# Patient Record
Sex: Female | Born: 1980 | Race: White | Hispanic: No | Marital: Married | Smoking: Never smoker
Health system: Southern US, Community
[De-identification: ages and names within clinical notes are randomized; demographics above are authoritative.]

## PROBLEM LIST (undated history)

## (undated) DIAGNOSIS — R339 Retention of urine, unspecified: Secondary | ICD-10-CM

## (undated) DIAGNOSIS — F32A Depression, unspecified: Secondary | ICD-10-CM

## (undated) DIAGNOSIS — T8859XA Other complications of anesthesia, initial encounter: Secondary | ICD-10-CM

## (undated) DIAGNOSIS — N319 Neuromuscular dysfunction of bladder, unspecified: Secondary | ICD-10-CM

## (undated) DIAGNOSIS — R519 Headache, unspecified: Secondary | ICD-10-CM

## (undated) DIAGNOSIS — H539 Unspecified visual disturbance: Secondary | ICD-10-CM

## (undated) DIAGNOSIS — R42 Dizziness and giddiness: Secondary | ICD-10-CM

## (undated) DIAGNOSIS — R634 Abnormal weight loss: Secondary | ICD-10-CM

## (undated) DIAGNOSIS — N12 Tubulo-interstitial nephritis, not specified as acute or chronic: Secondary | ICD-10-CM

## (undated) DIAGNOSIS — T4145XA Adverse effect of unspecified anesthetic, initial encounter: Secondary | ICD-10-CM

## (undated) DIAGNOSIS — N289 Disorder of kidney and ureter, unspecified: Secondary | ICD-10-CM

## (undated) DIAGNOSIS — G43909 Migraine, unspecified, not intractable, without status migrainosus: Secondary | ICD-10-CM

## (undated) DIAGNOSIS — K909 Intestinal malabsorption, unspecified: Secondary | ICD-10-CM

## (undated) DIAGNOSIS — R51 Headache: Secondary | ICD-10-CM

## (undated) HISTORY — PX: BLADDER REMOVAL: SHX567

## (undated) HISTORY — DX: Headache, unspecified: R51.9

## (undated) HISTORY — DX: Unspecified visual disturbance: H53.9

## (undated) HISTORY — DX: Headache: R51

## (undated) HISTORY — PX: TONSILLECTOMY: SUR1361

## (undated) HISTORY — PX: APPENDECTOMY: SHX54

## (undated) HISTORY — PX: REVISION UROSTOMY CUTANEOUS: SUR1282

## (undated) HISTORY — PX: HIP SURGERY: SHX245

---
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
## 2006-10-25 ENCOUNTER — Inpatient Hospital Stay: Payer: Self-pay

## 2007-03-25 ENCOUNTER — Ambulatory Visit: Payer: Self-pay | Admitting: Unknown Physician Specialty

## 2007-03-26 ENCOUNTER — Ambulatory Visit: Payer: Self-pay | Admitting: Urology

## 2007-08-19 ENCOUNTER — Ambulatory Visit: Payer: Self-pay | Admitting: Urology

## 2008-08-04 ENCOUNTER — Ambulatory Visit: Payer: Self-pay

## 2008-08-31 ENCOUNTER — Observation Stay: Payer: Self-pay

## 2008-09-11 ENCOUNTER — Inpatient Hospital Stay: Payer: Self-pay

## 2009-02-15 ENCOUNTER — Ambulatory Visit: Payer: Self-pay | Admitting: Urology

## 2009-06-21 ENCOUNTER — Ambulatory Visit: Payer: Self-pay | Admitting: Urology

## 2010-06-20 ENCOUNTER — Ambulatory Visit: Payer: Self-pay | Admitting: Urology

## 2012-04-25 ENCOUNTER — Observation Stay: Payer: Self-pay

## 2012-04-27 ENCOUNTER — Inpatient Hospital Stay: Payer: Self-pay

## 2012-04-28 LAB — HEMATOCRIT: HCT: 31.7 % — ABNORMAL LOW (ref 35.0–47.0)

## 2014-08-25 NOTE — H&P (Signed)
L&D Evaluation:  History Expanded:   HPI 34 yo G3P2002, presents at 5843w1d with c/o contractions, EDD of 04/26/12 per 1st trimester US. [facility] notable for early entry to care, [facility] resolved at 26 weeks, elevated 1 hr with normal 3 hr. H/o SVD x 2 at term (10/2006: 7#13 oz, 08/2008: 8 #)    Blood Type (Maternal) A positive    Group B Strep Results Maternal (Result >5wks must be treated as unknown) negative    Maternal HIV Negative    Maternal Syphilis Ab Nonreactive    Maternal Varicella Immune    Rubella Results (Maternal) immune    Maternal T-Dap Immune    Patient's Medical History calculus of kidney, renal colic, UTI    Patient's Surgical History tonsillectomy, knee surgery, laparoscopy    Medications Pre Natal Vitamins  Zantac    Allergies ASA, Advil    Social History none    Family History Non-Contributory   ROS:   ROS see HPI   Exam:   Vital Signs stable    General no apparent distress    Mental Status clear    Chest clear    Heart no murmur/gallop/rubs    Abdomen gravid, tender with contractions    Estimated Fetal Weight Average for gestational age, EFW 7 1/2-8lbs    Edema no edema    Pelvic no external lesions, 3/100/-1, bulging membranes    Mebranes Intact    FHT normal rate with no decels    Ucx regular, q 3-5 minutes    Skin dry   Impression:   Impression early labor   Plan:   Comments Admission for labor Pt desires to go without epidural as prior epidurals did not work well.  Encourage ambulation in hallways, will continue to monitor.   Electronic Signatures: Vella KohlerBrothers, Carlie Corpus K (CNM)  (Signed 11-Jan-14 16:25)  Authored: L&D Evaluation   Last Updated: 11-Jan-14 16:25 by Vella KohlerBrothers, Clydene Burack K (CNM)

## 2015-03-10 DIAGNOSIS — G43909 Migraine, unspecified, not intractable, without status migrainosus: Secondary | ICD-10-CM | POA: Insufficient documentation

## 2015-03-14 DIAGNOSIS — M6281 Muscle weakness (generalized): Secondary | ICD-10-CM | POA: Insufficient documentation

## 2015-03-29 DIAGNOSIS — R197 Diarrhea, unspecified: Secondary | ICD-10-CM | POA: Insufficient documentation

## 2015-03-29 DIAGNOSIS — Z87898 Personal history of other specified conditions: Secondary | ICD-10-CM | POA: Insufficient documentation

## 2015-03-29 DIAGNOSIS — R634 Abnormal weight loss: Secondary | ICD-10-CM | POA: Insufficient documentation

## 2015-04-28 ENCOUNTER — Encounter: Payer: Self-pay | Admitting: Neurology

## 2015-04-28 ENCOUNTER — Ambulatory Visit (INDEPENDENT_AMBULATORY_CARE_PROVIDER_SITE_OTHER): Payer: 59 | Admitting: Neurology

## 2015-04-28 VITALS — BP 116/80 | HR 70 | Resp 12 | Ht 67.0 in | Wt 138.0 lb

## 2015-04-28 DIAGNOSIS — G43909 Migraine, unspecified, not intractable, without status migrainosus: Secondary | ICD-10-CM | POA: Diagnosis not present

## 2015-04-28 DIAGNOSIS — R5383 Other fatigue: Secondary | ICD-10-CM | POA: Diagnosis not present

## 2015-04-28 DIAGNOSIS — G43109 Migraine with aura, not intractable, without status migrainosus: Secondary | ICD-10-CM | POA: Insufficient documentation

## 2015-04-28 DIAGNOSIS — R9089 Other abnormal findings on diagnostic imaging of central nervous system: Secondary | ICD-10-CM | POA: Insufficient documentation

## 2015-04-28 DIAGNOSIS — R930 Abnormal findings on diagnostic imaging of skull and head, not elsewhere classified: Secondary | ICD-10-CM

## 2015-04-28 MED ORDER — VERAPAMIL HCL ER 180 MG PO CP24: 180.0000 mg | ORAL_CAPSULE | Freq: Every day | ORAL | Status: DC

## 2015-04-28 MED ORDER — RIZATRIPTAN BENZOATE 5 MG PO TBDP: 5.0000 mg | ORAL_TABLET | ORAL | Status: DC | PRN

## 2015-04-28 MED ORDER — ONDANSETRON 4 MG PO TBDP: ORAL_TABLET | ORAL | Status: DC

## 2015-04-28 NOTE — Patient Instructions (Signed)
You will be called to schedule the echocardiogram with bubble contrast  We will call you in a few days with the results of the blood work.

## 2015-04-28 NOTE — Progress Notes (Signed)
[facility]  PATIENT: Christine Chavez DOB: 1980-05-30  REFERRING DOCTOR OR PCP:     Dr. Liliane Channel ([place] Fax [phone]) SOURCE:   patient, MRI images on CD and records from [facility].  _________________________________   HISTORICAL  CHIEF COMPLAINT:  Chief Complaint  Patient presents with  . Numbness    Christine Chavez is here with her husband Eddie Dibbles to discuss mri result.  Sts. on 03-05-15 she had sudden onset of heaviness in bilat arms/legs.  Sts. episode lasted for about 45 min.  She then developed h/a, nausea--sought tx. at [facility] in [place].  Sts. she has had migraines for about 8 yrs.  Had never been on a daily med and had only taking otc meds for relief.  Since this episode, she has been started on Imitrex and Fioricet for h/a's.  She had a CT head in the ED to r/o CVA, and sts. that was ok.  She   . Headache    has since had an MRI brain and sts. was told it showed nonspecific changes, and was referred to Neuro to discuss further.  Sts. currently feels fatigue, intermittent sharp stabbing pain bilat upper arms, intermittent dizziness, poor appetitie (has lost 15 lbs).  Has had one episode of numbness from the waist down that last just a few minutes./fim    HISTORY OF PRESENT ILLNESS:  I had the pleasure seeing you patient, Christine Chavez, at [facility] for a neurologic consultation regarding her recent neurologic symptoms and her abnormal MRI. As you know, she is a 35 year old woman in general good health with a long history of occasional migraine headaches.    While at a homeschool lunch, 03/05/15, she had the onset of lightheadedness accompanied by bilateral left > right arm and leg weakness.    Over the next 45 minutes symptoms improved and she was able to walk.    The right side resolved completely and the left was better.  As strength improved, she began to experience nausea and throbbing right sided headache, though the  pain was less intense than typical.    She went to the ED at [facility] and had a CT, EKG and labwork and everything was normal.   Pure set was prescribed to take as needed for migraine.   She followed up with doctors Vaught at [facility] a couple days later and Dr. Marianna Payment a couple weeks later.    She was referred to see a neurologist at [facility] but the wait was long and this referral was made.Marland Kitchen   An MRI was performed and showed many nonspecific foci.   She has a long history of migraine headaches starting 8-9 years ago and occurring 1 - 3 times monthly, usually not associated with her cycle.   The headaches are classic and begin with an aura, usually with altered vision and scintillations in the right visual field. The aura last about 10 minutes and is then followed by a throbbing headache on the right side. If left alone the headache will usually last for 4 or more hours. It is associated with nausea and sometimes vomiting. Moving will make the headache worse. There is some photophobia and mild or phonophobia. Typically when she gets a headache in the past she would take Tylenol but did not find much of a benefit. She has had allergies to ibuprofen and aspirin and does not take NSAIDs.  Duricef did not really help much.  More recently, she was prescribed Imitrex 50  mg when she took that she felt the headache did improve more rapidly. However, she felt a chest pounding shortly after taking the medicine that was very uncomfortable.   Zofran has helped the nausea at times.     Since the episode in November, she has also had GI upset with diarrhea and abdominal cramps. She was prescribed hyoscyamine and that has helped a little bit.   She has lost close to 15 pounds, most of it the week or 2 after the episode.     Additionally, since the episode in November, she has felt very fatigued. The right arm strength improved in under an hour from the onset of her symptoms and the left side slowly improved over  the next day or  2.  However, she continues to feel generally weak and is fatiguing rapidly.    I personally reviewed the MRI images. They show multiple subcortical foci. Most of the foci are very small and round none of them appear to be acute.    REVIEW OF SYSTEMS: Constitutional: No fevers, chills, sweats, or change in appetite.  She reports fatigue and mild weight loss. Eyes: No visual changes, double vision, eye pain Ear, nose and throat: No hearing loss, ear pain, nasal congestion, sore throat Cardiovascular: No chest pain, palpitations Respiratory: No shortness of breath at rest or with exertion.   No wheezes GastrointestinaI: No nausea, vomiting (except with migraine).   She notes diarrhea, abdominal pain.   No  fecal incontinence Genitourinary: No dysuria, urinary retention or frequency.  No nocturia. Musculoskeletal: No neck pain, back pain Integumentary: No rash, pruritus, skin lesions Neurological: as above Psychiatric: No depression at this time.  No anxiety Endocrine: No palpitations, diaphoresis, change in appetite, change in weigh or increased thirst Hematologic/Lymphatic: No anemia, purpura, petechiae. Allergic/Immunologic: No itchy/runny eyes, nasal congestion, recent allergic reactions, rashes  ALLERGIES: Allergies  Allergen Reactions  . Aspirin Shortness Of Breath and Swelling  . Ibuprofen Shortness Of Breath and Swelling    HOME MEDICATIONS:  Current outpatient prescriptions:  .  butalbital-acetaminophen-caffeine (FIORICET, ESGIC) 50-325-40 MG tablet, TAKE 2 TABLETS BY MOUTH EVERY 4 HOURS AS NEEDED, Disp: , Rfl:  .  hyoscyamine (LEVSIN SL) 0.125 MG SL tablet, Place 0.125 mg under the tongue., Disp: , Rfl:  .  magnesium oxide (MAG-OX) 400 MG tablet, Take 400 mg by mouth., Disp: , Rfl:  .  ondansetron (ZOFRAN-ODT) 4 MG disintegrating tablet, , Disp: , Rfl:  .  SUMAtriptan (IMITREX) 50 MG tablet, Take 50 mg by mouth., Disp: , Rfl:   PAST MEDICAL HISTORY: Past Medical History   Diagnosis Date  . Headache   . Vision abnormalities     PAST SURGICAL HISTORY: Past Surgical History  Procedure Laterality Date  . Tonsillectomy      FAMILY HISTORY: Family History  Problem Relation Age of Onset  . Hypertension Mother   . Atrial fibrillation Father   . Healthy Brother   . Arthritis/Rheumatoid Paternal Grandmother   . Healthy Brother     SOCIAL HISTORY:  Social History   Social History  . Marital Status: Married    Spouse Name: N/A  . Number of Children: N/A  . Years of Education: N/A   Occupational History  . Not on file.   Social History Main Topics  . Smoking status: Never Smoker   . Smokeless tobacco: Not on file  . Alcohol Use: No  . Drug Use: No  . Sexual Activity: Not on file   Other Topics  Concern  . Not on file   Social History Narrative  . No narrative on file     PHYSICAL EXAM  Filed Vitals:   04/28/15 0958  BP: 116/80  Pulse: 70  Resp: 12  Height: '5\' 7"'$  (1.702 m)  Weight: 138 lb (62.596 kg)    Body mass index is 21.61 kg/(m^2).   General: The patient is well-developed and well-nourished and in no acute distress  Eyes:  Funduscopic exam shows normal optic discs and retinal vessels.  Neck: The neck is supple, no carotid bruits are noted.  The neck is nontender.  Cardiovascular: The heart has a regular rate and rhythm with a normal S1 and S2. There were no murmurs, gallops or rubs.   Skin: Extremities are without significant edema.  Musculoskeletal:  Back is nontender  Neurologic Exam  Mental status: The patient is alert and oriented x 3 at the time of the examination. The patient has apparent normal recent and remote memory, with an apparently normal attention span and concentration ability.   Speech is normal.  Cranial nerves: Extraocular movements are full. Pupils are equal, round, and reactive to light and accomodation.  Visual fields are full.  Facial symmetry is present. There is good facial sensation to  soft touch bilaterally.Facial strength is normal.  Trapezius and sternocleidomastoid strength is normal. No dysarthria is noted.  The tongue is midline, and the patient has symmetric elevation of the soft palate. No obvious hearing deficits are noted.  Motor:  Muscle bulk is normal.   Tone is normal. Strength is  5 / 5 in all 4 extremities.   Sensory: Sensory testing is intact to pinprick, soft touch and vibration sensation in all 4 extremities.  Coordination: Cerebellar testing reveals good finger-nose-finger and heel-to-shin bilaterally.  Gait and station: Station is normal.   Gait is normal. Tandem gait is normal. Romberg is negative.   Reflexes: Deep tendon reflexes are symmetric and normal bilaterally.   Plantar responses are flexor.    DIAGNOSTIC DATA (LABS, IMAGING, TESTING) - I reviewed patient records, labs, notes, testing and imaging myself where available.      ASSESSMENT AND PLAN  Complicated migraine - Plan: Sedimentation rate, Homocysteine, ECHOCARDIOGRAM COMPLETE  Migraine with aura and without status migrainosus, not intractable  Other fatigue  Abnormal finding on MRI of brain - Plan: Sedimentation rate, Homocysteine, ECHOCARDIOGRAM COMPLETE   In summary, Loreda Tabone is a 35 year old woman who had the onset of left greater than right weakness followed by throbbing head pain that occurred as the weakness improved. Description is fairly typical for a complicated migraine, though usually only one side is involved. The time course with rapid improvement 45 minutes later would be less consistent with multiple sclerosis. Additionally, the MRI of the brain shows nonspecific foci that do not look like MS demyelinating plaques. Because most of the white matter foci are subcortical, they are most likely caused by migraine and could also be caused by cardioemboli.     We will check an echocardiogram with bubble contrast to rule out an atrial septal defect or patent foramen  ovale. Additionally, I'll check blood for ESR and homocysteine to rule out other causes of white matter changes on MRI. I renewed her Zofran as it helps her nausea. Additionally I will have her try Maxalt as some people tolerate it better than Imitrex. She understands that if she has a complicated migraine again that she should not take the Maxalt but that she can take it for her  classic migraine. Additionally, for migraine prophylaxis and competent migraine prophylaxis I will have her start verapamil CR 180 mg daily.  She will return to see me in 3 months or sooner if there are new or worsening neurologic symptoms. She is to call sooner if she has another competent migraine or if she is unable to tolerate the verapamil.  ECHO bubble  Toribio Seiber A. Felecia Shelling, MD, PhD 04/30/6429, 42:76 AM Certified in Neurology, Clinical Neurophysiology, Sleep Medicine, Pain Medicine and Neuroimaging  [facility] [address] [place], [place] [address] [phone]

## 2015-04-29 LAB — HOMOCYSTEINE: Homocysteine: 16.4 umol/L — ABNORMAL HIGH (ref 0.0–15.0)

## 2015-04-29 LAB — SEDIMENTATION RATE: Sed Rate: 2 mm/hr (ref 0–32)

## 2015-04-30 ENCOUNTER — Telehealth: Payer: Self-pay | Admitting: *Deleted

## 2015-04-30 MED ORDER — FA-PYRIDOXINE-CYANOCOBALAMIN 2.5-25-2 MG PO TABS: 1.0000 | ORAL_TABLET | Freq: Every day | ORAL | Status: DC

## 2015-04-30 NOTE — Telephone Encounter (Signed)
I have spoken with Christine Chavez this morning, and per RAS, advised that homocysteine level is a little high--he would like her to start Foltx, one tab daily.  She verbalized understanding of same.  Rx. escribed to [facility] in [place] per her request.  Once echo is done, we will call with that report/fim

## 2015-04-30 NOTE — Telephone Encounter (Signed)
-----   Message from Asa Lenteichard A Sater, MD sent at 04/29/2015  5:43 PM EST ----- Her homocysteine is a little elevated and I would like her to take Foltx 1 by mouth daily. I still want her to get the echocardiogram and we will call with those results after they are forwarded to us.

## 2015-05-04 ENCOUNTER — Telehealth: Payer: Self-pay | Admitting: Neurology

## 2015-05-04 NOTE — Telephone Encounter (Signed)
Patient is calling and would like to know if an eco-cardiogram has been approved for her and would like to schedule. Please call.

## 2015-05-05 NOTE — Telephone Encounter (Signed)
Called and spoke to patient  Her echo is approved for 45 ca lander days patient is aware of day and time approved -06-19-15 auth # [id number].

## 2015-05-05 NOTE — Telephone Encounter (Signed)
Christine Chavez is working on this today.  I have spoken with Dawn--she is aware/fim

## 2015-05-05 NOTE — Telephone Encounter (Signed)
Christine Chavez with [facility] is returning your call.

## 2015-05-05 NOTE — Telephone Encounter (Signed)
noted/fim 

## 2015-05-13 ENCOUNTER — Other Ambulatory Visit: Payer: Self-pay

## 2015-05-13 ENCOUNTER — Ambulatory Visit (HOSPITAL_COMMUNITY): Payer: 59 | Attending: Cardiovascular Disease

## 2015-05-13 DIAGNOSIS — G43909 Migraine, unspecified, not intractable, without status migrainosus: Secondary | ICD-10-CM | POA: Diagnosis not present

## 2015-05-13 DIAGNOSIS — Z8249 Family history of ischemic heart disease and other diseases of the circulatory system: Secondary | ICD-10-CM | POA: Insufficient documentation

## 2015-05-13 DIAGNOSIS — R930 Abnormal findings on diagnostic imaging of skull and head, not elsewhere classified: Secondary | ICD-10-CM

## 2015-05-13 DIAGNOSIS — R9089 Other abnormal findings on diagnostic imaging of central nervous system: Secondary | ICD-10-CM

## 2015-05-13 DIAGNOSIS — G43109 Migraine with aura, not intractable, without status migrainosus: Secondary | ICD-10-CM

## 2015-05-17 ENCOUNTER — Telehealth: Payer: Self-pay | Admitting: *Deleted

## 2015-05-17 NOTE — Telephone Encounter (Signed)
-----   Message from Asa Lente, MD sent at 05/14/2015  4:51 PM EST ----- Please let her know that the echocardiogram was normal.

## 2015-05-17 NOTE — Telephone Encounter (Signed)
I have spoken with Dawn this morning and per RAS, advised that echo was normal.  She verbalized understanding of same, sts. last week was a bad week for h/a's; also she has had more severe episodes of dizziness, shaking, nausea.  Appt. offered this afternoon but she is unable to come in today--appt. given tomorrow at 3pm/fim

## 2015-05-18 ENCOUNTER — Telehealth: Payer: Self-pay | Admitting: *Deleted

## 2015-05-18 ENCOUNTER — Encounter: Payer: Self-pay | Admitting: Neurology

## 2015-05-18 ENCOUNTER — Ambulatory Visit (INDEPENDENT_AMBULATORY_CARE_PROVIDER_SITE_OTHER): Payer: 59 | Admitting: Neurology

## 2015-05-18 ENCOUNTER — Telehealth: Payer: Self-pay | Admitting: Neurology

## 2015-05-18 VITALS — BP 126/86 | HR 68 | Resp 14 | Ht 67.0 in | Wt 138.8 lb

## 2015-05-18 DIAGNOSIS — G43909 Migraine, unspecified, not intractable, without status migrainosus: Secondary | ICD-10-CM | POA: Diagnosis not present

## 2015-05-18 DIAGNOSIS — M542 Cervicalgia: Secondary | ICD-10-CM | POA: Diagnosis not present

## 2015-05-18 DIAGNOSIS — G43109 Migraine with aura, not intractable, without status migrainosus: Secondary | ICD-10-CM

## 2015-05-18 DIAGNOSIS — R42 Dizziness and giddiness: Secondary | ICD-10-CM | POA: Diagnosis not present

## 2015-05-18 DIAGNOSIS — R5383 Other fatigue: Secondary | ICD-10-CM | POA: Diagnosis not present

## 2015-05-18 MED ORDER — NARATRIPTAN HCL 2.5 MG PO TABS: ORAL_TABLET | ORAL | Status: DC

## 2015-05-18 NOTE — Progress Notes (Signed)
[facility]  PATIENT: Christine Chavez DOB: 1980/09/27  REFERRING DOCTOR OR PCP:     Dr. Wanda [facility] Fax [phone]) SOURCE:   patient, MRI images on CD and records from [facility].  _________________________________   HISTORICAL  CHIEF COMPLAINT:  Chief Complaint  Patient presents with  . Headaches    Echo was normal.  Pt. sts. over the last week, h/a's have been more frequent, more severe, not like her typical migraines.  Sts. h/a's are waking her up at night as well as occurring during the day.  Minimal relief with Fioricet.  She has tried Maxalt once and thinks it helped.  She also c/o worsening inermittent dizziness and wonders if Verapamil is causing this/fim  . Dizziness    HISTORY OF PRESENT ILLNESS:  Christine Chavez is a 35 year old woman with a history of classic migraine who had a complicated migraine 02/2015 and continues to have more headaches and more recent dizziness.   health with a long history of occasional migraine headaches.      Currently, she is having more frequent headaches.   The recent headaches are at the vertex and the occiput.   They feel different than her more typical classic migrine headaches.   She does note nausea as the pain intensifies.  Pain and nausea are not always helped by Fioricet (which help typical migraine).   Maxalt helped some.    HA is now daily.     She also is getting episodes of lightheadedness and spinning and feels vision focusing is more difficulty when present.   She does not feel that eyes are jerking.   She is noting more heart palpitations, sometimes with activity and sometimes at random.        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    Over the next 45 minutes symptoms improved and she was able to walk.    The right side resolved completely and the left was  better.  As strength improved, she began to experience nausea and throbbing right sided headache, though the pain was less intense than typical.    She went to the ED at [facility] and had a CT, EKG and labwork and everything was normal.   Pure set was prescribed to take as needed for migraine.   She followed up with doctors Vaught at [facility] a couple days later and Dr. Marchia Bond a couple weeks later.    She was referred to see a neurologist at [facility] but the wait was long and this referral was made.Marland Kitchen   An MRI was performed and showed many nonspecific foci.   Since the episode in November, she has also had GI upset with diarrhea and abdominal cramps. Hyoscyamine has helped a little bit.   She has lost close to 15 pounds,.  Additionally, since the episode in November, she has felt very fatigued.   Classic Migraine:   She has a long history of migraine headaches starting 8-9 years ago and occurring 1 - 3 times monthly, usually not associated with her cycle.   The headaches are classic and begin with an aura of altered vision and right scintillations . Typically, the aura last about 10 minutes and is then followed by a throbbing headache on the right side. If left alone the headache will usually last for 4 or more hours up to 2  days. It is associated with nausea and sometimes vomiting. Moving will make the headache worse. There is some photophobia and mild or phonophobia. Tylenol has not always helped. She has had allergies to ibuprofen and aspirin and does not take NSAIDs.    Imitrex 50 mg helped but she felt a chest pounding shortly after taking the medicine that was very uncomfortable.  She has tried Maxalt once and that was better tolerated. Zofran has helped the nausea at times.     I have personally reviewed the MRI images. They show multiple subcortical foci. Most of the foci are very small and round none of them appear to be acute.    REVIEW OF SYSTEMS: Constitutional: No fevers, chills, sweats, or change in  appetite.  She reports fatigue and mild weight loss. Eyes: No visual changes, double vision, eye pain Ear, nose and throat: No hearing loss, ear pain, nasal congestion, sore throat Cardiovascular: No chest pain, palpitations Respiratory: No shortness of breath at rest or with exertion.   No wheezes GastrointestinaI: No nausea, vomiting (except with migraine).   She notes diarrhea, abdominal pain.   No  fecal incontinence Genitourinary: No dysuria, urinary retention or frequency.  No nocturia. Musculoskeletal: No neck pain, back pain Integumentary: No rash, pruritus, skin lesions Neurological: as above Psychiatric: No depression at this time.  No anxiety Endocrine: No palpitations, diaphoresis, change in appetite, change in weigh or increased thirst Hematologic/Lymphatic: No anemia, purpura, petechiae. Allergic/Immunologic: No itchy/runny eyes, nasal congestion, recent allergic reactions, rashes  ALLERGIES: Allergies  Allergen Reactions  . Aspirin Shortness Of Breath and Swelling  . Ibuprofen Shortness Of Breath and Swelling    HOME MEDICATIONS:  Current outpatient prescriptions:  .  butalbital-acetaminophen-caffeine (FIORICET, ESGIC) 50-325-40 MG tablet, TAKE 2 TABLETS BY MOUTH EVERY 4 HOURS AS NEEDED, Disp: , Rfl:  .  folic acid-pyridoxine-cyancobalamin (FOLTX) 2.5-25-2 MG TABS tablet, Take 1 tablet by mouth daily., Disp: 30 each, Rfl: 11 .  hyoscyamine (LEVSIN SL) 0.125 MG SL tablet, Place 0.125 mg under the tongue., Disp: , Rfl:  .  ondansetron (ZOFRAN-ODT) 4 MG disintegrating tablet, One tablet qd prn nausea, Disp: 20 tablet, Rfl: 3 .  rizatriptan (MAXALT-MLT) 5 MG disintegrating tablet, Take 1 tablet (5 mg total) by mouth as needed for migraine. May repeat in 2 hours if needed, Disp: 10 tablet, Rfl: 5 .  verapamil (VERELAN PM) 180 MG 24 hr capsule, Take 1 capsule (180 mg total) by mouth at bedtime., Disp: 30 capsule, Rfl: 11  PAST MEDICAL HISTORY: Past Medical History    Diagnosis Date  . Headache   . Vision abnormalities     PAST SURGICAL HISTORY: Past Surgical History  Procedure Laterality Date  . Tonsillectomy      FAMILY HISTORY: Family History  Problem Relation Age of Onset  . Hypertension Mother   . Atrial fibrillation Father   . Healthy Brother   . Arthritis/Rheumatoid Paternal Grandmother   . Healthy Brother     SOCIAL HISTORY:  Social History   Social History  . Marital Status: Married    Spouse Name: N/A  . Number of Children: N/A  . Years of Education: N/A   Occupational History  . Not on file.   Social History Main Topics  . Smoking status: Never Smoker   . Smokeless tobacco: Not on file  . Alcohol Use: No  . Drug Use: No  . Sexual Activity: Not on file   Other Topics Concern  . Not on file   Social  History Narrative     PHYSICAL EXAM  Filed Vitals:   05/18/15 1501  BP: 126/86  Pulse: 68  Resp: 14  Height:  (1.702 m)  Weight: 138 lb 12.8 oz (62.959 kg)    Body mass index is 21.73 kg/(m^2).   General: The patient is well-developed and well-nourished and in no acute distress  Eyes:  Funduscopic exam shows normal optic discs and retinal vessels.  Neck:   The neck is moderately tender at the occiput bilaterally and only minimally tender in mid/lower cervical paraspinal muscles.     Neurologic Exam  Mental status: The patient is alert and oriented x 3 at the time of the examination. The patient has apparent normal recent and remote memory, with an apparently normal attention span and concentration ability.   Speech is normal.  Cranial nerves: Extraocular movements are full. Pupils are equal, round, and reactive to light and accomodation.  Visual fields are full.  Facial symmetry is present. There is good facial sensation to soft touch bilaterally.Facial strength is normal.  Trapezius and sternocleidomastoid strength is normal. No dysarthria is noted. No obvious hearing deficits are noted.  Motor:   Muscle bulk is normal.   Tone is normal. Strength is  5 / 5 in all 4 extremities.   Sensory: Sensory testing is intact to pinprick, soft touch and vibration sensation in all 4 extremities.  Coordination: Cerebellar testing reveals good finger-nose-finger and heel-to-shin bilaterally.  Gait and station: Station is normal.   Gait is normal. Tandem gait is normal. Romberg is negative.   Reflexes: Deep tendon reflexes are symmetric and normal bilaterally.   Plantar responses are flexor.    DIAGNOSTIC DATA (LABS, IMAGING, TESTING) - I reviewed patient records, labs, notes, testing and imaging myself where available.      ASSESSMENT AND PLAN  Complicated migraine  Migraine with aura and without status migrainosus, not intractable  Other fatigue  Dizziness  Neck pain    1.   Her current headache is more typical for occipital neuralgia / transformed migraine and she has upper neck/occiput tenderness.   This may be perpetuating her headache.   Bilateral splenius capitis muscle trigger point injections with 80 mg Depo-Medrol in Marcaine were performed using sterile technique.   She did not note a benefit from the injection and felt some tenderness after the shot.    After discharge, her friend came to the front desk stating that she was feeling worse.    However, she wanted to go home and did not stay to be re-evaluated.  2.   Naratriptan bid x 4 days. 3.   If not better by Monday, she will call back and I will order a Holter monitor to further evaluate the dizziness. 4.   She will return to see me in 3 months or sooner if there are new or worsening neurologic symptoms.   Richard A. Epimenio Foot, MD, PhD 05/18/2015, 3:23 PM Certified in Neurology, Clinical Neurophysiology, Sleep Medicine, Pain Medicine and Neuroimaging  [facility] [address], [address] [place], [place] [address] [phone]

## 2015-05-18 NOTE — Telephone Encounter (Signed)
I have spoken with Christine Chavez this afternoon--she reports when leaving our office this afternoon she began to feel more dizzy, nauseous.  Sts. nausea has not subsided yet.  She is at the pharmacy having rx's filled--sts. will go home and take Zofran, see if this helps nausea./fim

## 2015-05-18 NOTE — Patient Instructions (Signed)
Take naratriptan twice a day until done (4 days) If you're not better by Monday, call us and we will check a Holter monitor which is an extended EKG

## 2015-05-19 ENCOUNTER — Encounter: Payer: Self-pay | Admitting: *Deleted

## 2015-05-20 ENCOUNTER — Telehealth: Payer: Self-pay | Admitting: Neurology

## 2015-05-20 NOTE — Telephone Encounter (Signed)
Pt called said she went to pharmacy today to pick up naratriptan (AMERGE) 2.5 MG tablet and she was told they "have nothing ready for her". She told pharmacy RN relayed that everything has been taken care of and it was ready to be picked up pharmacy and insurance would pay for it. Please call

## 2015-05-20 NOTE — Telephone Encounter (Signed)
Pt called back said pharmacy has contacted her and med is there. No need to call her

## 2015-05-20 NOTE — Telephone Encounter (Signed)
Note created in error.

## 2015-05-24 ENCOUNTER — Telehealth: Payer: Self-pay | Admitting: Neurology

## 2015-05-24 MED ORDER — MECLIZINE HCL 12.5 MG PO TABS: 12.5000 mg | ORAL_TABLET | Freq: Three times a day (TID) | ORAL | Status: DC | PRN

## 2015-05-24 NOTE — Telephone Encounter (Signed)
Patient is calling and states that after the nerve block last week she is still having dizziness and nausea and headaches but not as severe.  Please call.

## 2015-05-24 NOTE — Telephone Encounter (Signed)
I have spoken with Christine Chavez this afternoon.  She reports dizziness is no better. Per RAS, I have offered Meclizine 12.5mg  po tid, and if no better in one week, will refer to ENT.  She is agreeable with this plan.  Rx. escribed to [facility] in [place]/fim

## 2015-06-04 DIAGNOSIS — R109 Unspecified abdominal pain: Secondary | ICD-10-CM | POA: Insufficient documentation

## 2015-06-13 ENCOUNTER — Encounter (HOSPITAL_COMMUNITY): Payer: Self-pay | Admitting: Emergency Medicine

## 2015-06-13 ENCOUNTER — Emergency Department (HOSPITAL_COMMUNITY)
Admission: EM | Admit: 2015-06-13 | Discharge: 2015-06-14 | Disposition: A | Discharge status: Home / Self Care | Payer: 59 | Attending: Emergency Medicine | Admitting: Emergency Medicine

## 2015-06-13 DIAGNOSIS — R51 Headache: Secondary | ICD-10-CM | POA: Insufficient documentation

## 2015-06-13 DIAGNOSIS — R531 Weakness: Secondary | ICD-10-CM | POA: Insufficient documentation

## 2015-06-13 DIAGNOSIS — Z79899 Other long term (current) drug therapy: Secondary | ICD-10-CM | POA: Diagnosis not present

## 2015-06-13 DIAGNOSIS — Z8669 Personal history of other diseases of the nervous system and sense organs: Secondary | ICD-10-CM | POA: Insufficient documentation

## 2015-06-13 DIAGNOSIS — R339 Retention of urine, unspecified: Secondary | ICD-10-CM

## 2015-06-13 DIAGNOSIS — R29898 Other symptoms and signs involving the musculoskeletal system: Secondary | ICD-10-CM | POA: Diagnosis not present

## 2015-06-13 DIAGNOSIS — Z3202 Encounter for pregnancy test, result negative: Secondary | ICD-10-CM | POA: Diagnosis not present

## 2015-06-13 LAB — CBC WITH DIFFERENTIAL/PLATELET
Basophils Absolute: 0 10*3/uL (ref 0.0–0.1)
Basophils Relative: 1 %
Eosinophils Absolute: 0.2 10*3/uL (ref 0.0–0.7)
Eosinophils Relative: 2 %
HCT: 41.8 % (ref 36.0–46.0)
Hemoglobin: 14.6 g/dL (ref 12.0–15.0)
Lymphocytes Relative: 33 %
Lymphs Abs: 2.4 10*3/uL (ref 0.7–4.0)
MCH: 31.8 pg (ref 26.0–34.0)
MCHC: 34.9 g/dL (ref 30.0–36.0)
MCV: 91.1 fL (ref 78.0–100.0)
Monocytes Absolute: 0.6 10*3/uL (ref 0.1–1.0)
Monocytes Relative: 8 %
Neutro Abs: 4.1 10*3/uL (ref 1.7–7.7)
Neutrophils Relative %: 56 %
Platelets: 286 10*3/uL (ref 150–400)
RBC: 4.59 MIL/uL (ref 3.87–5.11)
RDW: 13.1 % (ref 11.5–15.5)
WBC: 7.2 10*3/uL (ref 4.0–10.5)

## 2015-06-13 MED ORDER — SODIUM CHLORIDE 0.9 % IV BOLUS (SEPSIS)
1000.0000 mL | Freq: Once | INTRAVENOUS | Status: AC
Start: 2015-06-13 — End: 2015-06-14
  Administered 2015-06-13: 1000 mL via INTRAVENOUS

## 2015-06-13 NOTE — ED Notes (Signed)
Pt from home for eval of worsening generalized weakness and headache since being released from [facility] on Friday. Pt also unable to void, has had to be cathed 3 times within 48 hours by family friend that is a Engineer, civil (consulting). Family reports increased lethargy as well. Pt lethargic in triage but oriented x4. Denies any fevers.

## 2015-06-13 NOTE — ED Provider Notes (Signed)
CSN: [id number]     Arrival date & time 06/13/15  2204 History  By signing my name below, I, Budd Palmer, attest that this documentation has been prepared under the direction and in the presence of Gilda Crease, MD. Electronically Signed: Budd Palmer, ED Scribe. 06/13/2015. 11:41 PM.    Chief Complaint  Patient presents with  . Weakness  . Urinary Retention   The history is provided by the patient, a parent and a friend. No language interpreter was used.   HPI Comments: Christine Chavez is a 35 y.o. female with a PMHx of headache who presents to the Emergency Department complaining of chronic, worsening, generalized weakness onset 4 months ago, and urinary retention onset 3 days ago. She notes this began after being discharged from an 11 day stay at [facility]. She reports associated headache. She has not urinated regularly, once 3 days ago, then again yesterday with the help of a catheter. She states she had another catheter done at 2 PM today, but was still not able to urinate. She notes she has had a brain MRI and a head CT done with unidentified masses in her brain. She notes her periods have been dark, nearly black blood. She states she has never been regular in her timing. She notes the GYN told her this may be from the trauma her body has gone through. Per mom, pt began feeling ill in November 2016. She states one day after having a headache, pt's lower body just felt numb. She notes pt has been growing progressively weaker ever since. Per friend, pt lost all sensation in her legs and had tingling in her hands. He states that pt was seen at [facility] for this where pt had a spinal MRI done. He notes pt was admitted and had a spinal tap done to test for meningitis. Pt was then discharged home. He states she then had a severe headache and was admitted to the hospital again, this time for 11 days, being discharged once more 3 days ago. He states that nobody was able to diagnose  her illness and that they were told "medicine had failed her." He notes pt has not been able to eat much and has been drinking very little. He notes pt has lost a large amount of weight (15-20 lbs) and uses a walker at home.   Past Medical History  Diagnosis Date  . Headache   . Vision abnormalities    Past Surgical History  Procedure Laterality Date  . Tonsillectomy     Family History  Problem Relation Age of Onset  . Hypertension Mother   . Atrial fibrillation Father   . Healthy Brother   . Arthritis/Rheumatoid Paternal Grandmother   . Healthy Brother    Social History  Substance Use Topics  . Smoking status: Never Smoker   . Smokeless tobacco: None  . Alcohol Use: No   OB History    No data available     Review of Systems  Genitourinary: Positive for decreased urine volume and difficulty urinating.  Neurological: Positive for weakness and headaches.  All other systems reviewed and are negative.   Allergies  Aspirin and Ibuprofen  Home Medications   Prior to Admission medications   Medication Sig Start Date End Date Taking? Authorizing Provider  butalbital-acetaminophen-caffeine (FIORICET, ESGIC) 50-325-40 MG tablet TAKE 2 TABLETS BY MOUTH EVERY 4 HOURS AS NEEDED 04/22/15  Yes Historical Provider, MD  famotidine (PEPCID) 20 MG tablet Take 20 mg by mouth  2 (two) times daily. 06/11/15 07/11/15 Yes Historical Provider, MD  folic acid-pyridoxine-cyancobalamin (FOLTX) 2.5-25-2 MG TABS tablet Take 1 tablet by mouth daily. 04/30/15  Yes Asa Lente, MD  gabapentin (NEURONTIN) 300 MG capsule Take 900 mg by mouth at bedtime. 06/11/15 07/11/15 Yes Historical Provider, MD  mirtazapine (REMERON) 15 MG tablet Take 15 mg by mouth every evening. 06/11/15 07/11/15 Yes Historical Provider, MD  ondansetron (ZOFRAN) 4 MG tablet Take 4 mg by mouth every 8 (eight) hours as needed for nausea or vomiting.    Yes Historical Provider, MD  promethazine (PHENERGAN) 12.5 MG tablet Take 12.5 mg by  mouth every 12 (twelve) hours as needed for nausea or vomiting.  06/11/15 06/16/15 Yes Historical Provider, MD  rizatriptan (MAXALT-MLT) 5 MG disintegrating tablet Take 1 tablet (5 mg total) by mouth as needed for migraine. May repeat in 2 hours if needed 04/28/15  Yes Asa Lente, MD  sertraline (ZOLOFT) 25 MG tablet Take 25 mg by mouth daily. 06/01/15 05/31/16 Yes Historical Provider, MD  SUMAtriptan (IMITREX) 50 MG tablet Take 50 mg by mouth every 2 (two) hours as needed. 03/08/15  Yes Historical Provider, MD  tamsulosin (FLOMAX) 0.4 MG CAPS capsule Take 0.4 mg by mouth daily. 06/13/15 06/18/15 Yes Historical Provider, MD  verapamil (VERELAN PM) 180 MG 24 hr capsule Take 1 capsule (180 mg total) by mouth at bedtime. 04/28/15  Yes Asa Lente, MD  meclizine (ANTIVERT) 12.5 MG tablet Take 1 tablet (12.5 mg total) by mouth 3 (three) times daily as needed for dizziness. Patient not taking: Reported on 06/13/2015 05/24/15   Asa Lente, MD  naratriptan (AMERGE) 2.5 MG tablet Take one (1) tablet bid x 4 days. Patient not taking: Reported on 06/13/2015 05/18/15   Asa Lente, MD  ondansetron (ZOFRAN-ODT) 4 MG disintegrating tablet One tablet qd prn nausea Patient not taking: Reported on 06/13/2015 04/28/15   Asa Lente, MD   BP 105/75 mmHg  Pulse 73  Temp(Src) 98.2 F (36.8 C) (Oral)  Resp 14  SpO2 98%  LMP 05/11/2015 Physical Exam  Constitutional: She is oriented to person, place, and time. She appears well-developed and well-nourished. No distress.  HENT:  Head: Normocephalic and atraumatic.  Right Ear: Hearing normal.  Left Ear: Hearing normal.  Nose: Nose normal.  Mouth/Throat: Oropharynx is clear and moist and mucous membranes are normal.  Eyes: Conjunctivae and EOM are normal. Pupils are equal, round, and reactive to light.  Neck: Normal range of motion. Neck supple.  Cardiovascular: Regular rhythm, S1 normal and S2 normal.  Exam reveals no gallop and no friction rub.   No murmur  heard. Pulmonary/Chest: Effort normal and breath sounds normal. No respiratory distress. She exhibits no tenderness.  Abdominal: Soft. Normal appearance and bowel sounds are normal. There is no hepatosplenomegaly. There is no tenderness. There is no rebound, no guarding, no tenderness at McBurney's point and negative Murphy's sign. No hernia.  Musculoskeletal: Normal range of motion.  Neurological: She is alert and oriented to person, place, and time. She has normal strength. No cranial nerve deficit or sensory deficit. Coordination normal. GCS eye subscore is 4. GCS verbal subscore is 5. GCS motor subscore is 6.  Generalized weakness and poor effort lifting legs against gravity, weakness is symmetical  Skin: Skin is warm, dry and intact. No rash noted. No cyanosis.  Psychiatric: She has a normal mood and affect. Her speech is normal and behavior is normal. Thought content normal.  Nursing note and  vitals reviewed.   ED Course  Procedures  DIAGNOSTIC STUDIES: Oxygen Saturation is 99% on RA, normal by my interpretation.    COORDINATION OF CARE: 11:17 PM - Discussed plans to review medical records, as well as to order diagnostic studies and IV fluids. Will consult with a neurologist. Pt advised of plan for treatment and pt agrees.  Labs Review Labs Reviewed  URINALYSIS, ROUTINE W REFLEX MICROSCOPIC (NOT AT [facility]) - Abnormal; Notable for the following:    Color, Urine ORANGE (*)    APPearance CLOUDY (*)    Hgb urine dipstick SMALL (*)    Ketones, ur 15 (*)    Nitrite POSITIVE (*)    Leukocytes, UA SMALL (*)    All other components within normal limits  BASIC METABOLIC PANEL - Abnormal; Notable for the following:    CO2 18 (*)    All other components within normal limits  URINE MICROSCOPIC-ADD ON - Abnormal; Notable for the following:    Squamous Epithelial / LPF 0-5 (*)    Bacteria, UA RARE (*)    All other components within normal limits  CBC WITH DIFFERENTIAL/PLATELET  POC URINE  PREG, ED    Imaging Review No results found. I have personally reviewed and evaluated these images and lab results as part of my medical decision-making.   EKG Interpretation None      MDM   Final diagnoses:  Urinary retention  Urinary retention    Presents to the emergency department for evaluation of acute urinary retention. Patient reports that she has not been able to urinate over the last 24-36 hours. A family friend who is a nurse straight cathed her yesterday and had nearly a liter of urine in her bladder. She has been unable to urinate again today, presents for further evaluation.  Patient has a complicated recent history. She suffered a migraine that was felt to be a complex migraine in November. At that time she was having multiple neurologic features. Since that time she has not fully recovered. She has had weakness of her legs, numbness and tingling in her extremities. She has had frequent nausea and inability to eat and drink. She has suffered generalized weakness. She was admitted to [facility] for an extended period of time and had significant workup. Of note, records from [facility] hospitalization do report complaints of difficulty with urination during her stay. This does not appear to be completely acute.  Examination reveals symmetric decreased strength in extremities, legs greater than arms. She has normal coordination, however. There is some evidence of poor effort with exam. Workup from [facility] has been reviewed. She had a head MRI performed in November when she had her original headache that showed nonspecific lesions. There was concern for possible MS. She underwent lumbar puncture this past week, however, and there was no evidence of abnormality that would be consistent with MS.  Case discussed with Dr. Roseanne Reno, on call for neurology. He recommended MRI. MRI of thoracic spine and lumbar spine were performed. No lesions are noted. At this time etiology of her symptoms is  unclear. She will be discharged with a leg bag, follow-up with urology within 1 week for removal. She was placed empirically on Cipro. She has follow-up with primary care today and also follow-up with neurology already scheduled.  I personally performed the services described in this documentation, which was scribed in my presence. The recorded information has been reviewed and is accurate.   Gilda Crease, MD 06/14/15 [phone]

## 2015-06-13 NOTE — ED Notes (Signed)
MD at bedside. 

## 2015-06-14 ENCOUNTER — Emergency Department (HOSPITAL_COMMUNITY): Payer: 59

## 2015-06-14 DIAGNOSIS — R29898 Other symptoms and signs involving the musculoskeletal system: Secondary | ICD-10-CM | POA: Diagnosis not present

## 2015-06-14 DIAGNOSIS — R339 Retention of urine, unspecified: Secondary | ICD-10-CM

## 2015-06-14 LAB — URINALYSIS, ROUTINE W REFLEX MICROSCOPIC
Bilirubin Urine: NEGATIVE
Glucose, UA: NEGATIVE mg/dL
Ketones, ur: 15 mg/dL — AB
Nitrite: POSITIVE — AB
Protein, ur: NEGATIVE mg/dL
Specific Gravity, Urine: 1.013 (ref 1.005–1.030)
pH: 6 (ref 5.0–8.0)

## 2015-06-14 LAB — BASIC METABOLIC PANEL
Anion gap: 14 (ref 5–15)
BUN: 8 mg/dL (ref 6–20)
CO2: 18 mmol/L — ABNORMAL LOW (ref 22–32)
Calcium: 9.5 mg/dL (ref 8.9–10.3)
Chloride: 106 mmol/L (ref 101–111)
Creatinine, Ser: 0.82 mg/dL (ref 0.44–1.00)
GFR calc Af Amer: >60 mL/min (ref >60)
GFR calc non Af Amer: >60 mL/min (ref >60)
Glucose, Bld: 92 mg/dL (ref 65–99)
Potassium: 4.1 mmol/L (ref 3.5–5.1)
Sodium: 138 mmol/L (ref 135–145)

## 2015-06-14 LAB — URINE MICROSCOPIC-ADD ON

## 2015-06-14 LAB — POC URINE PREG, ED: Preg Test, Ur: NEGATIVE

## 2015-06-14 MED ORDER — GADOBENATE DIMEGLUMINE 529 MG/ML IV SOLN
10.0000 mL | Freq: Once | INTRAVENOUS | Status: AC | PRN
  Administered 2015-06-14: 10 mL via INTRAVENOUS

## 2015-06-14 MED ORDER — LORAZEPAM 2 MG/ML IJ SOLN
INTRAMUSCULAR | Status: AC
  Filled 2015-06-14: qty 1

## 2015-06-14 MED ORDER — CIPROFLOXACIN HCL 500 MG PO TABS: 500.0000 mg | ORAL_TABLET | Freq: Two times a day (BID) | ORAL | Status: DC

## 2015-06-14 MED ORDER — CIPROFLOXACIN HCL 500 MG PO TABS
500.0000 mg | ORAL_TABLET | Freq: Once | ORAL | Status: AC
  Administered 2015-06-14: 500 mg via ORAL
  Filled 2015-06-14: qty 1

## 2015-06-14 MED ORDER — LORAZEPAM 2 MG/ML IJ SOLN
1.0000 mg | Freq: Once | INTRAMUSCULAR | Status: AC
  Administered 2015-06-14: 1 mg via INTRAVENOUS

## 2015-06-14 NOTE — Consult Note (Signed)
Admission H&P    Chief Complaint: weakness and urinary retention.  HPI: Christine Chavez is an 35 y.o. female with a history of headaches, presenting with complaint of weakness which has been present been fluctuated in severity over the past 4 months, as well as urinary retention. She was discharged from [facility] on 06/11/2015 following an 11 day stayfor headache as well as weakness particularly of the lower extremities area just had a spinal tap and subsequently developed post-LP headache for which she was given a blood patch. She also developed problems with urinary retention during her hospital stay, which were thought to be related to medication. She had been given Levsin as well as Phenergan for GI symptoms.she's had an MRI of her brain which showed nonspecific T2-weighted abnormalities, thought to be related to a history of migraine headaches. She is also had an MRI of her neck which was unremarkable. This was done when she presented at [facility] with worsening of leg weakness as well as spread of weakness to upper extremities. She has had no clear objective clinical deficit nor change on imaging study demonstrated so far.urinalysis here showed cloudy urine that was positive for nitrites and ketones. Microscopic exam showed rare bacteria..Electrolytes were unremarkable.  Past Medical History  Diagnosis Date  . Headache   . Vision abnormalities     Past Surgical History  Procedure Laterality Date  . Tonsillectomy      Family History  Problem Relation Age of Onset  . Hypertension Mother   . Atrial fibrillation Father   . Healthy Brother   . Arthritis/Rheumatoid Paternal Grandmother   . Healthy Brother    Social History:  reports that she has never smoked. She does not have any smokeless tobacco history on file. She reports that she does not drink alcohol or use illicit drugs.  Allergies:  Allergies  Allergen Reactions  . Aspirin Shortness Of Breath and Swelling  . Ibuprofen Shortness Of  Breath and Swelling    Medications: Outpatient medications were reviewed by me.  ROS: History obtained from spouse and the patient  General ROS: 15-20 pound weight loss over the last several months Psychological ROS: negative for - behavioral disorder, hallucinations, memory difficulties, mood swings or suicidal ideation Ophthalmic ROS: negative for - blurry vision, double vision, eye pain or loss of vision ENT ROS: negative for - epistaxis, nasal discharge, oral lesions, sore throat, tinnitus or vertigo Allergy and Immunology ROS: negative for - hives or itchy/watery eyes Hematological and Lymphatic ROS: negative for - bleeding problems, bruising or swollen lymph nodes Endocrine ROS: negative for - galactorrhea, hair pattern changes, polydipsia/polyuria or temperature intolerance Respiratory ROS: negative for - cough, hemoptysis, shortness of breath or wheezing Cardiovascular ROS: negative for - chest pain, dyspnea on exertion, edema or irregular heartbeat Gastrointestinal ROS: as noted in history of present illness Genito-Urinary ROS: negative for - dysuria, hematuria, incontinence or urinary frequency/urgency Musculoskeletal ROS: negative for - joint swelling or muscular weakness Neurological ROS: as noted in HPI Dermatological ROS: negative for rash and skin lesion changes  Physical Examination: Blood pressure 113/78, pulse 72, temperature 98.2 F (36.8 C), temperature source Oral, resp. rate 14, last menstrual period 05/11/2015, SpO2 99 %.  HEENT-  Normocephalic, no lesions, without obvious abnormality.  Normal external eye and conjunctiva.  Normal TM's bilaterally.  Normal auditory canals and external ears. Normal external nose, mucus membranes and septum.  Normal pharynx. Neck supple with no masses, nodes, nodules or enlargement. Cardiovascular - regular rate and rhythm, S1, S2  normal, no murmur, click, rub or gallop Lungs - chest clear, no wheezing, rales, normal symmetric air  entry, Heart exam - S1, S2 normal, no murmur, no gallop, rate regular Extremities - no joint deformities, effusion, or inflammation and no edema  Neurologic Examination: Mental Status: Alert, oriented, no acute distress, flat affect.  Speech fluent without evidence of aphasia. Able to follow commands without difficulty. Cranial Nerves: II-Visual fields were normal. III/IV/VI-Pupils were equal and reacted normally to light. Extraocular movements were full and conjugate.    V/VII-no facial numbness and no facial weakness. VIII-normal. X-normal speech and symmetrical palatal movement. XI: trapezius strength/neck flexion strength normal bilaterally XII-midline tongue extension with normal strength. Motor: normal strength throughout. Effort for the most part was fairly poor, however. Sensory: Normal throughout. Deep Tendon Reflexes: 2+ and symmetric. Plantars: Flexor bilaterally Cerebellar: Normal finger-to-nose testing. Carotid auscultation: Normal  Results for orders placed or performed during the hospital encounter of 06/13/15 (from the past 48 hour(s))  CBC with Differential     Status: None   Collection Time: 06/13/15 10:50 PM  Result Value Ref Range   WBC 7.2 4.0 - 10.5 K/uL   RBC 4.59 3.87 - 5.11 MIL/uL   Hemoglobin 14.6 12.0 - 15.0 g/dL   HCT 41.8 36.0 - 46.0 %   MCV 91.1 78.0 - 100.0 fL   MCH 31.8 26.0 - 34.0 pg   MCHC 34.9 30.0 - 36.0 g/dL   RDW 13.1 11.5 - 15.5 %   Platelets 286 150 - 400 K/uL   Neutrophils Relative % 56 %   Neutro Abs 4.1 1.7 - 7.7 K/uL   Lymphocytes Relative 33 %   Lymphs Abs 2.4 0.7 - 4.0 K/uL   Monocytes Relative 8 %   Monocytes Absolute 0.6 0.1 - 1.0 K/uL   Eosinophils Relative 2 %   Eosinophils Absolute 0.2 0.0 - 0.7 K/uL   Basophils Relative 1 %   Basophils Absolute 0.0 0.0 - 0.1 K/uL  Basic metabolic panel     Status: Abnormal   Collection Time: 06/13/15 10:50 PM  Result Value Ref Range   Sodium 138 135 - 145 mmol/L   Potassium 4.1 3.5 -  5.1 mmol/L   Chloride 106 101 - 111 mmol/L   CO2 18 (L) 22 - 32 mmol/L   Glucose, Bld 92 65 - 99 mg/dL   BUN 8 6 - 20 mg/dL   Creatinine, Ser 0.82 0.44 - 1.00 mg/dL   Calcium 9.5 8.9 - 10.3 mg/dL   GFR calc non Af Amer >60 >60 mL/min   GFR calc Af Amer >60 >60 mL/min    Comment: (NOTE) The eGFR has been calculated using the CKD EPI equation. This calculation has not been validated in all clinical situations. eGFR's persistently <60 mL/min signify possible Chronic Kidney Disease.    Anion gap 14 5 - 15  Urinalysis, Routine w reflex microscopic (not at [facility])     Status: Abnormal   Collection Time: 06/13/15 11:41 PM  Result Value Ref Range   Color, Urine ORANGE (A) YELLOW    Comment: BIOCHEMICALS MAY BE AFFECTED BY COLOR   APPearance CLOUDY (A) CLEAR   Specific Gravity, Urine 1.013 1.005 - 1.030   pH 6.0 5.0 - 8.0   Glucose, UA NEGATIVE NEGATIVE mg/dL   Hgb urine dipstick SMALL (A) NEGATIVE   Bilirubin Urine NEGATIVE NEGATIVE   Ketones, ur 15 (A) NEGATIVE mg/dL   Protein, ur NEGATIVE NEGATIVE mg/dL   Nitrite POSITIVE (A) NEGATIVE   Leukocytes,  UA SMALL (A) NEGATIVE  Urine microscopic-add on     Status: Abnormal   Collection Time: 06/13/15 11:41 PM  Result Value Ref Range   Squamous Epithelial / LPF 0-5 (A) NONE SEEN   WBC, UA 0-5 0 - 5 WBC/hpf   RBC / HPF 0-5 0 - 5 RBC/hpf   Bacteria, UA RARE (A) NONE SEEN   Urine-Other MUCOUS PRESENT     Comment: LESS THAN 10 mL OF URINE SUBMITTED MICROSCOPIC EXAM PERFORMED ON UNCONCENTRATED URINE    No results found.  Assessment/Plan 35 year old lady with multiple complaints, including chronic headaches as well as complaints of weakness involving extremities, primarily lower extremities, and urinary retention. Etiology is unclear. Exam shows no focal deficits clinically. She's had MRI of her brain showed no indications of multiple sclerosis. Lumbar puncture results are pending. Significance of finding on urinalysis is  unclear.  Recommendations: 1. MRI of thoracic and lumbar spine without contrast 2. Further intervention if MRI studies show evidence of thoracic cord or cauda equina abnormality 3. Otherwise no further neurological intervention is indicated acutely 4. Follow-up with primary physician at [facility]. Patient has an appointment for later today 06/14/2015. 5. Follow-up appointment with allergist at [facility] on 07/21/2015 6. Recommend she be referred for outpatient physical therapy by her primary physician   C.R. Nicole Kindred, MD [facility] [phone]  06/14/2015, 1:57 AM

## 2015-06-14 NOTE — ED Notes (Signed)
MD at bedside. 

## 2015-06-14 NOTE — ED Notes (Signed)
Stewart MD at bedside. 

## 2015-06-14 NOTE — ED Notes (Signed)
Patient left at this time with all belongings. 

## 2015-06-14 NOTE — ED Notes (Signed)
  ativan wasted in sink with Scientific laboratory technician and Consulting civil engineer

## 2015-06-14 NOTE — ED Notes (Signed)
UPREG NEGATIVE - hasn't crossed over into epic yet

## 2015-06-14 NOTE — Discharge Instructions (Signed)
Acute Urinary Retention, Female Acute urinary retention is the temporary inability to urinate. This is an uncommon problem in women. It can be caused by:  Infection.  A side effect of a medicine.  A problem in a nearby organ that presses or squeezes on the bladder or the urethra (the tube that drains the bladder).  Psychological problems.   Surgery on your bladder, urethra, or pelvic organs that causes obstruction to the outflow of urine from your bladder. HOME CARE INSTRUCTIONS  If you are sent home with a Foley catheter and a drainage system, you will need to discuss the best course of action with your health care provider. While the catheter is in, maintain a good intake of fluids. Keep the drainage bag emptied and lower than your catheter. This is so that contaminated urine will not flow back into your bladder, which could lead to a urinary tract infection. There are two main types of drainage bags. One is a large bag that usually is used at night. It has a good capacity that will allow you to sleep through the night without having to empty it. The second type is called a leg bag. It has a smaller capacity so it needs to be emptied more frequently. However, the main advantage is that it can be attached by a leg strap and goes underneath your clothing, allowing you the freedom to move about or leave your home. Only take over-the-counter or prescription medicines for pain, discomfort, or fever as directed by your health care provider.  SEEK MEDICAL CARE IF:  You develop a low-grade fever.  You experience spasms or leakage of urine with the spasms. SEEK IMMEDIATE MEDICAL CARE IF:   You develop chills or fever.  Your catheter stops draining urine.  Your catheter falls out.  You start to develop increased bleeding that does not respond to rest and increased fluid intake. MAKE SURE YOU:  Understand these instructions.  Will watch your condition.  Will get help right away if you are  not doing well or get worse.   This information is not intended to replace advice given to you by your health care provider. Make sure you discuss any questions you have with your health care provider.   Document Released: 04/02/2006 Document Revised: 08/18/2014 Document Reviewed: 09/12/2012 Elsevier Interactive Patient Education 2016 [employer]. Foley

## 2015-06-17 ENCOUNTER — Encounter: Payer: Self-pay | Admitting: *Deleted

## 2015-06-18 ENCOUNTER — Ambulatory Visit (INDEPENDENT_AMBULATORY_CARE_PROVIDER_SITE_OTHER): Payer: 59 | Admitting: Obstetrics and Gynecology

## 2015-06-18 ENCOUNTER — Encounter: Payer: Self-pay | Admitting: Obstetrics and Gynecology

## 2015-06-18 VITALS — BP 113/81 | HR 78 | Resp 16 | Ht 67.0 in | Wt 129.6 lb

## 2015-06-18 DIAGNOSIS — N200 Calculus of kidney: Secondary | ICD-10-CM

## 2015-06-18 DIAGNOSIS — R339 Retention of urine, unspecified: Secondary | ICD-10-CM | POA: Diagnosis not present

## 2015-06-18 NOTE — Progress Notes (Signed)
06/18/2015 1:29 PM   Christine Chavez 1980/05/05 [id number]  Referring provider: Maisie Fus, MD [address] [place], [place] [address]  Chief Complaint  Patient presents with  . Urinary Retention    voiding trial    HPI: Patient is a 35 year old female with a recent history of new onset neurologic symptoms including severe headaches, upper and lower extremity weakness and most recently the urinary retention.  She reports that she was hospitalized at [facility] for upon approximately 11 days. Her neurologic workup has been negative thus far. Testing for multiple sclerosis has been negative. She was experiencing difficulty voiding prior to discharge which was attributed to her change in multiple medications. She continued to experience difficulty urinating and had a friend who is a nurse come over and perform a urinary catheterization which resulted in approximately a liter urine output. She then was seen in the emergency department and a Foley catheter was placed approximately 1 week ago. She reports that she has not taken any medications over the last week. She presents today for voiding trial. She continues to experience weakness and severe headaches.  Last UA 06/09/15 +nitrites rare bacteria otherwise unremarkable UC >100,000 Coag negative staph species.  Treated with Cipro by ED physician.  CT A/P with contrast 06/02/15 ordered by patient's PCP demonstrated No acute findings in the abdomen or pelvis. Nonobstructing 3 mm bilateral renal calculi.  Images not available for review.  H/o of kidney stones and occasional stress incontinence since giving birth to her children. Otherwise no significant baseline urinary symptoms.   PMH: Past Medical History  Diagnosis Date  . Headache   . Vision abnormalities     Surgical History: Past Surgical History  Procedure Laterality Date  . Tonsillectomy    . Anterior cruciate ligament repair  1997  . Exploratory laparotomy  1999     Home Medications:    Medication List       This list is accurate as of: 06/18/15  1:29 PM.  Always use your most recent med list.               butalbital-acetaminophen-caffeine 50-325-40 MG tablet  Commonly known as:  FIORICET, ESGIC  TAKE 2 TABLETS BY MOUTH EVERY 4 HOURS AS NEEDED     ciprofloxacin 500 MG tablet  Commonly known as:  CIPRO  Take 1 tablet (500 mg total) by mouth 2 (two) times daily.     famotidine 20 MG tablet  Commonly known as:  PEPCID  Take 20 mg by mouth 2 (two) times daily.     gabapentin 300 MG capsule  Commonly known as:  NEURONTIN  Take 900 mg by mouth at bedtime.     meclizine 12.5 MG tablet  Commonly known as:  ANTIVERT  Take 1 tablet (12.5 mg total) by mouth 3 (three) times daily as needed for dizziness.     mirtazapine 15 MG tablet  Commonly known as:  REMERON  Take 15 mg by mouth every evening.     ondansetron 4 MG disintegrating tablet  Commonly known as:  ZOFRAN-ODT  One tablet qd prn nausea     ondansetron 4 MG tablet  Commonly known as:  ZOFRAN  Take 4 mg by mouth every 8 (eight) hours as needed for nausea or vomiting.     promethazine 12.5 MG tablet  Commonly known as:  PHENERGAN  Take 12.5 mg by mouth every 12 (twelve) hours as needed for nausea or vomiting.     rizatriptan 5 MG  disintegrating tablet  Commonly known as:  MAXALT-MLT  Take 1 tablet (5 mg total) by mouth as needed for migraine. May repeat in 2 hours if needed     sertraline 25 MG tablet  Commonly known as:  ZOLOFT  Take 25 mg by mouth daily.     SUMAtriptan 50 MG tablet  Commonly known as:  IMITREX  Take 50 mg by mouth every 2 (two) hours as needed.     verapamil 180 MG 24 hr capsule  Commonly known as:  VERELAN PM  Take 1 capsule (180 mg total) by mouth at bedtime.        Allergies:  Allergies  Allergen Reactions  . Aspirin Shortness Of Breath and Swelling  . Ibuprofen Shortness Of Breath and Swelling    Family History: Family History   Problem Relation Age of Onset  . Hypertension Mother   . Atrial fibrillation Father   . Healthy Brother   . Arthritis/Rheumatoid Paternal Grandmother   . Healthy Brother     Social History:  reports that she has never smoked. She does not have any smokeless tobacco history on file. She reports that she does not drink alcohol or use illicit drugs.  ROS: UROLOGY Frequent Urination?: No Hard to postpone urination?: No Burning/pain with urination?: Yes Get up at night to urinate?: No Leakage of urine?: No Urine stream starts and stops?: Yes Trouble starting stream?: Yes Do you have to strain to urinate?: No Blood in urine?: Yes Urinary tract infection?: Yes Sexually transmitted disease?: No Injury to kidneys or bladder?: No Painful intercourse?: No Weak stream?: Yes Currently pregnant?: Yes Vaginal bleeding?: No Last menstrual period?: 06/10/2015  Gastrointestinal Nausea?: Yes Vomiting?: No Indigestion/heartburn?: No Diarrhea?: Yes Constipation?: Yes  Constitutional Fever: No Night sweats?: No Weight loss?: Yes Fatigue?: Yes  Skin Skin rash/lesions?: No Itching?: No  Eyes Blurred vision?: Yes Double vision?: No  Ears/Nose/Throat Sore throat?: No Sinus problems?: No  Hematologic/Lymphatic Swollen glands?: No Easy bruising?: No  Cardiovascular Leg swelling?: No Chest pain?: No  Respiratory Cough?: No Shortness of breath?: No  Endocrine Excessive thirst?: No  Musculoskeletal Back pain?: Yes Joint pain?: No  Neurological Headaches?: Yes Dizziness?: Yes  Psychologic Depression?: No Anxiety?: No  Physical Exam: BP 113/81 mmHg  Pulse 78  Resp 16  Ht  (1.702 m)  Wt 129 lb 9.6 oz (58.786 kg)  BMI 20.29 kg/m2  Constitutional:  Alert and oriented, No acute distress. HEENT: [place] AT, moist mucus membranes.  Trachea midline, no masses. Cardiovascular: No clubbing, cyanosis, or edema. Respiratory: Normal respiratory effort, no increased  work of breathing. GI: Abdomen is soft, nontender, nondistended, no abdominal masses GU: No CVA tenderness.  Pelvic Exam: Normal external vaginal exam, urethra normal, no obvious cystocele Skin: No rashes, bruises or suspicious lesions. Lymph: No cervical or inguinal adenopathy. Neurologic: Grossly intact, no focal deficits, moving all 4 extremities. Psychiatric: Normal mood and affect.  Laboratory Data:  Lab Results  Component Value Date   WBC 7.2 06/13/2015   HGB 14.6 06/13/2015   HCT 41.8 06/13/2015   MCV 91.1 06/13/2015   PLT 286 06/13/2015    Lab Results  Component Value Date   CREATININE 0.82 06/13/2015    No results found for: PSA  No results found for: TESTOSTERONE  No results found for: HGBA1C  Urinalysis    Component Value Date/Time   COLORURINE ORANGE* 06/13/2015 2341   APPEARANCEUR CLOUDY* 06/13/2015 2341   LABSPEC 1.013 06/13/2015 2341   PHURINE 6.0 06/13/2015  2341   GLUCOSEU NEGATIVE 06/13/2015 2341   HGBUR SMALL* 06/13/2015 2341   BILIRUBINUR NEGATIVE 06/13/2015 2341   KETONESUR 15* 06/13/2015 2341   PROTEINUR NEGATIVE 06/13/2015 2341   NITRITE POSITIVE* 06/13/2015 2341   LEUKOCYTESUR SMALL* 06/13/2015 2341    Pertinent Imaging: EXAM: CT abdomen and pelvis with contrast DATE: 06/02/15 23:21:07 ACCESSION: [id number] UN DICTATED: 06/03/15 00:33:00 INTERPRETATION LOCATION: [facility]  CLINICAL INDICATION: 35 Year Old (F):  - . r/o adrenal abnormalities, neuroendocrine tumors, weakness    COMPARISON: Abdominal ultrasound 04/29/15  TECHNIQUE: A spiral CT scan was obtained with IV contrast from the lung bases to the pubic symphysis.  Images were reconstructed in the axial plane. Coronal and sagittal reformatted images were also provided for further evaluation.  FINDINGS:  Lung bases are unremarkable.  Liver, gallbladder, spleen, pancreas, and adrenal glands are unremarkable.  Bilateral 3 mm nonobstructing renal calculi. Subcentimeter left  renal hypodensity, too small to characterize. No hydronephrosis.  Bladder is mildly distended. Uterus and bilateral ovaries are unremarkable.  Bowel is unremarkable. Normal appendix.  Unremarkable vasculature.  No lymphadenopathy.  No acute osseous abnormalities.  IMPRESSION: -- No acute findings in the abdomen or pelvis. -- Nonobstructing 3 mm bilateral renal calculi.   Assessment & Plan:    1. Urinary retention-  Foley catheter was removed today without difficulty. Patient returned for PVR after lunch and she had 250 mL's in her bladder. She reports that she was able to void small amounts on her own. She was instructed on CIC and demonstrated self catheterization.  She was advised to perform self-catheterization 2-3 times per day and titrate according to PVR volumes. She was provided a bladder diary to record catheterized output. She will be referred for UDS and follow up with physician to review results.  I suspect her urinary symptoms are related to an underlying neurologic condition. Patient has a follow-up with neurology this afternoon.  2. Nephrolithiasis-  -- Nonobstructing 3 mm bilateral renal calculi visualized on CT 06/02/15  There are no diagnoses linked to this encounter.  Return in about 2 weeks (around 07/02/2015) for urinary retention/PVR/review UDS results.  These notes generated with voice recognition software. I apologize for typographical errors.  Earlie LouLindsay Chanette Demo, FNP  [facility] [address], [address] [place], [place] [address] [phone]

## 2015-06-22 ENCOUNTER — Telehealth: Payer: Self-pay

## 2015-06-22 DIAGNOSIS — R109 Unspecified abdominal pain: Secondary | ICD-10-CM

## 2015-06-22 NOTE — Telephone Encounter (Signed)
Pt called stating she is continuing to have pain around her left kidney. Did not see a ucx. Please advise.

## 2015-06-23 DIAGNOSIS — R339 Retention of urine, unspecified: Secondary | ICD-10-CM | POA: Insufficient documentation

## 2015-06-23 NOTE — Telephone Encounter (Signed)
Spoke with pt in reference to ucx and RUS. Pt stted she will stop by today or tomorrow for a ucx. Made aware scheduling will be contacting her to make RUS appt. Pt voiced understanding.

## 2015-06-23 NOTE — Telephone Encounter (Signed)
There was not a culture sent because she was here for a catheter removal.  She can come in and provide a urine specimen to be sent if she feels like she has an infection. I do not recall her c/o significant kidney pain at her visit but I  would like to her to have RUS done before her f/u appt with an MD to review her UDS results. I placed the order for the RUS.

## 2015-06-24 ENCOUNTER — Ambulatory Visit (INDEPENDENT_AMBULATORY_CARE_PROVIDER_SITE_OTHER): Payer: 59

## 2015-06-24 DIAGNOSIS — N390 Urinary tract infection, site not specified: Secondary | ICD-10-CM

## 2015-06-24 LAB — URINALYSIS, COMPLETE
Bilirubin, UA: NEGATIVE
Glucose, UA: NEGATIVE
Ketones, UA: NEGATIVE
Leukocytes, UA: NEGATIVE
Nitrite, UA: NEGATIVE
Protein, UA: NEGATIVE
Specific Gravity, UA: 1.025 (ref 1.005–1.030)
Urobilinogen, Ur: 0.2 mg/dL (ref 0.2–1.0)
pH, UA: 6.5 (ref 5.0–7.5)

## 2015-06-24 LAB — MICROSCOPIC EXAMINATION: Bacteria, UA: NONE SEEN

## 2015-06-24 NOTE — Progress Notes (Signed)
Continuous Intermittent Catheterization  Due to urinary retention patient is present today for a teaching of self I & O Catheterization. Patient was given detailed verbal and printed instructions of self catheterization. Patient was cleaned and prepped in a sterile fashion.  With instruction and assistance patient inserted a 14FR and urine return was noted 450 ml, urine was amber in color. Patient tolerated well, no complications were noted   Preformed by: Rupert Stackshelsea Landon Bassford, LPN   Additional Notes: u/a and ucx were performed due to possible infection. Pt was previously taught how to CIC, but when pt came into the office she stated she is not able to urinate and requested to be cath'd. Once in exam room pt voiced concerns of having to self cath and stated shes not sure she was performing the task correctly. Therefore nurse reinforced with pt how to self cath. Pt voiced understanding at the end of the procedure.

## 2015-06-26 LAB — CULTURE, URINE COMPREHENSIVE

## 2015-06-28 ENCOUNTER — Telehealth: Payer: Self-pay

## 2015-06-28 NOTE — Telephone Encounter (Signed)
-----   Message from Fernanda DrumLindsay C Overton, FNP sent at 06/28/2015  8:23 AM EDT ----- Please notify patient that her urine culture was negative for infection. Her UA did show some red blood cells. I believe that this is most likely from her performing self catheterizations. We will recheck it at her next visit. Thanks

## 2015-06-28 NOTE — Telephone Encounter (Signed)
Spoke with pt in reference to -ucx. Pt voiced understanding.  

## 2015-06-29 ENCOUNTER — Ambulatory Visit
Admission: RE | Admit: 2015-06-29 | Discharge: 2015-06-29 | Disposition: A | Discharge status: Home / Self Care | Payer: 59 | Source: Ambulatory Visit | Attending: Obstetrics and Gynecology | Admitting: Obstetrics and Gynecology

## 2015-06-29 DIAGNOSIS — R10A0 Flank pain, unspecified side: Secondary | ICD-10-CM

## 2015-06-29 DIAGNOSIS — R109 Unspecified abdominal pain: Secondary | ICD-10-CM

## 2015-06-29 DIAGNOSIS — N200 Calculus of kidney: Secondary | ICD-10-CM | POA: Insufficient documentation

## 2015-07-02 ENCOUNTER — Ambulatory Visit: Payer: 59

## 2015-07-14 ENCOUNTER — Other Ambulatory Visit: Payer: Self-pay | Admitting: Urology

## 2015-07-15 ENCOUNTER — Ambulatory Visit: Payer: 59 | Admitting: Neurology

## 2015-07-21 ENCOUNTER — Ambulatory Visit (INDEPENDENT_AMBULATORY_CARE_PROVIDER_SITE_OTHER): Payer: 59 | Admitting: Urology

## 2015-07-21 ENCOUNTER — Encounter: Payer: Self-pay | Admitting: Urology

## 2015-07-21 VITALS — BP 120/85 | HR 78 | Ht 67.0 in | Wt 133.1 lb

## 2015-07-21 DIAGNOSIS — R339 Retention of urine, unspecified: Secondary | ICD-10-CM | POA: Diagnosis not present

## 2015-07-21 LAB — URINALYSIS, COMPLETE
Bilirubin, UA: NEGATIVE
Glucose, UA: NEGATIVE
Leukocytes, UA: NEGATIVE
Nitrite, UA: NEGATIVE
Protein, UA: NEGATIVE
Specific Gravity, UA: >1.03 — ABNORMAL HIGH (ref 1.005–1.030)
Urobilinogen, Ur: 0.2 mg/dL (ref 0.2–1.0)
pH, UA: 5.5 (ref 5.0–7.5)

## 2015-07-21 LAB — MICROSCOPIC EXAMINATION
Bacteria, UA: NONE SEEN
Epithelial Cells (non renal): >10 /hpf — AB (ref 0–10)

## 2015-07-21 LAB — BLADDER SCAN AMB NON-IMAGING: Scan Result: 14

## 2015-07-21 MED ORDER — TAMSULOSIN HCL 0.4 MG PO CAPS: 0.4000 mg | ORAL_CAPSULE | Freq: Every day | ORAL | Status: DC

## 2015-07-21 NOTE — Progress Notes (Signed)
Bladder Scan Patient void: 14 ml Performed By: Theotis BurrowK.russell,CMA

## 2015-07-21 NOTE — Progress Notes (Signed)
07/21/2015 10:14 AM   Christine Chavez Jun 02, 1980 [id number]  Referring provider: Maisie Fus, MD [address] [place], [place] [address]  Chief Complaint  Patient presents with  . Urinary Retention    UDS results    HPI: Christine Chavez Patient is a 35 year old female with a recent history of new onset neurologic symptoms including severe headaches, upper and lower extremity weakness and most recently the urinary retention. She reports that she was hospitalized at [facility] for upon approximately 11 days. Her neurologic workup has been negative thus far. Testing for multiple sclerosis has been negative. She was experiencing difficulty voiding prior to discharge which was attributed to her change in multiple medications. She continued to experience difficulty urinating and had a friend who is a nurse come over and perform a urinary catheterization which resulted in approximately a liter urine output. She then was seen in the emergency department and a Foley catheter was placed approximately 1 week ago. She reports that she has not taken any medications over the last week. She presents today for voiding trial. She continues to experience weakness and severe headaches.    During the patient's last visit it had been noted she had had a positive culture and a small nonobstructing stones bilaterally with no hydronephrosis. She had a trial of voiding with a postvoid residual of 250 mL. A self-catheterization protocol was set up.  The patient is here to discuss urodynamics. She was catheterized for 50 mL. Her maximum bladder capacity was 900 mL. Her bladder was hypo-sensitive. Her bladder was stable. She did not leak with Valsalva pressure 58 cm's water. She had a lot of difficulty voiding in the urodynamics lab. She could only generate a voluntary contraction a 6 cm of water that was not well sustained. Bladder neck descended 1 cm. Her first sensation of bladder fullness was 543 mL. The details of  the urodynamics are sided dictated on the urodynamics sheet.  The patient had leg symptoms with heaviness last November but her acute event was in mid February and she was admitted due to complications from a lumbar puncture. Her somatic symptoms are improving but she still having headaches. On a good day she said her flow is reasonable with minimal hesitancy though this is not quite back to normal. Other day she'll void once and then have trouble to urinate and will catheterize her 500 mL. She thinks otherwise her residuals are 100 mL or less and has not catheterized for a week  Her postvoid residual today was 14 mL. She has no loss of vaginal or perirectal sensation.  Apparently she had some nonspecific changes on the MRI of her brain  Modifying factors: There are no other modifying factors  Associated signs and symptoms: There are no other associated signs and symptoms Aggravating and relieving factors: There are no other aggravating or relieving factors Severity: Moderate Duration: Persistent   PMH: Past Medical History  Diagnosis Date  . Headache   . Vision abnormalities     Surgical History: Past Surgical History  Procedure Laterality Date  . Tonsillectomy    . Anterior cruciate ligament repair  1997  . Exploratory laparotomy  1999    Home Medications:    Medication List       This list is accurate as of: 07/21/15 10:14 AM.  Always use your most recent med list.               butalbital-acetaminophen-caffeine 50-325-40 MG tablet  Commonly known as:  FIORICET, ESGIC  TAKE 2 TABLETS BY MOUTH EVERY 4 HOURS AS NEEDED     ciprofloxacin 500 MG tablet  Commonly known as:  CIPRO  Take 1 tablet (500 mg total) by mouth 2 (two) times daily.     famotidine 20 MG tablet  Commonly known as:  PEPCID  Take 20 mg by mouth 2 (two) times daily.     gabapentin 300 MG capsule  Commonly known as:  NEURONTIN  Take 900 mg by mouth at bedtime.     meclizine 12.5 MG tablet    Commonly known as:  ANTIVERT  Take 1 tablet (12.5 mg total) by mouth 3 (three) times daily as needed for dizziness.     mirtazapine 15 MG tablet  Commonly known as:  REMERON  Take 15 mg by mouth every evening.     ondansetron 4 MG disintegrating tablet  Commonly known as:  ZOFRAN-ODT  One tablet qd prn nausea     ondansetron 4 MG tablet  Commonly known as:  ZOFRAN  Take 4 mg by mouth every 8 (eight) hours as needed for nausea or vomiting. Reported on 07/21/2015     promethazine 12.5 MG tablet  Commonly known as:  PHENERGAN  Take 12.5 mg by mouth every 12 (twelve) hours as needed for nausea or vomiting.     rizatriptan 5 MG disintegrating tablet  Commonly known as:  MAXALT-MLT  Take 1 tablet (5 mg total) by mouth as needed for migraine. May repeat in 2 hours if needed     sertraline 25 MG tablet  Commonly known as:  ZOLOFT  Take 25 mg by mouth daily.     SUMAtriptan 50 MG tablet  Commonly known as:  IMITREX  Take 50 mg by mouth every 2 (two) hours as needed. Reported on 07/21/2015     verapamil 180 MG 24 hr capsule  Commonly known as:  VERELAN PM  Take 1 capsule (180 mg total) by mouth at bedtime.        Allergies:  Allergies  Allergen Reactions  . Aspirin Shortness Of Breath and Swelling  . Ibuprofen Shortness Of Breath and Swelling    Family History: Family History  Problem Relation Age of Onset  . Hypertension Mother   . Atrial fibrillation Father   . Healthy Brother   . Arthritis/Rheumatoid Paternal Grandmother   . Healthy Brother     Social History:  reports that she has never smoked. She does not have any smokeless tobacco history on file. She reports that she does not drink alcohol or use illicit drugs.  ROS: UROLOGY Frequent Urination?: No Hard to postpone urination?: No Burning/pain with urination?: No Get up at night to urinate?: No Leakage of urine?: No Urine stream starts and stops?: Yes Trouble starting stream?: Yes Do you have to strain to  urinate?: No Blood in urine?: No Urinary tract infection?: No Sexually transmitted disease?: No Injury to kidneys or bladder?: No Painful intercourse?: No Weak stream?: No Currently pregnant?: No Vaginal bleeding?: No Last menstrual period?: 06/10/15  Gastrointestinal Nausea?: Yes Vomiting?: No Indigestion/heartburn?: No Diarrhea?: No Constipation?: No  Constitutional Fever: No Night sweats?: No Weight loss?: Yes Fatigue?: Yes  Skin Skin rash/lesions?: No Itching?: No  Eyes Blurred vision?: No Double vision?: No  Ears/Nose/Throat Sore throat?: No Sinus problems?: No  Hematologic/Lymphatic Swollen glands?: No Easy bruising?: No  Cardiovascular Leg swelling?: No Chest pain?: No  Respiratory Cough?: No Shortness of breath?: No  Endocrine Excessive thirst?: No  Musculoskeletal Back pain?:  No Joint pain?: No  Neurological Headaches?: Yes Dizziness?: No  Psychologic Depression?: No Anxiety?: No  Physical Exam: BP 120/85 mmHg  Pulse 78  Ht 5\' 7"  (1.702 m)  Wt 60.374 kg (133 lb 1.6 oz)  BMI 20.84 kg/m2  Constitutional:  Alert and oriented, No acute distress. HEENT: [place] AT, moist mucus membranes.  Trachea midline, no masses. Cardiovascular: No clubbing, cyanosis, or edema. Respiratory: Normal respiratory effort, no increased work of breathing. GI: Abdomen is soft, nontender, nondistended, no abdominal masses GU: No CVA tenderness. No prolapse and normal sensation Skin: No rashes, bruises or suspicious lesions. Lymph: No cervical or inguinal adenopathy. Neurologic: Grossly intact, no focal deficits, moving all 4 extremities. Psychiatric: Normal mood and affect.  Laboratory Data: Lab Results  Component Value Date   WBC 7.2 06/13/2015   HGB 14.6 06/13/2015   HCT 41.8 06/13/2015   MCV 91.1 06/13/2015   PLT 286 06/13/2015    Lab Results  Component Value Date   CREATININE 0.82 06/13/2015    Urinalysis    Component Value Date/Time    COLORURINE ORANGE* 06/13/2015 2341   APPEARANCEUR Clear 06/24/2015 1619   APPEARANCEUR CLOUDY* 06/13/2015 2341   LABSPEC 1.013 06/13/2015 2341   PHURINE 6.0 06/13/2015 2341   GLUCOSEU Negative 06/24/2015 1619   HGBUR SMALL* 06/13/2015 2341   BILIRUBINUR Negative 06/24/2015 1619   BILIRUBINUR NEGATIVE 06/13/2015 2341   KETONESUR 15* 06/13/2015 2341   PROTEINUR Negative 06/24/2015 1619   PROTEINUR NEGATIVE 06/13/2015 2341   NITRITE Negative 06/24/2015 1619   NITRITE POSITIVE* 06/13/2015 2341   LEUKOCYTESUR Negative 06/24/2015 1619   LEUKOCYTESUR SMALL* 06/13/2015 2341    Pertinent Imaging: No new  Assessment & Plan:  The patient's flow symptoms and retention are due to a large capacity hyposensitive bladder. This may or may not be an acute on chronic problem versus an acute 1. Neurology is following her expectantly. She will continue to self catheterize and check residuals when needed. She'll continue to manage her bladder as she is now. The findings on urodynamics and her clinical presentation is out of the ordinary and likely she has a neurogenic bladder. The role of an alpha-blocker discussed.  3 weeks Rapaflo samples given. Flomax prescription given if Rapaflo helps. Watchful waiting regarding kidney stones. Reassess in 2 months  1. Urinary retention 2. Neurogenic bladder - BLADDER SCAN AMB NON-IMAGING - Urinalysis, Complete   No Follow-up on file.  Martina SinnerMACDIARMID,Dalaina Tates A, MD  [facility] [address], [address] [place], [place] [address] [phone]

## 2015-07-27 ENCOUNTER — Ambulatory Visit: Payer: 59 | Admitting: Neurology

## 2015-07-28 ENCOUNTER — Encounter: Payer: Self-pay | Admitting: Neurology

## 2015-07-28 ENCOUNTER — Ambulatory Visit (INDEPENDENT_AMBULATORY_CARE_PROVIDER_SITE_OTHER): Payer: 59 | Admitting: Neurology

## 2015-07-28 VITALS — BP 130/74 | HR 68 | Resp 14 | Ht 67.0 in | Wt 134.0 lb

## 2015-07-28 DIAGNOSIS — G43909 Migraine, unspecified, not intractable, without status migrainosus: Secondary | ICD-10-CM

## 2015-07-28 DIAGNOSIS — R9089 Other abnormal findings on diagnostic imaging of central nervous system: Secondary | ICD-10-CM

## 2015-07-28 DIAGNOSIS — R930 Abnormal findings on diagnostic imaging of skull and head, not elsewhere classified: Secondary | ICD-10-CM | POA: Diagnosis not present

## 2015-07-28 DIAGNOSIS — R200 Anesthesia of skin: Secondary | ICD-10-CM

## 2015-07-28 DIAGNOSIS — R42 Dizziness and giddiness: Secondary | ICD-10-CM | POA: Diagnosis not present

## 2015-07-28 DIAGNOSIS — G43109 Migraine with aura, not intractable, without status migrainosus: Secondary | ICD-10-CM

## 2015-07-28 MED ORDER — LEVETIRACETAM 750 MG PO TABS: 750.0000 mg | ORAL_TABLET | Freq: Two times a day (BID) | ORAL | Status: DC

## 2015-07-28 NOTE — Progress Notes (Signed)
[facility]  PATIENT: Christine Chavez DOB: 28-Jul-1980  REFERRING DOCTOR OR PCP:     Dr. Wanda Plump [facility] Fax [phone]) SOURCE:   patient, MRI images on CD and records from [facility]  _________________________________   HISTORICAL  CHIEF COMPLAINT:  Chief Complaint  Patient presents with  . Headaches    Sts. she saw a neurology PA, Ms. Elvera Bicker, in [place], and was told there was one suspicious spot on MRI brain.  LP was done in the ER after she presented with extremity numbness.  She was later admitted for h/a following LP.  Sts. was hospitalized for 11 days.  She was eval by Urology for difficulty voiding.  Sts. is now having to self-cath  intermittently.  She is now taking Remeron 7.5 mg for sleep and this helps.  Sts. she was told to stop Foltx b/c vit. B12 level was elevated.  Sts. she was   . Dizziness    taken off of Verapamil while in the hospital and has felt less "foggy" since stopping it.  Sts. Maxalt helps when she has a h/a/fim    HISTORY OF PRESENT ILLNESS:  Christine Chavez is a 35 year old woman with a history of classic migraine who had a complicated migraine 02/2015.     She did not tolerate Verapamil and it was stopped.       On May 30, 2015, she went to the [facility] ER with leg weakness, arm numbness and slurred speech.    In the ER, she had a lumbar puncture.   She had a post-LP headache and after she got no benefit with bedrest, a blood patch was performed.   During that hospital stay she had urinary hesitancy.   She was catherized.    She has seen Dr. Sherron Monday who did urodynamic studies.    She had a PVR only 14 mL when checked last week.    Flomax has been prescribed but she has not yet taken.      She has had a 25 pound weight loss over the last 6 months. While at [facility] Remeron was started to see if it would stimulate her appetite. She believes she has gained about 4 or 5 pounds.  The past month,  she is having  more frequent headaches, a couple a week.     The recent headaches are at the vertex, worse on the right.     They feel different than her more typical classic migrine headaches.   She does note nausea as the pain intensifies.  Pain and nausea are not always helped by Fioricet (which help typical migraine).   Maxalt helps if she takes right away but not if she waits.   HA is now daily.    She has small kidney stones so we cannot use topiramate or zonisamide as a preventative.    Due to urinary retention, I'll avoid any tricyclics or muscle relaxants  She also is getting episodes of lightheadedness and spinning and feels vision focusing is more difficulty when present.   She does not feel that eyes are jerking.   She is noting more heart palpitations, sometimes with activity and sometimes at random.        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    Over the next 45 minutes symptoms improved and she was able to walk.  The right side resolved completely and the left was better.  As strength improved, she began to experience nausea and throbbing right sided headache, though the pain was less intense than typical.    She went to the ED at [facility] and had a CT, EKG and labwork and everything was normal.   Percocet set was prescribed to take as needed for migraine.   She followed up with Dr. Andee Poles at [facility] a couple days later and Dr. Marchia Bond a couple weeks later.      An MRI was performed and showed many nonspecific foci.   Since the episode in November, she has also had GI upset with diarrhea and abdominal cramps. Hyoscyamine has helped a little bit.   ,.  Additionally, since the episode in November, she has felt very fatigued.   Classic Migraine:   She has a long history of migraine headaches starting 8-9 years ago and occurring 1 - 3 times monthly, usually not associated with her  cycle.   The headaches are classic and begin with an aura of altered vision and right scintillations . Typically, the aura last about 10 minutes and is then followed by a throbbing headache on the right side. If left alone the headache will usually last for 4 or more hours up to 2 days. It is associated with nausea and sometimes vomiting. Moving will make the headache worse. There is some photophobia and mild or phonophobia. Tylenol has not always helped. She has had allergies to ibuprofen and aspirin and does not take NSAIDs.    Imitrex 50 mg helped but she felt a chest pounding shortly after taking the medicine that was very uncomfortable.  She has tried Maxalt once and that was better tolerated. Zofran has helped the nausea at times.     I have personally reviewed the MRI images. They show multiple subcortical foci. Most of the foci are very small and round none of them appear to be acute.    REVIEW OF SYSTEMS: Constitutional: No fevers, chills, sweats, or change in appetite.  She reports fatigue and mild weight loss. Eyes: No visual changes, double vision, eye pain Ear, nose and throat: No hearing loss, ear pain, nasal congestion, sore throat Cardiovascular: No chest pain, palpitations Respiratory: No shortness of breath at rest or with exertion.   No wheezes GastrointestinaI: No nausea, vomiting (except with migraine).   She notes diarrhea, abdominal pain.   No  fecal incontinence Genitourinary: No dysuria, urinary retention or frequency.  No nocturia. Musculoskeletal: No neck pain, back pain Integumentary: No rash, pruritus, skin lesions Neurological: as above Psychiatric: No depression at this time.  No anxiety Endocrine: No palpitations, diaphoresis, change in appetite, change in weigh or increased thirst Hematologic/Lymphatic: No anemia, purpura, petechiae. Allergic/Immunologic: No itchy/runny eyes, nasal congestion, recent allergic reactions, rashes  ALLERGIES: Allergies  Allergen  Reactions  . Aspirin Shortness Of Breath and Swelling  . Ibuprofen Shortness Of Breath and Swelling    HOME MEDICATIONS:  Current outpatient prescriptions:  .  butalbital-acetaminophen-caffeine (FIORICET, ESGIC) 50-325-40 MG tablet, TAKE 2 TABLETS BY MOUTH EVERY 4 HOURS AS NEEDED, Disp: , Rfl:  .  famotidine (PEPCID) 20 MG tablet, TAKE 1 TABLET (20 MG TOTAL) BY MOUTH TWO (2) TIMES A DAY., Disp: , Rfl: 0 .  mirtazapine (REMERON) 15 MG tablet, TAKE 1 TABLET (15 MG TOTAL) BY MOUTH NIGHTLY., Disp: , Rfl: 3 .  ondansetron (ZOFRAN) 4 MG tablet, Take 4 mg by mouth every 8 (eight) hours as needed  for nausea or vomiting. Reported on 07/21/2015, Disp: , Rfl:  .  ondansetron (ZOFRAN-ODT) 4 MG disintegrating tablet, One tablet qd prn nausea, Disp: 20 tablet, Rfl: 3 .  rizatriptan (MAXALT-MLT) 5 MG disintegrating tablet, Take 1 tablet (5 mg total) by mouth as needed for migraine. May repeat in 2 hours if needed, Disp: 10 tablet, Rfl: 5 .  sertraline (ZOLOFT) 25 MG tablet, Take 25 mg by mouth daily., Disp: , Rfl:  .  SUMAtriptan (IMITREX) 50 MG tablet, Take 50 mg by mouth every 2 (two) hours as needed. Reported on 07/28/2015, Disp: , Rfl: 0 .  promethazine (PHENERGAN) 12.5 MG tablet, Take 12.5 mg by mouth every 12 (twelve) hours as needed for nausea or vomiting. , Disp: , Rfl:  .  tamsulosin (FLOMAX) 0.4 MG CAPS capsule, Take 1 capsule (0.4 mg total) by mouth daily. (Patient not taking: Reported on 07/28/2015), Disp: 30 capsule, Rfl: 11 .  verapamil (VERELAN PM) 180 MG 24 hr capsule, Take 1 capsule (180 mg total) by mouth at bedtime. (Patient not taking: Reported on 06/18/2015), Disp: 30 capsule, Rfl: 11  PAST MEDICAL HISTORY: Past Medical History  Diagnosis Date  . Headache   . Vision abnormalities     PAST SURGICAL HISTORY: Past Surgical History  Procedure Laterality Date  . Tonsillectomy    . Anterior cruciate ligament repair  1997  . Exploratory laparotomy  1999    FAMILY HISTORY: Family History    Problem Relation Age of Onset  . Hypertension Mother   . Atrial fibrillation Father   . Healthy Brother   . Arthritis/Rheumatoid Paternal Grandmother   . Healthy Brother     SOCIAL HISTORY:  Social History   Social History  . Marital Status: Married    Spouse Name: N/A  . Number of Children: N/A  . Years of Education: N/A   Occupational History  . Not on file.   Social History Main Topics  . Smoking status: Never Smoker   . Smokeless tobacco: Not on file  . Alcohol Use: No  . Drug Use: No  . Sexual Activity: Not on file   Other Topics Concern  . Not on file   Social History Narrative     PHYSICAL EXAM  Filed Vitals:   07/28/15 1633  BP: 130/74  Pulse: 68  Resp: 14  Height: 5\' 7"  (1.702 m)  Weight: 134 lb (60.782 kg)    Body mass index is 20.98 kg/(m^2).   General: The patient is well-developed and well-nourished and in no acute distress  Eyes:  Funduscopic exam shows normal optic discs and retinal vessels.  Neck:   The neck is minimally tender at the occiput bilaterally  Neurologic Exam  Mental status: The patient is alert and oriented x 3 at the time of the examination. The patient has apparent normal recent and remote memory, with an apparently normal attention span and concentration ability.   Speech is normal.  Cranial nerves: Extraocular movements are full. Pupils are equal, round, and reactive to light and accomodation.  Visual fields are full.  Facial symmetry is present. There is good facial sensation to soft touch bilaterally.Facial strength is normal.  Trapezius and sternocleidomastoid strength is normal. No dysarthria is noted. No obvious hearing deficits are noted.  Motor:  Muscle bulk is normal.   Tone is normal. Strength is  5 / 5 in all 4 extremities.   Sensory: Sensory testing is intact to pinprick, soft touch and vibration sensation in all 4 extremities.  Coordination: Cerebellar testing reveals good finger-nose-finger and  heel-to-shin bilaterally.  Gait and station: Station is normal.   Gait is normal. Tandem gait is normal. Romberg is negative.   Reflexes: Deep tendon reflexes are symmetric and normal bilaterally.        DIAGNOSTIC DATA (LABS, IMAGING, TESTING) - I reviewed patient records, labs, notes, testing and imaging myself where available.      ASSESSMENT AND PLAN  Complicated migraine  Abnormal finding on MRI of brain  Dizziness  Numbness     1.   I believe the MRI changes are most likely due to complicated migraine. Pattern is not too consistent with MS. Additionally, lumbar puncture was reportedly normal.  2.   Continue Maxalt when necessary.  3.   Keppra 750 mg twice a day for migraine prophylaxis. Due to her urinary retention, she is not a candidate for a tricyclic agent. Due to her kidney stones, she should not take topiramate or zonmisamide 4.   She will return to see me in 4 months or sooner if there are new or worsening neurologic symptoms.   Richard A. Epimenio FootSater, MD, PhD 07/28/2015, 4:39 PM Certified in Neurology, Clinical Neurophysiology, Sleep Medicine, Pain Medicine and Neuroimaging  [facility] [address], [address] [place], [place] [address] [phone]

## 2015-09-01 ENCOUNTER — Encounter: Payer: Self-pay | Admitting: *Deleted

## 2015-09-01 ENCOUNTER — Ambulatory Visit
Admit: 2015-09-01 | Discharge: 2015-09-01 | Disposition: A | Discharge status: Home / Self Care | Payer: 59 | Attending: Family Medicine | Admitting: Family Medicine

## 2015-09-01 ENCOUNTER — Ambulatory Visit
Admission: EM | Admit: 2015-09-01 | Discharge: 2015-09-01 | Payer: 59 | Attending: Family Medicine | Admitting: Family Medicine

## 2015-09-01 DIAGNOSIS — N83202 Unspecified ovarian cyst, left side: Secondary | ICD-10-CM | POA: Diagnosis not present

## 2015-09-01 DIAGNOSIS — R109 Unspecified abdominal pain: Secondary | ICD-10-CM

## 2015-09-01 DIAGNOSIS — N200 Calculus of kidney: Secondary | ICD-10-CM | POA: Insufficient documentation

## 2015-09-01 DIAGNOSIS — R1031 Right lower quadrant pain: Secondary | ICD-10-CM | POA: Insufficient documentation

## 2015-09-01 DIAGNOSIS — N3289 Other specified disorders of bladder: Secondary | ICD-10-CM | POA: Diagnosis not present

## 2015-09-01 HISTORY — DX: Disorder of kidney and ureter, unspecified: N28.9

## 2015-09-01 LAB — CBC WITH DIFFERENTIAL/PLATELET
Basophils Absolute: 0.1 10*3/uL (ref 0–0.1)
Basophils Relative: 1 %
Eosinophils Absolute: 0.1 10*3/uL (ref 0–0.7)
Eosinophils Relative: 1 %
HCT: 40.3 % (ref 35.0–47.0)
Hemoglobin: 13.6 g/dL (ref 12.0–16.0)
Lymphocytes Relative: 31 %
Lymphs Abs: 2.6 10*3/uL (ref 1.0–3.6)
MCH: 31.5 pg (ref 26.0–34.0)
MCHC: 33.8 g/dL (ref 32.0–36.0)
MCV: 93.3 fL (ref 80.0–100.0)
Monocytes Absolute: 0.5 10*3/uL (ref 0.2–0.9)
Monocytes Relative: 6 %
Neutro Abs: 5.2 10*3/uL (ref 1.4–6.5)
Neutrophils Relative %: 61 %
Platelets: 240 10*3/uL (ref 150–440)
RBC: 4.32 MIL/uL (ref 3.80–5.20)
RDW: 13 % (ref 11.5–14.5)
WBC: 8.4 10*3/uL (ref 3.6–11.0)

## 2015-09-01 LAB — URINALYSIS COMPLETE WITH MICROSCOPIC (ARMC ONLY)
Bilirubin Urine: NEGATIVE
Glucose, UA: NEGATIVE mg/dL
Ketones, ur: NEGATIVE mg/dL
Leukocytes, UA: NEGATIVE
Nitrite: NEGATIVE
Protein, ur: NEGATIVE mg/dL
Specific Gravity, Urine: 1.02 (ref 1.005–1.030)
pH: 5.5 (ref 5.0–8.0)

## 2015-09-01 LAB — COMPREHENSIVE METABOLIC PANEL
ALT: 15 U/L (ref 14–54)
AST: 16 U/L (ref 15–41)
Albumin: 4.3 g/dL (ref 3.5–5.0)
Alkaline Phosphatase: 59 U/L (ref 38–126)
Anion gap: 6 (ref 5–15)
BUN: 9 mg/dL (ref 6–20)
CO2: 26 mmol/L (ref 22–32)
Calcium: 9.3 mg/dL (ref 8.9–10.3)
Chloride: 104 mmol/L (ref 101–111)
Creatinine, Ser: 0.64 mg/dL (ref 0.44–1.00)
GFR calc Af Amer: >60 mL/min (ref >60)
GFR calc non Af Amer: >60 mL/min (ref >60)
Glucose, Bld: 92 mg/dL (ref 65–99)
Potassium: 3.9 mmol/L (ref 3.5–5.1)
Sodium: 136 mmol/L (ref 135–145)
Total Bilirubin: 1.4 mg/dL — ABNORMAL HIGH (ref 0.3–1.2)
Total Protein: 7.6 g/dL (ref 6.5–8.1)

## 2015-09-01 MED ORDER — ONDANSETRON 8 MG PO TBDP
8.0000 mg | ORAL_TABLET | Freq: Once | ORAL | Status: AC
  Administered 2015-09-01: 8 mg via ORAL

## 2015-09-01 MED ORDER — IOPAMIDOL (ISOVUE-300) INJECTION 61%
100.0000 mL | Freq: Once | INTRAVENOUS | Status: AC | PRN
  Administered 2015-09-01: 100 mL via INTRAVENOUS

## 2015-09-01 NOTE — ED Notes (Signed)
Patient started having issues with urinary retention two days ago with pain and nausea. Patient has an extensive history of kidney stones and urinary retention.

## 2015-09-02 LAB — URINE CULTURE: Culture: 4000 — AB

## 2015-09-22 ENCOUNTER — Ambulatory Visit (INDEPENDENT_AMBULATORY_CARE_PROVIDER_SITE_OTHER): Payer: 59 | Admitting: Urology

## 2015-09-22 VITALS — BP 119/80 | HR 73 | Ht 67.0 in | Wt 136.0 lb

## 2015-09-22 DIAGNOSIS — N319 Neuromuscular dysfunction of bladder, unspecified: Secondary | ICD-10-CM | POA: Diagnosis not present

## 2015-09-22 DIAGNOSIS — R339 Retention of urine, unspecified: Secondary | ICD-10-CM | POA: Diagnosis not present

## 2015-09-22 LAB — URINALYSIS, COMPLETE
Bilirubin, UA: NEGATIVE
Glucose, UA: NEGATIVE
Ketones, UA: NEGATIVE
Leukocytes, UA: NEGATIVE
Nitrite, UA: NEGATIVE
Protein, UA: NEGATIVE
Specific Gravity, UA: 1.025 (ref 1.005–1.030)
Urobilinogen, Ur: 1 mg/dL (ref 0.2–1.0)
pH, UA: 6 (ref 5.0–7.5)

## 2015-09-22 LAB — MICROSCOPIC EXAMINATION: Epithelial Cells (non renal): >10 /hpf — AB (ref 0–10)

## 2015-09-22 NOTE — Progress Notes (Signed)
09/22/2015 11:20 AM   Christine Chavez March 30, 1981 [id number]  Referring provider: Maisie Fus, MD [address] [place], [place] [address]  Chief Complaint  Patient presents with  . Neurogenic Bladder    2months    HPI: Christine Chavez Patient is a 35 year old female with a recent history of new onset neurologic symptoms including severe headaches, upper and lower extremity weakness and most recently the urinary retention. She reports that she was hospitalized at [facility] for upon approximately 11 days. Her neurologic workup has been negative thus far. Testing for multiple sclerosis has been negative. She was experiencing difficulty voiding prior to discharge which was attributed to her change in multiple medications. She continued to experience difficulty urinating and had a friend who is a nurse come over and perform a urinary catheterization which resulted in approximately a liter urine output. She then was seen in the emergency department and a Foley catheter was placed approximately 1 week ago. She reports that she has not taken any medications over the last week. She presents today for voiding trial. She continues to experience weakness and severe headaches.   During the patient's last visit it had been noted she had had a positive culture and a small nonobstructing stones bilaterally with no hydronephrosis. She had a trial of voiding with a postvoid residual of 250 mL. A self-catheterization protocol was set up.  The patient is here to discuss urodynamics. She was catheterized for 50 mL. Her maximum bladder capacity was 900 mL. Her bladder was hypo-sensitive. Her bladder was stable. She did not leak with Valsalva pressure 58 cm's water. She had a lot of difficulty voiding in the urodynamics lab. She could only generate a voluntary contraction a 6 cm of water that was not well sustained. Bladder neck descended 1 cm. Her first sensation of bladder fullness was 543 mL. The details of  the urodynamics are sided dictated on the urodynamics sheet.  The patient's flow symptoms and retention are due to a large capacity hyposensitive bladder. This may or may not be an acute on chronic problem versus an acute 1. Neurology is following her expectantly. She will continue to self catheterize and check residuals when needed. She'll continue to manage her bladder as she is now. The findings on urodynamics and her clinical presentation is out of the no change in bladder function. ordinary and likely she has a neurogenic bladder. The role of an alpha-blocker discussed.  Today no change in bladder function. Catheterizes 3-4 times a week but I believe some of the volumes if they are post void residuals are probed 4 ounces. She will see a new neurologist about this in August and she had left arm and face symptoms recently.  Friday she had right flank pain and some blood in the urine. The symptoms have settled down  Recently she had a CT scan that showed small bilateral nonobstructing stones.  Modifying factors: There are no other modifying factors  Associated signs and symptoms: There are no other associated signs and symptoms Aggravating and relieving factors: There are no other aggravating or relieving factors Severity: Moderate Duration: Improved     PMH: Past Medical History  Diagnosis Date  . Headache   . Vision abnormalities   . Renal disorder     Surgical History: Past Surgical History  Procedure Laterality Date  . Tonsillectomy    . Anterior cruciate ligament repair  1997  . Exploratory laparotomy  1999    Home Medications:    Medication  List       This list is accurate as of: 09/22/15 11:20 AM.  Always use your most recent med list.               butalbital-acetaminophen-caffeine 50-325-40 MG tablet  Commonly known as:  FIORICET, ESGIC  TAKE 2 TABLETS BY MOUTH EVERY 4 HOURS AS NEEDED     famotidine 20 MG tablet  Commonly known as:  PEPCID  TAKE 1 TABLET  (20 MG TOTAL) BY MOUTH TWO (2) TIMES A DAY.     levETIRAcetam 750 MG tablet  Commonly known as:  KEPPRA  Take 1 tablet (750 mg total) by mouth 2 (two) times daily.     mirtazapine 15 MG tablet  Commonly known as:  REMERON  TAKE 1 TABLET (15 MG TOTAL) BY MOUTH NIGHTLY.     ondansetron 4 MG tablet  Commonly known as:  ZOFRAN  Take 4 mg by mouth every 8 (eight) hours as needed for nausea or vomiting. Reported on 07/21/2015     promethazine 12.5 MG tablet  Commonly known as:  PHENERGAN  Take 12.5 mg by mouth every 12 (twelve) hours as needed for nausea or vomiting.     rizatriptan 5 MG disintegrating tablet  Commonly known as:  MAXALT-MLT  Take 1 tablet (5 mg total) by mouth as needed for migraine. May repeat in 2 hours if needed     sertraline 25 MG tablet  Commonly known as:  ZOLOFT  Take 25 mg by mouth daily.     SUMAtriptan 50 MG tablet  Commonly known as:  IMITREX  Take 50 mg by mouth every 2 (two) hours as needed. Reported on 07/28/2015     tamsulosin 0.4 MG Caps capsule  Commonly known as:  FLOMAX  Take 1 capsule (0.4 mg total) by mouth daily.        Allergies:  Allergies  Allergen Reactions  . Aspirin Shortness Of Breath and Swelling  . Ibuprofen Shortness Of Breath and Swelling    Family History: Family History  Problem Relation Age of Onset  . Hypertension Mother   . Atrial fibrillation Father   . Healthy Brother   . Arthritis/Rheumatoid Paternal Grandmother   . Healthy Brother     Social History:  reports that she has never smoked. She does not have any smokeless tobacco history on file. She reports that she does not drink alcohol or use illicit drugs.  ROS: UROLOGY Frequent Urination?: No Hard to postpone urination?: No Burning/pain with urination?: No Get up at night to urinate?: No Leakage of urine?: No Urine stream starts and stops?: Yes Trouble starting stream?: Yes Do you have to strain to urinate?: No Blood in urine?: Yes Urinary tract  infection?: No Sexually transmitted disease?: No Injury to kidneys or bladder?: No Painful intercourse?: No Weak stream?: No Currently pregnant?: No Vaginal bleeding?: No Last menstrual period?: 09/18/15  Gastrointestinal Nausea?: No Vomiting?: No Indigestion/heartburn?: No Diarrhea?: No Constipation?: No  Constitutional Fever: No Night sweats?: No Weight loss?: No Fatigue?: Yes  Skin Skin rash/lesions?: No Itching?: No  Eyes Blurred vision?: Yes Double vision?: No  Ears/Nose/Throat Sore throat?: No Sinus problems?: No  Hematologic/Lymphatic Swollen glands?: No Easy bruising?: No  Cardiovascular Leg swelling?: No Chest pain?: No  Respiratory Cough?: No Shortness of breath?: No  Endocrine Excessive thirst?: No  Musculoskeletal Back pain?: Yes Joint pain?: No  Neurological Headaches?: Yes Dizziness?: No  Psychologic Depression?: No Anxiety?: No  Physical Exam: BP 119/80 mmHg  Pulse 73  Ht 5\' 7"  (1.702 m)  Wt 136 lb (61.689 kg)  BMI 21.30 kg/m2  LMP 09/18/2015  .  Laboratory Data: Lab Results  Component Value Date   WBC 8.4 09/01/2015   HGB 13.6 09/01/2015   HCT 40.3 09/01/2015   MCV 93.3 09/01/2015   PLT 240 09/01/2015    Lab Results  Component Value Date   CREATININE 0.64 09/01/2015    Urinalysis    Component Value Date/Time   COLORURINE STRAW* 09/01/2015 1344   APPEARANCEUR HAZY* 09/01/2015 1344   APPEARANCEUR Hazy* 07/21/2015 0945   LABSPEC 1.020 09/01/2015 1344   PHURINE 5.5 09/01/2015 1344   GLUCOSEU NEGATIVE 09/01/2015 1344   HGBUR 1+* 09/01/2015 1344   BILIRUBINUR NEGATIVE 09/01/2015 1344   BILIRUBINUR Negative 07/21/2015 0945   KETONESUR NEGATIVE 09/01/2015 1344   PROTEINUR NEGATIVE 09/01/2015 1344   PROTEINUR Negative 07/21/2015 0945   NITRITE NEGATIVE 09/01/2015 1344   NITRITE Negative 07/21/2015 0945   LEUKOCYTESUR NEGATIVE 09/01/2015 1344   LEUKOCYTESUR Negative 07/21/2015 0945    Pertinent  Imaging: none  Assessment & Plan:  The patient has a neurogenic bladder. It dates back to November 2016. I certainly would not recommend sacral nerve stimulation at this stage. I will reassess her in 6 months. I'll call the urine cultures positive  She may have passed a kidney stone  1. Neurogenic bladder 2. Blood in urine   No Follow-up on file.  Martina Sinner, MD  [facility] [address], [address] [place], [place] [address] [phone]

## 2015-09-24 LAB — CULTURE, URINE COMPREHENSIVE

## 2015-10-04 NOTE — ED Provider Notes (Signed)
CSN: [id number]     Arrival date & time 09/01/15  1614 History   First MD Initiated Contact with Patient 09/01/15 1338     Chief Complaint  Patient presents with  . Urinary Retention  . Back Pain   (Consider location/radiation/quality/duration/timing/severity/associated sxs/prior Treatment) HPI Comments: 35 yo female with a h/o kidney stones and urinary retention presents wtiha c/o 2 days of discomfort with urination, right sided pain and nausea. Denies any vomiting, fevers, chills, hematuria.   The history is provided by the patient.    Past Medical History  Diagnosis Date  . Headache   . Vision abnormalities   . Renal disorder    Past Surgical History  Procedure Laterality Date  . Tonsillectomy    . Anterior cruciate ligament repair  1997  . Exploratory laparotomy  1999   Family History  Problem Relation Age of Onset  . Hypertension Mother   . Atrial fibrillation Father   . Healthy Brother   . Arthritis/Rheumatoid Paternal Grandmother   . Healthy Brother    Social History  Substance Use Topics  . Smoking status: Never Smoker   . Smokeless tobacco: None  . Alcohol Use: No   OB History    No data available     Review of Systems  Allergies  Aspirin and Ibuprofen  Home Medications   Prior to Admission medications   Medication Sig Start Date End Date Taking? Authorizing Provider  butalbital-acetaminophen-caffeine (FIORICET, ESGIC) 50-325-40 MG tablet TAKE 2 TABLETS BY MOUTH EVERY 4 HOURS AS NEEDED 04/22/15  Yes Historical Provider, MD  famotidine (PEPCID) 20 MG tablet TAKE 1 TABLET (20 MG TOTAL) BY MOUTH TWO (2) TIMES A DAY. 06/11/15  Yes Historical Provider, MD  levETIRAcetam (KEPPRA) 750 MG tablet Take 1 tablet (750 mg total) by mouth 2 (two) times daily. 07/28/15  Yes Asa Lente, MD  mirtazapine (REMERON) 15 MG tablet TAKE 1 TABLET (15 MG TOTAL) BY MOUTH NIGHTLY. 06/11/15  Yes Historical Provider, MD  ondansetron (ZOFRAN) 4 MG tablet Take 4 mg by mouth every 8  (eight) hours as needed for nausea or vomiting. Reported on 07/21/2015   Yes Historical Provider, MD  rizatriptan (MAXALT-MLT) 5 MG disintegrating tablet Take 1 tablet (5 mg total) by mouth as needed for migraine. May repeat in 2 hours if needed 04/28/15  Yes Asa Lente, MD  sertraline (ZOLOFT) 25 MG tablet Take 25 mg by mouth daily. 06/01/15 05/31/16 Yes Historical Provider, MD  SUMAtriptan (IMITREX) 50 MG tablet Take 50 mg by mouth every 2 (two) hours as needed. Reported on 07/28/2015 03/08/15  Yes Historical Provider, MD  promethazine (PHENERGAN) 12.5 MG tablet Take 12.5 mg by mouth every 12 (twelve) hours as needed for nausea or vomiting.  06/11/15 06/16/15  Historical Provider, MD  tamsulosin (FLOMAX) 0.4 MG CAPS capsule Take 1 capsule (0.4 mg total) by mouth daily. Patient not taking: Reported on 09/22/2015 07/21/15   Alfredo Martinez, MD   Meds Ordered and Administered this Visit   Medications  ondansetron (ZOFRAN-ODT) disintegrating tablet 8 mg (8 mg Oral Given 09/01/15 1417)    BP 118/79 mmHg  Pulse 76  Temp(Src) 97.7 F (36.5 C) (Oral)  Resp 18  Ht  (1.702 m)  Wt 135 lb (61.236 kg)  BMI 21.14 kg/m2  SpO2 100%  LMP 08/07/2015 No data found.   Physical Exam  Constitutional: She appears well-developed.  Cardiovascular: Normal rate, regular rhythm, normal heart sounds and intact distal pulses.   No murmur heard. Pulmonary/Chest:  Effort normal and breath sounds normal. No respiratory distress. She has no wheezes. She has no rales.  Abdominal: Soft. Bowel sounds are normal. She exhibits no distension and no mass. There is tenderness (to right upper quadrant and right flank/CVA). There is no rebound and no guarding.  Neurological: She is alert.  Skin: Skin is warm and dry. No rash noted. She is not diaphoretic. No erythema.  Nursing note and vitals reviewed.   ED Course  Procedures (including critical care time)  Labs Review Labs Reviewed  URINE CULTURE - Abnormal; Notable  for the following:    Culture   (*)    Value: 4,000 COLONIES/mL INSIGNIFICANT GROWTH [facility]    All other components within normal limits  URINALYSIS COMPLETEWITH MICROSCOPIC ([facility] ONLY) - Abnormal; Notable for the following:    Color, Urine STRAW (*)    APPearance HAZY (*)    Hgb urine dipstick 1+ (*)    Bacteria, UA FEW (*)    Squamous Epithelial / LPF 6-30 (*)    All other components within normal limits  COMPREHENSIVE METABOLIC PANEL - Abnormal; Notable for the following:    Total Bilirubin 1.4 (*)    All other components within normal limits  CBC WITH DIFFERENTIAL/PLATELET    Imaging Review No results found.   Visual Acuity Review  Right Eye Distance:   Left Eye Distance:   Bilateral Distance:    Right Eye Near:   Left Eye Near:    Bilateral Near:         MDM   1. Right lower quadrant abdominal pain   2. Right flank pain    Discharge Medication List as of 09/01/2015  3:49 PM     1. Lab results and possible etiologies/diagnosis reviewed with patient 2. rx as per orders above; reviewed possible side effects, interactions, risks and benefits  3. Recommend supportive treatment with increase fluids 4. Recommend CT scan abdomen/pelvis (to be done today); patient will be notified of test results and any further management 4. Follow-up prn if symptoms worsen or don't improve    Payton Mccallumrlando Annet Manukyan, MD 10/04/15 1549

## 2015-10-29 ENCOUNTER — Telehealth: Payer: Self-pay | Admitting: *Deleted

## 2015-10-29 MED ORDER — LEVETIRACETAM 750 MG PO TABS: 750.0000 mg | ORAL_TABLET | Freq: Two times a day (BID) | ORAL | Status: DC

## 2015-10-29 NOTE — Telephone Encounter (Signed)
90 day Keppra rx. escribed to [facility] per faxed request/fim

## 2015-11-04 ENCOUNTER — Telehealth: Payer: Self-pay

## 2015-11-04 NOTE — Telephone Encounter (Signed)
Pt called stating she feels like she is having bladder spasms after she cath's herself. Pt also stated that she is having to cath herself more frequently. Pt stated that she is having some nausea and lower abd pain. Pt denied vomiting, fever, chills. Pt will come into clinic tomorrow for a u/a and cx.

## 2015-12-03 ENCOUNTER — Ambulatory Visit (INDEPENDENT_AMBULATORY_CARE_PROVIDER_SITE_OTHER): Payer: 59

## 2015-12-03 VITALS — BP 115/78 | HR 80 | Temp 98.3°F | Wt 135.1 lb

## 2015-12-03 DIAGNOSIS — N390 Urinary tract infection, site not specified: Secondary | ICD-10-CM

## 2015-12-03 LAB — MICROSCOPIC EXAMINATION

## 2015-12-03 LAB — URINALYSIS, COMPLETE
Bilirubin, UA: NEGATIVE
Glucose, UA: NEGATIVE
Leukocytes, UA: NEGATIVE
Nitrite, UA: NEGATIVE
Specific Gravity, UA: >1.03 — ABNORMAL HIGH (ref 1.005–1.030)
Urobilinogen, Ur: 1 mg/dL (ref 0.2–1.0)
pH, UA: 5.5 (ref 5.0–7.5)

## 2015-12-03 NOTE — Progress Notes (Signed)
In and Out Catheterization  Patient is present today for a I & O catheterization due to possible UTI. Patient was cleaned and prepped in a sterile fashion with betadine and Lidocaine 2% jelly was instilled into the urethra.  A 14FR cath was inserted no complications were noted , 100ml of urine return was noted, urine was amber in color. A clean urine sample was collected for u/a and cx. Bladder was drained  And catheter was removed with out difficulty.    Preformed by: Christine Stackshelsea Watkins, LPN   Follow up/ Additional notes: Pt came in today with c/o "feeling horrible". Pt described UTI like s/s to be back/flank pain, lower abd pain, nausea, chills. Pt does perform CIC daily.  Blood pressure 115/78, pulse 80, temperature 98.3 F (36.8 C), weight 135 lb 1.6 oz (61.3 kg).

## 2015-12-07 LAB — CULTURE, URINE COMPREHENSIVE

## 2015-12-08 ENCOUNTER — Telehealth: Payer: Self-pay

## 2015-12-08 NOTE — Telephone Encounter (Signed)
Spoke with pt in reference to f/u with GYN. Pt voiced understanding.

## 2015-12-08 NOTE — Telephone Encounter (Signed)
Spoke with pt in reference to u/a and cx. Pt stated that she still feels really bad. Pt also stated that GYN started her on progesterone and was not sure if that could be contributing to her s/s.

## 2015-12-08 NOTE — Telephone Encounter (Signed)
-----   Message from Vanna ScotlandAshley Brandon, MD sent at 12/07/2015  3:40 PM EDT ----- Based on her UA and UCx, it does not look like an infection.  The low colony count of staph epi is likely contaminant.  Is she still symptomatic?  Vanna ScotlandAshley Brandon, MD

## 2015-12-08 NOTE — Telephone Encounter (Signed)
Perhaps.  Please have her contact her GYN.  Vanna ScotlandAshley Hildegarde Dunaway, MD

## 2015-12-08 NOTE — Telephone Encounter (Signed)
LMOM

## 2015-12-28 ENCOUNTER — Ambulatory Visit: Payer: 59 | Admitting: Neurology

## 2016-01-03 ENCOUNTER — Ambulatory Visit: Payer: 59 | Attending: Psychiatry | Admitting: Physical Therapy

## 2016-01-03 DIAGNOSIS — M6281 Muscle weakness (generalized): Secondary | ICD-10-CM | POA: Insufficient documentation

## 2016-01-03 DIAGNOSIS — M545 Low back pain: Secondary | ICD-10-CM

## 2016-01-03 DIAGNOSIS — R262 Difficulty in walking, not elsewhere classified: Secondary | ICD-10-CM | POA: Diagnosis present

## 2016-01-04 NOTE — Therapy (Addendum)
[facility] [address] [place], [place], [address] Phone: [phone]   Fax:  [phone]  Physical Therapy Evaluation  Patient Details  Name: Christine Chavez MRN: [id number] Date of Birth: 04-14-1981 Referring Provider: Leta Speller, MD  Encounter Date: 01/03/2016    Past Medical History:  Diagnosis Date  . Headache   . Renal disorder   . Vision abnormalities     Past Surgical History:  Procedure Laterality Date  . ANTERIOR CRUCIATE LIGAMENT REPAIR  1997  . EXPLORATORY LAPAROTOMY  1999  . TONSILLECTOMY      There were no vitals filed for this visit.          Pt. reports she has been having increase LE muscle weakness/ fatigue with gait issues since 02/2015.  Pt. reports having to cath herself since hospitalization 05/2015.  Pt. c/o 5/10 low back pain currently at rest.  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     Therex: See HEP (handouts provided)- rest breaks required.     Pt. is a pleasant 35 y/o female with progressive UE/LE muscle weakness (L side weaker than R) over past 10 months.  Pt. 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  Pt. has required self-catherization since hosptial visit in 05/2015 due to difficulty emptying bladder.  BP 117/80, HR 71 bpm.  Pt. reports feeling quad muscle fatigue but "stiffness" in lower leg with standing/walking tasks.  No swelling noted in B lower legs with generalized muscle fasciculations.  B UE/LE AROM WFL and R UE muscle strength grossly 5/5 MMT except sh. flexion 4+/5 MMT, L UE strength grossly 4/5 MMT.  B LE muscle strength grossly 4/5 MMT except knee flexion 4+/5.  Pt. requires extra time/ multiple attempts to stand from chair with definite need of UE assist.  Berg balance test: 38/56 (marked LE muscle fatigue).  Pt. ambulates with limited L/R hip flexion and step pattern.  Lack of DF/ heel  strike during swingthrough phase of gait pattern.  Pt. requires use of w/c for community mobility and will benefit from use of rollator for short distance ambulaton.  LEFS: 33 out of 80.  Pt. will benefit from skilled PT services to increase B LE muscle control/ strength to improve functional mobility/ safety with walking and daily household tasks.            PT Long Term Goals - 01/10/16 1315      PT LONG TERM GOAL #1   Title Pt. I with HEP to increase B LE muscle strength 1/2 muscle grade to improve overall muscle endurance/ mobility.     Baseline B UE/LE AROM WFL and R UE muscle strength grossly 5/5 MMT except sh. flexion 4+/5 MMT, L UE strength grossly 4/5 MMT.  B LE muscle strength grossly 4/5 MMT except knee flexion 4+/5.    Time 4   Period Weeks   Status New     PT LONG TERM GOAL #2   Title Pt. will increase Berg balance test to >45/56 to improve gait with least restrictive device/ promote decrease fall risk.     Baseline Berg 38/56 on 01/03/16   Time 4   Period Weeks   Status New     PT LONG TERM GOAL #3   Title Pt. will increase LEFS score to >45 out of 80 to improve functional mobility.  Baseline LEFS: 33 out of 80 on 9/18   Time 4   Period Weeks   Status New     PT LONG TERM GOAL #4   Title Pt. able to ambulate 10 minutes with appropriate assistive device and more normalized gait pattern safely.     Baseline LImited standing/ walking endurance.  Benefits from rollator with gait around house and w/c in community.     Time 4   Period Weeks   Status New         Patient will benefit from skilled therapeutic intervention in order to improve the following deficits and impairments:  Abnormal gait, Improper body mechanics, Pain, Postural dysfunction, Decreased mobility, Decreased coordination, Decreased endurance, Decreased activity tolerance, Decreased range of motion, Decreased strength, Decreased safety awareness, Decreased balance, Difficulty walking  Visit  Diagnosis: Muscle weakness (generalized)  Difficulty in walking, not elsewhere classified  Midline low back pain, with sciatica presence unspecified     Problem List Patient Active Problem List   Diagnosis Date Noted  . Numbness 07/28/2015  . Bladder retention 06/23/2015  . Abdominal pain 06/04/2015  . Dizziness 05/18/2015  . Neck pain 05/18/2015  . Complicated migraine 04/28/2015  . Other fatigue 04/28/2015  . Abnormal finding on MRI of brain 04/28/2015  . D (diarrhea) 03/29/2015  . H/O disease 03/29/2015  . Abnormal weight loss 03/29/2015  . Muscle weakness (generalized) 03/14/2015  . Headache, migraine 03/10/2015   Cammie McgeeMichael C Avalynne Diver, PT, DPT # [id number] 01/10/2016, 1:21 PM  [facility] [address] [place], [place], [id number] Phone: [phone]   Fax:  [phone]  Name: Porfirio MylarMelissa Dawn Molesky MRN: [id number] Date of Birth: [birth year]

## 2016-01-05 ENCOUNTER — Ambulatory Visit: Payer: 59 | Admitting: Physical Therapy

## 2016-01-05 DIAGNOSIS — M545 Low back pain: Secondary | ICD-10-CM

## 2016-01-05 DIAGNOSIS — M6281 Muscle weakness (generalized): Secondary | ICD-10-CM | POA: Diagnosis not present

## 2016-01-05 DIAGNOSIS — R262 Difficulty in walking, not elsewhere classified: Secondary | ICD-10-CM

## 2016-01-06 NOTE — Therapy (Addendum)
[facility] [address] [place], [place], [address] Phone: [phone]   Fax:  [phone]  Physical Therapy Treatment  Patient Details  Name: Christine MylarMelissa Dawn Chavez MRN: [id number] Date of Birth: 1980/11/12 Referring Provider: Leta SpellerNuhad Elias Abou Zeid, MD  Encounter Date: 01/05/2016    Past Medical History:  Diagnosis Date  . Headache   . Renal disorder   . Vision abnormalities     Past Surgical History:  Procedure Laterality Date  . ANTERIOR CRUCIATE LIGAMENT REPAIR  1997  . EXPLORATORY LAPAROTOMY  1999  . TONSILLECTOMY      There were no vitals filed for this visit.    Pt. reports 4-5/10 low back discomfort.  Pt. reports B LE muscle fatigue after initial evaluation but ambulates into PT with slow, antalgic gait pattern.      OBJECTIVE: There.ex.: Scifit L1-2 10 min. B UE/LE (slow but consistent cadence)- no rest breaks (no charge/ warm-up).  Issued page #1-3 of core stability ex. Program.  Supine LE muscle stretches 5 min.  Neuro. Mm.: reviewed HEP (wt. Shifting), cone touches in //-bars with and without UE assist.  Toe walking forward in //-bars/ lateral walking 10 feet x 4.  Walking in clinic with use of rollator and cuing for increase BOS/ step pattern/ heel strike.        Pt response for medical necessity:  Pt. Benefits from LE ex. Program to promote increase strength/ safety with daily household chores/ walking.  Pt. Requires rollator for safe ambulation at this time.     Pt. ambulates with use of rollator in PT clinic with heavy use of B UE/ forward leaning posture.  Good technique with TrA muscle contraction with addition of core progression ex. program.  Less overall LE muscle fasciculations as compared to initial evaluation but muscle fatigue present.  Extra time to safely turn/ transfer into passenger seat of car.          PT Long Term Goals - 01/10/16 1315      PT LONG TERM GOAL #1   Title Pt. I with HEP to  increase B LE muscle strength 1/2 muscle grade to improve overall muscle endurance/ mobility.     Baseline B UE/LE AROM WFL and R UE muscle strength grossly 5/5 MMT except sh. flexion 4+/5 MMT, L UE strength grossly 4/5 MMT.  B LE muscle strength grossly 4/5 MMT except knee flexion 4+/5.    Time 4   Period Weeks   Status New     PT LONG TERM GOAL #2   Title Pt. will increase Berg balance test to >45/56 to improve gait with least restrictive device/ promote decrease fall risk.     Baseline Berg 38/56 on 01/03/16   Time 4   Period Weeks   Status New     PT LONG TERM GOAL #3   Title Pt. will increase LEFS score to >45 out of 80 to improve functional mobility.     Baseline LEFS: 33 out of 80 on 9/18   Time 4   Period Weeks   Status New     PT LONG TERM GOAL #4   Title Pt. able to ambulate 10 minutes with appropriate assistive device and more normalized gait pattern safely.     Baseline LImited standing/ walking endurance.  Benefits from rollator with gait around house and w/c in community.     Time 4   Period Weeks   Status New  Patient will benefit from skilled therapeutic intervention in order to improve the following deficits and impairments:  Abnormal gait, Improper body mechanics, Pain, Postural dysfunction, Decreased mobility, Decreased coordination, Decreased endurance, Decreased activity tolerance, Decreased range of motion, Decreased strength, Decreased safety awareness, Decreased balance, Difficulty walking  Visit Diagnosis: Muscle weakness (generalized)  Difficulty in walking, not elsewhere classified  Midline low back pain, with sciatica presence unspecified     Problem List Patient Active Problem List   Diagnosis Date Noted  . Numbness 07/28/2015  . Bladder retention 06/23/2015  . Abdominal pain 06/04/2015  . Dizziness 05/18/2015  . Neck pain 05/18/2015  . Complicated migraine 04/28/2015  . Other fatigue 04/28/2015  . Abnormal finding on MRI of brain  04/28/2015  . D (diarrhea) 03/29/2015  . H/O disease 03/29/2015  . Abnormal weight loss 03/29/2015  . Muscle weakness (generalized) 03/14/2015  . Headache, migraine 03/10/2015   Cammie Mcgee, PT, DPT # [id number] 01/10/2016, 1:33 PM  [place] [address] [place], [place], [address] Phone: [phone]   Fax:  [phone]  Name: Christine Chavez MRN: [id number] Date of Birth: [birth year]

## 2016-01-10 ENCOUNTER — Ambulatory Visit: Payer: 59 | Admitting: Physical Therapy

## 2016-01-10 ENCOUNTER — Encounter: Payer: Self-pay | Admitting: Physical Therapy

## 2016-01-10 DIAGNOSIS — M545 Low back pain: Secondary | ICD-10-CM

## 2016-01-10 DIAGNOSIS — M6281 Muscle weakness (generalized): Secondary | ICD-10-CM | POA: Diagnosis not present

## 2016-01-10 DIAGNOSIS — R262 Difficulty in walking, not elsewhere classified: Secondary | ICD-10-CM

## 2016-01-10 NOTE — Addendum Note (Signed)
Addended by: Cammie McgeeSHERK, Kiely Cousar C on: 01/10/2016 01:25 PM   Modules accepted: Orders

## 2016-01-11 NOTE — Therapy (Signed)
[facility] [address] [place], [place], [address] Phone: [phone]   Fax:  [phone]  Physical Therapy Treatment  Patient Details  Name: Christine MylarMelissa Dawn Chavez MRN: [id number] Date of Birth: 11/15/1980 Referring Provider: Leta SpellerNuhad Elias Abou Zeid, MD  Encounter Date: 01/10/2016      PT End of Session - 01/11/16 1519    Visit Number 3   Number of Visits 8   Date for PT Re-Evaluation 01/31/16   PT Start Time 1305   PT Stop Time 1402   PT Time Calculation (min) 57 min   Equipment Utilized During Treatment Gait belt   Activity Tolerance Patient limited by fatigue;Patient limited by pain   Behavior During Therapy [facility] for tasks assessed/performed      Past Medical History:  Diagnosis Date  . Headache   . Renal disorder   . Vision abnormalities     Past Surgical History:  Procedure Laterality Date  . ANTERIOR CRUCIATE LIGAMENT REPAIR  1997  . EXPLORATORY LAPAROTOMY  1999  . TONSILLECTOMY      There were no vitals filed for this visit.      Subjective Assessment - 01/10/16 1341    Subjective Pt. states she has been having a rough 24 hours and [facility] and discharged home early this morning.     Pertinent History refer to [facility]   Limitations Lifting;Standing;Walking;House hold activities   Patient Stated Goals Increase LE muscle strength/ improve balance and gait.     Currently in Pain? Yes   Pain Score 4    Pain Location Back   Pain Orientation Lower;Mid     Vitals: BP: 126/76.  HR: 97 bpm.  O2 sat: 99%   OBJECTIVE: There.ex.: Scifit L1 10 min. B UE/LE (slow but consistent cadence)- no rest breaks (no charge/ warm-up). Discussed core stability ex. Program.  Seated LAQ/ hip flexion/ heel raises/ gastroc stretches/ reassessment of DF AROM.  Seated hip adduction with ball and abd. With YTB 20x each.  Sit to stand from green chair with mod. A  for verbal/tactile cuing.  Gait training:  Pts. rollator sized for proper height to ensure safety.  Walking in clinic with use of rollator and cuing for increase BOS/ step pattern/ heel strike.  Pt. Has difficulty initiated L hip flexion/ step pattern during R LE stance.                     Pt response for medical necessity:  Pt. Benefits from LE ex. Program to promote increase strength/ safety with daily household chores/ walking.  Pt. Requires rollator for safe ambulation at this time.        PT Long Term Goals - 01/10/16 1315      PT LONG TERM GOAL #1   Title Pt. I with HEP to increase B LE muscle strength 1/2 muscle grade to improve overall muscle endurance/ mobility.     Baseline B UE/LE AROM WFL and R UE muscle strength grossly 5/5 MMT except sh. flexion 4+/5 MMT, L UE strength grossly 4/5 MMT.  B LE muscle strength grossly 4/5 MMT except knee flexion 4+/5.    Time 4   Period Weeks   Status New     PT LONG TERM GOAL #2   Title Pt. will increase Berg balance test to >45/56 to improve gait with least restrictive device/ promote decrease fall risk.  Baseline Berg 38/56 on 01/03/16   Time 4   Period Weeks   Status New     PT LONG TERM GOAL #3   Title Pt. will increase LEFS score to >45 out of 80 to improve functional mobility.     Baseline LEFS: 33 out of 80 on 9/18   Time 4   Period Weeks   Status New     PT LONG TERM GOAL #4   Title Pt. able to ambulate 10 minutes with appropriate assistive device and more normalized gait pattern safely.     Baseline LImited standing/ walking endurance.  Benefits from rollator with gait around house and w/c in community.     Time 4   Period Weeks   Status New               Plan - 01/11/16 1520    Clinical Impression Statement Pt. significantly limited by B LE muscle weakness/ fatigue.  Muscle fasciculations noted during all aspects of sit to stands/ standing and walking tasks in PT clinic.  Pt. has difficulty with initiating  swing through phase of gait with limited ankle DF.  Pt. able to demosntrate ankle ROM to neutral positioning and passively into min. DF.  Pt. requires Mod A to ambulate short distances and sat on rollator seat at end of tx. to assist to car.  No change in pts. HEP and PT focused on pt. education/ seated ex./ safety with walking.  Rollator properly sized for pt. to ensure safety with household ambulation.     Rehab Potential Fair   PT Frequency 2x / week   PT Duration 4 weeks   PT Treatment/Interventions ADLs/Self Care Home Management;Gait training;Stair training;Functional mobility training;Therapeutic activities;Therapeutic exercise;Balance training;Patient/family education;Neuromuscular re-education;Manual techniques;Wheelchair mobility training;Passive range of motion;Energy conservation   PT Next Visit Plan Progress HEP/ proprioceptive and balance ex.        Patient will benefit from skilled therapeutic intervention in order to improve the following deficits and impairments:  Abnormal gait, Improper body mechanics, Pain, Postural dysfunction, Decreased mobility, Decreased coordination, Decreased endurance, Decreased activity tolerance, Decreased range of motion, Decreased strength, Decreased safety awareness, Decreased balance, Difficulty walking  Visit Diagnosis: Muscle weakness (generalized)  Difficulty in walking, not elsewhere classified  Midline low back pain, with sciatica presence unspecified     Problem List Patient Active Problem List   Diagnosis Date Noted  . Numbness 07/28/2015  . Bladder retention 06/23/2015  . Abdominal pain 06/04/2015  . Dizziness 05/18/2015  . Neck pain 05/18/2015  . Complicated migraine 04/28/2015  . Other fatigue 04/28/2015  . Abnormal finding on MRI of brain 04/28/2015  . D (diarrhea) 03/29/2015  . H/O disease 03/29/2015  . Abnormal weight loss 03/29/2015  . Muscle weakness (generalized) 03/14/2015  . Headache, migraine 03/10/2015   Cammie Mcgee, PT, DPT # [id number] 01/11/2016, 3:25 PM  [place] [facility] [address] [place], [place], [address] Phone: [phone]   Fax:  [phone]  Name: Christine Chavez MRN: [id number] Date of Birth: Dec 05, 1980

## 2016-01-12 ENCOUNTER — Ambulatory Visit: Payer: 59 | Admitting: Physical Therapy

## 2016-01-17 ENCOUNTER — Ambulatory Visit: Payer: 59 | Attending: Psychiatry | Admitting: Physical Therapy

## 2016-01-17 ENCOUNTER — Encounter: Payer: Self-pay | Admitting: Physical Therapy

## 2016-01-17 DIAGNOSIS — M6281 Muscle weakness (generalized): Secondary | ICD-10-CM | POA: Diagnosis present

## 2016-01-17 DIAGNOSIS — R262 Difficulty in walking, not elsewhere classified: Secondary | ICD-10-CM | POA: Diagnosis present

## 2016-01-17 NOTE — Therapy (Signed)
[facility] [address] [place], [place], [address] Phone: [phone]   Fax:  [phone]  Physical Therapy Treatment  Patient Details  Name: Christine Chavez MRN: [id number] Date of Birth: May 14, 1980 Referring Provider: Leta SpellerNuhad Elias Abou Zeid, MD  Encounter Date: 01/17/2016      PT End of Session - 01/17/16 1714    Visit Number 4   Number of Visits 8   Date for PT Re-Evaluation 01/31/16   PT Start Time 1300   PT Stop Time 1354   PT Time Calculation (min) 54 min   Equipment Utilized During Treatment Gait belt   Activity Tolerance Patient tolerated treatment well;Patient limited by fatigue   Behavior During Therapy [facility] for tasks assessed/performed      Past Medical History:  Diagnosis Date  . Headache   . Renal disorder   . Vision abnormalities     Past Surgical History:  Procedure Laterality Date  . ANTERIOR CRUCIATE LIGAMENT REPAIR  1997  . EXPLORATORY LAPAROTOMY  1999  . TONSILLECTOMY      There were no vitals filed for this visit.      Subjective Assessment - 01/17/16 1309    Subjective Pt states that she has been recovering from her procedure last week, with mild abdominal/pelvic pain noted. States that her strength/sensation symptoms have not changed much since she started coming to therapy. She has multiple appointments scheduled this week for diagnositcs.   Pertinent [facility]   Limitations Lifting;Standing;Walking;House hold activities   Patient Stated Goals Increase LE muscle strength/ improve balance and gait.     Currently in Pain? Yes   Pain Score 3    Pain Location Other (Comment)  pelvic/deep abdominal     Objective:  Therapeutic Exercise:  Neuromuscular Re-ed: In // bars with SPT CGA/SBA: ambulation without AD, no UE use 8212ft x4. Amb with increased step length 5812ft x4. Amb with emphasis on toe off/heel strike x4. Amb with exaggerated hip  flexion to approx. 90 deg 6312ft x8. Lateral steps 3812ft x4 with emphasis on neutral foot. Toe taps on 6'' step no UE support (7 minutes before requiring seated rest break). Step ups/down on unstable surface/blue air ex (5 minutes). Heel raises bilaterally x10.   Therapeutic Exercise: Seated knee ext #4 x10 R/L. Seated hip flexion #4 x20 R/L. Nu Step 10 minutes Level 3 (no charge/cool down).  Pt response for medical necessity: Pt is not limited by pain this session but requires several seated rest breaks secondary to significant muscle fasciculations/fatigue on bilateral LE following standing/balance exercise. Overall she demonstrates improved exercise tolerance this session as compared to previous session. By the end of the session, pt demonstrates limited hip flexion endurance/strength esp. noticeable during gait cycle where she relies on knee/ankle for foot clearance.      PT Long Term Goals - 01/10/16 1315      PT LONG TERM GOAL #1   Title Pt. I with HEP to increase B LE muscle strength 1/2 muscle grade to improve overall muscle endurance/ mobility.     Baseline B UE/LE AROM WFL and R UE muscle strength grossly 5/5 MMT except sh. flexion 4+/5 MMT, L UE strength grossly 4/5 MMT.  B LE muscle strength grossly 4/5 MMT except knee flexion 4+/5.    Time 4   Period Weeks   Status New     PT LONG TERM GOAL #2   Title Pt. will increase  Berg balance test to >45/56 to improve gait with least restrictive device/ promote decrease fall risk.     Baseline Berg 38/56 on 01/03/16   Time 4   Period Weeks   Status New     PT LONG TERM GOAL #3   Title Pt. will increase LEFS score to >45 out of 80 to improve functional mobility.     Baseline LEFS: 33 out of 80 on 9/18   Time 4   Period Weeks   Status New     PT LONG TERM GOAL #4   Title Pt. able to ambulate 10 minutes with appropriate assistive device and more normalized gait pattern safely.     Baseline LImited standing/ walking endurance.  Benefits  from rollator with gait around house and w/c in community.     Time 4   Period Weeks   Status New            Plan - 01/17/16 1715    Clinical Impression Statement Pt with improved tolerance to standing exercises this session; able to complete multiple trials of standing/balance tasks with approx 5-8 minute exercise tolerance before onset of significant LE muscle fasciulations in bilateral LE. Pt responsive to cueings for normalized gait pattern but limited by quick onset of fatigue; tendency for limited hip flexion with all tasks including ambulation and reliance on circumduction for repeated tasks requiring significnat foot clearance.    Rehab Potential Fair   PT Frequency 2x / week   PT Duration 4 weeks   PT Treatment/Interventions ADLs/Self Care Home Management;Gait training;Stair training;Functional mobility training;Therapeutic activities;Therapeutic exercise;Balance training;Patient/family education;Neuromuscular re-education;Manual techniques;Wheelchair mobility training;Passive range of motion;Energy conservation   PT Next Visit Plan Progress HEP/ proprioceptive and balance ex.     Consulted and Agree with Plan of Care Patient      Patient will benefit from skilled therapeutic intervention in order to improve the following deficits and impairments:  Abnormal gait, Improper body mechanics, Pain, Postural dysfunction, Decreased mobility, Decreased coordination, Decreased endurance, Decreased activity tolerance, Decreased range of motion, Decreased strength, Decreased safety awareness, Decreased balance, Difficulty walking  Visit Diagnosis: Muscle weakness (generalized)  Difficulty in walking, not elsewhere classified     Problem List Patient Active Problem List   Diagnosis Date Noted  . Numbness 07/28/2015  . Bladder retention 06/23/2015  . Abdominal pain 06/04/2015  . Dizziness 05/18/2015  . Neck pain 05/18/2015  . Complicated migraine 04/28/2015  . Other fatigue  04/28/2015  . Abnormal finding on MRI of brain 04/28/2015  . D (diarrhea) 03/29/2015  . H/O disease 03/29/2015  . Abnormal weight loss 03/29/2015  . Muscle weakness (generalized) 03/14/2015  . Headache, migraine 03/10/2015    Cammie Mcgee, PT, DPT # [id number] Vernona Rieger Carlyle Mcelrath SPT 01/17/2016, 5:23 PM  [facility] [address] [place], [place], [address] Phone: [phone]   Fax:  [phone]  Name: Christine Chavez MRN: [id number] Date of Birth: 1981-02-14

## 2016-01-19 ENCOUNTER — Ambulatory Visit: Payer: 59 | Admitting: Physical Therapy

## 2016-01-24 ENCOUNTER — Ambulatory Visit: Payer: 59 | Admitting: Physical Therapy

## 2016-01-24 DIAGNOSIS — M6281 Muscle weakness (generalized): Secondary | ICD-10-CM

## 2016-01-24 DIAGNOSIS — R262 Difficulty in walking, not elsewhere classified: Secondary | ICD-10-CM

## 2016-01-24 NOTE — Therapy (Addendum)
[facility] [address] [place], [place], [address] Phone: [phone]   Fax:  [phone]  Physical Therapy Treatment  Patient Details  Name: Christine Chavez MRN: [id number] Date of Birth: January 10, 1981 Referring Provider: Adele Schilder, MD  Encounter Date: 01/24/2016    Past Medical History:  Diagnosis Date  . Headache   . Renal disorder   . Vision abnormalities     Past Surgical History:  Procedure Laterality Date  . ANTERIOR CRUCIATE LIGAMENT REPAIR  1997  . EXPLORATORY LAPAROTOMY  1999  . TONSILLECTOMY      There were no vitals filed for this visit.    Pt. reports she is not feeling her legs when she stands up and walks with rollator.  Pt. feels she is floating above her legs.  Pt. c/o persistent back pain 4/10 at rest.      Therapeutic Exercise:  Neuromuscular Re-ed: In // bars with SPT CGA/SBA: ambulation without AD, no UE use 35f x4. Amb with increased step length 179fx4. Amb with emphasis on toe off/heel strike x4. Amb with exaggerated hip flexion to approx. 90 deg 1264f8. Lateral steps 38f45f with emphasis on neutral foot.Sit to stands from green chair 10x.  Berg balance reassessment 43/56.  Airex step ups (forward/lateral).    Therapeutic Exercise: RTB monster walks 10 feet x 4 in //-bars.  Standing hip flexion 20x (maintaining  LE midline position).  Seated knee ext #4 x10 R/L. Seated hip abd. With RTB 20x. Nu Step 10 minutes Level 3 (no charge/cool down).  Discussed supine ex. Program.  Prone mobs. To mid-thoracic/lumbar spine grade II-III generalized with STM over lumbar paraspinals.    Gait training: amb. In clinic with use of rollator and instruct in upright posture/ cadence/ step pattern/ heel strike.    Pt response for medical necessity:  Limited overall muscle endurance today with persistent c/o LBP.  Decrease step pattern/ heel strike with increase distance walked and heavy use of B UE on  rollator for safety.     Pt. has shown improvement with Berg balance assessment (43/56) as compared to initial evaluation.  Significant LE muscle fatigue with standing ther.ex./ balance testing requiring several short seated rest breaks.  Pt. requires use of rollator with all aspects of walking for safety.  Limited L/R hip flexion with step through phase of gait.  Persistent c/o LBP during tx. session with slight improvement reported during prone grade II-III mobs. and STM.           PT Long Term Goals - 01/25/16 1047      PT LONG TERM GOAL #1   Title Pt. I with HEP to increase B LE muscle strength 1/2 muscle grade to improve overall muscle endurance/ mobility.     Baseline B UE/LE AROM WFL and R UE muscle strength grossly 5/5 MMT except sh. flexion 4+/5 MMT, L UE strength grossly 4/5 MMT.  B LE muscle strength grossly 4/5 MMT except knee flexion 4+/5.    Time 4   Period Weeks   Status Not Met     PT LONG TERM GOAL #2   Title Pt. will increase Berg balance test to >45/56 to improve gait with least restrictive device/ promote decrease fall risk.     Baseline Berg 43/56 on 01/24/16   Time 4   Period Weeks   Status Not Met     PT LONG TERM GOAL #3   Title Pt. will increase LEFS  score to >45 out of 80 to improve functional mobility.     Baseline LEFS: 33 out of 80 on 9/18   Time 4   Period Weeks   Status On-going     PT LONG TERM GOAL #4   Title Pt. able to ambulate 10 minutes with appropriate assistive device and more normalized gait pattern safely.     Baseline LImited standing/ walking endurance.  Benefits from rollator with gait around house and w/c in community.     Time 4   Period Weeks   Status Not Met             Patient will benefit from skilled therapeutic intervention in order to improve the following deficits and impairments:  Abnormal gait, Improper body mechanics, Pain, Postural dysfunction, Decreased mobility, Decreased coordination, Decreased endurance,  Decreased activity tolerance, Decreased range of motion, Decreased strength, Decreased safety awareness, Decreased balance, Difficulty walking  Visit Diagnosis: Muscle weakness (generalized)  Difficulty in walking, not elsewhere classified     Problem List Patient Active Problem List   Diagnosis Date Noted  . Numbness 07/28/2015  . Bladder retention 06/23/2015  . Abdominal pain 06/04/2015  . Dizziness 05/18/2015  . Neck pain 05/18/2015  . Complicated migraine 60/47/9987  . Other fatigue 04/28/2015  . Abnormal finding on MRI of brain 04/28/2015  . D (diarrhea) 03/29/2015  . H/O disease 03/29/2015  . Abnormal weight loss 03/29/2015  . Muscle weakness (generalized) 03/14/2015  . Headache, migraine 03/10/2015   [name], PT, DPT # [id number] 02/08/2016, 10:48 AM  [facility] [address] [place], [place], [address] Phone: [phone]   Fax:  [phone]  Name: Christine Chavez MRN: [id number] Date of Birth: 11-01-1980

## 2016-01-31 ENCOUNTER — Ambulatory Visit: Payer: 59 | Admitting: Physical Therapy

## 2016-02-02 ENCOUNTER — Ambulatory Visit: Payer: 59 | Admitting: Physical Therapy

## 2016-02-07 ENCOUNTER — Ambulatory Visit: Payer: 59 | Admitting: Physical Therapy

## 2016-02-07 DIAGNOSIS — R262 Difficulty in walking, not elsewhere classified: Secondary | ICD-10-CM

## 2016-02-07 DIAGNOSIS — M6281 Muscle weakness (generalized): Secondary | ICD-10-CM

## 2016-02-08 NOTE — Therapy (Addendum)
[facility] [address] [place], [place], [address] Phone: [phone]   Fax:  [phone]  Physical Therapy Treatment  Patient Details  Name: Christine Chavez MRN: [id number] Date of Birth: [birth year] Referring Provider: Leta Speller, MD  Encounter Date: 02/07/2016      PT End of Session - 02/08/16 1634    Visit Number 6   Number of Visits 13   Date for PT Re-Evaluation 03/06/16   PT Start Time 1258   PT Stop Time 1351   PT Time Calculation (min) 53 min   Equipment Utilized During Treatment Gait belt   Activity Tolerance Patient tolerated treatment well;Patient limited by fatigue   Behavior During Therapy [facility] for tasks assessed/performed      Past Medical History:  Diagnosis Date  . Headache   . Renal disorder   . Vision abnormalities     Past Surgical History:  Procedure Laterality Date  . ANTERIOR CRUCIATE LIGAMENT REPAIR  1997  . EXPLORATORY LAPAROTOMY  1999  . TONSILLECTOMY      There were no vitals filed for this visit.      Subjective Assessment - 02/08/16 1634    Pertinent History refer to MD notes from [facility]   Limitations Lifting;Standing;Walking;House hold activities   Patient Stated Goals Increase LE muscle strength/ improve balance and gait.     Currently in Pain? Yes      Pt. states she is scheduled to f/u with MD tomorrow to discuss status.  Pt. states she had an adverse reaction to steroids, resulting in pain/ decrease mobility.  Pt. is currently off the IV steroid but continues to remain limited with LE muscle weakness/ fatigue.      Therapeutic Exercise:  Neuromuscular Re-ed: In // bars with CGA/SBA: ambulation without AD, no UE use 30ft x4. Amb with increased step length 15ft x4. Amb with emphasis on toe off/heel strike x4. Amb with exaggerated hip flexion to approx. 90 deg 44ft x8. Lateral steps 77ft x4 with emphasis on neutral foot.Sit to stands from green  chair 10x.  Functional reaching/ wt. Shifting/ turning tasks with min. To no UE assist (CGA for verbal cuing/ safety).    Therapeutic Exercise: Standing hip flexion 20x (maintaining  LE midline position).  Seated knee ext #4 x10 R/L. Seated hip abd. With RTB 20x. Nu Step 10 minutes Level 5 B UE/LE (no charge/cool down). Supine SLR/ bridging/ bicycles.  Supine LE generalized stretches (5 min.).  Amb. In clinic with use of rollator and instruct in upright posture/ cadence/ step pattern/ heel strike.    Pt response for medical necessity:  Limited overall muscle endurance today with persistent c/o LBP.  Decrease step pattern/ heel strike with increase distance walked and heavy use of B UE on rollator for safety.     Pt. continues to require several seated rest breaks t/o tx. session and CGA for safety with standing/ balance tasks without use of UE for support.  Persistent c/o low back pain t/o tx. but no worsening pain symptms reported.  PT did not progress ther.ex. program at this time pending MD f/u report prior to next tx. session.  Pt. remains frustrated with physical status and concerned that no MD has provided her with a diagnosis or treatment.          PT Long Term Goals - 01/25/16 1047      PT LONG TERM GOAL #1   Title Pt. I with HEP  to increase B LE muscle strength 1/2 muscle grade to improve overall muscle endurance/ mobility.     Baseline B UE/LE AROM WFL and R UE muscle strength grossly 5/5 MMT except sh. flexion 4+/5 MMT, L UE strength grossly 4/5 MMT.  B LE muscle strength grossly 4/5 MMT except knee flexion 4+/5.    Time 4   Period Weeks   Status Not Met     PT LONG TERM GOAL #2   Title Pt. will increase Berg balance test to >45/56 to improve gait with least restrictive device/ promote decrease fall risk.     Baseline Berg 43/56 on 01/24/16   Time 4   Period Weeks   Status Not Met     PT LONG TERM GOAL #3   Title Pt. will increase LEFS score to >45 out of 80 to improve  functional mobility.     Baseline LEFS: 33 out of 80 on 9/18   Time 4   Period Weeks   Status On-going     PT LONG TERM GOAL #4   Title Pt. able to ambulate 10 minutes with appropriate assistive device and more normalized gait pattern safely.     Baseline LImited standing/ walking endurance.  Benefits from rollator with gait around house and w/c in community.     Time 4   Period Weeks   Status Not Met            Plan - 02/08/16 1635    Rehab Potential Fair   PT Frequency 2x / week   PT Duration 4 weeks   PT Treatment/Interventions ADLs/Self Care Home Management;Gait training;Stair training;Functional mobility training;Therapeutic activities;Therapeutic exercise;Balance training;Patient/family education;Neuromuscular re-education;Manual techniques;Wheelchair mobility training;Passive range of motion;Energy conservation   Consulted and Agree with Plan of Care Patient      Patient will benefit from skilled therapeutic intervention in order to improve the following deficits and impairments:  Abnormal gait, Improper body mechanics, Pain, Postural dysfunction, Decreased mobility, Decreased coordination, Decreased endurance, Decreased activity tolerance, Decreased range of motion, Decreased strength, Decreased safety awareness, Decreased balance, Difficulty walking  Visit Diagnosis: Muscle weakness (generalized)  Difficulty in walking, not elsewhere classified     Problem List Patient Active Problem List   Diagnosis Date Noted  . Numbness 07/28/2015  . Bladder retention 06/23/2015  . Abdominal pain 06/04/2015  . Dizziness 05/18/2015  . Neck pain 05/18/2015  . Complicated migraine 19/24/3836  . Other fatigue 04/28/2015  . Abnormal finding on MRI of brain 04/28/2015  . D (diarrhea) 03/29/2015  . H/O disease 03/29/2015  . Abnormal weight loss 03/29/2015  . Muscle weakness (generalized) 03/14/2015  . Headache, migraine 03/10/2015   [name], PT, DPT #  [phone]  02/08/2016, 4:37 PM  [facility] [facility] [address] [place], [place], [address] Phone: [phone]   Fax:  [phone]  Name: Christine Chavez MRN: [id number] Date of Birth: 1980-07-29

## 2016-02-10 ENCOUNTER — Ambulatory Visit: Payer: 59 | Admitting: Physical Therapy

## 2016-02-10 ENCOUNTER — Encounter: Payer: Self-pay | Admitting: Physical Therapy

## 2016-02-10 DIAGNOSIS — M6281 Muscle weakness (generalized): Secondary | ICD-10-CM | POA: Diagnosis not present

## 2016-02-10 DIAGNOSIS — R262 Difficulty in walking, not elsewhere classified: Secondary | ICD-10-CM

## 2016-02-10 NOTE — Therapy (Signed)
[facility] [address] [place], [place], [address] Phone: [phone]   Fax:  [phone]  Physical Therapy Treatment  Patient Details  Name: Christine Chavez MRN: [id number] Date of Birth: 05/12/1980 Referring Provider: Adele Schilder, MD  Encounter Date: 02/10/2016      PT End of Session - 02/10/16 1527    Visit Number 7   Number of Visits 13   Date for PT Re-Evaluation 03/06/16   PT Start Time 1300   PT Stop Time 1351   PT Time Calculation (min) 51 min   Equipment Utilized During Treatment Gait belt   Activity Tolerance Patient tolerated treatment well;Patient limited by fatigue   Behavior During Therapy [facility] for tasks assessed/performed      Past Medical History:  Diagnosis Date  . Headache   . Renal disorder   . Vision abnormalities     Past Surgical History:  Procedure Laterality Date  . ANTERIOR CRUCIATE LIGAMENT REPAIR  1997  . EXPLORATORY LAPAROTOMY  1999  . TONSILLECTOMY      There were no vitals filed for this visit.      Subjective Assessment - 02/10/16 1525    Subjective Pt states that she was frustrated by lack of diagnosis at her MD appt on Tuesday. Reports that her third cousin has huntington's disease and she is wondering if she has similar pathology. Pt states she is being referred to a movement specialist for further work up. Pt states she is feeling better following the steroid dosing but its hard to tell if her mobility/weakness has changed since onset of illness due to multiple hospitalizations and medications.   Pertinent History refer to MD notes from [facility]   Limitations Lifting;Standing;Walking;House hold activities   Patient Stated Goals Increase LE muscle strength/ improve balance and gait.     Currently in Pain? No/denies     Objective:  Gait training: In // bars with emphasis on consistent heel strike and increased cadence. Pt reports feeling more  unsteady with increased speed and shows preference to increased step length to cover ground quickly. Pt with no LOB during ambulation practice; able to maintain balance independently.  Therapeutic Exercise: SciFit 10 minutes Level 6 (warm up/no charge). Standing hip flexion R/L 2x30 R/L. Tandem walk with no UE assist 34f x6. 3'' step tap alternating LE x60 forwards/backwards x60 lateral R/L with no UE assist.   Pt response for medical necessity: Pt is limited by fatigue this session, requiring multiple seated rest breaks. She has no c/o of pain during treatment session and decreased muscle fasciculation with standing tasks as compared to previous session. Demonstrates one episode of LLE trembling when arising from sit<>stand prior to exercise.        PT Long Term Goals - 02/08/16 1246      PT LONG TERM GOAL #1   Title Pt. I with HEP to increase B LE muscle strength 1/2 muscle grade to improve overall muscle endurance/ mobility.     Time 4   Period Weeks   Status On-going     PT LONG TERM GOAL #2   Title Pt. will increase Berg balance test to >45/56 to improve gait with least restrictive device/ promote decrease fall risk.     Baseline Berg 43/56 on 01/24/16   Time 4   Period Weeks   Status Not Met     PT LONG TERM GOAL #3   Title Pt. will increase LEFS score  to >45 out of 80 to improve functional mobility.     Baseline LEFS: 33 out of 80 on 9/18   Time 4   Period Weeks   Status On-going     PT LONG TERM GOAL #4   Title Pt. able to ambulate 10 minutes with appropriate assistive device and more normalized gait pattern safely.     Baseline LImited standing/ walking endurance.  Benefits from rollator with gait around house and w/c in community.     Time 4   Period Weeks               Plan - 02/10/16 1528    Clinical Impression Statement Pt with good tolerance of standing tasks this session; she experiences fatigue in her legs with ther ex but decreased muscle fasiculations  noted as compared to previous session. Pt able to maintain single leg stance with balance tasks with good control but demonstrates fair-poor balance when attempting static stance on unstable surface.   Rehab Potential Fair   PT Frequency 2x / week   PT Duration 4 weeks   PT Treatment/Interventions ADLs/Self Care Home Management;Gait training;Stair training;Functional mobility training;Therapeutic activities;Therapeutic exercise;Balance training;Patient/family education;Neuromuscular re-education;Manual techniques;Wheelchair mobility training;Passive range of motion;Energy conservation   Consulted and Agree with Plan of Care Patient      Patient will benefit from skilled therapeutic intervention in order to improve the following deficits and impairments:  Abnormal gait, Improper body mechanics, Pain, Postural dysfunction, Decreased mobility, Decreased coordination, Decreased endurance, Decreased activity tolerance, Decreased range of motion, Decreased strength, Decreased safety awareness, Decreased balance, Difficulty walking  Visit Diagnosis: Muscle weakness (generalized)  Difficulty in walking, not elsewhere classified     Problem List Patient Active Problem List   Diagnosis Date Noted  . Numbness 07/28/2015  . Bladder retention 06/23/2015  . Abdominal pain 06/04/2015  . Dizziness 05/18/2015  . Neck pain 05/18/2015  . Complicated migraine 23/30/0762  . Other fatigue 04/28/2015  . Abnormal finding on MRI of brain 04/28/2015  . D (diarrhea) 03/29/2015  . H/O disease 03/29/2015  . Abnormal weight loss 03/29/2015  . Muscle weakness (generalized) 03/14/2015  . Headache, migraine 03/10/2015   [name], PT, DPT # [id number] [name] Shirley Decamp SPT 02/10/2016, 3:51 PM  [facility] [address] [place], [place], [address] Phone: [phone]   Fax:  [phone]  Name: Zailah Zagami MRN: [id number] Date of Birth: 06-Oct-1980

## 2016-02-11 NOTE — Addendum Note (Signed)
Addended by: Dorene GrebeSHERK, Shahab Polhamus C on: 02/11/2016 12:06 PM   Modules accepted: Orders

## 2016-02-14 ENCOUNTER — Ambulatory Visit: Payer: 59 | Admitting: Physical Therapy

## 2016-02-16 ENCOUNTER — Ambulatory Visit: Payer: 59 | Attending: Psychiatry | Admitting: Physical Therapy

## 2016-02-16 ENCOUNTER — Encounter: Payer: Self-pay | Admitting: Physical Therapy

## 2016-02-16 DIAGNOSIS — M6281 Muscle weakness (generalized): Secondary | ICD-10-CM | POA: Insufficient documentation

## 2016-02-16 DIAGNOSIS — R262 Difficulty in walking, not elsewhere classified: Secondary | ICD-10-CM | POA: Diagnosis present

## 2016-02-16 NOTE — Therapy (Signed)
[place] [facility] [address] [place], [place], [address] Phone: [phone]   Fax:  [phone]  Physical Therapy Treatment  Patient Details  Name: Christine Chavez MRN: [id number] Date of Birth: 23-May-1980 Referring Provider: Adele Schilder, MD  Encounter Date: 02/16/2016      PT End of Session - 02/16/16 1628    Visit Number 8   Number of Visits 13   Date for PT Re-Evaluation 03/06/16   PT Start Time 1300   PT Stop Time 1349   PT Time Calculation (min) 49 min   Equipment Utilized During Treatment Gait belt   Activity Tolerance Patient tolerated treatment well   Behavior During Therapy [place] for tasks assessed/performed      Past Medical History:  Diagnosis Date  . Headache   . Renal disorder   . Vision abnormalities     Past Surgical History:  Procedure Laterality Date  . ANTERIOR CRUCIATE LIGAMENT REPAIR  1997  . EXPLORATORY LAPAROTOMY  1999  . TONSILLECTOMY      There were no vitals filed for this visit.      Subjective Assessment - 02/16/16 1623    Subjective Pt states that she is still waiting for referral to movement specialist; still seeking diagnosis. She reports that its hard to tell if her sx have changed much.    Pertinent History refer to MD notes from [facility]   Limitations Lifting;Standing;Walking;House hold activities   Patient Stated Goals Increase LE muscle strength/ improve balance and gait.     Currently in Pain? Yes   Pain Score 4    Pain Location Back   Pain Orientation Lower   Pain Descriptors / Indicators Constant   Pain Onset In the past 7 days  intermittently since steroid tx   Pain Frequency Intermittent     Objective:  Neuromuscular Re-ed: In // bars with SPT CGA/SBA: reciprocal marching with max hip flexion to emphasize single leg stance; pt with tendency for bilateral supination (max ankle strategies to maintain balance). Tandem stance 53f x10. Side  stepping 159fx10. Backwards stepping 1269f10. Static balance on air ex pad 1 minute trial x5. Static balance on air ex pad with lumbar rotation holding 2# weighted medicine ball 1 minute x2.   Pt response for medical necessity: Pt arrives to PT session with 4/10 low back pain that has been intermittent since steroid treatment. Standing activity/tx session does not change sx. She experiences increased muscle fasciculations as she fatigues in RLE first and then ultimately LLE. Sx resolve after seated rest break >2 minutes. Pt with 1 LOB with backwards stepping requiring SPT min A for balance correction.       PT Long Term Goals - 02/08/16 1246      PT LONG TERM GOAL #1   Title Pt. I with HEP to increase B LE muscle strength 1/2 muscle grade to improve overall muscle endurance/ mobility.     Time 4   Period Weeks   Status On-going     PT LONG TERM GOAL #2   Title Pt. will increase Berg balance test to >45/56 to improve gait with least restrictive device/ promote decrease fall risk.     Baseline Berg 43/56 on 01/24/16   Time 4   Period Weeks   Status Not Met     PT LONG TERM GOAL #3   Title Pt. will increase LEFS score to >45 out of 80 to improve functional mobility.  Baseline LEFS: 33 out of 80 on 9/18   Time 4   Period Weeks   Status On-going     PT LONG TERM GOAL #4   Title Pt. able to ambulate 10 minutes with appropriate assistive device and more normalized gait pattern safely.     Baseline LImited standing/ walking endurance.  Benefits from rollator with gait around house and w/c in community.     Time 4   Period Weeks               Plan - 02/16/16 1629    Clinical Impression Statement Pt demonstrates L/R trunk lean during reciprocal single leg stance tasks; initially with increased L lean and then with bilateral trunk lean. Pt performs approx 20 minutes of cont. standing exercise without rest break transitioning to static balance activity where she experiences  non-stop muscle fasiculations in RLE. Pt able to perform balance activity but with RLE and ultimately LLE fasiculations; resolved by seated rest.    Rehab Potential Fair   PT Frequency 2x / week   PT Duration 4 weeks   PT Treatment/Interventions ADLs/Self Care Home Management;Gait training;Stair training;Functional mobility training;Therapeutic activities;Therapeutic exercise;Balance training;Patient/family education;Neuromuscular re-education;Manual techniques;Wheelchair mobility training;Passive range of motion;Energy conservation   PT Next Visit Plan Progress HEP/ proprioceptive and balance ex.     Consulted and Agree with Plan of Care Patient      Patient will benefit from skilled therapeutic intervention in order to improve the following deficits and impairments:  Abnormal gait, Improper body mechanics, Pain, Postural dysfunction, Decreased mobility, Decreased coordination, Decreased endurance, Decreased activity tolerance, Decreased range of motion, Decreased strength, Decreased safety awareness, Decreased balance, Difficulty walking  Visit Diagnosis: Muscle weakness (generalized)  Difficulty in walking, not elsewhere classified     Problem List Patient Active Problem List   Diagnosis Date Noted  . Numbness 07/28/2015  . Bladder retention 06/23/2015  . Abdominal pain 06/04/2015  . Dizziness 05/18/2015  . Neck pain 05/18/2015  . Complicated migraine 24/12/7351  . Other fatigue 04/28/2015  . Abnormal finding on MRI of brain 04/28/2015  . D (diarrhea) 03/29/2015  . H/O disease 03/29/2015  . Abnormal weight loss 03/29/2015  . Muscle weakness (generalized) 03/14/2015  . Headache, migraine 03/10/2015   [name], PT, DPT # [id number] [name]  SPT 02/16/2016, 4:38 PM  [name] [address] [place], [place], [address] Phone: [phone]   Fax:  [phone]  Name: Christine Chavez MRN: [id number] Date of Birth:  01/01/1981

## 2016-02-21 ENCOUNTER — Ambulatory Visit: Payer: 59 | Admitting: Physical Therapy

## 2016-02-21 DIAGNOSIS — R262 Difficulty in walking, not elsewhere classified: Secondary | ICD-10-CM

## 2016-02-21 DIAGNOSIS — M6281 Muscle weakness (generalized): Secondary | ICD-10-CM | POA: Diagnosis not present

## 2016-02-22 ENCOUNTER — Encounter: Payer: Self-pay | Admitting: Physical Therapy

## 2016-02-22 NOTE — Therapy (Signed)
[place] [facility] [address] [place], [place], [address] Phone: [phone]   Fax:  [phone]  Physical Therapy Treatment  Patient Details  Name: Mazie Fencl MRN: [id number] Date of Birth: 1980/12/19 Referring Provider: Adele Schilder, MD  Encounter Date: 02/21/2016      PT End of Session - 02/22/16 1550    Visit Number 9   Number of Visits 13   Date for PT Re-Evaluation 03/06/16   PT Start Time 1311   PT Stop Time 1406   PT Time Calculation (min) 55 min   Equipment Utilized During Treatment Gait belt   Activity Tolerance Patient tolerated treatment well;No increased pain   Behavior During Therapy [facility] for tasks assessed/performed      Past Medical History:  Diagnosis Date  . Headache   . Renal disorder   . Vision abnormalities     Past Surgical History:  Procedure Laterality Date  . ANTERIOR CRUCIATE LIGAMENT REPAIR  1997  . EXPLORATORY LAPAROTOMY  1999  . TONSILLECTOMY      There were no vitals filed for this visit.      Subjective Assessment - 02/22/16 1548    Subjective Pt. states she is scheduled to see movement specialist in 5 months.  Pt. hoping to get in sooner and get a better understanding of issues/treatment.  Pt. planning on contacting MD this afternoon to discuss options.  Pt. reports increase muscle shaking yesterday at church requiring max. assist from husband/ friends to get up and down steps for choir.     Pertinent History refer to MD notes from [facility]   Limitations Lifting;Standing;Walking;House hold activities   Patient Stated Goals Increase LE muscle strength/ improve balance and gait.     Currently in Pain? No/denies      Objective:  Scifit L5 10 min. B UE/LE (consistent cadence/ no charge).   Neuromuscular Re-ed: In // bars with SBA/CGA: reciprocal marching with max hip flexion to emphasize single leg stance; Recip. Step touches with heel/toe tap focus  and min. UE assist.  Tandem stance 35f x10. Side stepping 133fx10. Backwards stepping 1249f10.  Resisted gait 1BTB 5x all 4-planes with light UE assist as tolerated (cuing and SBA for safety).  There.ex.: supine SLR/ ball knee to chest/ ball bridging/ bicycle kicks 10x each.  Gait:  Ascend/descend 4 steps with step to pattern progressing to recip. Pattern with L handrail and SBA/CGA for safety.  Limited muscle fasciculations in lower leg today.     Pt response for medical necessity:  Pt. Completed entire tx. With minimal rest breaks.  Lower leg muscle fasciculations noted but not extensive with standing ex./ step ups as compared to previous tx.  Pt. Benefits from skilled PT to focus on safety/ progressive there.ex. To improve independence.        PT Long Term Goals - 02/08/16 1246      PT LONG TERM GOAL #1   Title Pt. I with HEP to increase B LE muscle strength 1/2 muscle grade to improve overall muscle endurance/ mobility.     Time 4   Period Weeks   Status On-going     PT LONG TERM GOAL #2   Title Pt. will increase Berg balance test to >45/56 to improve gait with least restrictive device/ promote decrease fall risk.     Baseline Berg 43/56 on 01/24/16   Time 4   Period Weeks   Status Not Met  PT LONG TERM GOAL #3   Title Pt. will increase LEFS score to >45 out of 80 to improve functional mobility.     Baseline LEFS: 33 out of 80 on 9/18   Time 4   Period Weeks   Status On-going     PT LONG TERM GOAL #4   Title Pt. able to ambulate 10 minutes with appropriate assistive device and more normalized gait pattern safely.     Baseline LImited standing/ walking endurance.  Benefits from rollator with gait around house and w/c in community.     Time 4   Period Weeks             Plan - 02/22/16 1551    Clinical Impression Statement Overall LE muscle fatigue noted as tx. progressed but less muscle fasciculations noted in legs with standing ther.ex./ step ups.  Pt. able to  ascend/descend 4 steps with step to pattern and B UE assist with SBA and proper technique safely.  Pt. uncertain with recip. step pattern at this time but good quad muscle control.  Pt. ambulates with continued difficulty with initiating step pattern/ limited hip flexion and step through gait with use of rollator for safety.  No LOB during tx. session.  Pt. discussed the possibility of Myastenia Gravis diagnosis with PT.  PT did not notice any ptosis of eyelids at time of tx.     Rehab Potential Fair   PT Frequency 2x / week   PT Duration 4 weeks   PT Treatment/Interventions ADLs/Self Care Home Management;Gait training;Stair training;Functional mobility training;Therapeutic activities;Therapeutic exercise;Balance training;Patient/family education;Neuromuscular re-education;Manual techniques;Wheelchair mobility training;Passive range of motion;Energy conservation   PT Next Visit Plan Progress HEP/ proprioceptive and balance ex.     PT Home Exercise Plan See handouts   Consulted and Agree with Plan of Care Patient      Patient will benefit from skilled therapeutic intervention in order to improve the following deficits and impairments:  Abnormal gait, Improper body mechanics, Pain, Postural dysfunction, Decreased mobility, Decreased coordination, Decreased endurance, Decreased activity tolerance, Decreased range of motion, Decreased strength, Decreased safety awareness, Decreased balance, Difficulty walking  Visit Diagnosis: Muscle weakness (generalized)  Difficulty in walking, not elsewhere classified     Problem List Patient Active Problem List   Diagnosis Date Noted  . Numbness 07/28/2015  . Bladder retention 06/23/2015  . Abdominal pain 06/04/2015  . Dizziness 05/18/2015  . Neck pain 05/18/2015  . Complicated migraine 56/31/4970  . Other fatigue 04/28/2015  . Abnormal finding on MRI of brain 04/28/2015  . D (diarrhea) 03/29/2015  . H/O disease 03/29/2015  . Abnormal weight loss  03/29/2015  . Muscle weakness (generalized) 03/14/2015  . Headache, migraine 03/10/2015   [name], PT, DPT # [id number] 02/22/2016, 5:24 PM  [place] [facility] [address] [place], [place], [address] Phone: [phone]   Fax:  [phone]  Name: Drea Jurewicz MRN: [id number] Date of Birth: [birth year]

## 2016-02-28 ENCOUNTER — Ambulatory Visit: Payer: 59 | Admitting: Physical Therapy

## 2016-03-02 ENCOUNTER — Ambulatory Visit: Payer: 59 | Admitting: Physical Therapy

## 2016-03-02 ENCOUNTER — Encounter: Payer: Self-pay | Admitting: Physical Therapy

## 2016-03-02 DIAGNOSIS — M6281 Muscle weakness (generalized): Secondary | ICD-10-CM

## 2016-03-02 DIAGNOSIS — R262 Difficulty in walking, not elsewhere classified: Secondary | ICD-10-CM

## 2016-03-03 NOTE — Therapy (Addendum)
[facility] [address] [place], [place], [address] Phone: [phone]   Fax:  [phone]  Physical Therapy Treatment  Patient Details  Name: Christine Chavez MRN: [id number] Date of Birth: 1980-09-15 Referring Provider: Adele Schilder, MD  Encounter Date: 03/02/2016        PT End of Session - 03/03/16 1529    Visit Number 10   Number of Visits 13   Date for PT Re-Evaluation 03/06/16   PT Start Time 1107   PT Stop Time 1203   PT Time Calculation (min) 56 min   Equipment Utilized During Treatment Gait belt   Activity Tolerance Patient tolerated treatment well;No increased pain   Behavior During Therapy WFL for tasks assessed/performed      Past Medical History:  Diagnosis Date  . Headache   . Renal disorder   . Vision abnormalities     Past Surgical History:  Procedure Laterality Date  . ANTERIOR CRUCIATE LIGAMENT REPAIR  1997  . EXPLORATORY LAPAROTOMY  1999  . TONSILLECTOMY      There were no vitals filed for this visit.    Pt. reports no new complaints.  Pt. remains frustrated with lack of answers from MDs.  Pt. states she hasn't had an increase in muscle shaking since last tx. session.     Objective:  Scifit L5 10 min. B UE/LE (consistent cadence/ no charge).   Neuromuscular Re-ed: In // bars with SBA/CGA: reciprocal marching with max hip flexion to emphasize single leg stance; Recip. Step touches with heel/toe tap focus and min. UE assist.  Tandem stance 67f x10. Side stepping [id number]. Backwards stepping 1261f10.  Resisted gait 1BTB 5x all 4-planes with light UE assist as tolerated (cuing and SBA for safety).  There.ex.: supine SLR/ ball knee to chest/ ball bridging/ bicycle kicks 10x each. Reviewed HEP in depth.  Gait:  Ascend/descend 4 steps with step to pattern progressing to recip. Pattern with L handrail and SBA/CGA for safety.  Limited muscle fasciculations in lower leg today.     Pt  response for medical necessity:  Pt. Completed entire tx. With minimal rest breaks.  Lower leg muscle fasciculations noted but not extensive with standing ex./ step ups as compared to previous tx.  Pt. Benefits from skilled PT to focus on safety/ progressive there.ex. To improve independence.      PT will call pt. after Thanksgiving vacation to check status.  Pt. will continue with HEP/ strengthening ex. program/ walking with use of rollator for safety.  Several episodes of LE muscle fasciculations with increase distance walked.  Pt. benefits from verbal cuing to increase hip/knee flexion with step pattern and maintain upright posture with use of rollator.  No increase c/o pain reported during tx. session.          PT Long Term Goals - 03/03/16 1300      PT LONG TERM GOAL #1   Title Pt. I with HEP to increase B LE muscle strength 1/2 muscle grade to improve overall muscle endurance/ mobility.     Baseline B UE/LE AROM WFL and R UE muscle strength grossly 5/5 MMT except sh. flexion 4+/5 MMT, L UE strength grossly 4/5 MMT.  B LE muscle strength grossly 4/5 MMT except knee flexion 4+/5.    Time 4   Period Weeks   Status Not Met     PT LONG TERM GOAL #2   Title Pt. will increase Berg balance test to >  45/56 to improve gait with least restrictive device/ promote decrease fall risk.     Baseline Berg 43/56 on 01/24/16   Time 4   Period Weeks   Status Not Met     PT LONG TERM GOAL #3   Title Pt. will increase LEFS score to >45 out of 80 to improve functional mobility.     Baseline LEFS: 33 out of 80 on 9/18   Time 4   Period Weeks   Status On-going     PT LONG TERM GOAL #4   Title Pt. able to ambulate 10 minutes with appropriate assistive device and more normalized gait pattern safely.     Baseline LImited standing/ walking endurance.  Benefits from rollator with gait around house and w/c in community.     Time 4   Period Weeks   Status Not Met       Patient will benefit from  skilled therapeutic intervention in order to improve the following deficits and impairments:  Abnormal gait, Improper body mechanics, Pain, Postural dysfunction, Decreased mobility, Decreased coordination, Decreased endurance, Decreased activity tolerance, Decreased range of motion, Decreased strength, Decreased safety awareness, Decreased balance, Difficulty walking  Visit Diagnosis: Muscle weakness (generalized)  Difficulty in walking, not elsewhere classified     Problem List Patient Active Problem List   Diagnosis Date Noted  . Numbness 07/28/2015  . Bladder retention 06/23/2015  . Abdominal pain 06/04/2015  . Dizziness 05/18/2015  . Neck pain 05/18/2015  . Complicated migraine 03/02/5207  . Other fatigue 04/28/2015  . Abnormal finding on MRI of brain 04/28/2015  . D (diarrhea) 03/29/2015  . H/O disease 03/29/2015  . Abnormal weight loss 03/29/2015  . Muscle weakness (generalized) 03/14/2015  . Headache, migraine 03/10/2015   [name], PT, DPT # [phone] 03/12/2016, 1:02 PM  [facility] [address] [place], [place], [address] Phone: [phone]   Fax:  [phone]  Name: Shaquisha Wynn MRN: [id number] Date of Birth: [birth year]

## 2016-03-16 ENCOUNTER — Ambulatory Visit: Payer: 59 | Admitting: Physical Therapy

## 2016-03-24 ENCOUNTER — Encounter: Payer: Self-pay | Admitting: Urology

## 2016-03-24 ENCOUNTER — Ambulatory Visit (INDEPENDENT_AMBULATORY_CARE_PROVIDER_SITE_OTHER): Payer: 59 | Admitting: Urology

## 2016-03-24 VITALS — BP 113/80 | HR 72 | Ht 65.0 in | Wt 151.0 lb

## 2016-03-24 DIAGNOSIS — N319 Neuromuscular dysfunction of bladder, unspecified: Secondary | ICD-10-CM | POA: Diagnosis not present

## 2016-03-24 DIAGNOSIS — R300 Dysuria: Secondary | ICD-10-CM | POA: Diagnosis not present

## 2016-03-24 LAB — URINALYSIS, COMPLETE
Bilirubin, UA: NEGATIVE
Glucose, UA: NEGATIVE
Ketones, UA: NEGATIVE
Leukocytes, UA: NEGATIVE
Nitrite, UA: NEGATIVE
Protein, UA: NEGATIVE
Specific Gravity, UA: 1.025 (ref 1.005–1.030)
Urobilinogen, Ur: 0.2 mg/dL (ref 0.2–1.0)
pH, UA: 5.5 (ref 5.0–7.5)

## 2016-03-24 LAB — MICROSCOPIC EXAMINATION: Epithelial Cells (non renal): >10 /hpf — AB (ref 0–10)

## 2016-03-24 MED ORDER — CIPROFLOXACIN HCL 500 MG PO TABS
500.0000 mg | ORAL_TABLET | Freq: Two times a day (BID) | ORAL | 0 refills | Status: DC
Start: 2016-03-24 — End: 2016-05-17

## 2016-03-24 NOTE — Progress Notes (Signed)
03/24/2016 3:13 PM   Christine Chavez [birth year] [id number]  Referring provider: Maisie Fusatherine Louw Coe, MD No address on file  Chief Complaint  Patient presents with  . Follow-up    neurogenic bladder     HPI: Christine Chavez Patient is a 35 year old female with a recent history of new onset neurologic symptoms including severe headaches, upper and lower extremity weakness and most recently the urinary retention. She reports that [facility]. Her neurologic workup has been negative thus far. Testing for multiple sclerosis has been negative. She was experiencing difficulty voiding prior to discharge which was attributed to her change in multiple medications. She continued to experience difficulty urinating and had a friend who is a nurse come over and perform a urinary catheterization which resulted in approximately a liter urine output. She then was seen in the emergency department and a Foley catheter was placed approximately 1 week ago. She reports that she has not taken any medications over the last week. She presents today for voiding trial. She continues to experience weakness and severe headaches.   During the patient's last visit it had been noted she had had a positive culture and a small nonobstructing stones bilaterally with no hydronephrosis. She had a trial of voiding with a postvoid residual of 250 mL. A self-catheterization protocol was set up.  The patient is here to discuss urodynamics. She was catheterized for 50 mL. Her maximum bladder capacity was 900 mL. Her bladder was hypo-sensitive. Her bladder was stable. She did not leak with Valsalva pressure 58 cm's water. She had a lot of difficulty voiding in the urodynamics lab. She could only generate a voluntary contraction a 6 cm of water that was not well sustained. Bladder neck descended 1 cm. Her first sensation of bladder fullness was 543 mL. The details of the urodynamics are  sided dictated on the urodynamics sheet.  The patient's flow symptoms and retention are due to a large capacity hyposensitive bladder. This may or may not be an acute on chronic problem versus an acute 1. Neurology is following her expectantly. She will continue to self catheterize and check residuals when needed. She'll continue to manage her bladder as she is now. The findings on urodynamics and her clinical presentation is out of the no change in bladder function. ordinary and likely she has a neurogenic bladder. The role of an alpha-blocker discussed.  Today no change in bladder function. Catheterizes 3-4 times a week but I believe some of the volumes if they are post void residuals are probed 4 ounces. She will see a new neurologist about this in August and she had left arm and face symptoms recently.  Today The patient had a steroid treatment several months ago and now only catheterizes once a day. This is an improvement. If she does check her residual she still thinks it's about 4 ounces. In the last week she's had some right lower abdominal discomfort and right-sided pain. She is not febrile.  She may have had a urinary tract infection 6 months ago.  Her neurologist thinks she may have multiple sclerosis but is not certain.  Modifying factors: There are no other modifying factors  Associated signs and symptoms: There are no other associated signs and symptoms Aggravating and relieving factors: There are no other aggravating or relieving factors Severity: Moderate Duration: Persistent   PMH: Past Medical History:  Diagnosis Date  . Headache   . Renal disorder   .  Vision abnormalities     Surgical History: Past Surgical History:  Procedure Laterality Date  . ANTERIOR CRUCIATE LIGAMENT REPAIR  1997  . EXPLORATORY LAPAROTOMY  1999  . TONSILLECTOMY      Home Medications:    Medication List       Accurate as of 03/24/16  3:13 PM. Always use your most recent med list.            levETIRAcetam 750 MG tablet Commonly known as:  KEPPRA Take 1 tablet (750 mg total) by mouth 2 (two) times daily.   medroxyPROGESTERone 10 MG tablet Commonly known as:  PROVERA Take 10 mg by mouth.   mirtazapine 15 MG tablet Commonly known as:  REMERON TAKE 1 TABLET (15 MG TOTAL) BY MOUTH NIGHTLY.   ondansetron 4 MG disintegrating tablet Commonly known as:  ZOFRAN-ODT Take 4 mg by mouth.   ondansetron 4 MG tablet Commonly known as:  ZOFRAN Take 4 mg by mouth every 8 (eight) hours as needed for nausea or vomiting. Reported on 07/21/2015   promethazine 12.5 MG tablet Commonly known as:  PHENERGAN Take 12.5 mg by mouth every 12 (twelve) hours as needed for nausea or vomiting.   rizatriptan 5 MG tablet Commonly known as:  MAXALT Take 5 mg by mouth.   rizatriptan 5 MG disintegrating tablet Commonly known as:  MAXALT-MLT Take 1 tablet (5 mg total) by mouth as needed for migraine. May repeat in 2 hours if needed   sertraline 25 MG tablet Commonly known as:  ZOLOFT Take 25 mg by mouth daily.   sertraline 100 MG tablet Commonly known as:  ZOLOFT Take 150 mg by mouth.   tamsulosin 0.4 MG Caps capsule Commonly known as:  FLOMAX Take 1 capsule (0.4 mg total) by mouth daily.   traMADol 50 MG tablet Commonly known as:  ULTRAM Take 50 mg by mouth.       Allergies:  Allergies  Allergen Reactions  . Aspirin Shortness Of Breath, Swelling and Anaphylaxis  . Ibuprofen Shortness Of Breath, Swelling and Anaphylaxis    Family History: Family History  Problem Relation Age of Onset  . Hypertension Mother   . Atrial fibrillation Father   . Healthy Brother   . Arthritis/Rheumatoid Paternal Grandmother   . Healthy Brother     Social History:  reports that she has never smoked. She does not have any smokeless tobacco history on file. She reports that she does not drink alcohol or use drugs.  ROS:                                         Physical Exam:   Laboratory Data: Lab Results  Component Value Date   WBC 8.4 09/01/2015   HGB 13.6 09/01/2015   HCT 40.3 09/01/2015   MCV 93.3 09/01/2015   PLT 240 09/01/2015    Lab Results  Component Value Date   CREATININE 0.64 09/01/2015    No results found for: PSA  No results found for: TESTOSTERONE  No results found for: HGBA1C  Urinalysis    Component Value Date/Time   COLORURINE STRAW (A) 09/01/2015 1344   APPEARANCEUR Clear 12/03/2015 1529   LABSPEC 1.020 09/01/2015 1344   PHURINE 5.5 09/01/2015 1344   GLUCOSEU Negative 12/03/2015 1529   HGBUR 1+ (A) 09/01/2015 1344   BILIRUBINUR Negative 12/03/2015 1529   KETONESUR NEGATIVE 09/01/2015 1344   PROTEINUR 1+ (A)  12/03/2015 1529   PROTEINUR NEGATIVE 09/01/2015 1344   NITRITE Negative 12/03/2015 1529   NITRITE NEGATIVE 09/01/2015 1344   LEUKOCYTESUR Negative 12/03/2015 1529    Pertinent Imaging: none  Assessment & Plan:  The patient may have a urinary tract infection. Her last 2 urine cultures here were negative. It is been approximately one year since the neurologic issue began. She has been catheterizing since February of this year. Her CT scan in May 2017 demonstrated nonobstructing small stones bilaterally  The patient was given a choice and I gave her ciprofloxacin 250 mg twice a day for 1 week. I will call the report differs. I will see in 6 months with a baseline ultrasound. She catheterize her self today for us.    There are no diagnoses linked to this encounter.  No Follow-up on file.  Martina SinnerMACDIARMID,Cody Oliger A, MD  [facility] [address], [address] [place], [place] [address] [phone]

## 2016-03-28 ENCOUNTER — Telehealth: Payer: Self-pay

## 2016-03-28 NOTE — Telephone Encounter (Signed)
Tedd SiasKelita spoke with pt and made aware ucx was negative.

## 2016-03-31 LAB — CULTURE, URINE COMPREHENSIVE

## 2016-04-12 ENCOUNTER — Encounter: Payer: Self-pay | Admitting: Emergency Medicine

## 2016-04-12 ENCOUNTER — Emergency Department
Admission: EM | Admit: 2016-04-12 | Discharge: 2016-04-12 | Disposition: A | Discharge status: Home / Self Care | Payer: 59 | Attending: Emergency Medicine | Admitting: Emergency Medicine

## 2016-04-12 ENCOUNTER — Telehealth: Payer: Self-pay

## 2016-04-12 ENCOUNTER — Emergency Department: Payer: 59

## 2016-04-12 DIAGNOSIS — Z79899 Other long term (current) drug therapy: Secondary | ICD-10-CM | POA: Diagnosis not present

## 2016-04-12 DIAGNOSIS — N201 Calculus of ureter: Secondary | ICD-10-CM | POA: Diagnosis not present

## 2016-04-12 DIAGNOSIS — N200 Calculus of kidney: Secondary | ICD-10-CM | POA: Insufficient documentation

## 2016-04-12 DIAGNOSIS — R109 Unspecified abdominal pain: Secondary | ICD-10-CM | POA: Diagnosis present

## 2016-04-12 LAB — CBC
HCT: 39.1 % (ref 35.0–47.0)
Hemoglobin: 13.6 g/dL (ref 12.0–16.0)
MCH: 32 pg (ref 26.0–34.0)
MCHC: 34.7 g/dL (ref 32.0–36.0)
MCV: 92.1 fL (ref 80.0–100.0)
Platelets: 318 10*3/uL (ref 150–440)
RBC: 4.25 MIL/uL (ref 3.80–5.20)
RDW: 13.6 % (ref 11.5–14.5)
WBC: 11.4 10*3/uL — ABNORMAL HIGH (ref 3.6–11.0)

## 2016-04-12 LAB — URINALYSIS, COMPLETE (UACMP) WITH MICROSCOPIC
Bacteria, UA: NONE SEEN
Bilirubin Urine: NEGATIVE
Glucose, UA: NEGATIVE mg/dL
Ketones, ur: 5 mg/dL — AB
Leukocytes, UA: NEGATIVE
Nitrite: NEGATIVE
Protein, ur: 30 mg/dL — AB
Specific Gravity, Urine: 1.017 (ref 1.005–1.030)
pH: 8 (ref 5.0–8.0)

## 2016-04-12 LAB — BASIC METABOLIC PANEL
Anion gap: 9 (ref 5–15)
BUN: 9 mg/dL (ref 6–20)
CO2: 24 mmol/L (ref 22–32)
Calcium: 9.1 mg/dL (ref 8.9–10.3)
Chloride: 106 mmol/L (ref 101–111)
Creatinine, Ser: 0.79 mg/dL (ref 0.44–1.00)
GFR calc Af Amer: >60 mL/min (ref >60)
GFR calc non Af Amer: >60 mL/min (ref >60)
Glucose, Bld: 120 mg/dL — ABNORMAL HIGH (ref 65–99)
Potassium: 3.1 mmol/L — ABNORMAL LOW (ref 3.5–5.1)
Sodium: 139 mmol/L (ref 135–145)

## 2016-04-12 LAB — PREGNANCY, URINE: Preg Test, Ur: NEGATIVE

## 2016-04-12 MED ORDER — ONDANSETRON HCL 4 MG/2ML IJ SOLN
4.0000 mg | Freq: Once | INTRAMUSCULAR | Status: AC
  Administered 2016-04-12: 4 mg via INTRAVENOUS

## 2016-04-12 MED ORDER — ONDANSETRON 4 MG PO TBDP: 4.0000 mg | ORAL_TABLET | Freq: Three times a day (TID) | ORAL | 0 refills | Status: DC | PRN

## 2016-04-12 MED ORDER — FENTANYL CITRATE (PF) 100 MCG/2ML IJ SOLN
50.0000 ug | INTRAMUSCULAR | Status: AC | PRN
  Administered 2016-04-12 (×2): 50 ug via INTRAVENOUS
  Filled 2016-04-12: qty 2

## 2016-04-12 MED ORDER — HYDROMORPHONE HCL 1 MG/ML IJ SOLN
INTRAMUSCULAR | Status: AC
  Administered 2016-04-12: 1 mg via INTRAVENOUS
  Filled 2016-04-12: qty 1

## 2016-04-12 MED ORDER — ONDANSETRON HCL 4 MG/2ML IJ SOLN
INTRAMUSCULAR | Status: AC
  Filled 2016-04-12: qty 2

## 2016-04-12 MED ORDER — TAMSULOSIN HCL 0.4 MG PO CAPS: 0.4000 mg | ORAL_CAPSULE | Freq: Every day | ORAL | 0 refills | Status: DC

## 2016-04-12 MED ORDER — FENTANYL CITRATE (PF) 100 MCG/2ML IJ SOLN
INTRAMUSCULAR | Status: AC
  Administered 2016-04-12: 50 ug via INTRAVENOUS
  Filled 2016-04-12: qty 2

## 2016-04-12 MED ORDER — OXYCODONE-ACETAMINOPHEN 5-325 MG PO TABS: 1.0000 | ORAL_TABLET | Freq: Four times a day (QID) | ORAL | 0 refills | Status: DC | PRN

## 2016-04-12 MED ORDER — HYDROMORPHONE HCL 1 MG/ML IJ SOLN
1.0000 mg | Freq: Once | INTRAMUSCULAR | Status: AC
  Administered 2016-04-12: 1 mg via INTRAVENOUS

## 2016-04-12 NOTE — Telephone Encounter (Signed)
Pt called stating that she is having severe bladder spasms with CIC. Pt was wondering if she could get any medication to help with them. Please advise.

## 2016-04-12 NOTE — ED Provider Notes (Signed)
[facility] Emergency Department Provider Note  Time seen: 4:30 PM  I have reviewed the triage vital signs and the nursing notes.   HISTORY  Chief Complaint Flank Pain    HPI Christine Chavez is a 35 y.o. female with a past medical history of a neurologic disorder thought to be related to multiple sclerosis although no definitive diagnosis has been established at this time, patient is currently pending [facility] neurology evaluation. Patient states she does use an in and out catheter once daily to help urinate. Patient states this morning she developed severe sharp right flank pain. She has also noticed blood in her urine today. Of note she states yesterday she had a low-grade fever of 100, denies any dysuria. States a mild cough. Describes her right flank pain as sharp and severe.  Past Medical History:  Diagnosis Date  . Headache   . Renal disorder   . Vision abnormalities     Patient Active Problem List   Diagnosis Date Noted  . Numbness 07/28/2015  . Bladder retention 06/23/2015  . Abdominal pain 06/04/2015  . Dizziness 05/18/2015  . Neck pain 05/18/2015  . Complicated migraine 04/28/2015  . Other fatigue 04/28/2015  . Abnormal finding on MRI of brain 04/28/2015  . D (diarrhea) 03/29/2015  . H/O disease 03/29/2015  . Abnormal weight loss 03/29/2015  . Muscle weakness (generalized) 03/14/2015  . Headache, migraine 03/10/2015    Past Surgical History:  Procedure Laterality Date  . ANTERIOR CRUCIATE LIGAMENT REPAIR  1997  . EXPLORATORY LAPAROTOMY  1999  . TONSILLECTOMY      Prior to Admission medications   Medication Sig Start Date End Date Taking? Authorizing Provider  ciprofloxacin (CIPRO) 500 MG tablet Take 1 tablet (500 mg total) by mouth every 12 (twelve) hours. 03/24/16   Alfredo MartinezScott MacDiarmid, MD  levETIRAcetam (KEPPRA) 750 MG tablet Take 1 tablet (750 mg total) by mouth 2 (two) times daily. Patient not taking: Reported on 03/24/2016 10/29/15    Asa Lenteichard A Sater, MD  medroxyPROGESTERone (PROVERA) 10 MG tablet Take 10 mg by mouth.    Historical Provider, MD  mirtazapine (REMERON) 15 MG tablet TAKE 1 TABLET (15 MG TOTAL) BY MOUTH NIGHTLY. 06/11/15   Historical Provider, MD  ondansetron (ZOFRAN) 4 MG tablet Take 4 mg by mouth every 8 (eight) hours as needed for nausea or vomiting. Reported on 07/21/2015    Historical Provider, MD  ondansetron (ZOFRAN-ODT) 4 MG disintegrating tablet Take 4 mg by mouth.    Historical Provider, MD  promethazine (PHENERGAN) 12.5 MG tablet Take 12.5 mg by mouth every 12 (twelve) hours as needed for nausea or vomiting.  06/11/15 06/16/15  Historical Provider, MD  rizatriptan (MAXALT) 5 MG tablet Take 5 mg by mouth.    Historical Provider, MD  rizatriptan (MAXALT-MLT) 5 MG disintegrating tablet Take 1 tablet (5 mg total) by mouth as needed for migraine. May repeat in 2 hours if needed Patient not taking: Reported on 03/24/2016 04/28/15   Asa Lenteichard A Sater, MD  sertraline (ZOLOFT) 100 MG tablet Take 150 mg by mouth. 11/02/15   Historical Provider, MD  sertraline (ZOLOFT) 25 MG tablet Take 25 mg by mouth daily. 06/01/15 05/31/16  Historical Provider, MD  tamsulosin (FLOMAX) 0.4 MG CAPS capsule Take 1 capsule (0.4 mg total) by mouth daily. Patient not taking: Reported on 03/24/2016 07/21/15   Alfredo MartinezScott MacDiarmid, MD  traMADol (ULTRAM) 50 MG tablet Take 50 mg by mouth. 02/04/16   Historical Provider, MD    Allergies  Allergen Reactions  . Aspirin Shortness Of Breath, Swelling and Anaphylaxis  . Ibuprofen Shortness Of Breath, Swelling and Anaphylaxis    Family History  Problem Relation Age of Onset  . Hypertension Mother   . Atrial fibrillation Father   . Healthy Brother   . Arthritis/Rheumatoid Paternal Grandmother   . Healthy Brother     Social History Social History  Substance Use Topics  . Smoking status: Never Smoker  . Smokeless tobacco: Not on file  . Alcohol use No    Review of Systems Constitutional: Fever to  100.0 Yesterday Cardiovascular: Negative for chest pain. Respiratory: Negative for shortness of breath. Gastrointestinal: Positive for right flank pain. Positive for nausea or vomiting. Negative for diarrhea. Genitourinary: Negative for dysuria. Possible hematuria. Last period was approximately one week ago. Neurological: Negative for headache 10-point ROS otherwise negative.  ____________________________________________   PHYSICAL EXAM:  VITAL SIGNS: ED Triage Vitals [04/12/16 1433]  Enc Vitals Group     BP (!) 139/97     Pulse Rate 86     Resp 18     Temp 98.3 F (36.8 C)     Temp Source Oral     SpO2 100 %     Weight 140 lb (63.5 kg)     Height 5\' 5"  (1.651 m)     Head Circumference      Peak Flow      Pain Score 10     Pain Loc      Pain Edu?      Excl. in GC?     Constitutional: Alert and oriented. Well appearing and in no distress. Eyes: Normal exam ENT   Head: Normocephalic and atraumatic.   Mouth/Throat: Mucous membranes are moist. Cardiovascular: Normal rate, regular rhythm. No murmur Respiratory: Normal respiratory effort without tachypnea nor retractions. Breath sounds are clear  Gastrointestinal: Soft and nontender. No distention.  Musculoskeletal: Nontender with normal range of motion in all extremities.  Neurologic:  Normal speech and language. No gross focal neurologic deficits  Skin:  Skin is warm, dry and intact.  Psychiatric: Mood and affect are normal.  ____________________________________________    INITIAL IMPRESSION / ASSESSMENT AND PLAN / ED COURSE  Pertinent labs & imaging results that were available during my care of the patient were reviewed by me and considered in my medical decision making (see chart for details).  The patient presents the emergency department with right flank pain beginning this morning, slight fever yesterday per patient. Labs show hematuria, with a mild leukocytosis otherwise within normal limits. Patient  states a history of a kidney stone 9 years ago. We will obtain a CT renal scan to further evaluate.   CT consistent with 6 mm right ureteral stone, otherwise within normal limits. Patient will be discharged home with Flomax and Percocet. Patient states her pain is considerably better. ____________________________________________   FINAL CLINICAL IMPRESSION(S) / ED DIAGNOSES  Right flank pain Ureterolithiasis   Minna AntisKevin Mattheus Rauls, MD 04/12/16 1900

## 2016-04-12 NOTE — ED Notes (Signed)
Spoke with lab about testing the urine preg with urine they already had. Lab stated they will be able to.

## 2016-04-12 NOTE — ED Triage Notes (Addendum)
C/o right flank pain that wraps around to RLQ. Pain started today. Feels similar to when had kidney stone last. Also c/o bladder spasms this AM but urologist was not in office.  Pt has to intermittent cath but unsure why per pt; unsure if MS. Reports temp 100.3. Pale in triage. Has been vomiting.

## 2016-04-12 NOTE — ED Notes (Signed)
Patient transported to CT 

## 2016-04-14 ENCOUNTER — Ambulatory Visit (INDEPENDENT_AMBULATORY_CARE_PROVIDER_SITE_OTHER): Payer: 59 | Admitting: Urology

## 2016-04-14 ENCOUNTER — Encounter: Payer: Self-pay | Admitting: Urology

## 2016-04-14 VITALS — BP 118/80 | HR 91 | Ht 65.0 in | Wt 146.8 lb

## 2016-04-14 DIAGNOSIS — N200 Calculus of kidney: Secondary | ICD-10-CM | POA: Diagnosis not present

## 2016-04-14 DIAGNOSIS — N319 Neuromuscular dysfunction of bladder, unspecified: Secondary | ICD-10-CM

## 2016-04-14 LAB — MICROSCOPIC EXAMINATION
Epithelial Cells (non renal): >10 /hpf — AB (ref 0–10)
RBC, UA: >30 /hpf — AB (ref 0–?)
WBC, UA: NONE SEEN /hpf (ref 0–?)

## 2016-04-14 LAB — URINALYSIS, COMPLETE
Bilirubin, UA: NEGATIVE
Glucose, UA: NEGATIVE
Leukocytes, UA: NEGATIVE
Nitrite, UA: NEGATIVE
Specific Gravity, UA: >1.03 — ABNORMAL HIGH (ref 1.005–1.030)
Urobilinogen, Ur: 1 mg/dL (ref 0.2–1.0)
pH, UA: 6 (ref 5.0–7.5)

## 2016-04-14 NOTE — Progress Notes (Signed)
04/14/2016 2:40 PM   Christine Chavez [birth year] [id number]  Referring provider: Maisie Fus, MD No address on file  Chief Complaint  Patient presents with  . 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 It is 6 mm at the right UVJ. There is mild right hydroureter nephrosis. She has a nonobstructing 3 mm stone in the right and a nonobstructing 5 mm stone on the left. She has had intermittent flank pain on the right side. This is mostly controlled. He is complaining this time is new onset bladder spasms. She also feels like she needs to go to the bathroom when she goes to catheterize herself not much is able to come out. She finds is very bothersome. She is concerned that there is some problem with her ability catheterize himself.   PMH: Past Medical History:  Diagnosis Date  . Headache   . Renal disorder   . Vision abnormalities     Surgical History: Past Surgical History:  Procedure Laterality Date  . ANTERIOR CRUCIATE LIGAMENT REPAIR  1997  . EXPLORATORY LAPAROTOMY  1999  . TONSILLECTOMY      Home Medications:  Allergies as of 04/14/2016      Reactions   Aspirin Shortness Of Breath, Swelling, Anaphylaxis   Ibuprofen Shortness Of Breath, Swelling, Anaphylaxis      Medication List       Accurate as of 04/14/16  2:40 PM. Always use your most recent med list.          butalbital-acetaminophen-caffeine 50-325-40 MG tablet Commonly known as:  FIORICET, ESGIC Take 1 tablet by mouth every 6 (six) hours as needed for headache.   ciprofloxacin 500 MG tablet Commonly known as:  CIPRO Take 1 tablet (500 mg total) by mouth every 12 (twelve) hours.   levETIRAcetam 750 MG tablet Commonly known as:  KEPPRA Take 1 tablet (750 mg total) by mouth 2 (two) times  daily.   mirtazapine 15 MG tablet Commonly known as:  REMERON TAKE 1 TABLET (15 MG TOTAL) BY MOUTH NIGHTLY.   ondansetron 4 MG disintegrating tablet Commonly known as:  ZOFRAN ODT Take 1 tablet (4 mg total) by mouth every 8 (eight) hours as needed for nausea or vomiting.   ondansetron 4 MG tablet Commonly known as:  ZOFRAN Take 4 mg by mouth every 8 (eight) hours as needed for nausea or vomiting. Reported on 07/21/2015   oxyCODONE-acetaminophen 5-325 MG tablet Commonly known as:  ROXICET Take 1 tablet by mouth every 6 (six) hours as needed.   promethazine 12.5 MG tablet Commonly known as:  PHENERGAN Take 12.5 mg by mouth every 12 (twelve) hours as needed for nausea or vomiting.   rizatriptan 5 MG tablet Commonly known as:  MAXALT Take 5 mg by mouth.   sertraline 100 MG tablet Commonly known as:  ZOLOFT Take 150 mg by mouth.   tamsulosin 0.4 MG Caps capsule Commonly known as:  FLOMAX Take 1 capsule (0.4 mg total) by mouth daily.   traMADol 50 MG tablet Commonly known as:  ULTRAM Take 50 mg by mouth.       Allergies:  Allergies  Allergen Reactions  . Aspirin Shortness Of Breath, Swelling and Anaphylaxis  . Ibuprofen Shortness Of Breath, Swelling and Anaphylaxis    Family History: Family History  Problem Relation Age of Onset  . Hypertension Mother   . Atrial fibrillation Father   . Healthy Brother   . Arthritis/Rheumatoid Paternal Grandmother   . Healthy Brother     Social History:  reports that she has never smoked. She has never used smokeless tobacco. She reports that she does not drink alcohol or use drugs.  ROS: UROLOGY Frequent Urination?: No Hard to postpone urination?: No Burning/pain with urination?: No Get up at night to urinate?: No Leakage of urine?: No Urine stream starts and stops?: Yes Trouble starting stream?: Yes Do you have to strain to urinate?: No Blood in urine?: Yes Urinary tract infection?: No Sexually transmitted disease?:  No Injury to kidneys or bladder?: No Painful intercourse?: No Weak stream?: No Currently pregnant?: No Vaginal bleeding?: No Last menstrual period?: N  Gastrointestinal Nausea?: Yes Vomiting?: Yes Indigestion/heartburn?: No Diarrhea?: No Constipation?: No  Constitutional Fever: No Night sweats?: No Weight loss?: No Fatigue?: No  Skin Skin rash/lesions?: No Itching?: No  Eyes Blurred vision?: No Double vision?: No  Ears/Nose/Throat Sore throat?: Yes Sinus problems?: No  Hematologic/Lymphatic Swollen glands?: No Easy bruising?: No  Cardiovascular Leg swelling?: No Chest pain?: No  Respiratory Cough?: No Shortness of breath?: No  Endocrine Excessive thirst?: No  Musculoskeletal Back pain?: Yes Joint pain?: No  Neurological Headaches?: No Dizziness?: No  Psychologic Depression?: No Anxiety?: No  Physical Exam: BP 118/80 (BP Location: Left Arm, Patient Position: Sitting, Cuff Size: Normal)   Pulse 91   Ht 5\' 5"  (1.651 m)   Wt 146 lb 12.8 oz (66.6 kg)   LMP 04/05/2016   BMI 24.43 kg/m   Constitutional:  Alert and oriented, No acute distress. HEENT: [place] AT, moist mucus membranes.  Trachea midline, no masses. Cardiovascular: No clubbing, cyanosis, or edema. Respiratory: Normal respiratory effort, no increased work of breathing. GI: Abdomen is soft, nontender, nondistended, no abdominal masses GU: No CVA tenderness.  Skin: No rashes, bruises or suspicious lesions. Lymph: No cervical or inguinal adenopathy. Neurologic: Grossly intact, no focal deficits, moving all 4 extremities. Psychiatric: Normal mood and affect.  Laboratory Data: Lab Results  Component Value Date   WBC 11.4 (H) 04/12/2016   HGB 13.6 04/12/2016   HCT 39.1 04/12/2016   MCV 92.1 04/12/2016   PLT 318 04/12/2016    Lab Results  Component Value Date   CREATININE 0.79 04/12/2016    No results found for: PSA  No results found for: TESTOSTERONE  No results found for:  HGBA1C  Urinalysis    Component Value Date/Time   COLORURINE YELLOW (A) 04/12/2016 1438   APPEARANCEUR CLOUDY (A) 04/12/2016 1438   APPEARANCEUR Cloudy (A) 03/24/2016 1527   LABSPEC 1.017 04/12/2016 1438   PHURINE 8.0 04/12/2016 1438   GLUCOSEU NEGATIVE 04/12/2016 1438   HGBUR LARGE (A) 04/12/2016 1438   BILIRUBINUR NEGATIVE 04/12/2016 1438   BILIRUBINUR Negative 03/24/2016 1527   KETONESUR 5 (A) 04/12/2016 1438   PROTEINUR 30 (A) 04/12/2016 1438   NITRITE NEGATIVE 04/12/2016 1438   LEUKOCYTESUR NEGATIVE 04/12/2016 1438   LEUKOCYTESUR Negative 03/24/2016 1527    Assessment & Plan:    1. Nephrolithiasis -The patient's new onset of bladder spasms related to her stone being within the bladder wall. I think she would do both medical expulsive therapy. We'll have her continue her Flomax and pain medication as needed. I given her samples Myrbetriq 50 mg to try to help with her bladder spasms and new onset urinary frequency that is likely related to the stone  via an intermural portion of her bladder wall. We will have her follow-up in 2 weeks with KUB prior.   2. Neurogenic bladder Follow up as scheduled with Dr. Sherron MondayMacDiarmid in June 2018. Continue CIC  Return in about 2 weeks (around 04/28/2016) for KUB prior.  Hildred LaserBrian James Marilou Barnfield, MD  [facility] [address], [address] [place], [place] [id number] [phone]

## 2016-04-16 ENCOUNTER — Encounter: Payer: Self-pay | Admitting: Emergency Medicine

## 2016-04-16 ENCOUNTER — Observation Stay
Admission: EM | Admit: 2016-04-16 | Discharge: 2016-04-19 | Disposition: A | Discharge status: Home / Self Care | Payer: 59 | Attending: Internal Medicine | Admitting: Internal Medicine

## 2016-04-16 DIAGNOSIS — R739 Hyperglycemia, unspecified: Secondary | ICD-10-CM | POA: Diagnosis not present

## 2016-04-16 DIAGNOSIS — R531 Weakness: Secondary | ICD-10-CM | POA: Diagnosis not present

## 2016-04-16 DIAGNOSIS — E876 Hypokalemia: Secondary | ICD-10-CM | POA: Diagnosis not present

## 2016-04-16 DIAGNOSIS — J45909 Unspecified asthma, uncomplicated: Secondary | ICD-10-CM | POA: Diagnosis not present

## 2016-04-16 DIAGNOSIS — N200 Calculus of kidney: Secondary | ICD-10-CM

## 2016-04-16 DIAGNOSIS — Z886 Allergy status to analgesic agent status: Secondary | ICD-10-CM | POA: Insufficient documentation

## 2016-04-16 DIAGNOSIS — N132 Hydronephrosis with renal and ureteral calculous obstruction: Principal | ICD-10-CM | POA: Insufficient documentation

## 2016-04-16 HISTORY — DX: Migraine, unspecified, not intractable, without status migrainosus: G43.909

## 2016-04-16 LAB — URINALYSIS, COMPLETE (UACMP) WITH MICROSCOPIC
Bacteria, UA: NONE SEEN
Bilirubin Urine: NEGATIVE
Glucose, UA: NEGATIVE mg/dL
Ketones, ur: NEGATIVE mg/dL
Leukocytes, UA: NEGATIVE
Nitrite: NEGATIVE
Protein, ur: 30 mg/dL — AB
Specific Gravity, Urine: 1.026 (ref 1.005–1.030)
pH: 6 (ref 5.0–8.0)

## 2016-04-16 LAB — COMPREHENSIVE METABOLIC PANEL
ALT: 29 U/L (ref 14–54)
AST: 18 U/L (ref 15–41)
Albumin: 4.2 g/dL (ref 3.5–5.0)
Alkaline Phosphatase: 61 U/L (ref 38–126)
Anion gap: 6 (ref 5–15)
BUN: 10 mg/dL (ref 6–20)
CO2: 27 mmol/L (ref 22–32)
Calcium: 9 mg/dL (ref 8.9–10.3)
Chloride: 106 mmol/L (ref 101–111)
Creatinine, Ser: 0.69 mg/dL (ref 0.44–1.00)
GFR calc Af Amer: >60 mL/min (ref >60)
GFR calc non Af Amer: >60 mL/min (ref >60)
Glucose, Bld: 106 mg/dL — ABNORMAL HIGH (ref 65–99)
Potassium: 3.3 mmol/L — ABNORMAL LOW (ref 3.5–5.1)
Sodium: 139 mmol/L (ref 135–145)
Total Bilirubin: 1.3 mg/dL — ABNORMAL HIGH (ref 0.3–1.2)
Total Protein: 8 g/dL (ref 6.5–8.1)

## 2016-04-16 LAB — CBC
HCT: 38.7 % (ref 35.0–47.0)
Hemoglobin: 13.3 g/dL (ref 12.0–16.0)
MCH: 32 pg (ref 26.0–34.0)
MCHC: 34.5 g/dL (ref 32.0–36.0)
MCV: 92.7 fL (ref 80.0–100.0)
Platelets: 374 10*3/uL (ref 150–440)
RBC: 4.17 MIL/uL (ref 3.80–5.20)
RDW: 13.3 % (ref 11.5–14.5)
WBC: 8.9 10*3/uL (ref 3.6–11.0)

## 2016-04-16 MED ORDER — ONDANSETRON HCL 4 MG/2ML IJ SOLN
4.0000 mg | INTRAMUSCULAR | Status: DC | PRN
  Administered 2016-04-16 – 2016-04-19 (×9): 4 mg via INTRAVENOUS
  Filled 2016-04-16 (×9): qty 2

## 2016-04-16 MED ORDER — ONDANSETRON HCL 4 MG/2ML IJ SOLN
4.0000 mg | Freq: Once | INTRAMUSCULAR | Status: AC | PRN
  Administered 2016-04-16: 4 mg via INTRAVENOUS
  Filled 2016-04-16: qty 2

## 2016-04-16 MED ORDER — FENTANYL CITRATE (PF) 100 MCG/2ML IJ SOLN
INTRAMUSCULAR | Status: AC
  Administered 2016-04-16: 50 ug via INTRAVENOUS
  Filled 2016-04-16: qty 2

## 2016-04-16 MED ORDER — DIPHENHYDRAMINE HCL 12.5 MG/5ML PO ELIX
12.5000 mg | ORAL_SOLUTION | Freq: Four times a day (QID) | ORAL | Status: DC | PRN
  Administered 2016-04-16 – 2016-04-18 (×2): 12.5 mg via ORAL
  Filled 2016-04-16: qty 10
  Filled 2016-04-16: qty 5

## 2016-04-16 MED ORDER — FENTANYL CITRATE (PF) 100 MCG/2ML IJ SOLN
50.0000 ug | INTRAMUSCULAR | Status: DC | PRN
  Administered 2016-04-16: 50 ug via INTRAVENOUS

## 2016-04-16 MED ORDER — LEVETIRACETAM 750 MG PO TABS
750.0000 mg | ORAL_TABLET | Freq: Two times a day (BID) | ORAL | Status: DC
  Filled 2016-04-16 (×4): qty 1

## 2016-04-16 MED ORDER — TAMSULOSIN HCL 0.4 MG PO CAPS
0.4000 mg | ORAL_CAPSULE | Freq: Every day | ORAL | Status: DC
  Administered 2016-04-16 – 2016-04-19 (×3): 0.4 mg via ORAL
  Filled 2016-04-16 (×3): qty 1

## 2016-04-16 MED ORDER — SERTRALINE HCL 100 MG PO TABS
150.0000 mg | ORAL_TABLET | Freq: Every day | ORAL | Status: DC
  Administered 2016-04-18 – 2016-04-19 (×2): 150 mg via ORAL
  Filled 2016-04-16 (×2): qty 1

## 2016-04-16 MED ORDER — ZOLPIDEM TARTRATE 5 MG PO TABS: 5.0000 mg | ORAL_TABLET | Freq: Every evening | ORAL | Status: DC | PRN

## 2016-04-16 MED ORDER — OXYCODONE HCL 5 MG PO TABS
15.0000 mg | ORAL_TABLET | ORAL | Status: DC | PRN
  Administered 2016-04-16 – 2016-04-19 (×6): 15 mg via ORAL
  Filled 2016-04-16 (×8): qty 3

## 2016-04-16 MED ORDER — DIPHENHYDRAMINE HCL 50 MG/ML IJ SOLN
12.5000 mg | Freq: Four times a day (QID) | INTRAMUSCULAR | Status: DC | PRN
  Administered 2016-04-17: 12.5 mg via INTRAVENOUS
  Filled 2016-04-16: qty 1

## 2016-04-16 MED ORDER — HYDROMORPHONE HCL 1 MG/ML IJ SOLN
0.5000 mg | INTRAMUSCULAR | Status: DC | PRN
  Administered 2016-04-17 – 2016-04-18 (×6): 1 mg via INTRAVENOUS
  Filled 2016-04-16 (×6): qty 1

## 2016-04-16 MED ORDER — ACETAMINOPHEN 325 MG PO TABS
650.0000 mg | ORAL_TABLET | ORAL | Status: DC | PRN
  Administered 2016-04-18: 650 mg via ORAL
  Filled 2016-04-16: qty 2

## 2016-04-16 MED ORDER — SODIUM CHLORIDE 0.9 % IV SOLN
INTRAVENOUS | Status: DC
  Administered 2016-04-16 – 2016-04-17 (×2): via INTRAVENOUS

## 2016-04-16 MED ORDER — MIRTAZAPINE 15 MG PO TABS
15.0000 mg | ORAL_TABLET | Freq: Every day | ORAL | Status: DC
  Administered 2016-04-16 – 2016-04-18 (×3): 15 mg via ORAL
  Filled 2016-04-16 (×3): qty 1

## 2016-04-16 MED ORDER — MORPHINE SULFATE (PF) 4 MG/ML IV SOLN
4.0000 mg | Freq: Once | INTRAVENOUS | Status: AC
  Administered 2016-04-16: 4 mg via INTRAVENOUS
  Filled 2016-04-16: qty 1

## 2016-04-16 NOTE — ED Provider Notes (Signed)
[facility] Emergency Department Provider Note   ____________________________________________    I have reviewed the triage vital signs and the nursing notes.   HISTORY  Chief Complaint Flank Pain and Emesis     HPI Christine Chavez is a 35 y.o. female who presents with right flank pain which is severe. Patient was diagnosed with a 6 mm right UVJ stone on the 27th. She saw her urologist on the 29th who told her she may require a stent. She reports the pain is a severe that she came to the emergency department today forpain relief. Patient does self catheter because of neurogenic bladder   Past Medical History:  Diagnosis Date  . Headache   . Renal disorder   . Vision abnormalities     Patient Active Problem List   Diagnosis Date Noted  . Numbness 07/28/2015  . Bladder retention 06/23/2015  . Abdominal pain 06/04/2015  . Dizziness 05/18/2015  . Neck pain 05/18/2015  . Complicated migraine 04/28/2015  . Other fatigue 04/28/2015  . Abnormal finding on MRI of brain 04/28/2015  . D (diarrhea) 03/29/2015  . H/O disease 03/29/2015  . Abnormal weight loss 03/29/2015  . Muscle weakness (generalized) 03/14/2015  . Headache, migraine 03/10/2015    Past Surgical History:  Procedure Laterality Date  . ANTERIOR CRUCIATE LIGAMENT REPAIR  1997  . EXPLORATORY LAPAROTOMY  1999  . TONSILLECTOMY      Prior to Admission medications   Medication Sig Start Date End Date Taking? Authorizing Provider  butalbital-acetaminophen-caffeine (FIORICET, ESGIC) 50-325-40 MG tablet Take 1 tablet by mouth every 6 (six) hours as needed for headache.    Historical Provider, MD  ciprofloxacin (CIPRO) 500 MG tablet Take 1 tablet (500 mg total) by mouth every 12 (twelve) hours. Patient not taking: Reported on 04/14/2016 03/24/16   Alfredo MartinezScott MacDiarmid, MD  levETIRAcetam (KEPPRA) 750 MG tablet Take 1 tablet (750 mg total) by mouth 2 (two) times daily. Patient not taking:  Reported on 04/14/2016 10/29/15   Asa Lenteichard A Sater, MD  mirtazapine (REMERON) 15 MG tablet TAKE 1 TABLET (15 MG TOTAL) BY MOUTH NIGHTLY. 06/11/15   Historical Provider, MD  ondansetron (ZOFRAN ODT) 4 MG disintegrating tablet Take 1 tablet (4 mg total) by mouth every 8 (eight) hours as needed for nausea or vomiting. 04/12/16   Minna AntisKevin Paduchowski, MD  ondansetron (ZOFRAN) 4 MG tablet Take 4 mg by mouth every 8 (eight) hours as needed for nausea or vomiting. Reported on 07/21/2015    Historical Provider, MD  oxyCODONE-acetaminophen (ROXICET) 5-325 MG tablet Take 1 tablet by mouth every 6 (six) hours as needed. 04/12/16   Minna AntisKevin Paduchowski, MD  promethazine (PHENERGAN) 12.5 MG tablet Take 12.5 mg by mouth every 12 (twelve) hours as needed for nausea or vomiting.  06/11/15 04/12/16  Historical Provider, MD  rizatriptan (MAXALT) 5 MG tablet Take 5 mg by mouth.    Historical Provider, MD  sertraline (ZOLOFT) 100 MG tablet Take 150 mg by mouth. 11/02/15   Historical Provider, MD  tamsulosin (FLOMAX) 0.4 MG CAPS capsule Take 1 capsule (0.4 mg total) by mouth daily. 04/12/16   Minna AntisKevin Paduchowski, MD  traMADol (ULTRAM) 50 MG tablet Take 50 mg by mouth. 02/04/16   Historical Provider, MD     Allergies Aspirin and Ibuprofen  Family History  Problem Relation Age of Onset  . Hypertension Mother   . Atrial fibrillation Father   . Healthy Brother   . Arthritis/Rheumatoid Paternal Grandmother   . Healthy Brother  Social History Social History  Substance Use Topics  . Smoking status: Never Smoker  . Smokeless tobacco: Never Used  . Alcohol use No    Review of Systems  Constitutional: No fever/chills Eyes: No visual changes.   Cardiovascular: Denies chest pain. Respiratory: Denies shortness of breath. Gastrointestinal: As above  Genitourinary: Bladder spasms Musculoskeletal: Negative for back pain. Skin: Negative for rash.   10-point ROS otherwise  negative.  ____________________________________________   PHYSICAL EXAM:  VITAL SIGNS: ED Triage Vitals  Enc Vitals Group     BP 04/16/16 1111 (!) 141/94     Pulse Rate 04/16/16 1111 85     Resp 04/16/16 1111 16     Temp 04/16/16 1111 98 F (36.7 C)     Temp Source 04/16/16 1111 Oral     SpO2 04/16/16 1111 99 %     Weight --      Height --      Head Circumference --      Peak Flow --      Pain Score 04/16/16 1109 10     Pain Loc --      Pain Edu? --      Excl. in GC? --     Constitutional: Alert and oriented. No acute distress. Pleasant and interactive Eyes: Conjunctivae are normal.   Nose: No congestion/rhinnorhea. Mouth/Throat: Mucous membranes are moist.    Cardiovascular: Normal rate, regular rhythm.   Good peripheral circulation. Respiratory: Normal respiratory effort.  No retractions. Gastrointestinal: Soft and nontender. No distention.  No CVA tenderness. Genitourinary: deferred Musculoskeletal: No lower extremity tenderness nor edema.  Warm and well perfused Neurologic:  Normal speech and language. No gross focal neurologic deficits are appreciated.  Skin:  Skin is warm, dry and intact. No rash noted. Psychiatric: Mood and affect are normal. Speech and behavior are normal.  ____________________________________________   LABS (all labs ordered are listed, but only abnormal results are displayed)  Labs Reviewed  URINALYSIS, COMPLETE (UACMP) WITH MICROSCOPIC - Abnormal; Notable for the following:       Result Value   Color, Urine YELLOW (*)    APPearance HAZY (*)    Hgb urine dipstick LARGE (*)    Protein, ur 30 (*)    Squamous Epithelial / LPF 0-5 (*)    All other components within normal limits  COMPREHENSIVE METABOLIC PANEL - Abnormal; Notable for the following:    Potassium 3.3 (*)    Glucose, Bld 106 (*)    Total Bilirubin 1.3 (*)    All other components within normal limits  CBC    ____________________________________________  EKG  None ____________________________________________  RADIOLOGY  Reviewed CT scan from 12/27 ____________________________________________   PROCEDURES  Procedure(s) performed: No    Critical Care performed: No ____________________________________________   INITIAL IMPRESSION / ASSESSMENT AND PLAN / ED COURSE  Pertinent labs & imaging results that were available during my care of the patient were reviewed by me and considered in my medical decision making (see chart for details).  Patient presents with severe right flank pain likely related to 6 mm UVJ stone. We'll contact urology and work on establishing pain relief.  Clinical Course   Patient continues to have pain after fentanyl and morphine. Discussed with Dr. Thea Silversmith, he will admit the patient ____________________________________________   FINAL CLINICAL IMPRESSION(S) / ED DIAGNOSES  Final diagnoses:  Kidney stone      NEW MEDICATIONS STARTED DURING THIS VISIT:  New Prescriptions   No medications on file  Note:  This document was prepared using Dragon voice recognition software and may include unintentional dictation errors.    Jene Everyobert Cordell Coke, MD 04/16/16 [phone]

## 2016-04-16 NOTE — ED Triage Notes (Signed)
Patient to ED via POV, Patient states that she was diagnosed with a 6mm kidney stone on the right side on Thursday. Patient was seen by urology on Friday and told that she may need surgery to help her pass the stone. Patient states that she has been unable to control her pain. Patient had oxycodone at home that she took for the pain but it is not helping. Patient is also having N/V.

## 2016-04-16 NOTE — ED Notes (Signed)
1 attempt made to call report, nurse unavailable.

## 2016-04-17 ENCOUNTER — Encounter
Admission: EM | Disposition: A | Discharge status: Home / Self Care | Payer: Self-pay | Source: Home / Self Care | Attending: Emergency Medicine

## 2016-04-17 ENCOUNTER — Observation Stay: Payer: 59 | Admitting: Registered Nurse

## 2016-04-17 ENCOUNTER — Encounter: Payer: Self-pay | Admitting: Anesthesiology

## 2016-04-17 DIAGNOSIS — N201 Calculus of ureter: Secondary | ICD-10-CM | POA: Diagnosis not present

## 2016-04-17 DIAGNOSIS — N132 Hydronephrosis with renal and ureteral calculous obstruction: Secondary | ICD-10-CM | POA: Diagnosis not present

## 2016-04-17 HISTORY — PX: KIDNEY STONE SURGERY: SHX686

## 2016-04-17 HISTORY — PX: CYSTOSCOPY WITH STENT PLACEMENT: SHX5790

## 2016-04-17 LAB — POCT I-STAT 4, (NA,K, GLUC, HGB,HCT)
Glucose, Bld: 104 mg/dL — ABNORMAL HIGH (ref 65–99)
HCT: 34 % — ABNORMAL LOW (ref 36.0–46.0)
Hemoglobin: 11.6 g/dL — ABNORMAL LOW (ref 12.0–15.0)
Potassium: 3.5 mmol/L (ref 3.5–5.1)
Sodium: 143 mmol/L (ref 135–145)

## 2016-04-17 LAB — PREGNANCY, URINE: Preg Test, Ur: NEGATIVE

## 2016-04-17 SURGERY — CYSTOSCOPY, WITH STENT INSERTION
Anesthesia: General | Site: Urethra | Laterality: Right | Wound class: Clean

## 2016-04-17 MED ORDER — LIDOCAINE HCL (CARDIAC) 20 MG/ML IV SOLN
INTRAVENOUS | Status: DC | PRN
  Administered 2016-04-17: 60 mg via INTRAVENOUS

## 2016-04-17 MED ORDER — CEFAZOLIN SODIUM 1 G IJ SOLR
INTRAMUSCULAR | Status: AC
  Filled 2016-04-17: qty 10

## 2016-04-17 MED ORDER — HYDROMORPHONE HCL 1 MG/ML IJ SOLN
INTRAMUSCULAR | Status: AC
  Administered 2016-04-17: 0.25 mg via INTRAVENOUS
  Filled 2016-04-17: qty 1

## 2016-04-17 MED ORDER — PROPOFOL 10 MG/ML IV BOLUS
INTRAVENOUS | Status: DC | PRN
Start: 2016-04-17 — End: 2016-04-17
  Administered 2016-04-17: 150 mg via INTRAVENOUS

## 2016-04-17 MED ORDER — METHYLPREDNISOLONE SODIUM SUCC 125 MG IJ SOLR
INTRAMUSCULAR | Status: AC
  Filled 2016-04-17: qty 2

## 2016-04-17 MED ORDER — SULFAMETHOXAZOLE-TRIMETHOPRIM 800-160 MG PO TABS: 1.0000 | ORAL_TABLET | Freq: Two times a day (BID) | ORAL | 0 refills | Status: DC

## 2016-04-17 MED ORDER — DEXAMETHASONE SODIUM PHOSPHATE 10 MG/ML IJ SOLN
INTRAMUSCULAR | Status: DC | PRN
  Administered 2016-04-17: 10 mg via INTRAVENOUS

## 2016-04-17 MED ORDER — HYDROMORPHONE HCL 1 MG/ML IJ SOLN
0.2500 mg | INTRAMUSCULAR | Status: DC | PRN
Start: 2016-04-17 — End: 2016-04-17
  Administered 2016-04-17: 0.25 mg via INTRAVENOUS

## 2016-04-17 MED ORDER — FENTANYL CITRATE (PF) 100 MCG/2ML IJ SOLN
INTRAMUSCULAR | Status: AC
  Filled 2016-04-17: qty 2

## 2016-04-17 MED ORDER — METHYLPREDNISOLONE SODIUM SUCC 125 MG IJ SOLR
125.0000 mg | Freq: Once | INTRAMUSCULAR | Status: AC
  Administered 2016-04-17: 125 mg via INTRAVENOUS

## 2016-04-17 MED ORDER — PROPOFOL 500 MG/50ML IV EMUL
INTRAVENOUS | Status: AC
  Filled 2016-04-17: qty 100

## 2016-04-17 MED ORDER — METHYLPREDNISOLONE SODIUM SUCC 125 MG IJ SOLR
INTRAMUSCULAR | Status: AC
  Administered 2016-04-17: 125 mg via INTRAVENOUS
  Filled 2016-04-17: qty 2

## 2016-04-17 MED ORDER — MEPERIDINE HCL 25 MG/ML IJ SOLN
12.5000 mg | Freq: Once | INTRAMUSCULAR | Status: AC
  Administered 2016-04-17: 12.5 mg via INTRAVENOUS

## 2016-04-17 MED ORDER — MIDAZOLAM HCL 2 MG/2ML IJ SOLN
INTRAMUSCULAR | Status: AC
  Filled 2016-04-17: qty 2

## 2016-04-17 MED ORDER — ONDANSETRON HCL 4 MG/2ML IJ SOLN
INTRAMUSCULAR | Status: AC
  Filled 2016-04-17: qty 2

## 2016-04-17 MED ORDER — PROPOFOL 10 MG/ML IV BOLUS
INTRAVENOUS | Status: AC
  Filled 2016-04-17: qty 20

## 2016-04-17 MED ORDER — SUCCINYLCHOLINE CHLORIDE 20 MG/ML IJ SOLN
INTRAMUSCULAR | Status: DC | PRN
  Administered 2016-04-17: 80 mg via INTRAVENOUS

## 2016-04-17 MED ORDER — FENTANYL CITRATE (PF) 100 MCG/2ML IJ SOLN
25.0000 ug | INTRAMUSCULAR | Status: DC | PRN
  Administered 2016-04-17 (×3): 25 ug via INTRAVENOUS

## 2016-04-17 MED ORDER — PHENYLEPHRINE HCL 10 MG/ML IJ SOLN
INTRAMUSCULAR | Status: AC
  Filled 2016-04-17: qty 1

## 2016-04-17 MED ORDER — LIDOCAINE 2% (20 MG/ML) 5 ML SYRINGE
INTRAMUSCULAR | Status: AC
  Filled 2016-04-17: qty 5

## 2016-04-17 MED ORDER — FENTANYL CITRATE (PF) 100 MCG/2ML IJ SOLN
INTRAMUSCULAR | Status: AC
  Administered 2016-04-17: 25 ug via INTRAVENOUS
  Filled 2016-04-17: qty 2

## 2016-04-17 MED ORDER — SODIUM CHLORIDE 0.9 % IJ SOLN
INTRAMUSCULAR | Status: AC
  Filled 2016-04-17: qty 10

## 2016-04-17 MED ORDER — LACTATED RINGERS IV SOLN
INTRAVENOUS | Status: DC | PRN
  Administered 2016-04-17: 15:00:00 via INTRAVENOUS

## 2016-04-17 MED ORDER — MEPERIDINE HCL 25 MG/ML IJ SOLN
INTRAMUSCULAR | Status: AC
  Administered 2016-04-17: 12.5 mg via INTRAVENOUS
  Filled 2016-04-17: qty 1

## 2016-04-17 MED ORDER — ONDANSETRON HCL 4 MG/2ML IJ SOLN
4.0000 mg | Freq: Once | INTRAMUSCULAR | Status: AC | PRN
  Administered 2016-04-17: 4 mg via INTRAVENOUS

## 2016-04-17 MED ORDER — OXYCODONE-ACETAMINOPHEN 5-325 MG PO TABS: 1.0000 | ORAL_TABLET | Freq: Four times a day (QID) | ORAL | 0 refills | Status: DC | PRN

## 2016-04-17 MED ORDER — IOTHALAMATE MEGLUMINE 43 % IV SOLN
INTRAVENOUS | Status: DC | PRN
  Administered 2016-04-17: 5 mL

## 2016-04-17 MED ORDER — FLUMAZENIL 0.5 MG/5ML IV SOLN
0.2000 mg | Freq: Once | INTRAVENOUS | Status: AC
  Administered 2016-04-17: 0.2 mg via INTRAVENOUS

## 2016-04-17 MED ORDER — FENTANYL CITRATE (PF) 100 MCG/2ML IJ SOLN
INTRAMUSCULAR | Status: DC | PRN
  Administered 2016-04-17: 50 ug via INTRAVENOUS

## 2016-04-17 MED ORDER — ROCURONIUM BROMIDE 50 MG/5ML IV SOSY
PREFILLED_SYRINGE | INTRAVENOUS | Status: AC
  Filled 2016-04-17: qty 5

## 2016-04-17 MED ORDER — SUCCINYLCHOLINE CHLORIDE 200 MG/10ML IV SOSY
PREFILLED_SYRINGE | INTRAVENOUS | Status: AC
  Filled 2016-04-17: qty 10

## 2016-04-17 MED ORDER — CEFAZOLIN SODIUM 1 G IJ SOLR
INTRAMUSCULAR | Status: DC | PRN
  Administered 2016-04-17: 1 g via INTRAMUSCULAR

## 2016-04-17 MED ORDER — MIDAZOLAM HCL 2 MG/2ML IJ SOLN
INTRAMUSCULAR | Status: DC | PRN
  Administered 2016-04-17: 2 mg via INTRAVENOUS

## 2016-04-17 MED ORDER — DEXAMETHASONE SODIUM PHOSPHATE 10 MG/ML IJ SOLN
INTRAMUSCULAR | Status: AC
  Filled 2016-04-17: qty 1

## 2016-04-17 MED ORDER — FLUMAZENIL 0.5 MG/5ML IV SOLN
INTRAVENOUS | Status: AC
  Administered 2016-04-17: 0.2 mg via INTRAVENOUS
  Filled 2016-04-17: qty 5

## 2016-04-17 SURGICAL SUPPLY — 20 items
BAG DRAIN CYSTO-URO LG1000N (MISCELLANEOUS) ×2 IMPLANT
BASKET ZERO TIP 1.9FR (BASKET) ×2 IMPLANT
CATH URETL 5X70 OPEN END (CATHETERS) ×2 IMPLANT
CONRAY 43 FOR UROLOGY 50M (MISCELLANEOUS) ×2 IMPLANT
GLOVE BIO SURGEON STRL SZ7 (GLOVE) ×2 IMPLANT
GLOVE BIO SURGEON STRL SZ8 (GLOVE) ×2 IMPLANT
GOWN STRL REUS W/ TWL LRG LVL3 (GOWN DISPOSABLE) ×1 IMPLANT
GOWN STRL REUS W/TWL LRG LVL3 (GOWN DISPOSABLE) ×1
GUIDEWIRE STR ZIPWIRE 035X150 (MISCELLANEOUS) ×2 IMPLANT
KIT RM TURNOVER CYSTO AR (KITS) ×2 IMPLANT
PACK CYSTO AR (MISCELLANEOUS) ×2 IMPLANT
PREP PVP WINGED SPONGE (MISCELLANEOUS) ×2 IMPLANT
SET CYSTO W/LG BORE CLAMP LF (SET/KITS/TRAYS/PACK) ×2 IMPLANT
SOL .9 NS 3000ML IRR  AL (IV SOLUTION) ×1
SOL .9 NS 3000ML IRR UROMATIC (IV SOLUTION) ×1 IMPLANT
STENT URET 6FRX24 CONTOUR (STENTS) IMPLANT
STENT URET 6FRX26 CONTOUR (STENTS) ×2 IMPLANT
SURGILUBE 2OZ TUBE FLIPTOP (MISCELLANEOUS) ×2 IMPLANT
SYRINGE IRR TOOMEY STRL 70CC (SYRINGE) ×2 IMPLANT
WATER STERILE IRR 1000ML POUR (IV SOLUTION) ×2 IMPLANT

## 2016-04-17 NOTE — Discharge Instructions (Signed)
Ureteral Stent Implantation, Care After °Introduction °Refer to this sheet in the next few weeks. These instructions provide you with information about caring for yourself after your procedure. Your health care provider may also give you more specific instructions. Your treatment has been planned according to current medical practices, but problems sometimes occur. Call your health care provider if you have any problems or questions after your procedure. °What can I expect after the procedure? °After the procedure, it is common to have: °· Nausea. °· Mild pain when you urinate. You may feel this pain in your lower back or lower abdomen. Pain should stop within a few minutes after you urinate. This may last for up to 1 week. °· A small amount of blood in your urine for several days. °Follow these instructions at home: °  °Medicines °· Take over-the-counter and prescription medicines only as told by your health care provider. °· If you were prescribed an antibiotic medicine, take it as told by your health care provider. Do not stop taking the antibiotic even if you start to feel better. °· Do not drive for 24 hours if you received a sedative. °· Do not drive or operate heavy machinery while taking prescription pain medicines. °Activity °· Return to your normal activities as told by your health care provider. Ask your health care provider what activities are safe for you. °· Do not lift anything that is heavier than 10 lb (4.5 kg). Follow this limit for 1 week after your procedure, or for as long as told by your health care provider. °General instructions °· Watch for any blood in your urine. Call your health care provider if the amount of blood in your urine increases. °· If you have a catheter: °¨ Follow instructions from your health care provider about taking care of your catheter and collection bag. °¨ Do not take baths, swim, or use a hot tub until your health care provider approves. °· Drink enough fluid to keep  your urine clear or pale yellow. °· Keep all follow-up visits as told by your health care provider. This is important. °Contact a health care provider if: °· You have pain that gets worse or does not get better with medicine, especially pain when you urinate. °· You have difficulty urinating. °· You feel nauseous or you vomit repeatedly during a period of more than 2 days after the procedure. °Get help right away if: °· Your urine is dark red or has blood clots in it. °· You are leaking urine (have incontinence). °· The end of the stent comes out of your urethra. °· You cannot urinate. °· You have sudden, sharp, or severe pain in your abdomen or lower back. °· You have a fever. °This information is not intended to replace advice given to you by your health care provider. Make sure you discuss any questions you have with your health care provider. °Document Released: 12/04/2012 Document Revised: 09/09/2015 Document Reviewed: 10/16/2014 °© 2017 [employer] ° °

## 2016-04-17 NOTE — H&P (Signed)
Urology Admission H&P  Chief Complaint: right flank pain  History of Present Illness: Ms Christine Chavez is a 35yo with neurogenic bladder who presented to the ER 6 days ago with severe right flank pain. The pain was sharp, intermittient, severe and nonradiating. She had associated nausea and vomiting. She was diagnosed with a 6mm R UVJ calculus. She has since failed medical therapy and presented last night to the ER with intractable pain. She has severe urgency, frequency and a feeling of incomplete emptying. No other associated symtpoms. No exacerbating/alleviating events  Past Medical History:  Diagnosis Date  . Headache   . Renal disorder   . Vision abnormalities    Past Surgical History:  Procedure Laterality Date  . ANTERIOR CRUCIATE LIGAMENT REPAIR  1997  . EXPLORATORY LAPAROTOMY  1999  . TONSILLECTOMY      Home Medications:  Prescriptions Prior to Admission  Medication Sig Dispense Refill Last Dose  . mirtazapine (REMERON) 15 MG tablet TAKE 1 TABLET (15 MG TOTAL) BY MOUTH NIGHTLY.  3 04/15/2016 at 2200  . ondansetron (ZOFRAN ODT) 4 MG disintegrating tablet Take 1 tablet (4 mg total) by mouth every 8 (eight) hours as needed for nausea or vomiting. 20 tablet 0 prn at prn  . oxyCODONE-acetaminophen (ROXICET) 5-325 MG tablet Take 1 tablet by mouth every 6 (six) hours as needed. 20 tablet 0 04/16/2016 at 0800  . promethazine (PHENERGAN) 12.5 MG tablet Take 12.5 mg by mouth every 12 (twelve) hours as needed for nausea or vomiting.    04/16/2016 at 0800  . sertraline (ZOLOFT) 100 MG tablet Take 150 mg by mouth.   04/15/2016 at 0800  . tamsulosin (FLOMAX) 0.4 MG CAPS capsule Take 1 capsule (0.4 mg total) by mouth daily. 30 capsule 0 04/15/2016 at 0800  . butalbital-acetaminophen-caffeine (FIORICET, ESGIC) 50-325-40 MG tablet Take 1 tablet by mouth every 6 (six) hours as needed for headache.   prn at prn  . ciprofloxacin (CIPRO) 500 MG tablet Take 1 tablet (500 mg total) by mouth every 12 (twelve)  hours. (Patient not taking: Reported on 04/14/2016) 14 tablet 0 Not Taking  . levETIRAcetam (KEPPRA) 750 MG tablet Take 1 tablet (750 mg total) by mouth 2 (two) times daily. (Patient not taking: Reported on 04/14/2016) 180 tablet 3 Not Taking  . rizatriptan (MAXALT) 5 MG tablet Take 5 mg by mouth.   prn at prn   Allergies:  Allergies  Allergen Reactions  . Aspirin Shortness Of Breath, Swelling and Anaphylaxis  . Ibuprofen Shortness Of Breath, Swelling and Anaphylaxis    Family History  Problem Relation Age of Onset  . Hypertension Mother   . Atrial fibrillation Father   . Healthy Brother   . Arthritis/Rheumatoid Paternal Grandmother   . Healthy Brother    Social History:  reports that she has never smoked. She has never used smokeless tobacco. She reports that she does not drink alcohol or use drugs.  Review of Systems  Gastrointestinal: Positive for nausea and vomiting.  Genitourinary: Positive for flank pain, frequency and urgency.  Neurological: Positive for weakness.  All other systems reviewed and are negative.   Physical Exam:  Vital signs in last 24 hours: Temp:  [97.8 F (36.6 C)-98.2 F (36.8 C)] 97.8 F (36.6 C) (01/01 0912) Pulse Rate:  [69-86] 72 (01/01 0912) Resp:  [16-20] 18 (01/01 0505) BP: (89-141)/(56-94) 120/82 (01/01 0923) SpO2:  [97 %-100 %] 100 % (01/01 0912) Weight:  [67 kg (147 lb 11.2 oz)] 67 kg (147 lb 11.2 oz) (  12/31 2100) Physical Exam  Constitutional: She is oriented to person, place, and time. She appears well-developed and well-nourished.  HENT:  Head: Normocephalic and atraumatic.  Eyes: EOM are normal. Pupils are equal, round, and reactive to light.  Neck: Normal range of motion. No thyromegaly present.  Cardiovascular: Normal rate and regular rhythm.   Respiratory: No respiratory distress.  GI: Soft. She exhibits no distension.  Musculoskeletal: Normal range of motion. She exhibits no edema.  Neurological: She is alert and oriented to  person, place, and time.  Skin: Skin is warm and dry.  Psychiatric: She has a normal mood and affect. Her behavior is normal. Judgment and thought content normal.    Laboratory Data:  Results for orders placed or performed during the hospital encounter of 04/16/16 (from the past 24 hour(s))  Urinalysis, Complete w Microscopic     Status: Abnormal   Collection Time: 04/16/16 11:24 AM  Result Value Ref Range   Color, Urine YELLOW (A) YELLOW   APPearance HAZY (A) CLEAR   Specific Gravity, Urine 1.026 1.005 - 1.030   pH 6.0 5.0 - 8.0   Glucose, UA NEGATIVE NEGATIVE mg/dL   Hgb urine dipstick LARGE (A) NEGATIVE   Bilirubin Urine NEGATIVE NEGATIVE   Ketones, ur NEGATIVE NEGATIVE mg/dL   Protein, ur 30 (A) NEGATIVE mg/dL   Nitrite NEGATIVE NEGATIVE   Leukocytes, UA NEGATIVE NEGATIVE   RBC / HPF TOO NUMEROUS TO COUNT 0 - 5 RBC/hpf   WBC, UA 0-5 0 - 5 WBC/hpf   Bacteria, UA NONE SEEN NONE SEEN   Squamous Epithelial / LPF 0-5 (A) NONE SEEN   Mucous PRESENT    Ca Oxalate Crys, UA PRESENT   Comprehensive metabolic panel     Status: Abnormal   Collection Time: 04/16/16 11:24 AM  Result Value Ref Range   Sodium 139 135 - 145 mmol/L   Potassium 3.3 (L) 3.5 - 5.1 mmol/L   Chloride 106 101 - 111 mmol/L   CO2 27 22 - 32 mmol/L   Glucose, Bld 106 (H) 65 - 99 mg/dL   BUN 10 6 - 20 mg/dL   Creatinine, Ser 1.61 0.44 - 1.00 mg/dL   Calcium 9.0 8.9 - 09.6 mg/dL   Total Protein 8.0 6.5 - 8.1 g/dL   Albumin 4.2 3.5 - 5.0 g/dL   AST 18 15 - 41 U/L   ALT 29 14 - 54 U/L   Alkaline Phosphatase 61 38 - 126 U/L   Total Bilirubin 1.3 (H) 0.3 - 1.2 mg/dL   GFR calc non Af Amer >60 >60 mL/min   GFR calc Af Amer >60 >60 mL/min   Anion gap 6 5 - 15  CBC     Status: None   Collection Time: 04/16/16 11:24 AM  Result Value Ref Range   WBC 8.9 3.6 - 11.0 K/uL   RBC 4.17 3.80 - 5.20 MIL/uL   Hemoglobin 13.3 12.0 - 16.0 g/dL   HCT 04.5 40.9 - 81.1 %   MCV 92.7 80.0 - 100.0 fL   MCH 32.0 26.0 - 34.0 pg    MCHC 34.5 32.0 - 36.0 g/dL   RDW 91.4 78.2 - 95.6 %   Platelets 374 150 - 440 K/uL   Recent Results (from the past 240 hour(s))  CULTURE, URINE COMPREHENSIVE     Status: None (Preliminary result)   Collection Time: 04/14/16  2:21 PM  Result Value Ref Range Status   Urine Culture, Comprehensive Preliminary report  Preliminary   Result  1 Comment  Preliminary    Comment: Growth insufficient.  Culture reincubated.  Microscopic Examination     Status: Abnormal   Collection Time: 04/14/16  2:21 PM  Result Value Ref Range Status   WBC, UA None seen 0 - 5 /hpf Final   RBC, UA >30 (A) 0 - 2 /hpf Final   Epithelial Cells (non renal) >10 (A) 0 - 10 /hpf Final   Bacteria, UA Few None seen/Few Final   Creatinine:  Recent Labs  04/12/16 1436 04/16/16 1124  CREATININE 0.79 0.69   Baseline Creatinine: 0.7  Impression/Assessment:  35yo with right ureteral calculus and intractable pain  Plan:  The patient is admitted for pain control and is scheduled for stone extraction. The risks/benefits/alternatives to right ureteroscopic stone extraction was explained to the patient and she understands and wishes to proceed with surgery.  Wilkie Aye 04/17/2016, 10:30 AM

## 2016-04-17 NOTE — Transfer of Care (Signed)
Immediate Anesthesia Transfer of Care Note  Patient: Christine Chavez  Procedure(s) Performed: Procedure(s): CYSTOSCOPY WITH STENT PLACEMENT (Right)  Patient Location: PACU  Anesthesia Type:General  Level of Consciousness: sedated  Airway & Oxygen Therapy: Patient Spontanous Breathing and Patient connected to nasal cannula oxygen  Post-op Assessment: Report given to RN and Post -op Vital signs reviewed and stable  Post vital signs: Reviewed and stable  Last Vitals:  Vitals:   04/17/16 0923 04/17/16 1547  BP: 120/82 119/84  Pulse:  100  Resp:  (!) 8  Temp:  36.9 C    Last Pain:  Vitals:   04/17/16 1200  TempSrc:   PainSc: 2          Complications: No apparent anesthesia complications

## 2016-04-17 NOTE — Consult Note (Signed)
[employer] - [place] [facility]   PATIENT NAME: Christine Chavez    MR#:  [id number]  DATE OF BIRTH:  [birth year]  DATE OF ADMISSION:  04/16/2016  PRIMARY CARE PHYSICIAN: COE, Roberto Scales, MD   CONSULT REQUESTING/REFERRING PHYSICIAN: Dr. Okey Regal  REASON FOR CONSULT: Acute weakness  CHIEF COMPLAINT:   Chief Complaint  Patient presents with  . Flank Pain  . Emesis    HISTORY OF PRESENT ILLNESS:  Christine Chavez  is a 36 y.o. female with a known history of Migraines, upper and lower extremity weakness being worked up for possible multiple sclerosis at [facility] and was admitted to the hospital by urology for a right ureteral calculus. She just had cystoscopy with stone extraction and stent placement. Patient had general anesthesia. After her procedure patient has been drowsy and unable to move her arms and legs and hospitalist team was urgently consulted. On arriving patient seems drowsy but able to answer questions. She is moving her arms and legs more at this time. She has had some weakness in her upper and lower extremities and amylase with a walker. Had multiple MRIs including spinal tap and is being worked up by neurology at [facility]. Afebrile. No recent change in medications.  Husband mentions that she has had prolonged effects from medications in the past.  She received Versed, succinylcholine and general anesthesia.  PAST MEDICAL HISTORY:   Past Medical History:  Diagnosis Date  . Headache   . Migraines   . Renal disorder   . Vision abnormalities     PAST SURGICAL HISTOIRY:   Past Surgical History:  Procedure Laterality Date  . ANTERIOR CRUCIATE LIGAMENT REPAIR  1997  . EXPLORATORY LAPAROTOMY  1999  . TONSILLECTOMY      SOCIAL HISTORY:   Social History  Substance Use Topics  . Smoking status: Never Smoker  . Smokeless tobacco: Never Used  . Alcohol use No    FAMILY HISTORY:   Family History  Problem  Relation Age of Onset  . Hypertension Mother   . Atrial fibrillation Father   . Healthy Brother   . Arthritis/Rheumatoid Paternal Grandmother   . Healthy Brother     DRUG ALLERGIES:   Allergies  Allergen Reactions  . Aspirin Shortness Of Breath, Swelling and Anaphylaxis  . Ibuprofen Shortness Of Breath, Swelling and Anaphylaxis   REVIEW OF SYSTEMS:   ROS  CONSTITUTIONAL: No fever, fatigue or weakness.  EYES: No blurred or double vision.  EARS, NOSE, AND THROAT: No tinnitus or ear pain.  RESPIRATORY: No cough, shortness of breath, wheezing or hemoptysis.  CARDIOVASCULAR: No chest pain, orthopnea, edema.  GASTROINTESTINAL: No nausea, vomiting, diarrhea or abdominal pain.  GENITOURINARY: No dysuria, hematuria.  ENDOCRINE: No polyuria, nocturia,  HEMATOLOGY: No anemia, easy bruising or bleeding SKIN: No rash or lesion. MUSCULOSKELETAL: No joint pain or arthritis.   NEUROLOGIC: No tingling, numbness, weakness.  PSYCHIATRY: No anxiety or depression.   MEDICATIONS AT HOME:   Prior to Admission medications   Medication Sig Start Date End Date Taking? Authorizing Provider  mirtazapine (REMERON) 15 MG tablet TAKE 1 TABLET (15 MG TOTAL) BY MOUTH NIGHTLY. 06/11/15  Yes Historical Provider, MD  ondansetron (ZOFRAN ODT) 4 MG disintegrating tablet Take 1 tablet (4 mg total) by mouth every 8 (eight) hours as needed for nausea or vomiting. 04/12/16  Yes Minna Antis, MD  promethazine (PHENERGAN) 12.5 MG tablet Take 12.5 mg by mouth every 12 (twelve) hours as needed for  nausea or vomiting.  06/11/15 04/16/17 Yes Historical Provider, MD  sertraline (ZOLOFT) 100 MG tablet Take 150 mg by mouth. 11/02/15  Yes Historical Provider, MD  tamsulosin (FLOMAX) 0.4 MG CAPS capsule Take 1 capsule (0.4 mg total) by mouth daily. 04/12/16  Yes Minna AntisKevin Paduchowski, MD  butalbital-acetaminophen-caffeine (FIORICET, ESGIC) 217115855750-325-40 MG tablet Take 1 tablet by mouth every 6 (six) hours as needed for headache.     Historical Provider, MD  ciprofloxacin (CIPRO) 500 MG tablet Take 1 tablet (500 mg total) by mouth every 12 (twelve) hours. Patient not taking: Reported on 04/14/2016 03/24/16   Alfredo MartinezScott MacDiarmid, MD  levETIRAcetam (KEPPRA) 750 MG tablet Take 1 tablet (750 mg total) by mouth 2 (two) times daily. Patient not taking: Reported on 04/14/2016 10/29/15   Asa Lenteichard A Sater, MD  oxyCODONE-acetaminophen (ROXICET) 5-325 MG tablet Take 1 tablet by mouth every 6 (six) hours as needed. 04/17/16   Malen GauzePatrick L McKenzie, MD  rizatriptan (MAXALT) 5 MG tablet Take 5 mg by mouth.    Historical Provider, MD  sulfamethoxazole-trimethoprim (BACTRIM DS,SEPTRA DS) 800-160 MG tablet Take 1 tablet by mouth 2 (two) times daily. 04/17/16   Malen GauzePatrick L McKenzie, MD      VITAL SIGNS:  Blood pressure 122/87, pulse 76, temperature 98.5 F (36.9 C), resp. rate 12, height 5\' 5"  (1.651 m), weight 67 kg (147 lb 11.2 oz), last menstrual period 04/05/2016, SpO2 97 %.  PHYSICAL EXAMINATION:  GENERAL:  36 y.o.-year-old patient lying in the bed with no acute distress.  EYES: Pupils equal, round, reactive to light and accommodation. No scleral icterus. Extraocular muscles intact.  HEENT: Head atraumatic, normocephalic. Oropharynx and nasopharynx clear.  NECK:  Supple, no jugular venous distention. No thyroid enlargement, no tenderness.  LUNGS: Normal breath sounds bilaterally, no wheezing, rales,rhonchi or crepitation. No use of accessory muscles of respiration.  CARDIOVASCULAR: S1, S2 normal. No murmurs, rubs, or gallops.  ABDOMEN: Soft, nontender, nondistended. Bowel sounds present. No organomegaly or mass.  EXTREMITIES: No pedal edema, cyanosis, or clubbing.  NEUROLOGIC: Cranial nerves II through XII are intact. Muscle strength 4-/5 in all extremities. Sensation intact. Gait not checked.  PSYCHIATRIC: The patient is alert and oriented x 3.  SKIN: No obvious rash, lesion, or ulcer.   LABORATORY PANEL:   CBC  Recent Labs Lab  04/16/16 1124 04/17/16 1730  WBC 8.9  --   HGB 13.3 11.6*  HCT 38.7 34.0*  PLT 374  --    ------------------------------------------------------------------------------------------------------------------  Chemistries   Recent Labs Lab 04/16/16 1124 04/17/16 1730  NA 139 143  K 3.3* 3.5  CL 106  --   CO2 27  --   GLUCOSE 106* 104*  BUN 10  --   CREATININE 0.69  --   CALCIUM 9.0  --   AST 18  --   ALT 29  --   ALKPHOS 61  --   BILITOT 1.3*  --    ------------------------------------------------------------------------------------------------------------------  Cardiac Enzymes No results for input(s): TROPONINI in the last 168 hours. ------------------------------------------------------------------------------------------------------------------  RADIOLOGY:  No results found.  EKG:  No orders found for this or any previous visit.  IMPRESSION AND PLAN:   * Acute weakness Since generalized including her upper and lower extremity's. Patient seems drowsy. Likely post anesthesia drowsiness. No focal neurological deficits. Discussed Dr. Okey Regalarol of anesthesia. Can monitor for some more time. If there is no improvement will get a CT scan of the head without contrast.  She has no diagnosis for her chronic upper and lower extremity  weakness. She follows at [facility] neurology.  * Right ureteral calculus Status post cystoscopy, right retrograde pyelography, Right ureteroscopic stone manipulation with basket extraction, Right 6 x 24 JJ stent placement. Management as per urology  All the records are reviewed and case discussed with Consulting provider. Management plans discussed with the patient, family and they are in agreement.  TOTAL TIME TAKING CARE OF THIS PATIENT: 35 minutes.   Milagros Loll R M.D on 04/17/2016 at 5:55 PM  Between 7am to 6pm - Pager - [phone]  After 6pm go to [email or web address] - password [email or web address] [facility]  [facility] [place] [facility]   Office  [phone]  CC: Primary care Physician: COE, Roberto Scales, MD  Note: This dictation was prepared with Dragon dictation along with smaller phrase technology. Any transcriptional errors that result from this process are unintentional.

## 2016-04-17 NOTE — Op Note (Signed)
Preoperative diagnosis: Right ureteral stone  Postoperative diagnosis: Same  Procedure: 1 cystoscopy 2 right retrograde pyelography 3.  Intraoperative fluoroscopy, under one hour, with interpretation 4.  Right ureteroscopic stone manipulation with basket extraction 5.  Right 6 x 24 JJ stent placement  Attending: Cleda MccreedyPatrick Mackenzie  Anesthesia: General  Estimated blood loss: None  Drains: Right 6 x 24 JJ ureteral stent with tether  Specimens: right ureteral calculus  Antibiotics: ancef  Findings: moderate right hydroureteronephrosis. Distal ureteral calculus removed with basket.  Indications: Patient is a 36 year old female with a history of ureteral stone and who has failed medical expulsive therapy.  After discussing treatment options, she decided proceed with right ureteroscopic stone manipulation.  Procedure her in detail: The patient was brought to the operating room and a brief timeout was done to ensure correct patient, correct procedure, correct site.  General anesthesia was administered patient was placed in dorsal lithotomy position.  Her genitalia was then prepped and draped in usual sterile fashion.  A rigid 22 French cystoscope was passed in the urethra and the bladder.  Bladder was inspected free masses or lesions.  the right ureteral orifices were in the normal orthotopic locations.  a 6 french ureteral catheter was then instilled into the right ureter orifice.  a gentle retrograde was obtained and findings noted above.  we then placed a zip wire through the ureteral catheter and advanced up to the renal pelvis.  we then removed the cystoscope and cannulated the right ureteral orifice with a semirigid ureteroscope.  we then encountered the stone in the distal ureter.  Using an escape basket the stone was removed. We then placed a 6 x 24 double-j ureteral stent over the original zip wire. We removed the wire and good coil was noted in the the renal pelvis under fluoroscopy and  the bladder under direct vision.   the bladder was then drained and this concluded the procedure which was well tolerated by patient.  Complications: None  Condition: Stable, extubated, transferred to PACU  Plan: Patient is to be discharged home as to follow-up in 2 weeks. She is to remove her stent by pulling the tether in 3 days

## 2016-04-17 NOTE — Anesthesia Preprocedure Evaluation (Addendum)
Anesthesia Evaluation  Patient identified by MRN, date of birth, ID band Patient awake    Reviewed: Allergy & Precautions, NPO status , Patient's Chart, lab work & pertinent test results  Airway Mallampati: II  TM Distance: >3 FB     Dental no notable dental hx.    Pulmonary neg pulmonary ROS, asthma ,    Pulmonary exam normal        Cardiovascular negative cardio ROS Normal cardiovascular exam     Neuro/Psych  Headaches, Being investigated for possible MS...lower extremity weakness  Neuromuscular disease negative psych ROS   GI/Hepatic negative GI ROS, Neg liver ROS,   Endo/Other  negative endocrine ROS  Renal/GU stones Bladder dysfunction      Musculoskeletal negative musculoskeletal ROS (+)   Abdominal Normal abdominal exam  (+)   Peds negative pediatric ROS (+)  Hematology negative hematology ROS (+)   Anesthesia Other Findings Past Medical History: No date: Headache No date: Renal disorder No date: Vision abnormalities Uses walker at home periodically. Being evaluated by neurology for possible MS  Reproductive/Obstetrics                         Anesthesia Physical Anesthesia Plan  ASA: II and emergent  Anesthesia Plan: General   Post-op Pain Management:    Induction: Intravenous  Airway Management Planned: Oral ETT  Additional Equipment:   Intra-op Plan:   Post-operative Plan: Extubation in OR  Informed Consent: I have reviewed the patients History and Physical, chart, labs and discussed the procedure including the risks, benefits and alternatives for the proposed anesthesia with the patient or authorized representative who has indicated his/her understanding and acceptance.   Dental advisory given  Plan Discussed with: CRNA and Surgeon  Anesthesia Plan Comments:         Anesthesia Quick Evaluation

## 2016-04-17 NOTE — Anesthesia Procedure Notes (Addendum)
Procedure Name: Intubation Date/Time: 04/17/2016 3:04 PM Performed by: Hedda Slade Pre-anesthesia Checklist: Patient identified, Emergency Drugs available, Suction available and Patient being monitored Patient Re-evaluated:Patient Re-evaluated prior to inductionOxygen Delivery Method: Circle system utilized Preoxygenation: Pre-oxygenation with 100% oxygen Intubation Type: IV induction Ventilation: Mask ventilation without difficulty Laryngoscope Size: Mac and 3 Grade View: Grade I Tube type: Oral Tube size: 7.0 mm Number of attempts: 1 Airway Equipment and Method: Stylet Placement Confirmation: ETT inserted through vocal cords under direct vision,  positive ETCO2 and breath sounds checked- equal and bilateral Secured at: 21 cm Tube secured with: Tape Dental Injury: Teeth and Oropharynx as per pre-operative assessment

## 2016-04-18 ENCOUNTER — Other Ambulatory Visit: Payer: Self-pay | Admitting: Urology

## 2016-04-18 ENCOUNTER — Ambulatory Visit: Payer: 59 | Admitting: Psychiatry

## 2016-04-18 ENCOUNTER — Encounter: Payer: Self-pay | Admitting: Urology

## 2016-04-18 DIAGNOSIS — R109 Unspecified abdominal pain: Secondary | ICD-10-CM

## 2016-04-18 DIAGNOSIS — N132 Hydronephrosis with renal and ureteral calculous obstruction: Secondary | ICD-10-CM | POA: Diagnosis not present

## 2016-04-18 MED ORDER — TRAMADOL HCL 50 MG PO TABS
50.0000 mg | ORAL_TABLET | Freq: Once | ORAL | Status: AC
  Administered 2016-04-18: 50 mg via ORAL
  Filled 2016-04-18: qty 1

## 2016-04-18 MED ORDER — BISACODYL 5 MG PO TBEC
5.0000 mg | DELAYED_RELEASE_TABLET | Freq: Every day | ORAL | Status: DC | PRN
  Administered 2016-04-18 – 2016-04-19 (×2): 5 mg via ORAL
  Filled 2016-04-18 (×3): qty 1

## 2016-04-18 MED ORDER — SULFAMETHOXAZOLE-TRIMETHOPRIM 800-160 MG PO TABS
1.0000 | ORAL_TABLET | Freq: Two times a day (BID) | ORAL | Status: DC
  Administered 2016-04-18 – 2016-04-19 (×3): 1 via ORAL
  Filled 2016-04-18 (×3): qty 1

## 2016-04-18 MED ORDER — OXYCODONE HCL 5 MG PO TABS: 5.0000 mg | ORAL_TABLET | ORAL | Status: DC | PRN

## 2016-04-18 MED ORDER — OXYBUTYNIN CHLORIDE 5 MG PO TABS
5.0000 mg | ORAL_TABLET | Freq: Three times a day (TID) | ORAL | Status: DC | PRN
  Administered 2016-04-18 – 2016-04-19 (×4): 5 mg via ORAL
  Filled 2016-04-18 (×4): qty 1

## 2016-04-18 MED ORDER — HYDROMORPHONE HCL 1 MG/ML IJ SOLN: 0.5000 mg | INTRAMUSCULAR | Status: DC | PRN

## 2016-04-18 NOTE — Evaluation (Signed)
Physical Therapy Evaluation Patient Details Name: Christine Chavez MRN: [id number] DOB: 1981-01-07 Today's Date: 04/18/2016   History of Present Illness  36 yo female recently diagnosed with MS and expecting follow up appointments with specialists was admitted for cystoscopy and removal of stone, now with stent. Has pain and chronic mm changes in legs that are more dramatic for weakness and spasm since her surgery.  PMHx:  neurogenic bladder, HA, vision changes, ACL repair, and home with 4 children  Clinical Impression  Pt is home with husband and 4 children, but has some family assistance with her mother if needed.  Pt is not going to be able to walk alone with her current level of debility and will need 24/7 assistance or will be a candidate for inpt rehab.  Will follow acutely as needed for strengthening and gait for return to home safely.    Follow Up Recommendations Home health PT;Supervision for mobility/OOB    Equipment Recommendations  Rolling walker with 5" wheels    Recommendations for Other Services       Precautions / Restrictions Precautions Precautions: Fall (telemetry) Restrictions Weight Bearing Restrictions: No      Mobility  Bed Mobility Overal bed mobility: Needs Assistance Bed Mobility: Supine to Sit;Sit to Supine     Supine to sit: Min assist Sit to supine: Min assist   General bed mobility comments: using bed rail and careful body mechanics to transition to side of bed to avoid stressing surgery area.  Transfers Overall transfer level: Needs assistance Equipment used: Rolling walker (2 wheeled);1 person hand held assist Transfers: Sit to/from [employer]Stand;Stand Pivot Transfers Sit to Stand: Min assist;Mod assist;From elevated surface Stand pivot transfers: Min assist;Mod assist       General transfer comment: pt is in pain and is major limitation and after walk was weak from mm fasciculations  Ambulation/Gait Ambulation/Gait assistance: Min assist;+2  physical assistance;+2 safety/equipment Ambulation Distance (Feet): 12 Feet Assistive device: Rolling walker (2 wheeled);1 person hand held assist Gait Pattern/deviations: Step-through pattern;Trunk flexed;Wide base of support;Shuffle;Decreased stride length Gait velocity: reduced Gait velocity interpretation: Below normal speed for age/gender General Gait Details: weak and has tremors in both legs, more severe than usual per pt  Stairs            Wheelchair Mobility    Modified Rankin (Stroke Patients Only)       Balance                                             Pertinent Vitals/Pain Pain Assessment: 0-10 Pain Score: 8  Pain Location: abd pain Pain Descriptors / Indicators: Aching;Operative site guarding Pain Intervention(s): Limited activity within patient's tolerance;Monitored during session;Repositioned;Premedicated before session    Home Living Family/patient expects to be discharged to:: Private residence Living Arrangements: Spouse/significant other Available Help at Discharge: Family;Available 24 hours/day Type of Home: House Home Access: Stairs to enter Entrance Stairs-Rails: Right;Left;Can reach both Entrance Stairs-Number of Steps: 4 Home Layout: One level Home Equipment: Walker - 4 wheels;Wheelchair - manual Additional Comments: usually furniture walks at home, as pt is comfortable with her environment    Prior Function Level of Independence: Independent with assistive device(s)         Comments: has children who are all ages to be up and walking and can do much of their own care     Hand Dominance  Extremity/Trunk Assessment   Upper Extremity Assessment Upper Extremity Assessment: Overall WFL for tasks assessed    Lower Extremity Assessment Lower Extremity Assessment: Generalized weakness    Cervical / Trunk Assessment Cervical / Trunk Assessment: Normal  Communication   Communication: No difficulties   Cognition Arousal/Alertness: Awake/alert Behavior During Therapy: WFL for tasks assessed/performed Overall Cognitive Status: Within Functional Limits for tasks assessed                      General Comments      Exercises     Assessment/Plan    PT Assessment Patient needs continued PT services  PT Problem List Decreased strength;Decreased range of motion;Decreased activity tolerance;Decreased balance;Decreased mobility;Decreased coordination;Decreased knowledge of use of DME;Decreased safety awareness;Cardiopulmonary status limiting activity;Pain          PT Treatment Interventions DME instruction;Gait training;Stair training;Functional mobility training;Therapeutic activities;Therapeutic exercise;Balance training;Neuromuscular re-education;Patient/family education    PT Goals (Current goals can be found in the Care Plan section)  Acute Rehab PT Goals Patient Stated Goal: to get home to kids PT Goal Formulation: With patient/family Time For Goal Achievement: 05/02/16 Potential to Achieve Goals: Good    Frequency Min 3X/week   Barriers to discharge Inaccessible home environment steps to enter house    Co-evaluation               End of Session Equipment Utilized During Treatment: Gait belt;Other (comment) (wheelchair) Activity Tolerance: Patient limited by fatigue;Patient limited by lethargy;Other (comment) (muscle fasciculations ) Patient left: in bed;with call bell/phone within reach;with bed alarm set;with family/visitor present Nurse Communication: Mobility status    Functional Assessment Tool Used: clinical judgment Functional Limitation: Mobility: Walking and moving around Mobility: Walking and Moving Around Current Status (Z6109): At least 40 percent but less than 60 percent impaired, limited or restricted Mobility: Walking and Moving Around Goal Status [phone]): At least 1 percent but less than 20 percent impaired, limited or restricted    Time:  1305-1336 PT Time Calculation (min) (ACUTE ONLY): 31 min   Charges:   PT Evaluation $PT Eval Moderate Complexity: 1 Procedure PT Treatments $Gait Training: 8-22 mins   PT G Codes:   PT G-Codes **NOT FOR INPATIENT CLASS** Functional Assessment Tool Used: clinical judgment Functional Limitation: Mobility: Walking and moving around Mobility: Walking and Moving Around Current Status (U9811): At least 40 percent but less than 60 percent impaired, limited or restricted Mobility: Walking and Moving Around Goal Status [phone]): At least 1 percent but less than 20 percent impaired, limited or restricted    Ivar Drape 04/18/2016, 3:40 PM   Christine Chavez, PT MS Acute Rehab Dept. Number: [facility] [id number] and [facility] [phone]

## 2016-04-18 NOTE — Progress Notes (Addendum)
Pt medicated for mild bladder spasms. Pt also unable to void since 1 pm today, reports urinary retention with need to I/O cath at home d/t possible MS symptoms. Dr. Anne HahnWillis made aware of above details, new orders received. Will monitor.  Addendum: 300 cc bloody urine obtained during I/O cath. Pt tolerated procedure well, advised to increase fluid intake.

## 2016-04-18 NOTE — Progress Notes (Signed)
Urology Consult Follow Up  Subjective: Patient's weakness improved during the night.  She is still slightly unsteady on her legs.  She does complain of right-sided pain and is concerned about pain management upon discharge.  She has not seen a reduction in her right sided pain since this am.  She is also experiencing nausea and is not tolerating solids.  She feels constipated.    Anti-infectives: Anti-infectives    Start     Dose/Rate Route Frequency Ordered Stop   04/17/16 0000  sulfamethoxazole-trimethoprim (BACTRIM DS,SEPTRA DS) 800-160 MG tablet     1 tablet Oral 2 times daily 04/17/16 1538        Current Facility-Administered Medications  Medication Dose Route Frequency Provider Last Rate Last Dose  . acetaminophen (TYLENOL) tablet 650 mg  650 mg Oral Q4H PRN Malen GauzePatrick L McKenzie, MD      . diphenhydrAMINE (BENADRYL) injection 12.5-25 mg  12.5-25 mg Intravenous Q6H PRN Malen GauzePatrick L McKenzie, MD   12.5 mg at 04/17/16 0513   Or  . diphenhydrAMINE (BENADRYL) 12.5 MG/5ML elixir 12.5-25 mg  12.5-25 mg Oral Q6H PRN Malen GauzePatrick L McKenzie, MD   12.5 mg at 04/16/16 2113  . fentaNYL (SUBLIMAZE) injection 50 mcg  50 mcg Intravenous Q30 min PRN Jene Everyobert Kinner, MD   50 mcg at 04/16/16 1127  . HYDROmorphone (DILAUDID) injection 0.5-1 mg  0.5-1 mg Intravenous Q2H PRN Malen GauzePatrick L McKenzie, MD   1 mg at 04/18/16 0406  . levETIRAcetam (KEPPRA) tablet 750 mg  750 mg Oral BID Malen GauzePatrick L McKenzie, MD      . mirtazapine (REMERON) tablet 15 mg  15 mg Oral QHS Malen GauzePatrick L McKenzie, MD   15 mg at 04/17/16 2222  . ondansetron (ZOFRAN) injection 4 mg  4 mg Intravenous Q4H PRN Malen GauzePatrick L McKenzie, MD   4 mg at 04/17/16 1858  . oxybutynin (DITROPAN) tablet 5 mg  5 mg Oral TID PRN Malen GauzePatrick L McKenzie, MD   5 mg at 04/18/16 0406  . oxyCODONE (Oxy IR/ROXICODONE) immediate release tablet 15 mg  15 mg Oral Q4H PRN Malen GauzePatrick L McKenzie, MD   15 mg at 04/18/16 0046  . sertraline (ZOLOFT) tablet 150 mg  150 mg Oral Daily Malen GauzePatrick L  McKenzie, MD      . tamsulosin [facility](FLOMAX) capsule 0.4 mg  0.4 mg Oral Daily Malen GauzePatrick L McKenzie, MD   0.4 mg at 04/16/16 2109  . traMADol (ULTRAM) tablet 50 mg  50 mg Oral Once Malen GauzePatrick L McKenzie, MD      . zolpidem [facility](AMBIEN) tablet 5 mg  5 mg Oral QHS PRN Malen GauzePatrick L McKenzie, MD         Objective: Vital signs in last 24 hours: Temp:  [97.5 F (36.4 C)-99 F (37.2 C)] 97.5 F (36.4 C) (01/02 0358) Pulse Rate:  [72-108] 92 (01/02 0358) Resp:  [8-27] 18 (01/02 0358) BP: (89-143)/(56-95) 131/86 (01/02 0358) SpO2:  [93 %-100 %] 98 % (01/02 0358)  Intake/Output from previous day: 01/01 0701 - 01/02 0700 In: 660 [P.O.:60; I.V.:600] Out: 1500 [Urine:1500] Intake/Output this shift: No intake/output data recorded.   Physical Exam Constitutional: Well nourished. Alert and oriented, No acute distress. HEENT: [place] AT, moist mucus membranes. Trachea midline, no masses. Cardiovascular: No clubbing, cyanosis, or edema. Respiratory: Normal respiratory effort, no increased work of breathing. GI: Abdomen is soft, non tender, non distended, no abdominal masses. Liver and spleen not palpable.  No hernias appreciated.  Stool sample for occult testing is not indicated.   GU:  Right CVA tenderness.  No bladder fullness or masses.   Skin: No rashes, bruises or suspicious lesions. Lymph: No cervical or inguinal adenopathy. Neurologic: Grossly intact, no focal deficits, moving all 4 extremities. Psychiatric: Normal mood and affect.  Lab Results:   Recent Labs  04/16/16 1124 04/17/16 1730  WBC 8.9  --   HGB 13.3 11.6*  HCT 38.7 34.0*  PLT 374  --    BMET  Recent Labs  04/16/16 1124 04/17/16 1730  NA 139 143  K 3.3* 3.5  CL 106  --   CO2 27  --   GLUCOSE 106* 104*  BUN 10  --   CREATININE 0.69  --   CALCIUM 9.0  --    PT/INR No results for input(s): LABPROT, INR in the last 72 hours. ABG No results for input(s): PHART, HCO3 in the last 72 hours.  Invalid input(s): PCO2,  PO2  Studies/Results: No results found.   Assessment and Plan Patient is s/p right ureteroscopy with stone extraction and placement of ureteral stent on 04/17/2016.    She continues to have intractable pain and nausea.  Patient was initiated on ditropan for spasms yesterday evening.  Will give laxative to see if having a stool will reduce the pain.  Will start Septra DS as this will be her antibiotic at discharge.  Will keep patient overnight and reassess in the am.      LOS: 0 days    [facility] 04/18/2016

## 2016-04-18 NOTE — Progress Notes (Signed)
[facility] [place] [facility]   PATIENT NAME: Christine FeeMelissa Chavez    MR#:  [id number]  DATE OF BIRTH:  June 03, 1980  SUBJECTIVE:  CHIEF COMPLAINT:   Chief Complaint  Patient presents with  . Flank Pain  . Emesis   The patient is a 36 year old Caucasian female with past medical history significant for history of migraines, low extremity weakness, concerning for multiple sclerosis, [facility], who presented to the hospital for right ureteral calculus, she underwent cystoscopy and stone extraction as well as stent placement and was noted to have significant weakness in upper and lower extremities. Hospitalist services were contacted for consultation. It was felt that patient had prolonged effect of medications. She is able to move her upper extremities well today, however, admits of having some lower extremity weakness. Her pain is also very poorly controlled and she is very uncomfortable. Review of Systems  Constitutional: Negative for chills, fever and weight loss.  HENT: Negative for congestion.   Eyes: Negative for blurred vision and double vision.  Respiratory: Negative for cough, sputum production, shortness of breath and wheezing.   Cardiovascular: Negative for chest pain, palpitations, orthopnea, leg swelling and PND.  Gastrointestinal: Negative for abdominal pain, blood in stool, constipation, diarrhea, nausea and vomiting.  Genitourinary: Positive for dysuria and flank pain. Negative for frequency, hematuria and urgency.  Musculoskeletal: Negative for falls.  Neurological: Positive for focal weakness. Negative for dizziness, tremors and headaches.  Endo/Heme/Allergies: Does not bruise/bleed easily.  Psychiatric/Behavioral: Negative for depression. The patient does not have insomnia.     VITAL SIGNS: Blood pressure 110/73, pulse 82, temperature 97.4 F (36.3 C), temperature source Oral, resp. rate 16, height 5\' 5"  (1.651 m),  weight 67 kg (147 lb 11.2 oz), last menstrual period 04/05/2016, SpO2 97 %.  PHYSICAL EXAMINATION:   GENERAL:  36 y.o.-year-old patient lying in the bed with no acute distress, sitting in the wheelchair.  EYES: Pupils equal, round, reactive to light and accommodation. No scleral icterus. Extraocular muscles intact.  HEENT: Head atraumatic, normocephalic. Oropharynx and nasopharynx clear.  NECK:  Supple, no jugular venous distention. No thyroid enlargement, no tenderness.  LUNGS: Normal breath sounds bilaterally, no wheezing, rales,rhonchi or crepitation. No use of accessory muscles of respiration.  CARDIOVASCULAR: S1, S2 normal. No murmurs, rubs, or gallops.  ABDOMEN: Soft, nontender, nondistended. Bowel sounds present. No organomegaly or mass.  EXTREMITIES: No pedal edema, cyanosis, or clubbing.  NEUROLOGIC: Cranial nerves II through XII are intact. Muscle strength 5/5 in upper extremities, 4 out of 5 in lower extremities bilaterally. Sensation was grossly intact. Gait not checked.  PSYCHIATRIC: The patient is alert and oriented x 3.  SKIN: No obvious rash, lesion, or ulcer.   ORDERS/RESULTS REVIEWED:   CBC  Recent Labs Lab 04/12/16 1436 04/16/16 1124 04/17/16 1730  WBC 11.4* 8.9  --   HGB 13.6 13.3 11.6*  HCT 39.1 38.7 34.0*  PLT 318 374  --   MCV 92.1 92.7  --   MCH 32.0 32.0  --   MCHC 34.7 34.5  --   RDW 13.6 13.3  --    ------------------------------------------------------------------------------------------------------------------  Chemistries   Recent Labs Lab 04/12/16 1436 04/16/16 1124 04/17/16 1730  NA 139 139 143  K 3.1* 3.3* 3.5  CL 106 106  --   CO2 24 27  --   GLUCOSE 120* 106* 104*  BUN 9 10  --   CREATININE 0.79 0.69  --   CALCIUM 9.1  9.0  --   AST  --  18  --   ALT  --  29  --   ALKPHOS  --  61  --   BILITOT  --  1.3*  --     ------------------------------------------------------------------------------------------------------------------ estimated creatinine clearance is 87.5 mL/min (by C-G formula based on SCr of 0.69 mg/dL). ------------------------------------------------------------------------------------------------------------------ No results for input(s): TSH, T4TOTAL, T3FREE, THYROIDAB in the last 72 hours.  Invalid input(s): FREET3  Cardiac Enzymes No results for input(s): CKMB, TROPONINI, MYOGLOBIN in the last 168 hours.  Invalid input(s): CK ------------------------------------------------------------------------------------------------------------------ Invalid input(s): POCBNP ---------------------------------------------------------------------------------------------------------------  RADIOLOGY: No results found.  EKG: No orders found for this or any previous visit.  ASSESSMENT AND PLAN:  Active Problems:   Nephrolithiasis  #1. Generalized weakness, get physical therapist involved for recommendations, possible home health services upon discharge, improving after procedure #2. Nephrolithiasis, status post right ureteral stent placement, continue pain management, antispasmodic Ditropan #3. Hypokalemia, resolved #4. Hyperglycemia, get hemoglobin A1c to rule out diabetes  Management plans discussed with the patient, family and they are in agreement.   DRUG ALLERGIES:  Allergies  Allergen Reactions  . Aspirin Shortness Of Breath, Swelling and Anaphylaxis  . Ibuprofen Shortness Of Breath, Swelling and Anaphylaxis    CODE STATUS:     Code Status Orders        Start     Ordered   04/16/16 1541  Full code  Continuous     04/16/16 1542    Code Status History    Date Active Date Inactive Code Status Order ID Comments User Context   This patient has a current code status but no historical code status.      TOTAL TIME TAKING CARE OF THIS PATIENT: 30 minutes.  Discussed  this patient's mother, all questions were answered, they voiced understanding  Katrisha Segall M.D on 04/18/2016 at 5:14 PM  Between 7am to 6pm - Pager - [phone]  After 6pm go to [email or web address] - password [email or web address] [facility]  [facility]  Office  [phone]  CC: Primary care physician; COE, Roberto Scales, MD

## 2016-04-19 DIAGNOSIS — N200 Calculus of kidney: Secondary | ICD-10-CM | POA: Diagnosis not present

## 2016-04-19 DIAGNOSIS — N132 Hydronephrosis with renal and ureteral calculous obstruction: Secondary | ICD-10-CM | POA: Diagnosis not present

## 2016-04-19 LAB — HEMOGLOBIN A1C
Hgb A1c MFr Bld: 5 % (ref 4.8–5.6)
Mean Plasma Glucose: 97 mg/dL

## 2016-04-19 LAB — CULTURE, URINE COMPREHENSIVE

## 2016-04-19 MED ORDER — POLYETHYLENE GLYCOL 3350 17 G PO PACK
17.0000 g | PACK | Freq: Every day | ORAL | Status: DC | PRN
  Administered 2016-04-19: 17 g via ORAL
  Filled 2016-04-19: qty 1

## 2016-04-19 NOTE — Progress Notes (Signed)
Pt unable to void this AM, minimal fluids consumed overnight. Will notify Dr. Winona LegatoVaickute, encouraged patient to increase fluid intake. Pt denies distress.

## 2016-04-19 NOTE — Discharge Summary (Signed)
Date of admission: 04/16/2016  Date of discharge: 04/19/2016  Admission diagnosis: Right ureteral stone  Discharge diagnosis: right ureteral stone, generalized weakness, hypokalemia, hyperglycemia  Secondary diagnoses:  Patient Active Problem List   Diagnosis Date Noted  . Nephrolithiasis 04/16/2016  . Numbness 07/28/2015  . Bladder retention 06/23/2015  . Abdominal pain 06/04/2015  . Dizziness 05/18/2015  . Neck pain 05/18/2015  . Complicated migraine 94/70/9628  . Other fatigue 04/28/2015  . Abnormal finding on MRI of brain 04/28/2015  . D (diarrhea) 03/29/2015  . H/O disease 03/29/2015  . Abnormal weight loss 03/29/2015  . Muscle weakness (generalized) 03/14/2015  . Headache, migraine 03/10/2015    History and Physical: For full details, please see admission history and physical. Briefly, Christine Chavez is a 36 y.o. year old patient with ureteral stone s/p ureteroscopy admitted post op for weakness.   Hospital Course: Patient tolerated the procedure well.  She was then transferred to the floor after an uneventful PACU stay.  Her hospital course was uncomplicated.  On POD#2 she had met discharge criteria: was eating a regular diet, was up and ambulating independently,  pain was well controlled, was voiding without a catheter, and was ready to for discharge.  Medicine service was consulted for generalized weakness and other medical issues which resolved prior to discharge.   Laboratory values:   Recent Labs  04/17/16 1730  HGB 11.6*  HCT 34.0*    Recent Labs  04/17/16 1730  NA 143  K 3.5  GLUCOSE 104*   No results for input(s): LABPT, INR in the last 72 hours. No results for input(s): LABURIN in the last 72 hours. Results for orders placed or performed in visit on 04/14/16  CULTURE, URINE COMPREHENSIVE     Status: Abnormal   Collection Time: 04/14/16  2:21 PM  Result Value Ref Range Status   Urine Culture, Comprehensive Final report (A)  Final   Result 1  Comment (A)  Final    Comment: Staphylococcus epidermidis 5,000  Colonies/mL Based on resistance to penicillin and susceptibility to oxacillin this isolate would be susceptible to: * Penicillinase-stable penicillins; such as:     Cloxacillin     Dicloxacillin     Nafcillin * Beta-lactam/beta-lactamase inhibitor combinations; such as:     Amoxicillin-clavulanic acid     Ampicillin-sulbactam * Antistaphylococcal cephems; such as:     Cefaclor     Cefuroxime * Antistaphylococcal carbapenems; such as:     Imipenem     Meropenem    Result 2 Comment  Final    Comment: Viridans streptococcus group Greater than 100,000 colony forming units per mL Susceptibility not normally performed on this organism.    ANTIMICROBIAL SUSCEPTIBILITY Comment  Final    Comment:       ** S = Susceptible; I = Intermediate; R = Resistant **                    P = Positive; N = Negative             MICS are expressed in micrograms per mL    Antibiotic                 RSLT#1    RSLT#2    RSLT#3    RSLT#4 Ciprofloxacin                  S Gentamicin  S Levofloxacin                   S Linezolid                      S Nitrofurantoin                 S Oxacillin                      S Penicillin                     R Quinupristin/Dalfopristin      S Rifampin                       S Tetracycline                   S Trimethoprim/Sulfa             S Vancomycin                     S   Microscopic Examination     Status: Abnormal   Collection Time: 04/14/16  2:21 PM  Result Value Ref Range Status   WBC, UA None seen 0 - 5 /hpf Final   RBC, UA >30 (A) 0 - 2 /hpf Final   Epithelial Cells (non renal) >10 (A) 0 - 10 /hpf Final   Bacteria, UA Few None seen/Few Final    Disposition: Home  Discharge instruction: The patient was instructed to be ambulatory but told to refrain from heavy lifting, strenuous activity, or driving while taking narcotics.   Discharge medications: Allergies as of  04/19/2016      Reactions   Aspirin Shortness Of Breath, Swelling, Anaphylaxis   Ibuprofen Shortness Of Breath, Swelling, Anaphylaxis      Medication List    TAKE these medications   butalbital-acetaminophen-caffeine 50-325-40 MG tablet Commonly known as:  FIORICET, ESGIC Take 1 tablet by mouth every 6 (six) hours as needed for headache.   ciprofloxacin 500 MG tablet Commonly known as:  CIPRO Take 1 tablet (500 mg total) by mouth every 12 (twelve) hours.   levETIRAcetam 750 MG tablet Commonly known as:  KEPPRA Take 1 tablet (750 mg total) by mouth 2 (two) times daily.   mirtazapine 15 MG tablet Commonly known as:  REMERON TAKE 1 TABLET (15 MG TOTAL) BY MOUTH NIGHTLY.   ondansetron 4 MG disintegrating tablet Commonly known as:  ZOFRAN ODT Take 1 tablet (4 mg total) by mouth every 8 (eight) hours as needed for nausea or vomiting.   oxyCODONE-acetaminophen 5-325 MG tablet Commonly known as:  ROXICET Take 1 tablet by mouth every 6 (six) hours as needed.   promethazine 12.5 MG tablet Commonly known as:  PHENERGAN Take 12.5 mg by mouth every 12 (twelve) hours as needed for nausea or vomiting.   rizatriptan 5 MG tablet Commonly known as:  MAXALT Take 5 mg by mouth.   sertraline 100 MG tablet Commonly known as:  ZOLOFT Take 150 mg by mouth.   sulfamethoxazole-trimethoprim 800-160 MG tablet Commonly known as:  BACTRIM DS,SEPTRA DS Take 1 tablet by mouth 2 (two) times daily.   tamsulosin 0.4 MG Caps capsule Commonly known as:  FLOMAX Take 1 capsule (0.4 mg total) by mouth daily.       Followup:  Follow-up [facility]. Call in 2 weeks.  Specialty:  Urology Contact information: [address] [place] [phone]

## 2016-04-19 NOTE — Care Management (Signed)
Patient to discharge home today with home health PT services.  Patient lives at home with husband and 3 children.  Husband, family, and friends provide transportation.  PCP [facility].  Pharmacy [facility] [place].  Home health agency preference provide.  Patient states that she does not have preference,  [facility] Patient has Rollator, cane, and WC in the home. RNCM signing off

## 2016-04-19 NOTE — Progress Notes (Signed)
Patient said that her neurologist discontinued the keppra.  Dr Winona LegatoVaickute notified and gave order to discontinue keppra

## 2016-04-19 NOTE — Progress Notes (Signed)
No BM since 12/30.  Dr Winona Legatovaickute gave order for miralax

## 2016-04-19 NOTE — Progress Notes (Signed)
[facility] [place] [facility]   [facility]: [facility]    MR#:  [id number]  DATE OF BIRTH:  July 14, 1980  SUBJECTIVE:  CHIEF COMPLAINT:   Chief Complaint  Patient presents with  . Flank Pain  . Emesis   The patient is a 36 year old Caucasian female with past medical history significant for history of migraines, low extremity weakness, concerning for multiple sclerosis, [facility], who presented to the hospital for right ureteral calculus, she underwent cystoscopy and stone extraction as well as stent placement and was noted to have significant weakness in upper and lower extremities. Hospitalist services were contacted for consultation. It was felt that patient had prolonged effect of medications. She is able to move her upper And lower extremities better today, however, was recommended to have home health services by physical therapy. She is going to be discharged today, she is comfortable to returning back home with home health services, does not want to go to rehabilitation facility   Review of Systems  Constitutional: Negative for chills, fever and weight loss.  HENT: Negative for congestion.   Eyes: Negative for blurred vision and double vision.  Respiratory: Negative for cough, sputum production, shortness of breath and wheezing.   Cardiovascular: Negative for chest pain, palpitations, orthopnea, leg swelling and PND.  Gastrointestinal: Negative for abdominal pain, blood in stool, constipation, diarrhea, nausea and vomiting.  Genitourinary: Positive for dysuria and flank pain. Negative for frequency, hematuria and urgency.  Musculoskeletal: Negative for falls.  Neurological: Positive for focal weakness. Negative for dizziness, tremors and headaches.  Endo/Heme/Allergies: Does not bruise/bleed easily.  Psychiatric/Behavioral: Negative for depression. The patient does not have insomnia.     VITAL SIGNS: Blood pressure  100/64, pulse 67, temperature 97.8 F (36.6 C), temperature source Oral, resp. rate 16, height 5\' 5"  (1.651 m), weight 67 kg (147 lb 11.2 oz), last menstrual period 04/05/2016, SpO2 98 %.  PHYSICAL EXAMINATION:   GENERAL:  36 y.o.-year-old patient lying in the bed with no acute distress, laying in bed, comfortable EYES: Pupils equal, round, reactive to light and accommodation. No scleral icterus. Extraocular muscles intact.  HEENT: Head atraumatic, normocephalic. Oropharynx and nasopharynx clear.  NECK:  Supple, no jugular venous distention. No thyroid enlargement, no tenderness.  LUNGS: Normal breath sounds bilaterally, no wheezing, rales,rhonchi or crepitation. No use of accessory muscles of respiration.  CARDIOVASCULAR: S1, S2 normal. No murmurs, rubs, or gallops.  ABDOMEN: Soft, nontender, nondistended. Bowel sounds present. No organomegaly or mass.  EXTREMITIES: No pedal edema, cyanosis, or clubbing.  NEUROLOGIC: Cranial nerves II through XII are intact. Muscle strength 4/5 in upper and lower extremities Sensation was grossly intact. Gait not checked. Deep tendon reflexes are brisk PSYCHIATRIC: The patient is alert and oriented x 3.  SKIN: No obvious rash, lesion, or ulcer.   ORDERS/RESULTS REVIEWED:   CBC  Recent Labs Lab 04/16/16 1124 04/17/16 1730  WBC 8.9  --   HGB 13.3 11.6*  HCT 38.7 34.0*  PLT 374  --   MCV 92.7  --   MCH 32.0  --   MCHC 34.5  --   RDW 13.3  --    ------------------------------------------------------------------------------------------------------------------  Chemistries   Recent Labs Lab 04/16/16 1124 04/17/16 1730  NA 139 143  K 3.3* 3.5  CL 106  --   CO2 27  --   GLUCOSE 106* 104*  BUN 10  --   CREATININE 0.69  --   CALCIUM 9.0  --  AST 18  --   ALT 29  --   ALKPHOS 61  --   BILITOT 1.3*  --    ------------------------------------------------------------------------------------------------------------------ estimated  creatinine clearance is 87.5 mL/min (by C-G formula based on SCr of 0.69 mg/dL). ------------------------------------------------------------------------------------------------------------------ No results for input(s): TSH, T4TOTAL, T3FREE, THYROIDAB in the last 72 hours.  Invalid input(s): FREET3  Cardiac Enzymes No results for input(s): CKMB, TROPONINI, MYOGLOBIN in the last 168 hours.  Invalid input(s): CK ------------------------------------------------------------------------------------------------------------------ Invalid input(s): POCBNP ---------------------------------------------------------------------------------------------------------------  RADIOLOGY: No results found.  EKG: No orders found for this or any previous visit.  ASSESSMENT AND PLAN:  Active Problems:   Nephrolithiasis  #1. Generalized weakness, improved overall, appreciate physical therapist involved recommendations, discharge with home health services  #2. Nephrolithiasis, status post right ureteral stent placement, continue pain management, antispasmodic Ditropan as per urology #3. Hypokalemia, resolved #4. Hyperglycemia, hemoglobin A1c was 5.0, no diabetes  Management plans discussed with the patient, family and they are in agreement.   DRUG ALLERGIES:  Allergies  Allergen Reactions  . Aspirin Shortness Of Breath, Swelling and Anaphylaxis  . Ibuprofen Shortness Of Breath, Swelling and Anaphylaxis    CODE STATUS:     Code Status Orders        Start     Ordered   04/16/16 1541  Full code  Continuous     04/16/16 1542    Code Status History    Date Active Date Inactive Code Status Order ID Comments User Context   This patient has a current code status but no historical code status.      TOTAL TIME TAKING CARE OF THIS PATIENT: 30 minutes.  Discussed this patient's mother, all questions were answered, they voiced understanding  Nicky Milhouse M.D on 04/19/2016 at 6:31 PM  Between  7am to 6pm - Pager - [phone]  After 6pm go to [email or web address] - password [email or web address] [facility]  [facility]  Office  [phone]  CC: Primary care physician; COE, Roberto Scales, MD

## 2016-04-19 NOTE — Progress Notes (Signed)
Discharge instructions reviewed with the patient and her mom.  Pt sent out via her own wheelchair to her moms waiting car

## 2016-04-26 NOTE — Anesthesia Postprocedure Evaluation (Signed)
Anesthesia Post Note  Patient: Christine Chavez  Procedure(s) Performed: Procedure(s) (LRB): CYSTOSCOPY WITH STENT PLACEMENT (Right)  Patient location during evaluation: PACU Anesthesia Type: General Level of consciousness: awake and alert and oriented Pain management: pain level controlled Vital Signs Assessment: post-procedure vital signs reviewed and stable Respiratory status: spontaneous breathing Cardiovascular status: blood pressure returned to baseline Anesthetic complications: no Comments: Some weakness noted in both upper and lower extremities upon arrival to the PACU improved throughout the PACU stay.  Due to the patient's history of  periodic to progressive upper and lower body weakness ( more lower body weakness ), a hospitalist was consulted to evaluate the patient.  Throughout the hospital stay , the patient regained strength.     Last Vitals:  Vitals:   04/19/16 0542 04/19/16 0839  BP: 106/69 100/64  Pulse: 65 67  Resp: 16 16  Temp: 36.8 C 36.6 C    Last Pain:  Vitals:   04/19/16 1728  TempSrc:   PainSc: 3                  Hildreth Robart

## 2016-04-28 ENCOUNTER — Telehealth: Payer: Self-pay | Admitting: Urology

## 2016-04-28 NOTE — Telephone Encounter (Signed)
Left message for patient to return my call.  michelle

## 2016-04-28 NOTE — Telephone Encounter (Signed)
I received a call from Courtney in scheduling about thLavaletteis RUS order that you put in for this patient. Before we schedule her I need something in writing saying that she needs this after her surgery. There is no documentation in her chart any where that this is what you want her to have done prior to her follow up appt on the 24th. I am just covering the basics before we do this. Also dr. Sherryl Bartersbudzyn had ordered a KUB prior to her follow up but this was before she has surgery, does she still need this? Please advise, your consult not from 04-18-16 did not say so I just want to be clear before she has this done.  Thanks,  Marcelino DusterMichelle

## 2016-04-28 NOTE — Telephone Encounter (Signed)
I believe patient still has her urinary stent in place.  The RUS needs to be postponed until the stent has been removed.   The stent was on a tether, so the patient may have removed after discharge.  We need to confirm this with the patient.   If she removed the stent, then we need the RUS to rule out silent hydronephrosis.  If the stent is in place, we need a KUB to evaluate for any stone fragments.

## 2016-05-02 NOTE — Telephone Encounter (Signed)
L/M FOR PT. TO C/B.

## 2016-05-02 NOTE — Telephone Encounter (Signed)
Transferred patient over to scheduling and had them set up her RUS   Christine Chavez

## 2016-05-10 ENCOUNTER — Ambulatory Visit: Payer: 59

## 2016-05-15 ENCOUNTER — Ambulatory Visit
Admission: RE | Admit: 2016-05-15 | Discharge: 2016-05-15 | Disposition: A | Discharge status: Home / Self Care | Payer: 59 | Source: Ambulatory Visit | Attending: Urology | Admitting: Urology

## 2016-05-15 DIAGNOSIS — R109 Unspecified abdominal pain: Secondary | ICD-10-CM | POA: Insufficient documentation

## 2016-05-15 DIAGNOSIS — N200 Calculus of kidney: Secondary | ICD-10-CM | POA: Insufficient documentation

## 2016-05-16 ENCOUNTER — Ambulatory Visit: Payer: 59 | Admitting: Psychiatry

## 2016-05-17 ENCOUNTER — Encounter: Payer: Self-pay | Admitting: Urology

## 2016-05-17 ENCOUNTER — Ambulatory Visit (INDEPENDENT_AMBULATORY_CARE_PROVIDER_SITE_OTHER): Payer: 59 | Admitting: Urology

## 2016-05-17 VITALS — BP 112/79 | HR 80 | Ht 67.0 in | Wt 145.0 lb

## 2016-05-17 DIAGNOSIS — N200 Calculus of kidney: Secondary | ICD-10-CM

## 2016-05-17 DIAGNOSIS — R339 Retention of urine, unspecified: Secondary | ICD-10-CM | POA: Diagnosis not present

## 2016-05-17 NOTE — Progress Notes (Signed)
05/17/2016 10:11 AM   Christine Chavez 1980-10-10 [id number]  Referring provider: Maisie Fusatherine Louw Coe, MD No address on file  Chief Complaint  Patient presents with  . Follow-up    neurogenic bladder    HPI: See last note: The patient's flow symptoms and retention are due to a large capacity hyposensitive bladder. This may or may not be an acute on chronic problem versus an acute Neurology is following her expectantly. She will continue to self catheterize and check residuals when needed. She'll continue to manage her bladder as she is now. The findings on urodynamics and her clinical presentation is out of the  ordinary and likely she has a neurogenic bladder. The role of an alpha-blocker discussed.    Last visit: The patient had a steroid treatment several months ago and now only catheterizes once a day. This is an improvement. If she does check her residual she still thinks it's about 4 ounces. In the last week she's had some right lower abdominal discomfort and right-sided pain. She is not febrile.  Her neurologist thinks she may have multiple sclerosis but is not certain.She is known to have renal stones; she was treated for a UTI in December by myself  Jan 2018: removal of right renal stone and stent (basket)  U/sound and CT scan demonstrate bilateral non-obst stones   Today   PMH: Past Medical History:  Diagnosis Date  . Headache   . Migraines   . Renal disorder   . Vision abnormalities     Surgical History: Past Surgical History:  Procedure Laterality Date  . ANTERIOR CRUCIATE LIGAMENT REPAIR  1997  . CYSTOSCOPY WITH STENT PLACEMENT Right 04/17/2016   Procedure: CYSTOSCOPY WITH STENT PLACEMENT;  Surgeon: Malen GauzePatrick L McKenzie, MD;  Location: [facility] ORS;  Service: Urology;  Laterality: Right;  . EXPLORATORY LAPAROTOMY  1999  . TONSILLECTOMY      Home Medications:  Allergies as of 05/17/2016      Reactions   Aspirin Shortness Of Breath, Swelling, Anaphylaxis   Ibuprofen Shortness Of Breath, Swelling, Anaphylaxis      Medication List       Accurate as of 05/17/16 10:11 AM. Always use your most recent med list.          butalbital-acetaminophen-caffeine 50-325-40 MG tablet Commonly known as:  FIORICET, ESGIC Take 1 tablet by mouth every 6 (six) hours as needed for headache.   mirtazapine 15 MG tablet Commonly known as:  REMERON TAKE 1 TABLET (15 MG TOTAL) BY MOUTH NIGHTLY.   ondansetron 4 MG disintegrating tablet Commonly known as:  ZOFRAN ODT Take 1 tablet (4 mg total) by mouth every 8 (eight) hours as needed for nausea or vomiting.   promethazine 12.5 MG tablet Commonly known as:  PHENERGAN Take 12.5 mg by mouth every 12 (twelve) hours as needed for nausea or vomiting.   rizatriptan 5 MG tablet Commonly known as:  MAXALT Take 5 mg by mouth.       Allergies:  Allergies  Allergen Reactions  . Aspirin Shortness Of Breath, Swelling and Anaphylaxis  . Ibuprofen Shortness Of Breath, Swelling and Anaphylaxis    Family History: Family History  Problem Relation Age of Onset  . Hypertension Mother   . Atrial fibrillation Father   . Healthy Brother   . Arthritis/Rheumatoid Paternal Grandmother   . Healthy Brother     Social History:  reports that she has never smoked. She has never used smokeless tobacco. She reports that she does not drink  alcohol or use drugs.  ROS: UROLOGY Frequent Urination?: No Hard to postpone urination?: No Burning/pain with urination?: No Get up at night to urinate?: No Leakage of urine?: No Urine stream starts and stops?: No Trouble starting stream?: Yes Do you have to strain to urinate?: No Blood in urine?: No Urinary tract infection?: No Sexually transmitted disease?: No Injury to kidneys or bladder?: No Painful intercourse?: No Weak stream?: No Currently pregnant?: No Vaginal bleeding?: No Last menstrual period?: n  Gastrointestinal Nausea?: No Vomiting?:  No Indigestion/heartburn?: No Diarrhea?: No Constipation?: No  Constitutional Fever: No Night sweats?: No Weight loss?: No Fatigue?: No  Skin Skin rash/lesions?: No Itching?: No  Eyes Blurred vision?: No Double vision?: No  Ears/Nose/Throat Sore throat?: No Sinus problems?: No  Hematologic/Lymphatic Swollen glands?: No Easy bruising?: No  Cardiovascular Leg swelling?: No Chest pain?: No  Respiratory Cough?: No Shortness of breath?: No  Endocrine Excessive thirst?: No  Musculoskeletal Back pain?: Yes Joint pain?: No  Neurological Headaches?: No Dizziness?: No  Psychologic Depression?: No Anxiety?: No  Physical Exam: BP 112/79   Pulse 80   Ht 5\' 7"  (1.702 m)   Wt 145 lb (65.8 kg)   LMP 03/26/2016 (Approximate)   BMI 22.71 kg/m     Laboratory Data: Lab Results  Component Value Date   WBC 8.9 04/16/2016   HGB 11.6 (L) 04/17/2016   HCT 34.0 (L) 04/17/2016   MCV 92.7 04/16/2016   PLT 374 04/16/2016    Lab Results  Component Value Date   CREATININE 0.69 04/16/2016    No results found for: PSA  No results found for: TESTOSTERONE  Lab Results  Component Value Date   HGBA1C 5.0 04/18/2016    Urinalysis    Component Value Date/Time   COLORURINE YELLOW (A) 04/16/2016 1124   APPEARANCEUR HAZY (A) 04/16/2016 1124   APPEARANCEUR Cloudy (A) 04/14/2016 1421   LABSPEC 1.026 04/16/2016 1124   PHURINE 6.0 04/16/2016 1124   GLUCOSEU NEGATIVE 04/16/2016 1124   HGBUR LARGE (A) 04/16/2016 1124   BILIRUBINUR NEGATIVE 04/16/2016 1124   BILIRUBINUR Negative 04/14/2016 1421   KETONESUR NEGATIVE 04/16/2016 1124   PROTEINUR 30 (A) 04/16/2016 1124   NITRITE NEGATIVE 04/16/2016 1124   LEUKOCYTESUR NEGATIVE 04/16/2016 1124   LEUKOCYTESUR Negative 04/14/2016 1421    Pertinent Imaging: See Above  Assessment & Plan:  Medically the patient has a stable neurogenic bladder and catheterizes once a day and other 2-3 times a day. She has infrequent  bladder infections and we will treat them when necessary. I mentioned the role of prophylaxis if she starts having frequent urinary tract infections. I will cancel her appointment in June and get another baseline renal ultrasound in 1 year and see her afterwards. Fluid modifications for stone disease was discussed that otherwise I will see her when necessary  There are no diagnoses linked to this encounter.  No Follow-up on file.  Martina Sinner, MD  [facility] [address] [place], [place] [address] [phone]

## 2016-05-23 LAB — STONE ANALYSIS
Ca Oxalate,Dihydrate: 25 %
Ca Oxalate,Monohydr.: 55 %
Ca phos cry stone ql IR: 20 %
Stone Weight KSTONE: 24.9 mg

## 2016-06-15 ENCOUNTER — Encounter: Payer: Self-pay | Admitting: Obstetrics and Gynecology

## 2016-06-15 ENCOUNTER — Ambulatory Visit (INDEPENDENT_AMBULATORY_CARE_PROVIDER_SITE_OTHER): Payer: 59 | Admitting: Obstetrics and Gynecology

## 2016-06-15 VITALS — BP 118/76 | Ht 67.0 in | Wt 140.0 lb

## 2016-06-15 DIAGNOSIS — R1031 Right lower quadrant pain: Secondary | ICD-10-CM

## 2016-06-15 DIAGNOSIS — N921 Excessive and frequent menstruation with irregular cycle: Secondary | ICD-10-CM | POA: Diagnosis not present

## 2016-06-15 NOTE — Progress Notes (Signed)
Obstetrics & Gynecology Office Visit   Chief Complaint:  Chief Complaint  Patient presents with  . Follow-up    pt has r/s'ed appt several times, wanted to follow up on some pain   . Pelvic Pain    History of Present Illness: The patient is a 36 year old female who presents in follow-up from September for right lower quadrant abdominal pain and irregular uterine bleeding. Briefly, she has a history significant for acute onset weakness and lack of coordination. This occurred in November 2016. Around the same time she began having irregular vaginal bleeding that was dark in color and persistent. She also has a history of right lower quadrant pain and dyspareunia. She has been tried on several treatments for her irregular vaginal bleeding without success. She has had a normal pelvic ultrasound, normal Pap smear, and normal endometrial biopsy. She presents today with improvement in her vaginal bleeding with a near normal menses one month ago. Her pain is somewhat improved, as well.  She states that her workup continues from a neurologic standpoint. She has had no routine screening in several years, according to her.   Review of Systems: Review of Systems  Constitutional: Positive for malaise/fatigue.  HENT: Negative.   Eyes: Positive for double vision. Negative for blurred vision.  Respiratory: Negative for cough and wheezing.   Cardiovascular: Negative for chest pain, palpitations and leg swelling.  Gastrointestinal: Positive for abdominal pain. Negative for heartburn, nausea and vomiting.  Genitourinary: Positive for flank pain.  Musculoskeletal:       Generalized weakness and lack of motor coordination (as per her prior visits)  Skin: Negative for rash.  Neurological: Positive for dizziness, focal weakness and weakness.  Endo/Heme/Allergies: Does not bruise/bleed easily.  Psychiatric/Behavioral: Negative for depression and memory loss.   Past Medical History:  Diagnosis Date  .  Headache   . Migraines   . Renal disorder   . Vision abnormalities    Past Surgical History:  Procedure Laterality Date  . ANTERIOR CRUCIATE LIGAMENT REPAIR  1997  . CYSTOSCOPY WITH STENT PLACEMENT Right 04/17/2016   Procedure: CYSTOSCOPY WITH STENT PLACEMENT;  Surgeon: Malen Gauze, MD;  Location: [facility] ORS;  Service: Urology;  Laterality: Right;  . EXPLORATORY LAPAROTOMY  1999  . KIDNEY STONE SURGERY  04/2016  . TONSILLECTOMY      Gynecologic History: Patient's last menstrual period was 05/19/2016.  Obstetric History: [id number]  Family History  Problem Relation Age of Onset  . Hypertension Mother   . Atrial fibrillation Father   . Healthy Brother   . Arthritis/Rheumatoid Paternal Grandmother   . Healthy Brother    Social History   Social History  . Marital status: Married    Spouse name: N/A  . Number of children: N/A  . Years of education: N/A   Occupational History  . Not on file.   Social History Main Topics  . Smoking status: Never Smoker  . Smokeless tobacco: Never Used  . Alcohol use No  . Drug use: No  . Sexual activity: Yes     Comment: vasectomy   Other Topics Concern  . Not on file   Social History Narrative  . No narrative on file   Allergies  Allergen Reactions  . Aspirin Shortness Of Breath, Swelling and Anaphylaxis  . Ibuprofen Shortness Of Breath, Swelling and Anaphylaxis    Medications: Prior to Admission medications   Medication Sig Start Date End Date Taking? Authorizing Provider  ondansetron (ZOFRAN ODT) 4 MG disintegrating tablet Take  1 tablet (4 mg total) by mouth every 8 (eight) hours as needed for nausea or vomiting. 04/12/16   Minna AntisKevin Paduchowski, MD  promethazine (PHENERGAN) 12.5 MG tablet Take 12.5 mg by mouth every 12 (twelve) hours as needed for nausea or vomiting.  06/11/15 04/16/17  Historical Provider, MD    Physical Exam BP 118/76   Ht 5\' 7"  (1.702 m)   Wt 140 lb (63.5 kg)   LMP 05/19/2016   BMI 21.93 kg/m     Patient's last menstrual period was 05/19/2016.  General: NAD HEENT: normocephalic, anicteric Pulmonary: No increased work of breathing  Assessment: 36 y.o. [id number] with right lower quadrant abdominal pain and abnormal uterine bleeding (menorrhagia with irregular menses)  Plan:     Visit Diagnoses    Right lower quadrant abdominal pain    -  Primary * continue to monitor. * recent diagnosis of right nephrureterolithiasis   Menorrhagia with irregular cycle     * Somewhat improved. * will await neurologic workup before pursuing further treatment.     Recommend routine annual care when due.  Discussed having her follow up in 6 months for breast/pelvic exam.  15 minutes spent in face to face discussion with > 50% spent in counseling and management of her right lower quadrant abdominal pain and menorrhagia with irregular cycle.  Thomasene MohairStephen Lakeishia Truluck, MD 06/15/2016 5:18 PM

## 2016-06-21 ENCOUNTER — Ambulatory Visit (INDEPENDENT_AMBULATORY_CARE_PROVIDER_SITE_OTHER): Payer: 59 | Admitting: Psychiatry

## 2016-06-21 DIAGNOSIS — F0631 Mood disorder due to known physiological condition with depressive features: Secondary | ICD-10-CM | POA: Diagnosis not present

## 2016-09-18 ENCOUNTER — Ambulatory Visit: Payer: 59

## 2016-11-20 ENCOUNTER — Ambulatory Visit (INDEPENDENT_AMBULATORY_CARE_PROVIDER_SITE_OTHER): Payer: 59 | Admitting: Obstetrics and Gynecology

## 2016-11-20 ENCOUNTER — Encounter: Payer: Self-pay | Admitting: Obstetrics and Gynecology

## 2016-11-20 VITALS — BP 114/74 | Ht 67.0 in | Wt 145.0 lb

## 2016-11-20 DIAGNOSIS — N649 Disorder of breast, unspecified: Secondary | ICD-10-CM

## 2016-11-20 NOTE — Progress Notes (Signed)
Obstetrics & Gynecology Office Visit   Chief Complaint  Patient presents with  . Breast Pain   History of Present Illness: Breast Mass: Patient presents for evaluation of a breast mass. Change was noted 1 week ago. Patient does routinely do self breast exams.  Patient has noted a change on breast exam. Breast cancer risk factors include none.   Last menstrual period was 10/12/2016. Patient denies hormonal therapy. Patient is G3P3. Patient denies nipple discharge. Patient denies to previous breast biopsy. Patient denies a personal history of breast cancer. She noted a lump under her breast a week ago that was painful.  Several days later she noted a small bump on her upper inner left breast that was raised and red and somewhat painful to touch. Later the one bump turned into three smaller bumps that have gotten smaller.  The redness has also subsided somewhat, as well.   Past Medical History:  Diagnosis Date  . Headache   . Migraines   . Renal disorder   . Vision abnormalities     Past Surgical History:  Procedure Laterality Date  . ANTERIOR CRUCIATE LIGAMENT REPAIR  1997  . CYSTOSCOPY WITH STENT PLACEMENT Right 04/17/2016   Procedure: CYSTOSCOPY WITH STENT PLACEMENT;  Surgeon: Malen Gauze, MD;  Location: [facility] ORS;  Service: Urology;  Laterality: Right;  . EXPLORATORY LAPAROTOMY  1999  . KIDNEY STONE SURGERY  04/2016  . TONSILLECTOMY     Gynecologic History: Patient's last menstrual period was 10/12/2016.  Obstetric History: [id number]  Family History  Problem Relation Age of Onset  . Hypertension Mother   . Atrial fibrillation Father   . Healthy Brother   . Arthritis/Rheumatoid Paternal Grandmother   . Healthy Brother    Social History   Social History  . Marital status: Married    Spouse name: N/A  . Number of children: N/A  . Years of education: N/A   Occupational History  . Not on file.   Social History Main Topics  . Smoking status: Never Smoker  . Smokeless  tobacco: Never Used  . Alcohol use No  . Drug use: No  . Sexual activity: Yes     Comment: vasectomy   Other Topics Concern  . Not on file   Social History Narrative  . No narrative on file   Allergies  Allergen Reactions  . Aspirin Shortness Of Breath, Swelling and Anaphylaxis  . Ibuprofen Shortness Of Breath, Swelling and Anaphylaxis   Prior to Admission medications   Medication Sig Start Date End Date Taking? Authorizing Provider  venlafaxine XR (EFFEXOR-XR) 37.5 MG 24 hr capsule Take 37.5 mg by mouth. 09/21/16 09/21/17 Yes [provider]  ondansetron (ZOFRAN ODT) 4 MG disintegrating tablet Take 1 tablet (4 mg total) by mouth every 8 (eight) hours as needed for nausea or vomiting. Patient not taking: Reported on 11/20/2016 04/12/16   Minna Antis, MD  promethazine (PHENERGAN) 12.5 MG tablet Take 12.5 mg by mouth every 12 (twelve) hours as needed for nausea or vomiting.  06/11/15 04/16/17  [provider]   ROS - see HPI for focused symptoms. No other systemic issues, apart from long-standing neurologic issues.  Physical Exam BP 114/74   Ht 5\' 7"  (1.702 m)   Wt 145 lb (65.8 kg)   LMP 10/12/2016   BMI 22.71 kg/m  Patient's last menstrual period was 10/12/2016. Physical Exam  Constitutional: She is oriented to person, place, and time. She appears well-developed and well-nourished. No distress.  HENT:  Head:  Normocephalic and atraumatic.  Eyes: Conjunctivae are normal. No scleral icterus.  Cardiovascular: Normal rate, regular rhythm and normal heart sounds.   Pulmonary/Chest: Effort normal and breath sounds normal. No respiratory distress. She has no wheezes. She has no rales. Right breast exhibits no inverted nipple, no mass, no nipple discharge, no skin change and no tenderness. Left breast exhibits skin change. Left breast exhibits no inverted nipple, no mass, no nipple discharge and no tenderness.    Abdominal: Soft. She exhibits no distension and no  mass. There is no tenderness. There is no guarding.  Musculoskeletal: Normal range of motion. She exhibits no edema.  Neurological: She is alert and oriented to person, place, and time.  Psychiatric: She has a normal mood and affect. Her behavior is normal. Judgment normal.   Female chaperone present for pelvic and breast  portions of the physical exam  Assessment: 36 y.o. [id number] female with left breast lesions.  Plan: Problem List Items Addressed This Visit    None    Visit Diagnoses    Breast lesion    -  Primary    Continue to monitor as her issue seems to be resolving.  Appears to be either a resolving folliculitis or cellulitis.  She did take amoxicillin for her issue, initially, which was prescribed by another provider (she just finished the course yesterday).  If symptoms worsen, may consider a different antibiotic.  If more concerning features develop, will biopsy skin as this appears superficial.   15 minutes spent in face to face discussion with > 50% spent in counseling and management of her breast lesion.   Thomasene MohairStephen Kier Smead, MD 11/22/2016 11:42 AM

## 2016-12-04 ENCOUNTER — Ambulatory Visit: Payer: 59 | Attending: Neurosurgery | Admitting: Physical Therapy

## 2016-12-04 ENCOUNTER — Encounter: Payer: Self-pay | Admitting: Physical Therapy

## 2016-12-04 DIAGNOSIS — R262 Difficulty in walking, not elsewhere classified: Secondary | ICD-10-CM | POA: Diagnosis present

## 2016-12-04 DIAGNOSIS — M6281 Muscle weakness (generalized): Secondary | ICD-10-CM | POA: Diagnosis present

## 2016-12-04 NOTE — Therapy (Deleted)
[facility] [address] [place], [place], [address] Phone: [phone]   Fax:  [phone]  Physical Therapy Evaluation  Patient Details  Name: Christine Chavez MRN: [id number] Date of Birth: 01-Apr-1981 Referring Provider: Adele Schilder, MD  Encounter Date: 12/04/2016    Past Medical History:  Diagnosis Date  . Headache   . Migraines   . Renal disorder   . Vision abnormalities     Past Surgical History:  Procedure Laterality Date  . ANTERIOR CRUCIATE LIGAMENT REPAIR  1997  . CYSTOSCOPY WITH STENT PLACEMENT Right 04/17/2016   Procedure: CYSTOSCOPY WITH STENT PLACEMENT;  Surgeon: [name], MD;  Location: [facility] ORS;  Service: Urology;  Laterality: Right;  . EXPLORATORY LAPAROTOMY  1999  . KIDNEY STONE SURGERY  04/2016  . TONSILLECTOMY      There were no vitals filed for this visit.       Subjective Assessment - 12/04/16 1039    Subjective Pt. has diagnosis of Asthenia from a new MD.  Pt. known to PT clinic with similar symptoms from last year.  Pt. reports 1/10 R low back pain (?kidney involvement/ stones).        B LE muscle strength grossly 4/5 MMT (generalized weakness noted). Good lumbar/ LE AROM (all planes of movement). 10x SLR on R and L with moderate muscle fasciculations on R as compared to L.  Bridging 10x (min. Fatigue noted).  Ambulates with moderate L antalgic gait pattern with limited step pattern/ no arm swing/ unstable ankle pattern with CGA for safety in gym.  Limited L knee flexion and step pattern.  Pt. Reports no confidence while standing/walking on B feet.       Berg balance test:   LEFS:          Objective measurements completed on examination: See above findings.                       PT Long Term Goals - 03/03/16 1300      PT LONG TERM GOAL #1   Title Pt. I with HEP to increase B LE muscle strength 1/2 muscle grade to improve overall muscle  endurance/ mobility.     Baseline B UE/LE AROM WFL and R UE muscle strength grossly 5/5 MMT except sh. flexion 4+/5 MMT, L UE strength grossly 4/5 MMT.  B LE muscle strength grossly 4/5 MMT except knee flexion 4+/5.    Time 4   Period Weeks   Status Not Met     PT LONG TERM GOAL #2   Title Pt. will increase Berg balance test to >45/56 to improve gait with least restrictive device/ promote decrease fall risk.     Baseline Berg 43/56 on 01/24/16   Time 4   Period Weeks   Status Not Met     PT LONG TERM GOAL #3   Title Pt. will increase LEFS score to >45 out of 80 to improve functional mobility.     Baseline LEFS: 33 out of 80 on 9/18   Time 4   Period Weeks   Status On-going     PT LONG TERM GOAL #4   Title Pt. able to ambulate 10 minutes with appropriate assistive device and more normalized gait pattern safely.     Baseline LImited standing/ walking endurance.  Benefits from rollator with gait around house and w/c in community.     Time 4   Period Weeks  Status Not Met              Patient will benefit from skilled therapeutic intervention in order to improve the following deficits and impairments:     Visit Diagnosis: No diagnosis found.     Problem List Patient Active Problem List   Diagnosis Date Noted  . Nephrolithiasis 04/16/2016  . Numbness 07/28/2015  . Bladder retention 06/23/2015  . Abdominal pain 06/04/2015  . Dizziness 05/18/2015  . Neck pain 05/18/2015  . Complicated migraine 76/15/1834  . Other fatigue 04/28/2015  . Abnormal finding on MRI of brain 04/28/2015  . D (diarrhea) 03/29/2015  . H/O disease 03/29/2015  . Abnormal weight loss 03/29/2015  . Muscle weakness (generalized) 03/14/2015  . Headache, migraine 03/10/2015    [name] 12/04/2016, 10:41 AM   [facility] [address] [place], [place], [address] Phone: [phone]   Fax:  [phone]  Name: Christine Chavez MRN:  [id number] Date of Birth: [birth year]

## 2016-12-04 NOTE — Therapy (Signed)
[facility] [address] [place], [place], [address] Phone: [phone]   Fax:  [phone]  Physical Therapy Evaluation  Patient Details  Name: Christine Chavez MRN: [id number] Date of Birth: [birth year] Referring Provider: Gildardo Cranker  Encounter Date: 12/04/2016      PT End of Session - 12/04/16 1156    Visit Number 1   Number of Visits 8   Date for PT Re-Evaluation 01/01/17   PT Start Time 1027   PT Stop Time 1146   PT Time Calculation (min) 79 min   Equipment Utilized During Treatment Gait belt   Activity Tolerance Patient tolerated treatment well;No increased pain   Behavior During Therapy WFL for tasks assessed/performed      Past Medical History:  Diagnosis Date  . Headache   . Migraines   . Renal disorder   . Vision abnormalities     Past Surgical History:  Procedure Laterality Date  . ANTERIOR CRUCIATE LIGAMENT REPAIR  1997  . CYSTOSCOPY WITH STENT PLACEMENT Right 04/17/2016   Procedure: CYSTOSCOPY WITH STENT PLACEMENT;  Surgeon: Malen Gauze, MD;  Location: [facility] ORS;  Service: Urology;  Laterality: Right;  . EXPLORATORY LAPAROTOMY  1999  . KIDNEY STONE SURGERY  04/2016  . TONSILLECTOMY      There were no vitals filed for this visit.       Subjective Assessment - 12/04/16 1039    Subjective Pt. has diagnosis of Asthenia from a new MD.  Pt. known to PT clinic with similar symptoms from last year.  Pt. reports 1/10 R low back pain (?kidney involvement/ stones). Pt reports no falls but does report some "almost" falls that required sitting back onto couch.     Limitations Lifting;Standing;Walking;House hold activities   Patient Stated Goals ambulate with rollator walker   Currently in Pain? Yes   Pain Score 1    Pain Location Back   Pain Orientation Lower   Pain Descriptors / Indicators Aching   Pain Type Chronic pain   Pain Onset More than a month ago   Pain Frequency Intermittent   Multiple  Pain Sites No         MMT: 4/5 gross BLE  Gait: ataxic gait with significant decrease in stance time on LLE compared to RLE; required use of rollator walker. Pt reports required use of wheelchair for community activities  SLR: Able to perform x10 BLE without LBP; increased fasciculations toward end of exercise with RLE>LLE  BLE AROM: WFL  Berg: 52/56  LEFS: 36/80   Objective measurements completed on examination: See above findings.   TherEx: Supine bridging x10 Sit to stands without UE support x5 3 way hip in // bars x10 BLE. Pt required verbal cueing to maintain correct position Mini squats in // bars x10 Heel raises in // bars x15 Toe raises in // bars x15       PT Education - 12/04/16 1155    Education provided Yes   Education Details mini squats, heel raises, sit to stands, 3 way hip without resistance, supine bridging; safety with HEP (chair/counter)   Person(s) Educated Patient   Methods Explanation;Handout;Demonstration   Comprehension Verbalized understanding;Returned demonstration             PT Long Term Goals - 12/04/16 1159      PT LONG TERM GOAL #1   Title Pt will report ability to ambulate in the community without the use of a wheelchair to allow patient to participate  in desired activities   Baseline requires wheelchair   Time 4   Period Weeks   Status New     PT LONG TERM GOAL #2   Title Pt. will increase Berg balance test to >55/56 to improve gait with least restrictive device/ promote decrease fall risk.     Baseline Berg 52/56 on 12/04/16   Time 4   Period Weeks   Status New     PT LONG TERM GOAL #3   Title Pt. will increase LEFS score to >45 out of 80 to improve functional mobility.     Baseline LEFS: 36 out of 80 on 12/04/16   Time 4   Period Weeks   Status New     PT LONG TERM GOAL #4   Title Pt will report ability to work in her yard for greater than 30 minutes without extreme fatigue   Baseline limited to 5-10 minutes  before pt requires ending activity   Time 4   Period Weeks   Status New            Plan - 12/04/16 1206    Clinical Impression Statement Pt age 36 reports to PT with B LE weakness and requires use of rollator walker for ambulation. Pt demonstrates 4/5 gross BLE MMT with increased muscle fasciculations with strenuous activity with RLE>LLE. Pt demonstrates ataxic gait with significantly decreased stance time on LLE compared to RLE. Pt demonstrated increased fasciculations t/o tx session and demonstrated a continued decrease in gait speed as the tx session continued. Pt will benefit from skilled PT in order to increase BLE MMT and muscle endurance in order to allow pt to participate in desired activities.   Clinical Presentation Stable   Clinical Decision Making Moderate   Rehab Potential Fair   PT Frequency 2x / week   PT Duration 4 weeks   PT Treatment/Interventions ADLs/Self Care Home Management;Gait training;Stair training;Functional mobility training;Therapeutic activities;Therapeutic exercise;Balance training;Patient/family education;Neuromuscular re-education;Manual techniques;Wheelchair mobility training;Passive range of motion;Energy conservation   PT Next Visit Plan re-assess HEP   PT Home Exercise Plan see pt education section   Consulted and Agree with Plan of Care Patient      Patient will benefit from skilled therapeutic intervention in order to improve the following deficits and impairments:  Abnormal gait, Improper body mechanics, Pain, Postural dysfunction, Decreased mobility, Decreased coordination, Decreased endurance, Decreased activity tolerance, Decreased range of motion, Decreased strength, Decreased safety awareness, Decreased balance, Difficulty walking  Visit Diagnosis: Muscle weakness (generalized)  Difficulty in walking, not elsewhere classified     Problem List Patient Active Problem List   Diagnosis Date Noted  . Nephrolithiasis 04/16/2016  . Numbness  07/28/2015  . Bladder retention 06/23/2015  . Abdominal pain 06/04/2015  . Dizziness 05/18/2015  . Neck pain 05/18/2015  . Complicated migraine 04/28/2015  . Other fatigue 04/28/2015  . Abnormal finding on MRI of brain 04/28/2015  . D (diarrhea) 03/29/2015  . H/O disease 03/29/2015  . Abnormal weight loss 03/29/2015  . Muscle weakness (generalized) 03/14/2015  . Headache, migraine 03/10/2015   Cammie Mcgee, PT, DPT # [id number] Nickola Major, SPT 12/05/2016, 1:50 PM  [place] [address] [place], [place], [address] Phone: [phone]   Fax:  [phone]  Name: Christine Chavez MRN: [id number] Date of Birth: 1981-03-13

## 2016-12-06 ENCOUNTER — Encounter: Payer: Self-pay | Admitting: Physical Therapy

## 2016-12-06 ENCOUNTER — Ambulatory Visit: Payer: 59 | Admitting: Physical Therapy

## 2016-12-06 DIAGNOSIS — R262 Difficulty in walking, not elsewhere classified: Secondary | ICD-10-CM

## 2016-12-06 DIAGNOSIS — M6281 Muscle weakness (generalized): Secondary | ICD-10-CM | POA: Diagnosis not present

## 2016-12-07 NOTE — Therapy (Signed)
[facility] [address] [place], [place], [address] Phone: [phone]   Fax:  [phone]  Physical Therapy Treatment  Patient Details  Name: Christine Chavez MRN: [id number] Date of Birth: February 15, 1981 Referring Provider: Gildardo Cranker  Encounter Date: 12/06/2016      PT End of Session - 12/07/16 2032    Visit Number 2   Number of Visits 8   Date for PT Re-Evaluation 01/01/17   PT Start Time 0945   PT Stop Time 1041   PT Time Calculation (min) 56 min   Equipment Utilized During Treatment Gait belt   Activity Tolerance Patient tolerated treatment well;No increased pain   Behavior During Therapy WFL for tasks assessed/performed      Past Medical History:  Diagnosis Date  . Headache   . Migraines   . Renal disorder   . Vision abnormalities     Past Surgical History:  Procedure Laterality Date  . ANTERIOR CRUCIATE LIGAMENT REPAIR  1997  . CYSTOSCOPY WITH STENT PLACEMENT Right 04/17/2016   Procedure: CYSTOSCOPY WITH STENT PLACEMENT;  Surgeon: Malen Gauze, MD;  Location: [facility] ORS;  Service: Urology;  Laterality: Right;  . EXPLORATORY LAPAROTOMY  1999  . KIDNEY STONE SURGERY  04/2016  . TONSILLECTOMY      There were no vitals filed for this visit.      Subjective Assessment - 12/07/16 2031    Subjective Pt. states she is doing okay.  Pt. was really tired in B LE after last tx.  Pt. entered PT with use of rollator and moderate antalgic gait pattern.    Limitations Lifting;Standing;Walking;House hold activities   Patient Stated Goals ambulate with rollator walker   Currently in Pain? No/denies      There.ex.:   Seated/ standing hip flexion/ abd./ knee flexion/ heel raises 10x2 in //-bars. Step ups/ downs in //-bars. Scifit L6 10 min. B UE/LE.  Quadruped/ tall kneeling tasks on blue mat table (good technique with R UE/L LE).   Reviewed HEP  Neuro:  Walking in gym without use of rollator in //-bars/  clinic working on consistent step pattern/ proper BOS/ heel strike [employer] Step ups/ downs [facility] touches L/R ([employer] use).   Sit to stands 10x (varying heights).        PT Long Term Goals - 12/04/16 1159      PT LONG TERM GOAL #1   Title Pt will report ability to ambulate in the community without the use of a wheelchair to allow patient to participate in desired activities   Baseline requires wheelchair   Time 4   Period Weeks   Status New     PT LONG TERM GOAL #2   Title Pt. will increase Berg balance test to >55/56 to improve gait with least restrictive device/ promote decrease fall risk.     Baseline Berg 52/56 on 12/04/16   Time 4   Period Weeks   Status New     PT LONG TERM GOAL #3   Title Pt. will increase LEFS score to >45 out of 80 to improve functional mobility.     Baseline LEFS: 36 out of 80 on 12/04/16   Time 4   Period Weeks   Status New     PT LONG TERM GOAL #4   Title Pt will report ability to work in her yard for greater than 30 minutes without extreme fatigue   Baseline limited to 5-10 minutes before pt requires ending activity  Time 4   Period Weeks   Status New               Plan - 12/07/16 2033    Clinical Impression Statement Pt. able to obtain quadruped position on blue mat table and raise R UE/L LE with minimal difficulty but unable to alt. L UE/R LE without LOB.  Limited R hip/knee flexion during gait pattern and has heavy use of B UE with rollator.  Moderate B LE muscle fatigue and increase muscle fasciculations as tx. progresses.  Good technique with LE proprioceptive cone taps in //-bars.     Clinical Presentation Stable   Clinical Decision Making Moderate   Rehab Potential Fair   PT Frequency 2x / week   PT Duration 4 weeks   PT Treatment/Interventions ADLs/Self Care Home Management;Gait training;Stair training;Functional mobility training;Therapeutic activities;Therapeutic exercise;Balance training;Patient/family education;Neuromuscular  re-education;Manual techniques;Wheelchair mobility training;Passive range of motion;Energy conservation   PT Next Visit Plan Progress HEP/ issue proprioceptive ex.    PT Home Exercise Plan see pt education section   Consulted and Agree with Plan of Care Patient      Patient will benefit from skilled therapeutic intervention in order to improve the following deficits and impairments:  Abnormal gait, Improper body mechanics, Pain, Postural dysfunction, Decreased mobility, Decreased coordination, Decreased endurance, Decreased activity tolerance, Decreased range of motion, Decreased strength, Decreased safety awareness, Decreased balance, Difficulty walking  Visit Diagnosis: Muscle weakness (generalized)  Difficulty in walking, not elsewhere classified     Problem List Patient Active Problem List   Diagnosis Date Noted  . Nephrolithiasis 04/16/2016  . Numbness 07/28/2015  . Bladder retention 06/23/2015  . Abdominal pain 06/04/2015  . Dizziness 05/18/2015  . Neck pain 05/18/2015  . Complicated migraine 04/28/2015  . Other fatigue 04/28/2015  . Abnormal finding on MRI of brain 04/28/2015  . D (diarrhea) 03/29/2015  . H/O disease 03/29/2015  . Abnormal weight loss 03/29/2015  . Muscle weakness (generalized) 03/14/2015  . Headache, migraine 03/10/2015   Cammie Mcgee, PT, DPT # [id number] 12/07/2016, 8:37 PM  [place] [facility] [address] [place], [place], [address] Phone: [phone]   Fax:  [phone]  Name: Malene Blaydes MRN: [id number] Date of Birth: 26-Nov-1980

## 2016-12-11 ENCOUNTER — Encounter: Payer: Self-pay | Admitting: Physical Therapy

## 2016-12-12 ENCOUNTER — Encounter: Payer: Self-pay | Admitting: Physical Therapy

## 2016-12-13 ENCOUNTER — Encounter: Payer: Self-pay | Admitting: Physical Therapy

## 2016-12-13 ENCOUNTER — Ambulatory Visit: Payer: 59 | Admitting: Physical Therapy

## 2016-12-13 DIAGNOSIS — M6281 Muscle weakness (generalized): Secondary | ICD-10-CM

## 2016-12-13 DIAGNOSIS — R262 Difficulty in walking, not elsewhere classified: Secondary | ICD-10-CM

## 2016-12-13 NOTE — Therapy (Signed)
[place] [facility] [address] [place], [place], [address] Phone: [phone]   Fax:  [phone]  Physical Therapy Treatment  Patient Details  Name: Christine Chavez MRN: [id number] Date of Birth: 24-Mar-1981 Referring Provider: Gildardo CrankerBabu Subramaniam  Encounter Date: 12/13/2016      PT End of Session - 12/13/16 0858    Visit Number 3   Number of Visits 8   Date for PT Re-Evaluation 01/01/17   PT Start Time 0859   PT Stop Time 0955   PT Time Calculation (min) 56 min   Equipment Utilized During Treatment Gait belt   Activity Tolerance Patient tolerated treatment well;No increased pain   Behavior During Therapy WFL for tasks assessed/performed      Past Medical History:  Diagnosis Date  . Headache   . Migraines   . Renal disorder   . Vision abnormalities     Past Surgical History:  Procedure Laterality Date  . ANTERIOR CRUCIATE LIGAMENT REPAIR  1997  . CYSTOSCOPY WITH STENT PLACEMENT Right 04/17/2016   Procedure: CYSTOSCOPY WITH STENT PLACEMENT;  Surgeon: Malen GauzePatrick L McKenzie, MD;  Location: [facility]S;  Service: Urology;  Laterality: Right;  . EXPLORATORY LAPAROTOMY  1999  . KIDNEY STONE SURGERY  04/2016  . TONSILLECTOMY      There were no vitals filed for this visit.      Subjective Assessment - 12/13/16 0858    Subjective Pt. entered PT with use of rollator.  No new issues.  Pt. states she remains limited with B LE muscle endurance/ limited by muscle fasciculations.  Pt. will use w/c at church due to distance between youth group and church service.     Limitations Lifting;Standing;Walking;House hold activities   Patient Stated Goals ambulate with rollator walker   Currently in Pain? No/denies        There.ex.:   Seated/ standing hip flexion/ abd./ knee flexion/ heel and toe raises 20x in //-bars.  BankerMirror feedback. Walking forward/lateral/backwards in //-bars with no UE assist. Standing at shelving unit: fine motor with  nuts and bolts. Scifit L6 10 min. B UE/LE.  No charge at end of tx. Session.    Neuro:  Walking in gym without use of rollator in //-bars/ clinic working on consistent step pattern/ proper BOS/ heel strike.  Cuing to increase cadence.  R ankle instability.   Recip. Step ups/downs with CGA and verbal cuing.  Sit to stands 10x (varying heights).  Gait training:  Pt. Properly sized for use of SPC and instructed in 2-point gait pattern with recip. Pattern. Pt. Ambulates with good technique but slow, antalgic gait pattern with moderate B ankle instability/ muscle fasciculations noted.   Pt. Unable to increase cadence with use of SPC and PT instructed pt. To work [facility] but continue to use rollator with more community based [employer]ambulation/ church.           PT Long Term Goals - 12/04/16 1159      PT LONG TERM GOAL #1   Title Pt will report ability to ambulate in the community without the use of a wheelchair to allow patient to participate in desired activities   Baseline requires wheelchair   Time 4   Period Weeks   Status New     PT LONG TERM GOAL #2   Title Pt. will increase Berg balance test to >55/56 to improve gait with least restrictive device/ promote decrease fall risk.     Baseline Christine MottsBerg  52/56 on 12/04/16   Time 4   Period Weeks   Status New     PT LONG TERM GOAL #3   Title Pt. will increase LEFS score to >45 out of 80 to improve functional mobility.     Baseline LEFS: 36 out of 80 on 12/04/16   Time 4   Period Weeks   Status New     PT LONG TERM GOAL #4   Title Pt will report ability to work in her yard for greater than 30 minutes without extreme fatigue   Baseline limited to 5-10 minutes before pt requires ending activity   Time 4   Period Weeks   Status New            Plan - 12/13/16 1135    Clinical Impression Statement Pt. had 2 episodes of braiding/ LOB while walking without use of assistive device and able to self-correct.  Moderate B ankle  (R>L) instability during wt. bearing/ walking/ balance tasks.  Pt. unable to increase gait cadence and tends to over focus on each step taken.  Moderate B LE/ generalized muscle fatigue as tx. progresses.  Pt. works hard during tx. session and demonstrate 2-point gait pattern with use of SPC in clinic.  No change to HEP at this time.     Clinical Presentation Stable   Clinical Decision Making Moderate   Rehab Potential Fair   PT Frequency 2x / week   PT Duration 4 weeks   PT Treatment/Interventions ADLs/Self Care Home Management;Gait training;Stair training;Functional mobility training;Therapeutic activities;Therapeutic exercise;Balance training;Patient/family education;Neuromuscular re-education;Manual techniques;Wheelchair mobility training;Passive range of motion;Energy conservation   PT Next Visit Plan Progress HEP/ issue proprioceptive ex.  Discuss use of SPC   PT Home Exercise Plan see pt education section   Consulted and Agree with Plan of Care Patient      Patient will benefit from skilled therapeutic intervention in order to improve the following deficits and impairments:  Abnormal gait, Improper body mechanics, Pain, Postural dysfunction, Decreased mobility, Decreased coordination, Decreased endurance, Decreased activity tolerance, Decreased range of motion, Decreased strength, Decreased safety awareness, Decreased balance, Difficulty walking  Visit Diagnosis: Muscle weakness (generalized)  Difficulty in walking, not elsewhere classified     Problem List Patient Active Problem List   Diagnosis Date Noted  . Nephrolithiasis 04/16/2016  . Numbness 07/28/2015  . Bladder retention 06/23/2015  . Abdominal pain 06/04/2015  . Dizziness 05/18/2015  . Neck pain 05/18/2015  . Complicated migraine 04/28/2015  . Other fatigue 04/28/2015  . Abnormal finding on MRI of brain 04/28/2015  . D (diarrhea) 03/29/2015  . H/O disease 03/29/2015  . Abnormal weight loss 03/29/2015  . Muscle  weakness (generalized) 03/14/2015  . Headache, migraine 03/10/2015   Cammie Mcgee, PT, DPT # [id number] 12/13/2016, 11:45 AM  [facility] [address] [place], [place], [address] Phone: [phone]   Fax:  [phone]  Name: Deandra Goering MRN: [id number] Date of Birth: 27-Dec-1980

## 2016-12-20 ENCOUNTER — Ambulatory Visit: Payer: 59 | Attending: Neurosurgery | Admitting: Physical Therapy

## 2016-12-20 DIAGNOSIS — M6281 Muscle weakness (generalized): Secondary | ICD-10-CM | POA: Insufficient documentation

## 2016-12-20 DIAGNOSIS — R262 Difficulty in walking, not elsewhere classified: Secondary | ICD-10-CM | POA: Diagnosis present

## 2016-12-21 ENCOUNTER — Encounter: Payer: Self-pay | Admitting: Physical Therapy

## 2016-12-21 NOTE — Therapy (Signed)
[place] [address] [place], [place], [address] Phone: [phone]   Fax:  [phone]  Physical Therapy Treatment  Patient Details  Name: Christine Chavez MRN: [id number] Date of Birth: Feb 02, 1981 Referring Provider: Gildardo Cranker  Encounter Date: 12/20/2016      PT End of Session - 12/21/16 1717    Visit Number 4   Number of Visits 8   Date for PT Re-Evaluation 01/01/17   PT Start Time 0852   PT Stop Time 0949   PT Time Calculation (min) 57 min   Equipment Utilized During Treatment Gait belt   Activity Tolerance Patient tolerated treatment well;No increased pain   Behavior During Therapy WFL for tasks assessed/performed      Past Medical History:  Diagnosis Date  . Headache   . Migraines   . Renal disorder   . Vision abnormalities     Past Surgical History:  Procedure Laterality Date  . ANTERIOR CRUCIATE LIGAMENT REPAIR  1997  . CYSTOSCOPY WITH STENT PLACEMENT Right 04/17/2016   Procedure: CYSTOSCOPY WITH STENT PLACEMENT;  Surgeon: Malen Gauze, MD;  Location: [facility];  Service: Urology;  Laterality: Right;  . EXPLORATORY LAPAROTOMY  1999  . KIDNEY STONE SURGERY  04/2016  . TONSILLECTOMY      There were no vitals filed for this visit.      Subjective Assessment - 12/21/16 1709    Subjective Pt. states she is tired today.  Pt. didn't go to church this weekend secondary to R low back pain (?kidney issues).     Limitations Lifting;Standing;Walking;House hold activities   Patient Stated Goals ambulate with rollator walker   Currently in Pain? Yes   Pain Score 2    Pain Location Back   Pain Orientation Right;Lower   Pain Descriptors / Indicators Aching       Prone STM to lumbar paraspinals to assess back pain (no tenderness).  Grade III PA mobs. To low thoracic/lumbar paraspinals (good mobility)- unable to reproduce pain symptoms.    There.ex.:   Seated/ standing hip flexion/ abd./ knee  flexion/ heel and toe raises 20x in //-bars.  Banker. Walking forward/lateral/backwards in //-bars with no UE assist. Walking lunges in //-bars with short static holds and UE assist for safety.   Scifit L6 10 min. B UE/LE.  No charge at end of tx. Session.    Neuro:  Walking in gym without use of rollator in //-bars/ clinic working on consistent step pattern/ proper BOS/ heel strike.  Cuing to increase cadence.  R ankle instability.   Recip. Step ups/downs with CGA and verbal cuing.  Sit to stands 10x (varying heights). BOSU partial lunges/ step ups on L/R with B UE assist in //-bars.         PT Long Term Goals - 12/04/16 1159      PT LONG TERM GOAL #1   Title Pt will report ability to ambulate in the community without the use of a wheelchair to allow patient to participate in desired activities   Baseline requires wheelchair   Time 4   Period Weeks   Status New     PT LONG TERM GOAL #2   Title Pt. will increase Berg balance test to >55/56 to improve gait with least restrictive device/ promote decrease fall risk.     Baseline Berg 52/56 on 12/04/16   Time 4   Period Weeks   Status New     PT LONG TERM GOAL #  3   Title Pt. will increase LEFS score to >45 out of 80 to improve functional mobility.     Baseline LEFS: 36 out of 80 on 12/04/16   Time 4   Period Weeks   Status New     PT LONG TERM GOAL #4   Title Pt will report ability to work in her yard for greater than 30 minutes without extreme fatigue   Baseline limited to 5-10 minutes before pt requires ending activity   Time 4   Period Weeks   Status New            Plan - 12/21/16 1720    Clinical Impression Statement Tx. progression limited by R low back/ kidney issues.  Pt. planning on calling Urologist to discuss recent exacerbation of low back pain.  No back tenderness.  Good lumbar ROM and mobility with PA grade III mobs. in prone position.  Pt. ambulates with continued slow antalgic gait pattern  with inconsistent step pattern/ LE muscle control.  Moderate LE muscle fatigue and several seated rest breaks required.     Clinical Presentation Stable   Clinical Decision Making Moderate   Rehab Potential Fair   PT Frequency 2x / week   PT Duration 4 weeks   PT Treatment/Interventions ADLs/Self Care Home Management;Gait training;Stair training;Functional mobility training;Therapeutic activities;Therapeutic exercise;Balance training;Patient/family education;Neuromuscular re-education;Manual techniques;Wheelchair mobility training;Passive range of motion;Energy conservation   PT Next Visit Plan Progress HEP/ issue proprioceptive ex.  Discuss use of SPC   PT Home Exercise Plan see pt education section   Consulted and Agree with Plan of Care Patient      Patient will benefit from skilled therapeutic intervention in order to improve the following deficits and impairments:  Abnormal gait, Improper body mechanics, Pain, Postural dysfunction, Decreased mobility, Decreased coordination, Decreased endurance, Decreased activity tolerance, Decreased range of motion, Decreased strength, Decreased safety awareness, Decreased balance, Difficulty walking  Visit Diagnosis: Muscle weakness (generalized)  Difficulty in walking, not elsewhere classified     Problem List Patient Active Problem List   Diagnosis Date Noted  . Nephrolithiasis 04/16/2016  . Numbness 07/28/2015  . Bladder retention 06/23/2015  . Abdominal pain 06/04/2015  . Dizziness 05/18/2015  . Neck pain 05/18/2015  . Complicated migraine 04/28/2015  . Other fatigue 04/28/2015  . Abnormal finding on MRI of brain 04/28/2015  . D (diarrhea) 03/29/2015  . H/O disease 03/29/2015  . Abnormal weight loss 03/29/2015  . Muscle weakness (generalized) 03/14/2015  . Headache, migraine 03/10/2015   Cammie McgeeMichael C Vandora Jaskulski, PT, DPT # [id number] 12/21/2016, 5:27 PM  [facility] [address] [place],  [place], [address] Phone: [phone]   Fax:  [phone]  Name: Porfirio MylarMelissa Dawn Dunnigan MRN: [id number] Date of Birth: Sep 05, 1980

## 2016-12-22 ENCOUNTER — Ambulatory Visit: Payer: 59 | Admitting: Physical Therapy

## 2016-12-22 ENCOUNTER — Encounter: Payer: Self-pay | Admitting: Physical Therapy

## 2016-12-22 DIAGNOSIS — M6281 Muscle weakness (generalized): Secondary | ICD-10-CM

## 2016-12-22 DIAGNOSIS — R262 Difficulty in walking, not elsewhere classified: Secondary | ICD-10-CM

## 2016-12-22 NOTE — Therapy (Signed)
[facility] [address] [place], [place], [address] Phone: [phone]   Fax:  [phone]  Physical Therapy Treatment  Patient Details  Name: Christine Chavez MRN: [id number] Date of Birth: Sep 23, 1980 Referring Provider: Gildardo CrankerBabu Subramaniam  Encounter Date: 12/22/2016   Treatment of 5 of 8.   End of cert 6/96/299/17/18     Past Medical History:  Diagnosis Date  . Headache   . Migraines   . Renal disorder   . Vision abnormalities     Past Surgical History:  Procedure Laterality Date  . ANTERIOR CRUCIATE LIGAMENT REPAIR  1997  . CYSTOSCOPY WITH STENT PLACEMENT Right 04/17/2016   Procedure: CYSTOSCOPY WITH STENT PLACEMENT;  Surgeon: Malen GauzePatrick L McKenzie, MD;  Location: [facility]S;  Service: Urology;  Laterality: Right;  . EXPLORATORY LAPAROTOMY  1999  . KIDNEY STONE SURGERY  04/2016  . TONSILLECTOMY      There were no vitals filed for this visit.    Pt. continues to have difficult time with R low back/ ?kidney.  Pt. states her legs were really tired after last PT appt. and fatigued today.        There.ex.:   Seated/ standing hip flexion/ abd./ knee flexion/ heel and toeraises 20xin //-bars. BankerMirror feedback. Walking forward/lateral/backwards in //-bars with no UE assist. Scifit L6 10 min. B UE/LE. No charge at end of tx. Session.   Neuro:  Walking in gym without use of rollator in //-bars/ clinic working on consistent step pattern/ proper BOS/ heel strike. Cuing to increase cadence. R ankle instability. Left rollator in car today (CGA from PT). Recip. Step ups/downs with CGA and verbal cuing.  Sit to stands 10x (varying heights). Cone taps (varying heights on 6" step with color coding. Airex/ plinth step ups/ overs with no UE assist.          PT focus on static/dynamic balance tasks challenging LE stability and proprioception.  Pt. requires max focus and extra time to complete cone tap ex. in //-bars without  UE assist.  Significant ankle/quad muscle fasciculations during standing ther.ex./ balance and gait training.  No increase c/o back pain during tx. and pt. ambulates into PT clinic from car with CGA and no assistive device.          PT Long Term Goals - 12/04/16 1159      PT LONG TERM GOAL #1   Title Pt will report ability to ambulate in the community without the use of a wheelchair to allow patient to participate in desired activities   Baseline requires wheelchair   Time 4   Period Weeks   Status New     PT LONG TERM GOAL #2   Title Pt. will increase Berg balance test to >55/56 to improve gait with least restrictive device/ promote decrease fall risk.     Baseline Berg 52/56 on 12/04/16   Time 4   Period Weeks   Status New     PT LONG TERM GOAL #3   Title Pt. will increase LEFS score to >45 out of 80 to improve functional mobility.     Baseline LEFS: 36 out of 80 on 12/04/16   Time 4   Period Weeks   Status New     PT LONG TERM GOAL #4   Title Pt will report ability to work in her yard for greater than 30 minutes without extreme fatigue   Baseline limited to 5-10 minutes before pt requires ending activity  Time 4   Period Weeks   Status New             Patient will benefit from skilled therapeutic intervention in order to improve the following deficits and impairments:  Abnormal gait, Improper body mechanics, Pain, Postural dysfunction, Decreased mobility, Decreased coordination, Decreased endurance, Decreased activity tolerance, Decreased range of motion, Decreased strength, Decreased safety awareness, Decreased balance, Difficulty walking  Visit Diagnosis: Muscle weakness (generalized)  Difficulty in walking, not elsewhere classified     Problem List Patient Active Problem List   Diagnosis Date Noted  . Nephrolithiasis 04/16/2016  . Numbness 07/28/2015  . Bladder retention 06/23/2015  . Abdominal pain 06/04/2015  . Dizziness 05/18/2015  . Neck pain  05/18/2015  . Complicated migraine 04/28/2015  . Other fatigue 04/28/2015  . Abnormal finding on MRI of brain 04/28/2015  . D (diarrhea) 03/29/2015  . H/O disease 03/29/2015  . Abnormal weight loss 03/29/2015  . Muscle weakness (generalized) 03/14/2015  . Headache, migraine 03/10/2015   Cammie Mcgee, PT, DPT # [id number] 12/23/2016, 6:41 PM  [place] [facility] [address] [place], [place], [address] Phone: [phone]   Fax:  [phone]  Name: Christine Chavez MRN: [id number] Date of Birth: [birth year]

## 2016-12-25 ENCOUNTER — Ambulatory Visit: Payer: 59 | Admitting: Physical Therapy

## 2016-12-25 DIAGNOSIS — M6281 Muscle weakness (generalized): Secondary | ICD-10-CM

## 2016-12-25 DIAGNOSIS — R262 Difficulty in walking, not elsewhere classified: Secondary | ICD-10-CM

## 2016-12-25 NOTE — Therapy (Signed)
[place] [facility] [address] [place], [place], [address] Phone: [phone]   Fax:  [phone]  Physical Therapy Treatment  Patient Details  Name: Christine Chavez MRN: [id number] Date of Birth: 1981-03-06 Referring Provider: Gildardo CrankerBabu Subramaniam  Encounter Date: 12/25/2016      Christine Chavez End of Session - 12/25/16 1250    Visit Number 6   Number of Visits 8   Date for Christine Chavez Re-Evaluation 01/01/17   Christine Chavez Start Time 0901   Christine Chavez Stop Time 0959   Christine Chavez Time Calculation (min) 58 min   Equipment Utilized During Treatment Gait belt   Activity Tolerance Patient tolerated treatment well;No increased pain   Behavior During Therapy WFL for tasks assessed/performed      Past Medical History:  Diagnosis Date  . Headache   . Migraines   . Renal disorder   . Vision abnormalities     Past Surgical History:  Procedure Laterality Date  . ANTERIOR CRUCIATE LIGAMENT REPAIR  1997  . CYSTOSCOPY WITH STENT PLACEMENT Right 04/17/2016   Procedure: CYSTOSCOPY WITH STENT PLACEMENT;  Surgeon: Malen GauzePatrick L McKenzie, MD;  Location: [facility]S;  Service: Urology;  Laterality: Right;  . EXPLORATORY LAPAROTOMY  1999  . KIDNEY STONE SURGERY  04/2016  . TONSILLECTOMY      There were no vitals filed for this visit.      Subjective Assessment - 12/25/16 0904    Subjective Christine Chavez. states she is still dealing with R kidney issue.  No new complaints/ no falls.  Christine Chavez. was able to go to church yesterday but felt nauseous.    Limitations Lifting;Standing;Walking;House hold activities   Patient Stated Goals ambulate with rollator walker   Currently in Pain? Yes   Pain Score 3    Pain Location Back   Pain Orientation Right;Lower   Pain Descriptors / Indicators Aching      There.ex.:   Seated/ standing hip flexion/ abd./ knee flexion/ heel and toeraises 20xin //-bars. BankerMirror feedback. Walking forward/lateral/backwards in //-bars with no UE assist. Supine knee to chest with white  ball/ bridging with white ball 10x2 each.  Attempted knee to chest during bridge (difficulty).   Scifit L6 10 min. B UE/LE. No charge at end of tx. Session.    Neuro:  Walking in gym without use of rollator in //-bars/ clinic working on consistent step pattern/ proper BOS/ heel strike. Cuing to increase cadence. R ankle instability. Left rollator in car today (CGA from Christine Chavez). Recip. Step ups/downs with CGA and verbal cuing.   Reverse BOSU:  Static/dynamic standing tasks and wt. Shifting L/R (CGA for safety).   Obstacle course in hallway: cone touches/ maneuvering cones/ figure 8.  (moderate LE fatigue).    MH to low back in seated posture after tx. While waiting for ride.        Christine Chavez Long Term Goals - 12/04/16 1159      Christine Chavez LONG TERM GOAL #1   Title Christine Chavez will report ability to ambulate in the community without the use of a wheelchair to allow patient to participate in desired activities   Baseline requires wheelchair   Time 4   Period Weeks   Status New     Christine Chavez LONG TERM GOAL #2   Title Christine Chavez. will increase Berg balance test to >55/56 to improve gait with least restrictive device/ promote decrease fall risk.     Baseline Berg 52/56 on 12/04/16   Time 4   Period Weeks   Status New  Christine Chavez LONG TERM GOAL #3   Title Christine Chavez. will increase LEFS score to >45 out of 80 to improve functional mobility.     Baseline LEFS: 36 out of 80 on 12/04/16   Time 4   Period Weeks   Status New     Christine Chavez LONG TERM GOAL #4   Title Christine Chavez will report ability to work in her yard for greater than 30 minutes without extreme fatigue   Baseline limited to 5-10 minutes before Christine Chavez requires ending activity   Time 4   Period Weeks   Status New             Plan - 12/25/16 1251    Clinical Impression Statement Moderate R LE muscle fasciculations with repeated step ups/ BOSU wt. shifting.  CGA with all dynamic standing/ balance tasks.  Christine Chavez. requires extra time/ focus with gait pattern in Christine Chavez clinic without use of  rollator.  Moderate cuing to increase cadence/ step pattern/ heel strike and avoid lateral sway of trunk.       Clinical Presentation Stable   Clinical Decision Making Moderate   Rehab Potential Fair   Christine Chavez Frequency 2x / week   Christine Chavez Duration 4 weeks   Christine Chavez Treatment/Interventions ADLs/Self Care Home Management;Gait training;Stair training;Functional mobility training;Therapeutic activities;Therapeutic exercise;Balance training;Patient/family education;Neuromuscular re-education;Manual techniques;Wheelchair mobility training;Passive range of motion;Energy conservation   Christine Chavez Next Visit Plan Progress HEP/ issue proprioceptive ex.    Christine Chavez Home Exercise Plan see Christine Chavez education section   Consulted and Agree with Plan of Care Patient      Patient will benefit from skilled therapeutic intervention in order to improve the following deficits and impairments:  Abnormal gait, Improper body mechanics, Pain, Postural dysfunction, Decreased mobility, Decreased coordination, Decreased endurance, Decreased activity tolerance, Decreased range of motion, Decreased strength, Decreased safety awareness, Decreased balance, Difficulty walking  Visit Diagnosis: Muscle weakness (generalized)  Difficulty in walking, not elsewhere classified     Problem List Patient Active Problem List   Diagnosis Date Noted  . Nephrolithiasis 04/16/2016  . Numbness 07/28/2015  . Bladder retention 06/23/2015  . Abdominal pain 06/04/2015  . Dizziness 05/18/2015  . Neck pain 05/18/2015  . Complicated migraine 04/28/2015  . Other fatigue 04/28/2015  . Abnormal finding on MRI of brain 04/28/2015  . D (diarrhea) 03/29/2015  . H/O disease 03/29/2015  . Abnormal weight loss 03/29/2015  . Muscle weakness (generalized) 03/14/2015  . Headache, migraine 03/10/2015   Christine Chavez, Christine Chavez, Christine Chavez 12/25/2016, 12:55 PM  [place] [address] [place], [place], [address] Phone:  [phone]   Fax:  [phone]  Name: Christine Chavez MRN: [id number] Date of Birth: May 28, 1980

## 2016-12-27 ENCOUNTER — Ambulatory Visit: Payer: 59 | Admitting: Physical Therapy

## 2016-12-27 DIAGNOSIS — R262 Difficulty in walking, not elsewhere classified: Secondary | ICD-10-CM

## 2016-12-27 DIAGNOSIS — M6281 Muscle weakness (generalized): Secondary | ICD-10-CM | POA: Diagnosis not present

## 2016-12-29 NOTE — Therapy (Signed)
[facility] [address] [place], [place], [address] Phone: [phone]   Fax:  [phone]  Physical Therapy Treatment  Patient Details  Name: Christine Chavez MRN: [id number] Date of Birth: 11/07/1980 Referring Provider: Gildardo Cranker  Encounter Date: 12/27/2016      PT End of Session - 12/28/16 2359    Visit Number 7   Number of Visits 8   Date for PT Re-Evaluation 01/01/17   PT Start Time 0909   PT Stop Time 1001   PT Time Calculation (min) 52 min   Equipment Utilized During Treatment Gait belt   Activity Tolerance Patient tolerated treatment well;No increased pain   Behavior During Therapy WFL for tasks assessed/performed      Past Medical History:  Diagnosis Date  . Headache   . Migraines   . Renal disorder   . Vision abnormalities     Past Surgical History:  Procedure Laterality Date  . ANTERIOR CRUCIATE LIGAMENT REPAIR  1997  . CYSTOSCOPY WITH STENT PLACEMENT Right 04/17/2016   Procedure: CYSTOSCOPY WITH STENT PLACEMENT;  Surgeon: Malen Gauze, MD;  Location: [facility]S;  Service: Urology;  Laterality: Right;  . EXPLORATORY LAPAROTOMY  1999  . KIDNEY STONE SURGERY  04/2016  . TONSILLECTOMY      There were no vitals filed for this visit.      Subjective Assessment - 12/28/16 2358    Limitations Lifting;Standing;Walking;House hold activities   Patient Stated Goals ambulate with rollator walker   Currently in Pain? Yes   Pain Score 2    Pain Location Back   Pain Orientation Right;Lower   Pain Descriptors / Indicators Aching   Pain Type Chronic pain                                      PT Long Term Goals - 12/04/16 1159      PT LONG TERM GOAL #1   Title Pt will report ability to ambulate in the community without the use of a wheelchair to allow patient to participate in desired activities   Baseline requires wheelchair   Time 4   Period Weeks   Status New     PT LONG TERM GOAL #2   Title Pt. will increase Berg balance test to >55/56 to improve gait with least restrictive device/ promote decrease fall risk.     Baseline Berg 52/56 on 12/04/16   Time 4   Period Weeks   Status New     PT LONG TERM GOAL #3   Title Pt. will increase LEFS score to >45 out of 80 to improve functional mobility.     Baseline LEFS: 36 out of 80 on 12/04/16   Time 4   Period Weeks   Status New     PT LONG TERM GOAL #4   Title Pt will report ability to work in her yard for greater than 30 minutes without extreme fatigue   Baseline limited to 5-10 minutes before pt requires ending activity   Time 4   Period Weeks   Status New               Plan - 12/29/16 0000    Clinical Presentation Stable   Clinical Decision Making Moderate   Rehab Potential Fair   PT Frequency 2x / week   PT Duration 4 weeks   PT Treatment/Interventions ADLs/Self Care  Home Management;Gait training;Stair training;Functional mobility training;Therapeutic activities;Therapeutic exercise;Balance training;Patient/family education;Neuromuscular re-education;Manual techniques;Wheelchair mobility training;Passive range of motion;Energy conservation   PT Next Visit Plan Progress HEP/ issue proprioceptive ex.    PT Home Exercise Plan see pt education section   Consulted and Agree with Plan of Care Patient      Patient will benefit from skilled therapeutic intervention in order to improve the following deficits and impairments:  Abnormal gait, Improper body mechanics, Pain, Postural dysfunction, Decreased mobility, Decreased coordination, Decreased endurance, Decreased activity tolerance, Decreased range of motion, Decreased strength, Decreased safety awareness, Decreased balance, Difficulty walking  Visit Diagnosis: Muscle weakness (generalized)  Difficulty in walking, not elsewhere classified     Problem List Patient Active Problem List   Diagnosis Date Noted  . Nephrolithiasis  04/16/2016  . Numbness 07/28/2015  . Bladder retention 06/23/2015  . Abdominal pain 06/04/2015  . Dizziness 05/18/2015  . Neck pain 05/18/2015  . Complicated migraine 04/28/2015  . Other fatigue 04/28/2015  . Abnormal finding on MRI of brain 04/28/2015  . D (diarrhea) 03/29/2015  . H/O disease 03/29/2015  . Abnormal weight loss 03/29/2015  . Muscle weakness (generalized) 03/14/2015  . Headache, migraine 03/10/2015    Cammie Mcgee 12/29/2016, 12:01 AM  [facility] [address] [place], [place], [address] Phone: [phone]   Fax:  [phone]  Name: Mamta Rimmer MRN: [id number] Date of Birth: 12/30/1980

## 2017-01-01 ENCOUNTER — Encounter: Payer: 59 | Admitting: Physical Therapy

## 2017-01-02 ENCOUNTER — Encounter: Payer: Self-pay | Admitting: Urology

## 2017-01-02 ENCOUNTER — Ambulatory Visit (INDEPENDENT_AMBULATORY_CARE_PROVIDER_SITE_OTHER): Payer: 59 | Admitting: Urology

## 2017-01-02 VITALS — BP 113/80 | HR 102 | Ht 67.0 in | Wt 143.2 lb

## 2017-01-02 DIAGNOSIS — R109 Unspecified abdominal pain: Secondary | ICD-10-CM | POA: Diagnosis not present

## 2017-01-02 MED ORDER — PROMETHAZINE HCL 25 MG RE SUPP: 25.0000 mg | Freq: Four times a day (QID) | RECTAL | 0 refills | Status: DC | PRN

## 2017-01-02 MED ORDER — HYDROMORPHONE HCL 2 MG PO TABS: 2.0000 mg | ORAL_TABLET | Freq: Four times a day (QID) | ORAL | 0 refills | Status: DC | PRN

## 2017-01-02 NOTE — Progress Notes (Signed)
01/02/2017 3:30 PM   Christine Chavez [birth year] [id number]  Referring provider: Maisie Fus, MD [address] [place], [place] [address]  Chief Complaint  Patient presents with  . Flank Pain    HPI: (see notes prior) The patient's flow symptoms and retention are due to a large capacity hyposensitive bladder. This may or may not be an acute on chronic problem versus an acute. Neurology is following her expectantly. She will continue to self catheterize and check residuals when needed. She'll continue to manage her bladder as she is now. The findings on urodynamics and her clinical presentation is out of the  ordinary and likely she has a neurogenic bladder.     The patient had a steroid treatment several months ago and now only catheterizes once a day. This is an improvement. If she does check her residual she still thinks it's about 4 ounces. In the last week she's had some right lower abdominal discomfort and right-sided pain. She is not febrile.  Her neurologist thinks she may have multiple sclerosis but is not certain.She is known to have renal stones; she was treated for a UTI in December by myself  Jan 2018: removal of right renal stone and stent (basket)  U/sound and CT scan demonstrate bilateral non-obst stones    Last visit she was catheterizing once sometimes 2 or 3 times a day. We did not start prophylaxis for urinary tract infections yet. Surveillance renal ultrasound once per year was discussed. Last ultrasound was January 2018  TODAY The patient catheterizes 1-3 times per week. Her flow is reasonable. Sometimes she has trouble to start her stream and can stop and start. She has not had a bladder infection recently  Her primary complaint is right flank discomfort off and on for a few weeks. She has felt nauseated in the last few days. She's had no fever. Urine has been a little bit dark.  Her neurologist does not think she is multiple  sclerosis and may have had a viral immune response affecting the nervous system. She can walk some but has some durability issues  Modifying factors: There are no other modifying factors  Associated signs and symptoms: There are no other associated signs and symptoms Aggravating and relieving factors: There are no other aggravating or relieving factors Severity: Moderate Duration: Persistent    PMH: Past Medical History:  Diagnosis Date  . Headache   . Migraines   . Renal disorder   . Vision abnormalities     Surgical History: Past Surgical History:  Procedure Laterality Date  . ANTERIOR CRUCIATE LIGAMENT REPAIR  1997  . CYSTOSCOPY WITH STENT PLACEMENT Right 04/17/2016   Procedure: CYSTOSCOPY WITH STENT PLACEMENT;  Surgeon: Malen Gauze, MD;  Location: [facility] ORS;  Service: Urology;  Laterality: Right;  . EXPLORATORY LAPAROTOMY  1999  . KIDNEY STONE SURGERY  04/2016  . TONSILLECTOMY      Home Medications:  Allergies as of 01/02/2017      Reactions   Aspirin Shortness Of Breath, Swelling, Anaphylaxis   Ibuprofen Shortness Of Breath, Swelling, Anaphylaxis      Medication List       Accurate as of 01/02/17  3:30 PM. Always use your most recent med list.          butalbital-acetaminophen-caffeine 50-325-40 MG tablet Commonly known as:  FIORICET, ESGIC Take by mouth.   ondansetron 4 MG disintegrating tablet Commonly known as:  ZOFRAN ODT Take 1 tablet (4 mg total)  by mouth every 8 (eight) hours as needed for nausea or vomiting.   promethazine 12.5 MG tablet Commonly known as:  PHENERGAN Take 12.5 mg by mouth every 12 (twelve) hours as needed for nausea or vomiting.   rizatriptan 5 MG tablet Commonly known as:  MAXALT Take 5 mg by mouth.   venlafaxine XR 37.5 MG 24 hr capsule Commonly known as:  EFFEXOR-XR Take 37.5 mg by mouth.            Discharge Care Instructions        Start     Ordered   01/02/17 0000  Urinalysis, Complete     01/02/17 1520        Allergies:  Allergies  Allergen Reactions  . Aspirin Shortness Of Breath, Swelling and Anaphylaxis  . Ibuprofen Shortness Of Breath, Swelling and Anaphylaxis    Family History: Family History  Problem Relation Age of Onset  . Hypertension Mother   . Atrial fibrillation Father   . Healthy Brother   . Arthritis/Rheumatoid Paternal Grandmother   . Healthy Brother     Social History:  reports that she has never smoked. She has never used smokeless tobacco. She reports that she does not drink alcohol or use drugs.  ROS:                                        Physical Exam: BP 113/80 (BP Location: Left Arm, Patient Position: Sitting, Cuff Size: Normal)   Pulse (!) 102   Ht  (1.702 m)   Wt 143 lb 3.2 oz (65 kg)   BMI 22.43 kg/m   Constitutional:  Alert and oriented, No acute distress.  Laboratory Data: Lab Results  Component Value Date   WBC 8.9 04/16/2016   HGB 11.6 (L) 04/17/2016   HCT 34.0 (L) 04/17/2016   MCV 92.7 04/16/2016   PLT 374 04/16/2016    Lab Results  Component Value Date   CREATININE 0.69 04/16/2016    No results found for: PSA  No results found for: TESTOSTERONE  Lab Results  Component Value Date   HGBA1C 5.0 04/18/2016    Urinalysis    Component Value Date/Time   COLORURINE YELLOW (A) 04/16/2016 1124   APPEARANCEUR HAZY (A) 04/16/2016 1124   APPEARANCEUR Cloudy (A) 04/14/2016 1421   LABSPEC 1.026 04/16/2016 1124   PHURINE 6.0 04/16/2016 1124   GLUCOSEU NEGATIVE 04/16/2016 1124   HGBUR LARGE (A) 04/16/2016 1124   BILIRUBINUR NEGATIVE 04/16/2016 1124   BILIRUBINUR Negative 04/14/2016 1421   KETONESUR NEGATIVE 04/16/2016 1124   PROTEINUR 30 (A) 04/16/2016 1124   NITRITE NEGATIVE 04/16/2016 1124   LEUKOCYTESUR NEGATIVE 04/16/2016 1124   LEUKOCYTESUR Negative 04/14/2016 1421    Pertinent Imaging: none  Assessment & Plan:  The patient in my opinion should have a CT scan soon and urine sent for  culture. It would be nice to do this tomorrow morning and to call her with the triaged results. Carollee Herter will call the patient tomorrow. I gave her 30 dilaudid and 20 Phenergan. She had Flomax at home.  1. Flank pain  - Urinalysis, Complete   No Follow-up on file.  Martina Sinner, MD  [facility] [address], [address] [place], [place] [address] [phone]

## 2017-01-03 ENCOUNTER — Ambulatory Visit: Payer: 59 | Admitting: Physical Therapy

## 2017-01-03 DIAGNOSIS — M6281 Muscle weakness (generalized): Secondary | ICD-10-CM

## 2017-01-03 DIAGNOSIS — R262 Difficulty in walking, not elsewhere classified: Secondary | ICD-10-CM

## 2017-01-03 LAB — URINALYSIS, COMPLETE
Bilirubin, UA: NEGATIVE
Glucose, UA: NEGATIVE
Leukocytes, UA: NEGATIVE
Nitrite, UA: NEGATIVE
Specific Gravity, UA: >1.03 — ABNORMAL HIGH (ref 1.005–1.030)
Urobilinogen, Ur: 1 mg/dL (ref 0.2–1.0)
pH, UA: 6 (ref 5.0–7.5)

## 2017-01-03 LAB — MICROSCOPIC EXAMINATION
Bacteria, UA: NONE SEEN
RBC, UA: >30 /hpf — ABNORMAL HIGH (ref 0–?)
WBC, UA: NONE SEEN /hpf (ref 0–?)

## 2017-01-03 NOTE — Therapy (Signed)
[facility] [address] [place], [place], [address] Phone: [phone]   Fax:  [phone]  Physical Therapy Treatment  Patient Details  Name: Christine Chavez MRN: [id number] Date of Birth: 1980-06-06 Referring Provider: Thomasene Ripple  Encounter Date: 01/03/2017      PT End of Session - 01/07/17 1828    Visit Number 8   Number of Visits 15   Date for PT Re-Evaluation 01/31/17   PT Start Time 0902   PT Stop Time 0951   PT Time Calculation (min) 49 min   Equipment Utilized During Treatment Gait belt   Activity Tolerance Patient tolerated treatment well;No increased pain;Patient limited by fatigue   Behavior During Therapy [facility] for tasks assessed/performed      Past Medical History:  Diagnosis Date  . Headache   . Migraines   . Renal disorder   . Vision abnormalities     Past Surgical History:  Procedure Laterality Date  . ANTERIOR CRUCIATE LIGAMENT REPAIR  1997  . CYSTOSCOPY WITH STENT PLACEMENT Right 04/17/2016   Procedure: CYSTOSCOPY WITH STENT PLACEMENT;  Surgeon: [name], MD;  Location: [facility];  Service: Urology;  Laterality: Right;  . EXPLORATORY LAPAROTOMY  1999  . KIDNEY STONE SURGERY  04/2016  . TONSILLECTOMY      There were no vitals filed for this visit.      Subjective Assessment - 01/07/17 1826    Subjective Pt. reports 6/10 R low back/kidney pain.  Pt. states she was in bed most of day yesterday.     Limitations Lifting;Standing;Walking;House hold activities   Patient Stated Goals ambulate with rollator walker   Currently in Pain? Yes   Pain Score 6    Pain Location Back   Pain Orientation Right;Lower     There.ex.:  Scifit L5 10 min.   Standing hip ex./ sit to stands with no UE assist (extra time/ attempts required)- low back pain persistent  Seated ball ex.: wt. Shifting/ marching/ LAQ/ sit to stands with min. UE assist from //-bars.   Neuro:  Walking in clinic with no  assistive device working on maintaining consistent BOS/ upright posture/ step pattern/ heel strike. Standing wt. Shifting/ Airex pad activity Step ups/ over 3" and 6" plinths.  No Berg reassessment today due to high levels of low back pain.        Tx limited by significant back/ kidney pain.  Pt. Is waiting for renal stone CT to be scheduled.          PT Long Term Goals - 01/07/17 1844      PT LONG TERM GOAL #1   Title Pt will report ability to ambulate in the community without the use of a wheelchair to allow patient to participate in desired activities   Baseline requires wheelchair   Time 4   Period Weeks   Status Not Met     PT LONG TERM GOAL #2   Title Pt. will increase Berg balance test to >55/56 to improve gait with least restrictive device/ promote decrease fall risk.     Baseline Berg 52/56 on 12/04/16   Time 4   Period Weeks   Status On-going     PT LONG TERM GOAL #3   Title Pt. will increase LEFS score to >45 out of 80 to improve functional mobility.     Baseline LEFS: 36 out of 80 on 12/04/16.  LEFS: 36 out of 80.  No change.   Time  4   Period Weeks   Status Not Met     PT LONG TERM GOAL #4   Title Pt will report ability to work in her yard for greater than 30 minutes without extreme fatigue   Baseline limited to 5-10 minutes before pt requires ending activity   Time 4   Period Weeks   Status Not Met            Plan - 01/07/17 1830    Clinical Impression Statement Pt. has shown limited progress over past 2 weeks secondary to low back pain/ kidney symptoms.  Pt. has f/u with Urologist scheduled.  Pt. has significant B LE muscle fatigue/ muscle fasciculations during ther.ex./ standing ex.  Pt. benefits from use of rollator with all standing/walking.  No falls reported over past month.  Grip strength: L 46#/ R 40#.  Limited progress with PT goals at this time.  Pt. will benefit from continued skilled PT services to increase B LE muscle strength to improve  mobility/ safety.     Clinical Presentation Stable   Clinical Decision Making Moderate   Rehab Potential Fair   PT Frequency 2x / week   PT Duration 4 weeks   PT Treatment/Interventions ADLs/Self Care Home Management;Gait training;Stair training;Functional mobility training;Therapeutic activities;Therapeutic exercise;Balance training;Patient/family education;Neuromuscular re-education;Manual techniques;Wheelchair mobility training;Passive range of motion;Energy conservation   PT Next Visit Plan Progress HEP/ issue proprioceptive ex.    PT Home Exercise Plan see pt education section      Patient will benefit from skilled therapeutic intervention in order to improve the following deficits and impairments:  Abnormal gait, Improper body mechanics, Pain, Postural dysfunction, Decreased mobility, Decreased coordination, Decreased endurance, Decreased activity tolerance, Decreased range of motion, Decreased strength, Decreased safety awareness, Decreased balance, Difficulty walking  Visit Diagnosis: Muscle weakness (generalized)  Difficulty in walking, not elsewhere classified     Problem List Patient Active Problem List   Diagnosis Date Noted  . Nephrolithiasis 04/16/2016  . Numbness 07/28/2015  . Bladder retention 06/23/2015  . Abdominal pain 06/04/2015  . Dizziness 05/18/2015  . Neck pain 05/18/2015  . Complicated migraine 82/95/6213  . Other fatigue 04/28/2015  . Abnormal finding on MRI of brain 04/28/2015  . D (diarrhea) 03/29/2015  . H/O disease 03/29/2015  . Abnormal weight loss 03/29/2015  . Muscle weakness (generalized) 03/14/2015  . Headache, migraine 03/10/2015   [name], PT, DPT # [id number] 01/07/2017, 6:47 PM  [facility] [name] [place], [place], [address] Phone: [phone]   Fax:  [phone]  Name: Christine Chavez MRN: [id number] Date of Birth: 12-15-1980

## 2017-01-04 ENCOUNTER — Telehealth: Payer: Self-pay | Admitting: Urology

## 2017-01-04 ENCOUNTER — Ambulatory Visit
Admission: RE | Admit: 2017-01-04 | Discharge: 2017-01-04 | Disposition: A | Discharge status: Home / Self Care | Payer: 59 | Source: Ambulatory Visit | Attending: Urology | Admitting: Urology

## 2017-01-04 DIAGNOSIS — R109 Unspecified abdominal pain: Secondary | ICD-10-CM | POA: Diagnosis present

## 2017-01-04 DIAGNOSIS — N200 Calculus of kidney: Secondary | ICD-10-CM | POA: Diagnosis not present

## 2017-01-04 NOTE — Telephone Encounter (Signed)
Would you let Christine Chavez know that her CT scan did not demonstrate any stones blocking her kidneys.  She does have stones in her right kidney, but they typically do not cause pain if they are in the kidney and not blocking the kidney.    Her urine culture is still pending at this time.

## 2017-01-04 NOTE — Telephone Encounter (Signed)
Spoke with patient and gave Results. Patient wants to know if she should go GYN route or what is her next step for her pain. Patient states she is a pretty tough girl and this is getting her. I let her know that when her culture results come back that Carollee Herter would have read this message and give her some steps as to what is next when she gives you the culture results. Patient states ok with plan.

## 2017-01-05 LAB — CULTURE, URINE COMPREHENSIVE

## 2017-01-05 NOTE — Telephone Encounter (Signed)
Patient called the office at 4:40pm today requesting urine culture results. I advised her that I would send Michiel Cowboy a message to see if she would review the urine culture and advise.    If a prescription is needed, she would like the [facility] in [place], [place]. She can be reached at [phone].

## 2017-01-08 NOTE — Telephone Encounter (Signed)
Please let Christine Chavez know that her urine culture is positive.  We need to start Augmentin 875/125, one tablet twice daily for seven days.

## 2017-01-09 ENCOUNTER — Telehealth: Payer: Self-pay

## 2017-01-09 ENCOUNTER — Ambulatory Visit: Payer: 59 | Admitting: Physical Therapy

## 2017-01-09 DIAGNOSIS — N390 Urinary tract infection, site not specified: Secondary | ICD-10-CM

## 2017-01-09 MED ORDER — AMOXICILLIN-POT CLAVULANATE 875-125 MG PO TABS: 1.0000 | ORAL_TABLET | Freq: Two times a day (BID) | ORAL | 0 refills | Status: AC

## 2017-01-09 NOTE — Telephone Encounter (Signed)
Pt called and left a message on triage nurse line requesting ucx results. Spoke with pt and made aware augmentin was sent to pharmacy. Pt voiced understanding.

## 2017-01-11 ENCOUNTER — Ambulatory Visit: Payer: 59 | Admitting: Physical Therapy

## 2017-01-15 ENCOUNTER — Telehealth: Payer: Self-pay

## 2017-01-15 ENCOUNTER — Ambulatory Visit (INDEPENDENT_AMBULATORY_CARE_PROVIDER_SITE_OTHER): Payer: Managed Care, Other (non HMO) | Admitting: Obstetrics and Gynecology

## 2017-01-15 ENCOUNTER — Encounter: Payer: Self-pay | Admitting: Obstetrics and Gynecology

## 2017-01-15 VITALS — BP 114/74 | Ht 67.0 in | Wt 142.0 lb

## 2017-01-15 DIAGNOSIS — Z01419 Encounter for gynecological examination (general) (routine) without abnormal findings: Secondary | ICD-10-CM

## 2017-01-15 DIAGNOSIS — Z1331 Encounter for screening for depression: Secondary | ICD-10-CM

## 2017-01-15 DIAGNOSIS — Z1339 Encounter for screening examination for other mental health and behavioral disorders: Secondary | ICD-10-CM

## 2017-01-15 MED ORDER — NITROFURANTOIN MONOHYD MACRO 100 MG PO CAPS: 100.0000 mg | ORAL_CAPSULE | Freq: Two times a day (BID) | ORAL | 0 refills | Status: DC

## 2017-01-15 NOTE — Telephone Encounter (Signed)
Spoke to patient. Gave results. Verified pharmacy on file.

## 2017-01-15 NOTE — Progress Notes (Signed)
Gynecology Annual Exam  PCP: Maisie Fus, MD  Chief Complaint  Patient presents with  . Annual Exam   History of Present Illness:  [name] is a 36 y.o. [id number] who LMP was Patient's last menstrual period was 12/23/2016., presents today for her annual examination.  Her menses are mostly normal occurring most months, lasting for a short duration (a couple of days with red blood in the first day or so, then darker spotting for a few days).  Last Pap: 1 year ago  Results were: no abnormalities /neg HPV DNA negative Sexually active; contraception  = vasectomy Hx of STDs: none  Last mammogram: none There is no FH of breast cancer. There is no FH of ovarian cancer. The patient does do self-breast exams. Still tender in left breast, but spot from two months ago has resolved.  Tobacco use: The patient denies current or previous tobacco use. Alcohol use: none Exercise: in Physical therapy  The patient wears seatbelts: yes.      Past Medical History:  Diagnosis Date  . Headache   . Migraines   . Renal disorder   . Vision abnormalities     Past Surgical History:  Procedure Laterality Date  . ANTERIOR CRUCIATE LIGAMENT REPAIR  1997  . CYSTOSCOPY WITH STENT PLACEMENT Right 04/17/2016   Procedure: CYSTOSCOPY WITH STENT PLACEMENT;  Surgeon: Malen Gauze, MD;  Location: [facility] ORS;  Service: Urology;  Laterality: Right;  . EXPLORATORY LAPAROTOMY  1999  . KIDNEY STONE SURGERY  04/2016  . TONSILLECTOMY      Prior to Admission medications   Medication Sig Start Date End Date Taking? Authorizing Provider  amoxicillin-clavulanate (AUGMENTIN) 875-125 MG tablet Take 1 tablet by mouth 2 (two) times daily. 01/09/17 01/16/17  Michiel Cowboy A, PA-C  butalbital-acetaminophen-caffeine (FIORICET, ESGIC) 50-325-40 MG tablet Take by mouth. 09/21/16   [provider]  HYDROmorphone (DILAUDID) 2 MG tablet Take 1 tablet (2 mg total) by mouth every 6 (six) hours as needed  for severe pain. 01/02/17   MacDiarmid, Lorin Picket, MD  ondansetron (ZOFRAN ODT) 4 MG disintegrating tablet Take 1 tablet (4 mg total) by mouth every 8 (eight) hours as needed for nausea or vomiting. Patient not taking: Reported on 11/20/2016 04/12/16   Minna Antis, MD  promethazine (PHENERGAN) 25 MG suppository Place 1 suppository (25 mg total) rectally every 6 (six) hours as needed for nausea or vomiting. 01/02/17   Alfredo Martinez, MD  rizatriptan (MAXALT) 5 MG tablet Take 5 mg by mouth. 09/21/16   [provider]  venlafaxine XR (EFFEXOR-XR) 37.5 MG 24 hr capsule Take 37.5 mg by mouth. 09/21/16 09/21/17  [provider]    Allergies  Allergen Reactions  . Aspirin Shortness Of Breath, Swelling and Anaphylaxis  . Ibuprofen Shortness Of Breath, Swelling and Anaphylaxis    Gynecologic History: Patient's last menstrual period was 12/23/2016.  Obstetric History: [id number]  Social History   Social History  . Marital status: Married    Spouse name: N/A  . Number of children: N/A  . Years of education: N/A   Occupational History  . Not on file.   Social History Main Topics  . Smoking status: Never Smoker  . Smokeless tobacco: Never Used  . Alcohol use No  . Drug use: No  . Sexual activity: Yes     Comment: vasectomy   Other Topics Concern  . Not on file   Social History Narrative  . No narrative on file    Family  History  Problem Relation Age of Onset  . Hypertension Mother   . Atrial fibrillation Father   . Healthy Brother   . Arthritis/Rheumatoid Paternal Grandmother   . Healthy Brother     Review of Systems  Constitutional: Negative for chills, diaphoresis, fever, malaise/fatigue and weight loss.  HENT: Negative.   Eyes: Negative.   Respiratory: Negative.   Cardiovascular: Negative.   Gastrointestinal: Positive for abdominal pain (back pain from recent kidney infection). Negative for blood in stool, constipation, diarrhea, heartburn, melena, nausea  and vomiting.  Genitourinary: Negative.   Musculoskeletal: Negative.   Skin: Negative.   Neurological: Positive for weakness (unchanged from prior).  Psychiatric/Behavioral: Negative.      Physical Exam BP 114/74   Ht  (1.702 m)   Wt 142 lb (64.4 kg)   LMP 12/23/2016 Comment: pt signed preg form  BMI 22.24 kg/m    Physical Exam  Constitutional: She is oriented to person, place, and time. She appears well-developed and well-nourished. No distress.  Eyes: EOM are normal. No scleral icterus.  Neck: Normal range of motion. Neck supple.  Cardiovascular: Normal rate and regular rhythm.   Pulmonary/Chest: Effort normal and breath sounds normal. No respiratory distress. She has no wheezes. She has no rales.  Abdominal: Soft. Bowel sounds are normal. She exhibits no distension and no mass. There is no tenderness. There is no rebound and no guarding.  Musculoskeletal: Normal range of motion. She exhibits no edema.  Neurological: She is alert and oriented to person, place, and time. No cranial nerve deficit.  In wheelchair, as before  Skin: Skin is warm and dry. No erythema.  Psychiatric: She has a normal mood and affect. Her behavior is normal. Judgment normal.    Female chaperone present for pelvic and breast  portions of the physical exam  Results: AUDIT Questionnaire (screen for alcoholism): 0 PHQ-9: 9   Assessment: 36 y.o. [id number] female here for routine annual gynecologic examination.  Plan: Problem List Items Addressed This Visit    None    Visit Diagnoses    Women's annual routine gynecological examination    -  Primary   Screening for depression       Screening for alcohol problem          Screening: -- Blood pressure screen normal -- Colonoscopy - not due -- Mammogram - not due -- Weight screening: normal -- Depression screening negative (PHQ-9) -- Nutrition: normal -- cholesterol screening: not due for screening -- osteoporosis screening: not due --  tobacco screening: not using -- alcohol screening: AUDIT questionnaire indicates low-risk usage. -- family history of breast cancer screening: done. not at high risk. -- no evidence of domestic violence or intimate partner violence. -- STD screening: gonorrhea/chlamydia NAAT not collected per patient request. -- pap smear not collected per ASCCP guidelines -- HPV vaccination series: not eligilbe  Thomasene Mohair, MD 01/15/2017 9:49 AM

## 2017-01-15 NOTE — Telephone Encounter (Signed)
-----   Message from Alfredo Martinez, MD sent at 01/10/2017  2:29 PM EDT ----- Macrodantin 100 mg bid for 7 days    ----- Message ----- From: Ellin Goodie, CMA Sent: 01/05/2017   3:02 PM To: Alfredo Martinez, MD    ----- Message ----- From: Interface, [facility] Lab Results In Sent: 01/03/2017   9:40 AM To: Jennette Kettle Clinical

## 2017-01-17 ENCOUNTER — Ambulatory Visit: Payer: 59 | Attending: Neurosurgery | Admitting: Physical Therapy

## 2017-01-17 DIAGNOSIS — M6281 Muscle weakness (generalized): Secondary | ICD-10-CM | POA: Diagnosis not present

## 2017-01-17 DIAGNOSIS — R262 Difficulty in walking, not elsewhere classified: Secondary | ICD-10-CM | POA: Diagnosis present

## 2017-01-19 ENCOUNTER — Telehealth: Payer: Self-pay

## 2017-01-19 DIAGNOSIS — N390 Urinary tract infection, site not specified: Secondary | ICD-10-CM

## 2017-01-19 MED ORDER — PROMETHAZINE HCL 12.5 MG PO TABS: 12.5000 mg | ORAL_TABLET | ORAL | 0 refills | Status: DC | PRN

## 2017-01-19 NOTE — Telephone Encounter (Signed)
Pt called stating Dr. Sherron Monday had given her phenergan suppositories and she is not able to insert them due to a MS flare. Pt requested tablets. Per Dr. Sherryl Barters phenergan tablets were sent to pt pharmacy. Pt voiced understanding.

## 2017-01-21 NOTE — Therapy (Addendum)
[facility] [address] [place], [place], [address] Phone: [phone]   Fax:  [phone]  Physical Therapy Treatment  Patient Details  Name: Christine Chavez MRN: [id number] Date of Birth: February 18, 1981 Referring Provider: Dr. Luevenia Maxin  Encounter Date: 01/17/2017      PT End of Session - 01/21/17 1633    Visit Number 9   Number of Visits 15   Date for PT Re-Evaluation 01/31/17   PT Start Time 4010   PT Stop Time 1525   PT Time Calculation (min) 56 min   Equipment Utilized During Treatment Gait belt   Activity Tolerance Patient tolerated treatment well;No increased pain;Patient limited by fatigue   Behavior During Therapy [facility] for tasks assessed/performed      Past Medical History:  Diagnosis Date  . Headache   . Migraines   . Renal disorder   . Vision abnormalities     Past Surgical History:  Procedure Laterality Date  . ANTERIOR CRUCIATE LIGAMENT REPAIR  1997  . CYSTOSCOPY WITH STENT PLACEMENT Right 04/17/2016   Procedure: CYSTOSCOPY WITH STENT PLACEMENT;  Surgeon: [name], MD;  Location: [facility] ORS;  Service: Urology;  Laterality: Right;  . EXPLORATORY LAPAROTOMY  1999  . KIDNEY STONE SURGERY  04/2016  . TONSILLECTOMY      There were no vitals filed for this visit.    Pt. states she is still feeling bad.  Pt. on a second antibiotic for UTI and has been really sedentary over past week.          There.ex.:  Scifit L5 10 min. B UE/LE (no rest breaks but fatigue noted).  No increase c/o pain.   Standing hip ex./ sit to stands with no UE assist (extra time/ attempts required). Supine marching/ hip abd./ SAQ/ SLR/ bridging/ trunk rotn. 20x each.  Supine LE stretches (generalized hip/hamstring stretches).      Neuro:  Walking in clinic with use of rollator and no assistive device (CGA for safety) working on maintaining consistent BOS/ upright posture/ step pattern/ heel strike. Standing wt.  Shifting/ Airex pad activity Step ups/ over 3" and 6" plinths.      Tx progress limited by generalized muscle weakness/ fatigue today.         Pt. presents to PT with increase fatigue/ more difficulty with gait with use of rollator.  Pt. requires several more seated rest breaks during tx. session.  CGA t/o tx. session with standing hip/LE ex. and balance tasks due to increase levels of muscle fasciculations as compared to last several tx. session.  No increase c/o back pain but overall generalized weakness and fatigue limit ability to progress tx. plan.        PT Long Term Goals - 01/07/17 1844      PT LONG TERM GOAL #1   Title Pt will report ability to ambulate in the community without the use of a wheelchair to allow patient to participate in desired activities   Baseline requires wheelchair   Time 4   Period Weeks   Status Not Met     PT LONG TERM GOAL #2   Title Pt. will increase Berg balance test to >55/56 to improve gait with least restrictive device/ promote decrease fall risk.     Baseline Berg 52/56 on 12/04/16   Time 4   Period Weeks   Status On-going     PT LONG TERM GOAL #3   Title Pt. will increase LEFS score to >  45 out of 80 to improve functional mobility.     Baseline LEFS: 36 out of 80 on 12/04/16.  LEFS: 36 out of 80.  No change.   Time 4   Period Weeks   Status Not Met     PT LONG TERM GOAL #4   Title Pt will report ability to work in her yard for greater than 30 minutes without extreme fatigue   Baseline limited to 5-10 minutes before pt requires ending activity   Time 4   Period Weeks   Status Not Met       Patient will benefit from skilled therapeutic intervention in order to improve the following deficits and impairments:  Abnormal gait, Improper body mechanics, Pain, Postural dysfunction, Decreased mobility, Decreased coordination, Decreased endurance, Decreased activity tolerance, Decreased range of motion, Decreased strength, Decreased safety  awareness, Decreased balance, Difficulty walking  Visit Diagnosis: Muscle weakness (generalized)  Difficulty in walking, not elsewhere classified     Problem List Patient Active Problem List   Diagnosis Date Noted  . Nephrolithiasis 04/16/2016  . Numbness 07/28/2015  . Bladder retention 06/23/2015  . Abdominal pain 06/04/2015  . Dizziness 05/18/2015  . Neck pain 05/18/2015  . Complicated migraine 66/19/6940  . Other fatigue 04/28/2015  . Abnormal finding on MRI of brain 04/28/2015  . D (diarrhea) 03/29/2015  . H/O disease 03/29/2015  . Abnormal weight loss 03/29/2015  . Muscle weakness (generalized) 03/14/2015  . Headache, migraine 03/10/2015   [name], PT, DPT # [phone] 01/21/2017, 4:35 PM  [place] [facility] [address] [place], [place], [address] Phone: [phone]   Fax:  [phone]  Name: Christine Chavez MRN: [id number] Date of Birth: October 28, 1980

## 2017-01-23 ENCOUNTER — Ambulatory Visit: Payer: 59 | Admitting: Physical Therapy

## 2017-01-25 ENCOUNTER — Ambulatory Visit: Payer: 59 | Admitting: Physical Therapy

## 2017-01-25 ENCOUNTER — Encounter: Payer: Self-pay | Admitting: Physical Therapy

## 2017-01-25 DIAGNOSIS — M6281 Muscle weakness (generalized): Secondary | ICD-10-CM

## 2017-01-25 DIAGNOSIS — R262 Difficulty in walking, not elsewhere classified: Secondary | ICD-10-CM

## 2017-01-25 NOTE — Therapy (Addendum)
[place] [facility] [address] [place], [place], [address] Phone: [phone]   Fax:  [phone]  Physical Therapy Treatment  Patient Details  Name: Christine Chavez MRN: [id number] Date of Birth: 02/20/1981 Referring Provider: Dr. Luevenia Maxin  Encounter Date: 01/25/2017      PT End of Session - 01/26/17 1542    Visit Number 10   Number of Visits 15   Date for PT Re-Evaluation 01/31/17   PT Start Time 9702   PT Stop Time 6378   PT Time Calculation (min) 55 min   Activity Tolerance Patient tolerated treatment well;No increased pain;Patient limited by fatigue   Behavior During Therapy [facility] for tasks assessed/performed      Past Medical History:  Diagnosis Date  . Headache   . Migraines   . Renal disorder   . Vision abnormalities     Past Surgical History:  Procedure Laterality Date  . ANTERIOR CRUCIATE LIGAMENT REPAIR  1997  . CYSTOSCOPY WITH STENT PLACEMENT Right 04/17/2016   Procedure: CYSTOSCOPY WITH STENT PLACEMENT;  Surgeon: [name], MD;  Location: [facility] ORS;  Service: Urology;  Laterality: Right;  . EXPLORATORY LAPAROTOMY  1999  . KIDNEY STONE SURGERY  04/2016  . TONSILLECTOMY      There were no vitals filed for this visit.      Subjective Assessment - 01/25/17 1339    Limitations Lifting;Standing;Walking;House hold activities   Patient Stated Goals ambulate with rollator walker      Pt. states she is still having a difficult time with low back/kidney pain.  Pt. went to [facility] on Sunday but had to leave early due to not feeling well.        There.ex.:  Seated/standing hip ex. (2.5#): marching/ abd./ heel raises/ LAQ/ walking in //-bars with CGA for safety.  Sit to stands with no UE assist 12x. Forward/ backward/ sidestepping in //-bars with min. To no UE assist.   Reviewed HEP.  Scifit L5 10 min. B UE/LE (no rest breaks but fatigue noted).  No increase c/o pain.  (end of tx. Session).       Neuro:  Walking in clinic with no assistive device (CGA for safety) working on maintaining consistent BOS/ upright posture/ step pattern/ heel strike. Standing wt. Shifting/ Airex pad activity Step ups/ over 3" and 6" plinths.  Ambulate out of car with use of rollator (moderate difficulty initiating swing through phase of gait).     Tx progress limited by generalized muscle weakness/ fatigue today.         Pt. worked hard with PT with few rest breaks but remains limited with moderaet B LE muscle fatigue.  Pt. has difficulty initiating swing through on L and R LE while walking out ot car.  Significant B LE muscle fasciculations noted with all seated/standing ther.ex.  Pt. encouraged to continue with HEP/ standing ther.ex. and walk with use of rollator/ family assist.          PT Long Term Goals - 01/07/17 1844      PT LONG TERM GOAL #1   Title Pt will report ability to ambulate in the community without the use of a wheelchair to allow patient to participate in desired activities   Baseline requires wheelchair   Time 4   Period Weeks   Status Not Met     PT LONG TERM GOAL #2   Title Pt. will increase Berg balance test to >55/56 to improve gait with least restrictive  device/ promote decrease fall risk.     Baseline Berg 52/56 on 12/04/16   Time 4   Period Weeks   Status On-going     PT LONG TERM GOAL #3   Title Pt. will increase LEFS score to >45 out of 80 to improve functional mobility.     Baseline LEFS: 36 out of 80 on 12/04/16.  LEFS: 36 out of 80.  No change.   Time 4   Period Weeks   Status Not Met     PT LONG TERM GOAL #4   Title Pt will report ability to work in her yard for greater than 30 minutes without extreme fatigue   Baseline limited to 5-10 minutes before pt requires ending activity   Time 4   Period Weeks   Status Not Met               Plan - 01/26/17 1542    Clinical Presentation Stable   Clinical Decision Making Moderate   Rehab  Potential Fair   PT Frequency 2x / week   PT Duration 4 weeks   PT Treatment/Interventions ADLs/Self Care Home Management;Gait training;Stair training;Functional mobility training;Therapeutic activities;Therapeutic exercise;Balance training;Patient/family education;Neuromuscular re-education;Manual techniques;Wheelchair mobility training;Passive range of motion;Energy conservation   PT Next Visit Plan Progress HEP/ issue proprioceptive ex.    PT Home Exercise Plan see pt education section   Consulted and Agree with Plan of Care Patient      Patient will benefit from skilled therapeutic intervention in order to improve the following deficits and impairments:  Abnormal gait, Improper body mechanics, Pain, Postural dysfunction, Decreased mobility, Decreased coordination, Decreased endurance, Decreased activity tolerance, Decreased range of motion, Decreased strength, Decreased safety awareness, Decreased balance, Difficulty walking  Visit Diagnosis: Muscle weakness (generalized)  Difficulty in walking, not elsewhere classified     Problem List Patient Active Problem List   Diagnosis Date Noted  . Nephrolithiasis 04/16/2016  . Numbness 07/28/2015  . Bladder retention 06/23/2015  . Abdominal pain 06/04/2015  . Dizziness 05/18/2015  . Neck pain 05/18/2015  . Complicated migraine 17/35/6701  . Other fatigue 04/28/2015  . Abnormal finding on MRI of brain 04/28/2015  . D (diarrhea) 03/29/2015  . H/O disease 03/29/2015  . Abnormal weight loss 03/29/2015  . Muscle weakness (generalized) 03/14/2015  . Headache, migraine 03/10/2015   [name], PT, DPT # [phone] 01/26/2017, 3:44 PM  [facility] [address] [place], [place], [address] Phone: [phone]   Fax:  [phone]  Name: Christine Chavez MRN: [id number] Date of Birth: 02-13-1981

## 2017-01-29 ENCOUNTER — Ambulatory Visit: Payer: 59 | Admitting: Physical Therapy

## 2017-01-29 ENCOUNTER — Encounter: Payer: Self-pay | Admitting: Physical Therapy

## 2017-01-29 DIAGNOSIS — M6281 Muscle weakness (generalized): Secondary | ICD-10-CM | POA: Diagnosis not present

## 2017-01-29 DIAGNOSIS — R262 Difficulty in walking, not elsewhere classified: Secondary | ICD-10-CM

## 2017-01-29 NOTE — Therapy (Signed)
[facility] [address] [place], [place], [address] Phone: [phone]   Fax:  [phone]  Physical Therapy Treatment  Patient Details  Name: Christine Chavez MRN: [id number] Date of Birth: Jul 07, 1980 Referring Provider: Dr. Vance Gather  Encounter Date: 01/29/2017      PT End of Session - 01/29/17 0918    Visit Number 11   Number of Visits 15   Date for PT Re-Evaluation 01/31/17   PT Start Time 0801   PT Stop Time 0850   PT Time Calculation (min) 49 min   Activity Tolerance Patient tolerated treatment well;No increased pain;Patient limited by fatigue   Behavior During Therapy [facility] for tasks assessed/performed      Past Medical History:  Diagnosis Date  . Headache   . Migraines   . Renal disorder   . Vision abnormalities     Past Surgical History:  Procedure Laterality Date  . ANTERIOR CRUCIATE LIGAMENT REPAIR  1997  . CYSTOSCOPY WITH STENT PLACEMENT Right 04/17/2016   Procedure: CYSTOSCOPY WITH STENT PLACEMENT;  Surgeon: Malen Gauze, MD;  Location: [facility] ORS;  Service: Urology;  Laterality: Right;  . EXPLORATORY LAPAROTOMY  1999  . KIDNEY STONE SURGERY  04/2016  . TONSILLECTOMY      There were no vitals filed for this visit.      Subjective Assessment - 01/29/17 0758    Subjective Pt reports hse is doing ok. Her kidney pain was pretty bad Saturday, but is not painful at this time. Pt went to [employer] on Sunday but stayed seated throughout the service.    Limitations Lifting;Standing;Walking;House hold activities   Patient Stated Goals ambulate with rollator walker   Currently in Pain? No/denies   Pain Onset More than a month ago        Treatment:  Warm-up: Scifit L5 9 min. B UE/LE Standing toe taps, weight shift, dynamic balance, marching,   Ther-ex:  Mini-squat to chair with 4" Airex foam for raised surface x 15, no UE assist with intermittent minA for minor posterior LOB   Standing:    Heel  raises x 20 with BUE assist 2-3 sec hold   TKE with green band x 10 each LE with 3-5 sec hold Supine:  Bridge/ bridge with march x 5 alternating steps x 10 reps Sitting:  Adductor ball squeeze  HEP given for TKE, and standing 4-way hip AROM with BUE assist, and mini-squat to bed/chair.  Neuro Re-educaiton: Standing in // bars:  Weight shifting on 2" Airex with stance progression/weighted ball pass, wide stance lateral weight shift, wide staggered stance AP  weight shifting with each LE forward 2 x 10 each stance (80 reps total), static stance with eyes closed 2 x 30 sec   BOSU mediolateral rocking, PF/DF rocking with 3 sec hold at end range 2 x 10 each direction   Gait   Instructed in gait around gym with/without Rollator x 170' approx. Pt with slight crouch gait with ataxic/abberant movement in knees/ankles, antalgic pattern with decreased stance time on RLE. CGA for safety and cues to extend L knee on heel strike and increase stance time on RLE. Pt able to improve gait slightly with cueing.            PT Education - 01/29/17 0916    Education provided Yes   Education Details Ther-ex, balance/weight shifting, HEP advanced   Person(s) Educated Patient   Methods Explanation;Tactile cues;Demonstration;Verbal cues;Handout   Comprehension Verbal cues required;Tactile cues required;Returned  demonstration;Verbalized understanding             PT Long Term Goals - 01/07/17 1844      PT LONG TERM GOAL #1   Title Pt will report ability to ambulate in the community without the use of a wheelchair to allow patient to participate in desired activities   Baseline requires wheelchair   Time 4   Period Weeks   Status Not Met     PT LONG TERM GOAL #2   Title Pt. will increase Berg balance test to >55/56 to improve gait with least restrictive device/ promote decrease fall risk.     Baseline Berg 52/56 on 12/04/16   Time 4   Period Weeks   Status On-going     PT LONG TERM GOAL #3    Title Pt. will increase LEFS score to >45 out of 80 to improve functional mobility.     Baseline LEFS: 36 out of 80 on 12/04/16.  LEFS: 36 out of 80.  No change.   Time 4   Period Weeks   Status Not Met     PT LONG TERM GOAL #4   Title Pt will report ability to work in her yard for greater than 30 minutes without extreme fatigue   Baseline limited to 5-10 minutes before pt requires ending activity   Time 4   Period Weeks   Status Not Met               Plan - 01/29/17 0920    Clinical Impression Statement Pt was able to do standing balance exercise without requiring a rest break until halfway through session. She continues to have mildly ataxic gait with aberrant mediolateral ankle movement and mild crouch gait. Pt is able to improve her gait slightly with cueing, however gait is worse following session when pt is fatigued. Pt is following up with Neurologist this week   Clinical Presentation Stable   Clinical Decision Making Moderate   Rehab Potential Fair   PT Frequency 2x / week   PT Duration 4 weeks   PT Treatment/Interventions ADLs/Self Care Home Management;Gait training;Stair training;Functional mobility training;Therapeutic activities;Therapeutic exercise;Balance training;Patient/family education;Neuromuscular re-education;Manual techniques;Wheelchair mobility training;Passive range of motion;Energy conservation   PT Next Visit Plan Follow up on HEP, consider adding t-band resistance to standing hip AROM, perform standing weight shift and min-squats with 2-4" foam   PT Home Exercise Plan Advanced with TKE, and standing 4-way hip AROM with BUE assist, and mini-squat to bed/chair.   Consulted and Agree with Plan of Care Patient      Patient will benefit from skilled therapeutic intervention in order to improve the following deficits and impairments:  Abnormal gait, Improper body mechanics, Pain, Postural dysfunction, Decreased mobility, Decreased coordination, Decreased  endurance, Decreased activity tolerance, Decreased range of motion, Decreased strength, Decreased safety awareness, Decreased balance, Difficulty walking  Visit Diagnosis: Muscle weakness (generalized)  Difficulty in walking, not elsewhere classified     Problem List Patient Active Problem List   Diagnosis Date Noted  . Nephrolithiasis 04/16/2016  . Numbness 07/28/2015  . Bladder retention 06/23/2015  . Abdominal pain 06/04/2015  . Dizziness 05/18/2015  . Neck pain 05/18/2015  . Complicated migraine 97/41/6384  . Other fatigue 04/28/2015  . Abnormal finding on MRI of brain 04/28/2015  . D (diarrhea) 03/29/2015  . H/O disease 03/29/2015  . Abnormal weight loss 03/29/2015  . Muscle weakness (generalized) 03/14/2015  . Headache, migraine 03/10/2015     Sofiah Lyne M Marnesha Gagen, SPT  [name], PT, DPT # [id number] 01/29/2017, 5:30 PM  [facility] [address] [place], [place], [address] Phone: [phone]   Fax:  [phone]  Name: Crestina Strike MRN: [id number] Date of Birth: 03-22-1981

## 2017-02-01 ENCOUNTER — Encounter: Payer: Self-pay | Admitting: Physical Therapy

## 2017-02-01 ENCOUNTER — Ambulatory Visit: Payer: 59 | Admitting: Physical Therapy

## 2017-02-01 DIAGNOSIS — M6281 Muscle weakness (generalized): Secondary | ICD-10-CM

## 2017-02-01 DIAGNOSIS — R262 Difficulty in walking, not elsewhere classified: Secondary | ICD-10-CM

## 2017-02-01 NOTE — Therapy (Signed)
[facility] [address] [place], [place], [address] Phone: [phone]   Fax:  [phone]  Physical Therapy Treatment/ Progress Note  Patient Details  Name: Christine Chavez MRN: [id number] Date of Birth: Dec 13, 1980 Referring Provider: Dr. Vance Gather  Encounter Date: 02/01/2017      PT End of Session - 02/01/17 1125    Visit Number 12   Number of Visits 19   Date for PT Re-Evaluation 03/01/17   PT Start Time 0801   PT Stop Time 0856   PT Time Calculation (min) 55 min   Equipment Utilized During Treatment Gait belt   Activity Tolerance Patient tolerated treatment well;No increased pain;Patient limited by fatigue   Behavior During Therapy [facility] for tasks assessed/performed      Past Medical History:  Diagnosis Date  . Headache   . Migraines   . Renal disorder   . Vision abnormalities     Past Surgical History:  Procedure Laterality Date  . ANTERIOR CRUCIATE LIGAMENT REPAIR  1997  . CYSTOSCOPY WITH STENT PLACEMENT Right 04/17/2016   Procedure: CYSTOSCOPY WITH STENT PLACEMENT;  Surgeon: Malen Gauze, MD;  Location: [facility] ORS;  Service: Urology;  Laterality: Right;  . EXPLORATORY LAPAROTOMY  1999  . KIDNEY STONE SURGERY  04/2016  . TONSILLECTOMY      There were no vitals filed for this visit.      Subjective Assessment - 02/01/17 0804    Subjective Pt reports she is doing well; she says her back pain is not as bad, maybe 2/10. Pt saw her MD last week and may be interested in going to the [facility] clinic for further evaluation.    Limitations Lifting;Standing;Walking;House hold activities   Patient Stated Goals ambulate with rollator walker   Currently in Pain? Yes   Pain Score 2    Pain Location Back   Pain Orientation Lower   Pain Descriptors / Indicators Aching   Pain Type Chronic pain   Pain Onset More than a month ago   Pain Frequency Intermittent   Aggravating Factors  Nothing identified   Pain Relieving  Factors Heating pad   Effect of Pain on Daily Activities Decreased quality of life         Follow-up on 4-way hip and mini-squat HEP   Treatment:   Warm-up: Scifit L5 x 7.5 min BUE/BLE (un-billed)   Neuro Re-educaiton: Standing in // bars:             BOSU ankle rocking rocking, PF/DF and lateral rocking with  2-3 sec hold (pt with multiple posterior LOB requiring  minA/BUE assist to regain balance with performing toe raise) x  20 each direction  Toe taps to 6 x 6" cones with UE assist decreased to no UE  assist after first set on each LE. 10 x 6 cones each LE (pt with  increased lateral leaning towards stance LE to maintain SLS)  High march with 2 sec holds with min UE assist and 2.5# ankle  weight x 20 steps each LE alternating (pt requiring occasional  UE assist to regain balance)    Step response/strategy training with quick release forward, backwards, and laterally beginning with countdown to relaease and progression to random release. Pt was able to regain balance with a single step in 2/3 attempts with intermittent minA to prevent fall. Pt with abberant trunk and UE movement with step with lateral trunk flexion towards stepping LE  Kneeling on mat table  Half-kneeling with  forward and lateral reaching/ reaching with  small weighted ball with cues for increased hip strategy. Pt  showed improvement in-session                    PT Education - 02/01/17 1124    Education provided Yes   Education Details Ther-ex, balance strategies: hip, step, HEP updated   Person(s) Educated Patient   Methods Explanation;Demonstration;Tactile cues;Verbal cues;Handout   Comprehension Tactile cues required;Verbal cues required;Returned demonstration;Verbalized understanding            PT Long Term Goals - 02/01/17 1122      PT LONG TERM GOAL #1   Title Pt will report ability to ambulate in the community without the use of a wheelchair to allow patient to participate in desired activities    Baseline Pt able to walk limited community distances with Rollator and intermittent SBA depending on fatigue level   Time 4   Period Weeks   Status Partially Met   Target Date 03/01/17     PT LONG TERM GOAL #2   Title Pt. will increase Berg balance test to >55/56 to improve gait with least restrictive device/ promote decrease fall risk.     Baseline Berg 52/56 on 12/04/16   Time 4   Period Weeks   Status On-going   Target Date 03/01/17     PT LONG TERM GOAL #3   Title Pt. will increase LEFS score to >45 out of 80 to improve functional mobility.     Baseline LEFS: 36 out of 80 on 12/04/16.  LEFS: 36 out of 80.  No change.   Time 4   Period Weeks   Status On-going   Target Date 03/01/17     PT LONG TERM GOAL #4   Title Pt will report ability to work in her yard for greater than 30 minutes without extreme fatigue   Baseline limited to 10-15 minutes before pt requires ending activity   Time 4   Period Weeks   Status Partially Met   Target Date 03/01/17             Plan - 02/01/17 1132    Clinical Impression Statement Pt has not been doing her HEP recently due to being busy with appointments and emotional/psychological stress from uncertainty of diagnosis/prognosis. Pt continues to have numbness/tingling in UEs/LEs, R side worse than L. Session was started with SciFit bike today rather than doing bike for cool-down at end of session. Session was ended with hip strategy training in half kneeling. Pt felt some dizziness and nausea at end of session requiring sitting rest break before walking to car. Pt's gait mechanics and stability declined steeply at the end of session with pt requiring CGA-minA and increased time to ambulate to her car. When fatigued pt's has decreased ankle control with reduced clearance in swing, excessive lateral trunk lean towards stance leg, and heavy reliance on UEs through Rollator.   Clinical Presentation Stable   Clinical Decision Making Moderate    Rehab Potential Fair   PT Frequency 2x / week   PT Duration 4 weeks   PT Treatment/Interventions ADLs/Self Care Home Management;Gait training;Stair training;Functional mobility training;Therapeutic activities;Therapeutic exercise;Balance training;Patient/family education;Neuromuscular re-education;Manual techniques;Wheelchair mobility training;Passive range of motion;Energy conservation   PT Next Visit Plan Continued: consider adding t-band resistance to standing hip AROM, perform standing weight shift and min-squats with 2-4" foam   PT Home Exercise Plan Continued with TKE, and standing 4-way hip AROM with BUE assist,  and mini-squat to bed/chair.   Consulted and Agree with Plan of Care Patient      Patient will benefit from skilled therapeutic intervention in order to improve the following deficits and impairments:  Abnormal gait, Improper body mechanics, Pain, Postural dysfunction, Decreased mobility, Decreased coordination, Decreased endurance, Decreased activity tolerance, Decreased range of motion, Decreased strength, Decreased safety awareness, Decreased balance, Difficulty walking  Visit Diagnosis: Muscle weakness (generalized)  Difficulty in walking, not elsewhere classified     Problem List Patient Active Problem List   Diagnosis Date Noted  . Nephrolithiasis 04/16/2016  . Numbness 07/28/2015  . Bladder retention 06/23/2015  . Abdominal pain 06/04/2015  . Dizziness 05/18/2015  . Neck pain 05/18/2015  . Complicated migraine 13/14/3888  . Other fatigue 04/28/2015  . Abnormal finding on MRI of brain 04/28/2015  . D (diarrhea) 03/29/2015  . H/O disease 03/29/2015  . Abnormal weight loss 03/29/2015  . Muscle weakness (generalized) 03/14/2015  . Headache, migraine 03/10/2015     Treshawn Allen Lenis Dickinson, SPT [name], PT, DPT # [id number] 02/01/2017, 11:46 AM  [facility] [address] [place], [place], [address] Phone:  [phone]   Fax:  [phone]  Name: Haiden Clucas MRN: [id number] Date of Birth: [birth year]

## 2017-02-05 ENCOUNTER — Telehealth: Payer: Self-pay

## 2017-02-05 NOTE — Telephone Encounter (Signed)
Pt called stating she is still having back pain like she does when she has a UTI. Pt is concerned that infection is not completely gone. Pt was added to nurse schedule for tomorrow to give another urine specimen.

## 2017-02-06 ENCOUNTER — Ambulatory Visit: Payer: 59 | Admitting: Physical Therapy

## 2017-02-06 ENCOUNTER — Ambulatory Visit (INDEPENDENT_AMBULATORY_CARE_PROVIDER_SITE_OTHER): Payer: 59

## 2017-02-06 ENCOUNTER — Encounter: Payer: Self-pay | Admitting: Physical Therapy

## 2017-02-06 VITALS — BP 117/81 | HR 93 | Ht 67.0 in | Wt 139.0 lb

## 2017-02-06 DIAGNOSIS — N390 Urinary tract infection, site not specified: Secondary | ICD-10-CM

## 2017-02-06 DIAGNOSIS — M6281 Muscle weakness (generalized): Secondary | ICD-10-CM | POA: Diagnosis not present

## 2017-02-06 DIAGNOSIS — R262 Difficulty in walking, not elsewhere classified: Secondary | ICD-10-CM

## 2017-02-06 LAB — MICROSCOPIC EXAMINATION
Epithelial Cells (non renal): NONE SEEN /hpf (ref 0–10)
WBC, UA: NONE SEEN /hpf (ref 0–?)

## 2017-02-06 LAB — URINALYSIS, COMPLETE
Bilirubin, UA: NEGATIVE
Glucose, UA: NEGATIVE
Leukocytes, UA: NEGATIVE
Nitrite, UA: POSITIVE — AB
Specific Gravity, UA: >1.03 — ABNORMAL HIGH (ref 1.005–1.030)
Urobilinogen, Ur: 1 mg/dL (ref 0.2–1.0)
pH, UA: 5.5 (ref 5.0–7.5)

## 2017-02-06 MED ORDER — AMOXICILLIN-POT CLAVULANATE 875-125 MG PO TABS: 1.0000 | ORAL_TABLET | Freq: Two times a day (BID) | ORAL | 0 refills | Status: AC

## 2017-02-06 NOTE — Progress Notes (Signed)
Pt presents today with c/o nausea, back pain, and lower abd pain. Pt has been on macrobid in the past month for a UTI. Pt provided a cath specimen for u/a and cx.   Vitals:   02/06/17 1122  BP: 117/81  Pulse: 93  Weight: 139 lb (63 kg)  Height: 5\' 7"  (1.702 m)    Per Carollee HerterShannon pt should start augmentin bid x7 days until we get ucx results.

## 2017-02-06 NOTE — Progress Notes (Signed)
Please have the patient start Augmentin 875/125, one tablet bid x 7.  We will adjust once culture results are available. Please ask the patient if she suctioned active and if these infections have a correlation with sexual activity.

## 2017-02-06 NOTE — Therapy (Signed)
[facility] [address]. [place], [place], [address] Phone: [phone]   Fax:  [phone]  Physical Therapy Treatment  Patient Details  Name: Christine Chavez MRN: [id number] Date of Birth: 06-16-1980 Referring Provider: Dr. Luevenia Maxin  Encounter Date: 02/06/2017      PT End of Session - 02/06/17 1217    Visit Number 13   Number of Visits 19   Date for PT Re-Evaluation 03/01/17   PT Start Time 1101   PT Stop Time 1157   PT Time Calculation (min) 56 min   Equipment Utilized During Treatment Gait belt   Activity Tolerance Patient tolerated treatment well;No increased pain;Patient limited by fatigue   Behavior During Therapy [facility] for tasks assessed/performed      Past Medical History:  Diagnosis Date  . Headache   . Migraines   . Renal disorder   . Vision abnormalities     Past Surgical History:  Procedure Laterality Date  . ANTERIOR CRUCIATE LIGAMENT REPAIR  1997  . CYSTOSCOPY WITH STENT PLACEMENT Right 04/17/2016   Procedure: CYSTOSCOPY WITH STENT PLACEMENT;  Surgeon: [name], MD;  Location: [facility] ORS;  Service: Urology;  Laterality: Right;  . EXPLORATORY LAPAROTOMY  1999  . KIDNEY STONE SURGERY  04/2016  . TONSILLECTOMY      There were no vitals filed for this visit.      Subjective Assessment - 02/06/17 1109    Subjective Pt reports that she had a fall in a bathroom stall on Saturday without injury; she says that her legs had gotten cold and stiff and she felt she could not mover her leg as she fell to the R. Pt has also been having increased back pain and gave a sample to the Urologist today.   Limitations Lifting;Standing;Walking;House hold activities   Patient Stated Goals ambulate with rollator walker   Currently in Pain? Yes   Pain Score 2    Pain Location Back   Pain Orientation Lower   Pain Descriptors / Indicators Aching   Pain Onset More than a month ago   Pain Frequency Intermittent    Aggravating Factors  Nothing identified   Pain Relieving Factors Heating pad   Effect of Pain on Daily Activities Decreased activity level       Treatment:   Warm-up: Scifit L5 x 10 min BUE/BLE (un-billed)   Follow-up on 4-way hip and mini-squat HEP: pt is not performing at home currently.   Following warm-up:  There-activity:  Fall recovery training on blue floor mat from tall kneeling to stand with CGA-minA x 4 and from side-sitting to stand x 3 with no UE assist and x 2 with UE assist. Pt requires UE assist for safe transfer from floor to stand. Discussed using furniture to stand if pt falls at home.    Following ther-activity:  Neuro Re-education: Standing in // bars:             BOSU ankle rocking, PF/DF and lateral rocking  3-way hip resisted with yellow t-band x 15 reps each direction with each LE with min UE assist and mirror biofeedback and cue for upright posture.               Pt required sitting rest break x 2 due to LE fatigue causing increased LE fasciculations/ knees buckling         PT Education - 02/06/17 1217    Education provided Yes   Education Details Fall recovery training  Methods Explanation;Demonstration;Verbal cues;Tactile cues   Comprehension Verbal cues required;Returned demonstration;Verbalized understanding;Tactile cues required             PT Long Term Goals - 02/01/17 1122      PT LONG TERM GOAL #1   Title Pt will report ability to ambulate in the community without the use of a wheelchair to allow patient to participate in desired activities   Baseline Pt able to walk limited community distances with Rollator and intermittent SBA depending on fatigue level   Time 4   Period Weeks   Status Partially Met   Target Date 03/01/17     PT LONG TERM GOAL #2   Title Pt. will increase Berg balance test to >55/56 to improve gait with least restrictive device/ promote decrease fall risk.     Baseline Berg 52/56 on 12/04/16   Time 4   Period  Weeks   Status On-going   Target Date 03/01/17     PT LONG TERM GOAL #3   Title Pt. will increase LEFS score to >45 out of 80 to improve functional mobility.     Baseline LEFS: 36 out of 80 on 12/04/16.  LEFS: 36 out of 80.  No change.   Time 4   Period Weeks   Status On-going   Target Date 03/01/17     PT LONG TERM GOAL #4   Title Pt will report ability to work in her yard for greater than 30 minutes without extreme fatigue   Baseline limited to 10-15 minutes before pt requires ending activity   Time 4   Period Weeks   Status Partially Met   Target Date 03/01/17               Plan - 02/06/17 1219    Clinical Impression Statement Pt reports increased back pain over the past week with possible UTI; urine sample was submitted for testing today. Pt had a fall without injury last Saturday in a public bathroom stall. Today pt was able to transfer floor to standing with CGA and only min UE support through raised mat table. Pt's primary limitation is balance/coordination. Pt is interested in seeking treatment at [facility] clinic or [facility] per neurologist's recommendation.    Clinical Presentation Stable   Clinical Decision Making Moderate   Rehab Potential Fair   PT Frequency 2x / week   PT Duration 4 weeks   PT Treatment/Interventions ADLs/Self Care Home Management;Gait training;Stair training;Functional mobility training;Therapeutic activities;Therapeutic exercise;Balance training;Patient/family education;Neuromuscular re-education;Manual techniques;Wheelchair mobility training;Passive range of motion;Energy conservation   PT Next Visit Plan Follow-up on resisted 4-way hip, tall kneeing/half kneeing weight shift for hip strategy, mini-squat with 2-4" foam, march with 2.5# ankle weights   PT Home Exercise Plan Continued resisted 4-way hip; Continued with TKE, and standing 4-way hip AROM with BUE assist, and mini-squat to bed/chair.   Consulted and Agree with Plan of Care Patient       Patient will benefit from skilled therapeutic intervention in order to improve the following deficits and impairments:  Abnormal gait, Improper body mechanics, Pain, Postural dysfunction, Decreased mobility, Decreased coordination, Decreased endurance, Decreased activity tolerance, Decreased range of motion, Decreased strength, Decreased safety awareness, Decreased balance, Difficulty walking  Visit Diagnosis: Muscle weakness (generalized)  Difficulty in walking, not elsewhere classified     Problem List Patient Active Problem List   Diagnosis Date Noted  . Nephrolithiasis 04/16/2016  . Numbness 07/28/2015  . Bladder retention 06/23/2015  . Abdominal pain 06/04/2015  .  Dizziness 05/18/2015  . Neck pain 05/18/2015  . Complicated migraine 58/34/6219  . Other fatigue 04/28/2015  . Abnormal finding on MRI of brain 04/28/2015  . D (diarrhea) 03/29/2015  . H/O disease 03/29/2015  . Abnormal weight loss 03/29/2015  . Muscle weakness (generalized) 03/14/2015  . Headache, migraine 03/10/2015   Darien Mignogna Lenis Dickinson, SPT [name], PT, DPT # [id number] 02/06/2017, 1:22 PM  [facility] [address] [place], [place], [address] Phone: [phone]   Fax:  [phone]  Name: Christine Chavez MRN: [id number] Date of Birth: 1981/02/19

## 2017-02-08 ENCOUNTER — Encounter: Payer: Self-pay | Admitting: Physical Therapy

## 2017-02-08 ENCOUNTER — Ambulatory Visit: Payer: 59 | Admitting: Physical Therapy

## 2017-02-08 VITALS — BP 119/89 | HR 96

## 2017-02-08 DIAGNOSIS — M6281 Muscle weakness (generalized): Secondary | ICD-10-CM | POA: Diagnosis not present

## 2017-02-08 DIAGNOSIS — R262 Difficulty in walking, not elsewhere classified: Secondary | ICD-10-CM

## 2017-02-08 NOTE — Therapy (Signed)
[facility] [address] [place], [place], [address] Phone: [phone]   Fax:  [phone]  Physical Therapy Treatment  Patient Details  Name: Christine Chavez MRN: [id number] Date of Birth: [birth year] Referring Provider: Dr. Vance Gather  Encounter Date: 02/08/2017      PT End of Session - 02/08/17 1235    Visit Number 14   Number of Visits 19   Date for PT Re-Evaluation 03/01/17   PT Start Time 1031   PT Stop Time 1112   PT Time Calculation (min) 41 min   Equipment Utilized During Treatment Gait belt   Activity Tolerance Patient tolerated treatment well;Patient limited by fatigue;Treatment limited secondary to medical complications (Comment);Patient limited by lethargy   Behavior During Therapy [facility] for tasks assessed/performed;Flat affect      Past Medical History:  Diagnosis Date  . Headache   . Migraines   . Renal disorder   . Vision abnormalities     Past Surgical History:  Procedure Laterality Date  . ANTERIOR CRUCIATE LIGAMENT REPAIR  1997  . CYSTOSCOPY WITH STENT PLACEMENT Right 04/17/2016   Procedure: CYSTOSCOPY WITH STENT PLACEMENT;  Surgeon: Malen Gauze, MD;  Location: [facility] ORS;  Service: Urology;  Laterality: Right;  . EXPLORATORY LAPAROTOMY  1999  . KIDNEY STONE SURGERY  04/2016  . TONSILLECTOMY      Vitals:   02/08/17 1103  BP: 119/89  Pulse: 96  SpO2: 98%        Subjective Assessment - 02/08/17 1103    Subjective Pt feeling sick today; she reports she has no eaten since last night. Back pain today 4/10   Pain Location Back   Pain Orientation Lower   Pain Descriptors / Indicators Aching   Pain Type Chronic pain   Pain Onset More than a month ago   Pain Frequency Intermittent   Aggravating Factors  Nothing identified    Pain Relieving Factors Heating pad   Effect of Pain on Daily Activities Decreased activity     Treatment:  Manual:  Stretches in supine for hip and LE including  piriformis, glut med, hamstring, hip flexors, quads; using muscle energy technique for contract-relax stretch, 5 sec contract and 20 sec relax x 3 iterations each side; lumbar rotation stretch   (session discontinued due to pt feeling sick)        PT Education - 02/08/17 1234    Education provided Yes   Education Details Vitals; importance of eating/drinking water to avoid dehydration   Person(s) Educated Patient   Methods Explanation   Comprehension Verbalized understanding             PT Long Term Goals - 02/01/17 1122      PT LONG TERM GOAL #1   Title Pt will report ability to ambulate in the community without the use of a wheelchair to allow patient to participate in desired activities   Baseline Pt able to walk limited community distances with Rollator and intermittent SBA depending on fatigue level   Time 4   Period Weeks   Status Partially Met   Target Date 03/01/17     PT LONG TERM GOAL #2   Title Pt. will increase Berg balance test to >55/56 to improve gait with least restrictive device/ promote decrease fall risk.     Baseline Berg 52/56 on 12/04/16   Time 4   Period Weeks   Status On-going   Target Date 03/01/17     PT LONG TERM  GOAL #3   Title Pt. will increase LEFS score to >45 out of 80 to improve functional mobility.     Baseline LEFS: 36 out of 80 on 12/04/16.  LEFS: 36 out of 80.  No change.   Time 4   Period Weeks   Status On-going   Target Date 03/01/17     PT LONG TERM GOAL #4   Title Pt will report ability to work in her yard for greater than 30 minutes without extreme fatigue   Baseline limited to 10-15 minutes before pt requires ending activity   Time 4   Period Weeks   Status Partially Met   Target Date 03/01/17               Plan - 02/08/17 1236    Clinical Impression Statement Pt unable to participate in balance training or ther-ex today due to feeling sick with nausea and fatigue. Pt was started on antibiotics for UTI, though  cultures are not back yet. Pt has not eaten since last night due to low appetite and fear of vomiting. Pt educated on importance of hydrating and eating. Vitals were elevated slightly, taken in sitting (see vitals)   Clinical Presentation Stable   Clinical Decision Making Moderate   Rehab Potential Fair   PT Frequency 2x / week   PT Duration 4 weeks   PT Treatment/Interventions ADLs/Self Care Home Management;Gait training;Stair training;Functional mobility training;Therapeutic activities;Therapeutic exercise;Balance training;Patient/family education;Neuromuscular re-education;Manual techniques;Wheelchair mobility training;Passive range of motion;Energy conservation   PT Next Visit Plan Follow-up on resisted 4-way hip, tall kneeing/half kneeing weight shift for hip strategy, mini-squat with 2-4" foam, march with 2.5# ankle weights   PT Home Exercise Plan Continued resisted 4-way hip; Continued with TKE, and standing 4-way hip AROM with BUE assist, and mini-squat to bed/chair.   Consulted and Agree with Plan of Care Patient      Patient will benefit from skilled therapeutic intervention in order to improve the following deficits and impairments:  Abnormal gait, Improper body mechanics, Pain, Postural dysfunction, Decreased mobility, Decreased coordination, Decreased endurance, Decreased activity tolerance, Decreased range of motion, Decreased strength, Decreased safety awareness, Decreased balance, Difficulty walking  Visit Diagnosis: Muscle weakness (generalized)  Difficulty in walking, not elsewhere classified     Problem List Patient Active Problem List   Diagnosis Date Noted  . Nephrolithiasis 04/16/2016  . Numbness 07/28/2015  . Bladder retention 06/23/2015  . Abdominal pain 06/04/2015  . Dizziness 05/18/2015  . Neck pain 05/18/2015  . Complicated migraine 34/19/6222  . Other fatigue 04/28/2015  . Abnormal finding on MRI of brain 04/28/2015  . D (diarrhea) 03/29/2015  . H/O  disease 03/29/2015  . Abnormal weight loss 03/29/2015  . Muscle weakness (generalized) 03/14/2015  . Headache, migraine 03/10/2015   Burnett Corrente, SPT  02/08/2017, 12:41 PM   This entire session was performed under supervision and direction of a licensed therapist/therapist assistant . I have personally read, edited and approve of the note as written. Collie Siad PT, DPT    [facility] [address] [place], [place], [address] Phone: [phone]   Fax:  [phone]  Name: Christine Chavez MRN: [id number] Date of Birth: [birth year]

## 2017-02-09 ENCOUNTER — Telehealth: Payer: Self-pay | Admitting: *Deleted

## 2017-02-09 LAB — CULTURE, URINE COMPREHENSIVE

## 2017-02-09 NOTE — Telephone Encounter (Signed)
Spoke with patient and gave results. Patient told to finish her current antibiotic. Patient states that Leeroy BockChelsea told her she would probably need a recheck after her antibiotic and patient was told to call back two days after finishing the antibiotic if she is not feeling better and report her symptoms or not to be added to the nurse schedule for recheck after antibiotic use. Patient ok with the plan.

## 2017-02-09 NOTE — Telephone Encounter (Signed)
-----   Message from Harle BattiestShannon A McGowan, PA-C sent at 02/09/2017 12:21 PM EDT ----- Please let Christine Chavez know that her urine culture is back. She does have a urinary tract infection and the Augmentin that she was prescribed is the appropriate antibiotic.

## 2017-02-09 NOTE — Telephone Encounter (Signed)
Patient called to ask about culture results I let her know that the final result is not back as yet and as soon as it is the Doctor will instruct as with what to do next. Patient ok with plan.

## 2017-02-13 ENCOUNTER — Encounter: Payer: Self-pay | Admitting: Physical Therapy

## 2017-02-13 ENCOUNTER — Ambulatory Visit: Payer: 59 | Admitting: Physical Therapy

## 2017-02-13 DIAGNOSIS — M6281 Muscle weakness (generalized): Secondary | ICD-10-CM

## 2017-02-13 DIAGNOSIS — R262 Difficulty in walking, not elsewhere classified: Secondary | ICD-10-CM

## 2017-02-13 NOTE — Therapy (Signed)
[place] [facility] [address] [place], [place], [address] Phone: [phone]   Fax:  [phone]  Physical Therapy Treatment  Patient Details  Name: Christine Chavez MRN: [id number] Date of Birth: 1980-08-22 Referring Provider: Dr. Luevenia Maxin  Encounter Date: 02/13/2017      PT End of Session - 02/13/17 1214    Visit Number 15   Number of Visits 19   Date for PT Re-Evaluation 03/01/17   PT Start Time 0816   PT Stop Time 0917   PT Time Calculation (min) 61 min   Equipment Utilized During Treatment Gait belt   Activity Tolerance Patient tolerated treatment well;Patient limited by fatigue   Behavior During Therapy [facility] for tasks assessed/performed      Past Medical History:  Diagnosis Date  . Headache   . Migraines   . Renal disorder   . Vision abnormalities     Past Surgical History:  Procedure Laterality Date  . ANTERIOR CRUCIATE LIGAMENT REPAIR  1997  . CYSTOSCOPY WITH STENT PLACEMENT Right 04/17/2016   Procedure: CYSTOSCOPY WITH STENT PLACEMENT;  Surgeon: [name], MD;  Location: [facility] ORS;  Service: Urology;  Laterality: Right;  . EXPLORATORY LAPAROTOMY  1999  . KIDNEY STONE SURGERY  04/2016  . TONSILLECTOMY      There were no vitals filed for this visit.      Subjective Assessment - 02/13/17 0819    Subjective Pt reports that she was instructed to continue current course of antibiotics after getting culture results from urology. She reports that last night she was having increased back pain again and continues to have increased fatigue. Pt was unable to stand from commode last night due to weakness.    Limitations Lifting;Standing;Walking;House hold activities   Patient Stated Goals ambulate with rollator walker   Pain Score 3    Pain Location Back   Pain Orientation Lower   Pain Descriptors / Indicators Aching   Pain Type Chronic pain   Pain Onset More than a month ago   Pain Frequency Intermittent   Aggravating Factors  Nothing identified   Pain Relieving Factors Heating pad   Effect of Pain on Daily Activities Decreased activity       Treatment:     Follow-up on 4-way hip and mini-squat HEP: pt is not performing at home currently.    Following warm-up:    Neuro Re-education: Standing in // bars:             BOSU ankle rocking, PF/DF and lateral rocking x 10 each direction              3-way hip resisted with yellow t-band x 10 each LE of hip flexion, abd, ext   Toe tap on step at 5" and 10" alternating with single UE assist x 10 each LE, and x 10 with no UE assit x 10 each LE with CGA and frequent minA  High march with 2.5# ankle weight   Backwards/forwards walking 2 x 3 laps with cues for larger step and weight shift onto stance leg   Ther-ex: Sitting:  LAQ with 2.5# ankle weight x 20 each LE  March with 2.5# ankle weights x 20 each LE Supine: Quad/hip flexor stretch using muscle energy technique for contract-relax stretch, 5 sec contract and 20 sec relax x 3 iterations each side  Long axis distraction of R hip x 2-3 min  Cool-down: Scifit L6 x 5 min BUE/BLE (un-billed) (pt had stop due  to R hip pain/muscle spasm)       PT Education - 02/13/17 1213    Education provided Yes   Education Details balance training, ther-ex   [employer]) Educated Patient   Methods Explanation;Demonstration;Tactile cues;Verbal cues   Comprehension Verbal cues required;Tactile cues required;Returned demonstration;Verbalized understanding             PT Long Term Goals - 02/01/17 1122      PT LONG TERM GOAL #1   Title Pt will report ability to ambulate in the community without the use of a wheelchair to allow patient to participate in desired activities   Baseline Pt able to walk limited community distances with Rollator and intermittent SBA depending on fatigue level   Time 4   Period Weeks   Status Partially Met   Target Date 03/01/17     PT LONG TERM GOAL #2   Title Pt. will  increase Berg balance test to >55/56 to improve gait with least restrictive device/ promote decrease fall risk.     Baseline Berg 52/56 on 12/04/16   Time 4   Period Weeks   Status On-going   Target Date 03/01/17     PT LONG TERM GOAL #3   Title Pt. will increase LEFS score to >45 out of 80 to improve functional mobility.     Baseline LEFS: 36 out of 80 on 12/04/16.  LEFS: 36 out of 80.  No change.   Time 4   Period Weeks   Status On-going   Target Date 03/01/17     PT LONG TERM GOAL #4   Title Pt will report ability to work in her yard for greater than 30 minutes without extreme fatigue   Baseline limited to 10-15 minutes before pt requires ending activity   Time 4   Period Weeks   Status Partially Met   Target Date 03/01/17               Plan - 02/13/17 1218    Clinical Impression Statement Pt is doing a little better today. She was able to participate in balance and strength training with 2-3 sitting rest breaks. When performing single leg stance activities pt has increased difficulty with weight shift to LLE when in stance on L. Pt with limited ankle control with weight shifting on BOSU. MMT of ankles: DF, inversion, eversion all 4-/5 bilaterally. Pt has slow activation of muscle with manual resistance; fibrillation noted with all resisted motions. Pt had R hip pain on SciFit today that was partially alleviated with hip flexor stretch and long axis distraction or R hip.    Clinical Presentation Stable   Clinical Decision Making Moderate   Rehab Potential Fair   PT Frequency 2x / week   PT Duration 4 weeks   PT Treatment/Interventions ADLs/Self Care Home Management;Gait training;Stair training;Functional mobility training;Therapeutic activities;Therapeutic exercise;Balance training;Patient/family education;Neuromuscular re-education;Manual techniques;Wheelchair mobility training;Passive range of motion;Energy conservation   PT Next Visit Plan Continue resisted 4-way hip, tall  kneeing/half kneeing weight shift for hip strategy, mini-squat with 2-4" foam, march with 2.5# ankle weights   PT Home Exercise Plan Continued resisted 4-way hip; Continued with TKE, and standing 4-way hip AROM with BUE assist, and mini-squat to bed/chair.   Consulted and Agree with Plan of Care Patient      Patient will benefit from skilled therapeutic intervention in order to improve the following deficits and impairments:  Abnormal gait, Improper body mechanics, Pain, Postural dysfunction, Decreased mobility, Decreased coordination, Decreased endurance, Decreased  activity tolerance, Decreased range of motion, Decreased strength, Decreased safety awareness, Decreased balance, Difficulty walking  Visit Diagnosis: Muscle weakness (generalized)  Difficulty in walking, not elsewhere classified     Problem List Patient Active Problem List   Diagnosis Date Noted  . Nephrolithiasis 04/16/2016  . Numbness 07/28/2015  . Bladder retention 06/23/2015  . Abdominal pain 06/04/2015  . Dizziness 05/18/2015  . Neck pain 05/18/2015  . Complicated migraine 23/41/4436  . Other fatigue 04/28/2015  . Abnormal finding on MRI of brain 04/28/2015  . D (diarrhea) 03/29/2015  . H/O disease 03/29/2015  . Abnormal weight loss 03/29/2015  . Muscle weakness (generalized) 03/14/2015  . Headache, migraine 03/10/2015   Chinwe Lope Lenis Dickinson, SPT [name], PT, DPT # [phone] 02/14/2017, 9:25 AM  [facility] [address] [place], [place], [address] Phone: [phone]   Fax:  [phone]  Name: Christine Chavez MRN: [id number] Date of Birth: 08/23/1980

## 2017-02-14 ENCOUNTER — Telehealth: Payer: Self-pay

## 2017-02-14 DIAGNOSIS — N200 Calculus of kidney: Secondary | ICD-10-CM

## 2017-02-14 NOTE — Telephone Encounter (Signed)
Spoke with pt in reference to needing a KUB. Made aware orders were placed and can go on a walk in basis to have xray. Pt voiced understanding.

## 2017-02-14 NOTE — Telephone Encounter (Signed)
If she feels that it may be a stone, I suggest she have a KUB to see if one of her renal stones had migrated down into her ureters.

## 2017-02-14 NOTE — Telephone Encounter (Signed)
Pt called stating this morning she took her last abx. Pt states that her pain and urinary discomfort is not any better. Pt does have a hx of kidney stones but states her pain and discomfort is not the same as when she had a stone. Pt stated that she is not able to connect the UTIs with intercourse. Should pt come to see a provider or just do another nurse visit and cath specimen?

## 2017-02-15 ENCOUNTER — Encounter: Payer: Self-pay | Admitting: Physical Therapy

## 2017-02-15 ENCOUNTER — Ambulatory Visit
Admission: RE | Admit: 2017-02-15 | Discharge: 2017-02-15 | Disposition: A | Discharge status: Home / Self Care | Payer: 59 | Source: Ambulatory Visit | Attending: Urology | Admitting: Urology

## 2017-02-15 ENCOUNTER — Ambulatory Visit: Payer: 59 | Attending: Neurosurgery | Admitting: Physical Therapy

## 2017-02-15 DIAGNOSIS — M6281 Muscle weakness (generalized): Secondary | ICD-10-CM | POA: Insufficient documentation

## 2017-02-15 DIAGNOSIS — N200 Calculus of kidney: Secondary | ICD-10-CM | POA: Insufficient documentation

## 2017-02-15 DIAGNOSIS — R262 Difficulty in walking, not elsewhere classified: Secondary | ICD-10-CM | POA: Insufficient documentation

## 2017-02-15 NOTE — Therapy (Addendum)
[place] [facility] [address] [place], [place], [address] Phone: [phone]   Fax:  [phone]  Physical Therapy Treatment  Patient Details  Name: Christine Chavez MRN: [id number] Date of Birth: 03-Mar-1981 Referring Provider: Dr. Luevenia Maxin  Encounter Date: 02/15/2017      PT End of Session - 02/15/17 1145    Visit Number 16   Number of Visits 19   Date for PT Re-Evaluation 03/01/17   PT Start Time 1011   PT Stop Time 1109   PT Time Calculation (min) 58 min   Equipment Utilized During Treatment Gait belt   Activity Tolerance Patient tolerated treatment well;Patient limited by fatigue   Behavior During Therapy [facility] for tasks assessed/performed      Past Medical History:  Diagnosis Date  . Headache   . Migraines   . Renal disorder   . Vision abnormalities     Past Surgical History:  Procedure Laterality Date  . ANTERIOR CRUCIATE LIGAMENT REPAIR  1997  . CYSTOSCOPY WITH STENT PLACEMENT Right 04/17/2016   Procedure: CYSTOSCOPY WITH STENT PLACEMENT;  Surgeon: [name], MD;  Location: [facility] ORS;  Service: Urology;  Laterality: Right;  . EXPLORATORY LAPAROTOMY  1999  . KIDNEY STONE SURGERY  04/2016  . TONSILLECTOMY      There were no vitals filed for this visit.      Subjective Assessment - 02/15/17 1133    Subjective Pt. entered PT with continued c/o low back discomfort.  Pt. scheduled to have low back x-rays to assess for kidney stones after PT tx. session today.     Limitations Lifting;Standing;Walking;House hold activities   Patient Stated Goals ambulate with rollator walker   Pain Score 3    Pain Location Back   Pain Orientation Lower   Pain Descriptors / Indicators Aching   Pain Type Chronic pain   Aggravating Factors  Kidney symptoms.          Treatment:  Neuro Re-education: Standing in // bars: BOSU ankle rocking, PF/DF and lateral rocking x 10 each direction  3-way  hip resisted with yellow t-band x 10 each LE of hip flexion, abd, ext            Toe tap on step at 5" and 10" alternating with single UE assist x 10 each LE, and x 10 with no UE assit x 10 each LE with CGA and frequent minA             High march with 2.5# ankle weight             Backwards/forwards walking 2 x 3 laps with cues for larger step and weight shift onto stance leg               Gait in PT clinic with no assistive device.  PT tactile assist with L/R hip rotn. During step pattern.  Ther-ex: Sitting:             LAQ with 2.5# ankle weight x 20 each LE             March with 2.5# ankle weights x 20 each LE Supine: Quad/hip flexor stretch using muscle energy technique for contract-relax stretch, 5 sec contract and 20 sec relax x 3 iterations each side             Sit to standing instruct.    Cool-down: Scifit L6x 5 min BUE/BLE (un-billed) (pt had stop due to R hip pain/muscle spasm)  Pt. fatigued t/o tx. session with persistent low back discomfort, no worsening of symptoms.  Significant muscle fasciculations in B LE with prolonged standing ther.ex./ balance tasks.  No change to HEP at this time and pt. instructed to contact PT if any change to POC after having low back X-ray.        PT Long Term Goals - 02/01/17 1122      PT LONG TERM GOAL #1   Title Pt will report ability to ambulate in the community without the use of a wheelchair to allow patient to participate in desired activities   Baseline Pt able to walk limited community distances with Rollator and intermittent SBA depending on fatigue level   Time 4   Period Weeks   Status Partially Met   Target Date 03/01/17     PT LONG TERM GOAL #2   Title Pt. will increase Berg balance test to >55/56 to improve gait with least restrictive device/ promote decrease fall risk.     Baseline Berg 52/56 on 12/04/16   Time 4   Period Weeks   Status On-going   Target Date 03/01/17     PT LONG TERM GOAL #3   Title Pt.  will increase LEFS score to >45 out of 80 to improve functional mobility.     Baseline LEFS: 36 out of 80 on 12/04/16.  LEFS: 36 out of 80.  No change.   Time 4   Period Weeks   Status On-going   Target Date 03/01/17     PT LONG TERM GOAL #4   Title Pt will report ability to work in her yard for greater than 30 minutes without extreme fatigue   Baseline limited to 10-15 minutes before pt requires ending activity   Time 4   Period Weeks   Status Partially Met   Target Date 03/01/17               Plan - 02/15/17 1146    Clinical Presentation Stable   Clinical Decision Making Moderate   Rehab Potential Fair   PT Frequency 2x / week   PT Duration 4 weeks   PT Treatment/Interventions ADLs/Self Care Home Management;Gait training;Stair training;Functional mobility training;Therapeutic activities;Therapeutic exercise;Balance training;Patient/family education;Neuromuscular re-education;Manual techniques;Wheelchair mobility training;Passive range of motion;Energy conservation   PT Next Visit Plan Progress tall kneeling/ balance tasks.  Discuss X-ray results.     PT Home Exercise Plan Continued resisted 4-way hip; Continued with TKE, and standing 4-way hip AROM with BUE assist, and mini-squat to bed/chair.   Consulted and Agree with Plan of Care Patient      Patient will benefit from skilled therapeutic intervention in order to improve the following deficits and impairments:  Abnormal gait, Improper body mechanics, Pain, Postural dysfunction, Decreased mobility, Decreased coordination, Decreased endurance, Decreased activity tolerance, Decreased range of motion, Decreased strength, Decreased safety awareness, Decreased balance, Difficulty walking  Visit Diagnosis: Muscle weakness (generalized)  Difficulty in walking, not elsewhere classified     Problem List Patient Active Problem List   Diagnosis Date Noted  . Nephrolithiasis 04/16/2016  . Numbness 07/28/2015  . Bladder  retention 06/23/2015  . Abdominal pain 06/04/2015  . Dizziness 05/18/2015  . Neck pain 05/18/2015  . Complicated migraine 04/28/2015  . Other fatigue 04/28/2015  . Abnormal finding on MRI of brain 04/28/2015  . D (diarrhea) 03/29/2015  . H/O disease 03/29/2015  . Abnormal weight loss 03/29/2015  . Muscle weakness (generalized) 03/14/2015  . Headache, migraine 03/10/2015   Casimiro Needle  Vernona Rieger, PT, DPT # [phone] 02/15/2017, 11:54 AM  [place] [facility] [address] [place], [place], [address] Phone: [phone]   Fax:  [phone]  Name: Jadie Allington MRN: [id number] Date of Birth: [birth year]

## 2017-02-16 ENCOUNTER — Telehealth: Payer: Self-pay

## 2017-02-16 NOTE — Telephone Encounter (Signed)
Spoke to pt gave results. Pt says she is still have sx(s). Scheduled appt.

## 2017-02-16 NOTE — Telephone Encounter (Signed)
-----   Message from Harle BattiestShannon A McGowan, PA-C sent at 02/15/2017  1:55 PM EDT ----- Please let Hillery know that her x-ray shows that her stones are still in her kidneys.  If she is still having symptoms, she should be seen to have more evaluation.

## 2017-02-19 NOTE — Progress Notes (Signed)
02/20/2017 8:41 AM   Christine Chavez 28-Aug-1980 [id number]  Referring provider: Dorita Fray, [place] [address], [place] [address]  No chief complaint on file.   HPI: Patient is a 36 year old Caucasian female who possibly has multiple sclerosis who presents today for symptoms of possible renal colic with her husband, Eddie Dibbles.    Her husband states that he is very frustrated with [place] as we are always putting her on the wrong antibiotic.  When asked what he means by this, he states we prescribe her one antibiotic and then call back with a different antibiotic.  I then explained to the patient the we are prescribing an antibiotic empirically and then adjusting the antibiotic due to the sensitivity results.  He still seemed frustrated.    She has a neurogenic bladder and is currently managing that with CIC.  She has had 2 positive urine culture within the last 2 months.  CT Renal stone study performed on 01/04/2017 noted bilateral nonobstructing nephrolithiasis. No evidence for ureteral or bladder stones. The right UVJ stone seen on the prior study has passed in the interval. KUB taken on 02/15/2017.   did not demonstrate any migration of her renal stones into the ureters.    Today, she is experiencing right flank pain.  She states it is pretty intense and feels like previous stones.  She is having associated intermittency, hesitancy and nausea.  She is not having dysuria, suprapubic pain or gross hematuria.  She is not having fevers, chills or vomiting.    She has also developed vaginal itching and discharge.  She and her husband state they have not been sexually active for the last month.    Her UA today is positive for 11-30 RBC's, moderate bacteria and calcium oxalate crystals.    PMH: Past Medical History:  Diagnosis Date  . Headache   . Migraines   . Renal disorder   . Vision abnormalities     Surgical History: Past Surgical History:  Procedure  Laterality Date  . ANTERIOR CRUCIATE LIGAMENT REPAIR  1997  . CYSTOSCOPY WITH STENT PLACEMENT Right 04/17/2016   Procedure: CYSTOSCOPY WITH STENT PLACEMENT;  Surgeon: [name], MD;  Location: [facility] ORS;  Service: Urology;  Laterality: Right;  . EXPLORATORY LAPAROTOMY  1999  . KIDNEY STONE SURGERY  04/2016  . TONSILLECTOMY      Home Medications:  Allergies as of 02/20/2017      Reactions   Aspirin Shortness Of Breath, Swelling, Anaphylaxis   Ibuprofen Shortness Of Breath, Swelling, Anaphylaxis      Medication List        Accurate as of 02/20/17 11:59 PM. Always use your most recent med list.          butalbital-acetaminophen-caffeine 50-325-40 MG tablet Commonly known as:  FIORICET, ESGIC Take by mouth.   cephALEXin 500 MG capsule Commonly known as:  KEFLEX Take 1 capsule (500 mg total) 2 (two) times daily by mouth.   HYDROmorphone 2 MG tablet Commonly known as:  DILAUDID Take 1 tablet (2 mg total) by mouth every 6 (six) hours as needed for severe pain.   promethazine 12.5 MG tablet Commonly known as:  PHENERGAN Take 1 tablet (12.5 mg total) by mouth every 4 (four) hours as needed for nausea or vomiting.   rizatriptan 5 MG tablet Commonly known as:  MAXALT Take 5 mg by mouth.   terconazole 0.4 % vaginal cream Commonly known as:  [place]  1 applicator at bedtime vaginally.   venlafaxine XR 37.5 MG 24 hr capsule Commonly known as:  EFFEXOR-XR Take 37.5 mg by mouth.       Allergies:  Allergies  Allergen Reactions  . Aspirin Shortness Of Breath, Swelling and Anaphylaxis  . Ibuprofen Shortness Of Breath, Swelling and Anaphylaxis    Family History: Family History  Problem Relation Age of Onset  . Hypertension Mother   . Atrial fibrillation Father   . Healthy Brother   . Arthritis/Rheumatoid Paternal Grandmother   . Healthy Brother     Social History:  reports that  has never smoked. she has never used smokeless tobacco. She reports that she  does not drink alcohol or use drugs.  ROS: UROLOGY Frequent Urination?: No Hard to postpone urination?: No Burning/pain with urination?: No Get up at night to urinate?: No Leakage of urine?: No Urine stream starts and stops?: Yes Trouble starting stream?: Yes Do you have to strain to urinate?: No Blood in urine?: No Urinary tract infection?: Yes Sexually transmitted disease?: No Injury to kidneys or bladder?: No Painful intercourse?: No Weak stream?: No Currently pregnant?: No Vaginal bleeding?: No Last menstrual period?: n  Gastrointestinal Nausea?: Yes Vomiting?: No Indigestion/heartburn?: No Diarrhea?: No Constipation?: No  Constitutional Fever: No Night sweats?: No Weight loss?: No Fatigue?: Yes  Skin Skin rash/lesions?: No Itching?: No  Eyes Blurred vision?: No Double vision?: No  Ears/Nose/Throat Sore throat?: No Sinus problems?: No  Hematologic/Lymphatic Swollen glands?: No Easy bruising?: No  Cardiovascular Leg swelling?: No Chest pain?: No  Respiratory Cough?: No Shortness of breath?: No  Endocrine Excessive thirst?: No  Musculoskeletal Back pain?: No Joint pain?: No  Neurological Headaches?: No Dizziness?: No  Psychologic Depression?: No Anxiety?: No  Physical Exam: BP 129/81 (BP Location: Left Arm, Patient Position: Sitting, Cuff Size: Normal)   Pulse 76   Ht _0  (1.702 m)   Wt 139 lb (63 kg)   BMI 21.77 kg/m   Constitutional: Well nourished. Alert and oriented, No acute distress. HEENT:  AT, moist mucus membranes. Trachea midline, no masses. Cardiovascular: No clubbing, cyanosis, or edema. Respiratory: Normal respiratory effort, no increased work of breathing. GI: Abdomen is soft, non tender, non distended, no abdominal masses. Liver and spleen not palpable.  No hernias appreciated.  Stool sample for occult testing is not indicated.   GU: Right CVA tenderness.  No bladder fullness or masses.  Normal external  genitalia, normal pubic hair distribution, no lesions. Adherent cottage cheese like discharge seen in introitus.  Normal urethral meatus, no lesions, no prolapse, no discharge.   No urethral masses, tenderness and/or tenderness. No bladder fullness, tenderness or masses. Normal vagina mucosa, good estrogen effect, no discharge, no lesions, good pelvic support, no cystocele or rectocele noted.  No cervical motion tenderness.  Uterus is freely mobile and non-fixed.  No adnexal/parametria masses or tenderness noted.  Anus and perineum are without rashes or lesions.    Skin: No rashes, bruises or suspicious lesions. Lymph: No cervical or inguinal adenopathy. Neurologic: Grossly intact, no focal deficits, moving all 4 extremities. Psychiatric: Normal mood and affect.  Laboratory Data: Lab Results  Component Value Date   WBC 8.9 04/16/2016   HGB 11.6 (L) 04/17/2016   HCT 34.0 (L) 04/17/2016   MCV 92.7 04/16/2016   PLT 374 04/16/2016    Lab Results  Component Value Date   CREATININE 0.69 04/16/2016    Lab Results  Component Value Date   HGBA1C 5.0 04/18/2016  Lab Results  Component Value Date   AST 18 04/16/2016   Lab Results  Component Value Date   ALT 29 04/16/2016    Urinalysis 11-30 RBC's.  Calcium oxalate.  Moderate bacteria.  See Epic.  I have reviewed the labs.   Pertinent Imaging: CLINICAL DATA:  Bilateral low abdominal pain especially on the right including right flank pain. History of kidney stones and stone removal.  EXAM: ABDOMEN - 1 VIEW  COMPARISON:  Noncontrast abdominal and pelvic CT scan of January 04, 2017  FINDINGS: The bowel gas pattern is normal. There is an approximately 2 by 5 mm calcification projecting over the mid upper pole of the right kidney. There are calcifications projecting over the mid upper pole of the left kidney with 1 measuring 1 x 5 mm. No ureteral or bladder stones are observed. There are phleboliths within the pelvis.  The bony structures exhibit no acute abnormality.  IMPRESSION: Bilateral kidney stones. No other abdominal abnormality is observed.   Electronically Signed   By: David  Martinique M.D.   On: 02/15/2017 13:24  I have independently reviewed the films.    Assessment & Plan:    1. Recurrent UTI's  - criteria for recurrent UTI has been met with 2 or more infections in 6 months or 3 or greater infections in one year   - Patient is instructed to increase their water intake until the urine is pale yellow or clear (10 to 12 cups daily)   - probiotics (yogurt, oral pills or vaginal suppositories), take cranberry pills or drink the juice and Vitamin C 1,000 mg daily to acidify the urine should be added to their daily regimen  - if using tampons, she should remove them prior to urinating and change them often   - avoid soaking in tubs and wipe front to back after urinating   - UA suspicious for infection - sending it for culture - started empirically on Keflex - will adjust if necessary once cultures are available   2.  Bilateral nephrolithiasis  - seen on CT and KUB  - will obtain a RUS to see if one of the stones has migrated into the right ureter  - Advised to contact our office or seek treatment in the ED if becomes febrile or pain/ vomiting are difficult control in order to arrange for emergent/urgent intervention  3. Yeast vaginitis  - prescribed Terazol cream  4. Right flank pain  - RUS pending                                         Return for pending RUS and culture results.  These notes generated with voice recognition software. I apologize for typographical errors.  Zara Council, [facility] [address], [place],  [address] [phone]

## 2017-02-20 ENCOUNTER — Ambulatory Visit: Payer: 59 | Admitting: Physical Therapy

## 2017-02-20 ENCOUNTER — Encounter: Payer: Self-pay | Admitting: Urology

## 2017-02-20 ENCOUNTER — Ambulatory Visit
Admission: RE | Admit: 2017-02-20 | Discharge: 2017-02-20 | Disposition: A | Discharge status: Home / Self Care | Payer: 59 | Source: Ambulatory Visit | Attending: Urology | Admitting: Urology

## 2017-02-20 ENCOUNTER — Ambulatory Visit (INDEPENDENT_AMBULATORY_CARE_PROVIDER_SITE_OTHER): Payer: 59 | Admitting: Urology

## 2017-02-20 VITALS — BP 129/81 | HR 76 | Ht 67.0 in | Wt 139.0 lb

## 2017-02-20 DIAGNOSIS — N390 Urinary tract infection, site not specified: Secondary | ICD-10-CM | POA: Diagnosis not present

## 2017-02-20 DIAGNOSIS — B373 Candidiasis of vulva and vagina: Secondary | ICD-10-CM

## 2017-02-20 DIAGNOSIS — B3731 Acute candidiasis of vulva and vagina: Secondary | ICD-10-CM

## 2017-02-20 DIAGNOSIS — N200 Calculus of kidney: Secondary | ICD-10-CM

## 2017-02-20 DIAGNOSIS — R109 Unspecified abdominal pain: Secondary | ICD-10-CM | POA: Diagnosis not present

## 2017-02-20 LAB — MICROSCOPIC EXAMINATION

## 2017-02-20 LAB — URINALYSIS, COMPLETE
Bilirubin, UA: NEGATIVE
Glucose, UA: NEGATIVE
Leukocytes, UA: NEGATIVE
Nitrite, UA: NEGATIVE
Specific Gravity, UA: >1.03 — ABNORMAL HIGH (ref 1.005–1.030)
Urobilinogen, Ur: 0.2 mg/dL (ref 0.2–1.0)
pH, UA: 5.5 (ref 5.0–7.5)

## 2017-02-20 MED ORDER — CEPHALEXIN 500 MG PO CAPS: 500.0000 mg | ORAL_CAPSULE | Freq: Two times a day (BID) | ORAL | 0 refills | Status: DC

## 2017-02-20 MED ORDER — TERCONAZOLE 0.4 % VA CREA: 1.0000 | TOPICAL_CREAM | Freq: Every day | VAGINAL | 0 refills | Status: DC

## 2017-02-22 ENCOUNTER — Ambulatory Visit: Payer: 59 | Admitting: Physical Therapy

## 2017-02-22 DIAGNOSIS — M6281 Muscle weakness (generalized): Secondary | ICD-10-CM | POA: Diagnosis not present

## 2017-02-22 DIAGNOSIS — R262 Difficulty in walking, not elsewhere classified: Secondary | ICD-10-CM

## 2017-02-23 ENCOUNTER — Telehealth: Payer: Self-pay

## 2017-02-23 LAB — CULTURE, URINE COMPREHENSIVE

## 2017-02-23 NOTE — Telephone Encounter (Signed)
Spoke with pt in reference to +ucx and abx. Pt voiced understanding.  

## 2017-02-23 NOTE — Therapy (Addendum)
[place] [facility] [address] [place], [place], [address] Phone: [phone]   Fax:  [phone]  Physical Therapy Treatment  Patient Details  Name: Christine Chavez MRN: [id number] Date of Birth: 09-Apr-1981 Referring Provider: Dr. Luevenia Maxin   Encounter Date: 02/22/2017  PT End of Session - 02/23/17 1732    Visit Number  17    Number of Visits  19    Date for PT Re-Evaluation  03/01/17    PT Start Time  2409    PT Stop Time  1110    PT Time Calculation (min)  55 min    Activity Tolerance  Patient tolerated treatment well;Patient limited by fatigue    Behavior During Therapy  [facility] for tasks assessed/performed       Past Medical History:  Diagnosis Date  . Headache   . Migraines   . Renal disorder   . Vision abnormalities     Past Surgical History:  Procedure Laterality Date  . ANTERIOR CRUCIATE LIGAMENT REPAIR  1997  . EXPLORATORY LAPAROTOMY  1999  . KIDNEY STONE SURGERY  04/2016  . TONSILLECTOMY      There were no vitals filed for this visit.  Subjective Assessment - 02/23/17 1731    Limitations  Lifting;Standing;Walking;House hold activities    Patient Stated Goals  ambulate with rollator walker    Currently in Pain?  Yes    Pain Score  3     Pain Location  Back    Pain Orientation  Lower       Pt. states X-ray revealed kidney stones are still in kidneys (not ureters) and have not moved.  Pt. is on 4th round of antibiotics due to bladder/kidney infection.  Pt. reports >3/10 low back pain currently at rest.       Treatment:  Neuro Re-education: Standing in // bars: 3-way hip resisted with yellow t-bandx 10 each LE of hip flexion, abd, ext Toe tap on step at 5" and 10" alternating (cones) with single UE assist x 10 each LE, and x 10 with no UE assit x 10 each LE with CGA and frequent minA High marching with 2.5# ankle weight Forward/backwardwalking4  lapswith cues for larger step and weight shift onto stance leg              Gait in PT clinic with no assistive device.  PT tactile assist with L/R hip rotn. During step pattern.  Ther-ex: Sitting: LAQ with 2.5# ankle weight x 20 each LE March with 2.5# ankle weights x 20 each LE Supine: Quad/hip flexor stretchusing muscle energy technique for contract-relax stretch, 5 sec contract and 20 sec relax x 3 iterations each side Sit to standing instruct.    Cool-down: Scifit [id number]/BLE (un-billed)(pt had stop due to R hip pain/muscle spasm)     Pt. very fatigued with standing/ walking tasks during PT tx. session.  Persistent low back pain during tx. but never worsening or limiting during tx. session.  PT focus on dynamic standing/ balance tasks.  Pt. able to ambulate in PT clinic without rollator and PT assist for control L/R hip rotn. with step pattern/ wt. shifting.       PT Long Term Goals - 02/01/17 1122      PT LONG TERM GOAL #1   Title  Pt will report ability to ambulate in the community without the use of a wheelchair to allow patient to participate in desired activities    Baseline  Pt able  to walk limited community distances with Rollator and intermittent SBA depending on fatigue level    Time  4    Period  Weeks    Status  Partially Met    Target Date  03/01/17      PT LONG TERM GOAL #2   Title  Pt. will increase Berg balance test to >55/56 to improve gait with least restrictive device/ promote decrease fall risk.      Baseline  Berg 52/56 on 12/04/16    Time  4    Period  Weeks    Status  On-going    Target Date  03/01/17      PT LONG TERM GOAL #3   Title  Pt. will increase LEFS score to >45 out of 80 to improve functional mobility.      Baseline  LEFS: 36 out of 80 on 12/04/16.  LEFS: 36 out of 80.  No change.    Time  4    Period  Weeks    Status  On-going    Target Date  03/01/17      PT LONG TERM GOAL #4   Title   Pt will report ability to work in her yard for greater than 30 minutes without extreme fatigue    Baseline  limited to 10-15 minutes before pt requires ending activity    Time  4    Period  Weeks    Status  Partially Met    Target Date  03/01/17            Plan - 02/23/17 1734    Clinical Decision Making  Moderate    Rehab Potential  Fair    PT Frequency  2x / week    PT Duration  4 weeks    PT Treatment/Interventions  ADLs/Self Care Home Management;Gait training;Stair training;Functional mobility training;Therapeutic activities;Therapeutic exercise;Balance training;Patient/family education;Neuromuscular re-education;Manual techniques;Wheelchair mobility training;Passive range of motion;Energy conservation    PT Next Visit Plan  Progress tall kneeling/ balance tasks.      PT Home Exercise Plan  Continued resisted 4-way hip; Continued with TKE, and standing 4-way hip AROM with BUE assist, and mini-squat to bed/chair.       Patient will benefit from skilled therapeutic intervention in order to improve the following deficits and impairments:  Abnormal gait, Improper body mechanics, Pain, Postural dysfunction, Decreased mobility, Decreased coordination, Decreased endurance, Decreased activity tolerance, Decreased range of motion, Decreased strength, Decreased safety awareness, Decreased balance, Difficulty walking  Visit Diagnosis: Muscle weakness (generalized)  Difficulty in walking, not elsewhere classified     Problem List Patient Active Problem List   Diagnosis Date Noted  . Nephrolithiasis 04/16/2016  . Numbness 07/28/2015  . Bladder retention 06/23/2015  . Abdominal pain 06/04/2015  . Dizziness 05/18/2015  . Neck pain 05/18/2015  . Complicated migraine 91/63/8466  . Other fatigue 04/28/2015  . Abnormal finding on MRI of brain 04/28/2015  . D (diarrhea) 03/29/2015  . H/O disease 03/29/2015  . Abnormal weight loss 03/29/2015  . Muscle weakness (generalized)  03/14/2015  . Headache, migraine 03/10/2015   [name], PT, DPT # [phone] 02/23/2017, 5:35 PM  [facility] [address] [place], [place], [address] Phone: [phone]   Fax:  [phone]  Name: Christine Chavez MRN: [id number] Date of Birth: 01/13/1981

## 2017-02-23 NOTE — Telephone Encounter (Signed)
-----   Message from Harle BattiestShannon A McGowan, PA-C sent at 02/23/2017 11:57 AM EST ----- Please let [place] know that her urine culture is positive, but the Keflex is the appropriate antibiotic.

## 2017-02-27 ENCOUNTER — Telehealth: Payer: Self-pay

## 2017-02-27 ENCOUNTER — Ambulatory Visit: Payer: 59 | Admitting: Physical Therapy

## 2017-02-27 DIAGNOSIS — N390 Urinary tract infection, site not specified: Secondary | ICD-10-CM

## 2017-02-27 NOTE — Telephone Encounter (Signed)
Has she seen infectious disease?  If not, we need to refer her to an ID specialist.

## 2017-02-27 NOTE — Telephone Encounter (Signed)
Pt called stating that she has been in the bed all day yesterday and today with the same pain that she has been having. Pt stated that she finished her keflex today. Pt voiced concern about abx not working. Pt wanted to try a stronger abx. Please advise.

## 2017-02-28 NOTE — Telephone Encounter (Signed)
Spoke with pt in reference to ID referral. Pt voiced understanding. Orders placed.

## 2017-03-01 ENCOUNTER — Encounter: Payer: Self-pay | Admitting: Physical Therapy

## 2017-03-01 ENCOUNTER — Ambulatory Visit: Payer: 59 | Admitting: Physical Therapy

## 2017-03-01 ENCOUNTER — Telehealth: Payer: Self-pay | Admitting: Urology

## 2017-03-01 DIAGNOSIS — M6281 Muscle weakness (generalized): Secondary | ICD-10-CM

## 2017-03-01 DIAGNOSIS — R262 Difficulty in walking, not elsewhere classified: Secondary | ICD-10-CM

## 2017-03-01 NOTE — Telephone Encounter (Signed)
Pt called office stating that you instructed her to call you back if infectious disease could not see her soon.  Her appt with Infectious Disease is on Dec 7th,  Pt is calling you back per your instruction asking what she should do in the mean time. Please advise. Thanks

## 2017-03-01 NOTE — Therapy (Addendum)
[facility] [address] [place], [place], [address] Phone: [phone]   Fax:  [phone]  Physical Therapy Treatment  Patient Details  Name: Christine Chavez MRN: [id number] Date of Birth: February 16, 1981 Referring Provider: Dr. Luevenia Maxin   Encounter Date: 03/01/2017  PT End of Session - 03/01/17 1232    Visit Number  18    Number of Visits  27    Date for PT Re-Evaluation  03/29/17    PT Start Time  1016    PT Stop Time  1123    PT Time Calculation (min)  67 min    Activity Tolerance  Patient tolerated treatment well;Patient limited by fatigue;Patient limited by pain    Behavior During Therapy  [facility] for tasks assessed/performed       Past Medical History:  Diagnosis Date  . Headache   . Migraines   . Renal disorder   . Vision abnormalities     Past Surgical History:  Procedure Laterality Date  . ANTERIOR CRUCIATE LIGAMENT REPAIR  1997  . CYSTOSCOPY WITH STENT PLACEMENT Right 04/17/2016   Performed by [name], MD at [facility] ORS  . EXPLORATORY LAPAROTOMY  1999  . KIDNEY STONE SURGERY  04/2016  . TONSILLECTOMY      There were no vitals filed for this visit.  Subjective Assessment - 03/01/17 1025    Subjective  Pt presents to therapy feeling "pretty crummy". Pt is waiting for consult with infectious disease specialist to treat ongoing kidney infection.    Limitations  Lifting;Standing;Walking;House hold activities    Patient Stated Goals  ambulate with rollator walker    Currently in Pain?  Yes    Pain Score  5     Pain Location  Back    Pain Orientation  Right;Lower    Pain Descriptors / Indicators  Aching    Pain Type  Chronic pain    Pain Onset  More than a month ago    Pain Frequency  Intermittent    Aggravating Factors   Kidney pain     Pain Relieving Factors  Heating pad    Effect of Pain on Daily Activities  decreased activity    Multiple Pain Sites  No        Treatment:  Gait:  Gait in  // bars x 4 laps with no AD, CGA for safety; cues for increased knee ext in stance, heel strike, and increased stride length. Pt with progressively flexed posture with increased fatigue.   No AD X 80' with manual cues for increased pelvic rotation; pt's stride length improved with manual cues; pt still with flexed knees/hips in stance.  Neuro Re-education:   March with no UE assist 2 x 20 steps with 2.5# ankle weights; pt with multiple lateral and posterior LOB requiring minA.   Sidestep with 2.5# ankle weights x 4 laps, no UE assist, LOB x 1 Kneeling:  Tall kneeling/ half kneeling each side with weighted ball pass x 10-15 min; cues to increase hip strategy  Standing:  Weighted ball pass x 10 each side (limited hip strategy)  Ther-ex:  Total gym B knee flexion/ heel raises (difficulty)- moderate B LE muscle fasciculations.    Reviewed HEP in depth.        Pt. has had a difficult time with low back pain/ kidney infection over past several weeks.  Pts. overall progress has remained limited due to recent set backs and difficulty completing walking/ HEP.  Pt. has  missed [place] numerous times due to kidney related pain/ nausea.  Pt. remains on her 4th anitbiotic to combat the infection.  Pt's primary limitation is B LE muscle strength/ fatigue/ balance/ coordination.  Pt is continuing to seek treatment at [facility] clinic or [facility] per Neurologist's recommendation.  Pt. will benefit from continued PT to benefit B LE/ generalized strengthening to improve walking/ balance with least assistive device.       PT Education - 03/01/17 1232    Education provided  Yes    Education Details  Plan of care, alance training    Person(s) Educated  Patient    Methods  Explanation;Demonstration;Tactile cues;Verbal cues    Comprehension  Verbalized understanding;Returned demonstration;Verbal cues required;Tactile cues required          PT Long Term Goals - 03/01/17 1237      PT LONG TERM GOAL #1   Title  Pt  will report ability to ambulate in the community without the use of a wheelchair to allow patient to participate in desired activities    Baseline  Pt able to walk household distances with Rollator with supervision; when fatigued pt requires SBA with short distances ~50'    Time  4    Period  Weeks    Status  Partially Met    Target Date  03/29/17      PT LONG TERM GOAL #2   Title  Pt. will increase Berg balance test to >55/56 to improve gait with least restrictive device/ promote decrease fall risk.      Baseline  Berg 52/56 on 12/04/16    Time  4    Period  Weeks    Status  On-going    Target Date  03/29/17      PT LONG TERM GOAL #3   Title  Pt. will increase LEFS score to >45 out of 80 to improve functional mobility.      Baseline  LEFS: 36 out of 80 on 12/04/16.  LEFS: 36 out of 80.  No change.    Time  4    Period  Weeks    Status  On-going    Target Date  03/29/17      PT LONG TERM GOAL #4   Title  Pt will report ability to work in her yard for greater than 30 minutes without extreme fatigue    Baseline  limited to 10-15 minutes before pt requires ending activity    Time  4    Period  Weeks    Status  On-going    Target Date  03/29/17         Plan - 03/01/17 1257    Clinical Impression          Clinical Presentation  Pt's goals re-assessed today with limited progress. Pt currently has kidney infection with LBP and fatigue. She presents with decreased activity tolerance. Pt continues to ambulate with Rollator with supervision for short distances. When fatigued she requires SBA to ambulate short distances. Recommend continued therapy with focus on dynamic balance and strengthening to prevent further functional decline.   Stable   Clinical Decision Making  Moderate    Rehab Potential  Fair    PT Frequency  2x / week    PT Duration  4 weeks    PT Treatment/Interventions  ADLs/Self Care Home Management;Gait training;Stair training;Functional mobility  training;Therapeutic activities;Therapeutic exercise;Balance training;Patient/family education;Neuromuscular re-education;Manual techniques;Wheelchair mobility training;Passive range of motion;Energy conservation    PT Next Visit Plan  Continue  tall kneeling/ balance tasks, genral strengthening     PT Home Exercise Plan  Continued resisted 4-way hip; Continued with TKE, and standing 4-way hip AROM with BUE assist, and mini-squat to bed/chair.    Consulted and Agree with Plan of Care  Patient       Patient will benefit from skilled therapeutic intervention in order to improve the following deficits and impairments:  Abnormal gait, Improper body mechanics, Pain, Postural dysfunction, Decreased mobility, Decreased coordination, Decreased endurance, Decreased activity tolerance, Decreased range of motion, Decreased strength, Decreased safety awareness, Decreased balance, Difficulty walking  Visit Diagnosis: Muscle weakness (generalized)  Difficulty in walking, not elsewhere classified     Problem List Patient Active Problem List   Diagnosis Date Noted  . Nephrolithiasis 04/16/2016  . Numbness 07/28/2015  . Bladder retention 06/23/2015  . Abdominal pain 06/04/2015  . Dizziness 05/18/2015  . Neck pain 05/18/2015  . Complicated migraine 88/33/7445  . Other fatigue 04/28/2015  . Abnormal finding on MRI of brain 04/28/2015  . D (diarrhea) 03/29/2015  . H/O disease 03/29/2015  . Abnormal weight loss 03/29/2015  . Muscle weakness (generalized) 03/14/2015  . Headache, migraine 03/10/2015   [name], PT, DPT # [phone] 03/02/2017, 2:09 PM  [facility] [address] [place], [place], [address] Phone: [phone]   Fax:  [phone]  Name: Christine Chavez MRN: [id number] Date of Birth: 1981/01/31

## 2017-03-02 NOTE — Telephone Encounter (Signed)
Spoke with pt in reference to contacting PCP or neurology. Pt stated she has not reached out but will. Made pt aware can bring in a cath specimen. A nurse visit was made for Monday. Pt voiced understanding.

## 2017-03-02 NOTE — Telephone Encounter (Signed)
Where is her pain at this time?  Does she have a fever?  Is she having vomiting?  Does she have any urinary symptoms (dysuria, suprapubic pain, etc.)?

## 2017-03-02 NOTE — Telephone Encounter (Signed)
Spoke with pt in reference to concerns about waiting until Dec 7 to see ID. Pt states that is she still rather miserable and in a lot of pain. Pt is wanting to know what to do now. Please advise.

## 2017-03-02 NOTE — Telephone Encounter (Signed)
We have performed three imaging studies and they all confirm that the stones have not migrated.  Has she reached out to her PCP or neurologist to see if they have any input?  She may also need to bring us a cath ua for culture to ensure her UTI has resolved.

## 2017-03-02 NOTE — Telephone Encounter (Signed)
Pain is in her back/flank area mostly. Pt states that once in a while she will have burning with performing CIC. No other UTI or urinary s/s.

## 2017-03-03 ENCOUNTER — Encounter: Payer: Self-pay | Admitting: Emergency Medicine

## 2017-03-03 ENCOUNTER — Emergency Department
Admission: EM | Admit: 2017-03-03 | Discharge: 2017-03-04 | Disposition: A | Discharge status: Home / Self Care | Payer: 59 | Attending: Emergency Medicine | Admitting: Emergency Medicine

## 2017-03-03 DIAGNOSIS — R1011 Right upper quadrant pain: Secondary | ICD-10-CM | POA: Diagnosis not present

## 2017-03-03 DIAGNOSIS — R102 Pelvic and perineal pain: Secondary | ICD-10-CM | POA: Insufficient documentation

## 2017-03-03 DIAGNOSIS — Z79899 Other long term (current) drug therapy: Secondary | ICD-10-CM | POA: Diagnosis not present

## 2017-03-03 DIAGNOSIS — R109 Unspecified abdominal pain: Secondary | ICD-10-CM

## 2017-03-03 DIAGNOSIS — R319 Hematuria, unspecified: Secondary | ICD-10-CM

## 2017-03-03 LAB — COMPREHENSIVE METABOLIC PANEL
ALT: 15 U/L (ref 14–54)
AST: 17 U/L (ref 15–41)
Albumin: 4.3 g/dL (ref 3.5–5.0)
Alkaline Phosphatase: 51 U/L (ref 38–126)
Anion gap: 8 (ref 5–15)
BUN: 11 mg/dL (ref 6–20)
CO2: 24 mmol/L (ref 22–32)
Calcium: 9 mg/dL (ref 8.9–10.3)
Chloride: 104 mmol/L (ref 101–111)
Creatinine, Ser: 0.73 mg/dL (ref 0.44–1.00)
GFR calc Af Amer: >60 mL/min (ref >60)
GFR calc non Af Amer: >60 mL/min (ref >60)
Glucose, Bld: 88 mg/dL (ref 65–99)
Potassium: 3.3 mmol/L — ABNORMAL LOW (ref 3.5–5.1)
Sodium: 136 mmol/L (ref 135–145)
Total Bilirubin: 1.7 mg/dL — ABNORMAL HIGH (ref 0.3–1.2)
Total Protein: 7.2 g/dL (ref 6.5–8.1)

## 2017-03-03 LAB — URINALYSIS, COMPLETE (UACMP) WITH MICROSCOPIC
Bacteria, UA: NONE SEEN
Bilirubin Urine: NEGATIVE
Glucose, UA: NEGATIVE mg/dL
Ketones, ur: 20 mg/dL — AB
Leukocytes, UA: NEGATIVE
Nitrite: NEGATIVE
Protein, ur: 30 mg/dL — AB
Specific Gravity, Urine: 1.029 (ref 1.005–1.030)
pH: 5 (ref 5.0–8.0)

## 2017-03-03 LAB — CBC
HCT: 38.7 % (ref 35.0–47.0)
Hemoglobin: 13.5 g/dL (ref 12.0–16.0)
MCH: 32.3 pg (ref 26.0–34.0)
MCHC: 34.8 g/dL (ref 32.0–36.0)
MCV: 92.8 fL (ref 80.0–100.0)
Platelets: 304 10*3/uL (ref 150–440)
RBC: 4.17 MIL/uL (ref 3.80–5.20)
RDW: 12.8 % (ref 11.5–14.5)
WBC: 8.1 10*3/uL (ref 3.6–11.0)

## 2017-03-03 NOTE — ED Notes (Signed)
Pt self catheterizes for urine.

## 2017-03-03 NOTE — ED Notes (Signed)
Report from alicia, rn.  

## 2017-03-03 NOTE — ED Notes (Signed)
Pt updated on delay and repositioned in bed for comfort.

## 2017-03-03 NOTE — ED Triage Notes (Signed)
Pt states "I have a kidney infection". Pt complains of back pain, pelvic pain and hematuria. Pt states she is seeing a urologist for same, has had 4 rounds of antibiotics and is now to see infectious disease for possible antibiotic resistance. Pt appears in no acute distress.

## 2017-03-04 ENCOUNTER — Emergency Department: Payer: 59

## 2017-03-04 LAB — DIFFERENTIAL
Basophils Absolute: 0.1 10*3/uL (ref 0–0.1)
Basophils Relative: 1 %
Eosinophils Absolute: 0.1 10*3/uL (ref 0–0.7)
Eosinophils Relative: 1 %
Lymphocytes Relative: 39 %
Lymphs Abs: 3.3 10*3/uL (ref 1.0–3.6)
Monocytes Absolute: 0.6 10*3/uL (ref 0.2–0.9)
Monocytes Relative: 7 %
Neutro Abs: 4.3 10*3/uL (ref 1.4–6.5)
Neutrophils Relative %: 52 %

## 2017-03-04 LAB — PREGNANCY, URINE: Preg Test, Ur: NEGATIVE

## 2017-03-04 MED ORDER — ONDANSETRON 4 MG PO TBDP
ORAL_TABLET | ORAL | Status: AC
  Filled 2017-03-04: qty 1

## 2017-03-04 MED ORDER — OXYCODONE-ACETAMINOPHEN 5-325 MG PO TABS
1.0000 | ORAL_TABLET | Freq: Once | ORAL | Status: AC
  Administered 2017-03-04: 1 via ORAL
  Filled 2017-03-04: qty 1

## 2017-03-04 MED ORDER — ONDANSETRON 4 MG PO TBDP
4.0000 mg | ORAL_TABLET | Freq: Once | ORAL | Status: AC
  Administered 2017-03-04: 4 mg via ORAL
  Filled 2017-03-04: qty 1

## 2017-03-04 MED ORDER — ONDANSETRON 4 MG PO TBDP
4.0000 mg | ORAL_TABLET | Freq: Once | ORAL | Status: AC
  Administered 2017-03-04: 4 mg via ORAL

## 2017-03-04 MED ORDER — ONDANSETRON 4 MG PO TBDP: 4.0000 mg | ORAL_TABLET | Freq: Once | ORAL | Status: DC

## 2017-03-04 MED ORDER — OXYCODONE-ACETAMINOPHEN 5-325 MG PO TABS
1.0000 | ORAL_TABLET | Freq: Once | ORAL | Status: AC
  Administered 2017-03-04: 1 via ORAL

## 2017-03-04 MED ORDER — DOXYCYCLINE HYCLATE 100 MG PO TABS
100.0000 mg | ORAL_TABLET | Freq: Once | ORAL | Status: AC
  Administered 2017-03-04: 100 mg via ORAL
  Filled 2017-03-04: qty 1

## 2017-03-04 MED ORDER — OXYCODONE-ACETAMINOPHEN 5-325 MG PO TABS
ORAL_TABLET | ORAL | Status: AC
  Filled 2017-03-04: qty 1

## 2017-03-04 MED ORDER — ONDANSETRON HCL 4 MG/2ML IJ SOLN
4.0000 mg | Freq: Once | INTRAMUSCULAR | Status: DC
  Filled 2017-03-04: qty 2

## 2017-03-04 NOTE — ED Provider Notes (Signed)
[facility] Emergency Department Provider Note   ____________________________________________   First MD Initiated Contact with Patient 03/03/17 2319     (approximate)  I have reviewed the triage vital signs and the nursing notes.   HISTORY  Chief Complaint Back Pain and Pelvic Pain    HPI Christine Chavez is a 36 y.o. female Patient with repeated episodes of hematuria and symptoms like UTI she has pain in her kidneys it sometimes radiates down into the abdomen. She is having another episode like that today. She is having some hematuria.and right sided flank pain rating down to the groin it is worse than her usual pain with infection. Pain is been going on for over a moderately severe achy percussion of theCVA area makes it worse. patient is waiting for a an appointment with the [facility] to evaluate her MRI that showed white matter changes in the brain and weakness in the muscles or numbness in the muscles especially the legs.   Past Medical History:  Diagnosis Date  . Headache   . Migraines   . Renal disorder   . Vision abnormalities     Patient Active Problem List   Diagnosis Date Noted  . Nephrolithiasis 04/16/2016  . Numbness 07/28/2015  . Bladder retention 06/23/2015  . Abdominal pain 06/04/2015  . Dizziness 05/18/2015  . Neck pain 05/18/2015  . Complicated migraine 04/28/2015  . Other fatigue 04/28/2015  . Abnormal finding on MRI of brain 04/28/2015  . D (diarrhea) 03/29/2015  . H/O disease 03/29/2015  . Abnormal weight loss 03/29/2015  . Muscle weakness (generalized) 03/14/2015  . Headache, migraine 03/10/2015    Past Surgical History:  Procedure Laterality Date  . ANTERIOR CRUCIATE LIGAMENT REPAIR  1997  . CYSTOSCOPY WITH STENT PLACEMENT Right 04/17/2016   Performed by Malen Gauze, MD at [facility] ORS  . EXPLORATORY LAPAROTOMY  1999  . KIDNEY STONE SURGERY  04/2016  . TONSILLECTOMY      Prior to Admission medications     Medication Sig Start Date End Date Taking? Authorizing Provider  butalbital-acetaminophen-caffeine (FIORICET, ESGIC) 50-325-40 MG tablet Take by mouth. 09/21/16   [provider]  cephALEXin (KEFLEX) 500 MG capsule Take 1 capsule (500 mg total) 2 (two) times daily by mouth. 02/20/17   Michiel Cowboy A, PA-C  HYDROmorphone (DILAUDID) 2 MG tablet Take 1 tablet (2 mg total) by mouth every 6 (six) hours as needed for severe pain. 01/02/17   Alfredo Martinez, MD  promethazine (PHENERGAN) 12.5 MG tablet Take 1 tablet (12.5 mg total) by mouth every 4 (four) hours as needed for nausea or vomiting. 01/19/17   Hildred Laser, MD  rizatriptan (MAXALT) 5 MG tablet Take 5 mg by mouth. 09/21/16   [provider]  terconazole (TERAZOL 7) 0.4 % vaginal cream Place 1 applicator at bedtime vaginally. 02/20/17   Michiel Cowboy A, PA-C  venlafaxine XR (EFFEXOR-XR) 37.5 MG 24 hr capsule Take 37.5 mg by mouth. 09/21/16 09/21/17  [provider]    Allergies Aspirin and Ibuprofen  Family History  Problem Relation Age of Onset  . Hypertension Mother   . Atrial fibrillation Father   . Healthy Brother   . Arthritis/Rheumatoid Paternal Grandmother   . Healthy Brother     Social History Social History   Tobacco Use  . Smoking status: Never Smoker  . Smokeless tobacco: Never Used  Substance Use Topics  . Alcohol use: No    Alcohol/week: 0.0 oz  . Drug use:  No    Review of Systems  Constitutional: No fever/chills Eyes: No visual changes. ENT: No sore throat. Cardiovascular: Denies chest pain. Respiratory: Denies shortness of breath. Gastrointestinal:abdominal pain.  No nausea, no vomiting.  No diarrhea.  No constipation. Genitourinary: Negative for dysuria. Musculoskeletal: Negative for back pain.but she does have pain in the right CVA areaSkin: Negative for rash.   ____________________________________________   PHYSICAL EXAM:  VITAL SIGNS: ED Triage Vitals [03/03/17  2128]  Enc Vitals Group     BP (!) 130/95     Pulse Rate 96     Resp 16     Temp 98.4 F (36.9 C)     Temp Source Oral     SpO2 98 %     Weight 137 lb (62.1 kg)     Height 5\' 7"  (1.702 m)     Head Circumference      Peak Flow      Pain Score 7     Pain Loc      Pain Edu?      Excl. in GC?     Constitutional: Alert and oriented. Well appearing . Eyes: Conjunctivae are normal.  Head: Atraumatic. Nose: No congestion/rhinnorhea. Mouth/Throat: Mucous membranes are moist.  Oropharynx non-erythematous. Neck: No stridor. Cardiovascular: Normal rate, regular rhythm. Grossly normal heart sounds.  Good peripheral circulation. Respiratory: Normal respiratory effort.  No retractions. Lungs CTAB. Gastrointestinal: Soft tender to right CVA area andradiates to right lower quadrant. No distention. No abdominal bruits. No CVA tenderness. Musculoskeletal: No lower extremity tenderness nor edema.  No joint effusions. Neurologic:  Normal speech and language. No gross focal neurologic deficits are appreciated. No gait instability. Skin:  Skin is warm, dry and intact. No rash noted. Psychiatric: Mood and affect are normal. Speech and behavior are normal.  ____________________________________________   LABS (all labs ordered are listed, but only abnormal results are displayed)  Labs Reviewed  COMPREHENSIVE METABOLIC PANEL - Abnormal; Notable for the following components:      Result Value   Potassium 3.3 (*)    Total Bilirubin 1.7 (*)    All other components within normal limits  URINALYSIS, COMPLETE (UACMP) WITH MICROSCOPIC - Abnormal; Notable for the following components:   Color, Urine AMBER (*)    APPearance CLEAR (*)    Hgb urine dipstick MODERATE (*)    Ketones, ur 20 (*)    Protein, ur 30 (*)    Squamous Epithelial / LPF 0-5 (*)    All other components within normal limits  URINE CULTURE  CBC  PREGNANCY, URINE  DIFFERENTIAL    ____________________________________________  EKG   ____________________________________________  RADIOLOGY  ultrasound shows only the stone in the kidney. As no obstruction _______________________  pelvic ultrasound is also read as normal by radiology_____________________   PROCEDURES  Procedure(s) performed:   Procedures  Critical Care performed:   ____________________________________________   INITIAL IMPRESSION / ASSESSMENT AND PLAN / ED COURSE  As part of my medical decision making, I reviewed the following data within the electronic MEDICAL RECORD NUMBERrecords reviewed labs reviewed patient's white count is at the range it usually is 8. The pain radiates to the right lower quadrant especially the suprapubic area from the right CVA area does not sound like there is any appendicitis going on. patient has already had 3 CT scans of her abdomen and pelvis and multiple x-rays. in view of the lab work and history I do not think she is having appendicitis at this time. I will treat her  with antibiotics will return if she is worse especially if she runs a fever has any vomiting or pain localizes to the right lower quadrant.   patient reports she saw her OB/GYN doctor recently but since then has had a menstrual period with black Play-Doh-like material coming out. She is having some pelvic pain as well as the right flank pain. He will go ahead and an order pelvic ultrasound to just to make sure there is nothing unusual going on there on reexam her abdomen is now more diffusely tender sort of a pressure-like pain she says.  __pelvic ultrasound is normal I will discharge the patient__________________________________________   FINAL CLINICAL IMPRESSION(S) / ED DIAGNOSES  Final diagnoses:  Hematuria, unspecified type  Right flank pain     ED Discharge Orders    None       Note:  This document was prepared using Dragon voice recognition software and may include unintentional  dictation errors.    Arnaldo NatalMalinda, Haddy Mullinax F, MD 03/04/17 [id number]

## 2017-03-04 NOTE — Discharge Instructions (Signed)
try the doxycycline one pill twice a day. Follow up with your urologist. As the urologist about something to help prevent more stones from forming and see if they are considering getting rid of the stone that you have. It may represent a source for continued infection. Please be sure to return for fever vomiting or if the pain localizes to the right lower abdomen.it doesn't look like you have appendicitis at the present time but that could sometimes develop.

## 2017-03-04 NOTE — ED Notes (Signed)
Pt returned from ultrasound

## 2017-03-04 NOTE — ED Notes (Signed)
Pt updated on ultrasound process. Pt verbalizes understanding.  

## 2017-03-05 ENCOUNTER — Telehealth: Payer: Self-pay

## 2017-03-05 ENCOUNTER — Ambulatory Visit (INDEPENDENT_AMBULATORY_CARE_PROVIDER_SITE_OTHER): Payer: 59

## 2017-03-05 ENCOUNTER — Encounter: Payer: Self-pay | Admitting: Physical Therapy

## 2017-03-05 VITALS — BP 121/88 | HR 106 | Ht 67.0 in | Wt 135.0 lb

## 2017-03-05 DIAGNOSIS — N390 Urinary tract infection, site not specified: Secondary | ICD-10-CM

## 2017-03-05 LAB — URINALYSIS, COMPLETE
Bilirubin, UA: NEGATIVE
Glucose, UA: NEGATIVE
Leukocytes, UA: NEGATIVE
Nitrite, UA: NEGATIVE
Specific Gravity, UA: >1.03 — ABNORMAL HIGH (ref 1.005–1.030)
Urobilinogen, Ur: 1 mg/dL (ref 0.2–1.0)
pH, UA: 5.5 (ref 5.0–7.5)

## 2017-03-05 LAB — MICROSCOPIC EXAMINATION: WBC, UA: NONE SEEN /hpf (ref 0–?)

## 2017-03-05 NOTE — Telephone Encounter (Signed)
When pt was in office today providing a cath specimen for recheck of urine she stated that she had to go to the ER over the weekend. Pt stated that Saturday morning she started having gross hematuria and continues to have it. Pt stated that the ER told her, her kidney stones were infected and gave her doxycycline. Pt stated that she has not started abx yet as she wanted to check with BUA first. Pt inquired about getting in to see ID sooner than 12/7. I have left a message for Brandi, Dr. Myrtis HoppingFitzgeralds nurse. Please advise.

## 2017-03-05 NOTE — Progress Notes (Signed)
Pt presents today with c/o blood in urine, flank pain, nausea, and chills. Pt provided a cath specimen for u/a and cx.   Blood pressure 121/88, pulse (!) 106, height 5\' 7"  (1.702 m), weight 135 lb (61.2 kg), last menstrual period 02/23/2017.

## 2017-03-05 NOTE — Addendum Note (Signed)
Addended by: Cammie McgeeSHERK, Tennie Grussing C on: 03/05/2017 09:23 AM   Modules accepted: Orders

## 2017-03-05 NOTE — Telephone Encounter (Signed)
If her kidney stones were infected, we typically see high fevers.  Has she been having high fevers?  I'm wondering if the CIC is contributing to her infections.  We cannot operate on her stones until her infections are cleared.  Please find out what type of self cath she is using and the technique.

## 2017-03-06 ENCOUNTER — Other Ambulatory Visit: Payer: Self-pay | Admitting: Urology

## 2017-03-06 DIAGNOSIS — N390 Urinary tract infection, site not specified: Secondary | ICD-10-CM

## 2017-03-06 LAB — URINE CULTURE: Culture: 40000 — AB

## 2017-03-06 NOTE — Telephone Encounter (Signed)
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 Pt voiced understanding.  Pt was very upset and [facility].

## 2017-03-06 NOTE — Progress Notes (Signed)
I have placed orders for a referral to [facility] Achilles [facility]unkope [facility] [place].

## 2017-03-07 ENCOUNTER — Telehealth: Payer: Self-pay

## 2017-03-07 NOTE — Telephone Encounter (Signed)
Spoke with pt in reference to ucx results of ER and BUA. Pt voiced understanding.

## 2017-03-07 NOTE — Telephone Encounter (Signed)
-----   Message from Harle BattiestShannon A McGowan, PA-C sent at 03/07/2017  1:25 PM EST ----- Please let Borghild know that the culture from the ED was positive.  Our culture is still pending, but the preliminary report is negative.

## 2017-03-07 NOTE — Progress Notes (Signed)
ED CULTURE REPORT  Results for orders placed or performed during the hospital encounter of 03/03/17  Urine culture     Status: Abnormal   Collection Time: 03/03/17 10:16 PM  Result Value Ref Range Status   Specimen Description URINE, CLEAN CATCH  Final   Special Requests NONE  Final   Culture 40,000 COLONIES/mL KLEBSIELLA PNEUMONIAE (A)  Final   Report Status 03/06/2017 FINAL  Final   Organism ID, Bacteria KLEBSIELLA PNEUMONIAE (A)  Final      Susceptibility   Klebsiella pneumoniae - MIC*    AMPICILLIN RESISTANT Resistant     CEFAZOLIN <=4 SENSITIVE Sensitive     CEFTRIAXONE <=1 SENSITIVE Sensitive     CIPROFLOXACIN <=0.25 SENSITIVE Sensitive     GENTAMICIN <=1 SENSITIVE Sensitive     IMIPENEM <=0.25 SENSITIVE Sensitive     NITROFURANTOIN 32 SENSITIVE Sensitive     TRIMETH/SULFA <=20 SENSITIVE Sensitive     AMPICILLIN/SULBACTAM 4 SENSITIVE Sensitive     PIP/TAZO <=4 SENSITIVE Sensitive     Extended ESBL NEGATIVE Sensitive     * 40,000 COLONIES/mL KLEBSIELLA PNEUMONIAE   Per patient notes 11/21, Rupert Stackshelsea Watkins LPN [facility] is aware of and has informed patient of urine culture results. Pharmacy will leave decision to treat to [facility] as they are aware of cultures.   Yolanda BonineHannah Dierdra Salameh, PharmD Pharmacy Resident

## 2017-03-08 LAB — CULTURE, URINE COMPREHENSIVE

## 2017-03-12 ENCOUNTER — Telehealth: Payer: Self-pay

## 2017-03-12 NOTE — Telephone Encounter (Signed)
-----   Message from Harle BattiestShannon A McGowan, PA-C sent at 03/09/2017  4:24 PM EST ----- Please let Icela know that her urine culture is negative.

## 2017-03-12 NOTE — Telephone Encounter (Signed)
Will send a letter

## 2017-03-15 ENCOUNTER — Ambulatory Visit: Payer: 59 | Admitting: Physical Therapy

## 2017-03-15 DIAGNOSIS — M6281 Muscle weakness (generalized): Secondary | ICD-10-CM | POA: Diagnosis not present

## 2017-03-15 DIAGNOSIS — R262 Difficulty in walking, not elsewhere classified: Secondary | ICD-10-CM

## 2017-03-16 ENCOUNTER — Encounter: Payer: Self-pay | Admitting: Physical Therapy

## 2017-03-16 NOTE — Therapy (Signed)
[facility] [address] [place], [place], [address] Phone: [phone]   Fax:  [phone]  Physical Therapy Treatment  Patient Details  Name: Christine Chavez MRN: [id number] Date of Birth: Jul 26, 1980 Referring Provider: Dr. Luevenia Maxin   Encounter Date: 03/15/2017  PT End of Session - 03/16/17 1412    Visit Number  19    Number of Visits  27    Date for PT Re-Evaluation  03/29/17    PT Start Time  1028    PT Stop Time  1127    PT Time Calculation (min)  59 min    Equipment Utilized During Treatment  Gait belt    Activity Tolerance  Patient tolerated treatment well;Patient limited by fatigue;Patient limited by pain    Behavior During Therapy  [facility] for tasks assessed/performed       Past Medical History:  Diagnosis Date  . Headache   . Migraines   . Renal disorder   . Vision abnormalities     Past Surgical History:  Procedure Laterality Date  . ANTERIOR CRUCIATE LIGAMENT REPAIR  1997  . CYSTOSCOPY WITH STENT PLACEMENT Right 04/17/2016   Procedure: CYSTOSCOPY WITH STENT PLACEMENT;  Surgeon: [name], MD;  Location: [facility] ORS;  Service: Urology;  Laterality: Right;  . EXPLORATORY LAPAROTOMY  1999  . KIDNEY STONE SURGERY  04/2016  . TONSILLECTOMY      There were no vitals filed for this visit.  Subjective Assessment - 03/16/17 1411    Limitations  Lifting;Standing;Walking;House hold activities    Patient Stated Goals  ambulate with rollator walker    Currently in Pain?  Yes    Pain Score  3     Pain Location  Back    Pain Orientation  Lower;Right                                   PT Long Term Goals - 03/01/17 1237      PT LONG TERM GOAL #1   Title  Pt will report ability to ambulate in the community without the use of a wheelchair to allow patient to participate in desired activities    Baseline  Pt able to walk household distances with Rollator with supervision; when  fatigued pt requires SBA with short distances ~50'    Time  4    Period  Weeks    Status  Partially Met    Target Date  03/29/17      PT LONG TERM GOAL #2   Title  Pt. will increase Berg balance test to >55/56 to improve gait with least restrictive device/ promote decrease fall risk.      Baseline  Berg 52/56 on 12/04/16    Time  4    Period  Weeks    Status  On-going    Target Date  03/29/17      PT LONG TERM GOAL #3   Title  Pt. will increase LEFS score to >45 out of 80 to improve functional mobility.      Baseline  LEFS: 36 out of 80 on 12/04/16.  LEFS: 36 out of 80.  No change.    Time  4    Period  Weeks    Status  On-going    Target Date  03/29/17      PT LONG TERM GOAL #4   Title  Pt will report ability to  work in her yard for greater than 30 minutes without extreme fatigue    Baseline  limited to 10-15 minutes before pt requires ending activity    Time  4    Period  Weeks    Status  On-going    Target Date  03/29/17            Plan - 03/16/17 1412    Clinical Presentation  Stable    Clinical Decision Making  Moderate    Rehab Potential  Fair    PT Frequency  2x / week    PT Duration  4 weeks    PT Next Visit Plan  Continue tall kneeling/ balance tasks, general strengthening     PT Home Exercise Plan  Continued resisted 4-way hip; Continued with TKE, and standing 4-way hip AROM with BUE assist, and mini-squat to bed/chair.    Consulted and Agree with Plan of Care  Patient       Patient will benefit from skilled therapeutic intervention in order to improve the following deficits and impairments:  Abnormal gait, Improper body mechanics, Pain, Postural dysfunction, Decreased mobility, Decreased coordination, Decreased endurance, Decreased activity tolerance, Decreased range of motion, Decreased strength, Decreased safety awareness, Decreased balance, Difficulty walking  Visit Diagnosis: Muscle weakness (generalized)  Difficulty in walking, not elsewhere  classified     Problem List Patient Active Problem List   Diagnosis Date Noted  . Nephrolithiasis 04/16/2016  . Numbness 07/28/2015  . Bladder retention 06/23/2015  . Abdominal pain 06/04/2015  . Dizziness 05/18/2015  . Neck pain 05/18/2015  . Complicated migraine 19/50/9326  . Other fatigue 04/28/2015  . Abnormal finding on MRI of brain 04/28/2015  . D (diarrhea) 03/29/2015  . H/O disease 03/29/2015  . Abnormal weight loss 03/29/2015  . Muscle weakness (generalized) 03/14/2015  . Headache, migraine 03/10/2015    [name] 03/16/2017, 2:14 PM  [facility] [address] [place], [place], [address] Phone: [phone]   Fax:  [phone]  Name: Christine Chavez MRN: [id number] Date of Birth: 09-16-1980

## 2017-03-22 ENCOUNTER — Ambulatory Visit: Payer: 59 | Admitting: Physical Therapy

## 2017-03-26 MED ORDER — MIRTAZAPINE 15 MG PO TABS
7.50 | ORAL_TABLET | ORAL | Status: DC
Start: ? — End: 2017-03-26

## 2017-03-26 MED ORDER — CHOLECALCIFEROL 25 MCG (1000 UT) PO TABS
1000.00 | ORAL_TABLET | ORAL | Status: DC
Start: 2017-03-27 — End: 2017-03-26

## 2017-03-26 MED ORDER — ACETAMINOPHEN 325 MG PO TABS
650.00 | ORAL_TABLET | ORAL | Status: DC
Start: ? — End: 2017-03-26

## 2017-03-26 MED ORDER — GENERIC EXTERNAL MEDICATION
1000.00 | Status: DC
Start: 2017-03-27 — End: 2017-03-26

## 2017-03-26 MED ORDER — LIDOCAINE HCL (PF) 1 % IJ SOLN
.50 | INTRAMUSCULAR | Status: DC
Start: ? — End: 2017-03-26

## 2017-03-26 MED ORDER — GABAPENTIN 300 MG PO CAPS
300.00 | ORAL_CAPSULE | ORAL | Status: DC
Start: 2017-03-26 — End: 2017-03-26

## 2017-03-26 MED ORDER — FOLIC ACID 1 MG PO TABS
1.00 | ORAL_TABLET | ORAL | Status: DC
Start: 2017-03-27 — End: 2017-03-26

## 2017-03-26 MED ORDER — ONDANSETRON HCL 4 MG/2ML IJ SOLN
4.00 | INTRAMUSCULAR | Status: DC
Start: ? — End: 2017-03-26

## 2017-03-26 MED ORDER — VENLAFAXINE HCL ER 37.5 MG PO CP24
37.50 | ORAL_CAPSULE | ORAL | Status: DC
Start: 2017-03-27 — End: 2017-03-26

## 2017-03-26 MED ORDER — NITROFURANTOIN MONOHYD MACRO 100 MG PO CAPS
100.00 | ORAL_CAPSULE | ORAL | Status: DC
Start: 2017-03-26 — End: 2017-03-26

## 2017-03-26 MED ORDER — ENOXAPARIN SODIUM 40 MG/0.4ML ~~LOC~~ SOLN
40.00 | SUBCUTANEOUS | Status: DC
Start: 2017-03-27 — End: 2017-03-26

## 2017-03-26 MED ORDER — PROCHLORPERAZINE EDISYLATE 5 MG/ML IJ SOLN
5.00 | INTRAMUSCULAR | Status: DC
Start: ? — End: 2017-03-26

## 2017-03-26 MED ORDER — HYDROXYZINE PAMOATE 25 MG PO CAPS
25.00 | ORAL_CAPSULE | ORAL | Status: DC
Start: ? — End: 2017-03-26

## 2017-05-17 ENCOUNTER — Ambulatory Visit: Payer: 59

## 2017-07-30 DIAGNOSIS — N390 Urinary tract infection, site not specified: Secondary | ICD-10-CM

## 2017-08-15 HISTORY — PX: SUPRAPUBIC CATHETER PLACEMENT: SHX2473

## 2017-10-12 ENCOUNTER — Emergency Department
Admission: EM | Admit: 2017-10-12 | Discharge: 2017-10-12 | Disposition: A | Discharge status: Home / Self Care | Payer: 59 | Attending: Emergency Medicine | Admitting: Emergency Medicine

## 2017-10-12 ENCOUNTER — Other Ambulatory Visit: Payer: Self-pay

## 2017-10-12 ENCOUNTER — Encounter: Payer: Self-pay | Admitting: Emergency Medicine

## 2017-10-12 DIAGNOSIS — Y999 Unspecified external cause status: Secondary | ICD-10-CM | POA: Insufficient documentation

## 2017-10-12 DIAGNOSIS — S161XXA Strain of muscle, fascia and tendon at neck level, initial encounter: Secondary | ICD-10-CM

## 2017-10-12 DIAGNOSIS — Y92410 Unspecified street and highway as the place of occurrence of the external cause: Secondary | ICD-10-CM | POA: Diagnosis not present

## 2017-10-12 DIAGNOSIS — S199XXA Unspecified injury of neck, initial encounter: Secondary | ICD-10-CM | POA: Diagnosis present

## 2017-10-12 DIAGNOSIS — Y9389 Activity, other specified: Secondary | ICD-10-CM | POA: Diagnosis not present

## 2017-10-12 DIAGNOSIS — Z79899 Other long term (current) drug therapy: Secondary | ICD-10-CM | POA: Diagnosis not present

## 2017-10-12 HISTORY — DX: Neuromuscular dysfunction of bladder, unspecified: N31.9

## 2017-10-12 MED ORDER — ONDANSETRON HCL 4 MG/2ML IJ SOLN: 4.0000 mg | Freq: Once | INTRAMUSCULAR | Status: DC

## 2017-10-12 MED ORDER — ONDANSETRON 4 MG PO TBDP
4.0000 mg | ORAL_TABLET | Freq: Once | ORAL | Status: AC
  Administered 2017-10-12: 4 mg via ORAL

## 2017-10-12 MED ORDER — ACETAMINOPHEN 325 MG PO TABS
650.0000 mg | ORAL_TABLET | Freq: Once | ORAL | Status: AC
  Administered 2017-10-12: 650 mg via ORAL
  Filled 2017-10-12: qty 2

## 2017-10-12 MED ORDER — ONDANSETRON 4 MG PO TBDP
ORAL_TABLET | ORAL | Status: AC
  Filled 2017-10-12: qty 1
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
## 2017-10-12 NOTE — ED Notes (Signed)
ED Provider at bedside. 
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
## 2017-10-12 NOTE — Discharge Instructions (Addendum)
Return to the emergency room for any new or worsening symptoms including abdominal pain, headache, change in your chronic numbness or weakness, or any other concerns.

## 2017-12-09 IMAGING — CT CT ABD-PELV W/ CM
2 of 4 series · 16 of 46 positions shown, 18 images · IV contrast (iopamidol)
Comparison: 03/26/2007

CLINICAL DATA: RIGHT lower quadrant abdominal pain for 3 days with
nausea and chills, acute worsening RIGHT flank and lower quadrant
pain with tenderness at McBurney's point, initial encounter

EXAM:
CT ABDOMEN AND PELVIS WITH CONTRAST
TECHNIQUE: Multidetector CT imaging of the abdomen and pelvis was performed
using the standard protocol following bolus administration of
intravenous contrast. Sagittal and coronal MPR images reconstructed
from axial data set.
CONTRAST:  100mL [id number] IOPAMIDOL ([id number]) INJECTION 61% IV.
Dilute oral contrast.

[Series 2: routine abd pel with · axial · 0.68mm/px · z∈[-1076,-662]mm · 13 of 91 slices shown, 15 images]
[im 4/91  soft-tissue]
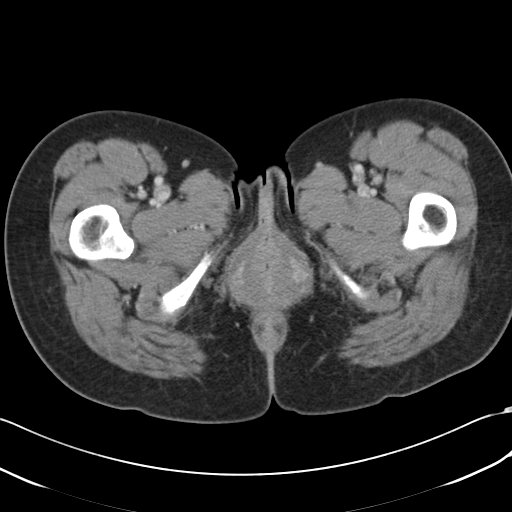
[im 4/91  bone]
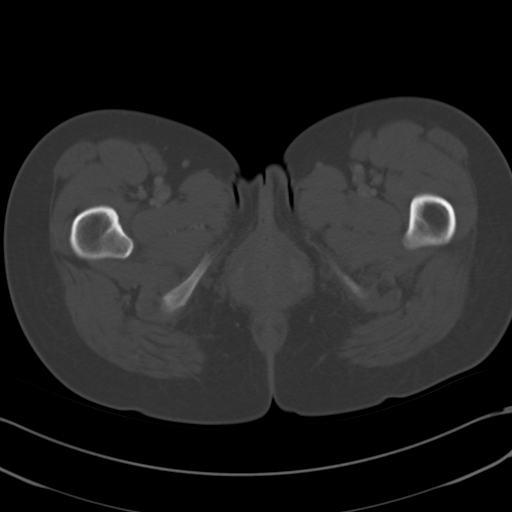
[im 12/91  soft-tissue]
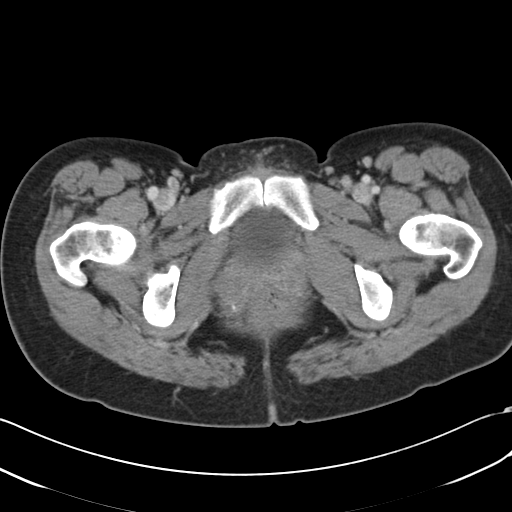
[im 19/91  soft-tissue]
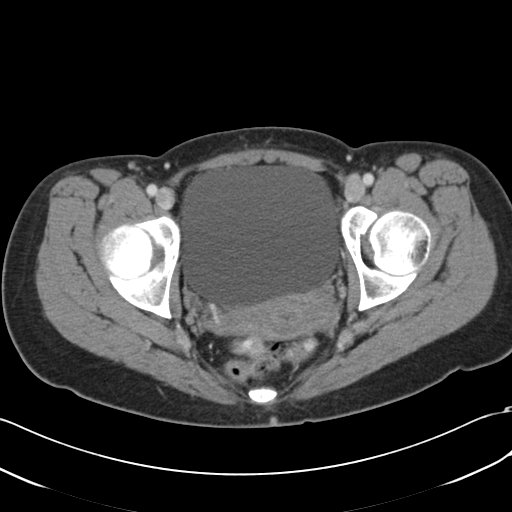
[im 27/91  soft-tissue]
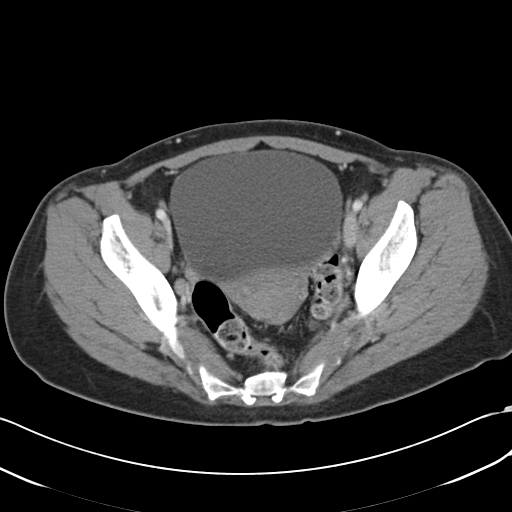
[im 31/91  soft-tissue]
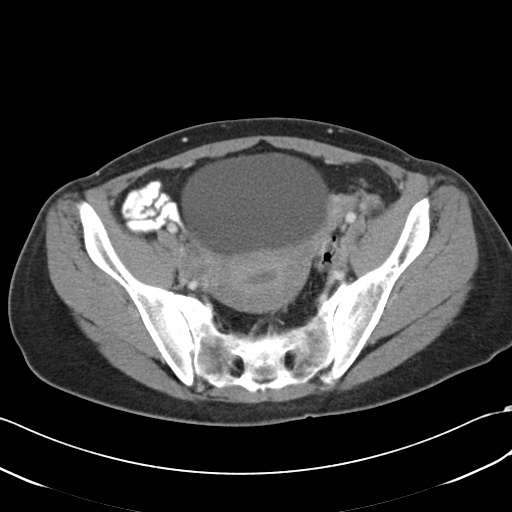
[im 38/91  soft-tissue]
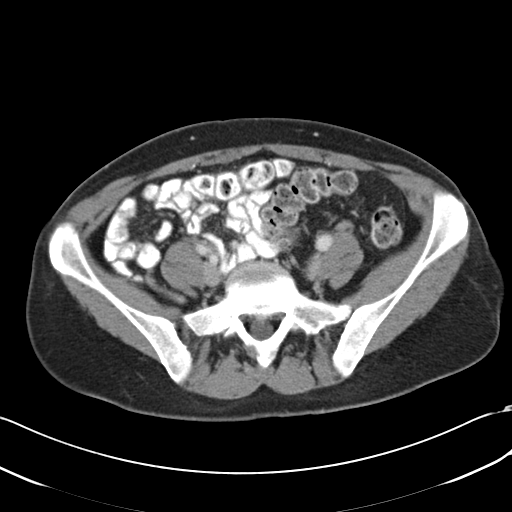
[im 46/91  soft-tissue]
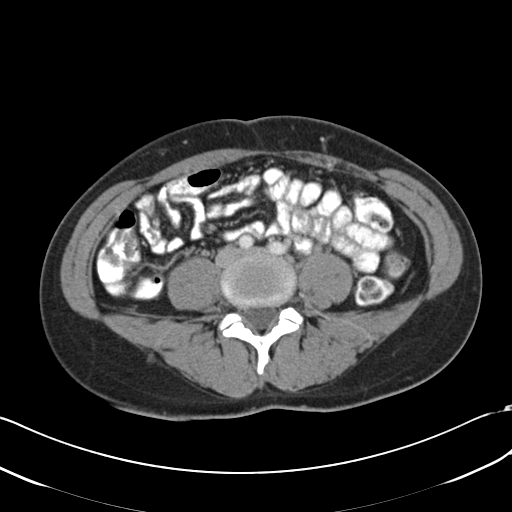
[im 53/91  soft-tissue]
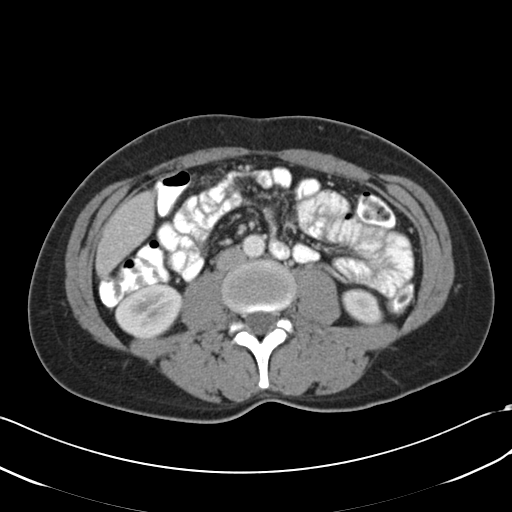
[im 61/91  soft-tissue]
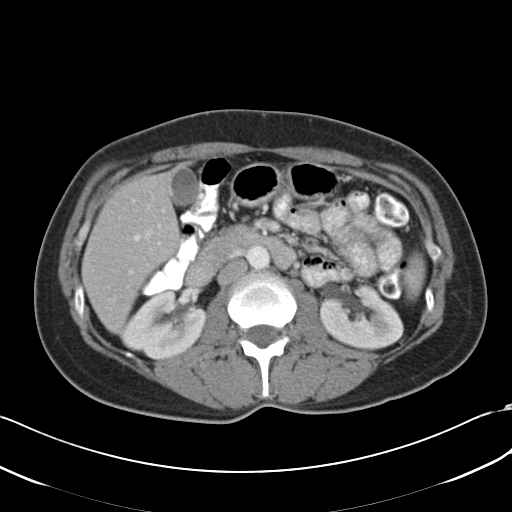
[im 61/91  bone]
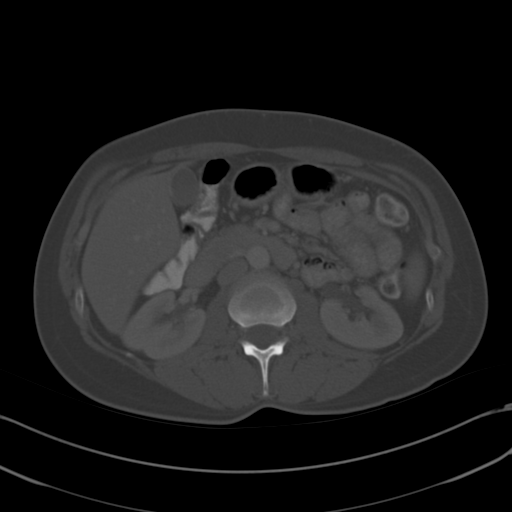
[im 64/91  soft-tissue]
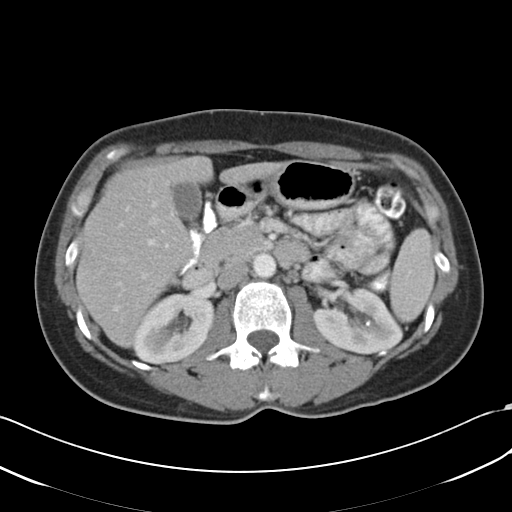
[im 72/91  soft-tissue]
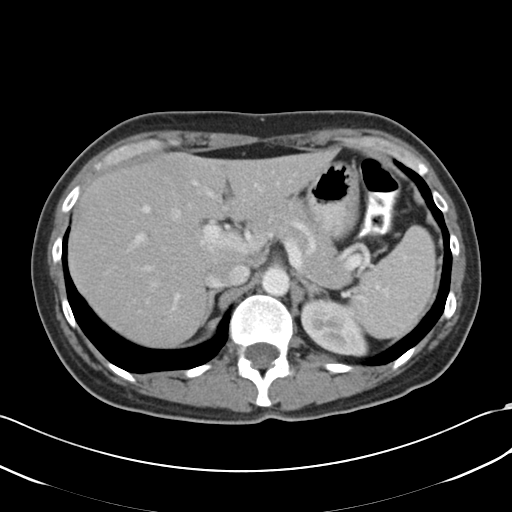
[im 79/91  soft-tissue]
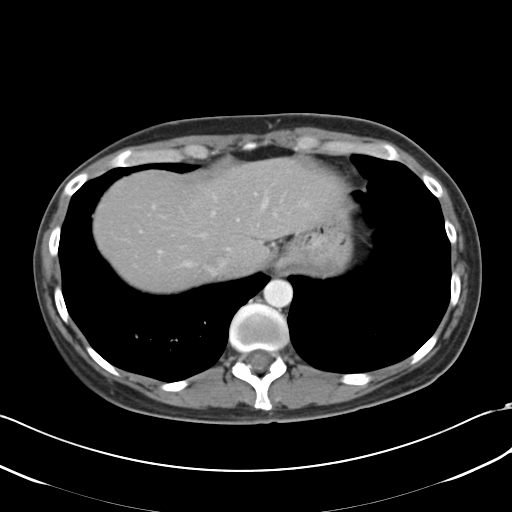
[im 87/91  soft-tissue]
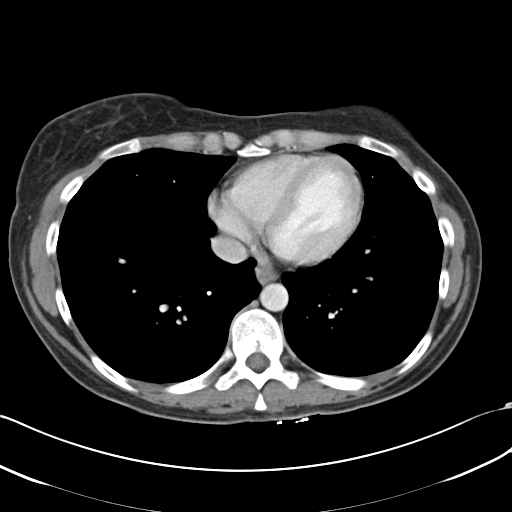

[Series 5: cor routine abd pel with · coronal · 0.60mm/px · 3 of 102 slices shown]
[im 34/102  soft-tissue]
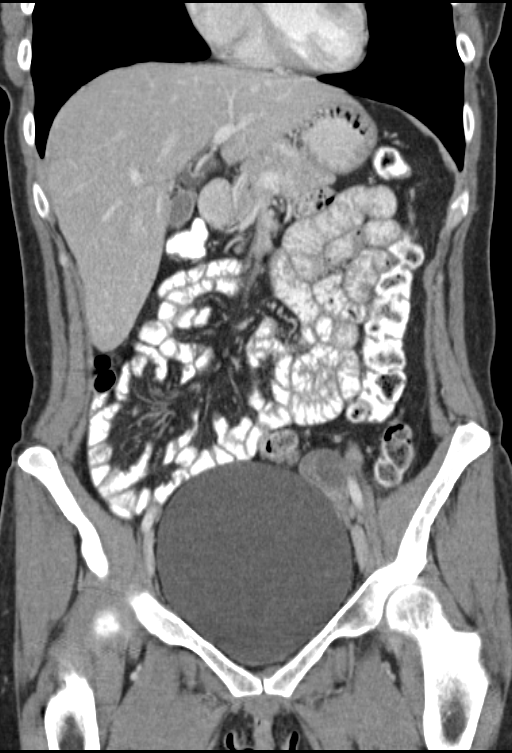
[im 45/102  soft-tissue]
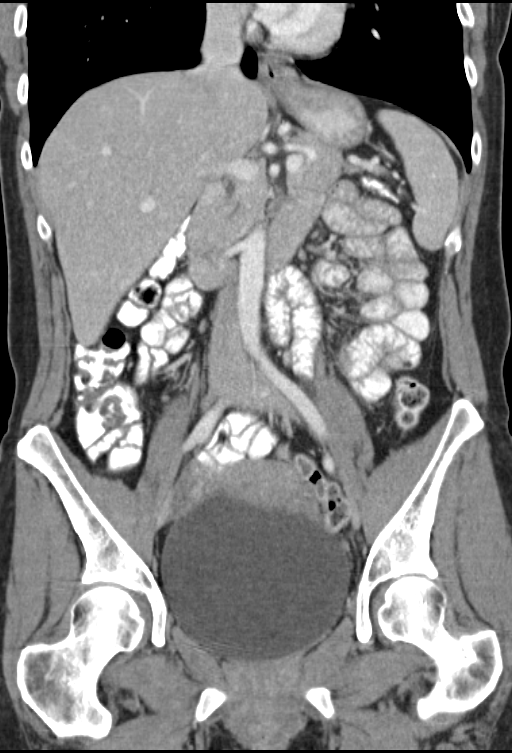
[im 57/102  soft-tissue]
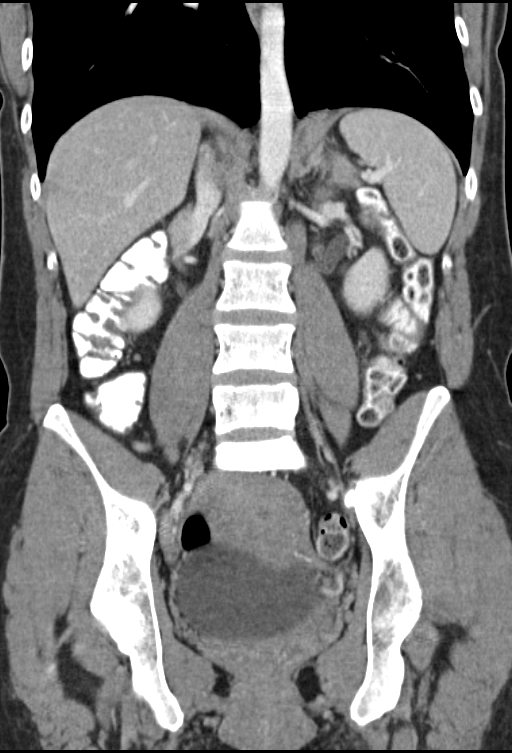

[16 of 46 positions shown; findings below may reference images not displayed]

FINDINGS: Lung bases clear.

Liver, gallbladder, spleen, pancreas, and adrenal glands normal
appearance.

Symmetric nephrograms with small BILATERAL nonobstructing renal
calculi.

No ureteral calcification or dilatation.

Distended urinary bladder without focal abnormality.

Normal appearing uterus and RIGHT adnexa.

LEFT ovarian cyst 2.0 x 1.7 cm image 57.

Normal appendix.

Stomach and bowel loops normal appearance.

No mass, adenopathy, free air, free fluid or inflammatory process.

Osseous structures unremarkable.
IMPRESSION: BILATERAL nonobstructing renal calculi.

Normal appendix.

Distended urinary bladder which is otherwise normal in appearance.

Small LEFT ovarian cyst.

No acute abnormalities.

## 2018-01-10 HISTORY — PX: REVISION UROSTOMY CUTANEOUS: SUR1282

## 2018-01-12 IMAGING — US US RENAL
1 series · 14 of 25 positions shown · non-contrast
Comparison: No prior ultrasound.

CLINICAL DATA: 34-year-old with current history of urinary tract
calculi, presenting with an approximate 3 week history of right
flank pain.

EXAM:
RENAL / URINARY TRACT ULTRASOUND COMPLETE

[Series 1: us renal · 0.23mm/px · 14 of 46 slices shown]
[im 1/46]
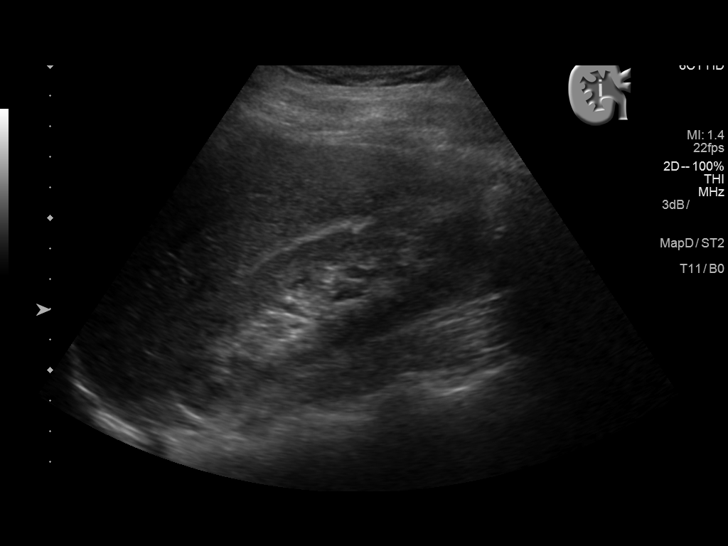
[im 4/46]
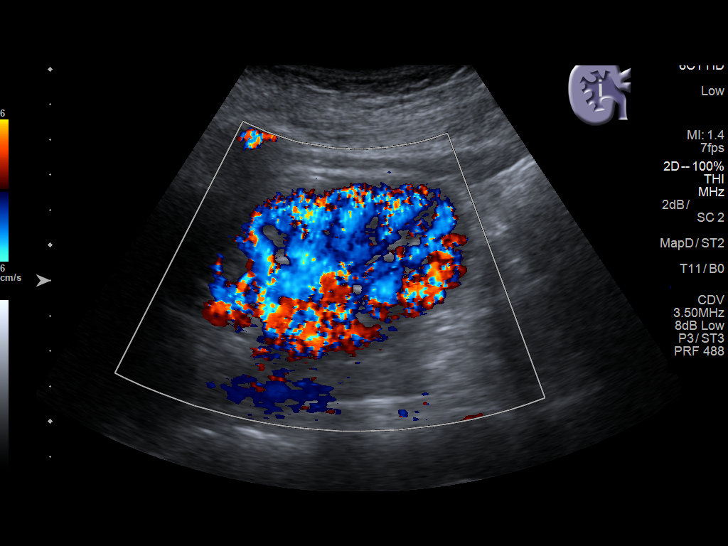
[im 8/46]
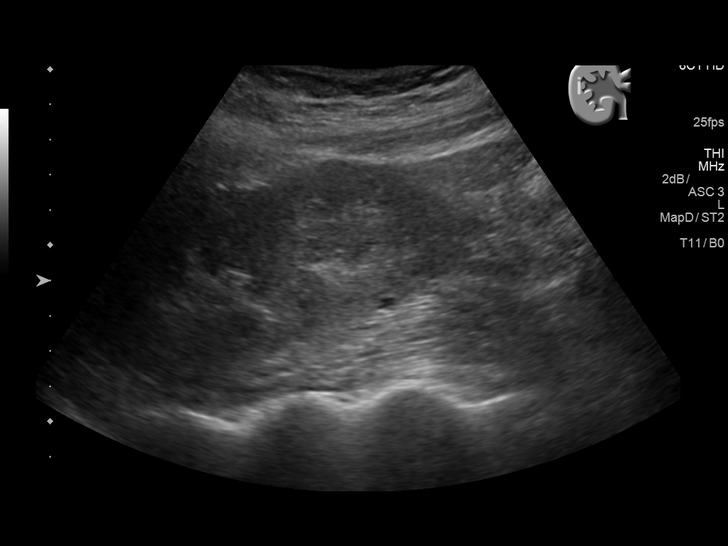
[im 12/46]
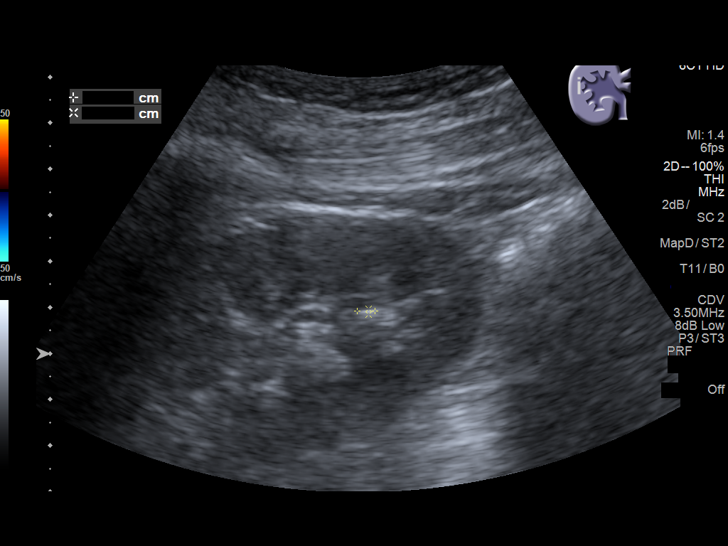
[im 16/46]
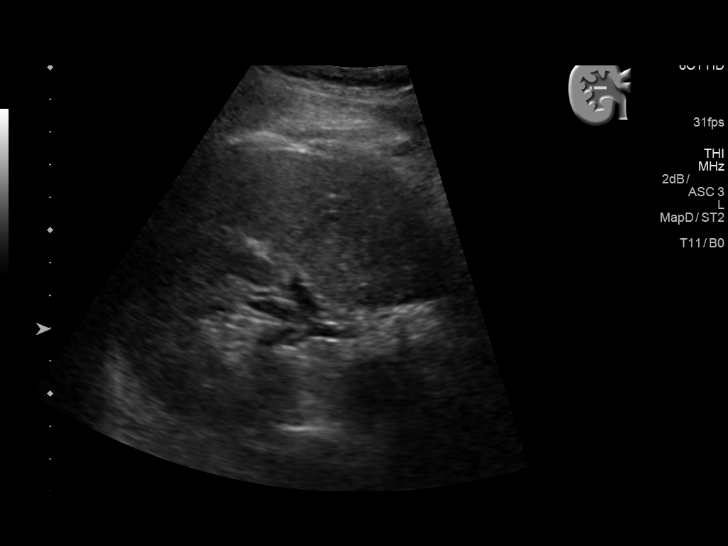
[im 17/46]
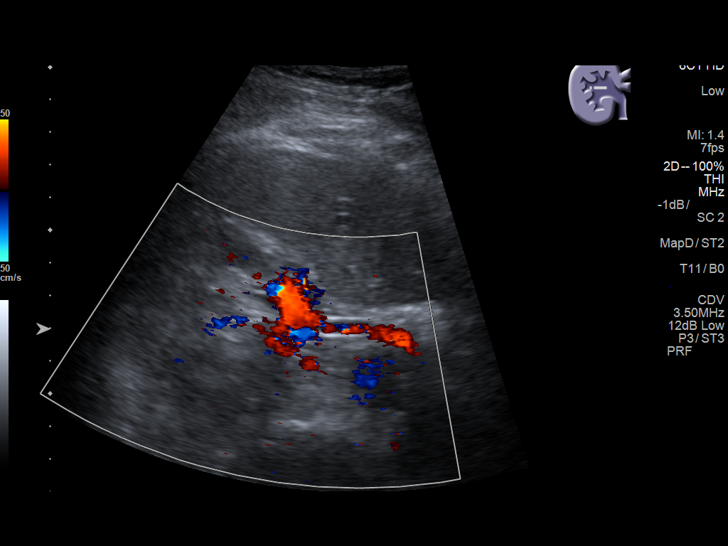
[im 21/46]
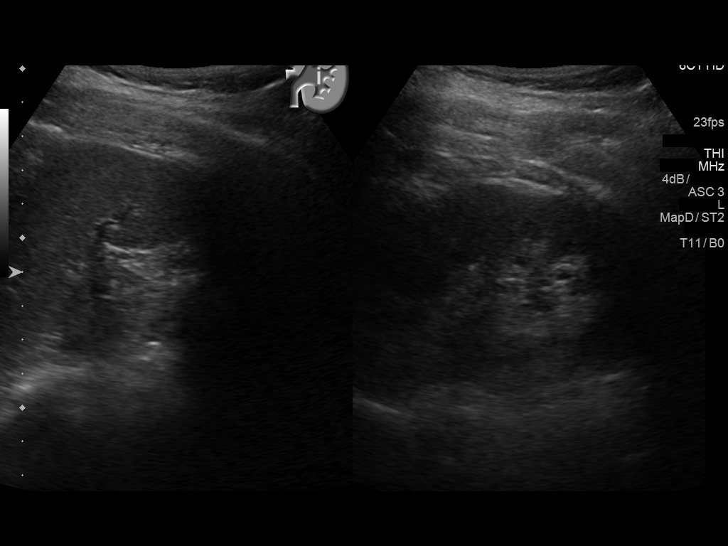
[im 25/46]
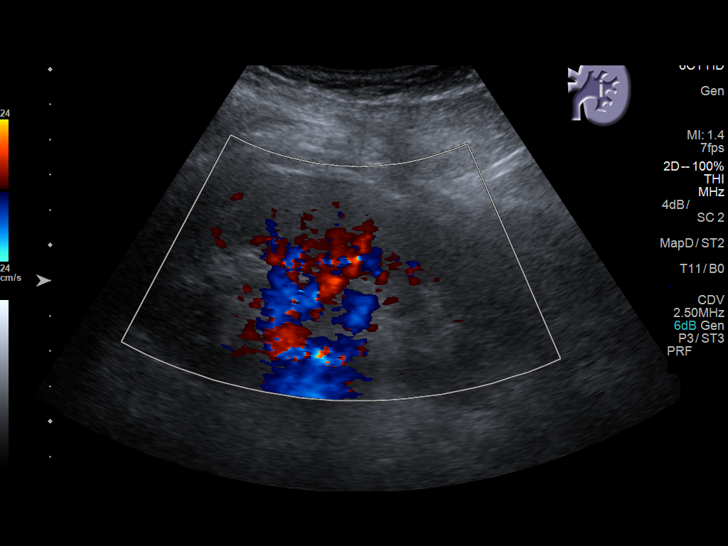
[im 29/46]
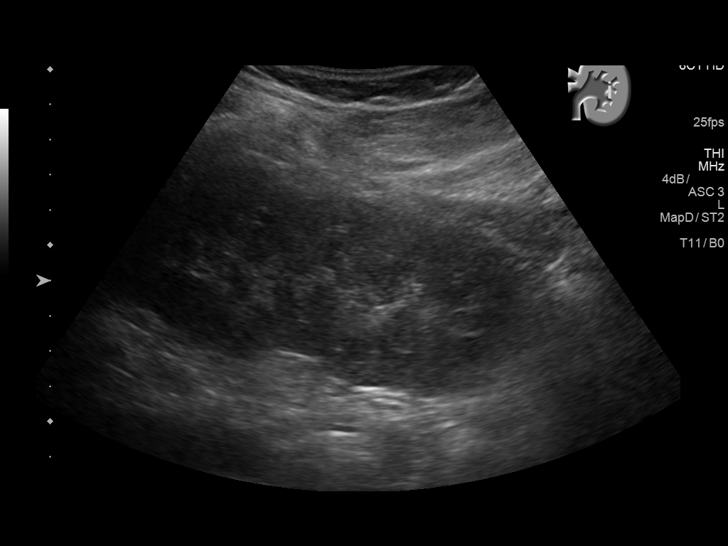
[im 31/46]
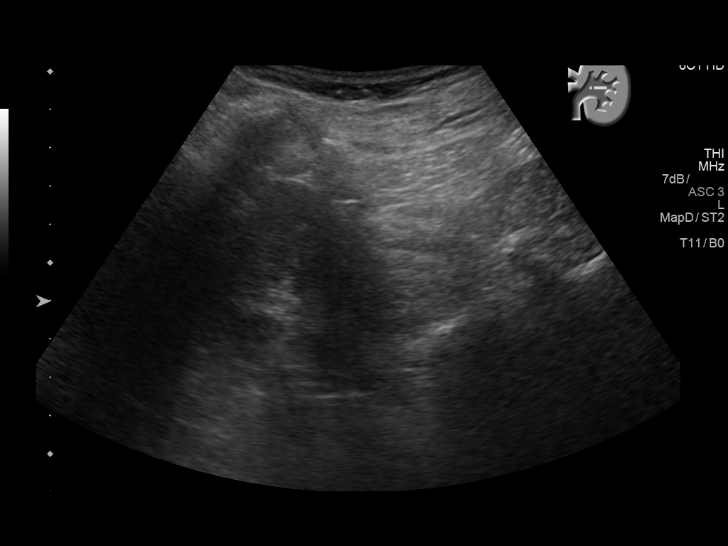
[im 34/46]
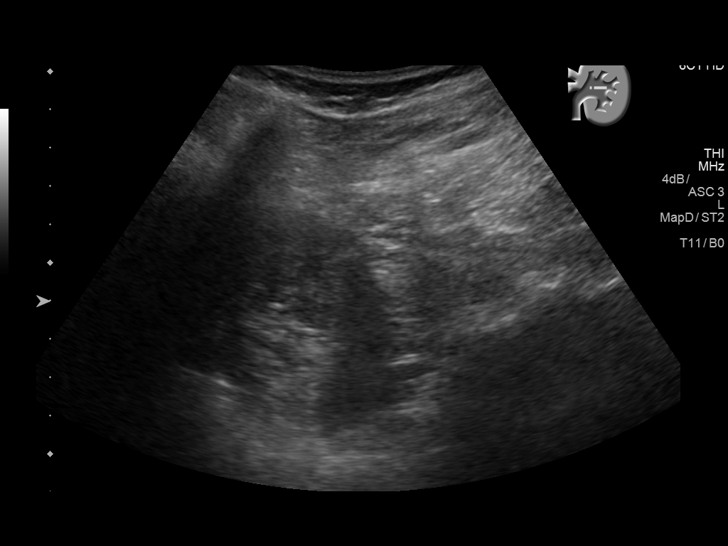
[im 38/46]
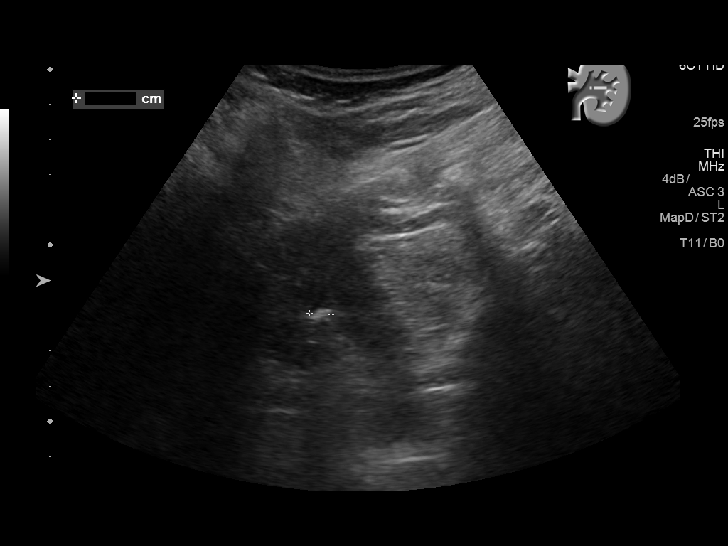
[im 42/46]
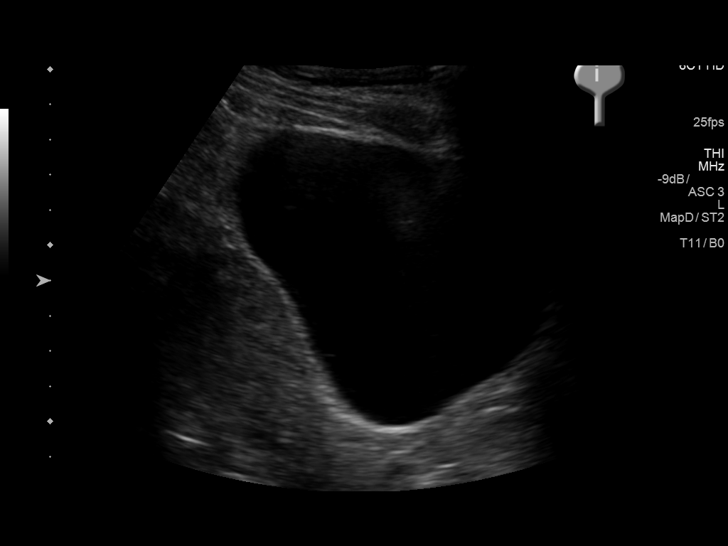
[im 46/46]
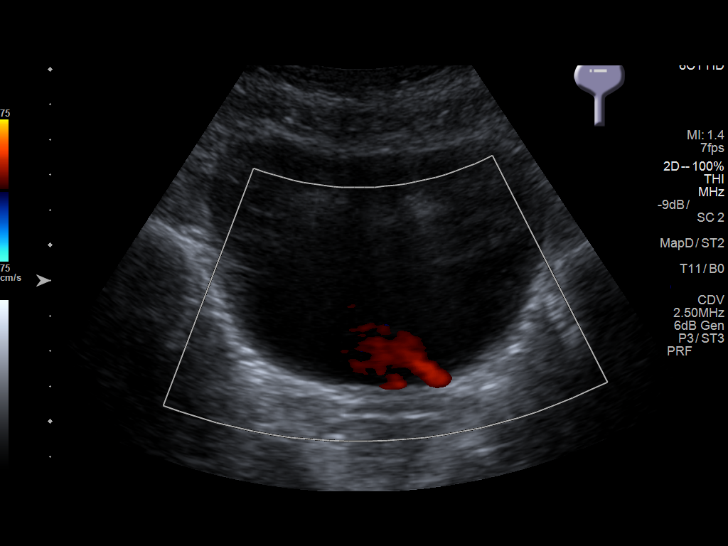

[14 of 25 positions shown; findings below may reference images not displayed]

Visualized kidneys on lumbar spine
MRI 06/14/2015. CT abdomen and pelvis 03/26/2007.
FINDINGS: Right Kidney:

Length: Approximately 12.1 cm.. No hydronephrosis. Well-preserved
cortex. Normal parenchymal echotexture. No focal parenchymal
abnormality. Multiple echogenic calculi involving the lower pole
calices, the largest approximating 4 mm.

Left Kidney:

Length: Approximately 12.4 cm. No hydronephrosis. Well-preserved
cortex. Normal parenchymal echotexture. No focal parenchymal
abnormality. Solitary echogenic calculus approximating 5 mm arising
from the midpole.

Bladder:

Normal in appearance. Bilateral ureteral jets identified a color
Doppler evaluation.
IMPRESSION: 1. Nonobstructing bilateral renal calculi.
2. No evidence of urinary tract obstruction on either side.

## 2018-01-18 ENCOUNTER — Telehealth: Payer: Self-pay | Admitting: Psychiatry

## 2018-01-18 NOTE — Telephone Encounter (Signed)
Will try to locate chart.    Thanks

## 2018-01-18 NOTE — Telephone Encounter (Signed)
Pt's husband called saying that his wife has been taking sertraline 150 mg. She is more depressed. She had major surgery on Thursday. She will be going to a rehab center so please call in either a different medication or an increase to [facility] in [place].

## 2018-01-22 NOTE — Telephone Encounter (Signed)
DR. Jennelle Human cannot refill medications or change dose until patient is seen.  He suggested that she contact her rebab dr for prescription refills.  Left detailed message explaining this on 01/22/18.

## 2018-02-07 DIAGNOSIS — F331 Major depressive disorder, recurrent, moderate: Secondary | ICD-10-CM | POA: Insufficient documentation

## 2018-02-20 ENCOUNTER — Encounter: Payer: Self-pay | Admitting: Psychiatry

## 2018-02-20 ENCOUNTER — Ambulatory Visit (INDEPENDENT_AMBULATORY_CARE_PROVIDER_SITE_OTHER): Payer: 59 | Admitting: Psychiatry

## 2018-02-20 VITALS — BP 131/86 | HR 104

## 2018-02-20 DIAGNOSIS — F419 Anxiety disorder, unspecified: Secondary | ICD-10-CM

## 2018-02-20 DIAGNOSIS — F332 Major depressive disorder, recurrent severe without psychotic features: Secondary | ICD-10-CM | POA: Diagnosis not present

## 2018-02-20 MED ORDER — DULOXETINE HCL 30 MG PO CPEP: ORAL_CAPSULE | ORAL | 0 refills | Status: DC

## 2018-02-20 MED ORDER — DULOXETINE HCL 60 MG PO CPEP: 60.0000 mg | ORAL_CAPSULE | Freq: Every day | ORAL | 1 refills | Status: DC

## 2018-02-20 NOTE — Progress Notes (Signed)
Christine Chavez [id number] [birth year] 37 y.o.  Subjective:   Patient ID:  Christine Chavez is a 37 y.o. (DOB 1980/10/27) female.  Chief Complaint:  Chief Complaint  Patient presents with  . Anxiety  . Depression    HPI Christine Chavez presents to the office today for follow-up. She is accompanied by friend.  Pt was seen once previously by Dr. Jennelle Human in May 2017. She reports that she had a urostomy and appendectomy and was hospitalized and then went to SNF and Sertraline was increased during that time.   She reports that "I am very depressed, but a lot of it is due to circumstances and what is happening to my body." Reports that mood has been persistently. She reports "I do know that I have never been this depressed before." She reports anxiety with hospitalizations and medical issues. She reports feeling "really nervous" at times. Friend reports that pt reports a fear of being alone. Reports experiencing physical s/s of anxiety in the ER when they told her she needed surgery and learning about what is wrong. She reports frequent worry and anxious thoughts.   She reports that she feels tired frequently and reports that medications contribute to this. She reports that her motivation is ok but unable to do more due to physical limitations. She reports that she is unable to sleep without medication and at times has difficulty sleeping with medication. She estimates sleeping 5-6 hours interrupted on average. She reports that her appetite has been decreased with some recent weight loss. Reports decreased interest. Reports frequently feeling guilty. Reports that she will start to do something and will get easily distracted. Reports concentration is not as good as usual. Reports that she had difficulty focusing enough to read recently. She reports passive death wishes and thinking that it would be easier on everyone if she were not here. Denies SI.   Has 3 children.   Past medication  Trials: Remeron- Reports sleeping better at 15 mg but feels more lethargic upon awakening compared to 7.5 mg.  Sertraline- Has been on 200 mg for the last month and has not noticed a significant improvement.   Review of Systems:  Review of Systems  Constitutional: Positive for appetite change and fatigue.  Gastrointestinal: Positive for constipation and nausea.  Genitourinary: Positive for menstrual problem.       Pain associated with urostomy  Musculoskeletal: Negative for gait problem.  Neurological: Positive for tremors, weakness, numbness and headaches. Negative for dizziness.       Reports occasional leg tremors  Psychiatric/Behavioral:       Please refer to HPI    Medications: I have reviewed the patient's current medications.  Current Outpatient Medications  Medication Sig Dispense Refill  . belladonna-opium (B&O SUPPRETTES) 16.2-30 MG suppository Place 30 mg rectally every 8 (eight) hours as needed for pain.    . butalbital-acetaminophen-caffeine (FIORICET, ESGIC) 50-325-40 MG tablet Take by mouth.    . Cyanocobalamin 1000 MCG CAPS Take by mouth.    . folic acid (FOLVITE) 1 MG tablet Take 1 mg by mouth daily.    Marland Kitchen gabapentin (NEURONTIN) 300 MG capsule Take 300 mg by mouth 3 (three) times daily.    . mirabegron ER (MYRBETRIQ) 25 MG TB24 tablet Take 25 mg by mouth daily.    . mirtazapine (REMERON) 7.5 MG tablet Take 7.5 mg by mouth at bedtime. Take 1/2 tab    . nitrofurantoin (MACRODANTIN) 100 MG capsule Take 100 mg by mouth 4 (four) times  daily.    . ondansetron (ZOFRAN) 4 MG tablet Take 4 mg by mouth every 8 (eight) hours as needed for nausea or vomiting.    . OXYCODONE HCL PO Take 5 mg by mouth every 6 (six) hours as needed.    . promethazine (PHENERGAN) 12.5 MG tablet Take 1 tablet (12.5 mg total) by mouth every 4 (four) hours as needed for nausea or vomiting. 20 tablet 0  . rizatriptan (MAXALT) 5 MG tablet Take 5 mg by mouth as needed.     Christine Chavez Sodium  8.6-50 MG CAPS Take by mouth.    . sertraline (ZOLOFT) 100 MG tablet Take 200 mg by mouth daily. 1.5 tabs (150 mg)     . terconazole (TERAZOL 7) 0.4 % vaginal cream Place 1 applicator at bedtime vaginally. 45 g 0  . Trospium Chloride 60 MG CP24 Take by mouth.    . DULoxetine (CYMBALTA) 30 MG capsule Take 1 capsule in the morning for one week, then increase to 2 capsules in the morning. 30 capsule 0  . DULoxetine (CYMBALTA) 60 MG capsule Take 1 capsule (60 mg total) by mouth daily. 30 capsule 1   No current facility-administered medications for this visit.     Medication Side Effects: Other: Feels she may be more sluggish on Sertraline 200 mg qd. Affective dulling.   Allergies:  Allergies  Allergen Reactions  . Aspirin Shortness Of Breath, Swelling and Anaphylaxis  . Ibuprofen Shortness Of Breath, Swelling and Anaphylaxis  . Morphine And Related Hives    Past Medical History:  Diagnosis Date  . Headache   . Migraines   . Neurogenic bladder   . Renal disorder   . Vision abnormalities     Family History  Problem Relation Age of Onset  . Hypertension Mother   . Atrial fibrillation Father   . Healthy Brother   . Depression Brother   . Arthritis/Rheumatoid Paternal Grandmother   . Healthy Brother     Social History   Socioeconomic History  . Marital status: Married    Spouse name: Not on file  . Number of children: Not on file  . Years of education: Not on file  . Highest education level: Not on file  Occupational History  . Not on file  Social Needs  . Financial resource strain: Not on file  . Food insecurity:    Worry: Not on file    Inability: Not on file  . Transportation needs:    Medical: Not on file    Non-medical: Not on file  Tobacco Use  . Smoking status: Never Smoker  . Smokeless tobacco: Never Used  Substance and Sexual Activity  . Alcohol use: No    Alcohol/week: 0.0 standard drinks  . Drug use: No  . Sexual activity: Yes    Comment: vasectomy   Lifestyle  . Physical activity:    Days per week: Not on file    Minutes per session: Not on file  . Stress: Not on file  Relationships  . Social connections:    Talks on phone: Not on file    Gets together: Not on file    Attends religious service: Not on file    Active member of club or organization: Not on file    Attends meetings of clubs or organizations: Not on file    Relationship status: Not on file  . Intimate partner violence:    Fear of current or ex partner: Not on file    Emotionally  abused: Not on file    Physically abused: Not on file    Forced sexual activity: Not on file  Other Topics Concern  . Not on file  Social History Narrative  . Not on file    Past Medical History, Surgical history, Social history, and Family history were reviewed and updated as appropriate.   Please see review of systems for further details on the patient's review from today.   Objective:   Physical Exam:  BP 131/86   Pulse (!) 104   Physical Exam  Constitutional: She is oriented to person, place, and time. She appears well-developed. No distress.  Musculoskeletal:  Patient in wheelchair.  Neurological: She is alert and oriented to person, place, and time.  No tremors noted  Psychiatric: Her speech is normal. Judgment and thought content normal. Her mood appears anxious. Her affect is not angry, not blunt, not labile and not inappropriate. Cognition and memory are normal. She exhibits a depressed mood. She expresses no homicidal and no suicidal ideation. She expresses no suicidal plans and no homicidal plans.  Insight intact. No auditory or visual hallucinations. No delusions.  Affect is constricted. Patient initially was somewhat guarded and withdrawn and became increasingly engaged as exam progressed.    Lab Review:     Component Value Date/Time   NA 136 03/03/2017 2132   K 3.3 (L) 03/03/2017 2132   CL 104 03/03/2017 2132   CO2 24 03/03/2017 2132   GLUCOSE 88  03/03/2017 2132   BUN 11 03/03/2017 2132   CREATININE 0.73 03/03/2017 2132   CALCIUM 9.0 03/03/2017 2132   PROT 7.2 03/03/2017 2132   ALBUMIN 4.3 03/03/2017 2132   AST 17 03/03/2017 2132   ALT 15 03/03/2017 2132   ALKPHOS 51 03/03/2017 2132   BILITOT 1.7 (H) 03/03/2017 2132   GFRNONAA >60 03/03/2017 2132   GFRAA >60 03/03/2017 2132       Component Value Date/Time   WBC 8.1 03/03/2017 2132   RBC 4.17 03/03/2017 2132   HGB 13.5 03/03/2017 2132   HCT 38.7 03/03/2017 2132   HCT 31.7 (L) 04/28/2012 0618   PLT 304 03/03/2017 2132   MCV 92.8 03/03/2017 2132   MCH 32.3 03/03/2017 2132   MCHC 34.8 03/03/2017 2132   RDW 12.8 03/03/2017 2132   LYMPHSABS 3.3 03/03/2017 2132   MONOABS 0.6 03/03/2017 2132   EOSABS 0.1 03/03/2017 2132   BASOSABS 0.1 03/03/2017 2132    No results found for: POCLITH, LITHIUM   No results found for: PHENYTOIN, PHENOBARB, VALPROATE, CBMZ   .res Assessment: Plan:   Patient seen for 50 minutes and greater than 50% of visit spent counseling patient and her friend regarding depressive signs and symptoms and anxiety signs and symptoms and differentiating clinical signs and symptoms of depression from sadness in response to situational stressors and losses.  Discussed that patient is currently experiencing some severe depressive signs and symptoms that are interfering with overall function, and therefore recommend change in treatment since she is currently at max dose of sertraline and there has not been any significant improvement in mood or anxiety with sertraline even prior to most recent stressors.  Reviewed different types of antidepressants and their proposed mechanism of action, to include how an SSRI differs from an SNRI.  Recommended treatment with an SNRI since she has not responded to sertraline and an SNRI may potentially be helpful for pain as well.  Also discussed that SNRIs typically tend target some of the depressive signs and  symptoms that are currently  most bothersome to her, to include low energy and motivation.  Discussed potential benefits, risks, and side effects of Cymbalta.  Discussed cross titrating medications to avoid discontinuation signs and symptoms and patient provided with verbal and written instructions regarding cross titration.  Patient advised to decrease sertraline to 100 mg daily for 1 week and then discontinue.  Will start Cymbalta 30 mg in the morning for 1 week, then increase to 60 mg in the morning for anxiety and depression.  Patient to follow-up in 6 weeks or sooner if clinically indicated.  Patient advised to contact office if she experiences any severe tolerability issues.  Discussed that it would likely be beneficial to resume therapy. Anxiety disorder, unspecified type - Plan: DULoxetine (CYMBALTA) 30 MG capsule, DULoxetine (CYMBALTA) 60 MG capsule  Severe episode of recurrent major depressive disorder, without psychotic features (HCC) - Plan: DULoxetine (CYMBALTA) 30 MG capsule, DULoxetine (CYMBALTA) 60 MG capsule  Please see After Visit Summary for patient specific instructions.  Future Appointments  Date Time Provider Department Center  04/03/2018 10:00 AM Corie Chiquito, PMHNP [facility] None    No orders of the defined types were placed in this encounter.     -------------------------------

## 2018-02-20 NOTE — Patient Instructions (Addendum)
Decrease Sertraline to 100 mg daily for one week, then discontinue.  Start Cymbalta (Duloxetine) 30 mg in the morning for one week (while also taking Sertraline 100 mg), then increase to two 30 mg capsules daily.  I have also sent a script for Cymbalta 60 mg daily for you to refill when you are running out of 30 mg capsules.

## 2018-04-03 ENCOUNTER — Ambulatory Visit (INDEPENDENT_AMBULATORY_CARE_PROVIDER_SITE_OTHER): Payer: 59 | Admitting: Psychiatry

## 2018-04-03 ENCOUNTER — Encounter: Payer: Self-pay | Admitting: Psychiatry

## 2018-04-03 VITALS — BP 129/87 | HR 104

## 2018-04-03 DIAGNOSIS — F419 Anxiety disorder, unspecified: Secondary | ICD-10-CM

## 2018-04-03 DIAGNOSIS — F5101 Primary insomnia: Secondary | ICD-10-CM | POA: Diagnosis not present

## 2018-04-03 DIAGNOSIS — F332 Major depressive disorder, recurrent severe without psychotic features: Secondary | ICD-10-CM | POA: Diagnosis not present

## 2018-04-03 MED ORDER — DULOXETINE HCL 60 MG PO CPEP: 60.0000 mg | ORAL_CAPSULE | Freq: Every day | ORAL | 0 refills | Status: DC

## 2018-04-03 MED ORDER — BREXPIPRAZOLE 1 MG PO TABS: 1.0000 mg | ORAL_TABLET | Freq: Every day | ORAL | 0 refills | Status: AC

## 2018-04-03 MED ORDER — BREXPIPRAZOLE 0.5 MG PO TABS: 0.5000 mg | ORAL_TABLET | Freq: Every morning | ORAL | 0 refills | Status: AC

## 2018-04-03 NOTE — Progress Notes (Signed)
Danija Gosa [id number] 05-17-1980 37 y.o.  Subjective:   Patient ID:  Christine Chavez is a 37 y.o. (DOB [birth year]) female.  Chief Complaint:  Chief Complaint  Patient presents with  . Depression  . Anxiety  . Sleeping Problem    HPI [facility] [place] presents to the office today for follow-up of depression and anxiety. She reports that she has had a difficult week due to health issues and current UTI. Reports that she is "overwhelmed with everything." Reports feeling as if she is a bother to others and is uncomfortable asking for help. Reports that her mood has been lower the last few weeks and has been "more emotional." She reports that she saw some improvement in depression initially and then seems to be more depressed in recent weeks. Reports some frustration with being dependent on others. Denies severe irritability. Reports some situational  Anxiety, such as having to use the bathroom in public and having restrictions. She reports some anxiety about holidays approaching and not being able to do things she would like to do. She reports that she has been crying frequently. "I feel like I am a lot of trouble for everyone." She reports that her sleep has been disturbed since the weekend. Reports that she will take 1/2 tab Remeron when husband is away and will take 1 tab po QHS when husband is home. Appetite fluctuates and has had to make herself. She reports that energy has been low with current infection. Denies any improvement in energy with Cymbalta. Reports that her motivation is good but is physically unable to do as much as she would like. She reports that her concentration is poor at times. Reports excessive feelings of guilt.   Denies SI.   Has talked with minister/counselor.   Reports that she had some nausea and did not "feel right" around the time of transitioning from Sertraline to Cymbalta and also had infection at that time.   Past medication Trials: Remeron-  Reports sleeping better at 15 mg but feels more lethargic upon awakening compared to 7.5 mg.  Sertraline- Has been on 200 mg for the last month and has not noticed a significant improvement.  Cymbalta   Review of Systems:  Review of Systems  Constitutional: Positive for chills.  Genitourinary: Positive for flank pain.       Reports current UTI.   Musculoskeletal: Positive for gait problem.    Medications: I have reviewed the patient's current medications.  Current Outpatient Medications  Medication Sig Dispense Refill  . amoxicillin-clavulanate (AUGMENTIN) 875-125 MG tablet Take by mouth.    . butalbital-acetaminophen-caffeine (FIORICET, ESGIC) 50-325-40 MG tablet Take by mouth.    . Cyanocobalamin 1000 MCG CAPS Take by mouth.    . folic acid (FOLVITE) 1 MG tablet Take 1 mg by mouth daily.    Marland Kitchen gabapentin (NEURONTIN) 300 MG capsule Take 300 mg by mouth 3 (three) times daily.    . mirabegron ER (MYRBETRIQ) 50 MG TB24 tablet Take by mouth daily.     . mirtazapine (REMERON) 15 MG tablet Take 7.5 mg by mouth at bedtime. Take 1/2-1 tab po QHS    . ondansetron (ZOFRAN) 4 MG tablet Take 4 mg by mouth every 8 (eight) hours as needed for nausea or vomiting.    . OXYCODONE HCL PO Take 5 mg by mouth every 6 (six) hours as needed.    . promethazine (PHENERGAN) 12.5 MG tablet Take 1 tablet (12.5 mg total) by mouth every 4 (four) hours  as needed for nausea or vomiting. 20 tablet 0  . rizatriptan (MAXALT) 5 MG tablet Take 5 mg by mouth as needed.     . Trospium Chloride 60 MG CP24 Take by mouth.    . belladonna-opium (B&O SUPPRETTES) 16.2-30 MG suppository Place 30 mg rectally every 8 (eight) hours as needed for pain.    . Brexpiprazole (REXULTI) 0.5 MG TABS Take 1 tablet (0.5 mg total) by mouth every morning for 7 days. 7 tablet 0  . Brexpiprazole (REXULTI) 1 MG TABS Take 1 tablet (1 mg total) by mouth daily for 14 days. Samples provided 14 tablet 0  . Brexpiprazole (REXULTI) 1 MG TABS Take 1  tablet (1 mg total) by mouth daily for 15 days. 15 tablet 0  . DULoxetine (CYMBALTA) 60 MG capsule Take 1 capsule (60 mg total) by mouth daily. 90 capsule 0  . nitrofurantoin (MACRODANTIN) 100 MG capsule Take 100 mg by mouth 4 (four) times daily.    Bernadette Hoit Sodium 8.6-50 MG CAPS Take by mouth.    . terconazole (TERAZOL 7) 0.4 % vaginal cream Place 1 applicator at bedtime vaginally. (Patient not taking: Reported on 04/03/2018) 45 g 0   No current facility-administered medications for this visit.     Medication Side Effects: None  Allergies:  Allergies  Allergen Reactions  . Aspirin Shortness Of Breath, Swelling and Anaphylaxis  . Ibuprofen Shortness Of Breath, Swelling and Anaphylaxis  . Morphine And Related Hives    Past Medical History:  Diagnosis Date  . Headache   . Migraines   . Neurogenic bladder   . Renal disorder   . Vision abnormalities     Family History  Problem Relation Age of Onset  . Hypertension Mother   . Atrial fibrillation Father   . Healthy Brother   . Depression Brother   . Arthritis/Rheumatoid Paternal Grandmother   . Healthy Brother     Social History   Socioeconomic History  . Marital status: Married    Spouse name: Not on file  . Number of children: Not on file  . Years of education: Not on file  . Highest education level: Not on file  Occupational History  . Not on file  Social Needs  . Financial resource strain: Not on file  . Food insecurity:    Worry: Not on file    Inability: Not on file  . Transportation needs:    Medical: Not on file    Non-medical: Not on file  Tobacco Use  . Smoking status: Never Smoker  . Smokeless tobacco: Never Used  Substance and Sexual Activity  . Alcohol use: No    Alcohol/week: 0.0 standard drinks  . Drug use: No  . Sexual activity: Yes    Comment: vasectomy  Lifestyle  . Physical activity:    Days per week: Not on file    Minutes per session: Not on file  . Stress: Not on file   Relationships  . Social connections:    Talks on phone: Not on file    Gets together: Not on file    Attends religious service: Not on file    Active member of club or organization: Not on file    Attends meetings of clubs or organizations: Not on file    Relationship status: Not on file  . Intimate partner violence:    Fear of current or ex partner: Not on file    Emotionally abused: Not on file    Physically abused: Not  on file    Forced sexual activity: Not on file  Other Topics Concern  . Not on file  Social History Narrative  . Not on file    Past Medical History, Surgical history, Social history, and Family history were reviewed and updated as appropriate.   Please see review of systems for further details on the patient's review from today.   Objective:   Physical Exam:  BP 129/87   Pulse (!) 104   Physical Exam Constitutional:      General: She is not in acute distress.    Appearance: She is well-developed.  Musculoskeletal:        General: No deformity.  Neurological:     Mental Status: She is alert and oriented to person, place, and time.     Coordination: Coordination normal.  Psychiatric:        Mood and Affect: Mood is anxious and depressed. Affect is blunt and tearful. Affect is not labile, angry or inappropriate.        Speech: Speech normal.        Behavior: Behavior normal.        Thought Content: Thought content normal. Thought content does not include homicidal or suicidal ideation. Thought content does not include homicidal or suicidal plan.        Judgment: Judgment normal.     Comments: Insight intact. No auditory or visual hallucinations. No delusions.      Lab Review:     Component Value Date/Time   NA 136 03/03/2017 2132   K 3.3 (L) 03/03/2017 2132   CL 104 03/03/2017 2132   CO2 24 03/03/2017 2132   GLUCOSE 88 03/03/2017 2132   BUN 11 03/03/2017 2132   CREATININE 0.73 03/03/2017 2132   CALCIUM 9.0 03/03/2017 2132   PROT 7.2  03/03/2017 2132   ALBUMIN 4.3 03/03/2017 2132   AST 17 03/03/2017 2132   ALT 15 03/03/2017 2132   ALKPHOS 51 03/03/2017 2132   BILITOT 1.7 (H) 03/03/2017 2132   GFRNONAA >60 03/03/2017 2132   GFRAA >60 03/03/2017 2132       Component Value Date/Time   WBC 8.1 03/03/2017 2132   RBC 4.17 03/03/2017 2132   HGB 13.5 03/03/2017 2132   HCT 38.7 03/03/2017 2132   HCT 31.7 (L) 04/28/2012 0618   PLT 304 03/03/2017 2132   MCV 92.8 03/03/2017 2132   MCH 32.3 03/03/2017 2132   MCHC 34.8 03/03/2017 2132   RDW 12.8 03/03/2017 2132   LYMPHSABS 3.3 03/03/2017 2132   MONOABS 0.6 03/03/2017 2132   EOSABS 0.1 03/03/2017 2132   BASOSABS 0.1 03/03/2017 2132    No results found for: POCLITH, LITHIUM   No results found for: PHENYTOIN, PHENOBARB, VALPROATE, CBMZ   .res Assessment: Plan:   Discussed possible treatment options to include increasing Cymbalta to 90 mg po qd or starting Rexulti for augmentation of depression. Discussed potential benefits, risks, and side effects of Rexulti. Discussed potential metabolic side effects associated with atypical antipsychotics, as well as potential risk for movement side effects. Advised pt to contact office if movement side effects occur. Will start Rexulti 0.5 mg po qd x 1 week, then increase to 1 mg po qd for depression. Will continue Cymbalta 60 mg po qd for depression and anxiety.   Severe episode of recurrent major depressive disorder, without psychotic features (HCC) - unstable - Plan: Brexpiprazole (REXULTI) 0.5 MG TABS, Brexpiprazole (REXULTI) 1 MG TABS, Brexpiprazole (REXULTI) 1 MG TABS, DULoxetine (CYMBALTA) 60 MG capsule  Anxiety disorder, unspecified type - unstable - Plan: DULoxetine (CYMBALTA) 60 MG capsule  Primary insomnia - Not controlled  Please see After Visit Summary for patient specific instructions.  Future Appointments  Date Time Provider Department Center  05/08/2018 10:30 AM Corie Chiquito, PMHNP [facility] None    No orders of  the defined types were placed in this encounter.     -------------------------------

## 2018-04-19 ENCOUNTER — Telehealth: Payer: Self-pay | Admitting: Psychiatry

## 2018-04-19 NOTE — Telephone Encounter (Signed)
Pt. Called and said that the medicine change you made with her is not working or a good fit for her. Please call her back [phone]

## 2018-04-22 NOTE — Telephone Encounter (Signed)
She's notified and verbalized understanding

## 2018-04-22 NOTE — Telephone Encounter (Signed)
C/o of a restlessness, tightness in chest since starting the rexulti. She started with 0.5 mg and is now taking 1 mg daily. Didn't know if the combination of her other meds was making her feel this way, but she's never experienced this and didn't know if she should stop? Said it's hard to explain how she's feeling on it.

## 2018-05-02 ENCOUNTER — Telehealth: Payer: Self-pay | Admitting: Psychiatry

## 2018-05-02 NOTE — Telephone Encounter (Signed)
Patient call

## 2018-05-02 NOTE — Telephone Encounter (Signed)
See 1/06 telephone msg Please advise

## 2018-05-02 NOTE — Telephone Encounter (Signed)
Patient called stated the meds are still not working after the adjustment . Please call.

## 2018-05-03 NOTE — Telephone Encounter (Signed)
Pt verbalized understanding and will discuss next week at [facility]

## 2018-05-08 ENCOUNTER — Encounter: Payer: Self-pay | Admitting: Psychiatry

## 2018-05-08 ENCOUNTER — Ambulatory Visit (INDEPENDENT_AMBULATORY_CARE_PROVIDER_SITE_OTHER): Payer: 59 | Admitting: Psychiatry

## 2018-05-08 VITALS — BP 152/104 | HR 79

## 2018-05-08 DIAGNOSIS — F419 Anxiety disorder, unspecified: Secondary | ICD-10-CM

## 2018-05-08 DIAGNOSIS — F5101 Primary insomnia: Secondary | ICD-10-CM | POA: Diagnosis not present

## 2018-05-08 DIAGNOSIS — F332 Major depressive disorder, recurrent severe without psychotic features: Secondary | ICD-10-CM

## 2018-05-08 MED ORDER — DULOXETINE HCL 60 MG PO CPEP: 60.0000 mg | ORAL_CAPSULE | Freq: Every day | ORAL | 0 refills | Status: DC

## 2018-05-08 MED ORDER — MIRTAZAPINE 30 MG PO TABS: 30.0000 mg | ORAL_TABLET | Freq: Every day | ORAL | 1 refills | Status: DC

## 2018-05-08 NOTE — Progress Notes (Signed)
Christine Chavez [id number] 1980/12/25 38 y.o.  Subjective:   Patient ID:  Christine Chavez is a 38 y.o. (DOB 02-Oct-1980) female.  Chief Complaint:  Chief Complaint  Patient presents with  . Depression  . Anxiety  . Insomnia    HPI [address] [place] presents to the office today for follow-up of depression and anxiety. She is accompanied by her friend and husband. Pt reports frequent crying and feeling more depressed. She experienced chest pain and SOB around the time she started Rexulti, she was advised by this provider to stop Rexulti, and reports that these s/s resolved when she stopped Rexulti. Reports that she has excessive feelings of guilt about crying and needing assistance from othes. Reports that recent medical issues have contributed to mood s/s. She reports low energy. She and supports report motivation has been lower. Friend reports that pt has desire to do some things but has difficulty initiating tasks. She reports that her sleep has been poor unless she takes Remeron. Difficulty falling and staying asleep. Reports Remeron will help with sleep initiation and then has intermittent awakenings. Appetite has been decreased. Concentration has been decreased. Reports that she is trying to distract herself from negative thoughts. Has made comments that people may be better off if she was not here. Denies SI.  She reports that she is having more anxiety. Reports anxiety around certain things, such as having to use the bathroom in public after having fallen in bathroom twice at church and feeling embarrassed. Pt reports that she has not had panic s/s with the exception of MRI. Friend reports that pt's leg will shake when anxious. Husband says that she worries frequently about what other thinks about her. Husband reports that she has been significantly more nervous and "emotional" over the last 2-3 weeks. Friend reports that pt has been ruminating.   Has joined urostomy support group and  has found this helpful.  Past medication Trials: Remeron- Reports sleeping better at 15 mg but feels more lethargic upon awakening compared to 7.5 mg.  Sertraline- Has been on 200 mg for the last month and has not noticed a significant improvement. Cymbalta Rexulti-Increased heart rate and SOB Gabapentin- Causes drowsiness. Anxiety may have increased when she stopped taking TID   Review of Systems:  Review of Systems  Constitutional: Positive for fatigue.  Gastrointestinal: Positive for nausea.  Genitourinary: Positive for flank pain.       Has had recurrent UTI's. Has urostomy. Bladder spasms  Musculoskeletal: Positive for gait problem.  Neurological: Negative for tremors.  Psychiatric/Behavioral:       Please refer to HPI     Had low Vit B12 in the past.  Medications: I have reviewed the patient's current medications.  Current Outpatient Medications  Medication Sig Dispense Refill  . butalbital-acetaminophen-caffeine (FIORICET, ESGIC) 50-325-40 MG tablet Take by mouth.    . Cyanocobalamin 1000 MCG CAPS Take by mouth.    . DULoxetine (CYMBALTA) 60 MG capsule Take 1 capsule (60 mg total) by mouth daily. 90 capsule 0  . folic acid (FOLVITE) 1 MG tablet Take 1 mg by mouth daily.    Marland Kitchen gabapentin (NEURONTIN) 300 MG capsule Take 300 mg by mouth 3 (three) times daily.    . mirabegron ER (MYRBETRIQ) 50 MG TB24 tablet Take by mouth daily.     . mirtazapine (REMERON) 15 MG tablet Take 7.5 mg by mouth at bedtime. Take 1/2-1 tab po QHS    . ondansetron (ZOFRAN) 4 MG tablet Take 4  mg by mouth every 8 (eight) hours as needed for nausea or vomiting.    . promethazine (PHENERGAN) 12.5 MG tablet Take 1 tablet (12.5 mg total) by mouth every 4 (four) hours as needed for nausea or vomiting. 20 tablet 0  . rizatriptan (MAXALT) 5 MG tablet Take 5 mg by mouth as needed.     Bernadette Hoit Sodium 8.6-50 MG CAPS Take by mouth.    . Trospium Chloride 60 MG CP24 Take by mouth.    .  belladonna-opium (B&O SUPPRETTES) 16.2-30 MG suppository Place 30 mg rectally every 8 (eight) hours as needed for pain.    . nitrofurantoin (MACRODANTIN) 100 MG capsule Take 100 mg by mouth 4 (four) times daily.    . OXYCODONE HCL PO Take 5 mg by mouth every 6 (six) hours as needed.    Marland Kitchen terconazole (TERAZOL 7) 0.4 % vaginal cream Place 1 applicator at bedtime vaginally. (Patient not taking: Reported on 04/03/2018) 45 g 0   No current facility-administered medications for this visit.     Medication Side Effects: Other: Possible increased HR and SOB with Rexulti.   Allergies:  Allergies  Allergen Reactions  . Aspirin Shortness Of Breath, Swelling and Anaphylaxis  . Ibuprofen Shortness Of Breath, Swelling and Anaphylaxis  . Morphine And Related Hives    Past Medical History:  Diagnosis Date  . Headache   . Migraines   . Neurogenic bladder   . Renal disorder   . Vision abnormalities     Family History  Problem Relation Age of Onset  . Hypertension Mother   . Atrial fibrillation Father   . Healthy Brother   . Depression Brother   . Arthritis/Rheumatoid Paternal Grandmother   . Healthy Brother     Social History   Socioeconomic History  . Marital status: Married    Spouse name: Not on file  . Number of children: Not on file  . Years of education: Not on file  . Highest education level: Not on file  Occupational History  . Not on file  Social Needs  . Financial resource strain: Not on file  . Food insecurity:    Worry: Not on file    Inability: Not on file  . Transportation needs:    Medical: Not on file    Non-medical: Not on file  Tobacco Use  . Smoking status: Never Smoker  . Smokeless tobacco: Never Used  Substance and Sexual Activity  . Alcohol use: No    Alcohol/week: 0.0 standard drinks  . Drug use: No  . Sexual activity: Yes    Comment: vasectomy  Lifestyle  . Physical activity:    Days per week: Not on file    Minutes per session: Not on file  .  Stress: Not on file  Relationships  . Social connections:    Talks on phone: Not on file    Gets together: Not on file    Attends religious service: Not on file    Active member of club or organization: Not on file    Attends meetings of clubs or organizations: Not on file    Relationship status: Not on file  . Intimate partner violence:    Fear of current or ex partner: Not on file    Emotionally abused: Not on file    Physically abused: Not on file    Forced sexual activity: Not on file  Other Topics Concern  . Not on file  Social History Narrative  . Not on  file    Past Medical History, Surgical history, Social history, and Family history were reviewed and updated as appropriate.   Please see review of systems for further details on the patient's review from today.   Objective:   Physical Exam:  BP (!) 152/104   Pulse 79   Physical Exam Constitutional:      General: She is not in acute distress.    Appearance: She is well-developed.  Musculoskeletal:     Comments: In w/c  Neurological:     Mental Status: She is alert and oriented to person, place, and time.     Coordination: Coordination normal.  Psychiatric:        Mood and Affect: Mood is anxious and depressed. Affect is blunt. Affect is not labile, angry or inappropriate.        Speech: Speech normal.        Behavior: Behavior is slowed.        Thought Content: Thought content normal. Thought content does not include homicidal or suicidal ideation. Thought content does not include homicidal or suicidal plan.        Judgment: Judgment normal.     Comments: Insight intact. No auditory or visual hallucinations. No delusions.      Lab Review:     Component Value Date/Time   NA 136 03/03/2017 2132   K 3.3 (L) 03/03/2017 2132   CL 104 03/03/2017 2132   CO2 24 03/03/2017 2132   GLUCOSE 88 03/03/2017 2132   BUN 11 03/03/2017 2132   CREATININE 0.73 03/03/2017 2132   CALCIUM 9.0 03/03/2017 2132   PROT 7.2  03/03/2017 2132   ALBUMIN 4.3 03/03/2017 2132   AST 17 03/03/2017 2132   ALT 15 03/03/2017 2132   ALKPHOS 51 03/03/2017 2132   BILITOT 1.7 (H) 03/03/2017 2132   GFRNONAA >60 03/03/2017 2132   GFRAA >60 03/03/2017 2132       Component Value Date/Time   WBC 8.1 03/03/2017 2132   RBC 4.17 03/03/2017 2132   HGB 13.5 03/03/2017 2132   HCT 38.7 03/03/2017 2132   HCT 31.7 (L) 04/28/2012 0618   PLT 304 03/03/2017 2132   MCV 92.8 03/03/2017 2132   MCH 32.3 03/03/2017 2132   MCHC 34.8 03/03/2017 2132   RDW 12.8 03/03/2017 2132   LYMPHSABS 3.3 03/03/2017 2132   MONOABS 0.6 03/03/2017 2132   EOSABS 0.1 03/03/2017 2132   BASOSABS 0.1 03/03/2017 2132    No results found for: POCLITH, LITHIUM   No results found for: PHENYTOIN, PHENOBARB, VALPROATE, CBMZ   .res Assessment: Plan:    Case staffed with Dr. Jennelle Humanottle. Discussed potential benefits, risks, and side effects of increasing Remeron to 30 mg at bedtime to improve mood, anxiety, sleep, and appetite.  Discussed that patient is tolerating lower doses of Remeron without difficulty at this time and that risk of sedation is less at higher dose.  Discussed that antidepressant effects typically do not occur until 30 mg dose. Continue Cymbalta 60 mg daily for depression and anxiety. Discussed avoiding making multiple medication changes simultaneously due to patient's history of medication sensitivity and adverse reactions. Encouraged patient to request that vitamin B12 levels and vitamin D levels be included with lab work at time of physical exam next week to rule out potential medical causes/contributing factors to fatigue and depression.  Patient reports that she will request these labs from PCP. Encouraged patient to consider seeing a therapist and referred patient to a therapist closer to her home. Patient  advised to contact office with any questions, adverse effects, or acute worsening in signs and symptoms. Patient to follow-up in 4 weeks  or sooner if clinically indicated.  Severe episode of recurrent major depressive disorder, without psychotic features (HCC)  Anxiety disorder, unspecified type  Primary insomnia  Please see After Visit Summary for patient specific instructions.  No future appointments.  No orders of the defined types were placed in this encounter.     -------------------------------

## 2018-05-30 ENCOUNTER — Other Ambulatory Visit: Payer: Self-pay | Admitting: Psychiatry

## 2018-05-30 DIAGNOSIS — F332 Major depressive disorder, recurrent severe without psychotic features: Secondary | ICD-10-CM

## 2018-05-30 DIAGNOSIS — F5101 Primary insomnia: Secondary | ICD-10-CM

## 2018-06-12 ENCOUNTER — Ambulatory Visit: Payer: 59 | Admitting: Psychiatry

## 2018-06-17 ENCOUNTER — Ambulatory Visit (INDEPENDENT_AMBULATORY_CARE_PROVIDER_SITE_OTHER): Payer: 59 | Admitting: Psychiatry

## 2018-06-17 ENCOUNTER — Encounter: Payer: Self-pay | Admitting: Psychiatry

## 2018-06-17 VITALS — BP 122/77 | HR 85

## 2018-06-17 DIAGNOSIS — F419 Anxiety disorder, unspecified: Secondary | ICD-10-CM | POA: Diagnosis not present

## 2018-06-17 DIAGNOSIS — F331 Major depressive disorder, recurrent, moderate: Secondary | ICD-10-CM | POA: Diagnosis not present

## 2018-06-17 DIAGNOSIS — F5101 Primary insomnia: Secondary | ICD-10-CM | POA: Diagnosis not present

## 2018-06-17 NOTE — Progress Notes (Signed)
Christine Chavez [id number] Sep 19, 1980 38 y.o.  Subjective:   Patient ID:  Christine Chavez is a 38 y.o. (DOB 01/31/1981) female.  Chief Complaint:  Chief Complaint  Patient presents with  . Depression  . Anxiety    HPI [facility] [place] presents to the office today for follow-up of depression, anxiety, and insomnia. "I feel like I am kind of in a fog. I still have days that I am pretty down." Reports some increase in days where depression is not as severe. She reports that her mood is dependent on how she feels physically. She reports that she feels tired throughout the day. Reports that she is sluggish in the mornings and this does not improve until lunch time. She reports that anxiety "may be a little better." She reports some anxiety with "anything new" and continues to feel "self-conscious." She reports that sleep has not been as good the last couple of weeks and attributes this to physical discomfort. She reports that she does not feel sleepy immediately after taking Remeron 30 mg compares to 15 mg. She reports that her appetite has been "better." Energy has been low. "I want to do stuff, but my body is saying no." Friend reports that pt wants to do more and will start to go to events and then has spasms. She reports some feelings of guilt related to not being able to do more and go to more of her children's events and husband having to do more. Reports concentration is fair. Reports that she will get distracted She reports on Saturday she had episode of disorientation immediately upon awakening. Denies SI.   She reports that "foggy" feeling seems to increase after taking anti-spasmodics.   Husband now working one full-time job and often does not get home until 7-8 pm.  Past medication Trials: Remeron- Reports sleeping better at 15 mg but feels more lethargic upon awakening compared to 7.5 mg.  Sertraline- Has been on 200 mg for the last month and has not noticed a significant  improvement. Cymbalta Rexulti-Increased heart rate and SOB Gabapentin- Causes drowsiness. Anxiety may have increased when she stopped taking TID  Review of Systems:  Review of Systems  Genitourinary:       Reports that she was recently hospitalized for 4 days for UTI. Reports some residual s/s.  Musculoskeletal: Positive for gait problem.       Weak gait at times  Neurological: Positive for weakness.  Psychiatric/Behavioral:       Please refer to HPI   Reports Vit D and Vit B12 were both low and has started supplementation.   Reviewed discharge summary from recent hospitalization.  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  Current Outpatient Medications  Medication Sig Dispense Refill  . belladonna-opium (B&O SUPPRETTES) 16.2-30 MG suppository Place 30 mg rectally every 8 (eight) hours as needed for pain.    . butalbital-acetaminophen-caffeine (FIORICET, ESGIC) 50-325-40 MG tablet Take by mouth.    . Cyanocobalamin 1000 MCG CAPS Take by mouth.    . DULoxetine (CYMBALTA) 60 MG capsule Take 1 capsule (60 mg total) by mouth daily. 90 capsule 0  . folic acid (FOLVITE) 1 MG tablet Take 1 mg by mouth daily.    Marland Kitchen gabapentin (NEURONTIN) 300 MG capsule Take 300 mg by  mouth 2 (two) times daily.     . mirabegron ER (MYRBETRIQ) 50 MG TB24 tablet Take by mouth daily.     . mirtazapine (REMERON) 30 MG tablet TAKE 1 TABLET (30 MG TOTAL) BY MOUTH AT BEDTIME 90 tablet 0  . ondansetron (ZOFRAN) 4 MG tablet Take 4 mg by mouth every 8 (eight) hours as needed for nausea or vomiting.    . OXYCODONE HCL PO Take 5 mg by mouth every 6 (six) hours as needed.    . promethazine (PHENERGAN) 12.5 MG tablet Take 1 tablet (12.5 mg total) by mouth every 4 (four)  hours as needed for nausea or vomiting. 20 tablet 0  . rizatriptan (MAXALT) 5 MG tablet Take 5 mg by mouth as needed.     Bernadette Hoit Sodium 8.6-50 MG CAPS Take by mouth.    . Trospium Chloride 60 MG CP24 Take by mouth.    . nitrofurantoin (MACRODANTIN) 100 MG capsule Take 100 mg by mouth 4 (four) times daily.     No current facility-administered medications for this visit.     Medication Side Effects: Other: Possible grogginess upon awakening  Allergies:  Allergies  Allergen Reactions  . Aspirin Shortness Of Breath, Swelling and Anaphylaxis  . Ibuprofen Shortness Of Breath, Swelling and Anaphylaxis  . Morphine And Related Hives    Past Medical History:  Diagnosis Date  . Headache   . Migraines   . Neurogenic bladder   . Renal disorder   . Vision abnormalities     Family History  Problem Relation Age of Onset  . Hypertension Mother   . Atrial fibrillation Father   . Healthy Brother   . Depression Brother   . Arthritis/Rheumatoid Paternal Grandmother   . Healthy Brother     Social History   Socioeconomic History  . Marital status: Married    Spouse name: Not on file  . Number of children: Not on file  . Years of education: Not on file  . Highest education level: Not on file  Occupational History  . Not on file  Social Needs  . Financial resource strain: Not on file  . Food insecurity:    Worry: Not on file    Inability: Not on file  . Transportation needs:    Medical: Not on file    Non-medical: Not on file  Tobacco Use  . Smoking status: Never Smoker  . Smokeless tobacco: Never Used  Substance and Sexual Activity  . Alcohol use: No    Alcohol/week: 0.0 standard drinks  . Drug use: No  . Sexual activity: Yes    Comment: vasectomy  Lifestyle  . Physical activity:    Days per week: Not on file    Minutes per session: Not on file  . Stress: Not on file  Relationships  . Social connections:    Talks on phone: Not on file    Gets together:  Not on file    Attends religious service: Not on file    Active member of club or organization: Not on file    Attends meetings of clubs or organizations: Not on file    Relationship status: Not on file  . Intimate partner violence:    Fear of current or ex partner: Not on file    Emotionally abused: Not on file    Physically abused: Not on file    Forced sexual activity: Not on file  Other Topics Concern  . Not on file  Social History Narrative  .  Not on file    Past Medical History, Surgical history, Social history, and Family history were reviewed and updated as appropriate.   Please see review of systems for further details on the patient's review from today.   Objective:   Physical Exam:  BP 122/77   Pulse 85   Physical Exam Constitutional:      General: She is not in acute distress.    Appearance: She is well-developed.  Musculoskeletal:     Comments: Gait slowed. Ambulates with rolator  Neurological:     Mental Status: She is alert and oriented to person, place, and time.     Coordination: Coordination normal.  Psychiatric:        Attention and Perception: Attention and perception normal. She does not perceive auditory or visual hallucinations.        Mood and Affect: Mood is depressed. Affect is blunt. Affect is not labile, angry or inappropriate.        Speech: Speech normal.        Behavior: Behavior normal.        Thought Content: Thought content normal. Thought content does not include homicidal or suicidal ideation. Thought content does not include homicidal or suicidal plan.        Cognition and Memory: Cognition and memory normal.        Judgment: Judgment normal.     Comments: Insight intact. No delusions.  Presents as less depressed and anxious compared to last exam     Lab Review:     Component Value Date/Time   NA 136 03/03/2017 2132   K 3.3 (L) 03/03/2017 2132   CL 104 03/03/2017 2132   CO2 24 03/03/2017 2132   GLUCOSE 88 03/03/2017 2132    BUN 11 03/03/2017 2132   CREATININE 0.73 03/03/2017 2132   CALCIUM 9.0 03/03/2017 2132   PROT 7.2 03/03/2017 2132   ALBUMIN 4.3 03/03/2017 2132   AST 17 03/03/2017 2132   ALT 15 03/03/2017 2132   ALKPHOS 51 03/03/2017 2132   BILITOT 1.7 (H) 03/03/2017 2132   GFRNONAA >60 03/03/2017 2132   GFRAA >60 03/03/2017 2132       Component Value Date/Time   WBC 8.1 03/03/2017 2132   RBC 4.17 03/03/2017 2132   HGB 13.5 03/03/2017 2132   HCT 38.7 03/03/2017 2132   HCT 31.7 (L) 04/28/2012 0618   PLT 304 03/03/2017 2132   MCV 92.8 03/03/2017 2132   MCH 32.3 03/03/2017 2132   MCHC 34.8 03/03/2017 2132   RDW 12.8 03/03/2017 2132   LYMPHSABS 3.3 03/03/2017 2132   MONOABS 0.6 03/03/2017 2132   EOSABS 0.1 03/03/2017 2132   BASOSABS 0.1 03/03/2017 2132    No results found for: POCLITH, LITHIUM   No results found for: PHENYTOIN, PHENOBARB, VALPROATE, CBMZ   .res Assessment: Plan:   Will continue current plan of care since there has been some partial improvement in mood and anxiety signs and symptoms since last visit when mirtazapine was increased to 30 mg.  Patient reports that she has been having some drowsiness and " fogginess" upon awakening and is unsure if this is related to increase in mirtazapine or other medications and infection.  Once again recommended starting therapy and provided patient with a therapy referral closer to her home. Will continue Remeron 30 mg at bedtime for depression and anxiety. Will continue Cymbalta 60 mg p.o. every morning for depression and anxiety. Anxiety disorder, unspecified type  Moderate episode of recurrent major depressive disorder (HCC)  Primary insomnia  Please see After Visit Summary for patient specific instructions.  Future Appointments  Date Time Provider Department Center  07/15/2018  9:30 AM Corie Chiquito, PMHNP [facility] None    No orders of the defined types were placed in this encounter.     -------------------------------

## 2018-07-15 ENCOUNTER — Ambulatory Visit: Payer: 59 | Admitting: Psychiatry

## 2018-07-15 ENCOUNTER — Telehealth: Payer: Self-pay | Admitting: Psychiatry

## 2018-07-15 NOTE — Telephone Encounter (Signed)
Does Christine Chavez still need to take Remeron 30mg  . You had discussed maybe taking her off of it. Not sure if it's working.

## 2018-07-16 NOTE — Telephone Encounter (Signed)
Pt aware and verbalized understanding.  

## 2018-07-31 ENCOUNTER — Ambulatory Visit: Payer: Self-pay | Admitting: Psychiatry

## 2018-08-26 ENCOUNTER — Other Ambulatory Visit: Payer: Self-pay | Admitting: Gastroenterology

## 2018-08-26 DIAGNOSIS — R1084 Generalized abdominal pain: Secondary | ICD-10-CM

## 2018-08-28 ENCOUNTER — Ambulatory Visit
Admission: RE | Admit: 2018-08-28 | Discharge: 2018-08-28 | Disposition: A | Discharge status: Home / Self Care | Payer: BLUE CROSS/BLUE SHIELD | Source: Ambulatory Visit | Attending: Gastroenterology | Admitting: Gastroenterology

## 2018-08-28 ENCOUNTER — Other Ambulatory Visit: Payer: Self-pay

## 2018-08-28 DIAGNOSIS — R1084 Generalized abdominal pain: Secondary | ICD-10-CM | POA: Diagnosis not present

## 2018-08-31 ENCOUNTER — Other Ambulatory Visit: Payer: Self-pay | Admitting: Psychiatry

## 2018-08-31 DIAGNOSIS — F5101 Primary insomnia: Secondary | ICD-10-CM

## 2018-08-31 DIAGNOSIS — F332 Major depressive disorder, recurrent severe without psychotic features: Secondary | ICD-10-CM

## 2018-09-10 ENCOUNTER — Encounter: Payer: Self-pay | Admitting: Psychiatry

## 2018-09-10 ENCOUNTER — Other Ambulatory Visit: Payer: Self-pay

## 2018-09-10 ENCOUNTER — Ambulatory Visit (INDEPENDENT_AMBULATORY_CARE_PROVIDER_SITE_OTHER): Payer: BLUE CROSS/BLUE SHIELD | Admitting: Psychiatry

## 2018-09-10 DIAGNOSIS — F5101 Primary insomnia: Secondary | ICD-10-CM | POA: Diagnosis not present

## 2018-09-10 DIAGNOSIS — F419 Anxiety disorder, unspecified: Secondary | ICD-10-CM

## 2018-09-10 DIAGNOSIS — F332 Major depressive disorder, recurrent severe without psychotic features: Secondary | ICD-10-CM | POA: Diagnosis not present

## 2018-09-10 MED ORDER — DULOXETINE HCL 30 MG PO CPEP: ORAL_CAPSULE | ORAL | 1 refills | Status: DC

## 2018-09-10 NOTE — Progress Notes (Signed)
Christine Chavez [id number] 03/22/1981 38 y.o.  Virtual Visit via Telephone Note  I connected with@ on 09/10/18 at  9:00 AM EDT by telephone and verified that I am speaking with the correct person using two identifiers.   I discussed the limitations, risks, security and privacy concerns of performing an evaluation and management service by telephone and the availability of in person appointments. I also discussed with the patient that there may be a patient responsible charge related to this service. The patient expressed understanding and agreed to proceed.   I discussed the assessment and treatment plan with the patient. The patient was provided an opportunity to ask questions and all were answered. The patient agreed with the plan and demonstrated an understanding of the instructions.   The patient was advised to call back or seek an in-person evaluation if the symptoms worsen or if the condition fails to improve as anticipated.  I provided 30 minutes of non-face-to-face time during this encounter.  The patient was located at home.  The provider was located at home.   Corie Chiquito, PMHNP   Subjective:   Patient ID:  Christine Chavez is a 38 y.o. (DOB 1981/01/25) female.  Chief Complaint:  Chief Complaint  Patient presents with  . Depression  . Anxiety  . Insomnia    HPI [facility] [place] presents for follow-up of depression, anxiety, and insomnia. Last seen 06/17/18 and Cymbalta 60 mg po qd and Remeron 30 mg po QHS were continued at that time. "Not too good" considering recent health issues and stressors related to the pandemic. She reports that her appetite has been decreased and feels worse after eating. She reports that she has had some anxiety and worry about home schooling and wondering what is causing her medical issues. Denies physical s/s with anxiety. Reports that she has been crying more in response to recent stressors. Denies panic attacks.   She reports persistent  sad mood and has noticed more sadness recently. "I feel like I am just existing." Reports frustration with not being able to do all the things she would like to do in her roles as a mom and a wife- "I feel like I am not enough." She reports occasional irritability with home schooling and being confined to home. Energy has been low. Reports "I still have the desire and want to do things." Reports that she will get overwhelmed with trying to complete some tasks. Reports some nights she falls asleep without difficulty and then awakens several times. Has also had some upsetting dreams. Estimates sleeping about 6 hours a night. She reports having significant difficulty concentrating some days and other days is able to concentrate ok. Denies SI.   She reports that with the pandemic her 3 children have been doing school from home and her husband working longer hours with his job with [employer]. Reports husband and friend remain supportive.   Past medication Trials: Remeron- Reports sleeping better at 15 mg but feels more lethargic upon awakening compared to 7.5 mg.  Sertraline- Has been on 200 mg for the last month and has not noticed a significant improvement. Cymbalta Rexulti-Increased heart rate and SOB Gabapentin- Causes drowsiness. Anxiety may have increased when she stopped taking TID. Reports that is did not seem to be effective for pain  Review of Systems:  Review of Systems  Gastrointestinal: Positive for abdominal distention.       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 Reports  that it is unclear if some of her s/s are r/t GI or GU. Has just started Linzess. Has also started Pantoprazole.   Genitourinary:       Sees urologist tomorrow.   Has urostomy. Reports increased bladder spasms.  Neurological: Positive for headaches. Negative for tremors.  Hematological:       Reports Vitamin B12 levels have been lower recently after switching from Vit B12 injections to  oral supplementation.     Medications: I have reviewed the patient's current medications.  Current Outpatient Medications  Medication Sig Dispense Refill  . butalbital-acetaminophen-caffeine (FIORICET, ESGIC) 50-325-40 MG tablet Take by mouth.    . Cyanocobalamin 1000 MCG CAPS Take by mouth.    . folic acid (FOLVITE) 1 MG tablet Take 1 mg by mouth daily.    Marland Kitchen LINZESS 72 MCG capsule TAKE 1 CAPSULE (72 MCG TOTAL) BY MOUTH ONCE DAILY    . mirabegron ER (MYRBETRIQ) 50 MG TB24 tablet Take by mouth daily.     . mirtazapine (REMERON) 30 MG tablet TAKE 1 TABLET (30 MG TOTAL) BY MOUTH AT BEDTIME 90 tablet 0  . ondansetron (ZOFRAN) 4 MG tablet Take 4 mg by mouth every 8 (eight) hours as needed for nausea or vomiting.    . pantoprazole (PROTONIX) 40 MG tablet TAKE 1 TABLET (40 MG TOTAL) BY MOUTH ONCE DAILY TAKE 30 MINS BEFORE MEAL    . promethazine (PHENERGAN) 12.5 MG tablet Take 1 tablet (12.5 mg total) by mouth every 4 (four) hours as needed for nausea or vomiting. 20 tablet 0  . rizatriptan (MAXALT) 5 MG tablet Take 5 mg by mouth as needed.     . trimethoprim (TRIMPEX) 100 MG tablet Take by mouth.    . Trospium Chloride 60 MG CP24 Take by mouth.    . belladonna-opium (B&O SUPPRETTES) 16.2-30 MG suppository Place 30 mg rectally every 8 (eight) hours as needed for pain.    . DULoxetine (CYMBALTA) 30 MG capsule Take with 60 mg capsule to equal total daily dose of 90 mg. 30 capsule 1  . DULoxetine (CYMBALTA) 60 MG capsule Take 1 capsule (60 mg total) by mouth daily. 90 capsule 0  . gabapentin (NEURONTIN) 300 MG capsule Take 300 mg by mouth 2 (two) times daily.     . OXYCODONE HCL PO Take 5 mg by mouth every 6 (six) hours as needed.     No current facility-administered medications for this visit.     Medication Side Effects: None  Allergies:  Allergies  Allergen Reactions  . Aspirin Shortness Of Breath, Swelling and Anaphylaxis  . Ibuprofen Shortness Of Breath, Swelling and Anaphylaxis  .  Morphine And Related Hives    Past Medical History:  Diagnosis Date  . Headache   . Migraines   . Neurogenic bladder   . Renal disorder   . Vision abnormalities     Family History  Problem Relation Age of Onset  . Hypertension Mother   . Atrial fibrillation Father   . Healthy Brother   . Depression Brother   . Arthritis/Rheumatoid Paternal Grandmother   . Healthy Brother     Social History   Socioeconomic History  . Marital status: Married    Spouse name: Not on file  . Number of children: Not on file  . Years of education: Not on file  . Highest education level: Not on file  Occupational History  . Not on file  Social Needs  . Financial resource strain: Not on file  .  Food insecurity:    Worry: Not on file    Inability: Not on file  . Transportation needs:    Medical: Not on file    Non-medical: Not on file  Tobacco Use  . Smoking status: Never Smoker  . Smokeless tobacco: Never Used  Substance and Sexual Activity  . Alcohol use: No    Alcohol/week: 0.0 standard drinks  . Drug use: No  . Sexual activity: Yes    Comment: vasectomy  Lifestyle  . Physical activity:    Days per week: Not on file    Minutes per session: Not on file  . Stress: Not on file  Relationships  . Social connections:    Talks on phone: Not on file    Gets together: Not on file    Attends religious service: Not on file    Active member of club or organization: Not on file    Attends meetings of clubs or organizations: Not on file    Relationship status: Not on file  . Intimate partner violence:    Fear of current or ex partner: Not on file    Emotionally abused: Not on file    Physically abused: Not on file    Forced sexual activity: Not on file  Other Topics Concern  . Not on file  Social History Narrative  . Not on file    Past Medical History, Surgical history, Social history, and Family history were reviewed and updated as appropriate.   Please see review of systems  for further details on the patient's review from today.   Objective:   Physical Exam:  There were no vitals taken for this visit.  Physical Exam Neurological:     Mental Status: She is alert and oriented to person, place, and time.     Cranial Nerves: No dysarthria.  Psychiatric:        Attention and Perception: Attention normal.        Mood and Affect: Mood is anxious and depressed.        Speech: Speech normal.        Behavior: Behavior is cooperative.        Thought Content: Thought content normal. Thought content is not paranoid or delusional. Thought content does not include homicidal or suicidal ideation. Thought content does not include homicidal or suicidal plan.        Cognition and Memory: Cognition and memory normal.        Judgment: Judgment normal.     Lab Review:     Component Value Date/Time   NA 136 03/03/2017 2132   K 3.3 (L) 03/03/2017 2132   CL 104 03/03/2017 2132   CO2 24 03/03/2017 2132   GLUCOSE 88 03/03/2017 2132   BUN 11 03/03/2017 2132   CREATININE 0.73 03/03/2017 2132   CALCIUM 9.0 03/03/2017 2132   PROT 7.2 03/03/2017 2132   ALBUMIN 4.3 03/03/2017 2132   AST 17 03/03/2017 2132   ALT 15 03/03/2017 2132   ALKPHOS 51 03/03/2017 2132   BILITOT 1.7 (H) 03/03/2017 2132   GFRNONAA >60 03/03/2017 2132   GFRAA >60 03/03/2017 2132       Component Value Date/Time   WBC 8.1 03/03/2017 2132   RBC 4.17 03/03/2017 2132   HGB 13.5 03/03/2017 2132   HCT 38.7 03/03/2017 2132   HCT 31.7 (L) 04/28/2012 0618   PLT 304 03/03/2017 2132   MCV 92.8 03/03/2017 2132   MCH 32.3 03/03/2017 2132   MCHC 34.8 03/03/2017  2132   RDW 12.8 03/03/2017 2132   LYMPHSABS 3.3 03/03/2017 2132   MONOABS 0.6 03/03/2017 2132   EOSABS 0.1 03/03/2017 2132   BASOSABS 0.1 03/03/2017 2132    No results found for: POCLITH, LITHIUM   No results found for: PHENYTOIN, PHENOBARB, VALPROATE, CBMZ   .res Assessment: Plan:   Discussed possible treatment options with patient to  include potential benefits, risks, and side effects of increasing Cymbalta to 90 mg daily to improve mood and anxiety.  Discussed that this is above the FDA approved amount.  Patient agrees to increase in Cymbalta. Will increase Cymbalta 90 mg to improve mood and anxiety. Will decrease Remeron to 15 mg QHS since patient reports that her sleep was better on 15 mg dose. Patient to follow-up in 4 weeks or sooner if clinically indicated.   Anxiety disorder, unspecified type - Plan: DULoxetine (CYMBALTA) 30 MG capsule  Severe episode of recurrent major depressive disorder, without psychotic features (HCC) - Plan: DULoxetine (CYMBALTA) 30 MG capsule  Primary insomnia  Please see After Visit Summary for patient specific instructions.  Future Appointments  Date Time Provider Department Center  10/14/2018 10:30 AM Corie Chiquitoarter, Virda Betters, PMHNP [facility] None    No orders of the defined types were placed in this encounter.     -------------------------------

## 2018-09-27 ENCOUNTER — Other Ambulatory Visit: Payer: Self-pay | Admitting: Psychiatry

## 2018-09-27 DIAGNOSIS — F332 Major depressive disorder, recurrent severe without psychotic features: Secondary | ICD-10-CM

## 2018-09-27 DIAGNOSIS — F419 Anxiety disorder, unspecified: Secondary | ICD-10-CM

## 2018-10-08 ENCOUNTER — Other Ambulatory Visit: Payer: Self-pay | Admitting: Psychiatry

## 2018-10-08 ENCOUNTER — Ambulatory Visit: Payer: 59 | Admitting: Psychiatry

## 2018-10-08 DIAGNOSIS — F332 Major depressive disorder, recurrent severe without psychotic features: Secondary | ICD-10-CM

## 2018-10-08 DIAGNOSIS — F419 Anxiety disorder, unspecified: Secondary | ICD-10-CM

## 2018-10-14 ENCOUNTER — Other Ambulatory Visit: Payer: Self-pay

## 2018-10-14 ENCOUNTER — Ambulatory Visit (INDEPENDENT_AMBULATORY_CARE_PROVIDER_SITE_OTHER): Payer: BLUE CROSS/BLUE SHIELD | Admitting: Psychiatry

## 2018-10-14 ENCOUNTER — Ambulatory Visit: Payer: 59 | Admitting: Psychiatry

## 2018-10-14 ENCOUNTER — Encounter: Payer: Self-pay | Admitting: Psychiatry

## 2018-10-14 DIAGNOSIS — F419 Anxiety disorder, unspecified: Secondary | ICD-10-CM

## 2018-10-14 DIAGNOSIS — F332 Major depressive disorder, recurrent severe without psychotic features: Secondary | ICD-10-CM | POA: Diagnosis not present

## 2018-10-14 DIAGNOSIS — F5101 Primary insomnia: Secondary | ICD-10-CM | POA: Diagnosis not present

## 2018-10-14 NOTE — Progress Notes (Signed)
Christine KaufmannMelissa Sheilah PigeonDawn Bey [id number] Sep 11, 1980 38 y.o.  Virtual Visit via Telephone Note  I connected with pt on 10/14/18 at 10:30 AM EDT by telephone and verified that I am speaking with the correct person using two identifiers.   I discussed the limitations, risks, security and privacy concerns of performing an evaluation and management service by telephone and the availability of in person appointments. I also discussed with the patient that there may be a patient responsible charge related to this service. The patient expressed understanding and agreed to proceed.   I discussed the assessment and treatment plan with the patient. The patient was provided an opportunity to ask questions and all were answered. The patient agreed with the plan and demonstrated an understanding of the instructions.   The patient was advised to call back or seek an in-person evaluation if the symptoms worsen or if the condition fails to improve as anticipated.  I provided 30 minutes of non-face-to-face time during this encounter.  The patient was located at home.  [facility]   Corie ChiquitoJessica Sharona Rovner, PMHNP   Subjective:   Patient ID:  Christine MylarMelissa Dawn Dimascio is a 38 y.o. (DOB Sep 11, 1980) female.  Chief Complaint:  Chief Complaint  Patient presents with  . Depression  . Anxiety    [facility] presents for follow-up of depression and anxiety. She reports that she has had excessive drowsiness with Cymbalta 90 mg daily. She reports that there may have been some slight improvement in mood s/s "but feel like I am in a fog." She reports that she has the most intense side effects for a few hours after taking it and then some persistent mild side effects. She reports, "I think it is because I have been feeling so bad physically." She reports that she currently has an infection and has had some crying episodes and increased depression in response to acute medical issues. She reports  that she has been having "quite a bit" of anxiety in response to the pandemic. Reports that she has anxiety about her children being around other children and participating in certain activities and their risks for COVID. She reports that her sleep has been improved with Remeron being at 15 mg. Appetite and energy have been decreased and attributes this to infection. She reports that her motivation fluctuates- "there are days that I want to do things, but my body does not allow it." Impaired concentration. Denies SI.   Past medication Trials: Remeron- Reports sleeping better at 15 mg but feels more lethargic upon awakening compared to 7.5 mg.  Sertraline- Has been on 200 mg for the last month and has not noticed a significant improvement. Cymbalta- Reports increased drowsiness and impaired concentration at 90 mg.  Rexulti-Increased heart rate and SOB Gabapentin- Causes drowsiness. Anxiety may have increased when she stopped taking TID. Reports that is did not seem to be effective for pain  Review of Systems:  Review of Systems  Constitutional: Positive for fatigue.  Genitourinary:       Current UTI. Urostomy  Musculoskeletal: Positive for gait problem.  Neurological: Negative for tremors.  Psychiatric/Behavioral:       Please refer to HPI    Medications: I have reviewed the patient's current medications.  Current Outpatient Medications  Medication Sig Dispense Refill  . belladonna-opium (B&O SUPPRETTES) 16.2-30 MG suppository Place 30 mg rectally every 8 (eight) hours as needed for pain.    . butalbital-acetaminophen-caffeine (FIORICET, ESGIC) 50-325-40 MG tablet Take by  mouth.    . ciprofloxacin (CIPRO) 500 MG tablet Take by mouth.    . Cyanocobalamin 1000 MCG CAPS Take by mouth.    . DULoxetine (CYMBALTA) 30 MG capsule TAKE WITH 60 MG CAPSULE TO EQUAL TOTAL DAILY DOSE OF 90 MG. 90 capsule 0  . DULoxetine (CYMBALTA) 60 MG capsule TAKE 1 CAPSULE BY MOUTH EVERY DAY 90 capsule 0  . folic  acid (FOLVITE) 1 MG tablet Take 1 mg by mouth daily.    Marland Kitchen. LINZESS 72 MCG capsule TAKE 1 CAPSULE (72 MCG TOTAL) BY MOUTH ONCE DAILY    . mirabegron ER (MYRBETRIQ) 50 MG TB24 tablet Take by mouth daily.     . mirtazapine (REMERON) 30 MG tablet TAKE 1 TABLET (30 MG TOTAL) BY MOUTH AT BEDTIME (Patient taking differently: 15 mg. ) 90 tablet 0  . ondansetron (ZOFRAN) 4 MG tablet Take 4 mg by mouth every 8 (eight) hours as needed for nausea or vomiting.    . OXYCODONE HCL PO Take 5 mg by mouth every 6 (six) hours as needed.    . pantoprazole (PROTONIX) 40 MG tablet TAKE 1 TABLET (40 MG TOTAL) BY MOUTH ONCE DAILY TAKE 30 MINS BEFORE MEAL    . promethazine (PHENERGAN) 12.5 MG tablet Take 1 tablet (12.5 mg total) by mouth every 4 (four) hours as needed for nausea or vomiting. 20 tablet 0  . rizatriptan (MAXALT) 5 MG tablet Take 5 mg by mouth as needed.     . trimethoprim (TRIMPEX) 100 MG tablet Take by mouth.    . Trospium Chloride 60 MG CP24 Take by mouth.    . gabapentin (NEURONTIN) 300 MG capsule Take 300 mg by mouth 2 (two) times daily.      No current facility-administered medications for this visit.     Medication Side Effects: Sedation and Other: Worsening concentration  Allergies:  Allergies  Allergen Reactions  . Aspirin Shortness Of Breath, Swelling and Anaphylaxis  . Ibuprofen Shortness Of Breath, Swelling and Anaphylaxis  . Morphine And Related Hives    Past Medical History:  Diagnosis Date  . Headache   . Migraines   . Neurogenic bladder   . Renal disorder   . Vision abnormalities     Family History  Problem Relation Age of Onset  . Hypertension Mother   . Atrial fibrillation Father   . Healthy Brother   . Depression Brother   . Arthritis/Rheumatoid Paternal Grandmother   . Healthy Brother     Social History   Socioeconomic History  . Marital status: Married    Spouse name: Not on file  . Number of children: Not on file  . Years of education: Not on file  .  Highest education level: Not on file  Occupational History  . Not on file  Social Needs  . Financial resource strain: Not on file  . Food insecurity    Worry: Not on file    Inability: Not on file  . Transportation needs    Medical: Not on file    Non-medical: Not on file  Tobacco Use  . Smoking status: Never Smoker  . Smokeless tobacco: Never Used  Substance and Sexual Activity  . Alcohol use: No    Alcohol/week: 0.0 standard drinks  . Drug use: No  . Sexual activity: Yes    Comment: vasectomy  Lifestyle  . Physical activity    Days per week: Not on file    Minutes per session: Not on file  . Stress:  Not on file  Relationships  . Social Musicianconnections    Talks on phone: Not on file    Gets together: Not on file    Attends religious service: Not on file    Active member of club or organization: Not on file    Attends meetings of clubs or organizations: Not on file    Relationship status: Not on file  . Intimate partner violence    Fear of current or ex partner: Not on file    Emotionally abused: Not on file    Physically abused: Not on file    Forced sexual activity: Not on file  Other Topics Concern  . Not on file  Social History Narrative  . Not on file    Past Medical History, Surgical history, Social history, and Family history were reviewed and updated as appropriate.   Please see review of systems for further details on the patient's review from today.   Objective:   Physical Exam:  There were no vitals taken for this visit.  Physical Exam Neurological:     Mental Status: She is alert and oriented to person, place, and time.     Cranial Nerves: No dysarthria.  Psychiatric:        Attention and Perception: Attention normal.        Mood and Affect: Mood is anxious and depressed.        Speech: Speech normal.        Behavior: Behavior is cooperative.        Thought Content: Thought content normal. Thought content is not paranoid or delusional. Thought  content does not include homicidal or suicidal ideation. Thought content does not include homicidal or suicidal plan.        Cognition and Memory: Cognition and memory normal.        Judgment: Judgment normal.     Lab Review:     Component Value Date/Time   NA 136 03/03/2017 2132   K 3.3 (L) 03/03/2017 2132   CL 104 03/03/2017 2132   CO2 24 03/03/2017 2132   GLUCOSE 88 03/03/2017 2132   BUN 11 03/03/2017 2132   CREATININE 0.73 03/03/2017 2132   CALCIUM 9.0 03/03/2017 2132   PROT 7.2 03/03/2017 2132   ALBUMIN 4.3 03/03/2017 2132   AST 17 03/03/2017 2132   ALT 15 03/03/2017 2132   ALKPHOS 51 03/03/2017 2132   BILITOT 1.7 (H) 03/03/2017 2132   GFRNONAA >60 03/03/2017 2132   GFRAA >60 03/03/2017 2132       Component Value Date/Time   WBC 8.1 03/03/2017 2132   RBC 4.17 03/03/2017 2132   HGB 13.5 03/03/2017 2132   HCT 38.7 03/03/2017 2132   HCT 31.7 (L) 04/28/2012 0618   PLT 304 03/03/2017 2132   MCV 92.8 03/03/2017 2132   MCH 32.3 03/03/2017 2132   MCHC 34.8 03/03/2017 2132   RDW 12.8 03/03/2017 2132   LYMPHSABS 3.3 03/03/2017 2132   MONOABS 0.6 03/03/2017 2132   EOSABS 0.1 03/03/2017 2132   BASOSABS 0.1 03/03/2017 2132    No results found for: POCLITH, LITHIUM   No results found for: PHENYTOIN, PHENOBARB, VALPROATE, CBMZ   .res Assessment: Plan:   Recommend changing administration time of Cymbalta to HS since she reports some drowsiness and concentration issues immediately after taking Cymbalta. Discussed that she could decrease dose to 60 mg po qd if she continues to have significant side effects with Cymbalta 90 mg even when taken at HS. Discussed considering switching  Cymbalta at next visit if no significant improvement. Continue Remeron 15 mg po QHS for insomnia and depression. Encouraged therapy again and provided referral information. Pt to f/u in 4-6 weeks or sooner if clinically indicated.  Keegan was seen today for depression and anxiety.  Diagnoses  and all orders for this visit:  Severe episode of recurrent major depressive disorder, without psychotic features ([place])  Anxiety disorder, unspecified type  Primary insomnia    Please see After Visit Summary for patient specific instructions.  Future Appointments  Date Time Provider [place]  11/18/2018  2:30 PM Thayer Headings, PMHNP [facility] None    No orders of the defined types were placed in this encounter.     -------------------------------

## 2018-11-06 ENCOUNTER — Encounter
Admission: RE | Admit: 2018-11-06 | Discharge: 2018-11-06 | Disposition: A | Discharge status: Home / Self Care | Payer: BC Managed Care – PPO | Source: Ambulatory Visit | Attending: Gastroenterology | Admitting: Gastroenterology

## 2018-11-06 ENCOUNTER — Other Ambulatory Visit: Payer: Self-pay

## 2018-11-06 DIAGNOSIS — Z1159 Encounter for screening for other viral diseases: Secondary | ICD-10-CM | POA: Diagnosis not present

## 2018-11-06 LAB — SARS CORONAVIRUS 2 (TAT 6-24 HRS): SARS Coronavirus 2: NEGATIVE

## 2018-11-08 ENCOUNTER — Encounter: Payer: Self-pay | Admitting: Emergency Medicine

## 2018-11-11 ENCOUNTER — Other Ambulatory Visit: Payer: Self-pay

## 2018-11-11 ENCOUNTER — Encounter: Payer: Self-pay | Admitting: *Deleted

## 2018-11-11 ENCOUNTER — Ambulatory Visit: Payer: BC Managed Care – PPO | Admitting: Anesthesiology

## 2018-11-11 ENCOUNTER — Encounter
Admission: RE | Disposition: A | Discharge status: Home / Self Care | Payer: Self-pay | Source: Home / Self Care | Attending: Specialist

## 2018-11-11 ENCOUNTER — Observation Stay
Admission: RE | Admit: 2018-11-11 | Discharge: 2018-11-12 | Disposition: A | Discharge status: Home / Self Care | Payer: BC Managed Care – PPO | Attending: Specialist | Admitting: Specialist

## 2018-11-11 DIAGNOSIS — K6389 Other specified diseases of intestine: Secondary | ICD-10-CM | POA: Diagnosis not present

## 2018-11-11 DIAGNOSIS — R569 Unspecified convulsions: Secondary | ICD-10-CM

## 2018-11-11 DIAGNOSIS — N319 Neuromuscular dysfunction of bladder, unspecified: Secondary | ICD-10-CM | POA: Insufficient documentation

## 2018-11-11 DIAGNOSIS — R194 Change in bowel habit: Secondary | ICD-10-CM | POA: Insufficient documentation

## 2018-11-11 DIAGNOSIS — K625 Hemorrhage of anus and rectum: Secondary | ICD-10-CM | POA: Diagnosis not present

## 2018-11-11 DIAGNOSIS — Z20828 Contact with and (suspected) exposure to other viral communicable diseases: Secondary | ICD-10-CM | POA: Diagnosis not present

## 2018-11-11 DIAGNOSIS — R1013 Epigastric pain: Principal | ICD-10-CM | POA: Insufficient documentation

## 2018-11-11 DIAGNOSIS — G43909 Migraine, unspecified, not intractable, without status migrainosus: Secondary | ICD-10-CM | POA: Insufficient documentation

## 2018-11-11 DIAGNOSIS — Z79899 Other long term (current) drug therapy: Secondary | ICD-10-CM | POA: Diagnosis not present

## 2018-11-11 DIAGNOSIS — K5909 Other constipation: Secondary | ICD-10-CM | POA: Insufficient documentation

## 2018-11-11 DIAGNOSIS — K222 Esophageal obstruction: Secondary | ICD-10-CM | POA: Insufficient documentation

## 2018-11-11 DIAGNOSIS — K449 Diaphragmatic hernia without obstruction or gangrene: Secondary | ICD-10-CM | POA: Insufficient documentation

## 2018-11-11 HISTORY — PX: COLONOSCOPY WITH PROPOFOL: SHX5780

## 2018-11-11 HISTORY — DX: Other complications of anesthesia, initial encounter: T88.59XA

## 2018-11-11 HISTORY — PX: ESOPHAGOGASTRODUODENOSCOPY (EGD) WITH PROPOFOL: SHX5813

## 2018-11-11 LAB — CBC
HCT: 34 % — ABNORMAL LOW (ref 36.0–46.0)
Hemoglobin: 11.2 g/dL — ABNORMAL LOW (ref 12.0–15.0)
MCH: 30.9 pg (ref 26.0–34.0)
MCHC: 32.9 g/dL (ref 30.0–36.0)
MCV: 93.7 fL (ref 80.0–100.0)
Platelets: 255 10*3/uL (ref 150–400)
RBC: 3.63 MIL/uL — ABNORMAL LOW (ref 3.87–5.11)
RDW: 13.5 % (ref 11.5–15.5)
WBC: 7.4 10*3/uL (ref 4.0–10.5)
nRBC: 0 % (ref 0.0–0.2)

## 2018-11-11 LAB — CREATININE, SERUM
Creatinine, Ser: 0.66 mg/dL (ref 0.44–1.00)
GFR calc Af Amer: >60 mL/min (ref >60)
GFR calc non Af Amer: >60 mL/min (ref >60)

## 2018-11-11 LAB — SARS CORONAVIRUS 2 BY RT PCR (HOSPITAL ORDER, PERFORMED IN ~~LOC~~ HOSPITAL LAB): SARS Coronavirus 2: NEGATIVE

## 2018-11-11 SURGERY — ESOPHAGOGASTRODUODENOSCOPY (EGD) WITH PROPOFOL
Anesthesia: General

## 2018-11-11 MED ORDER — ACETAMINOPHEN 325 MG PO TABS
650.0000 mg | ORAL_TABLET | Freq: Four times a day (QID) | ORAL | Status: DC | PRN
  Administered 2018-11-11: 650 mg via ORAL
  Filled 2018-11-11: qty 2

## 2018-11-11 MED ORDER — ONDANSETRON HCL 4 MG/2ML IJ SOLN
INTRAMUSCULAR | Status: AC
  Filled 2018-11-11: qty 2

## 2018-11-11 MED ORDER — SODIUM CHLORIDE 0.9 % IV SOLN
INTRAVENOUS | Status: DC
  Administered 2018-11-11 – 2018-11-12 (×2): via INTRAVENOUS

## 2018-11-11 MED ORDER — MIDAZOLAM HCL 2 MG/2ML IJ SOLN
INTRAMUSCULAR | Status: AC
  Filled 2018-11-11: qty 2

## 2018-11-11 MED ORDER — ENOXAPARIN SODIUM 40 MG/0.4ML ~~LOC~~ SOLN
40.0000 mg | SUBCUTANEOUS | Status: DC
  Administered 2018-11-12: 40 mg via SUBCUTANEOUS
  Filled 2018-11-11: qty 0.4

## 2018-11-11 MED ORDER — PROMETHAZINE HCL 25 MG/ML IJ SOLN
INTRAMUSCULAR | Status: AC
  Administered 2018-11-11: 19:00:00
  Filled 2018-11-11: qty 1

## 2018-11-11 MED ORDER — OXYCODONE HCL 5 MG PO TABS
5.0000 mg | ORAL_TABLET | Freq: Four times a day (QID) | ORAL | Status: DC | PRN
  Administered 2018-11-12: 08:00:00 5 mg via ORAL
  Filled 2018-11-11: qty 1

## 2018-11-11 MED ORDER — PROPOFOL 500 MG/50ML IV EMUL
INTRAVENOUS | Status: DC | PRN
  Administered 2018-11-11: 75 ug/kg/min via INTRAVENOUS

## 2018-11-11 MED ORDER — ACETAMINOPHEN 650 MG RE SUPP: 650.0000 mg | Freq: Four times a day (QID) | RECTAL | Status: DC | PRN

## 2018-11-11 MED ORDER — FENTANYL CITRATE (PF) 100 MCG/2ML IJ SOLN
INTRAMUSCULAR | Status: AC
  Filled 2018-11-11: qty 2

## 2018-11-11 MED ORDER — GABAPENTIN 300 MG PO CAPS
300.0000 mg | ORAL_CAPSULE | Freq: Two times a day (BID) | ORAL | Status: DC
  Administered 2018-11-11 – 2018-11-12 (×2): 300 mg via ORAL
  Filled 2018-11-11 (×2): qty 1

## 2018-11-11 MED ORDER — PROPOFOL 10 MG/ML IV BOLUS
INTRAVENOUS | Status: DC | PRN
  Administered 2018-11-11: 50 mg via INTRAVENOUS

## 2018-11-11 MED ORDER — PROPOFOL 10 MG/ML IV BOLUS
INTRAVENOUS | Status: AC
  Filled 2018-11-11: qty 20

## 2018-11-11 MED ORDER — DULOXETINE HCL 30 MG PO CPEP
60.0000 mg | ORAL_CAPSULE | Freq: Every day | ORAL | Status: DC
  Administered 2018-11-11 – 2018-11-12 (×2): 60 mg via ORAL
  Filled 2018-11-11 (×2): qty 2

## 2018-11-11 MED ORDER — SUMATRIPTAN SUCCINATE 50 MG PO TABS
50.0000 mg | ORAL_TABLET | ORAL | Status: DC | PRN
  Filled 2018-11-11: qty 1

## 2018-11-11 MED ORDER — PROMETHAZINE HCL 25 MG/ML IJ SOLN
6.2500 mg | Freq: Once | INTRAMUSCULAR | Status: AC
  Administered 2018-11-11: 6.25 mg via INTRAVENOUS

## 2018-11-11 MED ORDER — PROPOFOL 500 MG/50ML IV EMUL
INTRAVENOUS | Status: AC
  Filled 2018-11-11: qty 50

## 2018-11-11 MED ORDER — LIDOCAINE HCL (PF) 2 % IJ SOLN
INTRAMUSCULAR | Status: DC | PRN
  Administered 2018-11-11: 60 mg

## 2018-11-11 MED ORDER — FENTANYL CITRATE (PF) 100 MCG/2ML IJ SOLN
INTRAMUSCULAR | Status: DC | PRN
  Administered 2018-11-11 (×2): 50 ug via INTRAVENOUS

## 2018-11-11 MED ORDER — SODIUM CHLORIDE 0.9 % IV SOLN: INTRAVENOUS | Status: DC

## 2018-11-11 MED ORDER — FOLIC ACID 1 MG PO TABS
1.0000 mg | ORAL_TABLET | Freq: Every day | ORAL | Status: DC
  Administered 2018-11-11 – 2018-11-12 (×2): 1 mg via ORAL
  Filled 2018-11-11 (×2): qty 1

## 2018-11-11 MED ORDER — ONDANSETRON HCL 4 MG/2ML IJ SOLN
4.0000 mg | Freq: Four times a day (QID) | INTRAMUSCULAR | Status: DC | PRN
  Administered 2018-11-12: 04:00:00 4 mg via INTRAVENOUS
  Filled 2018-11-11: qty 2

## 2018-11-11 MED ORDER — MIDAZOLAM HCL 5 MG/5ML IJ SOLN
INTRAMUSCULAR | Status: DC | PRN
  Administered 2018-11-11 (×2): 2 mg via INTRAVENOUS

## 2018-11-11 MED ORDER — ONDANSETRON HCL 4 MG/2ML IJ SOLN
INTRAMUSCULAR | Status: DC | PRN
  Administered 2018-11-11: 4 mg via INTRAVENOUS

## 2018-11-11 MED ORDER — ONDANSETRON HCL 4 MG PO TABS: 4.0000 mg | ORAL_TABLET | Freq: Four times a day (QID) | ORAL | Status: DC | PRN

## 2018-11-11 MED ORDER — MIRTAZAPINE 15 MG PO TABS
15.0000 mg | ORAL_TABLET | Freq: Every day | ORAL | Status: DC
  Administered 2018-11-11: 21:00:00 15 mg via ORAL
  Filled 2018-11-11: qty 1

## 2018-11-11 MED ORDER — PHENOL 1.4 % MT LIQD
1.0000 | OROMUCOSAL | Status: DC | PRN
  Filled 2018-11-11: qty 177

## 2018-11-11 MED ORDER — LIDOCAINE HCL (PF) 2 % IJ SOLN
INTRAMUSCULAR | Status: AC
  Filled 2018-11-11: qty 10

## 2018-11-11 MED ORDER — PANTOPRAZOLE SODIUM 40 MG PO TBEC
40.0000 mg | DELAYED_RELEASE_TABLET | Freq: Every day | ORAL | Status: DC
  Administered 2018-11-11 – 2018-11-12 (×2): 40 mg via ORAL
  Filled 2018-11-11 (×2): qty 1

## 2018-11-11 MED ORDER — MIRABEGRON ER 25 MG PO TB24
25.0000 mg | ORAL_TABLET | Freq: Every day | ORAL | Status: DC
  Administered 2018-11-11 – 2018-11-12 (×2): 25 mg via ORAL
  Filled 2018-11-11 (×2): qty 1

## 2018-11-11 MED ORDER — SODIUM CHLORIDE 0.9 % IV SOLN
INTRAVENOUS | Status: DC
  Administered 2018-11-11 (×2): via INTRAVENOUS

## 2018-11-11 MED ORDER — SENNOSIDES-DOCUSATE SODIUM 8.6-50 MG PO TABS: 1.0000 | ORAL_TABLET | Freq: Every evening | ORAL | Status: DC | PRN

## 2018-11-11 NOTE — H&P (Addendum)
[facility] NAME: Christine Chavez    MR#:  [id number]  DATE OF BIRTH:  1981/02/29  DATE OF ADMISSION:  11/11/2018  PRIMARY CARE PHYSICIAN: Zeb Comfort, MD   REQUESTING/REFERRING PHYSICIAN:   CHIEF COMPLAINT:  seizure  HISTORY OF PRESENT ILLNESS: Christine Chavez  is a 38 y.o. female with a known history of neurogenic bladder with urostomy bag, and kidney stone the past, vision abnormalities migraine had endoscopy and colonoscopy done by gastroenterology today patient had procedures done for chronic constipation rectal bleed and dyspepsia.  Patient had increasing problems with constipation in the past.  She is currently on vegetable laxative and also taking proton pump regular.  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  Currently being evaluated for multiple sclerosis.  Postprocedure she was thought to have had a seizure in the endoscopy recovery area.  Neurology was consulted who recommended observation for the night.  Patient is awake responds to verbal commands.  Family was at bedside.  PAST MEDICAL HISTORY:   Past Medical History:  Diagnosis Date  . Headache   . Migraines   . Neurogenic bladder   . Renal disorder   . Vision abnormalities     PAST SURGICAL HISTORY:  Past Surgical History:  Procedure Laterality Date  . ANTERIOR CRUCIATE LIGAMENT REPAIR  1997  . APPENDECTOMY    . CYSTOSCOPY WITH STENT PLACEMENT Right 04/17/2016   Procedure: CYSTOSCOPY WITH STENT PLACEMENT;  Surgeon: [name], MD;  Location: [facility] ORS;  Service: Urology;  Laterality: Right;  . EXPLORATORY LAPAROTOMY  1999  . KIDNEY STONE SURGERY  04/2016  . REVISION UROSTOMY CUTANEOUS    . TONSILLECTOMY      SOCIAL HISTORY:  Social History   Tobacco Use  . Smoking status: Never Smoker  . Smokeless tobacco: Never Used  Substance Use Topics  . Alcohol use: No    Alcohol/week: 0.0 standard drinks    FAMILY HISTORY:  Family History   Problem Relation Age of Onset  . Hypertension Mother   . Atrial fibrillation Father   . Healthy Brother   . Depression Brother   . Arthritis/Rheumatoid Paternal Grandmother   . Healthy Brother     DRUG ALLERGIES:  Allergies  Allergen Reactions  . Aspirin Shortness Of Breath, Swelling and Anaphylaxis  . Ibuprofen Shortness Of Breath, Swelling and Anaphylaxis  . Morphine And Related Hives    REVIEW OF SYSTEMS:   CONSTITUTIONAL: No fever, has fatigue and weakness.  EYES: No blurred or double vision.  EARS, NOSE, AND THROAT: No tinnitus or ear pain.  RESPIRATORY: No cough, shortness of breath, wheezing or hemoptysis.  CARDIOVASCULAR: No chest pain, orthopnea, edema.  GASTROINTESTINAL: No nausea, vomiting, diarrhea or abdominal pain.  GENITOURINARY: No dysuria, hematuria.  ENDOCRINE: No polyuria, nocturia,  HEMATOLOGY: No anemia, easy bruising or bleeding SKIN: No rash or lesion. MUSCULOSKELETAL: No joint pain or arthritis.   NEUROLOGIC: No tingling, numbness, weakness.  Had seizure. PSYCHIATRY: No anxiety or depression.   MEDICATIONS AT HOME:  Prior to Admission medications   Medication Sig Start Date End Date Taking? Authorizing Provider  belladonna-opium (B&O SUPPRETTES) 16.2-30 MG suppository Place 30 mg rectally every 8 (eight) hours as needed for pain.   Yes [provider]  butalbital-acetaminophen-caffeine (FIORICET, ESGIC) 50-325-40 MG tablet Take by mouth. 09/21/16  Yes [provider]  Cyanocobalamin 1000 MCG CAPS Take by mouth.   Yes [provider]  DULoxetine (CYMBALTA) 30 MG capsule TAKE  WITH 60 MG CAPSULE TO EQUAL TOTAL DAILY DOSE OF 90 MG. 10/09/18  Yes Corie Chiquitoarter, Jessica, PMHNP  DULoxetine (CYMBALTA) 60 MG capsule TAKE 1 CAPSULE BY MOUTH EVERY DAY 09/27/18  Yes Corie Chiquitoarter, Jessica, PMHNP  folic acid (FOLVITE) 1 MG tablet Take 1 mg by mouth daily.   Yes [provider]  gabapentin (NEURONTIN) 300 MG capsule Take 300 mg by mouth 2 (two)  times daily.    Yes [provider]  LINZESS 72 MCG capsule TAKE 1 CAPSULE (72 MCG TOTAL) BY MOUTH ONCE DAILY 09/04/18  Yes [provider]  mirabegron ER (MYRBETRIQ) 50 MG TB24 tablet Take by mouth daily.    Yes [provider]  ondansetron (ZOFRAN) 4 MG tablet Take 4 mg by mouth every 8 (eight) hours as needed for nausea or vomiting.   Yes [provider]  OXYCODONE HCL PO Take 5 mg by mouth every 6 (six) hours as needed.   Yes [provider]  pantoprazole (PROTONIX) 40 MG tablet TAKE 1 TABLET (40 MG TOTAL) BY MOUTH ONCE DAILY TAKE 30 MINS BEFORE MEAL 08/26/18  Yes [provider]  promethazine (PHENERGAN) 12.5 MG tablet Take 1 tablet (12.5 mg total) by mouth every 4 (four) hours as needed for nausea or vomiting. 01/19/17  Yes Hildred LaserBudzyn, Brian James, MD  rizatriptan (MAXALT) 5 MG tablet Take 5 mg by mouth as needed.  09/21/16  Yes [provider]  Trospium Chloride 60 MG CP24 Take by mouth.   Yes [provider]  mirtazapine (REMERON) 30 MG tablet TAKE 1 TABLET (30 MG TOTAL) BY MOUTH AT BEDTIME Patient taking differently: 15 mg.  09/01/18   Corie Chiquitoarter, Jessica, PMHNP  trimethoprim (TRIMPEX) 100 MG tablet Take by mouth. 06/19/18 12/16/18  [provider]      PHYSICAL EXAMINATION:   VITAL SIGNS: Blood pressure 108/82, pulse 80, temperature (!) 97.5 F (36.4 C), temperature source Tympanic, resp. rate 11, height 5\' 7"  (1.702 m), weight 65.8 kg, last menstrual period 10/26/2018, SpO2 99 %.  GENERAL:  38 y.o.-year-old patient lying in the bed with no acute distress.  EYES: Pupils equal, round, reactive to light and accommodation. No scleral icterus. Extraocular muscles intact.  HEENT: Head atraumatic, normocephalic. Oropharynx and nasopharynx clear.  NECK:  Supple, no jugular venous distention. No thyroid enlargement, no tenderness.  LUNGS: Normal breath sounds bilaterally, no wheezing, rales,rhonchi or crepitation. No use of  accessory muscles of respiration.  CARDIOVASCULAR: S1, S2 normal. No murmurs, rubs, or gallops.  ABDOMEN: Soft, nontender, nondistended. Bowel sounds present. No organomegaly or mass.  Has urostomy abg EXTREMITIES: No pedal edema, cyanosis, or clubbing.  NEUROLOGIC: Cranial nerves II through XII are intact. Muscle strength 5/5 in all extremities. Sensation intact. Gait not checked.  PSYCHIATRIC: The patient is alert and oriented x 2.  SKIN: No obvious rash, lesion, or ulcer.   LABORATORY PANEL:   CBC No results for input(s): WBC, HGB, HCT, PLT, MCV, MCH, MCHC, RDW, LYMPHSABS, MONOABS, EOSABS, BASOSABS, BANDABS in the last 168 hours.  Invalid input(s): NEUTRABS, BANDSABD ------------------------------------------------------------------------------------------------------------------  Chemistries  No results for input(s): NA, K, CL, CO2, GLUCOSE, BUN, CREATININE, CALCIUM, MG, AST, ALT, ALKPHOS, BILITOT in the last 168 hours.  Invalid input(s): GFRCGP ------------------------------------------------------------------------------------------------------------------ CrCl cannot be calculated (Patient's most recent lab result is older than the maximum 21 days allowed.). ------------------------------------------------------------------------------------------------------------------ No results for input(s): TSH, T4TOTAL, T3FREE, THYROIDAB in the last 72 hours.  Invalid input(s): FREET3   Coagulation profile No results for input(s): INR, PROTIME in the  last 168 hours. ------------------------------------------------------------------------------------------------------------------- No results for input(s): DDIMER in the last 72 hours. -------------------------------------------------------------------------------------------------------------------  Cardiac Enzymes No results for input(s): CKMB, TROPONINI, MYOGLOBIN in the last 168 hours.  Invalid input(s):  CK ------------------------------------------------------------------------------------------------------------------ Invalid input(s): POCBNP  ---------------------------------------------------------------------------------------------------------------  Urinalysis    Component Value Date/Time   COLORURINE AMBER (A) 03/03/2017 2216   APPEARANCEUR Clear 03/05/2017 1031   LABSPEC 1.029 03/03/2017 2216   PHURINE 5.0 03/03/2017 2216   GLUCOSEU Negative 03/05/2017 1031   HGBUR MODERATE (A) 03/03/2017 2216   BILIRUBINUR Negative 03/05/2017 1031   KETONESUR 20 (A) 03/03/2017 2216   PROTEINUR 2+ (A) 03/05/2017 1031   PROTEINUR 30 (A) 03/03/2017 2216   NITRITE Negative 03/05/2017 1031   NITRITE NEGATIVE 03/03/2017 2216   LEUKOCYTESUR Negative 03/05/2017 1031     RADIOLOGY: No results found.  EKG: No orders found for this or any previous visit.  IMPRESSION AND PLAN: 38 year old female with a known history of neurogenic bladder with urostomy bag, and kidney stone the past, vision abnormalities migraine had endoscopy and colonoscopy done by gastroenterology today patient had procedures done for chronic constipation rectal bleed and dyspepsia.  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   All the records are reviewed and case discussed with ED provider. Management plans discussed with the patient, family and they are in agreement.  CODE STATUS: Full code Code Status History    Date Active Date Inactive Code Status Order ID Comments User Context   04/16/2016 1542 04/19/2016 2130 Full Code [id number]  Malen GauzeMcKenzie, Patrick L, MD ED   Advance Care Planning  Activity     TOTAL TIME TAKING CARE OF THIS PATIENT: 53 minutes.    Ihor AustinPavan Pyreddy M.D on 11/11/2018 at 4:51 PM  Between 7am to 6pm - Pager - [phone]  After 6pm go to [email or web address] - password [email or web address] [facility]  [facility]  Office  [phone]  CC: Primary care physician; Lazarus GowdaGoeres, Lindsey W, MD

## 2018-11-11 NOTE — Op Note (Signed)
[facility] Gastroenterology Patient Name: Kyriaki Moder Procedure Date: 11/11/2018 11:22 AM MRN: [id number] Account #: [id number] Date of Birth: [birth year] Admit Type: Outpatient Age: 38 Room: [facility] ENDO ROOM 1 Gender: Female Note Status: Finalized Procedure:            Upper GI endoscopy Indications:          Dyspepsia Providers:            [name], MD Medicines:            Monitored Anesthesia Care Complications:        No immediate complications. Procedure:            Pre-Anesthesia Assessment:                       - ASA Grade Assessment: III - A patient with severe                        systemic disease.                       After obtaining informed consent, the endoscope was                        passed under direct vision. Throughout the procedure,                        the patient's blood pressure, pulse, and oxygen                        saturations were monitored continuously. The Endoscope                        was introduced through the mouth, and advanced to the                        third part of duodenum. The patient tolerated the                        procedure well. Findings:      A widely patent and non-obstructing Schatzki ring was found at the       gastroesophageal junction.      The Z-line was variable. Biopsies were taken with a cold forceps for       histology.      A small hiatal hernia was found. The Z-line was a variable distance from       incisors; the hiatal hernia was sliding.      Patchy mildly erythematous mucosa without bleeding was found in the       gastric antrum. The gastric body was normal otherwise. Biopsies were       taken with a cold forceps for histology of the antrum and body. Biopsies       were taken with a cold forceps for Helicobacter pylori testing of the       antrum and body.      The cardia and gastric fundus were normal on retroflexion 0therwise.      Patchy mild mucosal variance  characterized by altered texture was found       in the entire duodenum. Biopsies were taken with a cold forceps for       histology. Impression:           -  Widely patent and non-obstructing Schatzki ring.                       - Z-line variable. Biopsied.                       - Small hiatal hernia.                       - Erythematous mucosa in the antrum. Biopsied.                       - Mucosal variant in the duodenum. Biopsied. Recommendation:       - Continue present medications.                       - Await pathology results. Procedure Code(s):    --- Professional ---                       [phone], Esophagogastroduodenoscopy, flexible, transoral;                        with biopsy, single or multiple Diagnosis Code(s):    --- Professional ---                       K22.2, Esophageal obstruction                       K22.8, Other specified diseases of esophagus                       K44.9, Diaphragmatic hernia without obstruction or                        gangrene                       K31.89, Other diseases of stomach and duodenum                       R10.13, Epigastric pain CPT copyright 2019 American Medical Association. All rights reserved. The codes documented in this report are preliminary and upon coder review may  be revised to meet current compliance requirements. Christena DeemMartin U Vianey Caniglia, MD 11/11/2018 1:25:24 PM This report has been signed electronically. Number of Addenda: 0 Note Initiated On: 11/11/2018 11:22 AM      [facility]

## 2018-11-11 NOTE — Transfer of Care (Signed)
Immediate Anesthesia Transfer of Care Note  Patient: Christine Chavez  Procedure(s) Performed: ESOPHAGOGASTRODUODENOSCOPY (EGD) WITH PROPOFOL (N/A ) COLONOSCOPY WITH PROPOFOL (N/A )  Patient Location: PACU  Anesthesia Type:General  Level of Consciousness: sedated  Airway & Oxygen Therapy: Patient Spontanous Breathing and Patient connected to nasal cannula oxygen  Post-op Assessment: Report given to RN and Post -op Vital signs reviewed and stable  Post vital signs: Reviewed and stable  Last Vitals:  Vitals Value Taken Time  BP 111/76 11/11/18 1358  Temp 36.4 C 11/11/18 1358  Pulse 98 11/11/18 1400  Resp 20 11/11/18 1400  SpO2 100 % 11/11/18 1400  Vitals shown include unvalidated device data.  Last Pain:  Vitals:   11/11/18 1358  TempSrc: Tympanic  PainSc: Asleep         Complications: No apparent anesthesia complications

## 2018-11-11 NOTE — OR Nursing (Signed)
Pt seen by Dr Chalmers Cater to admit for observation.  Waiting on bed placement

## 2018-11-11 NOTE — Op Note (Signed)
[facility] Gastroenterology Patient Name: Shakena Callari Procedure Date: 11/11/2018 11:21 AM MRN: [id number] Account #: [id number] Date of Birth: 1981-03-19 Admit Type: Outpatient Age: 38 Room: [facility] ENDO ROOM 1 Gender: Female Note Status: Finalized Procedure:            Colonoscopy Indications:          Rectal bleeding, Change in bowel habits, Constipation Providers:            [name], MD Medicines:            Monitored Anesthesia Care Complications:        No immediate complications. Procedure:            Pre-Anesthesia Assessment:                       - ASA Grade Assessment: III - A patient with severe                        systemic disease.                       After obtaining informed consent, the colonoscope was                        passed under direct vision. Throughout the procedure,                        the patient's blood pressure, pulse, and oxygen                        saturations were monitored continuously. The                        Colonoscope was introduced through the anus and                        advanced to the the cecum, identified by appendiceal                        orifice and ileocecal valve. The colonoscopy was                        performed without difficulty. The patient tolerated the                        procedure well. The quality of the bowel preparation                        was good. Findings:      A diffuse area of mild melanosis was found in the proximal transverse       colon, in the mid transverse colon, in the ascending colon and in the       cecum.      Biopsies for histology were taken with a cold forceps from the right       colon and left colon for evaluation of microscopic colitis.      The sigmoid colon, descending colon and transverse colon were moderately       tortuous.      The digital rectal exam findings include Poor rectal tone with mild       gaping of the anal orifice.  The  retroflexed view of the distal rectum and anal verge was normal and       showed no anal or rectal abnormalities. Impression:           - Melanosis in the colon.                       - Tortuous colon.                       - Poor rectal tone with mild gaping of the anal                        orifice. found on digital rectal exam.                       - The distal rectum and anal verge are normal on                        retroflexion view.                       - Biopsies were taken with a cold forceps from the                        right colon and left colon for evaluation of                        microscopic colitis. Recommendation:       - Await pathology results.                       - Continue present medications.                       - consider adding one dose of miralax daily.                       - Perform colonic transit study and anorectal manometry.                       - Return to GI clinic in 2 weeks. Procedure Code(s):    --- Professional ---                       [id number], Colonoscopy, flexible; with biopsy, single or                        multiple Diagnosis Code(s):    --- Professional ---                       K63.89, Other specified diseases of intestine                       K62.5, Hemorrhage of anus and rectum                       R19.4, Change in bowel habit                       K59.00, Constipation, unspecified                       Q43.8,  Other specified congenital malformations of                        intestine CPT copyright 2019 American Medical Association. All rights reserved. The codes documented in this report are preliminary and upon coder review may  be revised to meet current compliance requirements. Christena DeemMartin U , MD 11/11/2018 2:13:25 PM This report has been signed electronically. Number of Addenda: 0 Note Initiated On: 11/11/2018 11:21 AM Scope Withdrawal Time: 0 hours 9 minutes 35 seconds  Total Procedure Duration: 0 hours 25 minutes  43 seconds       [facility]

## 2018-11-11 NOTE — H&P (Signed)
Outpatient short stay form Pre-procedure 11/11/2018 12:36 PM Christine DeemMartin U Kharma Sampsel MD  Primary Physician: Truitt MerleLindsay Goeres MD  Reason for visit: EGD and colonoscopy  History of present illness: Patient is a 38 year old female presenting today for an EGD and colonoscopy in regards to increasing issues with chronic constipation, rectal bleeding and dyspepsia.  She has the past year had increasing problems of constipation followed by some rectal bleeding after the bowel movement.  She has currently taking Linzess as well as a "vegetable laxative".  He is also taking Protonix once a day.  She has a history of a neurogenic bladder and a ileal conduit ureterostomy.  He is currently being evaluated for possible MS.  Other symptoms include abdominal bloating.  Her ureterostomy was done in September 2019.    Current Facility-Administered Medications:  .  0.9 %  sodium chloride infusion, , Intravenous, Continuous, Christine DeemSkulskie, Dhilan Brauer U, MD, Last Rate: 20 mL/hr at 11/11/18 1212 .  0.9 %  sodium chloride infusion, , Intravenous, Continuous, Christine DeemSkulskie, Lavon Horn U, MD  Medications Prior to Admission  Medication Sig Dispense Refill Last Dose  . belladonna-opium (B&O SUPPRETTES) 16.2-30 MG suppository Place 30 mg rectally every 8 (eight) hours as needed for pain.   Past Month at Unknown time  . butalbital-acetaminophen-caffeine (FIORICET, ESGIC) 50-325-40 MG tablet Take by mouth.   Past Month at Unknown time  . Cyanocobalamin 1000 MCG CAPS Take by mouth.   Past Week at Unknown time  . DULoxetine (CYMBALTA) 30 MG capsule TAKE WITH 60 MG CAPSULE TO EQUAL TOTAL DAILY DOSE OF 90 MG. 90 capsule 0 11/10/2018 at 2000  . DULoxetine (CYMBALTA) 60 MG capsule TAKE 1 CAPSULE BY MOUTH EVERY DAY 90 capsule 0 11/10/2018 at 2000  . folic acid (FOLVITE) 1 MG tablet Take 1 mg by mouth daily.   Past Week at Unknown time  . gabapentin (NEURONTIN) 300 MG capsule Take 300 mg by mouth 2 (two) times daily.    Past Month at Unknown time  .  LINZESS 72 MCG capsule TAKE 1 CAPSULE (72 MCG TOTAL) BY MOUTH ONCE DAILY   Past Week at Unknown time  . mirabegron ER (MYRBETRIQ) 50 MG TB24 tablet Take by mouth daily.    11/10/2018 at 2000  . ondansetron (ZOFRAN) 4 MG tablet Take 4 mg by mouth every 8 (eight) hours as needed for nausea or vomiting.   Past Week at Unknown time  . OXYCODONE HCL PO Take 5 mg by mouth every 6 (six) hours as needed.   Past Week at Unknown time  . pantoprazole (PROTONIX) 40 MG tablet TAKE 1 TABLET (40 MG TOTAL) BY MOUTH ONCE DAILY TAKE 30 MINS BEFORE MEAL   Past Week at Unknown time  . promethazine (PHENERGAN) 12.5 MG tablet Take 1 tablet (12.5 mg total) by mouth every 4 (four) hours as needed for nausea or vomiting. 20 tablet 0 Past Week at Unknown time  . rizatriptan (MAXALT) 5 MG tablet Take 5 mg by mouth as needed.    Past Week at Unknown time  . Trospium Chloride 60 MG CP24 Take by mouth.   11/10/2018 at 0800  . mirtazapine (REMERON) 30 MG tablet TAKE 1 TABLET (30 MG TOTAL) BY MOUTH AT BEDTIME (Patient taking differently: 15 mg. ) 90 tablet 0   . trimethoprim (TRIMPEX) 100 MG tablet Take by mouth.   Not Taking at Unknown time     Allergies  Allergen Reactions  . Aspirin Shortness Of Breath, Swelling and Anaphylaxis  . Ibuprofen Shortness Of  Breath, Swelling and Anaphylaxis  . Morphine And Related Hives     Past Medical History:  Diagnosis Date  . Headache   . Migraines   . Neurogenic bladder   . Renal disorder   . Vision abnormalities     Review of systems:      Physical Exam    Heart and lungs: The rate and rhythm without rub or gallop lungs are bilaterally clear    HEENT: Normocephalic atraumatic eyes are anicteric    Other:    Pertinant exam for procedure: Soft nontender nondistended bowel sounds positive normoactive there is a ureterostomy in the right lower quadrant.  Small amount of clear urine in the bag.    Planned proceedures: EGD, colonoscopy and indicated procedures. I have  discussed the risks benefits and complications of procedures to include not limited to bleeding, infection, perforation and the risk of sedation and the patient wishes to proceed.    [name], MD Gastroenterology 11/11/2018  12:36 PM

## 2018-11-11 NOTE — Anesthesia Post-op Follow-up Note (Signed)
Anesthesia QCDR form completed.        

## 2018-11-11 NOTE — OR Nursing (Signed)
Suspected emergence delirium post-op.  VS remain stable but pt had frequent bouts of nausea.  C Struick CRNA administered phenergan iv.  Discussed situation with husband   Stated this was normal for her postop referring to past anesthesia

## 2018-11-11 NOTE — Progress Notes (Signed)
Advanced care plan. Purpose of the Encounter: CODE STATUS Parties in Attendance:Patient & family Patient's Decision Capacity:Ok Subjective/Patient's story: Christine Chavez  is a 38 y.o. female with a known history of neurogenic bladder with urostomy bag, and kidney stone the past, vision abnormalities migraine had endoscopy and colonoscopy done by gastroenterology today patient had procedures done for chronic constipation rectal bleed and dyspepsia.  Patient had increasing problems with constipation in the past.  She is currently on vegetable laxative and also taking proton pump regular.  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  Currently being evaluated for multiple sclerosis.  Objective/Medical story Patient seen in endoscopy unit.  Needs further evaluation for seizure neurology consult.  Needs IV fluids for dehydration. Goals of care determination:  Advance care directives goals of care treatment plan discussed Patient and family wants everything done which includes CPR, intubation ventilator of the need arises CODE STATUS: Full code Time spent discussing advanced care planning: 16 minutes

## 2018-11-11 NOTE — Anesthesia Preprocedure Evaluation (Signed)
Anesthesia Evaluation  Patient identified by MRN, date of birth, ID band Patient awake    Reviewed: Allergy & Precautions, H&P , NPO status , Patient's Chart, lab work & pertinent test results  History of Anesthesia Complications (+) PONV, PROLONGED EMERGENCE and history of anesthetic complications  Airway Mallampati: II  TM Distance: >3 FB Neck ROM: full    Dental  (+) Chipped   Pulmonary neg pulmonary ROS, neg shortness of breath,           Cardiovascular Exercise Tolerance: Good (-) angina(-) Past MI and (-) DOE negative cardio ROS       Neuro/Psych  Headaches, PSYCHIATRIC DISORDERS    GI/Hepatic negative GI ROS, Neg liver ROS, neg GERD  ,  Endo/Other  negative endocrine ROS  Renal/GU Renal disease  negative genitourinary   Musculoskeletal   Abdominal   Peds  Hematology negative hematology ROS (+)   Anesthesia Other Findings Past Medical History: No date: Headache No date: Migraines No date: Neurogenic bladder No date: Renal disorder No date: Vision abnormalities  Past Surgical History: 1997: ANTERIOR CRUCIATE LIGAMENT REPAIR No date: APPENDECTOMY 04/17/2016: CYSTOSCOPY WITH STENT PLACEMENT; Right     Comment:  Procedure: CYSTOSCOPY WITH STENT PLACEMENT;  Surgeon:               [name], MD;  Location: [facility] ORS;  Service:               Urology;  Laterality: Right; 1999: EXPLORATORY LAPAROTOMY 04/2016: KIDNEY STONE SURGERY No date: REVISION UROSTOMY CUTANEOUS No date: TONSILLECTOMY  BMI    Body Mass Index: 22.71 kg/m      Reproductive/Obstetrics negative OB ROS                             Anesthesia Physical Anesthesia Plan  ASA: II  Anesthesia Plan: General   Post-op Pain Management:    Induction: Intravenous  PONV Risk Score and Plan: Propofol infusion, TIVA and Ondansetron  Airway Management Planned: Natural Airway and Nasal Cannula  Additional  Equipment:   Intra-op Plan:   Post-operative Plan:   Informed Consent: I have reviewed the patients History and Physical, chart, labs and discussed the procedure including the risks, benefits and alternatives for the proposed anesthesia with the patient or authorized representative who has indicated his/her understanding and acceptance.     Dental Advisory Given  Plan Discussed with: Anesthesiologist, CRNA and Surgeon  Anesthesia Plan Comments: (Patient consented for risks of anesthesia including but not limited to:  - adverse reactions to medications - risk of intubation if required - damage to teeth, lips or other oral mucosa - sore throat or hoarseness - Damage to heart, brain, lungs or loss of life  Patient voiced understanding.)        Anesthesia Quick Evaluation

## 2018-11-12 ENCOUNTER — Encounter: Payer: Self-pay | Admitting: Anesthesiology

## 2018-11-12 DIAGNOSIS — R569 Unspecified convulsions: Secondary | ICD-10-CM

## 2018-11-12 DIAGNOSIS — R1013 Epigastric pain: Secondary | ICD-10-CM | POA: Diagnosis not present

## 2018-11-12 LAB — CBC
HCT: 33.8 % — ABNORMAL LOW (ref 36.0–46.0)
Hemoglobin: 11.2 g/dL — ABNORMAL LOW (ref 12.0–15.0)
MCH: 31.2 pg (ref 26.0–34.0)
MCHC: 33.1 g/dL (ref 30.0–36.0)
MCV: 94.2 fL (ref 80.0–100.0)
Platelets: 252 10*3/uL (ref 150–400)
RBC: 3.59 MIL/uL — ABNORMAL LOW (ref 3.87–5.11)
RDW: 13.4 % (ref 11.5–15.5)
WBC: 7.2 10*3/uL (ref 4.0–10.5)
nRBC: 0 % (ref 0.0–0.2)

## 2018-11-12 LAB — BASIC METABOLIC PANEL
Anion gap: 5 (ref 5–15)
BUN: <5 mg/dL — ABNORMAL LOW (ref 6–20)
CO2: 24 mmol/L (ref 22–32)
Calcium: 8 mg/dL — ABNORMAL LOW (ref 8.9–10.3)
Chloride: 110 mmol/L (ref 98–111)
Creatinine, Ser: 0.65 mg/dL (ref 0.44–1.00)
GFR calc Af Amer: >60 mL/min (ref >60)
GFR calc non Af Amer: >60 mL/min (ref >60)
Glucose, Bld: 91 mg/dL (ref 70–99)
Potassium: 3.4 mmol/L — ABNORMAL LOW (ref 3.5–5.1)
Sodium: 139 mmol/L (ref 135–145)

## 2018-11-12 MED ORDER — CYANOCOBALAMIN 1000 MCG/ML IJ SOLN
1000.0000 ug | Freq: Once | INTRAMUSCULAR | Status: AC
  Administered 2018-11-12: 14:00:00 1000 ug via INTRAMUSCULAR
  Filled 2018-11-12: qty 1

## 2018-11-12 MED ORDER — GABAPENTIN 300 MG PO CAPS: 300.0000 mg | ORAL_CAPSULE | Freq: Two times a day (BID) | ORAL | Status: DC

## 2018-11-12 MED ORDER — OXYBUTYNIN CHLORIDE 5 MG PO TABS
5.0000 mg | ORAL_TABLET | Freq: Three times a day (TID) | ORAL | Status: DC | PRN
  Administered 2018-11-12: 06:00:00 5 mg via ORAL
  Filled 2018-11-12 (×2): qty 1

## 2018-11-12 NOTE — Plan of Care (Addendum)
Pt d/ced home. She was admitted for "seizure like activity" post EGD and colonoscopy yesterday, that wasn't a seizure.  No activity since admission.  She is having bladder spasms and she said that's indicative of infection.  She will be following up with her PCP tomorrow and I gave her a specimen cup to get a sample when she does an in/out cath. Neurology came to see her and pointed out that she has ongoing difficult response to anesthesia and maybe that needs to be taken into consideration next time.  Dr. Earnest Conroy said he will review her chart.  Removed IV.  No new meds prescribed.  Did give her B12 injection.  Her mother is transporting her home.

## 2018-11-12 NOTE — Anesthesia Postprocedure Evaluation (Signed)
Anesthesia Post Note  Patient: Christine Chavez  Procedure(s) Performed: ESOPHAGOGASTRODUODENOSCOPY (EGD) WITH PROPOFOL (N/A ) COLONOSCOPY WITH PROPOFOL (N/A )  Patient location during evaluation: Nursing Unit Anesthesia Type: General Level of consciousness: awake and alert Pain management: pain level controlled Vital Signs Assessment: post-procedure vital signs reviewed and stable Respiratory status: spontaneous breathing, nonlabored ventilation and respiratory function stable Cardiovascular status: blood pressure returned to baseline and stable Postop Assessment: no apparent nausea or vomiting Comments: Patient admitted for overnight observation after having shaking movements in the Endo recovery unit.  Medicine and neurology both consulted for treatment and observation of the patient. I spoke with the patient this morning and she is much improved from yesterday. Differential still includes post op delirium, atypical emergence, prolonged emergence, pseudo seizures and seizures.      Last Vitals:  Vitals:   11/11/18 1958 11/12/18 0510  BP: 107/77 108/78  Pulse: 80 72  Resp: 16 16  Temp: 36.6 C 36.8 C  SpO2: 100% 97%    Last Pain:  Vitals:   11/12/18 0754  TempSrc:   PainSc: 6                  Precious Haws Piscitello

## 2018-11-12 NOTE — Discharge Summary (Signed)
[facility] - [place] at [facility]   PATIENT NAME: Christine Chavez    MR#:  [id number]  DATE OF BIRTH:  1980-12-27  DATE OF ADMISSION:  11/11/2018 ADMITTING PHYSICIAN: Christena DeemMartin U Skulskie, MD  DATE OF DISCHARGE: 11/12/2018  PRIMARY CARE PHYSICIAN: Lazarus GowdaGoeres, Lindsey W, MD    ADMISSION DIAGNOSIS:  DYSPEPSIA RECTAL BLEED  DISCHARGE DIAGNOSIS:  Active Problems:   Seizure (HCC)   SECONDARY DIAGNOSIS:   Past Medical History:  Diagnosis Date  . Complication of anesthesia    ? seizures after anesthesia   . Headache   . Migraines   . Neurogenic bladder   . Renal disorder   . Vision abnormalities     HOSPITAL COURSE:   38 year old female with past medical history of neurogenic bladder, migraines, suspected MS, history of urostomy and nephrolithiasis who was admitted to the hospital due to suspected seizure type activity after her endoscopy/colonoscopy.  1.  Suspected seizures-patient was admitted to the hospital under observation as post endoscopy colonoscopy she was thought to have a possible seizure. - Patient was observed overnight and has had no further seizure activity.  Neurology consult was obtained and they think this is likely a result of patient receiving anesthesia prior to her procedure as she has had reaction to that before.  No acute seizures are noted no need for EEG and patient does not need to be antiepileptics now.  Patient is stable and therefore is going to be discharged home.  2.  History of neurogenic bladder-patient is status post urostomy. - Continue Myrbetriq, trimethoprim. - Await urinalysis and if positive will discharge patient on oral antibiotics.  3.  Chronic pain-continue oxycodone.  4.  History of depression-continue Cymbalta, Remeron.  5.  History of migraines-continue Maxalt.  6.  Neuropathy-continue gabapentin.  DISCHARGE CONDITIONS:   Stable  CONSULTS OBTAINED:  Treatment Team:  Pauletta BrownsZeylikman, Yuriy, MD  DRUG ALLERGIES:    Allergies  Allergen Reactions  . Aspirin Shortness Of Breath, Swelling and Anaphylaxis  . Ibuprofen Shortness Of Breath, Swelling and Anaphylaxis  . Morphine And Related Hives    DISCHARGE MEDICATIONS:   Allergies as of 11/12/2018      Reactions   Aspirin Shortness Of Breath, Swelling, Anaphylaxis   Ibuprofen Shortness Of Breath, Swelling, Anaphylaxis   Morphine And Related Hives      Medication List    TAKE these medications   belladonna-opium 16.2-30 MG suppository Commonly known as: B&O SUPPRETTES Place 30 mg rectally every 8 (eight) hours as needed for pain.   butalbital-acetaminophen-caffeine 50-325-40 MG tablet Commonly known as: FIORICET Take by mouth.   Cyanocobalamin 1000 MCG Caps Take by mouth.   DULoxetine 60 MG capsule Commonly known as: CYMBALTA TAKE 1 CAPSULE BY MOUTH EVERY DAY   DULoxetine 30 MG capsule Commonly known as: CYMBALTA TAKE WITH 60 MG CAPSULE TO EQUAL TOTAL DAILY DOSE OF 90 MG.   folic acid 1 MG tablet Commonly known as: FOLVITE Take 1 mg by mouth daily.   gabapentin 300 MG capsule Commonly known as: NEURONTIN Take 1 capsule (300 mg total) by mouth 2 (two) times daily.   Linzess 72 MCG capsule Generic drug: linaclotide TAKE 1 CAPSULE (72 MCG TOTAL) BY MOUTH ONCE DAILY   mirabegron ER 50 MG Tb24 tablet Commonly known as: MYRBETRIQ Take 50 mg by mouth daily.   mirtazapine 30 MG tablet Commonly known as: REMERON TAKE 1 TABLET (30 MG TOTAL) BY MOUTH AT BEDTIME What changed: See the new instructions.   ondansetron 4 MG  tablet Commonly known as: ZOFRAN Take 4 mg by mouth every 8 (eight) hours as needed for nausea or vomiting.   OXYCODONE HCL PO Take 5 mg by mouth every 6 (six) hours as needed.   pantoprazole 40 MG tablet Commonly known as: PROTONIX Take 40 mg by mouth 2 (two) times daily.   promethazine 12.5 MG tablet Commonly known as: PHENERGAN Take 1 tablet (12.5 mg total) by mouth every 4 (four) hours as needed for  nausea or vomiting.   rizatriptan 5 MG tablet Commonly known as: MAXALT Take 5 mg by mouth as needed.   trimethoprim 100 MG tablet Commonly known as: TRIMPEX Take by mouth.   Trospium Chloride 60 MG Cp24 Take by mouth.         DISCHARGE INSTRUCTIONS:   DIET:  Regular diet  DISCHARGE CONDITION:  Stable  ACTIVITY:  Activity as tolerated  OXYGEN:  Home Oxygen: No.   Oxygen Delivery: room air  DISCHARGE LOCATION:  home   If you experience worsening of your admission symptoms, develop shortness of breath, life threatening emergency, suicidal or homicidal thoughts you must seek medical attention immediately by calling 911 or calling your MD immediately  if symptoms less severe.  You Must read complete instructions/literature along with all the possible adverse reactions/side effects for all the Medicines you take and that have been prescribed to you. Take any new Medicines after you have completely understood and accpet all the possible adverse reactions/side effects.   Please note  You were cared for by a hospitalist during your hospital stay. If you have any questions about your discharge medications or the care you received while you were in the hospital after you are discharged, you can call the unit and asked to speak with the hospitalist on call if the hospitalist that took care of you is not available. Once you are discharged, your primary care physician will handle any further medical issues. Please note that NO REFILLS for any discharge medications will be authorized once you are discharged, as it is imperative that you return to your primary care physician (or establish a relationship with a primary care physician if you do not have one) for your aftercare needs so that they can reassess your need for medications and monitor your lab values.     Today   No acute events overnight. No seizures.  Will d/c home today and pt. In agreement.   VITAL SIGNS:  Blood  pressure 108/78, pulse 72, temperature 98.2 F (36.8 C), temperature source Oral, resp. rate 16, height 5\' 7"  (1.702 m), weight 65.8 kg, last menstrual period 10/26/2018, SpO2 97 %.  I/O:    Intake/Output Summary (Last 24 hours) at 11/12/2018 1411 Last data filed at 11/12/2018 0900 Gross per 24 hour  Intake 877.13 ml  Output 1250 ml  Net -372.87 ml    PHYSICAL EXAMINATION:  GENERAL:  38 y.o.-year-old patient lying in the bed with no acute distress.  EYES: Pupils equal, round, reactive to light and accommodation. No scleral icterus. Extraocular muscles intact.  HEENT: Head atraumatic, normocephalic. Oropharynx and nasopharynx clear.  NECK:  Supple, no jugular venous distention. No thyroid enlargement, no tenderness.  LUNGS: Normal breath sounds bilaterally, no wheezing, rales,rhonchi. No use of accessory muscles of respiration.  CARDIOVASCULAR: S1, S2 normal. No murmurs, rubs, or gallops.  ABDOMEN: Soft, non-tender, non-distended. Bowel sounds present. No organomegaly or mass. + Urostomy in place.   EXTREMITIES: No pedal edema, cyanosis, or clubbing.  NEUROLOGIC: Cranial nerves II through  XII are intact. No focal motor or sensory defecits b/l.   PSYCHIATRIC: The patient is alert and oriented x 3.  SKIN: No obvious rash, lesion, or ulcer.   DATA REVIEW:   CBC Recent Labs  Lab 11/12/18 0520  WBC 7.2  HGB 11.2*  HCT 33.8*  PLT 252    Chemistries  Recent Labs  Lab 11/12/18 0520  NA 139  K 3.4*  CL 110  CO2 24  GLUCOSE 91  BUN <5*  CREATININE 0.65  CALCIUM 8.0*    Cardiac Enzymes No results for input(s): TROPONINI in the last 168 hours.  Microbiology Results  Results for orders placed or performed during the hospital encounter of 11/11/18  SARS Coronavirus 2 (CEPHEID - [facility]formed [facility]), Hosp Order     Status: None   Collection Time: 11/11/18  5:50 PM   Specimen: Nasopharyngeal Swab  Result Value Ref Range Status   SARS Coronavirus 2 NEGATIVE  NEGATIVE Final    Comment: (NOTE) If result is NEGATIVE SARS-CoV-2 target nucleic acids are NOT DETECTED. The SARS-CoV-2 RNA is generally detectable in upper and lower  respiratory specimens during the acute phase of infection. The lowest  concentration of SARS-CoV-2 viral copies this assay can detect is 250  copies / mL. A negative result does not preclude SARS-CoV-2 infection  and should not be used as the sole basis for treatment or other  patient management decisions.  A negative result may occur with  improper specimen collection / handling, submission of specimen other  than nasopharyngeal swab, presence of viral mutation(s) within the  areas targeted by this assay, and inadequate number of viral copies  (<250 copies / mL). A negative result must be combined with clinical  observations, patient history, and epidemiological information. If result is POSITIVE SARS-CoV-2 target nucleic acids are DETECTED. The SARS-CoV-2 RNA is generally detectable in upper and lower  respiratory specimens dur ing the acute phase of infection.  Positive  results are indicative of active infection with SARS-CoV-2.  Clinical  correlation with patient history and other diagnostic information is  necessary to determine patient infection status.  Positive results do  not rule out bacterial infection or co-infection with other viruses. If result is PRESUMPTIVE POSTIVE SARS-CoV-2 nucleic acids MAY BE PRESENT.   A presumptive positive result was obtained on the submitted specimen  and confirmed on repeat testing.  While 2019 novel coronavirus  (SARS-CoV-2) nucleic acids may be present in the submitted sample  additional confirmatory testing may be necessary for epidemiological  and / or clinical management purposes  to differentiate between  SARS-CoV-2 and other Sarbecovirus currently known to infect humans.  If clinically indicated additional testing with an alternate test  methodology [id number]) is  advised. The SARS-CoV-2 RNA is generally  detectable in upper and lower respiratory sp ecimens during the acute  phase of infection. The expected result is Negative. Fact Sheet for Patients:  BoilerBrush.com.cyhttps://www.fda.gov/media/136312/download Fact Sheet for Healthcare Providers: https://pope.com/https://www.fda.gov/media/136313/download This test is not yet approved or cleared by the [place] FDA and has been authorized for detection and/or diagnosis of SARS-CoV-2 by FDA under an Emergency Use Authorization (EUA).  This EUA will remain in effect (meaning this test can be used) for the duration of the COVID-19 declaration under Section 564(b)(1) of the Act, 21 U.S.C. section 360bbb-3(b)(1), unless the authorization is terminated or revoked sooner. Performed at [facility], [address] [place], [place] [address]     RADIOLOGY:  No results found.  Management plans discussed with the patient, family and they are in agreement.  CODE STATUS:     Code Status Orders  (From admission, onward)         Start     Ordered   11/11/18 1800  Full code  Continuous     11/11/18 1759        TOTAL TIME TAKING CARE OF THIS PATIENT: 40 minutes.    Houston SirenVivek J Weber Monnier M.D on 11/12/2018 at 2:11 PM  Between 7am to 6pm - Pager - [phone]  After 6pm go to [email or web address] - Social research officer, governmentpassword [email or web address] [email or web address]  [employer]  [facility]ists  Office  [phone]  CC: Primary care physician; Lazarus GowdaGoeres, Lindsey W, MD

## 2018-11-12 NOTE — Consult Note (Signed)
Reason for Consult:confusion/agitation  Referring Physician: Dr. Verdell Carmine   CC: confusion/agitation/tremor.   HPI: Christine Chavez is an 38 y.o. female with a known history of neurogenic bladder with urostomy bag, and kidney stone the past, vision abnormalities migraine had endoscopy and colonoscopy done by gastroenterology yesterday afternoon due to chronic constipation rectal bleed and dyspepsia.   Postprocedure she was thought to have had a seizure vs confusion. Pt has periods of tremor but could talk during those episodes.  Now much improved and close to baseline   Past Medical History:  Diagnosis Date  . Headache   . Migraines   . Neurogenic bladder   . Renal disorder   . Vision abnormalities     Past Surgical History:  Procedure Laterality Date  . ANTERIOR CRUCIATE LIGAMENT REPAIR  1997  . APPENDECTOMY    . COLONOSCOPY WITH PROPOFOL N/A 11/11/2018   Procedure: COLONOSCOPY WITH PROPOFOL;  Surgeon: [name], MD;  Location: [facility] ENDOSCOPY;  Service: Endoscopy;  Laterality: N/A;  . CYSTOSCOPY WITH STENT PLACEMENT Right 04/17/2016   Procedure: CYSTOSCOPY WITH STENT PLACEMENT;  Surgeon: [name], MD;  Location: [facility] ORS;  Service: Urology;  Laterality: Right;  . ESOPHAGOGASTRODUODENOSCOPY (EGD) WITH PROPOFOL N/A 11/11/2018   Procedure: ESOPHAGOGASTRODUODENOSCOPY (EGD) WITH PROPOFOL;  Surgeon: [name], MD;  Location: [facility] ENDOSCOPY;  Service: Endoscopy;  Laterality: N/A;  . EXPLORATORY LAPAROTOMY  1999  . KIDNEY STONE SURGERY  04/2016  . REVISION UROSTOMY CUTANEOUS    . TONSILLECTOMY      Family History  Problem Relation Age of Onset  . Hypertension Mother   . Atrial fibrillation Father   . Healthy Brother   . Depression Brother   . Arthritis/Rheumatoid Paternal Grandmother   . Healthy Brother     Social History:  reports that she has never smoked. She has never used smokeless tobacco. She reports that she does not drink alcohol or use  drugs.  Allergies  Allergen Reactions  . Aspirin Shortness Of Breath, Swelling and Anaphylaxis  . Ibuprofen Shortness Of Breath, Swelling and Anaphylaxis  . Morphine And Related Hives    Medications: I have reviewed the patient's current medications.  ROS: History obtained from the patient  General ROS: negative for - chills, fatigue, fever, night sweats, weight gain or weight loss Psychological ROS: no difficulties, mood swings or suicidal ideation Ophthalmic ROS: negative for - blurry vision, double vision, eye pain or loss of vision ENT ROS: negative for - epistaxis, nasal discharge, oral lesions, sore throat, tinnitus or vertigo Allergy and Immunology ROS: negative for - hives or itchy/watery eyes Hematological and Lymphatic ROS: negative for - bleeding problems, bruising or swollen lymph nodes Endocrine ROS: negative for - galactorrhea, hair pattern changes, polydipsia/polyuria or temperature intolerance Respiratory ROS: negative for - cough, hemoptysis, shortness of breath or wheezing Cardiovascular ROS: negative for - chest pain, dyspnea on exertion, edema or irregular heartbeat Gastrointestinal constipation   Genito-Urinary ROS: positive for  hematuria, incontinence or urinary frequency/urgency Musculoskeletal ROS: negative for - joint swelling or muscular weakness Neurological ROS: as noted in HPI Dermatological ROS: negative for rash and skin lesion changes  Physical Examination: Blood pressure 108/78, pulse 72, temperature 98.2 F (36.8 C), temperature source Oral, resp. rate 16, height 5\' 7"  (1.702 m), weight 65.8 kg, last menstrual period 10/26/2018, SpO2 97 %.   Neurological Examination   Mental Status: Alert, oriented, thought content appropriate.  Speech fluent without evidence of aphasia.  Able to follow 3 step commands without  difficulty. Cranial Nerves: II: Discs flat bilaterally; Visual fields grossly normal, pupils equal, round, reactive to light and  accommodation III,IV, VI: ptosis not present, extra-ocular motions intact bilaterally V,VII: smile symmetric, facial light touch sensation normal bilaterally VIII: hearing normal bilaterally IX,X: gag reflex present XI: bilateral shoulder shrug XII: midline tongue extension Motor: Right : Upper extremity   5/5    Left:     Upper extremity   5/5  Lower extremity   5/5     Lower extremity   5/5 Tone and bulk:normal tone throughout; no atrophy noted Sensory: Pinprick and light touch intact throughout, bilaterally Deep Tendon Reflexes: 1+ and symmetric throughout Plantars: Right: downgoing   Left: downgoing Cerebellar: Not tested       Laboratory Studies:   Basic Metabolic Panel: Recent Labs  Lab 11/11/18 1921 11/12/18 0520  NA  --  139  K  --  3.4*  CL  --  110  CO2  --  24  GLUCOSE  --  91  BUN  --  <5*  CREATININE 0.66 0.65  CALCIUM  --  8.0*    Liver Function Tests: No results for input(s): AST, ALT, ALKPHOS, BILITOT, PROT, ALBUMIN in the last 168 hours. No results for input(s): LIPASE, AMYLASE in the last 168 hours. No results for input(s): AMMONIA in the last 168 hours.  CBC: Recent Labs  Lab 11/11/18 1921 11/12/18 0520  WBC 7.4 7.2  HGB 11.2* 11.2*  HCT 34.0* 33.8*  MCV 93.7 94.2  PLT 255 252    Cardiac Enzymes: No results for input(s): CKTOTAL, CKMB, CKMBINDEX, TROPONINI in the last 168 hours.  BNP: Invalid input(s): POCBNP  CBG: No results for input(s): GLUCAP in the last 168 hours.  Microbiology: Results for orders placed or performed during the hospital encounter of 11/11/18  SARS Coronavirus 2 (CEPHEID - Performed in [facility] hospital lab), Hosp Order     Status: None   Collection Time: 11/11/18  5:50 PM   Specimen: Nasopharyngeal Swab  Result Value Ref Range Status   SARS Coronavirus 2 NEGATIVE NEGATIVE Final    Comment: (NOTE) If result is NEGATIVE SARS-CoV-2 target nucleic acids are NOT DETECTED. The SARS-CoV-2 RNA is generally  detectable in upper and lower  respiratory specimens during the acute phase of infection. The lowest  concentration of SARS-CoV-2 viral copies this assay can detect is 250  copies / mL. A negative result does not preclude SARS-CoV-2 infection  and should not be used as the sole basis for treatment or other  patient management decisions.  A negative result may occur with  improper specimen collection / handling, submission of specimen other  than nasopharyngeal swab, presence of viral mutation(s) within the  areas targeted by this assay, and inadequate number of viral copies  (<250 copies / mL). A negative result must be combined with clinical  observations, patient history, and epidemiological information. If result is POSITIVE SARS-CoV-2 target nucleic acids are DETECTED. The SARS-CoV-2 RNA is generally detectable in upper and lower  respiratory specimens dur ing the acute phase of infection.  Positive  results are indicative of active infection with SARS-CoV-2.  Clinical  correlation with patient history and other diagnostic information is  necessary to determine patient infection status.  Positive results do  not rule out bacterial infection or co-infection with other viruses. If result is PRESUMPTIVE POSTIVE SARS-CoV-2 nucleic acids MAY BE PRESENT.   A presumptive positive result was obtained on the submitted specimen  and confirmed on repeat  testing.  While 2019 novel coronavirus  (SARS-CoV-2) nucleic acids may be present in the submitted sample  additional confirmatory testing may be necessary for epidemiological  and / or clinical management purposes  to differentiate between  SARS-CoV-2 and other Sarbecovirus currently known to infect humans.  If clinically indicated additional testing with an alternate test  methodology [phone] is advised. The SARS-CoV-2 RNA is generally  detectable in upper and lower respiratory sp ecimens during the acute  phase of infection. The  expected result is Negative. Fact Sheet for Patients:  BoilerBrush.com.cyhttps://www.fda.gov/media/136312/download Fact Sheet for Healthcare Providers: https://pope.com/https://www.fda.gov/media/136313/download This test is not yet approved or cleared by the [place] FDA and has been authorized for detection and/or diagnosis of SARS-CoV-2 by FDA under an Emergency Use Authorization (EUA).  This EUA will remain in effect (meaning this test can be used) for the duration of the COVID-19 declaration under Section 564(b)(1) of the Act, 21 U.S.C. section 360bbb-3(b)(1), unless the authorization is terminated or revoked sooner. Performed at [facility], [address] [place], [place] [address]     Coagulation Studies: No results for input(s): LABPROT, INR in the last 72 hours.  Urinalysis: No results for input(s): COLORURINE, LABSPEC, PHURINE, GLUCOSEU, HGBUR, BILIRUBINUR, KETONESUR, PROTEINUR, UROBILINOGEN, NITRITE, LEUKOCYTESUR in the last 168 hours.  Invalid input(s): APPERANCEUR  Lipid Panel:  No results found for: CHOL, TRIG, HDL, CHOLHDL, VLDL, LDLCALC  HgbA1C:  Lab Results  Component Value Date   HGBA1C 5.0 04/18/2016    Urine Drug Screen:  No results found for: LABOPIA, COCAINSCRNUR, LABBENZ, AMPHETMU, THCU, LABBARB  Alcohol Level: No results for input(s): ETH in the last 168 hours.  Other results: EKG: normal EKG, normal sinus rhythm, unchanged from previous tracings.  Imaging: No results found.   Assessment/Plan:   38 y.o. female with a known history of neurogenic bladder with urostomy bag, and kidney stone the past, vision abnormalities migraine had endoscopy and colonoscopy done by gastroenterology yesterday afternoon due to chronic constipation rectal bleed and dyspepsia.   Postprocedure she was thought to have had a seizure vs confusion. Pt has periods of tremor but could talk during those episodes.  Now much improved and close to baseline  - Pt states she had similar episode in  the past post anesthesia so possibly very sensitive to sedation  - not convinced this is true post ictal or seizure event from discussion with anesthesiology team yesterday afternoon and reading the notes.  - From neurological perspective I don't think she needs any further imaging or anti epileptic medications - No IVF now, and I think she can be d/c today.   11/12/2018, 10:11 AM

## 2018-11-13 ENCOUNTER — Telehealth: Payer: Self-pay | Admitting: Psychiatry

## 2018-11-13 DIAGNOSIS — F332 Major depressive disorder, recurrent severe without psychotic features: Secondary | ICD-10-CM

## 2018-11-13 LAB — SURGICAL PATHOLOGY

## 2018-11-13 LAB — HIV ANTIBODY (ROUTINE TESTING W REFLEX): HIV Screen 4th Generation wRfx: NONREACTIVE

## 2018-11-13 MED ORDER — ARIPIPRAZOLE 2 MG PO TABS: 2.0000 mg | ORAL_TABLET | Freq: Every day | ORAL | 0 refills | Status: DC

## 2018-11-13 NOTE — Telephone Encounter (Addendum)
Pt left v-mail stating she's in hospital now. Needs med change. This was left yesterday afternoon. Received this AM. Call back ASAP. [phone] Has appt 8/3

## 2018-11-13 NOTE — Telephone Encounter (Signed)
Pt verbalized understanding and to call back with any questions or concerns.   Pt asked if Telehealth was still an option for her Appt on Monday, 11/18/2018 since pt in wheelchair. Recommend pt call front office staff tomorrow to discuss her options. She agreed

## 2018-11-13 NOTE — Telephone Encounter (Signed)
Left voicemail to call back with information, apologized in just receiving her voicemail this morning. Instructed to call back after 1 pm

## 2018-11-18 ENCOUNTER — Other Ambulatory Visit: Payer: Self-pay

## 2018-11-18 ENCOUNTER — Ambulatory Visit (INDEPENDENT_AMBULATORY_CARE_PROVIDER_SITE_OTHER): Payer: BLUE CROSS/BLUE SHIELD | Admitting: Psychiatry

## 2018-11-18 ENCOUNTER — Encounter: Payer: Self-pay | Admitting: Psychiatry

## 2018-11-18 DIAGNOSIS — F419 Anxiety disorder, unspecified: Secondary | ICD-10-CM | POA: Diagnosis not present

## 2018-11-18 MED ORDER — LORAZEPAM 0.5 MG PO TABS: ORAL_TABLET | ORAL | 1 refills | Status: DC

## 2018-11-18 NOTE — Progress Notes (Signed)
Christine Chavez 05-Jan-1981 38 y.o.  Virtual Visit via Telephone Note  I connected with pt on 11/18/18 at  2:30 PM EDT by telephone and verified that I am speaking with the correct person using two identifiers.   I discussed the limitations, risks, security and privacy concerns of performing an evaluation and management service by telephone and the availability of in person appointments. I also discussed with the patient that there may be a patient responsible charge related to this service. The patient expressed understanding and agreed to proceed.   I discussed the assessment and treatment plan with the patient. The patient was provided an opportunity to ask questions and all were answered. The patient agreed with the plan and demonstrated an understanding of the instructions.   The patient was advised to call back or seek an in-person evaluation if the symptoms worsen or if the condition fails to improve as anticipated.  I provided 30 minutes of non-face-to-face time during this encounter.  The patient was located at home.  The provider [facility].   Christine Chavez, PMHNP   Subjective:   Patient ID:  Christine Chavez is a 38 y.o. (DOB 05-Jan-1981) female.  Chief Complaint:  Chief Complaint  Patient presents with  . Depression  . Anxiety    HPI Christine Chavez presents for follow-up of depression and anxiety. She reports that she has had a colonoscopy and endoscopy for worsening GI s/s. Reports that she was also hospitalized at [facility] for UTI that was not improving with po antibiotics. Reports that physical s/s have had a negative impact on her mood and have caused her increased stress. Reports that she has had uncontrolled crying. She reports that she had a recent experience with possible panic s/s where she was waiting for hours in a crowded waiting room with a face mask. Reports that she also had some acute anxiety in anticipation of  colonoscopy and endoscopy. Reports that she has also had acute anxiety with an MRI. Reports that she is having some worry and occasional catastrophic thinking. Reports that she has had some physical s/s with worry. Reports that depression has been worse. She reports that she has had some irritability that manifests more as frustration with herself and not being able to do certain things. Reports that she has been feeling isolated due to pandemic and also feeling as if friends have withdrawn due to not knowing what to say or how to encourage her. Reports some feelings of hopelessness. Reports that she feels that she is a burden to family. Denies SI.   Pt called office on 11/13/18 to report worsening mood and asking if she could change medication prior to apt. Pt decreased dose of Cymbalta to 60 mg po qd as directed after calling office on 11/13/18. She also started Abilify at that time. She reports that she has had some nausea the last few days and is not sure if it is a side effect or due to other GI issues. She reports that her energy and motivation have been low and is not sure if this is due to health issues or taking Abilify and Cymbalta in the morning. She reports that she has had insomnia on a few occasions due to anxious thoughts. Reports that she usually falls asleep without difficulty and that she notices difficulty when she wakes up and cannot return to sleep. Appetite has been decreased and this may be due to GI issues. She reports that her concentration  and focus has not been as good over the last few weeks.   Reports that she does not take Oxycodone prn frequently.  Past medication Trials: Remeron- Reports sleeping better at 15 mg but feels more lethargic upon awakening compared to 7.5 mg.  Sertraline- Has been on 200 mg for the last month and has not noticed a significant improvement. Cymbalta- Reports increased drowsiness and impaired concentration at 90 mg.  Rexulti-Increased heart rate and  SOB Abilify Gabapentin- Causes drowsiness. Anxiety may have increased when she stopped taking TID. Reports that is did not seem to be effective for pain  Review of Systems:  Review of Systems  Gastrointestinal: Positive for abdominal distention and constipation.  Genitourinary:       Has urostomy. Recent UTI  Musculoskeletal: Positive for gait problem.  Neurological: Negative for tremors.  Psychiatric/Behavioral:       Please refer to HPI    Medications: I have reviewed the patient's current medications.  Current Outpatient Medications  Medication Sig Dispense Refill  . ARIPiprazole (ABILIFY) 2 MG tablet Take 1 tablet (2 mg total) by mouth daily. 30 tablet 0  . butalbital-acetaminophen-caffeine (FIORICET, ESGIC) 50-325-40 MG tablet Take by mouth.    . cyanocobalamin (,VITAMIN B-12,) 1000 MCG/ML injection Inject 1,000 mcg into the muscle once.    . Cyanocobalamin 1000 MCG CAPS Take by mouth.    . DULoxetine (CYMBALTA) 60 MG capsule TAKE 1 CAPSULE BY MOUTH EVERY DAY 90 capsule 0  . folic acid (FOLVITE) 1 MG tablet Take 1 mg by mouth daily.    Marland Kitchen LINZESS 72 MCG capsule TAKE 1 CAPSULE (72 MCG TOTAL) BY MOUTH ONCE DAILY    . mirabegron ER (MYRBETRIQ) 50 MG TB24 tablet Take 50 mg by mouth daily.     . mirtazapine (REMERON) 30 MG tablet TAKE 1 TABLET (30 MG TOTAL) BY MOUTH AT BEDTIME (Patient taking differently: 15 mg. ) 90 tablet 0  . ondansetron (ZOFRAN) 4 MG tablet Take 4 mg by mouth every 8 (eight) hours as needed for nausea or vomiting.    . OXYCODONE HCL PO Take 5 mg by mouth every 6 (six) hours as needed.    . pantoprazole (PROTONIX) 40 MG tablet Take 40 mg by mouth 2 (two) times daily.     . promethazine (PHENERGAN) 12.5 MG tablet Take 1 tablet (12.5 mg total) by mouth every 4 (four) hours as needed for nausea or vomiting. 20 tablet 0  . rizatriptan (MAXALT) 5 MG tablet Take 5 mg by mouth as needed.     . trimethoprim (TRIMPEX) 100 MG tablet Take by mouth.    . Trospium Chloride 60  MG CP24 Take by mouth.    . belladonna-opium (B&O SUPPRETTES) 16.2-30 MG suppository Place 30 mg rectally every 8 (eight) hours as needed for pain.    Marland Kitchen gabapentin (NEURONTIN) 300 MG capsule Take 1 capsule (300 mg total) by mouth 2 (two) times daily. (Patient not taking: Reported on 11/18/2018)    . LORazepam (ATIVAN) 0.5 MG tablet Take 1/2-1 tab po qd prn severe anxiety 15 tablet 1   No current facility-administered medications for this visit.     Medication Side Effects: Other: Possible nausea  Allergies:  Allergies  Allergen Reactions  . Aspirin Shortness Of Breath, Swelling and Anaphylaxis  . Ibuprofen Shortness Of Breath, Swelling and Anaphylaxis  . Morphine And Related Hives    Past Medical History:  Diagnosis Date  . Complication of anesthesia    ? seizures after anesthesia   .  Headache   . Migraines   . Neurogenic bladder   . Renal disorder   . Vision abnormalities     Family History  Problem Relation Age of Onset  . Hypertension Mother   . Atrial fibrillation Father   . Healthy Brother   . Depression Brother   . Arthritis/Rheumatoid Paternal Grandmother   . Healthy Brother     Social History   Socioeconomic History  . Marital status: Married    Spouse name: Not on file  . Number of children: Not on file  . Years of education: Not on file  . Highest education level: Not on file  Occupational History  . Not on file  Social Needs  . Financial resource strain: Not on file  . Food insecurity    Worry: Not on file    Inability: Not on file  . Transportation needs    Medical: Not on file    Non-medical: Not on file  Tobacco Use  . Smoking status: Never Smoker  . Smokeless tobacco: Never Used  Substance and Sexual Activity  . Alcohol use: No    Alcohol/week: 0.0 standard drinks  . Drug use: No  . Sexual activity: Yes    Comment: vasectomy  Lifestyle  . Physical activity    Days per week: Not on file    Minutes per session: Not on file  . Stress:  Not on file  Relationships  . Social Musicianconnections    Talks on phone: Not on file    Gets together: Not on file    Attends religious service: Not on file    Active member of club or organization: Not on file    Attends meetings of clubs or organizations: Not on file    Relationship status: Not on file  . Intimate partner violence    Fear of current or ex partner: Not on file    Emotionally abused: Not on file    Physically abused: Not on file    Forced sexual activity: Not on file  Other Topics Concern  . Not on file  Social History Narrative  . Not on file    Past Medical History, Surgical history, Social history, and Family history were reviewed and updated as appropriate.   Please see review of systems for further details on the patient's review from today.   Objective:   Physical Exam:  LMP 10/26/2018 (Exact Date) Comment: 11/11/18 neg preg test   Physical Exam Neurological:     Mental Status: She is alert and oriented to person, place, and time.     Cranial Nerves: No dysarthria.  Psychiatric:        Attention and Perception: Attention normal.        Mood and Affect: Mood is anxious and depressed.        Speech: Speech normal.        Behavior: Behavior is cooperative.        Thought Content: Thought content normal. Thought content is not paranoid or delusional. Thought content does not include homicidal or suicidal ideation. Thought content does not include homicidal or suicidal plan.        Cognition and Memory: Cognition and memory normal.        Judgment: Judgment normal.     Lab Review:     Component Value Date/Time   NA 139 11/12/2018 0520   K 3.4 (L) 11/12/2018 0520   CL 110 11/12/2018 0520   CO2 24 11/12/2018 0520   GLUCOSE  91 11/12/2018 0520   BUN <5 (L) 11/12/2018 0520   CREATININE 0.65 11/12/2018 0520   CALCIUM 8.0 (L) 11/12/2018 0520   PROT 7.2 03/03/2017 2132   ALBUMIN 4.3 03/03/2017 2132   AST 17 03/03/2017 2132   ALT 15 03/03/2017 2132    ALKPHOS 51 03/03/2017 2132   BILITOT 1.7 (H) 03/03/2017 2132   GFRNONAA >60 11/12/2018 0520   GFRAA >60 11/12/2018 0520       Component Value Date/Time   WBC 7.2 11/12/2018 0520   RBC 3.59 (L) 11/12/2018 0520   HGB 11.2 (L) 11/12/2018 0520   HCT 33.8 (L) 11/12/2018 0520   HCT 31.7 (L) 04/28/2012 0618   PLT 252 11/12/2018 0520   MCV 94.2 11/12/2018 0520   MCH 31.2 11/12/2018 0520   MCHC 33.1 11/12/2018 0520   RDW 13.4 11/12/2018 0520   LYMPHSABS 3.3 03/03/2017 2132   MONOABS 0.6 03/03/2017 2132   EOSABS 0.1 03/03/2017 2132   BASOSABS 0.1 03/03/2017 2132    No results found for: POCLITH, LITHIUM   No results found for: PHENYTOIN, PHENOBARB, VALPROATE, CBMZ   .res Assessment: Plan:   Discussed more time is needed to determine response to Abilify.  Recommend continuing Abilify 2 mg daily since patient appears to be tolerating Abilify without difficulty.  Will also continue Cymbalta 60 mg daily at this time until response to Abilify is determined.  Recommended changing administration time of Abilify and Cymbalta to bedtime since patient reports that these medications seem to be causing her some possible fatigue and somnolence.  Discussed potential benefits, risks, and side effects of Lorazepam. Discussed potential benefits, risk, and side effects of benzodiazepines to include potential risk of tolerance and dependence, as well as possible drowsiness.  Advised patient not to drive if experiencing drowsiness and to take lowest possible effective dose to minimize risk of dependence and tolerance.  Patient advised not to take lorazepam as needed within several hours of pain medication due to risk of CNS depression.  Patient verbalizes understanding.  Will start Ativan 0.5 mg 1/2-1 tab p.o. daily as needed anxiety.  Patient to follow-up in 2 to 3 weeks or sooner if clinically indicated.  Patient advised to contact office with any questions, adverse effects, or acute worsening in signs and  symptoms.   Shanley was seen today for depression and anxiety.  Diagnoses and all orders for this visit:  Anxiety disorder, unspecified type -     LORazepam (ATIVAN) 0.5 MG tablet; Take 1/2-1 tab po qd prn severe anxiety    Please see After Visit Summary for patient specific instructions.  No future appointments.  No orders of the defined types were placed in this encounter.     -------------------------------

## 2018-11-25 ENCOUNTER — Other Ambulatory Visit: Payer: Self-pay | Admitting: Psychiatry

## 2018-11-25 DIAGNOSIS — F5101 Primary insomnia: Secondary | ICD-10-CM

## 2018-11-25 DIAGNOSIS — F332 Major depressive disorder, recurrent severe without psychotic features: Secondary | ICD-10-CM

## 2018-12-05 ENCOUNTER — Other Ambulatory Visit: Payer: Self-pay | Admitting: Psychiatry

## 2018-12-05 DIAGNOSIS — F332 Major depressive disorder, recurrent severe without psychotic features: Secondary | ICD-10-CM

## 2018-12-05 NOTE — Telephone Encounter (Signed)
Has appt tomorrow 08/21

## 2018-12-06 ENCOUNTER — Other Ambulatory Visit: Payer: Self-pay

## 2018-12-06 ENCOUNTER — Ambulatory Visit (INDEPENDENT_AMBULATORY_CARE_PROVIDER_SITE_OTHER): Payer: BC Managed Care – PPO | Admitting: Psychiatry

## 2018-12-06 ENCOUNTER — Encounter: Payer: Self-pay | Admitting: Psychiatry

## 2018-12-06 DIAGNOSIS — F419 Anxiety disorder, unspecified: Secondary | ICD-10-CM | POA: Diagnosis not present

## 2018-12-06 DIAGNOSIS — F5101 Primary insomnia: Secondary | ICD-10-CM | POA: Diagnosis not present

## 2018-12-06 DIAGNOSIS — F332 Major depressive disorder, recurrent severe without psychotic features: Secondary | ICD-10-CM | POA: Diagnosis not present

## 2018-12-06 MED ORDER — ARIPIPRAZOLE 5 MG PO TABS: 5.0000 mg | ORAL_TABLET | Freq: Every day | ORAL | 0 refills | Status: DC

## 2018-12-06 MED ORDER — VORTIOXETINE HBR 10 MG PO TABS: ORAL_TABLET | ORAL | 1 refills | Status: DC

## 2018-12-06 NOTE — Progress Notes (Signed)
Christine Chavez [id number] 10-26-1980 38 y.o.  Virtual Visit via Telephone Note  I connected with pt on 12/06/18 at 10:30 AM EDT by telephone and verified that I am speaking with the correct person using two identifiers.   I discussed the limitations, risks, security and privacy concerns of performing an evaluation and management service by telephone and the availability of in person appointments. I also discussed with the patient that there may be a patient responsible charge related to this service. The patient expressed understanding and agreed to proceed.   I discussed the assessment and treatment plan with the patient. The patient was provided an opportunity to ask questions and all were answered. The patient agreed with the plan and demonstrated an understanding of the instructions.   The patient was advised to call back or seek an in-person evaluation if the symptoms worsen or if the condition fails to improve as anticipated.  Husband now has full-time job.  I provided 30 minutes of non-face-to-face time during this encounter.  The patient was located at home.  The provider was located at [place].   Thayer Headings, PMHNP   Subjective:   Patient ID:  Christine Chavez is a 38 y.o. (DOB September 20, 1980) female.  Chief Complaint:  Chief Complaint  Patient presents with  . Depression  . Anxiety    HPI [place] presents for follow-up of anxiety and depression. Her friend Sharyn Lull also participates in call. Friend reports that pt's goal seems to be trying to get off medications. Friend reports that pt has never commented that any medication made s/s better or worse.   Pt reports that she went on vacation last week and "had some rough times." She reports that she has noticed more depression than anxiety. Reports periods of tearfulness. Reports mood is sad most of the time. She reports some irritability- "more with myself, than anybody else." Denies any recent  panic attacks and has not taken Ativan prn. Notices more worry, particularly about children returning to school. She reports that physical s/s continue to negatively impact mood and energy. She reports that she has noticed less drowsiness with taking medications at bedtime. She reports that she has the desire and motivation to do things but is physically limited. She reports difficulty "accepting the changes that are happening to my body." Denies anhedonia and is able to enjoy time with family and friends. She reports improved sleep with some increased awakenings when having more physical s/s. She reports that her appetite has been "so, so" in relation to GI s/s. She has noticed concentration difficulties and has difficulty with making out lists. She reports frequent guilt. Friend reports that pt has feeling that she may not be alive for much longer. Denies SI.   She reports that children are returning to school this week.    Past medication Trials: Remeron- Reports sleeping better at 15 mg but feels more lethargic upon awakening compared to 7.5 mg.  Sertraline- Has been on 200 mg for the last month and has not noticed a significant improvement. Cymbalta- Reports increased drowsiness and impaired concentration at 90 mg. Rexulti-Increased heart rate and SOB Abilify Gabapentin- Causes drowsiness. Anxiety may have increased when she stopped taking TID. Reports that is did not seem to be effective for pain  Review of Systems:  Review of Systems  Gastrointestinal: Positive for abdominal distention, abdominal pain and nausea.  Genitourinary:       Suspects that she has a current UTI.   Musculoskeletal: Positive  for gait problem.  Psychiatric/Behavioral:       Please refer to HPI    Medications: I have reviewed the patient's current medications.  Current Outpatient Medications  Medication Sig Dispense Refill  . belladonna-opium (B&O SUPPRETTES) 16.2-30 MG suppository Place 30 mg rectally every  8 (eight) hours as needed for pain.    . butalbital-acetaminophen-caffeine (FIORICET, ESGIC) 50-325-40 MG tablet Take by mouth.    . cyanocobalamin (,VITAMIN B-12,) 1000 MCG/ML injection Inject 1,000 mcg into the muscle once.    . Cyanocobalamin 1000 MCG CAPS Take by mouth.    . DULoxetine (CYMBALTA) 60 MG capsule TAKE 1 CAPSULE BY MOUTH EVERY DAY 90 capsule 0  . folic acid (FOLVITE) 1 MG tablet Take 1 mg by mouth daily.    Marland Kitchen. LINZESS 72 MCG capsule TAKE 1 CAPSULE (72 MCG TOTAL) BY MOUTH ONCE DAILY    . mirabegron ER (MYRBETRIQ) 50 MG TB24 tablet Take 50 mg by mouth daily.     . ondansetron (ZOFRAN) 4 MG tablet Take 4 mg by mouth every 8 (eight) hours as needed for nausea or vomiting.    . OXYCODONE HCL PO Take 5 mg by mouth every 6 (six) hours as needed.    . pantoprazole (PROTONIX) 40 MG tablet Take 40 mg by mouth 2 (two) times daily.     . promethazine (PHENERGAN) 12.5 MG tablet Take 1 tablet (12.5 mg total) by mouth every 4 (four) hours as needed for nausea or vomiting. 20 tablet 0  . rizatriptan (MAXALT) 5 MG tablet Take 5 mg by mouth as needed.     . trimethoprim (TRIMPEX) 100 MG tablet Take by mouth.    . Trospium Chloride 60 MG CP24 Take by mouth.    . ARIPiprazole (ABILIFY) 5 MG tablet Take 1 tablet (5 mg total) by mouth daily. 30 tablet 0  . gabapentin (NEURONTIN) 300 MG capsule Take 1 capsule (300 mg total) by mouth 2 (two) times daily. (Patient not taking: Reported on 11/18/2018)    . LORazepam (ATIVAN) 0.5 MG tablet Take 1/2-1 tab po qd prn severe anxiety (Patient not taking: Reported on 12/06/2018) 15 tablet 1  . mirtazapine (REMERON) 30 MG tablet TAKE 1 TABLET (30 MG TOTAL) BY MOUTH AT BEDTIME (Patient taking differently: Take 15 mg by mouth at bedtime. ) 90 tablet 0  . vortioxetine HBr (TRINTELLIX) 10 MG TABS tablet Take 1/2 tablet po qd with a meal for one week, then increase to 1 tablet daily with a meal 30 tablet 1   No current facility-administered medications for this visit.      Medication Side Effects: Other: Reports less drowsiness with taking medications at night.   Allergies:  Allergies  Allergen Reactions  . Aspirin Shortness Of Breath, Swelling and Anaphylaxis  . Ibuprofen Shortness Of Breath, Swelling and Anaphylaxis  . Morphine And Related Hives    Past Medical History:  Diagnosis Date  . Complication of anesthesia    ? seizures after anesthesia   . Headache   . Migraines   . Neurogenic bladder   . Renal disorder   . Vision abnormalities     Family History  Problem Relation Age of Onset  . Hypertension Mother   . Atrial fibrillation Father   . Healthy Brother   . Depression Brother   . Arthritis/Rheumatoid Paternal Grandmother   . Healthy Brother     Social History   Socioeconomic History  . Marital status: Married    Spouse name: Not on file  .  Number of children: Not on file  . Years of education: Not on file  . Highest education level: Not on file  Occupational History  . Not on file  Social Needs  . Financial resource strain: Not on file  . Food insecurity    Worry: Not on file    Inability: Not on file  . Transportation needs    Medical: Not on file    Non-medical: Not on file  Tobacco Use  . Smoking status: Never Smoker  . Smokeless tobacco: Never Used  Substance and Sexual Activity  . Alcohol use: No    Alcohol/week: 0.0 standard drinks  . Drug use: No  . Sexual activity: Yes    Comment: vasectomy  Lifestyle  . Physical activity    Days per week: Not on file    Minutes per session: Not on file  . Stress: Not on file  Relationships  . Social Musicianconnections    Talks on phone: Not on file    Gets together: Not on file    Attends religious service: Not on file    Active member of club or organization: Not on file    Attends meetings of clubs or organizations: Not on file    Relationship status: Not on file  . Intimate partner violence    Fear of current or ex partner: Not on file    Emotionally abused:  Not on file    Physically abused: Not on file    Forced sexual activity: Not on file  Other Topics Concern  . Not on file  Social History Narrative  . Not on file    Past Medical History, Surgical history, Social history, and Family history were reviewed and updated as appropriate.   Please see review of systems for further details on the patient's review from today.   Objective:   Physical Exam:  There were no vitals taken for this visit.  Physical Exam Neurological:     Mental Status: She is alert and oriented to person, place, and time.     Cranial Nerves: No dysarthria.  Psychiatric:        Attention and Perception: Attention normal.        Mood and Affect: Mood is anxious and depressed.        Speech: Speech normal.        Behavior: Behavior is cooperative.        Thought Content: Thought content normal. Thought content is not paranoid or delusional. Thought content does not include homicidal or suicidal ideation. Thought content does not include homicidal or suicidal plan.        Cognition and Memory: Cognition and memory normal.        Judgment: Judgment normal.     Comments: Insight intact     Lab Review:     Component Value Date/Time   NA 139 11/12/2018 0520   K 3.4 (L) 11/12/2018 0520   CL 110 11/12/2018 0520   CO2 24 11/12/2018 0520   GLUCOSE 91 11/12/2018 0520   BUN <5 (L) 11/12/2018 0520   CREATININE 0.65 11/12/2018 0520   CALCIUM 8.0 (L) 11/12/2018 0520   PROT 7.2 03/03/2017 2132   ALBUMIN 4.3 03/03/2017 2132   AST 17 03/03/2017 2132   ALT 15 03/03/2017 2132   ALKPHOS 51 03/03/2017 2132   BILITOT 1.7 (H) 03/03/2017 2132   GFRNONAA >60 11/12/2018 0520   GFRAA >60 11/12/2018 0520       Component Value Date/Time  WBC 7.2 11/12/2018 0520   RBC 3.59 (L) 11/12/2018 0520   HGB 11.2 (L) 11/12/2018 0520   HCT 33.8 (L) 11/12/2018 0520   HCT 31.7 (L) 04/28/2012 0618   PLT 252 11/12/2018 0520   MCV 94.2 11/12/2018 0520   MCH 31.2 11/12/2018 0520    MCHC 33.1 11/12/2018 0520   RDW 13.4 11/12/2018 0520   LYMPHSABS 3.3 03/03/2017 2132   MONOABS 0.6 03/03/2017 2132   EOSABS 0.1 03/03/2017 2132   BASOSABS 0.1 03/03/2017 2132    No results found for: POCLITH, LITHIUM   No results found for: PHENYTOIN, PHENOBARB, VALPROATE, CBMZ   .res Assessment: Plan:   Pt seen for 30 minutes and greater than 50% of session spent counseling pt and her friend re: questions about tx and understanding disease state. Agreed that current medications have not adequately treated mood and anxiety s/s and also reiterated that therapy would also be an important component of her tx and that many therapists are offering virtual visits since transportation and getting assistance to appointments has been a barrier to tx in the past.  Case staffed with Dr. Jennelle Humanottle.  Will increase Abilify to 5 mg po qd. Will considering starting Lithium if there has not been any significant improvement in mood after 2 weeks. Discussed potential benefits, risks, and side effects of Lithium. Discussed potential benefits, risks, and side effects of Trintellix and pt agrees to trial of Trintellix.  Will start Trintellix 5 mg po qd x 1 week, then increase to 10 mg po qd with a meal. Discussed that samples can be picked up at the office if someone is able to pick up samples for her. Pt reports that she will contact office if someone is able to pick up samples. Will send a script to pharmacy. Will decrease Cymbalta 30 mg po x 1 week, then stop. Discussed continuing Cymbalta 60 mg qd until Trintellix is available. May consider Lithium, Viibryd, or TCA if depressive s/s do not improve in the future.  Pt to f/u in 2-3 weeks or sooner if clinically indicated.  Patient advised to contact office with any questions, adverse effects, or acute worsening in signs and symptoms.   Alazae was seen today for depression and anxiety.  Diagnoses and all orders for this visit:  Severe episode of recurrent  major depressive disorder, without psychotic features (HCC) -     vortioxetine HBr (TRINTELLIX) 10 MG TABS tablet; Take 1/2 tablet po qd with a meal for one week, then increase to 1 tablet daily with a meal -     ARIPiprazole (ABILIFY) 5 MG tablet; Take 1 tablet (5 mg total) by mouth daily.  Anxiety disorder, unspecified type -     vortioxetine HBr (TRINTELLIX) 10 MG TABS tablet; Take 1/2 tablet po qd with a meal for one week, then increase to 1 tablet daily with a meal  Primary insomnia    Please see After Visit Summary for patient specific instructions.  No future appointments.  No orders of the defined types were placed in this encounter.     -------------------------------

## 2018-12-25 ENCOUNTER — Other Ambulatory Visit: Payer: Self-pay

## 2018-12-25 ENCOUNTER — Ambulatory Visit (INDEPENDENT_AMBULATORY_CARE_PROVIDER_SITE_OTHER): Payer: BC Managed Care – PPO | Admitting: Obstetrics and Gynecology

## 2018-12-25 ENCOUNTER — Other Ambulatory Visit (HOSPITAL_COMMUNITY)
Admission: RE | Admit: 2018-12-25 | Discharge: 2018-12-25 | Disposition: A | Discharge status: Home / Self Care | Payer: BC Managed Care – PPO | Source: Ambulatory Visit | Attending: Obstetrics and Gynecology | Admitting: Obstetrics and Gynecology

## 2018-12-25 ENCOUNTER — Encounter: Payer: Self-pay | Admitting: Obstetrics and Gynecology

## 2018-12-25 VITALS — BP 116/83 | HR 88 | Wt 144.0 lb

## 2018-12-25 DIAGNOSIS — Z1339 Encounter for screening examination for other mental health and behavioral disorders: Secondary | ICD-10-CM

## 2018-12-25 DIAGNOSIS — Z01419 Encounter for gynecological examination (general) (routine) without abnormal findings: Secondary | ICD-10-CM

## 2018-12-25 DIAGNOSIS — Z124 Encounter for screening for malignant neoplasm of cervix: Secondary | ICD-10-CM

## 2018-12-25 DIAGNOSIS — Z1331 Encounter for screening for depression: Secondary | ICD-10-CM

## 2018-12-25 NOTE — Progress Notes (Signed)
Gynecology Annual Exam  PCP: Lazarus Gowda, MD  Chief Complaint  Patient presents with  . Gynecologic Exam    vaginal spasms   History of Present Illness:  [name] is a 38 y.o. [phone] who LMP was Patient's last menstrual period was 11/18/2018 (exact date)., presents today for her annual examination.  Her menses are irregular, she still has the dark color to her menses.  She has had some clotting the first couple of days.  She occasionally skips a month.    She is sexually active.  She is having pain with intercourse.  She has had some pain on and off over the years. It has gotten worse over the past few months.  She has a kidney infection.  She had a urostomy.  She notes pain at all levels of penetration, worse at deeper levels.  She has been able to make things more tolerable with position changes.  She states that she has a history of bladder spasms and this has been ongoing.   Last Pap: 3 years ago.  Results were: no abnormalities /neg HPV DNA negative Hx of STDs: none  Last mammogram:  none There is no FH of breast cancer. There is no FH of ovarian cancer. The patient does do self-breast exams.  Tobacco use: The patient denies current or previous tobacco use. Alcohol use: none Exercise: not active  The patient wears seatbelts: yes.      Past Medical History:  Diagnosis Date  . Complication of anesthesia    ? seizures after anesthesia   . Headache   . Migraines   . Neurogenic bladder   . Renal disorder   . Vision abnormalities     Past Surgical History:  Procedure Laterality Date  . ANTERIOR CRUCIATE LIGAMENT REPAIR  1997  . APPENDECTOMY    . COLONOSCOPY WITH PROPOFOL N/A 11/11/2018   Procedure: COLONOSCOPY WITH PROPOFOL;  Surgeon: Christena Deem, MD;  Location: [facility] ENDOSCOPY;  Service: Endoscopy;  Laterality: N/A;  . CYSTOSCOPY WITH STENT PLACEMENT Right 04/17/2016   Procedure: CYSTOSCOPY WITH STENT PLACEMENT;  Surgeon: Malen Gauze, MD;   Location: [facility] ORS;  Service: Urology;  Laterality: Right;  . ESOPHAGOGASTRODUODENOSCOPY (EGD) WITH PROPOFOL N/A 11/11/2018   Procedure: ESOPHAGOGASTRODUODENOSCOPY (EGD) WITH PROPOFOL;  Surgeon: Christena Deem, MD;  Location: [facility] ENDOSCOPY;  Service: Endoscopy;  Laterality: N/A;  . EXPLORATORY LAPAROTOMY  1999  . KIDNEY STONE SURGERY  04/2016  . REVISION UROSTOMY CUTANEOUS    . REVISION UROSTOMY CUTANEOUS  01/10/2018  . SUPRAPUBIC CATHETER PLACEMENT  08/2017  . TONSILLECTOMY      Prior to Admission medications   Medication Sig Start Date End Date Taking? Authorizing Provider  ARIPiprazole (ABILIFY) 5 MG tablet Take 1 tablet (5 mg total) by mouth daily. 12/06/18 01/05/19 Yes Corie Chiquito, PMHNP  belladonna-opium (B&O SUPPRETTES) 16.2-30 MG suppository Place 30 mg rectally every 8 (eight) hours as needed for pain.   Yes [provider]  butalbital-acetaminophen-caffeine (FIORICET, ESGIC) 50-325-40 MG tablet Take by mouth. 09/21/16  Yes [provider]  cyanocobalamin (,VITAMIN B-12,) 1000 MCG/ML injection Inject 1,000 mcg into the muscle once.   Yes [provider]  folic acid (FOLVITE) 1 MG tablet Take 1 mg by mouth daily.   Yes [provider]  LINZESS 72 MCG capsule TAKE 1 CAPSULE (72 MCG TOTAL) BY MOUTH ONCE DAILY 09/04/18  Yes [provider]  LORazepam (ATIVAN) 0.5 MG tablet Take 1/2-1 tab po qd prn severe anxiety  11/18/18  Yes Thayer Headings, PMHNP  mirabegron ER (MYRBETRIQ) 50 MG TB24 tablet Take 50 mg by mouth daily.    Yes [provider]  mirtazapine (REMERON) 30 MG tablet TAKE 1 TABLET (30 MG TOTAL) BY MOUTH AT BEDTIME Patient taking differently: Take 15 mg by mouth at bedtime.  11/25/18  Yes Thayer Headings, PMHNP  ondansetron (ZOFRAN) 4 MG tablet Take 4 mg by mouth every 8 (eight) hours as needed for nausea or vomiting.   Yes [provider]  OXYCODONE HCL PO Take 5 mg by mouth every 6 (six) hours as needed.   Yes  [provider]  pantoprazole (PROTONIX) 40 MG tablet Take 40 mg by mouth 2 (two) times daily.  08/26/18  Yes [provider]  promethazine (PHENERGAN) 12.5 MG tablet Take 1 tablet (12.5 mg total) by mouth every 4 (four) hours as needed for nausea or vomiting. 01/19/17  Yes Nickie Retort, MD  rizatriptan (MAXALT) 5 MG tablet Take 5 mg by mouth as needed.  09/21/16  Yes [provider]  Trospium Chloride 60 MG CP24 Take by mouth.   Yes [provider]  vortioxetine HBr (TRINTELLIX) 10 MG TABS tablet Take 1/2 tablet po qd with a meal for one week, then increase to 1 tablet daily with a meal 12/06/18  Yes Thayer Headings, PMHNP  DULoxetine (CYMBALTA) 60 MG capsule TAKE 1 CAPSULE BY MOUTH EVERY DAY Patient not taking: Reported on 12/25/2018 09/27/18   Thayer Headings, PMHNP  gabapentin (NEURONTIN) 300 MG capsule Take 1 capsule (300 mg total) by mouth 2 (two) times daily. Patient not taking: Reported on 11/18/2018 11/12/18   Henreitta Leber, MD    Allergies  Allergen Reactions  . Aspirin Shortness Of Breath, Swelling and Anaphylaxis  . Ibuprofen Shortness Of Breath, Swelling and Anaphylaxis  . Morphine And Related Hives    Gynecologic History: Patient's last menstrual period was 11/18/2018 (exact date).  Obstetric History: [id number]  Social History   Socioeconomic History  . Marital status: Married    Spouse name: Not on file  . Number of children: Not on file  . Years of education: Not on file  . Highest education level: Not on file  Occupational History  . Not on file  Social Needs  . Financial resource strain: Not on file  . Food insecurity    Worry: Not on file    Inability: Not on file  . Transportation needs    Medical: Not on file    Non-medical: Not on file  Tobacco Use  . Smoking status: Never Smoker  . Smokeless tobacco: Never Used  Substance and Sexual Activity  . Alcohol use: No    Alcohol/week: 0.0 standard drinks  . Drug use: No   . Sexual activity: Yes    Birth control/protection: None    Comment: vasectomy  Lifestyle  . Physical activity    Days per week: Not on file    Minutes per session: Not on file  . Stress: Not on file  Relationships  . Social Herbalist on phone: Not on file    Gets together: Not on file    Attends religious service: Not on file    Active member of club or organization: Not on file    Attends meetings of clubs or organizations: Not on file    Relationship status: Not on file  . Intimate partner violence    Fear of current or ex partner: Not on file  Emotionally abused: Not on file    Physically abused: Not on file    Forced sexual activity: Not on file  Other Topics Concern  . Not on file  Social History Narrative  . Not on file    Family History  Problem Relation Age of Onset  . Hypertension Mother   . Atrial fibrillation Father   . Healthy Brother   . Depression Brother   . Arthritis/Rheumatoid Paternal Grandmother   . Healthy Brother     Review of Systems  Constitutional: Positive for malaise/fatigue. Negative for chills, diaphoresis, fever and weight loss.  HENT: Negative.   Eyes: Negative.   Respiratory: Negative.   Cardiovascular: Negative.   Gastrointestinal: Positive for constipation, nausea and vomiting. Negative for abdominal pain, blood in stool, diarrhea, heartburn and melena.  Genitourinary: Negative.        Dyspareunia  Musculoskeletal: Negative.   Skin: Negative.   Neurological: Positive for tingling and headaches. Negative for dizziness, tremors, sensory change, speech change, focal weakness, seizures, loss of consciousness and weakness.  Psychiatric/Behavioral: Positive for depression. Negative for hallucinations, memory loss, substance abuse and suicidal ideas. The patient is not nervous/anxious and does not have insomnia.      Physical Exam BP 116/83   Pulse 88   Wt 144 lb (65.3 kg)   LMP 11/18/2018 (Exact Date)   BMI 22.55  kg/m    Physical Exam Constitutional:      General: She is not in acute distress.    Appearance: Normal appearance. She is well-developed.  Genitourinary:     Pelvic exam was performed with patient in the lithotomy position.     Vulva, urethra, bladder and uterus normal.     No inguinal adenopathy present in the right or left side.    No signs of injury in the vagina.     No vaginal discharge, erythema, tenderness, bleeding or prolapse.     No cervical motion tenderness, discharge, lesion or polyp.     Uterus is mobile.     Uterus is not enlarged or tender.     No uterine mass detected.    Uterus is anteverted.     No right or left adnexal mass present.     Right adnexa not tender or full.     Left adnexa not tender or full.  HENT:     Head: Normocephalic and atraumatic.  Eyes:     General: No scleral icterus.    Conjunctiva/sclera: Conjunctivae normal.  Neck:     Musculoskeletal: Normal range of motion and neck supple.     Thyroid: No thyromegaly.  Cardiovascular:     Rate and Rhythm: Normal rate and regular rhythm.     Heart sounds: No murmur. No friction rub. No gallop.   Pulmonary:     Effort: Pulmonary effort is normal. No respiratory distress.     Breath sounds: Normal breath sounds. No wheezing or rales.  Chest:     Breasts:        Right: No inverted nipple, mass, nipple discharge, skin change or tenderness.        Left: No inverted nipple, mass, nipple discharge, skin change or tenderness.  Abdominal:     General: Bowel sounds are normal. There is no distension.     Palpations: Abdomen is soft. There is no mass.     Tenderness: There is no abdominal tenderness. There is no guarding or rebound.     Comments: Urostomy bag noted on right lower quadrant. Tissue  appears pink and healthy. I minimal amount of clear yellow urine in urostomy bag.  Musculoskeletal: Normal range of motion.        General: No swelling or tenderness.  Lymphadenopathy:     Cervical: No  cervical adenopathy.     Lower Body: No right inguinal adenopathy. No left inguinal adenopathy.  Neurological:     General: No focal deficit present.     Mental Status: She is alert and oriented to person, place, and time.     Cranial Nerves: No cranial nerve deficit.  Skin:    General: Skin is warm and dry.     Findings: No erythema or rash.  Psychiatric:        Mood and Affect: Mood normal.        Behavior: Behavior normal.        Judgment: Judgment normal.    No e/o prolapse Female chaperone present for pelvic and breast  portions of the physical exam  Results: AUDIT Questionnaire (screen for alcoholism): 0 PHQ-9: 8   Assessment: 38 y.o. [id number] female here for routine annual gynecologic examination.  Plan: Problem List Items Addressed This Visit    None    Visit Diagnoses    Women's annual routine gynecological examination    -  Primary   Relevant Orders   Cytology - PAP   Screening for depression       Screening for alcoholism       Pap smear for cervical cancer screening       Relevant Orders   Cytology - PAP      Screening: -- Blood pressure screen normal -- Colonoscopy - not due -- Mammogram - not due -- Weight screening: normal -- Depression screening negative (PHQ-9) - treated elsewhere -- Nutrition: normal -- cholesterol screening: not due for screening -- osteoporosis screening: not due -- tobacco screening: not using -- alcohol screening: AUDIT questionnaire indicates low-risk usage. -- family history of breast cancer screening: done. not at high risk. -- no evidence of domestic violence or intimate partner violence. -- STD screening: gonorrhea/chlamydia NAAT not collected per patient request. -- pap smear collected per ASCCP guidelines  Thomasene MohairStephen Gianina Olinde, MD 12/25/2018 2:05 PM

## 2018-12-26 ENCOUNTER — Encounter: Payer: Self-pay | Admitting: Psychiatry

## 2018-12-26 ENCOUNTER — Other Ambulatory Visit: Payer: Self-pay

## 2018-12-26 ENCOUNTER — Ambulatory Visit (INDEPENDENT_AMBULATORY_CARE_PROVIDER_SITE_OTHER): Payer: BC Managed Care – PPO | Admitting: Psychiatry

## 2018-12-26 DIAGNOSIS — F332 Major depressive disorder, recurrent severe without psychotic features: Secondary | ICD-10-CM | POA: Diagnosis not present

## 2018-12-26 DIAGNOSIS — F5101 Primary insomnia: Secondary | ICD-10-CM

## 2018-12-26 DIAGNOSIS — F419 Anxiety disorder, unspecified: Secondary | ICD-10-CM

## 2018-12-26 MED ORDER — LITHIUM CARBONATE 150 MG PO CAPS: 150.0000 mg | ORAL_CAPSULE | Freq: Every day | ORAL | 1 refills | Status: DC

## 2018-12-26 NOTE — Progress Notes (Signed)
Christine Chavez [id number] 1980/12/02 38 y.o.  Virtual Visit via Telephone Note  I connected with pt on 12/27/18 at  1:45 PM EDT by telephone and verified that I am speaking with the correct person using two identifiers.   I discussed the limitations, risks, security and privacy concerns of performing an evaluation and management service by telephone and the availability of in person appointments. I also discussed with the patient that there may be a patient responsible charge related to this service. The patient expressed understanding and agreed to proceed.   I discussed the assessment and treatment plan with the patient. The patient was provided an opportunity to ask questions and all were answered. The patient agreed with the plan and demonstrated an understanding of the instructions.   The patient was advised to call back or seek an in-person evaluation if the symptoms worsen or if the condition fails to improve as anticipated.  I provided 30 minutes of non-face-to-face time during this encounter.  The patient was located at home.  The provider was located at [place].   Thayer Headings, PMHNP   Subjective:   Patient ID:  Christine Chavez is a 38 y.o. (DOB 10-06-1980) female.  Chief Complaint:  Chief Complaint  Patient presents with  . Follow-up    Medication Management  . Depression  . Anxiety    HPI [place] presents for follow-up of depression and anxiety. Her friend Sharyn Lull also participates in call at request of pt. She reports that she has been more tired at night and the next morning. She reports that she has had some nausea for the last few weeks. She reports that she also has another infection and is on an antibiotic at this time. She is unsure if n/v is r/t antibiotic or medication.   Pt reports, "I've struggled a lot." Reports that she has been "emotional" the last few weeks. Reports that she started increase in Abilify to 5 mg around the  time of her infection about 1.5 weeks. Little to no improvement in depression. She reports "some days it is hard to get motivated." She reports that she gets discouraged at times since tasks are physically more challenging. She reports tearfulness "on and off." Occ irritability that she describes as frustration. She reports occ anxiety, such as yesterday with going to physical exam. Some anxiety related to making sure arrangements for children go smoothly. She reports that she worries frequently and is often wondering if she has offended someone. Reports that she worries more now about how people perceive her. Sleep has been ok. Able to fall asleep without difficulty with Remeron. Some middle of the night awakening due to bladder spasms. Appetite has been decreased due to nausea. Concentration has been impaired and has difficulty focusing on things, such as watching a movie. Denies SI.   She reports that she experiences frequent guilt. Reports that she is frequently isolated.   Past medication Trials: Remeron- Reports sleeping better at 15 mg but feels more lethargic upon awakening compared to 7.5 mg.  Sertraline- Has been on 200 mg for the last month and has not noticed a significant improvement. Cymbalta- Reports increased drowsiness and impaired concentration at 90 mg. Trintellix Rexulti-Increased heart rate and SOB Abilify Gabapentin- Causes drowsiness. Anxiety may have increased when she stopped taking TID. Reports that is did not seem to be effective for pain   Review of Systems:  Review of Systems  Gastrointestinal: Positive for nausea.  Genitourinary:  Bladder spasms. Current UTI  Musculoskeletal: Positive for gait problem.  Neurological: Negative for tremors.  Psychiatric/Behavioral:       Please refer to HPI    Medications: I have reviewed the patient's current medications.  Current Outpatient Medications  Medication Sig Dispense Refill  . belladonna-opium (B&O  SUPPRETTES) 16.2-30 MG suppository Place 30 mg rectally every 8 (eight) hours as needed for pain.    . butalbital-acetaminophen-caffeine (FIORICET, ESGIC) 50-325-40 MG tablet Take by mouth.    . Cyanocobalamin (VITAMIN B-12) 2500 MCG SUBL Take 2,500 mcg by mouth daily.    . folic acid (FOLVITE) 1 MG tablet Take 1 mg by mouth daily.    Marland Kitchen. LINZESS 72 MCG capsule TAKE 1 CAPSULE (72 MCG TOTAL) BY MOUTH ONCE DAILY    . LORazepam (ATIVAN) 0.5 MG tablet Take 1/2-1 tab po qd prn severe anxiety 15 tablet 1  . mirabegron ER (MYRBETRIQ) 50 MG TB24 tablet Take 50 mg by mouth daily.     . mirtazapine (REMERON) 30 MG tablet TAKE 1 TABLET (30 MG TOTAL) BY MOUTH AT BEDTIME (Patient taking differently: Take 15 mg by mouth at bedtime. ) 90 tablet 0  . ondansetron (ZOFRAN) 4 MG tablet Take 4 mg by mouth every 8 (eight) hours as needed for nausea or vomiting.    . OXYCODONE HCL PO Take 5 mg by mouth every 6 (six) hours as needed.    . pantoprazole (PROTONIX) 40 MG tablet Take 40 mg by mouth 2 (two) times daily.     . promethazine (PHENERGAN) 12.5 MG tablet Take 1 tablet (12.5 mg total) by mouth every 4 (four) hours as needed for nausea or vomiting. 20 tablet 0  . rizatriptan (MAXALT) 5 MG tablet Take 5 mg by mouth as needed.     . Trospium Chloride 60 MG CP24 Take by mouth.    . vortioxetine HBr (TRINTELLIX) 10 MG TABS tablet Take 1/2 tablet po qd with a meal for one week, then increase to 1 tablet daily with a meal 30 tablet 1  . gabapentin (NEURONTIN) 300 MG capsule Take 1 capsule (300 mg total) by mouth 2 (two) times daily. (Patient not taking: Reported on 11/18/2018)    . lithium carbonate 150 MG capsule Take 1 capsule (150 mg total) by mouth at bedtime. 30 capsule 1   No current facility-administered medications for this visit.     Medication Side Effects: Other: nausea, some fatigue  Allergies:  Allergies  Allergen Reactions  . Aspirin Shortness Of Breath, Swelling and Anaphylaxis  . Ibuprofen Shortness Of  Breath, Swelling and Anaphylaxis  . Morphine And Related Hives    Past Medical History:  Diagnosis Date  . Complication of anesthesia    ? seizures after anesthesia   . Headache   . Migraines   . Neurogenic bladder   . Renal disorder   . Vision abnormalities     Family History  Problem Relation Age of Onset  . Hypertension Mother   . Atrial fibrillation Father   . Healthy Brother   . Depression Brother   . Arthritis/Rheumatoid Paternal Grandmother   . Healthy Brother     Social History   Socioeconomic History  . Marital status: Married    Spouse name: Not on file  . Number of children: Not on file  . Years of education: Not on file  . Highest education level: Not on file  Occupational History  . Not on file  Social Needs  . Financial resource strain: Not  on file  . Food insecurity    Worry: Not on file    Inability: Not on file  . Transportation needs    Medical: Not on file    Non-medical: Not on file  Tobacco Use  . Smoking status: Never Smoker  . Smokeless tobacco: Never Used  Substance and Sexual Activity  . Alcohol use: No    Alcohol/week: 0.0 standard drinks  . Drug use: No  . Sexual activity: Yes    Birth control/protection: None    Comment: vasectomy  Lifestyle  . Physical activity    Days per week: Not on file    Minutes per session: Not on file  . Stress: Not on file  Relationships  . Social Musicianconnections    Talks on phone: Not on file    Gets together: Not on file    Attends religious service: Not on file    Active member of club or organization: Not on file    Attends meetings of clubs or organizations: Not on file    Relationship status: Not on file  . Intimate partner violence    Fear of current or ex partner: Not on file    Emotionally abused: Not on file    Physically abused: Not on file    Forced sexual activity: Not on file  Other Topics Concern  . Not on file  Social History Narrative  . Not on file    Past Medical History,  Surgical history, Social history, and Family history were reviewed and updated as appropriate.   Please see review of systems for further details on the patient's review from today.   Objective:   Physical Exam:  There were no vitals taken for this visit.  Physical Exam Neurological:     Mental Status: She is alert and oriented to person, place, and time.     Cranial Nerves: No dysarthria.  Psychiatric:        Attention and Perception: Attention normal.        Mood and Affect: Mood is anxious and depressed.        Speech: Speech normal.        Behavior: Behavior is cooperative.        Thought Content: Thought content normal. Thought content is not paranoid or delusional. Thought content does not include homicidal or suicidal ideation. Thought content does not include homicidal or suicidal plan.        Cognition and Memory: Cognition and memory normal.        Judgment: Judgment normal.     Lab Review:     Component Value Date/Time   NA 139 11/12/2018 0520   K 3.4 (L) 11/12/2018 0520   CL 110 11/12/2018 0520   CO2 24 11/12/2018 0520   GLUCOSE 91 11/12/2018 0520   BUN <5 (L) 11/12/2018 0520   CREATININE 0.65 11/12/2018 0520   CALCIUM 8.0 (L) 11/12/2018 0520   PROT 7.2 03/03/2017 2132   ALBUMIN 4.3 03/03/2017 2132   AST 17 03/03/2017 2132   ALT 15 03/03/2017 2132   ALKPHOS 51 03/03/2017 2132   BILITOT 1.7 (H) 03/03/2017 2132   GFRNONAA >60 11/12/2018 0520   GFRAA >60 11/12/2018 0520       Component Value Date/Time   WBC 7.2 11/12/2018 0520   RBC 3.59 (L) 11/12/2018 0520   HGB 11.2 (L) 11/12/2018 0520   HCT 33.8 (L) 11/12/2018 0520   HCT 31.7 (L) 04/28/2012 0618   PLT 252 11/12/2018 0520  MCV 94.2 11/12/2018 0520   MCH 31.2 11/12/2018 0520   MCHC 33.1 11/12/2018 0520   RDW 13.4 11/12/2018 0520   LYMPHSABS 3.3 03/03/2017 2132   MONOABS 0.6 03/03/2017 2132   EOSABS 0.1 03/03/2017 2132   BASOSABS 0.1 03/03/2017 2132    No results found for: POCLITH, LITHIUM    No results found for: PHENYTOIN, PHENOBARB, VALPROATE, CBMZ   .res Assessment: Plan:   Patient seen for 30 minutes and greater than 50% of visit spent counseling patient and friend regarding medication options and that about 70% of patients with depression have to try more than 1 medication in order to achieve response. Discussed stopping Abilify due to no response.  Advised patient to move trintellix to HS since she questions if it may be causing her some fatigue and recommended continuing to take Trintellix with food to minimize nausea. Discussed potential benefits, risks, and side effects of adding low-dose lithium for augmentation of depression.  Agree with plan to avoid making multiple changes in medications at once due to patient's history of adverse effects and also since she currently has a UTI and her mood and energy are typically lower when she has a current UTI.  Will therefore start Lithium 150 mg po QHS in one week for depression. Patient to follow-up in 3 to 4 weeks or sooner if clinically indicated. Patient advised to contact office with any questions, adverse effects, or acute worsening in signs and symptoms.  Christine Chavez was seen today for follow-up, depression and anxiety.  Diagnoses and all orders for this visit:  Severe episode of recurrent major depressive disorder, without psychotic features (HCC) -     lithium carbonate 150 MG capsule; Take 1 capsule (150 mg total) by mouth at bedtime.  Anxiety disorder, unspecified type  Primary insomnia    Please see After Visit Summary for patient specific instructions.  No future appointments.  No orders of the defined types were placed in this encounter.     -------------------------------

## 2018-12-27 LAB — CYTOLOGY - PAP
Diagnosis: UNDETERMINED — AB
HPV: NOT DETECTED

## 2019-01-23 ENCOUNTER — Other Ambulatory Visit: Payer: Self-pay | Admitting: Psychiatry

## 2019-01-23 DIAGNOSIS — F332 Major depressive disorder, recurrent severe without psychotic features: Secondary | ICD-10-CM

## 2019-01-30 ENCOUNTER — Other Ambulatory Visit: Payer: Self-pay | Admitting: Psychiatry

## 2019-01-30 DIAGNOSIS — F419 Anxiety disorder, unspecified: Secondary | ICD-10-CM

## 2019-01-30 DIAGNOSIS — F332 Major depressive disorder, recurrent severe without psychotic features: Secondary | ICD-10-CM

## 2019-02-06 ENCOUNTER — Telehealth: Payer: Self-pay | Admitting: Psychiatry

## 2019-02-06 NOTE — Telephone Encounter (Signed)
She's notified and verbalized understanding. Will contact us when she needs more Trintellix

## 2019-02-06 NOTE — Telephone Encounter (Signed)
Patient would like an increase on the on the Trintellix, her pcp doesn't think it's a good idea for her to take Lithium depression has gotten worse, patient has appt., on 11/10 @10 :30

## 2019-02-25 ENCOUNTER — Encounter: Payer: Self-pay | Admitting: Psychiatry

## 2019-02-25 ENCOUNTER — Ambulatory Visit (INDEPENDENT_AMBULATORY_CARE_PROVIDER_SITE_OTHER): Payer: BC Managed Care – PPO | Admitting: Psychiatry

## 2019-02-25 ENCOUNTER — Other Ambulatory Visit: Payer: Self-pay | Admitting: Psychiatry

## 2019-02-25 DIAGNOSIS — F419 Anxiety disorder, unspecified: Secondary | ICD-10-CM | POA: Diagnosis not present

## 2019-02-25 DIAGNOSIS — F332 Major depressive disorder, recurrent severe without psychotic features: Secondary | ICD-10-CM

## 2019-02-25 DIAGNOSIS — F5101 Primary insomnia: Secondary | ICD-10-CM

## 2019-02-25 MED ORDER — VORTIOXETINE HBR 20 MG PO TABS: 20.0000 mg | ORAL_TABLET | Freq: Every day | ORAL | 0 refills | Status: DC

## 2019-02-25 MED ORDER — MIRTAZAPINE 15 MG PO TABS: 15.0000 mg | ORAL_TABLET | Freq: Every day | ORAL | 1 refills | Status: DC

## 2019-02-25 NOTE — Progress Notes (Signed)
Christine Chavez [id number] Oct 29, 1980 38 y.o.  Virtual Visit via Telephone Note  I connected with pt on 02/26/19 at 10:30 AM EST by telephone and verified that I am speaking with the correct person using two identifiers.   I discussed the limitations, risks, security and privacy concerns of performing an evaluation and management service by telephone and the availability of in person appointments. I also discussed with the patient that there may be a patient responsible charge related to this service. The patient expressed understanding and agreed to proceed.   I discussed the assessment and treatment plan with the patient. The patient was provided an opportunity to ask questions and all were answered. The patient agreed with the plan and demonstrated an understanding of the instructions.   The patient was advised to call back or seek an in-person evaluation if the symptoms worsen or if the condition fails to improve as anticipated.  I provided 30 minutes of non-face-to-face time during this encounter.  The patient was located at home.  The provider was located at [facility].   Corie Chiquito, PMHNP   Subjective:   Patient ID:  Christine Chavez is a 38 y.o. (DOB 1980/11/24) female.  Chief Complaint:  Chief Complaint  Patient presents with  . Depression  . Anxiety    HPI [facility] [place] presents for follow-up of depression, anxiety, and sleep disturbance. Reports that she has been having multiple urinary s/s and repeated UTI's and has scope scheduled this afternoon. She has been referred to pain management due to allergies and adverse reactions to pain medications. Pain management has referred her to psychologist in their clinic that specializes in chronic pain.  She reports that her mood has been depressed and attributes this to her health issues. Reports that crying has decreased some since she increased Trintellix on 02/06/19. Mood remains persistently depressed.  She reports that she continues to have anxiety, particularly in certain situations. Reports that she had anxiety recently when she had to go to ER and husband was initially not allowed to go back with her. Denies full blown panic attacks and has experienced some panic s/s at times. Has frequent worry. Reports that she worries about her family and how her health issues affect them. She reports sleep has improved since starting Lyrica and Remeron. Appetite has been decreased with infections. She reports having significant fatigue. Reports that she feels sluggish upon awakening. Motivation has been low. Concentration has been difficult at times. Reports excessive feelings of guilt r/t her health issues and feels that she is a burden to others at times. Denies SI.   Past medication Trials: Remeron- Reports sleeping better at 15 mg but feels more lethargic upon awakening compared to 7.5 mg.  Sertraline- Has been on 200 mg for the last month and has not noticed a significant improvement. Cymbalta- Reports increased drowsiness and impaired concentration at 90 mg. Trintellix Rexulti-Increased heart rate and SOB Abilify Gabapentin- Causes drowsiness. Anxiety may have increased when she stopped taking TID. Reports that is did not seem to be effective for pain Lyrica Did not start Lithium since PCP advised against it  Review of Systems:  Review of Systems  Constitutional: Positive for fatigue.  Gastrointestinal: Positive for nausea.  Genitourinary:       Reports frequent urinary s/s and recurrent UTI's  Musculoskeletal: Positive for gait problem.  Psychiatric/Behavioral:       Please refer to HPI    Medications: I have reviewed the patient's current medications.  Current  Outpatient Medications  Medication Sig Dispense Refill  . B Complex Vitamins (VITAMIN B COMPLEX 100) INJ Inject as directed.    . Cyanocobalamin (VITAMIN B-12) 2500 MCG SUBL Take 2,500 mcg by mouth daily.    . folic acid  (FOLVITE) 1 MG tablet Take 1 mg by mouth daily.    Marland Kitchen LINZESS 72 MCG capsule TAKE 1 CAPSULE (72 MCG TOTAL) BY MOUTH ONCE DAILY    . mirabegron ER (MYRBETRIQ) 50 MG TB24 tablet Take 50 mg by mouth daily.     . mirtazapine (REMERON) 15 MG tablet Take 1 tablet (15 mg total) by mouth at bedtime. 90 tablet 1  . ondansetron (ZOFRAN) 4 MG tablet Take 4 mg by mouth every 8 (eight) hours as needed for nausea or vomiting.    . OXYCODONE HCL PO Take 5 mg by mouth every 6 (six) hours as needed.    . pantoprazole (PROTONIX) 40 MG tablet Take 40 mg by mouth 2 (two) times daily.     . pregabalin (LYRICA) 50 MG capsule TAKE ONE CAP IN AM, TWO CAPS AT BEDTIME    . rizatriptan (MAXALT) 5 MG tablet Take 5 mg by mouth as needed.     . Trospium Chloride 60 MG CP24 Take by mouth.    . vortioxetine HBr (TRINTELLIX) 20 MG TABS tablet Take 1 tablet (20 mg total) by mouth daily. 90 tablet 0  . belladonna-opium (B&O SUPPRETTES) 16.2-30 MG suppository Place 30 mg rectally every 8 (eight) hours as needed for pain.    . butalbital-acetaminophen-caffeine (FIORICET, ESGIC) 50-325-40 MG tablet Take by mouth.    . gabapentin (NEURONTIN) 300 MG capsule Take 1 capsule (300 mg total) by mouth 2 (two) times daily. (Patient not taking: Reported on 11/18/2018)    . LORazepam (ATIVAN) 0.5 MG tablet Take 1/2-1 tab po qd prn severe anxiety (Patient not taking: Reported on 02/25/2019) 15 tablet 1  . promethazine (PHENERGAN) 12.5 MG tablet Take 1 tablet (12.5 mg total) by mouth every 4 (four) hours as needed for nausea or vomiting. 20 tablet 0   No current facility-administered medications for this visit.     Medication Side Effects: Fatigue  Occ nausea that seems to be unrelated to Trintellix  Allergies:  Allergies  Allergen Reactions  . Aspirin Shortness Of Breath, Swelling and Anaphylaxis  . Ibuprofen Shortness Of Breath, Swelling and Anaphylaxis  . Morphine And Related Hives    Past Medical History:  Diagnosis Date  .  Complication of anesthesia    ? seizures after anesthesia   . Headache   . Migraines   . Neurogenic bladder   . Renal disorder   . Vision abnormalities     Family History  Problem Relation Age of Onset  . Hypertension Mother   . Atrial fibrillation Father   . Healthy Brother   . Depression Brother   . Arthritis/Rheumatoid Paternal Grandmother   . Healthy Brother     Social History   Socioeconomic History  . Marital status: Married    Spouse name: Not on file  . Number of children: Not on file  . Years of education: Not on file  . Highest education level: Not on file  Occupational History  . Not on file  Social Needs  . Financial resource strain: Not on file  . Food insecurity    Worry: Not on file    Inability: Not on file  . Transportation needs    Medical: Not on file    Non-medical: Not  on file  Tobacco Use  . Smoking status: Never Smoker  . Smokeless tobacco: Never Used  Substance and Sexual Activity  . Alcohol use: No    Alcohol/week: 0.0 standard drinks  . Drug use: No  . Sexual activity: Yes    Birth control/protection: None    Comment: vasectomy  Lifestyle  . Physical activity    Days per week: Not on file    Minutes per session: Not on file  . Stress: Not on file  Relationships  . Social Musicianconnections    Talks on phone: Not on file    Gets together: Not on file    Attends religious service: Not on file    Active member of club or organization: Not on file    Attends meetings of clubs or organizations: Not on file    Relationship status: Not on file  . Intimate partner violence    Fear of current or ex partner: Not on file    Emotionally abused: Not on file    Physically abused: Not on file    Forced sexual activity: Not on file  Other Topics Concern  . Not on file  Social History Narrative  . Not on file    Past Medical History, Surgical history, Social history, and Family history were reviewed and updated as appropriate.   Please see  review of systems for further details on the patient's review from today.   Objective:   Physical Exam:  There were no vitals taken for this visit.  Physical Exam Neurological:     Mental Status: She is alert and oriented to person, place, and time.     Cranial Nerves: No dysarthria.  Psychiatric:        Attention and Perception: Attention normal.        Mood and Affect: Mood is anxious and depressed.        Speech: Speech normal.        Behavior: Behavior is cooperative.        Thought Content: Thought content normal. Thought content is not paranoid or delusional. Thought content does not include homicidal or suicidal ideation. Thought content does not include homicidal or suicidal plan.        Cognition and Memory: Cognition and memory normal.        Judgment: Judgment normal.     Comments: Insight intact     Lab Review:     Component Value Date/Time   NA 139 11/12/2018 0520   K 3.4 (L) 11/12/2018 0520   CL 110 11/12/2018 0520   CO2 24 11/12/2018 0520   GLUCOSE 91 11/12/2018 0520   BUN <5 (L) 11/12/2018 0520   CREATININE 0.65 11/12/2018 0520   CALCIUM 8.0 (L) 11/12/2018 0520   PROT 7.2 03/03/2017 2132   ALBUMIN 4.3 03/03/2017 2132   AST 17 03/03/2017 2132   ALT 15 03/03/2017 2132   ALKPHOS 51 03/03/2017 2132   BILITOT 1.7 (H) 03/03/2017 2132   GFRNONAA >60 11/12/2018 0520   GFRAA >60 11/12/2018 0520       Component Value Date/Time   WBC 7.2 11/12/2018 0520   RBC 3.59 (L) 11/12/2018 0520   HGB 11.2 (L) 11/12/2018 0520   HCT 33.8 (L) 11/12/2018 0520   HCT 31.7 (L) 04/28/2012 0618   PLT 252 11/12/2018 0520   MCV 94.2 11/12/2018 0520   MCH 31.2 11/12/2018 0520   MCHC 33.1 11/12/2018 0520   RDW 13.4 11/12/2018 0520   LYMPHSABS 3.3 03/03/2017 2132  MONOABS 0.6 03/03/2017 2132   EOSABS 0.1 03/03/2017 2132   BASOSABS 0.1 03/03/2017 2132    No results found for: POCLITH, LITHIUM   No results found for: PHENYTOIN, PHENOBARB, VALPROATE, CBMZ    .res Assessment: Plan:   Patient seen for 30 minutes and greater than 50% of visit spent counseling patient and coordination of care.  Discussed that more time is needed to determine response to recent increase in Trintellix to 20 mg daily.  Also discussed that Lyrica can be used to treat both mood and anxiety signs and symptoms and therefore it may also be best to await response to recently starting Lyrica and plan to increase titration of Lyrica before introducing a new medication.  Patient is in agreement with this plan and will follow up sooner in approximately 3 to 4 weeks. Agree with plan for patient to see psychologist with pain management since this would likely be helpful for both pain, mood, and anxiety. Patient advised to contact office with any questions, adverse effects, or acute worsening in signs and symptoms.   [place] was seen today for depression and anxiety.  Diagnoses and all orders for this visit:  Severe episode of recurrent major depressive disorder, without psychotic features (HCC) -     vortioxetine HBr (TRINTELLIX) 20 MG TABS tablet; Take 1 tablet (20 mg total) by mouth daily. -     mirtazapine (REMERON) 15 MG tablet; Take 1 tablet (15 mg total) by mouth at bedtime.  Anxiety disorder, unspecified type -     vortioxetine HBr (TRINTELLIX) 20 MG TABS tablet; Take 1 tablet (20 mg total) by mouth daily.  Primary insomnia -     mirtazapine (REMERON) 15 MG tablet; Take 1 tablet (15 mg total) by mouth at bedtime.    Please see After Visit Summary for patient specific instructions.  No future appointments.  No orders of the defined types were placed in this encounter.     -------------------------------

## 2019-02-25 NOTE — Telephone Encounter (Signed)
Last note mentions 15 mg, okay to send for 30 mg again?

## 2019-03-11 ENCOUNTER — Other Ambulatory Visit: Payer: Self-pay

## 2019-03-11 ENCOUNTER — Ambulatory Visit: Payer: BC Managed Care – PPO | Attending: Gastroenterology

## 2019-03-11 DIAGNOSIS — R262 Difficulty in walking, not elsewhere classified: Secondary | ICD-10-CM | POA: Diagnosis present

## 2019-03-11 DIAGNOSIS — M62838 Other muscle spasm: Secondary | ICD-10-CM | POA: Diagnosis not present

## 2019-03-11 DIAGNOSIS — M6281 Muscle weakness (generalized): Secondary | ICD-10-CM | POA: Diagnosis present

## 2019-03-11 NOTE — Therapy (Addendum)
[place] MAIN [facility] SERVICES [address] [place], [place], [address] Phone: [phone]   Fax:  [phone]  Physical Therapy Evaluation  The patient has been informed of current processes in place at Outpatient Rehab to protect patients from Covid-19 exposure including social distancing, schedule modifications, and new cleaning procedures. After discussing their particular risk with a therapist based on the patient's personal risk factors, the patient has decided to proceed with in-person therapy.   Patient Details  Name: Christine Chavez MRN: [id number] Date of Birth: 14-Jul-1980 Referring Provider (PT): [name]   Encounter Date: 03/11/2019  PT End of Session - 03/16/19 1420    Visit Number  1    Number of Visits  20    Date for PT Re-Evaluation  05/21/19    Authorization Type  [employer]    Authorization Time Period  from 03/12/2019 through 05/21/2019    Authorization - Visit Number  1    Authorization - Number of Visits  20    PT Start Time  1003    PT Stop Time  1107    PT Time Calculation (min)  64 min    Activity Tolerance  No increased pain;Patient tolerated treatment well    Behavior During Therapy  [facility] for tasks assessed/performed       Past Medical History:  Diagnosis Date  . Complication of anesthesia    ? seizures after anesthesia   . Headache   . Migraines   . Neurogenic bladder   . Renal disorder   . Vision abnormalities     Past Surgical History:  Procedure Laterality Date  . ANTERIOR CRUCIATE LIGAMENT REPAIR  1997  . APPENDECTOMY    . COLONOSCOPY WITH PROPOFOL N/A 11/11/2018   Procedure: COLONOSCOPY WITH PROPOFOL;  Surgeon: [name], MD;  Location: [facility] ENDOSCOPY;  Service: Endoscopy;  Laterality: N/A;  . CYSTOSCOPY WITH STENT PLACEMENT Right 04/17/2016   Procedure: CYSTOSCOPY WITH STENT PLACEMENT;  Surgeon: [name], MD;  Location: [facility] ORS;  Service: Urology;  Laterality:  Right;  . ESOPHAGOGASTRODUODENOSCOPY (EGD) WITH PROPOFOL N/A 11/11/2018   Procedure: ESOPHAGOGASTRODUODENOSCOPY (EGD) WITH PROPOFOL;  Surgeon: [name], MD;  Location: [facility] ENDOSCOPY;  Service: Endoscopy;  Laterality: N/A;  . EXPLORATORY LAPAROTOMY  1999  . KIDNEY STONE SURGERY  04/2016  . REVISION UROSTOMY CUTANEOUS    . REVISION UROSTOMY CUTANEOUS  01/10/2018  . SUPRAPUBIC CATHETER PLACEMENT  08/2017  . TONSILLECTOMY      There were no vitals filed for this visit.     Pelvic Floor Physical Therapy Evaluation and Assessment  SCREENING  Falls in last 6 mo: none  Red Flags:  Have you had any night sweats? no Unexplained weight loss? no Saddle anesthesia? no Unexplained changes in bowel or bladder habits? no  SUBJECTIVE  Patient reports: The first time she had problems with her bladder was following a lumbar puncture and she had to be intermittently catheterized. She started seeing neurology and Urology and was told that they think it has to do with what is going on with her bladder. Had a kidney stone in Jan 2019 that was removed and stints were placed and she was sent home with a foley for a week. When they removed the catheter (after intense spasms) she was self-catheterizing so a supra-pubic catherter was placed which was "a bloody uncomfortable mess" which her body rejected and she had to end up having a urostomy. She is still having to self-catheterizing.  Still has the urge to urinate, the urostomy is higher than the bladder so she is still having to cath throughout the day with not much urine getting into the bag, more is there at night.  Had "a break" in the pain as she was finishing her Augmentin which she was on for a bladder infection but it has gotten worse again but there is not any active growth, urology told her to take keflex but infectious disease wants her not to because they are afraid she will build an intolerance.    Using a Rolator walker at home. Feels  more fatigued and bladder symptoms get worse throughout the day.  Precautions:  Neurological diagnosis pending, has "non-specific" brain lesions   Social/Family/Vocational History:   Married, 3 children, homemaking, children in school (in-person at [employer])  Recent Procedures/Tests/Findings:  2 brain scans, describing some nondescript lesions but no diagnosis yet. Bladder scope showed that there was "evidence" of recurrent infections "scarring or something".  Obstetrical History: 3 vaginal deliveries, tearing with all 3 but not severe.  Gynecological History: Has had "crazy dark" pasty blood with her periods since the onset of her symptoms  Urinary History: Urostomy, in the beginning she could pee first thing in the morning but had to cath throughout the day. Continues to get strong sensation to urinate that hurts.  Drinking 80-100 oz per day of "something" drinks ~ 40-60 of water and 1 cup of coffee in the morining, sometimes one in the afternoon. Drinks ~ (one or the other) Dr. Reino Kent (~ 20 oz.) and sweet tea (8-10 oz) in a day.  Gastrointestinal History: Having constipation, concerns that she may be having a neurogeninc bowel as well. Was not able to expel balloon,   Sexual activity/pain: Had h/o pain with intercourse before but Pain with intercourse has been worse since the urostomy  Location of pain: LBP, bladder/urethra/vaginal, and vaginal/rectal with intercourse, migraines Current pain:  4/10  Max pain:  10/10 Least pain:  2/10 Nature of pain: non-stop urge to urinate and pain at end of emptying "stab"  Patient Goals: Get rid of the discomfort with catheterizing and ache/bladder pain. Be able to take fewer medications and be able to enjoy spending time with her family again, doing "all the mom things"    OBJECTIVE  Posture/Observations: Arrives in push w/c Sitting: forward slumped, knees fall medially Standing:   Palpation/Segmental Motion/Joint Play:  Deferred to follow-up  Special tests:   Deferred to follow-up   Range of Motion/Flexibilty: Deferred to follow-up Spine: Hips:   Strength/MMT: Deferred to follow-up (prior PT assessed as grossly 4/5 for BLE)  LE MMT  LE MMT Left Right  Hip flex:  (L2) /5 /5  Hip ext: /5 /5  Hip abd: /5 /5  Hip add: /5 /5  Hip IR /5 /5  Hip ER /5 /5     Abdominal: Deferred to follow up Palpation: Diastasis:  Pelvic Floor External Exam: Deferred to follow-up Introitus Appears:  Skin integrity:  Palpation: Cough: Prolapse visible?: Scar mobility:  Internal Vaginal Exam: Strength (PERF):  Symmetry: Palpation: Prolapse:   Internal Rectal Exam: Strength (PERF): Symmetry: Palpation: Prolapse:   Gait Analysis:   Pelvic Floor Outcome Measures: PFDI: 110/300, Female NIH-CPSI: 29/43 (67%)  INTERVENTIONS THIS SESSION: Self-care: Educated on the structure and function of the pelvic floor in relation to their symptoms as well as the POC, and initial HEP in order to set patient expectations and understanding from which we will build on in the future  sessions. Educated on practicing diaphragmatic breathing to prepare for upcoming treatment and decrease pain.   Total time: 64 min.                Objective measurements completed on examination: See above findings.                PT Short Term Goals - 03/16/19 1421      PT SHORT TERM GOAL #1   Title  Patient will demonstrate coordinated diaphragmatic breathing with pelvic tilts to demonstrate improved control of diaphragm and TA, to allow for further strengthening of core musculature and decreased pelvic floor spasm.    Baseline  Pt. demonstrates breathing dysfunction and poor PFM coordination evidenced by anal manometry    Time  5    Period  Weeks    Status  New    Target Date  04/20/19      PT SHORT TERM GOAL #2   Title  Patient will report a reduction in pain to no greater than 6/10 over the prior  week to demonstrate symptom improvement.    Baseline  Pain is 10/10 at worst, 2/10 at best    Time  5    Period  Weeks    Status  New    Target Date  04/20/19      PT SHORT TERM GOAL #3   Title  Patient will demonstrate HEP x1 in the clinic to demonstrate understanding and proper form to allow for further improvement.    Baseline  Pt. lacks knowledge of therepeutic exercises that can decrease her pain/Sx.    Time  5    Period  Weeks    Status  New    Target Date  04/20/19      PT SHORT TERM GOAL #4   Title  Patient will report consistent use of foot-stool (squatty-potty) for positioning with BM to decrease pain with BM and intra-abdominal pressure.    Baseline  Pt. having constipation due to PFM dysfunction    Time  5    Period  Weeks    Status  New    Target Date  04/20/19      PT SHORT TERM GOAL #5   Baseline  --        PT Long Term Goals - 03/16/19 1429      PT LONG TERM GOAL #1   Title  Pt. will be able to participate in regular ADL's with least restrictive device without pain increasing greater than 2/10    Baseline  Pt. limited in her ability to perform household duties by increased pain and fatigue.    Time  10    Period  Weeks    Status  New    Target Date  05/25/19      PT LONG TERM GOAL #2   Title  Patient will score at or below 65/300  on the PFDI and 35% on the Female NIH-CPSI to demonstrate a clinically meaningful decrease in disability and distress due to pelvic floor dysfunction.    Baseline  PFDI: 110/300, Female NIH-CPSI: 29/43 (67%)    Time  10    Period  Weeks    Status  New    Target Date  05/25/19      PT LONG TERM GOAL #3   Title  Patient will report no pain with intercourse to demonstrate improved functional ability.    Baseline  Pt. Having significant pain with intercourse    Time  10  Period  Weeks    Status  New    Target Date  05/25/19      PT LONG TERM GOAL #4   Title  Pt will report ability to work in her yard for greater than 30  minutes without extreme fatigue    Baseline  limited to 10-15 minutes before pt requires ending activity    Time  10    Period  Weeks    Status  New    Target Date  05/25/19      PT LONG TERM GOAL #5   Title  Pt. will be able to go 2-3 hours between emptying her bladder without increased pain and empty her bladder fully whether by urostomy, cath, or voluntary release.    Baseline  Pt. has a urostomy but continues to have to self-catheterize. Has pain with bladder filling and when using catheter.    Time  10    Period  Weeks    Status  New    Target Date  05/25/19      Additional Long Term Goals   Additional Long Term Goals  Yes      PT LONG TERM GOAL #6   Title  Patient will report having BM's at least every-other day with consistency between [facility] stool scale 3-5 over the prior week to demonstrate decreased constipation.    Baseline  Pt. unable to have regula BM's without medication, manometry shows PFM dysfunction    Time  10    Period  Weeks    Status  New    Target Date  05/25/19             Plan - 03/16/19 1420    Clinical Impression Statement  Pt. is a 38 y/o female who presents today with cheif c/o pain with bladder filling, pain with use of self catheter, constipation, pelvic pain, and dyspareunia. Her PMH is significant for a neurologic insult of unknown diagnosis, performance of spinal tap and resulting neurogenic bladder (per pt. report), multiple UTI's, BLE weakness, urostomy placement, and depression among others. Due to involved history, limited objective data collected at this visit but Pt. history and presentation heavily suggest presence of spasms in the back and PFM. Pt. posture in seated demonstrates very poor core strength/endurance, postural awareness and likely reflect her depression as well. Pt. saw outpatient PT in 2018 and BLE MMT was recorded at ~ 4/5 grossly with similar functional status noted at the end of her therapy that is described by Pt. today,  being able to use rollator within the home but requiring w/c for community activity. Anorectal manometry performed on 01/08/19 describes poor PFM coordination which has been shown to benefit from pelvic health PT including potential use of biofeedback. She will benefit from skilled pelvic health PT to further assess PFM and to address noted defecits to decrease pain, improve symptoms, increase QOL, and improve ability to participate in meaningful activities to improve mental and physical health.    Personal Factors and Comorbidities  Comorbidity 3+    Comorbidities  neurologic insult with pending diagnosis, multiple recurrent UTI's, BLE weakness, depression    Examination-Activity Limitations  Caring for Others;Carry;Continence;Squat;Stairs;Toileting;Locomotion Level;Lift;Stand    Examination-Participation Restrictions  Church;Cleaning;Community Activity;Driving;Interpersonal Relationship;Laundry;Yard Work;Meal Prep    Stability/Clinical Decision Making  Unstable/Unpredictable    Clinical Decision Making  High    Rehab Potential  Fair    PT Frequency  2x / week    PT Duration  Other (comment)    PT  Treatment/Interventions  ADLs/Self Care Home Management;Aquatic Therapy;Biofeedback;Moist Heat;Electrical Stimulation;Cryotherapy;Traction;Ultrasound;Therapeutic activities;Functional mobility training;Stair training;Gait training;Therapeutic exercise;Balance training;Neuromuscular re-education;Patient/family education;Manual techniques;Dry needling;Scar mobilization;Energy conservation;Taping;Joint Manipulations;Spinal Manipulations;Visual/perceptual remediation/compensation    PT Next Visit Plan  Objective assessment of strength/ROM and PFM if appropriate    PT Home Exercise Plan  diaphragmatic breathing    Consulted and Agree with Plan of Care  Patient       Patient will benefit from skilled therapeutic intervention in order to improve the following deficits and impairments:  Abnormal gait, Decreased  balance, Decreased endurance, Decreased mobility, Difficulty walking, Increased muscle spasms, Impaired sensation, Improper body mechanics, Impaired tone, Decreased activity tolerance, Decreased coordination, Decreased strength, Postural dysfunction, Pain  Visit Diagnosis: Other muscle spasm  Muscle weakness (generalized)  Difficulty in walking, not elsewhere classified     Problem List Patient Active Problem List   Diagnosis Date Noted  . Seizure (HCC) 11/11/2018  . Major depressive disorder, recurrent episode, moderate (HCC) 02/07/2018  . Nephrolithiasis 04/16/2016  . Numbness 07/28/2015  . Bladder retention 06/23/2015  . Abdominal pain 06/04/2015  . Dizziness 05/18/2015  . Neck pain 05/18/2015  . Complicated migraine 04/28/2015  . Other fatigue 04/28/2015  . Abnormal finding on MRI of brain 04/28/2015  . D (diarrhea) 03/29/2015  . H/O disease 03/29/2015  . Abnormal weight loss 03/29/2015  . Muscle weakness (generalized) 03/14/2015  . Headache, migraine 03/10/2015   Cleophus MoltKeeli T.  DPT, ATC Cleophus MoltKeeli T  03/16/2019, 2:42 PM  [place] [facility] [address] [place], [place], [address] Phone: [phone]   Fax:  [phone]  Name: Christine MylarMelissa Dawn Chavez MRN: [id number] Date of Birth: April 07, 1981

## 2019-03-12 NOTE — Patient Instructions (Signed)
Instructed to look up diaphragmatic breathing video on youtube and practice before next visit.

## 2019-03-16 NOTE — Addendum Note (Signed)
Addended by: Letitia Libra T on: 03/16/2019 02:47 PM   Modules accepted: Orders

## 2019-03-18 ENCOUNTER — Ambulatory Visit: Payer: BC Managed Care – PPO

## 2019-03-19 NOTE — Addendum Note (Signed)
Addended by: Letitia Libra T on: 03/19/2019 12:17 PM   Modules accepted: Orders

## 2019-03-21 ENCOUNTER — Ambulatory Visit (INDEPENDENT_AMBULATORY_CARE_PROVIDER_SITE_OTHER): Payer: BC Managed Care – PPO | Admitting: Psychiatry

## 2019-03-21 ENCOUNTER — Other Ambulatory Visit: Payer: Self-pay

## 2019-03-21 ENCOUNTER — Encounter: Payer: Self-pay | Admitting: Psychiatry

## 2019-03-21 DIAGNOSIS — F5101 Primary insomnia: Secondary | ICD-10-CM

## 2019-03-21 DIAGNOSIS — F419 Anxiety disorder, unspecified: Secondary | ICD-10-CM | POA: Diagnosis not present

## 2019-03-21 DIAGNOSIS — F331 Major depressive disorder, recurrent, moderate: Secondary | ICD-10-CM

## 2019-03-21 MED ORDER — VORTIOXETINE HBR 20 MG PO TABS: 20.0000 mg | ORAL_TABLET | Freq: Every day | ORAL | 0 refills | Status: DC

## 2019-03-21 NOTE — Progress Notes (Signed)
Christine Chavez [id number] July 15, 1980 38 y.o.  Virtual Visit via Telephone Note  I connected with pt on 03/21/19 at  9:00 AM EST by telephone and verified that I am speaking with the correct person using two identifiers.   I discussed the limitations, risks, security and privacy concerns of performing an evaluation and management service by telephone and the availability of in person appointments. I also discussed with the patient that there may be a patient responsible charge related to this service. The patient expressed understanding and agreed to proceed.   I discussed the assessment and treatment plan with the patient. The patient was provided an opportunity to ask questions and all were answered. The patient agreed with the plan and demonstrated an understanding of the instructions.   The patient was advised to call back or seek an in-person evaluation if the symptoms worsen or if the condition fails to improve as anticipated.  I provided 30 minutes of non-face-to-face time during this encounter.  The patient was located at home.  The provider was located at [facility].   Corie Chiquito, PMHNP   Subjective:   Patient ID:  Christine Chavez is a 38 y.o. (DOB 06-16-1980) female.  Chief Complaint:  Chief Complaint  Patient presents with  . Follow-up    Depression, anxiety, sleep disturbance    HPI [address] [place] presents for follow-up of depression, anxiety, and insomnia. She reports "maybe some improvement" in mood and anxiety.  She reports that she initially feels like she is "in a fog" right after taking Trintellix and Lyrica in the morning. She reports foggy feeling can last throughout the day and can last until at least noon. Rates depression a 4-5 out of 10 with 10= most depressed mood imaginable and reports that mood was previously a 10 out of 10. She reports that she has had some improvements with pain and recognizes a correlation between pain and mood.    She reports that she continues to experience some anxiety, particularly with the holidays and her husband working long hours. "I think I have kind of maintained." She reports that overall she thinks she is handling stressors better overall. She reports occ crying episodes, but now crying occurs only in response with identifiable trigger. No longer crying daily. She reports that she is sleeping well overall with current medications. Appetite has improved some and this may also be related to improved physical conditions. Reports feeling fatigued most of the time and is unsure if this is related to meds, medical conditions, or other factors. Describes difficulty getting going in the morning. She reports that motivation is there but is physically limited. Concentration is difficult. Denies SI.   Past medication Trials: Remeron- Reports sleeping better at 15 mg but feels more lethargic upon awakening compared to 7.5 mg.  Sertraline- Has been on 200 mg for the last month and has not noticed a significant improvement. Cymbalta- Reports increased drowsiness and impaired concentration at 90 mg. Trintellix Rexulti-Increased heart rate and SOB Abilify Gabapentin- Causes drowsiness. Anxiety may have increased when she stopped taking TID. Reports that is did not seem to be effective for pain Lyrica Did not start Lithium since PCP advised against it  Review of Systems:  Review of Systems  Gastrointestinal:       Decreased nausea  Genitourinary:       Repeated UTI's  Musculoskeletal: Positive for gait problem.  Neurological:       Occ loses sensation in lower extremities and feels pins  and needles.  Psychiatric/Behavioral:       Please refer to HPI    Sees pain specialist on Monday and pain psychologist later next week. Sees urology specialist later today.   Medications: I have reviewed the patient's current medications.  Current Outpatient Medications  Medication Sig Dispense Refill  . B  Complex Vitamins (VITAMIN B COMPLEX 100) INJ Inject as directed.    . belladonna-opium (B&O SUPPRETTES) 16.2-30 MG suppository Place 30 mg rectally every 8 (eight) hours as needed for pain.    . butalbital-acetaminophen-caffeine (FIORICET, ESGIC) 50-325-40 MG tablet Take by mouth.    . Cyanocobalamin (VITAMIN B-12) 2500 MCG SUBL Take 2,500 mcg by mouth daily.    . folic acid (FOLVITE) 1 MG tablet Take 1 mg by mouth daily.    Marland Kitchen LINZESS 72 MCG capsule TAKE 1 CAPSULE (72 MCG TOTAL) BY MOUTH ONCE DAILY    . mirabegron ER (MYRBETRIQ) 50 MG TB24 tablet Take 50 mg by mouth daily.     . mirtazapine (REMERON) 15 MG tablet Take 1 tablet (15 mg total) by mouth at bedtime. 90 tablet 1  . ondansetron (ZOFRAN) 4 MG tablet Take 4 mg by mouth every 8 (eight) hours as needed for nausea or vomiting.    Marland Kitchen oxybutynin (DITROPAN) 5 MG tablet Take 5 mg by mouth 3 (three) times daily.    . OXYCODONE HCL PO Take 5 mg by mouth every 6 (six) hours as needed.    . pantoprazole (PROTONIX) 40 MG tablet Take 40 mg by mouth 2 (two) times daily.     . pregabalin (LYRICA) 50 MG capsule TAKE ONE CAP IN AM, TWO CAPS AT BEDTIME    . promethazine (PHENERGAN) 12.5 MG tablet Take 1 tablet (12.5 mg total) by mouth every 4 (four) hours as needed for nausea or vomiting. 20 tablet 0  . rizatriptan (MAXALT) 5 MG tablet Take 5 mg by mouth as needed.     . Trospium Chloride 60 MG CP24 Take by mouth.    . vortioxetine HBr (TRINTELLIX) 20 MG TABS tablet Take 1 tablet (20 mg total) by mouth daily. 90 tablet 0  . gabapentin (NEURONTIN) 300 MG capsule Take 1 capsule (300 mg total) by mouth 2 (two) times daily. (Patient not taking: Reported on 03/21/2019)    . LORazepam (ATIVAN) 0.5 MG tablet Take 1/2-1 tab po qd prn severe anxiety (Patient not taking: Reported on 03/21/2019) 15 tablet 1   No current facility-administered medications for this visit.     Medication Side Effects: Other: Improved nausea  Allergies:  Allergies  Allergen Reactions   . Aspirin Shortness Of Breath, Swelling and Anaphylaxis  . Ibuprofen Shortness Of Breath, Swelling and Anaphylaxis  . Morphine And Related Hives    Past Medical History:  Diagnosis Date  . Complication of anesthesia    ? seizures after anesthesia   . Headache   . Migraines   . Neurogenic bladder   . Renal disorder   . Vision abnormalities     Family History  Problem Relation Age of Onset  . Hypertension Mother   . Atrial fibrillation Father   . Healthy Brother   . Depression Brother   . Arthritis/Rheumatoid Paternal Grandmother   . Healthy Brother     Social History   Socioeconomic History  . Marital status: Married    Spouse name: Not on file  . Number of children: Not on file  . Years of education: Not on file  . Highest education level: Not  on file  Occupational History  . Not on file  Social Needs  . Financial resource strain: Not on file  . Food insecurity    Worry: Not on file    Inability: Not on file  . Transportation needs    Medical: Not on file    Non-medical: Not on file  Tobacco Use  . Smoking status: Never Smoker  . Smokeless tobacco: Never Used  Substance and Sexual Activity  . Alcohol use: No    Alcohol/week: 0.0 standard drinks  . Drug use: No  . Sexual activity: Yes    Birth control/protection: None    Comment: vasectomy  Lifestyle  . Physical activity    Days per week: Not on file    Minutes per session: Not on file  . Stress: Not on file  Relationships  . Social Herbalist on phone: Not on file    Gets together: Not on file    Attends religious service: Not on file    Active member of club or organization: Not on file    Attends meetings of clubs or organizations: Not on file    Relationship status: Not on file  . Intimate partner violence    Fear of current or ex partner: Not on file    Emotionally abused: Not on file    Physically abused: Not on file    Forced sexual activity: Not on file  Other Topics Concern   . Not on file  Social History Narrative  . Not on file    Past Medical History, Surgical history, Social history, and Family history were reviewed and updated as appropriate.   Please see review of systems for further details on the patient's review from today.   Objective:   Physical Exam:  There were no vitals taken for this visit.  Physical Exam Neurological:     Mental Status: She is alert and oriented to person, place, and time.     Cranial Nerves: No dysarthria.  Psychiatric:        Attention and Perception: Attention normal.        Speech: Speech normal.        Behavior: Behavior is cooperative.        Thought Content: Thought content normal. Thought content is not paranoid or delusional. Thought content does not include homicidal or suicidal ideation. Thought content does not include homicidal or suicidal plan.        Cognition and Memory: Cognition and memory normal.        Judgment: Judgment normal.     Comments: Mood presents as less anxious and less depressed compared to previous exams. Insight intact.     Lab Review:     Component Value Date/Time   NA 139 11/12/2018 0520   K 3.4 (L) 11/12/2018 0520   CL 110 11/12/2018 0520   CO2 24 11/12/2018 0520   GLUCOSE 91 11/12/2018 0520   BUN <5 (L) 11/12/2018 0520   CREATININE 0.65 11/12/2018 0520   CALCIUM 8.0 (L) 11/12/2018 0520   PROT 7.2 03/03/2017 2132   ALBUMIN 4.3 03/03/2017 2132   AST 17 03/03/2017 2132   ALT 15 03/03/2017 2132   ALKPHOS 51 03/03/2017 2132   BILITOT 1.7 (H) 03/03/2017 2132   GFRNONAA >60 11/12/2018 0520   GFRAA >60 11/12/2018 0520       Component Value Date/Time   WBC 7.2 11/12/2018 0520   RBC 3.59 (L) 11/12/2018 0520   HGB 11.2 (L) 11/12/2018  0520   HCT 33.8 (L) 11/12/2018 0520   HCT 31.7 (L) 04/28/2012 0618   PLT 252 11/12/2018 0520   MCV 94.2 11/12/2018 0520   MCH 31.2 11/12/2018 0520   MCHC 33.1 11/12/2018 0520   RDW 13.4 11/12/2018 0520   LYMPHSABS 3.3 03/03/2017 2132    MONOABS 0.6 03/03/2017 2132   EOSABS 0.1 03/03/2017 2132   BASOSABS 0.1 03/03/2017 2132    No results found for: POCLITH, LITHIUM   No results found for: PHENYTOIN, PHENOBARB, VALPROATE, CBMZ   .res Assessment: Plan:   Patient seen for 30 minutes and greater than 50% of visit spent counseling patient and coordination of care to include discussing recent changes and response to Lyrica since this may also be affecting patient's mood, anxiety, and sleep.  Discussed continuing Trintellix 20 mg daily since this has been partially effective for her mood and anxiety.  Discussed changing administration time of Trintellix to taking with evening meal which may further improve GI side effects and also may improve fatigue and fogginess that patient reports may be caused by taking Trintellix during the daytime. Will continue Remeron 15 mg p.o. nightly for insomnia and depression. Agree with plan to start therapy with psychologist that specializes in pain management. Patient to follow-up in 4 to 6 weeks or sooner if clinically indicated. Patient advised to contact office with any questions, adverse effects, or acute worsening in signs and symptoms.  Feather was seen today for follow-up.  Diagnoses and all orders for this visit:  Moderate episode of recurrent major depressive disorder (HCC) -     vortioxetine HBr (TRINTELLIX) 20 MG TABS tablet; Take 1 tablet (20 mg total) by mouth daily.  Anxiety disorder, unspecified type -     vortioxetine HBr (TRINTELLIX) 20 MG TABS tablet; Take 1 tablet (20 mg total) by mouth daily.  Primary insomnia    Please see After Visit Summary for patient specific instructions.  Future Appointments  Date Time Provider Department Center  03/25/2019  8:30 AM Flora LippsGailes, Keeli T, PT [facility] None  04/01/2019 10:00 AM Flora LippsGailes, Keeli T, PT [facility] None  04/08/2019 10:00 AM Flora LippsGailes, Keeli T, PT [facility] None  04/15/2019 10:00 AM Flora LippsGailes, Keeli T, PT [facility] None   04/22/2019  9:30 AM Flora LippsGailes, Keeli T, PT [facility] None  04/22/2019 11:00 AM Corie Chiquitoarter, Candas Deemer, PMHNP [facility] None  04/29/2019  9:30 AM Flora LippsGailes, Keeli T, PT [facility] None  05/06/2019  9:30 AM Flora LippsGailes, Keeli T, PT [facility] None    No orders of the defined types were placed in this encounter.     -------------------------------

## 2019-03-25 ENCOUNTER — Ambulatory Visit: Payer: BC Managed Care – PPO

## 2019-03-27 ENCOUNTER — Ambulatory Visit: Payer: BC Managed Care – PPO | Attending: Gastroenterology

## 2019-03-27 ENCOUNTER — Other Ambulatory Visit: Payer: Self-pay

## 2019-03-27 DIAGNOSIS — R262 Difficulty in walking, not elsewhere classified: Secondary | ICD-10-CM | POA: Diagnosis present

## 2019-03-27 DIAGNOSIS — M6281 Muscle weakness (generalized): Secondary | ICD-10-CM | POA: Diagnosis present

## 2019-03-27 DIAGNOSIS — M62838 Other muscle spasm: Secondary | ICD-10-CM | POA: Diagnosis not present

## 2019-03-27 NOTE — Patient Instructions (Signed)
  When seated, you want to maintain pelvic neutral with the shoulders gently down and back and ears in line with your shoulders. A lumbar roll such as the one below or a home-made towel-roll can be used for this purpose. Even Olympic athletes can only maintain proper seated posture for about 10 minutes without support!    Pictured: The Original [employer] Lumbar Roll    Tuck your hips under, then keep the tuck as you lean forward so you feel a stretch through your low back. Hold for 5 deep breaths, repeat 2-3 times, 1-2 times per day.

## 2019-03-27 NOTE — Therapy (Signed)
[place] MAIN [facility] SERVICES [address] [place], [place], [address] Phone: [phone]   Fax:  [phone]  Physical Therapy Treatment  The patient has been informed of current processes in place at Outpatient Rehab to protect patients from Covid-19 exposure including social distancing, schedule modifications, and new cleaning procedures. After discussing their particular risk with a therapist based on the patient's personal risk factors, the patient has decided to proceed with in-person therapy.   Patient Details  Name: Christine Chavez MRN: [id number] Date of Birth: 25-Mar-1981 Referring Provider (PT): [name]   Encounter Date: 03/27/2019  PT End of Session - 03/27/19 1722    Visit Number  2    Number of Visits  20    Date for PT Re-Evaluation  05/21/19    Authorization Type  BCBS    Authorization Time Period  from 03/12/2019 through 05/21/2019    Authorization - Visit Number  2    Authorization - Number of Visits  20    PT Start Time  3557    PT Stop Time  1230    PT Time Calculation (min)  53 min    Activity Tolerance  No increased pain;Patient tolerated treatment well    Behavior During Therapy  [facility] for tasks assessed/performed       Past Medical History:  Diagnosis Date  . Complication of anesthesia    ? seizures after anesthesia   . Headache   . Migraines   . Neurogenic bladder   . Renal disorder   . Vision abnormalities     Past Surgical History:  Procedure Laterality Date  . ANTERIOR CRUCIATE LIGAMENT REPAIR  1997  . APPENDECTOMY    . COLONOSCOPY WITH PROPOFOL N/A 11/11/2018   Procedure: COLONOSCOPY WITH PROPOFOL;  Surgeon: [name], MD;  Location: [facility] ENDOSCOPY;  Service: Endoscopy;  Laterality: N/A;  . CYSTOSCOPY WITH STENT PLACEMENT Right 04/17/2016   Procedure: CYSTOSCOPY WITH STENT PLACEMENT;  Surgeon: [name], MD;  Location: [facility] ORS;  Service: Urology;  Laterality: Right;   . ESOPHAGOGASTRODUODENOSCOPY (EGD) WITH PROPOFOL N/A 11/11/2018   Procedure: ESOPHAGOGASTRODUODENOSCOPY (EGD) WITH PROPOFOL;  Surgeon: [name], MD;  Location: [facility] ENDOSCOPY;  Service: Endoscopy;  Laterality: N/A;  . EXPLORATORY LAPAROTOMY  1999  . KIDNEY STONE SURGERY  04/2016  . REVISION UROSTOMY CUTANEOUS    . REVISION UROSTOMY CUTANEOUS  01/10/2018  . SUPRAPUBIC CATHETER PLACEMENT  08/2017  . TONSILLECTOMY      There were no vitals filed for this visit.    Pelvic Floor Physical Therapy Treatment Note  SCREENING  Changes in medications, allergies, or medical history?: none    SUBJECTIVE  Patient reports: Lower pelvic pain radiating into R flank and urethral pain increased over last week, thinks she may have another infection.  Precautions:  Neurological diagnosis pending, has "non-specific" brain lesions   Pain update:  Location of pain: Upper pubic area and R flank Current pain:  5/10  Max pain:  9/10 Least pain:  2/10 Nature of pain: sharp to dull ache  Patient Goals: Get rid of the discomfort with catheterizing and ache/bladder pain. Be able to take fewer medications and be able to enjoy spending time with her family again, doing "all the mom things"    OBJECTIVE  Changes in: Range of Motion/Flexibilty:  Spine: ~ 2-3 fingers from knee with SB B. ~ 50% reduced R rotation with stretch in L, ~ 75% reduced L rotation with increased tightness.  Forward bend: 19 in.from floor. Hips: Unable to achieve neutral pelvis in long-sitting due to hamstring tightness.   Strength/MMT:  LE MMT  LE MMT Left Right  Hip flex:  (L2) /5 /5  Hip ext: 3+/5 3+/5  Hip abd: 4/5 4/5  Hip add: 4/5 3+/5  Hip IR 4/5 3+/5  Hip ER 4/5 4/5     Abdominal:  Palpation: TTP to R>L Iliacus, Psoas, and Pectineus Diastasis: not assessed   Posture/Observations:  L ASIS low, L knee bent, Posterior pelvic tilt, L PSIS high, R low Supine: RLE long in supine, does not revert upon  sitting upright (due to decreased hip and LB mobility not allowing Pt. To achieve neutral pelvic tilt in seated) R pubic ramus low. Prone: L paraspinals more prominent than R in prone, may be slight L c-curve scoliosis  Pelvic floor:  Abdominal:   Palpation: TTP to R>L lumbar erector spinae and multifidus, OI R Glute min, Piriformis  Gait Analysis:  INTERVENTIONS THIS SESSION: Manual: continued assessment of strength and ROM, Palpation then performed TP release to R pectineus and psoas then corrected pelvic alignment with MET correction and MWM for R anterior rotation to decrease spasm and pain and allow for improved pelvic alignment and balance of musculature for improved function and decreased symptoms. .  Theract: Educated on seated posture with use of a lumbar roll and not letting the knees fall to the center to prevent imbalance of musculature and low back stretch To maintain and improve muscle length and allow for improved balance of musculature for long-term symptom relief.   Total time: 42                             PT Short Term Goals - 03/16/19 1421      PT SHORT TERM GOAL #1   Title  Patient will demonstrate coordinated diaphragmatic breathing with pelvic tilts to demonstrate improved control of diaphragm and TA, to allow for further strengthening of core musculature and decreased pelvic floor spasm.    Baseline  Pt. demonstrates breathing dysfunction and poor PFM coordination evidenced by anal manometry    Time  5    Period  Weeks    Status  New    Target Date  04/20/19      PT SHORT TERM GOAL #2   Title  Patient will report a reduction in pain to no greater than 6/10 over the prior week to demonstrate symptom improvement.    Baseline  Pain is 10/10 at worst, 2/10 at best    Time  5    Period  Weeks    Status  New    Target Date  04/20/19      PT SHORT TERM GOAL #3   Title  Patient will demonstrate HEP x1 in the clinic to demonstrate  understanding and proper form to allow for further improvement.    Baseline  Pt. lacks knowledge of therepeutic exercises that can decrease her pain/Sx.    Time  5    Period  Weeks    Status  New    Target Date  04/20/19      PT SHORT TERM GOAL #4   Title  Patient will report consistent use of foot-stool (squatty-potty) for positioning with BM to decrease pain with BM and intra-abdominal pressure.    Baseline  Pt. having constipation due to PFM dysfunction    Time  5    Period  Weeks    Status  New    Target Date  04/20/19      PT SHORT TERM GOAL #5   Baseline  --        PT Long Term Goals - 03/16/19 1429      PT LONG TERM GOAL #1   Title  Pt. will be able to participate in regular ADL's with least restrictive device without pain increasing greater than 2/10    Baseline  Pt. limited in her ability to perform household duties by increased pain and fatigue.    Time  10    Period  Weeks    Status  New    Target Date  05/25/19      PT LONG TERM GOAL #2   Title  Patient will score at or below 65/300  on the PFDI and 35% on the Female NIH-CPSI to demonstrate a clinically meaningful decrease in disability and distress due to pelvic floor dysfunction.    Baseline  PFDI: 110/300, Female NIH-CPSI: 29/43 (67%)    Time  10    Period  Weeks    Status  New    Target Date  05/25/19      PT LONG TERM GOAL #3   Title  Patient will report no pain with intercourse to demonstrate improved functional ability.    Baseline  Pt. Having significant pain with intercourse    Time  10    Period  Weeks    Status  New    Target Date  05/25/19      PT LONG TERM GOAL #4   Title  Pt will report ability to work in her yard for greater than 30 minutes without extreme fatigue    Baseline  limited to 10-15 minutes before pt requires ending activity    Time  10    Period  Weeks    Status  New    Target Date  05/25/19      PT LONG TERM GOAL #5   Title  Pt. will be able to go 2-3 hours between  emptying her bladder without increased pain and empty her bladder fully whether by urostomy, cath, or voluntary release.    Baseline  Pt. has a urostomy but continues to have to self-catheterize. Has pain with bladder filling and when using catheter.    Time  10    Period  Weeks    Status  New    Target Date  05/25/19      Additional Long Term Goals   Additional Long Term Goals  Yes      PT LONG TERM GOAL #6   Title  Patient will report having BM's at least every-other day with consistency between [facility] stool scale 3-5 over the prior week to demonstrate decreased constipation.    Baseline  Pt. unable to have regula BM's without medication, manometry shows PFM dysfunction    Time  10    Period  Weeks    Status  New    Target Date  05/25/19            Plan - 03/27/19 1722    Clinical Impression Statement  Pt. Responded well to all interventions today, demonstrating improved pelvic alignment and decreased spasms as well as understanding and correct performance of education and exercises provided today. She will continue to benefit from skilled pelvic health therapy to work toward her goals.    Personal Factors and Comorbidities  Comorbidity 3+    Comorbidities  neurologic insult with pending diagnosis, multiple recurrent UTI's, BLE weakness, depression    Examination-Activity Limitations  Caring for Others;Carry;Continence;Squat;Stairs;Toileting;Locomotion Level;Lift;Stand    Examination-Participation Restrictions  Church;Cleaning;Community Activity;Driving;Interpersonal Relationship;Laundry;Yard Work;Meal Prep    Stability/Clinical Decision Making  Unstable/Unpredictable    Rehab Potential  Fair    PT Frequency  2x / week    PT Duration  Other (comment)    PT Treatment/Interventions  ADLs/Self Care Home Management;Aquatic Therapy;Biofeedback;Moist Heat;Electrical Stimulation;Cryotherapy;Traction;Ultrasound;Therapeutic activities;Functional mobility training;Stair training;Gait  training;Therapeutic exercise;Balance training;Neuromuscular re-education;Patient/family education;Manual techniques;Dry needling;Scar mobilization;Energy conservation;Taping;Joint Manipulations;Spinal Manipulations;Visual/perceptual remediation/compensation    PT Next Visit Plan  re-assess alignment in standing.DN to R low back and hip-flexors/adductors, give TENS for improving relaxation of the PFM and emptying? Assess internally and treat.    PT Home Exercise Plan  diaphragmatic breathing, seated posture (lumbar support and avoiding adduction, low back stretch    Consulted and Agree with Plan of Care  Patient       Patient will benefit from skilled therapeutic intervention in order to improve the following deficits and impairments:  Abnormal gait, Decreased balance, Decreased endurance, Decreased mobility, Difficulty walking, Increased muscle spasms, Impaired sensation, Improper body mechanics, Impaired tone, Decreased activity tolerance, Decreased coordination, Decreased strength, Postural dysfunction, Pain  Visit Diagnosis: Other muscle spasm  Muscle weakness (generalized)  Difficulty in walking, not elsewhere classified     Problem List Patient Active Problem List   Diagnosis Date Noted  . Seizure ([place]) 11/11/2018  . Major depressive disorder, recurrent episode, moderate ([place]) 02/07/2018  . Nephrolithiasis 04/16/2016  . Numbness 07/28/2015  . Bladder retention 06/23/2015  . Abdominal pain 06/04/2015  . Dizziness 05/18/2015  . Neck pain 05/18/2015  . Complicated migraine 73/95/8441  . Other fatigue 04/28/2015  . Abnormal finding on MRI of brain 04/28/2015  . D (diarrhea) 03/29/2015  . H/O disease 03/29/2015  . Abnormal weight loss 03/29/2015  . Muscle weakness (generalized) 03/14/2015  . Headache, migraine 03/10/2015   Willa Rough DPT, ATC Willa Rough 03/27/2019, 6:47 PM  [place] MAIN [facility] SERVICES [address]  [place], [place], [address] Phone: [phone]   Fax:  [phone]  Name: Linder Prajapati MRN: [id number] Date of Birth: [birth year]

## 2019-04-01 ENCOUNTER — Ambulatory Visit: Payer: BC Managed Care – PPO

## 2019-04-01 ENCOUNTER — Other Ambulatory Visit: Payer: Self-pay

## 2019-04-01 DIAGNOSIS — M62838 Other muscle spasm: Secondary | ICD-10-CM

## 2019-04-01 DIAGNOSIS — M6281 Muscle weakness (generalized): Secondary | ICD-10-CM

## 2019-04-01 DIAGNOSIS — R262 Difficulty in walking, not elsewhere classified: Secondary | ICD-10-CM

## 2019-04-01 NOTE — Therapy (Signed)
[place] MAIN [facility] SERVICES [address] [place], [place], [address] Phone: [phone]   Fax:  [phone]  Physical Therapy Treatment  The patient has been informed of current processes in place at Outpatient Rehab to protect patients from Covid-19 exposure including social distancing, schedule modifications, and new cleaning procedures. After discussing their particular risk with a therapist based on the patient's personal risk factors, the patient has decided to proceed with in-person therapy.   Patient Details  Name: Christine Chavez MRN: [id number] Date of Birth: [birth year] Referring Provider (PT): [name]   Encounter Date: 04/01/2019  PT End of Session - 04/01/19 1139    Visit Number  3    Number of Visits  20    Date for PT Re-Evaluation  05/21/19    Authorization Type  BCBS    Authorization Time Period  from 03/12/2019 through 05/21/2019    Authorization - Visit Number  3    Authorization - Number of Visits  20    PT Start Time  1000    PT Stop Time  1100    PT Time Calculation (min)  60 min    Activity Tolerance  No increased pain;Patient tolerated treatment well    Behavior During Therapy  [facility] for tasks assessed/performed       Past Medical History:  Diagnosis Date  . Complication of anesthesia    ? seizures after anesthesia   . Headache   . Migraines   . Neurogenic bladder   . Renal disorder   . Vision abnormalities     Past Surgical History:  Procedure Laterality Date  . ANTERIOR CRUCIATE LIGAMENT REPAIR  1997  . APPENDECTOMY    . COLONOSCOPY WITH PROPOFOL N/A 11/11/2018   Procedure: COLONOSCOPY WITH PROPOFOL;  Surgeon: [name], MD;  Location: [facility] ENDOSCOPY;  Service: Endoscopy;  Laterality: N/A;  . CYSTOSCOPY WITH STENT PLACEMENT Right 04/17/2016   Procedure: CYSTOSCOPY WITH STENT PLACEMENT;  Surgeon: [name], MD;  Location: [facility] ORS;  Service: Urology;  Laterality: Right;   . ESOPHAGOGASTRODUODENOSCOPY (EGD) WITH PROPOFOL N/A 11/11/2018   Procedure: ESOPHAGOGASTRODUODENOSCOPY (EGD) WITH PROPOFOL;  Surgeon: [name], MD;  Location: [facility] ENDOSCOPY;  Service: Endoscopy;  Laterality: N/A;  . EXPLORATORY LAPAROTOMY  1999  . KIDNEY STONE SURGERY  04/2016  . REVISION UROSTOMY CUTANEOUS    . REVISION UROSTOMY CUTANEOUS  01/10/2018  . SUPRAPUBIC CATHETER PLACEMENT  08/2017  . TONSILLECTOMY      There were no vitals filed for this visit.    Pelvic Floor Physical Therapy Treatment Note  SCREENING  Changes in medications, allergies, or medical history?: Has a bladder infection.   SUBJECTIVE  Patient reports: Not much change in her R flank pain. Had more spasms over the weekend.   Precautions:  Neurological diagnosis pending, has "non-specific" brain lesions   Pain update:  Location of pain: Upper pubic area and R flank Current pain:  4/10  Max pain:  9/10 Least pain:  2/10 Nature of pain: sharp to dull ache  Patient Goals: Get rid of the discomfort with catheterizing and ache/bladder pain. Be able to take fewer medications and be able to enjoy spending time with her family again, doing "all the mom things"    OBJECTIVE  Changes in: Range of Motion/Flexibilty:  (from 12/10) Spine: ~ 2-3 fingers from knee with SB B. ~ 50% reduced R rotation with stretch in L, ~ 75% reduced L rotation with increased tightness. Forward  bend: 19 in.from floor. Hips: Unable to achieve neutral pelvis in long-sitting due to hamstring tightness.   Strength/MMT:  LE MMT (from 12-10) LE MMT Left Right  Hip flex:  (L2) /5 /5  Hip ext: 3+/5 3+/5  Hip abd: 4/5 4/5  Hip add: 4/5 3+/5  Hip IR 4/5 3+/5  Hip ER 4/5 4/5   Posture/Observations:  (from 12-10) L ASIS low, L knee bent, Posterior pelvic tilt, L PSIS high, R low Supine: RLE long in supine, does not revert upon sitting upright (due to decreased hip and LB mobility not allowing Pt. To achieve neutral  pelvic tilt in seated) R pubic ramus low. Prone: L paraspinals more prominent than R in prone, may be slight L c-curve scoliosis  Pelvic floor:  Palpation: TTP to R>L lumbar erector spinae and multifidus, OI R Glute min, Piriformis, adductors and hip-flexors (from 12-10)  Today Pt. TTP at L QL, R Iliacus (less to R pectineus)  Gait Analysis:  INTERVENTIONS THIS SESSION: Manual: Performed TP release to R Iliacus and L QL then performed MET correction for R anterior rotation to decrease spasm and pain and allow for improved pelvic alignment and balance of musculature for improved function and decreased symptoms. .  Therex: Educated on and practiced side stretch and seated HS stretch B To maintain and improve muscle length and allow for improved balance of musculature for long-term symptom relief.   Total time: 60                                PT Short Term Goals - 03/16/19 1421      PT SHORT TERM GOAL #1   Title  Patient will demonstrate coordinated diaphragmatic breathing with pelvic tilts to demonstrate improved control of diaphragm and TA, to allow for further strengthening of core musculature and decreased pelvic floor spasm.    Baseline  Pt. demonstrates breathing dysfunction and poor PFM coordination evidenced by anal manometry    Time  5    Period  Weeks    Status  New    Target Date  04/20/19      PT SHORT TERM GOAL #2   Title  Patient will report a reduction in pain to no greater than 6/10 over the prior week to demonstrate symptom improvement.    Baseline  Pain is 10/10 at worst, 2/10 at best    Time  5    Period  Weeks    Status  New    Target Date  04/20/19      PT SHORT TERM GOAL #3   Title  Patient will demonstrate HEP x1 in the clinic to demonstrate understanding and proper form to allow for further improvement.    Baseline  Pt. lacks knowledge of therepeutic exercises that can decrease her pain/Sx.    Time  5    Period  Weeks     Status  New    Target Date  04/20/19      PT SHORT TERM GOAL #4   Title  Patient will report consistent use of foot-stool (squatty-potty) for positioning with BM to decrease pain with BM and intra-abdominal pressure.    Baseline  Pt. having constipation due to PFM dysfunction    Time  5    Period  Weeks    Status  New    Target Date  04/20/19      PT SHORT TERM GOAL #5  Baseline  --        PT Long Term Goals - 03/16/19 1429      PT LONG TERM GOAL #1   Title  Pt. will be able to participate in regular ADL's with least restrictive device without pain increasing greater than 2/10    Baseline  Pt. limited in her ability to perform household duties by increased pain and fatigue.    Time  10    Period  Weeks    Status  New    Target Date  05/25/19      PT LONG TERM GOAL #2   Title  Patient will score at or below 65/300  on the PFDI and 35% on the Female NIH-CPSI to demonstrate a clinically meaningful decrease in disability and distress due to pelvic floor dysfunction.    Baseline  PFDI: 110/300, Female NIH-CPSI: 29/43 (67%)    Time  10    Period  Weeks    Status  New    Target Date  05/25/19      PT LONG TERM GOAL #3   Title  Patient will report no pain with intercourse to demonstrate improved functional ability.    Baseline  Pt. Having significant pain with intercourse    Time  10    Period  Weeks    Status  New    Target Date  05/25/19      PT LONG TERM GOAL #4   Title  Pt will report ability to work in her yard for greater than 30 minutes without extreme fatigue    Baseline  limited to 10-15 minutes before pt requires ending activity    Time  10    Period  Weeks    Status  New    Target Date  05/25/19      PT LONG TERM GOAL #5   Title  Pt. will be able to go 2-3 hours between emptying her bladder without increased pain and empty her bladder fully whether by urostomy, cath, or voluntary release.    Baseline  Pt. has a urostomy but continues to have to  self-catheterize. Has pain with bladder filling and when using catheter.    Time  10    Period  Weeks    Status  New    Target Date  05/25/19      Additional Long Term Goals   Additional Long Term Goals  Yes      PT LONG TERM GOAL #6   Title  Patient will report having BM's at least every-other day with consistency between [facility] stool scale 3-5 over the prior week to demonstrate decreased constipation.    Baseline  Pt. unable to have regula BM's without medication, manometry shows PFM dysfunction    Time  10    Period  Weeks    Status  New    Target Date  05/25/19            Plan - 04/01/19 1140    Clinical Impression Statement  Pt. Responded well to all interventions today, demonstrating improved pelvic alignment and decreased spasm as well as understanding and correct performance of all education and exercises provided today. They will continue to benefit from skilled physical therapy to work toward remaining goals and maximize function as well as decrease likelihood of symptom increase or recurrence.     PT Next Visit Plan  re-assess alignment in standing.DN to R low back and hip-flexors/adductors, give TENS for improving relaxation of the PFM  and emptying? Assess internally and treat.    PT Home Exercise Plan  diaphragmatic breathing, seated posture (lumbar support and avoiding adduction, low back stretch, side-stretch, hamstring stretch    Consulted and Agree with Plan of Care  Patient       Patient will benefit from skilled therapeutic intervention in order to improve the following deficits and impairments:     Visit Diagnosis: Other muscle spasm  Muscle weakness (generalized)  Difficulty in walking, not elsewhere classified     Problem List Patient Active Problem List   Diagnosis Date Noted  . Seizure ([place]) 11/11/2018  . Major depressive disorder, recurrent episode, moderate ([place]) 02/07/2018  . Nephrolithiasis 04/16/2016  . Numbness 07/28/2015  . Bladder  retention 06/23/2015  . Abdominal pain 06/04/2015  . Dizziness 05/18/2015  . Neck pain 05/18/2015  . Complicated migraine 75/73/2256  . Other fatigue 04/28/2015  . Abnormal finding on MRI of brain 04/28/2015  . D (diarrhea) 03/29/2015  . H/O disease 03/29/2015  . Abnormal weight loss 03/29/2015  . Muscle weakness (generalized) 03/14/2015  . Headache, migraine 03/10/2015   Willa Rough DPT, ATC Willa Rough 04/01/2019, 11:48 AM  [place] MAIN [facility] SERVICES [address] [place], [place], [address] Phone: [phone]   Fax:  [phone]  Name: Christine Chavez MRN: [id number] Date of Birth: 04-03-1981

## 2019-04-01 NOTE — Patient Instructions (Signed)
   Hold for 30 seconds (5 deep breaths) and repeat 2-3 times on each side once a day  Seated -in chair hamstring stretch    Perform 3 sets of 5 belly breaths to both sides. Repeat 1-3/day   Action: Stick on leg out, with toes pointing up towards your nose and knee straight. With abdominals engaged, sitting up right, gently lean forward till you feel a gentle stretch at the back of your leg.

## 2019-04-03 ENCOUNTER — Other Ambulatory Visit: Payer: Self-pay

## 2019-04-03 ENCOUNTER — Ambulatory Visit: Payer: BC Managed Care – PPO

## 2019-04-03 DIAGNOSIS — R262 Difficulty in walking, not elsewhere classified: Secondary | ICD-10-CM

## 2019-04-03 DIAGNOSIS — M6281 Muscle weakness (generalized): Secondary | ICD-10-CM

## 2019-04-03 DIAGNOSIS — M62838 Other muscle spasm: Secondary | ICD-10-CM | POA: Diagnosis not present

## 2019-04-03 NOTE — Patient Instructions (Signed)
   Bring feet together and let the knees fall out to the sides. Hold for 5 deep breaths, rest then repeat 2 more times.     Let the top arm rest on your side, and slide along the torso as you rotate. Breathe in as you come forward, out as you open up. Do 2x15 on each side.

## 2019-04-03 NOTE — Therapy (Addendum)
[place] MAIN [facility] SERVICES [address] [place], [place], [address] Phone: [phone]   Fax:  [phone]  Physical Therapy Treatment  The patient has been informed of current processes in place at Outpatient Rehab to protect patients from Covid-19 exposure including social distancing, schedule modifications, and new cleaning procedures. After discussing their particular risk with a therapist based on the patient's personal risk factors, the patient has decided to proceed with in-person therapy.   Patient Details  Name: Christine Chavez MRN: [id number] Date of Birth: January 19, 1981 Referring Provider (PT): [name]   Encounter Date: 04/03/2019  PT End of Session - 04/03/19 1441    Visit Number  4    Number of Visits  20    Date for PT Re-Evaluation  05/21/19    Authorization Type  [employer]    Authorization Time Period  from 03/12/2019 through 05/21/2019    Authorization - Visit Number  4    Authorization - Number of Visits  20    PT Start Time  1300    PT Stop Time  1400    PT Time Calculation (min)  60 min    Activity Tolerance  No increased pain;Patient tolerated treatment well    Behavior During Therapy  [facility] for tasks assessed/performed       Past Medical History:  Diagnosis Date  . Complication of anesthesia    ? seizures after anesthesia   . Headache   . Migraines   . Neurogenic bladder   . Renal disorder   . Vision abnormalities     Past Surgical History:  Procedure Laterality Date  . ANTERIOR CRUCIATE LIGAMENT REPAIR  1997  . APPENDECTOMY    . COLONOSCOPY WITH PROPOFOL N/A 11/11/2018   Procedure: COLONOSCOPY WITH PROPOFOL;  Surgeon: [name], MD;  Location: [facility] ENDOSCOPY;  Service: Endoscopy;  Laterality: N/A;  . CYSTOSCOPY WITH STENT PLACEMENT Right 04/17/2016   Procedure: CYSTOSCOPY WITH STENT PLACEMENT;  Surgeon: [name], MD;  Location: [facility] ORS;  Service: Urology;  Laterality: Right;   . ESOPHAGOGASTRODUODENOSCOPY (EGD) WITH PROPOFOL N/A 11/11/2018   Procedure: ESOPHAGOGASTRODUODENOSCOPY (EGD) WITH PROPOFOL;  Surgeon: [name], MD;  Location: [facility] ENDOSCOPY;  Service: Endoscopy;  Laterality: N/A;  . EXPLORATORY LAPAROTOMY  1999  . KIDNEY STONE SURGERY  04/2016  . REVISION UROSTOMY CUTANEOUS    . REVISION UROSTOMY CUTANEOUS  01/10/2018  . SUPRAPUBIC CATHETER PLACEMENT  08/2017  . TONSILLECTOMY      There were no vitals filed for this visit.     Pelvic Floor Physical Therapy Treatment Note  SCREENING  Changes in medications, allergies, or medical history?: Waiting to see if she needs to keep taking an antibiotic because she is still feeling bad.   SUBJECTIVE  Patient reports: Most of her flare ups were in the bladder/pelvic area not as much in the hip since the last visit.  Precautions:  Neurological diagnosis pending, has "non-specific" brain lesions   Pain update:  Location of pain: Upper pubic area and R flank Current pain:  4/10  Max pain:  5/10 Least pain:  2/10 Nature of pain: sharp to dull ache  Patient Goals: Get rid of the discomfort with catheterizing and ache/bladder pain. Be able to take fewer medications and be able to enjoy spending time with her family again, doing "all the mom things"    OBJECTIVE  Changes in: Range of Motion/Flexibilty:  (from 12/10) Spine: ~ 2-3 fingers from knee with SB  B. ~ 50% reduced R rotation with stretch in L, ~ 75% reduced L rotation with increased tightness. Forward bend: 19 in.from floor. Hips: Unable to achieve neutral pelvis in long-sitting due to hamstring tightness.   Strength/MMT:  LE MMT (from 12-10) LE MMT Left Right  Hip flex:  (L2) /5 /5  Hip ext: 3+/5 3+/5  Hip abd: 4/5 4/5  Hip add: 4/5 3+/5  Hip IR 4/5 3+/5  Hip ER 4/5 4/5   Posture/Observations:  In standing, R PSIS just (very) slightly high pre-treatment.  Pelvic floor:  Palpation: TTP to R>L lumbar erector spinae  and multifidus, OI R Glute min, Piriformis, adductors and hip-flexors (from 12-10)  Today Pt. TTP at R QL, L Iliacus, B pectineus, R lumbar multifidus and Erectors  Gait Analysis:  INTERVENTIONS THIS SESSION: Manual: Performed TP release to R QL and Erector spinae and performed grade 3-4 PA mobs through T12-L3 to improve spinal mobility, decrease spasm and pain, and allow for improved pelvic alignment and balance of musculature for improved function and decreased symptoms.  Dry-needle: Performed TPDN with a .75mm needle and standard approach as described below to decrease spasm and pain and allow for improved balance of musculature for improved function and decreased symptoms.  Therex: reviewed and practiced side-stretch and educated on and practiced supine butterfly stretch To maintain and improve muscle length and allow for improved balance of musculature for long-term symptom relief. Educated on and practiced bow-and-arrow rotations to improve mobility of joint and surrounding connective tissue and decrease pressure on nerve roots for improved conductivity and function of down-stream tissues.     Total time: 60                     Trigger Point Dry Needling - 04/03/19 0001    Consent Given?  Yes    Education Handout Provided  No    Muscles Treated Back/Hip  Erector spinae;Quadratus lumborum    Erector spinae Response  Twitch response elicited;Palpable increased muscle length    Quadratus Lumborum Response  Twitch response elicited;Palpable increased muscle length             PT Short Term Goals - 03/16/19 1421      PT SHORT TERM GOAL #1   Title  Patient will demonstrate coordinated diaphragmatic breathing with pelvic tilts to demonstrate improved control of diaphragm and TA, to allow for further strengthening of core musculature and decreased pelvic floor spasm.    Baseline  Pt. demonstrates breathing dysfunction and poor PFM coordination evidenced by anal  manometry    Time  5    Period  Weeks    Status  New    Target Date  04/20/19      PT SHORT TERM GOAL #2   Title  Patient will report a reduction in pain to no greater than 6/10 over the prior week to demonstrate symptom improvement.    Baseline  Pain is 10/10 at worst, 2/10 at best    Time  5    Period  Weeks    Status  New    Target Date  04/20/19      PT SHORT TERM GOAL #3   Title  Patient will demonstrate HEP x1 in the clinic to demonstrate understanding and proper form to allow for further improvement.    Baseline  Pt. lacks knowledge of therepeutic exercises that can decrease her pain/Sx.    Time  5    Period  Weeks  Status  New    Target Date  04/20/19      PT SHORT TERM GOAL #4   Title  Patient will report consistent use of foot-stool (squatty-potty) for positioning with BM to decrease pain with BM and intra-abdominal pressure.    Baseline  Pt. having constipation due to PFM dysfunction    Time  5    Period  Weeks    Status  New    Target Date  04/20/19      PT SHORT TERM GOAL #5   Baseline  --        PT Long Term Goals - 03/16/19 1429      PT LONG TERM GOAL #1   Title  Pt. will be able to participate in regular ADL's with least restrictive device without pain increasing greater than 2/10    Baseline  Pt. limited in her ability to perform household duties by increased pain and fatigue.    Time  10    Period  Weeks    Status  New    Target Date  05/25/19      PT LONG TERM GOAL #2   Title  Patient will score at or below 65/300  on the PFDI and 35% on the Female NIH-CPSI to demonstrate a clinically meaningful decrease in disability and distress due to pelvic floor dysfunction.    Baseline  PFDI: 110/300, Female NIH-CPSI: 29/43 (67%)    Time  10    Period  Weeks    Status  New    Target Date  05/25/19      PT LONG TERM GOAL #3   Title  Patient will report no pain with intercourse to demonstrate improved functional ability.    Baseline  Pt. Having  significant pain with intercourse    Time  10    Period  Weeks    Status  New    Target Date  05/25/19      PT LONG TERM GOAL #4   Title  Pt will report ability to work in her yard for greater than 30 minutes without extreme fatigue    Baseline  limited to 10-15 minutes before pt requires ending activity    Time  10    Period  Weeks    Status  New    Target Date  05/25/19      PT LONG TERM GOAL #5   Title  Pt. will be able to go 2-3 hours between emptying her bladder without increased pain and empty her bladder fully whether by urostomy, cath, or voluntary release.    Baseline  Pt. has a urostomy but continues to have to self-catheterize. Has pain with bladder filling and when using catheter.    Time  10    Period  Weeks    Status  New    Target Date  05/25/19      Additional Long Term Goals   Additional Long Term Goals  Yes      PT LONG TERM GOAL #6   Title  Patient will report having BM's at least every-other day with consistency between [facility] stool scale 3-5 over the prior week to demonstrate decreased constipation.    Baseline  Pt. unable to have regula BM's without medication, manometry shows PFM dysfunction    Time  10    Period  Weeks    Status  New    Target Date  05/25/19  Plan - 04/03/19 1441    Clinical Impression Statement  Pt. Responded well to all interventions today, demonstrating decreased spasms, improved L side-bending and R rotation ROM as well as understanding and correct performance of all education and exercises provided today. They will continue to benefit from skilled physical therapy to work toward remaining goals and maximize function as well as decrease likelihood of symptom increase or recurrence.     PT Next Visit Plan  re-assess alignment in standing.DN to R multifidus, L hip-flexors, B adductors, give TENS for improving relaxation of the PFM and emptying? Assess internally and treat.    PT Home Exercise Plan  diaphragmatic  breathing, seated posture (lumbar support and avoiding adduction, low back stretch, side-stretch, hamstring stretch    Consulted and Agree with Plan of Care  Patient       Patient will benefit from skilled therapeutic intervention in order to improve the following deficits and impairments:     Visit Diagnosis: Other muscle spasm  Muscle weakness (generalized)  Difficulty in walking, not elsewhere classified     Problem List Patient Active Problem List   Diagnosis Date Noted  . Seizure (HCC) 11/11/2018  . Major depressive disorder, recurrent episode, moderate (HCC) 02/07/2018  . Nephrolithiasis 04/16/2016  . Numbness 07/28/2015  . Bladder retention 06/23/2015  . Abdominal pain 06/04/2015  . Dizziness 05/18/2015  . Neck pain 05/18/2015  . Complicated migraine 04/28/2015  . Other fatigue 04/28/2015  . Abnormal finding on MRI of brain 04/28/2015  . D (diarrhea) 03/29/2015  . H/O disease 03/29/2015  . Abnormal weight loss 03/29/2015  . Muscle weakness (generalized) 03/14/2015  . Headache, migraine 03/10/2015   Cleophus Molt DPT, ATC Cleophus Molt 04/03/2019, 2:57 PM   [facility] MAIN [facility] SERVICES [address] [place], [place], [address] Phone: [phone]   Fax:  [phone]  Name: Christine Chavez MRN: [id number] Date of Birth: 12-08-1980

## 2019-04-08 ENCOUNTER — Ambulatory Visit: Payer: BC Managed Care – PPO

## 2019-04-15 ENCOUNTER — Ambulatory Visit: Payer: BC Managed Care – PPO

## 2019-04-15 ENCOUNTER — Other Ambulatory Visit: Payer: Self-pay

## 2019-04-15 DIAGNOSIS — M62838 Other muscle spasm: Secondary | ICD-10-CM

## 2019-04-15 DIAGNOSIS — R262 Difficulty in walking, not elsewhere classified: Secondary | ICD-10-CM

## 2019-04-15 DIAGNOSIS — M6281 Muscle weakness (generalized): Secondary | ICD-10-CM

## 2019-04-15 NOTE — Therapy (Signed)
[place] [facility] MAIN [facility] [address] [place], [place], [address] Phone: [phone]   Fax:  [phone]  Physical Therapy Treatment  The patient has been informed of current processes in place at Outpatient Rehab to protect patients from Covid-19 exposure including social distancing, schedule modifications, and new cleaning procedures. After discussing their particular risk with a therapist based on the patient's personal risk factors, the patient has decided to proceed with in-person therapy.   Patient Details  Name: Christine Chavez MRN: [id number] Date of Birth: [birth year] Referring Provider (PT): Lexine BatonLondon, Hartman [name]   Encounter Date: 04/15/2019    Past Medical History:  Diagnosis Date  . Complication of anesthesia    ? seizures after anesthesia   . Headache   . Migraines   . Neurogenic bladder   . Renal disorder   . Vision abnormalities     Past Surgical History:  Procedure Laterality Date  . ANTERIOR CRUCIATE LIGAMENT REPAIR  1997  . APPENDECTOMY    . COLONOSCOPY WITH PROPOFOL N/A 11/11/2018   Procedure: COLONOSCOPY WITH PROPOFOL;  Surgeon: Christena DeemSkulskie, Martin U, MD;  [facility];  Service: Endoscopy;  Laterality: N/A;  . CYSTOSCOPY WITH STENT PLACEMENT Right 04/17/2016   Procedure: CYSTOSCOPY WITH STENT PLACEMENT;  Surgeon: Malen GauzePatrick L McKenzie, MD;  Location: [facility] ORS;  Service: Urology;  Laterality: Right;  . ESOPHAGOGASTRODUODENOSCOPY (EGD) WITH PROPOFOL N/A 11/11/2018   Procedure: ESOPHAGOGASTRODUODENOSCOPY (EGD) WITH PROPOFOL;  Surgeon: Christena DeemSkulskie, Martin U, MD;  [facility];  Service: Endoscopy;  Laterality: N/A;  . EXPLORATORY LAPAROTOMY  1999  . KIDNEY STONE SURGERY  04/2016  . REVISION UROSTOMY CUTANEOUS    . REVISION UROSTOMY CUTANEOUS  01/10/2018  . SUPRAPUBIC CATHETER PLACEMENT  08/2017  . TONSILLECTOMY      There were no vitals filed for this visit.     Pelvic Floor Physical  Therapy Treatment Note  SCREENING  Changes in medications, allergies, or medical history?: Waiting to see if she needs to keep taking an antibiotic because she is still feeling bad.   SUBJECTIVE  Patient reports: Thinks she still may have an infection, the last two days were a little better but today she is having some spasms and her R side is bothering her again. Was nauseated and had high pain for a few days last week, that is why she cancelled. Was having less tension after initial soreness from Dry needling but then was not able to be very active and now has return of tightness.  Precautions:  Neurological diagnosis pending, has "non-specific" brain lesions   Pain update:  Location of pain: R QL/ kidney area and near urethra. Current pain:  3/10  Max pain:  9/10 (during flare from infection) Least pain:  2/10 Nature of pain: sharp to dull ache  **  Patient Goals: Get rid of the discomfort with catheterizing and ache/bladder pain. Be able to take fewer medications and be able to enjoy spending time with her family again, doing "all the mom things"    OBJECTIVE  Changes in: Range of Motion/Flexibilty:  (from 12/10) Spine: ~ 2-3 fingers from knee with SB B. ~ 50% reduced R rotation with stretch in L, ~ 75% reduced L rotation with increased tightness. Forward bend: 19 in.from floor. Hips: Unable to achieve neutral pelvis in long-sitting due to hamstring tightness.   Strength/MMT:  LE MMT (from 12-10) LE MMT Left Right  Hip flex:  (L2) /5 /5  Hip ext: 3+/5 3+/5  Hip abd:  4/5 4/5  Hip add: 4/5 3+/5  Hip IR 4/5 3+/5  Hip ER 4/5 4/5   Posture/Observations:    Pelvic floor:  Palpation: Today: TTP to R QL, lumbar erector spinae at ~T12 and multifidus through T10-L1  TTP to OI R Glute min, Piriformis, adductors and hip-flexors (from 12-10)   Gait Analysis:  INTERVENTIONS THIS SESSION: Manual: Performed TP release toR QL, lumbar erector spinae at ~T12 and  multifidus through T10-L1 to decrease spasm and pain, and allow for improved pelvic alignment and balance of musculature for improved function and decreased symptoms.  Dry-needle: Performed TPDN with a .30x18mm needle and standard approach as described below to decrease spasm and pain and allow for improved balance of musculature for improved function and decreased symptoms.    Total time: 60                      Trigger Point Dry Needling - 04/15/19 0001    Consent Given?  Yes    Education Handout Provided  No    Muscles Treated Back/Hip  Erector spinae;Lumbar multifidi;Quadratus lumborum    Other Dry Needling  Right    Erector spinae Response  Twitch response elicited;Palpable increased muscle length    Lumbar multifidi Response  Twitch response elicited;Palpable increased muscle length    Quadratus Lumborum Response  Twitch response elicited;Palpable increased muscle length             PT Short Term Goals - 03/16/19 1421      PT SHORT TERM GOAL #1   Title  Patient will demonstrate coordinated diaphragmatic breathing with pelvic tilts to demonstrate improved control of diaphragm and TA, to allow for further strengthening of core musculature and decreased pelvic floor spasm.    Baseline  Pt. demonstrates breathing dysfunction and poor PFM coordination evidenced by anal manometry    Time  5    Period  Weeks    Status  New    Target Date  04/20/19      PT SHORT TERM GOAL #2   Title  Patient will report a reduction in pain to no greater than 6/10 over the prior week to demonstrate symptom improvement.    Baseline  Pain is 10/10 at worst, 2/10 at best    Time  5    Period  Weeks    Status  New    Target Date  04/20/19      PT SHORT TERM GOAL #3   Title  Patient will demonstrate HEP x1 in the clinic to demonstrate understanding and proper form to allow for further improvement.    Baseline  Pt. lacks knowledge of therepeutic exercises that can decrease her  pain/Sx.    Time  5    Period  Weeks    Status  New    Target Date  04/20/19      PT SHORT TERM GOAL #4   Title  Patient will report consistent use of foot-stool (squatty-potty) for positioning with BM to decrease pain with BM and intra-abdominal pressure.    Baseline  Pt. having constipation due to PFM dysfunction    Time  5    Period  Weeks    Status  New    Target Date  04/20/19      PT SHORT TERM GOAL #5   Baseline  --        PT Long Term Goals - 03/16/19 1429      PT LONG TERM GOAL #1  Title  Pt. will be able to participate in regular ADL's with least restrictive device without pain increasing greater than 2/10    Baseline  Pt. limited in her ability to perform household duties by increased pain and fatigue.    Time  10    Period  Weeks    Status  New    Target Date  05/25/19      PT LONG TERM GOAL #2   Title  Patient will score at or below 65/300  on the PFDI and 35% on the Female NIH-CPSI to demonstrate a clinically meaningful decrease in disability and distress due to pelvic floor dysfunction.    Baseline  PFDI: 110/300, Female NIH-CPSI: 29/43 (67%)    Time  10    Period  Weeks    Status  New    Target Date  05/25/19      PT LONG TERM GOAL #3   Title  Patient will report no pain with intercourse to demonstrate improved functional ability.    Baseline  Pt. Having significant pain with intercourse    Time  10    Period  Weeks    Status  New    Target Date  05/25/19      PT LONG TERM GOAL #4   Title  Pt will report ability to work in her yard for greater than 30 minutes without extreme fatigue    Baseline  limited to 10-15 minutes before pt requires ending activity    Time  10    Period  Weeks    Status  New    Target Date  05/25/19      PT LONG TERM GOAL #5   Title  Pt. will be able to go 2-3 hours between emptying her bladder without increased pain and empty her bladder fully whether by urostomy, cath, or voluntary release.    Baseline  Pt. has a  urostomy but continues to have to self-catheterize. Has pain with bladder filling and when using catheter.    Time  10    Period  Weeks    Status  New    Target Date  05/25/19      Additional Long Term Goals   Additional Long Term Goals  Yes      PT LONG TERM GOAL #6   Title  Patient will report having BM's at least every-other day with consistency between [facility] stool scale 3-5 over the prior week to demonstrate decreased constipation.    Baseline  Pt. unable to have regula BM's without medication, manometry shows PFM dysfunction    Time  10    Period  Weeks    Status  New    Target Date  05/25/19            Plan - 04/15/19 1054    Clinical Impression Statement  Pt. Responded well to all interventions today, demonstrating improved spasms and TTP through R mid to low back as well as understanding and correct performance of all education and exercises provided today. They will continue to benefit from skilled physical therapy to work toward remaining goals and maximize function as well as decrease likelihood of symptom increase or recurrence.     PT Next Visit Plan  re-assess alignment in standing.DN to L hip-flexors, B adductors, give TENS for improving relaxation of the PFM and emptying? Assess internally and treat.    PT Home Exercise Plan  diaphragmatic breathing, seated posture (lumbar support and avoiding adduction, low back stretch,  side-stretch, hamstring stretch    Consulted and Agree with Plan of Care  Patient       Patient will benefit from skilled therapeutic intervention in order to improve the following deficits and impairments:     Visit Diagnosis: Other muscle spasm  Muscle weakness (generalized)  Difficulty in walking, not elsewhere classified     Problem List Patient Active Problem List   Diagnosis Date Noted  . Seizure (HCC) 11/11/2018  . Major depressive disorder, recurrent episode, moderate (HCC) 02/07/2018  . Nephrolithiasis 04/16/2016  .  Numbness 07/28/2015  . Bladder retention 06/23/2015  . Abdominal pain 06/04/2015  . Dizziness 05/18/2015  . Neck pain 05/18/2015  . Complicated migraine 04/28/2015  . Other fatigue 04/28/2015  . Abnormal finding on MRI of brain 04/28/2015  . D (diarrhea) 03/29/2015  . H/O disease 03/29/2015  . Abnormal weight loss 03/29/2015  . Muscle weakness (generalized) 03/14/2015  . Headache, migraine 03/10/2015   Cleophus Molt DPT, ATC Cleophus Molt 04/15/2019, 10:55 AM  [place] [facility] MAIN [facility] SERVICES [address] [place], [place], [address] Phone: [phone]   Fax:  [phone]  Name: Christine Chavez MRN: [id number] Date of Birth: 07/18/1980

## 2019-04-17 ENCOUNTER — Other Ambulatory Visit: Payer: Self-pay

## 2019-04-17 ENCOUNTER — Ambulatory Visit: Payer: BC Managed Care – PPO

## 2019-04-17 DIAGNOSIS — M6281 Muscle weakness (generalized): Secondary | ICD-10-CM

## 2019-04-17 DIAGNOSIS — M62838 Other muscle spasm: Secondary | ICD-10-CM

## 2019-04-17 DIAGNOSIS — R262 Difficulty in walking, not elsewhere classified: Secondary | ICD-10-CM

## 2019-04-17 NOTE — Therapy (Signed)
[facility] MAIN [facility] SERVICES [address] [place], [place], [address] Phone: [phone]   Fax:  [phone]  Physical Therapy Treatment  The patient has been informed of current processes in place at Outpatient Rehab to protect patients from Covid-19 exposure including social distancing, schedule modifications, and new cleaning procedures. After discussing their particular risk with a therapist based on the patient's personal risk factors, the patient has decided to proceed with in-person therapy.   Patient Details  Name: Christine Chavez MRN: [id number] Date of Birth: 09-Feb-1981 Referring Provider (PT): [name]   Encounter Date: 04/17/2019  PT End of Session - 04/17/19 1504    Visit Number  6    Number of Visits  20    Date for PT Re-Evaluation  05/21/19    Authorization Type  BCBS    Authorization Time Period  from 03/12/2019 through 05/21/2019    Authorization - Visit Number  6    Authorization - Number of Visits  20    PT Start Time  1300    PT Stop Time  1415    PT Time Calculation (min)  75 min    Activity Tolerance  No increased pain;Patient tolerated treatment well    Behavior During Therapy  [facility] for tasks assessed/performed       Past Medical History:  Diagnosis Date  . Complication of anesthesia    ? seizures after anesthesia   . Headache   . Migraines   . Neurogenic bladder   . Renal disorder   . Vision abnormalities     Past Surgical History:  Procedure Laterality Date  . ANTERIOR CRUCIATE LIGAMENT REPAIR  1997  . APPENDECTOMY    . COLONOSCOPY WITH PROPOFOL N/A 11/11/2018   Procedure: COLONOSCOPY WITH PROPOFOL;  Surgeon: Christena Deem, MD;  Location: [facility] ENDOSCOPY;  Service: Endoscopy;  Laterality: N/A;  . CYSTOSCOPY WITH STENT PLACEMENT Right 04/17/2016   Procedure: CYSTOSCOPY WITH STENT PLACEMENT;  Surgeon: Malen Gauze, MD;  Location: [facility] ORS;  Service: Urology;  Laterality: Right;   . ESOPHAGOGASTRODUODENOSCOPY (EGD) WITH PROPOFOL N/A 11/11/2018   Procedure: ESOPHAGOGASTRODUODENOSCOPY (EGD) WITH PROPOFOL;  Surgeon: Christena Deem, MD;  Location: [facility] ENDOSCOPY;  Service: Endoscopy;  Laterality: N/A;  . EXPLORATORY LAPAROTOMY  1999  . KIDNEY STONE SURGERY  04/2016  . REVISION UROSTOMY CUTANEOUS    . REVISION UROSTOMY CUTANEOUS  01/10/2018  . SUPRAPUBIC CATHETER PLACEMENT  08/2017  . TONSILLECTOMY      There were no vitals filed for this visit.    Pelvic Floor Physical Therapy Treatment Note  SCREENING  Changes in medications, allergies, or medical history?: Waiting to see if she needs to keep taking an antibiotic because she is still feeling bad.   SUBJECTIVE  Patient reports: Is still having pain in the R QL area when she "moves a certain way" is having quite a bit of bladder spasms. Can even see her stoma pulsing.  Precautions:  Neurological diagnosis pending, has "non-specific" brain lesions   Pain update:  Location of pain: R QL/ kidney area and near urethra. Current pain:  3/10  Max pain:  7/10 (during flare from infection) Least pain:  1/10 Nature of pain: sharp to dull ache   Patient Goals: Get rid of the discomfort with catheterizing and ache/bladder pain. Be able to take fewer medications and be able to enjoy spending time with her family again, doing "all the mom things"    OBJECTIVE  Changes in:  Range of Motion/Flexibilty:  (from 12/10) Spine: ~ 2-3 fingers from knee with SB B. ~ 50% reduced R rotation with stretch in L, ~ 75% reduced L rotation with increased tightness. Forward bend: 19 in.from floor. Hips: Unable to achieve neutral pelvis in long-sitting due to hamstring tightness.  Today: re-creation of "pain with moving a certain direction" when rotation to the R for bow-and-arrow.   Strength/MMT:  LE MMT (from 12-10) LE MMT Left Right  Hip flex:  (L2) /5 /5  Hip ext: 3+/5 3+/5  Hip abd: 4/5 4/5  Hip add: 4/5 3+/5   Hip IR 4/5 3+/5  Hip ER 4/5 4/5   Posture/Observations:  Pt. Sitting with posterior pelvic tilt and not placing lumbar support appropriately.  -Improved with education.  Pelvic floor:  Palpation: Today: TTP to B adductor brevis and longus  TTP to OI R Glute min, Piriformis, adductors and hip-flexors (from 12-10)   Gait Analysis:  INTERVENTIONS THIS SESSION: Manual: Performed TP release to B adductor brevis and longus to decrease spasm and pain, and allow for decreased PFM and bladder spasms and improved pelvic alignment and balance of musculature for improved function and decreased symptoms.  Dry-needle: Performed TPDN with a .30x172mm needle and standard approach as described below to decrease spasm and pain and allow for improved balance of musculature for improved function and decreased symptoms.  Therex: Educated on and practiced bow-and-arrow rotations with prolonged stretch after rotations to improve spinal mobility and decrease pressure on thoracolumbar nerve roots.   Self-care: reviewed proper placement of lumbar support with Pt's lumbar roll in her travel wheelchair to improve posture and off-load the lumbar spine.  Total time: 75                     Trigger Point Dry Needling - 04/17/19 0001    Consent Given?  Yes    Education Handout Provided  No    Muscles Treated Lower Quadrant  Adductor longus/brevis/magnus    Adductor Response  Twitch response elicited;Palpable increased muscle length             PT Short Term Goals - 03/16/19 1421      PT SHORT TERM GOAL #1   Title  Patient will demonstrate coordinated diaphragmatic breathing with pelvic tilts to demonstrate improved control of diaphragm and TA, to allow for further strengthening of core musculature and decreased pelvic floor spasm.    Baseline  Pt. demonstrates breathing dysfunction and poor PFM coordination evidenced by anal manometry    Time  5    Period  Weeks    Status  New     Target Date  04/20/19      PT SHORT TERM GOAL #2   Title  Patient will report a reduction in pain to no greater than 6/10 over the prior week to demonstrate symptom improvement.    Baseline  Pain is 10/10 at worst, 2/10 at best    Time  5    Period  Weeks    Status  New    Target Date  04/20/19      PT SHORT TERM GOAL #3   Title  Patient will demonstrate HEP x1 in the clinic to demonstrate understanding and proper form to allow for further improvement.    Baseline  Pt. lacks knowledge of therepeutic exercises that can decrease her pain/Sx.    Time  5    Period  Weeks    Status  New    Target  Date  04/20/19      PT SHORT TERM GOAL #4   Title  Patient will report consistent use of foot-stool (squatty-potty) for positioning with BM to decrease pain with BM and intra-abdominal pressure.    Baseline  Pt. having constipation due to PFM dysfunction    Time  5    Period  Weeks    Status  New    Target Date  04/20/19      PT SHORT TERM GOAL #5   Baseline  --        PT Long Term Goals - 03/16/19 1429      PT LONG TERM GOAL #1   Title  Pt. will be able to participate in regular ADL's with least restrictive device without pain increasing greater than 2/10    Baseline  Pt. limited in her ability to perform household duties by increased pain and fatigue.    Time  10    Period  Weeks    Status  New    Target Date  05/25/19      PT LONG TERM GOAL #2   Title  Patient will score at or below 65/300  on the PFDI and 35% on the Female NIH-CPSI to demonstrate a clinically meaningful decrease in disability and distress due to pelvic floor dysfunction.    Baseline  PFDI: 110/300, Female NIH-CPSI: 29/43 (67%)    Time  10    Period  Weeks    Status  New    Target Date  05/25/19      PT LONG TERM GOAL #3   Title  Patient will report no pain with intercourse to demonstrate improved functional ability.    Baseline  Pt. Having significant pain with intercourse    Time  10    Period  Weeks     Status  New    Target Date  05/25/19      PT LONG TERM GOAL #4   Title  Pt will report ability to work in her yard for greater than 30 minutes without extreme fatigue    Baseline  limited to 10-15 minutes before pt requires ending activity    Time  10    Period  Weeks    Status  New    Target Date  05/25/19      PT LONG TERM GOAL #5   Title  Pt. will be able to go 2-3 hours between emptying her bladder without increased pain and empty her bladder fully whether by urostomy, cath, or voluntary release.    Baseline  Pt. has a urostomy but continues to have to self-catheterize. Has pain with bladder filling and when using catheter.    Time  10    Period  Weeks    Status  New    Target Date  05/25/19      Additional Long Term Goals   Additional Long Term Goals  Yes      PT LONG TERM GOAL #6   Title  Patient will report having BM's at least every-other day with consistency between [facility] scale 3-5 over the prior week to demonstrate decreased constipation.    Baseline  Pt. unable to have regula BM's without medication, manometry shows PFM dysfunction    Time  10    Period  Weeks    Status  New    Target Date  05/25/19            Plan - 04/17/19 1309  Clinical Impression Statement  Pt. Responded well to all interventions today, demonstrating decreased adductor spasms and TTP, improved thoracolumbar rotation ROM, as well as understanding and correct performance of all education and exercises provided today. They will continue to benefit from skilled physical therapy to work toward remaining goals and maximize function as well as decrease likelihood of symptom increase or recurrence.     PT Next Visit Plan  re-assess alignment in standing.DN to L hip-flexors, give TENS for improving relaxation of the PFM and emptying? Assess internally and treat.    PT Home Exercise Plan  diaphragmatic breathing, seated posture (lumbar support and avoiding adduction, low back stretch,  side-stretch, hamstring stretch, butterfly stretch, bow-and-arrow.    Consulted and Agree with Plan of Care  Patient       Patient will benefit from skilled therapeutic intervention in order to improve the following deficits and impairments:     Visit Diagnosis: Other muscle spasm  Muscle weakness (generalized)  Difficulty in walking, not elsewhere classified     Problem List Patient Active Problem List   Diagnosis Date Noted  . Seizure (HCC) 11/11/2018  . Major depressive disorder, recurrent episode, moderate (HCC) 02/07/2018  . Nephrolithiasis 04/16/2016  . Numbness 07/28/2015  . Bladder retention 06/23/2015  . Abdominal pain 06/04/2015  . Dizziness 05/18/2015  . Neck pain 05/18/2015  . Complicated migraine 04/28/2015  . Other fatigue 04/28/2015  . Abnormal finding on MRI of brain 04/28/2015  . D (diarrhea) 03/29/2015  . H/O disease 03/29/2015  . Abnormal weight loss 03/29/2015  . Muscle weakness (generalized) 03/14/2015  . Headache, migraine 03/10/2015   Cleophus Molt DPT, ATC Cleophus Molt 04/17/2019, 3:16 PM  [place] [facility] MAIN [facility] SERVICES [address] [place], [place], [address] Phone: [phone]   Fax:  [phone]  Name: Christine Chavez MRN: [id number] Date of Birth: 25-Apr-1980

## 2019-04-17 NOTE — Patient Instructions (Signed)
   Let the top arm rest on your side, and slide along the torso as you rotate. Breathe in as you come forward, out as you open up. Do 2x10 on each side.

## 2019-04-22 ENCOUNTER — Ambulatory Visit: Payer: BC Managed Care – PPO | Admitting: Psychiatry

## 2019-04-22 ENCOUNTER — Ambulatory Visit: Payer: BC Managed Care – PPO | Attending: Gastroenterology

## 2019-04-22 ENCOUNTER — Other Ambulatory Visit: Payer: Self-pay

## 2019-04-22 DIAGNOSIS — M62838 Other muscle spasm: Secondary | ICD-10-CM | POA: Insufficient documentation

## 2019-04-22 DIAGNOSIS — M6281 Muscle weakness (generalized): Secondary | ICD-10-CM | POA: Diagnosis present

## 2019-04-22 DIAGNOSIS — R262 Difficulty in walking, not elsewhere classified: Secondary | ICD-10-CM | POA: Diagnosis present

## 2019-04-22 NOTE — Therapy (Signed)
[facility] MAIN [facility] SERVICES [address] [place], [place], [address] Phone: [phone]   Fax:  [phone]  Physical Therapy Treatment  The patient has been informed of current processes in place at Outpatient Rehab to protect patients from Covid-19 exposure including social distancing, schedule modifications, and new cleaning procedures. After discussing their particular risk with a therapist based on the patient's personal risk factors, the patient has decided to proceed with in-person therapy.   Patient Details  Name: Christine Chavez MRN: [id number] Date of Birth: July 16, 1980 Referring Provider (PT): [name]   Encounter Date: 04/22/2019  PT End of Session - 04/22/19 1208    Visit Number  7    Number of Visits  20    Date for PT Re-Evaluation  05/21/19    Authorization Type  BCBS    Authorization Time Period  from 03/12/2019 through 05/21/2019    Authorization - Visit Number  7    Authorization - Number of Visits  20    PT Start Time  0939    PT Stop Time  1039    PT Time Calculation (min)  60 min    Activity Tolerance  No increased pain;Patient tolerated treatment well    Behavior During Therapy  [facility] for tasks assessed/performed       Past Medical History:  Diagnosis Date  . Complication of anesthesia    ? seizures after anesthesia   . Headache   . Migraines   . Neurogenic bladder   . Renal disorder   . Vision abnormalities     Past Surgical History:  Procedure Laterality Date  . ANTERIOR CRUCIATE LIGAMENT REPAIR  1997  . APPENDECTOMY    . COLONOSCOPY WITH PROPOFOL N/A 11/11/2018   Procedure: COLONOSCOPY WITH PROPOFOL;  Surgeon: Christena Deem, MD;  Location: [facility] ENDOSCOPY;  Service: Endoscopy;  Laterality: N/A;  . CYSTOSCOPY WITH STENT PLACEMENT Right 04/17/2016   Procedure: CYSTOSCOPY WITH STENT PLACEMENT;  Surgeon: Malen Gauze, MD;  Location: [facility] ORS;  Service: Urology;  Laterality: Right;   . ESOPHAGOGASTRODUODENOSCOPY (EGD) WITH PROPOFOL N/A 11/11/2018   Procedure: ESOPHAGOGASTRODUODENOSCOPY (EGD) WITH PROPOFOL;  Surgeon: Christena Deem, MD;  Location: [facility] ENDOSCOPY;  Service: Endoscopy;  Laterality: N/A;  . EXPLORATORY LAPAROTOMY  1999  . KIDNEY STONE SURGERY  04/2016  . REVISION UROSTOMY CUTANEOUS    . REVISION UROSTOMY CUTANEOUS  01/10/2018  . SUPRAPUBIC CATHETER PLACEMENT  08/2017  . TONSILLECTOMY      There were no vitals filed for this visit.   Pelvic Floor Physical Therapy Treatment Note  SCREENING  Changes in medications, allergies, or medical history?: Waiting to see if she needs to keep taking an antibiotic because she is still feeling bad.   SUBJECTIVE  Patient reports: Has only used her lumbar roll a little, has noticed a little less tightness when doing a butterfly stretch. Having a lot of spasms at night. Still having some during the day too. Going to have a nerve biopsy tomorrow.  Precautions:  Neurological diagnosis pending, has "non-specific" brain lesions   Pain update:  Location of pain: R QL/ kidney area and near urethra. Current pain:  3/10  Max pain:  7/10 (during flare from infection) Least pain:  1/10 Nature of pain: sharp to dull ache   Patient Goals: Get rid of the discomfort with catheterizing and ache/bladder pain. Be able to take fewer medications and be able to enjoy spending time with her family again, doing "all  the mom things"    OBJECTIVE  Changes in: Range of Motion/Flexibilty:  (from 12/10) Spine: ~ 2-3 fingers from knee with SB B. ~ 50% reduced R rotation with stretch in L, ~ 75% reduced L rotation with increased tightness. Forward bend: 19 in.from floor. Hips: Unable to achieve neutral pelvis in long-sitting due to hamstring tightness.  Today: re-creation of "pain with moving a certain direction" when rotation to the R for bow-and-arrow.   Strength/MMT:  LE MMT (from 12-10) LE MMT Left Right  Hip flex:   (L2) /5 /5  Hip ext: 3+/5 3+/5  Hip abd: 4/5 4/5  Hip add: 4/5 3+/5  Hip IR 4/5 3+/5  Hip ER 4/5 4/5   Posture/Observations:  Pt. Sitting with posterior pelvic tilt and not placing lumbar support appropriately.  -Improved with education.  Pelvic floor:  Palpation: Today: TTP to B Iliacus  TTP to OI R Glute min, Piriformis, adductors and hip-flexors (from 12-10)   Gait Analysis:  INTERVENTIONS THIS SESSION: Manual: Performed TP release to B Iliacus to decrease spasm and pain, and allow for decreased PFM and bladder spasms and improved pelvic alignment and balance of musculature for improved function and decreased symptoms.  Dry-needle: Performed TPDN with a .30x144mm needle and standard approach as described below to decrease spasm and pain and allow for improved balance of musculature for improved function and decreased symptoms.  Theract: educated on and placed TENS unit medial to ASIS and at PSIS with each channel set up unilaterally and intensity at a strong tingle to decrease excess neural activity and decrease pain and spasm to improve quality of life.    Total time: 60                      Trigger Point Dry Needling - 04/22/19 0001    Consent Given?  Yes    Education Handout Provided  No    Muscles Treated Back/Hip  Iliacus    Dry Needling Comments  L>R    Iliacus Response  Twitch response elicited             PT Short Term Goals - 03/16/19 1421      PT SHORT TERM GOAL #1   Title  Patient will demonstrate coordinated diaphragmatic breathing with pelvic tilts to demonstrate improved control of diaphragm and TA, to allow for further strengthening of core musculature and decreased pelvic floor spasm.    Baseline  Pt. demonstrates breathing dysfunction and poor PFM coordination evidenced by anal manometry    Time  5    Period  Weeks    Status  New    Target Date  04/20/19      PT SHORT TERM GOAL #2   Title  Patient will report a reduction  in pain to no greater than 6/10 over the prior week to demonstrate symptom improvement.    Baseline  Pain is 10/10 at worst, 2/10 at best    Time  5    Period  Weeks    Status  New    Target Date  04/20/19      PT SHORT TERM GOAL #3   Title  Patient will demonstrate HEP x1 in the clinic to demonstrate understanding and proper form to allow for further improvement.    Baseline  Pt. lacks knowledge of therepeutic exercises that can decrease her pain/Sx.    Time  5    Period  Weeks    Status  New  Target Date  04/20/19      PT SHORT TERM GOAL #4   Title  Patient will report consistent use of foot-stool (squatty-potty) for positioning with BM to decrease pain with BM and intra-abdominal pressure.    Baseline  Pt. having constipation due to PFM dysfunction    Time  5    Period  Weeks    Status  New    Target Date  04/20/19      PT SHORT TERM GOAL #5   Baseline  --        PT Long Term Goals - 03/16/19 1429      PT LONG TERM GOAL #1   Title  Pt. will be able to participate in regular ADL's with least restrictive device without pain increasing greater than 2/10    Baseline  Pt. limited in her ability to perform household duties by increased pain and fatigue.    Time  10    Period  Weeks    Status  New    Target Date  05/25/19      PT LONG TERM GOAL #2   Title  Patient will score at or below 65/300  on the PFDI and 35% on the Female NIH-CPSI to demonstrate a clinically meaningful decrease in disability and distress due to pelvic floor dysfunction.    Baseline  PFDI: 110/300, Female NIH-CPSI: 29/43 (67%)    Time  10    Period  Weeks    Status  New    Target Date  05/25/19      PT LONG TERM GOAL #3   Title  Patient will report no pain with intercourse to demonstrate improved functional ability.    Baseline  Pt. Having significant pain with intercourse    Time  10    Period  Weeks    Status  New    Target Date  05/25/19      PT LONG TERM GOAL #4   Title  Pt will report  ability to work in her yard for greater than 30 minutes without extreme fatigue    Baseline  limited to 10-15 minutes before pt requires ending activity    Time  10    Period  Weeks    Status  New    Target Date  05/25/19      PT LONG TERM GOAL #5   Title  Pt. will be able to go 2-3 hours between emptying her bladder without increased pain and empty her bladder fully whether by urostomy, cath, or voluntary release.    Baseline  Pt. has a urostomy but continues to have to self-catheterize. Has pain with bladder filling and when using catheter.    Time  10    Period  Weeks    Status  New    Target Date  05/25/19      Additional Long Term Goals   Additional Long Term Goals  Yes      PT LONG TERM GOAL #6   Title  Patient will report having BM's at least every-other day with consistency between [facility] stool scale 3-5 over the prior week to demonstrate decreased constipation.    Baseline  Pt. unable to have regula BM's without medication, manometry shows PFM dysfunction    Time  10    Period  Weeks    Status  New    Target Date  05/25/19            Plan - 04/22/19 1209  Clinical Impression Statement  Pt. Responded well to all interventions today, demonstrating decreased hip-flexor spasms as well as understanding of all education on use of at-home TENS use provided today. They will continue to benefit from skilled physical therapy to work toward remaining goals and maximize function as well as decrease likelihood of symptom increase or recurrence.     PT Next Visit Plan  re-assess alignment in standing.review how TENS worked, further TPDN to R hip-flexors/adductors TPDN to B hamstrings? begin strengthening for reciprocal inhibition. Internal pelvic exam,    PT Home Exercise Plan  diaphragmatic breathing, seated posture (lumbar support and avoiding adduction, low back stretch, side-stretch, hamstring stretch, butterfly stretch, bow-and-arrow, TENS trial.    Consulted and Agree with  Plan of Care  Patient       Patient will benefit from skilled therapeutic intervention in order to improve the following deficits and impairments:     Visit Diagnosis: Other muscle spasm  Muscle weakness (generalized)  Difficulty in walking, not elsewhere classified     Problem List Patient Active Problem List   Diagnosis Date Noted  . Seizure (HCC) 11/11/2018  . Major depressive disorder, recurrent episode, moderate (HCC) 02/07/2018  . Nephrolithiasis 04/16/2016  . Numbness 07/28/2015  . Bladder retention 06/23/2015  . Abdominal pain 06/04/2015  . Dizziness 05/18/2015  . Neck pain 05/18/2015  . Complicated migraine 04/28/2015  . Other fatigue 04/28/2015  . Abnormal finding on MRI of brain 04/28/2015  . D (diarrhea) 03/29/2015  . H/O disease 03/29/2015  . Abnormal weight loss 03/29/2015  . Muscle weakness (generalized) 03/14/2015  . Headache, migraine 03/10/2015   Cleophus Molt DPT, ATC Cleophus Molt 04/22/2019, 12:22 PM  [facility] MAIN [facility] SERVICES [address] [place], [place], [address] Phone: [phone]   Fax:  [phone]  Name: Karryn Kosinski MRN: [id number] Date of Birth: 01-01-1981

## 2019-04-24 ENCOUNTER — Other Ambulatory Visit: Payer: Self-pay

## 2019-04-24 ENCOUNTER — Ambulatory Visit: Payer: BC Managed Care – PPO

## 2019-04-24 DIAGNOSIS — M62838 Other muscle spasm: Secondary | ICD-10-CM

## 2019-04-24 DIAGNOSIS — M6281 Muscle weakness (generalized): Secondary | ICD-10-CM

## 2019-04-24 DIAGNOSIS — R262 Difficulty in walking, not elsewhere classified: Secondary | ICD-10-CM

## 2019-04-24 NOTE — Therapy (Signed)
[place] [facility] MAIN [facility] SERVICES [address] [place], [place], [address] Phone: [phone]   Fax:  [phone]  Physical Therapy Treatment  The patient has been informed of current processes in place at Outpatient Rehab to protect patients from Covid-19 exposure including social distancing, schedule modifications, and new cleaning procedures. After discussing their particular risk with a therapist based on the patient's personal risk factors, the patient has decided to proceed with in-person therapy.   Patient Details  Name: Christine Chavez MRN: [id number] Date of Birth: 1981-01-22 Referring Provider (PT): [name]   Encounter Date: 04/24/2019  PT End of Session - 04/24/19 1403    Visit Number  8    Number of Visits  20    Date for PT Re-Evaluation  05/21/19    Authorization Type  BCBS    Authorization Time Period  from 03/12/2019 through 05/21/2019    Authorization - Visit Number  8    Authorization - Number of Visits  20    PT Start Time  1105    PT Stop Time  1205    PT Time Calculation (min)  60 min    Activity Tolerance  No increased pain;Patient tolerated treatment well    Behavior During Therapy  [facility] for tasks assessed/performed       Past Medical History:  Diagnosis Date  . Complication of anesthesia    ? seizures after anesthesia   . Headache   . Migraines   . Neurogenic bladder   . Renal disorder   . Vision abnormalities     Past Surgical History:  Procedure Laterality Date  . ANTERIOR CRUCIATE LIGAMENT REPAIR  1997  . APPENDECTOMY    . COLONOSCOPY WITH PROPOFOL N/A 11/11/2018   Procedure: COLONOSCOPY WITH PROPOFOL;  Surgeon: Christena Deem, MD;  Location: [facility] ENDOSCOPY;  Service: Endoscopy;  Laterality: N/A;  . CYSTOSCOPY WITH STENT PLACEMENT Right 04/17/2016   Procedure: CYSTOSCOPY WITH STENT PLACEMENT;  Surgeon: Malen Gauze, MD;  Location: [facility] ORS;  Service: Urology;  Laterality: Right;   . ESOPHAGOGASTRODUODENOSCOPY (EGD) WITH PROPOFOL N/A 11/11/2018   Procedure: ESOPHAGOGASTRODUODENOSCOPY (EGD) WITH PROPOFOL;  Surgeon: Christena Deem, MD;  Location: [facility] ENDOSCOPY;  Service: Endoscopy;  Laterality: N/A;  . EXPLORATORY LAPAROTOMY  1999  . KIDNEY STONE SURGERY  04/2016  . REVISION UROSTOMY CUTANEOUS    . REVISION UROSTOMY CUTANEOUS  01/10/2018  . SUPRAPUBIC CATHETER PLACEMENT  08/2017  . TONSILLECTOMY      There were no vitals filed for this visit.    Pelvic Floor Physical Therapy Treatment Note  SCREENING  Changes in medications, allergies, or medical history?: Waiting to see if she needs to keep taking an antibiotic because she is still feeling bad.   SUBJECTIVE  Patient reports: Had the nerve biopsy yesterday so it was a long day. Used the TENS 3 times 30 min. And feels like the bladder spasms may have been a little better but hard to know. Woke up with spasms today.  Precautions:  Neurological diagnosis pending, has "non-specific" brain lesions   Pain update:  Location of pain: R QL/ kidney area and near urethra. Current pain:  4/10  Max pain:  7/10 (when it spasms) Least pain:  1/10 Nature of pain: sharp to dull ache   Patient Goals: Get rid of the discomfort with catheterizing and ache/bladder pain. Be able to take fewer medications and be able to enjoy spending time with her family again, doing "all the  mom things"    OBJECTIVE  Changes in: Range of Motion/Flexibilty:  (from 12/10) Spine: ~ 2-3 fingers from knee with SB B. ~ 50% reduced R rotation with stretch in L, ~ 75% reduced L rotation with increased tightness. Forward bend: 19 in.from floor. Hips: Unable to achieve neutral pelvis in long-sitting due to hamstring tightness.  Today: L rotation improved to ~ 50% restricted with much less pain following treatment.   Strength/MMT:  LE MMT (from 12-10) LE MMT Left Right  Hip flex:  (L2) /5 /5  Hip ext: 3+/5 3+/5  Hip abd: 4/5 4/5   Hip add: 4/5 3+/5  Hip IR 4/5 3+/5  Hip ER 4/5 4/5   Posture/Observations:  Pt. Attempting to use Improved sitting posture but has forgotten her lumbar support again.  Pelvic floor:  Palpation: Today: TTP to Iliocostalis and multifidus at L thoracolumbar junction. Decreased fascial mobility through L lateral abdomen that re-creates pain with pull in posterior/inferior direction from lateral ribcage.  TTP to OI R Glute min, Piriformis, adductors and hip-flexors (from 12-10)   Gait Analysis:  INTERVENTIONS THIS SESSION: Manual: Performed TP release to B Iliacus to decrease spasm and pain, and allow for decreased PFM and bladder spasms and improved pelvic alignment and balance of musculature for improved function and decreased symptoms.  Dry-needle: Performed TPDN with a .30x13mm needle and standard approach as described below to decrease spasm and pain and allow for improved balance of musculature for improved function and decreased symptoms.  Theract: educated on and placed TENS unit medial to ASIS and at PSIS with each channel set up unilaterally and intensity at a strong tingle to decrease excess neural activity and decrease pain and spasm to improve quality of life.    Total time: 60                           PT Short Term Goals - 03/16/19 1421      PT SHORT TERM GOAL #1   Title  Patient will demonstrate coordinated diaphragmatic breathing with pelvic tilts to demonstrate improved control of diaphragm and TA, to allow for further strengthening of core musculature and decreased pelvic floor spasm.    Baseline  Pt. demonstrates breathing dysfunction and poor PFM coordination evidenced by anal manometry    Time  5    Period  Weeks    Status  New    Target Date  04/20/19      PT SHORT TERM GOAL #2   Title  Patient will report a reduction in pain to no greater than 6/10 over the prior week to demonstrate symptom improvement.    Baseline  Pain is 10/10 at  worst, 2/10 at best    Time  5    Period  Weeks    Status  New    Target Date  04/20/19      PT SHORT TERM GOAL #3   Title  Patient will demonstrate HEP x1 in the clinic to demonstrate understanding and proper form to allow for further improvement.    Baseline  Pt. lacks knowledge of therepeutic exercises that can decrease her pain/Sx.    Time  5    Period  Weeks    Status  New    Target Date  04/20/19      PT SHORT TERM GOAL #4   Title  Patient will report consistent use of foot-stool (squatty-potty) for positioning with BM to decrease pain with BM  and intra-abdominal pressure.    Baseline  Pt. having constipation due to PFM dysfunction    Time  5    Period  Weeks    Status  New    Target Date  04/20/19      PT SHORT TERM GOAL #5   Baseline  --        PT Long Term Goals - 03/16/19 1429      PT LONG TERM GOAL #1   Title  Pt. will be able to participate in regular ADL's with least restrictive device without pain increasing greater than 2/10    Baseline  Pt. limited in her ability to perform household duties by increased pain and fatigue.    Time  10    Period  Weeks    Status  New    Target Date  05/25/19      PT LONG TERM GOAL #2   Title  Patient will score at or below 65/300  on the PFDI and 35% on the Female NIH-CPSI to demonstrate a clinically meaningful decrease in disability and distress due to pelvic floor dysfunction.    Baseline  PFDI: 110/300, Female NIH-CPSI: 29/43 (67%)    Time  10    Period  Weeks    Status  New    Target Date  05/25/19      PT LONG TERM GOAL #3   Title  Patient will report no pain with intercourse to demonstrate improved functional ability.    Baseline  Pt. Having significant pain with intercourse    Time  10    Period  Weeks    Status  New    Target Date  05/25/19      PT LONG TERM GOAL #4   Title  Pt will report ability to work in her yard for greater than 30 minutes without extreme fatigue    Baseline  limited to 10-15 minutes  before pt requires ending activity    Time  10    Period  Weeks    Status  New    Target Date  05/25/19      PT LONG TERM GOAL #5   Title  Pt. will be able to go 2-3 hours between emptying her bladder without increased pain and empty her bladder fully whether by urostomy, cath, or voluntary release.    Baseline  Pt. has a urostomy but continues to have to self-catheterize. Has pain with bladder filling and when using catheter.    Time  10    Period  Weeks    Status  New    Target Date  05/25/19      Additional Long Term Goals   Additional Long Term Goals  Yes      PT LONG TERM GOAL #6   Title  Patient will report having BM's at least every-other day with consistency between [facility] stool scale 3-5 over the prior week to demonstrate decreased constipation.    Baseline  Pt. unable to have regula BM's without medication, manometry shows PFM dysfunction    Time  10    Period  Weeks    Status  New    Target Date  05/25/19            Plan - 04/24/19 1409    Clinical Impression Statement  Pt. Responded well to all interventions today, demonstrating improved L lateral abdomen fascial mobility, L thoracic rotation ROM and decreased spasms/pain with motion as well as understanding and correct performance  of all education and exercises provided today. They will continue to benefit from skilled physical therapy to work toward remaining goals and maximize function as well as decrease likelihood of symptom increase or recurrence.     PT Next Visit Plan  re-assess pelvic alignment and thoracic rotation ROM B, review how TENS worked, further TPDN to R hip-flexors/adductors TPDN to B hamstrings? begin strengthening for reciprocal inhibition. Internal pelvic exam,    PT Home Exercise Plan  diaphragmatic breathing, seated posture (lumbar support and avoiding adduction, low back stretch, side-stretch, hamstring stretch, butterfly stretch, bow-and-arrow, TENS trial.    Consulted and Agree with Plan  of Care  Patient       Patient will benefit from skilled therapeutic intervention in order to improve the following deficits and impairments:     Visit Diagnosis: Other muscle spasm  Muscle weakness (generalized)  Difficulty in walking, not elsewhere classified     Problem List Patient Active Problem List   Diagnosis Date Noted  . Seizure (HCC) 11/11/2018  . Major depressive disorder, recurrent episode, moderate (HCC) 02/07/2018  . Nephrolithiasis 04/16/2016  . Numbness 07/28/2015  . Bladder retention 06/23/2015  . Abdominal pain 06/04/2015  . Dizziness 05/18/2015  . Neck pain 05/18/2015  . Complicated migraine 04/28/2015  . Other fatigue 04/28/2015  . Abnormal finding on MRI of brain 04/28/2015  . D (diarrhea) 03/29/2015  . H/O disease 03/29/2015  . Abnormal weight loss 03/29/2015  . Muscle weakness (generalized) 03/14/2015  . Headache, migraine 03/10/2015   Cleophus Molt DPT, ATC Cleophus Molt 04/24/2019, 2:09 PM   [place] MAIN [facility] SERVICES [address] [place], [place], [address] Phone: [phone]   Fax:  [phone]  Name: Banita Lehn MRN: [id number] Date of Birth: 13-Jan-1981

## 2019-04-28 ENCOUNTER — Other Ambulatory Visit: Payer: Self-pay | Admitting: Psychiatry

## 2019-04-28 DIAGNOSIS — F332 Major depressive disorder, recurrent severe without psychotic features: Secondary | ICD-10-CM

## 2019-04-28 DIAGNOSIS — F419 Anxiety disorder, unspecified: Secondary | ICD-10-CM

## 2019-04-29 ENCOUNTER — Ambulatory Visit: Payer: BC Managed Care – PPO

## 2019-04-29 ENCOUNTER — Other Ambulatory Visit: Payer: Self-pay

## 2019-04-29 DIAGNOSIS — R262 Difficulty in walking, not elsewhere classified: Secondary | ICD-10-CM

## 2019-04-29 DIAGNOSIS — M62838 Other muscle spasm: Secondary | ICD-10-CM

## 2019-04-29 DIAGNOSIS — M6281 Muscle weakness (generalized): Secondary | ICD-10-CM

## 2019-04-29 NOTE — Therapy (Signed)
[place] [facility] MAIN [facility] SERVICES [address] [place], [place], [address] Phone: [phone]   Fax:  [phone]  Physical Therapy Treatment  The patient has been informed of current processes in place at Outpatient Rehab to protect patients from Covid-19 exposure including social distancing, schedule modifications, and new cleaning procedures. After discussing their particular risk with a therapist based on the patient's personal risk factors, the patient has decided to proceed with in-person therapy.   Patient Details  Name: Christine Chavez MRN: [id number] Date of Birth: [birth year] Referring Provider (PT): [name]   Encounter Date: 04/29/2019  PT End of Session - 04/29/19 1230    Visit Number  9    Number of Visits  20    Date for PT Re-Evaluation  05/21/19    Authorization Type  BCBS    Authorization Time Period  from 03/12/2019 through 05/21/2019    Authorization - Visit Number  9    Authorization - Number of Visits  20    PT Start Time  0930    PT Stop Time  1035    PT Time Calculation (min)  65 min    Activity Tolerance  No increased pain;Patient tolerated treatment well    Behavior During Therapy  [facility] for tasks assessed/performed       Past Medical History:  Diagnosis Date  . Complication of anesthesia    ? seizures after anesthesia   . Headache   . Migraines   . Neurogenic bladder   . Renal disorder   . Vision abnormalities     Past Surgical History:  Procedure Laterality Date  . ANTERIOR CRUCIATE LIGAMENT REPAIR  1997  . APPENDECTOMY    . COLONOSCOPY WITH PROPOFOL N/A 11/11/2018   Procedure: COLONOSCOPY WITH PROPOFOL;  Surgeon: Christena Deem, MD;  Location: [facility] ENDOSCOPY;  Service: Endoscopy;  Laterality: N/A;  . CYSTOSCOPY WITH STENT PLACEMENT Right 04/17/2016   Procedure: CYSTOSCOPY WITH STENT PLACEMENT;  Surgeon: Malen Gauze, MD;  Location: [facility] ORS;  Service: Urology;  Laterality: Right;   . ESOPHAGOGASTRODUODENOSCOPY (EGD) WITH PROPOFOL N/A 11/11/2018   Procedure: ESOPHAGOGASTRODUODENOSCOPY (EGD) WITH PROPOFOL;  Surgeon: Christena Deem, MD;  Location: [facility] ENDOSCOPY;  Service: Endoscopy;  Laterality: N/A;  . EXPLORATORY LAPAROTOMY  1999  . KIDNEY STONE SURGERY  04/2016  . REVISION UROSTOMY CUTANEOUS    . REVISION UROSTOMY CUTANEOUS  01/10/2018  . SUPRAPUBIC CATHETER PLACEMENT  08/2017  . TONSILLECTOMY      There were no vitals filed for this visit.   Pelvic Floor Physical Therapy Treatment Note  SCREENING  Changes in medications, allergies, or medical history?: Waiting to see if she needs to keep taking an antibiotic because she is still feeling bad.   SUBJECTIVE  Patient reports: Feels like she can turn left a lot easier still, still has a stopping point when turning right. Had a bad day on Sunday with spasms but feels like she is having fewer spasms through the day and more at the end of the day. Feels like she still fatigues really easily. Has had a lot of bloating, had to take Linzess on Saturday, has sometimes even had to take a suppository or enema to get it (BM) started.   Precautions:  Neurological diagnosis pending, has "non-specific" brain lesions   Pain update:  Location of pain: R QL/ kidney area and near urethra. Current pain:  3/10  Max pain:  8/10 (when it spasms) Least pain:  1/10 [name]  of pain: sharp to dull ache   Patient Goals: Get rid of the discomfort with catheterizing and ache/bladder pain. Be able to take fewer medications and be able to enjoy spending time with her family again, doing "all the mom things"    OBJECTIVE  Changes in: Range of Motion/Flexibilty:  (from 12/10) Spine: ~ 2-3 fingers from knee with SB B. ~ 50% reduced R rotation with stretch in L, ~ 75% reduced L rotation with increased tightness. Forward bend: 19 in.from floor. Hips: Unable to achieve neutral pelvis in long-sitting due to hamstring  tightness.  Today: R rotation improved to ~ 30% restricted motion with more of a "pulling/stretch" rather than pain. Decreased thoracic extension ROM.   Strength/MMT:  LE MMT (from 12-10) LE MMT Left Right  Hip flex:  (L2) /5 /5  Hip ext: 3+/5 3+/5  Hip abd: 4/5 4/5  Hip add: 4/5 3+/5  Hip IR 4/5 3+/5  Hip ER 4/5 4/5   Posture/Observations:  Pt. Using her lumbar roll appropriately today and demonstrating improved posture because of it.   Pelvic floor:  Palpation: Today: TTP to erector spinae at R thoracolumbar junction. Mild decreased fascial mobility through R lateral abdomen.  TTP to OI R Glute min, Piriformis, adductors and hip-flexors (from 12-10)   Gait Analysis:  INTERVENTIONS THIS SESSION: Manual: Performed STM and TP release to R erector spinae near T-L junction followed by MFR along R lateral abdomen and MWM into R rotation to decrease spasm and pain, and allow for decreased PFM and bladder spasms and improved pelvic alignment and balance of musculature for improved function and decreased symptoms.  Dry-needle: Performed TPDN with a .30x165mm needle and standard approach as described below to decrease spasm and pain and allow for improved balance of musculature for improved function and decreased symptoms.  Theract: educated on use of squatty potty, colonic massage, bowel retraining, and toileting techniques to decrease difficulty with starting BM's and improve motility and regularity with less reliance on medications.   Total time: 65                    Trigger Point Dry Needling - 04/29/19 0001    Consent Given?  Yes    Education Handout Provided  No    Muscles Treated Back/Hip  Erector spinae    Erector spinae Response  Twitch response elicited;Palpable increased muscle length             PT Short Term Goals - 03/16/19 1421      PT SHORT TERM GOAL #1   Title  Patient will demonstrate coordinated diaphragmatic breathing with pelvic  tilts to demonstrate improved control of diaphragm and TA, to allow for further strengthening of core musculature and decreased pelvic floor spasm.    Baseline  Pt. demonstrates breathing dysfunction and poor PFM coordination evidenced by anal manometry    Time  5    Period  Weeks    Status  New    Target Date  04/20/19      PT SHORT TERM GOAL #2   Title  Patient will report a reduction in pain to no greater than 6/10 over the prior week to demonstrate symptom improvement.    Baseline  Pain is 10/10 at worst, 2/10 at best    Time  5    Period  Weeks    Status  New    Target Date  04/20/19      PT SHORT TERM GOAL #3  Title  Patient will demonstrate HEP x1 in the clinic to demonstrate understanding and proper form to allow for further improvement.    Baseline  Pt. lacks knowledge of therepeutic exercises that can decrease her pain/Sx.    Time  5    Period  Weeks    Status  New    Target Date  04/20/19      PT SHORT TERM GOAL #4   Title  Patient will report consistent use of foot-stool (squatty-potty) for positioning with BM to decrease pain with BM and intra-abdominal pressure.    Baseline  Pt. having constipation due to PFM dysfunction    Time  5    Period  Weeks    Status  New    Target Date  04/20/19      PT SHORT TERM GOAL #5   Baseline  --        PT Long Term Goals - 03/16/19 1429      PT LONG TERM GOAL #1   Title  Pt. will be able to participate in regular ADL's with least restrictive device without pain increasing greater than 2/10    Baseline  Pt. limited in her ability to perform household duties by increased pain and fatigue.    Time  10    Period  Weeks    Status  New    Target Date  05/25/19      PT LONG TERM GOAL #2   Title  Patient will score at or below 65/300  on the PFDI and 35% on the Female NIH-CPSI to demonstrate a clinically meaningful decrease in disability and distress due to pelvic floor dysfunction.    Baseline  PFDI: 110/300, Female NIH-CPSI:  29/43 (67%)    Time  10    Period  Weeks    Status  New    Target Date  05/25/19      PT LONG TERM GOAL #3   Title  Patient will report no pain with intercourse to demonstrate improved functional ability.    Baseline  Pt. Having significant pain with intercourse    Time  10    Period  Weeks    Status  New    Target Date  05/25/19      PT LONG TERM GOAL #4   Title  Pt will report ability to work in her yard for greater than 30 minutes without extreme fatigue    Baseline  limited to 10-15 minutes before pt requires ending activity    Time  10    Period  Weeks    Status  New    Target Date  05/25/19      PT LONG TERM GOAL #5   Title  Pt. will be able to go 2-3 hours between emptying her bladder without increased pain and empty her bladder fully whether by urostomy, cath, or voluntary release.    Baseline  Pt. has a urostomy but continues to have to self-catheterize. Has pain with bladder filling and when using catheter.    Time  10    Period  Weeks    Status  New    Target Date  05/25/19      Additional Long Term Goals   Additional Long Term Goals  Yes      PT LONG TERM GOAL #6   Title  Patient will report having BM's at least every-other day with consistency between [facility] stool scale 3-5 over the prior week to demonstrate decreased constipation.  Baseline  Pt. unable to have regula BM's without medication, manometry shows PFM dysfunction    Time  10    Period  Weeks    Status  New    Target Date  05/25/19            Plan - 04/29/19 1231    Clinical Impression Statement  Pt. Responded well to all interventions today, demonstrating improved rotational ROM, decreased pain and spasm, as well as understanding and correct performance of all education and exercises provided today. They will continue to benefit from skilled physical therapy to work toward remaining goals and maximize function as well as decrease likelihood of symptom increase or recurrence.     PT Next  Visit Plan  re-assess pelvic alignment and thoracic rotation ROM B, review how TENS worked, further TPDN to R hip-flexors/adductors TPDN to B hamstrings? begin strengthening for reciprocal inhibition. Internal pelvic exam,    PT Home Exercise Plan  diaphragmatic breathing, seated posture (lumbar support and avoiding adduction, low back stretch, side-stretch, hamstring stretch, butterfly stretch, bow-and-arrow, TENS trial, squatty potty, bowel retraining, toileting tips, colonic massage.    Consulted and Agree with Plan of Care  Patient       Patient will benefit from skilled therapeutic intervention in order to improve the following deficits and impairments:     Visit Diagnosis: Other muscle spasm  Muscle weakness (generalized)  Difficulty in walking, not elsewhere classified     Problem List Patient Active Problem List   Diagnosis Date Noted  . Seizure ([place]) 11/11/2018  . Major depressive disorder, recurrent episode, moderate ([place]) 02/07/2018  . Nephrolithiasis 04/16/2016  . Numbness 07/28/2015  . Bladder retention 06/23/2015  . Abdominal pain 06/04/2015  . Dizziness 05/18/2015  . Neck pain 05/18/2015  . Complicated migraine 35/45/6256  . Other fatigue 04/28/2015  . Abnormal finding on MRI of brain 04/28/2015  . D (diarrhea) 03/29/2015  . H/O disease 03/29/2015  . Abnormal weight loss 03/29/2015  . Muscle weakness (generalized) 03/14/2015  . Headache, migraine 03/10/2015   Willa Rough DPT, ATC Willa Rough 04/29/2019, 12:37 PM  [place] MAIN [facility] SERVICES [address] [place], [place], [address] Phone: [phone]   Fax:  [phone]  Name: Christine Chavez MRN: [id number] Date of Birth: February 03, 1981

## 2019-04-29 NOTE — Patient Instructions (Signed)
The "I Love You" massage for your colon  Start by resting or lying quietly. 1. Using your fingertips, you apply light pressure in a stroking motion. 2. Start with your hands on the left hand side of your abdomen, below the rib cage, and stroke or make small circles down towards your left hip. This is the "I" of the "I Love You" massage. 3. Next, you are going to make the strokes in an upside down "L" shape. Run your fingertips from the right side of your upper abdomen, across under your ribs, and down the left side. 4. Now you are going to run through the whole path. This is the "U". Start on the bottom right of your abdomen. Stroke up the right side, across under the rib cage, and down the left side.   5. Finally, Using your fingertips, you apply light pressure in small circles  through the whole "U" path to "wake up" the smooth muscles of the intestines and get things moving.    Up the right, across under the rib cage, down the left and inwards, moving in a clockwise motion (if you are looking down upon your own abdomen)  Essentially you are massaging along the path of your large intestine. Our colon starts roughly in the bottom right of our abdomen, travels up the right hand side, turns and runs across below our rib cage, and then down the left side and in towards the pubic bone. When I teach this massage for people to do at home I have them start with 10 minutes. However, anecdotally, many people tell me 15-20 minutes really gets things going! After about 5 minutes of this massage my insides start gurgling and making noises. 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 A big problem is constipation resulting from either medication side effects, post surgical changes, or the fact that in general people in the hospital don't move as much (and exercise such as walking also helps regulate our digestive system). One of the first "exercises" I would teach them is how to do  the "I Love You" abdominal massage. Time and time again I have people come back to me and say that massaging their abdominal tissue helped their digestive issues. Give it a try today!  *Adapted from article written by Gwenlyn Perking, PT, DPT   Bowel retraining program: 1) Start by drinking a hot (optionally caffeinated) beverage 2) Do your "I love you" colonic massage  3) Go for a short walk 4) Go to the toilet and sit with feet up on squatty potty and relaxing forward on your knees with tall spine. Take deep, lengthening, breaths and allow up to 10 minutes to have a BM without "straining" before you move on with the day.      Toileting Techniques for Bowel Movements (Defecation) Using your belly (abdomen) and pelvic floor muscles to have a bowel movement is usually instinctive.  Sometimes people can have problems with these muscles and have to relearn proper defecation (emptying) techniques.  If you have weakness in your muscles, organs that are falling out, decreased sensation in your pelvis, or ignore your urge to go, you may find yourself straining to have a bowel movement.  You are straining if you are: . holding your breath or taking in a huge gulp of air and holding it  . keeping your lips and jaw tensed and closed tightly . turning red in the face because of excessive pushing or  forcing . developing or worsening your  hemorrhoids . getting faint while pushing . not emptying completely and have to defecate many times a day  If you are straining, you are actually making it harder for yourself to have a bowel movement.  Many people find they are pulling up with the pelvic floor muscles and closing off instead of opening the anus. Due to lack pelvic floor relaxation and coordination the abdominal muscles, one has to work harder to push the feces out.  Many people have never been taught how to defecate efficiently and effectively.  Notice what happens to your body when you are having a  bowel movement.  While you are sitting on the toilet pay attention to the following areas: . Jaw and mouth position . Angle of your hips   . Whether your feet touch the ground or not . Arm placement  . Spine position . Waist . Belly tension . Anus (opening of the anal canal)  An Evacuation/Defecation Plan   Here are the 4 basic points:  1. Lean forward enough for your elbows to rest on your knees 2. Support your feet on the floor or use a low stool if your feet don't touch the floor  3. Push out your belly as if you have swallowed a beach ball--you should feel a widening of your waist 4. Open and relax your pelvic floor muscles, rather than tightening around the anus       The following conditions my require modifications to your toileting posture:  . If you have had surgery in the past that limits your back, hip, pelvic, knee or ankle flexibility . Constipation   Your healthcare practitioner may make the following additional suggestions and adjustments:  1) Sit on the toilet  a) Make sure your feet are supported. b) Notice your hip angle and spine position--most people find it effective to lean forward or raise their knees, which can help the muscles around the anus to relax  c) When you lean forward, place your forearms on your thighs for support  2) Relax suggestions a) Breath deeply in through your nose and out slowly through your mouth as if you are smelling the flowers and blowing out the candles. b) To become aware of how to relax your muscles, contracting and releasing muscles can be helpful.  Pull your pelvic floor muscles in tightly by using the image of holding back gas, or closing around the anus (visualize making a circle smaller) and lifting the anus up and in.  Then release the muscles and your anus should drop down and feel open. Repeat 5 times ending with the feeling of relaxation. c) Keep your pelvic floor muscles relaxed; let your belly bulge out. d) The  digestive tract starts at the mouth and ends at the anal opening, so be sure to relax both ends of the tube.  Place your tongue on the roof of your mouth with your teeth separated.  This helps relax your mouth and will help to relax the anus at the same time.  3) Empty (defecation) a) Keep your pelvic floor and sphincter relaxed, then bulge your anal muscles.  Make the anal opening wide.  b) Stick your belly out as if you have swallowed a beach ball. c) Make your belly wall hard using your belly muscles while continuing to breathe. Doing this makes it easier to open your anus. d) Breath out and give a grunt (or try using other sounds such as ahhhh, shhhhh,  ohhhh or grrrrrrr).  4) Finish a) As you finish your bowel movement, pull the pelvic floor muscles up and in.  This will leave your anus in the proper place rather than remaining pushed out and down. If you leave your anus pushed out and down, it will start to feel as though that is normal and give you incorrect signals about needing to have a bowel movement.

## 2019-05-01 ENCOUNTER — Ambulatory Visit: Payer: BC Managed Care – PPO

## 2019-05-01 ENCOUNTER — Other Ambulatory Visit: Payer: Self-pay

## 2019-05-01 DIAGNOSIS — M62838 Other muscle spasm: Secondary | ICD-10-CM

## 2019-05-01 DIAGNOSIS — R262 Difficulty in walking, not elsewhere classified: Secondary | ICD-10-CM

## 2019-05-01 DIAGNOSIS — M6281 Muscle weakness (generalized): Secondary | ICD-10-CM

## 2019-05-01 NOTE — Therapy (Signed)
[facility] [facility] MAIN [facility] SERVICES [address] [place], [place], [address] Phone: [phone]   Fax:  [phone]  Physical Therapy Treatment  The patient has been informed of current processes in place at Outpatient Rehab to protect patients from Covid-19 exposure including social distancing, schedule modifications, and new cleaning procedures. After discussing their particular risk with a therapist based on the patient's personal risk factors, the patient has decided to proceed with in-person therapy.   Patient Details  Name: Christine Chavez MRN: [id number] Date of Birth: [birth year] Referring Provider (PT): [name]   Encounter Date: 05/01/2019  PT End of Session - 05/02/19 1203    Visit Number  10    Number of Visits  20    Date for PT Re-Evaluation  05/21/19    Authorization Type  [employer]    Authorization Time Period  from 03/12/2019 through 05/21/2019    Authorization - Visit Number  10    Authorization - Number of Visits  20    PT Start Time  1100    PT Stop Time  1200    PT Time Calculation (min)  60 min    Activity Tolerance  No increased pain;Patient tolerated treatment well    Behavior During Therapy  [facility] for tasks assessed/performed       Past Medical History:  Diagnosis Date  . Complication of anesthesia    ? seizures after anesthesia   . Headache   . Migraines   . Neurogenic bladder   . Renal disorder   . Vision abnormalities     Past Surgical History:  Procedure Laterality Date  . ANTERIOR CRUCIATE LIGAMENT REPAIR  1997  . APPENDECTOMY    . COLONOSCOPY WITH PROPOFOL N/A 11/11/2018   Procedure: COLONOSCOPY WITH PROPOFOL;  Surgeon: Christena Deem, MD;  Location: [facility] ENDOSCOPY;  Service: Endoscopy;  Laterality: N/A;  . CYSTOSCOPY WITH STENT PLACEMENT Right 04/17/2016   Procedure: CYSTOSCOPY WITH STENT PLACEMENT;  Surgeon: Malen Gauze, MD;  Location: [facility] ORS;  Service: Urology;  Laterality:  Right;  . ESOPHAGOGASTRODUODENOSCOPY (EGD) WITH PROPOFOL N/A 11/11/2018   Procedure: ESOPHAGOGASTRODUODENOSCOPY (EGD) WITH PROPOFOL;  Surgeon: Christena Deem, MD;  Location[facility] ENDOSCOPY;  Service: Endoscopy;  Laterality: N/A;  . EXPLORATORY LAPAROTOMY  1999  . KIDNEY STONE SURGERY  04/2016  . REVISION UROSTOMY CUTANEOUS    . REVISION UROSTOMY CUTANEOUS  01/10/2018  . SUPRAPUBIC CATHETER PLACEMENT  08/2017  . TONSILLECTOMY      There were no vitals filed for this visit.      Pelvic Floor Physical Therapy Treatment Note  SCREENING  Changes in medications, allergies, or medical history?: none  SUBJECTIVE  Patient reports: She is very tired today. Has an appointment at the pain clinic for [facility]. Is having quite a bit of pain in her back even though she still feels more limber. When she rotates Right she has pain on the Left. She almost fell in the kitchen yesterday and feels like she pulled something in her R inner thigh. Has had a feeling of constant. Did her ILY massage yesterday, was too hectic today to try. Is going to order the squatty potty.  Precautions:  Neurological diagnosis pending, has "non-specific" brain lesions   Pain update:  Location of pain: R QL/ kidney area and near urethra. Current pain:  4/10  Max pain:  6-7/10 (when it spasms) Least pain:  1/10 Nature of pain: sharp to dull ache   Patient Goals: Get  rid of the discomfort with catheterizing and ache/bladder pain. Be able to take fewer medications and be able to enjoy spending time with her family again, doing "all the mom things"    OBJECTIVE  Changes in: Range of Motion/Flexibilty:  (from 12/10) Spine: ~ 2-3 fingers from knee with SB B. ~ 50% reduced R rotation with stretch in L, ~ 75% reduced L rotation with increased tightness. Forward bend: 19 in.from floor. Hips: Unable to achieve neutral pelvis in long-sitting due to hamstring tightness.  Today: R rotation improved to ~ 30% restricted  motion with more of a "pulling/stretch" rather than pain. Decreased thoracic extension ROM.   Strength/MMT:  LE MMT (from 12-10) LE MMT Left Right  Hip flex:  (L2) /5 /5  Hip ext: 3+/5 3+/5  Hip abd: 4/5 4/5  Hip add: 4/5 3+/5  Hip IR 4/5 3+/5  Hip ER 4/5 4/5   Posture/Observations:  In standing > supine, L ASIS high, R PSIS high, R out-flare vs. L in-flare. Ankles near level in supine.  Pelvic floor:  Palpation: Today: TTP to erector spinae at R thoracolumbar junction, multifidus at T10-L5   TTP to OI R Glute min, Piriformis, adductors and hip-flexors (from 12-10)   Gait Analysis:  INTERVENTIONS THIS SESSION: Manual: Performed STM and TP release to to erector spinae at R thoracolumbar junction, multifidus at T10-L5  followed by  MWM into R rotation to decrease spasm and pain, and allow for decreased PFM and bladder spasms and improved pelvic alignment and balance of musculature for improved function and decreased symptoms.  Dry-needle: Performed TPDN with a .30x92mm needle and standard approach as described below to decrease spasm and pain and allow for improved balance of musculature for improved function and decreased symptoms.   Total time: 60                          PT Short Term Goals - 03/16/19 1421      PT SHORT TERM GOAL #1   Title  Patient will demonstrate coordinated diaphragmatic breathing with pelvic tilts to demonstrate improved control of diaphragm and TA, to allow for further strengthening of core musculature and decreased pelvic floor spasm.    Baseline  Pt. demonstrates breathing dysfunction and poor PFM coordination evidenced by anal manometry    Time  5    Period  Weeks    Status  New    Target Date  04/20/19      PT SHORT TERM GOAL #2   Title  Patient will report a reduction in pain to no greater than 6/10 over the prior week to demonstrate symptom improvement.    Baseline  Pain is 10/10 at worst, 2/10 at best    Time  5     Period  Weeks    Status  New    Target Date  04/20/19      PT SHORT TERM GOAL #3   Title  Patient will demonstrate HEP x1 in the clinic to demonstrate understanding and proper form to allow for further improvement.    Baseline  Pt. lacks knowledge of therepeutic exercises that can decrease her pain/Sx.    Time  5    Period  Weeks    Status  New    Target Date  04/20/19      PT SHORT TERM GOAL #4   Title  Patient will report consistent use of foot-stool (squatty-potty) for positioning with BM to decrease pain  with BM and intra-abdominal pressure.    Baseline  Pt. having constipation due to PFM dysfunction    Time  5    Period  Weeks    Status  New    Target Date  04/20/19      PT SHORT TERM GOAL #5   Baseline  --        PT Long Term Goals - 03/16/19 1429      PT LONG TERM GOAL #1   Title  Pt. will be able to participate in regular ADL's with least restrictive device without pain increasing greater than 2/10    Baseline  Pt. limited in her ability to perform household duties by increased pain and fatigue.    Time  10    Period  Weeks    Status  New    Target Date  05/25/19      PT LONG TERM GOAL #2   Title  Patient will score at or below 65/300  on the PFDI and 35% on the Female NIH-CPSI to demonstrate a clinically meaningful decrease in disability and distress due to pelvic floor dysfunction.    Baseline  PFDI: 110/300, Female NIH-CPSI: 29/43 (67%)    Time  10    Period  Weeks    Status  New    Target Date  05/25/19      PT LONG TERM GOAL #3   Title  Patient will report no pain with intercourse to demonstrate improved functional ability.    Baseline  Pt. Having significant pain with intercourse    Time  10    Period  Weeks    Status  New    Target Date  05/25/19      PT LONG TERM GOAL #4   Title  Pt will report ability to work in her yard for greater than 30 minutes without extreme fatigue    Baseline  limited to 10-15 minutes before pt requires ending activity     Time  10    Period  Weeks    Status  New    Target Date  05/25/19      PT LONG TERM GOAL #5   Title  Pt. will be able to go 2-3 hours between emptying her bladder without increased pain and empty her bladder fully whether by urostomy, cath, or voluntary release.    Baseline  Pt. has a urostomy but continues to have to self-catheterize. Has pain with bladder filling and when using catheter.    Time  10    Period  Weeks    Status  New    Target Date  05/25/19      Additional Long Term Goals   Additional Long Term Goals  Yes      PT LONG TERM GOAL #6   Title  Patient will report having BM's at least every-other day with consistency between [facility] stool scale 3-5 over the prior week to demonstrate decreased constipation.    Baseline  Pt. unable to have regula BM's without medication, manometry shows PFM dysfunction    Time  10    Period  Weeks    Status  New    Target Date  05/25/19            Plan - 05/02/19 1204    Clinical Impression Statement  Pt. Responded well to all interventions today, demonstrating decreased spasm and improved rotational ROM as well as understanding and correct performance of all education and exercises provided today.  They will continue to benefit from skilled physical therapy to work toward remaining goals and maximize function as well as decrease likelihood of symptom increase or recurrence.     PT Next Visit Plan  re-assess pelvic alignment and thoracic rotation ROM B, review how TENS worked, further TPDN to R hip-flexors/adductors TPDN to B hamstrings? begin strengthening for reciprocal inhibition. Internal pelvic exam,    PT Home Exercise Plan  diaphragmatic breathing, seated posture (lumbar support and avoiding adduction, low back stretch, side-stretch, hamstring stretch, butterfly stretch, bow-and-arrow, TENS trial, squatty potty, bowel retraining, toileting tips, colonic massage.    Consulted and Agree with Plan of Care  Patient        Patient will benefit from skilled therapeutic intervention in order to improve the following deficits and impairments:     Visit Diagnosis: No diagnosis found.     Problem List Patient Active Problem List   Diagnosis Date Noted  . Seizure (HCC) 11/11/2018  . Major depressive disorder, recurrent episode, moderate (HCC) 02/07/2018  . Nephrolithiasis 04/16/2016  . Numbness 07/28/2015  . Bladder retention 06/23/2015  . Abdominal pain 06/04/2015  . Dizziness 05/18/2015  . Neck pain 05/18/2015  . Complicated migraine 04/28/2015  . Other fatigue 04/28/2015  . Abnormal finding on MRI of brain 04/28/2015  . D (diarrhea) 03/29/2015  . H/O disease 03/29/2015  . Abnormal weight loss 03/29/2015  . Muscle weakness (generalized) 03/14/2015  . Headache, migraine 03/10/2015   Cleophus Molt DPT, ATC Cleophus Molt 05/02/2019, 12:08 PM  [place] [facility] MAIN [facility] SERVICES [address] [place], [place], [address] Phone: [phone]   Fax:  [phone]  Name: Christine Chavez MRN: [id number] Date of Birth: March 09, 1981

## 2019-05-06 ENCOUNTER — Other Ambulatory Visit: Payer: Self-pay

## 2019-05-06 ENCOUNTER — Ambulatory Visit: Payer: BC Managed Care – PPO

## 2019-05-06 DIAGNOSIS — M62838 Other muscle spasm: Secondary | ICD-10-CM

## 2019-05-06 DIAGNOSIS — M6281 Muscle weakness (generalized): Secondary | ICD-10-CM

## 2019-05-06 DIAGNOSIS — R262 Difficulty in walking, not elsewhere classified: Secondary | ICD-10-CM

## 2019-05-06 NOTE — Therapy (Signed)
[name] MAIN [facility] SERVICES [address] [place], [place], [address] Phone: [phone]   Fax:  [phone]  Physical Therapy Treatment  The patient has been informed of current processes in place at Outpatient Rehab to protect patients from Covid-19 exposure including social distancing, schedule modifications, and new cleaning procedures. After discussing their particular risk with a therapist based on the patient's personal risk factors, the patient has decided to proceed with in-person therapy.   Patient Details  Name: Christine Chavez MRN: [id number] Date of Birth: 1980/06/01 Referring Provider (PT): Christine Chavez   Encounter Date: 05/06/2019    Past Medical History:  Diagnosis Date  . Complication of anesthesia    ? seizures after anesthesia   . Headache   . Migraines   . Neurogenic bladder   . Renal disorder   . Vision abnormalities     Past Surgical History:  Procedure Laterality Date  . ANTERIOR CRUCIATE LIGAMENT REPAIR  1997  . APPENDECTOMY    . COLONOSCOPY WITH PROPOFOL N/A 11/11/2018   Procedure: COLONOSCOPY WITH PROPOFOL;  Surgeon: Christine Deem, MD;  Location: [facility] ENDOSCOPY;  Service: Endoscopy;  Laterality: N/A;  . CYSTOSCOPY WITH STENT PLACEMENT Right 04/17/2016   Procedure: CYSTOSCOPY WITH STENT PLACEMENT;  Surgeon: Christine Gauze, MD;  Location: [facility] ORS;  Service: Urology;  Laterality: Right;  . ESOPHAGOGASTRODUODENOSCOPY (EGD) WITH PROPOFOL N/A 11/11/2018   Procedure: ESOPHAGOGASTRODUODENOSCOPY (EGD) WITH PROPOFOL;  Surgeon: Christine Deem, MD;  Location: [facility] ENDOSCOPY;  Service: Endoscopy;  Laterality: N/A;  . EXPLORATORY LAPAROTOMY  1999  . KIDNEY STONE SURGERY  04/2016  . REVISION UROSTOMY CUTANEOUS    . REVISION UROSTOMY CUTANEOUS  01/10/2018  . SUPRAPUBIC CATHETER PLACEMENT  08/2017  . TONSILLECTOMY      There were no vitals filed for this visit.    Pelvic Floor Physical  Therapy Treatment Note  SCREENING  Changes in medications, allergies, or medical history?: none  SUBJECTIVE  Patient reports: Is tired today, still recovering from her daughter's birthday party. She has had "low back pain" and still is having pain when rotating right that can take her breath away. Has not had as bad of bladder spasms over the past few days. Some discomfort still. Her MD said she will increase her Lyrica again when we start internal release to allow for maximal effect. Keeps expecting that the spasms are going to start buck up, is nervous but it is good. Soreness from needling lasted a little longer this last time but went away. She way doing more on Saturday too with baking, also started her period. Still using TENS but did not use it much over the weekend because of the party. Will try to implement bowel retraining more this week.   Precautions:  Neurological diagnosis pending, has "non-specific" brain lesions   Pain update:  Location of pain: R QL/ kidney area and near urethra. Current pain:  4/10  Max pain:  6-7/10 (when it spasms) Least pain:  1/10 Nature of pain: sharp to dull ache   Patient Goals: Get rid of the discomfort with catheterizing and ache/bladder pain. Be able to take fewer medications and be able to enjoy spending time with her family again, doing "all the mom things"    OBJECTIVE  Changes in: Range of Motion/Flexibilty:  (from 12/10) Spine: ~ 2-3 fingers from knee with SB B. ~ 50% reduced R rotation with stretch in L, ~ 75% reduced L rotation with increased tightness. Forward bend:  19 in.from floor. Hips: Unable to achieve neutral pelvis in long-sitting due to hamstring tightness.  Today: R rotation improved to ~ 20% restricted motion with sharp pain present but not until later and it is higher up towards the shoulder. Able to long-sit without UE support but remains ~ 5-10 degrees from upright (90 deg.hip flexion) due to HS tension following  treatment. Pt. Reports feeling stretch but not sharp sensation with HS stretch that was present pre-treatment.    Strength/MMT:  LE MMT (from 12-10) LE MMT Left Right  Hip flex:  (L2) /5 /5  Hip ext: 3+/5 3+/5  Hip abd: 4/5 4/5  Hip add: 4/5 3+/5  Hip IR 4/5 3+/5  Hip ER 4/5 4/5   Posture/Observations:  ASIS and PSIS level in standing, Pt. Appears more confident in standing and acknowledges that she does feel less unsteady though she still has very low endurance.  Pelvic floor:  Palpation: Today: TTP to erector spinae at L ~ T8-10 and to B medial hamstrings.   TTP to OI R Glute min, Piriformis, adductors and hip-flexors (from 12-10)   Gait Analysis:  INTERVENTIONS THIS SESSION: Manual: Performed STM and TP release to toerector spinae at L ~ T8-10 and to B medial hamstrings to decrease spasm and pain, and allow for decreased PFM and bladder spasms and improved pelvic alignment and balance of musculature for improved function and decreased symptoms.  Dry-needle: Performed TPDN with a .30x126mm needle and standard approach as described below to decrease spasm and pain and allow for improved balance of musculature for improved function and decreased symptoms.   Total time: 60                    Trigger Point Dry Needling - 05/06/19 0001    Consent Given?  Yes    Education Handout Provided  No    Muscles Treated Lower Quadrant  Hamstring    Hamstring Response  Twitch response elicited;Palpable increased muscle length             PT Short Term Goals - 03/16/19 1421      PT SHORT TERM GOAL #1   Title  Patient will demonstrate coordinated diaphragmatic breathing with pelvic tilts to demonstrate improved control of diaphragm and TA, to allow for further strengthening of core musculature and decreased pelvic floor spasm.    Baseline  Pt. demonstrates breathing dysfunction and poor PFM coordination evidenced by anal manometry    Time  5    Period  Weeks     Status  New    Target Date  04/20/19      PT SHORT TERM GOAL #2   Title  Patient will report a reduction in pain to no greater than 6/10 over the prior week to demonstrate symptom improvement.    Baseline  Pain is 10/10 at worst, 2/10 at best    Time  5    Period  Weeks    Status  New    Target Date  04/20/19      PT SHORT TERM GOAL #3   Title  Patient will demonstrate HEP x1 in the clinic to demonstrate understanding and proper form to allow for further improvement.    Baseline  Pt. lacks knowledge of therepeutic exercises that can decrease her pain/Sx.    Time  5    Period  Weeks    Status  New    Target Date  04/20/19      PT SHORT TERM  GOAL #4   Title  Patient will report consistent use of foot-stool (squatty-potty) for positioning with BM to decrease pain with BM and intra-abdominal pressure.    Baseline  Pt. having constipation due to PFM dysfunction    Time  5    Period  Weeks    Status  New    Target Date  04/20/19      PT SHORT TERM GOAL #5   Baseline  --        PT Long Term Goals - 03/16/19 1429      PT LONG TERM GOAL #1   Title  Pt. will be able to participate in regular ADL's with least restrictive device without pain increasing greater than 2/10    Baseline  Pt. limited in her ability to perform household duties by increased pain and fatigue.    Time  10    Period  Weeks    Status  New    Target Date  05/25/19      PT LONG TERM GOAL #2   Title  Patient will score at or below 65/300  on the PFDI and 35% on the Female NIH-CPSI to demonstrate a clinically meaningful decrease in disability and distress due to pelvic floor dysfunction.    Baseline  PFDI: 110/300, Female NIH-CPSI: 29/43 (67%)    Time  10    Period  Weeks    Status  New    Target Date  05/25/19      PT LONG TERM GOAL #3   Title  Patient will report no pain with intercourse to demonstrate improved functional ability.    Baseline  Pt. Having significant pain with intercourse    Time  10     Period  Weeks    Status  New    Target Date  05/25/19      PT LONG TERM GOAL #4   Title  Pt will report ability to work in her yard for greater than 30 minutes without extreme fatigue    Baseline  limited to 10-15 minutes before pt requires ending activity    Time  10    Period  Weeks    Status  New    Target Date  05/25/19      PT LONG TERM GOAL #5   Title  Pt. will be able to go 2-3 hours between emptying her bladder without increased pain and empty her bladder fully whether by urostomy, cath, or voluntary release.    Baseline  Pt. has a urostomy but continues to have to self-catheterize. Has pain with bladder filling and when using catheter.    Time  10    Period  Weeks    Status  New    Target Date  05/25/19      Additional Long Term Goals   Additional Long Term Goals  Yes      PT LONG TERM GOAL #6   Title  Patient will report having BM's at least every-other day with consistency between [facility] stool scale 3-5 over the prior week to demonstrate decreased constipation.    Baseline  Pt. unable to have regula BM's without medication, manometry shows PFM dysfunction    Time  10    Period  Weeks    Status  New    Target Date  05/25/19            Plan - 05/06/19 0946    Clinical Impression Statement  Pt. Responded well to all interventions today,  demonstrating improved pelvic alignment, increased hip flexion ROM, and decreased spasm as well as understanding and correct performance of all education and exercises provided today. They will continue to benefit from skilled physical therapy to work toward remaining goals and maximize function as well as decrease likelihood of symptom increase or recurrence.     PT Next Visit Plan  continue to work on thoracolumbar rotation ROM and decreasing spasms, add SLS balance training, begin strengthening for reciprocal inhibition. Internal pelvic exam,    PT Home Exercise Plan  diaphragmatic breathing, seated posture (lumbar support and  avoiding adduction, low back stretch, side-stretch, hamstring stretch, butterfly stretch, bow-and-arrow, TENS trial, squatty potty, bowel retraining, toileting tips, colonic massage.    Consulted and Agree with Plan of Care  Patient       Patient will benefit from skilled therapeutic intervention in order to improve the following deficits and impairments:     Visit Diagnosis: Other muscle spasm  Muscle weakness (generalized)  Difficulty in walking, not elsewhere classified     Problem List Patient Active Problem List   Diagnosis Date Noted  . Seizure ([place]) 11/11/2018  . Major depressive disorder, recurrent episode, moderate ([place]) 02/07/2018  . Nephrolithiasis 04/16/2016  . Numbness 07/28/2015  . Bladder retention 06/23/2015  . Abdominal pain 06/04/2015  . Dizziness 05/18/2015  . Neck pain 05/18/2015  . Complicated migraine 37/01/6268  . Other fatigue 04/28/2015  . Abnormal finding on MRI of brain 04/28/2015  . D (diarrhea) 03/29/2015  . H/O disease 03/29/2015  . Abnormal weight loss 03/29/2015  . Muscle weakness (generalized) 03/14/2015  . Headache, migraine 03/10/2015   Willa Rough DPT, ATC Willa Rough 05/06/2019, 11:03 AM  [place] MAIN [facility] SERVICES [address] [place], [place], [address] Phone: [phone]   Fax:  [phone]  Name: Christine Chavez MRN: [id number] Date of Birth: [birth year]

## 2019-05-08 ENCOUNTER — Ambulatory Visit: Payer: BC Managed Care – PPO

## 2019-05-08 ENCOUNTER — Other Ambulatory Visit: Payer: Self-pay

## 2019-05-08 DIAGNOSIS — M62838 Other muscle spasm: Secondary | ICD-10-CM | POA: Diagnosis not present

## 2019-05-08 DIAGNOSIS — M6281 Muscle weakness (generalized): Secondary | ICD-10-CM

## 2019-05-08 DIAGNOSIS — R262 Difficulty in walking, not elsewhere classified: Secondary | ICD-10-CM

## 2019-05-08 NOTE — Therapy (Signed)
[place] MAIN [facility] SERVICES [address] [place], [place], [address] Phone: [phone]   Fax:  [phone]  Physical Therapy Treatment  The patient has been informed of current processes in place at Outpatient Rehab to protect patients from Covid-19 exposure including social distancing, schedule modifications, and new cleaning procedures. After discussing their particular risk with a therapist based on the patient's personal risk factors, the patient has decided to proceed with in-person therapy.   Patient Details  Name: Christine Chavez MRN: [id number] Date of Birth: Dec 04, 1980 Referring Provider (PT): Christine Chavez   Encounter Date: 05/08/2019  PT End of Session - 05/08/19 1338    Visit Number  12    Number of Visits  20    Date for PT Re-Evaluation  05/21/19    Authorization Type  [employer]    Authorization Time Period  from 03/12/2019 through 05/21/2019    Authorization - Visit Number  12    Authorization - Number of Visits  20    PT Start Time  1010    PT Stop Time  1110    PT Time Calculation (min)  60 min    Activity Tolerance  No increased pain;Patient tolerated treatment well    Behavior During Therapy  [facility] for tasks assessed/performed       Past Medical History:  Diagnosis Date  . Complication of anesthesia    ? seizures after anesthesia   . Headache   . Migraines   . Neurogenic bladder   . Renal disorder   . Vision abnormalities     Past Surgical History:  Procedure Laterality Date  . ANTERIOR CRUCIATE LIGAMENT REPAIR  1997  . APPENDECTOMY    . COLONOSCOPY WITH PROPOFOL N/A 11/11/2018   Procedure: COLONOSCOPY WITH PROPOFOL;  Surgeon: [name], MD;  Location: [facility] ENDOSCOPY;  Service: Endoscopy;  Laterality: N/A;  . CYSTOSCOPY WITH STENT PLACEMENT Right 04/17/2016   Procedure: CYSTOSCOPY WITH STENT PLACEMENT;  Surgeon: [name], MD;  Location: [facility] ORS;  Service: Urology;  Laterality:  Right;  . ESOPHAGOGASTRODUODENOSCOPY (EGD) WITH PROPOFOL N/A 11/11/2018   Procedure: ESOPHAGOGASTRODUODENOSCOPY (EGD) WITH PROPOFOL;  Surgeon: [name], MD;  Location: [facility] ENDOSCOPY;  Service: Endoscopy;  Laterality: N/A;  . EXPLORATORY LAPAROTOMY  1999  . KIDNEY STONE SURGERY  04/2016  . REVISION UROSTOMY CUTANEOUS    . REVISION UROSTOMY CUTANEOUS  01/10/2018  . SUPRAPUBIC CATHETER PLACEMENT  08/2017  . TONSILLECTOMY      There were no vitals filed for this visit.   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   SUBJECTIVE  Patient reports: Her pelvic spasms have still not been as bad, had some but still less. She has been having back pain still, is hurting most with R rotation. Some with L rotation as well.   She feels sometimes like her whole body is just tight. She has had visual fatigue that started a few months before her major incident that led to neurological consults. Has times she can see well and other times when she has a hard time seeing.   Precautions:  Neurological diagnosis pending, has "non-specific" brain lesions   Pain update:  Location of pain: mid-back (pelvic area) Current pain:  2/10 (3/10, increases with motion) Max pain:  5/10 (when it spasms) Least pain:  1/10 Nature of pain: sharp to dull ache   Patient Goals:  Get rid of the discomfort with catheterizing and ache/bladder pain. Be able to take fewer medications and be able to enjoy spending time with her family again, doing "all the mom things"    OBJECTIVE  Changes in: Range of Motion/Flexibilty:  (from 12/10) Spine: ~ 2-3 fingers from knee with SB B. ~ 50% reduced R rotation with stretch in L, ~ 75% reduced L rotation with increased tightness. Forward bend: 19 in.from floor. Hips: Unable to achieve neutral pelvis in long-sitting due to hamstring  tightness.  Today: decreased thoracic EXT ROM, ~ 20% reduced ROT B with pain on L during B rotation.   Strength/MMT:  LE MMT (from 12-10) LE MMT Left Right  Hip flex:  (L2) /5 /5  Hip ext: 3+/5 3+/5  Hip abd: 4/5 4/5  Hip add: 4/5 3+/5  Hip IR 4/5 3+/5  Hip ER 4/5 4/5   Posture/Observations:  Using lumbar support today but upper kyphotic posture and FHP notable.  Pelvic floor:  Palpation: Today: TTP to erector spinae at L ~ T10-L3 and at multifidus through ~ T8-10   TTP to OI R Glute min, Piriformis, adductors and hip-flexors (from 12-10)   Gait Analysis:  INTERVENTIONS THIS SESSION: Manual: Performed STM and TP release to to erector spinae at L ~ T10-L3 and at multifidus through ~ T8-10 to decrease spasm and pain, and allow for decreased PFM and bladder spasms and improved pelvic alignment and balance of musculature for improved function and decreased symptoms.  Dry-needle: Performed TPDN with a .30x74mm needle and standard approach as described below to decrease spasm and pain and allow for improved balance of musculature for improved function and decreased symptoms.   Total time: 60                    Trigger Point Dry Needling - 05/08/19 0001    Consent Given?  Yes    Education Handout Provided  No    Muscles Treated Back/Hip  Erector spinae;Lumbar multifidi;Thoracic multifidi    Dry Needling Comments  B at ~ T10 and erectors at ~ L1    Statistician Performed with Dry Needling  Yes    E-stim with Dry Needling Details  --   5 min. at strong tingle and 100Hz  then 5 min. at ~ 70Hz .   Other Dry Needling  Improved tolerance to Tx. with improved ease of breathing.    Erector spinae Response  Twitch response elicited;Palpable increased muscle length    Lumbar multifidi Response  Twitch response elicited;Palpable increased muscle length    Thoracic multifidi response  Twitch response elicited;Palpable increased muscle length              PT Short Term Goals - 03/16/19 1421      PT SHORT TERM GOAL #1   Title  Patient will demonstrate coordinated diaphragmatic breathing with pelvic tilts to demonstrate improved control of diaphragm and TA, to allow for further strengthening of core musculature and decreased pelvic floor spasm.    Baseline  Pt. demonstrates breathing dysfunction and poor PFM coordination evidenced by anal manometry    Time  5    Period  Weeks    Status  New    Target Date  04/20/19      PT SHORT TERM GOAL #2   Title  Patient will report a reduction in pain to no greater than 6/10 over the prior week to demonstrate symptom improvement.    Baseline  Pain is 10/10 at worst,  2/10 at best    Time  5    Period  Weeks    Status  New    Target Date  04/20/19      PT SHORT TERM GOAL #3   Title  Patient will demonstrate HEP x1 in the clinic to demonstrate understanding and proper form to allow for further improvement.    Baseline  Pt. lacks knowledge of therepeutic exercises that can decrease her pain/Sx.    Time  5    Period  Weeks    Status  New    Target Date  04/20/19      PT SHORT TERM GOAL #4   Title  Patient will report consistent use of foot-stool (squatty-potty) for positioning with BM to decrease pain with BM and intra-abdominal pressure.    Baseline  Pt. having constipation due to PFM dysfunction    Time  5    Period  Weeks    Status  New    Target Date  04/20/19      PT SHORT TERM GOAL #5   Baseline  --        PT Long Term Goals - 03/16/19 1429      PT LONG TERM GOAL #1   Title  Pt. will be able to participate in regular ADL's with least restrictive device without pain increasing greater than 2/10    Baseline  Pt. limited in her ability to perform household duties by increased pain and fatigue.    Time  10    Period  Weeks    Status  New    Target Date  05/25/19      PT LONG TERM GOAL #2   Title  Patient will score at or below 65/300  on the PFDI and 35% on the  Female NIH-CPSI to demonstrate a clinically meaningful decrease in disability and distress due to pelvic floor dysfunction.    Baseline  PFDI: 110/300, Female NIH-CPSI: 29/43 (67%)    Time  10    Period  Weeks    Status  New    Target Date  05/25/19      PT LONG TERM GOAL #3   Title  Patient will report no pain with intercourse to demonstrate improved functional ability.    Baseline  Pt. Having significant pain with intercourse    Time  10    Period  Weeks    Status  New    Target Date  05/25/19      PT LONG TERM GOAL #4   Title  Pt will report ability to work in her yard for greater than 30 minutes without extreme fatigue    Baseline  limited to 10-15 minutes before pt requires ending activity    Time  10    Period  Weeks    Status  New    Target Date  05/25/19      PT LONG TERM GOAL #5   Title  Pt. will be able to go 2-3 hours between emptying her bladder without increased pain and empty her bladder fully whether by urostomy, cath, or voluntary release.    Baseline  Pt. has a urostomy but continues to have to self-catheterize. Has pain with bladder filling and when using catheter.    Time  10    Period  Weeks    Status  New    Target Date  05/25/19      Additional Long Term Goals   Additional Long Term Goals  Yes      PT LONG TERM GOAL #6   Title  Patient will report having BM's at least every-other day with consistency between [facility] stool scale 3-5 over the prior week to demonstrate decreased constipation.    Baseline  Pt. unable to have regula BM's without medication, manometry shows PFM dysfunction    Time  10    Period  Weeks    Status  New    Target Date  05/25/19            Plan - 05/08/19 1339    Clinical Impression Statement  Pt. Responded well to all interventions today, demonstrating improved pain and spasm, pain-free ROM into R Rot, as well as understanding and correct performance of all education and exercises provided today. They will continue to  benefit from skilled physical therapy to work toward remaining goals and maximize function as well as decrease likelihood of symptom increase or recurrence.     PT Next Visit Plan  follow-up on TPDN with TENS success and continue to work on thoracolumbar rotation ROM and decreasing spasms, add SLS balance training, begin strengthening for reciprocal inhibition. Internal pelvic exam,    PT Home Exercise Plan  diaphragmatic breathing, seated posture (lumbar support and avoiding adduction, low back stretch, side-stretch, hamstring stretch, butterfly stretch, bow-and-arrow, TENS trial, squatty potty, bowel retraining, toileting tips, colonic massage, thoracic extensions and supine chest stretch with towel roll.    Consulted and Agree with Plan of Care  Patient       Patient will benefit from skilled therapeutic intervention in order to improve the following deficits and impairments:     Visit Diagnosis: Other muscle spasm  Muscle weakness (generalized)  Difficulty in walking, not elsewhere classified     Problem List Patient Active Problem List   Diagnosis Date Noted  . Seizure ([facility]) 11/11/2018  . Major depressive disorder, recurrent episode, moderate ([facility]) 02/07/2018  . Nephrolithiasis 04/16/2016  . Numbness 07/28/2015  . Bladder retention 06/23/2015  . Abdominal pain 06/04/2015  . Dizziness 05/18/2015  . Neck pain 05/18/2015  . Complicated migraine 04/28/2015  . Other fatigue 04/28/2015  . Abnormal finding on MRI of brain 04/28/2015  . D (diarrhea) 03/29/2015  . H/O disease 03/29/2015  . Abnormal weight loss 03/29/2015  . Muscle weakness (generalized) 03/14/2015  . Headache, migraine 03/10/2015   Cleophus Molt DPT, ATC Cleophus Molt 05/08/2019, 1:59 PM  [facility] MAIN [facility] SERVICES [address] [place], [place], [address] Phone: [phone]   Fax:  [phone]  Name: Christine Chavez MRN: [id number] Date of Birth:  November 21, 1980

## 2019-05-08 NOTE — Patient Instructions (Signed)
   Place foam roller or towel under your upper back between your shoulder blades. Support your head with your hands, elbows forward, and gently rock back and forth and side to side to improve motion in your back ~ 1x10 followed by relaxing back over the towel and just breathing until you feel relaxation/decreased pain at that location.   Move the foam roller or towel up and down to a few spots in the upper back, repeating the process as needed.     Sit at the end of the foam roller or towel roll and lie back with your spine along the length of it. Bring your arms out to the side, palms up, and take _10__ belly breaths.  Next, slide arms up overhead doing a "snow angel" motion as you breathe in and down toward your hips as you breathe out. Do this for __10_ belly breaths.

## 2019-05-09 ENCOUNTER — Encounter: Payer: Self-pay | Admitting: Psychiatry

## 2019-05-09 ENCOUNTER — Ambulatory Visit (INDEPENDENT_AMBULATORY_CARE_PROVIDER_SITE_OTHER): Payer: BC Managed Care – PPO | Admitting: Psychiatry

## 2019-05-09 DIAGNOSIS — F332 Major depressive disorder, recurrent severe without psychotic features: Secondary | ICD-10-CM | POA: Diagnosis not present

## 2019-05-09 DIAGNOSIS — F5101 Primary insomnia: Secondary | ICD-10-CM | POA: Diagnosis not present

## 2019-05-09 DIAGNOSIS — F419 Anxiety disorder, unspecified: Secondary | ICD-10-CM

## 2019-05-09 MED ORDER — VORTIOXETINE HBR 20 MG PO TABS: 20.0000 mg | ORAL_TABLET | Freq: Every day | ORAL | 0 refills | Status: DC

## 2019-05-09 MED ORDER — BUPROPION HCL ER (SR) 100 MG PO TB12: 100.0000 mg | ORAL_TABLET | Freq: Every morning | ORAL | 0 refills | Status: DC

## 2019-05-09 NOTE — Progress Notes (Signed)
Christine Chavez [id number] November 22, 1980 39 y.o.  Virtual Visit via Telephone Note  I connected with pt on 05/09/19 at 10:00 AM EST by telephone and verified that I am speaking with the correct person using two identifiers.   I discussed the limitations, risks, security and privacy concerns of performing an evaluation and management service by telephone and the availability of in person appointments. I also discussed with the patient that there may be a patient responsible charge related to this service. The patient expressed understanding and agreed to proceed.   I discussed the assessment and treatment plan with the patient. The patient was provided an opportunity to ask questions and all were answered. The patient agreed with the plan and demonstrated an understanding of the instructions.   The patient was advised to call back or seek an in-person evaluation if the symptoms worsen or if the condition fails to improve as anticipated.  I provided 35 minutes of non-face-to-face time during this encounter.  The patient was located at home.  The provider was located at [place].   Thayer Headings, PMHNP   Subjective:   Patient ID:  Christine Chavez is a 39 y.o. (DOB 1980/11/16) female.  Chief Complaint:  Chief Complaint  Patient presents with  . Depression  . Anxiety    HPI [place] presents for follow-up of mood and anxiety. She reports "the last couple of weeks have been pretty rocky for me." She reports that she is having increased depression. Has been crying more. Has had increased isolation. She reports that she has also had significant anxiety. Has anxiety around getting help with transport to multiple medical appointments and transportation for children. Has reached out to her church for assistance and has a meeting next week about this. She frequently feels that she is a burden to others. She reports frequent worry- "I have always been a worrier." Denies  any recent panic attacks.  She reports that her energy has been very low and thinks that Vit D and pain meds contribute to fatigue. She reports that her motivation has been fair- "it just gets overwhelming." She reports that her sleep has been fair and feels tired upon awakening. Appetite has been slightly decreased. Concentration is ok. Denies SI.   Pt asks about pharmacogenetic testing.   Has had an initial health eval with pain management psychologist and will start regular visits in February. Has started pelvic floor PT.   Reports that she tried to take a 1/4 tab of Lorazepam prior to a biopsy.  Knows someone that recently committed suicide.  Denies any h/o seizures.  Past medication Trials: Remeron- Reports sleeping better at 15 mg but feels more lethargic upon awakening compared to 7.5 mg.  Sertraline- Has been on 200 mg for the last month and has not noticed a significant improvement. Cymbalta- Reports increased drowsiness and impaired concentration at 90 mg. Trintellix Rexulti-Increased heart rate and SOB Abilify Gabapentin- Causes drowsiness. Anxiety may have increased when she stopped taking TID. Reports that is did not seem to be effective for pain Lyrica Did not start Lithium since PCP advised against it  Review of Systems:  Review of Systems  Genitourinary:       Reports some slight improvement in bladder spasms  Musculoskeletal: Positive for gait problem.  Psychiatric/Behavioral:       Please refer to HPI   Has established care with a new PCP.   Medications: I have reviewed the patient's current medications.  Current Outpatient Medications  Medication Sig Dispense Refill  . B Complex Vitamins (VITAMIN B COMPLEX 100) INJ Inject as directed.    . belladonna-opium (B&O SUPPRETTES) 16.2-30 MG suppository Place 30 mg rectally every 8 (eight) hours as needed for pain.    . butalbital-acetaminophen-caffeine (FIORICET, ESGIC) 50-325-40 MG tablet Take by mouth.    .  Cyanocobalamin (VITAMIN B-12) 2500 MCG SUBL Take 2,500 mcg by mouth daily.    . ergocalciferol (VITAMIN D2) 1.25 MG (50000 UT) capsule Take by mouth.    . folic acid (FOLVITE) 1 MG tablet Take 1 mg by mouth daily.    Marland Kitchen LINZESS 72 MCG capsule TAKE 1 CAPSULE (72 MCG TOTAL) BY MOUTH ONCE DAILY    . mirabegron ER (MYRBETRIQ) 50 MG TB24 tablet Take 50 mg by mouth daily.     . mirtazapine (REMERON) 15 MG tablet Take 1 tablet (15 mg total) by mouth at bedtime. 90 tablet 1  . OXYCODONE HCL PO Take 5 mg by mouth every 6 (six) hours as needed.    . pantoprazole (PROTONIX) 40 MG tablet Take 40 mg by mouth 2 (two) times daily.     . pregabalin (LYRICA) 75 MG capsule Takes 1 cap po q am and 2 caps po QHS    . vortioxetine HBr (TRINTELLIX) 20 MG TABS tablet Take 1 tablet (20 mg total) by mouth daily. 90 tablet 0  . buPROPion (WELLBUTRIN SR) 100 MG 12 hr tablet Take 1 tablet (100 mg total) by mouth every morning. 30 tablet 0  . gabapentin (NEURONTIN) 300 MG capsule Take 1 capsule (300 mg total) by mouth 2 (two) times daily. (Patient not taking: Reported on 03/21/2019)    . LORazepam (ATIVAN) 0.5 MG tablet Take 1/2-1 tab po qd prn severe anxiety (Patient not taking: Reported on 03/21/2019) 15 tablet 1  . ondansetron (ZOFRAN) 4 MG tablet Take 4 mg by mouth every 8 (eight) hours as needed for nausea or vomiting.    Marland Kitchen oxybutynin (DITROPAN) 5 MG tablet Take 5 mg by mouth 3 (three) times daily.    . promethazine (PHENERGAN) 12.5 MG tablet Take 1 tablet (12.5 mg total) by mouth every 4 (four) hours as needed for nausea or vomiting. 20 tablet 0  . rizatriptan (MAXALT) 5 MG tablet Take 5 mg by mouth as needed.     . Trospium Chloride 60 MG CP24 Take by mouth.     No current facility-administered medications for this visit.    Medication Side Effects: Fatigue  Allergies:  Allergies  Allergen Reactions  . Aspirin Shortness Of Breath, Swelling and Anaphylaxis  . Ibuprofen Shortness Of Breath, Swelling and Anaphylaxis   . Morphine And Related Hives    Past Medical History:  Diagnosis Date  . Complication of anesthesia    ? seizures after anesthesia   . Headache   . Migraines   . Neurogenic bladder   . Renal disorder   . Vision abnormalities     Family History  Problem Relation Age of Onset  . Hypertension Mother   . Atrial fibrillation Father   . Healthy Brother   . Depression Brother   . Arthritis/Rheumatoid Paternal Grandmother   . Healthy Brother     Social History   Socioeconomic History  . Marital status: Married    Spouse name: Not on file  . Number of children: Not on file  . Years of education: Not on file  . Highest education level: Not on file  Occupational History  . Not on file  Tobacco Use  . Smoking status: Never Smoker  . Smokeless tobacco: Never Used  Substance and Sexual Activity  . Alcohol use: No    Alcohol/week: 0.0 standard drinks  . Drug use: No  . Sexual activity: Yes    Birth control/protection: None    Comment: vasectomy  Other Topics Concern  . Not on file  Social History Narrative  . Not on file   Social Determinants of Health   Financial Resource Strain:   . Difficulty of Paying Living Expenses: Not on file  Food Insecurity:   . Worried About Charity fundraiser in the Last Year: Not on file  . Ran Out of Food in the Last Year: Not on file  Transportation Needs:   . Lack of Transportation (Medical): Not on file  . Lack of Transportation (Non-Medical): Not on file  Physical Activity:   . Days of Exercise per Week: Not on file  . Minutes of Exercise per Session: Not on file  Stress:   . Feeling of Stress : Not on file  Social Connections:   . Frequency of Communication with Friends and Family: Not on file  . Frequency of Social Gatherings with Friends and Family: Not on file  . Attends Religious Services: Not on file  . Active Member of Clubs or Organizations: Not on file  . Attends Archivist Meetings: Not on file  . Marital  Status: Not on file  Intimate Partner Violence:   . Fear of Current or Ex-Partner: Not on file  . Emotionally Abused: Not on file  . Physically Abused: Not on file  . Sexually Abused: Not on file    Past Medical History, Surgical history, Social history, and Family history were reviewed and updated as appropriate.   Please see review of systems for further details on the patient's review from today.   Objective:   Physical Exam:  There were no vitals taken for this visit.  Physical Exam Neurological:     Mental Status: She is alert and oriented to person, place, and time.     Cranial Nerves: No dysarthria.  Psychiatric:        Attention and Perception: Attention and perception normal.        Mood and Affect: Mood is anxious and depressed.        Speech: Speech normal.        Behavior: Behavior is cooperative.        Thought Content: Thought content normal. Thought content is not paranoid or delusional. Thought content does not include homicidal or suicidal ideation. Thought content does not include homicidal or suicidal plan.        Cognition and Memory: Cognition and memory normal.        Judgment: Judgment normal.     Comments: Insight intact     Lab Review:     Component Value Date/Time   NA 139 11/12/2018 0520   K 3.4 (L) 11/12/2018 0520   CL 110 11/12/2018 0520   CO2 24 11/12/2018 0520   GLUCOSE 91 11/12/2018 0520   BUN <5 (L) 11/12/2018 0520   CREATININE 0.65 11/12/2018 0520   CALCIUM 8.0 (L) 11/12/2018 0520   PROT 7.2 03/03/2017 2132   ALBUMIN 4.3 03/03/2017 2132   AST 17 03/03/2017 2132   ALT 15 03/03/2017 2132   ALKPHOS 51 03/03/2017 2132   BILITOT 1.7 (H) 03/03/2017 2132   GFRNONAA >60 11/12/2018 0520   GFRAA >60 11/12/2018 5003  Component Value Date/Time   WBC 7.2 11/12/2018 0520   RBC 3.59 (L) 11/12/2018 0520   HGB 11.2 (L) 11/12/2018 0520   HCT 33.8 (L) 11/12/2018 0520   HCT 31.7 (L) 04/28/2012 0618   PLT 252 11/12/2018 0520   MCV 94.2  11/12/2018 0520   MCH 31.2 11/12/2018 0520   MCHC 33.1 11/12/2018 0520   RDW 13.4 11/12/2018 0520   LYMPHSABS 3.3 03/03/2017 2132   MONOABS 0.6 03/03/2017 2132   EOSABS 0.1 03/03/2017 2132   BASOSABS 0.1 03/03/2017 2132    No results found for: POCLITH, LITHIUM   No results found for: PHENYTOIN, PHENOBARB, VALPROATE, CBMZ   .res Assessment: Plan:   Patient seen for 30 minutes and greater than 50% of visit spent counseling patient regarding possible treatment options for depression.  Also discussed pharmacogenetics testing since this may be helpful due to history of adverse effects with multiple medications.  Also discussed potential benefits, risks, and side effects of Wellbutrin.  Patient agrees to trial of low-dose Wellbutrin and reports that if she has side effects she would then like to proceed with pharmacogenetics testing and will contact office so that she can receive collection kit. Continue Trintellix 20 mg daily for depression and anxiety. Continue mirtazapine 15 mg p.o. nightly since this has been helpful for insomnia. Patient to follow-up in 4 weeks or sooner if clinically indicated. Patient advised to contact office with any questions, adverse effects, or acute worsening in signs and symptoms.  Breniyah was seen today for depression and anxiety.  Diagnoses and all orders for this visit:  Severe episode of recurrent major depressive disorder, without psychotic features ([place]) -     buPROPion (WELLBUTRIN SR) 100 MG 12 hr tablet; Take 1 tablet (100 mg total) by mouth every morning. -     vortioxetine HBr (TRINTELLIX) 20 MG TABS tablet; Take 1 tablet (20 mg total) by mouth daily.  Anxiety disorder, unspecified type -     vortioxetine HBr (TRINTELLIX) 20 MG TABS tablet; Take 1 tablet (20 mg total) by mouth daily.  Primary insomnia    Please see After Visit Summary for patient specific instructions.  Future Appointments  Date Time Provider [place]  05/13/2019  10:30 AM Letitia Libra T, PT [facility] None  05/15/2019 10:00 AM Letitia Libra T, PT [facility] None  05/19/2019 10:30 AM Letitia Libra T, PT [facility] None  05/22/2019  9:00 AM Letitia Libra T, PT [facility] None  05/27/2019  9:30 AM Letitia Libra T, PT [facility] None  05/30/2019 11:00 AM Letitia Libra T, PT [facility] None  06/02/2019  9:30 AM Letitia Libra T, PT [facility] None  06/04/2019 10:00 AM Letitia Libra T, PT [facility] None  06/06/2019  9:30 AM Thayer Headings, PMHNP [facility] None  06/09/2019  9:30 AM Letitia Libra T, PT [facility] None  06/11/2019 10:00 AM Letitia Libra T, PT [facility] None  06/16/2019  9:30 AM Letitia Libra T, PT [facility] None  06/18/2019  9:30 AM Letitia Libra T, PT [facility] None  06/23/2019  9:30 AM Letitia Libra T, PT [facility] None  06/25/2019  9:30 AM Letitia Libra T, PT [facility] None  06/30/2019  9:30 AM Letitia Libra T, PT [facility] None  07/02/2019  9:30 AM Letitia Libra T, PT [facility] None  07/07/2019  9:30 AM Letitia Libra T, PT [facility] None  07/09/2019  9:30 AM Letitia Libra T, PT [facility] None  07/14/2019  9:30 AM Letitia Libra T, PT [facility] None  07/16/2019  9:30 AM Willa Rough, PT  [facility] None    No orders of the defined types were placed in this encounter.     -------------------------------

## 2019-05-13 ENCOUNTER — Other Ambulatory Visit: Payer: Self-pay

## 2019-05-13 ENCOUNTER — Ambulatory Visit: Payer: BC Managed Care – PPO

## 2019-05-13 DIAGNOSIS — M62838 Other muscle spasm: Secondary | ICD-10-CM | POA: Diagnosis not present

## 2019-05-13 DIAGNOSIS — R262 Difficulty in walking, not elsewhere classified: Secondary | ICD-10-CM

## 2019-05-13 DIAGNOSIS — M6281 Muscle weakness (generalized): Secondary | ICD-10-CM

## 2019-05-13 NOTE — Patient Instructions (Signed)
   Lay your hand flat with your pinky-side along your scar and scoop gently up (or diagonal, or horizontal)  toward your head pulling just the skin until you feel a slight discomfort/burning and take deep breaths for at least 2 min. (~ one song) Or until you feel the discomfort release.   Do this daily until you no longer feel discomfort with moderate tension in different directions.    Hip Extension:    Lying face down, raise leg just off floor squeezing glutes. Keep leg straight. Hold 1 count. Lower leg to floor. Do _10__ reps per set, __3_ sets for each leg. Optional: Place small pillow under abdomen if back becomes uncomfortable.

## 2019-05-13 NOTE — Therapy (Signed)
[place] [facility] MAIN [facility] SERVICES [address] [place], [place], [address] Phone: [phone]   Fax:  [phone]  Physical Therapy Treatment  The patient has been informed of current processes in place at Outpatient Rehab to protect patients from Covid-19 exposure including social distancing, schedule modifications, and new cleaning procedures. After discussing their particular risk with a therapist based on the patient's personal risk factors, the patient has decided to proceed with in-person therapy.   Patient Details  Name: Christine Chavez MRN: [id number] Date of Birth: 28-Oct-1980 Referring Provider (PT): [name]   Encounter Date: 05/13/2019  PT End of Session - 05/13/19 1127    Visit Number  13    Number of Visits  20    Date for PT Re-Evaluation  05/21/19    Authorization Type  BCBS    Authorization Time Period  from 03/12/2019 through 05/21/2019    Authorization - Visit Number  13    Authorization - Number of Visits  20    PT Start Time  1030    PT Stop Time  1130    PT Time Calculation (min)  60 min    Activity Tolerance  No increased pain;Patient tolerated treatment well    Behavior During Therapy  [facility] for tasks assessed/performed       Past Medical History:  Diagnosis Date  . Complication of anesthesia    ? seizures after anesthesia   . Headache   . Migraines   . Neurogenic bladder   . Renal disorder   . Vision abnormalities     Past Surgical History:  Procedure Laterality Date  . ANTERIOR CRUCIATE LIGAMENT REPAIR  1997  . APPENDECTOMY    . COLONOSCOPY WITH PROPOFOL N/A 11/11/2018   Procedure: COLONOSCOPY WITH PROPOFOL;  Surgeon: Christena Deem, MD;  Location: [facility] ENDOSCOPY;  Service: Endoscopy;  Laterality: N/A;  . CYSTOSCOPY WITH STENT PLACEMENT Right 04/17/2016   Procedure: CYSTOSCOPY WITH STENT PLACEMENT;  Surgeon: Malen Gauze, MD;  Location: [facility] ORS;  Service: Urology;  Laterality:  Right;  . ESOPHAGOGASTRODUODENOSCOPY (EGD) WITH PROPOFOL N/A 11/11/2018   Procedure: ESOPHAGOGASTRODUODENOSCOPY (EGD) WITH PROPOFOL;  Surgeon: Christena Deem, MD;  Location: [facility] ENDOSCOPY;  Service: Endoscopy;  Laterality: N/A;  . EXPLORATORY LAPAROTOMY  1999  . KIDNEY STONE SURGERY  04/2016  . REVISION UROSTOMY CUTANEOUS    . REVISION UROSTOMY CUTANEOUS  01/10/2018  . SUPRAPUBIC CATHETER PLACEMENT  08/2017  . TONSILLECTOMY      There were no vitals filed for this visit.    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   SUBJECTIVE  Patient reports: Starting on Saturday, she is having bladder spasms intermittently again "hit or miss" with less severity, but is not sure that it is an infection. When she empties her bladder with the catheter it sends a chill/punched in the gut feeling/shooting pain. She is also having spasms/ urge to pee. Back is still bothering her some. She tries to do some standing rotations in the morning, it is still grabbing some.   Precautions:  Neurological diagnosis pending, has "non-specific" brain lesions   Pain update:  Location of pain: mid-back (pelvic area) Current pain:  2/10 (2/10, increases with motion) Max pain:  5/10 (7-8/10 when it spasms) Least pain:  1/10 Nature of pain: sharp to dull ache   Patient Goals: Get rid of the discomfort with catheterizing  and ache/bladder pain. Be able to take fewer medications and be able to enjoy spending time with her family again, doing "all the mom things"    OBJECTIVE  Changes in: Range of Motion/Flexibilty:  (from 12/10) Spine: ~ 2-3 fingers from knee with SB B. ~ 50% reduced R rotation with stretch in L, ~ 75% reduced L rotation with increased tightness. Forward bend: 19 in.from floor. Hips: Unable to achieve neutral pelvis in long-sitting due to hamstring  tightness.  Today: decreased thoracic EXT ROM, ~ 20% reduced ROT B with pain on L during B rotation.   Strength/MMT:  LE MMT (from 12-10) LE MMT Left Right  Hip flex:  (L2) /5 /5  Hip ext: 3+/5 3+/5  Hip abd: 4/5 4/5  Hip add: 4/5 3+/5  Hip IR 4/5 3+/5  Hip ER 4/5 4/5   Posture/Observations:  Pt. Maintaining improved posture even without her lumbar support.  ASIS level in standing.  Pelvic floor:  Palpation: Today: TTP to B Iliacus.   TTP to OI R Glute min, Piriformis, adductors and hip-flexors (from 12-10)   Gait Analysis:  INTERVENTIONS THIS SESSION: E-stim: Performed E-stim via TPDN to B Iliacus at a frequency of 40Hz  for 20 min.  Dry-needle: Performed TPDN with a .30x151mm needle and standard approach as described below to decrease spasm and pain and allow for improved balance of musculature for improved function and decreased symptoms.  Therex: educated on and practiced hip EXT B ~3x8 to improve glute recruitment and allow for reciprocal inhibition of the hip-flexors and decrease bladder and PFM spasm and pain. Use of mod. Verbal and tactile cueing to improve recruitment and firing.    Total time: 50                    Trigger Point Dry Needling - 05/13/19 0001    Consent Given?  Yes    Education Handout Provided  No    Muscles Treated Back/Hip  Iliacus    Dry Needling Comments  B with 2 ipsilateral channels     E-stim with Dry Needling Details  --   40 Hz and intensity of ~6mA.    Iliacus Response  Twitch response elicited;Palpable increased muscle length             PT Short Term Goals - 03/16/19 1421      PT SHORT TERM GOAL #1   Title  Patient will demonstrate coordinated diaphragmatic breathing with pelvic tilts to demonstrate improved control of diaphragm and TA, to allow for further strengthening of core musculature and decreased pelvic floor spasm.    Baseline  Pt. demonstrates breathing dysfunction and poor PFM coordination  evidenced by anal manometry    Time  5    Period  Weeks    Status  New    Target Date  04/20/19      PT SHORT TERM GOAL #2   Title  Patient will report a reduction in pain to no greater than 6/10 over the prior week to demonstrate symptom improvement.    Baseline  Pain is 10/10 at worst, 2/10 at best    Time  5    Period  Weeks    Status  New    Target Date  04/20/19      PT SHORT TERM GOAL #3   Title  Patient will demonstrate HEP x1 in the clinic to demonstrate understanding and proper form to allow for further improvement.    Baseline  Pt. lacks  knowledge of therepeutic exercises that can decrease her pain/Sx.    Time  5    Period  Weeks    Status  New    Target Date  04/20/19      PT SHORT TERM GOAL #4   Title  Patient will report consistent use of foot-stool (squatty-potty) for positioning with BM to decrease pain with BM and intra-abdominal pressure.    Baseline  Pt. having constipation due to PFM dysfunction    Time  5    Period  Weeks    Status  New    Target Date  04/20/19      PT SHORT TERM GOAL #5   Baseline  --        PT Long Term Goals - 03/16/19 1429      PT LONG TERM GOAL #1   Title  Pt. will be able to participate in regular ADL's with least restrictive device without pain increasing greater than 2/10    Baseline  Pt. limited in her ability to perform household duties by increased pain and fatigue.    Time  10    Period  Weeks    Status  New    Target Date  05/25/19      PT LONG TERM GOAL #2   Title  Patient will score at or below 65/300  on the PFDI and 35% on the Female NIH-CPSI to demonstrate a clinically meaningful decrease in disability and distress due to pelvic floor dysfunction.    Baseline  PFDI: 110/300, Female NIH-CPSI: 29/43 (67%)    Time  10    Period  Weeks    Status  New    Target Date  05/25/19      PT LONG TERM GOAL #3   Title  Patient will report no pain with intercourse to demonstrate improved functional ability.    Baseline   Pt. Having significant pain with intercourse    Time  10    Period  Weeks    Status  New    Target Date  05/25/19      PT LONG TERM GOAL #4   Title  Pt will report ability to work in her yard for greater than 30 minutes without extreme fatigue    Baseline  limited to 10-15 minutes before pt requires ending activity    Time  10    Period  Weeks    Status  New    Target Date  05/25/19      PT LONG TERM GOAL #5   Title  Pt. will be able to go 2-3 hours between emptying her bladder without increased pain and empty her bladder fully whether by urostomy, cath, or voluntary release.    Baseline  Pt. has a urostomy but continues to have to self-catheterize. Has pain with bladder filling and when using catheter.    Time  10    Period  Weeks    Status  New    Target Date  05/25/19      Additional Long Term Goals   Additional Long Term Goals  Yes      PT LONG TERM GOAL #6   Title  Patient will report having BM's at least every-other day with consistency between [facility] stool scale 3-5 over the prior week to demonstrate decreased constipation.    Baseline  Pt. unable to have regula BM's without medication, manometry shows PFM dysfunction    Time  10    Period  Weeks  Status  New    Target Date  05/25/19            Plan - 05/13/19 1127    Clinical Impression Statement  Pt. Responded well to all interventions today, demonstrating decreased spasm and improved ability to recruit L>R hip extensors as well as understanding and correct performance of all education and exercises provided today. We discovered that her suprapubic scar is causing tension on nerves and may very well be contributing to the pelvic spasms and pain that she has been experiencing. She will continue to benefit from skilled physical therapy to work toward remaining goals and maximize function as well as decrease likelihood of symptom increase or recurrence.     PT Next Visit Plan  scar-release and cupping at suprapubic  scar!!! TPDN to multifidus through T=L  junction with E-stim at 40hz  and at 2 hz. continue to work on thoracolumbar rotation ROM and decreasing spasms, add SLS balance training, begin strengthening for reciprocal inhibition. Internal pelvic exam,    PT Home Exercise Plan  diaphragmatic breathing, seated posture (lumbar support and avoiding adduction, low back stretch, side-stretch, hamstring stretch, butterfly stretch, bow-and-arrow, TENS trial, squatty potty, bowel retraining, toileting tips, colonic massage, thoracic extensions and supine chest stretch with towel roll, hip EXT in prone.    Consulted and Agree with Plan of Care  Patient       Patient will benefit from skilled therapeutic intervention in order to improve the following deficits and impairments:     Visit Diagnosis: Other muscle spasm  Muscle weakness (generalized)  Difficulty in walking, not elsewhere classified     Problem List Patient Active Problem List   Diagnosis Date Noted  . Seizure ([place]) 11/11/2018  . Major depressive disorder, recurrent episode, moderate ([place]) 02/07/2018  . Nephrolithiasis 04/16/2016  . Numbness 07/28/2015  . Bladder retention 06/23/2015  . Abdominal pain 06/04/2015  . Dizziness 05/18/2015  . Neck pain 05/18/2015  . Complicated migraine 10/93/2355  . Other fatigue 04/28/2015  . Abnormal finding on MRI of brain 04/28/2015  . D (diarrhea) 03/29/2015  . H/O disease 03/29/2015  . Abnormal weight loss 03/29/2015  . Muscle weakness (generalized) 03/14/2015  . Headache, migraine 03/10/2015   Willa Rough DPT, ATC Willa Rough 05/13/2019, 11:31 AM  [place] MAIN [facility] SERVICES [address] [place], [place], [address] Phone: [phone]   Fax:  [phone]  Name: Tyara Dassow MRN: [id number] Date of Birth: 08-16-1980

## 2019-05-14 ENCOUNTER — Ambulatory Visit: Payer: BC Managed Care – PPO

## 2019-05-14 ENCOUNTER — Other Ambulatory Visit: Payer: Self-pay

## 2019-05-14 DIAGNOSIS — R262 Difficulty in walking, not elsewhere classified: Secondary | ICD-10-CM

## 2019-05-14 DIAGNOSIS — M62838 Other muscle spasm: Secondary | ICD-10-CM | POA: Diagnosis not present

## 2019-05-14 DIAGNOSIS — M6281 Muscle weakness (generalized): Secondary | ICD-10-CM

## 2019-05-14 NOTE — Therapy (Signed)
[facility] MAIN [facility] SERVICES [address] [place], [place], [address] Phone: [phone]   Fax:  [phone]  Physical Therapy Treatment  The patient has been informed of current processes in place at Outpatient Rehab to protect patients from Covid-19 exposure including social distancing, schedule modifications, and new cleaning procedures. After discussing their particular risk with a therapist based on the patient's personal risk factors, the patient has decided to proceed with in-person therapy.   Patient Details  Name: Christine Chavez MRN: [id number] Date of Birth: [birth year] Referring Provider (PT): [name]   Encounter Date: 05/14/2019  PT End of Session - 05/14/19 1135    Visit Number  14    Number of Visits  20    Date for PT Re-Evaluation  05/21/19    Authorization Type  BCBS    Authorization Time Period  from 03/12/2019 through 05/21/2019    Authorization - Visit Number  14    Authorization - Number of Visits  20    PT Start Time  0900    PT Stop Time  1000    PT Time Calculation (min)  60 min    Activity Tolerance  No increased pain;Patient tolerated treatment well    Behavior During Therapy  [facility] for tasks assessed/performed       Past Medical History:  Diagnosis Date  . Complication of anesthesia    ? seizures after anesthesia   . Headache   . Migraines   . Neurogenic bladder   . Renal disorder   . Vision abnormalities     Past Surgical History:  Procedure Laterality Date  . ANTERIOR CRUCIATE LIGAMENT REPAIR  1997  . APPENDECTOMY    . COLONOSCOPY WITH PROPOFOL N/A 11/11/2018   Procedure: COLONOSCOPY WITH PROPOFOL;  Surgeon: Christena Deem, MD;  Location: [facility] ENDOSCOPY;  Service: Endoscopy;  Laterality: N/A;  . CYSTOSCOPY WITH STENT PLACEMENT Right 04/17/2016   Procedure: CYSTOSCOPY WITH STENT PLACEMENT;  Surgeon: Malen Gauze, MD;  Location: [facility] ORS;  Service: Urology;  Laterality:  Right;  . ESOPHAGOGASTRODUODENOSCOPY (EGD) WITH PROPOFOL N/A 11/11/2018   Procedure: ESOPHAGOGASTRODUODENOSCOPY (EGD) WITH PROPOFOL;  Surgeon: Christena Deem, MD;  Location: [facility] ENDOSCOPY;  Service: Endoscopy;  Laterality: N/A;  . EXPLORATORY LAPAROTOMY  1999  . KIDNEY STONE SURGERY  04/2016  . REVISION UROSTOMY CUTANEOUS    . REVISION UROSTOMY CUTANEOUS  01/10/2018  . SUPRAPUBIC CATHETER PLACEMENT  08/2017  . TONSILLECTOMY      There were no vitals filed for this visit.    Pelvic Floor Physical Therapy Treatment Note  SCREENING  Changes in medications, allergies, or medical history?: none   SUBJECTIVE  Patient reports: She has been really busy with the kids and having to talk about buying a house since yesterday, has not done exercises last night. No major changes, still having the reduced but present discomfort in the back with rotation.  Pt. Reports that she had H/O sensation of prolapse following the birth of her second child and SUI following the birth of her third child as well as dyspareunia as late as 6 months PP.  Precautions:  Neurological diagnosis pending, has "non-specific" brain lesions   Pain update:  Location of pain: mid-back (pelvic area) Current pain:  2/10 (2/10, increases with motion) Max pain:  5/10 (7-8/10 when it spasms) Least pain:  1/10 Nature of pain: sharp to dull ache   Patient Goals: Get rid of the discomfort with catheterizing and ache/bladder pain.  Be able to take fewer medications and be able to enjoy spending time with her family again, doing "all the mom things"    OBJECTIVE  Changes in: Range of Motion/Flexibilty:  (from 12/10) Spine: ~ 2-3 fingers from knee with SB B. ~ 50% reduced R rotation with stretch in L, ~ 75% reduced L rotation with increased tightness. Forward bend: 19 in.from floor. Hips: Unable to achieve neutral pelvis in long-sitting due to hamstring tightness.   Strength/MMT:  LE MMT (from 12-10) LE MMT Left  Right  Hip flex:  (L2) /5 /5  Hip ext: 3+/5 3+/5  Hip abd: 4/5 4/5  Hip add: 4/5 3+/5  Hip IR 4/5 3+/5  Hip ER 4/5 4/5   Posture/Observations:   Pelvic floor:  Palpation: Today: reduced TTP to R>L iliacus following yesterday's treatment but Pt. Able to feel pulling/stretching connection between fascia/TA laterally at iliac crests and the midline scar during cupping.  Decreased scar mobility along midline suprapubic scar which increases/recreates bladder "twitching" sensation which is a mild version of the intense bladder spasms she often experiences.   TTP to OI R Glute min, Piriformis, adductors and hip-flexors (from 12-10)   Gait Analysis:  INTERVENTIONS THIS SESSION:  Manual: Performed MFR (with static and dynamic cupping) and STM along medial border of B iliac crests and along suprapubic scar to improve fascial, scar, and muscle mobility to decrease tugging on the bladder and pelvic nerves to decrease pain and difficulty with catheterizing.  Total time: 60                            PT Short Term Goals - 03/16/19 1421      PT SHORT TERM GOAL #1   Title  Patient will demonstrate coordinated diaphragmatic breathing with pelvic tilts to demonstrate improved control of diaphragm and TA, to allow for further strengthening of core musculature and decreased pelvic floor spasm.    Baseline  Pt. demonstrates breathing dysfunction and poor PFM coordination evidenced by anal manometry    Time  5    Period  Weeks    Status  New    Target Date  04/20/19      PT SHORT TERM GOAL #2   Title  Patient will report a reduction in pain to no greater than 6/10 over the prior week to demonstrate symptom improvement.    Baseline  Pain is 10/10 at worst, 2/10 at best    Time  5    Period  Weeks    Status  New    Target Date  04/20/19      PT SHORT TERM GOAL #3   Title  Patient will demonstrate HEP x1 in the clinic to demonstrate understanding and proper form to  allow for further improvement.    Baseline  Pt. lacks knowledge of therepeutic exercises that can decrease her pain/Sx.    Time  5    Period  Weeks    Status  New    Target Date  04/20/19      PT SHORT TERM GOAL #4   Title  Patient will report consistent use of foot-stool (squatty-potty) for positioning with BM to decrease pain with BM and intra-abdominal pressure.    Baseline  Pt. having constipation due to PFM dysfunction    Time  5    Period  Weeks    Status  New    Target Date  04/20/19  PT SHORT TERM GOAL #5   Baseline  --        PT Long Term Goals - 03/16/19 1429      PT LONG TERM GOAL #1   Title  Pt. will be able to participate in regular ADL's with least restrictive device without pain increasing greater than 2/10    Baseline  Pt. limited in her ability to perform household duties by increased pain and fatigue.    Time  10    Period  Weeks    Status  New    Target Date  05/25/19      PT LONG TERM GOAL #2   Title  Patient will score at or below 65/300  on the PFDI and 35% on the Female NIH-CPSI to demonstrate a clinically meaningful decrease in disability and distress due to pelvic floor dysfunction.    Baseline  PFDI: 110/300, Female NIH-CPSI: 29/43 (67%)    Time  10    Period  Weeks    Status  New    Target Date  05/25/19      PT LONG TERM GOAL #3   Title  Patient will report no pain with intercourse to demonstrate improved functional ability.    Baseline  Pt. Having significant pain with intercourse    Time  10    Period  Weeks    Status  New    Target Date  05/25/19      PT LONG TERM GOAL #4   Title  Pt will report ability to work in her yard for greater than 30 minutes without extreme fatigue    Baseline  limited to 10-15 minutes before pt requires ending activity    Time  10    Period  Weeks    Status  New    Target Date  05/25/19      PT LONG TERM GOAL #5   Title  Pt. will be able to go 2-3 hours between emptying her bladder without increased  pain and empty her bladder fully whether by urostomy, cath, or voluntary release.    Baseline  Pt. has a urostomy but continues to have to self-catheterize. Has pain with bladder filling and when using catheter.    Time  10    Period  Weeks    Status  New    Target Date  05/25/19      Additional Long Term Goals   Additional Long Term Goals  Yes      PT LONG TERM GOAL #6   Title  Patient will report having BM's at least every-other day with consistency between [facility] stool scale 3-5 over the prior week to demonstrate decreased constipation.    Baseline  Pt. unable to have regula BM's without medication, manometry shows PFM dysfunction    Time  10    Period  Weeks    Status  New    Target Date  05/25/19            Plan - 05/14/19 1136    Clinical Impression Statement  Pt. Responded well to all interventions today, demonstrating improved fascial and scar mobility in the lower abdomen/suprapubic region as well as understanding and correct performance of all education and exercises provided today. They will continue to benefit from skilled physical therapy to work toward remaining goals and maximize function as well as decrease likelihood of symptom increase or recurrence.     PT Next Visit Plan  more scar-release and cupping at suprapubic scar  and through T/L junction!!! TPDN to multifidus through T-L  junction with E-stim at 40hz  and at 2 hz. continue to work on thoracolumbar rotation ROM and decreasing spasms, add SLS balance training, begin strengthening for reciprocal inhibition. Internal pelvic exam,    PT Home Exercise Plan  diaphragmatic breathing, seated posture (lumbar support and avoiding adduction, low back stretch, side-stretch, hamstring stretch, butterfly stretch, bow-and-arrow, TENS trial, squatty potty, bowel retraining, toileting tips, colonic massage, thoracic extensions and supine chest stretch with towel roll, hip EXT in prone.    Consulted and Agree with Plan of Care   Patient       Patient will benefit from skilled therapeutic intervention in order to improve the following deficits and impairments:     Visit Diagnosis: Other muscle spasm  Muscle weakness (generalized)  Difficulty in walking, not elsewhere classified     Problem List Patient Active Problem List   Diagnosis Date Noted  . Seizure ([place]) 11/11/2018  . Major depressive disorder, recurrent episode, moderate ([place]) 02/07/2018  . Nephrolithiasis 04/16/2016  . Numbness 07/28/2015  . Bladder retention 06/23/2015  . Abdominal pain 06/04/2015  . Dizziness 05/18/2015  . Neck pain 05/18/2015  . Complicated migraine 54/65/0354  . Other fatigue 04/28/2015  . Abnormal finding on MRI of brain 04/28/2015  . D (diarrhea) 03/29/2015  . H/O disease 03/29/2015  . Abnormal weight loss 03/29/2015  . Muscle weakness (generalized) 03/14/2015  . Headache, migraine 03/10/2015   Willa Rough DPT, ATC Willa Rough 05/14/2019, 11:45 AM  [place] MAIN [facility] SERVICES [place], [place], [address] Phone: [phone]   Fax:  [phone]  Name: Ioma Chismar MRN: [id number] Date of Birth: 1980-05-19

## 2019-05-15 ENCOUNTER — Ambulatory Visit: Payer: BC Managed Care – PPO

## 2019-05-19 ENCOUNTER — Ambulatory Visit: Payer: BC Managed Care – PPO | Attending: Gastroenterology

## 2019-05-19 ENCOUNTER — Other Ambulatory Visit: Payer: Self-pay

## 2019-05-19 DIAGNOSIS — R262 Difficulty in walking, not elsewhere classified: Secondary | ICD-10-CM | POA: Diagnosis present

## 2019-05-19 DIAGNOSIS — M6281 Muscle weakness (generalized): Secondary | ICD-10-CM | POA: Diagnosis present

## 2019-05-19 DIAGNOSIS — M62838 Other muscle spasm: Secondary | ICD-10-CM

## 2019-05-19 NOTE — Therapy (Signed)
[facility] MAIN [facility] SERVICES [address] [place], [place], [address] Phone: [phone]   Fax:  [phone]  Physical Therapy Treatment  The patient has been informed of current processes in place at Outpatient Rehab to protect patients from Covid-19 exposure including social distancing, schedule modifications, and new cleaning procedures. After discussing their particular risk with a therapist based on the patient's personal risk factors, the patient has decided to proceed with in-person therapy.   Patient Details  Name: Christine Chavez MRN: [id number] Date of Birth: 10-19-1980 Referring Provider (PT): [name]   Encounter Date: 05/19/2019    Past Medical History:  Diagnosis Date  . Complication of anesthesia    ? seizures after anesthesia   . Headache   . Migraines   . Neurogenic bladder   . Renal disorder   . Vision abnormalities     Past Surgical History:  Procedure Laterality Date  . ANTERIOR CRUCIATE LIGAMENT REPAIR  1997  . APPENDECTOMY    . COLONOSCOPY WITH PROPOFOL N/A 11/11/2018   Procedure: COLONOSCOPY WITH PROPOFOL;  Surgeon: Christena Deem, MD;  Location: [facility] ENDOSCOPY;  Service: Endoscopy;  Laterality: N/A;  . CYSTOSCOPY WITH STENT PLACEMENT Right 04/17/2016   Procedure: CYSTOSCOPY WITH STENT PLACEMENT;  Surgeon: Malen Gauze, MD;  Location: [facility] ORS;  Service: Urology;  Laterality: Right;  . ESOPHAGOGASTRODUODENOSCOPY (EGD) WITH PROPOFOL N/A 11/11/2018   Procedure: ESOPHAGOGASTRODUODENOSCOPY (EGD) WITH PROPOFOL;  Surgeon: Christena Deem, MD;  Location: [facility] ENDOSCOPY;  Service: Endoscopy;  Laterality: N/A;  . EXPLORATORY LAPAROTOMY  1999  . KIDNEY STONE SURGERY  04/2016  . REVISION UROSTOMY CUTANEOUS    . REVISION UROSTOMY CUTANEOUS  01/10/2018  . SUPRAPUBIC CATHETER PLACEMENT  08/2017  . TONSILLECTOMY      There were no vitals filed for this visit.    Pelvic Floor Physical  Therapy Treatment Note  SCREENING  Changes in medications, allergies, or medical history?: none   SUBJECTIVE  Patient reports: She has had it pretty rough the lat few days, she thinks her meds are contributing to her fatigue. Her MD thinks it is multifactorial. She feels like she gets up in the morning and does not feel like she ever went to sleep. She spent a lot of time over the weekend on a heating pad from the spasms and GI has been bad. Feels like she can not empty her bowels. Has done the scar mobility. Had no motivation or energy to do much this weekend. Tried to get the glute to fire but had a hard time to work, felt it in her back more than in the front. Had to take a breakthrough spasm med to prepare for therapy.   Precautions:  Neurological diagnosis pending, has "non-specific" brain lesions   Pain update:  Location of pain: mid-back (pelvic area) Current pain:  4/10 (3/10, increases with motion) Max pain:  7/10 (8/10 when it spasms) Least pain:  3/10 (2) Nature of pain: sharp to dull ache   Patient Goals: Get rid of the discomfort with catheterizing and ache/bladder pain. Be able to take fewer medications and be able to enjoy spending time with her family again, doing "all the mom things"    OBJECTIVE  Changes in: Range of Motion/Flexibilty:  (from 12/10) Spine: ~ 2-3 fingers from knee with SB B. ~ 50% reduced R rotation with stretch in L, ~ 75% reduced L rotation with increased tightness. Forward bend: 19 in.from floor. Hips: Unable to achieve neutral  pelvis in long-sitting due to hamstring tightness.  Special tests:  Clonus: 2-3 beats on R 3-4 beats on L Tone: no cog-wheel rigidity noted today (possibly due to increased gabapentin)    Strength/MMT:  LE MMT (from 12-10) LE MMT Left Right  Hip flex:  (L2) /5 /5  Hip ext: 3+/5 3+/5  Hip abd: 4/5 4/5  Hip add: 4/5 3+/5  Hip IR 4/5 3+/5  Hip ER 4/5 4/5   Posture/Observations:   Pelvic  floor:  Palpation: Today: TTP to erector spinae on R>L through ~ T10-L1 with decreased density and which responded faster to treatment than it has at prior sessions. Fascial tension along spine near T-L junction, laterally over lower ribs, along posterolateral waistline, and over QL B.   (from previous session) Decreased scar mobility along midline suprapubic scar which increases/recreates bladder "twitching" sensation which is a mild version of the intense bladder spasms she often experiences.  TTP to OI R Glute min, Piriformis, adductors and hip-flexors (from 12-10)   Gait Analysis:  INTERVENTIONS THIS SESSION:  Manual: Performed MFR (with static and dynamic cupping) and STM along spine near T-L junction, laterally over lower ribs, along posterolateral waistline, and over QL B to improve fascial, scar, and muscle mobility to decrease tugging on the bladder and pelvic nerves to decrease pain and difficulty with catheterizing. Performed TP release to R erector spinae from ~ T10-L1 to decrease spasm and pain and allow for improved balance of musculature for improved function and decreased symptoms. Re-assessed tone in lower extremities with kne flex/EXT at various speeds and with clonus test.   Total time: 57                           PT Short Term Goals - 03/16/19 1421      PT SHORT TERM GOAL #1   Title  Patient will demonstrate coordinated diaphragmatic breathing with pelvic tilts to demonstrate improved control of diaphragm and TA, to allow for further strengthening of core musculature and decreased pelvic floor spasm.    Baseline  Pt. demonstrates breathing dysfunction and poor PFM coordination evidenced by anal manometry    Time  5    Period  Weeks    Status  New    Target Date  04/20/19      PT SHORT TERM GOAL #2   Title  Patient will report a reduction in pain to no greater than 6/10 over the prior week to demonstrate symptom improvement.    Baseline   Pain is 10/10 at worst, 2/10 at best    Time  5    Period  Weeks    Status  New    Target Date  04/20/19      PT SHORT TERM GOAL #3   Title  Patient will demonstrate HEP x1 in the clinic to demonstrate understanding and proper form to allow for further improvement.    Baseline  Pt. lacks knowledge of therepeutic exercises that can decrease her pain/Sx.    Time  5    Period  Weeks    Status  New    Target Date  04/20/19      PT SHORT TERM GOAL #4   Title  Patient will report consistent use of foot-stool (squatty-potty) for positioning with BM to decrease pain with BM and intra-abdominal pressure.    Baseline  Pt. having constipation due to PFM dysfunction    Time  5    Period  Weeks    Status  New    Target Date  04/20/19      PT SHORT TERM GOAL #5   Baseline  --        PT Long Term Goals - 03/16/19 1429      PT LONG TERM GOAL #1   Title  Pt. will be able to participate in regular ADL's with least restrictive device without pain increasing greater than 2/10    Baseline  Pt. limited in her ability to perform household duties by increased pain and fatigue.    Time  10    Period  Weeks    Status  New    Target Date  05/25/19      PT LONG TERM GOAL #2   Title  Patient will score at or below 65/300  on the PFDI and 35% on the Female NIH-CPSI to demonstrate a clinically meaningful decrease in disability and distress due to pelvic floor dysfunction.    Baseline  PFDI: 110/300, Female NIH-CPSI: 29/43 (67%)    Time  10    Period  Weeks    Status  New    Target Date  05/25/19      PT LONG TERM GOAL #3   Title  Patient will report no pain with intercourse to demonstrate improved functional ability.    Baseline  Pt. Having significant pain with intercourse    Time  10    Period  Weeks    Status  New    Target Date  05/25/19      PT LONG TERM GOAL #4   Title  Pt will report ability to work in her yard for greater than 30 minutes without extreme fatigue    Baseline  limited  to 10-15 minutes before pt requires ending activity    Time  10    Period  Weeks    Status  New    Target Date  05/25/19      PT LONG TERM GOAL #5   Title  Pt. will be able to go 2-3 hours between emptying her bladder without increased pain and empty her bladder fully whether by urostomy, cath, or voluntary release.    Baseline  Pt. has a urostomy but continues to have to self-catheterize. Has pain with bladder filling and when using catheter.    Time  10    Period  Weeks    Status  New    Target Date  05/25/19      Additional Long Term Goals   Additional Long Term Goals  Yes      PT LONG TERM GOAL #6   Title  Patient will report having BM's at least every-other day with consistency between [facility] stool scale 3-5 over the prior week to demonstrate decreased constipation.    Baseline  Pt. unable to have regula BM's without medication, manometry shows PFM dysfunction    Time  10    Period  Weeks    Status  New    Target Date  05/25/19              Patient will benefit from skilled therapeutic intervention in order to improve the following deficits and impairments:  Abnormal gait, Decreased balance, Decreased endurance, Decreased mobility, Difficulty walking, Increased muscle spasms, Impaired sensation, Improper body mechanics, Impaired tone, Decreased activity tolerance, Decreased coordination, Decreased strength, Postural dysfunction, Pain  Visit Diagnosis: Other muscle spasm  Muscle weakness (generalized)  Difficulty in walking, not elsewhere classified  Problem List Patient Active Problem List   Diagnosis Date Noted  . Seizure (HCC) 11/11/2018  . Major depressive disorder, recurrent episode, moderate (HCC) 02/07/2018  . Nephrolithiasis 04/16/2016  . Numbness 07/28/2015  . Bladder retention 06/23/2015  . Abdominal pain 06/04/2015  . Dizziness 05/18/2015  . Neck pain 05/18/2015  . Complicated migraine 04/28/2015  . Other fatigue 04/28/2015  . Abnormal  finding on MRI of brain 04/28/2015  . D (diarrhea) 03/29/2015  . H/O disease 03/29/2015  . Abnormal weight loss 03/29/2015  . Muscle weakness (generalized) 03/14/2015  . Headache, migraine 03/10/2015   Cleophus Molt DPT, ATC Cleophus Molt 05/21/2019, 8:54 AM   [facility] MAIN [facility] SERVICES [address] [place], [place], [address] Phone: [phone]   Fax:  [phone]  Name: Christine Chavez MRN: [id number] Date of Birth: 1980/09/17

## 2019-05-22 ENCOUNTER — Other Ambulatory Visit: Payer: Self-pay

## 2019-05-22 ENCOUNTER — Ambulatory Visit: Payer: BC Managed Care – PPO

## 2019-05-22 DIAGNOSIS — R262 Difficulty in walking, not elsewhere classified: Secondary | ICD-10-CM

## 2019-05-22 DIAGNOSIS — M62838 Other muscle spasm: Secondary | ICD-10-CM

## 2019-05-22 DIAGNOSIS — M6281 Muscle weakness (generalized): Secondary | ICD-10-CM

## 2019-05-22 NOTE — Patient Instructions (Addendum)
Intimate Rose internal trigger point release tool $29.99$59.99  Dr. Joel Kaplan Premium Prostate Massager   $19.99 Child's Pose Pelvic Floor Lengthening    Sit in knee-chest position and reach arms forward. Separate knees for comfort. Hold position for _5__ breaths. Repeat _2-3__ times. Do _1-2__ times per day.   

## 2019-05-22 NOTE — Therapy (Addendum)
[facility] MAIN [facility] SERVICES [address] [place], [place], [address] Phone: [phone]   Fax:  [phone]  Physical Therapy Treatment  The patient has been informed of current processes in place at Outpatient Rehab to protect patients from Covid-19 exposure including social distancing, schedule modifications, and new cleaning procedures. After discussing their particular risk with a therapist based on the patient's personal risk factors, the patient has decided to proceed with in-person therapy.   Patient Details  Name: Christine Chavez MRN: [id number] Date of Birth: [birth year] Referring Provider (PT): [name]   Encounter Date: 05/22/2019  PT End of Session - 05/28/19 0867    Number of Visits  20    Date for PT Re-Evaluation  05/21/19    Authorization Type  [employer]    Authorization Time Period  from 03/12/2019 through 05/21/2019    Authorization - Visit Number  [id number]    Authorization - Number of Visits  20    PT Start Time  0900    PT Stop Time  1000    PT Time Calculation (min)  60 min    Activity Tolerance  No increased pain;Patient tolerated treatment well    Behavior During Therapy  [facility] for tasks assessed/performed       Past Medical History:  Diagnosis Date  . Complication of anesthesia    ? seizures after anesthesia   . Headache   . Migraines   . Neurogenic bladder   . Renal disorder   . Vision abnormalities     Past Surgical History:  Procedure Laterality Date  . ANTERIOR CRUCIATE LIGAMENT REPAIR  1997  . APPENDECTOMY    . COLONOSCOPY WITH PROPOFOL N/A 11/11/2018   Procedure: COLONOSCOPY WITH PROPOFOL;  Surgeon: Christena Deem, MD;  [facility]: [facility] ENDOSCOPY;  Service: Endoscopy;  Laterality: N/A;  . CYSTOSCOPY WITH STENT PLACEMENT Right 04/17/2016   Procedure: CYSTOSCOPY WITH STENT PLACEMENT;  Surgeon: Malen Gauze, MD;  Location: [facility] ORS;  Service: Urology;  Laterality: Right;  .  ESOPHAGOGASTRODUODENOSCOPY (EGD) WITH PROPOFOL N/A 11/11/2018   Procedure: ESOPHAGOGASTRODUODENOSCOPY (EGD) WITH PROPOFOL;  Surgeon: Christena Deem, MD;  Location: [facility] ENDOSCOPY;  Service: Endoscopy;  Laterality: N/A;  . EXPLORATORY LAPAROTOMY  1999  . KIDNEY STONE SURGERY  04/2016  . REVISION UROSTOMY CUTANEOUS    . REVISION UROSTOMY CUTANEOUS  01/10/2018  . SUPRAPUBIC CATHETER PLACEMENT  08/2017  . TONSILLECTOMY      There were no vitals filed for this visit.    Pelvic Floor Physical Therapy Treatment Note  SCREENING  Changes in medications, allergies, or medical history?: Has a telephone visit to go over neurology results today.   SUBJECTIVE  Patient reports: She is still really tired, she has struggled with her back and her GI "stuff" has been not good either. Bladder spasms have come and gone. Did not take oxybutynin because of feeling so sluggish. Had her husband rub her back the last couple nights because it has been hurting, sometimes it feels low, sometimes it feels close to her kidneys. Back does not catch as much with R rotation, pain is more on left than right now. Had diarrhea yesterday but it had been 2+ days and she had to take linzess 3 days in a row, then use a suppository to finally have a BM.  Precautions:  Neurological diagnosis pending, has "non-specific" brain lesions   Pain update:  Location of pain: mid-back (pelvic area) Current pain:  5/10 (3/10, increases with  motion) Max pain:  8/10 (8/10 when it spasms) Least pain:  3/10 (2) Nature of pain: sharp to dull ache   Patient Goals: Get rid of the discomfort with catheterizing and ache/bladder pain. Be able to take fewer medications and be able to enjoy spending time with her family again, doing "all the mom things"    OBJECTIVE  Changes in: Range of Motion/Flexibilty:  (from 12/10) Spine: ~ 2-3 fingers from knee with SB B. ~ 50% reduced R rotation with stretch in L, ~ 75% reduced L rotation with  increased tightness. Forward bend: 19 in.from floor. Hips: Unable to achieve neutral pelvis in long-sitting due to hamstring tightness.  Special tests:  (from previous note) Clonus: 2-3 beats on R 3-4 beats on L Tone: no cog-wheel rigidity noted today (possibly due to increased gabapentin)    Strength/MMT:  LE MMT (from 12-10) LE MMT Left Right  Hip flex:  (L2) /5 /5  Hip ext: 3+/5 3+/5  Hip abd: 4/5 4/5  Hip add: 4/5 3+/5  Hip IR 4/5 3+/5  Hip ER 4/5 4/5   Pelvic Floor External Exam: Introitus Appears: elevated Skin integrity: WNL Palpation: TTP to B STP Cough: limited motion Prolapse visible?: no Scar mobility: not assessed  Internal Vaginal Exam: Strength (PERF): Pt. Has difficulty sensing the PFM to generate contraction or relaxation due to high tone/neuro involvement Symmetry: L>R for spasm/TTP Palpation: TTP to all muscles throughout B Prolapse: none   Palpation: Today: TTP to B adductors distally and proximally  (from previous session) Decreased scar mobility along midline suprapubic scar which increases/recreates bladder "twitching" sensation which is a mild version of the intense bladder spasms she often experiences.  TTP to OI R Glute min, Piriformis, adductors and hip-flexors (from 12-10)   Gait Analysis:  INTERVENTIONS THIS SESSION:  Manual:  Performed PFM assessment and TP release to R posterior PR/PC to decrease spasm and pain and allow for improved balance of musculature for improved function and decreased symptoms. Self-care: Educated on tools for self internal TP release and the potential of doing partner training to have her husband help to allow for faster reduction of spasms due to severity and Pt's slow response to manual treatments. Therex: educated on and practiced child's pose To help decrease tension in B adductors and PFM to allow for decreased PFM and bladder spasms and pain.     Total time: 60  min.                            PT Short Term Goals - 03/16/19 1421      PT SHORT TERM GOAL #1   Title  Patient will demonstrate coordinated diaphragmatic breathing with pelvic tilts to demonstrate improved control of diaphragm and TA, to allow for further strengthening of core musculature and decreased pelvic floor spasm.    Baseline  Pt. demonstrates breathing dysfunction and poor PFM coordination evidenced by anal manometry    Time  5    Period  Weeks    Status  New    Target Date  04/20/19      PT SHORT TERM GOAL #2   Title  Patient will report a reduction in pain to no greater than 6/10 over the prior week to demonstrate symptom improvement.    Baseline  Pain is 10/10 at worst, 2/10 at best    Time  5    Period  Weeks    Status  New  Target Date  04/20/19      PT SHORT TERM GOAL #3   Title  Patient will demonstrate HEP x1 in the clinic to demonstrate understanding and proper form to allow for further improvement.    Baseline  Pt. lacks knowledge of therepeutic exercises that can decrease her pain/Sx.    Time  5    Period  Weeks    Status  New    Target Date  04/20/19      PT SHORT TERM GOAL #4   Title  Patient will report consistent use of foot-stool (squatty-potty) for positioning with BM to decrease pain with BM and intra-abdominal pressure.    Baseline  Pt. having constipation due to PFM dysfunction    Time  5    Period  Weeks    Status  New    Target Date  04/20/19      PT SHORT TERM GOAL #5   Baseline  --        PT Long Term Goals - 03/16/19 1429      PT LONG TERM GOAL #1   Title  Pt. will be able to participate in regular ADL's with least restrictive device without pain increasing greater than 2/10    Baseline  Pt. limited in her ability to perform household duties by increased pain and fatigue.    Time  10    Period  Weeks    Status  New    Target Date  05/25/19      PT LONG TERM GOAL #2   Title  Patient will score at or  below 65/300  on the PFDI and 35% on the Female NIH-CPSI to demonstrate a clinically meaningful decrease in disability and distress due to pelvic floor dysfunction.    Baseline  PFDI: 110/300, Female NIH-CPSI: 29/43 (67%)    Time  10    Period  Weeks    Status  New    Target Date  05/25/19      PT LONG TERM GOAL #3   Title  Patient will report no pain with intercourse to demonstrate improved functional ability.    Baseline  Pt. Having significant pain with intercourse    Time  10    Period  Weeks    Status  New    Target Date  05/25/19      PT LONG TERM GOAL #4   Title  Pt will report ability to work in her yard for greater than 30 minutes without extreme fatigue    Baseline  limited to 10-15 minutes before pt requires ending activity    Time  10    Period  Weeks    Status  New    Target Date  05/25/19      PT LONG TERM GOAL #5   Title  Pt. will be able to go 2-3 hours between emptying her bladder without increased pain and empty her bladder fully whether by urostomy, cath, or voluntary release.    Baseline  Pt. has a urostomy but continues to have to self-catheterize. Has pain with bladder filling and when using catheter.    Time  10    Period  Weeks    Status  New    Target Date  05/25/19      Additional Long Term Goals   Additional Long Term Goals  Yes      PT LONG TERM GOAL #6   Title  Patient will report having BM's at least every-other day  with consistency between [facility] stool scale 3-5 over the prior week to demonstrate decreased constipation.    Baseline  Pt. unable to have regula BM's without medication, manometry shows PFM dysfunction    Time  10    Period  Weeks    Status  New    Target Date  05/25/19            Plan - 05/28/19 9030    Clinical Impression Statement  Pt. Responded well to all interventions today, demonstrating some slow but present PFM spasm reduction as well as understanding and correct performance of all education and exercises provided  today. She has significant PFM spasms, as expected, and will require further release internally as well as along her scars to allow for PFM relaxation and decreased pain. They will continue to benefit from skilled physical therapy to work toward remaining goals and maximize function as well as decrease likelihood of symptom increase or recurrence.     Personal Factors and Comorbidities  Comorbidity 3+    Comorbidities  neurologic insult with pending diagnosis, multiple recurrent UTI's, BLE weakness, depression    Examination-Activity Limitations  Caring for Others;Carry;Continence;Squat;Stairs;Toileting;Locomotion Level;Lift;Stand    Examination-Participation Restrictions  Church;Cleaning;Community Activity;Driving;Interpersonal Relationship;Laundry;Yard Work;Meal Prep    Stability/Clinical Decision Making  Unstable/Unpredictable    Rehab Potential  Fair    PT Frequency  2x / week    PT Duration  Other (comment)    PT Treatment/Interventions  ADLs/Self Care Home Management;Aquatic Therapy;Biofeedback;Moist Heat;Electrical Stimulation;Cryotherapy;Traction;Ultrasound;Therapeutic activities;Functional mobility training;Stair training;Gait training;Therapeutic exercise;Balance training;Neuromuscular re-education;Patient/family education;Manual techniques;Dry needling;Scar mobilization;Energy conservation;Taping;Joint Manipulations;Spinal Manipulations;Visual/perceptual remediation/compensation    PT Next Visit Plan  more scar-release and cupping at suprapubic scar, MFR around B ilium. re-assess ROM/back pain. TPDN to multifidus through T-L  junction with E-stim at 40hz  and at 2 hz. continue to work on thoracolumbar rotation ROM and decreasing spasms, add SLS balance training, begin strengthening for reciprocal inhibition. Internal pelvic exam,    PT Home Exercise Plan  diaphragmatic breathing, seated posture (lumbar support and avoiding adduction, low back stretch, side-stretch, hamstring stretch, butterfly  stretch, bow-and-arrow, TENS trial, squatty potty, bowel retraining, toileting tips, colonic massage, thoracic extensions and supine chest stretch with towel roll, hip EXT in prone, child's pose.    Consulted and Agree with Plan of Care  Patient       Patient will benefit from skilled therapeutic intervention in order to improve the following deficits and impairments:  Abnormal gait, Decreased balance, Decreased endurance, Decreased mobility, Difficulty walking, Increased muscle spasms, Impaired sensation, Improper body mechanics, Impaired tone, Decreased activity tolerance, Decreased coordination, Decreased strength, Postural dysfunction, Pain  Visit Diagnosis: Other muscle spasm  Muscle weakness (generalized)  Difficulty in walking, not elsewhere classified     Problem List Patient Active Problem List   Diagnosis Date Noted  . Seizure (HCC) 11/11/2018  . Major depressive disorder, recurrent episode, moderate (HCC) 02/07/2018  . Nephrolithiasis 04/16/2016  . Numbness 07/28/2015  . Bladder retention 06/23/2015  . Abdominal pain 06/04/2015  . Dizziness 05/18/2015  . Neck pain 05/18/2015  . Complicated migraine 04/28/2015  . Other fatigue 04/28/2015  . Abnormal finding on MRI of brain 04/28/2015  . D (diarrhea) 03/29/2015  . H/O disease 03/29/2015  . Abnormal weight loss 03/29/2015  . Muscle weakness (generalized) 03/14/2015  . Headache, migraine 03/10/2015   Cleophus Molt DPT, ATC Cleophus Molt 05/28/2019, 8:44 AM  [facility] MAIN [facility] SERVICES [address] [place], [place], [address] Phone: [phone]  Fax:  [phone]  Name: Christine Chavez MRN: [id number] Date of Birth: July 26, 1980

## 2019-05-27 ENCOUNTER — Ambulatory Visit: Payer: BC Managed Care – PPO

## 2019-05-30 ENCOUNTER — Other Ambulatory Visit: Payer: Self-pay

## 2019-05-30 ENCOUNTER — Ambulatory Visit: Payer: BC Managed Care – PPO

## 2019-05-30 DIAGNOSIS — M62838 Other muscle spasm: Secondary | ICD-10-CM | POA: Diagnosis not present

## 2019-05-30 DIAGNOSIS — R262 Difficulty in walking, not elsewhere classified: Secondary | ICD-10-CM

## 2019-05-30 DIAGNOSIS — M6281 Muscle weakness (generalized): Secondary | ICD-10-CM

## 2019-05-30 NOTE — Therapy (Addendum)
[facility] MAIN [facility] SERVICES [address] [place], [place], [address] Phone: [phone]   Fax:  [phone]  Physical Therapy Treatment  The patient has been informed of current processes in place at Outpatient Rehab to protect patients from Covid-19 exposure including social distancing, schedule modifications, and new cleaning procedures. After discussing their particular risk with a therapist based on the patient's personal risk factors, the patient has decided to proceed with in-person therapy.   Patient Details  Name: Christine Chavez MRN: [id number] Date of Birth: Feb 04, 1981 Referring Provider (PT): Christine Chavez   Encounter Date: 05/30/2019  PT End of Session - 06/02/19 0728    Visit Number  17    Number of Visits  20    Date for PT Re-Evaluation  05/21/19    Authorization Type  [employer]    Authorization Time Period  from 03/12/2019 through 05/21/2019    Authorization - Visit Number  16    Authorization - Number of Visits  20    PT Start Time  1105    PT Stop Time  1205    PT Time Calculation (min)  60 min    Activity Tolerance  No increased pain;Patient tolerated treatment well    Behavior During Therapy  [facility] for tasks assessed/performed       Past Medical History:  Diagnosis Date  . Complication of anesthesia    ? seizures after anesthesia   . Headache   . Migraines   . Neurogenic bladder   . Renal disorder   . Vision abnormalities     Past Surgical History:  Procedure Laterality Date  . ANTERIOR CRUCIATE LIGAMENT REPAIR  1997  . APPENDECTOMY    . COLONOSCOPY WITH PROPOFOL N/A 11/11/2018   Procedure: COLONOSCOPY WITH PROPOFOL;  Surgeon: Christine Deem, MD;  Location: [facility] ENDOSCOPY;  Service: Endoscopy;  Laterality: N/A;  . CYSTOSCOPY WITH STENT PLACEMENT Right 04/17/2016   Procedure: CYSTOSCOPY WITH STENT PLACEMENT;  Surgeon: Christine Gauze, MD;  Location: [facility] ORS;  Service: Urology;  Laterality:  Right;  . ESOPHAGOGASTRODUODENOSCOPY (EGD) WITH PROPOFOL N/A 11/11/2018   Procedure: ESOPHAGOGASTRODUODENOSCOPY (EGD) WITH PROPOFOL;  Surgeon: Christine Deem, MD;  Location: [facility] ENDOSCOPY;  Service: Endoscopy;  Laterality: N/A;  . EXPLORATORY LAPAROTOMY  1999  . KIDNEY STONE SURGERY  04/2016  . REVISION UROSTOMY CUTANEOUS    . REVISION UROSTOMY CUTANEOUS  01/10/2018  . SUPRAPUBIC CATHETER PLACEMENT  08/2017  . TONSILLECTOMY      There were no vitals filed for this visit.   Pelvic Floor Physical Therapy Treatment Note  SCREENING  Changes in medications, allergies, or medical history?: Is taking an antibiotic for her bladder infection.  SUBJECTIVE  Patient reports: She feels like the symptoms were different this time but the urinalysis and culture came back positive for infection and she was put on an antibiotic.  Precautions:  Neurological diagnosis pending, has "non-specific" brain lesions   Pain update:  Location of pain: mid-back (pelvic area) Current pain:  4/10 (3/10, increases with motion) Max pain:  10/10 (8/10 when it spasms) Least pain:  4/10 (2) Nature of pain: sharp to dull ache   Patient Goals: Get rid of the discomfort with catheterizing and ache/bladder pain. Be able to take fewer medications and be able to enjoy spending time with her family again, doing "all the mom things"    OBJECTIVE  Changes in: Range of Motion/Flexibilty:  (from 12/10) Spine: ~ 2-3 fingers from knee with SB  B. ~ 50% reduced R rotation with stretch in L, ~ 75% reduced L rotation with increased tightness. Forward bend: 19 in.from floor. Hips: Unable to achieve neutral pelvis in long-sitting due to hamstring tightness.  Special tests:  (from previous note) Clonus: 2-3 beats on R 3-4 beats on L Tone: no cog-wheel rigidity noted today (possibly due to increased gabapentin)    Strength/MMT:  LE MMT (from 12-10) LE MMT Left Right  Hip flex:  (L2) /5 /5  Hip ext: 3+/5 3+/5   Hip abd: 4/5 4/5  Hip add: 4/5 3+/5  Hip IR 4/5 3+/5  Hip ER 4/5 4/5   Pelvic Floor External Exam: Introitus Appears: elevated Skin integrity: WNL Palpation: TTP to B STP Cough: limited motion Prolapse visible?: no Scar mobility: not assessed  Internal Vaginal Exam: Strength (PERF): Pt. Has difficulty sensing the PFM to generate contraction or relaxation due to high tone/neuro involvement Symmetry: L>R for spasm/TTP Palpation: TTP to all muscles throughout B Prolapse: none   Palpation: Today: decreased fascial mobility and TTP through B iliacus and TA. Decreased mobility to scar.  (from previous session) Decreased scar mobility along midline suprapubic scar which increases/recreates bladder "twitching" sensation which is a mild version of the intense bladder spasms she often experiences.  TTP to OI R Glute min, Piriformis, adductors and hip-flexors (from 12-10)   Gait Analysis:  INTERVENTIONS THIS SESSION:  Manual:  Performed cupping and TP release/STM/MFR to B ilium and cupping along scar to continue to decrease scar restriction and tension on the bladder and allow for decreased pain and spasm.     Total time: 60 min.                             PT Short Term Goals - 03/16/19 1421      PT SHORT TERM GOAL #1   Title  Patient will demonstrate coordinated diaphragmatic breathing with pelvic tilts to demonstrate improved control of diaphragm and TA, to allow for further strengthening of core musculature and decreased pelvic floor spasm.    Baseline  Pt. demonstrates breathing dysfunction and poor PFM coordination evidenced by anal manometry    Time  5    Period  Weeks    Status  New    Target Date  04/20/19      PT SHORT TERM GOAL #2   Title  Patient will report a reduction in pain to no greater than 6/10 over the prior week to demonstrate symptom improvement.    Baseline  Pain is 10/10 at worst, 2/10 at best    Time  5    Period  Weeks     Status  New    Target Date  04/20/19      PT SHORT TERM GOAL #3   Title  Patient will demonstrate HEP x1 in the clinic to demonstrate understanding and proper form to allow for further improvement.    Baseline  Pt. lacks knowledge of therepeutic exercises that can decrease her pain/Sx.    Time  5    Period  Weeks    Status  New    Target Date  04/20/19      PT SHORT TERM GOAL #4   Title  Patient will report consistent use of foot-stool (squatty-potty) for positioning with BM to decrease pain with BM and intra-abdominal pressure.    Baseline  Pt. having constipation due to PFM dysfunction    Time  5  Period  Weeks    Status  New    Target Date  04/20/19      PT SHORT TERM GOAL #5   Baseline  --        PT Long Term Goals - 03/16/19 1429      PT LONG TERM GOAL #1   Title  Pt. will be able to participate in regular ADL's with least restrictive device without pain increasing greater than 2/10    Baseline  Pt. limited in her ability to perform household duties by increased pain and fatigue.    Time  10    Period  Weeks    Status  New    Target Date  05/25/19      PT LONG TERM GOAL #2   Title  Patient will score at or below 65/300  on the PFDI and 35% on the Female NIH-CPSI to demonstrate a clinically meaningful decrease in disability and distress due to pelvic floor dysfunction.    Baseline  PFDI: 110/300, Female NIH-CPSI: 29/43 (67%)    Time  10    Period  Weeks    Status  New    Target Date  05/25/19      PT LONG TERM GOAL #3   Title  Patient will report no pain with intercourse to demonstrate improved functional ability.    Baseline  Pt. Having significant pain with intercourse    Time  10    Period  Weeks    Status  New    Target Date  05/25/19      PT LONG TERM GOAL #4   Title  Pt will report ability to work in her yard for greater than 30 minutes without extreme fatigue    Baseline  limited to 10-15 minutes before pt requires ending activity    Time  10     Period  Weeks    Status  New    Target Date  05/25/19      PT LONG TERM GOAL #5   Title  Pt. will be able to go 2-3 hours between emptying her bladder without increased pain and empty her bladder fully whether by urostomy, cath, or voluntary release.    Baseline  Pt. has a urostomy but continues to have to self-catheterize. Has pain with bladder filling and when using catheter.    Time  10    Period  Weeks    Status  New    Target Date  05/25/19      Additional Long Term Goals   Additional Long Term Goals  Yes      PT LONG TERM GOAL #6   Title  Patient will report having BM's at least every-other day with consistency between [facility] stool scale 3-5 over the prior week to demonstrate decreased constipation.    Baseline  Pt. unable to have regula BM's without medication, manometry shows PFM dysfunction    Time  10    Period  Weeks    Status  New    Target Date  05/25/19            Plan - 06/02/19 7371    Personal Factors and Comorbidities  Comorbidity 3+    Comorbidities  neurologic insult with pending diagnosis, multiple recurrent UTI's, BLE weakness, depression    Examination-Activity Limitations  Caring for Others;Carry;Continence;Squat;Stairs;Toileting;Locomotion Level;Lift;Stand    Examination-Participation Restrictions  Church;Cleaning;Community Activity;Driving;Interpersonal Relationship;Laundry;Yard Work;Meal Prep    Stability/Clinical Decision Making  Unstable/Unpredictable    Rehab Potential  Fair    PT Frequency  2x / week    PT Duration  Other (comment)    PT Treatment/Interventions  ADLs/Self Care Home Management;Aquatic Therapy;Biofeedback;Moist Heat;Electrical Stimulation;Cryotherapy;Traction;Ultrasound;Therapeutic activities;Functional mobility training;Stair training;Gait training;Therapeutic exercise;Balance training;Neuromuscular re-education;Patient/family education;Manual techniques;Dry needling;Scar mobilization;Energy conservation;Taping;Joint  Manipulations;Spinal Manipulations;Visual/perceptual remediation/compensation    PT Next Visit Plan  more scar-release and cupping at suprapubic scar, MFR around B ilium. re-assess ROM/back pain. TPDN to multifidus through T-L  junction with E-stim at 40hz  and at 2 hz. continue to work on thoracolumbar rotation ROM and decreasing spasms, add SLS balance training, begin strengthening for reciprocal inhibition. Internal pelvic exam,    PT Home Exercise Plan  diaphragmatic breathing, seated posture (lumbar support and avoiding adduction, low back stretch, side-stretch, hamstring stretch, butterfly stretch, bow-and-arrow, TENS trial, squatty potty, bowel retraining, toileting tips, colonic massage, thoracic extensions and supine chest stretch with towel roll, hip EXT in prone, child's pose.    Consulted and Agree with Plan of Care  Patient       Patient will benefit from skilled therapeutic intervention in order to improve the following deficits and impairments:  Abnormal gait, Decreased balance, Decreased endurance, Decreased mobility, Difficulty walking, Increased muscle spasms, Impaired sensation, Improper body mechanics, Impaired tone, Decreased activity tolerance, Decreased coordination, Decreased strength, Postural dysfunction, Pain  Visit Diagnosis: Other muscle spasm  Muscle weakness (generalized)  Difficulty in walking, not elsewhere classified     Problem List Patient Active Problem List   Diagnosis Date Noted  . Seizure (HCC) 11/11/2018  . Major depressive disorder, recurrent episode, moderate (HCC) 02/07/2018  . Nephrolithiasis 04/16/2016  . Numbness 07/28/2015  . Bladder retention 06/23/2015  . Abdominal pain 06/04/2015  . Dizziness 05/18/2015  . Neck pain 05/18/2015  . Complicated migraine 04/28/2015  . Other fatigue 04/28/2015  . Abnormal finding on MRI of brain 04/28/2015  . D (diarrhea) 03/29/2015  . H/O disease 03/29/2015  . Abnormal weight loss 03/29/2015  . Muscle  weakness (generalized) 03/14/2015  . Headache, migraine 03/10/2015   03/12/2015 DPT, ATC Cleophus Molt 06/02/2019, 7:30 AM  [facility] MAIN [facility] SERVICES [address] [place], [place], [place] Phone: [phone]   Fax:  [phone]  Name: Christine Chavez MRN: [name] Date of Birth: [birth year]

## 2019-05-31 ENCOUNTER — Other Ambulatory Visit: Payer: Self-pay | Admitting: Psychiatry

## 2019-05-31 DIAGNOSIS — F332 Major depressive disorder, recurrent severe without psychotic features: Secondary | ICD-10-CM

## 2019-06-02 ENCOUNTER — Ambulatory Visit: Payer: BC Managed Care – PPO

## 2019-06-02 ENCOUNTER — Other Ambulatory Visit: Payer: Self-pay

## 2019-06-02 DIAGNOSIS — M62838 Other muscle spasm: Secondary | ICD-10-CM | POA: Diagnosis not present

## 2019-06-02 DIAGNOSIS — M6281 Muscle weakness (generalized): Secondary | ICD-10-CM

## 2019-06-02 DIAGNOSIS — R262 Difficulty in walking, not elsewhere classified: Secondary | ICD-10-CM

## 2019-06-02 NOTE — Therapy (Signed)
[place] [facility] MAIN [facility] SERVICES [address] [place], [place], [address] Phone: [phone]   Fax:  [phone]  Physical Therapy Treatment  The patient has been informed of current processes in place at Outpatient [facility]hab to protect patients from Covid-19 exposure including social distancing, schedule modifications, and new cleaning procedures. After discussing their particular risk with a therapist based on the patient's personal risk factors, the patient has decided to proceed with in-person therapy.   Patient Details  Name: Christine Chavez MRN: [id number] Date of Birth: June 15, 1980 Referring Provider (PT): [name]   Encounter Date: 06/02/2019  PT End of Session - 06/02/19 1238    Visit Number  18    Number of Visits  20    Date for PT Re-Evaluation  05/21/19    Authorization Type  [employer]    Authorization Time Period  from 03/12/2019 through 05/21/2019    Authorization - Visit Number  18    Authorization - Number of Visits  20    PT Start Time  0940    PT Stop Time  1040    PT Time Calculation (min)  60 min    Activity Tolerance  No increased pain;Patient tolerated treatment well    Behavior During Therapy  [facility] for tasks assessed/performed       Past Medical History:  Diagnosis Date  . Complication of anesthesia    ? seizures after anesthesia   . Headache   . Migraines   . Neurogenic bladder   . Renal disorder   . Vision abnormalities     Past Surgical History:  Procedure Laterality Date  . ANTERIOR CRUCIATE LIGAMENT REPAIR  1997  . APPENDECTOMY    . COLONOSCOPY WITH PROPOFOL N/A 11/11/2018   Procedure: COLONOSCOPY WITH PROPOFOL;  Surgeon: Christena Deem, MD;  Location: [facility] ENDOSCOPY;  Service: Endoscopy;  Laterality: N/A;  . CYSTOSCOPY WITH STENT PLACEMENT Right 04/17/2016   Procedure: CYSTOSCOPY WITH STENT PLACEMENT;  Surgeon: Malen Gauze, MD;  Location: [facility] ORS;  Service: Urology;  Laterality:  Right;  . ESOPHAGOGASTRODUODENOSCOPY (EGD) WITH PROPOFOL N/A 11/11/2018   Procedure: ESOPHAGOGASTRODUODENOSCOPY (EGD) WITH PROPOFOL;  Surgeon: Christena Deem, MD;  Location: [facility] ENDOSCOPY;  Service: Endoscopy;  Laterality: N/A;  . EXPLORATORY LAPAROTOMY  1999  . KIDNEY STONE SURGERY  04/2016  . REVISION UROSTOMY CUTANEOUS    . REVISION UROSTOMY CUTANEOUS  01/10/2018  . SUPRAPUBIC CATHETER PLACEMENT  08/2017  . TONSILLECTOMY      There were no vitals filed for this visit.   Pelvic Floor Physical Therapy Treatment Note  SCREENING  Changes in medications, allergies, or medical history?: Is taking an antibiotic for her bladder infection.  SUBJECTIVE  Patient reports: Back has improved some, bladder spasms have been cranked up still. Is not sure if the antibiotic really got rid of her UTI. She ha been told by her doctor in the past that they think she may have endometriosis and her mother had it pretty bad when they took out her uterus and she was not aware. She also had another family member with bad endo. She had really strange changes in her blood after/during  Her first hospitalization and it I still different than it was before.  Precautions:  Neurological diagnosis pending, has "non-specific" brain lesions   Pain update:  Location of pain: mid-back (pelvic area) Current pain:  2/10 (4/10, increases with motion) Max pain:  5/10 (6/10 when it spasms) Least pain:  2/10 (3) [name]  of pain: sharp to dull ache   Patient Goals: Get rid of the discomfort with catheterizing and ache/bladder pain. Be able to take fewer medications and be able to enjoy spending time with her family again, doing "all the mom things"    OBJECTIVE  Changes in: Range of Motion/Flexibilty:  (from 12/10) Spine: ~ 2-3 fingers from knee with SB B. ~ 50% reduced R rotation with stretch in L, ~ 75% reduced L rotation with increased tightness. Forward bend: 19 in.from floor. Hips: Unable to achieve  neutral pelvis in long-sitting due to hamstring tightness.  Today: ~ 20% reduced ROM with pain in L mid/upper back with end of range B.   Special tests:  (from previous note) Clonus: 2-3 beats on R 3-4 beats on L Tone: no cog-wheel rigidity noted today (possibly due to increased gabapentin)    Strength/MMT:  LE MMT (from 12-10) LE MMT Left Right  Hip flex:  (L2) /5 /5  Hip ext: 3+/5 3+/5  Hip abd: 4/5 4/5  Hip add: 4/5 3+/5  Hip IR 4/5 3+/5  Hip ER 4/5 4/5   Pelvic Floor External Exam: Introitus Appears: elevated Skin integrity: WNL Palpation: TTP to B STP Cough: limited motion Prolapse visible?: no Scar mobility: not assessed  Internal Vaginal Exam: Strength (PERF): Pt. Has difficulty sensing the PFM to generate contraction or relaxation due to high tone/neuro involvement Symmetry: L>R for spasm/TTP Palpation: TTP to all muscles throughout B Prolapse: none   Palpation: Today: decreased fascial mobility and TTP through B iliacus and TA. Decreased mobility to scar. Able to see and feel a direct connection of scar tissue pulling from the L iliac crest to the midline scar from her suprapubic catheter.  (from previous session) Decreased scar mobility along midline suprapubic scar which increases/recreates bladder "twitching" sensation which is a mild version of the intense bladder spasms she often experiences.  TTP to OI R Glute min, Piriformis, adductors and hip-flexors (from 12-10)   Gait Analysis:  INTERVENTIONS THIS SESSION:  Manual:  Performed cupping and TP release/STM/MFR to L ilium and cupping along scar to continue to decrease scar restriction and tension on the bladder and allow for decreased pain and spasm.     Total time: 60 min.                               PT Short Term Goals - 03/16/19 1421      PT SHORT TERM GOAL #1   Title  Patient will demonstrate coordinated diaphragmatic breathing with pelvic tilts to demonstrate  improved control of diaphragm and TA, to allow for further strengthening of core musculature and decreased pelvic floor spasm.    Baseline  Pt. demonstrates breathing dysfunction and poor PFM coordination evidenced by anal manometry    Time  5    Period  Weeks    Status  New    Target Date  04/20/19      PT SHORT TERM GOAL #2   Title  Patient will report a reduction in pain to no greater than 6/10 over the prior week to demonstrate symptom improvement.    Baseline  Pain is 10/10 at worst, 2/10 at best    Time  5    Period  Weeks    Status  New    Target Date  04/20/19      PT SHORT TERM GOAL #3   Title  Patient will demonstrate HEP x1  in the clinic to demonstrate understanding and proper form to allow for further improvement.    Baseline  Pt. lacks knowledge of therepeutic exercises that can decrease her pain/Sx.    Time  5    Period  Weeks    Status  New    Target Date  04/20/19      PT SHORT TERM GOAL #4   Title  Patient will report consistent use of foot-stool (squatty-potty) for positioning with BM to decrease pain with BM and intra-abdominal pressure.    Baseline  Pt. having constipation due to PFM dysfunction    Time  5    Period  Weeks    Status  New    Target Date  04/20/19      PT SHORT TERM GOAL #5   Baseline  --        PT Long Term Goals - 03/16/19 1429      PT LONG TERM GOAL #1   Title  Pt. will be able to participate in regular ADL's with least restrictive device without pain increasing greater than 2/10    Baseline  Pt. limited in her ability to perform household duties by increased pain and fatigue.    Time  10    Period  Weeks    Status  New    Target Date  05/25/19      PT LONG TERM GOAL #2   Title  Patient will score at or below 65/300  on the PFDI and 35% on the Female NIH-CPSI to demonstrate a clinically meaningful decrease in disability and distress due to pelvic floor dysfunction.    Baseline  PFDI: 110/300, Female NIH-CPSI: 29/43 (67%)    Time   10    Period  Weeks    Status  New    Target Date  05/25/19      PT LONG TERM GOAL #3   Title  Patient will report no pain with intercourse to demonstrate improved functional ability.    Baseline  Pt. Having significant pain with intercourse    Time  10    Period  Weeks    Status  New    Target Date  05/25/19      PT LONG TERM GOAL #4   Title  Pt will report ability to work in her yard for greater than 30 minutes without extreme fatigue    Baseline  limited to 10-15 minutes before pt requires ending activity    Time  10    Period  Weeks    Status  New    Target Date  05/25/19      PT LONG TERM GOAL #5   Title  Pt. will be able to go 2-3 hours between emptying her bladder without increased pain and empty her bladder fully whether by urostomy, cath, or voluntary release.    Baseline  Pt. has a urostomy but continues to have to self-catheterize. Has pain with bladder filling and when using catheter.    Time  10    Period  Weeks    Status  New    Target Date  05/25/19      Additional Long Term Goals   Additional Long Term Goals  Yes      PT LONG TERM GOAL #6   Title  Patient will report having BM's at least every-other day with consistency between [facility] stool scale 3-5 over the prior week to demonstrate decreased constipation.    Baseline  Pt. unable to  have regula BM's without medication, manometry shows PFM dysfunction    Time  10    Period  Weeks    Status  New    Target Date  05/25/19            Plan - 06/02/19 1239    Clinical Impression Statement  Pt. Responded well to all interventions today, demonstrating improved scar mobility and decreased tenderness carry-over on R iliac crest, decreased spasm and improved scar length from today's treatment on the L and at the suprapubic scar. She continues to have adhesions in the lower pelvic are and will continue to benefit from skilled physical therapy to work toward remaining goals and maximize function as well as  decrease likelihood of symptom increase or recurrence.     Personal Factors and Comorbidities  Comorbidity 3+    Comorbidities  neurologic insult with pending diagnosis, multiple recurrent UTI's, BLE weakness, depression    Examination-Activity Limitations  Caring for Others;Carry;Continence;Squat;Stairs;Toileting;Locomotion Level;Lift;Stand    Examination-Participation Restrictions  Church;Cleaning;Community Activity;Driving;Interpersonal Relationship;Laundry;Yard Work;Meal Prep    Stability/Clinical Decision Making  Unstable/Unpredictable    Clinical Decision Making  High    Rehab Potential  Fair    PT Frequency  2x / week    PT Duration  Other (comment)    PT Treatment/Interventions  ADLs/Self Care Home Management;Aquatic Therapy;Biofeedback;Moist Heat;Electrical Stimulation;Cryotherapy;Traction;Ultrasound;Therapeutic activities;Functional mobility training;Stair training;Gait training;Therapeutic exercise;Balance training;Neuromuscular re-education;Patient/family education;Manual techniques;Dry needling;Scar mobilization;Energy conservation;Taping;Joint Manipulations;Spinal Manipulations;Visual/perceptual remediation/compensation    PT Next Visit Plan  more scar-release and cupping at suprapubic scar, MFR around B ilium. re-assess ROM/back pain. TPDN to multifidus through T-L  junction with E-stim at 40hz  and at 2 hz. continue to work on thoracolumbar rotation ROM and decreasing spasms, add SLS balance training, begin strengthening for reciprocal inhibition. Internal pelvic exam,    PT Home Exercise Plan  diaphragmatic breathing, seated posture (lumbar support and avoiding adduction, low back stretch, side-stretch, hamstring stretch, butterfly stretch, bow-and-arrow, TENS trial, squatty potty, bowel retraining, toileting tips, colonic massage, thoracic extensions and supine chest stretch with towel roll, hip EXT in prone, child's pose.    Consulted and Agree with Plan of Care  Patient        Patient will benefit from skilled therapeutic intervention in order to improve the following deficits and impairments:  Abnormal gait, Decreased balance, Decreased endurance, Decreased mobility, Difficulty walking, Increased muscle spasms, Impaired sensation, Improper body mechanics, Impaired tone, Decreased activity tolerance, Decreased coordination, Decreased strength, Postural dysfunction, Pain  Visit Diagnosis: Other muscle spasm  Muscle weakness (generalized)  Difficulty in walking, not elsewhere classified     Problem List Patient Active Problem List   Diagnosis Date Noted  . Seizure (HCC) 11/11/2018  . Major depressive disorder, recurrent episode, moderate (HCC) 02/07/2018  . Nephrolithiasis 04/16/2016  . Numbness 07/28/2015  . Bladder retention 06/23/2015  . Abdominal pain 06/04/2015  . Dizziness 05/18/2015  . Neck pain 05/18/2015  . Complicated migraine 04/28/2015  . Other fatigue 04/28/2015  . Abnormal finding on MRI of brain 04/28/2015  . D (diarrhea) 03/29/2015  . H/O disease 03/29/2015  . Abnormal weight loss 03/29/2015  . Muscle weakness (generalized) 03/14/2015  . Headache, migraine 03/10/2015   03/12/2015 DPT, ATC Cleophus Molt 06/02/2019, 12:54 PM  [place] [facility] MAIN [facility] SERVICES [address] [place], [place], [place] Phone: [phone]   Fax:  [phone]  Name: Briteny Fulghum MRN: [name] Date of Birth: [birth year]

## 2019-06-04 ENCOUNTER — Ambulatory Visit: Payer: BC Managed Care – PPO

## 2019-06-04 ENCOUNTER — Other Ambulatory Visit: Payer: Self-pay

## 2019-06-04 DIAGNOSIS — M62838 Other muscle spasm: Secondary | ICD-10-CM

## 2019-06-04 DIAGNOSIS — M6281 Muscle weakness (generalized): Secondary | ICD-10-CM

## 2019-06-04 DIAGNOSIS — R262 Difficulty in walking, not elsewhere classified: Secondary | ICD-10-CM

## 2019-06-04 NOTE — Therapy (Signed)
[place] [facility] MAIN [facility] SERVICES [address] [place], [place], [address] Phone: [phone]   Fax:  [phone]  Physical Therapy Treatment  The patient has been informed of current processes in place at Outpatient Rehab to protect patients from Covid-19 exposure including social distancing, schedule modifications, and new cleaning procedures. After discussing their particular risk with a therapist based on the patient's personal risk factors, the patient has decided to proceed with in-person therapy.   Patient Details  Name: Christine Chavez MRN: [id number] Date of Birth: February 11, 1981 Referring Provider (PT): [name]   Encounter Date: 06/04/2019  PT End of Session - 06/04/19 1245    Visit Number  19    Number of Visits  20    Date for PT Re-Evaluation  05/21/19    Authorization Type  [employer]    Authorization Time Period  from 03/12/2019 through 05/21/2019    Authorization - Visit Number  19    Authorization - Number of Visits  20    PT Start Time  1000    PT Stop Time  1100    PT Time Calculation (min)  60 min    Activity Tolerance  No increased pain;Patient tolerated treatment well    Behavior During Therapy  [facility] for tasks assessed/performed       Past Medical History:  Diagnosis Date  . Complication of anesthesia    ? seizures after anesthesia   . Headache   . Migraines   . Neurogenic bladder   . Renal disorder   . Vision abnormalities     Past Surgical History:  Procedure Laterality Date  . ANTERIOR CRUCIATE LIGAMENT REPAIR  1997  . APPENDECTOMY    . COLONOSCOPY WITH PROPOFOL N/A 11/11/2018   Procedure: COLONOSCOPY WITH PROPOFOL;  Surgeon: Christena Deem, MD;  Location: [facility] ENDOSCOPY;  Service: Endoscopy;  Laterality: N/A;  . CYSTOSCOPY WITH STENT PLACEMENT Right 04/17/2016   Procedure: CYSTOSCOPY WITH STENT PLACEMENT;  Surgeon: Malen Gauze, MD;  Location: [facility] ORS;  Service: Urology;  Laterality:  Right;  . ESOPHAGOGASTRODUODENOSCOPY (EGD) WITH PROPOFOL N/A 11/11/2018   Procedure: ESOPHAGOGASTRODUODENOSCOPY (EGD) WITH PROPOFOL;  Surgeon: Christena Deem, MD;  Location: [facility] ENDOSCOPY;  Service: Endoscopy;  Laterality: N/A;  . EXPLORATORY LAPAROTOMY  1999  . KIDNEY STONE SURGERY  04/2016  . REVISION UROSTOMY CUTANEOUS    . REVISION UROSTOMY CUTANEOUS  01/10/2018  . SUPRAPUBIC CATHETER PLACEMENT  08/2017  . TONSILLECTOMY      There were no vitals filed for this visit.   Pelvic Floor Physical Therapy Treatment Note  SCREENING  Changes in medications, allergies, or medical history?: Is taking an antibiotic for her bladder infection.  SUBJECTIVE  Patient reports: She has not had a BM since Sunday, she is very bloated and uncomfortable. Used the TENS unit several times yesterday but has not today, will continue more regularly until next visit. Started her period today. Will probably use an Enema later today. She is nervous because the person who brought her from church today is someone she does not really know. Her GI issues are increasing her discomfort in her back.  Precautions:  Neurological diagnosis pending, has "non-specific" brain lesions   Pain update:  Location of pain: mid-back (pelvic area) Current pain:  3/10 (4/10, increases with motion) Max pain:  6/10 (6/10 when it spasms) Least pain:  2/10 (2) Nature of pain: sharp to dull ache  Feels "looser" with rotation following treatment but still has pain.  Patient Goals: Get rid of the discomfort with catheterizing and ache/bladder pain. Be able to take fewer medications and be able to enjoy spending time with her family again, doing "all the mom things"    OBJECTIVE  Changes in: Range of Motion/Flexibilty:  (from 12/10) Spine: ~ 2-3 fingers from knee with SB B. ~ 50% reduced R rotation with stretch in L, ~ 75% reduced L rotation with increased tightness. Forward bend: 19 in.from floor. Hips: Unable to achieve  neutral pelvis in long-sitting due to hamstring tightness.  Today: ~ 20% reduced ROM with pain in L mid/upper back with end of range B. Decreased  Mobility through T11-L1 and around sacral borders (with radiating pain into the abdomen/diaphragm region and to rectum)   Special tests:  (from previous note) Clonus: 2-3 beats on R 3-4 beats on L Tone: no cog-wheel rigidity noted today (possibly due to increased gabapentin)    Strength/MMT:  LE MMT (from 12-10) LE MMT Left Right  Hip flex:  (L2) /5 /5  Hip ext: 3+/5 3+/5  Hip abd: 4/5 4/5  Hip add: 4/5 3+/5  Hip IR 4/5 3+/5  Hip ER 4/5 4/5   Pelvic Floor External Exam: Introitus Appears: elevated Skin integrity: WNL Palpation: TTP to B STP Cough: limited motion Prolapse visible?: no Scar mobility: not assessed  Internal Vaginal Exam: Strength (PERF): Pt. Has difficulty sensing the PFM to generate contraction or relaxation due to high tone/neuro involvement Symmetry: L>R for spasm/TTP Palpation: TTP to all muscles throughout B Prolapse: none   Palpation:  (from previous session) Decreased scar mobility along midline suprapubic scar which increases/recreates bladder "twitching" sensation which is a mild version of the intense bladder spasms she often experiences.  TTP to OI R Glute min, Piriformis, adductors and hip-flexors (from 12-10)   Gait Analysis:  INTERVENTIONS THIS SESSION:  Manual:  Performed grade 3-4 mobs to all sacral borders and to T-L junction to improve mobility of joint and surrounding connective tissue and decrease pressure on nerve roots for improved conductivity and function of down-stream tissues.   Self-care: Educated briefly on how to begin working on self TP release/ where to focus to decrease constipation.      Total time: 60 min.                               PT Short Term Goals - 03/16/19 1421      PT SHORT TERM GOAL #1   Title  Patient will demonstrate  coordinated diaphragmatic breathing with pelvic tilts to demonstrate improved control of diaphragm and TA, to allow for further strengthening of core musculature and decreased pelvic floor spasm.    Baseline  Pt. demonstrates breathing dysfunction and poor PFM coordination evidenced by anal manometry    Time  5    Period  Weeks    Status  New    Target Date  04/20/19      PT SHORT TERM GOAL #2   Title  Patient will report a reduction in pain to no greater than 6/10 over the prior week to demonstrate symptom improvement.    Baseline  Pain is 10/10 at worst, 2/10 at best    Time  5    Period  Weeks    Status  New    Target Date  04/20/19      PT SHORT TERM GOAL #3   Title  Patient will demonstrate HEP x1 in the clinic  to demonstrate understanding and proper form to allow for further improvement.    Baseline  Pt. lacks knowledge of therepeutic exercises that can decrease her pain/Sx.    Time  5    Period  Weeks    Status  New    Target Date  04/20/19      PT SHORT TERM GOAL #4   Title  Patient will report consistent use of foot-stool (squatty-potty) for positioning with BM to decrease pain with BM and intra-abdominal pressure.    Baseline  Pt. having constipation due to PFM dysfunction    Time  5    Period  Weeks    Status  New    Target Date  04/20/19      PT SHORT TERM GOAL #5   Baseline  --        PT Long Term Goals - 03/16/19 1429      PT LONG TERM GOAL #1   Title  Pt. will be able to participate in regular ADL's with least restrictive device without pain increasing greater than 2/10    Baseline  Pt. limited in her ability to perform household duties by increased pain and fatigue.    Time  10    Period  Weeks    Status  New    Target Date  05/25/19      PT LONG TERM GOAL #2   Title  Patient will score at or below 65/300  on the PFDI and 35% on the Female NIH-CPSI to demonstrate a clinically meaningful decrease in disability and distress due to pelvic floor  dysfunction.    Baseline  PFDI: 110/300, Female NIH-CPSI: 29/43 (67%)    Time  10    Period  Weeks    Status  New    Target Date  05/25/19      PT LONG TERM GOAL #3   Title  Patient will report no pain with intercourse to demonstrate improved functional ability.    Baseline  Pt. Having significant pain with intercourse    Time  10    Period  Weeks    Status  New    Target Date  05/25/19      PT LONG TERM GOAL #4   Title  Pt will report ability to work in her yard for greater than 30 minutes without extreme fatigue    Baseline  limited to 10-15 minutes before pt requires ending activity    Time  10    Period  Weeks    Status  New    Target Date  05/25/19      PT LONG TERM GOAL #5   Title  Pt. will be able to go 2-3 hours between emptying her bladder without increased pain and empty her bladder fully whether by urostomy, cath, or voluntary release.    Baseline  Pt. has a urostomy but continues to have to self-catheterize. Has pain with bladder filling and when using catheter.    Time  10    Period  Weeks    Status  New    Target Date  05/25/19      Additional Long Term Goals   Additional Long Term Goals  Yes      PT LONG TERM GOAL #6   Title  Patient will report having BM's at least every-other day with consistency between [facility] stool scale 3-5 over the prior week to demonstrate decreased constipation.    Baseline  Pt. unable to have regula BM's  without medication, manometry shows PFM dysfunction    Time  10    Period  Weeks    Status  New    Target Date  05/25/19            Plan - 06/04/19 1245    Clinical Impression Statement  Pt. Responded well to all interventions today, demonstrating improved mobility around the sacrum and T-L junction as well as understanding and correct performance of all education and exercises provided today. They will continue to benefit from skilled physical therapy to work toward remaining goals and maximize function as well as decrease  likelihood of symptom increase or recurrence.     PT Next Visit Plan  walk Pt. through self internal release PRN, rotational mobilizations through T-L junction, more scar-release and cupping at suprapubic scar, MFR around B ilium. re-assess ROM/back pain. TPDN to multifidus through T-L  junction with E-stim at 40hz  and at 2 hz. continue to work on thoracolumbar rotation ROM and decreasing spasms, add SLS balance training, begin strengthening for reciprocal inhibition. Internal pelvic exam,    PT Home Exercise Plan  diaphragmatic breathing, seated posture (lumbar support and avoiding adduction, low back stretch, side-stretch, hamstring stretch, butterfly stretch, bow-and-arrow, TENS trial, squatty potty, bowel retraining, toileting tips, colonic massage, thoracic extensions and supine chest stretch with towel roll, hip EXT in prone, child's pose., self internal TP release.    Consulted and Agree with Plan of Care  Patient       Patient will benefit from skilled therapeutic intervention in order to improve the following deficits and impairments:     Visit Diagnosis: Other muscle spasm  Muscle weakness (generalized)  Difficulty in walking, not elsewhere classified     Problem List Patient Active Problem List   Diagnosis Date Noted  . Seizure (HCC) 11/11/2018  . Major depressive disorder, recurrent episode, moderate (HCC) 02/07/2018  . Nephrolithiasis 04/16/2016  . Numbness 07/28/2015  . Bladder retention 06/23/2015  . Abdominal pain 06/04/2015  . Dizziness 05/18/2015  . Neck pain 05/18/2015  . Complicated migraine 04/28/2015  . Other fatigue 04/28/2015  . Abnormal finding on MRI of brain 04/28/2015  . D (diarrhea) 03/29/2015  . H/O disease 03/29/2015  . Abnormal weight loss 03/29/2015  . Muscle weakness (generalized) 03/14/2015  . Headache, migraine 03/10/2015   03/12/2015 DPT, ATC Cleophus Molt 06/04/2019, 12:58 PM  [place] [facility] MAIN  [facility] SERVICES [address] [place], [place], [place] Phone: [phone]   Fax:  [phone]  Name: Christine Chavez MRN: [name] Date of Birth: 07/26/1980

## 2019-06-04 NOTE — Patient Instructions (Signed)
Self Internal Trigger Point Relief    1) Wash your hands and prop yourself up in a way where you can easily reach the vagina. You may wish to have a small hand-held mirror near by.  2) lubricate the tool and vaginal opening using a hypoallergenic lubricant such as "slippery-stuff".   3) Slowly and gently insert the tool into the vagina using deep breaths to allow relaxation of the muscles around the tool.  4) Avoiding the "12 o-clock" region near the urethra, gently use the handle of the tool like a lever to press the angled tip of the tool onto the wall of the pelvic floor.   5) Move the tool to different areas of the pelvic floor and feel for areas that are tender called "trigger points". When you find one hold the tool still, applying just enough pressure to elicit mild discomfort and take deep belly breaths until the discomfort subsides or decreases by at least 50%.   6) Repeat the process for any trigger points you find spending between 3-10 minutes on this per night until you do not find any more trigger points or you are told otherwise by your therapist..   

## 2019-06-06 ENCOUNTER — Ambulatory Visit (INDEPENDENT_AMBULATORY_CARE_PROVIDER_SITE_OTHER): Payer: BC Managed Care – PPO | Admitting: Psychiatry

## 2019-06-06 ENCOUNTER — Encounter: Payer: Self-pay | Admitting: Psychiatry

## 2019-06-06 VITALS — Wt 140.0 lb

## 2019-06-06 DIAGNOSIS — F419 Anxiety disorder, unspecified: Secondary | ICD-10-CM

## 2019-06-06 DIAGNOSIS — F5101 Primary insomnia: Secondary | ICD-10-CM

## 2019-06-06 DIAGNOSIS — F331 Major depressive disorder, recurrent, moderate: Secondary | ICD-10-CM | POA: Diagnosis not present

## 2019-06-06 MED ORDER — VORTIOXETINE HBR 20 MG PO TABS: 20.0000 mg | ORAL_TABLET | Freq: Every day | ORAL | 0 refills | Status: DC

## 2019-06-06 NOTE — Progress Notes (Signed)
Christine Chavez [id number] 25-Jun-1980 39 y.o.  Virtual Visit via Telephone Note  I connected with pt on 06/06/19 at  9:30 AM EST by telephone and verified that I am speaking with the correct person using two identifiers.   I discussed the limitations, risks, security and privacy concerns of performing an evaluation and management service by telephone and the availability of in person appointments. I also discussed with the patient that there may be a patient responsible charge related to this service. The patient expressed understanding and agreed to proceed.   I discussed the assessment and treatment plan with the patient. The patient was provided an opportunity to ask questions and all were answered. The patient agreed with the plan and demonstrated an understanding of the instructions.   The patient was advised to call back or seek an in-person evaluation if the symptoms worsen or if the condition fails to improve as anticipated.  I provided 30 minutes of non-face-to-face time during this encounter.  The patient was located at home.  The provider was located at [facility].   Christine Chavez, PMHNP   Subjective:   Patient ID:  Christine Chavez is a 39 y.o. (DOB [birth year]) female.  Chief Complaint:  Chief Complaint  Patient presents with  . Depression  . Anxiety  . Insomnia    HPI [name] presents for follow-up of depression and anxiety. She reports that it is difficult to assess response to Wellbutrin since she had a UTI and increase in Lyrica around the same time she started Wellbutrin. She reports that she has had severe fatigue. She reports that she has been less tearful. "The motivation to try to do things is there" but is physically limited. She reports anxiety is worse at times than others. Worry and anxiety in response to worsening physical s/s and fearful about hospitalization. Some anxiety around people and this is related to being self-conscious  about her health issues. Continues to experience excessive guilt at times and is working with psychologist about dealing with guilt and improving self-care. Occ frustration in response to circumstances. Denies excessive irritability. She reports that she is falling asleep 30-45 minutes after taking HS meds. Has been having some difficulty staying asleep and this has been exacerbated by physical issues. Has had nights where she has had multiple awakenings. Reports that sleep has not been "as good as it has been in the past." Appetite has been decreased and attributes this to infection and constipation. Concentration has been ok. Denies SI.    Past medication Trials: Remeron- Reports sleeping better at 15 mg but feels more lethargic upon awakening compared to 7.5 mg.  Sertraline- Has been on 200 mg for the last month and has not noticed a significant improvement. Cymbalta- Reports increased drowsiness and impaired concentration at 90 mg. Trintellix Wellbutrin Rexulti-Increased heart rate and SOB Abilify Gabapentin- Causes drowsiness. Anxiety may have increased when she stopped taking TID. Reports that is did not seem to be effective for pain Lyrica Did not start Lithium since PCP advised against it  Review of Systems:  Review of Systems  Gastrointestinal: Positive for constipation.  Genitourinary:       Bladder spasms. Recent UTI  Musculoskeletal: Positive for gait problem.  Neurological: Negative for tremors.  Psychiatric/Behavioral:       Please refer to HPI    Medications: I have reviewed the patient's current medications.  Current Outpatient Medications  Medication Sig Dispense Refill  . B Complex Vitamins (VITAMIN B COMPLEX 100)  INJ Inject as directed.    . belladonna-opium (B&O SUPPRETTES) 16.2-30 MG suppository Place 30 mg rectally every 8 (eight) hours as needed for pain.    Marland Kitchen buPROPion (WELLBUTRIN SR) 100 MG 12 hr tablet TAKE 1 TABLET BY MOUTH EVERY DAY IN THE MORNING 90  tablet 0  . butalbital-acetaminophen-caffeine (FIORICET, ESGIC) 50-325-40 MG tablet Take by mouth.    . Cyanocobalamin (VITAMIN B-12) 2500 MCG SUBL Take 2,500 mcg by mouth daily.    . ergocalciferol (VITAMIN D2) 1.25 MG (50000 UT) capsule Take by mouth.    . folic acid (FOLVITE) 1 MG tablet Take 1 mg by mouth daily.    Marland Kitchen LINZESS 72 MCG capsule TAKE 1 CAPSULE (72 MCG TOTAL) BY MOUTH ONCE DAILY    . mirabegron ER (MYRBETRIQ) 50 MG TB24 tablet Take 50 mg by mouth daily.     . mirtazapine (REMERON) 15 MG tablet Take 1 tablet (15 mg total) by mouth at bedtime. 90 tablet 1  . ondansetron (ZOFRAN) 4 MG tablet Take 4 mg by mouth every 8 (eight) hours as needed for nausea or vomiting.    Marland Kitchen oxybutynin (DITROPAN) 5 MG tablet Take 5 mg by mouth 3 (three) times daily.    Marland Kitchen oxybutynin (DITROPAN-XL) 5 MG 24 hr tablet Take 5 mg by mouth at bedtime.    . OXYCODONE HCL PO Take 5 mg by mouth every 6 (six) hours as needed.    . pantoprazole (PROTONIX) 40 MG tablet Take 40 mg by mouth 2 (two) times daily.     . pregabalin (LYRICA) 75 MG capsule Takes 2 caps po q am and 2 caps po QHS    . promethazine (PHENERGAN) 12.5 MG tablet Take 1 tablet (12.5 mg total) by mouth every 4 (four) hours as needed for nausea or vomiting. 20 tablet 0  . rizatriptan (MAXALT) 5 MG tablet Take 5 mg by mouth as needed.     . Trospium Chloride 60 MG CP24 Take by mouth.    . vortioxetine HBr (TRINTELLIX) 20 MG TABS tablet Take 1 tablet (20 mg total) by mouth daily. 90 tablet 0  . gabapentin (NEURONTIN) 300 MG capsule Take 1 capsule (300 mg total) by mouth 2 (two) times daily. (Patient not taking: Reported on 03/21/2019)    . LORazepam (ATIVAN) 0.5 MG tablet Take 1/2-1 tab po qd prn severe anxiety (Patient not taking: Reported on 03/21/2019) 15 tablet 1   No current facility-administered medications for this visit.    Medication Side Effects: None  Initially had some dizziness with Wellbutrin  Allergies:  Allergies  Allergen Reactions   . Aspirin Shortness Of Breath, Swelling and Anaphylaxis  . Ibuprofen Shortness Of Breath, Swelling and Anaphylaxis  . Morphine And Related Hives    Past Medical History:  Diagnosis Date  . Complication of anesthesia    ? seizures after anesthesia   . Headache   . Migraines   . Neurogenic bladder   . Renal disorder   . Vision abnormalities     Family History  Problem Relation Age of Onset  . Hypertension Mother   . Atrial fibrillation Father   . Healthy Brother   . Depression Brother   . Arthritis/Rheumatoid Paternal Grandmother   . Healthy Brother     Social History   Socioeconomic History  . Marital status: Married    Spouse name: Not on file  . Number of children: Not on file  . Years of education: Not on file  . Highest education level:  Not on file  Occupational History  . Not on file  Tobacco Use  . Smoking status: Never Smoker  . Smokeless tobacco: Never Used  Substance and Sexual Activity  . Alcohol use: No    Alcohol/week: 0.0 standard drinks  . Drug use: No  . Sexual activity: Yes    Birth control/protection: None    Comment: vasectomy  Other Topics Concern  . Not on file  Social History Narrative  . Not on file   Social Determinants of Health   Financial Resource Strain:   . Difficulty of Paying Living Expenses: Not on file  Food Insecurity:   . Worried About Programme researcher, broadcasting/film/video in the Last Year: Not on file  . Ran Out of Food in the Last Year: Not on file  Transportation Needs:   . Lack of Transportation (Medical): Not on file  . Lack of Transportation (Non-Medical): Not on file  Physical Activity:   . Days of Exercise per Week: Not on file  . Minutes of Exercise per Session: Not on file  Stress:   . Feeling of Stress : Not on file  Social Connections:   . Frequency of Communication with Friends and Family: Not on file  . Frequency of Social Gatherings with Friends and Family: Not on file  . Attends Religious Services: Not on file  .  Active Member of Clubs or Organizations: Not on file  . Attends Banker Meetings: Not on file  . Marital Status: Not on file  Intimate Partner Violence:   . Fear of Current or Ex-Partner: Not on file  . Emotionally Abused: Not on file  . Physically Abused: Not on file  . Sexually Abused: Not on file    Past Medical History, Surgical history, Social history, and Family history were reviewed and updated as appropriate.   Please see review of systems for further details on the patient's review from today.   Objective:   Physical Exam:  Wt 140 lb (63.5 kg)   BMI 21.93 kg/m   Physical Exam Neurological:     Mental Status: She is alert and oriented to person, place, and time.     Cranial Nerves: No dysarthria.  Psychiatric:        Attention and Perception: Attention and perception normal.        Mood and Affect: Mood is anxious and depressed.        Speech: Speech normal.        Behavior: Behavior is cooperative.        Thought Content: Thought content normal. Thought content is not paranoid or delusional. Thought content does not include homicidal or suicidal ideation. Thought content does not include homicidal or suicidal plan.        Cognition and Memory: Cognition and memory normal.        Judgment: Judgment normal.     Comments: Insight intact Presents as less anxious and depressed compared to recent exams     Lab Review:     Component Value Date/Time   NA 139 11/12/2018 0520   K 3.4 (L) 11/12/2018 0520   CL 110 11/12/2018 0520   CO2 24 11/12/2018 0520   GLUCOSE 91 11/12/2018 0520   BUN <5 (L) 11/12/2018 0520   CREATININE 0.65 11/12/2018 0520   CALCIUM 8.0 (L) 11/12/2018 0520   PROT 7.2 03/03/2017 2132   ALBUMIN 4.3 03/03/2017 2132   AST 17 03/03/2017 2132   ALT 15 03/03/2017 2132   ALKPHOS 51  03/03/2017 2132   BILITOT 1.7 (H) 03/03/2017 2132   GFRNONAA >60 11/12/2018 0520   GFRAA >60 11/12/2018 0520       Component Value Date/Time   WBC 7.2  11/12/2018 0520   RBC 3.59 (L) 11/12/2018 0520   HGB 11.2 (L) 11/12/2018 0520   HCT 33.8 (L) 11/12/2018 0520   HCT 31.7 (L) 04/28/2012 0618   PLT 252 11/12/2018 0520   MCV 94.2 11/12/2018 0520   MCH 31.2 11/12/2018 0520   MCHC 33.1 11/12/2018 0520   RDW 13.4 11/12/2018 0520   LYMPHSABS 3.3 03/03/2017 2132   MONOABS 0.6 03/03/2017 2132   EOSABS 0.1 03/03/2017 2132   BASOSABS 0.1 03/03/2017 2132    No results found for: POCLITH, LITHIUM   No results found for: PHENYTOIN, PHENOBARB, VALPROATE, CBMZ   .res Assessment: Plan:   Pt seen for 30 minutes and time spent counseling pt re: recent mood and anxiety s/s. Discussed option of increasing Wellbutrin to Wellbutrin XL 150 mg po q am, since she appears to be tolerating Wellbutrin XL without difficulty and is experiencing some improvement in depressive s/s, or continuing Wellbutrin SR 100 mg po qd. Pt reports that she would prefer to continue Wellbutrin SR 100 mg po qd since there have been several other variables recently that make it challenging to be able to assess response to Wellbutrin and that mood and energy may further improve with resolution of infection.  Will continue Wellbutrin SR 100 mg po qd for depression.  Continue Remeron 15 mg po QHS for depression and insomnia.  Continue Trintellix 20 mg po qd for mood and anxiety.  Patient advised to contact office with any questions, adverse effects, or acute worsening in signs and symptoms. Pt to f/u in 4 weeks or sooner if clinically indicated.    Christine Chavez was seen today for depression, anxiety and insomnia.  Diagnoses and all orders for this visit:  Moderate episode of recurrent major depressive disorder (HCC) -     vortioxetine HBr (TRINTELLIX) 20 MG TABS tablet; Take 1 tablet (20 mg total) by mouth daily.  Anxiety disorder, unspecified type -     vortioxetine HBr (TRINTELLIX) 20 MG TABS tablet; Take 1 tablet (20 mg total) by mouth daily.  Primary insomnia    Please see  After Visit Summary for patient specific instructions.  Future Appointments  Date Time Provider [place]  06/09/2019  9:30 AM Letitia Libra T, PT [facility] None  06/11/2019 10:00 AM Letitia Libra T, PT [facility] None  06/18/2019 10:00 AM Letitia Libra T, PT [facility] None  06/19/2019  9:00 AM Letitia Libra T, PT [facility] None  06/23/2019  9:30 AM Letitia Libra T, PT [facility] None  06/25/2019 10:00 AM Letitia Libra T, PT [facility] None  06/30/2019  9:30 AM Letitia Libra T, PT [facility] None  07/02/2019 10:00 AM Letitia Libra T, PT [facility] None  07/04/2019  9:00 AM Thayer Headings, PMHNP [facility] None  07/07/2019  9:30 AM Letitia Libra T, PT [facility] None  07/09/2019 10:00 AM Letitia Libra T, PT [facility] None  07/14/2019  9:30 AM Letitia Libra T, PT [facility] None  07/16/2019 10:00 AM Letitia Libra T, PT [facility] None    No orders of the defined types were placed in this encounter.     -------------------------------

## 2019-06-09 ENCOUNTER — Other Ambulatory Visit: Payer: Self-pay

## 2019-06-09 ENCOUNTER — Ambulatory Visit: Payer: BC Managed Care – PPO

## 2019-06-09 DIAGNOSIS — M6281 Muscle weakness (generalized): Secondary | ICD-10-CM

## 2019-06-09 DIAGNOSIS — M62838 Other muscle spasm: Secondary | ICD-10-CM

## 2019-06-09 DIAGNOSIS — R262 Difficulty in walking, not elsewhere classified: Secondary | ICD-10-CM

## 2019-06-09 NOTE — Therapy (Signed)
[facility] MAIN [facility] SERVICES [address] [place], [place], [address] Phone: [phone]   Fax:  [phone]  Physical Therapy Treatment  The patient has been informed of current processes in place at Outpatient Rehab to protect patients from Covid-19 exposure including social distancing, schedule modifications, and new cleaning procedures. After discussing their particular risk with a therapist based on the patient's personal risk factors, the patient has decided to proceed with in-person therapy.   Patient Details  Name: Christine Chavez MRN: [id number] Date of Birth: [birth year] Referring Provider (PT): [name]   Encounter Date: 06/09/2019  PT End of Session - 06/10/19 1133    Visit Number  20    Number of Visits  20    Date for PT Re-Evaluation  05/21/19    Authorization Type  [employer]    Authorization Time Period  from 03/12/2019 through 05/21/2019    Authorization - Visit Number  20    Authorization - Number of Visits  20    PT Start Time  0940    PT Stop Time  1035    PT Time Calculation (min)  55 min    Activity Tolerance  No increased pain;Patient tolerated treatment well    Behavior During Therapy  [facility] for tasks assessed/performed       Past Medical History:  Diagnosis Date  . Complication of anesthesia    ? seizures after anesthesia   . Headache   . Migraines   . Neurogenic bladder   . Renal disorder   . Vision abnormalities     Past Surgical History:  Procedure Laterality Date  . ANTERIOR CRUCIATE LIGAMENT REPAIR  1997  . APPENDECTOMY    . COLONOSCOPY WITH PROPOFOL N/A 11/11/2018   Procedure: COLONOSCOPY WITH PROPOFOL;  Surgeon: Christena Deem, MD;  Location: [facility] ENDOSCOPY;  Service: Endoscopy;  Laterality: N/A;  . CYSTOSCOPY WITH STENT PLACEMENT Right 04/17/2016   Procedure: CYSTOSCOPY WITH STENT PLACEMENT;  Surgeon: Malen Gauze, MD;  Location: [facility] ORS;  Service: Urology;  Laterality:  Right;  . ESOPHAGOGASTRODUODENOSCOPY (EGD) WITH PROPOFOL N/A 11/11/2018   Procedure: ESOPHAGOGASTRODUODENOSCOPY (EGD) WITH PROPOFOL;  Surgeon: Christena Deem, MD;  Location: [facility] ENDOSCOPY;  Service: Endoscopy;  Laterality: N/A;  . EXPLORATORY LAPAROTOMY  1999  . KIDNEY STONE SURGERY  04/2016  . REVISION UROSTOMY CUTANEOUS    . REVISION UROSTOMY CUTANEOUS  01/10/2018  . SUPRAPUBIC CATHETER PLACEMENT  08/2017  . TONSILLECTOMY      There were no vitals filed for this visit.   Pelvic Floor Physical Therapy Treatment Note  SCREENING  Changes in medications, allergies, or medical history?: Is taking an antibiotic for her bladder infection.  SUBJECTIVE  Patient reports: She is still having GI issues. Tried sending a note to her GI doc. And is waiting on a reply. Has had to use an Enema every time. Is drinking " a whole lot" and being really careful with what she eats. Has been taking linzess. Used the tool last night and can tell that it is really tight internally. Has been using the TENS unit other than yesterday.  Has had some pain that catches under the right side of the ribcage that comes and goes. Back pain has been intermittent.   Is worried still about whether the infection is gone but is having a hard time differentiating with the GI Pain as well.  Precautions:  Neurological diagnosis pending, has "non-specific" brain lesions   Pain update:  Location  of pain: R rib-cage/side (pelvic area) Current pain:  6/10 (4/10, increases with motion) Max pain:  8/10 (6/10 when it spasms) Least pain:  2/10 (2) Nature of pain: sharp to dull ache  **Feels "looser" with rotation following treatment but still has pain.  Patient Goals: Get rid of the discomfort with catheterizing and ache/bladder pain. Be able to take fewer medications and be able to enjoy spending time with her family again, doing "all the mom things"    OBJECTIVE  Changes in: Range of Motion/Flexibilty:  (from  12/10) Spine: ~ 2-3 fingers from knee with SB B. ~ 50% reduced R rotation with stretch in L, ~ 75% reduced L rotation with increased tightness. Forward bend: 19 in.from floor. Hips: Unable to achieve neutral pelvis in long-sitting due to hamstring tightness.  Today: ~ 30% reduced ROM in R rotation pre-treatment with pain end-feel.  -~ 15% reduced R ROT ROM following treatment with pain end-feel  Special tests:  (from previous note) Clonus: 2-3 beats on R 3-4 beats on L Tone: no cog-wheel rigidity noted today (possibly due to increased gabapentin)    Strength/MMT:  LE MMT (from 12-10) LE MMT Left Right  Hip flex:  (L2) /5 /5  Hip ext: 3+/5 3+/5  Hip abd: 4/5 4/5  Hip add: 4/5 3+/5  Hip IR 4/5 3+/5  Hip ER 4/5 4/5   Pelvic Floor External Exam: (From prior session)- [Introitus Appears: elevated Skin integrity: WNL Palpation: TTP to B STP Cough: limited motion Prolapse visible?: no Scar mobility: not assessed  Internal Vaginal Exam: Strength (PERF): Pt. Has difficulty sensing the PFM to generate contraction or relaxation due to high tone/neuro involvement Symmetry: L>R for spasm/TTP Palpation: TTP to all muscles throughout B Prolapse: none]   Palpation:  (from previous session) Decreased scar mobility along midline suprapubic scar which increases/recreates bladder "twitching" sensation which is a mild version of the intense bladder spasms she often experiences.  TTP to OI R Glute min, Piriformis, adductors and hip-flexors (from 12-10)  Today: TTP to R>L diaphragm and obliques   Gait Analysis:  INTERVENTIONS THIS SESSION:  Manual:  Performed grade rotational MWM to the R in side-lying "bow-and-arrow" to improve R rotational ROM and take pressure off of T/L junction and reduce diaphragm/GI spasms and dysfunction.  Self-care: Educated on how to use digital spinting, stimulation for BM, and how to stimulate ileocecal junction to help decrease constipation/improve  motility and emptying. Re-assessed progress and updated goals.      Total time: 55 min.                                PT Short Term Goals - 06/10/19 1116      PT SHORT TERM GOAL #1   Title  Patient will demonstrate coordinated diaphragmatic breathing with pelvic tilts to demonstrate improved control of diaphragm and TA, to allow for further strengthening of core musculature and decreased pelvic floor spasm.    Baseline  Pt. demonstrates breathing dysfunction and poor PFM coordination evidenced by anal manometry As of 2/22: Pt. continues to have restriction in her diaphragm and near T/L junction but is able to intentionally use diaphragmatic breathing through the available ROM.    Time  5    Period  Weeks    Status  Achieved    Target Date  04/20/19      PT SHORT TERM GOAL #2   Title  Patient will report a  reduction in pain to no greater than 6/10 over the prior week to demonstrate symptom improvement.    Baseline  Pain is 10/10 at worst, 2/10 at best As of 2/22: Pt. had achieved this goal for 1 week as of 1/21 but may have infection or other insult that caused pain to increase again.    Time  5    Period  Weeks    Status  On-going    Target Date  04/20/19      PT SHORT TERM GOAL #3   Title  Patient will demonstrate HEP x1 in the clinic to demonstrate understanding and proper form to allow for further improvement.    Baseline  Pt. lacks knowledge of therepeutic exercises that can decrease her pain/Sx.    Time  5    Period  Weeks    Status  Achieved    Target Date  04/20/19      PT SHORT TERM GOAL #4   Title  Patient will report consistent use of foot-stool (squatty-potty) for positioning with BM to decrease pain with BM and intra-abdominal pressure.    Baseline  Pt. having constipation due to PFM dysfunction    Time  5    Period  Weeks    Status  Achieved    Target Date  04/20/19        PT Long Term Goals - 06/10/19 0001      PT LONG TERM  GOAL #1   Title  Pt. will be able to participate in regular ADL's with least restrictive device without pain increasing greater than 2/10    Baseline  Pt. limited in her ability to perform household duties by increased pain and fatigue.    Time  10    Period  Weeks    Status  On-going    Target Date  08/19/19      PT LONG TERM GOAL #2   Title  Patient will score at or below 65/300  on the PFDI and 35% on the Female NIH-CPSI to demonstrate a clinically meaningful decrease in disability and distress due to pelvic floor dysfunction.    Baseline  PFDI: 110/300, Female NIH-CPSI: 29/43 (67%)    Time  10    Period  Weeks    Status  Unable to assess    Target Date  08/19/19      PT LONG TERM GOAL #3   Title  Patient will report no pain with intercourse to demonstrate improved functional ability.    Baseline  Pt. Having significant pain with intercourse    Time  10    Period  Weeks    Status  On-going    Target Date  08/19/19      PT LONG TERM GOAL #4   Title  Pt will report ability to work in her yard for greater than 30 minutes without extreme fatigue    Baseline  limited to 10-15 minutes before pt requires ending activity    Time  10    Period  Weeks    Status  On-going    Target Date  08/19/19      PT LONG TERM GOAL #5   Title  Pt. will be able to go 2-3 hours between emptying her bladder without increased pain and empty her bladder fully whether by urostomy, cath, or voluntary release.    Baseline  Pt. has a urostomy but continues to have to self-catheterize. Has pain with bladder filling and when using catheter.  Time  10    Period  Weeks    Status  On-going    Target Date  08/19/19      PT LONG TERM GOAL #6   Title  Patient will report having BM's at least every-other day with consistency between [facility] stool scale 3-5 over the prior week to demonstrate decreased constipation.    Baseline  Pt. unable to have regula BM's without medication, manometry shows PFM dysfunction     Time  10    Period  Weeks    Status  On-going    Target Date  08/19/19            Plan - 06/10/19 1134    Clinical Impression Statement  Pt. continues to have high tone and pain greater than what it was ~ 1 month ago. At this time it seems that she may have a recurrent UTI or that something neurological has changed that has caused an increase in pain. We are continuing to see improvements in her spinal and pelvic alignment and tissue mobility and ROM though slowly but her pain does not appear to be responding in kind. It would be helpful for her to have anouther test for UTI and further evaluation or treatment by neuro to help determine why there has been a sudden increase in pain and spasm that is making the Pt. have increasing difficulty with BM's requiring multiple modalities to allow for passing of stool. Pt. responded well to education on methods of digital stimulation and splinting to improve ability to empty bowels and decreased diaphragm spasm but she has regressed in her pain from where she was ~ 1 month ago. She will continue to benefit from regular physical therapy to help manage spasms and pain and improve PFM length to allow for increased ease of emptying bladder and bowel, building on current progress, but will also benefit from further medical evaluation to determine cause of set-back.    Personal Factors and Comorbidities  Comorbidity 3+    Comorbidities  neurologic insult with pending diagnosis, multiple recurrent UTI's, BLE weakness, depression    Examination-Activity Limitations  Caring for Others;Carry;Continence;Squat;Stairs;Toileting;Locomotion Level;Lift;Stand    Examination-Participation Restrictions  Church;Cleaning;Community Activity;Driving;Interpersonal Relationship;Laundry;Yard Work;Meal Prep    Stability/Clinical Decision Making  Unstable/Unpredictable    Clinical Decision Making  High    Rehab Potential  Fair    PT Frequency  2x / week    PT Duration  Other  (comment)   10 weeks   PT Treatment/Interventions  ADLs/Self Care Home Management;Aquatic Therapy;Biofeedback;Moist Heat;Electrical Stimulation;Cryotherapy;Traction;Ultrasound;Therapeutic activities;Functional mobility training;Stair training;Gait training;Therapeutic exercise;Balance training;Neuromuscular re-education;Patient/family education;Manual techniques;Dry needling;Scar mobilization;Energy conservation;Taping;Joint Manipulations;Spinal Manipulations;Visual/perceptual remediation/compensation    PT Next Visit Plan  walk Pt. through self internal release PRN, rotational mobilizations through T-L junction, more scar-release and cupping at suprapubic scar, MFR around B ilium. re-assess ROM/back pain. TPDN to multifidus through T-L  junction with E-stim at 40hz  and at 2 hz. continue to work on thoracolumbar rotation ROM and decreasing spasms, add SLS balance training, begin strengthening for reciprocal inhibition. Internal pelvic exam,    PT Home Exercise Plan  diaphragmatic breathing, seated posture (lumbar support and avoiding adduction, low back stretch, side-stretch, hamstring stretch, butterfly stretch, bow-and-arrow, TENS trial, squatty potty, bowel retraining, toileting tips, colonic massage, thoracic extensions and supine chest stretch with towel roll, hip EXT in prone, child's pose., self internal TP release.    Consulted and Agree with Plan of Care  Patient       Patient will benefit from  skilled therapeutic intervention in order to improve the following deficits and impairments:  Abnormal gait, Decreased balance, Decreased endurance, Decreased mobility, Difficulty walking, Increased muscle spasms, Impaired sensation, Improper body mechanics, Impaired tone, Decreased activity tolerance, Decreased coordination, Decreased strength, Postural dysfunction, Pain  Visit Diagnosis: Other muscle spasm  Muscle weakness (generalized)  Difficulty in walking, not elsewhere  classified     Problem List Patient Active Problem List   Diagnosis Date Noted  . Seizure (HCC) 11/11/2018  . Major depressive disorder, recurrent episode, moderate (HCC) 02/07/2018  . Nephrolithiasis 04/16/2016  . Numbness 07/28/2015  . Bladder retention 06/23/2015  . Abdominal pain 06/04/2015  . Dizziness 05/18/2015  . Neck pain 05/18/2015  . Complicated migraine 04/28/2015  . Other fatigue 04/28/2015  . Abnormal finding on MRI of brain 04/28/2015  . D (diarrhea) 03/29/2015  . H/O disease 03/29/2015  . Abnormal weight loss 03/29/2015  . Muscle weakness (generalized) 03/14/2015  . Headache, migraine 03/10/2015   Cleophus Molt DPT, ATC Cleophus Molt 06/10/2019, 11:52 AM  [facility] MAIN [facility] SERVICES [address] [place], [place], [address] Phone: [phone]   Fax:  [phone]  Name: Larissa Ollivierre MRN: [id number] Date of Birth: February 28, 1981

## 2019-06-09 NOTE — Patient Instructions (Signed)
1) use digital stimulation around the rectum to encourage a BM.  2) use thumb vaginally to assist emptying the colon.  3) "prime the pump" by pressing on and off just under the ostomy to encourage movement from the small to large intestine.

## 2019-06-11 ENCOUNTER — Ambulatory Visit: Payer: BC Managed Care – PPO

## 2019-06-11 ENCOUNTER — Other Ambulatory Visit: Payer: Self-pay

## 2019-06-11 DIAGNOSIS — M62838 Other muscle spasm: Secondary | ICD-10-CM

## 2019-06-11 DIAGNOSIS — R262 Difficulty in walking, not elsewhere classified: Secondary | ICD-10-CM

## 2019-06-11 DIAGNOSIS — M6281 Muscle weakness (generalized): Secondary | ICD-10-CM

## 2019-06-11 NOTE — Therapy (Signed)
[facility] MAIN [facility] SERVICES [address] [place], [place], [address] Phone: [phone]   Fax:  [phone]  Physical Therapy Treatment  The patient has been informed of current processes in place at Outpatient Rehab to protect patients from Covid-19 exposure including social distancing, schedule modifications, and new cleaning procedures. After discussing their particular risk with a therapist based on the patient's personal risk factors, the patient has decided to proceed with in-person therapy.   Patient Details  Name: Shoua Ulloa MRN: [id number] Date of Birth: [birth year] Referring Provider (PT): [name]   Encounter Date: 06/11/2019  PT End of Session - 06/11/19 1516    Visit Number  21    Number of Visits  40    Date for PT Re-Evaluation  05/21/19    Authorization Type  BCBS    Authorization Time Period  from 03/12/2019 through 05/21/2019    Authorization - Visit Number  1    Authorization - Number of Visits  20    Progress Note Due on Visit  30    PT Start Time  1005    PT Stop Time  1105    PT Time Calculation (min)  60 min    Activity Tolerance  No increased pain;Patient tolerated treatment well    Behavior During Therapy  [facility] for tasks assessed/performed       Past Medical History:  Diagnosis Date  . Complication of anesthesia    ? seizures after anesthesia   . Headache   . Migraines   . Neurogenic bladder   . Renal disorder   . Vision abnormalities     Past Surgical History:  Procedure Laterality Date  . ANTERIOR CRUCIATE LIGAMENT REPAIR  1997  . APPENDECTOMY    . COLONOSCOPY WITH PROPOFOL N/A 11/11/2018   Procedure: COLONOSCOPY WITH PROPOFOL;  Surgeon: Christena Deem, MD;  Location: [facility] ENDOSCOPY;  Service: Endoscopy;  Laterality: N/A;  . CYSTOSCOPY WITH STENT PLACEMENT Right 04/17/2016   Procedure: CYSTOSCOPY WITH STENT PLACEMENT;  Surgeon: Malen Gauze, MD;  Location: [facility] ORS;   Service: Urology;  Laterality: Right;  . ESOPHAGOGASTRODUODENOSCOPY (EGD) WITH PROPOFOL N/A 11/11/2018   Procedure: ESOPHAGOGASTRODUODENOSCOPY (EGD) WITH PROPOFOL;  Surgeon: Christena Deem, MD;  Location: [facility] ENDOSCOPY;  Service: Endoscopy;  Laterality: N/A;  . EXPLORATORY LAPAROTOMY  1999  . KIDNEY STONE SURGERY  04/2016  . REVISION UROSTOMY CUTANEOUS    . REVISION UROSTOMY CUTANEOUS  01/10/2018  . SUPRAPUBIC CATHETER PLACEMENT  08/2017  . TONSILLECTOMY      There were no vitals filed for this visit.     Pelvic Floor Physical Therapy Treatment Note  SCREENING  Changes in medications, allergies, or medical history?: Is taking an antibiotic for her bladder infection.  SUBJECTIVE  Patient reports: Was able to have a BM yesterday with taking 2 linzess and using a suppository but still does not feel "back to normal" tried using all the manual techniques but still needed the suppository. Thinks that doing the ileocecal compressions probably helped some.   Precautions:  Neurological diagnosis pending, has "non-specific" brain lesions   Pain update:  Location of pain: R rib-cage/side (pelvic area) Current pain:  4/10 (3/10, increases with motion) Max pain:  8/10 (7/10 when it spasms) Least pain:  3/10 (3) Nature of pain: sharp to dull ache   Patient Goals: Get rid of the discomfort with catheterizing and ache/bladder pain. Be able to take fewer medications and be able to enjoy spending  time with her family again, doing "all the mom things"    OBJECTIVE  Changes in: Range of Motion/Flexibilty:  (from 12/10) Spine: ~ 2-3 fingers from knee with SB B. ~ 50% reduced R rotation with stretch in L, ~ 75% reduced L rotation with increased tightness. Forward bend: 19 in.from floor. Hips: Unable to achieve neutral pelvis in long-sitting due to hamstring tightness.  Today: ~ 30% reduced ROM in R rotation pre-treatment with pain end-feel.  -~ 15% reduced R ROT ROM following  treatment with pain end-feel   Clonus: 2-3 beats on R 3-4 beats on L Tone: no cog-wheel rigidity noted today  -Pt. Describes having clonus with weightbearing occasionally describes as "tremors"   Strength/MMT:  LE MMT (from 12-10) LE MMT Left Right  Hip flex:  (L2) /5 /5  Hip ext: 3+/5 3+/5  Hip abd: 4/5 4/5  Hip add: 4/5 3+/5  Hip IR 4/5 3+/5  Hip ER 4/5 4/5   Pelvic Floor External Exam: (From prior session)- [Introitus Appears: elevated Skin integrity: WNL Palpation: TTP to B STP Cough: limited motion Prolapse visible?: no Scar mobility: not assessed  Internal Vaginal Exam: Strength (PERF): Pt. Has difficulty sensing the PFM to generate contraction or relaxation due to high tone/neuro involvement Symmetry: L>R for spasm/TTP Palpation: TTP to all muscles throughout B Prolapse: none]  Today: decreased resistance with initial penetration, TTP through all major areas as palpated by Pt. With tool during training and especially near coccyx.  Palpation:  (from previous session) Decreased scar mobility along midline suprapubic scar which increases/recreates bladder "twitching" sensation which is a mild version of the intense bladder spasms she often experiences.  TTP to OI R Glute min, Piriformis, adductors and hip-flexors (from 12-10)  Gait Analysis:  INTERVENTIONS THIS SESSION:  Manual:  Performed TP release internally to B coccygeus to decrease spasm and pain and allow for improved balance of musculature for improved function and decreased symptoms and reviewed coordination of squeeze, relax, and bulge to aid with bowel emptying.  Self-care: Educated on how to perform self internal TP release with tool to decrease spasm and pain and allow for improved balance of musculature for improved function and decreased symptoms and improve efficacy of HEP for faster symptom reduction.     Total time: 60 min.                              PT Short  Term Goals - 06/10/19 1116      PT SHORT TERM GOAL #1   Title  Patient will demonstrate coordinated diaphragmatic breathing with pelvic tilts to demonstrate improved control of diaphragm and TA, to allow for further strengthening of core musculature and decreased pelvic floor spasm.    Baseline  Pt. demonstrates breathing dysfunction and poor PFM coordination evidenced by anal manometry As of 2/22: Pt. continues to have restriction in her diaphragm and near T/L junction but is able to intentionally use diaphragmatic breathing through the available ROM.    Time  5    Period  Weeks    Status  Achieved    Target Date  04/20/19      PT SHORT TERM GOAL #2   Title  Patient will report a reduction in pain to no greater than 6/10 over the prior week to demonstrate symptom improvement.    Baseline  Pain is 10/10 at worst, 2/10 at best As of 2/22: Pt. had achieved this goal for 1  week as of 1/21 but may have infection or other insult that caused pain to increase again.    Time  5    Period  Weeks    Status  On-going    Target Date  04/20/19      PT SHORT TERM GOAL #3   Title  Patient will demonstrate HEP x1 in the clinic to demonstrate understanding and proper form to allow for further improvement.    Baseline  Pt. lacks knowledge of therepeutic exercises that can decrease her pain/Sx.    Time  5    Period  Weeks    Status  Achieved    Target Date  04/20/19      PT SHORT TERM GOAL #4   Title  Patient will report consistent use of foot-stool (squatty-potty) for positioning with BM to decrease pain with BM and intra-abdominal pressure.    Baseline  Pt. having constipation due to PFM dysfunction    Time  5    Period  Weeks    Status  Achieved    Target Date  04/20/19        PT Long Term Goals - 06/10/19 0001      PT LONG TERM GOAL #1   Title  Pt. will be able to participate in regular ADL's with least restrictive device without pain increasing greater than 2/10    Baseline  Pt. limited in  her ability to perform household duties by increased pain and fatigue.    Time  10    Period  Weeks    Status  On-going    Target Date  08/19/19      PT LONG TERM GOAL #2   Title  Patient will score at or below 65/300  on the PFDI and 35% on the Female NIH-CPSI to demonstrate a clinically meaningful decrease in disability and distress due to pelvic floor dysfunction.    Baseline  PFDI: 110/300, Female NIH-CPSI: 29/43 (67%)    Time  10    Period  Weeks    Status  Unable to assess    Target Date  08/19/19      PT LONG TERM GOAL #3   Title  Patient will report no pain with intercourse to demonstrate improved functional ability.    Baseline  Pt. Having significant pain with intercourse    Time  10    Period  Weeks    Status  On-going    Target Date  08/19/19      PT LONG TERM GOAL #4   Title  Pt will report ability to work in her yard for greater than 30 minutes without extreme fatigue    Baseline  limited to 10-15 minutes before pt requires ending activity    Time  10    Period  Weeks    Status  On-going    Target Date  08/19/19      PT LONG TERM GOAL #5   Title  Pt. will be able to go 2-3 hours between emptying her bladder without increased pain and empty her bladder fully whether by urostomy, cath, or voluntary release.    Baseline  Pt. has a urostomy but continues to have to self-catheterize. Has pain with bladder filling and when using catheter.    Time  10    Period  Weeks    Status  On-going    Target Date  08/19/19      PT LONG TERM GOAL #6   Title  Patient  will report having BM's at least every-other day with consistency between [facility] stool scale 3-5 over the prior week to demonstrate decreased constipation.    Baseline  Pt. unable to have regula BM's without medication, manometry shows PFM dysfunction    Time  10    Period  Weeks    Status  On-going    Target Date  08/19/19            Plan - 06/11/19 1517    Clinical Impression Statement  Pt. Responded  well to all interventions today, demonstrating decreased spasms and improved understanding and correct performance of all education and exercises provided today. They will continue to benefit from skilled physical therapy to work toward remaining goals and maximize function as well as decrease likelihood of symptom increase or recurrence.     PT Next Visit Plan  rotational mobilizations through T-L junction, more scar-release and cupping at suprapubic scar, MFR around B ilium. re-assess ROM/back pain. TPDN to multifidus through T-L  junction with E-stim at 40hz  and at 2 hz. continue to work on thoracolumbar rotation ROM and decreasing spasms, add SLS balance training, begin strengthening for reciprocal inhibition. Internal pelvic exam,    PT Home Exercise Plan  diaphragmatic breathing, seated posture (lumbar support and avoiding adduction, low back stretch, side-stretch, hamstring stretch, butterfly stretch, bow-and-arrow, TENS trial, squatty potty, bowel retraining, toileting tips, colonic massage, thoracic extensions and supine chest stretch with towel roll, hip EXT in prone, child's pose., self internal TP release.    Consulted and Agree with Plan of Care  Patient       Patient will benefit from skilled therapeutic intervention in order to improve the following deficits and impairments:     Visit Diagnosis: Other muscle spasm  Muscle weakness (generalized)  Difficulty in walking, not elsewhere classified     Problem List Patient Active Problem List   Diagnosis Date Noted  . Seizure (HCC) 11/11/2018  . Major depressive disorder, recurrent episode, moderate (HCC) 02/07/2018  . Nephrolithiasis 04/16/2016  . Numbness 07/28/2015  . Bladder retention 06/23/2015  . Abdominal pain 06/04/2015  . Dizziness 05/18/2015  . Neck pain 05/18/2015  . Complicated migraine 04/28/2015  . Other fatigue 04/28/2015  . Abnormal finding on MRI of brain 04/28/2015  . D (diarrhea) 03/29/2015  . H/O  disease 03/29/2015  . Abnormal weight loss 03/29/2015  . Muscle weakness (generalized) 03/14/2015  . Headache, migraine 03/10/2015   03/12/2015 DPT, ATC Cleophus Molt 06/11/2019, 3:24 PM  [facility] MAIN [facility] SERVICES [address] [place], [place], [place] Phone: [phone]   Fax:  [phone]  Name: Wyona Neils MRN: [name] Date of Birth: 1980-11-06

## 2019-06-16 ENCOUNTER — Other Ambulatory Visit: Payer: Self-pay

## 2019-06-16 ENCOUNTER — Ambulatory Visit: Payer: BC Managed Care – PPO

## 2019-06-16 ENCOUNTER — Other Ambulatory Visit: Payer: Self-pay | Admitting: Gastroenterology

## 2019-06-16 ENCOUNTER — Ambulatory Visit
Admission: RE | Admit: 2019-06-16 | Discharge: 2019-06-16 | Disposition: A | Discharge status: Home / Self Care | Payer: BC Managed Care – PPO | Source: Ambulatory Visit | Attending: Gastroenterology | Admitting: Gastroenterology

## 2019-06-16 DIAGNOSIS — R1011 Right upper quadrant pain: Secondary | ICD-10-CM

## 2019-06-18 ENCOUNTER — Ambulatory Visit: Payer: BC Managed Care – PPO | Attending: Gastroenterology

## 2019-06-18 ENCOUNTER — Other Ambulatory Visit: Payer: Self-pay

## 2019-06-18 DIAGNOSIS — M6281 Muscle weakness (generalized): Secondary | ICD-10-CM | POA: Insufficient documentation

## 2019-06-18 DIAGNOSIS — M62838 Other muscle spasm: Secondary | ICD-10-CM

## 2019-06-18 DIAGNOSIS — R262 Difficulty in walking, not elsewhere classified: Secondary | ICD-10-CM | POA: Insufficient documentation

## 2019-06-18 NOTE — Therapy (Signed)
[place] MAIN [facility] SERVICES [address] [place], [place], [address] Phone: [phone]   Fax:  [phone]  Physical Therapy Treatment  The patient has been informed of current processes in place at Outpatient Rehab to protect patients from Covid-19 exposure including social distancing, schedule modifications, and new cleaning procedures. After discussing their particular risk with a therapist based on the patient's personal risk factors, the patient has decided to proceed with in-person therapy.   Patient Details  Name: Christine Chavez MRN: [id number] Date of Birth: 12/03/1980 Referring Provider (PT): [name]   Encounter Date: 06/18/2019  PT End of Session - 06/19/19 1609    Visit Number  22    Number of Visits  40    Date for PT Re-Evaluation  05/21/19    Authorization Type  BCBS    Authorization Time Period  from 03/12/2019 through 05/21/2019    Authorization - Visit Number  2    Authorization - Number of Visits  20    Progress Note Due on Visit  30    PT Start Time  1005    PT Stop Time  1105    PT Time Calculation (min)  60 min    Activity Tolerance  Patient tolerated treatment well;Other (comment)   treatment limited by high tone from UTI   Behavior During Therapy  [facility] for tasks assessed/performed       Past Medical History:  Diagnosis Date  . Complication of anesthesia    ? seizures after anesthesia   . Headache   . Migraines   . Neurogenic bladder   . Renal disorder   . Vision abnormalities     Past Surgical History:  Procedure Laterality Date  . ANTERIOR CRUCIATE LIGAMENT REPAIR  1997  . APPENDECTOMY    . COLONOSCOPY WITH PROPOFOL N/A 11/11/2018   Procedure: COLONOSCOPY WITH PROPOFOL;  Surgeon: [name], MD;  Location: [facility] ENDOSCOPY;  Service: Endoscopy;  Laterality: N/A;  . CYSTOSCOPY WITH STENT PLACEMENT Right 04/17/2016   Procedure: CYSTOSCOPY WITH STENT PLACEMENT;  Surgeon: [name], MD;  Location: [facility] ORS;  Service: Urology;  Laterality: Right;  . ESOPHAGOGASTRODUODENOSCOPY (EGD) WITH PROPOFOL N/A 11/11/2018   Procedure: ESOPHAGOGASTRODUODENOSCOPY (EGD) WITH PROPOFOL;  Surgeon: [name], MD;  Location: [facility] ENDOSCOPY;  Service: Endoscopy;  Laterality: N/A;  . EXPLORATORY LAPAROTOMY  1999  . KIDNEY STONE SURGERY  04/2016  . REVISION UROSTOMY CUTANEOUS    . REVISION UROSTOMY CUTANEOUS  01/10/2018  . SUPRAPUBIC CATHETER PLACEMENT  08/2017  . TONSILLECTOMY      There were no vitals filed for this visit.    Pelvic Floor Physical Therapy Treatment Note  SCREENING  Changes in medications, allergies, or medical history?: Ultrasound and panel were clear. The GI doc thought that she is having a flare up because an infection. She needs to start taking Bactrim for an infection and Mirilax every other day. Confirmed UTI. Asked about the spasticity pain meds and they want to defer to the pain clinic. Pain clinic said they are going to start her on Baclofen, up to 3 times per day.  SUBJECTIVE  Patient reports: She is having pain on the RLQ still. Friday used a suppository and an enema and nothing came out except the suppository and enema two days in a row. On Sunday she went to church but felt bad that evening, finally that night she was able to have a BM after a third enema and  suppository, coffee, and eating prunes. Her GI MD did not think she has a blockage.     Precautions:  Neurological diagnosis pending, has "non-specific" brain lesions   Pain update:  Location of pain: B flank pain (pelvic area) Current pain:  4/10 (4/10, increases with motion) Max pain:  8/10 (8/10 when it spasms) Least pain:  3/10 (3) Nature of pain: sharp to dull ache   Patient Goals: Get rid of the discomfort with catheterizing and ache/bladder pain. Be able to take fewer medications and be able to enjoy spending time with her family again, doing "all the mom things"     OBJECTIVE  Changes in: Range of Motion/Flexibilty:  (from 12/10) Spine: ~ 2-3 fingers from knee with SB B. ~ 50% reduced R rotation with stretch in L, ~ 75% reduced L rotation with increased tightness. Forward bend: 19 in.from floor. Hips: Unable to achieve neutral pelvis in long-sitting due to hamstring tightness.   Strength/MMT:  LE MMT (from 12-10) LE MMT Left Right  Hip flex:  (L2) /5 /5  Hip ext: 3+/5 3+/5  Hip abd: 4/5 4/5  Hip add: 4/5 3+/5  Hip IR 4/5 3+/5  Hip ER 4/5 4/5   Pelvic Floor External Exam: (From prior session)- [Introitus Appears: elevated Skin integrity: WNL Palpation: TTP to B STP Cough: limited motion Prolapse visible?: no Scar mobility: not assessed  Internal Vaginal Exam: Strength (PERF): Pt. Has difficulty sensing the PFM to generate contraction or relaxation due to high tone/neuro involvement Symmetry: L>R for spasm/TTP Palpation: TTP to all muscles throughout B Prolapse: none]  Palpation:  TTP to B temporalis, masseter, and SCM   Gait Analysis:  INTERVENTIONS THIS SESSION:  Manual: Aattempted to perform TP release to temporalis B with ~ 50% success, performed passive chest stretch overpressure with grade 1-2 mobs to decrease FHP, decrease tension in shoulders for down-regulation of pain and spasm while waiting for medications to work.  Self-care: Discussed how to help determine whether the different medications are working and how to differentiate the whole body tightness from the PFM spasms but understand that they also build on each other. Planned to wait until next week for follow-up due to increased tone making it very difficult to sustain progress and her overall pain/spasm at this time.     Total time: 60 min.                            PT Short Term Goals - 06/10/19 1116      PT SHORT TERM GOAL #1   Title  Patient will demonstrate coordinated diaphragmatic breathing with pelvic tilts to  demonstrate improved control of diaphragm and TA, to allow for further strengthening of core musculature and decreased pelvic floor spasm.    Baseline  Pt. demonstrates breathing dysfunction and poor PFM coordination evidenced by anal manometry As of 2/22: Pt. continues to have restriction in her diaphragm and near T/L junction but is able to intentionally use diaphragmatic breathing through the available ROM.    Time  5    Period  Weeks    Status  Achieved    Target Date  04/20/19      PT SHORT TERM GOAL #2   Title  Patient will report a reduction in pain to no greater than 6/10 over the prior week to demonstrate symptom improvement.    Baseline  Pain is 10/10 at worst, 2/10 at best As of 2/22: Pt. had achieved  this goal for 1 week as of 1/21 but may have infection or other insult that caused pain to increase again.    Time  5    Period  Weeks    Status  On-going    Target Date  04/20/19      PT SHORT TERM GOAL #3   Title  Patient will demonstrate HEP x1 in the clinic to demonstrate understanding and proper form to allow for further improvement.    Baseline  Pt. lacks knowledge of therepeutic exercises that can decrease her pain/Sx.    Time  5    Period  Weeks    Status  Achieved    Target Date  04/20/19      PT SHORT TERM GOAL #4   Title  Patient will report consistent use of foot-stool (squatty-potty) for positioning with BM to decrease pain with BM and intra-abdominal pressure.    Baseline  Pt. having constipation due to PFM dysfunction    Time  5    Period  Weeks    Status  Achieved    Target Date  04/20/19        PT Long Term Goals - 06/10/19 0001      PT LONG TERM GOAL #1   Title  Pt. will be able to participate in regular ADL's with least restrictive device without pain increasing greater than 2/10    Baseline  Pt. limited in her ability to perform household duties by increased pain and fatigue.    Time  10    Period  Weeks    Status  On-going    Target Date   08/19/19      PT LONG TERM GOAL #2   Title  Patient will score at or below 65/300  on the PFDI and 35% on the Female NIH-CPSI to demonstrate a clinically meaningful decrease in disability and distress due to pelvic floor dysfunction.    Baseline  PFDI: 110/300, Female NIH-CPSI: 29/43 (67%)    Time  10    Period  Weeks    Status  Unable to assess    Target Date  08/19/19      PT LONG TERM GOAL #3   Title  Patient will report no pain with intercourse to demonstrate improved functional ability.    Baseline  Pt. Having significant pain with intercourse    Time  10    Period  Weeks    Status  On-going    Target Date  08/19/19      PT LONG TERM GOAL #4   Title  Pt will report ability to work in her yard for greater than 30 minutes without extreme fatigue    Baseline  limited to 10-15 minutes before pt requires ending activity    Time  10    Period  Weeks    Status  On-going    Target Date  08/19/19      PT LONG TERM GOAL #5   Title  Pt. will be able to go 2-3 hours between emptying her bladder without increased pain and empty her bladder fully whether by urostomy, cath, or voluntary release.    Baseline  Pt. has a urostomy but continues to have to self-catheterize. Has pain with bladder filling and when using catheter.    Time  10    Period  Weeks    Status  On-going    Target Date  08/19/19      PT LONG TERM GOAL #6  Title  Patient will report having BM's at least every-other day with consistency between [facility] stool scale 3-5 over the prior week to demonstrate decreased constipation.    Baseline  Pt. unable to have regula BM's without medication, manometry shows PFM dysfunction    Time  10    Period  Weeks    Status  On-going    Target Date  08/19/19            Plan - 06/19/19 1610    Clinical Impression Statement  Pt. Responded well to all interventions today, demonstrating decreased tension through the neck/shoulders and improved seated posture as well as  understanding and correct performance of all education and exercises provided today. They will continue to benefit from skilled physical therapy to work toward remaining goals and maximize function as well as decrease likelihood of symptom increase or recurrence.     PT Next Visit Plan  rotational mobilizations through T-L junction, more scar-release and cupping at suprapubic scar, MFR around B ilium. re-assess ROM/back pain. TPDN to multifidus through T-L  junction with E-stim at 40hz  and at 2 hz. continue to work on thoracolumbar rotation ROM and decreasing spasms, add SLS balance training, begin strengthening for reciprocal inhibition. Internal pelvic exam,    PT Home Exercise Plan  diaphragmatic breathing, seated posture (lumbar support and avoiding adduction, low back stretch, side-stretch, hamstring stretch, butterfly stretch, bow-and-arrow, TENS trial, squatty potty, bowel retraining, toileting tips, colonic massage, thoracic extensions and supine chest stretch with towel roll, hip EXT in prone, child's pose., self internal TP release.    Consulted and Agree with Plan of Care  Patient       Patient will benefit from skilled therapeutic intervention in order to improve the following deficits and impairments:     Visit Diagnosis: Other muscle spasm  Muscle weakness (generalized)  Difficulty in walking, not elsewhere classified     Problem List Patient Active Problem List   Diagnosis Date Noted  . Seizure (HCC) 11/11/2018  . Major depressive disorder, recurrent episode, moderate (HCC) 02/07/2018  . Nephrolithiasis 04/16/2016  . Numbness 07/28/2015  . Bladder retention 06/23/2015  . Abdominal pain 06/04/2015  . Dizziness 05/18/2015  . Neck pain 05/18/2015  . Complicated migraine 04/28/2015  . Other fatigue 04/28/2015  . Abnormal finding on MRI of brain 04/28/2015  . D (diarrhea) 03/29/2015  . H/O disease 03/29/2015  . Abnormal weight loss 03/29/2015  . Muscle weakness  (generalized) 03/14/2015  . Headache, migraine 03/10/2015   03/12/2015 DPT, ATC Cleophus Molt 06/19/2019, 4:11 PM  [facility] MAIN [facility] SERVICES [address] [place], [place], [place] Phone: [phone]   Fax:  [phone]  Name: Roslind Michaux MRN: [name] Date of Birth: [birth year]

## 2019-06-19 ENCOUNTER — Ambulatory Visit: Payer: BC Managed Care – PPO

## 2019-06-23 ENCOUNTER — Ambulatory Visit: Payer: BC Managed Care – PPO

## 2019-06-23 ENCOUNTER — Other Ambulatory Visit: Payer: Self-pay

## 2019-06-23 DIAGNOSIS — M6281 Muscle weakness (generalized): Secondary | ICD-10-CM

## 2019-06-23 DIAGNOSIS — R262 Difficulty in walking, not elsewhere classified: Secondary | ICD-10-CM

## 2019-06-23 DIAGNOSIS — M62838 Other muscle spasm: Secondary | ICD-10-CM | POA: Diagnosis not present

## 2019-06-23 NOTE — Therapy (Signed)
[facility] MAIN [facility] SERVICES [address] [place], [place], [address] Phone: [phone]   Fax:  [phone]  Physical Therapy Treatment  The patient has been informed of current processes in place at [facility]patient [facility]hab to protect patients from Covid-19 exposure including social distancing, schedule modifications, and new cleaning procedures. After discussing their particular risk with a therapist based on the patient's personal risk factors, the patient has decided to proceed with in-person therapy.   Patient Details  Name: Christine Chavez MRN: [id number] Date of Birth: [birth year] Referring Provider (PT): [name]   Encounter Date: 06/23/2019  PT End of Session - 06/24/19 0836    Visit Number  23    Number of Visits  40    Date for PT Re-Evaluation  05/21/19    Authorization Type  [employer]    Authorization Time Period  from 03/12/2019 through 05/21/2019    Authorization - Visit Number  3    Authorization - Number of Visits  20    Progress Note Due on Visit  30    PT Start Time  0940    PT Stop Time  1040    PT Time Calculation (min)  60 min    Activity Tolerance  Patient tolerated treatment well;Other (comment)   treatment limited by high tone from UTI   Behavior During Therapy  [facility] for tasks assessed/performed       Past Medical History:  Diagnosis Date  . Complication of anesthesia    ? seizures after anesthesia   . Headache   . Migraines   . Neurogenic bladder   . Renal disorder   . Vision abnormalities     Past Surgical History:  Procedure Laterality Date  . ANTERIOR CRUCIATE LIGAMENT REPAIR  1997  . APPENDECTOMY    . COLONOSCOPY WITH PROPOFOL N/A 11/11/2018   Procedure: COLONOSCOPY WITH PROPOFOL;  Surgeon: Christena Deem, MD;  Location: [facility] ENDOSCOPY;  Service: Endoscopy;  Laterality: N/A;  . CYSTOSCOPY WITH STENT PLACEMENT Right 04/17/2016   Procedure: CYSTOSCOPY WITH STENT PLACEMENT;  Surgeon: Malen Gauze, MD;  Location: [facility] ORS;  Service: Urology;  Laterality: Right;  . ESOPHAGOGASTRODUODENOSCOPY (EGD) WITH PROPOFOL N/A 11/11/2018   Procedure: ESOPHAGOGASTRODUODENOSCOPY (EGD) WITH PROPOFOL;  Surgeon: Christena Deem, MD;  Location: [facility] ENDOSCOPY;  Service: Endoscopy;  Laterality: N/A;  . EXPLORATORY LAPAROTOMY  1999  . KIDNEY STONE SURGERY  04/2016  . REVISION UROSTOMY CUTANEOUS    . REVISION UROSTOMY CUTANEOUS  01/10/2018  . SUPRAPUBIC CATHETER PLACEMENT  08/2017  . TONSILLECTOMY      There were no vitals filed for this visit.    Pelvic Floor Physical Therapy Treatment Note  SCREENING  Changes in medications, allergies, or medical history?: Ultrasound and panel were clear. The GI doc thought that she is having a flare up because an infection. She needs to start taking Bactrim for an infection and Mirilax every other day. Confirmed UTI. Asked about the spasticity pain meds and they want to defer to the pain clinic. Pain clinic said they are going to start her on Baclofen, up to 3 times per day.  SUBJECTIVE  Patient reports: Took Mirilax 2 days in a row and then Took a laxative on Thursaday, felt  Bad overnight but had a BM on Friday morning and still felt sick all day on Friday. Overall is feeling like the pain is less intense sometimes, still bursts of intensity. Hopes things are on an improving course.  Precautions:  Neurological diagnosis pending, has "non-specific" brain lesions   Pain update:  Location of pain: B flank pain (pelvic area) Current pain:  4/10 (4/10, increases with motion) Max pain:  8/10 (8/10 when it spasms) Least pain:  3/10 (3) Nature of pain: sharp to dull ache   Patient Goals: Get rid of the discomfort with catheterizing and ache/bladder pain. Be able to take fewer medications and be able to enjoy spending time with her family again, doing "all the mom things"    OBJECTIVE  Changes in: Range of Motion/Flexibilty:  (from  12/10) Spine: ~ 2-3 fingers from knee with SB B. ~ 50% reduced R rotation with stretch in L, ~ 75% reduced L rotation with increased tightness. Forward bend: 19 in.from floor. Hips: Unable to achieve neutral pelvis in long-sitting due to hamstring tightness.   Strength/MMT:  LE MMT (from 12-10) LE MMT Left Right  Hip flex:  (L2) /5 /5  Hip ext: 3+/5 3+/5  Hip abd: 4/5 4/5  Hip add: 4/5 3+/5  Hip IR 4/5 3+/5  Hip ER 4/5 4/5   Pelvic Floor External Exam: (From prior session)- [Introitus Appears: elevated Skin integrity: WNL Palpation: TTP to B STP Cough: limited motion Prolapse visible?: no Scar mobility: not assessed  Internal Vaginal Exam: Strength (PERF): Pt. Has difficulty sensing the PFM to generate contraction or relaxation due to high tone/neuro involvement Symmetry: L>R for spasm/TTP Palpation: TTP to all muscles throughout B Prolapse: none]  Palpation:  TTP to B Iliacus, Piriformis, and lumbar multifidus at L5  Gait Analysis:  INTERVENTIONS THIS SESSION:  Manual: Performed TP release and STM to B Iliacus, Piriformis, and lumbar multifidus at L5 to decrease spasm and pain and allow for improved balance of musculature for improved function and decreased symptoms.  Performed TPDN with a .30x61mm and .30x194mm needles and standard approach as described below to decrease spasm and pain and allow for improved balance of musculature for improved function and decreased symptoms.  E-stim: Performed attended E-stim with TPDN to Iliacus, Piriformis, and lumbar multifidus at L5 for 5 min. At each location (2 different positions for Iliacus) at a strong-tingle intensity with frequency of 30 mA.     Total time: 60 min.                           PT Short Term Goals - 06/10/19 1116      PT SHORT TERM GOAL #1   Title  Patient will demonstrate coordinated diaphragmatic breathing with pelvic tilts to demonstrate improved control of diaphragm and TA, to  allow for further strengthening of core musculature and decreased pelvic floor spasm.    Baseline  Pt. demonstrates breathing dysfunction and poor PFM coordination evidenced by anal manometry As of 2/22: Pt. continues to have restriction in her diaphragm and near T/L junction but is able to intentionally use diaphragmatic breathing through the available ROM.    Time  5    Period  Weeks    Status  Achieved    Target Date  04/20/19      PT SHORT TERM GOAL #2   Title  Patient will report a reduction in pain to no greater than 6/10 over the prior week to demonstrate symptom improvement.    Baseline  Pain is 10/10 at worst, 2/10 at best As of 2/22: Pt. had achieved this goal for 1 week as of 1/21 but may have infection or other insult that caused pain  to increase again.    Time  5    Period  Weeks    Status  On-going    Target Date  04/20/19      PT SHORT TERM GOAL #3   Title  Patient will demonstrate HEP x1 in the clinic to demonstrate understanding and proper form to allow for further improvement.    Baseline  Pt. lacks knowledge of therepeutic exercises that can decrease her pain/Sx.    Time  5    Period  Weeks    Status  Achieved    Target Date  04/20/19      PT SHORT TERM GOAL #4   Title  Patient will report consistent use of foot-stool (squatty-potty) for positioning with BM to decrease pain with BM and intra-abdominal pressure.    Baseline  Pt. having constipation due to PFM dysfunction    Time  5    Period  Weeks    Status  Achieved    Target Date  04/20/19        PT Long Term Goals - 06/10/19 0001      PT LONG TERM GOAL #1   Title  Pt. will be able to participate in regular ADL's with least restrictive device without pain increasing greater than 2/10    Baseline  Pt. limited in her ability to perform household duties by increased pain and fatigue.    Time  10    Period  Weeks    Status  On-going    Target Date  08/19/19      PT LONG TERM GOAL #2   Title  Patient will  score at or below 65/300  on the PFDI and 35% on the Female NIH-CPSI to demonstrate a clinically meaningful decrease in disability and distress due to pelvic floor dysfunction.    Baseline  PFDI: 110/300, Female NIH-CPSI: 29/43 (67%)    Time  10    Period  Weeks    Status  Unable to assess    Target Date  08/19/19      PT LONG TERM GOAL #3   Title  Patient will report no pain with intercourse to demonstrate improved functional ability.    Baseline  Pt. Having significant pain with intercourse    Time  10    Period  Weeks    Status  On-going    Target Date  08/19/19      PT LONG TERM GOAL #4   Title  Pt will report ability to work in her yard for greater than 30 minutes without extreme fatigue    Baseline  limited to 10-15 minutes before pt requires ending activity    Time  10    Period  Weeks    Status  On-going    Target Date  08/19/19      PT LONG TERM GOAL #5   Title  Pt. will be able to go 2-3 hours between emptying her bladder without increased pain and empty her bladder fully whether by urostomy, cath, or voluntary release.    Baseline  Pt. has a urostomy but continues to have to self-catheterize. Has pain with bladder filling and when using catheter.    Time  10    Period  Weeks    Status  On-going    Target Date  08/19/19      PT LONG TERM GOAL #6   Title  Patient will report having BM's at least every-other day with consistency between [facility] stool scale  3-5 over the prior week to demonstrate decreased constipation.    Baseline  Pt. unable to have regula BM's without medication, manometry shows PFM dysfunction    Time  10    Period  Weeks    Status  On-going    Target Date  08/19/19            Plan - 06/24/19 0836    Clinical Impression Statement  Pt. Responded well to all interventions today, demonstrating decreased spasms and TTP as well as understanding and correct performance of all education and exercises provided today. They will continue to benefit from  skilled physical therapy to work toward remaining goals and maximize function as well as decrease likelihood of symptom increase or recurrence.     PT Next Visit Plan  Give I's Y's and T's in prone, rotational mobilizations through T-L junction, more scar-release and cupping at suprapubic scar, MFR around B ilium. re-assess ROM/back pain. TPDN to multifidus through T-L  junction with E-stim at 40hz  and at 2 hz. continue to work on thoracolumbar rotation ROM and decreasing spasms, add SLS balance training, begin strengthening for reciprocal inhibition. Internal pelvic exam,    PT Home Exercise Plan  diaphragmatic breathing, seated posture (lumbar support and avoiding adduction, low back stretch, side-stretch, hamstring stretch, butterfly stretch, bow-and-arrow, TENS trial, squatty potty, bowel retraining, toileting tips, colonic massage, thoracic extensions and supine chest stretch with towel roll, hip EXT in prone, child's pose., self internal TP release.    Consulted and Agree with Plan of Care  Patient       Patient will benefit from skilled therapeutic intervention in order to improve the following deficits and impairments:     Visit Diagnosis: Other muscle spasm  Muscle weakness (generalized)  Difficulty in walking, not elsewhere classified     Problem List Patient Active Problem List   Diagnosis Date Noted  . Seizure ([place]) 11/11/2018  . Major depressive disorder, recurrent episode, moderate ([place]) 02/07/2018  . Nephrolithiasis 04/16/2016  . Numbness 07/28/2015  . Bladder retention 06/23/2015  . Abdominal pain 06/04/2015  . Dizziness 05/18/2015  . Neck pain 05/18/2015  . Complicated migraine 16/96/7893  . Other fatigue 04/28/2015  . Abnormal finding on MRI of brain 04/28/2015  . D (diarrhea) 03/29/2015  . H/O disease 03/29/2015  . Abnormal weight loss 03/29/2015  . Muscle weakness (generalized) 03/14/2015  . Headache, migraine 03/10/2015   Willa Rough DPT, ATC Willa Rough 06/24/2019, 11:06 AM  [place] MAIN [facility] SERVICES [address] [place], [place], [address] Phone: [phone]   Fax:  [phone]  Name: Sam Wunschel MRN: [id number] Date of Birth: 03-Dec-1980

## 2019-06-25 ENCOUNTER — Ambulatory Visit: Payer: BC Managed Care – PPO

## 2019-06-25 ENCOUNTER — Other Ambulatory Visit: Payer: Self-pay

## 2019-06-25 DIAGNOSIS — M62838 Other muscle spasm: Secondary | ICD-10-CM

## 2019-06-25 DIAGNOSIS — M6281 Muscle weakness (generalized): Secondary | ICD-10-CM

## 2019-06-25 DIAGNOSIS — R262 Difficulty in walking, not elsewhere classified: Secondary | ICD-10-CM

## 2019-06-25 NOTE — Patient Instructions (Signed)
  Hip Abduction: Side Leg Lift - Side-Lying    Lie on side. Draw lower tummy muscle (TA) and pelvic floor in, Lift top leg until you feel strong contraction of muscle on the side of the hip. Keep top leg straight with body, toes pointing forward. Do not let your hip roll back! Do _10__ reps per set, __3_ sets per day  Hip Extension:    Lying face down, bend the knee and press the heel straight up toward the ceiling lifting the thigh just off floor by squeezing glutes. Hold 1 count. Lower leg to floor. Do _10__ reps per set, __3_ sets for each leg. Optional: Place small pillow under abdomen if back becomes uncomfortable.

## 2019-06-25 NOTE — Therapy (Signed)
[place] [facility] MAIN [facility] SERVICES [address] [place], [place], [address] Phone: [phone]   Fax:  [phone]  Physical Therapy Treatment  The patient has been informed of current processes in place at Outpatient Rehab to protect patients from Covid-19 exposure including social distancing, schedule modifications, and new cleaning procedures. After discussing their particular risk with a therapist based on the patient's personal risk factors, the patient has decided to proceed with in-person therapy.   Patient Details  Name: Christine Chavez MRN: [id number] Date of Birth: 03-Apr-1981 Referring Provider (PT): [name]   Encounter Date: 06/25/2019  PT End of Session - 06/25/19 1321    Visit Number  24    Number of Visits  40    Date for PT Re-Evaluation  05/21/19    Authorization Type  BCBS    Authorization Time Period  from 03/12/2019 through 05/21/2019    Authorization - Visit Number  4    Authorization - Number of Visits  20    Progress Note Due on Visit  30    PT Start Time  1005    PT Stop Time  1105    PT Time Calculation (min)  60 min    Activity Tolerance  Patient tolerated treatment well;Other (comment)   treatment limited by high tone from UTI   Behavior During Therapy  [facility] for tasks assessed/performed       Past Medical History:  Diagnosis Date  . Complication of anesthesia    ? seizures after anesthesia   . Headache   . Migraines   . Neurogenic bladder   . Renal disorder   . Vision abnormalities     Past Surgical History:  Procedure Laterality Date  . ANTERIOR CRUCIATE LIGAMENT REPAIR  1997  . APPENDECTOMY    . COLONOSCOPY WITH PROPOFOL N/A 11/11/2018   Procedure: COLONOSCOPY WITH PROPOFOL;  Surgeon: Christena Deem, MD;  Location: [facility] ENDOSCOPY;  Service: Endoscopy;  Laterality: N/A;  . CYSTOSCOPY WITH STENT PLACEMENT Right 04/17/2016   Procedure: CYSTOSCOPY WITH STENT PLACEMENT;  Surgeon: Malen Gauze, MD;  Location: [facility] ORS;  Service: Urology;  Laterality: Right;  . ESOPHAGOGASTRODUODENOSCOPY (EGD) WITH PROPOFOL N/A 11/11/2018   Procedure: ESOPHAGOGASTRODUODENOSCOPY (EGD) WITH PROPOFOL;  Surgeon: Christena Deem, MD;  Location: [facility] ENDOSCOPY;  Service: Endoscopy;  Laterality: N/A;  . EXPLORATORY LAPAROTOMY  1999  . KIDNEY STONE SURGERY  04/2016  . REVISION UROSTOMY CUTANEOUS    . REVISION UROSTOMY CUTANEOUS  01/10/2018  . SUPRAPUBIC CATHETER PLACEMENT  08/2017  . TONSILLECTOMY      There were no vitals filed for this visit.     Pelvic Floor Physical Therapy Treatment Note  SCREENING  Changes in medications, allergies, or medical history?: Has switched onto the daily antibiotic now for ~ 1 month, believes she has a yeast infection from the antibiotic.  SUBJECTIVE  Patient reports: Still thinking that the pain is still coming down from the medicine. Noticed that the pain was definitely less the afternoon after her treatment last time but it seems to be cranked up again this morning. Has a little bit of blood when she wipes, has redness throughout the area.      Precautions:  Neurological diagnosis pending, has "non-specific" brain lesions   Pain update:  Location of pain: B flank pain (pelvic area) Current pain:  5/10 (5/10, increases with motion) Max pain:  6/10 (6/10 when it spasms) Least pain:  1-2/10 (1-2) Nature of pain:  sharp to dull ache   Patient Goals: Get rid of the discomfort with catheterizing and ache/bladder pain. Be able to take fewer medications and be able to enjoy spending time with her family again, doing "all the mom things"    OBJECTIVE  Changes in: Range of Motion/Flexibilty:  (from 12/10) Spine: ~ 2-3 fingers from knee with SB B. ~ 50% reduced R rotation with stretch in L, ~ 75% reduced L rotation with increased tightness. Forward bend: 19 in.from floor. Hips: Unable to achieve neutral pelvis in long-sitting due to hamstring  tightness.   Strength/MMT:  LE MMT (from 12-10) LE MMT Left Right  Hip flex:  (L2) /5 /5  Hip ext: 3+/5 3+/5  Hip abd: 4/5 4/5  Hip add: 4/5 3+/5  Hip IR 4/5 3+/5  Hip ER 4/5 4/5   Pelvic Floor External Exam: (From prior session)- [Introitus Appears: elevated Skin integrity: WNL Palpation: TTP to B STP Cough: limited motion Prolapse visible?: no Scar mobility: not assessed  Internal Vaginal Exam: Strength (PERF): Pt. Has difficulty sensing the PFM to generate contraction or relaxation due to high tone/neuro involvement Symmetry: L>R for spasm/TTP Palpation: TTP to all muscles throughout B Prolapse: none]  Palpation:  TTP to B adductor brevis and B Iliacus  Gait Analysis:  INTERVENTIONS THIS SESSION:  Manual: Performed TP release to B adductor brevis to decrease spasm and pain and allow for improved balance of musculature for improved function and decreased symptoms.  Therex: Educated on and practiced hip ABD in side-lying and reviewed hip EXT and modified for improved efficacy/glute recruitment to use reciprocal inhibition to decrease spasm and pain in hip-flexors and adductors and vicariously to PFM.  Dry-needle: Performed TPDN with a .30x73mm  needles and standard approach as described below to decrease spasm and pain and allow for improved balance of musculature for improved function and decreased symptoms.  E-stim: Performed attended E-stim with TPDN to B adductor brevis at a strong-tingle intensity with frequency of 30 mA.     Total time: 60 min.                     Trigger Point Dry Needling - 06/25/19 0001    Consent Given?  Yes    Education Handout Provided  No    Muscles Treated Lower Quadrant  Adductor longus/brevis/magnus    Electrical Stimulation Performed with Dry Needling  Yes    Adductor Response  Twitch response elicited;Palpable increased muscle length             PT Short Term Goals - 06/10/19 1116      PT SHORT  TERM GOAL #1   Title  Patient will demonstrate coordinated diaphragmatic breathing with pelvic tilts to demonstrate improved control of diaphragm and TA, to allow for further strengthening of core musculature and decreased pelvic floor spasm.    Baseline  Pt. demonstrates breathing dysfunction and poor PFM coordination evidenced by anal manometry As of 2/22: Pt. continues to have restriction in her diaphragm and near T/L junction but is able to intentionally use diaphragmatic breathing through the available ROM.    Time  5    Period  Weeks    Status  Achieved    Target Date  04/20/19      PT SHORT TERM GOAL #2   Title  Patient will report a reduction in pain to no greater than 6/10 over the prior week to demonstrate symptom improvement.    Baseline  Pain is 10/10 at  worst, 2/10 at best As of 2/22: Pt. had achieved this goal for 1 week as of 1/21 but may have infection or other insult that caused pain to increase again.    Time  5    Period  Weeks    Status  On-going    Target Date  04/20/19      PT SHORT TERM GOAL #3   Title  Patient will demonstrate HEP x1 in the clinic to demonstrate understanding and proper form to allow for further improvement.    Baseline  Pt. lacks knowledge of therepeutic exercises that can decrease her pain/Sx.    Time  5    Period  Weeks    Status  Achieved    Target Date  04/20/19      PT SHORT TERM GOAL #4   Title  Patient will report consistent use of foot-stool (squatty-potty) for positioning with BM to decrease pain with BM and intra-abdominal pressure.    Baseline  Pt. having constipation due to PFM dysfunction    Time  5    Period  Weeks    Status  Achieved    Target Date  04/20/19        PT Long Term Goals - 06/10/19 0001      PT LONG TERM GOAL #1   Title  Pt. will be able to participate in regular ADL's with least restrictive device without pain increasing greater than 2/10    Baseline  Pt. limited in her ability to perform household duties by  increased pain and fatigue.    Time  10    Period  Weeks    Status  On-going    Target Date  08/19/19      PT LONG TERM GOAL #2   Title  Patient will score at or below 65/300  on the PFDI and 35% on the Female NIH-CPSI to demonstrate a clinically meaningful decrease in disability and distress due to pelvic floor dysfunction.    Baseline  PFDI: 110/300, Female NIH-CPSI: 29/43 (67%)    Time  10    Period  Weeks    Status  Unable to assess    Target Date  08/19/19      PT LONG TERM GOAL #3   Title  Patient will report no pain with intercourse to demonstrate improved functional ability.    Baseline  Pt. Having significant pain with intercourse    Time  10    Period  Weeks    Status  On-going    Target Date  08/19/19      PT LONG TERM GOAL #4   Title  Pt will report ability to work in her yard for greater than 30 minutes without extreme fatigue    Baseline  limited to 10-15 minutes before pt requires ending activity    Time  10    Period  Weeks    Status  On-going    Target Date  08/19/19      PT LONG TERM GOAL #5   Title  Pt. will be able to go 2-3 hours between emptying her bladder without increased pain and empty her bladder fully whether by urostomy, cath, or voluntary release.    Baseline  Pt. has a urostomy but continues to have to self-catheterize. Has pain with bladder filling and when using catheter.    Time  10    Period  Weeks    Status  On-going    Target Date  08/19/19  PT LONG TERM GOAL #6   Title  Patient will report having BM's at least every-other day with consistency between [facility] stool scale 3-5 over the prior week to demonstrate decreased constipation.    Baseline  Pt. unable to have regula BM's without medication, manometry shows PFM dysfunction    Time  10    Period  Weeks    Status  On-going    Target Date  08/19/19            Plan - 06/25/19 1322    Clinical Impression Statement  Pt. Responded well to all interventions today,  demonstrating greatly decreased spasm and TTP of B adductor brevis, decreased pain, as well as understanding and correct performance of all education and exercises provided today. They will continue to benefit from skilled physical therapy to work toward remaining goals and maximize function as well as decrease likelihood of symptom increase or recurrence.     PT Next Visit Plan  Give I's Y's and T's in prone, rotational mobilizations through T-L junction, more scar-release and cupping at suprapubic scar, MFR around B ilium. re-assess ROM/back pain. TPDN to multifidus through T-L  junction with E-stim at 40hz  and at 2 hz. continue to work on thoracolumbar rotation ROM and decreasing spasms, add SLS balance training, begin strengthening for reciprocal inhibition. Internal pelvic exam,    PT Home Exercise Plan  diaphragmatic breathing, seated posture (lumbar support and avoiding adduction, low back stretch, side-stretch, hamstring stretch, butterfly stretch, bow-and-arrow, TENS trial, squatty potty, bowel retraining, toileting tips, colonic massage, thoracic extensions and supine chest stretch with towel roll, hip EXT in prone with knee bent, side-lying hip ABD, child's pose., self internal TP release.    Consulted and Agree with Plan of Care  Patient       Patient will benefit from skilled therapeutic intervention in order to improve the following deficits and impairments:     Visit Diagnosis: Other muscle spasm  Muscle weakness (generalized)  Difficulty in walking, not elsewhere classified     Problem List Patient Active Problem List   Diagnosis Date Noted  . Seizure ([place]) 11/11/2018  . Major depressive disorder, recurrent episode, moderate ([place]) 02/07/2018  . Nephrolithiasis 04/16/2016  . Numbness 07/28/2015  . Bladder retention 06/23/2015  . Abdominal pain 06/04/2015  . Dizziness 05/18/2015  . Neck pain 05/18/2015  . Complicated migraine 27/09/2374  . Other fatigue 04/28/2015  .  Abnormal finding on MRI of brain 04/28/2015  . D (diarrhea) 03/29/2015  . H/O disease 03/29/2015  . Abnormal weight loss 03/29/2015  . Muscle weakness (generalized) 03/14/2015  . Headache, migraine 03/10/2015   Willa Rough DPT, ATC Willa Rough 06/25/2019, 1:26 PM  [place] MAIN [facility] SERVICES [address] [place], [place], [address] Phone: [phone]   Fax:  [phone]  Name: Mimie Goering MRN: [id number] Date of Birth: 1981/02/20

## 2019-06-30 ENCOUNTER — Other Ambulatory Visit: Payer: Self-pay

## 2019-06-30 ENCOUNTER — Ambulatory Visit: Payer: BC Managed Care – PPO

## 2019-06-30 DIAGNOSIS — M6281 Muscle weakness (generalized): Secondary | ICD-10-CM

## 2019-06-30 DIAGNOSIS — R262 Difficulty in walking, not elsewhere classified: Secondary | ICD-10-CM

## 2019-06-30 DIAGNOSIS — M62838 Other muscle spasm: Secondary | ICD-10-CM

## 2019-06-30 NOTE — Therapy (Signed)
[place] MAIN [facility] SERVICES [address] [place], [place], [address] Phone: [phone]   Fax:  [phone]  Physical Therapy Treatment  The patient has been informed of current processes in place at Outpatient Rehab to protect patients from Covid-19 exposure including social distancing, schedule modifications, and new cleaning procedures. After discussing their particular risk with a therapist based on the patient's personal risk factors, the patient has decided to proceed with in-person therapy.   Patient Details  Name: Christine Chavez MRN: [id number] Date of Birth: [birth year] Referring Provider (PT): [name]   Encounter Date: 06/30/2019  PT End of Session - 06/30/19 1157    Visit Number  25    Number of Visits  40    Date for PT Re-Evaluation  05/21/19    Authorization Type  BCBS    Authorization Time Period  from 03/12/2019 through 05/21/2019    Authorization - Visit Number  5    Authorization - Number of Visits  20    Progress Note Due on Visit  30    PT Start Time  0940    PT Stop Time  1040    PT Time Calculation (min)  60 min    Activity Tolerance  Patient tolerated treatment well;No increased pain    Behavior During Therapy  WFL for tasks assessed/performed       Past Medical History:  Diagnosis Date  . Complication of anesthesia    ? seizures after anesthesia   . Headache   . Migraines   . Neurogenic bladder   . Renal disorder   . Vision abnormalities     Past Surgical History:  Procedure Laterality Date  . ANTERIOR CRUCIATE LIGAMENT REPAIR  1997  . APPENDECTOMY    . COLONOSCOPY WITH PROPOFOL N/A 11/11/2018   Procedure: COLONOSCOPY WITH PROPOFOL;  Surgeon: [name], MD;  Location: [facility] ENDOSCOPY;  Service: Endoscopy;  Laterality: N/A;  . CYSTOSCOPY WITH STENT PLACEMENT Right 04/17/2016   Procedure: CYSTOSCOPY WITH STENT PLACEMENT;  Surgeon: [name], MD;  Location: [facility] ORS;   Service: Urology;  Laterality: Right;  . ESOPHAGOGASTRODUODENOSCOPY (EGD) WITH PROPOFOL N/A 11/11/2018   Procedure: ESOPHAGOGASTRODUODENOSCOPY (EGD) WITH PROPOFOL;  Surgeon: [name], MD;  Location: [facility] ENDOSCOPY;  Service: Endoscopy;  Laterality: N/A;  . EXPLORATORY LAPAROTOMY  1999  . KIDNEY STONE SURGERY  04/2016  . REVISION UROSTOMY CUTANEOUS    . REVISION UROSTOMY CUTANEOUS  01/10/2018  . SUPRAPUBIC CATHETER PLACEMENT  08/2017  . TONSILLECTOMY      There were no vitals filed for this visit.   Pelvic Floor Physical Therapy Treatment Note  SCREENING  Changes in medications, allergies, or medical history?: Has switched onto the daily antibiotic now for ~ 1 month, believes she has a yeast infection from the antibiotic.  SUBJECTIVE  Patient reports: Thinks that the baclofen is starting to help, is going to take diflucan for the yeast infection, had a hard time getting to the pharmacy. Has greatly decreased pain in the  Flanks. Occasionally having "grabbing" on the right side and is having belching and GI issues/ sluggish still. Had to use an Enema yesterday. Pain is shorter in duration and less frequent.    Precautions:  Neurological diagnosis pending, has "non-specific" brain lesions   Pain update:  Location of pain: B flank pain (pelvic area) Current pain:  2/10 (2/10, increases with motion) Max pain:  6/10 (6/10 when it spasms) Least pain:  1-2/10 (1-2) [name]  of pain: sharp to dull ache  *feels looser but no change in pain number following treatment.  Patient Goals: Get rid of the discomfort with catheterizing and ache/bladder pain. Be able to take fewer medications and be able to enjoy spending time with her family again, doing "all the mom things"    OBJECTIVE  Changes in: Range of Motion/Flexibilty:  (from 12/10) Spine: ~ 2-3 fingers from knee with SB B. ~ 50% reduced R rotation with stretch in L, ~ 75% reduced L rotation with increased tightness.  Forward bend: 19 in.from floor. Hips: Unable to achieve neutral pelvis in long-sitting due to hamstring tightness.   Strength/MMT:  LE MMT (from 12-10) LE MMT Left Right  Hip flex:  (L2) /5 /5  Hip ext: 3+/5 3+/5  Hip abd: 4/5 4/5  Hip add: 4/5 3+/5  Hip IR 4/5 3+/5  Hip ER 4/5 4/5   Pelvic Floor External Exam: (From prior session)- [Introitus Appears: elevated Skin integrity: WNL Palpation: TTP to B STP Cough: limited motion Prolapse visible?: no Scar mobility: not assessed  Internal Vaginal Exam: Strength (PERF): Pt. Has difficulty sensing the PFM to generate contraction or relaxation due to high tone/neuro involvement Symmetry: L>R for spasm/TTP Palpation: TTP to all muscles throughout B Prolapse: none]  Palpation:  TTP to B multifidus at T12-L1  Gait Analysis:  INTERVENTIONS THIS SESSION:  Manual: Performed TP release to multifidus at T12-L1 to decrease spasm and pain and allow for improved balance of musculature for improved function and decreased symptoms.  Therex:  Dry-needle: Performed TPDN with a .30x71mm  needles and standard approach as described below to decrease spasm and pain and allow for improved balance of musculature for improved function and decreased symptoms.  E-stim: Performed attended E-stim with TPDN to multifidus at T12-L1 level at a strong-tingle intensity with frequency of 30 mA.     Total time: 60 min.                    Trigger Point Dry Needling - 06/30/19 0001    Consent Given?  Yes    Education Handout Provided  No    Muscles Treated Back/Hip  Lumbar multifidi;Thoracic multifidi    Dry Needling Comments  B    Electrical Stimulation Performed with Dry Needling  Yes             PT Short Term Goals - 06/10/19 1116      PT SHORT TERM GOAL #1   Title  Patient will demonstrate coordinated diaphragmatic breathing with pelvic tilts to demonstrate improved control of diaphragm and TA, to allow for further  strengthening of core musculature and decreased pelvic floor spasm.    Baseline  Pt. demonstrates breathing dysfunction and poor PFM coordination evidenced by anal manometry As of 2/22: Pt. continues to have restriction in her diaphragm and near T/L junction but is able to intentionally use diaphragmatic breathing through the available ROM.    Time  5    Period  Weeks    Status  Achieved    Target Date  04/20/19      PT SHORT TERM GOAL #2   Title  Patient will report a reduction in pain to no greater than 6/10 over the prior week to demonstrate symptom improvement.    Baseline  Pain is 10/10 at worst, 2/10 at best As of 2/22: Pt. had achieved this goal for 1 week as of 1/21 but may have infection or other insult that caused pain to  increase again.    Time  5    Period  Weeks    Status  On-going    Target Date  04/20/19      PT SHORT TERM GOAL #3   Title  Patient will demonstrate HEP x1 in the clinic to demonstrate understanding and proper form to allow for further improvement.    Baseline  Pt. lacks knowledge of therepeutic exercises that can decrease her pain/Sx.    Time  5    Period  Weeks    Status  Achieved    Target Date  04/20/19      PT SHORT TERM GOAL #4   Title  Patient will report consistent use of foot-stool (squatty-potty) for positioning with BM to decrease pain with BM and intra-abdominal pressure.    Baseline  Pt. having constipation due to PFM dysfunction    Time  5    Period  Weeks    Status  Achieved    Target Date  04/20/19        PT Long Term Goals - 06/10/19 0001      PT LONG TERM GOAL #1   Title  Pt. will be able to participate in regular ADL's with least restrictive device without pain increasing greater than 2/10    Baseline  Pt. limited in her ability to perform household duties by increased pain and fatigue.    Time  10    Period  Weeks    Status  On-going    Target Date  08/19/19      PT LONG TERM GOAL #2   Title  Patient will score at or below  65/300  on the PFDI and 35% on the Female NIH-CPSI to demonstrate a clinically meaningful decrease in disability and distress due to pelvic floor dysfunction.    Baseline  PFDI: 110/300, Female NIH-CPSI: 29/43 (67%)    Time  10    Period  Weeks    Status  Unable to assess    Target Date  08/19/19      PT LONG TERM GOAL #3   Title  Patient will report no pain with intercourse to demonstrate improved functional ability.    Baseline  Pt. Having significant pain with intercourse    Time  10    Period  Weeks    Status  On-going    Target Date  08/19/19      PT LONG TERM GOAL #4   Title  Pt will report ability to work in her yard for greater than 30 minutes without extreme fatigue    Baseline  limited to 10-15 minutes before pt requires ending activity    Time  10    Period  Weeks    Status  On-going    Target Date  08/19/19      PT LONG TERM GOAL #5   Title  Pt. will be able to go 2-3 hours between emptying her bladder without increased pain and empty her bladder fully whether by urostomy, cath, or voluntary release.    Baseline  Pt. has a urostomy but continues to have to self-catheterize. Has pain with bladder filling and when using catheter.    Time  10    Period  Weeks    Status  On-going    Target Date  08/19/19      PT LONG TERM GOAL #6   Title  Patient will report having BM's at least every-other day with consistency between [facility] stool scale 3-5  over the prior week to demonstrate decreased constipation.    Baseline  Pt. unable to have regula BM's without medication, manometry shows PFM dysfunction    Time  10    Period  Weeks    Status  On-going    Target Date  08/19/19            Plan - 06/30/19 1158    Clinical Impression Statement  Pt. Responded well to all interventions today, demonstrating decreased spasm and improved rotational ROM, decreased TTP, as well as understanding and correct performance of all education and exercises provided today. They will  continue to benefit from skilled physical therapy to work toward remaining goals and maximize function as well as decrease likelihood of symptom increase or recurrence.     PT Next Visit Plan  Give I's Y's and T's in prone, rotational mobilizations through T-L junction, more scar-release and cupping at suprapubic scar, MFR around B ilium. re-assess ROM/back pain. TPDN to multifidus through T-L  junction with E-stim at 40hz  and at 2 hz. continue to work on thoracolumbar rotation ROM and decreasing spasms, add SLS balance training, begin strengthening for reciprocal inhibition. Internal pelvic exam,    PT Home Exercise Plan  diaphragmatic breathing, seated posture (lumbar support and avoiding adduction, low back stretch, side-stretch, hamstring stretch, butterfly stretch, bow-and-arrow, TENS trial, squatty potty, bowel retraining, toileting tips, colonic massage, thoracic extensions and supine chest stretch with towel roll, hip EXT in prone with knee bent, side-lying hip ABD, child's pose., self internal TP release.    Consulted and Agree with Plan of Care  Patient       Patient will benefit from skilled therapeutic intervention in order to improve the following deficits and impairments:     Visit Diagnosis: Other muscle spasm  Muscle weakness (generalized)  Difficulty in walking, not elsewhere classified     Problem List Patient Active Problem List   Diagnosis Date Noted  . Seizure ([facility]) 11/11/2018  . Major depressive disorder, recurrent episode, moderate ([facility]) 02/07/2018  . Nephrolithiasis 04/16/2016  . Numbness 07/28/2015  . Bladder retention 06/23/2015  . Abdominal pain 06/04/2015  . Dizziness 05/18/2015  . Neck pain 05/18/2015  . Complicated migraine 04/28/2015  . Other fatigue 04/28/2015  . Abnormal finding on MRI of brain 04/28/2015  . D (diarrhea) 03/29/2015  . H/O disease 03/29/2015  . Abnormal weight loss 03/29/2015  . Muscle weakness (generalized) 03/14/2015  .  Headache, migraine 03/10/2015   03/12/2015 DPT, ATC Cleophus Molt 06/30/2019, 11:59 AM  [place] [facility] MAIN [facility] SERVICES [address] [place], [place], [place] Phone: [phone]   Fax:  [phone]  Name: Virdie Penning MRN: [name] Date of Birth: 06-04-1980

## 2019-07-02 ENCOUNTER — Ambulatory Visit: Payer: BC Managed Care – PPO

## 2019-07-04 ENCOUNTER — Ambulatory Visit (INDEPENDENT_AMBULATORY_CARE_PROVIDER_SITE_OTHER): Payer: BC Managed Care – PPO | Admitting: Psychiatry

## 2019-07-04 ENCOUNTER — Encounter: Payer: Self-pay | Admitting: Psychiatry

## 2019-07-04 DIAGNOSIS — F5101 Primary insomnia: Secondary | ICD-10-CM

## 2019-07-04 DIAGNOSIS — F331 Major depressive disorder, recurrent, moderate: Secondary | ICD-10-CM | POA: Diagnosis not present

## 2019-07-04 DIAGNOSIS — F419 Anxiety disorder, unspecified: Secondary | ICD-10-CM | POA: Diagnosis not present

## 2019-07-04 DIAGNOSIS — F332 Major depressive disorder, recurrent severe without psychotic features: Secondary | ICD-10-CM | POA: Diagnosis not present

## 2019-07-04 MED ORDER — BUPROPION HCL ER (SR) 100 MG PO TB12: ORAL_TABLET | ORAL | 0 refills | Status: DC

## 2019-07-04 MED ORDER — MIRTAZAPINE 15 MG PO TABS: 15.0000 mg | ORAL_TABLET | Freq: Every day | ORAL | 1 refills | Status: DC

## 2019-07-04 MED ORDER — VORTIOXETINE HBR 20 MG PO TABS: 20.0000 mg | ORAL_TABLET | Freq: Every day | ORAL | 0 refills | Status: DC

## 2019-07-04 NOTE — Progress Notes (Signed)
Christine Chavez [id number] 1980/06/16 39 y.o.  Virtual Visit via Video Note  I connected with pt @ on 07/04/19 at  9:00 AM EDT by a video enabled telemedicine application and verified that I am speaking with the correct person using two identifiers.   I discussed the limitations of evaluation and management by telemedicine and the availability of in person appointments. The patient expressed understanding and agreed to proceed.  I discussed the assessment and treatment plan with the patient. The patient was provided an opportunity to ask questions and all were answered. The patient agreed with the plan and demonstrated an understanding of the instructions.   The patient was advised to call back or seek an in-person evaluation if the symptoms worsen or if the condition fails to improve as anticipated.  I provided 30 minutes of non-face-to-face time during this encounter.  The patient was located at home.  The provider was located at [facility].   Corie Chiquito, PMHNP   Subjective:   Patient ID:  Christine Chavez is a 39 y.o. (DOB October 21, 1980) female.  Chief Complaint:  Chief Complaint  Patient presents with  . Follow-up    Anxiety, Depression    HPI [address] [place] presents for follow-up of anxiety and depression. Mood has been down some and "been a little more ok with taking the meds." Reports that she has had some anxiety and "I have been trying to work through it and adjust to the new me." Reports that she is thinking about things that she used to enjoy and thinking about how to make some of these activities possible now. She expresses some hope with learning coping strategies and optimism about seeing a pain specialist. She reports that she is working on accepting physical limitations. Has been trying to focus on self-care and allowing herself to take breaks. Now taking a 20-30 minute break before her children come home. She reports that she has been falling asleep  quickly. She reports that she has has some difficulty sleeping through the night and has had some night sweats again. Appetite has been low on days when she had had worsening GI s/s and has been ok other times. She reports that she is working on improving motivation and has idea for a side business to try to contribute financially to the household. Made a covid care basket for a friend and has thought about making baskets for others and selling them. Concentration is ok. Denies SI.   Husband had a kidney stone this week and had significant pain until he passed it this morning. She reports that she has been worried about her husband.   Past medication Trials: Remeron- Reports sleeping better at 15 mg but feels more lethargic upon awakening compared to 7.5 mg.  Sertraline- Has been on 200 mg for the last month and has not noticed a significant improvement. Cymbalta- Reports increased drowsiness and impaired concentration at 90 mg. Trintellix Wellbutrin Rexulti-Increased heart rate and SOB Abilify Gabapentin- Causes drowsiness. Anxiety may have increased when she stopped taking TID. Reports that is did not seem to be effective for pain Lyrica Did not start Lithium since PCP advised against it   Review of Systems:  Review of Systems  Constitutional: Positive for fatigue.  Gastrointestinal: Positive for abdominal distention, abdominal pain, constipation, diarrhea and nausea.  Genitourinary:       Repeated UTI. Bladder spasms  Musculoskeletal: Positive for gait problem.       Muscle spasms  Neurological: Positive for weakness.  Psychiatric/Behavioral:       Please refer to HPI    Medications: I have reviewed the patient's current medications.  Current Outpatient Medications  Medication Sig Dispense Refill  . B Complex Vitamins (VITAMIN B COMPLEX 100) INJ Inject as directed.    . baclofen (LIORESAL) 10 MG tablet Take 10 mg by mouth 3 (three) times daily.    . belladonna-opium (B&O  SUPPRETTES) 16.2-30 MG suppository Place 30 mg rectally every 8 (eight) hours as needed for pain.    . butalbital-acetaminophen-caffeine (FIORICET, ESGIC) 50-325-40 MG tablet Take by mouth.    . Cyanocobalamin (VITAMIN B-12) 2500 MCG SUBL Take 2,500 mcg by mouth daily.    . ergocalciferol (VITAMIN D2) 1.25 MG (50000 UT) capsule Take by mouth.    . folic acid (FOLVITE) 1 MG tablet Take 1 mg by mouth daily.    Marland Kitchen LINZESS 72 MCG capsule TAKE 1 CAPSULE (72 MCG TOTAL) BY MOUTH ONCE DAILY    . mirabegron ER (MYRBETRIQ) 50 MG TB24 tablet Take 50 mg by mouth daily.     . ondansetron (ZOFRAN) 4 MG tablet Take 4 mg by mouth every 8 (eight) hours as needed for nausea or vomiting.    Marland Kitchen oxybutynin (DITROPAN) 5 MG tablet Take 5 mg by mouth 3 (three) times daily.    Marland Kitchen oxybutynin (DITROPAN-XL) 5 MG 24 hr tablet Take 5 mg by mouth at bedtime.    . OXYCODONE HCL PO Take 5 mg by mouth every 6 (six) hours as needed.    . pantoprazole (PROTONIX) 40 MG tablet Take 40 mg by mouth 2 (two) times daily.     . pregabalin (LYRICA) 75 MG capsule Takes 2 caps po q am and 2 caps po QHS    . promethazine (PHENERGAN) 12.5 MG tablet Take 1 tablet (12.5 mg total) by mouth every 4 (four) hours as needed for nausea or vomiting. 20 tablet 0  . rizatriptan (MAXALT) 5 MG tablet Take 5 mg by mouth as needed.     . trimethoprim (TRIMPEX) 100 MG tablet Take by mouth.    . Trospium Chloride 60 MG CP24 Take by mouth.    Marland Kitchen buPROPion (WELLBUTRIN SR) 100 MG 12 hr tablet TAKE 1 TABLET BY MOUTH EVERY DAY IN THE MORNING 90 tablet 0  . mirtazapine (REMERON) 15 MG tablet Take 1 tablet (15 mg total) by mouth at bedtime. 90 tablet 1  . vortioxetine HBr (TRINTELLIX) 20 MG TABS tablet Take 1 tablet (20 mg total) by mouth daily. 90 tablet 0   No current facility-administered medications for this visit.    Medication Side Effects: None  Allergies:  Allergies  Allergen Reactions  . Aspirin Shortness Of Breath, Swelling and Anaphylaxis  .  Ibuprofen Shortness Of Breath, Swelling and Anaphylaxis  . Morphine And Related Hives    Past Medical History:  Diagnosis Date  . Complication of anesthesia    ? seizures after anesthesia   . Headache   . Migraines   . Neurogenic bladder   . Renal disorder   . Vision abnormalities     Family History  Problem Relation Age of Onset  . Hypertension Mother   . Atrial fibrillation Father   . Healthy Brother   . Depression Brother   . Arthritis/Rheumatoid Paternal Grandmother   . Healthy Brother     Social History   Socioeconomic History  . Marital status: Married    Spouse name: Not on file  . Number of children: Not on file  .  Years of education: Not on file  . Highest education level: Not on file  Occupational History  . Not on file  Tobacco Use  . Smoking status: Never Smoker  . Smokeless tobacco: Never Used  Substance and Sexual Activity  . Alcohol use: No    Alcohol/week: 0.0 standard drinks  . Drug use: No  . Sexual activity: Yes    Birth control/protection: None    Comment: vasectomy  Other Topics Concern  . Not on file  Social History Narrative  . Not on file   Social Determinants of Health   Financial Resource Strain:   . Difficulty of Paying Living Expenses:   Food Insecurity:   . Worried About Programme researcher, broadcasting/film/video in the Last Year:   . Barista in the Last Year:   Transportation Needs:   . Freight forwarder (Medical):   Marland Kitchen Lack of Transportation (Non-Medical):   Physical Activity:   . Days of Exercise per Week:   . Minutes of Exercise per Session:   Stress:   . Feeling of Stress :   Social Connections:   . Frequency of Communication with Friends and Family:   . Frequency of Social Gatherings with Friends and Family:   . Attends Religious Services:   . Active Member of Clubs or Organizations:   . Attends Banker Meetings:   Marland Kitchen Marital Status:   Intimate Partner Violence:   . Fear of Current or Ex-Partner:   .  Emotionally Abused:   Marland Kitchen Physically Abused:   . Sexually Abused:     Past Medical History, Surgical history, Social history, and Family history were reviewed and updated as appropriate.   Please see review of systems for further details on the patient's review from today.   Objective:   Physical Exam:  Wt 140 lb (63.5 kg)   BMI 21.93 kg/m   Physical Exam Neurological:     Mental Status: She is alert and oriented to person, place, and time.     Cranial Nerves: No dysarthria.  Psychiatric:        Attention and Perception: Attention and perception normal.        Speech: Speech normal.        Behavior: Behavior is cooperative.        Thought Content: Thought content normal. Thought content is not paranoid or delusional. Thought content does not include homicidal or suicidal ideation. Thought content does not include homicidal or suicidal plan.        Cognition and Memory: Cognition and memory normal.        Judgment: Judgment normal.     Comments: Insight intact Mood presents as less depressed and less anxious compared to previous exams. More forward thinking      Lab Review:     Component Value Date/Time   NA 139 11/12/2018 0520   K 3.4 (L) 11/12/2018 0520   CL 110 11/12/2018 0520   CO2 24 11/12/2018 0520   GLUCOSE 91 11/12/2018 0520   BUN <5 (L) 11/12/2018 0520   CREATININE 0.65 11/12/2018 0520   CALCIUM 8.0 (L) 11/12/2018 0520   PROT 7.2 03/03/2017 2132   ALBUMIN 4.3 03/03/2017 2132   AST 17 03/03/2017 2132   ALT 15 03/03/2017 2132   ALKPHOS 51 03/03/2017 2132   BILITOT 1.7 (H) 03/03/2017 2132   GFRNONAA >60 11/12/2018 0520   GFRAA >60 11/12/2018 0520       Component Value Date/Time   WBC 7.2  11/12/2018 0520   RBC 3.59 (L) 11/12/2018 0520   HGB 11.2 (L) 11/12/2018 0520   HCT 33.8 (L) 11/12/2018 0520   HCT 31.7 (L) 04/28/2012 0618   PLT 252 11/12/2018 0520   MCV 94.2 11/12/2018 0520   MCH 31.2 11/12/2018 0520   MCHC 33.1 11/12/2018 0520   RDW 13.4  11/12/2018 0520   LYMPHSABS 3.3 03/03/2017 2132   MONOABS 0.6 03/03/2017 2132   EOSABS 0.1 03/03/2017 2132   BASOSABS 0.1 03/03/2017 2132    No results found for: POCLITH, LITHIUM   No results found for: PHENYTOIN, PHENOBARB, VALPROATE, CBMZ   .res Assessment: Plan:   Discussed continuing current plan of care since she has had some recent improvement in mood and anxiety s/s and is tolerating current medications without any significant tolerability issues. Discussed possibly considering dose reductions in the future of Trintellix. Recommend continuing therapy. Pt to f/u with this provider in 6 months or sooner if clinically indicated.  Patient advised to contact office with any questions, adverse effects, or acute worsening in signs and symptoms.  Denis was seen today for follow-up.  Diagnoses and all orders for this visit:  Severe episode of recurrent major depressive disorder, without psychotic features (HCC) -     mirtazapine (REMERON) 15 MG tablet; Take 1 tablet (15 mg total) by mouth at bedtime. -     buPROPion (WELLBUTRIN SR) 100 MG 12 hr tablet; TAKE 1 TABLET BY MOUTH EVERY DAY IN THE MORNING  Primary insomnia -     mirtazapine (REMERON) 15 MG tablet; Take 1 tablet (15 mg total) by mouth at bedtime.  Anxiety disorder, unspecified type -     vortioxetine HBr (TRINTELLIX) 20 MG TABS tablet; Take 1 tablet (20 mg total) by mouth daily.  Moderate episode of recurrent major depressive disorder (HCC) -     vortioxetine HBr (TRINTELLIX) 20 MG TABS tablet; Take 1 tablet (20 mg total) by mouth daily.     Please see After Visit Summary for patient specific instructions.  Future Appointments  Date Time Provider Department Center  07/07/2019  9:30 AM Flora Lipps T, PT [facility] None  07/09/2019 10:00 AM Flora Lipps T, PT [facility] None  07/14/2019  9:30 AM Flora Lipps T, PT [facility] None  07/16/2019 10:00 AM Flora Lipps T, PT [facility] None  07/22/2019 10:30 AM Flora Lipps T, PT [facility] None  07/24/2019  9:00 AM Flora Lipps T, PT [facility] None  07/31/2019  9:00 AM Flora Lipps T, PT [facility] None  08/05/2019 10:30 AM Flora Lipps T, PT [facility] None  08/07/2019  9:00 AM Flora Lipps T, PT [facility] None  08/12/2019 10:30 AM Flora Lipps T, PT [facility] None  08/14/2019 10:00 AM Flora Lipps T, PT [facility] None    No orders of the defined types were placed in this encounter.     -------------------------------

## 2019-07-07 ENCOUNTER — Ambulatory Visit: Payer: BC Managed Care – PPO

## 2019-07-07 ENCOUNTER — Other Ambulatory Visit: Payer: Self-pay

## 2019-07-07 DIAGNOSIS — M6281 Muscle weakness (generalized): Secondary | ICD-10-CM

## 2019-07-07 DIAGNOSIS — M62838 Other muscle spasm: Secondary | ICD-10-CM | POA: Diagnosis not present

## 2019-07-07 DIAGNOSIS — R262 Difficulty in walking, not elsewhere classified: Secondary | ICD-10-CM

## 2019-07-07 NOTE — Therapy (Signed)
[facility] MAIN [facility] SERVICES [address] [place], [place], [address] Phone: [phone]   Fax:  [phone]  Physical Therapy Treatment  The patient has been informed of current processes in place at Outpatient Rehab to protect patients from Covid-19 exposure including social distancing, schedule modifications, and new cleaning procedures. After discussing their particular risk with a therapist based on the patient's personal risk factors, the patient has decided to proceed with in-person therapy.   Patient Details  Name: Christine Chavez MRN: [id number] Date of Birth: Jul 06, 1980 Referring Provider (PT): [name]   Encounter Date: 07/07/2019  PT End of Session - 07/07/19 1156    Visit Number  26    Number of Visits  40    Date for PT Re-Evaluation  05/21/19    Authorization Type  [employer]    Authorization Time Period  from 03/12/2019 through 05/21/2019    Authorization - Visit Number  6    Authorization - Number of Visits  20    Progress Note Due on Visit  30    PT Start Time  0930    PT Stop Time  1030    PT Time Calculation (min)  60 min    Activity Tolerance  Patient tolerated treatment well;No increased pain    Behavior During Therapy  WFL for tasks assessed/performed       Past Medical History:  Diagnosis Date  . Complication of anesthesia    ? seizures after anesthesia   . Headache   . Migraines   . Neurogenic bladder   . Renal disorder   . Vision abnormalities     Past Surgical History:  Procedure Laterality Date  . ANTERIOR CRUCIATE LIGAMENT REPAIR  1997  . APPENDECTOMY    . COLONOSCOPY WITH PROPOFOL N/A 11/11/2018   Procedure: COLONOSCOPY WITH PROPOFOL;  Surgeon: Christena Deem, MD;  Location: [facility] ENDOSCOPY;  Service: Endoscopy;  Laterality: N/A;  . CYSTOSCOPY WITH STENT PLACEMENT Right 04/17/2016   Procedure: CYSTOSCOPY WITH STENT PLACEMENT;  Surgeon: Malen Gauze, MD;  Location: [facility] ORS;   Service: Urology;  Laterality: Right;  . ESOPHAGOGASTRODUODENOSCOPY (EGD) WITH PROPOFOL N/A 11/11/2018   Procedure: ESOPHAGOGASTRODUODENOSCOPY (EGD) WITH PROPOFOL;  Surgeon: Christena Deem, MD;  Location: [facility] ENDOSCOPY;  Service: Endoscopy;  Laterality: N/A;  . EXPLORATORY LAPAROTOMY  1999  . KIDNEY STONE SURGERY  04/2016  . REVISION UROSTOMY CUTANEOUS    . REVISION UROSTOMY CUTANEOUS  01/10/2018  . SUPRAPUBIC CATHETER PLACEMENT  08/2017  . TONSILLECTOMY      There were no vitals filed for this visit.   Pelvic Floor Physical Therapy Treatment Note  SCREENING  Changes in medications, allergies, or medical history?:    SUBJECTIVE  Patient reports: Has had a rocky couple days, [name] was bad but she just started her period yesterday. Has had indigestion and diarrhea off and on. She has had a "not good smell" to her urine so is unsure if the meds are masking that she has a UTI again. She felt hot and cold but did not have a temperature. Today she is just a little nauseated. She was making baskets and felt like her back was irritated from that. It is depressing because she does not want to keep having infections and does not want to keep taking antibiotics either. Is already eating a lot of fermented. Has been able to use the restroom without having to use anything starting the day after treatment last week.  Precautions:  Neurological diagnosis pending, has "non-specific" brain lesions   Pain update:  Location of pain: B flank pain (pelvic area) Current pain:  4/10 (4/10, increases with motion) Max pain:  7/10 (7/10 when it spasms) Least pain:  2/10 (2/10) Nature of pain: sharp to dull ache  *2/10 following treatment, with post-treatments achyness as well  Patient Goals: Get rid of the discomfort with catheterizing and ache/bladder pain. Be able to take fewer medications and be able to enjoy spending time with her family again, doing "all the mom things"     OBJECTIVE  Changes in: Range of Motion/Flexibilty:  (from 12/10) Spine: ~ 2-3 fingers from knee with SB B. ~ 50% reduced R rotation with stretch in L, ~ 75% reduced L rotation with increased tightness. Forward bend: 19 in.from floor. Hips: Unable to achieve neutral pelvis in long-sitting due to hamstring tightness.   Strength/MMT:  LE MMT (from 12-10) LE MMT Left Right  Hip flex:  (L2) /5 /5  Hip ext: 3+/5 3+/5  Hip abd: 4/5 4/5  Hip add: 4/5 3+/5  Hip IR 4/5 3+/5  Hip ER 4/5 4/5   Pelvic Floor External Exam: (From prior session)- [Introitus Appears: elevated Skin integrity: WNL Palpation: TTP to B STP Cough: limited motion Prolapse visible?: no Scar mobility: not assessed  Internal Vaginal Exam: Strength (PERF): Pt. Has difficulty sensing the PFM to generate contraction or relaxation due to high tone/neuro involvement Symmetry: L>R for spasm/TTP Palpation: TTP to all muscles throughout B Prolapse: none]  Palpation:  TTP to B Psoas and Iliacus with greatest at proximal Psoas.   Gait Analysis:  INTERVENTIONS THIS SESSION:  Manual: Performed TP release and STM to iliopsoas distally to decrease spasm and pain and allow for improved balance of musculature for improved function and decreased symptoms.  Dry-needle: Performed TPDN with a .30x41mm  needles and standard approach as described below to decrease spasm and pain and allow for improved balance of musculature for improved function and decreased symptoms.  Self-care: Discussed use of Kefir as a low-cost probiotic option to help protect her gut biome against the frequent barrage of antibiotics and discussed calling her urologist soon if she is having persistent temperature swings like she had yesterday, giving it a day to see if it was hormone related due to menstrual cycle.      Total time: 60 min.                   Trigger Point Dry Needling - 07/07/19 0001    Consent Given?  Yes     Education Handout Provided  No    Muscles Treated Back/Hip  Iliopsoas    Iliopsoas Response  Twitch response elicited;Palpable increased muscle length             PT Short Term Goals - 06/10/19 1116      PT SHORT TERM GOAL #1   Title  Patient will demonstrate coordinated diaphragmatic breathing with pelvic tilts to demonstrate improved control of diaphragm and TA, to allow for further strengthening of core musculature and decreased pelvic floor spasm.    Baseline  Pt. demonstrates breathing dysfunction and poor PFM coordination evidenced by anal manometry As of 2/22: Pt. continues to have restriction in her diaphragm and near T/L junction but is able to intentionally use diaphragmatic breathing through the available ROM.    Time  5    Period  Weeks    Status  Achieved    Target Date  04/20/19  PT SHORT TERM GOAL #2   Title  Patient will report a reduction in pain to no greater than 6/10 over the prior week to demonstrate symptom improvement.    Baseline  Pain is 10/10 at worst, 2/10 at best As of 2/22: Pt. had achieved this goal for 1 week as of 1/21 but may have infection or other insult that caused pain to increase again.    Time  5    Period  Weeks    Status  On-going    Target Date  04/20/19      PT SHORT TERM GOAL #3   Title  Patient will demonstrate HEP x1 in the clinic to demonstrate understanding and proper form to allow for further improvement.    Baseline  Pt. lacks knowledge of therepeutic exercises that can decrease her pain/Sx.    Time  5    Period  Weeks    Status  Achieved    Target Date  04/20/19      PT SHORT TERM GOAL #4   Title  Patient will report consistent use of foot-stool (squatty-potty) for positioning with BM to decrease pain with BM and intra-abdominal pressure.    Baseline  Pt. having constipation due to PFM dysfunction    Time  5    Period  Weeks    Status  Achieved    Target Date  04/20/19        PT Long Term Goals - 06/10/19 0001       PT LONG TERM GOAL #1   Title  Pt. will be able to participate in regular ADL's with least restrictive device without pain increasing greater than 2/10    Baseline  Pt. limited in her ability to perform household duties by increased pain and fatigue.    Time  10    Period  Weeks    Status  On-going    Target Date  08/19/19      PT LONG TERM GOAL #2   Title  Patient will score at or below 65/300  on the PFDI and 35% on the Female NIH-CPSI to demonstrate a clinically meaningful decrease in disability and distress due to pelvic floor dysfunction.    Baseline  PFDI: 110/300, Female NIH-CPSI: 29/43 (67%)    Time  10    Period  Weeks    Status  Unable to assess    Target Date  08/19/19      PT LONG TERM GOAL #3   Title  Patient will report no pain with intercourse to demonstrate improved functional ability.    Baseline  Pt. Having significant pain with intercourse    Time  10    Period  Weeks    Status  On-going    Target Date  08/19/19      PT LONG TERM GOAL #4   Title  Pt will report ability to work in her yard for greater than 30 minutes without extreme fatigue    Baseline  limited to 10-15 minutes before pt requires ending activity    Time  10    Period  Weeks    Status  On-going    Target Date  08/19/19      PT LONG TERM GOAL #5   Title  Pt. will be able to go 2-3 hours between emptying her bladder without increased pain and empty her bladder fully whether by urostomy, cath, or voluntary release.    Baseline  Pt. has a urostomy but continues to  have to self-catheterize. Has pain with bladder filling and when using catheter.    Time  10    Period  Weeks    Status  On-going    Target Date  08/19/19      PT LONG TERM GOAL #6   Title  Patient will report having BM's at least every-other day with consistency between [facility] stool scale 3-5 over the prior week to demonstrate decreased constipation.    Baseline  Pt. unable to have regula BM's without medication, manometry shows  PFM dysfunction    Time  10    Period  Weeks    Status  On-going    Target Date  08/19/19            Plan - 07/07/19 1157    Clinical Impression Statement  *Pt. Responded well to all interventions today, demonstrating improved pain from 4/10 to 2/10, decreased TTP, as well as understanding and correct performance of all education and exercises provided today. They will continue to benefit from skilled physical therapy to work toward remaining goals and maximize function as well as decrease likelihood of symptom increase or recurrence.     PT Next Visit Plan  Give I's Y's and T's in prone, rotational mobilizations through T-L junction, more scar-release and cupping at suprapubic scar, MFR around B ilium. re-assess ROM/back pain. TPDN to multifidus through T-L  junction with E-stim at 40hz  and at 2 hz. continue to work on thoracolumbar rotation ROM and decreasing spasms, add SLS balance training, begin strengthening for reciprocal inhibition. Internal pelvic exam,    PT Home Exercise Plan  diaphragmatic breathing, seated posture (lumbar support and avoiding adduction, low back stretch, side-stretch, hamstring stretch, butterfly stretch, bow-and-arrow, TENS trial, squatty potty, bowel retraining, toileting tips, colonic massage, thoracic extensions and supine chest stretch with towel roll, hip EXT in prone with knee bent, side-lying hip ABD, child's pose., self internal TP release.    Consulted and Agree with Plan of Care  Patient       Patient will benefit from skilled therapeutic intervention in order to improve the following deficits and impairments:     Visit Diagnosis: Other muscle spasm  Muscle weakness (generalized)  Difficulty in walking, not elsewhere classified     Problem List Patient Active Problem List   Diagnosis Date Noted  . Seizure ([place]) 11/11/2018  . Major depressive disorder, recurrent episode, moderate ([place]) 02/07/2018  . Nephrolithiasis 04/16/2016  . Numbness  07/28/2015  . Bladder retention 06/23/2015  . Abdominal pain 06/04/2015  . Dizziness 05/18/2015  . Neck pain 05/18/2015  . Complicated migraine [id number]  . Other fatigue 04/28/2015  . Abnormal finding on MRI of brain 04/28/2015  . D (diarrhea) 03/29/2015  . H/O disease 03/29/2015  . Abnormal weight loss 03/29/2015  . Muscle weakness (generalized) 03/14/2015  . Headache, migraine 03/10/2015   Willa Rough DPT, ATC Willa Rough 07/07/2019, 11:58 AM  [place] MAIN [facility] SERVICES [address] [place], [place], [address] Phone: [phone]   Fax:  [phone]  Name: Christine Chavez MRN: [id number] Date of Birth: [birth year]

## 2019-07-09 ENCOUNTER — Ambulatory Visit: Payer: BC Managed Care – PPO

## 2019-07-09 ENCOUNTER — Other Ambulatory Visit: Payer: Self-pay

## 2019-07-09 DIAGNOSIS — M6281 Muscle weakness (generalized): Secondary | ICD-10-CM

## 2019-07-09 DIAGNOSIS — M62838 Other muscle spasm: Secondary | ICD-10-CM

## 2019-07-09 DIAGNOSIS — R262 Difficulty in walking, not elsewhere classified: Secondary | ICD-10-CM

## 2019-07-09 NOTE — Patient Instructions (Signed)
  Do 2x5, 1-2 times per day.

## 2019-07-09 NOTE — Therapy (Signed)
[facility] MAIN [facility] SERVICES [address] [place], [place], [address] Phone: [phone]   Fax:  [phone]  Physical Therapy Treatment  The patient has been informed of current processes in place at Outpatient Rehab to protect patients from Covid-19 exposure including social distancing, schedule modifications, and new cleaning procedures. After discussing their particular risk with a therapist based on the patient's personal risk factors, the patient has decided to proceed with in-person therapy.   Patient Details  Name: Christine Chavez MRN: [id number] Date of Birth: [birth year] Referring Provider (PT): [name]   Encounter Date: 07/09/2019  PT End of Session - 07/09/19 1327    Visit Number  27    Number of Visits  40    Date for PT Re-Evaluation  05/21/19    Authorization Type  [employer]    Authorization Time Period  from 03/12/2019 through 05/21/2019    Authorization - Visit Number  7    Authorization - Number of Visits  20    Progress Note Due on Visit  30    PT Start Time  1000    PT Stop Time  1100    PT Time Calculation (min)  60 min    Activity Tolerance  Patient tolerated treatment well;No increased pain    Behavior During Therapy  WFL for tasks assessed/performed       Past Medical History:  Diagnosis Date  . Complication of anesthesia    ? seizures after anesthesia   . Headache   . Migraines   . Neurogenic bladder   . Renal disorder   . Vision abnormalities     Past Surgical History:  Procedure Laterality Date  . ANTERIOR CRUCIATE LIGAMENT REPAIR  1997  . APPENDECTOMY    . COLONOSCOPY WITH PROPOFOL N/A 11/11/2018   Procedure: COLONOSCOPY WITH PROPOFOL;  Surgeon: Christena Deem, MD;  Location: [facility] ENDOSCOPY;  Service: Endoscopy;  Laterality: N/A;  . CYSTOSCOPY WITH STENT PLACEMENT Right 04/17/2016   Procedure: CYSTOSCOPY WITH STENT PLACEMENT;  Surgeon: Malen Gauze, MD;  Location: [facility] ORS;   Service: Urology;  Laterality: Right;  . ESOPHAGOGASTRODUODENOSCOPY (EGD) WITH PROPOFOL N/A 11/11/2018   Procedure: ESOPHAGOGASTRODUODENOSCOPY (EGD) WITH PROPOFOL;  Surgeon: Christena Deem, MD;  Location: [facility] ENDOSCOPY;  Service: Endoscopy;  Laterality: N/A;  . EXPLORATORY LAPAROTOMY  1999  . KIDNEY STONE SURGERY  04/2016  . REVISION UROSTOMY CUTANEOUS    . REVISION UROSTOMY CUTANEOUS  01/10/2018  . SUPRAPUBIC CATHETER PLACEMENT  08/2017  . TONSILLECTOMY      There were no vitals filed for this visit.    Pelvic Floor Physical Therapy Treatment Note  SCREENING  Changes in medications, allergies, or medical history?: none   SUBJECTIVE  Patient reports: Christine Chavez was not a great day, has not had a BM since Sunday. Still taking the linzess. Was having a lot of back pain yesterday. May have over-done it with housework and other stuff. Bladder spasms have been less frequent and less intense. Still very fatigued. Felt decent on Monday.   Precautions:  Neurological diagnosis pending, has "non-specific" brain lesions   Pain update:  Location of pain: B flank pain (pelvic area) Current pain:  4/10 (4/10, increases with motion) Max pain:  8/10 (6/10 when it spasms) Least pain:  2/10 (2/10) Nature of pain: sharp to dull ache   Patient Goals: Get rid of the discomfort with catheterizing and ache/bladder pain. Be able to take fewer medications and be able to enjoy  spending time with her family again, doing "all the mom things"    OBJECTIVE  Changes in: Range of Motion/Flexibilty:  (from 12/10) Spine: ~ 2-3 fingers from knee with SB B. ~ 50% reduced R rotation with stretch in L, ~ 75% reduced L rotation with increased tightness. Forward bend: 19 in.from floor. Hips: Unable to achieve neutral pelvis in long-sitting due to hamstring tightness.  Today: Pt demonstrated curve of spine to the L at T/L junction with decreased stability and pain on pressure.  Strength/MMT:  LE MMT  (from 12-10) LE MMT Left Right  Hip flex:  (L2) /5 /5  Hip ext: 3+/5 3+/5  Hip abd: 4/5 4/5  Hip add: 4/5 3+/5  Hip IR 4/5 3+/5  Hip ER 4/5 4/5    TODAY: Pt. Demonstrates 3/5 strength with "I's, Y's, and T's" but 4/5 with W's.  Pelvic Floor External Exam: (From prior session)- [Introitus Appears: elevated Skin integrity: WNL Palpation: TTP to B STP Cough: limited motion Prolapse visible?: no Scar mobility: not assessed  Internal Vaginal Exam: Strength (PERF): Pt. Has difficulty sensing the PFM to generate contraction or relaxation due to high tone/neuro involvement Symmetry: L>R for spasm/TTP Palpation: TTP to all muscles throughout B Prolapse: none]  Palpation:  TTP to B Multifidus and erector spinae at T-L junction  Gait Analysis:  INTERVENTIONS THIS SESSION:  Manual: Performed TP release and STM to B Multifidus and erector spinae at T-L junction followed by grade 3-4 L to R mobs at T-L junction  to decrease spasm and pain and allow for improved balance of musculature for improved function and decreased symptoms and to improve mobility of joint and surrounding connective tissue and decrease pressure on nerve roots for improved conductivity and function of down-stream tissues.   Dry-needle: Performed TPDN with a .30x45mm  needles and standard approach as described below to decrease spasm and pain and allow for improved balance of musculature for improved function and decreased symptoms.  Therex: Educated on and practiced I's, Y's, T's, and W's to improve posture and help off-load T/L junction by increasing proximal stability.  Total time: 60 min.                    Trigger Point Dry Needling - 07/09/19 0001    Consent Given?  Yes    Education Handout Provided  No    Muscles Treated Back/Hip  Erector spinae;Lumbar multifidi;Thoracic multifidi    Dry Needling Comments  B    Electrical Stimulation Performed with Dry Needling  Yes   frequency 2, strong  pulsed    E-stim with Dry Needling Details  frequency 2, strong pulsed     Erector spinae Response  Twitch response elicited;Palpable increased muscle length    Lumbar multifidi Response  Twitch response elicited;Palpable increased muscle length    Thoracic multifidi response  Twitch response elicited;Palpable increased muscle length             PT Short Term Goals - 06/10/19 1116      PT SHORT TERM GOAL #1   Title  Patient will demonstrate coordinated diaphragmatic breathing with pelvic tilts to demonstrate improved control of diaphragm and TA, to allow for further strengthening of core musculature and decreased pelvic floor spasm.    Baseline  Pt. demonstrates breathing dysfunction and poor PFM coordination evidenced by anal manometry As of 2/22: Pt. continues to have restriction in her diaphragm and near T/L junction but is able to intentionally use diaphragmatic breathing through the  available ROM.    Time  5    Period  Weeks    Status  Achieved    Target Date  04/20/19      PT SHORT TERM GOAL #2   Title  Patient will report a reduction in pain to no greater than 6/10 over the prior week to demonstrate symptom improvement.    Baseline  Pain is 10/10 at worst, 2/10 at best As of 2/22: Pt. had achieved this goal for 1 week as of 1/21 but may have infection or other insult that caused pain to increase again.    Time  5    Period  Weeks    Status  On-going    Target Date  04/20/19      PT SHORT TERM GOAL #3   Title  Patient will demonstrate HEP x1 in the clinic to demonstrate understanding and proper form to allow for further improvement.    Baseline  Pt. lacks knowledge of therepeutic exercises that can decrease her pain/Sx.    Time  5    Period  Weeks    Status  Achieved    Target Date  04/20/19      PT SHORT TERM GOAL #4   Title  Patient will report consistent use of foot-stool (squatty-potty) for positioning with BM to decrease pain with BM and intra-abdominal pressure.     Baseline  Pt. having constipation due to PFM dysfunction    Time  5    Period  Weeks    Status  Achieved    Target Date  04/20/19        PT Long Term Goals - 06/10/19 0001      PT LONG TERM GOAL #1   Title  Pt. will be able to participate in regular ADL's with least restrictive device without pain increasing greater than 2/10    Baseline  Pt. limited in her ability to perform household duties by increased pain and fatigue.    Time  10    Period  Weeks    Status  On-going    Target Date  08/19/19      PT LONG TERM GOAL #2   Title  Patient will score at or below 65/300  on the PFDI and 35% on the Female NIH-CPSI to demonstrate a clinically meaningful decrease in disability and distress due to pelvic floor dysfunction.    Baseline  PFDI: 110/300, Female NIH-CPSI: 29/43 (67%)    Time  10    Period  Weeks    Status  Unable to assess    Target Date  08/19/19      PT LONG TERM GOAL #3   Title  Patient will report no pain with intercourse to demonstrate improved functional ability.    Baseline  Pt. Having significant pain with intercourse    Time  10    Period  Weeks    Status  On-going    Target Date  08/19/19      PT LONG TERM GOAL #4   Title  Pt will report ability to work in her yard for greater than 30 minutes without extreme fatigue    Baseline  limited to 10-15 minutes before pt requires ending activity    Time  10    Period  Weeks    Status  On-going    Target Date  08/19/19      PT LONG TERM GOAL #5   Title  Pt. will be able to go 2-3  hours between emptying her bladder without increased pain and empty her bladder fully whether by urostomy, cath, or voluntary release.    Baseline  Pt. has a urostomy but continues to have to self-catheterize. Has pain with bladder filling and when using catheter.    Time  10    Period  Weeks    Status  On-going    Target Date  08/19/19      PT LONG TERM GOAL #6   Title  Patient will report having BM's at least every-other day  with consistency between [facility] stool scale 3-5 over the prior week to demonstrate decreased constipation.    Baseline  Pt. unable to have regula BM's without medication, manometry shows PFM dysfunction    Time  10    Period  Weeks    Status  On-going    Target Date  08/19/19            Plan - 07/09/19 1327    Clinical Impression Statement  Pt. Responded well to all interventions today, demonstrating decreased spasms and TTP, and increased recruitment of scapular stabilizers, as well as understanding and correct performance of all education and exercises provided today. They will continue to benefit from skilled physical therapy to work toward remaining goals and maximize function as well as decrease likelihood of symptom increase or recurrence.     PT Next Visit Plan  review I's Y's and T's in prone, perform PNF for shoulder blades, rotational mobilizations through T-L junction, more scar-release and cupping at suprapubic scar, MFR around B ilium. re-assess ROM/back pain. TPDN to multifidus through T-L  junction with E-stim at 40hz  and at 2 hz. continue to work on thoracolumbar rotation ROM and decreasing spasms, add SLS balance training, begin strengthening for reciprocal inhibition. Internal pelvic exam,    PT Home Exercise Plan  diaphragmatic breathing, seated posture (lumbar support and avoiding adduction, low back stretch, side-stretch, hamstring stretch, butterfly stretch, bow-and-arrow, TENS trial, squatty potty, bowel retraining, toileting tips, colonic massage, thoracic extensions and supine chest stretch with towel roll, hip EXT in prone with knee bent, side-lying hip ABD, child's pose., self internal TP release, I's, Y's, T's, and W's.    Consulted and Agree with Plan of Care  Patient       Patient will benefit from skilled therapeutic intervention in order to improve the following deficits and impairments:     Visit Diagnosis: Other muscle spasm  Muscle weakness  (generalized)  Difficulty in walking, not elsewhere classified     Problem List Patient Active Problem List   Diagnosis Date Noted  . Seizure (HCC) 11/11/2018  . Major depressive disorder, recurrent episode, moderate (HCC) 02/07/2018  . Nephrolithiasis 04/16/2016  . Numbness 07/28/2015  . Bladder retention 06/23/2015  . Abdominal pain 06/04/2015  . Dizziness 05/18/2015  . Neck pain 05/18/2015  . Complicated migraine 04/28/2015  . Other fatigue 04/28/2015  . Abnormal finding on MRI of brain 04/28/2015  . D (diarrhea) 03/29/2015  . H/O disease 03/29/2015  . Abnormal weight loss 03/29/2015  . Muscle weakness (generalized) 03/14/2015  . Headache, migraine 03/10/2015   03/12/2015 DPT, ATC Cleophus Molt 07/09/2019, 1:28 PM  [facility] MAIN [facility] SERVICES [address] [place], [place], [place] Phone: [phone]   Fax:  [phone]  Name: Deanda Ruddell MRN: [name] Date of Birth: Feb 04, 1981

## 2019-07-14 ENCOUNTER — Ambulatory Visit: Payer: BC Managed Care – PPO

## 2019-07-14 ENCOUNTER — Other Ambulatory Visit: Payer: Self-pay

## 2019-07-14 DIAGNOSIS — M6281 Muscle weakness (generalized): Secondary | ICD-10-CM

## 2019-07-14 DIAGNOSIS — R262 Difficulty in walking, not elsewhere classified: Secondary | ICD-10-CM

## 2019-07-14 DIAGNOSIS — M62838 Other muscle spasm: Secondary | ICD-10-CM

## 2019-07-14 NOTE — Patient Instructions (Signed)
  Try using a tennis ball to release the muscles around your neck and shoulders if you feel pain/difficulty with I's, Y's etc.

## 2019-07-14 NOTE — Therapy (Signed)
[place] MAIN [facility] SERVICES [name] [place], [place], [address] Phone: [phone]   Fax:  [phone]  Physical Therapy Treatment  The patient has been informed of current processes in place at Outpatient Rehab to protect patients from Covid-19 exposure including social distancing, schedule modifications, and new cleaning procedures. After discussing their particular risk with a therapist based on the patient's personal risk factors, the patient has decided to proceed with in-person therapy.   Patient Details  Name: Christine Chavez MRN: [id number] Date of Birth: 16-Apr-1981 Referring Provider (PT): [name]   Encounter Date: 07/14/2019  PT End of Session - 07/15/19 0944    Visit Number  28    Number of Visits  40    Date for PT Re-Evaluation  05/21/19    Authorization Type  BCBS    Authorization Time Period  from 03/12/2019 through 05/21/2019    Authorization - Visit Number  8    Authorization - Number of Visits  20    Progress Note Due on Visit  30    PT Start Time  0940    PT Stop Time  1040    PT Time Calculation (min)  60 min    Activity Tolerance  Patient tolerated treatment well;No increased pain    Behavior During Therapy  WFL for tasks assessed/performed       Past Medical History:  Diagnosis Date  . Complication of anesthesia    ? seizures after anesthesia   . Headache   . Migraines   . Neurogenic bladder   . Renal disorder   . Vision abnormalities     Past Surgical History:  Procedure Laterality Date  . ANTERIOR CRUCIATE LIGAMENT REPAIR  1997  . APPENDECTOMY    . COLONOSCOPY WITH PROPOFOL N/A 11/11/2018   Procedure: COLONOSCOPY WITH PROPOFOL;  Surgeon: [name], MD;  Location: [facility] ENDOSCOPY;  Service: Endoscopy;  Laterality: N/A;  . CYSTOSCOPY WITH STENT PLACEMENT Right 04/17/2016   Procedure: CYSTOSCOPY WITH STENT PLACEMENT;  Surgeon: [name], MD;  Location: [facility] ORS;   Service: Urology;  Laterality: Right;  . ESOPHAGOGASTRODUODENOSCOPY (EGD) WITH PROPOFOL N/A 11/11/2018   Procedure: ESOPHAGOGASTRODUODENOSCOPY (EGD) WITH PROPOFOL;  Surgeon: [name], MD;  Location: [facility] ENDOSCOPY;  Service: Endoscopy;  Laterality: N/A;  . EXPLORATORY LAPAROTOMY  1999  . KIDNEY STONE SURGERY  04/2016  . REVISION UROSTOMY CUTANEOUS    . REVISION UROSTOMY CUTANEOUS  01/10/2018  . SUPRAPUBIC CATHETER PLACEMENT  08/2017  . TONSILLECTOMY      There were no vitals filed for this visit.  Pelvic Floor Physical Therapy Treatment Note  SCREENING  Changes in medications, allergies, or medical history?: none   SUBJECTIVE  Patient reports: Following last visit, she was able to have a BM two days in a row with no assistance but it has tightened back down some, had to use an Enema Sunday. Feels that she is pretty confident that she is having a UTI still but that the baclofen and gabapentin are decreasing the frequency and intensity of bladder/pelvic spasms. Has not needed the oxybutinin often. Her legs do not feel as stiff/rigid which is a little worse when she is stressed or tired but she can stand and walk a little longer. Has been having pain on the inside of her L>R knee/thigh (~2/10 pain). Smell of her urine is still off.   Precautions:  Neurological diagnosis pending, has "non-specific" brain lesions   Pain update:  Location  of pain: B flank pain (pelvic area) Current pain:  4/10 (4/10, increases with motion) Max pain:  8/10 (8/10 when it spasms) Least pain:  3/10 (3/10) Nature of pain: sharp to dull ache   Patient Goals: Get rid of the discomfort with catheterizing and ache/bladder pain. Be able to take fewer medications and be able to enjoy spending time with her family again, doing "all the mom things"    OBJECTIVE  Changes in: Range of Motion/Flexibilty:  (from 12/10) Spine: ~ 2-3 fingers from knee with SB B. ~ 50% reduced R rotation with stretch in  L, ~ 75% reduced L rotation with increased tightness. Forward bend: 19 in.from floor. Hips: Unable to achieve neutral pelvis in long-sitting due to hamstring tightness.  Today: Pt demonstrated difficulty achieving full upward rotation of the scapula B due to spasm in rhomboids and middle trapezius.  Strength/MMT:  LE MMT (from 12-10) LE MMT Left Right  Hip flex:  (L2) /5 /5  Hip ext: 3+/5 3+/5  Hip abd: 4/5 4/5  Hip add: 4/5 3+/5  Hip IR 4/5 3+/5  Hip ER 4/5 4/5   (From 3/24): Pt. Demonstrates 3/5 strength with "I's, Y's, and T's" but 4/5 with W's.    Pelvic Floor External Exam: (From prior session)- [Introitus Appears: elevated Skin integrity: WNL Palpation: TTP to B STP Cough: limited motion Prolapse visible?: no Scar mobility: not assessed  Internal Vaginal Exam: Strength (PERF): Pt. Has difficulty sensing the PFM to generate contraction or relaxation due to high tone/neuro involvement Symmetry: L>R for spasm/TTP Palpation: TTP to all muscles throughout B Prolapse: none]  Palpation:  TTP to B rhomboids and middle trap  Gait Analysis:  INTERVENTIONS THIS SESSION:  Manual: Performed TP release and B rhomboids and middle trap  to decrease spasm and pain and allow for improved balance of musculature for improved function and allow for improved scapular ROM to facilitate PNF stabilization of scapula.   NM Re-ed: Performed PNF patterns to facilitate improved scapular depression, retraction, and downward rotation B to improve scapulohumeral rhythm and stability to facilitate strengthening and improved posture/function.  Therex: Reviewed and practiced I's, Y's, T's, and W's 2x5 to improve posture and help off-load T/L junction by increasing proximal stability.  Total time: 60 min.                               PT Short Term Goals - 06/10/19 1116      PT SHORT TERM GOAL #1   Title  Patient will demonstrate coordinated diaphragmatic  breathing with pelvic tilts to demonstrate improved control of diaphragm and TA, to allow for further strengthening of core musculature and decreased pelvic floor spasm.    Baseline  Pt. demonstrates breathing dysfunction and poor PFM coordination evidenced by anal manometry As of 2/22: Pt. continues to have restriction in her diaphragm and near T/L junction but is able to intentionally use diaphragmatic breathing through the available ROM.    Time  5    Period  Weeks    Status  Achieved    Target Date  04/20/19      PT SHORT TERM GOAL #2   Title  Patient will report a reduction in pain to no greater than 6/10 over the prior week to demonstrate symptom improvement.    Baseline  Pain is 10/10 at worst, 2/10 at best As of 2/22: Pt. had achieved this goal for 1 week as of 1/21  but may have infection or other insult that caused pain to increase again.    Time  5    Period  Weeks    Status  On-going    Target Date  04/20/19      PT SHORT TERM GOAL #3   Title  Patient will demonstrate HEP x1 in the clinic to demonstrate understanding and proper form to allow for further improvement.    Baseline  Pt. lacks knowledge of therepeutic exercises that can decrease her pain/Sx.    Time  5    Period  Weeks    Status  Achieved    Target Date  04/20/19      PT SHORT TERM GOAL #4   Title  Patient will report consistent use of foot-stool (squatty-potty) for positioning with BM to decrease pain with BM and intra-abdominal pressure.    Baseline  Pt. having constipation due to PFM dysfunction    Time  5    Period  Weeks    Status  Achieved    Target Date  04/20/19        PT Long Term Goals - 06/10/19 0001      PT LONG TERM GOAL #1   Title  Pt. will be able to participate in regular ADL's with least restrictive device without pain increasing greater than 2/10    Baseline  Pt. limited in her ability to perform household duties by increased pain and fatigue.    Time  10    Period  Weeks    Status   On-going    Target Date  08/19/19      PT LONG TERM GOAL #2   Title  Patient will score at or below 65/300  on the PFDI and 35% on the Female NIH-CPSI to demonstrate a clinically meaningful decrease in disability and distress due to pelvic floor dysfunction.    Baseline  PFDI: 110/300, Female NIH-CPSI: 29/43 (67%)    Time  10    Period  Weeks    Status  Unable to assess    Target Date  08/19/19      PT LONG TERM GOAL #3   Title  Patient will report no pain with intercourse to demonstrate improved functional ability.    Baseline  Pt. Having significant pain with intercourse    Time  10    Period  Weeks    Status  On-going    Target Date  08/19/19      PT LONG TERM GOAL #4   Title  Pt will report ability to work in her yard for greater than 30 minutes without extreme fatigue    Baseline  limited to 10-15 minutes before pt requires ending activity    Time  10    Period  Weeks    Status  On-going    Target Date  08/19/19      PT LONG TERM GOAL #5   Title  Pt. will be able to go 2-3 hours between emptying her bladder without increased pain and empty her bladder fully whether by urostomy, cath, or voluntary release.    Baseline  Pt. has a urostomy but continues to have to self-catheterize. Has pain with bladder filling and when using catheter.    Time  10    Period  Weeks    Status  On-going    Target Date  08/19/19      PT LONG TERM GOAL #6   Title  Patient will report having BM's  at least every-other day with consistency between [facility] stool scale 3-5 over the prior week to demonstrate decreased constipation.    Baseline  Pt. unable to have regula BM's without medication, manometry shows PFM dysfunction    Time  10    Period  Weeks    Status  On-going    Target Date  08/19/19            Plan - 07/15/19 0944    Clinical Impression Statement  Pt. Responded well to all interventions today, demonstrating improved scapular coordination and ROM on L>R, decreased spasm and  TTP, as well as understanding and correct performance of all education and exercises provided today. They will continue to benefit from skilled physical therapy to work toward remaining goals and maximize function as well as decrease likelihood of symptom increase or recurrence.     PT Next Visit Plan  review I's Y's and T's in prone, thoracic mobility? perform further release and PNF for R>L shoulder blades, rotational mobilizations through T-L junction, more scar-release and cupping at suprapubic scar, MFR around B ilium. re-assess ROM/back pain. TPDN to multifidus through T-L  junction with E-stim at 40hz  and at 2 hz. continue to work on thoracolumbar rotation ROM and decreasing spasms, add SLS balance training, begin strengthening for reciprocal inhibition. Internal pelvic exam,    PT Home Exercise Plan  diaphragmatic breathing, seated posture (lumbar support and avoiding adduction, low back stretch, side-stretch, hamstring stretch, butterfly stretch, bow-and-arrow, TENS trial, squatty potty, bowel retraining, toileting tips, colonic massage, thoracic extensions and supine chest stretch with towel roll, hip EXT in prone with knee bent, side-lying hip ABD, child's pose., self internal TP release, I's, Y's, T's, and W's, self TP release to neck/shoulder area.    Consulted and Agree with Plan of Care  Patient       Patient will benefit from skilled therapeutic intervention in order to improve the following deficits and impairments:     Visit Diagnosis: Other muscle spasm  Muscle weakness (generalized)  Difficulty in walking, not elsewhere classified     Problem List Patient Active Problem List   Diagnosis Date Noted  . Seizure (HCC) 11/11/2018  . Major depressive disorder, recurrent episode, moderate (HCC) 02/07/2018  . Nephrolithiasis 04/16/2016  . Numbness 07/28/2015  . Bladder retention 06/23/2015  . Abdominal pain 06/04/2015  . Dizziness 05/18/2015  . Neck pain 05/18/2015  .  Complicated migraine 04/28/2015  . Other fatigue 04/28/2015  . Abnormal finding on MRI of brain 04/28/2015  . D (diarrhea) 03/29/2015  . H/O disease 03/29/2015  . Abnormal weight loss 03/29/2015  . Muscle weakness (generalized) 03/14/2015  . Headache, migraine 03/10/2015   03/12/2015 DPT, ATC Cleophus Molt 07/15/2019, 9:59 AM  [facility] MAIN [facility] SERVICES [address] [place], [place], [place] Phone: [phone]   Fax:  [phone]  Name: Christine Chavez MRN: [name] Date of Birth: 09-11-1980

## 2019-07-16 ENCOUNTER — Other Ambulatory Visit: Payer: Self-pay

## 2019-07-16 ENCOUNTER — Ambulatory Visit: Payer: BC Managed Care – PPO

## 2019-07-16 DIAGNOSIS — M62838 Other muscle spasm: Secondary | ICD-10-CM

## 2019-07-16 DIAGNOSIS — M6281 Muscle weakness (generalized): Secondary | ICD-10-CM

## 2019-07-16 DIAGNOSIS — R262 Difficulty in walking, not elsewhere classified: Secondary | ICD-10-CM

## 2019-07-16 NOTE — Therapy (Addendum)
[place] [facility] MAIN [facility] SERVICES [address] [place], [place], [address] Phone: [phone]   Fax:  [phone]  Physical Therapy Treatment  The patient has been informed of current processes in place at Outpatient Rehab to protect patients from Covid-19 exposure including social distancing, schedule modifications, and new cleaning procedures. After discussing their particular risk with a therapist based on the patient's personal risk factors, the patient has decided to proceed with in-person therapy.   Patient Details  Name: Christine Chavez MRN: [id number] Date of Birth: Jul 07, 1980 Referring Provider (PT): [name]   Encounter Date: 07/16/2019  PT End of Session - 07/30/19 1255    Visit Number  29    Number of Visits  40    Date for PT Re-Evaluation  05/21/19    Authorization Type  BCBS    Authorization Time Period  from 03/12/2019 through 05/21/2019    Authorization - Visit Number  9    Authorization - Number of Visits  20    Progress Note Due on Visit  30    PT Start Time  1000    PT Stop Time  1100    PT Time Calculation (min)  60 min    Activity Tolerance  Patient tolerated treatment well;No increased pain    Behavior During Therapy  WFL for tasks assessed/performed       Past Medical History:  Diagnosis Date  . Complication of anesthesia    ? seizures after anesthesia   . Headache   . Migraines   . Neurogenic bladder   . Renal disorder   . Vision abnormalities     Past Surgical History:  Procedure Laterality Date  . ANTERIOR CRUCIATE LIGAMENT REPAIR  1997  . APPENDECTOMY    . COLONOSCOPY WITH PROPOFOL N/A 11/11/2018   Procedure: COLONOSCOPY WITH PROPOFOL;  Surgeon: Christena Deem, MD;  Location: [facility] ENDOSCOPY;  Service: Endoscopy;  Laterality: N/A;  . CYSTOSCOPY WITH STENT PLACEMENT Right 04/17/2016   Procedure: CYSTOSCOPY WITH STENT PLACEMENT;  Surgeon: Malen Gauze, MD;  Location: [facility] ORS;   Service: Urology;  Laterality: Right;  . ESOPHAGOGASTRODUODENOSCOPY (EGD) WITH PROPOFOL N/A 11/11/2018   Procedure: ESOPHAGOGASTRODUODENOSCOPY (EGD) WITH PROPOFOL;  Surgeon: Christena Deem, MD;  Location: [facility] ENDOSCOPY;  Service: Endoscopy;  Laterality: N/A;  . EXPLORATORY LAPAROTOMY  1999  . KIDNEY STONE SURGERY  04/2016  . REVISION UROSTOMY CUTANEOUS    . REVISION UROSTOMY CUTANEOUS  01/10/2018  . SUPRAPUBIC CATHETER PLACEMENT  08/2017  . TONSILLECTOMY      There were no vitals filed for this visit.   Pelvic Floor Physical Therapy Treatment Note  SCREENING  Changes in medications, allergies, or medical history?: none   SUBJECTIVE  Patient reports: Her WBC is up and bacteria is high, pretty sure she has a UTI, numbers are even higher than the last one (per my-chart) but she is waiting on her results from MD. Has been hurting in her L kidney area.    Precautions:  Neurological diagnosis pending, has "non-specific" brain lesions   Pain update:  Location of pain: B flank pain (pelvic area) Current pain:  4/10 (4/10, increases with motion) Max pain:  8/10 (8/10 when it spasms) Least pain:  3/10 (3/10) Nature of pain: sharp to dull ache   Patient Goals: Get rid of the discomfort with catheterizing and ache/bladder pain. Be able to take fewer medications and be able to enjoy spending time with her family again, doing "all the  mom things"    OBJECTIVE  Changes in: Range of Motion/Flexibilty:  (from 12/10) Spine: ~ 2-3 fingers from knee with SB B. ~ 50% reduced R rotation with stretch in L, ~ 75% reduced L rotation with increased tightness. Forward bend: 19 in.from floor. Hips: Unable to achieve neutral pelvis in long-sitting due to hamstring tightness.  Today: Pt demonstrated difficulty achieving full upward rotation of the scapula B due to spasm in rhomboids and middle trapezius.  Strength/MMT:  LE MMT (from 12-10) LE MMT Left Right  Hip flex:  (L2) /5 /5  Hip  ext: 3+/5 3+/5  Hip abd: 4/5 4/5  Hip add: 4/5 3+/5  Hip IR 4/5 3+/5  Hip ER 4/5 4/5   (From 3/24): Pt. Demonstrates 3/5 strength with "I's, Y's, and T's" but 4/5 with W's.    Pelvic Floor External Exam: (From prior session)- [Introitus Appears: elevated Skin integrity: WNL Palpation: TTP to B STP Cough: limited motion Prolapse visible?: no Scar mobility: not assessed  Internal Vaginal Exam: Strength (PERF): Pt. Has difficulty sensing the PFM to generate contraction or relaxation due to high tone/neuro involvement Symmetry: L>R for spasm/TTP Palpation: TTP to all muscles throughout B Prolapse: none]  Palpation:  TTP to B rhomboids and middle trap  Gait Analysis:  INTERVENTIONS THIS SESSION:  Manual: Performed TP release to R rhomboids, Upper trap, middle trap, levator scap, and subscapularis  to decrease spasm and pain and allow for improved balance of musculature for improved function and allowance for improved scapular ROM to facilitate PNF stabilization of scapula.   NM Re-ed: Performed PNF patterns to facilitate improved scapular depression, retraction, and downward rotation on R to improve scapulohumeral rhythm and stability to facilitate strengthening and improved posture/function.  Self-care: Educated on "posturific" brace for improving upper posture and off-loading the T-L junction to improve overall function.  Total time: 60 min.                             PT Short Term Goals - 06/10/19 1116      PT SHORT TERM GOAL #1   Title  Patient will demonstrate coordinated diaphragmatic breathing with pelvic tilts to demonstrate improved control of diaphragm and TA, to allow for further strengthening of core musculature and decreased pelvic floor spasm.    Baseline  Pt. demonstrates breathing dysfunction and poor PFM coordination evidenced by anal manometry As of 2/22: Pt. continues to have restriction in her diaphragm and near T/L junction but  is able to intentionally use diaphragmatic breathing through the available ROM.    Time  5    Period  Weeks    Status  Achieved    Target Date  04/20/19      PT SHORT TERM GOAL #2   Title  Patient will report a reduction in pain to no greater than 6/10 over the prior week to demonstrate symptom improvement.    Baseline  Pain is 10/10 at worst, 2/10 at best As of 2/22: Pt. had achieved this goal for 1 week as of 1/21 but may have infection or other insult that caused pain to increase again.    Time  5    Period  Weeks    Status  On-going    Target Date  04/20/19      PT SHORT TERM GOAL #3   Title  Patient will demonstrate HEP x1 in the clinic to demonstrate understanding and proper form to allow for  further improvement.    Baseline  Pt. lacks knowledge of therepeutic exercises that can decrease her pain/Sx.    Time  5    Period  Weeks    Status  Achieved    Target Date  04/20/19      PT SHORT TERM GOAL #4   Title  Patient will report consistent use of foot-stool (squatty-potty) for positioning with BM to decrease pain with BM and intra-abdominal pressure.    Baseline  Pt. having constipation due to PFM dysfunction    Time  5    Period  Weeks    Status  Achieved    Target Date  04/20/19        PT Long Term Goals - 06/10/19 0001      PT LONG TERM GOAL #1   Title  Pt. will be able to participate in regular ADL's with least restrictive device without pain increasing greater than 2/10    Baseline  Pt. limited in her ability to perform household duties by increased pain and fatigue.    Time  10    Period  Weeks    Status  On-going    Target Date  08/19/19      PT LONG TERM GOAL #2   Title  Patient will score at or below 65/300  on the PFDI and 35% on the Female NIH-CPSI to demonstrate a clinically meaningful decrease in disability and distress due to pelvic floor dysfunction.    Baseline  PFDI: 110/300, Female NIH-CPSI: 29/43 (67%)    Time  10    Period  Weeks    Status   Unable to assess    Target Date  08/19/19      PT LONG TERM GOAL #3   Title  Patient will report no pain with intercourse to demonstrate improved functional ability.    Baseline  Pt. Having significant pain with intercourse    Time  10    Period  Weeks    Status  On-going    Target Date  08/19/19      PT LONG TERM GOAL #4   Title  Pt will report ability to work in her yard for greater than 30 minutes without extreme fatigue    Baseline  limited to 10-15 minutes before pt requires ending activity    Time  10    Period  Weeks    Status  On-going    Target Date  08/19/19      PT LONG TERM GOAL #5   Title  Pt. will be able to go 2-3 hours between emptying her bladder without increased pain and empty her bladder fully whether by urostomy, cath, or voluntary release.    Baseline  Pt. has a urostomy but continues to have to self-catheterize. Has pain with bladder filling and when using catheter.    Time  10    Period  Weeks    Status  On-going    Target Date  08/19/19      PT LONG TERM GOAL #6   Title  Patient will report having BM's at least every-other day with consistency between [facility] stool scale 3-5 over the prior week to demonstrate decreased constipation.    Baseline  Pt. unable to have regula BM's without medication, manometry shows PFM dysfunction    Time  10    Period  Weeks    Status  On-going    Target Date  08/19/19  Plan - 07/30/19 1255    Clinical Impression Statement  Pt. Responded well to all interventions today, demonstrating decreased spasm, improved scapular mobility and recruitment of stabilizers, as well as understanding of all education provided today. They will continue to benefit from skilled physical therapy to work toward remaining goals and maximize function as well as decrease likelihood of symptom increase or recurrence.     PT Next Visit Plan  review I's Y's and T's in prone, thoracic mobility? perform further release and PNF for R>L  shoulder blades, rotational mobilizations through T-L junction, more scar-release and cupping at suprapubic scar, MFR around B ilium. re-assess ROM/back pain. TPDN to multifidus through T-L  junction with E-stim at 40hz  and at 2 hz. continue to work on thoracolumbar rotation ROM and decreasing spasms, add SLS balance training, begin strengthening for reciprocal inhibition. Internal pelvic exam,    PT Home Exercise Plan  diaphragmatic breathing, seated posture (lumbar support and avoiding adduction, low back stretch, side-stretch, hamstring stretch, butterfly stretch, bow-and-arrow, TENS trial, squatty potty, bowel retraining, toileting tips, colonic massage, thoracic extensions and supine chest stretch with towel roll, hip EXT in prone with knee bent, side-lying hip ABD, child's pose., self internal TP release, I's, Y's, T's, and W's, self TP release to neck/shoulder area.    Consulted and Agree with Plan of Care  Patient       Patient will benefit from skilled therapeutic intervention in order to improve the following deficits and impairments:     Visit Diagnosis: Other muscle spasm  Muscle weakness (generalized)  Difficulty in walking, not elsewhere classified     Problem List Patient Active Problem List   Diagnosis Date Noted  . Seizure ([place]) 11/11/2018  . Major depressive disorder, recurrent episode, moderate ([place]) 02/07/2018  . Nephrolithiasis 04/16/2016  . Numbness 07/28/2015  . Bladder retention 06/23/2015  . Abdominal pain 06/04/2015  . Dizziness 05/18/2015  . Neck pain 05/18/2015  . Complicated migraine 62/95/2841  . Other fatigue 04/28/2015  . Abnormal finding on MRI of brain 04/28/2015  . D (diarrhea) 03/29/2015  . H/O disease 03/29/2015  . Abnormal weight loss 03/29/2015  . Muscle weakness (generalized) 03/14/2015  . Headache, migraine 03/10/2015   Willa Rough DPT, ATC Willa Rough 07/30/2019, 12:59 PM  [place] MAIN  [facility] SERVICES [address] [place], [place], [address] Phone: [phone]   Fax:  [phone]  Name: Tinaya Ceballos MRN: [id number] Date of Birth: [birth year]

## 2019-07-16 NOTE — Patient Instructions (Signed)
Posturific brace

## 2019-07-31 ENCOUNTER — Other Ambulatory Visit: Payer: Self-pay

## 2019-07-31 ENCOUNTER — Ambulatory Visit: Payer: BC Managed Care – PPO | Attending: Gastroenterology

## 2019-07-31 DIAGNOSIS — M6281 Muscle weakness (generalized): Secondary | ICD-10-CM | POA: Diagnosis present

## 2019-07-31 DIAGNOSIS — R262 Difficulty in walking, not elsewhere classified: Secondary | ICD-10-CM

## 2019-07-31 DIAGNOSIS — M62838 Other muscle spasm: Secondary | ICD-10-CM | POA: Diagnosis present

## 2019-07-31 NOTE — Patient Instructions (Signed)
   Intentional rest!!!!!!! Let yourself heal and recover from over-doing it.

## 2019-07-31 NOTE — Therapy (Signed)
[facility] MAIN [facility] SERVICES [address] [place], [place], [address] Phone: [phone]   Fax:  [phone]  Physical Therapy Treatment  The patient has been informed of current processes in place at Outpatient Rehab to protect patients from Covid-19 exposure including social distancing, schedule modifications, and new cleaning procedures. After discussing their particular risk with a therapist based on the patient's personal risk factors, the patient has decided to proceed with in-person therapy.   Patient Details  Name: Christine Chavez MRN: [id number] Date of Birth: 1980/08/01 Referring Provider (PT): Christine Chavez   Encounter Date: 07/31/2019  PT End of Session - 07/31/19 1023    Visit Number  30    Number of Visits  40    Date for PT Re-Evaluation  08/19/19    Authorization Type  [employer]    Authorization Time Period  from 05/25/2019 through 08/19/2019    Authorization - Visit Number  10    Authorization - Number of Visits  20    Progress Note Due on Visit  30    PT Start Time  0905    PT Stop Time  1005    PT Time Calculation (min)  60 min    Activity Tolerance  Patient tolerated treatment well;No increased pain    Behavior During Therapy  WFL for tasks assessed/performed       Past Medical History:  Diagnosis Date  . Complication of anesthesia    ? seizures after anesthesia   . Headache   . Migraines   . Neurogenic bladder   . Renal disorder   . Vision abnormalities     Past Surgical History:  Procedure Laterality Date  . ANTERIOR CRUCIATE LIGAMENT REPAIR  1997  . APPENDECTOMY    . COLONOSCOPY WITH PROPOFOL N/A 11/11/2018   Procedure: COLONOSCOPY WITH PROPOFOL;  Surgeon: Christine Deem, MD;  Location: [facility] ENDOSCOPY;  Service: Endoscopy;  Laterality: N/A;  . CYSTOSCOPY WITH STENT PLACEMENT Right 04/17/2016   Procedure: CYSTOSCOPY WITH STENT PLACEMENT;  Surgeon: Christine Gauze, MD;  Location: [facility] ORS;   Service: Urology;  Laterality: Right;  . ESOPHAGOGASTRODUODENOSCOPY (EGD) WITH PROPOFOL N/A 11/11/2018   Procedure: ESOPHAGOGASTRODUODENOSCOPY (EGD) WITH PROPOFOL;  Surgeon: Christine Deem, MD;  Location: [facility] ENDOSCOPY;  Service: Endoscopy;  Laterality: N/A;  . EXPLORATORY LAPAROTOMY  1999  . KIDNEY STONE SURGERY  04/2016  . REVISION UROSTOMY CUTANEOUS    . REVISION UROSTOMY CUTANEOUS  01/10/2018  . SUPRAPUBIC CATHETER PLACEMENT  08/2017  . TONSILLECTOMY      There were no vitals filed for this visit.   Pelvic Floor Physical Therapy Treatment Note  SCREENING  Changes in medications, allergies, or medical history?: Had a CT which was normal.    SUBJECTIVE  Patient reports: Was miserable on Monday from the infection, had to use a suppository but after that it seems to be getting better. Feels like she has been swollen, not sure if it is from the medicine. Is dragging today. Catheterizing is worse when her spasms are increased, have not been crazy. Has tension/pain in her neck/shoulders/back/thighs that feels like she worked out "for days" that she can only think to attribute to doing more with coming home from the beach, unpacking, etc. Both pain and fatigue increase as the day goes on. Was pushing herself a little harder, using her walker or her husband for assistance and getting out to the beach.    Precautions:  Neurological diagnosis pending, has "non-specific" brain  lesions   Pain update:  Location of pain: B flank pain (pelvic area) Current pain:  2/10 (3/10, increases with motion) Max pain:  9/10 (9/10 when it spasms) Least pain:  2/10 (2/10) Nature of pain: sharp to dull ache  ** Feels "looser" following treatment.  Patient Goals: Get rid of the discomfort with catheterizing and ache/bladder pain. Be able to take fewer medications and be able to enjoy spending time with her family again, doing "all the mom things"    OBJECTIVE  Changes in: Range of  Motion/Flexibilty:  (from 12/10) Spine: ~ 2-3 fingers from knee with SB B. ~ 50% reduced R rotation with stretch in L, ~ 75% reduced L rotation with increased tightness. Forward bend: 19 in.from floor. Hips: Unable to achieve neutral pelvis in long-sitting due to hamstring tightness.  Today: Decreased PIVM at C-T and T-L junctions as well as mid-thoracic region.  Strength/MMT:  LE MMT (from 12-10) LE MMT Left Right  Hip flex:  (L2) /5 /5  Hip ext: 3+/5 3+/5  Hip abd: 4/5 4/5  Hip add: 4/5 3+/5  Hip IR 4/5 3+/5  Hip ER 4/5 4/5   (From 3/24): Pt. Demonstrates 3/5 strength with "I's, Y's, and T's" but 4/5 with W's.  Pelvic Floor External Exam: (From prior session)- [Introitus Appears: elevated Skin integrity: WNL Palpation: TTP to B STP Cough: limited motion Prolapse visible?: no Scar mobility: not assessed  Internal Vaginal Exam: Strength (PERF): Pt. Has difficulty sensing the PFM to generate contraction or relaxation due to high tone/neuro involvement Symmetry: L>R for spasm/TTP Palpation: TTP to all muscles throughout B Prolapse: none]  Palpation: decreased fascial mobility at C-T junction, and from T-L junction all the way down to the sacrum as well as laterally from the rib cage to the iliac crest.  Gait Analysis:  INTERVENTIONS THIS SESSION:  Manual: Performed STM and MFR with cupping cups at C-T junction, and from T-L junction all the way down to the sacrum as well as laterally from the rib cage to the iliac crest.  Self-care: Educated on chronic pain/fatigue and energy conservation and moderating activity levels within 1-2 points of baseline pain/ fatigue to make sure we are gently increasing activity tolerance and not over-doing it.   Total time: 60 min.                           PT Short Term Goals - 06/10/19 1116      PT SHORT TERM GOAL #1   Title  Patient will demonstrate coordinated diaphragmatic breathing with pelvic tilts to  demonstrate improved control of diaphragm and TA, to allow for further strengthening of core musculature and decreased pelvic floor spasm.    Baseline  Pt. demonstrates breathing dysfunction and poor PFM coordination evidenced by anal manometry As of 2/22: Pt. continues to have restriction in her diaphragm and near T/L junction but is able to intentionally use diaphragmatic breathing through the available ROM.    Time  5    Period  Weeks    Status  Achieved    Target Date  04/20/19      PT SHORT TERM GOAL #2   Title  Patient will report a reduction in pain to no greater than 6/10 over the prior week to demonstrate symptom improvement.    Baseline  Pain is 10/10 at worst, 2/10 at best As of 2/22: Pt. had achieved this goal for 1 week as of 1/21 but may  have infection or other insult that caused pain to increase again.    Time  5    Period  Weeks    Status  On-going    Target Date  04/20/19      PT SHORT TERM GOAL #3   Title  Patient will demonstrate HEP x1 in the clinic to demonstrate understanding and proper form to allow for further improvement.    Baseline  Pt. lacks knowledge of therepeutic exercises that can decrease her pain/Sx.    Time  5    Period  Weeks    Status  Achieved    Target Date  04/20/19      PT SHORT TERM GOAL #4   Title  Patient will report consistent use of foot-stool (squatty-potty) for positioning with BM to decrease pain with BM and intra-abdominal pressure.    Baseline  Pt. having constipation due to PFM dysfunction    Time  5    Period  Weeks    Status  Achieved    Target Date  04/20/19        PT Long Term Goals - 06/10/19 0001      PT LONG TERM GOAL #1   Title  Pt. will be able to participate in regular ADL's with least restrictive device without pain increasing greater than 2/10    Baseline  Pt. limited in her ability to perform household duties by increased pain and fatigue.    Time  10    Period  Weeks    Status  On-going    Target Date   08/19/19      PT LONG TERM GOAL #2   Title  Patient will score at or below 65/300  on the PFDI and 35% on the Female NIH-CPSI to demonstrate a clinically meaningful decrease in disability and distress due to pelvic floor dysfunction.    Baseline  PFDI: 110/300, Female NIH-CPSI: 29/43 (67%)    Time  10    Period  Weeks    Status  Unable to assess    Target Date  08/19/19      PT LONG TERM GOAL #3   Title  Patient will report no pain with intercourse to demonstrate improved functional ability.    Baseline  Pt. Having significant pain with intercourse    Time  10    Period  Weeks    Status  On-going    Target Date  08/19/19      PT LONG TERM GOAL #4   Title  Pt will report ability to work in her yard for greater than 30 minutes without extreme fatigue    Baseline  limited to 10-15 minutes before pt requires ending activity    Time  10    Period  Weeks    Status  On-going    Target Date  08/19/19      PT LONG TERM GOAL #5   Title  Pt. will be able to go 2-3 hours between emptying her bladder without increased pain and empty her bladder fully whether by urostomy, cath, or voluntary release.    Baseline  Pt. has a urostomy but continues to have to self-catheterize. Has pain with bladder filling and when using catheter.    Time  10    Period  Weeks    Status  On-going    Target Date  08/19/19      PT LONG TERM GOAL #6   Title  Patient will report having BM's at least  every-other day with consistency between [facility] stool scale 3-5 over the prior week to demonstrate decreased constipation.    Baseline  Pt. unable to have regula BM's without medication, manometry shows PFM dysfunction    Time  10    Period  Weeks    Status  On-going    Target Date  08/19/19            Plan - 07/31/19 1025    Clinical Impression Statement  Pt. Responded well to all interventions today, demonstrating improved spinal and fascial mobility as well as understanding and correct performance of all  education and exercises provided today. They will continue to benefit from skilled physical therapy to work toward remaining goals and maximize function as well as decrease likelihood of symptom increase or recurrence.     PT Next Visit Plan  re-visit PFM/spasm reduction if no infection, ask about intentional rest, review I's Y's and T's in prone, thoracic mobility? perform further release and PNF for R>L shoulder blades, rotational mobilizations through T-L junction, more scar-release and cupping at suprapubic scar, MFR around B ilium. re-assess ROM/back pain. TPDN to multifidus through T-L  junction with E-stim at 40hz  and at 2 hz. continue to work on thoracolumbar rotation ROM and decreasing spasms, add SLS balance training, begin strengthening for reciprocal inhibition. Internal pelvic exam,    PT Home Exercise Plan  diaphragmatic breathing, seated posture (lumbar support and avoiding adduction, low back stretch, side-stretch, hamstring stretch, butterfly stretch, bow-and-arrow, TENS trial, squatty potty, bowel retraining, toileting tips, colonic massage, thoracic extensions and supine chest stretch with towel roll, hip EXT in prone with knee bent, side-lying hip ABD, child's pose., self internal TP release, I's, Y's, T's, and W's, self TP release to neck/shoulder area, limits of chronic pain/chronic fatigue and moderating activity level.    Consulted and Agree with Plan of Care  Patient       Patient will benefit from skilled therapeutic intervention in order to improve the following deficits and impairments:     Visit Diagnosis: Other muscle spasm  Muscle weakness (generalized)  Difficulty in walking, not elsewhere classified     Problem List Patient Active Problem List   Diagnosis Date Noted  . Seizure ([place]) 11/11/2018  . Major depressive disorder, recurrent episode, moderate ([place]) 02/07/2018  . Nephrolithiasis 04/16/2016  . Numbness 07/28/2015  . Bladder retention 06/23/2015  .  Abdominal pain 06/04/2015  . Dizziness 05/18/2015  . Neck pain 05/18/2015  . Complicated migraine 38/18/2993  . Other fatigue 04/28/2015  . Abnormal finding on MRI of brain 04/28/2015  . D (diarrhea) 03/29/2015  . H/O disease 03/29/2015  . Abnormal weight loss 03/29/2015  . Muscle weakness (generalized) 03/14/2015  . Headache, migraine 03/10/2015   Willa Rough DPT, ATC Willa Rough 07/31/2019, 10:46 AM  [place] MAIN [facility] SERVICES [address] [place], [place], [address] Phone: [phone]   Fax:  [phone]  Name: Christine Chavez MRN: [id number] Date of Birth: 1980-11-30

## 2019-08-05 ENCOUNTER — Other Ambulatory Visit: Payer: Self-pay

## 2019-08-05 ENCOUNTER — Ambulatory Visit: Payer: BC Managed Care – PPO

## 2019-08-05 DIAGNOSIS — R262 Difficulty in walking, not elsewhere classified: Secondary | ICD-10-CM

## 2019-08-05 DIAGNOSIS — M62838 Other muscle spasm: Secondary | ICD-10-CM | POA: Diagnosis not present

## 2019-08-05 DIAGNOSIS — M6281 Muscle weakness (generalized): Secondary | ICD-10-CM

## 2019-08-05 NOTE — Therapy (Signed)
[facility] MAIN [facility] SERVICES [address] [place], [place], [address] Phone: [phone]   Fax:  [phone]  Physical Therapy Treatment  The patient has been informed of current processes in place at Outpatient Rehab to protect patients from Covid-19 exposure including social distancing, schedule modifications, and new cleaning procedures. After discussing their particular risk with a therapist based on the patient's personal risk factors, the patient has decided to proceed with in-person therapy.   Patient Details  Name: Christine Chavez MRN: [id number] Date of Birth: [birth year] Referring Provider (PT): [name]   Encounter Date: 08/05/2019  PT End of Session - 08/05/19 1143    Visit Number  31    Number of Visits  40    Date for PT Re-Evaluation  08/19/19    Authorization Type  [employer]    Authorization Time Period  from 05/25/2019 through 08/19/2019    Authorization - Visit Number  11    Authorization - Number of Visits  20    Progress Note Due on Visit  30    PT Start Time  1030    PT Stop Time  1130    PT Time Calculation (min)  60 min    Activity Tolerance  Patient tolerated treatment well;No increased pain    Behavior During Therapy  WFL for tasks assessed/performed       Past Medical History:  Diagnosis Date  . Complication of anesthesia    ? seizures after anesthesia   . Headache   . Migraines   . Neurogenic bladder   . Renal disorder   . Vision abnormalities     Past Surgical History:  Procedure Laterality Date  . ANTERIOR CRUCIATE LIGAMENT REPAIR  1997  . APPENDECTOMY    . COLONOSCOPY WITH PROPOFOL N/A 11/11/2018   Procedure: COLONOSCOPY WITH PROPOFOL;  Surgeon: Christena Deem, MD;  Location: [facility] ENDOSCOPY;  Service: Endoscopy;  Laterality: N/A;  . CYSTOSCOPY WITH STENT PLACEMENT Right 04/17/2016   Procedure: CYSTOSCOPY WITH STENT PLACEMENT;  Surgeon: Malen Gauze, MD;  Location: [facility] ORS;   Service: Urology;  Laterality: Right;  . ESOPHAGOGASTRODUODENOSCOPY (EGD) WITH PROPOFOL N/A 11/11/2018   Procedure: ESOPHAGOGASTRODUODENOSCOPY (EGD) WITH PROPOFOL;  Surgeon: Christena Deem, MD;  Location: [facility] ENDOSCOPY;  Service: Endoscopy;  Laterality: N/A;  . EXPLORATORY LAPAROTOMY  1999  . KIDNEY STONE SURGERY  04/2016  . REVISION UROSTOMY CUTANEOUS    . REVISION UROSTOMY CUTANEOUS  01/10/2018  . SUPRAPUBIC CATHETER PLACEMENT  08/2017  . TONSILLECTOMY      There were no vitals filed for this visit.  Pelvic Floor Physical Therapy Treatment Note  SCREENING  Changes in medications, allergies, or medical history?: Pain medicine Dr. Dewayne Hatch to do a pudendal nerve block.  SUBJECTIVE  Patient reports: Had another episode of increased pain on Saturday, had to take another opium belladona suppository which did help, still having strong smelling urine (like ammonia) and having "sloughing off" of bladder lining she believes when she catheterizes. What we did last week helped a lot with the stiffness and tension she was having. Started her period today, thinks some of her back pain is from this.  Precautions:  Neurological diagnosis pending, has "non-specific" brain lesions   Pain update:  Location of pain: B flank pain (pelvic area) Current pain:  4/10 (3/10, increases with motion) Max pain:  9/10 (9/10 when it spasms) Least pain:  2/10 (2/10) Nature of pain: sharp to dull ache  ** had some  increased pelvic spasms during and after treatment but overall pain the same. Noticed greater relaxation/ fascial mobility improvement on the R>L.  Patient Goals: Get rid of the discomfort with catheterizing and ache/bladder pain. Be able to take fewer medications and be able to enjoy spending time with her family again, doing "all the mom things"    OBJECTIVE  Changes in: Range of Motion/Flexibilty:  (from 12/10) Spine: ~ 2-3 fingers from knee with SB B. ~ 50% reduced R rotation with  stretch in L, ~ 75% reduced L rotation with increased tightness. Forward bend: 19 in.from floor. Hips: Unable to achieve neutral pelvis in long-sitting due to hamstring tightness.   Strength/MMT:  LE MMT (from 12-10) LE MMT Left Right  Hip flex:  (L2) /5 /5  Hip ext: 3+/5 3+/5  Hip abd: 4/5 4/5  Hip add: 4/5 3+/5  Hip IR 4/5 3+/5  Hip ER 4/5 4/5   (From 3/24): Pt. Demonstrates 3/5 strength with "I's, Y's, and T's" but 4/5 with W's.  Pelvic Floor External Exam: (From prior session)- [Introitus Appears: elevated Skin integrity: WNL Palpation: TTP to B STP Cough: limited motion Prolapse visible?: no Scar mobility: not assessed  Internal Vaginal Exam: Strength (PERF): Pt. Has difficulty sensing the PFM to generate contraction or relaxation due to high tone/neuro involvement Symmetry: L>R for spasm/TTP Palpation: TTP to all muscles throughout B Prolapse: none]  Palpation: TTP to B STP externally palpated with decreased fascial mobility through pudendal nerve innervation region surrounding the posterior fourchette and laterally.  Gait Analysis:  INTERVENTIONS THIS SESSION:  Manual: Attempted TP release to R STP with little decrease in TTP then Performed MFR B surrounding the posterior and lateral region near the posterior fourchette (near pudendal nerve supply) with MWM while Pt. Performs posterior pelvic tilts/relaxation to her tolerance to increase fascial mobility and see if we are able to decrease spasms even short-term.  Self-care: Educated on how pudendal nerve block varies from spinal puncture or Epidural to help ease Pt. Fear. Helped Pt. Understand difference between a nerve block vs. Ablation and what she might expect as far as changes in sexual function/arousal. Explained that sometimes just breaking the pain cycle long enough to allow for PT to make progress can be huge but that it will also help confirm or deny that it is the correct nerve if more permanent measures  need to be taken.  Total time: 60 min.                             PT Short Term Goals - 06/10/19 1116      PT SHORT TERM GOAL #1   Title  Patient will demonstrate coordinated diaphragmatic breathing with pelvic tilts to demonstrate improved control of diaphragm and TA, to allow for further strengthening of core musculature and decreased pelvic floor spasm.    Baseline  Pt. demonstrates breathing dysfunction and poor PFM coordination evidenced by anal manometry As of 2/22: Pt. continues to have restriction in her diaphragm and near T/L junction but is able to intentionally use diaphragmatic breathing through the available ROM.    Time  5    Period  Weeks    Status  Achieved    Target Date  04/20/19      PT SHORT TERM GOAL #2   Title  Patient will report a reduction in pain to no greater than 6/10 over the prior week to demonstrate symptom improvement.  Baseline  Pain is 10/10 at worst, 2/10 at best As of 2/22: Pt. had achieved this goal for 1 week as of 1/21 but may have infection or other insult that caused pain to increase again.    Time  5    Period  Weeks    Status  On-going    Target Date  04/20/19      PT SHORT TERM GOAL #3   Title  Patient will demonstrate HEP x1 in the clinic to demonstrate understanding and proper form to allow for further improvement.    Baseline  Pt. lacks knowledge of therepeutic exercises that can decrease her pain/Sx.    Time  5    Period  Weeks    Status  Achieved    Target Date  04/20/19      PT SHORT TERM GOAL #4   Title  Patient will report consistent use of foot-stool (squatty-potty) for positioning with BM to decrease pain with BM and intra-abdominal pressure.    Baseline  Pt. having constipation due to PFM dysfunction    Time  5    Period  Weeks    Status  Achieved    Target Date  04/20/19        PT Long Term Goals - 06/10/19 0001      PT LONG TERM GOAL #1   Title  Pt. will be able to participate in regular  ADL's with least restrictive device without pain increasing greater than 2/10    Baseline  Pt. limited in her ability to perform household duties by increased pain and fatigue.    Time  10    Period  Weeks    Status  On-going    Target Date  08/19/19      PT LONG TERM GOAL #2   Title  Patient will score at or below 65/300  on the PFDI and 35% on the Female NIH-CPSI to demonstrate a clinically meaningful decrease in disability and distress due to pelvic floor dysfunction.    Baseline  PFDI: 110/300, Female NIH-CPSI: 29/43 (67%)    Time  10    Period  Weeks    Status  Unable to assess    Target Date  08/19/19      PT LONG TERM GOAL #3   Title  Patient will report no pain with intercourse to demonstrate improved functional ability.    Baseline  Pt. Having significant pain with intercourse    Time  10    Period  Weeks    Status  On-going    Target Date  08/19/19      PT LONG TERM GOAL #4   Title  Pt will report ability to work in her yard for greater than 30 minutes without extreme fatigue    Baseline  limited to 10-15 minutes before pt requires ending activity    Time  10    Period  Weeks    Status  On-going    Target Date  08/19/19      PT LONG TERM GOAL #5   Title  Pt. will be able to go 2-3 hours between emptying her bladder without increased pain and empty her bladder fully whether by urostomy, cath, or voluntary release.    Baseline  Pt. has a urostomy but continues to have to self-catheterize. Has pain with bladder filling and when using catheter.    Time  10    Period  Weeks    Status  On-going  Target Date  08/19/19      PT LONG TERM GOAL #6   Title  Patient will report having BM's at least every-other day with consistency between [facility] stool scale 3-5 over the prior week to demonstrate decreased constipation.    Baseline  Pt. unable to have regula BM's without medication, manometry shows PFM dysfunction    Time  10    Period  Weeks    Status  On-going    Target  Date  08/19/19            Plan - 08/05/19 1142    Clinical Impression Statement  Pt. Responded well to all interventions today, demonstrating slight improvement in fascial mobility surrounding the pudendal nerve distribution, as well as improved understanding of what is involved with a pudendal nerve block and how it may assist in her PT progress today. They will continue to benefit from skilled physical therapy to work toward remaining goals and maximize function as well as decrease likelihood of symptom increase or recurrence.     PT Next Visit Plan  re-visit PFM/spasm reduction if no infection, ask about intentional rest, review I's Y's and T's in prone, thoracic mobility? perform further release and PNF for R>L shoulder blades, rotational mobilizations through T-L junction, more scar-release and cupping at suprapubic scar, MFR around B ilium. re-assess ROM/back pain. TPDN to multifidus through T-L  junction with E-stim at 40hz  and at 2 hz. continue to work on thoracolumbar rotation ROM and decreasing spasms, add SLS balance training, begin strengthening for reciprocal inhibition. Internal pelvic exam,    PT Home Exercise Plan  diaphragmatic breathing, seated posture (lumbar support and avoiding adduction, low back stretch, side-stretch, hamstring stretch, butterfly stretch, bow-and-arrow, TENS trial, squatty potty, bowel retraining, toileting tips, colonic massage, thoracic extensions and supine chest stretch with towel roll, hip EXT in prone with knee bent, side-lying hip ABD, child's pose., self internal TP release, I's, Y's, T's, and W's, self TP release to neck/shoulder area, limits of chronic pain/chronic fatigue and moderating activity level.    Consulted and Agree with Plan of Care  Patient       Patient will benefit from skilled therapeutic intervention in order to improve the following deficits and impairments:     Visit Diagnosis: Other muscle spasm  Muscle weakness  (generalized)  Difficulty in walking, not elsewhere classified     Problem List Patient Active Problem List   Diagnosis Date Noted  . Seizure (HCC) 11/11/2018  . Major depressive disorder, recurrent episode, moderate (HCC) 02/07/2018  . Nephrolithiasis 04/16/2016  . Numbness 07/28/2015  . Bladder retention 06/23/2015  . Abdominal pain 06/04/2015  . Dizziness 05/18/2015  . Neck pain 05/18/2015  . Complicated migraine 04/28/2015  . Other fatigue 04/28/2015  . Abnormal finding on MRI of brain 04/28/2015  . D (diarrhea) 03/29/2015  . H/O disease 03/29/2015  . Abnormal weight loss 03/29/2015  . Muscle weakness (generalized) 03/14/2015  . Headache, migraine 03/10/2015   03/12/2015 DPT, ATC Cleophus Molt 08/05/2019, 11:58 AM  [facility] MAIN [facility] SERVICES [address] [place], [place], [place] Phone: [phone]   Fax:  [phone]  Name: Christine Chavez MRN: [name] Date of Birth: 25-Oct-1980

## 2019-08-07 ENCOUNTER — Ambulatory Visit: Payer: BC Managed Care – PPO

## 2019-08-07 ENCOUNTER — Other Ambulatory Visit: Payer: Self-pay

## 2019-08-07 DIAGNOSIS — M6281 Muscle weakness (generalized): Secondary | ICD-10-CM

## 2019-08-07 DIAGNOSIS — R262 Difficulty in walking, not elsewhere classified: Secondary | ICD-10-CM

## 2019-08-07 DIAGNOSIS — M62838 Other muscle spasm: Secondary | ICD-10-CM | POA: Diagnosis not present

## 2019-08-07 NOTE — Therapy (Signed)
[facility] MAIN [facility] SERVICES [address] [place], [place], [address] Phone: [phone]   Fax:  [phone]  Physical Therapy Treatment  The patient has been informed of current processes in place at Outpatient Rehab to protect patients from Covid-19 exposure including social distancing, schedule modifications, and new cleaning procedures. After discussing their particular risk with a therapist based on the patient's personal risk factors, the patient has decided to proceed with in-person therapy.   Patient Details  Name: Christine Chavez MRN: [id number] Date of Birth: Oct 30, 1980 Referring Provider (PT): [name]   Encounter Date: 08/07/2019  PT End of Session - 08/07/19 0925    Visit Number  32    Number of Visits  40    Date for PT Re-Evaluation  08/19/19    Authorization Type  [employer]    Authorization Time Period  from 05/25/2019 through 08/19/2019    Authorization - Visit Number  12    Authorization - Number of Visits  20    Progress Note Due on Visit  30    PT Start Time  0900    PT Stop Time  1000    PT Time Calculation (min)  60 min    Activity Tolerance  Patient tolerated treatment well;No increased pain    Behavior During Therapy  WFL for tasks assessed/performed       Past Medical History:  Diagnosis Date  . Complication of anesthesia    ? seizures after anesthesia   . Headache   . Migraines   . Neurogenic bladder   . Renal disorder   . Vision abnormalities     Past Surgical History:  Procedure Laterality Date  . ANTERIOR CRUCIATE LIGAMENT REPAIR  1997  . APPENDECTOMY    . COLONOSCOPY WITH PROPOFOL N/A 11/11/2018   Procedure: COLONOSCOPY WITH PROPOFOL;  Surgeon: Christena Deem, MD;  Location: [facility] ENDOSCOPY;  Service: Endoscopy;  Laterality: N/A;  . CYSTOSCOPY WITH STENT PLACEMENT Right 04/17/2016   Procedure: CYSTOSCOPY WITH STENT PLACEMENT;  Surgeon: Malen Gauze, MD;  Location: [facility] ORS;   Service: Urology;  Laterality: Right;  . ESOPHAGOGASTRODUODENOSCOPY (EGD) WITH PROPOFOL N/A 11/11/2018   Procedure: ESOPHAGOGASTRODUODENOSCOPY (EGD) WITH PROPOFOL;  Surgeon: Christena Deem, MD;  Location: [facility] ENDOSCOPY;  Service: Endoscopy;  Laterality: N/A;  . EXPLORATORY LAPAROTOMY  1999  . KIDNEY STONE SURGERY  04/2016  . REVISION UROSTOMY CUTANEOUS    . REVISION UROSTOMY CUTANEOUS  01/10/2018  . SUPRAPUBIC CATHETER PLACEMENT  08/2017  . TONSILLECTOMY      There were no vitals filed for this visit.   Pelvic Floor Physical Therapy Treatment Note  SCREENING  Changes in medications, allergies, or medical history?: Pain medicine Dr. Dewayne Hatch to do a pudendal nerve block.  SUBJECTIVE  Patient reports: Spasms were really flared Tuesday, less bad yesterday, not terrible yet today but it is early still. Not sure if it was a result of PT or not, has been having worse flank pain too, is far enough into her period that she does not think much of it is from that now.  Precautions:  Neurological diagnosis pending, has "non-specific" brain lesions   Pain update:  Location of pain: B flank pain (pelvic area) Current pain:  4/10 (3/10, increases with motion) Max pain:  9/10 (9/10 when it spasms) Least pain:  2/10 (2/10) Nature of pain: sharp to dull ache  ** no increased pelvic spasms following treatment.   Patient Goals: Get rid of the  discomfort with catheterizing and ache/bladder pain. Be able to take fewer medications and be able to enjoy spending time with her family again, doing "all the mom things"    OBJECTIVE  Changes in: Range of Motion/Flexibilty:  (from 12/10) Spine: ~ 2-3 fingers from knee with SB B. ~ 50% reduced R rotation with stretch in L, ~ 75% reduced L rotation with increased tightness. Forward bend: 19 in.from floor. Hips: Unable to achieve neutral pelvis in long-sitting due to hamstring tightness.   Strength/MMT:  LE MMT (from 12-10) LE MMT Left Right   Hip flex:  (L2) /5 /5  Hip ext: 3+/5 3+/5  Hip abd: 4/5 4/5  Hip add: 4/5 3+/5  Hip IR 4/5 3+/5  Hip ER 4/5 4/5   (From 3/24): Pt. Demonstrates 3/5 strength with "I's, Y's, and T's" but 4/5 with W's.  Pelvic Floor External Exam: (From prior session)- [Introitus Appears: elevated Skin integrity: WNL Palpation: TTP to B STP Cough: limited motion Prolapse visible?: no Scar mobility: not assessed  Internal Vaginal Exam: Strength (PERF): Pt. Has difficulty sensing the PFM to generate contraction or relaxation due to high tone/neuro involvement Symmetry: L>R for spasm/TTP Palpation: TTP to all muscles throughout B Prolapse: none]  Palpation: TTP to B multifidus at T-L junction and B glte Med and max   Gait Analysis:  INTERVENTIONS THIS SESSION:  Manual: Performed TP release and STM  to B multifidus at T-L junction and B glute Med and max to decrease spasm and pain and allow for improved balance of musculature for improved function and decreased symptoms.  E-stim: Performed e-stim with TPDN to TL- junction and B glute med/min across the spine on separate channels at a frequency of 100 and strong-tingle intensity to help decrease nerve sensitivity, down-regulate the muscles and decrease pain and spasm.  Dry-needle: Performed TPDN with a .30x30mm needle and standard approach as described below to decrease spasm and pain and allow for improved balance of musculature for improved function and decreased symptoms.  Total time: 60 min.                   Trigger Point Dry Needling - 08/07/19 0001    Consent Given?  Yes    Education Handout Provided  No    Muscles Treated Back/Hip  Gluteus maximus;Erector spinae;Lumbar multifidi;Thoracic multifidi    Dry Needling Comments  B    Electrical Stimulation Performed with Dry Needling  Yes    E-stim with Dry Needling Details  Frequency 100, ~ T12 and S2    Gluteus Maximus Response  Twitch response elicited;Palpable  increased muscle length    Erector spinae Response  Twitch response elicited;Palpable increased muscle length    Lumbar multifidi Response  Twitch response elicited;Palpable increased muscle length    Thoracic multifidi response  Twitch response elicited;Palpable increased muscle length             PT Short Term Goals - 06/10/19 1116      PT SHORT TERM GOAL #1   Title  Patient will demonstrate coordinated diaphragmatic breathing with pelvic tilts to demonstrate improved control of diaphragm and TA, to allow for further strengthening of core musculature and decreased pelvic floor spasm.    Baseline  Pt. demonstrates breathing dysfunction and poor PFM coordination evidenced by anal manometry As of 2/22: Pt. continues to have restriction in her diaphragm and near T/L junction but is able to intentionally use diaphragmatic breathing through the available ROM.    Time  5  Period  Weeks    Status  Achieved    Target Date  04/20/19      PT SHORT TERM GOAL #2   Title  Patient will report a reduction in pain to no greater than 6/10 over the prior week to demonstrate symptom improvement.    Baseline  Pain is 10/10 at worst, 2/10 at best As of 2/22: Pt. had achieved this goal for 1 week as of 1/21 but may have infection or other insult that caused pain to increase again.    Time  5    Period  Weeks    Status  On-going    Target Date  04/20/19      PT SHORT TERM GOAL #3   Title  Patient will demonstrate HEP x1 in the clinic to demonstrate understanding and proper form to allow for further improvement.    Baseline  Pt. lacks knowledge of therepeutic exercises that can decrease her pain/Sx.    Time  5    Period  Weeks    Status  Achieved    Target Date  04/20/19      PT SHORT TERM GOAL #4   Title  Patient will report consistent use of foot-stool (squatty-potty) for positioning with BM to decrease pain with BM and intra-abdominal pressure.    Baseline  Pt. having constipation due to PFM  dysfunction    Time  5    Period  Weeks    Status  Achieved    Target Date  04/20/19        PT Long Term Goals - 06/10/19 0001      PT LONG TERM GOAL #1   Title  Pt. will be able to participate in regular ADL's with least restrictive device without pain increasing greater than 2/10    Baseline  Pt. limited in her ability to perform household duties by increased pain and fatigue.    Time  10    Period  Weeks    Status  On-going    Target Date  08/19/19      PT LONG TERM GOAL #2   Title  Patient will score at or below 65/300  on the PFDI and 35% on the Female NIH-CPSI to demonstrate a clinically meaningful decrease in disability and distress due to pelvic floor dysfunction.    Baseline  PFDI: 110/300, Female NIH-CPSI: 29/43 (67%)    Time  10    Period  Weeks    Status  Unable to assess    Target Date  08/19/19      PT LONG TERM GOAL #3   Title  Patient will report no pain with intercourse to demonstrate improved functional ability.    Baseline  Pt. Having significant pain with intercourse    Time  10    Period  Weeks    Status  On-going    Target Date  08/19/19      PT LONG TERM GOAL #4   Title  Pt will report ability to work in her yard for greater than 30 minutes without extreme fatigue    Baseline  limited to 10-15 minutes before pt requires ending activity    Time  10    Period  Weeks    Status  On-going    Target Date  08/19/19      PT LONG TERM GOAL #5   Title  Pt. will be able to go 2-3 hours between emptying her bladder without increased pain and empty her  bladder fully whether by urostomy, cath, or voluntary release.    Baseline  Pt. has a urostomy but continues to have to self-catheterize. Has pain with bladder filling and when using catheter.    Time  10    Period  Weeks    Status  On-going    Target Date  08/19/19      PT LONG TERM GOAL #6   Title  Patient will report having BM's at least every-other day with consistency between [facility] stool scale 3-5  over the prior week to demonstrate decreased constipation.    Baseline  Pt. unable to have regula BM's without medication, manometry shows PFM dysfunction    Time  10    Period  Weeks    Status  On-going    Target Date  08/19/19            Plan - 08/07/19 1750    Clinical Impression Statement  Pt. Responded well to all interventions today, demonstrating decreased TTP and spasms as well as understanding and correct performance of all education and exercises provided today. They will continue to benefit from skilled physical therapy to work toward remaining goals and maximize function as well as decrease likelihood of symptom increase or recurrence.     PT Next Visit Plan  try TPDN with needle re-visit PFM/spasm reduction if no infection, ask about intentional rest, review I's Y's and T's in prone, thoracic mobility? perform further release and PNF for R>L shoulder blades, rotational mobilizations through T-L junction, more scar-release and cupping at suprapubic scar, MFR around B ilium. re-assess ROM/back pain. TPDN to multifidus through T-L  junction with E-stim at 40hz  and at 2 hz. continue to work on thoracolumbar rotation ROM and decreasing spasms, add SLS balance training, begin strengthening for reciprocal inhibition. Internal pelvic exam,    PT Home Exercise Plan  diaphragmatic breathing, seated posture (lumbar support and avoiding adduction, low back stretch, side-stretch, hamstring stretch, butterfly stretch, bow-and-arrow, TENS trial, squatty potty, bowel retraining, toileting tips, colonic massage, thoracic extensions and supine chest stretch with towel roll, hip EXT in prone with knee bent, side-lying hip ABD, child's pose., self internal TP release, I's, Y's, T's, and W's, self TP release to neck/shoulder area, limits of chronic pain/chronic fatigue and moderating activity level.    Consulted and Agree with Plan of Care  Patient       Patient will benefit from skilled therapeutic  intervention in order to improve the following deficits and impairments:     Visit Diagnosis: Other muscle spasm  Muscle weakness (generalized)  Difficulty in walking, not elsewhere classified     Problem List Patient Active Problem List   Diagnosis Date Noted  . Seizure (HCC) 11/11/2018  . Major depressive disorder, recurrent episode, moderate (HCC) 02/07/2018  . Nephrolithiasis 04/16/2016  . Numbness 07/28/2015  . Bladder retention 06/23/2015  . Abdominal pain 06/04/2015  . Dizziness 05/18/2015  . Neck pain 05/18/2015  . Complicated migraine 04/28/2015  . Other fatigue 04/28/2015  . Abnormal finding on MRI of brain 04/28/2015  . D (diarrhea) 03/29/2015  . H/O disease 03/29/2015  . Abnormal weight loss 03/29/2015  . Muscle weakness (generalized) 03/14/2015  . Headache, migraine 03/10/2015   03/12/2015 DPT, ATC Cleophus Molt 08/07/2019, 6:21 PM  [facility] MAIN [facility] SERVICES [address] [place], [place], [place] Phone: [phone]   Fax:  [phone]  Name: Analya Louissaint MRN: [name] Date of Birth: Sep 20, 1980

## 2019-08-12 ENCOUNTER — Ambulatory Visit: Payer: BC Managed Care – PPO

## 2019-08-14 ENCOUNTER — Other Ambulatory Visit: Payer: Self-pay

## 2019-08-14 ENCOUNTER — Ambulatory Visit: Payer: BC Managed Care – PPO

## 2019-08-14 DIAGNOSIS — M62838 Other muscle spasm: Secondary | ICD-10-CM | POA: Diagnosis not present

## 2019-08-14 DIAGNOSIS — M6281 Muscle weakness (generalized): Secondary | ICD-10-CM

## 2019-08-14 DIAGNOSIS — R262 Difficulty in walking, not elsewhere classified: Secondary | ICD-10-CM

## 2019-08-14 NOTE — Therapy (Signed)
[facility] MAIN [facility] SERVICES [address] [place], [place], [address] Phone: [phone]   Fax:  [phone]  Physical Therapy Treatment  The patient has been informed of current processes in place at Outpatient Rehab to protect patients from Covid-19 exposure including social distancing, schedule modifications, and new cleaning procedures. After discussing their particular risk with a therapist based on the patient's personal risk factors, the patient has decided to proceed with in-person therapy.   Patient Details  Name: Christine Chavez MRN: [id number] Date of Birth: November 08, 1980 Referring Provider (PT): [name]   Encounter Date: 08/14/2019  PT End of Session - 08/14/19 1316    Visit Number  33    Number of Visits  40    Date for PT Re-Evaluation  08/19/19    Authorization Type  [employer]    Authorization Time Period  from 05/25/2019 through 08/19/2019    Authorization - Visit Number  13    Authorization - Number of Visits  20    Progress Note Due on Visit  30    PT Start Time  1000    PT Stop Time  1100    PT Time Calculation (min)  60 min    Activity Tolerance  Patient tolerated treatment well;No increased pain    Behavior During Therapy  WFL for tasks assessed/performed       Past Medical History:  Diagnosis Date  . Complication of anesthesia    ? seizures after anesthesia   . Headache   . Migraines   . Neurogenic bladder   . Renal disorder   . Vision abnormalities     Past Surgical History:  Procedure Laterality Date  . ANTERIOR CRUCIATE LIGAMENT REPAIR  1997  . APPENDECTOMY    . COLONOSCOPY WITH PROPOFOL N/A 11/11/2018   Procedure: COLONOSCOPY WITH PROPOFOL;  Surgeon: Christena Deem, MD;  Location: [facility] ENDOSCOPY;  Service: Endoscopy;  Laterality: N/A;  . CYSTOSCOPY WITH STENT PLACEMENT Right 04/17/2016   Procedure: CYSTOSCOPY WITH STENT PLACEMENT;  Surgeon: Malen Gauze, MD;  Location: [facility] ORS;   Service: Urology;  Laterality: Right;  . ESOPHAGOGASTRODUODENOSCOPY (EGD) WITH PROPOFOL N/A 11/11/2018   Procedure: ESOPHAGOGASTRODUODENOSCOPY (EGD) WITH PROPOFOL;  Surgeon: Christena Deem, MD;  Location: [facility] ENDOSCOPY;  Service: Endoscopy;  Laterality: N/A;  . EXPLORATORY LAPAROTOMY  1999  . KIDNEY STONE SURGERY  04/2016  . REVISION UROSTOMY CUTANEOUS    . REVISION UROSTOMY CUTANEOUS  01/10/2018  . SUPRAPUBIC CATHETER PLACEMENT  08/2017  . TONSILLECTOMY      There were no vitals filed for this visit.  Pelvic Floor Physical Therapy Treatment Note  SCREENING  Changes in medications, allergies, or medical history?: pudendal nerve block scheduled for Wed. May 5th.  SUBJECTIVE  Patient reports: Sunday was rough with bladder spasms, was feeling the effects of over-doing it a little on her trip. Monday was a little better but then Tuesday she felt bad with nausea and her stomach was "out of wack" as well as increased flank pain. The daily antibiotic seems to be keeping things down but she is not sure if this is really the pregabalin and baclofen Hiding the Sx.   Was "doing" for ~ 2 hours and then sitting for a few minutes. But not really allowing herself to rest, started fiddling with stuff.   Precautions:  Neurological diagnosis pending, has "non-specific" brain lesions   Pain update:  Location of pain: B flank pain (pelvic area) Current pain:  3/10 (  3/10, increases with motion) Max pain:  8/10 (8/10 when it spasms) Least pain:  2/10 (2/10) Nature of pain: sharp to dull ache  ** no increased pelvic spasms following treatment.   Patient Goals: Get rid of the discomfort with catheterizing and ache/bladder pain. Be able to take fewer medications and be able to enjoy spending time with her family again, doing "all the mom things"    OBJECTIVE  Changes in: Range of Motion/Flexibilty:  (from 12/10) Spine: ~ 2-3 fingers from knee with SB B. ~ 50% reduced R rotation with  stretch in L, ~ 75% reduced L rotation with increased tightness. Forward bend: 19 in.from floor. Hips: Unable to achieve neutral pelvis in long-sitting due to hamstring tightness.   Strength/MMT:  LE MMT (from 12-10) LE MMT Left Right  Hip flex:  (L2) /5 /5  Hip ext: 3+/5 3+/5  Hip abd: 4/5 4/5  Hip add: 4/5 3+/5  Hip IR 4/5 3+/5  Hip ER 4/5 4/5   (From 3/24): Pt. Demonstrates 3/5 strength with "I's, Y's, and T's" but 4/5 with W's.  Pelvic Floor External Exam: (From prior session)- [Introitus Appears: elevated Skin integrity: WNL Palpation: TTP to B STP Cough: limited motion Prolapse visible?: no Scar mobility: not assessed  Internal Vaginal Exam: Strength (PERF): Pt. Has difficulty sensing the PFM to generate contraction or relaxation due to high tone/neuro involvement Symmetry: L>R for spasm/TTP Palpation: TTP to all muscles throughout B Prolapse: none]  Palpation:  Gait Analysis:  INTERVENTIONS THIS SESSION:   Self-care: Re-assessed goals and made new POC.   Theract: Reviewed pain-science and need to keep a pain/fatigue diary to help us improve her endurance without over-doing it and getting a rebound in Sx. Reinforced self-care and discussed how to increase the responsibility of daily chores to her older children to shift the stress on her and allow her to rest more.   Total time: 60 min.                            PT Short Term Goals - 08/14/19 1031      PT SHORT TERM GOAL #1   Title  Patient will demonstrate coordinated diaphragmatic breathing with pelvic tilts to demonstrate improved control of diaphragm and TA, to allow for further strengthening of core musculature and decreased pelvic floor spasm.    Baseline  Pt. demonstrates breathing dysfunction and poor PFM coordination evidenced by anal manometry As of 2/22: Pt. continues to have restriction in her diaphragm and near T/L junction but is able to intentionally use diaphragmatic  breathing through the available ROM.    Time  5    Period  Weeks    Status  Achieved    Target Date  04/20/19      PT SHORT TERM GOAL #2   Title  Patient will report a reduction in pain to no greater than 6/10 over the prior week to demonstrate symptom improvement.    Baseline  Pain is 10/10 at worst, 2/10 at best As of 2/22: Pt. had achieved this goal for 1 week as of 1/21 but may have infection or other insult that caused pain to increase again. As of 4/29: Pain high of 8/10 but has been over-doing her activity over the past week.    Time  5    Period  Weeks    Status  On-going    Target Date  10/23/19      PT SHORT  TERM GOAL #3   Title  Patient will demonstrate HEP x1 in the clinic to demonstrate understanding and proper form to allow for further improvement.    Baseline  Pt. lacks knowledge of therepeutic exercises that can decrease her pain/Sx.    Time  5    Period  Weeks    Status  Achieved    Target Date  04/20/19      PT SHORT TERM GOAL #4   Title  Patient will report consistent use of foot-stool (squatty-potty) for positioning with BM to decrease pain with BM and intra-abdominal pressure.    Baseline  Pt. having constipation due to PFM dysfunction    Time  5    Period  Weeks    Status  Achieved    Target Date  04/20/19        PT Long Term Goals - 08/14/19 0001      PT LONG TERM GOAL #1   Title  Pt. will be able to participate in regular ADL's with least restrictive device without pain increasing greater than 2/10    Baseline  Pt. limited in her ability to perform household duties by increased pain and fatigue. As of 4/29: Her legs have more endurance with walking before she gets a tremor. Still needs to sit down the majority of the time she is cooking. Still needs back support for prolonged sitting.    Time  10    Period  Weeks    Status  On-going    Target Date  10/23/19      PT LONG TERM GOAL #2   Title  Patient will score at or below 65/300  on the PFDI and 35%  on the Female NIH-CPSI to demonstrate a clinically meaningful decrease in disability and distress due to pelvic floor dysfunction.    Baseline  PFDI: 110/300, Female NIH-CPSI: 29/43 (67%) As of 4/29: 100/300 on PFDI and 30/43 on NIH-CPSI    Time  10    Period  Weeks    Status  Unable to assess    Target Date  10/23/19      PT LONG TERM GOAL #3   Title  Patient will report no pain with intercourse to demonstrate improved functional ability.    Baseline  Pt. Having significant pain with intercourse. As of 4/29: is a little better (20-30%) but is worse on days where overall tension/pain is worse.    Time  10    Period  Weeks    Status  On-going    Target Date  10/23/19      PT LONG TERM GOAL #4   Title  Pt will report ability to work in her yard for greater than 30 minutes without extreme fatigue    Baseline  limited to 10-15 minutes before pt requires ending activity. As of 4/29: ~ 20 min.    Time  10    Period  Weeks    Status  On-going    Target Date  10/23/19      PT LONG TERM GOAL #5   Title  Pt. will be able to go 2-3 hours between emptying her bladder without increased pain and empty her bladder fully whether by urostomy, cath, or voluntary release.    Baseline  Pt. has a urostomy but continues to have to self-catheterize. Has pain with bladder filling and when using catheter. As of 4/29: is doing better since starting to take the pregabalin andd baclofen but occasionally still has pain with  bladder filling. It is bad when she has an infection or over-does activity, better on "normal" days which have been few and far between due to frequent UTI's    Time  10    Period  Weeks    Status  On-going    Target Date  10/23/19      PT LONG TERM GOAL #6   Title  Patient will report having BM's at least every-other day with consistency between [facility] stool scale 3-5 over the prior week to demonstrate decreased constipation.    Baseline  Pt. unable to have regula BM's without medication,  manometry shows PFM dysfunction. As of 4/29: still having to use and enema or supository occasionally and still needing linzess     Time  10    Period  Weeks    Status  On-going    Target Date  10/23/19            Plan - 08/14/19 1319    Clinical Impression Statement  Pt. has been responding slowly but surely to treatment. She has been struggling with recurrent UTI's and an undiagnosed neurological insult that are inhibiting progress but she will be undergoing a pudendal nerve block early next week and this may help stop the pain/spasm/UTI cycle to allow Pt. to participate more fully with PT and be in less pain. When she is not having a "flare up" from a UTI or over-doing-it with activity she is seeing progress in her pain and mobility since starting PT but still has a long way to go to reach her original goals. We will contiue at 2x/week for 10 more weeks and renew our focus on decreasing PFM spasms/pain once nerve block is done as well as increase core-strength and balance and emphasize a graded approach to activity using a chronic-pain model to slowly increase tolerance to activity and reduce pain spikes and valleys from current activity pattern.    Personal Factors and Comorbidities  Comorbidity 3+    Comorbidities  neurologic insult with pending diagnosis, multiple recurrent UTI's, BLE weakness, depression    Examination-Activity Limitations  Caring for Others;Carry;Continence;Squat;Stairs;Toileting;Locomotion Level;Lift;Stand    Examination-Participation Restrictions  Church;Cleaning;Community Activity;Driving;Interpersonal Relationship;Laundry;Yard Work;Meal Prep    Stability/Clinical Decision Making  Unstable/Unpredictable    Clinical Decision Making  High    Rehab Potential  Fair    PT Frequency  2x / week    PT Treatment/Interventions  ADLs/Self Care Home Management;Aquatic Therapy;Biofeedback;Moist Heat;Electrical Stimulation;Cryotherapy;Traction;Ultrasound;Therapeutic  activities;Functional mobility training;Stair training;Gait training;Therapeutic exercise;Balance training;Neuromuscular re-education;Patient/family education;Manual techniques;Dry needling;Scar mobilization;Energy conservation;Taping;Joint Manipulations;Spinal Manipulations;Visual/perceptual remediation/compensation;Passive range of motion    PT Next Visit Plan  focus on graded activity, PFM TP release and seated and standing balance/core strength. TPDN with tens at T/L junction review I's Y's and T's in prone, thoracic mobility? perform further release and PNF for R>L shoulder blades, rotational mobilizations through T-L junction, more scar-release and cupping at suprapubic scar, MFR around B ilium. re-assess ROM/back pain. TPDN to multifidus through T-L  junction with E-stim at 40hz  and at 2 hz. continue to work on thoracolumbar rotation ROM and decreasing spasms, add SLS balance training, begin strengthening for reciprocal inhibition. Internal pelvic exam,    PT Home Exercise Plan  diaphragmatic breathing, seated posture (lumbar support and avoiding adduction, low back stretch, side-stretch, hamstring stretch, butterfly stretch, bow-and-arrow, TENS trial, squatty potty, bowel retraining, toileting tips, colonic massage, thoracic extensions and supine chest stretch with towel roll, hip EXT in prone with knee bent, side-lying hip ABD, child's pose., self internal  TP release, I's, Y's, T's, and W's, self TP release to neck/shoulder area, limits of chronic pain/chronic fatigue and moderating activity level, pain/fatigue diary.    Consulted and Agree with Plan of Care  Patient       Patient will benefit from skilled therapeutic intervention in order to improve the following deficits and impairments:  Abnormal gait, Decreased balance, Decreased endurance, Decreased mobility, Difficulty walking, Increased muscle spasms, Impaired sensation, Improper body mechanics, Impaired tone, Decreased activity tolerance,  Decreased coordination, Decreased strength, Postural dysfunction, Pain  Visit Diagnosis: Other muscle spasm  Muscle weakness (generalized)  Difficulty in walking, not elsewhere classified     Problem List Patient Active Problem List   Diagnosis Date Noted  . Seizure (HCC) 11/11/2018  . Major depressive disorder, recurrent episode, moderate (HCC) 02/07/2018  . Nephrolithiasis 04/16/2016  . Numbness 07/28/2015  . Bladder retention 06/23/2015  . Abdominal pain 06/04/2015  . Dizziness 05/18/2015  . Neck pain 05/18/2015  . Complicated migraine 04/28/2015  . Other fatigue 04/28/2015  . Abnormal finding on MRI of brain 04/28/2015  . D (diarrhea) 03/29/2015  . H/O disease 03/29/2015  . Abnormal weight loss 03/29/2015  . Muscle weakness (generalized) 03/14/2015  . Headache, migraine 03/10/2015   Cleophus Molt DPT, ATC Cleophus Molt 08/14/2019, 1:30 PM  [facility] MAIN [facility] SERVICES [address] [place], [place], [address] Phone: [phone]   Fax:  [phone]  Name: Christine Chavez MRN: [id number] Date of Birth: [birth year]

## 2019-08-15 ENCOUNTER — Ambulatory Visit: Payer: BC Managed Care – PPO | Admitting: Psychiatry

## 2019-08-19 ENCOUNTER — Other Ambulatory Visit: Payer: Self-pay

## 2019-08-19 ENCOUNTER — Ambulatory Visit: Payer: BC Managed Care – PPO | Attending: Gastroenterology

## 2019-08-19 DIAGNOSIS — M62838 Other muscle spasm: Secondary | ICD-10-CM | POA: Insufficient documentation

## 2019-08-19 DIAGNOSIS — R262 Difficulty in walking, not elsewhere classified: Secondary | ICD-10-CM | POA: Insufficient documentation

## 2019-08-19 DIAGNOSIS — M6281 Muscle weakness (generalized): Secondary | ICD-10-CM | POA: Insufficient documentation

## 2019-08-19 NOTE — Therapy (Signed)
[place] MAIN [facility] SERVICES [address] [place], [place], [address] Phone: [phone]   Fax:  [phone]  Physical Therapy Treatment  The patient has been informed of current processes in place at Outpatient Rehab to protect patients from Covid-19 exposure including social distancing, schedule modifications, and new cleaning procedures. After discussing their particular risk with a therapist based on the patient's personal risk factors, the patient has decided to proceed with in-person therapy.   Patient Details  Name: Christine Chavez MRN: [id number] Date of Birth: 1980-08-09 Referring Provider (PT): [name]   Encounter Date: 08/19/2019  PT End of Session - 08/19/19 1151    Visit Number  34    Number of Visits  55    Date for PT Re-Evaluation  10/23/19    Authorization Type  [employer]    Authorization Time Period  from 08/14/2019 through 10/23/2019    Authorization - Visit Number  2    Authorization - Number of Visits  20    Progress Note Due on Visit  50    PT Start Time  7124    PT Stop Time  1135    PT Time Calculation (min)  60 min    Activity Tolerance  Patient tolerated treatment well;No increased pain    Behavior During Therapy  WFL for tasks assessed/performed       Past Medical History:  Diagnosis Date  . Complication of anesthesia    ? seizures after anesthesia   . Headache   . Migraines   . Neurogenic bladder   . Renal disorder   . Vision abnormalities     Past Surgical History:  Procedure Laterality Date  . ANTERIOR CRUCIATE LIGAMENT REPAIR  1997  . APPENDECTOMY    . COLONOSCOPY WITH PROPOFOL N/A 11/11/2018   Procedure: COLONOSCOPY WITH PROPOFOL;  Surgeon: [name], MD;  Location: [facility] ENDOSCOPY;  Service: Endoscopy;  Laterality: N/A;  . CYSTOSCOPY WITH STENT PLACEMENT Right 04/17/2016   Procedure: CYSTOSCOPY WITH STENT PLACEMENT;  Surgeon: [name], MD;  Location: [facility] ORS;   Service: Urology;  Laterality: Right;  . ESOPHAGOGASTRODUODENOSCOPY (EGD) WITH PROPOFOL N/A 11/11/2018   Procedure: ESOPHAGOGASTRODUODENOSCOPY (EGD) WITH PROPOFOL;  Surgeon: [name], MD;  Location: [facility] ENDOSCOPY;  Service: Endoscopy;  Laterality: N/A;  . EXPLORATORY LAPAROTOMY  1999  . KIDNEY STONE SURGERY  04/2016  . REVISION UROSTOMY CUTANEOUS    . REVISION UROSTOMY CUTANEOUS  01/10/2018  . SUPRAPUBIC CATHETER PLACEMENT  08/2017  . TONSILLECTOMY      There were no vitals filed for this visit.    Pelvic Floor Physical Therapy Treatment Note  SCREENING  Changes in medications, allergies, or medical history?: pudendal nerve block scheduled for Wed. May 5th.  SUBJECTIVE  Patient reports: Yesterday she woke up at ~ 3:30 in the morning with terrible bladder spasms. Has messaged Urology. Took an oxybutynin which did not help. Used the opium belladonna and it helped some. Still feels this morning like she needs to take another oxybutynin. Got a wave of nausea this morning too, is afraid that getting the nerve block would mask the infection if there was one. Something has got the bladder spasms flared up. Spasms had been mostly under control since starting baclofen but it seems like she has been having more more frequently and with no other explanation.   Precautions:  Neurological diagnosis pending, has "non-specific" brain lesions   Pain update:  Location of pain: B flank pain (  pelvic area) Current pain:  3-4/10 (6/10, increases with motion) Max pain:  5/10 (9/10 when it spasms) Least pain:  2/10 (2/10) Nature of pain: sharp to dull ache  ** no increased pelvic spasms following treatment. Pt. Reports feeling looser/less tension in the neck/shoulders following treatment.  Patient Goals: Get rid of the discomfort with catheterizing and ache/bladder pain. Be able to take fewer medications and be able to enjoy spending time with her family again, doing "all the mom things"     OBJECTIVE  Changes in: Range of Motion/Flexibilty:  (from 12/10) Spine: ~ 2-3 fingers from knee with SB B. ~ 50% reduced R rotation with stretch in L, ~ 75% reduced L rotation with increased tightness. Forward bend: 19 in.from floor. Hips: Unable to achieve neutral pelvis in long-sitting due to hamstring tightness.   Strength/MMT:  LE MMT (from 12-10) LE MMT Left Right  Hip flex:  (L2) /5 /5  Hip ext: 3+/5 3+/5  Hip abd: 4/5 4/5  Hip add: 4/5 3+/5  Hip IR 4/5 3+/5  Hip ER 4/5 4/5   (From 3/24): Pt. Demonstrates 3/5 strength with "I's, Y's, and T's" but 4/5 with W's.  Balance/stability: Pt. Able to maintain upright posture with no UE support and slow deliberate perturbations for ~ 2 min with increasing stability and 1 min. With increased speed of direction change though greater variation from the center occurred and it took longer for correction and improved by ~ 50% over the time performed.  Pelvic Floor External Exam: (From prior session)- [Introitus Appears: elevated Skin integrity: WNL Palpation: TTP to B STP Cough: limited motion Prolapse visible?: no Scar mobility: not assessed  Internal Vaginal Exam: Strength (PERF): Pt. Has difficulty sensing the PFM to generate contraction or relaxation due to high tone/neuro involvement Symmetry: L>R for spasm/TTP Palpation: TTP to all muscles throughout B Prolapse: none]  Palpation: TTP to B upper traps. Decreased fascial mobility in cephalic direction along L>R paraspinals.  Gait Analysis:  INTERVENTIONS THIS SESSION:  Manual: Performed TP release to B upper trapezius followed by grade 2-4 PA mobs from C7-T4 and superior/inferion fascial glides along upper thoracic paraspinals to decrease spasm and pain and allow for improved balance of musculature for improved function and decreased symptoms. . Dry-needle: Performed TPDN with 4 .30x6960mm needles and standard approach and E-stim as described below to decrease spasm and  pain and allow for improved balance of musculature for improved function and decreased symptoms.  NM re-ed: educated on and practiced seated posture endurance training with/without perturbations, allowing sufficient time to rest in-between.    Total time: 60 min.                     Trigger Point Dry Needling - 08/19/19 0001    Consent Given?  Yes    Education Handout Provided  No    Muscles Treated Back/Hip  Erector spinae;Lumbar multifidi;Thoracic multifidi    Dry Needling Comments  B at ~ T12 and L3    Electrical Stimulation Performed with Dry Needling  Yes    E-stim with Dry Needling Details  Freqency of 2. muscle contraction    Erector spinae Response  Twitch response elicited;Palpable increased muscle length    Lumbar multifidi Response  Twitch response elicited;Palpable increased muscle length    Thoracic multifidi response  Twitch response elicited;Palpable increased muscle length             PT Short Term Goals - 08/14/19 1031  PT SHORT TERM GOAL #1   Title  Patient will demonstrate coordinated diaphragmatic breathing with pelvic tilts to demonstrate improved control of diaphragm and TA, to allow for further strengthening of core musculature and decreased pelvic floor spasm.    Baseline  Pt. demonstrates breathing dysfunction and poor PFM coordination evidenced by anal manometry As of 2/22: Pt. continues to have restriction in her diaphragm and near T/L junction but is able to intentionally use diaphragmatic breathing through the available ROM.    Time  5    Period  Weeks    Status  Achieved    Target Date  04/20/19      PT SHORT TERM GOAL #2   Title  Patient will report a reduction in pain to no greater than 6/10 over the prior week to demonstrate symptom improvement.    Baseline  Pain is 10/10 at worst, 2/10 at best As of 2/22: Pt. had achieved this goal for 1 week as of 1/21 but may have infection or other insult that caused pain to increase  again. As of 4/29: Pain high of 8/10 but has been over-doing her activity over the past week.    Time  5    Period  Weeks    Status  On-going    Target Date  10/23/19      PT SHORT TERM GOAL #3   Title  Patient will demonstrate HEP x1 in the clinic to demonstrate understanding and proper form to allow for further improvement.    Baseline  Pt. lacks knowledge of therepeutic exercises that can decrease her pain/Sx.    Time  5    Period  Weeks    Status  Achieved    Target Date  04/20/19      PT SHORT TERM GOAL #4   Title  Patient will report consistent use of foot-stool (squatty-potty) for positioning with BM to decrease pain with BM and intra-abdominal pressure.    Baseline  Pt. having constipation due to PFM dysfunction    Time  5    Period  Weeks    Status  Achieved    Target Date  04/20/19        PT Long Term Goals - 08/14/19 0001      PT LONG TERM GOAL #1   Title  Pt. will be able to participate in regular ADL's with least restrictive device without pain increasing greater than 2/10    Baseline  Pt. limited in her ability to perform household duties by increased pain and fatigue. As of 4/29: Her legs have more endurance with walking before she gets a tremor. Still needs to sit down the majority of the time she is cooking. Still needs back support for prolonged sitting.    Time  10    Period  Weeks    Status  On-going    Target Date  10/23/19      PT LONG TERM GOAL #2   Title  Patient will score at or below 65/300  on the PFDI and 35% on the Female NIH-CPSI to demonstrate a clinically meaningful decrease in disability and distress due to pelvic floor dysfunction.    Baseline  PFDI: 110/300, Female NIH-CPSI: 29/43 (67%) As of 4/29: 100/300 on PFDI and 30/43 on NIH-CPSI    Time  10    Period  Weeks    Status  Unable to assess    Target Date  10/23/19      PT LONG TERM GOAL #3  Title  Patient will report no pain with intercourse to demonstrate improved functional ability.     Baseline  Pt. Having significant pain with intercourse. As of 4/29: is a little better (20-30%) but is worse on days where overall tension/pain is worse.    Time  10    Period  Weeks    Status  On-going    Target Date  10/23/19      PT LONG TERM GOAL #4   Title  Pt will report ability to work in her yard for greater than 30 minutes without extreme fatigue    Baseline  limited to 10-15 minutes before pt requires ending activity. As of 4/29: ~ 20 min.    Time  10    Period  Weeks    Status  On-going    Target Date  10/23/19      PT LONG TERM GOAL #5   Title  Pt. will be able to go 2-3 hours between emptying her bladder without increased pain and empty her bladder fully whether by urostomy, cath, or voluntary release.    Baseline  Pt. has a urostomy but continues to have to self-catheterize. Has pain with bladder filling and when using catheter. As of 4/29: is doing better since starting to take the pregabalin andd baclofen but occasionally still has pain with bladder filling. It is bad when she has an infection or over-does activity, better on "normal" days which have been few and far between due to frequent UTI's    Time  10    Period  Weeks    Status  On-going    Target Date  10/23/19      PT LONG TERM GOAL #6   Title  Patient will report having BM's at least every-other day with consistency between [facility] stool scale 3-5 over the prior week to demonstrate decreased constipation.    Baseline  Pt. unable to have regula BM's without medication, manometry shows PFM dysfunction. As of 4/29: still having to use and enema or supository occasionally and still needing linzess     Time  10    Period  Weeks    Status  On-going    Target Date  10/23/19            Plan - 08/19/19 1151    Clinical Impression Statement  Pt. Responded well to all interventions today, demonstrating decreased spasm and increased deep-core recruitment/seated balance stability as well as understanding and  correct performance of all education and exercises provided today. They will continue to benefit from skilled physical therapy to work toward remaining goals and maximize function as well as decrease likelihood of symptom increase or recurrence.    PT Next Visit Plan  focus on graded activity, PFM TP release and seated and standing balance/core strength. TPDN with tens at T/L junction review I's Y's and T's in prone, thoracic mobility? perform further release and PNF for R>L shoulder blades, rotational mobilizations through T-L junction, more scar-release and cupping at suprapubic scar, MFR around B ilium. re-assess ROM/back pain. TPDN to multifidus through T-L  junction with E-stim at 40hz  and at 2 hz. continue to work on thoracolumbar rotation ROM and decreasing spasms, add SLS balance training, begin strengthening for reciprocal inhibition. Internal pelvic exam,    PT Home Exercise Plan  diaphragmatic breathing, seated posture (lumbar support and avoiding adduction, low back stretch, side-stretch, hamstring stretch, butterfly stretch, bow-and-arrow, TENS trial, squatty potty, bowel retraining, toileting tips, colonic massage, thoracic extensions and  supine chest stretch with towel roll, hip EXT in prone with knee bent, side-lying hip ABD, child's pose., self internal TP release, I's, Y's, T's, and W's, self TP release to neck/shoulder area, limits of chronic pain/chronic fatigue and moderating activity level, pain/fatigue diary.    Consulted and Agree with Plan of Care  Patient       Patient will benefit from skilled therapeutic intervention in order to improve the following deficits and impairments:     Visit Diagnosis: Other muscle spasm  Muscle weakness (generalized)  Difficulty in walking, not elsewhere classified     Problem List Patient Active Problem List   Diagnosis Date Noted  . Seizure (HCC) 11/11/2018  . Major depressive disorder, recurrent episode, moderate (HCC) 02/07/2018  .  Nephrolithiasis 04/16/2016  . Numbness 07/28/2015  . Bladder retention 06/23/2015  . Abdominal pain 06/04/2015  . Dizziness 05/18/2015  . Neck pain 05/18/2015  . Complicated migraine 04/28/2015  . Other fatigue 04/28/2015  . Abnormal finding on MRI of brain 04/28/2015  . D (diarrhea) 03/29/2015  . H/O disease 03/29/2015  . Abnormal weight loss 03/29/2015  . Muscle weakness (generalized) 03/14/2015  . Headache, migraine 03/10/2015   Cleophus Molt DPT, ATC Cleophus Molt 08/19/2019, 12:03 PM  [place] [facility] MAIN [facility] SERVICES [address] [place], [place], [address] Phone: [phone]   Fax:  [phone]  Name: Tian Davison MRN: [id number] Date of Birth: 01-30-1981

## 2019-08-19 NOTE — Patient Instructions (Signed)
   Posturific brace.  Remember your pain diary.   Sit with good posture for 3 min. Rest for 5 min. Repeat 3 times.

## 2019-08-21 ENCOUNTER — Other Ambulatory Visit: Payer: Self-pay

## 2019-08-21 ENCOUNTER — Ambulatory Visit: Payer: BC Managed Care – PPO

## 2019-08-21 DIAGNOSIS — M62838 Other muscle spasm: Secondary | ICD-10-CM

## 2019-08-21 DIAGNOSIS — M6281 Muscle weakness (generalized): Secondary | ICD-10-CM

## 2019-08-21 DIAGNOSIS — R262 Difficulty in walking, not elsewhere classified: Secondary | ICD-10-CM

## 2019-08-21 NOTE — Therapy (Addendum)
[facility]IN [facility] [address] [place], [place], [address] Phone: [phone]   Fax:  [phone]  Physical Therapy Treatment  The patient has been informed of current processes in place at Outpatient Rehab to protect patients from Covid-19 exposure including social distancing, schedule modifications, and new cleaning procedures. After discussing their particular risk with a therapist based on the patient's personal risk factors, the patient has decided to proceed with in-person therapy.   Patient Details  Name: Christine Chavez MRN: [id number] Date of Birth: [birth year] Referring Provider (PT): Lexine BatonLondon, Hartman [name]   Encounter Date: 08/21/2019  PT End of Session - 08/21/19 1347    Visit Number  35    Number of Visits  53    Date for PT Re-Evaluation  10/23/19    Authorization Type  [employer]    Authorization Time Period  from 08/14/2019 through 10/23/2019    Authorization - Visit Number  3    Authorization - Number of Visits  20    Progress Note Due on Visit  43    PT Start Time  1345    PT Stop Time  1430    PT Time Calculation (min)  45 min    Activity Tolerance  Patient tolerated treatment well;No increased pain    Behavior During Therapy  WFL for tasks assessed/performed       Past Medical History:  Diagnosis Date  . Complication of anesthesia    ? seizures after anesthesia   . Headache   . Migraines   . Neurogenic bladder   . Renal disorder   . Vision abnormalities     Past Surgical History:  Procedure Laterality Date  . ANTERIOR CRUCIATE LIGAMENT REPAIR  1997  . APPENDECTOMY    . COLONOSCOPY WITH PROPOFOL N/A 11/11/2018   Procedure: COLONOSCOPY WITH PROPOFOL;  Surgeon: Christena DeemSkulskie, Martin U, MD;  [facility];  Service: Endoscopy;  Laterality: N/A;  . CYSTOSCOPY WITH STENT PLACEMENT Right 04/17/2016   Procedure: CYSTOSCOPY WITH STENT PLACEMENT;  Surgeon: Malen GauzePatrick L McKenzie, MD;  Location: [facility] ORS;   Service: Urology;  Laterality: Right;  . ESOPHAGOGASTRODUODENOSCOPY (EGD) WITH PROPOFOL N/A 11/11/2018   Procedure: ESOPHAGOGASTRODUODENOSCOPY (EGD) WITH PROPOFOL;  Surgeon: Christena DeemSkulskie, Martin U, MD;  [facility];  Service: Endoscopy;  Laterality: N/A;  . EXPLORATORY LAPAROTOMY  1999  . KIDNEY STONE SURGERY  04/2016  . REVISION UROSTOMY CUTANEOUS    . REVISION UROSTOMY CUTANEOUS  01/10/2018  . SUPRAPUBIC CATHETER PLACEMENT  08/2017  . TONSILLECTOMY      There were no vitals filed for this visit.   Pelvic Floor Physical Therapy Treatment Note  SCREENING  Changes in medications, allergies, or medical history?: pudendal nerve block scheduled for Wed. May 5th.  SUBJECTIVE  Patient reports: She has her nerve block yesterday and everything feels strange "down there", like when you go to the dentist's office. Even has a harder time catheterizing because she has decreased sensation. Can still feel pressure and discomfort more than sharp pain. She is still worried that she may have a UTI.   Precautions:  Neurological diagnosis pending, has "non-specific" brain lesions   Pain update:  Location of pain: B flank pain (pelvic area) Current pain:  3-4/10 (6/10, increases with motion) Max pain:  5/10 (9/10 when it spasms) Least pain:  2/10 (2/10) Nature of pain: sharp to dull ache  ** no increased pain following treatment   Patient Goals: Get rid of the discomfort with catheterizing and  ache/bladder pain. Be able to take fewer medications and be able to enjoy spending time with her family again, doing "all the mom things"    OBJECTIVE  Changes in: Range of Motion/Flexibilty:  (from 12/10) Spine: ~ 2-3 fingers from knee with SB B. ~ 50% reduced R rotation with stretch in L, ~ 75% reduced L rotation with increased tightness. Forward bend: 19 in.from floor. Hips: Unable to achieve neutral pelvis in long-sitting due to hamstring tightness.   Strength/MMT:  LE MMT (from  12-10) LE MMT Left Right  Hip flex:  (L2) /5 /5  Hip ext: 3+/5 3+/5  Hip abd: 4/5 4/5  Hip add: 4/5 3+/5  Hip IR 4/5 3+/5  Hip ER 4/5 4/5   (From 3/24): Pt. Demonstrates 3/5 strength with "I's, Y's, and T's" but 4/5 with W's.  Balance/stability: Pt. Able to maintain upright posture with no UE support and slow deliberate perturbations for ~ 2 min with increasing stability and 1 min. With increased speed of direction change though greater variation from the center occurred and it took longer for correction and improved by ~ 50% over the time performed. (from prior visit)  Pelvic Floor External Exam: (From prior session)- [Introitus Appears: elevated Skin integrity: WNL Palpation: TTP to B STP Cough: limited motion Prolapse visible?: no Scar mobility: not assessed  Internal Vaginal Exam: Strength (PERF): Pt. Has difficulty sensing the PFM to generate contraction or relaxation due to high tone/neuro involvement Symmetry: L>R for spasm/TTP Palpation: TTP to all muscles throughout B Prolapse: none]  Today: TTP to B posterior PR/PC and OI   Palpation:   Gait Analysis:  INTERVENTIONS THIS SESSION:  Manual: Performed TP release internally to B posterior PR/PC and OI to decrease spasm and pain and allow for improved balance of musculature for improved function and decreased symptoms.   Total time: 45 min.                            PT Short Term Goals - 08/14/19 1031      PT SHORT TERM GOAL #1   Title  Patient will demonstrate coordinated diaphragmatic breathing with pelvic tilts to demonstrate improved control of diaphragm and TA, to allow for further strengthening of core musculature and decreased pelvic floor spasm.    Baseline  Pt. demonstrates breathing dysfunction and poor PFM coordination evidenced by anal manometry As of 2/22: Pt. continues to have restriction in her diaphragm and near T/L junction but is able to intentionally use diaphragmatic  breathing through the available ROM.    Time  5    Period  Weeks    Status  Achieved    Target Date  04/20/19      PT SHORT TERM GOAL #2   Title  Patient will report a reduction in pain to no greater than 6/10 over the prior week to demonstrate symptom improvement.    Baseline  Pain is 10/10 at worst, 2/10 at best As of 2/22: Pt. had achieved this goal for 1 week as of 1/21 but may have infection or other insult that caused pain to increase again. As of 4/29: Pain high of 8/10 but has been over-doing her activity over the past week.    Time  5    Period  Weeks    Status  On-going    Target Date  10/23/19      PT SHORT TERM GOAL #3   Title  Patient will demonstrate  HEP x1 in the clinic to demonstrate understanding and proper form to allow for further improvement.    Baseline  Pt. lacks knowledge of therepeutic exercises that can decrease her pain/Sx.    Time  5    Period  Weeks    Status  Achieved    Target Date  04/20/19      PT SHORT TERM GOAL #4   Title  Patient will report consistent use of foot-stool (squatty-potty) for positioning with BM to decrease pain with BM and intra-abdominal pressure.    Baseline  Pt. having constipation due to PFM dysfunction    Time  5    Period  Weeks    Status  Achieved    Target Date  04/20/19        PT Long Term Goals - 08/14/19 0001      PT LONG TERM GOAL #1   Title  Pt. will be able to participate in regular ADL's with least restrictive device without pain increasing greater than 2/10    Baseline  Pt. limited in her ability to perform household duties by increased pain and fatigue. As of 4/29: Her legs have more endurance with walking before she gets a tremor. Still needs to sit down the majority of the time she is cooking. Still needs back support for prolonged sitting.    Time  10    Period  Weeks    Status  On-going    Target Date  10/23/19      PT LONG TERM GOAL #2   Title  Patient will score at or below 65/300  on the PFDI and 35%  on the Female NIH-CPSI to demonstrate a clinically meaningful decrease in disability and distress due to pelvic floor dysfunction.    Baseline  PFDI: 110/300, Female NIH-CPSI: 29/43 (67%) As of 4/29: 100/300 on PFDI and 30/43 on NIH-CPSI    Time  10    Period  Weeks    Status  Unable to assess    Target Date  10/23/19      PT LONG TERM GOAL #3   Title  Patient will report no pain with intercourse to demonstrate improved functional ability.    Baseline  Pt. Having significant pain with intercourse. As of 4/29: is a little better (20-30%) but is worse on days where overall tension/pain is worse.    Time  10    Period  Weeks    Status  On-going    Target Date  10/23/19      PT LONG TERM GOAL #4   Title  Pt will report ability to work in her yard for greater than 30 minutes without extreme fatigue    Baseline  limited to 10-15 minutes before pt requires ending activity. As of 4/29: ~ 20 min.    Time  10    Period  Weeks    Status  On-going    Target Date  10/23/19      PT LONG TERM GOAL #5   Title  Pt. will be able to go 2-3 hours between emptying her bladder without increased pain and empty her bladder fully whether by urostomy, cath, or voluntary release.    Baseline  Pt. has a urostomy but continues to have to self-catheterize. Has pain with bladder filling and when using catheter. As of 4/29: is doing better since starting to take the pregabalin andd baclofen but occasionally still has pain with bladder filling. It is bad when she has an infection  or over-does activity, better on "normal" days which have been few and far between due to frequent UTI's    Time  10    Period  Weeks    Status  On-going    Target Date  10/23/19      PT LONG TERM GOAL #6   Title  Patient will report having BM's at least every-other day with consistency between [facility] stool scale 3-5 over the prior week to demonstrate decreased constipation.    Baseline  Pt. unable to have regula BM's without medication,  manometry shows PFM dysfunction. As of 4/29: still having to use and enema or supository occasionally and still needing linzess     Time  10    Period  Weeks    Status  On-going    Target Date  10/23/19            Plan - 08/21/19 1347    Clinical Impression Statement  Pt. Responded well to all interventions today, demonstrating decreased posterior PFM spasm B as well as understanding and correct performance of all education and exercises provided today. They will continue to benefit from skilled physical therapy to work toward remaining goals and maximize function as well as decrease likelihood of symptom increase or recurrence.    PT Next Visit Plan  focus on graded activity, PFM TP release and seated and standing balance/core strength. TPDN with tens at T/L junction review I's Y's and T's in prone, thoracic mobility? perform further release and PNF for R>L shoulder blades, rotational mobilizations through T-L junction, more scar-release and cupping at suprapubic scar, MFR around B ilium. re-assess ROM/back pain. TPDN to multifidus through T-L  junction with E-stim at 40hz  and at 2 hz. continue to work on thoracolumbar rotation ROM and decreasing spasms, add SLS balance training, begin strengthening for reciprocal inhibition. Internal pelvic exam,    PT Home Exercise Plan  diaphragmatic breathing, seated posture (lumbar support and avoiding adduction, low back stretch, side-stretch, hamstring stretch, butterfly stretch, bow-and-arrow, TENS trial, squatty potty, bowel retraining, toileting tips, colonic massage, thoracic extensions and supine chest stretch with towel roll, hip EXT in prone with knee bent, side-lying hip ABD, child's pose., self internal TP release, I's, Y's, T's, and W's, self TP release to neck/shoulder area, limits of chronic pain/chronic fatigue and moderating activity level, pain/fatigue diary.    Consulted and Agree with Plan of Care  Patient       Patient will benefit from  skilled therapeutic intervention in order to improve the following deficits and impairments:     Visit Diagnosis: Other muscle spasm  Muscle weakness (generalized)  Difficulty in walking, not elsewhere classified     Problem List Patient Active Problem List   Diagnosis Date Noted  . Seizure (HCC) 11/11/2018  . Major depressive disorder, recurrent episode, moderate (HCC) 02/07/2018  . Nephrolithiasis 04/16/2016  . Numbness 07/28/2015  . Bladder retention 06/23/2015  . Abdominal pain 06/04/2015  . Dizziness 05/18/2015  . Neck pain 05/18/2015  . Complicated migraine 04/28/2015  . Other fatigue 04/28/2015  . Abnormal finding on MRI of brain 04/28/2015  . D (diarrhea) 03/29/2015  . H/O disease 03/29/2015  . Abnormal weight loss 03/29/2015  . Muscle weakness (generalized) 03/14/2015  . Headache, migraine 03/10/2015   03/12/2015 DPT, ATC Christine Chavez 08/22/2019, 11:56 AM  [facility] [facility] MAIN [facility] SERVICES [address] [place], [place], [place] Phone: [phone]   Fax:  [phone]  Name: Christine Axelson  Chavez MRN: [id number] Date of Birth: 07-19-1980

## 2019-08-27 ENCOUNTER — Telehealth (INDEPENDENT_AMBULATORY_CARE_PROVIDER_SITE_OTHER): Payer: BC Managed Care – PPO | Admitting: Psychiatry

## 2019-08-27 ENCOUNTER — Telehealth: Payer: Self-pay | Admitting: Psychiatry

## 2019-08-27 ENCOUNTER — Encounter: Payer: Self-pay | Admitting: Psychiatry

## 2019-08-27 VITALS — Wt 145.0 lb

## 2019-08-27 DIAGNOSIS — F331 Major depressive disorder, recurrent, moderate: Secondary | ICD-10-CM | POA: Diagnosis not present

## 2019-08-27 DIAGNOSIS — F419 Anxiety disorder, unspecified: Secondary | ICD-10-CM | POA: Diagnosis not present

## 2019-08-27 MED ORDER — BUPROPION HCL ER (SR) 100 MG PO TB12: ORAL_TABLET | ORAL | 0 refills | Status: DC

## 2019-08-27 MED ORDER — VORTIOXETINE HBR 20 MG PO TABS: 20.0000 mg | ORAL_TABLET | Freq: Every day | ORAL | 0 refills | Status: DC

## 2019-08-27 NOTE — Telephone Encounter (Signed)
[name], Christine Chavez are scheduled for a virtual visit with your provider today.    Just as we do with appointments in the office, we must obtain your consent to participate.  Your consent will be active for this visit and any virtual visit you may have with one of our providers in the next 365 days.    If you have a MyChart account, I can also send a copy of this consent to you electronically.  All virtual visits are billed to your insurance company just like a traditional visit in the office.  As this is a virtual visit, video technology does not allow for your provider to perform a traditional examination.  This may limit your provider's ability to fully assess your condition.  If your provider identifies any concerns that need to be evaluated in person or the need to arrange testing such as labs, EKG, etc, we will make arrangements to do so.    Although advances in technology are sophisticated, we cannot ensure that it will always work on either your end or our end.  If the connection with a video visit is poor, we may have to switch to a telephone visit.  With either a video or telephone visit, we are not always able to ensure that we have a secure connection.   I need to obtain your verbal consent now.   Are you willing to proceed with your visit today?   Christine Chavez has provided verbal consent on 08/27/2019 for a virtual visit (video or telephone).   Christine Chavez, PMHNP 08/27/2019  9:01 AM

## 2019-08-27 NOTE — Progress Notes (Signed)
Christine Chavez [id number] May 05, 1980 39 y.o.  Virtual Visit via Video Note  I connected with pt @ on 08/27/19 at  9:00 AM EDT by a video enabled telemedicine application and verified that I am speaking with the correct person using two identifiers.   I discussed the limitations of evaluation and management by telemedicine and the availability of in person appointments. The patient expressed understanding and agreed to proceed.  I discussed the assessment and treatment plan with the patient. The patient was provided an opportunity to ask questions and all were answered. The patient agreed with the plan and demonstrated an understanding of the instructions.   The patient was advised to call back or seek an in-person evaluation if the symptoms worsen or if the condition fails to improve as anticipated.  I provided 30 minutes of non-face-to-face time during this encounter.  The patient was located at home.  The provider was located at [place].   Thayer Headings, PMHNP   Subjective:   Patient ID:  Christine Chavez is a 39 y.o. (DOB [birth year]) female.  Chief Complaint:  Chief Complaint  Patient presents with  . Anxiety  . Follow-up    Depression, h/o insomnia    HPI [address] [place] presents for follow-up of mood and anxiety. She reports that her mood has been "maintained for the most part... I am still depressed but not as severe as it was." She notices more worry to include worrying about coordinating things for the end of the school year for her children and also multiple appointments. Denies panic attacks. She reports that she will feel nervous in certain situations. She has been trying to focus on intentional rest and has difficulty resting her mind. Planning to use some aps for guided relaxation. Has been having some fatigue and thinks that infection is contributing to this. She reports that she is feeling tired upon awakening most mornings. She reports that she  goes to sleep without difficulty. She reports that her motivation has been good but is limited by physical abilities. Continues to experience feelings of guilt when she does not accomplish the things she thinks she should. She reports that she starts tasks at times and then does not complete them. She reports that she is more distracted when worry and anxiety is higher. Appetite has improved. Reports that she has noticed some wt gain without a significant change in food intake. Denies SI.   Has continues to see psychologist.  Past medication Trials: Remeron- Reports sleeping better at 15 mg but feels more lethargic upon awakening compared to 7.5 mg.  Sertraline- Has been on 200 mg for the last month and has not noticed a significant improvement. Cymbalta- Reports increased drowsiness and impaired concentration at 90 mg. Trintellix Wellbutrin Rexulti-Increased heart rate and SOB Abilify Gabapentin- Causes drowsiness. Anxiety may have increased when she stopped taking TID. Reports that is did not seem to be effective for pain Lyrica Did not start Lithium since PCP advised against it  Review of Systems:  Review of Systems  Constitutional: Positive for fatigue.  Genitourinary:       Current UTI  Musculoskeletal:       She reports that her gait has improved with Baclofen and having less spasticity  Neurological: Positive for tremors.       She reports improved tremors with Baclofen  Psychiatric/Behavioral:       Please refer to HPI   Currently has an infection. She has also had a nerve block. Has  upcoming apts with neurology and urology.   Medications: I have reviewed the patient's current medications.  Current Outpatient Medications  Medication Sig Dispense Refill  . B Complex Vitamins (VITAMIN B COMPLEX 100) INJ Inject as directed.    . baclofen (LIORESAL) 10 MG tablet Take 10 mg by mouth 3 (three) times daily.    . belladonna-opium (B&O SUPPRETTES) 16.2-30 MG suppository Place 30  mg rectally every 8 (eight) hours as needed for pain.    Marland Kitchen buPROPion (WELLBUTRIN SR) 100 MG 12 hr tablet TAKE 1 TABLET BY MOUTH EVERY DAY IN THE MORNING 90 tablet 0  . butalbital-acetaminophen-caffeine (FIORICET, ESGIC) 50-325-40 MG tablet Take by mouth.    . Cyanocobalamin (VITAMIN B-12) 2500 MCG SUBL Take 2,500 mcg by mouth daily.    . folic acid (FOLVITE) 1 MG tablet Take 1 mg by mouth daily.    Marland Kitchen LINZESS 72 MCG capsule TAKE 1 CAPSULE (72 MCG TOTAL) BY MOUTH ONCE DAILY    . mirabegron ER (MYRBETRIQ) 50 MG TB24 tablet Take 50 mg by mouth daily.     . mirtazapine (REMERON) 15 MG tablet Take 1 tablet (15 mg total) by mouth at bedtime. 90 tablet 1  . ondansetron (ZOFRAN) 4 MG tablet Take 4 mg by mouth every 8 (eight) hours as needed for nausea or vomiting.    Marland Kitchen oxybutynin (DITROPAN) 5 MG tablet Take 5 mg by mouth every 8 (eight) hours as needed.     . pantoprazole (PROTONIX) 40 MG tablet Take 40 mg by mouth 2 (two) times daily.     . pregabalin (LYRICA) 75 MG capsule Takes 2 caps po q am and 2 caps po QHS    . rizatriptan (MAXALT) 5 MG tablet Take 5 mg by mouth as needed.     . sulfamethoxazole-trimethoprim (BACTRIM DS) 800-160 MG tablet Take by mouth.    . Trospium Chloride 60 MG CP24 Take by mouth.    . vortioxetine HBr (TRINTELLIX) 20 MG TABS tablet Take 1 tablet (20 mg total) by mouth daily. 90 tablet 0  . promethazine (PHENERGAN) 12.5 MG tablet Take 1 tablet (12.5 mg total) by mouth every 4 (four) hours as needed for nausea or vomiting. 20 tablet 0  . trimethoprim (TRIMPEX) 100 MG tablet Take by mouth.     No current facility-administered medications for this visit.    Medication Side Effects: None  Allergies:  Allergies  Allergen Reactions  . Aspirin Shortness Of Breath, Swelling and Anaphylaxis  . Ibuprofen Shortness Of Breath, Swelling and Anaphylaxis  . Morphine And Related Hives    Past Medical History:  Diagnosis Date  . Complication of anesthesia    ? seizures after  anesthesia   . Headache   . Migraines   . Neurogenic bladder   . Renal disorder   . Vision abnormalities     Family History  Problem Relation Age of Onset  . Hypertension Mother   . Atrial fibrillation Father   . Healthy Brother   . Depression Brother   . Arthritis/Rheumatoid Paternal Grandmother   . Healthy Brother     Social History   Socioeconomic History  . Marital status: Married    Spouse name: Not on file  . Number of children: Not on file  . Years of education: Not on file  . Highest education level: Not on file  Occupational History  . Not on file  Tobacco Use  . Smoking status: Never Smoker  . Smokeless tobacco: Never Used  Substance and  Sexual Activity  . Alcohol use: No    Alcohol/week: 0.0 standard drinks  . Drug use: No  . Sexual activity: Yes    Birth control/protection: None    Comment: vasectomy  Other Topics Concern  . Not on file  Social History Narrative  . Not on file   Social Determinants of Health   Financial Resource Strain:   . Difficulty of Paying Living Expenses:   Food Insecurity:   . Worried About Programme researcher, broadcasting/film/video in the Last Year:   . Barista in the Last Year:   Transportation Needs:   . Freight forwarder (Medical):   Marland Kitchen Lack of Transportation (Non-Medical):   Physical Activity:   . Days of Exercise per Week:   . Minutes of Exercise per Session:   Stress:   . Feeling of Stress :   Social Connections:   . Frequency of Communication with Friends and Family:   . Frequency of Social Gatherings with Friends and Family:   . Attends Religious Services:   . Active Member of Clubs or Organizations:   . Attends Banker Meetings:   Marland Kitchen Marital Status:   Intimate Partner Violence:   . Fear of Current or Ex-Partner:   . Emotionally Abused:   Marland Kitchen Physically Abused:   . Sexually Abused:     Past Medical History, Surgical history, Social history, and Family history were reviewed and updated as appropriate.    Please see review of systems for further details on the patient's review from today.   Objective:   Physical Exam:  Wt 145 lb (65.8 kg)   BMI 22.71 kg/m   Physical Exam Neurological:     Mental Status: She is alert and oriented to person, place, and time.     Cranial Nerves: No dysarthria.  Psychiatric:        Attention and Perception: Attention and perception normal.        Mood and Affect: Mood is anxious.        Speech: Speech normal.        Behavior: Behavior is cooperative.        Thought Content: Thought content normal. Thought content is not paranoid or delusional. Thought content does not include homicidal or suicidal ideation. Thought content does not include homicidal or suicidal plan.        Cognition and Memory: Cognition and memory normal.        Judgment: Judgment normal.     Comments: Insight intact Mood presents as less depressed      Lab Review:     Component Value Date/Time   NA 139 11/12/2018 0520   K 3.4 (L) 11/12/2018 0520   CL 110 11/12/2018 0520   CO2 24 11/12/2018 0520   GLUCOSE 91 11/12/2018 0520   BUN <5 (L) 11/12/2018 0520   CREATININE 0.65 11/12/2018 0520   CALCIUM 8.0 (L) 11/12/2018 0520   PROT 7.2 03/03/2017 2132   ALBUMIN 4.3 03/03/2017 2132   AST 17 03/03/2017 2132   ALT 15 03/03/2017 2132   ALKPHOS 51 03/03/2017 2132   BILITOT 1.7 (H) 03/03/2017 2132   GFRNONAA >60 11/12/2018 0520   GFRAA >60 11/12/2018 0520       Component Value Date/Time   WBC 7.2 11/12/2018 0520   RBC 3.59 (L) 11/12/2018 0520   HGB 11.2 (L) 11/12/2018 0520   HCT 33.8 (L) 11/12/2018 0520   HCT 31.7 (L) 04/28/2012 0618   PLT 252 11/12/2018 0520  MCV 94.2 11/12/2018 0520   MCH 31.2 11/12/2018 0520   MCHC 33.1 11/12/2018 0520   RDW 13.4 11/12/2018 0520   LYMPHSABS 3.3 03/03/2017 2132   MONOABS 0.6 03/03/2017 2132   EOSABS 0.1 03/03/2017 2132   BASOSABS 0.1 03/03/2017 2132    No results found for: POCLITH, LITHIUM   No results found for:  PHENYTOIN, PHENOBARB, VALPROATE, CBMZ   .res Assessment: Plan:    Patient seen for 30 minutes and time spent counseling patient regarding treatment options for anxiety to include potential benefits, risks, and side effects of Buspar.  Patient reports that current anxiety may be situational and that she will hold off on starting any medication at this time and will continue to work with her therapist and other stress management techniques, and will call office if her anxiety worsens or does not improve to request starting BuSpar.  Patient reports that she would like to continue current medications for depression since she has noticed less depression recently. Continue Trintellix for mood and anxiety. Continue Wellbutrin SR 100 mg p.o. every morning for depression. Continue Remeron 15 mg p.o. nightly for depression and insomnia. Patient to follow-up in 2 months or sooner if clinically indicated. Recommend continuing to work with health psychologist in therapy. Patient advised to contact office with any questions, adverse effects, or acute worsening in signs and symptoms.  Shyvonne was seen today for anxiety and follow-up.  Diagnoses and all orders for this visit:  Anxiety disorder, unspecified type -     vortioxetine HBr (TRINTELLIX) 20 MG TABS tablet; Take 1 tablet (20 mg total) by mouth daily.  Moderate episode of recurrent major depressive disorder (HCC) -     buPROPion (WELLBUTRIN SR) 100 MG 12 hr tablet; TAKE 1 TABLET BY MOUTH EVERY DAY IN THE MORNING -     vortioxetine HBr (TRINTELLIX) 20 MG TABS tablet; Take 1 tablet (20 mg total) by mouth daily.     Please see After Visit Summary for patient specific instructions.  Future Appointments  Date Time Provider Department Center  08/28/2019 10:00 AM Flora Lipps T, PT [facility] None  09/02/2019 10:30 AM Flora Lipps T, PT [facility] None  09/04/2019 11:00 AM Flora Lipps T, PT [facility] None  09/08/2019  3:00 PM Flora Lipps T, PT  [facility] None  09/11/2019 10:00 AM Flora Lipps T, PT [facility] None    No orders of the defined types were placed in this encounter.     -------------------------------

## 2019-08-28 ENCOUNTER — Ambulatory Visit: Payer: BC Managed Care – PPO

## 2019-09-02 ENCOUNTER — Ambulatory Visit: Payer: BC Managed Care – PPO

## 2019-09-02 ENCOUNTER — Other Ambulatory Visit: Payer: Self-pay

## 2019-09-02 DIAGNOSIS — R262 Difficulty in walking, not elsewhere classified: Secondary | ICD-10-CM

## 2019-09-02 DIAGNOSIS — M62838 Other muscle spasm: Secondary | ICD-10-CM | POA: Diagnosis not present

## 2019-09-02 DIAGNOSIS — M6281 Muscle weakness (generalized): Secondary | ICD-10-CM

## 2019-09-02 NOTE — Therapy (Signed)
[facility] MAIN [facility] SERVICES [address] [place], [place], [address] Phone: [phone]   Fax:  [phone]  Physical Therapy Treatment  The patient has been informed of current processes in place at [facility]patient [facility]hab to protect patients from Covid-19 exposure including social distancing, schedule modifications, and new cleaning procedures. After discussing their particular risk with a therapist based on the patient's personal risk factors, the patient has decided to proceed with in-person therapy.   Patient Details  Name: Christine Chavez MRN: [id number] Date of Birth: 1980-05-25 Referring Provider (PT): [name]   Encounter Date: 09/02/2019  PT End of Session - 09/02/19 1249    Visit Number  36    Number of Visits  53    Date for PT Re-Evaluation  10/23/19    Authorization Type  [employer]    Authorization Time Period  from 08/14/2019 through 10/23/2019    Authorization - Visit Number  4    Authorization - Number of Visits  20    Progress Note Due on Visit  43    PT Start Time  1035    PT Stop Time  1135    PT Time Calculation (min)  60 min    Activity Tolerance  Patient tolerated treatment well;No increased pain    Behavior During Therapy  WFL for tasks assessed/performed       Past Medical History:  Diagnosis Date  . Complication of anesthesia    ? seizures after anesthesia   . Headache   . Migraines   . Neurogenic bladder   . Renal disorder   . Vision abnormalities     Past Surgical History:  Procedure Laterality Date  . ANTERIOR CRUCIATE LIGAMENT REPAIR  1997  . APPENDECTOMY    . COLONOSCOPY WITH PROPOFOL N/A 11/11/2018   Procedure: COLONOSCOPY WITH PROPOFOL;  Surgeon: Christena Deem, MD;  Location: [facility] ENDOSCOPY;  Service: Endoscopy;  Laterality: N/A;  . CYSTOSCOPY WITH STENT PLACEMENT Right 04/17/2016   Procedure: CYSTOSCOPY WITH STENT PLACEMENT;  Surgeon: Malen Gauze, MD;  Location: [facility] ORS;   Service: Urology;  Laterality: Right;  . ESOPHAGOGASTRODUODENOSCOPY (EGD) WITH PROPOFOL N/A 11/11/2018   Procedure: ESOPHAGOGASTRODUODENOSCOPY (EGD) WITH PROPOFOL;  Surgeon: Christena Deem, MD;  Location: [facility] ENDOSCOPY;  Service: Endoscopy;  Laterality: N/A;  . EXPLORATORY LAPAROTOMY  1999  . KIDNEY STONE SURGERY  04/2016  . REVISION UROSTOMY CUTANEOUS    . REVISION UROSTOMY CUTANEOUS  01/10/2018  . SUPRAPUBIC CATHETER PLACEMENT  08/2017  . TONSILLECTOMY      There were no vitals filed for this visit.  Pelvic Floor Physical Therapy Treatment Note  SCREENING  Changes in medications, allergies, or medical history?: UTI confirmed and treated  SUBJECTIVE  Patient reports: The pressure sensation is now doing somewhat better, Still having some flare-ups but has not had any major event that seems to have flared things up other than the infection. The L flank pain has been really irritated. Has chills on-and-off still. Does not think she had a fever but had night sweats still. Is still on Bacterim and then will go back to the daily antibiotic. Her Urologist wants to scope the bladder again. For the first 48-72 hrs. It all felt very numb still but then that eased off and she still feels discomfort but not as much. Has a sneeze-pee event.  Precautions:  Neurological diagnosis pending, has "non-specific" brain lesions   Pain update:  Location of pain: L Flank pain (pelvic area)  Current pain:  5/10 (6/10, increases with motion) Max pain:  5/10 (9/10 when it spasms) Least pain:  2/10 (2/10) Nature of pain: sharp to dull ache  ** achy from needling post-session  Patient Goals: Get rid of the discomfort with catheterizing and ache/bladder pain. Be able to take fewer medications and be able to enjoy spending time with her family again, doing "all the mom things"    OBJECTIVE  Changes in: Range of Motion/Flexibilty:  (from 12/10) Spine: ~ 2-3 fingers from knee with SB B. ~ 50%  reduced R rotation with stretch in L, ~ 75% reduced L rotation with increased tightness. Forward bend: 19 in.from floor. Hips: Unable to achieve neutral pelvis in long-sitting due to hamstring tightness.   Strength/MMT:  LE MMT (from 12-10) LE MMT Left Right  Hip flex:  (L2) /5 /5  Hip ext: 3+/5 3+/5  Hip abd: 4/5 4/5  Hip add: 4/5 3+/5  Hip IR 4/5 3+/5  Hip ER 4/5 4/5   (From 3/24): Pt. Demonstrates 3/5 strength with "I's, Y's, and T's" but 4/5 with W's.  Balance/stability: Pt. Able to maintain upright posture with no UE support and slow deliberate perturbations for ~ 2 min with increasing stability and 1 min. With increased speed of direction change though greater variation from the center occurred and it took longer for correction and improved by ~ 50% over the time performed. (from prior visit)  Pelvic Floor External Exam: (From prior session)- [Introitus Appears: elevated Skin integrity: WNL Palpation: TTP to B STP Cough: limited motion Prolapse visible?: no Scar mobility: not assessed  Internal Vaginal Exam: Strength (PERF): Pt. Has difficulty sensing the PFM to generate contraction or relaxation due to high tone/neuro involvement Symmetry: L>R for spasm/TTP Palpation: TTP to all muscles throughout B Prolapse: none]  Today: TTP toL QL and lumbar erector spinae  Palpation:   Gait Analysis:  INTERVENTIONS THIS SESSION:  Manual: Performed TP release to L QL and lumbar erector spinae to decrease spasm and pain and allow for improved balance of musculature for improved function and decreased symptoms.  Theract: reviewed side-stretch importance and using posturific brace to improve posture. Discussed impact of pudendal nerve block and remaining Sx.   Total time: 60 min.                      Trigger Point Dry Needling - 09/02/19 0001    Consent Given?  Yes    Education Handout Provided  No    Muscles Treated Back/Hip  Erector spinae;Quadratus  lumborum    Dry Needling Comments  L, Lumbar    Erector spinae Response  Twitch response elicited;Palpable increased muscle length    Quadratus Lumborum Response  Twitch response elicited;Palpable increased muscle length             PT Short Term Goals - 08/14/19 1031      PT SHORT TERM GOAL #1   Title  Patient will demonstrate coordinated diaphragmatic breathing with pelvic tilts to demonstrate improved control of diaphragm and TA, to allow for further strengthening of core musculature and decreased pelvic floor spasm.    Baseline  Pt. demonstrates breathing dysfunction and poor PFM coordination evidenced by anal manometry As of 2/22: Pt. continues to have restriction in her diaphragm and near T/L junction but is able to intentionally use diaphragmatic breathing through the available ROM.    Time  5    Period  Weeks    Status  Achieved  Target Date  04/20/19      PT SHORT TERM GOAL #2   Title  Patient will report a reduction in pain to no greater than 6/10 over the prior week to demonstrate symptom improvement.    Baseline  Pain is 10/10 at worst, 2/10 at best As of 2/22: Pt. had achieved this goal for 1 week as of 1/21 but may have infection or other insult that caused pain to increase again. As of 4/29: Pain high of 8/10 but has been over-doing her activity over the past week.    Time  5    Period  Weeks    Status  On-going    Target Date  10/23/19      PT SHORT TERM GOAL #3   Title  Patient will demonstrate HEP x1 in the clinic to demonstrate understanding and proper form to allow for further improvement.    Baseline  Pt. lacks knowledge of therepeutic exercises that can decrease her pain/Sx.    Time  5    Period  Weeks    Status  Achieved    Target Date  04/20/19      PT SHORT TERM GOAL #4   Title  Patient will report consistent use of foot-stool (squatty-potty) for positioning with BM to decrease pain with BM and intra-abdominal pressure.    Baseline  Pt. having  constipation due to PFM dysfunction    Time  5    Period  Weeks    Status  Achieved    Target Date  04/20/19        PT Long Term Goals - 08/14/19 0001      PT LONG TERM GOAL #1   Title  Pt. will be able to participate in regular ADL's with least restrictive device without pain increasing greater than 2/10    Baseline  Pt. limited in her ability to perform household duties by increased pain and fatigue. As of 4/29: Her legs have more endurance with walking before she gets a tremor. Still needs to sit down the majority of the time she is cooking. Still needs back support for prolonged sitting.    Time  10    Period  Weeks    Status  On-going    Target Date  10/23/19      PT LONG TERM GOAL #2   Title  Patient will score at or below 65/300  on the PFDI and 35% on the Female NIH-CPSI to demonstrate a clinically meaningful decrease in disability and distress due to pelvic floor dysfunction.    Baseline  PFDI: 110/300, Female NIH-CPSI: 29/43 (67%) As of 4/29: 100/300 on PFDI and 30/43 on NIH-CPSI    Time  10    Period  Weeks    Status  Unable to assess    Target Date  10/23/19      PT LONG TERM GOAL #3   Title  Patient will report no pain with intercourse to demonstrate improved functional ability.    Baseline  Pt. Having significant pain with intercourse. As of 4/29: is a little better (20-30%) but is worse on days where overall tension/pain is worse.    Time  10    Period  Weeks    Status  On-going    Target Date  10/23/19      PT LONG TERM GOAL #4   Title  Pt will report ability to work in her yard for greater than 30 minutes without extreme fatigue  Baseline  limited to 10-15 minutes before pt requires ending activity. As of 4/29: ~ 20 min.    Time  10    Period  Weeks    Status  On-going    Target Date  10/23/19      PT LONG TERM GOAL #5   Title  Pt. will be able to go 2-3 hours between emptying her bladder without increased pain and empty her bladder fully whether by  urostomy, cath, or voluntary release.    Baseline  Pt. has a urostomy but continues to have to self-catheterize. Has pain with bladder filling and when using catheter. As of 4/29: is doing better since starting to take the pregabalin andd baclofen but occasionally still has pain with bladder filling. It is bad when she has an infection or over-does activity, better on "normal" days which have been few and far between due to frequent UTI's    Time  10    Period  Weeks    Status  On-going    Target Date  10/23/19      PT LONG TERM GOAL #6   Title  Patient will report having BM's at least every-other day with consistency between [facility] stool scale 3-5 over the prior week to demonstrate decreased constipation.    Baseline  Pt. unable to have regula BM's without medication, manometry shows PFM dysfunction. As of 4/29: still having to use and enema or supository occasionally and still needing linzess     Time  10    Period  Weeks    Status  On-going    Target Date  10/23/19            Plan - 09/02/19 1250    Clinical Impression Statement  Pt. Responded well to all interventions today, demonstrating decreased spasm and TTP in L QL and erector spinae as well as understanding and correct performance of all education and exercises provided today. They will continue to benefit from skilled physical therapy to work toward remaining goals and maximize function as well as decrease likelihood of symptom increase or recurrence.    PT Next Visit Plan  focus on graded activity, PFM TP release and seated and standing balance/core strength. TPDN with tens at T/L junction review I's Y's and T's in prone, thoracic mobility? perform further release and PNF for R>L shoulder blades, rotational mobilizations through T-L junction, more scar-release and cupping at suprapubic scar, MFR around B ilium. re-assess ROM/back pain. TPDN to multifidus through T-L  junction with E-stim at 40hz  and at 2 hz. continue to work on  thoracolumbar rotation ROM and decreasing spasms, add SLS balance training, begin strengthening for reciprocal inhibition. Internal pelvic exam,    PT Home Exercise Plan  diaphragmatic breathing, seated posture (lumbar support and avoiding adduction, low back stretch, side-stretch, hamstring stretch, butterfly stretch, bow-and-arrow, TENS trial, squatty potty, bowel retraining, toileting tips, colonic massage, thoracic extensions and supine chest stretch with towel roll, hip EXT in prone with knee bent, side-lying hip ABD, child's pose., self internal TP release, I's, Y's, T's, and W's, self TP release to neck/shoulder area, limits of chronic pain/chronic fatigue and moderating activity level, pain/fatigue diary.    Consulted and Agree with Plan of Care  Patient       Patient will benefit from skilled therapeutic intervention in order to improve the following deficits and impairments:     Visit Diagnosis: Other muscle spasm  Muscle weakness (generalized)  Difficulty in walking, not elsewhere classified  Problem List Patient Active Problem List   Diagnosis Date Noted  . Seizure (HCC) 11/11/2018  . Major depressive disorder, recurrent episode, moderate (HCC) 02/07/2018  . Nephrolithiasis 04/16/2016  . Numbness 07/28/2015  . Bladder retention 06/23/2015  . Abdominal pain 06/04/2015  . Dizziness 05/18/2015  . Neck pain 05/18/2015  . Complicated migraine 04/28/2015  . Other fatigue 04/28/2015  . Abnormal finding on MRI of brain 04/28/2015  . D (diarrhea) 03/29/2015  . H/O disease 03/29/2015  . Abnormal weight loss 03/29/2015  . Muscle weakness (generalized) 03/14/2015  . Headache, migraine 03/10/2015   Cleophus Molt DPT, ATC Cleophus Molt 09/02/2019, 12:55 PM  [facility] MAIN [facility] SERVICES [address] [place], [place], [address] Phone: [phone]   Fax:  [phone]  Name: Christine Chavez MRN: [id number] Date of Birth:  05-09-1980

## 2019-09-02 NOTE — Patient Instructions (Signed)
  Posturific brace

## 2019-09-04 ENCOUNTER — Other Ambulatory Visit: Payer: Self-pay

## 2019-09-04 ENCOUNTER — Ambulatory Visit: Payer: BC Managed Care – PPO

## 2019-09-04 DIAGNOSIS — R262 Difficulty in walking, not elsewhere classified: Secondary | ICD-10-CM

## 2019-09-04 DIAGNOSIS — M6281 Muscle weakness (generalized): Secondary | ICD-10-CM

## 2019-09-04 DIAGNOSIS — M62838 Other muscle spasm: Secondary | ICD-10-CM

## 2019-09-04 NOTE — Therapy (Signed)
[facility] MAIN [facility] SERVICES [address] [place], [place], [address] Phone: [phone]   Fax:  [phone]  Physical Therapy Treatment  The patient has been informed of current processes in place at Outpatient Rehab to protect patients from Covid-19 exposure including social distancing, schedule modifications, and new cleaning procedures. After discussing their particular risk with a therapist based on the patient's personal risk factors, the patient has decided to proceed with in-person therapy.   Patient Details  Name: Christine Chavez MRN: [id number] Date of Birth: [birth year] Referring Provider (PT): [name]   Encounter Date: 09/04/2019  PT End of Session - 09/04/19 1406    Visit Number  37    Number of Visits  53    Date for PT Re-Evaluation  10/23/19    Authorization Type  [employer]    Authorization Time Period  from 08/14/2019 through 10/23/2019    Authorization - Visit Number  5    Authorization - Number of Visits  20    Progress Note Due on Visit  43    PT Start Time  1100    PT Stop Time  1200    PT Time Calculation (min)  60 min    Activity Tolerance  Patient tolerated treatment well;No increased pain    Behavior During Therapy  WFL for tasks assessed/performed       Past Medical History:  Diagnosis Date  . Complication of anesthesia    ? seizures after anesthesia   . Headache   . Migraines   . Neurogenic bladder   . Renal disorder   . Vision abnormalities     Past Surgical History:  Procedure Laterality Date  . ANTERIOR CRUCIATE LIGAMENT REPAIR  1997  . APPENDECTOMY    . COLONOSCOPY WITH PROPOFOL N/A 11/11/2018   Procedure: COLONOSCOPY WITH PROPOFOL;  Surgeon: Christena Deem, MD;  Location: [facility] ENDOSCOPY;  Service: Endoscopy;  Laterality: N/A;  . CYSTOSCOPY WITH STENT PLACEMENT Right 04/17/2016   Procedure: CYSTOSCOPY WITH STENT PLACEMENT;  Surgeon: Malen Gauze, MD;  Location: [facility] ORS;   Service: Urology;  Laterality: Right;  . ESOPHAGOGASTRODUODENOSCOPY (EGD) WITH PROPOFOL N/A 11/11/2018   Procedure: ESOPHAGOGASTRODUODENOSCOPY (EGD) WITH PROPOFOL;  Surgeon: Christena Deem, MD;  Location: [facility] ENDOSCOPY;  Service: Endoscopy;  Laterality: N/A;  . EXPLORATORY LAPAROTOMY  1999  . KIDNEY STONE SURGERY  04/2016  . REVISION UROSTOMY CUTANEOUS    . REVISION UROSTOMY CUTANEOUS  01/10/2018  . SUPRAPUBIC CATHETER PLACEMENT  08/2017  . TONSILLECTOMY      There were no vitals filed for this visit.   Pelvic Floor Physical Therapy Treatment Note  SCREENING  Changes in medications, allergies, or medical history?: She is going to take 7 more days of antibiotic  SUBJECTIVE  Patient reports: She is going to see her urostomy Surgeon and going to have another urodynamics study early June. Having some LBP from her period starting yesterday but feels the flank pain is decreased some from last session. Has had pressure sensation in pelvic area still even though she is not having "pain" in the same way.  Is having to pee frequently and even get up 2-3 times at night because of an urge since she has been increasing her water intake. Drinking 3-4 20oz. Bottles of water and has been drinking some lemonade and tea.   Has not been able to make the kefir yet. Has been taking azo daily bladder health supplement.  Precautions:  Neurological diagnosis pending,  has "non-specific" brain lesions   Pain update:  Location of pain: L Flank pain (pelvic area) Current pain:  4/10 (3/10, increases with motion) Max pain:  7/10 (4/10 when it spasms) Least pain:  2/10 (2/10) Nature of pain: sharp to dull ache  ** pain unchanged following treatment.  Patient Goals: Get rid of the discomfort with catheterizing and ache/bladder pain. Be able to take fewer medications and be able to enjoy spending time with her family again, doing "all the mom things"    OBJECTIVE  Changes  in:  Observations/Posture:  L lumbar curve prominent with spinous processes more prominent on the L.  Range of Motion/Flexibilty:  (from 12/10) Spine: ~ 2-3 fingers from knee with SB B. ~ 50% reduced R rotation with stretch in L, ~ 75% reduced L rotation with increased tightness. Forward bend: 19 in.from floor. Hips: Unable to achieve neutral pelvis in long-sitting due to hamstring tightness.   Strength/MMT:  LE MMT (from 12-10) LE MMT Left Right  Hip flex:  (L2) /5 /5  Hip ext: 3+/5 3+/5  Hip abd: 4/5 4/5  Hip add: 4/5 3+/5  Hip IR 4/5 3+/5  Hip ER 4/5 4/5   (From 3/24): Pt. Demonstrates 3/5 strength with "I's, Y's, and T's" but 4/5 with W's.  Balance/stability: Pt. Able to maintain upright posture with no UE support and slow deliberate perturbations for ~ 2 min with increasing stability and 1 min. With increased speed of direction change though greater variation from the center occurred and it took longer for correction and improved by ~ 50% over the time performed. (from prior visit)  Pelvic Floor External Exam: (From prior session)- [Introitus Appears: elevated Skin integrity: WNL Palpation: TTP to B STP Cough: limited motion Prolapse visible?: no Scar mobility: not assessed  Internal Vaginal Exam: Strength (PERF): Pt. Has difficulty sensing the PFM to generate contraction or relaxation due to high tone/neuro involvement Symmetry: L>R for spasm/TTP Palpation: TTP to all muscles throughout B Prolapse: none]  Abdominal:  Pt. Able to recruit TA and glutes appropriately, reaches fatigue with posterior pelvic tilt in hook-lying at around 10 reps.  Palpation: TTP to L lumbar erector spinae and multifidus.    Gait Analysis:  INTERVENTIONS THIS SESSION:  Manual: Performed TP release to L lumbar erector spinae and multifidus as well as UPA mobs to transverse processes and spinous processes to minimize curve, decrease spasm and pain and allow for improved balance of  musculature for improved function and decreased symptoms.  Therex: educated on and practiced posterior pelvic tilts with emphasis on deep-core coordination and awareness of PFM motion to improve strength of muscles opposing tight musculature to allow reciprocal inhibition to improve balance of musculature surrounding the pelvis and improve overall posture for optimal musculature length-tension relationship and function.   Total time: 60 min.                            PT Short Term Goals - 08/14/19 1031      PT SHORT TERM GOAL #1   Title  Patient will demonstrate coordinated diaphragmatic breathing with pelvic tilts to demonstrate improved control of diaphragm and TA, to allow for further strengthening of core musculature and decreased pelvic floor spasm.    Baseline  Pt. demonstrates breathing dysfunction and poor PFM coordination evidenced by anal manometry As of 2/22: Pt. continues to have restriction in her diaphragm and near T/L junction but is able to intentionally use diaphragmatic  breathing through the available ROM.    Time  5    Period  Weeks    Status  Achieved    Target Date  04/20/19      PT SHORT TERM GOAL #2   Title  Patient will report a reduction in pain to no greater than 6/10 over the prior week to demonstrate symptom improvement.    Baseline  Pain is 10/10 at worst, 2/10 at best As of 2/22: Pt. had achieved this goal for 1 week as of 1/21 but may have infection or other insult that caused pain to increase again. As of 4/29: Pain high of 8/10 but has been over-doing her activity over the past week.    Time  5    Period  Weeks    Status  On-going    Target Date  10/23/19      PT SHORT TERM GOAL #3   Title  Patient will demonstrate HEP x1 in the clinic to demonstrate understanding and proper form to allow for further improvement.    Baseline  Pt. lacks knowledge of therepeutic exercises that can decrease her pain/Sx.    Time  5    Period  Weeks     Status  Achieved    Target Date  04/20/19      PT SHORT TERM GOAL #4   Title  Patient will report consistent use of foot-stool (squatty-potty) for positioning with BM to decrease pain with BM and intra-abdominal pressure.    Baseline  Pt. having constipation due to PFM dysfunction    Time  5    Period  Weeks    Status  Achieved    Target Date  04/20/19        PT Long Term Goals - 08/14/19 0001      PT LONG TERM GOAL #1   Title  Pt. will be able to participate in regular ADL's with least restrictive device without pain increasing greater than 2/10    Baseline  Pt. limited in her ability to perform household duties by increased pain and fatigue. As of 4/29: Her legs have more endurance with walking before she gets a tremor. Still needs to sit down the majority of the time she is cooking. Still needs back support for prolonged sitting.    Time  10    Period  Weeks    Status  On-going    Target Date  10/23/19      PT LONG TERM GOAL #2   Title  Patient will score at or below 65/300  on the PFDI and 35% on the Female NIH-CPSI to demonstrate a clinically meaningful decrease in disability and distress due to pelvic floor dysfunction.    Baseline  PFDI: 110/300, Female NIH-CPSI: 29/43 (67%) As of 4/29: 100/300 on PFDI and 30/43 on NIH-CPSI    Time  10    Period  Weeks    Status  Unable to assess    Target Date  10/23/19      PT LONG TERM GOAL #3   Title  Patient will report no pain with intercourse to demonstrate improved functional ability.    Baseline  Pt. Having significant pain with intercourse. As of 4/29: is a little better (20-30%) but is worse on days where overall tension/pain is worse.    Time  10    Period  Weeks    Status  On-going    Target Date  10/23/19      PT LONG TERM  GOAL #4   Title  Pt will report ability to work in her yard for greater than 30 minutes without extreme fatigue    Baseline  limited to 10-15 minutes before pt requires ending activity. As of 4/29:  ~ 20 min.    Time  10    Period  Weeks    Status  On-going    Target Date  10/23/19      PT LONG TERM GOAL #5   Title  Pt. will be able to go 2-3 hours between emptying her bladder without increased pain and empty her bladder fully whether by urostomy, cath, or voluntary release.    Baseline  Pt. has a urostomy but continues to have to self-catheterize. Has pain with bladder filling and when using catheter. As of 4/29: is doing better since starting to take the pregabalin andd baclofen but occasionally still has pain with bladder filling. It is bad when she has an infection or over-does activity, better on "normal" days which have been few and far between due to frequent UTI's    Time  10    Period  Weeks    Status  On-going    Target Date  10/23/19      PT LONG TERM GOAL #6   Title  Patient will report having BM's at least every-other day with consistency between [facility] stool scale 3-5 over the prior week to demonstrate decreased constipation.    Baseline  Pt. unable to have regula BM's without medication, manometry shows PFM dysfunction. As of 4/29: still having to use and enema or supository occasionally and still needing linzess     Time  10    Period  Weeks    Status  On-going    Target Date  10/23/19            Plan - 09/04/19 1407    Clinical Impression Statement  Pt. Responded well to all interventions today, demonstrating decreased spasm, improved spinal mobility and alignment, deep-core recruitment, as well as understanding and correct performance of all education and exercises provided today. They will continue to benefit from skilled physical therapy to work toward remaining goals and maximize function as well as decrease likelihood of symptom increase or recurrence.     PT Next Visit Plan  focus on graded activity, PFM TP release and seated and standing balance/core strength. TPDN with tens at T/L junction review I's Y's and T's in prone, thoracic mobility? perform  further release and PNF for R>L shoulder blades, rotational mobilizations through T-L junction, more scar-release and cupping at suprapubic scar, MFR around B ilium. re-assess ROM/back pain. TPDN to multifidus through T-L  junction with E-stim at 40hz  and at 2 hz. continue to work on thoracolumbar rotation ROM and decreasing spasms, add SLS balance training, begin strengthening for reciprocal inhibition. Internal pelvic exam,    PT Home Exercise Plan  diaphragmatic breathing, seated posture (lumbar support and avoiding adduction, low back stretch, side-stretch, hamstring stretch, butterfly stretch, bow-and-arrow, TENS trial, squatty potty, bowel retraining, toileting tips, colonic massage, thoracic extensions and supine chest stretch with towel roll, hip EXT in prone with knee bent, side-lying hip ABD, child's pose., self internal TP release, I's, Y's, T's, and W's, self TP release to neck/shoulder area, limits of chronic pain/chronic fatigue and moderating activity level, pain/fatigue diary. Posterior pelvic tilts with deep core activation.    Consulted and Agree with Plan of Care  Patient       Patient will benefit from skilled  therapeutic intervention in order to improve the following deficits and impairments:     Visit Diagnosis: Other muscle spasm  Muscle weakness (generalized)  Difficulty in walking, not elsewhere classified     Problem List Patient Active Problem List   Diagnosis Date Noted  . Seizure (HCC) 11/11/2018  . Major depressive disorder, recurrent episode, moderate (HCC) 02/07/2018  . Nephrolithiasis 04/16/2016  . Numbness 07/28/2015  . Bladder retention 06/23/2015  . Abdominal pain 06/04/2015  . Dizziness 05/18/2015  . Neck pain 05/18/2015  . Complicated migraine 04/28/2015  . Other fatigue 04/28/2015  . Abnormal finding on MRI of brain 04/28/2015  . D (diarrhea) 03/29/2015  . H/O disease 03/29/2015  . Abnormal weight loss 03/29/2015  . Muscle weakness  (generalized) 03/14/2015  . Headache, migraine 03/10/2015   Cleophus Molt DPT, ATC Cleophus Molt 09/04/2019, 2:28 PM  [place] [facility] MAIN [facility] SERVICES [address] [place], [place], [address] Phone: [phone]   Fax:  [phone]  Name: Christine Chavez MRN: [id number] Date of Birth: 05-18-1980

## 2019-09-04 NOTE — Patient Instructions (Signed)
Pelvic Tilt With Pelvic Floor (Hook-Lying)        Lie with hips and knees bent. Squeeze pelvic floor and flatten low back while breathing out so that pelvis tilts. Repeat _10x3__ times. Do __1_ times a day.

## 2019-09-08 ENCOUNTER — Ambulatory Visit: Payer: BC Managed Care – PPO

## 2019-09-08 ENCOUNTER — Other Ambulatory Visit: Payer: Self-pay

## 2019-09-08 DIAGNOSIS — M62838 Other muscle spasm: Secondary | ICD-10-CM | POA: Diagnosis not present

## 2019-09-08 DIAGNOSIS — R262 Difficulty in walking, not elsewhere classified: Secondary | ICD-10-CM

## 2019-09-08 DIAGNOSIS — M6281 Muscle weakness (generalized): Secondary | ICD-10-CM

## 2019-09-08 NOTE — Therapy (Signed)
[place] [facility] MAIN [facility] SERVICES [address] [place], [place], [address] Phone: [phone]   Fax:  [phone]  Physical Therapy Treatment  The patient has been informed of current processes in place at Outpatient Rehab to protect patients from Covid-19 exposure including social distancing, schedule modifications, and new cleaning procedures. After discussing their particular risk with a therapist based on the patient's personal risk factors, the patient has decided to proceed with in-person therapy.  Patient Details  Name: Christine Chavez MRN: [id number] Date of Birth: 12/08/1980 Referring Provider (PT): [name]   Encounter Date: 09/08/2019  PT End of Session - 09/08/19 1714    Visit Number  38    Number of Visits  53    Date for PT Re-Evaluation  10/23/19    Authorization Type  [employer]    Authorization Time Period  from 08/14/2019 through 10/23/2019    Authorization - Visit Number  6    Authorization - Number of Visits  20    Progress Note Due on Visit  43    PT Start Time  1509    PT Stop Time  1609    PT Time Calculation (min)  60 min    Activity Tolerance  Patient tolerated treatment well;No increased pain    Behavior During Therapy  WFL for tasks assessed/performed       Past Medical History:  Diagnosis Date  . Complication of anesthesia    ? seizures after anesthesia   . Headache   . Migraines   . Neurogenic bladder   . Renal disorder   . Vision abnormalities     Past Surgical History:  Procedure Laterality Date  . ANTERIOR CRUCIATE LIGAMENT REPAIR  1997  . APPENDECTOMY    . COLONOSCOPY WITH PROPOFOL N/A 11/11/2018   Procedure: COLONOSCOPY WITH PROPOFOL;  Surgeon: Christena Deem, MD;  Location: [facility] ENDOSCOPY;  Service: Endoscopy;  Laterality: N/A;  . CYSTOSCOPY WITH STENT PLACEMENT Right 04/17/2016   Procedure: CYSTOSCOPY WITH STENT PLACEMENT;  Surgeon: Malen Gauze, MD;  Location: [facility] ORS;   Service: Urology;  Laterality: Right;  . ESOPHAGOGASTRODUODENOSCOPY (EGD) WITH PROPOFOL N/A 11/11/2018   Procedure: ESOPHAGOGASTRODUODENOSCOPY (EGD) WITH PROPOFOL;  Surgeon: Christena Deem, MD;  Location: [facility] ENDOSCOPY;  Service: Endoscopy;  Laterality: N/A;  . EXPLORATORY LAPAROTOMY  1999  . KIDNEY STONE SURGERY  04/2016  . REVISION UROSTOMY CUTANEOUS    . REVISION UROSTOMY CUTANEOUS  01/10/2018  . SUPRAPUBIC CATHETER PLACEMENT  08/2017  . TONSILLECTOMY      There were no vitals filed for this visit.  Pelvic Floor Physical Therapy Treatment Note  SCREENING  Changes in medications, allergies, or medical history?: UTI seems to be responding to meds.  SUBJECTIVE  Patient reports: Flank pain is a lot better, Urine looks a lot better, not going to take the extra week of antibiotic after all. Intercourse is not as bad as before the nerve block but still not great. "I think that is where the discomfort comes in"  Precautions:  Neurological diagnosis pending, has "non-specific" brain lesions   Pain update:  Location of pain: L Flank pain (pelvic area) Current pain:  2/10 (2/10, increases with motion) Max pain:  4/10 (6/10 when it spasms) Least pain:  0/10 (2/10) Nature of pain: sharp to dull ache  **no increased pain following treatment.   Patient Goals: Get rid of the discomfort with catheterizing and ache/bladder pain. Be able to take fewer medications and be able  to enjoy spending time with her family again, doing "all the mom things"    OBJECTIVE  Changes in:  Observations/Posture:  L lumbar curve prominent with spinous processes more prominent on the L.  Range of Motion/Flexibilty:  (from 12/10) Spine: ~ 2-3 fingers from knee with SB B. ~ 50% reduced R rotation with stretch in L, ~ 75% reduced L rotation with increased tightness. Forward bend: 19 in.from floor. Hips: Unable to achieve neutral pelvis in long-sitting due to hamstring tightness.   Strength/MMT:   LE MMT (from 12-10) LE MMT Left Right  Hip flex:  (L2) /5 /5  Hip ext: 3+/5 3+/5  Hip abd: 4/5 4/5  Hip add: 4/5 3+/5  Hip IR 4/5 3+/5  Hip ER 4/5 4/5   (From 3/24): Pt. Demonstrates 3/5 strength with "I's, Y's, and T's" but 4/5 with W's.  Balance/stability: Pt. Able to maintain upright posture with no UE support and slow deliberate perturbations for ~ 2 min with increasing stability and 1 min. With increased speed of direction change though greater variation from the center occurred and it took longer for correction and improved by ~ 50% over the time performed. (from prior visit)  Pelvic Floor External Exam: (From prior session)- [Introitus Appears: elevated Skin integrity: WNL Palpation: TTP to B STP Cough: limited motion Prolapse visible?: no Scar mobility: not assessed  Internal Vaginal Exam: Strength (PERF): Pt. Has difficulty sensing the PFM to generate contraction or relaxation due to high tone/neuro involvement Symmetry: L>R for spasm/TTP Palpation: TTP to all muscles throughout B Prolapse: none]  Today: TTP to L PR/PC/IC and coccygeus   Abdominal:  Pt. Able to recruit TA and glutes appropriately, reaches fatigue with posterior pelvic tilt in hook-lying at around 10 reps.  Palpation:   Gait Analysis:  INTERVENTIONS THIS SESSION:  Manual: Performed TP release to Posertior PR/PC/IC and coccygeus on L to decrease spasm and pain and allow for more PFM relaxation and ROM for improved function and decreased symptoms.   Total time: 60 min.                              PT Short Term Goals - 08/14/19 1031      PT SHORT TERM GOAL #1   Title  Patient will demonstrate coordinated diaphragmatic breathing with pelvic tilts to demonstrate improved control of diaphragm and TA, to allow for further strengthening of core musculature and decreased pelvic floor spasm.    Baseline  Pt. demonstrates breathing dysfunction and poor PFM coordination  evidenced by anal manometry As of 2/22: Pt. continues to have restriction in her diaphragm and near T/L junction but is able to intentionally use diaphragmatic breathing through the available ROM.    Time  5    Period  Weeks    Status  Achieved    Target Date  04/20/19      PT SHORT TERM GOAL #2   Title  Patient will report a reduction in pain to no greater than 6/10 over the prior week to demonstrate symptom improvement.    Baseline  Pain is 10/10 at worst, 2/10 at best As of 2/22: Pt. had achieved this goal for 1 week as of 1/21 but may have infection or other insult that caused pain to increase again. As of 4/29: Pain high of 8/10 but has been over-doing her activity over the past week.    Time  5    Period  Weeks  Status  On-going    Target Date  10/23/19      PT SHORT TERM GOAL #3   Title  Patient will demonstrate HEP x1 in the clinic to demonstrate understanding and proper form to allow for further improvement.    Baseline  Pt. lacks knowledge of therepeutic exercises that can decrease her pain/Sx.    Time  5    Period  Weeks    Status  Achieved    Target Date  04/20/19      PT SHORT TERM GOAL #4   Title  Patient will report consistent use of foot-stool (squatty-potty) for positioning with BM to decrease pain with BM and intra-abdominal pressure.    Baseline  Pt. having constipation due to PFM dysfunction    Time  5    Period  Weeks    Status  Achieved    Target Date  04/20/19        PT Long Term Goals - 08/14/19 0001      PT LONG TERM GOAL #1   Title  Pt. will be able to participate in regular ADL's with least restrictive device without pain increasing greater than 2/10    Baseline  Pt. limited in her ability to perform household duties by increased pain and fatigue. As of 4/29: Her legs have more endurance with walking before she gets a tremor. Still needs to sit down the majority of the time she is cooking. Still needs back support for prolonged sitting.    Time  10     Period  Weeks    Status  On-going    Target Date  10/23/19      PT LONG TERM GOAL #2   Title  Patient will score at or below 65/300  on the PFDI and 35% on the Female NIH-CPSI to demonstrate a clinically meaningful decrease in disability and distress due to pelvic floor dysfunction.    Baseline  PFDI: 110/300, Female NIH-CPSI: 29/43 (67%) As of 4/29: 100/300 on PFDI and 30/43 on NIH-CPSI    Time  10    Period  Weeks    Status  Unable to assess    Target Date  10/23/19      PT LONG TERM GOAL #3   Title  Patient will report no pain with intercourse to demonstrate improved functional ability.    Baseline  Pt. Having significant pain with intercourse. As of 4/29: is a little better (20-30%) but is worse on days where overall tension/pain is worse.    Time  10    Period  Weeks    Status  On-going    Target Date  10/23/19      PT LONG TERM GOAL #4   Title  Pt will report ability to work in her yard for greater than 30 minutes without extreme fatigue    Baseline  limited to 10-15 minutes before pt requires ending activity. As of 4/29: ~ 20 min.    Time  10    Period  Weeks    Status  On-going    Target Date  10/23/19      PT LONG TERM GOAL #5   Title  Pt. will be able to go 2-3 hours between emptying her bladder without increased pain and empty her bladder fully whether by urostomy, cath, or voluntary release.    Baseline  Pt. has a urostomy but continues to have to self-catheterize. Has pain with bladder filling and when using catheter. As of 4/29:  is doing better since starting to take the pregabalin andd baclofen but occasionally still has pain with bladder filling. It is bad when she has an infection or over-does activity, better on "normal" days which have been few and far between due to frequent UTI's    Time  10    Period  Weeks    Status  On-going    Target Date  10/23/19      PT LONG TERM GOAL #6   Title  Patient will report having BM's at least every-other day with  consistency between [facility] stool scale 3-5 over the prior week to demonstrate decreased constipation.    Baseline  Pt. unable to have regula BM's without medication, manometry shows PFM dysfunction. As of 4/29: still having to use and enema or supository occasionally and still needing linzess     Time  10    Period  Weeks    Status  On-going    Target Date  10/23/19            Plan - 09/08/19 1715    Clinical Impression Statement  Pt. Responded well to all interventions today, demonstrating improved relaxation of the PFM on the L>R as well as understanding and correct performance of all education and exercises provided today. They will continue to benefit from skilled physical therapy to work toward remaining goals and maximize function as well as decrease likelihood of symptom increase or recurrence.     PT Next Visit Plan  PFM TP release on R and anteriorly B, focus on graded activity, and seated and standing balance/core strength. TPDN with tens at T/L junction review I's Y's and T's in prone, thoracic mobility? perform further release and PNF for R>L shoulder blades, rotational mobilizations through T-L junction, more scar-release and cupping at suprapubic scar, MFR around B ilium. re-assess ROM/back pain. TPDN to multifidus through T-L  junction with E-stim at 40hz  and at 2 hz. continue to work on thoracolumbar rotation ROM and decreasing spasms, add SLS balance training, begin strengthening for reciprocal inhibition. Internal pelvic exam,    PT Home Exercise Plan  diaphragmatic breathing, seated posture (lumbar support and avoiding adduction, low back stretch, side-stretch, hamstring stretch, butterfly stretch, bow-and-arrow, TENS trial, squatty potty, bowel retraining, toileting tips, colonic massage, thoracic extensions and supine chest stretch with towel roll, hip EXT in prone with knee bent, side-lying hip ABD, child's pose., self internal TP release, I's, Y's, T's, and W's, self TP  release to neck/shoulder area, limits of chronic pain/chronic fatigue and moderating activity level, pain/fatigue diary. Posterior pelvic tilts with deep core activation.    Consulted and Agree with Plan of Care  Patient       Patient will benefit from skilled therapeutic intervention in order to improve the following deficits and impairments:     Visit Diagnosis: Other muscle spasm  Muscle weakness (generalized)  Difficulty in walking, not elsewhere classified     Problem List Patient Active Problem List   Diagnosis Date Noted  . Seizure (HCC) 11/11/2018  . Major depressive disorder, recurrent episode, moderate (HCC) 02/07/2018  . Nephrolithiasis 04/16/2016  . Numbness 07/28/2015  . Bladder retention 06/23/2015  . Abdominal pain 06/04/2015  . Dizziness 05/18/2015  . Neck pain 05/18/2015  . Complicated migraine 04/28/2015  . Other fatigue 04/28/2015  . Abnormal finding on MRI of brain 04/28/2015  . D (diarrhea) 03/29/2015  . H/O disease 03/29/2015  . Abnormal weight loss 03/29/2015  . Muscle weakness (generalized) 03/14/2015  . Headache,  migraine 03/10/2015   Cleophus Molt DPT, ATC Cleophus Molt 09/08/2019, 5:16 PM  [facility] MAIN [facility] SERVICES [address] [place], [place], [address] Phone: [phone]   Fax:  [phone]  Name: Christine Chavez MRN: [id number] Date of Birth: January 29, 1981

## 2019-09-11 ENCOUNTER — Other Ambulatory Visit: Payer: Self-pay

## 2019-09-11 ENCOUNTER — Ambulatory Visit: Payer: BC Managed Care – PPO

## 2019-09-11 DIAGNOSIS — M62838 Other muscle spasm: Secondary | ICD-10-CM | POA: Diagnosis not present

## 2019-09-11 DIAGNOSIS — M6281 Muscle weakness (generalized): Secondary | ICD-10-CM

## 2019-09-11 DIAGNOSIS — R262 Difficulty in walking, not elsewhere classified: Secondary | ICD-10-CM

## 2019-09-11 NOTE — Therapy (Signed)
[facility] MAIN [facility] SERVICES [address] [place], [place], [address] Phone: [phone]   Fax:  [phone]  Physical Therapy Treatment  The patient has been informed of current processes in place at Outpatient Rehab to protect patients from Covid-19 exposure including social distancing, schedule modifications, and new cleaning procedures. After discussing their particular risk with a therapist based on the patient's personal risk factors, the patient has decided to proceed with in-person therapy.   Patient Details  Name: Christine Chavez MRN: [id number] Date of Birth: [birth year] Referring Provider (PT): [name]   Encounter Date: 09/11/2019  PT End of Session - 09/11/19 0952    Visit Number  39    Number of Visits  53    Date for PT Re-Evaluation  10/23/19    Authorization Type  [employer]    Authorization Time Period  from 08/14/2019 through 10/23/2019    Authorization - Visit Number  7    Authorization - Number of Visits  20    Progress Note Due on Visit  43    PT Start Time  0955    PT Stop Time  1055    PT Time Calculation (min)  60 min    Activity Tolerance  Patient tolerated treatment well;No increased pain    Behavior During Therapy  WFL for tasks assessed/performed       Past Medical History:  Diagnosis Date  . Complication of anesthesia    ? seizures after anesthesia   . Headache   . Migraines   . Neurogenic bladder   . Renal disorder   . Vision abnormalities     Past Surgical History:  Procedure Laterality Date  . ANTERIOR CRUCIATE LIGAMENT REPAIR  1997  . APPENDECTOMY    . COLONOSCOPY WITH PROPOFOL N/A 11/11/2018   Procedure: COLONOSCOPY WITH PROPOFOL;  Surgeon: Christena Deem, MD;  Location: [facility] ENDOSCOPY;  Service: Endoscopy;  Laterality: N/A;  . CYSTOSCOPY WITH STENT PLACEMENT Right 04/17/2016   Procedure: CYSTOSCOPY WITH STENT PLACEMENT;  Surgeon: Malen Gauze, MD;  Location: [facility] ORS;   Service: Urology;  Laterality: Right;  . ESOPHAGOGASTRODUODENOSCOPY (EGD) WITH PROPOFOL N/A 11/11/2018   Procedure: ESOPHAGOGASTRODUODENOSCOPY (EGD) WITH PROPOFOL;  Surgeon: Christena Deem, MD;  Location: [facility] ENDOSCOPY;  Service: Endoscopy;  Laterality: N/A;  . EXPLORATORY LAPAROTOMY  1999  . KIDNEY STONE SURGERY  04/2016  . REVISION UROSTOMY CUTANEOUS    . REVISION UROSTOMY CUTANEOUS  01/10/2018  . SUPRAPUBIC CATHETER PLACEMENT  08/2017  . TONSILLECTOMY      There were no vitals filed for this visit.  Pelvic Floor Physical Therapy Treatment Note  SCREENING  Changes in medications, allergies, or medical history?: UTI seems to be responding to meds.  SUBJECTIVE  Patient reports: Is doing ok, just dragging a little bit, not hurting a lot.   Precautions:  Neurological diagnosis pending, has "non-specific" brain lesions   Pain update:  Location of pain: L Flank pain (pelvic area) Current pain:  2/10 (2/10, increases with motion) Max pain:  3/10 (5/10 when it spasms) Least pain:  0/10 (2/10) Nature of pain: sharp to dull ache  **no increased pain following treatment.   Patient Goals: Get rid of the discomfort with catheterizing and ache/bladder pain. Be able to take fewer medications and be able to enjoy spending time with her family again, doing "all the mom things"    OBJECTIVE  Changes in:  Observations/Posture:  L lumbar curve prominent with spinous processes more  prominent on the L. (from previous note)  Range of Motion/Flexibilty:  (from 12/10) Spine: ~ 2-3 fingers from knee with SB B. ~ 50% reduced R rotation with stretch in L, ~ 75% reduced L rotation with increased tightness. Forward bend: 19 in.from floor. Hips: Unable to achieve neutral pelvis in long-sitting due to hamstring tightness.   Strength/MMT:  LE MMT (from 12-10) LE MMT Left Right  Hip flex:  (L2) /5 /5  Hip ext: 3+/5 3+/5  Hip abd: 4/5 4/5  Hip add: 4/5 3+/5  Hip IR 4/5 3+/5  Hip ER  4/5 4/5   (From 3/24): Pt. Demonstrates 3/5 strength with "I's, Y's, and T's" but 4/5 with W's.  Balance/stability: Pt. Able to maintain upright posture with no UE support and slow deliberate perturbations for ~ 2 min with increasing stability and 1 min. With increased speed of direction change though greater variation from the center occurred and it took longer for correction and improved by ~ 50% over the time performed. (from prior visit)  Pelvic Floor External Exam: (From prior session)- [Introitus Appears: elevated Skin integrity: WNL Palpation: TTP to B STP Cough: limited motion Prolapse visible?: no Scar mobility: not assessed  Internal Vaginal Exam: Strength (PERF): Pt. Has difficulty sensing the PFM to generate contraction or relaxation due to high tone/neuro involvement Symmetry: L>R for spasm/TTP Palpation: TTP to all muscles throughout B Prolapse: none]  Today: TTP to R PR/PC/IC and OI   Abdominal:  Pt. Able to recruit TA and glutes appropriately, reaches fatigue with posterior pelvic tilt in hook-lying at around 10 reps.  Palpation:   Gait Analysis:  INTERVENTIONS THIS SESSION:  Manual: Performed TP release to R PR/PC/IC and OI to decrease spasm and pain and allow for more PFM relaxation and ROM for improved function and decreased symptoms.   Total time: 60 min.                                PT Short Term Goals - 08/14/19 1031      PT SHORT TERM GOAL #1   Title  Patient will demonstrate coordinated diaphragmatic breathing with pelvic tilts to demonstrate improved control of diaphragm and TA, to allow for further strengthening of core musculature and decreased pelvic floor spasm.    Baseline  Pt. demonstrates breathing dysfunction and poor PFM coordination evidenced by anal manometry As of 2/22: Pt. continues to have restriction in her diaphragm and near T/L junction but is able to intentionally use diaphragmatic breathing through  the available ROM.    Time  5    Period  Weeks    Status  Achieved    Target Date  04/20/19      PT SHORT TERM GOAL #2   Title  Patient will report a reduction in pain to no greater than 6/10 over the prior week to demonstrate symptom improvement.    Baseline  Pain is 10/10 at worst, 2/10 at best As of 2/22: Pt. had achieved this goal for 1 week as of 1/21 but may have infection or other insult that caused pain to increase again. As of 4/29: Pain high of 8/10 but has been over-doing her activity over the past week.    Time  5    Period  Weeks    Status  On-going    Target Date  10/23/19      PT SHORT TERM GOAL #3   Title  Patient  will demonstrate HEP x1 in the clinic to demonstrate understanding and proper form to allow for further improvement.    Baseline  Pt. lacks knowledge of therepeutic exercises that can decrease her pain/Sx.    Time  5    Period  Weeks    Status  Achieved    Target Date  04/20/19      PT SHORT TERM GOAL #4   Title  Patient will report consistent use of foot-stool (squatty-potty) for positioning with BM to decrease pain with BM and intra-abdominal pressure.    Baseline  Pt. having constipation due to PFM dysfunction    Time  5    Period  Weeks    Status  Achieved    Target Date  04/20/19        PT Long Term Goals - 08/14/19 0001      PT LONG TERM GOAL #1   Title  Pt. will be able to participate in regular ADL's with least restrictive device without pain increasing greater than 2/10    Baseline  Pt. limited in her ability to perform household duties by increased pain and fatigue. As of 4/29: Her legs have more endurance with walking before she gets a tremor. Still needs to sit down the majority of the time she is cooking. Still needs back support for prolonged sitting.    Time  10    Period  Weeks    Status  On-going    Target Date  10/23/19      PT LONG TERM GOAL #2   Title  Patient will score at or below 65/300  on the PFDI and 35% on the Female  NIH-CPSI to demonstrate a clinically meaningful decrease in disability and distress due to pelvic floor dysfunction.    Baseline  PFDI: 110/300, Female NIH-CPSI: 29/43 (67%) As of 4/29: 100/300 on PFDI and 30/43 on NIH-CPSI    Time  10    Period  Weeks    Status  Unable to assess    Target Date  10/23/19      PT LONG TERM GOAL #3   Title  Patient will report no pain with intercourse to demonstrate improved functional ability.    Baseline  Pt. Having significant pain with intercourse. As of 4/29: is a little better (20-30%) but is worse on days where overall tension/pain is worse.    Time  10    Period  Weeks    Status  On-going    Target Date  10/23/19      PT LONG TERM GOAL #4   Title  Pt will report ability to work in her yard for greater than 30 minutes without extreme fatigue    Baseline  limited to 10-15 minutes before pt requires ending activity. As of 4/29: ~ 20 min.    Time  10    Period  Weeks    Status  On-going    Target Date  10/23/19      PT LONG TERM GOAL #5   Title  Pt. will be able to go 2-3 hours between emptying her bladder without increased pain and empty her bladder fully whether by urostomy, cath, or voluntary release.    Baseline  Pt. has a urostomy but continues to have to self-catheterize. Has pain with bladder filling and when using catheter. As of 4/29: is doing better since starting to take the pregabalin andd baclofen but occasionally still has pain with bladder filling. It is bad when she has  an infection or over-does activity, better on "normal" days which have been few and far between due to frequent UTI's    Time  10    Period  Weeks    Status  On-going    Target Date  10/23/19      PT LONG TERM GOAL #6   Title  Patient will report having BM's at least every-other day with consistency between [facility] stool scale 3-5 over the prior week to demonstrate decreased constipation.    Baseline  Pt. unable to have regula BM's without medication, manometry shows  PFM dysfunction. As of 4/29: still having to use and enema or supository occasionally and still needing linzess     Time  10    Period  Weeks    Status  On-going    Target Date  10/23/19            Plan - 09/11/19 0953    Clinical Impression Statement  Pt. Responded well to all interventions today, demonstrating improved response to TP release with reduction through all areas on the R except the coccygeus, as well as understanding and correct performance of all education and exercises provided today. They will continue to benefit from skilled physical therapy to work toward remaining goals and maximize function as well as decrease likelihood of symptom increase or recurrence.    PT Next Visit Plan  PFM TP release to B coccygeus and re-check anteriorly and at OI, focus on graded activity, and seated and standing balance/core strength. TPDN with tens at T/L junction review I's Y's and T's in prone, thoracic mobility? perform further release and PNF for R>L shoulder blades, rotational mobilizations through T-L junction, more scar-release and cupping at suprapubic scar, MFR around B ilium. re-assess ROM/back pain. TPDN to multifidus through T-L  junction with E-stim at 40hz  and at 2 hz. continue to work on thoracolumbar rotation ROM and decreasing spasms, add SLS balance training, begin strengthening for reciprocal inhibition. Internal pelvic exam,    PT Home Exercise Plan  diaphragmatic breathing, seated posture (lumbar support and avoiding adduction, low back stretch, side-stretch, hamstring stretch, butterfly stretch, bow-and-arrow, TENS trial, squatty potty, bowel retraining, toileting tips, colonic massage, thoracic extensions and supine chest stretch with towel roll, hip EXT in prone with knee bent, side-lying hip ABD, child's pose., self internal TP release, I's, Y's, T's, and W's, self TP release to neck/shoulder area, limits of chronic pain/chronic fatigue and moderating activity level,  pain/fatigue diary. Posterior pelvic tilts with deep core activation.    Consulted and Agree with Plan of Care  Patient       Patient will benefit from skilled therapeutic intervention in order to improve the following deficits and impairments:     Visit Diagnosis: Other muscle spasm  Muscle weakness (generalized)  Difficulty in walking, not elsewhere classified     Problem List Patient Active Problem List   Diagnosis Date Noted  . Seizure ([place]) 11/11/2018  . Major depressive disorder, recurrent episode, moderate ([place]) 02/07/2018  . Nephrolithiasis 04/16/2016  . Numbness 07/28/2015  . Bladder retention 06/23/2015  . Abdominal pain 06/04/2015  . Dizziness 05/18/2015  . Neck pain 05/18/2015  . Complicated migraine 73/53/2992  . Other fatigue 04/28/2015  . Abnormal finding on MRI of brain 04/28/2015  . D (diarrhea) 03/29/2015  . H/O disease 03/29/2015  . Abnormal weight loss 03/29/2015  . Muscle weakness (generalized) 03/14/2015  . Headache, migraine 03/10/2015   Willa Rough DPT, ATC Willa Rough 09/11/2019, 3:10  PM  [place] [facility] [address] [place], [place], [address] Phone: [phone]   Fax:  [phone]  Name: Porfirio MylarMelissa Dawn Gee MRN: [id number] Date of Birth: [birth year]

## 2019-09-16 ENCOUNTER — Other Ambulatory Visit: Payer: Self-pay

## 2019-09-16 ENCOUNTER — Ambulatory Visit: Payer: BC Managed Care – PPO | Attending: Gastroenterology

## 2019-09-16 DIAGNOSIS — M62838 Other muscle spasm: Secondary | ICD-10-CM | POA: Diagnosis present

## 2019-09-16 DIAGNOSIS — R262 Difficulty in walking, not elsewhere classified: Secondary | ICD-10-CM | POA: Insufficient documentation

## 2019-09-16 DIAGNOSIS — M6281 Muscle weakness (generalized): Secondary | ICD-10-CM

## 2019-09-16 NOTE — Therapy (Signed)
[place] [facility] MAIN [facility] SERVICES [address] [place], [place], [address] Phone: [phone]   Fax:  [phone]  Physical Therapy Treatment  The patient has been informed of current processes in place at Outpatient Rehab to protect patients from Covid-19 exposure including social distancing, schedule modifications, and new cleaning procedures. After discussing their particular risk with a therapist based on the patient's personal risk factors, the patient has decided to proceed with in-person therapy.   Patient Details  Name: Christine Chavez MRN: [id number] Date of Birth: November 20, 1980 Referring Provider (PT): [name]   Encounter Date: 09/16/2019  PT End of Session - 09/16/19 1700    Visit Number  40    Number of Visits  53    Date for PT Re-Evaluation  10/23/19    Authorization Type  [employer]    Authorization Time Period  from 08/14/2019 through 10/23/2019    Authorization - Visit Number  8    Authorization - Number of Visits  20    Progress Note Due on Visit  43    PT Start Time  1400    PT Stop Time  1500    PT Time Calculation (min)  60 min    Activity Tolerance  Patient tolerated treatment well;No increased pain    Behavior During Therapy  WFL for tasks assessed/performed       Past Medical History:  Diagnosis Date  . Complication of anesthesia    ? seizures after anesthesia   . Headache   . Migraines   . Neurogenic bladder   . Renal disorder   . Vision abnormalities     Past Surgical History:  Procedure Laterality Date  . ANTERIOR CRUCIATE LIGAMENT REPAIR  1997  . APPENDECTOMY    . COLONOSCOPY WITH PROPOFOL N/A 11/11/2018   Procedure: COLONOSCOPY WITH PROPOFOL;  Surgeon: Christena Deem, MD;  Location: [facility] ENDOSCOPY;  Service: Endoscopy;  Laterality: N/A;  . CYSTOSCOPY WITH STENT PLACEMENT Right 04/17/2016   Procedure: CYSTOSCOPY WITH STENT PLACEMENT;  Surgeon: Malen Gauze, MD;  Location: [facility] ORS;   Service: Urology;  Laterality: Right;  . ESOPHAGOGASTRODUODENOSCOPY (EGD) WITH PROPOFOL N/A 11/11/2018   Procedure: ESOPHAGOGASTRODUODENOSCOPY (EGD) WITH PROPOFOL;  Surgeon: Christena Deem, MD;  Location: [facility] ENDOSCOPY;  Service: Endoscopy;  Laterality: N/A;  . EXPLORATORY LAPAROTOMY  1999  . KIDNEY STONE SURGERY  04/2016  . REVISION UROSTOMY CUTANEOUS    . REVISION UROSTOMY CUTANEOUS  01/10/2018  . SUPRAPUBIC CATHETER PLACEMENT  08/2017  . TONSILLECTOMY      There were no vitals filed for this visit.  Pelvic Floor Physical Therapy Treatment Note  SCREENING  Changes in medications, allergies, or medical history?: UTI seems to be responding to meds.  SUBJECTIVE  Patient reports: Has been a crazy week with a graduation party and her daughter's birthday party and kids being out of school now, etc. She had a headache on Sunday and some "bladder issues" where she had a hard time getting the catheter in and then could not get much out. She was able to get it out once she got home and was able to cath. Yesterday was better. Admits she probably over-did it on Saturday to make the party special. Had a little twinge of flank pain this morning but it is feeling better now.   Precautions:  Neurological diagnosis pending, has "non-specific" brain lesions   Pain update:  Location of pain: L Flank pain (pelvic area) Current pain:  3/10 (3/10,  increases with motion) Max pain:  6/10 (8/10 when it spasms) Least pain:  2/10 (2/10) Nature of pain: sharp to dull ache  **no increased pain following treatment.   Patient Goals: Get rid of the discomfort with catheterizing and ache/bladder pain. Be able to take fewer medications and be able to enjoy spending time with her family again, doing "all the mom things"    OBJECTIVE  Changes in:  Observations/Posture:  L lumbar curve prominent with spinous processes more prominent on the L. (from previous note)  Range of Motion/Flexibilty:  (from  12/10) Spine: ~ 2-3 fingers from knee with SB B. ~ 50% reduced R rotation with stretch in L, ~ 75% reduced L rotation with increased tightness. Forward bend: 19 in.from floor. Hips: Unable to achieve neutral pelvis in long-sitting due to hamstring tightness.   Strength/MMT:  LE MMT (from 12-10) LE MMT Left Right  Hip flex:  (L2) /5 /5  Hip ext: 3+/5 3+/5  Hip abd: 4/5 4/5  Hip add: 4/5 3+/5  Hip IR 4/5 3+/5  Hip ER 4/5 4/5   (From 3/24): Pt. Demonstrates 3/5 strength with "I's, Y's, and T's" but 4/5 with W's.  Balance/stability: Pt. Able to maintain upright posture with no UE support and slow deliberate perturbations for ~ 2 min with increasing stability and 1 min. With increased speed of direction change though greater variation from the center occurred and it took longer for correction and improved by ~ 50% over the time performed. (from prior visit)  Pelvic Floor External Exam: (From prior session)- [Introitus Appears: elevated Skin integrity: WNL Palpation: TTP to B STP Cough: limited motion Prolapse visible?: no Scar mobility: not assessed  Internal Vaginal Exam: Strength (PERF): Pt. Has difficulty sensing the PFM to generate contraction or relaxation due to high tone/neuro involvement Symmetry: L>R for spasm/TTP Palpation: TTP to all muscles throughout B Prolapse: none]  Today: TTP to B coccygeus and R anterior PR/PC  Abdominal:  Pt. Able to recruit TA and glutes appropriately, reaches fatigue with posterior pelvic tilt in hook-lying at around 10 reps.  Palpation:   Gait Analysis:  INTERVENTIONS THIS SESSION:  Manual: Performed TP release to B coccygeus and R anterior PR/PC to decrease spasm and pain and allow for more PFM relaxation and ROM for improved function and decreased symptoms. Practiced Anti-kegels to improve awareness and control of PFM and encourage lengthening through visualization and breath control.  Therex: Reviewed the importance of doing her  deep-core exercises and child's pose exercises to allow for continued relaxation of PFM between-sessions.   Self-care: Educated on and tried-on abdominal binder/support to increase awareness of core musculature with sitting/stading/walking and allow for the PFM to relax more. Emphasized the importance of not over-doing it due to increased Sx. Caused by increased stress.  Total time: 60 min.                             PT Short Term Goals - 08/14/19 1031      PT SHORT TERM GOAL #1   Title  Patient will demonstrate coordinated diaphragmatic breathing with pelvic tilts to demonstrate improved control of diaphragm and TA, to allow for further strengthening of core musculature and decreased pelvic floor spasm.    Baseline  Pt. demonstrates breathing dysfunction and poor PFM coordination evidenced by anal manometry As of 2/22: Pt. continues to have restriction in her diaphragm and near T/L junction but is able to intentionally use diaphragmatic  breathing through the available ROM.    Time  5    Period  Weeks    Status  Achieved    Target Date  04/20/19      PT SHORT TERM GOAL #2   Title  Patient will report a reduction in pain to no greater than 6/10 over the prior week to demonstrate symptom improvement.    Baseline  Pain is 10/10 at worst, 2/10 at best As of 2/22: Pt. had achieved this goal for 1 week as of 1/21 but may have infection or other insult that caused pain to increase again. As of 4/29: Pain high of 8/10 but has been over-doing her activity over the past week.    Time  5    Period  Weeks    Status  On-going    Target Date  10/23/19      PT SHORT TERM GOAL #3   Title  Patient will demonstrate HEP x1 in the clinic to demonstrate understanding and proper form to allow for further improvement.    Baseline  Pt. lacks knowledge of therepeutic exercises that can decrease her pain/Sx.    Time  5    Period  Weeks    Status  Achieved    Target Date  04/20/19       PT SHORT TERM GOAL #4   Title  Patient will report consistent use of foot-stool (squatty-potty) for positioning with BM to decrease pain with BM and intra-abdominal pressure.    Baseline  Pt. having constipation due to PFM dysfunction    Time  5    Period  Weeks    Status  Achieved    Target Date  04/20/19        PT Long Term Goals - 08/14/19 0001      PT LONG TERM GOAL #1   Title  Pt. will be able to participate in regular ADL's with least restrictive device without pain increasing greater than 2/10    Baseline  Pt. limited in her ability to perform household duties by increased pain and fatigue. As of 4/29: Her legs have more endurance with walking before she gets a tremor. Still needs to sit down the majority of the time she is cooking. Still needs back support for prolonged sitting.    Time  10    Period  Weeks    Status  On-going    Target Date  10/23/19      PT LONG TERM GOAL #2   Title  Patient will score at or below 65/300  on the PFDI and 35% on the Female NIH-CPSI to demonstrate a clinically meaningful decrease in disability and distress due to pelvic floor dysfunction.    Baseline  PFDI: 110/300, Female NIH-CPSI: 29/43 (67%) As of 4/29: 100/300 on PFDI and 30/43 on NIH-CPSI    Time  10    Period  Weeks    Status  Unable to assess    Target Date  10/23/19      PT LONG TERM GOAL #3   Title  Patient will report no pain with intercourse to demonstrate improved functional ability.    Baseline  Pt. Having significant pain with intercourse. As of 4/29: is a little better (20-30%) but is worse on days where overall tension/pain is worse.    Time  10    Period  Weeks    Status  On-going    Target Date  10/23/19      PT LONG TERM  GOAL #4   Title  Pt will report ability to work in her yard for greater than 30 minutes without extreme fatigue    Baseline  limited to 10-15 minutes before pt requires ending activity. As of 4/29: ~ 20 min.    Time  10    Period  Weeks     Status  On-going    Target Date  10/23/19      PT LONG TERM GOAL #5   Title  Pt. will be able to go 2-3 hours between emptying her bladder without increased pain and empty her bladder fully whether by urostomy, cath, or voluntary release.    Baseline  Pt. has a urostomy but continues to have to self-catheterize. Has pain with bladder filling and when using catheter. As of 4/29: is doing better since starting to take the pregabalin andd baclofen but occasionally still has pain with bladder filling. It is bad when she has an infection or over-does activity, better on "normal" days which have been few and far between due to frequent UTI's    Time  10    Period  Weeks    Status  On-going    Target Date  10/23/19      PT LONG TERM GOAL #6   Title  Patient will report having BM's at least every-other day with consistency between [facility] stool scale 3-5 over the prior week to demonstrate decreased constipation.    Baseline  Pt. unable to have regula BM's without medication, manometry shows PFM dysfunction. As of 4/29: still having to use and enema or supository occasionally and still needing linzess     Time  10    Period  Weeks    Status  On-going    Target Date  10/23/19            Plan - 09/16/19 1700    Clinical Impression Statement  Pt. Responded well to all interventions today, demonstrating improved seated and standing endurance with addition of abdominal binder, decreased spasm/TTP internally, increased PFM awareness and recruitment, as well as understanding and correct performance of all education and exercises provided today. They will continue to benefit from skilled physical therapy to work toward remaining goals and maximize function as well as decrease likelihood of symptom increase or recurrence.    PT Next Visit Plan  review HEP and split up into A and B days/priorotize, focus on graded activity, and seated and standing balance/core strength. TPDN with tens at T/L junction review  I's Y's and T's in prone, thoracic mobility? perform further release and PNF for R>L shoulder blades, rotational mobilizations through T-L junction, more scar-release and cupping at suprapubic scar, MFR around B ilium. re-assess ROM/back pain. TPDN to multifidus through T-L  junction with E-stim at 40hz  and at 2 hz. continue to work on thoracolumbar rotation ROM and decreasing spasms, add SLS balance training, begin strengthening for reciprocal inhibition. Internal pelvic exam,    PT Home Exercise Plan  diaphragmatic breathing, seated posture (lumbar support and avoiding adduction, low back stretch, side-stretch, hamstring stretch, butterfly stretch, bow-and-arrow, TENS trial, squatty potty, bowel retraining, toileting tips, colonic massage, thoracic extensions and supine chest stretch with towel roll, hip EXT in prone with knee bent, side-lying hip ABD, child's pose., self internal TP release, I's, Y's, T's, and W's, self TP release to neck/shoulder area, limits of chronic pain/chronic fatigue and moderating activity level, pain/fatigue diary. Posterior pelvic tilts with deep core activation, abdominal support.    Consulted and Agree  with Plan of Care  Patient       Patient will benefit from skilled therapeutic intervention in order to improve the following deficits and impairments:     Visit Diagnosis: Other muscle spasm  Muscle weakness (generalized)  Difficulty in walking, not elsewhere classified     Problem List Patient Active Problem List   Diagnosis Date Noted  . Seizure ([place]) 11/11/2018  . Major depressive disorder, recurrent episode, moderate ([place]) 02/07/2018  . Nephrolithiasis 04/16/2016  . Numbness 07/28/2015  . Bladder retention 06/23/2015  . Abdominal pain 06/04/2015  . Dizziness 05/18/2015  . Neck pain 05/18/2015  . Complicated migraine 17/79/3903  . Other fatigue 04/28/2015  . Abnormal finding on MRI of brain 04/28/2015  . D (diarrhea) 03/29/2015  . H/O disease  03/29/2015  . Abnormal weight loss 03/29/2015  . Muscle weakness (generalized) 03/14/2015  . Headache, migraine 03/10/2015   Willa Rough DPT, ATC Willa Rough 09/16/2019, 5:03 PM  [place] MAIN [facility] SERVICES [address] [place], [place], [address] Phone: [phone]   Fax:  [phone]  Name: Christine Chavez MRN: [id number] Date of Birth: 1981-01-18

## 2019-09-18 ENCOUNTER — Other Ambulatory Visit: Payer: Self-pay

## 2019-09-18 ENCOUNTER — Ambulatory Visit: Payer: BC Managed Care – PPO

## 2019-09-18 DIAGNOSIS — M62838 Other muscle spasm: Secondary | ICD-10-CM | POA: Diagnosis not present

## 2019-09-18 DIAGNOSIS — R262 Difficulty in walking, not elsewhere classified: Secondary | ICD-10-CM

## 2019-09-18 DIAGNOSIS — M6281 Muscle weakness (generalized): Secondary | ICD-10-CM

## 2019-09-18 NOTE — Therapy (Signed)
[facility] MAIN [facility] SERVICES [address] [place], [place], [address] Phone: [phone]   Fax:  [phone]  Physical Therapy Treatment  The patient has been informed of current processes in place at Outpatient Rehab to protect patients from Covid-19 exposure including social distancing, schedule modifications, and new cleaning procedures. After discussing their particular risk with a therapist based on the patient's personal risk factors, the patient has decided to proceed with in-person therapy.   Patient Details  Name: Christine Chavez MRN: [id number] Date of Birth: 1980/10/09 Referring Provider (PT): [name]   Encounter Date: 09/18/2019  PT End of Session - 09/18/19 1329    Visit Number  41    Number of Visits  53    Date for PT Re-Evaluation  10/23/19    Authorization Type  [employer]    Authorization Time Period  from 08/14/2019 through 10/23/2019    Authorization - Visit Number  8    Authorization - Number of Visits  20    Progress Note Due on Visit  43    PT Start Time  1100    PT Stop Time  1200    PT Time Calculation (min)  60 min    Activity Tolerance  Patient tolerated treatment well;No increased pain    Behavior During Therapy  WFL for tasks assessed/performed       Past Medical History:  Diagnosis Date  . Complication of anesthesia    ? seizures after anesthesia   . Headache   . Migraines   . Neurogenic bladder   . Renal disorder   . Vision abnormalities     Past Surgical History:  Procedure Laterality Date  . ANTERIOR CRUCIATE LIGAMENT REPAIR  1997  . APPENDECTOMY    . COLONOSCOPY WITH PROPOFOL N/A 11/11/2018   Procedure: COLONOSCOPY WITH PROPOFOL;  Surgeon: Christena Deem, MD;  Location: [facility] ENDOSCOPY;  Service: Endoscopy;  Laterality: N/A;  . CYSTOSCOPY WITH STENT PLACEMENT Right 04/17/2016   Procedure: CYSTOSCOPY WITH STENT PLACEMENT;  Surgeon: Malen Gauze, MD;  Location: [facility] ORS;   Service: Urology;  Laterality: Right;  . ESOPHAGOGASTRODUODENOSCOPY (EGD) WITH PROPOFOL N/A 11/11/2018   Procedure: ESOPHAGOGASTRODUODENOSCOPY (EGD) WITH PROPOFOL;  Surgeon: Christena Deem, MD;  Location: [facility] ENDOSCOPY;  Service: Endoscopy;  Laterality: N/A;  . EXPLORATORY LAPAROTOMY  1999  . KIDNEY STONE SURGERY  04/2016  . REVISION UROSTOMY CUTANEOUS    . REVISION UROSTOMY CUTANEOUS  01/10/2018  . SUPRAPUBIC CATHETER PLACEMENT  08/2017  . TONSILLECTOMY      There were no vitals filed for this visit.   Pelvic Floor Physical Therapy Treatment Note  SCREENING  Changes in medications, allergies, or medical history?: UTI seems to be responding to meds.  SUBJECTIVE  Patient reports: Is very fatigued, feeling still recovering from over-doing it. Has had some bladder spasms though not as much as she was having before. Has talked to her husband about getting an abdominal binder/band but has not ordered it yet.   Precautions:  Neurological diagnosis pending, has "non-specific" brain lesions   Pain update:  Location of pain: L Flank pain (pelvic area) Current pain:  3/10 (3/10, increases with motion) Max pain:  6/10 (8/10 when it spasms) Least pain:  2/10 (2/10) Nature of pain: sharp to dull ache  **Pt. Demonstrated significant release of posterior PFM but had increased "bladder" spasm following treatment which eased some after emptying the bladder.  Patient Goals: Get rid of the discomfort with catheterizing  and ache/bladder pain. Be able to take fewer medications and be able to enjoy spending time with her family again, doing "all the mom things"    OBJECTIVE  Changes in:  Observations/Posture:  L lumbar curve prominent with spinous processes more prominent on the L. (from previous note)  Range of Motion/Flexibilty:  (from 12/10) Spine: ~ 2-3 fingers from knee with SB B. ~ 50% reduced R rotation with stretch in L, ~ 75% reduced L rotation with increased tightness.  Forward bend: 19 in.from floor. Hips: Unable to achieve neutral pelvis in long-sitting due to hamstring tightness.   Strength/MMT:  LE MMT (from 12-10) LE MMT Left Right  Hip flex:  (L2) /5 /5  Hip ext: 3+/5 3+/5  Hip abd: 4/5 4/5  Hip add: 4/5 3+/5  Hip IR 4/5 3+/5  Hip ER 4/5 4/5   (From 3/24): Pt. Demonstrates 3/5 strength with "I's, Y's, and T's" but 4/5 with W's.  Balance/stability: Pt. Able to maintain upright posture with no UE support and slow deliberate perturbations for ~ 2 min with increasing stability and 1 min. With increased speed of direction change though greater variation from the center occurred and it took longer for correction and improved by ~ 50% over the time performed. (from prior visit)  Pelvic Floor External Exam: (From prior session)- [Introitus Appears: elevated Skin integrity: WNL Palpation: TTP to B STP Cough: limited motion Prolapse visible?: no Scar mobility: not assessed  Internal Vaginal Exam: Strength (PERF): Pt. Has difficulty sensing the PFM to generate contraction or relaxation due to high tone/neuro involvement Symmetry: L>R for spasm/TTP Palpation: TTP to all muscles throughout B Prolapse: none]  Today: TTP at perineum and Pt. Had response throughout PFM with treatment.  Abdominal:  Pt. Able to recruit TA and glutes appropriately, reaches fatigue with posterior pelvic tilt in hook-lying at around 10 reps. (from prior note)  Palpation:  Gait Analysis:  INTERVENTIONS THIS SESSION:  Manual: Performed TP release and STM to perineum to decrease spasm and pain and allow for more PFM relaxation and ROM for improved function and decreased symptoms.   Dry-needle: Performed TPDN with a .30x36mm needle and standard approach as described below to decrease spasm and pain and allow for improved balance of musculature for improved function and decreased symptoms.  Self-care: Instructed Pt. To try heat and stretches as well as drink extra water  and use gentle motion to minimize spasm and pain following treatment.  Total time: 60 min.                      Trigger Point Dry Needling - 09/18/19 0001    Consent Given?  Yes    Education Handout Provided  No    Dry Needling Comments  Centrally at perineum to affect PFM    Other Dry Needling  good release through posterior musculature/palpable area but Pt. continuing to have bladder spasm response at end of session.             PT Short Term Goals - 08/14/19 1031      PT SHORT TERM GOAL #1   Title  Patient will demonstrate coordinated diaphragmatic breathing with pelvic tilts to demonstrate improved control of diaphragm and TA, to allow for further strengthening of core musculature and decreased pelvic floor spasm.    Baseline  Pt. demonstrates breathing dysfunction and poor PFM coordination evidenced by anal manometry As of 2/22: Pt. continues to have restriction in her diaphragm and near T/L junction  but is able to intentionally use diaphragmatic breathing through the available ROM.    Time  5    Period  Weeks    Status  Achieved    Target Date  04/20/19      PT SHORT TERM GOAL #2   Title  Patient will report a reduction in pain to no greater than 6/10 over the prior week to demonstrate symptom improvement.    Baseline  Pain is 10/10 at worst, 2/10 at best As of 2/22: Pt. had achieved this goal for 1 week as of 1/21 but may have infection or other insult that caused pain to increase again. As of 4/29: Pain high of 8/10 but has been over-doing her activity over the past week.    Time  5    Period  Weeks    Status  On-going    Target Date  10/23/19      PT SHORT TERM GOAL #3   Title  Patient will demonstrate HEP x1 in the clinic to demonstrate understanding and proper form to allow for further improvement.    Baseline  Pt. lacks knowledge of therepeutic exercises that can decrease her pain/Sx.    Time  5    Period  Weeks    Status  Achieved    Target  Date  04/20/19      PT SHORT TERM GOAL #4   Title  Patient will report consistent use of foot-stool (squatty-potty) for positioning with BM to decrease pain with BM and intra-abdominal pressure.    Baseline  Pt. having constipation due to PFM dysfunction    Time  5    Period  Weeks    Status  Achieved    Target Date  04/20/19        PT Long Term Goals - 08/14/19 0001      PT LONG TERM GOAL #1   Title  Pt. will be able to participate in regular ADL's with least restrictive device without pain increasing greater than 2/10    Baseline  Pt. limited in her ability to perform household duties by increased pain and fatigue. As of 4/29: Her legs have more endurance with walking before she gets a tremor. Still needs to sit down the majority of the time she is cooking. Still needs back support for prolonged sitting.    Time  10    Period  Weeks    Status  On-going    Target Date  10/23/19      PT LONG TERM GOAL #2   Title  Patient will score at or below 65/300  on the PFDI and 35% on the Female NIH-CPSI to demonstrate a clinically meaningful decrease in disability and distress due to pelvic floor dysfunction.    Baseline  PFDI: 110/300, Female NIH-CPSI: 29/43 (67%) As of 4/29: 100/300 on PFDI and 30/43 on NIH-CPSI    Time  10    Period  Weeks    Status  Unable to assess    Target Date  10/23/19      PT LONG TERM GOAL #3   Title  Patient will report no pain with intercourse to demonstrate improved functional ability.    Baseline  Pt. Having significant pain with intercourse. As of 4/29: is a little better (20-30%) but is worse on days where overall tension/pain is worse.    Time  10    Period  Weeks    Status  On-going    Target Date  10/23/19  PT LONG TERM GOAL #4   Title  Pt will report ability to work in her yard for greater than 30 minutes without extreme fatigue    Baseline  limited to 10-15 minutes before pt requires ending activity. As of 4/29: ~ 20 min.    Time  10     Period  Weeks    Status  On-going    Target Date  10/23/19      PT LONG TERM GOAL #5   Title  Pt. will be able to go 2-3 hours between emptying her bladder without increased pain and empty her bladder fully whether by urostomy, cath, or voluntary release.    Baseline  Pt. has a urostomy but continues to have to self-catheterize. Has pain with bladder filling and when using catheter. As of 4/29: is doing better since starting to take the pregabalin andd baclofen but occasionally still has pain with bladder filling. It is bad when she has an infection or over-does activity, better on "normal" days which have been few and far between due to frequent UTI's    Time  10    Period  Weeks    Status  On-going    Target Date  10/23/19      PT LONG TERM GOAL #6   Title  Patient will report having BM's at least every-other day with consistency between [facility] stool scale 3-5 over the prior week to demonstrate decreased constipation.    Baseline  Pt. unable to have regula BM's without medication, manometry shows PFM dysfunction. As of 4/29: still having to use and enema or supository occasionally and still needing linzess     Time  10    Period  Weeks    Status  On-going    Target Date  10/23/19            Plan - 09/18/19 1543    Clinical Impression Statement  Pt. Responded well to all interventions today, demonstrating improved relaxation of the posterior PFM though the bladder did not fully relax following the treatment and was irritated at end of session. Pt. demonstrated understanding of all education provided today. They will continue to benefit from skilled physical therapy to work toward remaining goals and maximize function as well as decrease likelihood of symptom increase or recurrence.    PT Next Visit Plan  review HEP and split up into A and B days/priorotize, focus on graded activity, and seated and standing balance/core strength. TPDN with tens at T/L junction review I's Y's and T's in  prone, thoracic mobility? perform further release and PNF for R>L shoulder blades, rotational mobilizations through T-L junction, more scar-release and cupping at suprapubic scar, MFR around B ilium. re-assess ROM/back pain. TPDN to multifidus through T-L  junction with E-stim at 40hz  and at 2 hz. continue to work on thoracolumbar rotation ROM and decreasing spasms, add SLS balance training, begin strengthening for reciprocal inhibition. Internal pelvic exam,    PT Home Exercise Plan  diaphragmatic breathing, seated posture (lumbar support and avoiding adduction, low back stretch, side-stretch, hamstring stretch, butterfly stretch, bow-and-arrow, TENS trial, squatty potty, bowel retraining, toileting tips, colonic massage, thoracic extensions and supine chest stretch with towel roll, hip EXT in prone with knee bent, side-lying hip ABD, child's pose., self internal TP release, I's, Y's, T's, and W's, self TP release to neck/shoulder area, limits of chronic pain/chronic fatigue and moderating activity level, pain/fatigue diary. Posterior pelvic tilts with deep core activation, abdominal support.    Consulted  and Agree with Plan of Care  Patient       Patient will benefit from skilled therapeutic intervention in order to improve the following deficits and impairments:     Visit Diagnosis: Other muscle spasm  Muscle weakness (generalized)  Difficulty in walking, not elsewhere classified     Problem List Patient Active Problem List   Diagnosis Date Noted  . Seizure ([place]) 11/11/2018  . Major depressive disorder, recurrent episode, moderate ([place]) 02/07/2018  . Nephrolithiasis 04/16/2016  . Numbness 07/28/2015  . Bladder retention 06/23/2015  . Abdominal pain 06/04/2015  . Dizziness 05/18/2015  . Neck pain 05/18/2015  . Complicated migraine 21/97/5883  . Other fatigue 04/28/2015  . Abnormal finding on MRI of brain 04/28/2015  . D (diarrhea) 03/29/2015  . H/O disease 03/29/2015  . Abnormal  weight loss 03/29/2015  . Muscle weakness (generalized) 03/14/2015  . Headache, migraine 03/10/2015   Willa Rough DPT, ATC Willa Rough 09/18/2019, 4:24 PM  [place] MAIN [facility] SERVICES [address] [place], [place], [address] Phone: [phone]   Fax:  [phone]  Name: Christine Chavez MRN: [id number] Date of Birth: July 08, 1980

## 2019-09-23 ENCOUNTER — Other Ambulatory Visit: Payer: Self-pay

## 2019-09-23 ENCOUNTER — Ambulatory Visit: Payer: BC Managed Care – PPO

## 2019-09-23 DIAGNOSIS — R262 Difficulty in walking, not elsewhere classified: Secondary | ICD-10-CM

## 2019-09-23 DIAGNOSIS — M62838 Other muscle spasm: Secondary | ICD-10-CM

## 2019-09-23 DIAGNOSIS — M6281 Muscle weakness (generalized): Secondary | ICD-10-CM

## 2019-09-23 NOTE — Therapy (Signed)
[facility] MAIN [facility] SERVICES [address] [place], [place], [address] Phone: [phone]   Fax:  [phone]  Physical Therapy Treatment  The patient has been informed of current processes in place at Outpatient Rehab to protect patients from Covid-19 exposure including social distancing, schedule modifications, and new cleaning procedures. After discussing their particular risk with a therapist based on the patient's personal risk factors, the patient has decided to proceed with in-person therapy.   Patient Details  Name: Christine Chavez MRN: [id number] Date of Birth: 1980/11/25 Referring Provider (PT): [name]   Encounter Date: 09/23/2019  PT End of Session - 09/23/19 1513    Visit Number  42    Number of Visits  53    Date for PT Re-Evaluation  10/23/19    Authorization Type  [employer]    Authorization Time Period  from 08/14/2019 through 10/23/2019    Authorization - Visit Number  9    Authorization - Number of Visits  20    Progress Note Due on Visit  43    PT Start Time  1334    PT Stop Time  1440    PT Time Calculation (min)  66 min    Activity Tolerance  Patient tolerated treatment well;No increased pain    Behavior During Therapy  WFL for tasks assessed/performed       Past Medical History:  Diagnosis Date  . Complication of anesthesia    ? seizures after anesthesia   . Headache   . Migraines   . Neurogenic bladder   . Renal disorder   . Vision abnormalities     Past Surgical History:  Procedure Laterality Date  . ANTERIOR CRUCIATE LIGAMENT REPAIR  1997  . APPENDECTOMY    . COLONOSCOPY WITH PROPOFOL N/A 11/11/2018   Procedure: COLONOSCOPY WITH PROPOFOL;  Surgeon: Christena Deem, MD;  Location: [facility] ENDOSCOPY;  Service: Endoscopy;  Laterality: N/A;  . CYSTOSCOPY WITH STENT PLACEMENT Right 04/17/2016   Procedure: CYSTOSCOPY WITH STENT PLACEMENT;  Surgeon: Malen Gauze, MD;  Location: [facility] ORS;   Service: Urology;  Laterality: Right;  . ESOPHAGOGASTRODUODENOSCOPY (EGD) WITH PROPOFOL N/A 11/11/2018   Procedure: ESOPHAGOGASTRODUODENOSCOPY (EGD) WITH PROPOFOL;  Surgeon: Christena Deem, MD;  Location: [facility] ENDOSCOPY;  Service: Endoscopy;  Laterality: N/A;  . EXPLORATORY LAPAROTOMY  1999  . KIDNEY STONE SURGERY  04/2016  . REVISION UROSTOMY CUTANEOUS    . REVISION UROSTOMY CUTANEOUS  01/10/2018  . SUPRAPUBIC CATHETER PLACEMENT  08/2017  . TONSILLECTOMY      There were no vitals filed for this visit.  Pelvic Floor Physical Therapy Treatment Note  SCREENING  Changes in medications, allergies, or medical history?: UTI seems to be responding to meds.  SUBJECTIVE  Patient reports: Had increased pain and had to lay with a heating pad after last visit. Did better on Friday but then had spasms again on Saturday that made it harder to get the catheter in. Thinks she has a yeast infection from the antibiotics and it sometimes makes her have more spasms. She is still having a weird sharp shooting pelvic pain on the left>Right. Feels like it is where her ovaries are rather than bladder area. Had no trouble going on Thursday when her stomach was upset. Had the same upset stomach feeling but was not having a BM until she used an enema on Sunday. Monday she went without trouble.    Had to get up 4 times last night to empty  her bladder.   Precautions:  Neurological diagnosis pending, has "non-specific" brain lesions   Pain update:  Location of pain: L Flank pain (pelvic area) Current pain:  4/10 (4/10, increases with motion) Max pain:  6/10 (8/10 when it spasms) Least pain:  2/10 (2/10) Nature of pain: sharp to dull ache  **decreased pain and tightness through the back following treatment.  Patient Goals: Get rid of the discomfort with catheterizing and ache/bladder pain. Be able to take fewer medications and be able to enjoy spending time with her family again, doing "all the mom  things"    OBJECTIVE  Changes in:  Observations/Posture:  L lumbar curve prominent with spinous processes more prominent on the L. (from previous note)  Range of Motion/Flexibilty:  (from 12/10) Spine: ~ 2-3 fingers from knee with SB B. ~ 50% reduced R rotation with stretch in L, ~ 75% reduced L rotation with increased tightness. Forward bend: 19 in.from floor. Hips: Unable to achieve neutral pelvis in long-sitting due to hamstring tightness.   Strength/MMT:  LE MMT (from 12-10) LE MMT Left Right  Hip flex:  (L2) /5 /5  Hip ext: 3+/5 3+/5  Hip abd: 4/5 4/5  Hip add: 4/5 3+/5  Hip IR 4/5 3+/5  Hip ER 4/5 4/5   (From 3/24): Pt. Demonstrates 3/5 strength with "I's, Y's, and T's" but 4/5 with W's.  Balance/stability: Pt. Able to maintain upright posture with no UE support and slow deliberate perturbations for ~ 2 min with increasing stability and 1 min. With increased speed of direction change though greater variation from the center occurred and it took longer for correction and improved by ~ 50% over the time performed. (from prior visit)  Pelvic Floor External Exam: (From prior session)- [Introitus Appears: elevated Skin integrity: WNL Palpation: TTP to B STP Cough: limited motion Prolapse visible?: no Scar mobility: not assessed  Internal Vaginal Exam: Strength (PERF): Pt. Has difficulty sensing the PFM to generate contraction or relaxation due to high tone/neuro involvement Symmetry: L>R for spasm/TTP Palpation: TTP to all muscles throughout B Prolapse: none]  Today: TTP at perineum and Pt. Had response throughout PFM with treatment.  Abdominal:  Pt. Able to recruit TA and glutes appropriately, reaches fatigue with posterior pelvic tilt in hook-lying at around 10 reps. (from prior note)  Palpation:  Gait Analysis:  INTERVENTIONS THIS SESSION:  Manual: Performed TP release and STM to B paraspinals at the T/L junction to decrease spasm and pain and allow for  improved balance of musculature for improved function and decreased symptoms.  Dry-needle: Performed TPDN with 4 .30x2760mm needles and e-stim and standard approach as described below to decrease spasm and pain and allow for improved balance of musculature for improved function and decreased symptoms.  E-stim: Performed E-stim with dry-needling at a frequency of 2 and muscle contraction intensity for 15 min. Across the T-L junction in an ipsilateral setup to help decrease spasm and calm down the L1-2 nerve roots to help decrease tension in the bladder and PFM to allow for decreased pain and spasm. Pt. Educated on using TENS at home to more-frequently stimulate the same area in attempt to down-regulate the nerves at this level to help prevent bladder and PFM spasms longer term.   Total time: 60 min.                              PT Short Term Goals - 08/14/19 1031  PT SHORT TERM GOAL #1   Title  Patient will demonstrate coordinated diaphragmatic breathing with pelvic tilts to demonstrate improved control of diaphragm and TA, to allow for further strengthening of core musculature and decreased pelvic floor spasm.    Baseline  Pt. demonstrates breathing dysfunction and poor PFM coordination evidenced by anal manometry As of 2/22: Pt. continues to have restriction in her diaphragm and near T/L junction but is able to intentionally use diaphragmatic breathing through the available ROM.    Time  5    Period  Weeks    Status  Achieved    Target Date  04/20/19      PT SHORT TERM GOAL #2   Title  Patient will report a reduction in pain to no greater than 6/10 over the prior week to demonstrate symptom improvement.    Baseline  Pain is 10/10 at worst, 2/10 at best As of 2/22: Pt. had achieved this goal for 1 week as of 1/21 but may have infection or other insult that caused pain to increase again. As of 4/29: Pain high of 8/10 but has been over-doing her activity over the past  week.    Time  5    Period  Weeks    Status  On-going    Target Date  10/23/19      PT SHORT TERM GOAL #3   Title  Patient will demonstrate HEP x1 in the clinic to demonstrate understanding and proper form to allow for further improvement.    Baseline  Pt. lacks knowledge of therepeutic exercises that can decrease her pain/Sx.    Time  5    Period  Weeks    Status  Achieved    Target Date  04/20/19      PT SHORT TERM GOAL #4   Title  Patient will report consistent use of foot-stool (squatty-potty) for positioning with BM to decrease pain with BM and intra-abdominal pressure.    Baseline  Pt. having constipation due to PFM dysfunction    Time  5    Period  Weeks    Status  Achieved    Target Date  04/20/19        PT Long Term Goals - 08/14/19 0001      PT LONG TERM GOAL #1   Title  Pt. will be able to participate in regular ADL's with least restrictive device without pain increasing greater than 2/10    Baseline  Pt. limited in her ability to perform household duties by increased pain and fatigue. As of 4/29: Her legs have more endurance with walking before she gets a tremor. Still needs to sit down the majority of the time she is cooking. Still needs back support for prolonged sitting.    Time  10    Period  Weeks    Status  On-going    Target Date  10/23/19      PT LONG TERM GOAL #2   Title  Patient will score at or below 65/300  on the PFDI and 35% on the Female NIH-CPSI to demonstrate a clinically meaningful decrease in disability and distress due to pelvic floor dysfunction.    Baseline  PFDI: 110/300, Female NIH-CPSI: 29/43 (67%) As of 4/29: 100/300 on PFDI and 30/43 on NIH-CPSI    Time  10    Period  Weeks    Status  Unable to assess    Target Date  10/23/19      PT LONG TERM GOAL #3  Title  Patient will report no pain with intercourse to demonstrate improved functional ability.    Baseline  Pt. Having significant pain with intercourse. As of 4/29: is a little  better (20-30%) but is worse on days where overall tension/pain is worse.    Time  10    Period  Weeks    Status  On-going    Target Date  10/23/19      PT LONG TERM GOAL #4   Title  Pt will report ability to work in her yard for greater than 30 minutes without extreme fatigue    Baseline  limited to 10-15 minutes before pt requires ending activity. As of 4/29: ~ 20 min.    Time  10    Period  Weeks    Status  On-going    Target Date  10/23/19      PT LONG TERM GOAL #5   Title  Pt. will be able to go 2-3 hours between emptying her bladder without increased pain and empty her bladder fully whether by urostomy, cath, or voluntary release.    Baseline  Pt. has a urostomy but continues to have to self-catheterize. Has pain with bladder filling and when using catheter. As of 4/29: is doing better since starting to take the pregabalin andd baclofen but occasionally still has pain with bladder filling. It is bad when she has an infection or over-does activity, better on "normal" days which have been few and far between due to frequent UTI's    Time  10    Period  Weeks    Status  On-going    Target Date  10/23/19      PT LONG TERM GOAL #6   Title  Patient will report having BM's at least every-other day with consistency between [facility] stool scale 3-5 over the prior week to demonstrate decreased constipation.    Baseline  Pt. unable to have regula BM's without medication, manometry shows PFM dysfunction. As of 4/29: still having to use and enema or supository occasionally and still needing linzess     Time  10    Period  Weeks    Status  On-going    Target Date  10/23/19            Plan - 09/23/19 1514    Clinical Impression Statement  Pt. Responded well to all interventions today, demonstrating decreased spasm and pain as well as understanding and correct performance of all education and exercises provided today. They will continue to benefit from skilled physical therapy to work  toward remaining goals and maximize function as well as decrease likelihood of symptom increase or recurrence.     PT Next Visit Plan  review HEP and split up into A and B days/priorotize, focus on graded activity, and seated and standing balance/core strength. TPDN with tens at T/L junction review I's Y's and T's in prone, thoracic mobility? perform further release and PNF for R>L shoulder blades, rotational mobilizations through T-L junction, more scar-release and cupping at suprapubic scar, MFR around B ilium. re-assess ROM/back pain. TPDN to multifidus through T-L  junction with E-stim at 40hz  and at 2 hz. continue to work on thoracolumbar rotation ROM and decreasing spasms, add SLS balance training, begin strengthening for reciprocal inhibition. Internal pelvic exam,    PT Home Exercise Plan  diaphragmatic breathing, seated posture (lumbar support and avoiding adduction, low back stretch, side-stretch, hamstring stretch, butterfly stretch, bow-and-arrow, TENS trial, squatty potty, bowel retraining, toileting tips, colonic  massage, thoracic extensions and supine chest stretch with towel roll, hip EXT in prone with knee bent, side-lying hip ABD, child's pose., self internal TP release, I's, Y's, T's, and W's, self TP release to neck/shoulder area, limits of chronic pain/chronic fatigue and moderating activity level, pain/fatigue diary. Posterior pelvic tilts with deep core activation, abdominal support.    Consulted and Agree with Plan of Care  Patient       Patient will benefit from skilled therapeutic intervention in order to improve the following deficits and impairments:     Visit Diagnosis: Other muscle spasm  Muscle weakness (generalized)  Difficulty in walking, not elsewhere classified     Problem List Patient Active Problem List   Diagnosis Date Noted  . Seizure ([place]) 11/11/2018  . Major depressive disorder, recurrent episode, moderate ([place]) 02/07/2018  . Nephrolithiasis  04/16/2016  . Numbness 07/28/2015  . Bladder retention 06/23/2015  . Abdominal pain 06/04/2015  . Dizziness 05/18/2015  . Neck pain 05/18/2015  . Complicated migraine 45/[id number]/[id number]  . Other fatigue 04/28/2015  . Abnormal finding on MRI of brain 04/28/2015  . D (diarrhea) 03/29/2015  . H/O disease 03/29/2015  . Abnormal weight loss 03/29/2015  . Muscle weakness (generalized) 03/14/2015  . Headache, migraine 03/10/2015   Willa Rough DPT, ATC Willa Rough 09/23/2019, 3:16 PM  [place] MAIN [facility] SERVICES [name] [place], [place], [address] Phone: [phone]   Fax:  [phone]  Name: Elaya Droege MRN: [id number] Date of Birth: 1980-12-17

## 2019-09-23 NOTE — Patient Instructions (Signed)
  Place pads as seen above. Turn the intensity up until it is a strong tingle. Run for 30 min, ~ 3 times per day or more if you feel increased tension/pain.

## 2019-09-25 ENCOUNTER — Other Ambulatory Visit: Payer: Self-pay

## 2019-09-25 ENCOUNTER — Ambulatory Visit: Payer: BC Managed Care – PPO

## 2019-09-25 DIAGNOSIS — R262 Difficulty in walking, not elsewhere classified: Secondary | ICD-10-CM

## 2019-09-25 DIAGNOSIS — M62838 Other muscle spasm: Secondary | ICD-10-CM | POA: Diagnosis not present

## 2019-09-25 DIAGNOSIS — M6281 Muscle weakness (generalized): Secondary | ICD-10-CM

## 2019-09-25 NOTE — Patient Instructions (Addendum)
As needed/things to remember: diaphragmatic breathing, seated posture lumbar support and avoiding adduction (knees coming in), squatty potty, bowel retraining, toileting tips, colonic (I love you) massage, self TP release to neck/shoulder area (theracane), pain/fatigue diary, abdominal support band.  A day: low back stretch, side-stretch,  hamstring stretch, butterfly stretch, hip EXT in prone with knee bent, side-lying hip ABD  B day: Thoracic extensions,  I's, Y's, T's, and W's, self internal TP release, child's pose stretch.  Every day: Pain diary, Posterior pelvic tilts with deep core activation, bow-and-arrow, TENS unit 3+ times per day for 30 min and as needed if pain starts to increase.   Home button first, then:  "twitch" setting: NMES> large muscle> spasm> turn up until you feel strong twitch.  "tingle" setting: TENS> back> turn up until strong tingle felt.

## 2019-09-25 NOTE — Therapy (Signed)
[facility] MAIN [facility] SERVICES [address] [place], [place], [address] Phone: [phone]   Fax:  [phone]  Physical Therapy Progress Note   Dates of reporting period  08/20/19   to   09/25/19   The patient has been informed of current processes in place at Outpatient Rehab to protect patients from Covid-19 exposure including social distancing, schedule modifications, and new cleaning procedures. After discussing their particular risk with a therapist based on the patient's personal risk factors, the patient has decided to proceed with in-person therapy.   Patient Details  Name: Kambry Takacs MRN: [id number] Date of Birth: [birth year] Referring Provider (PT): [name]   Encounter Date: 09/25/2019   PT End of Session - 09/25/19 1756    Visit Number 43    Number of Visits 53    Date for PT Re-Evaluation 10/23/19    Authorization Type [employer]    Authorization Time Period from 08/14/2019 through 10/23/2019    Authorization - Visit Number 10    Authorization - Number of Visits 20    Progress Note Due on Visit 43    PT Start Time 1100    PT Stop Time 1200    PT Time Calculation (min) 60 min    Activity Tolerance Patient tolerated treatment well;No increased pain    Behavior During Therapy WFL for tasks assessed/performed           Past Medical History:  Diagnosis Date  . Complication of anesthesia    ? seizures after anesthesia   . Headache   . Migraines   . Neurogenic bladder   . Renal disorder   . Vision abnormalities     Past Surgical History:  Procedure Laterality Date  . ANTERIOR CRUCIATE LIGAMENT REPAIR  1997  . APPENDECTOMY    . COLONOSCOPY WITH PROPOFOL N/A 11/11/2018   Procedure: COLONOSCOPY WITH PROPOFOL;  Surgeon: Christena Deem, MD;  Location: [facility] ENDOSCOPY;  Service: Endoscopy;  Laterality: N/A;  . CYSTOSCOPY WITH STENT PLACEMENT Right 04/17/2016   Procedure: CYSTOSCOPY WITH STENT PLACEMENT;   Surgeon: Malen Gauze, MD;  Location: [facility] ORS;  Service: Urology;  Laterality: Right;  . ESOPHAGOGASTRODUODENOSCOPY (EGD) WITH PROPOFOL N/A 11/11/2018   Procedure: ESOPHAGOGASTRODUODENOSCOPY (EGD) WITH PROPOFOL;  Surgeon: Christena Deem, MD;  Location: [facility] ENDOSCOPY;  Service: Endoscopy;  Laterality: N/A;  . EXPLORATORY LAPAROTOMY  1999  . KIDNEY STONE SURGERY  04/2016  . REVISION UROSTOMY CUTANEOUS    . REVISION UROSTOMY CUTANEOUS  01/10/2018  . SUPRAPUBIC CATHETER PLACEMENT  08/2017  . TONSILLECTOMY      There were no vitals filed for this visit.   Pelvic Floor Physical Therapy Treatment Note  SCREENING  Changes in medications, allergies, or medical history?: UTI seems to be responding to meds.  SUBJECTIVE  Patient reports: Used TENS several times yesterday afternoon so it may have helped some since she does not feel like she went back to being super tight. Also had to know her limits, stopped herself when she started to over-do it and had her husband rub her back some. Noticed that she felt like she had to pee a lot more frequently while it was running.  turned it off when she was trying to empty her bladder.   Precautions:  Neurological diagnosis pending, has "non-specific" brain lesions   Pain update:  Location of pain: L Flank pain (pelvic area) Current pain:  3/10 (3/10, increases with motion) Max pain:  5/10 (4/10 when it  spasms) Least pain:  3/10 (3/10) Nature of pain: sharp to dull ache  **decreased pain and tightness through the back following treatment.  Patient Goals: Get rid of the discomfort with catheterizing and ache/bladder pain. Be able to take fewer medications and be able to enjoy spending time with her family again, doing "all the mom things"    OBJECTIVE  Changes in:  Observations/Posture:  L lumbar curve prominent with transverse processes more prominent on the L. (from previous note)  Range of Motion/Flexibilty:  (from  12/10) Spine: ~ 2-3 fingers from knee with SB B. ~ 50% reduced R rotation with stretch in L, ~ 75% reduced L rotation with increased tightness. Forward bend: 19 in.from floor. Hips: Unable to achieve neutral pelvis in long-sitting due to hamstring tightness.  Decreased mobility through ~ T9-L3   Strength/MMT:  LE MMT (from 12-10) LE MMT Left Right  Hip flex:  (L2) /5 /5  Hip ext: 3+/5 3+/5  Hip abd: 4/5 4/5  Hip add: 4/5 3+/5  Hip IR 4/5 3+/5  Hip ER 4/5 4/5   (From 3/24): Pt. Demonstrates 3/5 strength with "I's, Y's, and T's" but 4/5 with W's.  Balance/stability: Pt. Able to maintain upright posture with no UE support and slow deliberate perturbations for ~ 2 min with increasing stability and 1 min. With increased speed of direction change though greater variation from the center occurred and it took longer for correction and improved by ~ 50% over the time performed. (from prior visit)  Pelvic Floor External Exam: (From prior session)- [Introitus Appears: elevated Skin integrity: WNL Palpation: TTP to B STP Cough: limited motion Prolapse visible?: no Scar mobility: not assessed  Internal Vaginal Exam: Strength (PERF): Pt. Has difficulty sensing the PFM to generate contraction or relaxation due to high tone/neuro involvement Symmetry: L>R for spasm/TTP Palpation: TTP to all muscles throughout B Prolapse: none]   Abdominal:  Pt. Able to recruit TA and glutes appropriately, reaches fatigue with posterior pelvic tilt in hook-lying at around 10 reps. (from prior note)  Palpation:  Gait Analysis:  INTERVENTIONS THIS SESSION:  Therex: reviewed HEP, prioritized into A and B day plan to make HEP more manageable for Pt.   Manual: PA mobs through ~ T9-L3 to improve mobility of joint and surrounding connective tissue and decrease pressure on nerve roots for improved conductivity and function of down-stream tissues.   E-stim: Performed E-stim with TENS unit set to NMES for  large muscle group and spasm at twitch intensity across T/L junction and L/S junction to determine if it would be more effective for  down-regulating the hypogastric nerve to decrease pain and spasm. Educated on using TENS and NMES settings at home to more-frequently stimulate the same area in attempt to down-regulate the nerves at this level to help prevent bladder and PFM spasms longer term.   Total time: 60 min.                               PT Short Term Goals - 09/25/19 1757      PT SHORT TERM GOAL #1   Title Patient will demonstrate coordinated diaphragmatic breathing with pelvic tilts to demonstrate improved control of diaphragm and TA, to allow for further strengthening of core musculature and decreased pelvic floor spasm.    Baseline Pt. demonstrates breathing dysfunction and poor PFM coordination evidenced by anal manometry As of 2/22: Pt. continues to have restriction in her diaphragm and  near T/L junction but is able to intentionally use diaphragmatic breathing through the available ROM.    Time 5    Period Weeks    Status Achieved    Target Date 04/20/19      PT SHORT TERM GOAL #2   Title Patient will report a reduction in pain to no greater than 6/10 over the prior week to demonstrate symptom improvement.    Baseline Pain is 10/10 at worst, 2/10 at best As of 2/22: Pt. had achieved this goal for 1 week as of 1/21 but may have infection or other insult that caused pain to increase again. As of 4/29: Pain high of 8/10 but has been over-doing her activity over the past week. As of 6/10: Pt. was able to keep pain at 6 or below since prior visit by using TENS to help keep tension lower but has not had a full week to test if it will keep working.    Time 5    Period Weeks    Status On-going    Target Date 10/23/19      PT SHORT TERM GOAL #3   Title Patient will demonstrate HEP x1 in the clinic to demonstrate understanding and proper form to allow for  further improvement.    Baseline Pt. lacks knowledge of therepeutic exercises that can decrease her pain/Sx.    Time 5    Period Weeks    Status Achieved    Target Date 04/20/19      PT SHORT TERM GOAL #4   Title Patient will report consistent use of foot-stool (squatty-potty) for positioning with BM to decrease pain with BM and intra-abdominal pressure.    Baseline Pt. having constipation due to PFM dysfunction    Time 5    Period Weeks    Status Achieved    Target Date 04/20/19             PT Long Term Goals - 09/25/19 0001      PT LONG TERM GOAL #1   Title Pt. will be able to participate in regular ADL's with least restrictive device without pain increasing greater than 2/10    Baseline Pt. limited in her ability to perform household duties by increased pain and fatigue. As of 4/29: Her legs have more endurance with walking before she gets a tremor. Still needs to sit down the majority of the time she is cooking. Still needs back support for prolonged sitting. As of 6/10: Pt. able to do for  ~1 hour before she needs to rest to prevent increased pain, pain is averaging ~ 4-5/10    Time 10    Period Weeks    Status On-going    Target Date 10/23/19      PT LONG TERM GOAL #2   Title Patient will score at or below 65/300  on the PFDI and 35% on the Female NIH-CPSI to demonstrate a clinically meaningful decrease in disability and distress due to pelvic floor dysfunction.    Baseline PFDI: 110/300, Female NIH-CPSI: 29/43 (67%) As of 4/29: 100/300 on PFDI and 30/43 on NIH-CPSI    Time 10    Period Weeks    Status Unable to assess    Target Date 10/23/19      PT LONG TERM GOAL #3   Title Patient will report no pain with intercourse to demonstrate improved functional ability.    Baseline Pt. Having significant pain with intercourse. As of 4/29: is a little better (20-30%) but  is worse on days where overall tension/pain is worse. As of 6/10: can have days where there is not much pain  but other days where there is significant pain.    Time 10    Period Weeks    Status On-going    Target Date 10/23/19      PT LONG TERM GOAL #4   Title Pt will report ability to work in her yard for greater than 30 minutes without extreme fatigue    Baseline limited to 10-15 minutes before pt requires ending activity. As of 4/29: ~ 20 min. As of 6/10: has not been gardening much but can do ~ 30 min of similar indoor activity level before increased fatigue.    Time 10    Period Weeks    Status Achieved    Target Date 10/23/19      PT LONG TERM GOAL #5   Title Pt. will be able to go 2-3 hours between emptying her bladder without increased pain and empty her bladder fully whether by urostomy, cath, or voluntary release.    Baseline Pt. has a urostomy but continues to have to self-catheterize. Has pain with bladder filling and when using catheter. As of 4/29: is doing better since starting to take the pregabalin andd baclofen but occasionally still has pain with bladder filling. It is bad when she has an infection or over-does activity, better on "normal" days which have been few and far between due to frequent UTI's. As of 6/10: Pt. has "good" and "bad" days but has more days where she can go 2-3 hours without increased pain    Time 10    Period Weeks    Status On-going    Target Date 10/23/19      PT LONG TERM GOAL #6   Title Patient will report having BM's at least every-other day with consistency between [facility] stool scale 3-5 over the prior week to demonstrate decreased constipation.    Baseline Pt. unable to have regula BM's without medication, manometry shows PFM dysfunction. As of 4/29: still having to use and enema or supository occasionally and still needing linzess. As of 6/10: Pt. has days where she can empty ok and days where she needs to use an Enema to stimulate a BM, feels that it is just the insertion of the enema, not the solution that does the job.    Time 10    Period Weeks     Status On-going    Target Date 10/23/19                 Plan - 09/25/19 1757    Clinical Impression Statement Pt. is still making slow progress, in large part due to fighting frequent UTI's over the last few months, but she does demonstrate decreased pain and spasm with PT interventions and is making small gains. We have made the most significant impact on symptoms when we focus on the T/L junction through dry-needling, stim, and mobility. It seems that up-regulation of the hypogastric nerve may be the most implicated culprit of recurrent Sx more than the pudendal nerve as suspected before the nerve block was performed. Pt. would benefit from an interdisciplinary "meeting of the minds" to determine what may be the most helpful solution for maintaining spasm reduction and increasing function long-term. I wonder if something like interstim or a different nerve block are options for her or if there are any other options. I believe that the recurrent spasms are part of why  the Pt. continues to contract UTI's and that the UTI's are in tern increasing tension and spasm so something needs to break the cycle for long-term improvement. The Pt. will continue to benefit from deep-core strengthening and hip/LE strengthening to increase her function and allow enough stability to give the PFM a chance to relax more. We are trialing using TENS at the T/L junction to see if we are able to sustain decreased pain/spasm for a longer period through affecting the hypogastric nerve, hopefully this may help shed a little more light on next steps.    Personal Factors and Comorbidities Comorbidity 3+    Comorbidities neurologic insult with pending diagnosis, multiple recurrent UTI's, BLE weakness, depression    Examination-Activity Limitations Caring for Others;Carry;Continence;Squat;Stairs;Toileting;Locomotion Level;Lift;Stand    Examination-Participation Restrictions Church;Cleaning;Community  Activity;Driving;Interpersonal Relationship;Laundry;Yard Work;Meal Prep    Stability/Clinical Decision Making Unstable/Unpredictable    Clinical Decision Making High    Rehab Potential Fair    PT Frequency 2x / week    PT Duration Other (comment)   10 weeks   PT Treatment/Interventions ADLs/Self Care Home Management;Aquatic Therapy;Biofeedback;Moist Heat;Electrical Stimulation;Cryotherapy;Traction;Ultrasound;Therapeutic activities;Functional mobility training;Stair training;Gait training;Therapeutic exercise;Balance training;Neuromuscular re-education;Patient/family education;Manual techniques;Dry needling;Scar mobilization;Energy conservation;Taping;Joint Manipulations;Spinal Manipulations;Visual/perceptual remediation/compensation;Passive range of motion    PT Next Visit Plan ask about success with TENS/NMES, focus on graded activity, and seated and standing balance/core strength. review I's Y's and T's in prone, thoracic mobility? perform further release and PNF for R>L shoulder blades, more scar-release and cupping at suprapubic scar, MFR around B ilium. TPDN to multifidus through T-L  junction with E-stim at 40hz  and at 2 hz. continue to work on thoracolumbar rotation ROM and decreasing spasms, add SLS balance training, begin strengthening for reciprocal inhibition.    PT Home Exercise Plan diaphragmatic breathing, seated posture (lumbar support and avoiding adduction, low back stretch, side-stretch, hamstring stretch, butterfly stretch, bow-and-arrow, TENS trial, squatty potty, bowel retraining, toileting tips, colonic massage, thoracic extensions and supine chest stretch with towel roll, hip EXT in prone with knee bent, side-lying hip ABD, child's pose., self internal TP release, I's, Y's, T's, and W's, self TP release to neck/shoulder area, limits of chronic pain/chronic fatigue and moderating activity level, pain/fatigue diary. Posterior pelvic tilts with deep core activation, abdominal support.     Consulted and Agree with Plan of Care Patient           Patient will benefit from skilled therapeutic intervention in order to improve the following deficits and impairments:  Abnormal gait, Decreased balance, Decreased endurance, Decreased mobility, Difficulty walking, Increased muscle spasms, Impaired sensation, Improper body mechanics, Impaired tone, Decreased activity tolerance, Decreased coordination, Decreased strength, Postural dysfunction, Pain  Visit Diagnosis: Other muscle spasm  Muscle weakness (generalized)  Difficulty in walking, not elsewhere classified     Problem List Patient Active Problem List   Diagnosis Date Noted  . Seizure (HCC) 11/11/2018  . Major depressive disorder, recurrent episode, moderate (HCC) 02/07/2018  . Nephrolithiasis 04/16/2016  . Numbness 07/28/2015  . Bladder retention 06/23/2015  . Abdominal pain 06/04/2015  . Dizziness 05/18/2015  . Neck pain 05/18/2015  . Complicated migraine 04/28/2015  . Other fatigue 04/28/2015  . Abnormal finding on MRI of brain 04/28/2015  . D (diarrhea) 03/29/2015  . H/O disease 03/29/2015  . Abnormal weight loss 03/29/2015  . Muscle weakness (generalized) 03/14/2015  . Headache, migraine 03/10/2015   Cleophus MoltKeeli T. Ketzia Guzek DPT, ATC Cleophus MoltKeeli T Remmi Armenteros 09/25/2019, 6:24 PM  [facility] MAIN REHAB SERVICES [address]  [place], [place], [address] Phone: [phone]   Fax:  [phone]  Name: Jayne Peckenpaugh MRN: [id number] Date of Birth: 08/18/1980

## 2019-10-07 ENCOUNTER — Ambulatory Visit: Payer: BC Managed Care – PPO

## 2019-10-07 ENCOUNTER — Other Ambulatory Visit: Payer: Self-pay

## 2019-10-07 DIAGNOSIS — R262 Difficulty in walking, not elsewhere classified: Secondary | ICD-10-CM

## 2019-10-07 DIAGNOSIS — M62838 Other muscle spasm: Secondary | ICD-10-CM | POA: Diagnosis not present

## 2019-10-07 DIAGNOSIS — M6281 Muscle weakness (generalized): Secondary | ICD-10-CM

## 2019-10-07 NOTE — Patient Instructions (Addendum)
  Do 3x8 calf raises, going up as high as you can and controlling on the way down   Do 3x8, controlling on the way back down.   Do 3x8, controlling on the way back in.

## 2019-10-07 NOTE — Therapy (Signed)
[place] [facility] MAIN [facility] SERVICES [address] [place], [place], [address] Phone: [phone]   Fax:  [phone]  Physical Therapy Treatment  The patient has been informed of current processes in place at Outpatient Rehab to protect patients from Covid-19 exposure including social distancing, schedule modifications, and new cleaning procedures. After discussing their particular risk with a therapist based on the patient's personal risk factors, the patient has decided to proceed with in-person therapy.   Patient Details  Name: Christine Chavez MRN: [id number] Date of Birth: Aug 26, 1980 Referring Provider (PT): Lexine Baton Christine Chavez   Encounter Date: 10/07/2019   PT End of Session - 10/08/19 0741    Visit Number 44    Number of Visits 53    Date for PT Re-Evaluation 10/23/19    Authorization Type [employer]    Authorization Time Period from 08/14/2019 through 10/23/2019    Authorization - Visit Number 11    Authorization - Number of Visits 20    Progress Note Due on Visit 53    PT Start Time 1400    PT Stop Time 1500    PT Time Calculation (min) 60 min    Activity Tolerance Patient tolerated treatment well;No increased pain    Behavior During Therapy WFL for tasks assessed/performed           Past Medical History:  Diagnosis Date  . Complication of anesthesia    ? seizures after anesthesia   . Headache   . Migraines   . Neurogenic bladder   . Renal disorder   . Vision abnormalities     Past Surgical History:  Procedure Laterality Date  . ANTERIOR CRUCIATE LIGAMENT REPAIR  1997  . APPENDECTOMY    . COLONOSCOPY WITH PROPOFOL N/A 11/11/2018   Procedure: COLONOSCOPY WITH PROPOFOL;  Surgeon: Christena Deem, MD;  Location: [facility] ENDOSCOPY;  Service: Endoscopy;  Laterality: N/A;  . CYSTOSCOPY WITH STENT PLACEMENT Right 04/17/2016   Procedure: CYSTOSCOPY WITH STENT PLACEMENT;  Surgeon: Malen Gauze, MD;  Location: [facility] ORS;  Service:  Urology;  Laterality: Right;  . ESOPHAGOGASTRODUODENOSCOPY (EGD) WITH PROPOFOL N/A 11/11/2018   Procedure: ESOPHAGOGASTRODUODENOSCOPY (EGD) WITH PROPOFOL;  Surgeon: Christena Deem, MD;  Location: [facility] ENDOSCOPY;  Service: Endoscopy;  Laterality: N/A;  . EXPLORATORY LAPAROTOMY  1999  . KIDNEY STONE SURGERY  04/2016  . REVISION UROSTOMY CUTANEOUS    . REVISION UROSTOMY CUTANEOUS  01/10/2018  . SUPRAPUBIC CATHETER PLACEMENT  08/2017  . TONSILLECTOMY      There were no vitals filed for this visit.  Pelvic Floor Physical Therapy Treatment Note  SCREENING  Changes in medications, allergies, or medical history?: UTI seems to be responding to meds.  SUBJECTIVE  Patient reports: Was having more back pain on Sunday but then she got her period and has had a heavy period. Did "pretty well" on her trip, did still have to take oxybutynin twice. The biggest struggle was her bowels on the trip Went 3-4 days without a BM, went a little with the suppository and ended up needing an enema by Thursday. Last night she was nauseated, had a migraine and was  Up and down to the bathroom multiple times. Did feel like the TENS helped especially when she was on her trip and getting packed/unpacked. Did get some discomfort on the boat but it was very therapeutic for her mentally. Overall was able to rest her body a lot.  Precautions:  Neurological diagnosis pending, has "non-specific" brain lesions  Pain update:  Location of pain: L Flank pain (pelvic area) Current pain:  2/10 (2/10, increases with motion) Max pain:  4/10 (4/10 when it spasms) Least pain:  2/10 (2/10) Nature of pain: sharp to dull ache  **no increased pain following session  Patient Goals: Get rid of the discomfort with catheterizing and ache/bladder pain. Be able to take fewer medications and be able to enjoy spending time with her family again, doing "all the mom things"    OBJECTIVE  Changes in:  Observations/Posture:  L  lumbar curve prominent with transverse processes more prominent on the L. (from previous note)  Range of Motion/Flexibilty:  (from 12/10) Spine: ~ 2-3 fingers from knee with SB B. ~ 50% reduced R rotation with stretch in L, ~ 75% reduced L rotation with increased tightness. Forward bend: 19 in.from floor. Hips: Unable to achieve neutral pelvis in long-sitting due to hamstring tightness.   Strength/MMT:  LE MMT (from 12-10) LE MMT Left Right  Hip flex:  (L2) /5 /5  Hip ext: 3+/5 3+/5  Hip abd: 4/5 4/5  Hip add: 4/5 3+/5  Hip IR 4/5 3+/5  Hip ER 4/5 4/5   (From 3/24): Pt. Demonstrates 3/5 strength with "I's, Y's, and T's" but 4/5 with W's. TODAY: DF: 9 reps with B. Eversion/Inversion: 4/5 on R, 4+/5 on L. DF: L 4+/5, R 3+/5  Seated IR/ER on L: 4/5, R 3+/5  Balance/stability: Pt. Able to maintain upright posture with no UE support and slow deliberate perturbations for ~ 2 min with increasing stability and 1 min. With increased speed of direction change though greater variation from the center occurred and it took longer for correction and improved by ~ 50% over the time performed. (from prior visit)  Pelvic Floor External Exam: (From prior session)- [Introitus Appears: elevated Skin integrity: WNL Palpation: TTP to B STP Cough: limited motion Prolapse visible?: no Scar mobility: not assessed  Internal Vaginal Exam: Strength (PERF): Pt. Has difficulty sensing the PFM to generate contraction or relaxation due to high tone/neuro involvement Symmetry: L>R for spasm/TTP Palpation: TTP to all muscles throughout B Prolapse: none]   Abdominal:  Pt. Able to recruit TA and glutes appropriately, reaches fatigue with posterior pelvic tilt in hook-lying at around 10 reps. (from prior note)  Palpation:  Gait Analysis:  INTERVENTIONS THIS SESSION:  Therex: assessed ankle and hip IR/ER MMT and educated on calf-raises, Eversion and dorsiflexion strengthening exercises with yellow band  to improve strength/stability and to give input into the L5-S2 nerve roots to help increase overall hip stability too and allow for decreased PFM tension.  Self-care: discussed success/limitation of e-stim and current HEP and attempted to tease out whether the TENS settings are most appropriate/if they may be affecting the bowel changes. Discussed the potential that the hypogastric or pelvic nerves may be implicated more than the pudendal nerve since she has not seen much improvement following the prior nerve block. Discussed limitation of PT with this portion of the problem but how we will keep strengthening the core and hips to allow for decreased PFM spasms and try to minimize pressure on the spine.     Total time: 60 min.                              PT Short Term Goals - 09/25/19 1757      PT SHORT TERM GOAL #1   Title Patient will demonstrate coordinated diaphragmatic breathing  with pelvic tilts to demonstrate improved control of diaphragm and TA, to allow for further strengthening of core musculature and decreased pelvic floor spasm.    Baseline Pt. demonstrates breathing dysfunction and poor PFM coordination evidenced by anal manometry As of 2/22: Pt. continues to have restriction in her diaphragm and near T/L junction but is able to intentionally use diaphragmatic breathing through the available ROM.    Time 5    Period Weeks    Status Achieved    Target Date 04/20/19      PT SHORT TERM GOAL #2   Title Patient will report a reduction in pain to no greater than 6/10 over the prior week to demonstrate symptom improvement.    Baseline Pain is 10/10 at worst, 2/10 at best As of 2/22: Pt. had achieved this goal for 1 week as of 1/21 but may have infection or other insult that caused pain to increase again. As of 4/29: Pain high of 8/10 but has been over-doing her activity over the past week. As of 6/10: Pt. was able to keep pain at 6 or below since prior visit by  using TENS to help keep tension lower but has not had a full week to test if it will keep working.    Time 5    Period Weeks    Status On-going    Target Date 10/23/19      PT SHORT TERM GOAL #3   Title Patient will demonstrate HEP x1 in the clinic to demonstrate understanding and proper form to allow for further improvement.    Baseline Pt. lacks knowledge of therepeutic exercises that can decrease her pain/Sx.    Time 5    Period Weeks    Status Achieved    Target Date 04/20/19      PT SHORT TERM GOAL #4   Title Patient will report consistent use of foot-stool (squatty-potty) for positioning with BM to decrease pain with BM and intra-abdominal pressure.    Baseline Pt. having constipation due to PFM dysfunction    Time 5    Period Weeks    Status Achieved    Target Date 04/20/19             PT Long Term Goals - 09/25/19 0001      PT LONG TERM GOAL #1   Title Pt. will be able to participate in regular ADL's with least restrictive device without pain increasing greater than 2/10    Baseline Pt. limited in her ability to perform household duties by increased pain and fatigue. As of 4/29: Her legs have more endurance with walking before she gets a tremor. Still needs to sit down the majority of the time she is cooking. Still needs back support for prolonged sitting. As of 6/10: Pt. able to do for  ~1 hour before she needs to rest to prevent increased pain, pain is averaging ~ 4-5/10    Time 10    Period Weeks    Status On-going    Target Date 10/23/19      PT LONG TERM GOAL #2   Title Patient will score at or below 65/300  on the PFDI and 35% on the Female NIH-CPSI to demonstrate a clinically meaningful decrease in disability and distress due to pelvic floor dysfunction.    Baseline PFDI: 110/300, Female NIH-CPSI: 29/43 (67%) As of 4/29: 100/300 on PFDI and 30/43 on NIH-CPSI    Time 10    Period Weeks    Status Unable  to assess    Target Date 10/23/19      PT LONG TERM GOAL  #3   Title Patient will report no pain with intercourse to demonstrate improved functional ability.    Baseline Pt. Having significant pain with intercourse. As of 4/29: is a little better (20-30%) but is worse on days where overall tension/pain is worse. As of 6/10: can have days where there is not much pain but other days where there is significant pain.    Time 10    Period Weeks    Status On-going    Target Date 10/23/19      PT LONG TERM GOAL #4   Title Pt will report ability to work in her yard for greater than 30 minutes without extreme fatigue    Baseline limited to 10-15 minutes before pt requires ending activity. As of 4/29: ~ 20 min. As of 6/10: has not been gardening much but can do ~ 30 min of similar indoor activity level before increased fatigue.    Time 10    Period Weeks    Status Achieved    Target Date 10/23/19      PT LONG TERM GOAL #5   Title Pt. will be able to go 2-3 hours between emptying her bladder without increased pain and empty her bladder fully whether by urostomy, cath, or voluntary release.    Baseline Pt. has a urostomy but continues to have to self-catheterize. Has pain with bladder filling and when using catheter. As of 4/29: is doing better since starting to take the pregabalin andd baclofen but occasionally still has pain with bladder filling. It is bad when she has an infection or over-does activity, better on "normal" days which have been few and far between due to frequent UTI's. As of 6/10: Pt. has "good" and "bad" days but has more days where she can go 2-3 hours without increased pain    Time 10    Period Weeks    Status On-going    Target Date 10/23/19      PT LONG TERM GOAL #6   Title Patient will report having BM's at least every-other day with consistency between [facility] stool scale 3-5 over the prior week to demonstrate decreased constipation.    Baseline Pt. unable to have regula BM's without medication, manometry shows PFM dysfunction. As of  4/29: still having to use and enema or supository occasionally and still needing linzess. As of 6/10: Pt. has days where she can empty ok and days where she needs to use an Enema to stimulate a BM, feels that it is just the insertion of the enema, not the solution that does the job.    Time 10    Period Weeks    Status On-going    Target Date 10/23/19                 Plan - 10/07/19 1445    Clinical Impression Statement Christine Chavez responded well to using TENS near the T/L junction to help down-regulate pain over the past week but continues to demonstrate difficulty emptying bowels and had a couple days of having increased bladder spasms regardless of the fact that her pain was lower, her stress levels were lower, and she was watching what she ate. It appears that the pudendal nerve block has not done much in the way of allowing for reduced bladder spasm. I think it is possible that the pelvic or hypogastric nerves may be more to blame due  to the effect on both bowel and bladder being more bothersome than pelvic pain. It was interesting that when we were able to get good relaxation of the PFM using TPDN to the perenium the Pt. had increased bladder spasms as a result. It stands to reason that perhaps this was due to decreased checks on the bladder tension from the PFM tension. As discussed in her previous note, I believe Pt. will continue to benefit from skilled PT to increase strength and stability of the pelvis and core and allow for decreased PFM tension but it seems that we have been unable to affect whatever neurological issue is causing the increased tension at what I believe is the internal rather than external rectal and bladder spincters. I wonder if another type of nerve block or something like interstim might be more effective for this part of the problem.    PT Next Visit Plan --    PT Home Exercise Plan A day: low back stretch, side-stretch,  hamstring stretch, butterfly stretch, hip EXT in  prone with knee bent, side-lying hip ABD, calf raises.  B day: Thoracic extensions,  I's, Y's, T's, and W's, self internal TP release, child's pose stretch, DF and Eversion at the ankle. Every day: Pain diary, Posterior pelvic tilts with deep core activation, bow-and-arrow, TENS unit 3+ times per day for 30 min and as needed if pain starts to increase.    Consulted and Agree with Plan of Care Patient           Patient will benefit from skilled therapeutic intervention in order to improve the following deficits and impairments:     Visit Diagnosis: Other muscle spasm  Muscle weakness (generalized)  Difficulty in walking, not elsewhere classified     Problem List Patient Active Problem List   Diagnosis Date Noted  . Seizure ([place]) 11/11/2018  . Major depressive disorder, recurrent episode, moderate ([place]) 02/07/2018  . Nephrolithiasis 04/16/2016  . Numbness 07/28/2015  . Bladder retention 06/23/2015  . Abdominal pain 06/04/2015  . Dizziness 05/18/2015  . Neck pain 05/18/2015  . Complicated migraine 25/95/6387  . Other fatigue 04/28/2015  . Abnormal finding on MRI of brain 04/28/2015  . D (diarrhea) 03/29/2015  . H/O disease 03/29/2015  . Abnormal weight loss 03/29/2015  . Muscle weakness (generalized) 03/14/2015  . Headache, migraine 03/10/2015   Willa Rough DPT, ATC Willa Rough 10/08/2019, 10:35 AM  [place] MAIN [facility] SERVICES [address] [place], [place], [address] Phone: [phone]   Fax:  [phone]  Name: Christine Chavez MRN: [id number] Date of Birth: [birth year]

## 2019-10-09 ENCOUNTER — Ambulatory Visit: Payer: BC Managed Care – PPO

## 2019-10-09 ENCOUNTER — Other Ambulatory Visit: Payer: Self-pay

## 2019-10-09 DIAGNOSIS — M62838 Other muscle spasm: Secondary | ICD-10-CM

## 2019-10-09 DIAGNOSIS — R262 Difficulty in walking, not elsewhere classified: Secondary | ICD-10-CM

## 2019-10-09 DIAGNOSIS — M6281 Muscle weakness (generalized): Secondary | ICD-10-CM

## 2019-10-09 NOTE — Patient Instructions (Signed)
  Start by breathing out and feeling the low tummy and then the obliques pull in toward the center. Then hold the core as you let one knee come out to the side a little and then back to the center. Relax and re-set before repeating on the other side. Do 2 sets of 5 (left and right together count as one). Rest or stretch for at least 3-5 min. Between sets.

## 2019-10-09 NOTE — Therapy (Signed)
[facility] MAIN [facility] SERVICES [address] [place], [place], [address] Phone: [phone]   Fax:  [phone]  Physical Therapy Treatment  The patient has been informed of current processes in place at Outpatient Rehab to protect patients from Covid-19 exposure including social distancing, schedule modifications, and new cleaning procedures. After discussing their particular risk with a therapist based on the patient's personal risk factors, the patient has decided to proceed with in-person therapy.   Patient Details  Name: Christine Chavez MRN: [id number] Date of Birth: January 10, 1981 Referring Provider (PT): [name]   Encounter Date: 10/09/2019   PT End of Session - 10/09/19 1758    Visit Number 45    Number of Visits 53    Date for PT Re-Evaluation 10/23/19    Authorization Type [employer]    Authorization Time Period from 08/14/2019 through 10/23/2019    Authorization - Visit Number 12    Authorization - Number of Visits 20    Progress Note Due on Visit 53    PT Start Time 1435    PT Stop Time 1530    PT Time Calculation (min) 55 min    Activity Tolerance Patient tolerated treatment well;No increased pain    Behavior During Therapy WFL for tasks assessed/performed           Past Medical History:  Diagnosis Date  . Complication of anesthesia    ? seizures after anesthesia   . Headache   . Migraines   . Neurogenic bladder   . Renal disorder   . Vision abnormalities     Past Surgical History:  Procedure Laterality Date  . ANTERIOR CRUCIATE LIGAMENT REPAIR  1997  . APPENDECTOMY    . COLONOSCOPY WITH PROPOFOL N/A 11/11/2018   Procedure: COLONOSCOPY WITH PROPOFOL;  Surgeon: Christena Deem, MD;  Location: [facility] ENDOSCOPY;  Service: Endoscopy;  Laterality: N/A;  . CYSTOSCOPY WITH STENT PLACEMENT Right 04/17/2016   Procedure: CYSTOSCOPY WITH STENT PLACEMENT;  Surgeon: Malen Gauze, MD;  Location: [facility] ORS;  Service:  Urology;  Laterality: Right;  . ESOPHAGOGASTRODUODENOSCOPY (EGD) WITH PROPOFOL N/A 11/11/2018   Procedure: ESOPHAGOGASTRODUODENOSCOPY (EGD) WITH PROPOFOL;  Surgeon: Christena Deem, MD;  Location: [facility] ENDOSCOPY;  Service: Endoscopy;  Laterality: N/A;  . EXPLORATORY LAPAROTOMY  1999  . KIDNEY STONE SURGERY  04/2016  . REVISION UROSTOMY CUTANEOUS    . REVISION UROSTOMY CUTANEOUS  01/10/2018  . SUPRAPUBIC CATHETER PLACEMENT  08/2017  . TONSILLECTOMY      There were no vitals filed for this visit.  Pelvic Floor Physical Therapy Treatment Note  SCREENING  Changes in medications, allergies, or medical history?:   SUBJECTIVE  Patient reports: The pain medicine MD did feel like doing a block at the superior hypogastric nerve could help. The nerve block will be 7/20. They also want to do another urodynamics study.    Precautions:  Neurological diagnosis pending, has "non-specific" brain lesions   Pain update:  Location of pain: L Flank pain (pelvic area) Current pain:  4/10 (3/10, increases with motion) Max pain:  4/10 (4/10 when it spasms) Least pain:  3/10 (3/10) Nature of pain: sharp to dull ache  **no increased pain following session  Patient Goals: Get rid of the discomfort with catheterizing and ache/bladder pain. Be able to take fewer medications and be able to enjoy spending time with her family again, doing "all the mom things"    OBJECTIVE  Changes in:  Observations/Posture:  L lumbar curve  prominent with transverse processes more prominent on the L. (from previous note)  Range of Motion/Flexibilty:  (from 12/10) Spine: ~ 2-3 fingers from knee with SB B. ~ 50% reduced R rotation with stretch in L, ~ 75% reduced L rotation with increased tightness. Forward bend: 19 in.from floor. Hips: Unable to achieve neutral pelvis in long-sitting due to hamstring tightness.   Strength/MMT:  LE MMT (from 12-10) LE MMT Left Right  Hip flex:  (L2) /5 /5  Hip ext: 3+/5  3+/5  Hip abd: 4/5 4/5  Hip add: 4/5 3+/5  Hip IR 4/5 3+/5  Hip ER 4/5 4/5   (From 3/24): Pt. Demonstrates 3/5 strength with "I's, Y's, and T's" but 4/5 with W's. (from 6/22) DF: 9 reps with B. Eversion/Inversion: 4/5 on R, 4+/5 on L. DF: L 4+/5, R 3+/5  Seated IR/ER on L: 4/5, R 3+/5  Balance/stability: Pt. Able to maintain upright posture with no UE support and slow deliberate perturbations for ~ 2 min with increasing stability and 1 min. With increased speed of direction change though greater variation from the center occurred and it took longer for correction and improved by ~ 50% over the time performed. (from prior visit)  Pelvic Floor External Exam: (From prior session)- [Introitus Appears: elevated Skin integrity: WNL Palpation: TTP to B STP Cough: limited motion Prolapse visible?: no Scar mobility: not assessed  Internal Vaginal Exam: Strength (PERF): Pt. Has difficulty sensing the PFM to generate contraction or relaxation due to high tone/neuro involvement Symmetry: L>R for spasm/TTP Palpation: TTP to all muscles throughout B Prolapse: none]   Abdominal:  Pt. Able to recruit TA and glutes but is over-emphasizing glutes and struggling to engage obliques.  **improved recruitment following diaphragm release.  Palpation: TTP to B diaphragm   Gait Analysis:  INTERVENTIONS THIS SESSION:  Therex: Reviewed and practiced posterior pelvic tilts with emphasis on using TA/Obliques and not over-using the glutes and Practiced ankle Eversion and dorsiflexion strengthening exercises with yellow band to improve strength/stability and to give input into the L5-S2 nerve roots to help increase overall hip stability too and allow for decreased PFM tension.  Self-care: Discussed what Pt's MD's had told her about the plan to do another nerve block and urodynamics study. Discussed the potential to try Uqora for chronic UTI's and encouraged to ask her MD's what they think.   Manual:  Performed TP release to B diaphragm to improve ability to recruit obliques with deep-core/posterior pelvic tilt exercise.  Total time: 55 min.                               PT Short Term Goals - 09/25/19 1757      PT SHORT TERM GOAL #1   Title Patient will demonstrate coordinated diaphragmatic breathing with pelvic tilts to demonstrate improved control of diaphragm and TA, to allow for further strengthening of core musculature and decreased pelvic floor spasm.    Baseline Pt. demonstrates breathing dysfunction and poor PFM coordination evidenced by anal manometry As of 2/22: Pt. continues to have restriction in her diaphragm and near T/L junction but is able to intentionally use diaphragmatic breathing through the available ROM.    Time 5    Period Weeks    Status Achieved    Target Date 04/20/19      PT SHORT TERM GOAL #2   Title Patient will report a reduction in pain to no greater than 6/10 over  the prior week to demonstrate symptom improvement.    Baseline Pain is 10/10 at worst, 2/10 at best As of 2/22: Pt. had achieved this goal for 1 week as of 1/21 but may have infection or other insult that caused pain to increase again. As of 4/29: Pain high of 8/10 but has been over-doing her activity over the past week. As of 6/10: Pt. was able to keep pain at 6 or below since prior visit by using TENS to help keep tension lower but has not had a full week to test if it will keep working.    Time 5    Period Weeks    Status On-going    Target Date 10/23/19      PT SHORT TERM GOAL #3   Title Patient will demonstrate HEP x1 in the clinic to demonstrate understanding and proper form to allow for further improvement.    Baseline Pt. lacks knowledge of therepeutic exercises that can decrease her pain/Sx.    Time 5    Period Weeks    Status Achieved    Target Date 04/20/19      PT SHORT TERM GOAL #4   Title Patient will report consistent use of foot-stool  (squatty-potty) for positioning with BM to decrease pain with BM and intra-abdominal pressure.    Baseline Pt. having constipation due to PFM dysfunction    Time 5    Period Weeks    Status Achieved    Target Date 04/20/19             PT Long Term Goals - 09/25/19 0001      PT LONG TERM GOAL #1   Title Pt. will be able to participate in regular ADL's with least restrictive device without pain increasing greater than 2/10    Baseline Pt. limited in her ability to perform household duties by increased pain and fatigue. As of 4/29: Her legs have more endurance with walking before she gets a tremor. Still needs to sit down the majority of the time she is cooking. Still needs back support for prolonged sitting. As of 6/10: Pt. able to do for  ~1 hour before she needs to rest to prevent increased pain, pain is averaging ~ 4-5/10    Time 10    Period Weeks    Status On-going    Target Date 10/23/19      PT LONG TERM GOAL #2   Title Patient will score at or below 65/300  on the PFDI and 35% on the Female NIH-CPSI to demonstrate a clinically meaningful decrease in disability and distress due to pelvic floor dysfunction.    Baseline PFDI: 110/300, Female NIH-CPSI: 29/43 (67%) As of 4/29: 100/300 on PFDI and 30/43 on NIH-CPSI    Time 10    Period Weeks    Status Unable to assess    Target Date 10/23/19      PT LONG TERM GOAL #3   Title Patient will report no pain with intercourse to demonstrate improved functional ability.    Baseline Pt. Having significant pain with intercourse. As of 4/29: is a little better (20-30%) but is worse on days where overall tension/pain is worse. As of 6/10: can have days where there is not much pain but other days where there is significant pain.    Time 10    Period Weeks    Status On-going    Target Date 10/23/19      PT LONG TERM GOAL #4  Title Pt will report ability to work in her yard for greater than 30 minutes without extreme fatigue    Baseline  limited to 10-15 minutes before pt requires ending activity. As of 4/29: ~ 20 min. As of 6/10: has not been gardening much but can do ~ 30 min of similar indoor activity level before increased fatigue.    Time 10    Period Weeks    Status Achieved    Target Date 10/23/19      PT LONG TERM GOAL #5   Title Pt. will be able to go 2-3 hours between emptying her bladder without increased pain and empty her bladder fully whether by urostomy, cath, or voluntary release.    Baseline Pt. has a urostomy but continues to have to self-catheterize. Has pain with bladder filling and when using catheter. As of 4/29: is doing better since starting to take the pregabalin andd baclofen but occasionally still has pain with bladder filling. It is bad when she has an infection or over-does activity, better on "normal" days which have been few and far between due to frequent UTI's. As of 6/10: Pt. has "good" and "bad" days but has more days where she can go 2-3 hours without increased pain    Time 10    Period Weeks    Status On-going    Target Date 10/23/19      PT LONG TERM GOAL #6   Title Patient will report having BM's at least every-other day with consistency between [facility] stool scale 3-5 over the prior week to demonstrate decreased constipation.    Baseline Pt. unable to have regula BM's without medication, manometry shows PFM dysfunction. As of 4/29: still having to use and enema or supository occasionally and still needing linzess. As of 6/10: Pt. has days where she can empty ok and days where she needs to use an Enema to stimulate a BM, feels that it is just the insertion of the enema, not the solution that does the job.    Time 10    Period Weeks    Status On-going    Target Date 10/23/19                 Plan - 10/09/19 1758    Clinical Impression Statement Pt. Responded well to all interventions today, demonstrating improved ability to recruit obliques and better HEP performance as well as  understanding and correct performance of all education and exercises provided today. They will continue to benefit from skilled physical therapy to work toward remaining goals and maximize function as well as decrease likelihood of symptom increase or recurrence.     PT Next Visit Plan try TENS near coccyx, focus on graded activity, and seated and standing balance/core strength. review I's Y's and T's in prone, thoracic mobility? perform further release and PNF for R>L shoulder blades, more scar-release and cupping at suprapubic scar, MFR around B ilium. TPDN to multifidus through T-L  junction with E-stim at 40hz  and at 2 hz. continue to work on thoracolumbar rotation ROM and decreasing spasms, add SLS balance training, begin strengthening for reciprocal inhibition.    PT Home Exercise Plan A day: low back stretch, side-stretch,  hamstring stretch, butterfly stretch, hip EXT in prone with knee bent, side-lying hip ABD, calf raises.  B day: Thoracic extensions,  I's, Y's, T's, and W's, self internal TP release, child's pose stretch, DF and Eversion at the ankle. Every day: Pain diary, Posterior pelvic tilts with deep core  activation and knee fall-out, bow-and-arrow, TENS unit 3+ times per day for 30 min and as needed if pain starts to increase.    Consulted and Agree with Plan of Care Patient           Patient will benefit from skilled therapeutic intervention in order to improve the following deficits and impairments:     Visit Diagnosis: Other muscle spasm  Muscle weakness (generalized)  Difficulty in walking, not elsewhere classified     Problem List Patient Active Problem List   Diagnosis Date Noted  . Seizure (HCC) 11/11/2018  . Major depressive disorder, recurrent episode, moderate (HCC) 02/07/2018  . Nephrolithiasis 04/16/2016  . Numbness 07/28/2015  . Bladder retention 06/23/2015  . Abdominal pain 06/04/2015  . Dizziness 05/18/2015  . Neck pain 05/18/2015  . Complicated  migraine 04/28/2015  . Other fatigue 04/28/2015  . Abnormal finding on MRI of brain 04/28/2015  . D (diarrhea) 03/29/2015  . H/O disease 03/29/2015  . Abnormal weight loss 03/29/2015  . Muscle weakness (generalized) 03/14/2015  . Headache, migraine 03/10/2015   Cleophus MoltKeeli T. Minal Stuller DPT, ATC Cleophus MoltKeeli T Jemel Ono 10/09/2019, 6:05 PM  [facility] MAIN [facility] [address] [place], [place], [address] Phone: [phone]   Fax:  [phone]  Name: Christine Chavez MRN: [id number] Date of Birth: [birth year]

## 2019-10-14 ENCOUNTER — Ambulatory Visit: Payer: BC Managed Care – PPO

## 2019-10-14 ENCOUNTER — Other Ambulatory Visit: Payer: Self-pay

## 2019-10-14 DIAGNOSIS — M62838 Other muscle spasm: Secondary | ICD-10-CM | POA: Diagnosis not present

## 2019-10-14 DIAGNOSIS — R262 Difficulty in walking, not elsewhere classified: Secondary | ICD-10-CM

## 2019-10-14 DIAGNOSIS — M6281 Muscle weakness (generalized): Secondary | ICD-10-CM

## 2019-10-14 NOTE — Patient Instructions (Addendum)
Ashwagandha for stress  Magnesium capsules (helps with constipation) or topical (less GI Sx-8 sheep organics)  Uqora for UTI prevention (D-mannose)  Look into cooling garments for MS.  Keep track of your pain/fatigue diary and note whether it was hot/if you were out. Wear your ab wrap and your posture brace when you are up and about for ~ 15-30 min. Increments.    Put the red band under your back near your bra-strap level and exhale as you do your normal pelvic tilt and press up with your arms near the end of your breath to feel this help you engage the obliques (pulling the ribcage in/together)

## 2019-10-14 NOTE — Therapy (Signed)
[facility] [address] [place], [place], [address] Phone: [phone]   Fax:  [phone]  Physical Therapy Treatment  The patient has been informed of current processes in place at Outpatient Rehab to protect patients from Covid-19 exposure including social distancing, schedule modifications, and new cleaning procedures. After discussing their particular risk with a therapist based on the patient's personal risk factors, the patient has decided to proceed with in-person therapy.   Patient Details  Name: Christine MylarMelissa Dawn Chavez MRN: [id number] Date of Birth: 1980-11-02 Referring Provider (PT): Lexine BatonLondon, Hartman [name]   Encounter Date: 10/14/2019   PT End of Session - 10/15/19 0749    Visit Number 46    Number of Visits 53    Date for PT Re-Evaluation 10/23/19    Authorization Type [employer]    Authorization Time Period from 08/14/2019 through 10/23/2019    Authorization - Visit Number 13    Authorization - Number of Visits 20    Progress Note Due on Visit 53    PT Start Time 1400    PT Stop Time 1500    PT Time Calculation (min) 60 min    Activity Tolerance Patient tolerated treatment well;No increased pain    Behavior During Therapy WFL for tasks assessed/performed           Past Medical History:  Diagnosis Date  . Complication of anesthesia    ? seizures after anesthesia   . Headache   . Migraines   . Neurogenic bladder   . Renal disorder   . Vision abnormalities     Past Surgical History:  Procedure Laterality Date  . ANTERIOR CRUCIATE LIGAMENT REPAIR  1997  . APPENDECTOMY    . COLONOSCOPY WITH PROPOFOL N/A 11/11/2018   Procedure: COLONOSCOPY WITH PROPOFOL;  Surgeon: Christena DeemSkulskie, Martin U, MD;  [facility];  Service: Endoscopy;  Laterality: N/A;  . CYSTOSCOPY WITH STENT PLACEMENT Right 04/17/2016   Procedure: CYSTOSCOPY WITH STENT PLACEMENT;  Surgeon: Malen GauzePatrick L McKenzie, MD;  Location: [facility] ORS;  Service:  Urology;  Laterality: Right;  . ESOPHAGOGASTRODUODENOSCOPY (EGD) WITH PROPOFOL N/A 11/11/2018   Procedure: ESOPHAGOGASTRODUODENOSCOPY (EGD) WITH PROPOFOL;  Surgeon: Christena DeemSkulskie, Martin U, MD;  [facility];  Service: Endoscopy;  Laterality: N/A;  . EXPLORATORY LAPAROTOMY  1999  . KIDNEY STONE SURGERY  04/2016  . REVISION UROSTOMY CUTANEOUS    . REVISION UROSTOMY CUTANEOUS  01/10/2018  . SUPRAPUBIC CATHETER PLACEMENT  08/2017  . TONSILLECTOMY      There were no vitals filed for this visit.   Pelvic Floor Physical Therapy Treatment Note  SCREENING  Changes in medications, allergies, or medical history?:   SUBJECTIVE  Patient reports: "some things are kind of steady but she is getting that "not good sensation" when she is trying to empty her bladder. She is trying to to get too focused on it but thinks it may be an infection again. Her back is "not too bad". Her energy level is "completely zapped. She has been depressed. Is going to try a new counselor. She is wondering if she is having sleep apnea, has started snoring. Her vitamin D levels are low. Saturday she over-did it. She got too hot. She had a surprise birthday party.   Precautions:  Neurological diagnosis pending, has "non-specific" brain lesions   Pain update:  Location of pain: L Flank pain (pelvic area) Current pain:  3/10 (4/10, increases with motion) Max pain:  4/10 (6/10 when it spasms) Least pain:  1/10 (4/10) Nature of pain: sharp to dull ache (grabbing)  **no increased pain but some increased urgency following treatment.  Patient Goals: Get rid of the discomfort with catheterizing and ache/bladder pain. Be able to take fewer medications and be able to enjoy spending time with her family again, doing "all the mom things"    OBJECTIVE  Changes in:  Observations/Posture:  L lumbar curve prominent with transverse processes more prominent on the L. (from previous note)  Range of Motion/Flexibilty:   (from 12/10) Spine: ~ 2-3 fingers from knee with SB B. ~ 50% reduced R rotation with stretch in L, ~ 75% reduced L rotation with increased tightness. Forward bend: 19 in.from floor. Hips: Unable to achieve neutral pelvis in long-sitting due to hamstring tightness.  Today: decreased mobility through T/L junction.  Strength/MMT:  LE MMT (from 12-10) LE MMT Left Right  Hip flex:  (L2) /5 /5  Hip ext: 3+/5 3+/5  Hip abd: 4/5 4/5  Hip add: 4/5 3+/5  Hip IR 4/5 3+/5  Hip ER 4/5 4/5   (From 3/24): Pt. Demonstrates 3/5 strength with "I's, Y's, and T's" but 4/5 with W's. (from 6/22) DF: 9 reps with B. Eversion/Inversion: 4/5 on R, 4+/5 on L. DF: L 4+/5, R 3+/5  Seated IR/ER on L: 4/5, R 3+/5  Balance/stability: Pt. Able to maintain upright posture with no UE support and slow deliberate perturbations for ~ 2 min with increasing stability and 1 min. With increased speed of direction change though greater variation from the center occurred and it took longer for correction and improved by ~ 50% over the time performed. (from prior visit)  Today: Pt. Able to achieve good posture without cueing but only able to maintain for ~ 2.5 min. Before seeking stability from the table and forward rolled shoulders. With cue to engage the deep core, Pt. Recognizes increased stability and comfort.  Pelvic Floor External Exam: (From prior session)- [Introitus Appears: elevated Skin integrity: WNL Palpation: TTP to B STP Cough: limited motion Prolapse visible?: no Scar mobility: not assessed  Internal Vaginal Exam: Strength (PERF): Pt. Has difficulty sensing the PFM to generate contraction or relaxation due to high tone/neuro involvement Symmetry: L>R for spasm/TTP Palpation: TTP to all muscles throughout B Prolapse: none]   Abdominal:  Pt. Demonstrated return of diaphragm spasms and difficulty recruiting Obliques.  **improved recruitment following diaphragm and multifidus  release.  Palpation: TTP to B diaphragm, Multifidus near T-L junction on L>R   Gait Analysis:  INTERVENTIONS THIS SESSION:  Therex: Reviewed and practiced posterior pelvic tilts with emphasis on using TA/Obliques with the addition of a red band chest-press and not over-using the glutes and to improve strength/stability to help increase overall hip and pelvic stability and allow for decreased PFM tension.  Self-care: Discussed what the different options she has right now for supplements and other supportive devices, encouraged Pt. To follow through on her plan to address her mental health concerns. Reviewed cooling devised and abdominal and shoulder support to maximize her efficiency and energy as well as tracking her energy for improved energy conservation/ pain science application.  Manual: Performed TP release to B diaphragm, Multifidus near T-L junction on L>R followed by grade 3-4 PA mobs to ~ T12-L1 to improve ability to recruit obliques with deep-core/posterior pelvic tilt exercise.  Total time: 60 min.  PT Short Term Goals - 09/25/19 1757      PT SHORT TERM GOAL #1   Title Patient will demonstrate coordinated diaphragmatic breathing with pelvic tilts to demonstrate improved control of diaphragm and TA, to allow for further strengthening of core musculature and decreased pelvic floor spasm.    Baseline Pt. demonstrates breathing dysfunction and poor PFM coordination evidenced by anal manometry As of 2/22: Pt. continues to have restriction in her diaphragm and near T/L junction but is able to intentionally use diaphragmatic breathing through the available ROM.    Time 5    Period Weeks    Status Achieved    Target Date 04/20/19      PT SHORT TERM GOAL #2   Title Patient will report a reduction in pain to no greater than 6/10 over the prior week to demonstrate symptom improvement.    Baseline Pain is 10/10 at worst, 2/10 at  best As of 2/22: Pt. had achieved this goal for 1 week as of 1/21 but may have infection or other insult that caused pain to increase again. As of 4/29: Pain high of 8/10 but has been over-doing her activity over the past week. As of 6/10: Pt. was able to keep pain at 6 or below since prior visit by using TENS to help keep tension lower but has not had a full week to test if it will keep working.    Time 5    Period Weeks    Status On-going    Target Date 10/23/19      PT SHORT TERM GOAL #3   Title Patient will demonstrate HEP x1 in the clinic to demonstrate understanding and proper form to allow for further improvement.    Baseline Pt. lacks knowledge of therepeutic exercises that can decrease her pain/Sx.    Time 5    Period Weeks    Status Achieved    Target Date 04/20/19      PT SHORT TERM GOAL #4   Title Patient will report consistent use of foot-stool (squatty-potty) for positioning with BM to decrease pain with BM and intra-abdominal pressure.    Baseline Pt. having constipation due to PFM dysfunction    Time 5    Period Weeks    Status Achieved    Target Date 04/20/19             PT Long Term Goals - 09/25/19 0001      PT LONG TERM GOAL #1   Title Pt. will be able to participate in regular ADL's with least restrictive device without pain increasing greater than 2/10    Baseline Pt. limited in her ability to perform household duties by increased pain and fatigue. As of 4/29: Her legs have more endurance with walking before she gets a tremor. Still needs to sit down the majority of the time she is cooking. Still needs back support for prolonged sitting. As of 6/10: Pt. able to do for  ~1 hour before she needs to rest to prevent increased pain, pain is averaging ~ 4-5/10    Time 10    Period Weeks    Status On-going    Target Date 10/23/19      PT LONG TERM GOAL #2   Title Patient will score at or below 65/300  on the PFDI and 35% on the Female NIH-CPSI to demonstrate a  clinically meaningful decrease in disability and distress due to pelvic floor dysfunction.    Baseline PFDI: 110/300, Female NIH-CPSI:  29/43 (67%) As of 4/29: 100/300 on PFDI and 30/43 on NIH-CPSI    Time 10    Period Weeks    Status Unable to assess    Target Date 10/23/19      PT LONG TERM GOAL #3   Title Patient will report no pain with intercourse to demonstrate improved functional ability.    Baseline Pt. Having significant pain with intercourse. As of 4/29: is a little better (20-30%) but is worse on days where overall tension/pain is worse. As of 6/10: can have days where there is not much pain but other days where there is significant pain.    Time 10    Period Weeks    Status On-going    Target Date 10/23/19      PT LONG TERM GOAL #4   Title Pt will report ability to work in her yard for greater than 30 minutes without extreme fatigue    Baseline limited to 10-15 minutes before pt requires ending activity. As of 4/29: ~ 20 min. As of 6/10: has not been gardening much but can do ~ 30 min of similar indoor activity level before increased fatigue.    Time 10    Period Weeks    Status Achieved    Target Date 10/23/19      PT LONG TERM GOAL #5   Title Pt. will be able to go 2-3 hours between emptying her bladder without increased pain and empty her bladder fully whether by urostomy, cath, or voluntary release.    Baseline Pt. has a urostomy but continues to have to self-catheterize. Has pain with bladder filling and when using catheter. As of 4/29: is doing better since starting to take the pregabalin andd baclofen but occasionally still has pain with bladder filling. It is bad when she has an infection or over-does activity, better on "normal" days which have been few and far between due to frequent UTI's. As of 6/10: Pt. has "good" and "bad" days but has more days where she can go 2-3 hours without increased pain    Time 10    Period Weeks    Status On-going    Target Date 10/23/19       PT LONG TERM GOAL #6   Title Patient will report having BM's at least every-other day with consistency between [facility] stool scale 3-5 over the prior week to demonstrate decreased constipation.    Baseline Pt. unable to have regula BM's without medication, manometry shows PFM dysfunction. As of 4/29: still having to use and enema or supository occasionally and still needing linzess. As of 6/10: Pt. has days where she can empty ok and days where she needs to use an Enema to stimulate a BM, feels that it is just the insertion of the enema, not the solution that does the job.    Time 10    Period Weeks    Status On-going    Target Date 10/23/19                 Plan - 10/15/19 0749    Clinical Impression Statement Pt. Responded well to all interventions today, demonstrating improved deep-core control, decreased spasm, increased T-L junction mobility, as well as understanding and correct performance of all education and exercises provided today. They will continue to benefit from skilled physical therapy to work toward remaining goals and maximize function as well as decrease likelihood of symptom increase or recurrence.    PT Next Visit Plan try TENS  near coccyx, focus on graded activity, and seated and standing balance/core strength. review I's Y's and T's in prone, thoracic mobility? perform further release and PNF for R>L shoulder blades, more scar-release and cupping at suprapubic scar, MFR around B ilium. TPDN to multifidus through T-L  junction with E-stim at  and at 2 hz. continue to work on thoracolumbar rotation ROM and decreasing spasms, add SLS balance training, begin strengthening for reciprocal inhibition.    PT Home Exercise Plan A day: low back stretch, side-stretch,  hamstring stretch, butterfly stretch, hip EXT in prone with knee bent, side-lying hip ABD, calf raises.  B day: Thoracic extensions,  I's, Y's, T's, and W's, self internal TP release, child's pose stretch, DF  and Eversion at the ankle. Every day: Pain diary, Posterior pelvic tilts with deep core activation and red band for obliques activation knee fall-out, bow-and-arrow, TENS unit 3+ times per day for 30 min and as needed if pain starts to increase.    Consulted and Agree with Plan of Care Patient           Patient will benefit from skilled therapeutic intervention in order to improve the following deficits and impairments:     Visit Diagnosis: Other muscle spasm  Muscle weakness (generalized)  Difficulty in walking, not elsewhere classified     Problem List Patient Active Problem List   Diagnosis Date Noted  . Seizure (HCC) 11/11/2018  . Major depressive disorder, recurrent episode, moderate (HCC) 02/07/2018  . Nephrolithiasis 04/16/2016  . Numbness 07/28/2015  . Bladder retention 06/23/2015  . Abdominal pain 06/04/2015  . Dizziness 05/18/2015  . Neck pain 05/18/2015  . Complicated migraine 04/28/2015  . Other fatigue 04/28/2015  . Abnormal finding on MRI of brain 04/28/2015  . D (diarrhea) 03/29/2015  . H/O disease 03/29/2015  . Abnormal weight loss 03/29/2015  . Muscle weakness (generalized) 03/14/2015  . Headache, migraine 03/10/2015   Cleophus Molt DPT, ATC Cleophus Molt 10/15/2019, 8:02 AM  [facility] MAIN [facility] SERVICES [address] [place], [place], [address] Phone: [phone]   Fax:  [phone]  Name: Mattisen Pohlmann MRN: [id number] Date of Birth: [birth year]

## 2019-10-16 ENCOUNTER — Ambulatory Visit: Payer: BC Managed Care – PPO | Attending: Gastroenterology

## 2019-10-16 ENCOUNTER — Other Ambulatory Visit: Payer: Self-pay

## 2019-10-16 DIAGNOSIS — M62838 Other muscle spasm: Secondary | ICD-10-CM | POA: Insufficient documentation

## 2019-10-16 DIAGNOSIS — M6281 Muscle weakness (generalized): Secondary | ICD-10-CM | POA: Diagnosis present

## 2019-10-16 DIAGNOSIS — R262 Difficulty in walking, not elsewhere classified: Secondary | ICD-10-CM | POA: Insufficient documentation

## 2019-10-16 NOTE — Therapy (Signed)
[facility] MAIN [facility] SERVICES [address] [place], [place], [address] Phone: [phone]   Fax:  [phone]  Physical Therapy Treatment  The patient has been informed of current processes in place at Outpatient Rehab to protect patients from Covid-19 exposure including social distancing, schedule modifications, and new cleaning procedures. After discussing their particular risk with a therapist based on the patient's personal risk factors, the patient has decided to proceed with in-person therapy.   Patient Details  Name: Christine Chavez MRN: [id number] Date of Birth: January 30, 1981 Referring Provider (PT): Christine Chavez   Encounter Date: 10/16/2019   PT End of Session - 10/16/19 1535    Visit Number 47    Number of Visits 53    Date for PT Re-Evaluation 10/23/19    Authorization Type [employer]    Authorization Time Period from 08/14/2019 through 10/23/2019    Authorization - Visit Number 14    Authorization - Number of Visits 20    Progress Note Due on Visit 53    PT Start Time 1430    PT Stop Time 1530    PT Time Calculation (min) 60 min    Activity Tolerance Patient tolerated treatment well;No increased pain    Behavior During Therapy WFL for tasks assessed/performed           Past Medical History:  Diagnosis Date  . Complication of anesthesia    ? seizures after anesthesia   . Headache   . Migraines   . Neurogenic bladder   . Renal disorder   . Vision abnormalities     Past Surgical History:  Procedure Laterality Date  . ANTERIOR CRUCIATE LIGAMENT REPAIR  1997  . APPENDECTOMY    . COLONOSCOPY WITH PROPOFOL N/A 11/11/2018   Procedure: COLONOSCOPY WITH PROPOFOL;  Surgeon: Christine Deem, MD;  Location: [facility] ENDOSCOPY;  Service: Endoscopy;  Laterality: N/A;  . CYSTOSCOPY WITH STENT PLACEMENT Right 04/17/2016   Procedure: CYSTOSCOPY WITH STENT PLACEMENT;  Surgeon: Christine Gauze, MD;  Location: [facility] ORS;  Service:  Urology;  Laterality: Right;  . ESOPHAGOGASTRODUODENOSCOPY (EGD) WITH PROPOFOL N/A 11/11/2018   Procedure: ESOPHAGOGASTRODUODENOSCOPY (EGD) WITH PROPOFOL;  Surgeon: Christine Deem, MD;  Location: [facility] ENDOSCOPY;  Service: Endoscopy;  Laterality: N/A;  . EXPLORATORY LAPAROTOMY  1999  . KIDNEY STONE SURGERY  04/2016  . REVISION UROSTOMY CUTANEOUS    . REVISION UROSTOMY CUTANEOUS  01/10/2018  . SUPRAPUBIC CATHETER PLACEMENT  08/2017  . TONSILLECTOMY      There were no vitals filed for this visit.  Pelvic Floor Physical Therapy Treatment Note  SCREENING  Changes in medications, allergies, or medical history?: none  SUBJECTIVE  Patient reports: Still feeling some of the "UTI feeling" at the end of urinating. Did more sitting over the last couple days. Used her posture brace when she started the day, had her back supported. Did her tilts and feels like she was able to recruit her Obliques some. Has been tired, tried to take it easy but thinks that she is probably getting a UTI. Today after eating lunch ut with her pastor, she had a very hard time walking back to the car with her walker because her LLE was "out of control"  Precautions:  Neurological diagnosis pending, has "non-specific" brain lesions   Pain update:  Location of pain: L Flank pain (pelvic area) Current pain:  5/10 (4/10, increases with motion) Max pain:  6/10 (6/10 when it spasms) Least pain:  2/10 (4/10) [name]  of pain: sharp to dull ache (grabbing)  **decreased TTP but sore still from treatment   Patient Goals: Get rid of the discomfort with catheterizing and ache/bladder pain. Be able to take fewer medications and be able to enjoy spending time with her family again, doing "all the mom things"    OBJECTIVE  Changes in:  Observations/Posture:  L lumbar curve prominent with transverse processes more prominent on the L. (from previous note)  Range of Motion/Flexibilty:  (from 12/10) Spine: ~ 2-3 fingers  from knee with SB B. ~ 50% reduced R rotation with stretch in L, ~ 75% reduced L rotation with increased tightness. Forward bend: 19 in.from floor. Hips: Unable to achieve neutral pelvis in long-sitting due to hamstring tightness.   Strength/MMT:  LE MMT (from 12-10) LE MMT Left Right  Hip flex:  (L2) /5 /5  Hip ext: 3+/5 3+/5  Hip abd: 4/5 4/5  Hip add: 4/5 3+/5  Hip IR 4/5 3+/5  Hip ER 4/5 4/5   (From 3/24): Pt. Demonstrates 3/5 strength with "I's, Y's, and T's" but 4/5 with W's. (from 6/22) DF: 9 reps with B. Eversion/Inversion: 4/5 on R, 4+/5 on L. DF: L 4+/5, R 3+/5  Seated IR/ER on L: 4/5, R 3+/5  Balance/stability:  (6-29) Pt. Able to achieve good posture without cueing but only able to maintain for ~ 2.5 min. Before seeking stability from the table and forward rolled shoulders. With cue to engage the deep core, Pt. Recognizes increased stability and comfort.  Today: Pt. Not able to evenly weight bear through LLE, having increased cogwheel rigidity and tone. She needs MIN assist for 3 steps from W/C to treatment table.  Pelvic Floor External Exam: (From prior session)- [Introitus Appears: elevated Skin integrity: WNL Palpation: TTP to B STP Cough: limited motion Prolapse visible?: no Scar mobility: not assessed  Internal Vaginal Exam: Strength (PERF): Pt. Has difficulty sensing the PFM to generate contraction or relaxation due to high tone/neuro involvement Symmetry: L>R for spasm/TTP Palpation: TTP to all muscles throughout B Prolapse: none]   Abdominal:  Pt. Demonstrated ability to recruit R>L obliques and TA with pelvic tilts.  ** mild improvement in recruitment following TP release   Palpation: TTP to L intercostals and serratus anterior and paraspinals near T-L junction.  Gait Analysis:  INTERVENTIONS THIS SESSION:  Therex: Reviewed and practiced posterior pelvic tilts with emphasis on using TA/Obliques and trying to recruit the L as much as the R to  improve strength/stability to help increase overall hip and pelvic stability and allow for decreased PFM tension.  Self-care: Discussed the importance of not waiting to discuss her concerns with her MD's about the UTI potential and the LLE weakness, encouraged to push to have the MRI done since she seems to be having new/worsening Sx. In some ways even though she was making strides in her standing and walking ability.  Dry-needle: Performed TPDN with a .30x65mm needle and standard approach as described below to decrease spasm and pain and allow for improved balance of musculature for improved function and decreased symptoms.  Manual: Performed TP release to L intercostals and serratus anterior and paraspinals near T-L junction  to improve ability to recruit obliques with deep-core/posterior pelvic tilt exercise.  Total time: 60 min.                     Trigger Point Dry Needling - 10/17/19 0001    Consent Given? Yes    Education Handout Provided No  Muscles Treated Back/Hip Erector spinae    Dry Needling Comments left    Erector spinae Response Twitch response elicited;Palpable increased muscle length                  PT Short Term Goals - 09/25/19 1757      PT SHORT TERM GOAL #1   Title Patient will demonstrate coordinated diaphragmatic breathing with pelvic tilts to demonstrate improved control of diaphragm and TA, to allow for further strengthening of core musculature and decreased pelvic floor spasm.    Baseline Pt. demonstrates breathing dysfunction and poor PFM coordination evidenced by anal manometry As of 2/22: Pt. continues to have restriction in her diaphragm and near T/L junction but is able to intentionally use diaphragmatic breathing through the available ROM.    Time 5    Period Weeks    Status Achieved    Target Date 04/20/19      PT SHORT TERM GOAL #2   Title Patient will report a reduction in pain to no greater than 6/10 over the prior week  to demonstrate symptom improvement.    Baseline Pain is 10/10 at worst, 2/10 at best As of 2/22: Pt. had achieved this goal for 1 week as of 1/21 but may have infection or other insult that caused pain to increase again. As of 4/29: Pain high of 8/10 but has been over-doing her activity over the past week. As of 6/10: Pt. was able to keep pain at 6 or below since prior visit by using TENS to help keep tension lower but has not had a full week to test if it will keep working.    Time 5    Period Weeks    Status On-going    Target Date 10/23/19      PT SHORT TERM GOAL #3   Title Patient will demonstrate HEP x1 in the clinic to demonstrate understanding and proper form to allow for further improvement.    Baseline Pt. lacks knowledge of therepeutic exercises that can decrease her pain/Sx.    Time 5    Period Weeks    Status Achieved    Target Date 04/20/19      PT SHORT TERM GOAL #4   Title Patient will report consistent use of foot-stool (squatty-potty) for positioning with BM to decrease pain with BM and intra-abdominal pressure.    Baseline Pt. having constipation due to PFM dysfunction    Time 5    Period Weeks    Status Achieved    Target Date 04/20/19             PT Long Term Goals - 09/25/19 0001      PT LONG TERM GOAL #1   Title Pt. will be able to participate in regular ADL's with least restrictive device without pain increasing greater than 2/10    Baseline Pt. limited in her ability to perform household duties by increased pain and fatigue. As of 4/29: Her legs have more endurance with walking before she gets a tremor. Still needs to sit down the majority of the time she is cooking. Still needs back support for prolonged sitting. As of 6/10: Pt. able to do for  ~1 hour before she needs to rest to prevent increased pain, pain is averaging ~ 4-5/10    Time 10    Period Weeks    Status On-going    Target Date 10/23/19      PT LONG TERM GOAL #2   Title  Patient will score at  or below 65/300  on the PFDI and 35% on the Female NIH-CPSI to demonstrate a clinically meaningful decrease in disability and distress due to pelvic floor dysfunction.    Baseline PFDI: 110/300, Female NIH-CPSI: 29/43 (67%) As of 4/29: 100/300 on PFDI and 30/43 on NIH-CPSI    Time 10    Period Weeks    Status Unable to assess    Target Date 10/23/19      PT LONG TERM GOAL #3   Title Patient will report no pain with intercourse to demonstrate improved functional ability.    Baseline Pt. Having significant pain with intercourse. As of 4/29: is a little better (20-30%) but is worse on days where overall tension/pain is worse. As of 6/10: can have days where there is not much pain but other days where there is significant pain.    Time 10    Period Weeks    Status On-going    Target Date 10/23/19      PT LONG TERM GOAL #4   Title Pt will report ability to work in her yard for greater than 30 minutes without extreme fatigue    Baseline limited to 10-15 minutes before pt requires ending activity. As of 4/29: ~ 20 min. As of 6/10: has not been gardening much but can do ~ 30 min of similar indoor activity level before increased fatigue.    Time 10    Period Weeks    Status Achieved    Target Date 10/23/19      PT LONG TERM GOAL #5   Title Pt. will be able to go 2-3 hours between emptying her bladder without increased pain and empty her bladder fully whether by urostomy, cath, or voluntary release.    Baseline Pt. has a urostomy but continues to have to self-catheterize. Has pain with bladder filling and when using catheter. As of 4/29: is doing better since starting to take the pregabalin andd baclofen but occasionally still has pain with bladder filling. It is bad when she has an infection or over-does activity, better on "normal" days which have been few and far between due to frequent UTI's. As of 6/10: Pt. has "good" and "bad" days but has more days where she can go 2-3 hours without increased  pain    Time 10    Period Weeks    Status On-going    Target Date 10/23/19      PT LONG TERM GOAL #6   Title Patient will report having BM's at least every-other day with consistency between [facility] stool scale 3-5 over the prior week to demonstrate decreased constipation.    Baseline Pt. unable to have regula BM's without medication, manometry shows PFM dysfunction. As of 4/29: still having to use and enema or supository occasionally and still needing linzess. As of 6/10: Pt. has days where she can empty ok and days where she needs to use an Enema to stimulate a BM, feels that it is just the insertion of the enema, not the solution that does the job.    Time 10    Period Weeks    Status On-going    Target Date 10/23/19                 Plan - 10/16/19 1536    Clinical Impression Statement Christine Chavez demonstrated slow response to treatment but was able to achieve decreased spasm/TTP and improved ability to recruit obliques on the L to improve her deep-core  recruitment/strengthening exercise. She is having increased instability on her feet today and reports that it started during lunch today, when she stood up to leave the restaurant she had a hard time controlling her LLE. Her pelvic alignment appears level and it was not much improved following treatment today which leads me to concur it is more related to neurological changes. This could be up-regulated activity due to her having another UTI or early signs of an exacerbation of her condition. Christine Chavez has expressed that she believes she needs to get another brain scan and that she is 6-7 months behind schedule. I concurr that it would be helpful to know if changes are ocurring and if this aligns with a more solid diagnosis like MS or something else. Pt. has acknowldeged that she has more fatigue and weakness when temperatures are higher and only her LLE seemed to be affected with today's occurrence.    Comorbidities neurologic insult with pending  diagnosis, multiple recurrent UTI's, BLE weakness, depression    Examination-Activity Limitations Caring for Others;Carry;Continence;Squat;Stairs;Toileting;Locomotion Level;Lift;Stand    Examination-Participation Restrictions Church;Cleaning;Community Activity;Driving;Interpersonal Relationship;Laundry;Yard Work;Meal Prep    Stability/Clinical Decision Making Unstable/Unpredictable    Clinical Decision Making High    Rehab Potential Fair    PT Frequency 2x / week    PT Duration Other (comment)    PT Treatment/Interventions ADLs/Self Care Home Management;Aquatic Therapy;Biofeedback;Moist Heat;Electrical Stimulation;Cryotherapy;Traction;Ultrasound;Therapeutic activities;Functional mobility training;Stair training;Gait training;Therapeutic exercise;Balance training;Neuromuscular re-education;Patient/family education;Manual techniques;Dry needling;Scar mobilization;Energy conservation;Taping;Joint Manipulations;Spinal Manipulations;Visual/perceptual remediation/compensation;Passive range of motion    PT Next Visit Plan try TENS near coccyx, focus on graded activity, and seated and standing balance/core strength. review I's Y's and T's in prone, thoracic mobility? perform further release and PNF for R>L shoulder blades, more scar-release and cupping at suprapubic scar, MFR around B ilium. TPDN to multifidus through T-L  junction with E-stim at 40hz  and at 2 hz. continue to work on thoracolumbar rotation ROM and decreasing spasms, add SLS balance training, begin strengthening for reciprocal inhibition.    PT Home Exercise Plan A day: low back stretch, side-stretch,  hamstring stretch, butterfly stretch, hip EXT in prone with knee bent, side-lying hip ABD, calf raises.  B day: Thoracic extensions,  I's, Y's, T's, and W's, self internal TP release, child's pose stretch, DF and Eversion at the ankle. Every day: Pain diary, Posterior pelvic tilts with deep core activation and red band for obliques activation knee  fall-out, bow-and-arrow, TENS unit 3+ times per day for 30 min and as needed if pain starts to increase.    Consulted and Agree with Plan of Care Patient           Patient will benefit from skilled therapeutic intervention in order to improve the following deficits and impairments:  Abnormal gait, Decreased balance, Decreased endurance, Decreased mobility, Difficulty walking, Increased muscle spasms, Impaired sensation, Improper body mechanics, Impaired tone, Decreased activity tolerance, Decreased coordination, Decreased strength, Postural dysfunction, Pain  Visit Diagnosis: Other muscle spasm  Muscle weakness (generalized)  Difficulty in walking, not elsewhere classified     Problem List Patient Active Problem List   Diagnosis Date Noted  . Seizure (HCC) 11/11/2018  . Major depressive disorder, recurrent episode, moderate (HCC) 02/07/2018  . Nephrolithiasis 04/16/2016  . Numbness 07/28/2015  . Bladder retention 06/23/2015  . Abdominal pain 06/04/2015  . Dizziness 05/18/2015  . Neck pain 05/18/2015  . Complicated migraine 04/28/2015  . Other fatigue 04/28/2015  . Abnormal finding on MRI of brain 04/28/2015  . D (diarrhea) 03/29/2015  . H/O  disease 03/29/2015  . Abnormal weight loss 03/29/2015  . Muscle weakness (generalized) 03/14/2015  . Headache, migraine 03/10/2015   Cleophus Molt DPT, ATC Cleophus Molt 10/17/2019, 9:34 AM  [facility] MAIN [facility] SERVICES [address] [place], [place], [address] Phone: [phone]   Fax:  [phone]  Name: Christine Chavez MRN: [id number] Date of Birth: 1981-02-02

## 2019-10-21 ENCOUNTER — Other Ambulatory Visit: Payer: Self-pay

## 2019-10-21 ENCOUNTER — Ambulatory Visit: Payer: BC Managed Care – PPO

## 2019-10-21 DIAGNOSIS — M6281 Muscle weakness (generalized): Secondary | ICD-10-CM

## 2019-10-21 DIAGNOSIS — M62838 Other muscle spasm: Secondary | ICD-10-CM | POA: Diagnosis not present

## 2019-10-21 DIAGNOSIS — R262 Difficulty in walking, not elsewhere classified: Secondary | ICD-10-CM

## 2019-10-21 NOTE — Therapy (Signed)
[place] [facility] MAIN [facility] SERVICES [address] , [place], [address] Phone: [phone]   Fax:  [phone]  Physical Therapy Progress Note   Dates of reporting period  08/14/19   to   10/23/19   The patient has been informed of current processes in place at Outpatient Rehab to protect patients from Covid-19 exposure including social distancing, schedule modifications, and new cleaning procedures. After discussing their particular risk with a therapist based on the patient's personal risk factors, the patient has decided to proceed with in-person therapy.  Patient Details  Name: Christine Chavez MRN: [id number] Date of Birth: [birth year] Referring Provider (PT): [name]   Encounter Date: 10/21/2019   PT End of Session - 10/22/19 0742    Visit Number 48    Number of Visits 53    Date for PT Re-Evaluation 10/23/19    Authorization Type [employer]    Authorization Time Period from 08/14/2019 through 10/23/2019    Authorization - Visit Number 15    Authorization - Number of Visits 20    Progress Note Due on Visit 53    PT Start Time 1400    PT Stop Time 1500    PT Time Calculation (min) 60 min    Activity Tolerance Patient tolerated treatment well;No increased pain    Behavior During Therapy WFL for tasks assessed/performed           Past Medical History:  Diagnosis Date  . Complication of anesthesia    ? seizures after anesthesia   . Headache   . Migraines   . Neurogenic bladder   . Renal disorder   . Vision abnormalities     Past Surgical History:  Procedure Laterality Date  . ANTERIOR CRUCIATE LIGAMENT REPAIR  1997  . APPENDECTOMY    . COLONOSCOPY WITH PROPOFOL N/A 11/11/2018   Procedure: COLONOSCOPY WITH PROPOFOL;  Surgeon: Christena Deem, MD;  Location: [facility] ENDOSCOPY;  Service: Endoscopy;  Laterality: N/A;  . CYSTOSCOPY WITH STENT PLACEMENT Right 04/17/2016   Procedure: CYSTOSCOPY WITH STENT PLACEMENT;   Surgeon: Malen Gauze, MD;  Location: [facility] ORS;  Service: Urology;  Laterality: Right;  . ESOPHAGOGASTRODUODENOSCOPY (EGD) WITH PROPOFOL N/A 11/11/2018   Procedure: ESOPHAGOGASTRODUODENOSCOPY (EGD) WITH PROPOFOL;  Surgeon: Christena Deem, MD;  Location: [facility] ENDOSCOPY;  Service: Endoscopy;  Laterality: N/A;  . EXPLORATORY LAPAROTOMY  1999  . KIDNEY STONE SURGERY  04/2016  . REVISION UROSTOMY CUTANEOUS    . REVISION UROSTOMY CUTANEOUS  01/10/2018  . SUPRAPUBIC CATHETER PLACEMENT  08/2017  . TONSILLECTOMY      There were no vitals filed for this visit.   Pelvic Floor Physical Therapy Treatment Note  SCREENING  Changes in medications, allergies, or medical history?: none  SUBJECTIVE  Patient reports: Is being tested for hereditary spastic paraplegia (HSP) which can affect the bladder. The Neurologist does not think that there is enough to do a follow up MRI right now, to wait and see but she wants to push to have it done anyway. Had another event this morning where it felt like her LLE to turn in again and felt weaker. She did have a UTI and has been started on an antibiotic. She was not able to ask about D-mannose yet. Did take AZO on Saturday but she was feeling really sick and had to take nausea meds 2 days. Today is a little bit better but still has zero energy. Has used the TENS unit some but feels like  with the UTI it does not help as much as it does when she does not have an infection. Did not over-do it this weekend, layed around a lot.    Is still having a really hard time having a BM even with using an enema or linzess, likely worsened by the presence of a UTI.  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 Be able to take fewer medications and be able to enjoy spending time with her family again, doing "all the mom things"    OBJECTIVE  Changes in:  Observations/Posture:  L lumbar curve prominent with transverse processes more prominent on the L. (from previous note)  Range of Motion/Flexibilty:  (from 12/10) Spine: ~ 2-3 fingers from knee with SB B. ~ 50% reduced R rotation with stretch in L, ~ 75% reduced L rotation with increased tightness. Forward bend: 19 in.from floor. Hips: Unable to achieve neutral pelvis in long-sitting due to hamstring tightness.   Strength/MMT:  LE MMT (from 12-10) LE MMT Left Right  Hip flex:  (L2) /5 /5  Hip ext: 3+/5 3+/5  Hip abd: 4/5 4/5  Hip add: 4/5 3+/5  Hip IR 4/5 3+/5  Hip ER 4/5 4/5   (From 3/24): Pt. Demonstrates 3/5 strength with "I's, Y's, and T's" but 4/5 with W's. (from 6/22) DF: 9 reps with B. Eversion/Inversion: 4/5 on R, 4+/5 on L. DF: L 4+/5, R 3+/5  Seated IR/ER on L: 4/5, R 3+/5  Balance/stability:  (6-29) Pt. Able to achieve good posture without cueing but only able to maintain for ~ 2.5 min. Before seeking stability from the table and forward rolled shoulders. With cue to engage the deep core, Pt. Recognizes increased stability and comfort.  Today:   Pelvic Floor External Exam: (From prior session)- [Introitus Appears: elevated Skin integrity: WNL Palpation: TTP to B STP Cough: limited motion Prolapse visible?: no Scar mobility: not assessed  Internal Vaginal Exam: Strength (PERF): Pt. Has difficulty sensing the PFM to generate contraction or relaxation due to high tone/neuro involvement Symmetry: L>R for spasm/TTP Palpation: TTP to all muscles throughout B Prolapse: none]   Abdominal:  Pt. Demonstrated ability to recruit R>L obliques and TA with pelvic  tilts.  ** mild improvement in recruitment following TP release   Palpation: TTP to L intercostals and serratus anterior and paraspinals near T-L junction.  Gait Analysis:  INTERVENTIONS THIS SESSION:  Self-care: Discussed the working diagnosis given to the Pt. By her neurologist, reviewed goals and made a plan for moving forward. Discussed that we will adjust frequency/plan if we do not see the changes we expect to see with medical interventions for spasms and UTI's.  Manual: Performed fascial release to B lower quadrants to decrease tension on nerves and help decrease spasms of the bladder and PFM to decrease pain and decrease likelihood of UTI.  Total time: 60 min.                             PT Short Term Goals - 10/21/19 1408      PT  SHORT TERM GOAL #1   Title Patient will demonstrate coordinated diaphragmatic breathing with pelvic tilts to demonstrate improved control of diaphragm and TA, to allow for further strengthening of core musculature and decreased pelvic floor spasm.    Baseline Pt. demonstrates breathing dysfunction and poor PFM coordination evidenced by anal manometry As of 2/22: Pt. continues to have restriction in her diaphragm and near T/L junction but is able to intentionally use diaphragmatic breathing through the available ROM.    Time 5    Period Weeks    Status Achieved    Target Date 04/20/19      PT SHORT TERM GOAL #2   Title Patient will report a reduction in pain to no greater than 6/10 over the prior week to demonstrate symptom improvement.    Baseline Pain is 10/10 at worst, 2/10 at best As of 2/22: Pt. had achieved this goal for 1 week as of 1/21 but may have infection or other insult that caused pain to increase again. As of 4/29: Pain high of 8/10 but has been over-doing her activity over the past week. As of 6/10: Pt. was able to keep pain at 6 or below since prior visit by using TENS to help keep tension lower but has not had  a full week to test if it will keep working. As of 7/6: Pt. had met this goal but has a new UTI and it caused pain to spike to 8/10 over the weekend.    Time 5    Period Weeks    Status On-going    Target Date 10/23/19      PT SHORT TERM GOAL #3   Title Patient will demonstrate HEP x1 in the clinic to demonstrate understanding and proper form to allow for further improvement.    Baseline Pt. lacks knowledge of therepeutic exercises that can decrease her pain/Sx.    Time 5    Period Weeks    Status Achieved    Target Date 04/20/19      PT SHORT TERM GOAL #4   Title Patient will report consistent use of foot-stool (squatty-potty) for positioning with BM to decrease pain with BM and intra-abdominal pressure.    Baseline Pt. having constipation due to PFM dysfunction    Time 5    Period Weeks    Status Achieved    Target Date 04/20/19             PT Long Term Goals - 10/21/19 1424      PT LONG TERM GOAL #1   Title Pt. will be able to participate in regular ADL's with least restrictive device without pain increasing greater than 2/10    Baseline Pt. limited in her ability to perform household duties by increased pain and fatigue. As of 4/29: Her legs have more endurance with walking before she gets a tremor. Still needs to sit down the majority of the time she is cooking. Still needs back support for prolonged sitting. As of 6/10: Pt. able to do for  ~1 hour before she needs to rest to prevent increased pain, pain is averaging ~ 4-5/10    Time 10    Period Weeks    Status On-going    Target Date 12/30/19      PT LONG TERM GOAL #2   Title Patient will score at or below 65/300  on the PFDI and 35% on the Female NIH-CPSI to demonstrate a clinically meaningful decrease in disability and distress due to pelvic  floor dysfunction.    Baseline PFDI: 110/300, Female NIH-CPSI: 29/43 (67%) As of 4/29: 100/300 on PFDI and 30/43 on NIH-CPSI As of 7/6:  NIH-CPSI: 32/43 POPDI: 61/100, CRADI-8:  29/100, UDI: 44/100 (PFDI: 149/300)    Time 10    Period Weeks    Status On-going    Target Date 12/30/19      PT LONG TERM GOAL #3   Title Patient will report no pain with intercourse to demonstrate improved functional ability.    Baseline Pt. Having significant pain with intercourse. As of 4/29: is a little better (20-30%) but is worse on days where overall tension/pain is worse. As of 6/10: can have days where there is not much pain but other days where there is significant pain.    Time 10    Period Weeks    Status On-going      PT LONG TERM GOAL #4   Title Pt will report ability to work in her yard for greater than 30 minutes without extreme fatigue    Baseline limited to 10-15 minutes before pt requires ending activity. As of 4/29: ~ 20 min. As of 6/10: has not been gardening much but can do ~ 30 min of similar indoor activity level before increased fatigue.    Time 10    Period Weeks    Status Achieved    Target Date 10/23/19      PT LONG TERM GOAL #5   Title Pt. will be able to go 2-3 hours between emptying her bladder without increased pain and empty her bladder fully whether by urostomy, cath, or voluntary release.    Baseline Pt. has a urostomy but continues to have to self-catheterize. Has pain with bladder filling and when using catheter. As of 4/29: is doing better since starting to take the pregabalin andd baclofen but occasionally still has pain with bladder filling. It is bad when she has an infection or over-does activity, better on "normal" days which have been few and far between due to frequent UTI's. As of 6/10: Pt. has "good" and "bad" days but has more days where she can go 2-3 hours without increased pain    Time 10    Period Weeks    Status On-going    Target Date 12/30/19      PT LONG TERM GOAL #6   Title Patient will report having BM's at least every-other day with consistency between [facility] stool scale 3-5 over the prior week to demonstrate decreased  constipation.    Baseline Pt. unable to have regula BM's without medication, manometry shows PFM dysfunction. As of 4/29: still having to use and enema or supository occasionally and still needing linzess. As of 6/10: Pt. has days where she can empty ok and days where she needs to use an Enema to stimulate a BM, feels that it is just the insertion of the enema, not the solution that does the job.    Time 10    Period Weeks    Status On-going    Target Date 12/30/19                 Plan - 10/22/19 0743    Clinical Impression Statement Pt. continues to make slow progress when she is in-between UTI flare-ups but the frequency of her UTI's has made progress very difficult. She remains as compliant with her HEP as possible with her energy levels taking a big hit and her spasms increasing every time she has a UTI.  Pt. has been advised to talk to her MD's about potential safety for use of D-mannose or [place] to help combat against the chronic UTI's and allow for faster progress. She is also going to be having a nerve block July 20th that we hope will decrease bladder spasticity and allow for progress wit PT. While the Pt. is not making progress as fast as we would like to see, The Pt. and myself believe that without PT she would be regressing and that PT continues to be therapeutic in allowing her to have the least pain and best function possible while she continues to search for answers about the cause of her disease and the best treatment options for her underlying neurological condition. We will continue at 2x/wk for another 10 weeks which will allow [place] to see what changes can be made following the nerve block and hopefully she will get some relief from the frequent UTI's.    Personal Factors and Comorbidities Comorbidity 3+    Comorbidities neurologic insult with pending diagnosis, multiple recurrent UTI's, BLE weakness, depression    Examination-Activity Limitations Caring for  Others;Carry;Continence;Squat;Stairs;Toileting;Locomotion Level;Lift;Stand    Examination-Participation Restrictions Church;Cleaning;Community Activity;Driving;Interpersonal Relationship;Laundry;Yard Work;Meal Prep    Stability/Clinical Decision Making Unstable/Unpredictable    Clinical Decision Making High    Rehab Potential Fair    PT Frequency 2x / week    PT Duration Other (comment)    PT Treatment/Interventions ADLs/Self Care Home Management;Aquatic Therapy;Biofeedback;Moist Heat;Electrical Stimulation;Cryotherapy;Traction;Ultrasound;Therapeutic activities;Functional mobility training;Stair training;Gait training;Therapeutic exercise;Balance training;Neuromuscular re-education;Patient/family education;Manual techniques;Dry needling;Scar mobilization;Energy conservation;Taping;Joint Manipulations;Spinal Manipulations;Visual/perceptual remediation/compensation;Passive range of motion    PT Next Visit Plan try TENS near coccyx, focus on graded activity, and seated and standing balance/core strength. review I's Y's and T's in prone, thoracic mobility? perform further release and PNF for R>L shoulder blades, more scar-release and cupping at suprapubic scar, MFR around B ilium. TPDN to multifidus through T-L  junction with E-stim at '40hz'$  and at 2 hz. continue to work on thoracolumbar rotation ROM and decreasing spasms, add SLS balance training, begin strengthening for reciprocal inhibition.    PT Home Exercise Plan A day: low back stretch, side-stretch,  hamstring stretch, butterfly stretch, hip EXT in prone with knee bent, side-lying hip ABD, calf raises.  B day: Thoracic extensions,  I's, Y's, T's, and W's, self internal TP release, child's pose stretch, DF and Eversion at the ankle. Every day: Pain diary, Posterior pelvic tilts with deep core activation and red band for obliques activation knee fall-out, bow-and-arrow, TENS unit 3+ times per day for 30 min and as needed if pain starts to increase.     Consulted and Agree with Plan of Care Patient           Patient will benefit from skilled therapeutic intervention in order to improve the following deficits and impairments:  Abnormal gait, Decreased balance, Decreased endurance, Decreased mobility, Difficulty walking, Increased muscle spasms, Impaired sensation, Improper body mechanics, Impaired tone, Decreased activity tolerance, Decreased coordination, Decreased strength, Postural dysfunction, Pain  Visit Diagnosis: Other muscle spasm  Muscle weakness (generalized)  Difficulty in walking, not elsewhere classified     Problem List Patient Active Problem List   Diagnosis Date Noted  . Seizure ([place]) 11/11/2018  . Major depressive disorder, recurrent episode, moderate ([place]) 02/07/2018  . Nephrolithiasis 04/16/2016  . Numbness 07/28/2015  . Bladder retention 06/23/2015  . Abdominal pain 06/04/2015  . Dizziness 05/18/2015  . Neck pain 05/18/2015  . Complicated migraine 58/85/0277  . Other fatigue 04/28/2015  . Abnormal  finding on MRI of brain 04/28/2015  . D (diarrhea) 03/29/2015  . H/O disease 03/29/2015  . Abnormal weight loss 03/29/2015  . Muscle weakness (generalized) 03/14/2015  . Headache, migraine 03/10/2015   Willa Rough DPT, ATC Willa Rough 10/22/2019, 7:53 AM  [place] MAIN [facility] SERVICES [address] [place], [place], [address] Phone: [phone]   Fax:  [phone]  Name: Carmel Garfield MRN: [id number] Date of Birth: March 31, 1981

## 2019-10-23 ENCOUNTER — Ambulatory Visit: Payer: BC Managed Care – PPO

## 2019-10-23 ENCOUNTER — Other Ambulatory Visit: Payer: Self-pay

## 2019-10-23 DIAGNOSIS — M6281 Muscle weakness (generalized): Secondary | ICD-10-CM

## 2019-10-23 DIAGNOSIS — M62838 Other muscle spasm: Secondary | ICD-10-CM

## 2019-10-23 DIAGNOSIS — R262 Difficulty in walking, not elsewhere classified: Secondary | ICD-10-CM

## 2019-10-23 NOTE — Therapy (Signed)
[place] MAIN [facility] SERVICES [address] [place], [place], [address] Phone: [phone]   Fax:  [phone]  Physical Therapy Treatment  The patient has been informed of current processes in place at Outpatient Rehab to protect patients from Covid-19 exposure including social distancing, schedule modifications, and new cleaning procedures. After discussing their particular risk with a therapist based on the patient's personal risk factors, the patient has decided to proceed with in-person therapy.   Patient Details  Name: Christine Chavez MRN: [id number] Date of Birth: 1980-08-09 Referring Provider (PT): [name]   Encounter Date: 10/23/2019   PT End of Session - 10/23/19 1539    Visit Number 53    Number of Visits 58    Date for PT Re-Evaluation 12/30/19    Authorization Type [employer]    Authorization Time Period from 10/22/2019 through 12/30/2019    Authorization - Visit Number 1    Authorization - Number of Visits 20    Progress Note Due on Visit 66    PT Start Time 1430    PT Stop Time 1530    PT Time Calculation (min) 60 min    Activity Tolerance Patient tolerated treatment well;No increased pain    Behavior During Therapy WFL for tasks assessed/performed           Past Medical History:  Diagnosis Date  . Complication of anesthesia    ? seizures after anesthesia   . Headache   . Migraines   . Neurogenic bladder   . Renal disorder   . Vision abnormalities     Past Surgical History:  Procedure Laterality Date  . ANTERIOR CRUCIATE LIGAMENT REPAIR  1997  . APPENDECTOMY    . COLONOSCOPY WITH PROPOFOL N/A 11/11/2018   Procedure: COLONOSCOPY WITH PROPOFOL;  Surgeon: [name], MD;  Location: [facility] ENDOSCOPY;  Service: Endoscopy;  Laterality: N/A;  . CYSTOSCOPY WITH STENT PLACEMENT Right 04/17/2016   Procedure: CYSTOSCOPY WITH STENT PLACEMENT;  Surgeon: [name], MD;  Location: [facility] ORS;  Service:  Urology;  Laterality: Right;  . ESOPHAGOGASTRODUODENOSCOPY (EGD) WITH PROPOFOL N/A 11/11/2018   Procedure: ESOPHAGOGASTRODUODENOSCOPY (EGD) WITH PROPOFOL;  Surgeon: [name], MD;  Location: [facility] ENDOSCOPY;  Service: Endoscopy;  Laterality: N/A;  . EXPLORATORY LAPAROTOMY  1999  . KIDNEY STONE SURGERY  04/2016  . REVISION UROSTOMY CUTANEOUS    . REVISION UROSTOMY CUTANEOUS  01/10/2018  . SUPRAPUBIC CATHETER PLACEMENT  08/2017  . TONSILLECTOMY      There were no vitals filed for this visit.  Pelvic Floor Physical Therapy Treatment Note  SCREENING  Changes in medications, allergies, or medical history?: none  SUBJECTIVE  Patient reports: She is still taking the antibiotic, is still trying to rebound and bounce back from UTI. Has been getting big waves of nausea/sick feeling but then it passes. Has also felt bloated/GI issues. Kept the TENS unit on most of the afternoon yesterday and feels like it helped keep the pain down some. Has a burning sensation in the L lateral leg that started when she started having more trouble with that foot wanting to turn in.   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  Patient Goals: Get rid of the discomfort with catheterizing and ache/bladder pain. Be able to take fewer medications and be able to enjoy spending time with her family again, doing "all the mom things"    OBJECTIVE  Changes in:  Observations/Posture:  L lumbar curve prominent with transverse processes more prominent on the L. (from previous note)  Range of Motion/Flexibilty:  (from 12/10) Spine:  ~ 2-3 fingers from knee with SB B. ~ 50% reduced R rotation with stretch in L, ~ 75% reduced L rotation with increased tightness. Forward bend: 19 in.from floor.  Hips: Unable to achieve neutral pelvis in long-sitting due to hamstring tightness.  **following treatment Pt. Able to achieve 90 deg. Hip flexion in seated HS stretch with greater restriction on L>R.  Strength/MMT:  LE MMT (from 12-10) LE MMT Left Right  Hip flex:  (L2) /5 /5  Hip ext: 3+/5 3+/5  Hip abd: 4/5 4/5  Hip add: 4/5 3+/5  Hip IR 4/5 3+/5  Hip ER 4/5 4/5   (From 3/24): Pt. Demonstrates 3/5 strength with "I's, Y's, and T's" but 4/5 with W's. (from 6/22) DF: 9 reps with B. Eversion/Inversion: 4/5 on R, 4+/5 on L. DF: L 4+/5, R 3+/5  Seated IR/ER on L: 4/5, R 3+/5  Balance/stability:  (6-29) Pt. Able to achieve good posture without cueing but only able to maintain for ~ 2.5 min. Before seeking stability from the table and forward rolled shoulders. With cue to engage the deep core, Pt. Recognizes increased stability and comfort.  Today: Pt. Demonstrating decreased fear of movement/unsteadiness compared to prior two sessions, likely due to antibiotic starting to work.  Pelvic Floor External Exam: (From prior session)- [Introitus Appears: elevated Skin integrity: WNL Palpation: TTP to B STP Cough: limited motion Prolapse visible?: no Scar mobility: not assessed  Internal Vaginal Exam: Strength (PERF): Pt. Has difficulty sensing the PFM to generate contraction or relaxation due to high tone/neuro involvement Symmetry: L>R for spasm/TTP Palpation: TTP to all muscles throughout B Prolapse: none]   Abdominal:  Pt. Demonstrated ability to recruit R>L obliques and TA with pelvic tilts. (from prior session)  Palpation: TTP to B medial and lateral hamstrings  Gait Analysis:  INTERVENTIONS THIS SESSION:  Therex: Educated on and practiced seated HS stretch and neural glides for the LLE to decrease  burning/weakness in LLE.  Manual: Performed fascial release and TP release to B hamstrings to decrease tension on nerves and help decrease spasms of the bladder and PFM to decrease pain and decrease likelihood of UTI.  Dry-needle: Performed TPDN with a .30x16m needle and standard approach as described below to decrease spasm and pain and allow for improved balance of musculature for improved function and decreased symptoms.  Total time: 60 min.                      Trigger Point Dry Needling - 10/24/19 0001    Consent Given? Yes    Education Handout Provided No    Muscles Treated Lower Quadrant Hamstring    Dry Needling Comments Bilateral, medial and lateral mid-thigh    Hamstring Response Twitch response elicited;Palpable increased muscle length                  PT Short Term Goals - 10/21/19 1408      PT SHORT TERM GOAL #1   Title Patient will demonstrate coordinated diaphragmatic breathing with pelvic tilts to demonstrate  improved control of diaphragm and TA, to allow for further strengthening of core musculature and decreased pelvic floor spasm.    Baseline Pt. demonstrates breathing dysfunction and poor PFM coordination evidenced by anal manometry As of 2/22: Pt. continues to have restriction in her diaphragm and near T/L junction but is able to intentionally use diaphragmatic breathing through the available ROM.    Time 5    Period Weeks    Status Achieved    Target Date 04/20/19      PT SHORT TERM GOAL #2   Title Patient will report a reduction in pain to no greater than 6/10 over the prior week to demonstrate symptom improvement.    Baseline Pain is 10/10 at worst, 2/10 at best As of 2/22: Pt. had achieved this goal for 1 week as of 1/21 but may have infection or other insult that caused pain to increase again. As of 4/29: Pain high of 8/10 but has been over-doing her activity over the past week. As of 6/10: Pt. was able to keep pain at 6 or below  since prior visit by using TENS to help keep tension lower but has not had a full week to test if it will keep working. As of 7/6: Pt. had met this goal but has a new UTI and it caused pain to spike to 8/10 over the weekend.    Time 5    Period Weeks    Status On-going    Target Date 10/23/19      PT SHORT TERM GOAL #3   Title Patient will demonstrate HEP x1 in the clinic to demonstrate understanding and proper form to allow for further improvement.    Baseline Pt. lacks knowledge of therepeutic exercises that can decrease her pain/Sx.    Time 5    Period Weeks    Status Achieved    Target Date 04/20/19      PT SHORT TERM GOAL #4   Title Patient will report consistent use of foot-stool (squatty-potty) for positioning with BM to decrease pain with BM and intra-abdominal pressure.    Baseline Pt. having constipation due to PFM dysfunction    Time 5    Period Weeks    Status Achieved    Target Date 04/20/19             PT Long Term Goals - 10/21/19 1424      PT LONG TERM GOAL #1   Title Pt. will be able to participate in regular ADL's with least restrictive device without pain increasing greater than 2/10    Baseline Pt. limited in her ability to perform household duties by increased pain and fatigue. As of 4/29: Her legs have more endurance with walking before she gets a tremor. Still needs to sit down the majority of the time she is cooking. Still needs back support for prolonged sitting. As of 6/10: Pt. able to do for  ~1 hour before she needs to rest to prevent increased pain, pain is averaging ~ 4-5/10    Time 10    Period Weeks    Status On-going    Target Date 12/30/19      PT LONG TERM GOAL #2   Title Patient will score at or below 65/300  on the PFDI and 35% on the Female NIH-CPSI to demonstrate a clinically meaningful decrease in disability and distress due to pelvic floor dysfunction.    Baseline PFDI: 110/300, Female NIH-CPSI: 29/43 (67%) As of 4/29: 100/300 on PFDI  and 30/43 on NIH-CPSI As of 7/6:  NIH-CPSI: 32/43 POPDI: 61/100, CRADI-8: 29/100, UDI: 44/100 (PFDI: 149/300)    Time 10    Period Weeks    Status On-going    Target Date 12/30/19      PT LONG TERM GOAL #3   Title Patient will report no pain with intercourse to demonstrate improved functional ability.    Baseline Pt. Having significant pain with intercourse. As of 4/29: is a little better (20-30%) but is worse on days where overall tension/pain is worse. As of 6/10: can have days where there is not much pain but other days where there is significant pain.    Time 10    Period Weeks    Status On-going      PT LONG TERM GOAL #4   Title Pt will report ability to work in her yard for greater than 30 minutes without extreme fatigue    Baseline limited to 10-15 minutes before pt requires ending activity. As of 4/29: ~ 20 min. As of 6/10: has not been gardening much but can do ~ 30 min of similar indoor activity level before increased fatigue.    Time 10    Period Weeks    Status Achieved    Target Date 10/23/19      PT LONG TERM GOAL #5   Title Pt. will be able to go 2-3 hours between emptying her bladder without increased pain and empty her bladder fully whether by urostomy, cath, or voluntary release.    Baseline Pt. has a urostomy but continues to have to self-catheterize. Has pain with bladder filling and when using catheter. As of 4/29: is doing better since starting to take the pregabalin andd baclofen but occasionally still has pain with bladder filling. It is bad when she has an infection or over-does activity, better on "normal" days which have been few and far between due to frequent UTI's. As of 6/10: Pt. has "good" and "bad" days but has more days where she can go 2-3 hours without increased pain    Time 10    Period Weeks    Status On-going    Target Date 12/30/19      PT LONG TERM GOAL #6   Title Patient will report having BM's at least every-other day with consistency between  [facility] stool scale 3-5 over the prior week to demonstrate decreased constipation.    Baseline Pt. unable to have regula BM's without medication, manometry shows PFM dysfunction. As of 4/29: still having to use and enema or supository occasionally and still needing linzess. As of 6/10: Pt. has days where she can empty ok and days where she needs to use an Enema to stimulate a BM, feels that it is just the insertion of the enema, not the solution that does the job.    Time 10    Period Weeks    Status On-going    Target Date 12/30/19                 Plan - 10/23/19 1541    Clinical Impression Statement Pt. Responded well to all interventions today, demonstrating improved hip flexion ROM, decreased spasm and burning/pain in LLE, as well as understanding and correct performance of all education and exercises provided today. They will continue to benefit from skilled physical therapy to work toward remaining goals and maximize function as well as decrease likelihood of symptom increase or recurrence.    PT Next Visit Plan ask about neural  glides on LLE, re-assess fro TTP, try TENS near coccyx, focus on graded activity, and seated and standing balance/core strength. review I's Y's and T's in prone, thoracic mobility? perform further release and PNF for R>L shoulder blades, more scar-release and cupping at suprapubic scar, MFR around B ilium. TPDN to multifidus through T-L  junction with E-stim at _0  and at 2 hz. continue to work on thoracolumbar rotation ROM and decreasing spasms, add SLS balance training, begin strengthening for reciprocal inhibition.    PT Home Exercise Plan A day: low back stretch, side-stretch,  hamstring stretch, butterfly stretch, hip EXT in prone with knee bent, side-lying hip ABD, calf raises.  B day: Thoracic extensions,  I's, Y's, T's, and W's, self internal TP release, child's pose stretch, DF and Eversion at the ankle. Every day: Pain diary, Posterior pelvic tilts with  deep core activation and red band for obliques activation knee fall-out, bow-and-arrow, TENS unit 3+ times per day for 30 min and as needed if pain starts to increase.    Consulted and Agree with Plan of Care Patient           Patient will benefit from skilled therapeutic intervention in order to improve the following deficits and impairments:     Visit Diagnosis: Other muscle spasm  Muscle weakness (generalized)  Difficulty in walking, not elsewhere classified     Problem List Patient Active Problem List   Diagnosis Date Noted  . Seizure ([place]) 11/11/2018  . Major depressive disorder, recurrent episode, moderate ([place]) 02/07/2018  . Nephrolithiasis 04/16/2016  . Numbness 07/28/2015  . Bladder retention 06/23/2015  . Abdominal pain 06/04/2015  . Dizziness 05/18/2015  . Neck pain 05/18/2015  . Complicated migraine 97/96/4189  . Other fatigue 04/28/2015  . Abnormal finding on MRI of brain 04/28/2015  . D (diarrhea) 03/29/2015  . H/O disease 03/29/2015  . Abnormal weight loss 03/29/2015  . Muscle weakness (generalized) 03/14/2015  . Headache, migraine 03/10/2015   Willa Rough DPT, ATC Willa Rough 10/24/2019, 9:53 AM  [place] MAIN [facility] SERVICES [address] [place], [place], [address] Phone: [phone]   Fax:  [phone]  Name: Henli Hey MRN: [id number] Date of Birth: 23-Nov-1980

## 2019-10-23 NOTE — Patient Instructions (Signed)
Seated -in chair hamstring stretch    Perform 2 sets of 10 belly breaths to both sides. Repeat 1-2/day   Action: Stick on leg out, with toes pointing up towards your nose and knee straight. With abdominals engaged, sitting up right, gently lean forward till you feel a gentle stretch at the back of your leg.     Laying Down- Hamstring stretch   Perform 2 sets of 10 belly breaths to both sides. Repeat 1-2/ day   Action: While laying down on a flat surface, bend one knee. Take a sheet, strap, or belt and lift the other leg straight (toes towards the nose) pulling the leg up until you feel a gentle stretch in the back of your leg/ low glutes.   **DO 30 "neural glides" like you are pumping the clutch on and off with the L foot only.

## 2019-10-28 ENCOUNTER — Ambulatory Visit: Payer: BC Managed Care – PPO

## 2019-10-30 ENCOUNTER — Ambulatory Visit: Payer: BC Managed Care – PPO

## 2019-10-30 ENCOUNTER — Other Ambulatory Visit: Payer: Self-pay

## 2019-10-30 DIAGNOSIS — M62838 Other muscle spasm: Secondary | ICD-10-CM | POA: Diagnosis not present

## 2019-10-30 DIAGNOSIS — R262 Difficulty in walking, not elsewhere classified: Secondary | ICD-10-CM

## 2019-10-30 DIAGNOSIS — M6281 Muscle weakness (generalized): Secondary | ICD-10-CM

## 2019-10-30 NOTE — Therapy (Signed)
[place] MAIN [facility] SERVICES [address] [place], [place], [address] Phone: [phone]   Fax:  [phone]  Physical Therapy Treatment  The patient has been informed of current processes in place at Outpatient Rehab to protect patients from Covid-19 exposure including social distancing, schedule modifications, and new cleaning procedures. After discussing their particular risk with a therapist based on the patient's personal risk factors, the patient has decided to proceed with in-person therapy.   Patient Details  Name: Christine Chavez MRN: [id number] Date of Birth: 12/25/1980 Referring Provider (PT): [name]   Encounter Date: 10/30/2019   PT End of Session - 10/31/19 0928    Visit Number 50    Number of Visits 4    Date for PT Re-Evaluation 12/30/19    Authorization Type [employer]    Authorization Time Period from 10/22/2019 through 12/30/2019    Authorization - Visit Number 2    Authorization - Number of Visits 20    Progress Note Due on Visit 23    PT Start Time 1432    PT Stop Time 1532    PT Time Calculation (min) 60 min    Activity Tolerance Patient tolerated treatment well;No increased pain    Behavior During Therapy WFL for tasks assessed/performed           Past Medical History:  Diagnosis Date  . Complication of anesthesia    ? seizures after anesthesia   . Headache   . Migraines   . Neurogenic bladder   . Renal disorder   . Vision abnormalities     Past Surgical History:  Procedure Laterality Date  . ANTERIOR CRUCIATE LIGAMENT REPAIR  1997  . APPENDECTOMY    . COLONOSCOPY WITH PROPOFOL N/A 11/11/2018   Procedure: COLONOSCOPY WITH PROPOFOL;  Surgeon: [name], MD;  Location: [facility] ENDOSCOPY;  Service: Endoscopy;  Laterality: N/A;  . CYSTOSCOPY WITH STENT PLACEMENT Right 04/17/2016   Procedure: CYSTOSCOPY WITH STENT PLACEMENT;  Surgeon: [name], MD;  Location: [facility] ORS;  Service:  Urology;  Laterality: Right;  . ESOPHAGOGASTRODUODENOSCOPY (EGD) WITH PROPOFOL N/A 11/11/2018   Procedure: ESOPHAGOGASTRODUODENOSCOPY (EGD) WITH PROPOFOL;  Surgeon: [name], MD;  Location: [facility] ENDOSCOPY;  Service: Endoscopy;  Laterality: N/A;  . EXPLORATORY LAPAROTOMY  1999  . KIDNEY STONE SURGERY  04/2016  . REVISION UROSTOMY CUTANEOUS    . REVISION UROSTOMY CUTANEOUS  01/10/2018  . SUPRAPUBIC CATHETER PLACEMENT  08/2017  . TONSILLECTOMY      There were no vitals filed for this visit.   Pelvic Floor Physical Therapy Treatment Note  SCREENING  Changes in medications, allergies, or medical history?: none  SUBJECTIVE  Patient reports: Feeling "completely zapped" the last few days and had  Bad nausea as soon as she woke up. Also had really bad itching. Having some pain when emptying her bladder still. Not sure if she was on the antibiotic long enough. Was able to see a new counselor starting Monday and she really likes her. Is having to take Zofran. Hypogastric nerve block is Tuesday 7/20. She is still having issues with being able to focus her vision. Doing pelvic tilts, hip ABD and calf raises a lot, has been doing exercises to keep upper body "limber".   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  Patient Goals: Get rid of the discomfort with catheterizing and ache/bladder pain. Be able to take fewer medications and be able to enjoy spending time with her family again, doing "all the mom things"    OBJECTIVE  Changes in:  Observations/Posture:  L lumbar curve prominent with transverse  processes more prominent on the L. (from previous note)  Range of Motion/Flexibilty:  (from 12/10) Spine: ~ 2-3 fingers from knee with SB B. ~ 50% reduced R rotation with stretch in L, ~ 75% reduced L rotation with increased tightness. HS: able to achieve 90 deg. Hip flexion in seated HS stretch. (from prior treatment)  Strength/MMT:  LE MMT (from 12-10) LE MMT Left Right  Hip flex:  (L2) /5 /5  Hip ext: 3+/5 3+/5  Hip abd: 4/5 4/5  Hip add: 4/5 3+/5  Hip IR 4/5 3+/5  Hip ER 4/5 4/5   (From 3/24): Pt. Demonstrates 3/5 strength with "I's, Y's, and T's" but 4/5 with W's. (from 6/22) DF: 9 reps with B. Eversion/Inversion: 4/5 on R, 4+/5 on L. DF: L 4+/5, R 3+/5  Seated IR/ER on L: 4/5, R 3+/5  Balance/stability:  (6-29) Pt. Able to achieve good posture without cueing but only able to maintain for ~ 2.5 min. Before seeking stability from the table and forward rolled shoulders. With cue to engage the deep core, Pt. Recognizes increased stability and comfort.   Pelvic Floor External Exam: (From prior session)- [Introitus Appears: elevated Skin integrity: WNL Palpation: TTP to B STP Cough: limited motion Prolapse visible?: no Scar mobility: not assessed  Internal Vaginal Exam: Strength (PERF): Pt. Has difficulty sensing the PFM to generate contraction or relaxation due to high tone/neuro involvement Symmetry: L>R for spasm/TTP Palpation: TTP to all muscles throughout B Prolapse: none]   Abdominal:  Pt. Demonstrated ability to recruit R>L obliques and TA with pelvic tilts. (from prior session)  Palpation: TTP to L QL, Piriformis, Erector spinae, multifidus, and Glute Med.  Gait Analysis:  INTERVENTIONS THIS SESSION:  Manual: Performed fascial release and TP release to L QL, Glute med. And Lumbar multifidus at ~ T12-L1 to decrease tension on nerves and help decrease spasms of the bladder and PFM to decrease pain and decrease likelihood of UTI.  Dry-needle: Performed TPDN  with 2 .30x30mm needles and 2 .30x53mm needles and standard approach as well as E-stim as described below to decrease spasm and pain and allow for improved balance of musculature for improved function and decreased symptoms.  E-stim: Performed E-stim at 43mA frequency for 10 min. At L QL to [facility] Med. And L ~ T12-L1 multifidus at 80 mA for 10 min. To decrease spasm and pain in L hip and allow for decreased pressure on lumbosacral nerve roots.   Total time: 60 min.                              PT Short Term Goals - 10/21/19 1408      PT SHORT TERM GOAL #1   Title Patient will demonstrate coordinated diaphragmatic breathing with pelvic tilts to demonstrate improved control of diaphragm and TA, to allow for further strengthening of core musculature and decreased pelvic floor spasm.    Baseline Pt. demonstrates breathing dysfunction and poor PFM coordination evidenced by anal manometry As  of 2/22: Pt. continues to have restriction in her diaphragm and near T/L junction but is able to intentionally use diaphragmatic breathing through the available ROM.    Time 5    Period Weeks    Status Achieved    Target Date 04/20/19      PT SHORT TERM GOAL #2   Title Patient will report a reduction in pain to no greater than 6/10 over the prior week to demonstrate symptom improvement.    Baseline Pain is 10/10 at worst, 2/10 at best As of 2/22: Pt. had achieved this goal for 1 week as of 1/21 but may have infection or other insult that caused pain to increase again. As of 4/29: Pain high of 8/10 but has been over-doing her activity over the past week. As of 6/10: Pt. was able to keep pain at 6 or below since prior visit by using TENS to help keep tension lower but has not had a full week to test if it will keep working. As of 7/6: Pt. had met this goal but has a new UTI and it caused pain to spike to 8/10 over the weekend.    Time 5    Period Weeks    Status On-going    Target Date  10/23/19      PT SHORT TERM GOAL #3   Title Patient will demonstrate HEP x1 in the clinic to demonstrate understanding and proper form to allow for further improvement.    Baseline Pt. lacks knowledge of therepeutic exercises that can decrease her pain/Sx.    Time 5    Period Weeks    Status Achieved    Target Date 04/20/19      PT SHORT TERM GOAL #4   Title Patient will report consistent use of foot-stool (squatty-potty) for positioning with BM to decrease pain with BM and intra-abdominal pressure.    Baseline Pt. having constipation due to PFM dysfunction    Time 5    Period Weeks    Status Achieved    Target Date 04/20/19             PT Long Term Goals - 10/21/19 1424      PT LONG TERM GOAL #1   Title Pt. will be able to participate in regular ADL's with least restrictive device without pain increasing greater than 2/10    Baseline Pt. limited in her ability to perform household duties by increased pain and fatigue. As of 4/29: Her legs have more endurance with walking before she gets a tremor. Still needs to sit down the majority of the time she is cooking. Still needs back support for prolonged sitting. As of 6/10: Pt. able to do for  ~1 hour before she needs to rest to prevent increased pain, pain is averaging ~ 4-5/10    Time 10    Period Weeks    Status On-going    Target Date 12/30/19      PT LONG TERM GOAL #2   Title Patient will score at or below 65/300  on the PFDI and 35% on the Female NIH-CPSI to demonstrate a clinically meaningful decrease in disability and distress due to pelvic floor dysfunction.    Baseline PFDI: 110/300, Female NIH-CPSI: 29/43 (67%) As of 4/29: 100/300 on PFDI and 30/43 on NIH-CPSI As of 7/6:  NIH-CPSI: 32/43 POPDI: 61/100, CRADI-8: 29/100, UDI: 44/100 (PFDI: 149/300)    Time 10    Period Weeks    Status On-going  Target Date 12/30/19      PT LONG TERM GOAL #3   Title Patient will report no pain with intercourse to demonstrate improved  functional ability.    Baseline Pt. Having significant pain with intercourse. As of 4/29: is a little better (20-30%) but is worse on days where overall tension/pain is worse. As of 6/10: can have days where there is not much pain but other days where there is significant pain.    Time 10    Period Weeks    Status On-going      PT LONG TERM GOAL #4   Title Pt will report ability to work in her yard for greater than 30 minutes without extreme fatigue    Baseline limited to 10-15 minutes before pt requires ending activity. As of 4/29: ~ 20 min. As of 6/10: has not been gardening much but can do ~ 30 min of similar indoor activity level before increased fatigue.    Time 10    Period Weeks    Status Achieved    Target Date 10/23/19      PT LONG TERM GOAL #5   Title Pt. will be able to go 2-3 hours between emptying her bladder without increased pain and empty her bladder fully whether by urostomy, cath, or voluntary release.    Baseline Pt. has a urostomy but continues to have to self-catheterize. Has pain with bladder filling and when using catheter. As of 4/29: is doing better since starting to take the pregabalin andd baclofen but occasionally still has pain with bladder filling. It is bad when she has an infection or over-does activity, better on "normal" days which have been few and far between due to frequent UTI's. As of 6/10: Pt. has "good" and "bad" days but has more days where she can go 2-3 hours without increased pain    Time 10    Period Weeks    Status On-going    Target Date 12/30/19      PT LONG TERM GOAL #6   Title Patient will report having BM's at least every-other day with consistency between [facility] stool scale 3-5 over the prior week to demonstrate decreased constipation.    Baseline Pt. unable to have regula BM's without medication, manometry shows PFM dysfunction. As of 4/29: still having to use and enema or supository occasionally and still needing linzess. As of 6/10: Pt.  has days where she can empty ok and days where she needs to use an Enema to stimulate a BM, feels that it is just the insertion of the enema, not the solution that does the job.    Time 10    Period Weeks    Status On-going    Target Date 12/30/19                 Plan - 10/31/19 0929    Clinical Impression Statement Pt. Response to treatment was limited today due to likely ongoing UTI as well as neurologic concerns. She responded better to low-frequency e-stim than to high-frequency for spasm reduction but did not demonstrate as significant spasm reduction and pain relief as she has seen from prior applications. We hope to see greater improvement next week following hypogastric nerve block. She will continue to benefit from skilled physical therapy to work toward remaining goals and maximize function as well as decrease likelihood of symptom increase or recurrence.    PT Next Visit Plan ask about neural glides on LLE, re-assess for TTP, try TENS  near coccyx, focus on graded activity, and seated and standing balance/core strength. review I's Y's and T's in prone, thoracic mobility? perform further release and PNF for R>L shoulder blades, more scar-release and cupping at suprapubic scar, MFR around B ilium. TPDN to multifidus through T-L  junction with E-stim at '40hz'$  and at 2 hz. continue to work on thoracolumbar rotation ROM and decreasing spasms, add SLS balance training, begin strengthening for reciprocal inhibition.    PT Home Exercise Plan A day: low back stretch, side-stretch,  hamstring stretch, butterfly stretch, hip EXT in prone with knee bent, side-lying hip ABD, calf raises.  B day: Thoracic extensions,  I's, Y's, T's, and W's, self internal TP release, child's pose stretch, DF and Eversion at the ankle. Every day: Pain diary, Posterior pelvic tilts with deep core activation and red band for obliques activation knee fall-out, bow-and-arrow, TENS unit 3+ times per day for 30 min and as  needed if pain starts to increase.    Consulted and Agree with Plan of Care Patient           Patient will benefit from skilled therapeutic intervention in order to improve the following deficits and impairments:     Visit Diagnosis: Other muscle spasm  Muscle weakness (generalized)  Difficulty in walking, not elsewhere classified     Problem List Patient Active Problem List   Diagnosis Date Noted  . Seizure ([place]) 11/11/2018  . Major depressive disorder, recurrent episode, moderate ([place]) 02/07/2018  . Nephrolithiasis 04/16/2016  . Numbness 07/28/2015  . Bladder retention 06/23/2015  . Abdominal pain 06/04/2015  . Dizziness 05/18/2015  . Neck pain 05/18/2015  . Complicated migraine [id number]/41/[id number]  . Other fatigue 04/28/2015  . Abnormal finding on MRI of brain 04/28/2015  . D (diarrhea) 03/29/2015  . H/O disease 03/29/2015  . Abnormal weight loss 03/29/2015  . Muscle weakness (generalized) 03/14/2015  . Headache, migraine 03/10/2015   Willa Rough DPT, ATC Willa Rough 10/31/2019, 9:30 AM  [place] MAIN [facility] SERVICES [address] [place], [place], [address] Phone: [phone]   Fax:  [phone]  Name: Christine Chavez MRN: [id number] Date of Birth: 1980/05/22

## 2019-11-06 ENCOUNTER — Other Ambulatory Visit: Payer: Self-pay

## 2019-11-06 ENCOUNTER — Ambulatory Visit: Payer: BC Managed Care – PPO

## 2019-11-06 DIAGNOSIS — M62838 Other muscle spasm: Secondary | ICD-10-CM | POA: Diagnosis not present

## 2019-11-06 DIAGNOSIS — R262 Difficulty in walking, not elsewhere classified: Secondary | ICD-10-CM

## 2019-11-06 DIAGNOSIS — M6281 Muscle weakness (generalized): Secondary | ICD-10-CM

## 2019-11-06 NOTE — Therapy (Signed)
[place] MAIN [facility] SERVICES [address] [place], [place], [address] Phone: [phone]   Fax:  [phone]  Physical Therapy Treatment  The patient has been informed of current processes in place at Outpatient Rehab to protect patients from Covid-19 exposure including social distancing, schedule modifications, and new cleaning procedures. After discussing their particular risk with a therapist based on the patient's personal risk factors, the patient has decided to proceed with in-person therapy.  Patient Details  Name: Christine Chavez MRN: [id number] Date of Birth: 06-22-1980 Referring Provider (PT): [name]   Encounter Date: 11/06/2019   PT End of Session - 11/07/19 1353    Visit Number 43    Number of Visits 76    Date for PT Re-Evaluation 12/30/19    Authorization Type [employer]    Authorization Time Period from 10/22/2019 through 12/30/2019    Authorization - Visit Number 3    Authorization - Number of Visits 20    Progress Note Due on Visit 12    PT Start Time 1430    PT Stop Time 1530    PT Time Calculation (min) 60 min    Activity Tolerance Patient tolerated treatment well;No increased pain    Behavior During Therapy WFL for tasks assessed/performed           Past Medical History:  Diagnosis Date  . Complication of anesthesia    ? seizures after anesthesia   . Headache   . Migraines   . Neurogenic bladder   . Renal disorder   . Vision abnormalities     Past Surgical History:  Procedure Laterality Date  . ANTERIOR CRUCIATE LIGAMENT REPAIR  1997  . APPENDECTOMY    . COLONOSCOPY WITH PROPOFOL N/A 11/11/2018   Procedure: COLONOSCOPY WITH PROPOFOL;  Surgeon: [name], MD;  Location: [facility] ENDOSCOPY;  Service: Endoscopy;  Laterality: N/A;  . CYSTOSCOPY WITH STENT PLACEMENT Right 04/17/2016   Procedure: CYSTOSCOPY WITH STENT PLACEMENT;  Surgeon: [name], MD;  Location: [facility] ORS;  Service:  Urology;  Laterality: Right;  . ESOPHAGOGASTRODUODENOSCOPY (EGD) WITH PROPOFOL N/A 11/11/2018   Procedure: ESOPHAGOGASTRODUODENOSCOPY (EGD) WITH PROPOFOL;  Surgeon: [name], MD;  Location: [facility] ENDOSCOPY;  Service: Endoscopy;  Laterality: N/A;  . EXPLORATORY LAPAROTOMY  1999  . KIDNEY STONE SURGERY  04/2016  . REVISION UROSTOMY CUTANEOUS    . REVISION UROSTOMY CUTANEOUS  01/10/2018  . SUPRAPUBIC CATHETER PLACEMENT  08/2017  . TONSILLECTOMY      There were no vitals filed for this visit.   Pelvic Floor Physical Therapy Treatment Note  SCREENING  Changes in medications, allergies, or medical history?: Had her hypogastric nerve block on Tuesday.  SUBJECTIVE  Patient reports: Has had an interesting week. The nerve block hurt more when they did it. Her legs hurt really bad when she was trying to go to bed. Found that using topical magnesium and her sleep meds where the only thing that helped. She had heart burn afterward. She was nauseated yesterday and had bloating. Today she is not as bad and feels like the spasms may not be as bad. Hopes she is moving in the right direction but she is feeling weak and not herself yet. Was at a 5-6/10 before the block but is ~ 3-4 today. Mostly in the L side, 2-3 in the pelvis. Still having some pain when emptying her bladder but not as much. Also started her period on Tuesday.  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 Be able to take fewer medications and be able to enjoy spending time with her family again,  doing "all the mom things"    OBJECTIVE  Changes in:  Observations/Posture:  L lumbar curve prominent with transverse processes more prominent on the L. (from previous note)  Range of Motion/Flexibilty:  (from 12/10) Spine: ~ 2-3 fingers from knee with SB B. ~ 50% reduced R rotation with stretch in L, ~ 75% reduced L rotation with increased tightness. HS: able to achieve 90 deg. Hip flexion in seated HS stretch. (from prior treatment)  Strength/MMT:  LE MMT (from 12-10) LE MMT Left Right  Hip flex:  (L2) /5 /5  Hip ext: 3+/5 3+/5  Hip abd: 4/5 4/5  Hip add: 4/5 3+/5  Hip IR 4/5 3+/5  Hip ER 4/5 4/5   (From 3/24): Pt. Demonstrates 3/5 strength with "I's, Y's, and T's" but 4/5 with W's. (from 6/22) DF: 9 reps with B. Eversion/Inversion: 4/5 on R, 4+/5 on L. DF: L 4+/5, R 3+/5  Seated IR/ER on L: 4/5, R 3+/5  Balance/stability:  (6-29) Pt. Able to achieve good posture without cueing but only able to maintain for ~ 2.5 min. Before seeking stability from the table and forward rolled shoulders. With cue to engage the deep core, Pt. Recognizes increased stability and comfort.   Pelvic Floor External Exam: (From prior session)- [Introitus Appears: elevated Skin integrity: WNL Palpation: TTP to B STP Cough: limited motion Prolapse visible?: no Scar mobility: not assessed  Internal Vaginal Exam: Strength (PERF): Pt. Has difficulty sensing the PFM to generate contraction or relaxation due to high tone/neuro involvement Symmetry: L>R for spasm/TTP Palpation: TTP to all muscles throughout B Prolapse: none]   Abdominal:  Pt. Demonstrated ability to recruit R>L obliques and TA with pelvic tilts. (from prior session)  Palpation: TTP to L QL and Erector spinae  Gait Analysis:  INTERVENTIONS THIS SESSION:  Manual: Performed fascial release and TP release to L QL and Erector spinae  to decrease tension on nerves and help decrease spasms of the bladder and PFM to decrease  pain and decrease likelihood of UTI.  Dry-needle: Performed TPDN with a .30x59m needle and standard approach as well as E-stim as described below to decrease spasm and pain and allow for improved balance of musculature for improved function and decreased symptoms.  E-stim: Performed E-stim with TENS unit at 2 mA frequency with Width of 300 and "normal" setting. To continue to down-regulate pain and spasm in muscles innervated by ~ T12 nerve roots to allow faster reduction of pain following nerve block.  Total time: 60 min.                       Trigger Point Dry Needling - 11/07/19 0001    Consent Given? Yes    Education Handout Provided No    Muscles Treated Back/Hip Erector spinae;Quadratus lumborum    Lumbar multifidi Response Twitch response elicited;Palpable increased muscle length    Iliopsoas Response Twitch response elicited;Palpable increased muscle length  PT Short Term Goals - 10/21/19 1408      PT SHORT TERM GOAL #1   Title Patient will demonstrate coordinated diaphragmatic breathing with pelvic tilts to demonstrate improved control of diaphragm and TA, to allow for further strengthening of core musculature and decreased pelvic floor spasm.    Baseline Pt. demonstrates breathing dysfunction and poor PFM coordination evidenced by anal manometry As of 2/22: Pt. continues to have restriction in her diaphragm and near T/L junction but is able to intentionally use diaphragmatic breathing through the available ROM.    Time 5    Period Weeks    Status Achieved    Target Date 04/20/19      PT SHORT TERM GOAL #2   Title Patient will report a reduction in pain to no greater than 6/10 over the prior week to demonstrate symptom improvement.    Baseline Pain is 10/10 at worst, 2/10 at best As of 2/22: Pt. had achieved this goal for 1 week as of 1/21 but may have infection or other insult that caused pain to increase again. As of 4/29: Pain high  of 8/10 but has been over-doing her activity over the past week. As of 6/10: Pt. was able to keep pain at 6 or below since prior visit by using TENS to help keep tension lower but has not had a full week to test if it will keep working. As of 7/6: Pt. had met this goal but has a new UTI and it caused pain to spike to 8/10 over the weekend.    Time 5    Period Weeks    Status On-going    Target Date 10/23/19      PT SHORT TERM GOAL #3   Title Patient will demonstrate HEP x1 in the clinic to demonstrate understanding and proper form to allow for further improvement.    Baseline Pt. lacks knowledge of therepeutic exercises that can decrease her pain/Sx.    Time 5    Period Weeks    Status Achieved    Target Date 04/20/19      PT SHORT TERM GOAL #4   Title Patient will report consistent use of foot-stool (squatty-potty) for positioning with BM to decrease pain with BM and intra-abdominal pressure.    Baseline Pt. having constipation due to PFM dysfunction    Time 5    Period Weeks    Status Achieved    Target Date 04/20/19             PT Long Term Goals - 10/21/19 1424      PT LONG TERM GOAL #1   Title Pt. will be able to participate in regular ADL's with least restrictive device without pain increasing greater than 2/10    Baseline Pt. limited in her ability to perform household duties by increased pain and fatigue. As of 4/29: Her legs have more endurance with walking before she gets a tremor. Still needs to sit down the majority of the time she is cooking. Still needs back support for prolonged sitting. As of 6/10: Pt. able to do for  ~1 hour before she needs to rest to prevent increased pain, pain is averaging ~ 4-5/10    Time 10    Period Weeks    Status On-going    Target Date 12/30/19      PT LONG TERM GOAL #2   Title Patient will score at or below 65/300  on the PFDI and 35% on the Female NIH-CPSI  to demonstrate a clinically meaningful decrease in disability and distress due  to pelvic floor dysfunction.    Baseline PFDI: 110/300, Female NIH-CPSI: 29/43 (67%) As of 4/29: 100/300 on PFDI and 30/43 on NIH-CPSI As of 7/6:  NIH-CPSI: 32/43 POPDI: 61/100, CRADI-8: 29/100, UDI: 44/100 (PFDI: 149/300)    Time 10    Period Weeks    Status On-going    Target Date 12/30/19      PT LONG TERM GOAL #3   Title Patient will report no pain with intercourse to demonstrate improved functional ability.    Baseline Pt. Having significant pain with intercourse. As of 4/29: is a little better (20-30%) but is worse on days where overall tension/pain is worse. As of 6/10: can have days where there is not much pain but other days where there is significant pain.    Time 10    Period Weeks    Status On-going      PT LONG TERM GOAL #4   Title Pt will report ability to work in her yard for greater than 30 minutes without extreme fatigue    Baseline limited to 10-15 minutes before pt requires ending activity. As of 4/29: ~ 20 min. As of 6/10: has not been gardening much but can do ~ 30 min of similar indoor activity level before increased fatigue.    Time 10    Period Weeks    Status Achieved    Target Date 10/23/19      PT LONG TERM GOAL #5   Title Pt. will be able to go 2-3 hours between emptying her bladder without increased pain and empty her bladder fully whether by urostomy, cath, or voluntary release.    Baseline Pt. has a urostomy but continues to have to self-catheterize. Has pain with bladder filling and when using catheter. As of 4/29: is doing better since starting to take the pregabalin andd baclofen but occasionally still has pain with bladder filling. It is bad when she has an infection or over-does activity, better on "normal" days which have been few and far between due to frequent UTI's. As of 6/10: Pt. has "good" and "bad" days but has more days where she can go 2-3 hours without increased pain    Time 10    Period Weeks    Status On-going    Target Date 12/30/19       PT LONG TERM GOAL #6   Title Patient will report having BM's at least every-other day with consistency between [facility] stool scale 3-5 over the prior week to demonstrate decreased constipation.    Baseline Pt. unable to have regula BM's without medication, manometry shows PFM dysfunction. As of 4/29: still having to use and enema or supository occasionally and still needing linzess. As of 6/10: Pt. has days where she can empty ok and days where she needs to use an Enema to stimulate a BM, feels that it is just the insertion of the enema, not the solution that does the job.    Time 10    Period Weeks    Status On-going    Target Date 12/30/19                 Plan - 11/07/19 1354    Clinical Impression Statement Pt. Responded well to all interventions today, demonstrating improved spasm and decreased pain as well as understanding of how to use new TENS unit to continue to manage spasm and pain and understanding and correct performance of  all education and exercises provided today. They will continue to benefit from skilled physical therapy to work toward remaining goals and maximize function as well as decrease likelihood of symptom increase or recurrence.    PT Next Visit Plan ask about neural glides on LLE, re-assess for TTP, try TENS near coccyx, focus on graded activity, and seated and standing balance/core strength. review I's Y's and T's in prone, thoracic mobility? perform further release and PNF for R>L shoulder blades, more scar-release and cupping at suprapubic scar, MFR around B ilium. TPDN to multifidus through T-L  junction with E-stim at 40hz and at 2 hz. continue to work on thoracolumbar rotation ROM and decreasing spasms, add SLS balance training, begin strengthening for reciprocal inhibition.    PT Home Exercise Plan A day: low back stretch, side-stretch,  hamstring stretch, butterfly stretch, hip EXT in prone with knee bent, side-lying hip ABD, calf raises.  B day: Thoracic  extensions,  I's, Y's, T's, and W's, self internal TP release, child's pose stretch, DF and Eversion at the ankle. Every day: Pain diary, Posterior pelvic tilts with deep core activation and red band for obliques activation knee fall-out, bow-and-arrow, TENS unit 3+ times per day for 30 min and as needed if pain starts to increase.    Consulted and Agree with Plan of Care Patient           Patient will benefit from skilled therapeutic intervention in order to improve the following deficits and impairments:     Visit Diagnosis: Other muscle spasm  Muscle weakness (generalized)  Difficulty in walking, not elsewhere classified     Problem List Patient Active Problem List   Diagnosis Date Noted  . Seizure ([place]) 11/11/2018  . Major depressive disorder, recurrent episode, moderate ([place]) 02/07/2018  . Nephrolithiasis 04/16/2016  . Numbness 07/28/2015  . Bladder retention 06/23/2015  . Abdominal pain 06/04/2015  . Dizziness 05/18/2015  . Neck pain 05/18/2015  . Complicated migraine 11/94/1740  . Other fatigue 04/28/2015  . Abnormal finding on MRI of brain 04/28/2015  . D (diarrhea) 03/29/2015  . H/O disease 03/29/2015  . Abnormal weight loss 03/29/2015  . Muscle weakness (generalized) 03/14/2015  . Headache, migraine 03/10/2015   Willa Rough DPT, ATC Willa Rough 11/07/2019, 1:56 PM  [place] MAIN [facility] SERVICES [address] [place], [place], [address] Phone: [phone]   Fax:  [phone]  Name: Christine Chavez MRN: [id number] Date of Birth: 02-Jan-1981

## 2019-11-11 ENCOUNTER — Ambulatory Visit: Payer: BC Managed Care – PPO

## 2019-11-13 ENCOUNTER — Ambulatory Visit: Payer: BC Managed Care – PPO

## 2019-11-13 ENCOUNTER — Other Ambulatory Visit: Payer: Self-pay

## 2019-11-13 DIAGNOSIS — M62838 Other muscle spasm: Secondary | ICD-10-CM | POA: Diagnosis not present

## 2019-11-13 DIAGNOSIS — M6281 Muscle weakness (generalized): Secondary | ICD-10-CM

## 2019-11-13 DIAGNOSIS — R262 Difficulty in walking, not elsewhere classified: Secondary | ICD-10-CM

## 2019-11-13 NOTE — Patient Instructions (Signed)
     Start with Shoulder Retraction and Downward Rotation pictures above, holding the position while pulling the chin straight back as if trying to make a "double chin".  Breathe in forward and breathe out as you pull back, repeating this _3x10__ times __1-2__ times per day.  

## 2019-11-13 NOTE — Therapy (Signed)
[place] MAIN [facility] SERVICES [address] [place], [place], [address] Phone: [phone]   Fax:  [phone]  Physical Therapy Treatment  The patient has been informed of current processes in place at Outpatient Rehab to protect patients from Covid-19 exposure including social distancing, schedule modifications, and new cleaning procedures. After discussing their particular risk with a therapist based on the patient's personal risk factors, the patient has decided to proceed with in-person therapy.  Patient Details  Name: Christine Chavez MRN: [id number] Date of Birth: 09/09/1980 Referring Provider (PT): [name]   Encounter Date: 11/13/2019   PT End of Session - 11/14/19 0956    Visit Number 102    Number of Visits 52    Date for PT Re-Evaluation 12/30/19    Authorization Type [employer]    Authorization Time Period from 10/22/2019 through 12/30/2019    Authorization - Visit Number 4    Authorization - Number of Visits 20    Progress Note Due on Visit 65    PT Start Time 1430    PT Stop Time 1530    PT Time Calculation (min) 60 min    Activity Tolerance Patient tolerated treatment well;No increased pain    Behavior During Therapy WFL for tasks assessed/performed           Past Medical History:  Diagnosis Date  . Complication of anesthesia    ? seizures after anesthesia   . Headache   . Migraines   . Neurogenic bladder   . Renal disorder   . Vision abnormalities     Past Surgical History:  Procedure Laterality Date  . ANTERIOR CRUCIATE LIGAMENT REPAIR  1997  . APPENDECTOMY    . COLONOSCOPY WITH PROPOFOL N/A 11/11/2018   Procedure: COLONOSCOPY WITH PROPOFOL;  Surgeon: [name], MD;  Location: [facility] ENDOSCOPY;  Service: Endoscopy;  Laterality: N/A;  . CYSTOSCOPY WITH STENT PLACEMENT Right 04/17/2016   Procedure: CYSTOSCOPY WITH STENT PLACEMENT;  Surgeon: [name], MD;  Location: [facility] ORS;  Service:  Urology;  Laterality: Right;  . ESOPHAGOGASTRODUODENOSCOPY (EGD) WITH PROPOFOL N/A 11/11/2018   Procedure: ESOPHAGOGASTRODUODENOSCOPY (EGD) WITH PROPOFOL;  Surgeon: [name], MD;  Location: [facility] ENDOSCOPY;  Service: Endoscopy;  Laterality: N/A;  . EXPLORATORY LAPAROTOMY  1999  . KIDNEY STONE SURGERY  04/2016  . REVISION UROSTOMY CUTANEOUS    . REVISION UROSTOMY CUTANEOUS  01/10/2018  . SUPRAPUBIC CATHETER PLACEMENT  08/2017  . TONSILLECTOMY      There were no vitals filed for this visit.  Pelvic Floor Physical Therapy Treatment Note  SCREENING  Changes in medications, allergies, or medical history?: Had her hypogastric nerve block on Tuesday.  SUBJECTIVE  Patient reports: She was having worse visual changes and twitching in her R eyelid and near the R eye as well as feeling very fatigued and sick on her stomach Monday and Tuesday. Her neurologist still did not want to schedule an MRI but encouraged her to see opthalmology and see what they think about getting an MRI but they cannot see her until December. Bladder spasms have improved, not 100%. Has not had much of an appetite and has severe exhaustion. Had her B-12 injection but does not feel like she got any increased energy from it. Still gets a surge of "ouch" when emptying the bladder. Getting more periodic relief more often.    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  Patient Goals: Get rid of the discomfort with catheterizing and ache/bladder pain. Be able to take fewer medications and be able to enjoy spending time with her family again,  doing "all the mom things"    OBJECTIVE  Changes in:  Observations/Posture:  L lumbar curve prominent with transverse processes more prominent on the L. (from previous note)  Range of Motion/Flexibilty:  (from 12/10) Spine: ~ 2-3 fingers from knee with SB B. ~ 50% reduced R rotation with stretch in L, ~ 75% reduced L rotation with increased tightness. HS: able to achieve 90 deg. Hip flexion in seated HS stretch. (from prior treatment)  TODAY: decreased mobility surrounding the sacrum and through entire thoracic spine.  Strength/MMT:  LE MMT (from 12-10) LE MMT Left Right  Hip flex:  (L2) /5 /5  Hip ext: 3+/5 3+/5  Hip abd: 4/5 4/5  Hip add: 4/5 3+/5  Hip IR 4/5 3+/5  Hip ER 4/5 4/5   (From 3/24): Pt. Demonstrates 3/5 strength with "I's, Y's, and T's" but 4/5 with W's. (from 6/22) DF: 9 reps with B. Eversion/Inversion: 4/5 on R, 4+/5 on L. DF: L 4+/5, R 3+/5  Seated IR/ER on L: 4/5, R 3+/5  Balance/stability:  (6-29) Pt. Able to achieve good posture without cueing but only able to maintain for ~ 2.5 min. Before seeking stability from the table and forward rolled shoulders. With cue to engage the deep core, Pt. Recognizes increased stability and comfort.   Pelvic Floor External Exam: (From prior session)- [Introitus Appears: elevated Skin integrity: WNL Palpation: TTP to B STP Cough: limited motion Prolapse visible?: no Scar mobility: not assessed  Internal Vaginal Exam: Strength (PERF): Pt. Has difficulty sensing the PFM to generate contraction or relaxation due to high tone/neuro involvement Symmetry: L>R for spasm/TTP Palpation: TTP to all muscles throughout B Prolapse: none]   Abdominal:  Pt. Demonstrated ability to recruit R>L obliques and TA with pelvic tilts. (from prior session)  Palpation: TTP to L QL and Erector spinae (from previous session)  Gait Analysis:  INTERVENTIONS THIS SESSION:  Manual: Performed PA mobs along thorac spine and at all  sacral borders to improve mobility of joint and surrounding connective tissue and decrease pressure on nerve roots for improved conductivity and function of down-stream tissues.   Therex: Educated on and practiced chin-tucks and reviewed scapular retractions in seated to improve strength of muscles opposing tight musculature to allow reciprocal inhibition to improve balance of musculature surrounding the pelvis and improve overall posture for optimal musculature length-tension relationship and function.  Self care: Discussed continuing concerns over visual changes and encouraged Pt. To follow up with her primary care on Monday to see if they can order an MRI. Also encouraged to follow her gut on getting a urinalysis for UTI.   Total time: 60 min.                              PT Short Term Goals - 10/21/19 1408      PT SHORT TERM GOAL #1   Title Patient will demonstrate coordinated diaphragmatic breathing with pelvic tilts to demonstrate improved control of diaphragm and TA,  to allow for further strengthening of core musculature and decreased pelvic floor spasm.    Baseline Pt. demonstrates breathing dysfunction and poor PFM coordination evidenced by anal manometry As of 2/22: Pt. continues to have restriction in her diaphragm and near T/L junction but is able to intentionally use diaphragmatic breathing through the available ROM.    Time 5    Period Weeks    Status Achieved    Target Date 04/20/19      PT SHORT TERM GOAL #2   Title Patient will report a reduction in pain to no greater than 6/10 over the prior week to demonstrate symptom improvement.    Baseline Pain is 10/10 at worst, 2/10 at best As of 2/22: Pt. had achieved this goal for 1 week as of 1/21 but may have infection or other insult that caused pain to increase again. As of 4/29: Pain high of 8/10 but has been over-doing her activity over the past week. As of 6/10: Pt. was able to keep pain at 6 or below  since prior visit by using TENS to help keep tension lower but has not had a full week to test if it will keep working. As of 7/6: Pt. had met this goal but has a new UTI and it caused pain to spike to 8/10 over the weekend.    Time 5    Period Weeks    Status On-going    Target Date 10/23/19      PT SHORT TERM GOAL #3   Title Patient will demonstrate HEP x1 in the clinic to demonstrate understanding and proper form to allow for further improvement.    Baseline Pt. lacks knowledge of therepeutic exercises that can decrease her pain/Sx.    Time 5    Period Weeks    Status Achieved    Target Date 04/20/19      PT SHORT TERM GOAL #4   Title Patient will report consistent use of foot-stool (squatty-potty) for positioning with BM to decrease pain with BM and intra-abdominal pressure.    Baseline Pt. having constipation due to PFM dysfunction    Time 5    Period Weeks    Status Achieved    Target Date 04/20/19             PT Long Term Goals - 10/21/19 1424      PT LONG TERM GOAL #1   Title Pt. will be able to participate in regular ADL's with least restrictive device without pain increasing greater than 2/10    Baseline Pt. limited in her ability to perform household duties by increased pain and fatigue. As of 4/29: Her legs have more endurance with walking before she gets a tremor. Still needs to sit down the majority of the time she is cooking. Still needs back support for prolonged sitting. As of 6/10: Pt. able to do for  ~1 hour before she needs to rest to prevent increased pain, pain is averaging ~ 4-5/10    Time 10    Period Weeks    Status On-going    Target Date 12/30/19      PT LONG TERM GOAL #2   Title Patient will score at or below 65/300  on the PFDI and 35% on the Female NIH-CPSI to demonstrate a clinically meaningful decrease in disability and distress due to pelvic floor dysfunction.    Baseline PFDI: 110/300, Female NIH-CPSI: 29/43 (67%) As of 4/29: 100/300 on PFDI  and 30/43 on NIH-CPSI As  of 7/6:  NIH-CPSI: 32/43 POPDI: 61/100, CRADI-8: 29/100, UDI: 44/100 (PFDI: 149/300)    Time 10    Period Weeks    Status On-going    Target Date 12/30/19      PT LONG TERM GOAL #3   Title Patient will report no pain with intercourse to demonstrate improved functional ability.    Baseline Pt. Having significant pain with intercourse. As of 4/29: is a little better (20-30%) but is worse on days where overall tension/pain is worse. As of 6/10: can have days where there is not much pain but other days where there is significant pain.    Time 10    Period Weeks    Status On-going      PT LONG TERM GOAL #4   Title Pt will report ability to work in her yard for greater than 30 minutes without extreme fatigue    Baseline limited to 10-15 minutes before pt requires ending activity. As of 4/29: ~ 20 min. As of 6/10: has not been gardening much but can do ~ 30 min of similar indoor activity level before increased fatigue.    Time 10    Period Weeks    Status Achieved    Target Date 10/23/19      PT LONG TERM GOAL #5   Title Pt. will be able to go 2-3 hours between emptying her bladder without increased pain and empty her bladder fully whether by urostomy, cath, or voluntary release.    Baseline Pt. has a urostomy but continues to have to self-catheterize. Has pain with bladder filling and when using catheter. As of 4/29: is doing better since starting to take the pregabalin andd baclofen but occasionally still has pain with bladder filling. It is bad when she has an infection or over-does activity, better on "normal" days which have been few and far between due to frequent UTI's. As of 6/10: Pt. has "good" and "bad" days but has more days where she can go 2-3 hours without increased pain    Time 10    Period Weeks    Status On-going    Target Date 12/30/19      PT LONG TERM GOAL #6   Title Patient will report having BM's at least every-other day with consistency between  [facility] stool scale 3-5 over the prior week to demonstrate decreased constipation.    Baseline Pt. unable to have regula BM's without medication, manometry shows PFM dysfunction. As of 4/29: still having to use and enema or supository occasionally and still needing linzess. As of 6/10: Pt. has days where she can empty ok and days where she needs to use an Enema to stimulate a BM, feels that it is just the insertion of the enema, not the solution that does the job.    Time 10    Period Weeks    Status On-going    Target Date 12/30/19                 Plan - 11/14/19 0957    Clinical Impression Statement Pt. Responded well to all interventions today, demonstrating improved spinal mobility and decreased back pain, as well as understanding and correct performance of all education and exercises provided today. They will continue to benefit from skilled physical therapy to work toward remaining goals and maximize function as well as decrease likelihood of symptom increase or recurrence.     PT Next Visit Plan ask about neural glides on LLE, re-assess for TTP, try  TENS near coccyx, focus on graded activity, and seated and standing balance/core strength. review I's Y's and T's in prone, thoracic mobility? perform further release and PNF for R>L shoulder blades, more scar-release and cupping at suprapubic scar, MFR around B ilium. TPDN to multifidus through T-L  junction with E-stim at _0  and at 2 hz. continue to work on thoracolumbar rotation ROM and decreasing spasms, add SLS balance training, begin strengthening for reciprocal inhibition.    PT Home Exercise Plan A day: low back stretch, side-stretch,  hamstring stretch, butterfly stretch, hip EXT in prone with knee bent, side-lying hip ABD, calf raises.  B day: Thoracic extensions,  I's, Y's, T's, and W's, self internal TP release, child's pose stretch, DF and Eversion at the ankle. Every day: Pain diary, Posterior pelvic tilts with deep core  activation and red band for obliques activation knee fall-out, bow-and-arrow, TENS unit 3+ times per day for 30 min and as needed if pain starts to increase.    Consulted and Agree with Plan of Care Patient           Patient will benefit from skilled therapeutic intervention in order to improve the following deficits and impairments:     Visit Diagnosis: Other muscle spasm  Muscle weakness (generalized)  Difficulty in walking, not elsewhere classified     Problem List Patient Active Problem List   Diagnosis Date Noted  . Seizure ([place]) 11/11/2018  . Major depressive disorder, recurrent episode, moderate ([place]) 02/07/2018  . Nephrolithiasis 04/16/2016  . Numbness 07/28/2015  . Bladder retention 06/23/2015  . Abdominal pain 06/04/2015  . Dizziness 05/18/2015  . Neck pain 05/18/2015  . Complicated migraine 83/[id number]/[id number]  . Other fatigue 04/28/2015  . Abnormal finding on MRI of brain 04/28/2015  . D (diarrhea) 03/29/2015  . H/O disease 03/29/2015  . Abnormal weight loss 03/29/2015  . Muscle weakness (generalized) 03/14/2015  . Headache, migraine 03/10/2015   Willa Rough DPT, ATC Willa Rough 11/14/2019, 9:58 AM  [place] MAIN [facility] SERVICES [address] [place], [place], [address] Phone: [phone]   Fax:  [phone]  Name: Christine Chavez MRN: [id number] Date of Birth: [birth year]

## 2019-11-18 ENCOUNTER — Other Ambulatory Visit: Payer: Self-pay

## 2019-11-18 ENCOUNTER — Ambulatory Visit: Payer: BC Managed Care – PPO | Attending: Gastroenterology

## 2019-11-18 DIAGNOSIS — M62838 Other muscle spasm: Secondary | ICD-10-CM | POA: Diagnosis present

## 2019-11-18 DIAGNOSIS — M6281 Muscle weakness (generalized): Secondary | ICD-10-CM | POA: Diagnosis present

## 2019-11-18 DIAGNOSIS — R262 Difficulty in walking, not elsewhere classified: Secondary | ICD-10-CM | POA: Diagnosis present

## 2019-11-18 NOTE — Therapy (Signed)
[place] MAIN [facility] SERVICES [address] [place], [place], [address] Phone: [phone]   Fax:  [phone]  Physical Therapy Treatment  The patient has been informed of current processes in place at Outpatient Rehab to protect patients from Covid-19 exposure including social distancing, schedule modifications, and new cleaning procedures. After discussing their particular risk with a therapist based on the patient's personal risk factors, the patient has decided to proceed with in-person therapy.  Patient Details  Name: Myracle Febres MRN: [id number] Date of Birth: 04-06-1981 Referring Provider (PT): [name]   Encounter Date: 11/18/2019   PT End of Session - 11/18/19 1510    Visit Number [id number]    Number of Visits 18    Date for PT Re-Evaluation 12/30/19    Authorization Type [employer]    Authorization Time Period from 10/22/2019 through 12/30/2019    Authorization - Visit Number 5    Authorization - Number of Visits 20    Progress Note Due on Visit 3    PT Start Time 1430    PT Stop Time 1530    PT Time Calculation (min) 60 min    Activity Tolerance Patient tolerated treatment well;No increased pain    Behavior During Therapy WFL for tasks assessed/performed           Past Medical History:  Diagnosis Date  . Complication of anesthesia    ? seizures after anesthesia   . Headache   . Migraines   . Neurogenic bladder   . Renal disorder   . Vision abnormalities     Past Surgical History:  Procedure Laterality Date  . ANTERIOR CRUCIATE LIGAMENT REPAIR  1997  . APPENDECTOMY    . COLONOSCOPY WITH PROPOFOL N/A 11/11/2018   Procedure: COLONOSCOPY WITH PROPOFOL;  Surgeon: [name], MD;  Location: [facility] ENDOSCOPY;  Service: Endoscopy;  Laterality: N/A;  . CYSTOSCOPY WITH STENT PLACEMENT Right 04/17/2016   Procedure: CYSTOSCOPY WITH STENT PLACEMENT;  Surgeon: [name], MD;  Location: [facility] ORS;  Service:  Urology;  Laterality: Right;  . ESOPHAGOGASTRODUODENOSCOPY (EGD) WITH PROPOFOL N/A 11/11/2018   Procedure: ESOPHAGOGASTRODUODENOSCOPY (EGD) WITH PROPOFOL;  Surgeon: [name], MD;  Location: [facility] ENDOSCOPY;  Service: Endoscopy;  Laterality: N/A;  . EXPLORATORY LAPAROTOMY  1999  . KIDNEY STONE SURGERY  04/2016  . REVISION UROSTOMY CUTANEOUS    . REVISION UROSTOMY CUTANEOUS  01/10/2018  . SUPRAPUBIC CATHETER PLACEMENT  08/2017  . TONSILLECTOMY      There were no vitals filed for this visit.   Pelvic Floor Physical Therapy Treatment Note  SCREENING  Changes in medications, allergies, or medical history?: negative for hereditary spastic paraplegia (HSP), tapering to '10mg'$  of Trintellix and going to increase BuPROPion.   SUBJECTIVE  Patient reports: She spoke to her Primary care MD and she agreed that she needed to not wait until December. The test to see if she has HSP was negative. She is waiting for results to see if the urine culture comes back negative or not. Shew was able to talk to Dr. Werner Lean and he agreed she should see Dr. Truman Hayward again since this is "outside of his scope". She has an appointment with Dr. Truman Hayward on the 26th. who will be able to order the correct MRI if needed (the original neurologist). She is still feeling more "pressure" rather than spasm but very uncomfortable. The back was as low as a 1 or 2/10 at one point though. Feels like the nerve  block was a success. Stoma has been "burning" and was really irritated when she took a shower and changed the bag.   Precautions:  Neurological diagnosis pending, has "non-specific" brain lesions   Pain update:  Location of pain: L Flank pain (pelvic area) Current pain:  4/10 (4/10, increases with motion) Max pain:  6/10 (8/10 when it spasms) Least pain:  2/10 (3/10) Nature of pain: sharp to dull ache (grabbing)  *following treatment pain 3/10 in L side/back no change in pelvic spasm.  Patient Goals: Get rid of the  discomfort with catheterizing and ache/bladder pain. Be able to take fewer medications and be able to enjoy spending time with her family again, doing "all the mom things"    OBJECTIVE  Changes in:  Observations/Posture:  L lumbar curve prominent with transverse processes more prominent on the L. (from previous note)  Range of Motion/Flexibilty:  (from 12/10) Spine: ~ 2-3 fingers from knee with SB B. ~ 50% reduced R rotation with stretch in L, ~ 75% reduced L rotation with increased tightness. HS: able to achieve 90 deg. Hip flexion in seated HS stretch. (from prior treatment)  TODAY: decreased mobility surrounding the sacrum and through entire thoracic spine.  Strength/MMT:  LE MMT (from 12-10) LE MMT Left Right  Hip flex:  (L2) /5 /5  Hip ext: 3+/5 3+/5  Hip abd: 4/5 4/5  Hip add: 4/5 3+/5  Hip IR 4/5 3+/5  Hip ER 4/5 4/5   (From 3/24): Pt. Demonstrates 3/5 strength with "I's, Y's, and T's" but 4/5 with W's. (from 6/22) DF: 9 reps with B. Eversion/Inversion: 4/5 on R, 4+/5 on L. DF: L 4+/5, R 3+/5  Seated IR/ER on L: 4/5, R 3+/5  Balance/stability:  (6-29) Pt. Able to achieve good posture without cueing but only able to maintain for ~ 2.5 min. Before seeking stability from the table and forward rolled shoulders. With cue to engage the deep core, Pt. Recognizes increased stability and comfort.   Pelvic Floor External Exam: (From prior session)- [Introitus Appears: elevated Skin integrity: WNL Palpation: TTP to B STP Cough: limited motion Prolapse visible?: no Scar mobility: not assessed  Internal Vaginal Exam: Strength (PERF): Pt. Has difficulty sensing the PFM to generate contraction or relaxation due to high tone/neuro involvement Symmetry: L>R for spasm/TTP Palpation: TTP to all muscles throughout B Prolapse: none]  TODAY: TTP to all PFM   Abdominal:  Pt. Demonstrated ability to recruit R>L obliques and TA with pelvic tilts. (from prior  session)  Palpation: TTP to L QL and Erector spinae (from previous session)  Gait Analysis:  INTERVENTIONS THIS SESSION:  Manual: Performed TP release and STM to PFM at introitus to decrease spasm and pain and allow for improved balance of musculature for improved function and decreased symptoms.  Dry-needle: Performed TPDN with a .30x78m needle and standard approach as described below to decrease spasm and pain and allow for improved balance of musculature for improved function and decreased symptoms.  Self care: Discussed new test results, pending urinalysis and decided it was worth trying to do DN to PFM again due to improvement from hypogastric nerve block  Total time: 60 min.                     Trigger Point Dry Needling - 11/18/19 0001    Consent Given? Yes    Education Handout Provided No    Muscles Treated Back/Hip Obturator internus    Dry Needling Comments PFM at posterior  fourchette                  PT Short Term Goals - 10/21/19 1408      PT SHORT TERM GOAL #1   Title Patient will demonstrate coordinated diaphragmatic breathing with pelvic tilts to demonstrate improved control of diaphragm and TA, to allow for further strengthening of core musculature and decreased pelvic floor spasm.    Baseline Pt. demonstrates breathing dysfunction and poor PFM coordination evidenced by anal manometry As of 2/22: Pt. continues to have restriction in her diaphragm and near T/L junction but is able to intentionally use diaphragmatic breathing through the available ROM.    Time 5    Period Weeks    Status Achieved    Target Date 04/20/19      PT SHORT TERM GOAL #2   Title Patient will report a reduction in pain to no greater than 6/10 over the prior week to demonstrate symptom improvement.    Baseline Pain is 10/10 at worst, 2/10 at best As of 2/22: Pt. had achieved this goal for 1 week as of 1/21 but may have infection or other insult that caused pain to  increase again. As of 4/29: Pain high of 8/10 but has been over-doing her activity over the past week. As of 6/10: Pt. was able to keep pain at 6 or below since prior visit by using TENS to help keep tension lower but has not had a full week to test if it will keep working. As of 7/6: Pt. had met this goal but has a new UTI and it caused pain to spike to 8/10 over the weekend.    Time 5    Period Weeks    Status On-going    Target Date 10/23/19      PT SHORT TERM GOAL #3   Title Patient will demonstrate HEP x1 in the clinic to demonstrate understanding and proper form to allow for further improvement.    Baseline Pt. lacks knowledge of therepeutic exercises that can decrease her pain/Sx.    Time 5    Period Weeks    Status Achieved    Target Date 04/20/19      PT SHORT TERM GOAL #4   Title Patient will report consistent use of foot-stool (squatty-potty) for positioning with BM to decrease pain with BM and intra-abdominal pressure.    Baseline Pt. having constipation due to PFM dysfunction    Time 5    Period Weeks    Status Achieved    Target Date 04/20/19             PT Long Term Goals - 10/21/19 1424      PT LONG TERM GOAL #1   Title Pt. will be able to participate in regular ADL's with least restrictive device without pain increasing greater than 2/10    Baseline Pt. limited in her ability to perform household duties by increased pain and fatigue. As of 4/29: Her legs have more endurance with walking before she gets a tremor. Still needs to sit down the majority of the time she is cooking. Still needs back support for prolonged sitting. As of 6/10: Pt. able to do for  ~1 hour before she needs to rest to prevent increased pain, pain is averaging ~ 4-5/10    Time 10    Period Weeks    Status On-going    Target Date 12/30/19      PT LONG TERM GOAL #2  Title Patient will score at or below 65/300  on the PFDI and 35% on the Female NIH-CPSI to demonstrate a clinically meaningful  decrease in disability and distress due to pelvic floor dysfunction.    Baseline PFDI: 110/300, Female NIH-CPSI: 29/43 (67%) As of 4/29: 100/300 on PFDI and 30/43 on NIH-CPSI As of 7/6:  NIH-CPSI: 32/43 POPDI: 61/100, CRADI-8: 29/100, UDI: 44/100 (PFDI: 149/300)    Time 10    Period Weeks    Status On-going    Target Date 12/30/19      PT LONG TERM GOAL #3   Title Patient will report no pain with intercourse to demonstrate improved functional ability.    Baseline Pt. Having significant pain with intercourse. As of 4/29: is a little better (20-30%) but is worse on days where overall tension/pain is worse. As of 6/10: can have days where there is not much pain but other days where there is significant pain.    Time 10    Period Weeks    Status On-going      PT LONG TERM GOAL #4   Title Pt will report ability to work in her yard for greater than 30 minutes without extreme fatigue    Baseline limited to 10-15 minutes before pt requires ending activity. As of 4/29: ~ 20 min. As of 6/10: has not been gardening much but can do ~ 30 min of similar indoor activity level before increased fatigue.    Time 10    Period Weeks    Status Achieved    Target Date 10/23/19      PT LONG TERM GOAL #5   Title Pt. will be able to go 2-3 hours between emptying her bladder without increased pain and empty her bladder fully whether by urostomy, cath, or voluntary release.    Baseline Pt. has a urostomy but continues to have to self-catheterize. Has pain with bladder filling and when using catheter. As of 4/29: is doing better since starting to take the pregabalin andd baclofen but occasionally still has pain with bladder filling. It is bad when she has an infection or over-does activity, better on "normal" days which have been few and far between due to frequent UTI's. As of 6/10: Pt. has "good" and "bad" days but has more days where she can go 2-3 hours without increased pain    Time 10    Period Weeks    Status  On-going    Target Date 12/30/19      PT LONG TERM GOAL #6   Title Patient will report having BM's at least every-other day with consistency between [facility] stool scale 3-5 over the prior week to demonstrate decreased constipation.    Baseline Pt. unable to have regula BM's without medication, manometry shows PFM dysfunction. As of 4/29: still having to use and enema or supository occasionally and still needing linzess. As of 6/10: Pt. has days where she can empty ok and days where she needs to use an Enema to stimulate a BM, feels that it is just the insertion of the enema, not the solution that does the job.    Time 10    Period Weeks    Status On-going    Target Date 12/30/19                 Plan - 11/18/19 1510    Clinical Impression Statement Pt. Responded well though seemingly incompletely to all interventions today, demonstrating decreased PFM spasm posteriorly>anteriorly  With some continued  pain remaining at EOS. Pt. Reports "much less" anterior spasm/pain than at prior attempt to use TPDN on PFM, but some remaining regardless. They will continue to benefit from skilled physical therapy to work toward remaining goals and maximize function as well as decrease likelihood of symptom increase or recurrence.    PT Next Visit Plan ask about neural glides on LLE, re-assess for TTP, try TENS near coccyx, focus on graded activity, and seated and standing balance/core strength. review I's Y's and T's in prone, thoracic mobility? perform further release and PNF for R>L shoulder blades, more scar-release and cupping at suprapubic scar, MFR around B ilium. TPDN to multifidus through T-L  junction with E-stim at '40hz'$  and at 2 hz. continue to work on thoracolumbar rotation ROM and decreasing spasms, add SLS balance training, begin strengthening for reciprocal inhibition.    PT Home Exercise Plan A day: low back stretch, side-stretch,  hamstring stretch, butterfly stretch, hip EXT in prone with knee  bent, side-lying hip ABD, calf raises.  B day: Thoracic extensions,  I's, Y's, T's, and W's, self internal TP release, child's pose stretch, DF and Eversion at the ankle. Every day: Pain diary, Posterior pelvic tilts with deep core activation and red band for obliques activation knee fall-out, bow-and-arrow, TENS unit 3+ times per day for 30 min and as needed if pain starts to increase.    Consulted and Agree with Plan of Care Patient           Patient will benefit from skilled therapeutic intervention in order to improve the following deficits and impairments:     Visit Diagnosis: Other muscle spasm  Muscle weakness (generalized)  Difficulty in walking, not elsewhere classified     Problem List Patient Active Problem List   Diagnosis Date Noted  . Seizure ([place]) 11/11/2018  . Major depressive disorder, recurrent episode, moderate ([place]) 02/07/2018  . Nephrolithiasis 04/16/2016  . Numbness 07/28/2015  . Bladder retention 06/23/2015  . Abdominal pain 06/04/2015  . Dizziness 05/18/2015  . Neck pain 05/18/2015  . Complicated migraine 90/90/3014  . Other fatigue 04/28/2015  . Abnormal finding on MRI of brain 04/28/2015  . D (diarrhea) 03/29/2015  . H/O disease 03/29/2015  . Abnormal weight loss 03/29/2015  . Muscle weakness (generalized) 03/14/2015  . Headache, migraine 03/10/2015   Willa Rough DPT, ATC Willa Rough 11/18/2019, 3:17 PM  [place] MAIN [facility] SERVICES [address] [place], [place], [address] Phone: [phone]   Fax:  [phone]  Name: Terie Lear MRN: [id number] Date of Birth: [birth year]

## 2019-11-20 ENCOUNTER — Ambulatory Visit: Payer: BC Managed Care – PPO

## 2019-11-20 ENCOUNTER — Other Ambulatory Visit: Payer: Self-pay

## 2019-11-20 DIAGNOSIS — R262 Difficulty in walking, not elsewhere classified: Secondary | ICD-10-CM

## 2019-11-20 DIAGNOSIS — M62838 Other muscle spasm: Secondary | ICD-10-CM | POA: Diagnosis not present

## 2019-11-20 DIAGNOSIS — M6281 Muscle weakness (generalized): Secondary | ICD-10-CM

## 2019-11-20 NOTE — Therapy (Signed)
[place] MAIN [facility] SERVICES [address] [place], [place], [address] Phone: [phone]   Fax:  [phone]  Physical Therapy Treatment  The patient has been informed of current processes in place at Outpatient Rehab to protect patients from Covid-19 exposure including social distancing, schedule modifications, and new cleaning procedures. After discussing their particular risk with a therapist based on the patient's personal risk factors, the patient has decided to proceed with in-person therapy.  Patient Details  Name: Christine Chavez MRN: [id number] Date of Birth: 1980/11/17 Referring Provider (PT): [name]   Encounter Date: 11/20/2019   PT End of Session - 11/20/19 1227    Visit Number 17    Number of Visits 32    Date for PT Re-Evaluation 12/30/19    Authorization Type [employer]    Authorization Time Period from 10/22/2019 through 12/30/2019    Authorization - Visit Number 6    Authorization - Number of Visits 20    Progress Note Due on Visit 18    PT Start Time 1030    PT Stop Time 1130    PT Time Calculation (min) 60 min    Activity Tolerance Patient tolerated treatment well;No increased pain    Behavior During Therapy WFL for tasks assessed/performed           Past Medical History:  Diagnosis Date  . Complication of anesthesia    ? seizures after anesthesia   . Headache   . Migraines   . Neurogenic bladder   . Renal disorder   . Vision abnormalities     Past Surgical History:  Procedure Laterality Date  . ANTERIOR CRUCIATE LIGAMENT REPAIR  1997  . APPENDECTOMY    . COLONOSCOPY WITH PROPOFOL N/A 11/11/2018   Procedure: COLONOSCOPY WITH PROPOFOL;  Surgeon: [name], MD;  Location: [facility] ENDOSCOPY;  Service: Endoscopy;  Laterality: N/A;  . CYSTOSCOPY WITH STENT PLACEMENT Right 04/17/2016   Procedure: CYSTOSCOPY WITH STENT PLACEMENT;  Surgeon: [name], MD;  Location: [facility] ORS;  Service:  Urology;  Laterality: Right;  . ESOPHAGOGASTRODUODENOSCOPY (EGD) WITH PROPOFOL N/A 11/11/2018   Procedure: ESOPHAGOGASTRODUODENOSCOPY (EGD) WITH PROPOFOL;  Surgeon: [name], MD;  Location: [facility] ENDOSCOPY;  Service: Endoscopy;  Laterality: N/A;  . EXPLORATORY LAPAROTOMY  1999  . KIDNEY STONE SURGERY  04/2016  . REVISION UROSTOMY CUTANEOUS    . REVISION UROSTOMY CUTANEOUS  01/10/2018  . SUPRAPUBIC CATHETER PLACEMENT  08/2017  . TONSILLECTOMY      There were no vitals filed for this visit.  Pelvic Floor Physical Therapy Treatment Note  SCREENING  Changes in medications, allergies, or medical history?: negative for hereditary spastic paraplegia (HSP), tapering to '10mg'$  of Trintellix and going to increase BuPROPion.   SUBJECTIVE  Patient reports: Still not "spasming like she was" but having a lot of pressure down there. Saw her culture results and can tell that she has an infection again/still. Feels weaker in general. Hurting a lot in her back still but thinks it is because of the infection. Pain in the pelvic area was best ~ 2 hours after last session but is bad again, seems to be from infection. Her L knee feels like it hyperextended and it hurts and feels swollen now.   Precautions:  Neurological diagnosis pending, has "non-specific" brain lesions   Pain update:  Location of pain: L Flank pain (pelvic area) Current pain:  4/10 (4/10, increases with motion) Max pain:  7/10 (5/10 when it spasms) Least  pain:  2/10 (2/10) Nature of pain: sharp to dull ache (grabbing)  *following treatment decreased L knee pain.   Patient Goals: Get rid of the discomfort with catheterizing and ache/bladder pain. Be able to take fewer medications and be able to enjoy spending time with her family again, doing "all the mom things"    OBJECTIVE  Changes in:  Observations/Posture:  L lumbar curve prominent with transverse processes more prominent on the L. (from previous note)  Range of  Motion/Flexibilty:  (from 12/10) Spine: ~ 2-3 fingers from knee with SB B. ~ 50% reduced R rotation with stretch in L, ~ 75% reduced L rotation with increased tightness. HS: able to achieve 90 deg. Hip flexion in seated HS stretch. (from prior treatment)  TODAY: decreased mobility surrounding the sacrum and through entire thoracic spine.  Strength/MMT:  LE MMT (from 12-10) LE MMT Left Right  Hip flex:  (L2) /5 /5  Hip ext: 3+/5 3+/5  Hip abd: 4/5 4/5  Hip add: 4/5 3+/5  Hip IR 4/5 3+/5  Hip ER 4/5 4/5   (From 3/24): Pt. Demonstrates 3/5 strength with "I's, Y's, and T's" but 4/5 with W's. (from 6/22) DF: 9 reps with B. Eversion/Inversion: 4/5 on R, 4+/5 on L. DF: L 4+/5, R 3+/5  Seated IR/ER on L: 4/5, R 3+/5  Today: L DF and Eversion 4+/5 with first 1-3 reps and fatigue decreased to 4/4.   Balance/stability:  (6-29) Pt. Able to achieve good posture without cueing but only able to maintain for ~ 2.5 min. Before seeking stability from the table and forward rolled shoulders. With cue to engage the deep core, Pt. Recognizes increased stability and comfort.  8/5: Pt. Demonstrating decreased stability in gait and standing balance today with fasciculations increasing in direct relation to muscular endurance required. Able to stand ~ 10 sec. In SLS with MIN UE support before intensity of spasms make attempt at balance futile.    Pelvic Floor External Exam: (From prior session)- [Introitus Appears: elevated Skin integrity: WNL Palpation: TTP to B STP Cough: limited motion Prolapse visible?: no Scar mobility: not assessed  Internal Vaginal Exam: Strength (PERF): Pt. Has difficulty sensing the PFM to generate contraction or relaxation due to high tone/neuro involvement Symmetry: L>R for spasm/TTP Palpation: TTP to all muscles throughout B Prolapse: none]  TODAY: TTP to all PFM   Abdominal:  Pt. Demonstrated ability to recruit R>L obliques and TA with pelvic tilts. (from prior  session)  Palpation: TTP to L popliteus and biceps femoris   Gait Analysis:  INTERVENTIONS THIS SESSION:  Manual: Performed TP release to L popliteus and biceps femoris to decrease spasm and pain and allow for improved balance of musculature for improved function and decreased symptoms.  Theract: Applied sensitive-skin kinesiotape to L posterior knee with proximal and distal anchors to prevent hyperextension and allow for improved tissue healing as Pt. Is having decreased NM control due to UTI. Reviewed Jonette Eva and how she would be able to replace 2 of the 3 products so she would not be just adding expense.   Therex: Performed SLS with MIN UE support and ~ 10 sec hold 2x3 on each side. Performed DF with no resistance 1x10 B and but DF and eversion with manual resistance and 5 sec. Hold on L 2x5 to re-test and increase strength to decrease risk of tripping and falling.    Total time: 60 min.  PT Short Term Goals - 10/21/19 1408      PT SHORT TERM GOAL #1   Title Patient will demonstrate coordinated diaphragmatic breathing with pelvic tilts to demonstrate improved control of diaphragm and TA, to allow for further strengthening of core musculature and decreased pelvic floor spasm.    Baseline Pt. demonstrates breathing dysfunction and poor PFM coordination evidenced by anal manometry As of 2/22: Pt. continues to have restriction in her diaphragm and near T/L junction but is able to intentionally use diaphragmatic breathing through the available ROM.    Time 5    Period Weeks    Status Achieved    Target Date 04/20/19      PT SHORT TERM GOAL #2   Title Patient will report a reduction in pain to no greater than 6/10 over the prior week to demonstrate symptom improvement.    Baseline Pain is 10/10 at worst, 2/10 at best As of 2/22: Pt. had achieved this goal for 1 week as of 1/21 but may have infection or other insult that caused pain to  increase again. As of 4/29: Pain high of 8/10 but has been over-doing her activity over the past week. As of 6/10: Pt. was able to keep pain at 6 or below since prior visit by using TENS to help keep tension lower but has not had a full week to test if it will keep working. As of 7/6: Pt. had met this goal but has a new UTI and it caused pain to spike to 8/10 over the weekend.    Time 5    Period Weeks    Status On-going    Target Date 10/23/19      PT SHORT TERM GOAL #3   Title Patient will demonstrate HEP x1 in the clinic to demonstrate understanding and proper form to allow for further improvement.    Baseline Pt. lacks knowledge of therepeutic exercises that can decrease her pain/Sx.    Time 5    Period Weeks    Status Achieved    Target Date 04/20/19      PT SHORT TERM GOAL #4   Title Patient will report consistent use of foot-stool (squatty-potty) for positioning with BM to decrease pain with BM and intra-abdominal pressure.    Baseline Pt. having constipation due to PFM dysfunction    Time 5    Period Weeks    Status Achieved    Target Date 04/20/19             PT Long Term Goals - 10/21/19 1424      PT LONG TERM GOAL #1   Title Pt. will be able to participate in regular ADL's with least restrictive device without pain increasing greater than 2/10    Baseline Pt. limited in her ability to perform household duties by increased pain and fatigue. As of 4/29: Her legs have more endurance with walking before she gets a tremor. Still needs to sit down the majority of the time she is cooking. Still needs back support for prolonged sitting. As of 6/10: Pt. able to do for  ~1 hour before she needs to rest to prevent increased pain, pain is averaging ~ 4-5/10    Time 10    Period Weeks    Status On-going    Target Date 12/30/19      PT LONG TERM GOAL #2   Title Patient will score at or below 65/300  on the PFDI and 35% on the Female NIH-CPSI  to demonstrate a clinically meaningful  decrease in disability and distress due to pelvic floor dysfunction.    Baseline PFDI: 110/300, Female NIH-CPSI: 29/43 (67%) As of 4/29: 100/300 on PFDI and 30/43 on NIH-CPSI As of 7/6:  NIH-CPSI: 32/43 POPDI: 61/100, CRADI-8: 29/100, UDI: 44/100 (PFDI: 149/300)    Time 10    Period Weeks    Status On-going    Target Date 12/30/19      PT LONG TERM GOAL #3   Title Patient will report no pain with intercourse to demonstrate improved functional ability.    Baseline Pt. Having significant pain with intercourse. As of 4/29: is a little better (20-30%) but is worse on days where overall tension/pain is worse. As of 6/10: can have days where there is not much pain but other days where there is significant pain.    Time 10    Period Weeks    Status On-going      PT LONG TERM GOAL #4   Title Pt will report ability to work in her yard for greater than 30 minutes without extreme fatigue    Baseline limited to 10-15 minutes before pt requires ending activity. As of 4/29: ~ 20 min. As of 6/10: has not been gardening much but can do ~ 30 min of similar indoor activity level before increased fatigue.    Time 10    Period Weeks    Status Achieved    Target Date 10/23/19      PT LONG TERM GOAL #5   Title Pt. will be able to go 2-3 hours between emptying her bladder without increased pain and empty her bladder fully whether by urostomy, cath, or voluntary release.    Baseline Pt. has a urostomy but continues to have to self-catheterize. Has pain with bladder filling and when using catheter. As of 4/29: is doing better since starting to take the pregabalin andd baclofen but occasionally still has pain with bladder filling. It is bad when she has an infection or over-does activity, better on "normal" days which have been few and far between due to frequent UTI's. As of 6/10: Pt. has "good" and "bad" days but has more days where she can go 2-3 hours without increased pain    Time 10    Period Weeks    Status  On-going    Target Date 12/30/19      PT LONG TERM GOAL #6   Title Patient will report having BM's at least every-other day with consistency between [facility] stool scale 3-5 over the prior week to demonstrate decreased constipation.    Baseline Pt. unable to have regula BM's without medication, manometry shows PFM dysfunction. As of 4/29: still having to use and enema or supository occasionally and still needing linzess. As of 6/10: Pt. has days where she can empty ok and days where she needs to use an Enema to stimulate a BM, feels that it is just the insertion of the enema, not the solution that does the job.    Time 10    Period Weeks    Status On-going    Target Date 12/30/19                 Plan - 11/20/19 1227    Clinical Impression Statement Pt. Responded well to all interventions today, demonstrating improved L knee pain and L foot strength, as well as understanding and correct performance of all education and exercises provided today. They will continue to benefit from skilled  physical therapy to work toward remaining goals and maximize function as well as decrease likelihood of symptom increase or recurrence.    PT Next Visit Plan ask about UTI Sx, if using uqora yet, neural glides on LLE, re-assess for TTP, try TENS near coccyx, focus on graded activity, and seated and standing balance/core strength. review I's Y's and T's in prone, thoracic mobility? perform further release and PNF for R>L shoulder blades, more scar-release and cupping at suprapubic scar, MFR around B ilium. TPDN to multifidus through T-L  junction with E-stim at '40hz'$  and at 2 hz. continue to work on thoracolumbar rotation ROM and decreasing spasms, add SLS balance training, begin strengthening for reciprocal inhibition.    PT Home Exercise Plan A day: low back stretch, side-stretch,  hamstring stretch, butterfly stretch, hip EXT in prone with knee bent, side-lying hip ABD, calf raises.  B day: Thoracic extensions,   I's, Y's, T's, and W's, self internal TP release, child's pose stretch, DF and Eversion at the ankle. Every day: Pain diary, Posterior pelvic tilts with deep core activation and red band for obliques activation knee fall-out, bow-and-arrow, TENS unit 3+ times per day for 30 min and as needed if pain starts to increase.    Consulted and Agree with Plan of Care Patient           Patient will benefit from skilled therapeutic intervention in order to improve the following deficits and impairments:     Visit Diagnosis: Other muscle spasm  Muscle weakness (generalized)  Difficulty in walking, not elsewhere classified     Problem List Patient Active Problem List   Diagnosis Date Noted  . Seizure ([place]) 11/11/2018  . Major depressive disorder, recurrent episode, moderate ([place]) 02/07/2018  . Nephrolithiasis 04/16/2016  . Numbness 07/28/2015  . Bladder retention 06/23/2015  . Abdominal pain 06/04/2015  . Dizziness 05/18/2015  . Neck pain 05/18/2015  . Complicated migraine 65/06/5463  . Other fatigue 04/28/2015  . Abnormal finding on MRI of brain 04/28/2015  . D (diarrhea) 03/29/2015  . H/O disease 03/29/2015  . Abnormal weight loss 03/29/2015  . Muscle weakness (generalized) 03/14/2015  . Headache, migraine 03/10/2015   Willa Rough DPT, ATC Willa Rough 11/20/2019, 12:29 PM  [place] MAIN [facility] SERVICES [address] [place], [place], [address] Phone: [phone]   Fax:  [phone]  Name: Yanai Hobson MRN: [id number] Date of Birth: [birth year]

## 2019-12-09 ENCOUNTER — Ambulatory Visit: Payer: BC Managed Care – PPO

## 2019-12-11 ENCOUNTER — Ambulatory Visit: Payer: BC Managed Care – PPO

## 2019-12-16 ENCOUNTER — Ambulatory Visit: Payer: BC Managed Care – PPO

## 2019-12-16 ENCOUNTER — Other Ambulatory Visit: Payer: Self-pay

## 2019-12-16 DIAGNOSIS — M6281 Muscle weakness (generalized): Secondary | ICD-10-CM

## 2019-12-16 DIAGNOSIS — M62838 Other muscle spasm: Secondary | ICD-10-CM | POA: Diagnosis not present

## 2019-12-16 DIAGNOSIS — R262 Difficulty in walking, not elsewhere classified: Secondary | ICD-10-CM

## 2019-12-16 NOTE — Therapy (Signed)
[place] MAIN [facility] SERVICES [address] [place], [place], [address] Phone: [phone]   Fax:  [phone]  Physical Therapy Treatment  The patient has been informed of current processes in place at Outpatient Rehab to protect patients from Covid-19 exposure including social distancing, schedule modifications, and new cleaning procedures. After discussing their particular risk with a therapist based on the patient's personal risk factors, the patient has decided to proceed with in-person therapy.   Patient Details  Name: Christine Chavez MRN: [id number] Date of Birth: 1980-12-12 Referring Provider (PT): [name]   Encounter Date: 12/16/2019   PT End of Session - 12/16/19 1225    Visit Number 52    Number of Visits 56    Date for PT Re-Evaluation 12/30/19    Authorization Type [employer]    Authorization Time Period from 10/22/2019 through 12/30/2019    Authorization - Visit Number 7    Authorization - Number of Visits 20    Progress Note Due on Visit 1    PT Start Time 1030    PT Stop Time 1130    PT Time Calculation (min) 60 min    Activity Tolerance Patient tolerated treatment well;No increased pain    Behavior During Therapy WFL for tasks assessed/performed           Past Medical History:  Diagnosis Date  . Complication of anesthesia    ? seizures after anesthesia   . Headache   . Migraines   . Neurogenic bladder   . Renal disorder   . Vision abnormalities     Past Surgical History:  Procedure Laterality Date  . ANTERIOR CRUCIATE LIGAMENT REPAIR  1997  . APPENDECTOMY    . COLONOSCOPY WITH PROPOFOL N/A 11/11/2018   Procedure: COLONOSCOPY WITH PROPOFOL;  Surgeon: [name], MD;  Location: [facility] ENDOSCOPY;  Service: Endoscopy;  Laterality: N/A;  . CYSTOSCOPY WITH STENT PLACEMENT Right 04/17/2016   Procedure: CYSTOSCOPY WITH STENT PLACEMENT;  Surgeon: [name], MD;  Location: [facility] ORS;  Service:  Urology;  Laterality: Right;  . ESOPHAGOGASTRODUODENOSCOPY (EGD) WITH PROPOFOL N/A 11/11/2018   Procedure: ESOPHAGOGASTRODUODENOSCOPY (EGD) WITH PROPOFOL;  Surgeon: [name], MD;  Location: [facility] ENDOSCOPY;  Service: Endoscopy;  Laterality: N/A;  . EXPLORATORY LAPAROTOMY  1999  . KIDNEY STONE SURGERY  04/2016  . REVISION UROSTOMY CUTANEOUS    . REVISION UROSTOMY CUTANEOUS  01/10/2018  . SUPRAPUBIC CATHETER PLACEMENT  08/2017  . TONSILLECTOMY      There were no vitals filed for this visit.  Pelvic Floor Physical Therapy Treatment Note  SCREENING  Changes in medications, allergies, or medical history?: Had Covid-19, is scheduled for a urodynamics test Sept 22nd.  SUBJECTIVE  Patient reports: Her family had covid other than one son. Her MD told her she will likely have about 3 months before she will be back to herself. Her associate pastor is in the hospital due to covid. Had to go to the hospital too because she had a bladder infection and had covid. She also had a fall when her walker got caught on a lip when her husband was pushing her to a restroom and she landed her back. Had to go to the ER, [facility] was having issues with space so they did the antibody infusion and fluids and an antibiotic in the ER   Precautions:  Neurological diagnosis pending, has "non-specific" brain lesions   Pain update:  Location of pain: L Flank pain (pelvic area)  Current pain:  5/10 (5/10, increases with motion) Max pain:  9/10 (9/10 when it spasms) Least pain:  3/10 (3/10) Nature of pain: sharp to dull ache (grabbing)  *Following treatment Pt. Reports improved ease of breathing.  Patient Goals: Get rid of the discomfort with catheterizing and ache/bladder pain. Be able to take fewer medications and be able to enjoy spending time with her family again, doing "all the mom things"    OBJECTIVE  Changes in:  Observations/Posture:  L lumbar curve prominent with transverse processes more  prominent on the L. (from previous note) Forward head and shoulders, appears fatigued.  Range of Motion/Flexibilty:  (from 12/10) Spine: ~ 2-3 fingers from knee with SB B. ~ 50% reduced R rotation with stretch in L, ~ 75% reduced L rotation with increased tightness. HS: able to achieve 90 deg. Hip flexion in seated HS stretch. (from prior treatment)   Strength/MMT:  LE MMT (from 12-10) LE MMT Left Right  Hip flex:  (L2) /5 /5  Hip ext: 3+/5 3+/5  Hip abd: 4/5 4/5  Hip add: 4/5 3+/5  Hip IR 4/5 3+/5  Hip ER 4/5 4/5   (From 3/24): Pt. Demonstrates 3/5 strength with "I's, Y's, and T's" but 4/5 with W's. (from 6/22) DF: 9 reps with B. Eversion/Inversion: 4/5 on R, 4+/5 on L. DF: L 4+/5, R 3+/5  Seated IR/ER on L: 4/5, R 3+/5 (from 8-5) L DF and Eversion 4+/5 with first 1-3 reps and fatigue decreased to 4/4.   Balance/stability:  (6-29) Pt. Able to achieve good posture without cueing but only able to maintain for ~ 2.5 min. Before seeking stability from the table and forward rolled shoulders. With cue to engage the deep core, Pt. Recognizes increased stability and comfort.  8/5: Pt. Demonstrating decreased stability in gait and standing balance today with fasciculations increasing in direct relation to muscular endurance required. Able to stand ~ 10 sec. In SLS with MIN UE support before intensity of spasms make attempt at balance futile.    Pelvic Floor External Exam: (From prior session)- [Introitus Appears: elevated Skin integrity: WNL Palpation: TTP to B STP Cough: limited motion Prolapse visible?: no Scar mobility: not assessed  Internal Vaginal Exam: Strength (PERF): Pt. Has difficulty sensing the PFM to generate contraction or relaxation due to high tone/neuro involvement Symmetry: L>R for spasm/TTP Palpation: TTP to all muscles throughout B Prolapse: none]  TODAY: TTP to all PFM   Abdominal:  Pt. Demonstrated ability to recruit R>L obliques and TA with pelvic  tilts. (from prior session)  Palpation: TTP to L popliteus and biceps femoris   Gait Analysis:  INTERVENTIONS THIS SESSION:  Manual: Performed TP release to B diaphragm to decrease spasm and pain and allow for improved ease of breathing and balance of musculature for improved function and decreased symptoms.  Self-care: Discussed ramifications of recent Covid-19 infection and took history of illness. Discussed plan to start low-impact and build back up to prior activity levels as well as encouraged Pt. To follow her pain-medicine doctor's recommendation regarding repeat nerve block/ablation as it seemed to make a positive impact. Educated Pt. On potential risks/benefits of getting vaccinated for herself and family and given resources to allow for further self-study and decision making. Educated on where to go to learn more about hand-controls and accessible vans to improve Pt's independence.   Total time: 60 min.  PT Short Term Goals - 10/21/19 1408      PT SHORT TERM GOAL #1   Title Patient will demonstrate coordinated diaphragmatic breathing with pelvic tilts to demonstrate improved control of diaphragm and TA, to allow for further strengthening of core musculature and decreased pelvic floor spasm.    Baseline Pt. demonstrates breathing dysfunction and poor PFM coordination evidenced by anal manometry As of 2/22: Pt. continues to have restriction in her diaphragm and near T/L junction but is able to intentionally use diaphragmatic breathing through the available ROM.    Time 5    Period Weeks    Status Achieved    Target Date 04/20/19      PT SHORT TERM GOAL #2   Title Patient will report a reduction in pain to no greater than 6/10 over the prior week to demonstrate symptom improvement.    Baseline Pain is 10/10 at worst, 2/10 at best As of 2/22: Pt. had achieved this goal for 1 week as of 1/21 but may have infection or other insult  that caused pain to increase again. As of 4/29: Pain high of 8/10 but has been over-doing her activity over the past week. As of 6/10: Pt. was able to keep pain at 6 or below since prior visit by using TENS to help keep tension lower but has not had a full week to test if it will keep working. As of 7/6: Pt. had met this goal but has a new UTI and it caused pain to spike to 8/10 over the weekend.    Time 5    Period Weeks    Status On-going    Target Date 10/23/19      PT SHORT TERM GOAL #3   Title Patient will demonstrate HEP x1 in the clinic to demonstrate understanding and proper form to allow for further improvement.    Baseline Pt. lacks knowledge of therepeutic exercises that can decrease her pain/Sx.    Time 5    Period Weeks    Status Achieved    Target Date 04/20/19      PT SHORT TERM GOAL #4   Title Patient will report consistent use of foot-stool (squatty-potty) for positioning with BM to decrease pain with BM and intra-abdominal pressure.    Baseline Pt. having constipation due to PFM dysfunction    Time 5    Period Weeks    Status Achieved    Target Date 04/20/19             PT Long Term Goals - 10/21/19 1424      PT LONG TERM GOAL #1   Title Pt. will be able to participate in regular ADL's with least restrictive device without pain increasing greater than 2/10    Baseline Pt. limited in her ability to perform household duties by increased pain and fatigue. As of 4/29: Her legs have more endurance with walking before she gets a tremor. Still needs to sit down the majority of the time she is cooking. Still needs back support for prolonged sitting. As of 6/10: Pt. able to do for  ~1 hour before she needs to rest to prevent increased pain, pain is averaging ~ 4-5/10    Time 10    Period Weeks    Status On-going    Target Date 12/30/19      PT LONG TERM GOAL #2   Title Patient will score at or below 65/300  on the PFDI and 35% on the Female NIH-CPSI  to demonstrate a  clinically meaningful decrease in disability and distress due to pelvic floor dysfunction.    Baseline PFDI: 110/300, Female NIH-CPSI: 29/43 (67%) As of 4/29: 100/300 on PFDI and 30/43 on NIH-CPSI As of 7/6:  NIH-CPSI: 32/43 POPDI: 61/100, CRADI-8: 29/100, UDI: 44/100 (PFDI: 149/300)    Time 10    Period Weeks    Status On-going    Target Date 12/30/19      PT LONG TERM GOAL #3   Title Patient will report no pain with intercourse to demonstrate improved functional ability.    Baseline Pt. Having significant pain with intercourse. As of 4/29: is a little better (20-30%) but is worse on days where overall tension/pain is worse. As of 6/10: can have days where there is not much pain but other days where there is significant pain.    Time 10    Period Weeks    Status On-going      PT LONG TERM GOAL #4   Title Pt will report ability to work in her yard for greater than 30 minutes without extreme fatigue    Baseline limited to 10-15 minutes before pt requires ending activity. As of 4/29: ~ 20 min. As of 6/10: has not been gardening much but can do ~ 30 min of similar indoor activity level before increased fatigue.    Time 10    Period Weeks    Status Achieved    Target Date 10/23/19      PT LONG TERM GOAL #5   Title Pt. will be able to go 2-3 hours between emptying her bladder without increased pain and empty her bladder fully whether by urostomy, cath, or voluntary release.    Baseline Pt. has a urostomy but continues to have to self-catheterize. Has pain with bladder filling and when using catheter. As of 4/29: is doing better since starting to take the pregabalin andd baclofen but occasionally still has pain with bladder filling. It is bad when she has an infection or over-does activity, better on "normal" days which have been few and far between due to frequent UTI's. As of 6/10: Pt. has "good" and "bad" days but has more days where she can go 2-3 hours without increased pain    Time 10     Period Weeks    Status On-going    Target Date 12/30/19      PT LONG TERM GOAL #6   Title Patient will report having BM's at least every-other day with consistency between [facility] stool scale 3-5 over the prior week to demonstrate decreased constipation.    Baseline Pt. unable to have regula BM's without medication, manometry shows PFM dysfunction. As of 4/29: still having to use and enema or supository occasionally and still needing linzess. As of 6/10: Pt. has days where she can empty ok and days where she needs to use an Enema to stimulate a BM, feels that it is just the insertion of the enema, not the solution that does the job.    Time 10    Period Weeks    Status On-going    Target Date 12/30/19                 Plan - 12/16/19 1226    Clinical Impression Statement Pt. Responded well to all interventions today, demonstrating decreased diaphragm spasm and improved ease of breathing but she is deconditioned and has less strength and endurance following her recent Covid-19 infection. She demonstrated understanding of all education  provided today. They will continue to benefit from skilled physical therapy to work toward remaining goals and maximize function as well as decrease likelihood of symptom increase or recurrence.    PT Next Visit Plan massage/cupping to back and lateral abdomen/posterior hips and HS. DIscuss building endurance back up. ask about UTI Sx, if using uqora yet, neural glides on LLE, re-assess for TTP, try TENS near coccyx, focus on graded activity, and seated and standing balance/core strength. review I's Y's and T's in prone, thoracic mobility? perform further release and PNF for R>L shoulder blades, more scar-release and cupping at suprapubic scar, MFR around B ilium. TPDN to multifidus through T-L  junction with E-stim at 40hz and at 2 hz. continue to work on thoracolumbar rotation ROM and decreasing spasms, add SLS balance training, begin strengthening for reciprocal  inhibition.    PT Home Exercise Plan A day: low back stretch, side-stretch,  hamstring stretch, butterfly stretch, hip EXT in prone with knee bent, side-lying hip ABD, calf raises.  B day: Thoracic extensions,  I's, Y's, T's, and W's, self internal TP release, child's pose stretch, DF and Eversion at the ankle. Every day: Pain diary, Posterior pelvic tilts with deep core activation and red band for obliques activation knee fall-out, bow-and-arrow, TENS unit 3+ times per day for 30 min and as needed if pain starts to increase.    Consulted and Agree with Plan of Care Patient           Patient will benefit from skilled therapeutic intervention in order to improve the following deficits and impairments:     Visit Diagnosis: Other muscle spasm  Muscle weakness (generalized)  Difficulty in walking, not elsewhere classified     Problem List Patient Active Problem List   Diagnosis Date Noted  . Seizure ([place]) 11/11/2018  . Major depressive disorder, recurrent episode, moderate ([place]) 02/07/2018  . Nephrolithiasis 04/16/2016  . Numbness 07/28/2015  . Bladder retention 06/23/2015  . Abdominal pain 06/04/2015  . Dizziness 05/18/2015  . Neck pain 05/18/2015  . Complicated migraine 04/88/8916  . Other fatigue 04/28/2015  . Abnormal finding on MRI of brain 04/28/2015  . D (diarrhea) 03/29/2015  . H/O disease 03/29/2015  . Abnormal weight loss 03/29/2015  . Muscle weakness (generalized) 03/14/2015  . Headache, migraine 03/10/2015   Willa Rough DPT, ATC Willa Rough 12/16/2019, 12:42 PM  [place] MAIN [facility] SERVICES [address] [place], [place], [address] Phone: [phone]   Fax:  [phone]  Name: Christine Chavez MRN: [id number] Date of Birth: 1981/02/19

## 2019-12-18 ENCOUNTER — Other Ambulatory Visit: Payer: Self-pay

## 2019-12-18 ENCOUNTER — Ambulatory Visit: Payer: BC Managed Care – PPO | Attending: Gastroenterology

## 2019-12-18 DIAGNOSIS — M62838 Other muscle spasm: Secondary | ICD-10-CM | POA: Diagnosis present

## 2019-12-18 DIAGNOSIS — M6281 Muscle weakness (generalized): Secondary | ICD-10-CM | POA: Diagnosis present

## 2019-12-18 DIAGNOSIS — R262 Difficulty in walking, not elsewhere classified: Secondary | ICD-10-CM | POA: Diagnosis present

## 2019-12-18 NOTE — Patient Instructions (Signed)
CHEST: Doorway, Bilateral - Standing   Both arms if narrow door                       Single arm for wide door  Standing in doorway, place hands and elbows on wall with elbows bent at shoulder height, shoulders down away from ears. Lean forward. Hold for_5_ breaths, repeat 2-3 times, 1-2 times per day.     

## 2019-12-18 NOTE — Therapy (Signed)
[place] MAIN [facility] SERVICES [address] [place], [place], [address] Phone: [phone]   Fax:  [phone]  Physical Therapy Treatment  The patient has been informed of current processes in place at Outpatient Rehab to protect patients from Covid-19 exposure including social distancing, schedule modifications, and new cleaning procedures. After discussing their particular risk with a therapist based on the patient's personal risk factors, the patient has decided to proceed with in-person therapy.  Patient Details  Name: Christine Chavez MRN: [id number] Date of Birth: [birth year] Referring Provider (PT): [name]   Encounter Date: 12/18/2019   PT End of Session - 12/18/19 1159    Visit Number 52    Number of Visits 28    Date for PT Re-Evaluation 12/30/19    Authorization Type [employer]    Authorization Time Period from 10/22/2019 through 12/30/2019    Authorization - Visit Number 8    Authorization - Number of Visits 20    Progress Note Due on Visit 16    PT Start Time 1030    PT Stop Time 1130    PT Time Calculation (min) 60 min    Activity Tolerance Patient tolerated treatment well;No increased pain    Behavior During Therapy WFL for tasks assessed/performed           Past Medical History:  Diagnosis Date  . Complication of anesthesia    ? seizures after anesthesia   . Headache   . Migraines   . Neurogenic bladder   . Renal disorder   . Vision abnormalities     Past Surgical History:  Procedure Laterality Date  . ANTERIOR CRUCIATE LIGAMENT REPAIR  1997  . APPENDECTOMY    . COLONOSCOPY WITH PROPOFOL N/A 11/11/2018   Procedure: COLONOSCOPY WITH PROPOFOL;  Surgeon: [name], MD;  Location: [facility] ENDOSCOPY;  Service: Endoscopy;  Laterality: N/A;  . CYSTOSCOPY WITH STENT PLACEMENT Right 04/17/2016   Procedure: CYSTOSCOPY WITH STENT PLACEMENT;  Surgeon: [name], MD;  Location: [facility] ORS;  Service:  Urology;  Laterality: Right;  . ESOPHAGOGASTRODUODENOSCOPY (EGD) WITH PROPOFOL N/A 11/11/2018   Procedure: ESOPHAGOGASTRODUODENOSCOPY (EGD) WITH PROPOFOL;  Surgeon: [name], MD;  Location: [facility] ENDOSCOPY;  Service: Endoscopy;  Laterality: N/A;  . EXPLORATORY LAPAROTOMY  1999  . KIDNEY STONE SURGERY  04/2016  . REVISION UROSTOMY CUTANEOUS    . REVISION UROSTOMY CUTANEOUS  01/10/2018  . SUPRAPUBIC CATHETER PLACEMENT  08/2017  . TONSILLECTOMY      There were no vitals filed for this visit.   Pelvic Floor Physical Therapy Treatment Note  SCREENING  Changes in medications, allergies, or medical history?: Is scheduled for a urodynamics test Sept 22nd and nerve ablation on the 27th.  SUBJECTIVE  Patient reports: She spoke with pain medicine Dr. Wilburn Mylar and they agree that an ablation is a good idea. That it should help for 3-4 months. She will have that done on the 27th. Also saw GI and she will be seeing a different MD too because she was she is complex. Has been having diarrhea since she had Covid. They will be checking to make sure she does not have C-diff. Has not been taking her linzess because she had diarrhea.    Precautions:  Neurological diagnosis pending, has "non-specific" brain lesions   Pain update:  Location of pain: L Flank pain (pelvic area) Current pain:  5/10 (5/10, increases with motion) Max pain:  9/10 (9/10 when it spasms) Least pain:  3/10 (  3/10) Nature of pain: sharp to dull ache (grabbing)  *Following treatment Pt. Reports 4/10 pain and improved mobility/decreased "tightness"  Patient Goals: Get rid of the discomfort with catheterizing and ache/bladder pain. Be able to take fewer medications and be able to enjoy spending time with her family again, doing "all the mom things"    OBJECTIVE  Changes in:  Observations/Posture:  L lumbar curve prominent with transverse processes more prominent on the L. (from previous note) Forward head and  shoulders, appears fatigued.  Range of Motion/Flexibilty:  (from 12/10) Spine: ~ 2-3 fingers from knee with SB B. ~ 50% reduced R rotation with stretch in L, ~ 75% reduced L rotation with increased tightness. HS: able to achieve 90 deg. Hip flexion in seated HS stretch. (from prior treatment)  TODAY: decreased spinal mobility from T 7-S1    Strength/MMT:  LE MMT (from 12-10) LE MMT Left Right  Hip flex:  (L2) /5 /5  Hip ext: 3+/5 3+/5  Hip abd: 4/5 4/5  Hip add: 4/5 3+/5  Hip IR 4/5 3+/5  Hip ER 4/5 4/5   (From 3/24): Pt. Demonstrates 3/5 strength with "I's, Y's, and T's" but 4/5 with W's. (from 6/22) DF: 9 reps with B. Eversion/Inversion: 4/5 on R, 4+/5 on L. DF: L 4+/5, R 3+/5  Seated IR/ER on L: 4/5, R 3+/5 (from 8-5) L DF and Eversion 4+/5 with first 1-3 reps and fatigue decreased to 4/4.   Balance/stability:  (6-29) Pt. Able to achieve good posture without cueing but only able to maintain for ~ 2.5 min. Before seeking stability from the table and forward rolled shoulders. With cue to engage the deep core, Pt. Recognizes increased stability and comfort.  8/5: Pt. Demonstrating decreased stability in gait and standing balance today with fasciculations increasing in direct relation to muscular endurance required. Able to stand ~ 10 sec. In SLS with MIN UE support before intensity of spasms make attempt at balance futile.    Pelvic Floor External Exam: (From prior session)- [Introitus Appears: elevated Skin integrity: WNL Palpation: TTP to B STP Cough: limited motion Prolapse visible?: no Scar mobility: not assessed  Internal Vaginal Exam: Strength (PERF): Pt. Has difficulty sensing the PFM to generate contraction or relaxation due to high tone/neuro involvement Symmetry: L>R for spasm/TTP Palpation: TTP to all muscles throughout B Prolapse: none]   Abdominal:  Pt. Demonstrated ability to recruit R>L obliques and TA with pelvic tilts. (from prior  session)  Palpation: Decreased fascial mobility through mid to low back and B lateral abdomen and TTP to B QL  Gait Analysis:  INTERVENTIONS THIS SESSION:  Manual: Performed MFR with dynamic cupping technique to lower thoracic and lumbar spine and B lateral abdomen followed by TP release to B QL followed to decrease spasm and pain and allow for improved ease of breathing and balance of musculature for improved function and decreased symptoms.  Therex: Educated on and given handout for chest stretch and reviewed and emphasized posture and scapular retraction/downward rotation to improve ability to breathe and decrease pain.  Total time: 60 min.                             PT Short Term Goals - 10/21/19 1408      PT SHORT TERM GOAL #1   Title Patient will demonstrate coordinated diaphragmatic breathing with pelvic tilts to demonstrate improved control of diaphragm and TA, to allow for further strengthening of core  musculature and decreased pelvic floor spasm.    Baseline Pt. demonstrates breathing dysfunction and poor PFM coordination evidenced by anal manometry As of 2/22: Pt. continues to have restriction in her diaphragm and near T/L junction but is able to intentionally use diaphragmatic breathing through the available ROM.    Time 5    Period Weeks    Status Achieved    Target Date 04/20/19      PT SHORT TERM GOAL #2   Title Patient will report a reduction in pain to no greater than 6/10 over the prior week to demonstrate symptom improvement.    Baseline Pain is 10/10 at worst, 2/10 at best As of 2/22: Pt. had achieved this goal for 1 week as of 1/21 but may have infection or other insult that caused pain to increase again. As of 4/29: Pain high of 8/10 but has been over-doing her activity over the past week. As of 6/10: Pt. was able to keep pain at 6 or below since prior visit by using TENS to help keep tension lower but has not had a full week to test if it  will keep working. As of 7/6: Pt. had met this goal but has a new UTI and it caused pain to spike to 8/10 over the weekend.    Time 5    Period Weeks    Status On-going    Target Date 10/23/19      PT SHORT TERM GOAL #3   Title Patient will demonstrate HEP x1 in the clinic to demonstrate understanding and proper form to allow for further improvement.    Baseline Pt. lacks knowledge of therepeutic exercises that can decrease her pain/Sx.    Time 5    Period Weeks    Status Achieved    Target Date 04/20/19      PT SHORT TERM GOAL #4   Title Patient will report consistent use of foot-stool (squatty-potty) for positioning with BM to decrease pain with BM and intra-abdominal pressure.    Baseline Pt. having constipation due to PFM dysfunction    Time 5    Period Weeks    Status Achieved    Target Date 04/20/19             PT Long Term Goals - 10/21/19 1424      PT LONG TERM GOAL #1   Title Pt. will be able to participate in regular ADL's with least restrictive device without pain increasing greater than 2/10    Baseline Pt. limited in her ability to perform household duties by increased pain and fatigue. As of 4/29: Her legs have more endurance with walking before she gets a tremor. Still needs to sit down the majority of the time she is cooking. Still needs back support for prolonged sitting. As of 6/10: Pt. able to do for  ~1 hour before she needs to rest to prevent increased pain, pain is averaging ~ 4-5/10    Time 10    Period Weeks    Status On-going    Target Date 12/30/19      PT LONG TERM GOAL #2   Title Patient will score at or below 65/300  on the PFDI and 35% on the Female NIH-CPSI to demonstrate a clinically meaningful decrease in disability and distress due to pelvic floor dysfunction.    Baseline PFDI: 110/300, Female NIH-CPSI: 29/43 (67%) As of 4/29: 100/300 on PFDI and 30/43 on NIH-CPSI As of 7/6:  NIH-CPSI: 32/43 POPDI: 61/100, CRADI-8:  29/100, UDI: 44/100 (PFDI:  149/300)    Time 10    Period Weeks    Status On-going    Target Date 12/30/19      PT LONG TERM GOAL #3   Title Patient will report no pain with intercourse to demonstrate improved functional ability.    Baseline Pt. Having significant pain with intercourse. As of 4/29: is a little better (20-30%) but is worse on days where overall tension/pain is worse. As of 6/10: can have days where there is not much pain but other days where there is significant pain.    Time 10    Period Weeks    Status On-going      PT LONG TERM GOAL #4   Title Pt will report ability to work in her yard for greater than 30 minutes without extreme fatigue    Baseline limited to 10-15 minutes before pt requires ending activity. As of 4/29: ~ 20 min. As of 6/10: has not been gardening much but can do ~ 30 min of similar indoor activity level before increased fatigue.    Time 10    Period Weeks    Status Achieved    Target Date 10/23/19      PT LONG TERM GOAL #5   Title Pt. will be able to go 2-3 hours between emptying her bladder without increased pain and empty her bladder fully whether by urostomy, cath, or voluntary release.    Baseline Pt. has a urostomy but continues to have to self-catheterize. Has pain with bladder filling and when using catheter. As of 4/29: is doing better since starting to take the pregabalin andd baclofen but occasionally still has pain with bladder filling. It is bad when she has an infection or over-does activity, better on "normal" days which have been few and far between due to frequent UTI's. As of 6/10: Pt. has "good" and "bad" days but has more days where she can go 2-3 hours without increased pain    Time 10    Period Weeks    Status On-going    Target Date 12/30/19      PT LONG TERM GOAL #6   Title Patient will report having BM's at least every-other day with consistency between [facility] stool scale 3-5 over the prior week to demonstrate decreased constipation.    Baseline Pt.  unable to have regula BM's without medication, manometry shows PFM dysfunction. As of 4/29: still having to use and enema or supository occasionally and still needing linzess. As of 6/10: Pt. has days where she can empty ok and days where she needs to use an Enema to stimulate a BM, feels that it is just the insertion of the enema, not the solution that does the job.    Time 10    Period Weeks    Status On-going    Target Date 12/30/19                 Plan - 12/18/19 1159    Clinical Impression Statement Pt. Responded well to all interventions today, demonstrating improved spinal and fascial mobility, decreased spasm and pain, as well as understanding and correct performance of all education and exercises provided today. They will continue to benefit from skilled physical therapy to work toward remaining goals and maximize function as well as decrease likelihood of symptom increase or recurrence.    PT Next Visit Plan DIscuss building endurance back up. ask about UTI Sx, if using uqora yet, neural glides on  LLE, re-assess for TTP, try TENS near coccyx, focus on graded activity, and seated and standing balance/core strength. review I's Y's and T's in prone, thoracic mobility? perform further release and PNF for R>L shoulder blades, more scar-release and cupping at suprapubic scar, MFR around B ilium. TPDN to multifidus through T-L  junction with E-stim at _0  and at 2 hz. continue to work on thoracolumbar rotation ROM and decreasing spasms, add SLS balance training, begin strengthening for reciprocal inhibition.    PT Home Exercise Plan A day: low back stretch, side-stretch,  hamstring stretch, butterfly stretch, hip EXT in prone with knee bent, side-lying hip ABD, calf raises.  B day: Chest-stretch, Thoracic extensions,  I's, Y's, T's, and W's, self internal TP release, child's pose stretch, DF and Eversion at the ankle. Every day: Pain diary, Posterior pelvic tilts with deep core activation and  red band for obliques activation knee fall-out, bow-and-arrow, TENS unit 3+ times per day for 30 min and as needed if pain starts to increase.    Consulted and Agree with Plan of Care Patient           Patient will benefit from skilled therapeutic intervention in order to improve the following deficits and impairments:     Visit Diagnosis: Other muscle spasm  Muscle weakness (generalized)  Difficulty in walking, not elsewhere classified     Problem List Patient Active Problem List   Diagnosis Date Noted  . Seizure ([place]) 11/11/2018  . Major depressive disorder, recurrent episode, moderate ([place]) 02/07/2018  . Nephrolithiasis 04/16/2016  . Numbness 07/28/2015  . Bladder retention 06/23/2015  . Abdominal pain 06/04/2015  . Dizziness 05/18/2015  . Neck pain 05/18/2015  . Complicated migraine [id number]/5521  . Other fatigue 04/28/2015  . Abnormal finding on MRI of brain 04/28/2015  . D (diarrhea) 03/29/2015  . H/O disease 03/29/2015  . Abnormal weight loss 03/29/2015  . Muscle weakness (generalized) 03/14/2015  . Headache, migraine 03/10/2015   Willa Rough DPT, ATC Willa Rough 12/18/2019, 12:15 PM  [place] MAIN [facility] SERVICES [address] [place], [place], [address] Phone: [phone]   Fax:  [phone]  Name: Christine Chavez MRN: [id number] Date of Birth: 1980/08/29

## 2019-12-23 ENCOUNTER — Ambulatory Visit: Payer: BC Managed Care – PPO

## 2019-12-23 ENCOUNTER — Other Ambulatory Visit: Payer: Self-pay

## 2019-12-23 DIAGNOSIS — R262 Difficulty in walking, not elsewhere classified: Secondary | ICD-10-CM

## 2019-12-23 DIAGNOSIS — M62838 Other muscle spasm: Secondary | ICD-10-CM

## 2019-12-23 DIAGNOSIS — M6281 Muscle weakness (generalized): Secondary | ICD-10-CM

## 2019-12-23 NOTE — Therapy (Signed)
[place] [facility] MAIN [facility] SERVICES [address] [place], [place], [address] Phone: [phone]   Fax:  [phone]  Physical Therapy Treatment  The patient has been informed of current processes in place at Outpatient Rehab to protect patients from Covid-19 exposure including social distancing, schedule modifications, and new cleaning procedures. After discussing their particular risk with a therapist based on the patient's personal risk factors, the patient has decided to proceed with in-person therapy.   Patient Details  Name: Christine Chavez MRN: [id number] Date of Birth: 1981-04-06 Referring Provider (PT): Christine Chavez   Encounter Date: 12/23/2019   PT End of Session - 12/23/19 1156    Visit Number 57    Number of Visits 68    Date for PT Re-Evaluation 12/30/19    Authorization Type [employer]    Authorization Time Period from 10/22/2019 through 12/30/2019    Authorization - Visit Number 9    Authorization - Number of Visits 20    Progress Note Due on Visit 58    PT Start Time 1030    PT Stop Time 1130    PT Time Calculation (min) 60 min    Activity Tolerance Patient tolerated treatment well;No increased pain    Behavior During Therapy WFL for tasks assessed/performed           Past Medical History:  Diagnosis Date  . Complication of anesthesia    ? seizures after anesthesia   . Headache   . Migraines   . Neurogenic bladder   . Renal disorder   . Vision abnormalities     Past Surgical History:  Procedure Laterality Date  . ANTERIOR CRUCIATE LIGAMENT REPAIR  1997  . APPENDECTOMY    . COLONOSCOPY WITH PROPOFOL N/A 11/11/2018   Procedure: COLONOSCOPY WITH PROPOFOL;  Surgeon: Christena Deem, MD;  Location: [facility] ENDOSCOPY;  Service: Endoscopy;  Laterality: N/A;  . CYSTOSCOPY WITH STENT PLACEMENT Right 04/17/2016   Procedure: CYSTOSCOPY WITH STENT PLACEMENT;  Surgeon: Malen Gauze, MD;  Location: [facility] ORS;  Service:  Urology;  Laterality: Right;  . ESOPHAGOGASTRODUODENOSCOPY (EGD) WITH PROPOFOL N/A 11/11/2018   Procedure: ESOPHAGOGASTRODUODENOSCOPY (EGD) WITH PROPOFOL;  Surgeon: Christena Deem, MD;  Location: [facility] ENDOSCOPY;  Service: Endoscopy;  Laterality: N/A;  . EXPLORATORY LAPAROTOMY  1999  . KIDNEY STONE SURGERY  04/2016  . REVISION UROSTOMY CUTANEOUS    . REVISION UROSTOMY CUTANEOUS  01/10/2018  . SUPRAPUBIC CATHETER PLACEMENT  08/2017  . TONSILLECTOMY      There were no vitals filed for this visit.   Pelvic Floor Physical Therapy Treatment Note  SCREENING  Changes in medications, allergies, or medical history?: Is scheduled for a urodynamics test Sept 22nd and nerve ablation on the 27th.  SUBJECTIVE  Patient reports: She had relief for a bit the 1-2 days after her last visit and it has gotten worse since yesterday when she had do over-do it a little to get things ready for the kids for today. She tried to take breaks but can tell she did too much.   Precautions:  Neurological diagnosis pending, has "non-specific" brain lesions   Pain update:  Location of pain: B mid back and Flank pain (pelvic area) Current pain:  6-7/10 (4/10, increases with motion) Max pain:  8/10 (5/10 when it spasms) Least pain:  3/10 (3/10) Nature of pain: sharp to dull ache (grabbing)  *Following treatment Pt. Reports soreness from manual treatment/TPDN  Patient Goals: Get rid of the discomfort with catheterizing  and ache/bladder pain. Be able to take fewer medications and be able to enjoy spending time with her family again, doing "all the mom things"    OBJECTIVE  Changes in:  Observations/Posture:  L lumbar curve prominent with transverse processes more prominent on the L. (from previous note) Forward head and shoulders, appears fatigued.  Range of Motion/Flexibilty:  (from 12/10) Spine: ~ 2-3 fingers from knee with SB B. ~ 50% reduced R rotation with stretch in L, ~ 75% reduced L rotation  with increased tightness. HS: able to achieve 90 deg. Hip flexion in seated HS stretch. (from prior treatment)  TODAY: decreased spinal mobility from T1- 8    Strength/MMT:  LE MMT (from 12-10) LE MMT Left Right  Hip flex:  (L2) /5 /5  Hip ext: 3+/5 3+/5  Hip abd: 4/5 4/5  Hip add: 4/5 3+/5  Hip IR 4/5 3+/5  Hip ER 4/5 4/5   (From 3/24): Pt. Demonstrates 3/5 strength with "I's, Y's, and T's" but 4/5 with W's. (from 6/22) DF: 9 reps with B. Eversion/Inversion: 4/5 on R, 4+/5 on L. DF: L 4+/5, R 3+/5  Seated IR/ER on L: 4/5, R 3+/5 (from 8-5) L DF and Eversion 4+/5 with first 1-3 reps and fatigue decreased to 4/4.   Balance/stability:  (6-29) Pt. Able to achieve good posture without cueing but only able to maintain for ~ 2.5 min. Before seeking stability from the table and forward rolled shoulders. With cue to engage the deep core, Pt. Recognizes increased stability and comfort.  8/5: Pt. Demonstrating decreased stability in gait and standing balance today with fasciculations increasing in direct relation to muscular endurance required. Able to stand ~ 10 sec. In SLS with MIN UE support before intensity of spasms make attempt at balance futile.    Pelvic Floor External Exam: (From prior session)- [Introitus Appears: elevated Skin integrity: WNL Palpation: TTP to B STP Cough: limited motion Prolapse visible?: no Scar mobility: not assessed  Internal Vaginal Exam: Strength (PERF): Pt. Has difficulty sensing the PFM to generate contraction or relaxation due to high tone/neuro involvement Symmetry: L>R for spasm/TTP Palpation: TTP to all muscles throughout B Prolapse: none]   Abdominal:  Pt. Demonstrated ability to recruit R>L obliques and TA with pelvic tilts. (from prior session)  Palpation: TTP to L rhomboids and R erector spinae ~ T6-8   Gait Analysis:  INTERVENTIONS THIS SESSION:  Manual: Performed TP release and STM to L rhomboids and R erector spinae ~ T6-8  followed by grade 2-4 PA mobs through T1-T8 to decrease spasm and pain, increase spinal mobility, and allow for improved balance of musculature for improved function and decreased symptoms.  Dry-needle: Performed TPDN with a .30x43m needle and standard approach as described below to decrease spasm and pain and allow for improved balance of musculature for improved function and decreased symptoms.   Therex: Educated on and given handout for sleeper stretch To maintain and improve muscle length and allow for improved balance of musculature for long-term symptom relief.  Self-care: Discussed how anxiety is affecting her sleep and fatigue levels. Discussed potential use of [place] to help with this, encouraged pt. To ask her MD's about safety for her.  Total time: 60 min.                         Trigger Point Dry Needling - 12/23/19 0001    Consent Given? Yes    Education Handout Provided No  Muscles Treated Upper Quadrant Rhomboids    Muscles Treated Back/Hip Erector spinae    Dry Needling Comments R erector spinae ~ T6-8, L Rhomboids    Rhomboids Response Twitch response elicited;Palpable increased muscle length    Erector spinae Response Twitch response elicited;Palpable increased muscle length                  PT Short Term Goals - 10/21/19 1408      PT SHORT TERM GOAL #1   Title Patient will demonstrate coordinated diaphragmatic breathing with pelvic tilts to demonstrate improved control of diaphragm and TA, to allow for further strengthening of core musculature and decreased pelvic floor spasm.    Baseline Pt. demonstrates breathing dysfunction and poor PFM coordination evidenced by anal manometry As of 2/22: Pt. continues to have restriction in her diaphragm and near T/L junction but is able to intentionally use diaphragmatic breathing through the available ROM.    Time 5    Period Weeks    Status Achieved    Target Date 04/20/19      PT SHORT  TERM GOAL #2   Title Patient will report a reduction in pain to no greater than 6/10 over the prior week to demonstrate symptom improvement.    Baseline Pain is 10/10 at worst, 2/10 at best As of 2/22: Pt. had achieved this goal for 1 week as of 1/21 but may have infection or other insult that caused pain to increase again. As of 4/29: Pain high of 8/10 but has been over-doing her activity over the past week. As of 6/10: Pt. was able to keep pain at 6 or below since prior visit by using TENS to help keep tension lower but has not had a full week to test if it will keep working. As of 7/6: Pt. had met this goal but has a new UTI and it caused pain to spike to 8/10 over the weekend.    Time 5    Period Weeks    Status On-going    Target Date 10/23/19      PT SHORT TERM GOAL #3   Title Patient will demonstrate HEP x1 in the clinic to demonstrate understanding and proper form to allow for further improvement.    Baseline Pt. lacks knowledge of therepeutic exercises that can decrease her pain/Sx.    Time 5    Period Weeks    Status Achieved    Target Date 04/20/19      PT SHORT TERM GOAL #4   Title Patient will report consistent use of foot-stool (squatty-potty) for positioning with BM to decrease pain with BM and intra-abdominal pressure.    Baseline Pt. having constipation due to PFM dysfunction    Time 5    Period Weeks    Status Achieved    Target Date 04/20/19             PT Long Term Goals - 10/21/19 1424      PT LONG TERM GOAL #1   Title Pt. will be able to participate in regular ADL's with least restrictive device without pain increasing greater than 2/10    Baseline Pt. limited in her ability to perform household duties by increased pain and fatigue. As of 4/29: Her legs have more endurance with walking before she gets a tremor. Still needs to sit down the majority of the time she is cooking. Still needs back support for prolonged sitting. As of 6/10: Pt. able to do for  ~1  hour  before she needs to rest to prevent increased pain, pain is averaging ~ 4-5/10    Time 10    Period Weeks    Status On-going    Target Date 12/30/19      PT LONG TERM GOAL #2   Title Patient will score at or below 65/300  on the PFDI and 35% on the Female NIH-CPSI to demonstrate a clinically meaningful decrease in disability and distress due to pelvic floor dysfunction.    Baseline PFDI: 110/300, Female NIH-CPSI: 29/43 (67%) As of 4/29: 100/300 on PFDI and 30/43 on NIH-CPSI As of 7/6:  NIH-CPSI: 32/43 POPDI: 61/100, CRADI-8: 29/100, UDI: 44/100 (PFDI: 149/300)    Time 10    Period Weeks    Status On-going    Target Date 12/30/19      PT LONG TERM GOAL #3   Title Patient will report no pain with intercourse to demonstrate improved functional ability.    Baseline Pt. Having significant pain with intercourse. As of 4/29: is a little better (20-30%) but is worse on days where overall tension/pain is worse. As of 6/10: can have days where there is not much pain but other days where there is significant pain.    Time 10    Period Weeks    Status On-going      PT LONG TERM GOAL #4   Title Pt will report ability to work in her yard for greater than 30 minutes without extreme fatigue    Baseline limited to 10-15 minutes before pt requires ending activity. As of 4/29: ~ 20 min. As of 6/10: has not been gardening much but can do ~ 30 min of similar indoor activity level before increased fatigue.    Time 10    Period Weeks    Status Achieved    Target Date 10/23/19      PT LONG TERM GOAL #5   Title Pt. will be able to go 2-3 hours between emptying her bladder without increased pain and empty her bladder fully whether by urostomy, cath, or voluntary release.    Baseline Pt. has a urostomy but continues to have to self-catheterize. Has pain with bladder filling and when using catheter. As of 4/29: is doing better since starting to take the pregabalin andd baclofen but occasionally still has pain  with bladder filling. It is bad when she has an infection or over-does activity, better on "normal" days which have been few and far between due to frequent UTI's. As of 6/10: Pt. has "good" and "bad" days but has more days where she can go 2-3 hours without increased pain    Time 10    Period Weeks    Status On-going    Target Date 12/30/19      PT LONG TERM GOAL #6   Title Patient will report having BM's at least every-other day with consistency between [facility] stool scale 3-5 over the prior week to demonstrate decreased constipation.    Baseline Pt. unable to have regula BM's without medication, manometry shows PFM dysfunction. As of 4/29: still having to use and enema or supository occasionally and still needing linzess. As of 6/10: Pt. has days where she can empty ok and days where she needs to use an Enema to stimulate a BM, feels that it is just the insertion of the enema, not the solution that does the job.    Time 10    Period Weeks    Status On-going  Target Date 12/30/19                 Plan - 12/23/19 1210    Clinical Impression Statement Pt. Responded well to all interventions today, demonstrating improved mobility and decreased spasm as well as understanding and correct performance of all education and exercises provided today. They will continue to benefit from skilled physical therapy to work toward remaining goals and maximize function as well as decrease likelihood of symptom increase or recurrence.    PT Next Visit Plan DIscuss building endurance back up. ask about UTI Sx, if using uqora yet, neural glides on LLE, re-assess for TTP, try TENS near coccyx, focus on graded activity, and seated and standing balance/core strength. review I's Y's and T's in prone, thoracic mobility? perform further release and PNF for R>L shoulder blades, more scar-release and cupping at suprapubic scar, MFR around B ilium. TPDN to multifidus through T-L  junction with E-stim at _0  and at 2  hz. continue to work on thoracolumbar rotation ROM and decreasing spasms, add SLS balance training, begin strengthening for reciprocal inhibition.    PT Home Exercise Plan A day: low back stretch, side-stretch,  hamstring stretch, butterfly stretch, hip EXT in prone with knee bent, side-lying hip ABD, calf raises.  B day: Chest-stretch, Thoracic extensions,  I's, Y's, T's, and W's, self internal TP release, child's pose stretch, DF and Eversion at the ankle. Every day: Pain diary, Posterior pelvic tilts with deep core activation and red band for obliques activation knee fall-out, bow-and-arrow, TENS unit 3+ times per day for 30 min and as needed if pain starts to increase. Sleeper stretch PRN    Consulted and Agree with Plan of Care Patient           Patient will benefit from skilled therapeutic intervention in order to improve the following deficits and impairments:     Visit Diagnosis: Other muscle spasm  Muscle weakness (generalized)  Difficulty in walking, not elsewhere classified     Problem List Patient Active Problem List   Diagnosis Date Noted  . Seizure ([place]) 11/11/2018  . Major depressive disorder, recurrent episode, moderate ([place]) 02/07/2018  . Nephrolithiasis 04/16/2016  . Numbness 07/28/2015  . Bladder retention 06/23/2015  . Abdominal pain 06/04/2015  . Dizziness 05/18/2015  . Neck pain 05/18/2015  . Complicated migraine 25/49/8264  . Other fatigue 04/28/2015  . Abnormal finding on MRI of brain 04/28/2015  . D (diarrhea) 03/29/2015  . H/O disease 03/29/2015  . Abnormal weight loss 03/29/2015  . Muscle weakness (generalized) 03/14/2015  . Headache, migraine 03/10/2015   Willa Rough DPT, ATC Willa Rough 12/23/2019, 12:10 PM  [place] MAIN [facility] SERVICES [address] [place], [place], [address] Phone: [phone]   Fax:  [phone]  Name: Christine Chavez MRN: [id number] Date of Birth: 1980/07/18

## 2019-12-23 NOTE — Patient Instructions (Addendum)
  Start with [place] and decide if you want to add any of the others.  Other brands I have tried are GOLI (lowest dose) or Hello Bello (medium dosage gummies)   Hold for 5 deep breaths and repeat 2-3 times on each side.

## 2019-12-25 ENCOUNTER — Ambulatory Visit: Payer: BC Managed Care – PPO

## 2019-12-25 ENCOUNTER — Other Ambulatory Visit: Payer: Self-pay

## 2019-12-25 DIAGNOSIS — M62838 Other muscle spasm: Secondary | ICD-10-CM

## 2019-12-25 DIAGNOSIS — R262 Difficulty in walking, not elsewhere classified: Secondary | ICD-10-CM

## 2019-12-25 DIAGNOSIS — M6281 Muscle weakness (generalized): Secondary | ICD-10-CM

## 2019-12-25 NOTE — Therapy (Signed)
[place] MAIN [facility] SERVICES [address] [place], [place], [address] Phone: [phone]   Fax:  [phone]  Physical Therapy Treatment  The patient has been informed of current processes in place at Outpatient Rehab to protect patients from Covid-19 exposure including social distancing, schedule modifications, and new cleaning procedures. After discussing their particular risk with a therapist based on the patient's personal risk factors, the patient has decided to proceed with in-person therapy.  Patient Details  Name: Christine Chavez MRN: [id number] Date of Birth: 19-May-1980 Referring Provider (PT): [name]   Encounter Date: 12/25/2019   PT End of Session - 12/25/19 5573    Visit Number 57    Number of Visits 104    Date for PT Re-Evaluation 12/30/19    Authorization Type [employer]    Authorization Time Period from 10/22/2019 through 12/30/2019    Authorization - Visit Number 10    Authorization - Number of Visits 20    Progress Note Due on Visit 58    PT Start Time 0830    PT Stop Time 0930    PT Time Calculation (min) 60 min    Activity Tolerance Patient tolerated treatment well;No increased pain    Behavior During Therapy WFL for tasks assessed/performed           Past Medical History:  Diagnosis Date  . Complication of anesthesia    ? seizures after anesthesia   . Headache   . Migraines   . Neurogenic bladder   . Renal disorder   . Vision abnormalities     Past Surgical History:  Procedure Laterality Date  . ANTERIOR CRUCIATE LIGAMENT REPAIR  1997  . APPENDECTOMY    . COLONOSCOPY WITH PROPOFOL N/A 11/11/2018   Procedure: COLONOSCOPY WITH PROPOFOL;  Surgeon: [name], MD;  Location: [facility] ENDOSCOPY;  Service: Endoscopy;  Laterality: N/A;  . CYSTOSCOPY WITH STENT PLACEMENT Right 04/17/2016   Procedure: CYSTOSCOPY WITH STENT PLACEMENT;  Surgeon: [name], MD;  Location: [facility] ORS;  Service:  Urology;  Laterality: Right;  . ESOPHAGOGASTRODUODENOSCOPY (EGD) WITH PROPOFOL N/A 11/11/2018   Procedure: ESOPHAGOGASTRODUODENOSCOPY (EGD) WITH PROPOFOL;  Surgeon: [name], MD;  Location: [facility] ENDOSCOPY;  Service: Endoscopy;  Laterality: N/A;  . EXPLORATORY LAPAROTOMY  1999  . KIDNEY STONE SURGERY  04/2016  . REVISION UROSTOMY CUTANEOUS    . REVISION UROSTOMY CUTANEOUS  01/10/2018  . SUPRAPUBIC CATHETER PLACEMENT  08/2017  . TONSILLECTOMY      There were no vitals filed for this visit.   Pelvic Floor Physical Therapy Treatment Note  SCREENING  Changes in medications, allergies, or medical history?: Is scheduled for a urodynamics test Sept 22nd and nerve ablation on the 27th.  SUBJECTIVE  Patient reports: Had really bad spasms from the time her feet hit the floor yesterday and all day. When she empties her bladder she still has the painful sensation like when she has a UTI. Still having some of the symptoms today. Tried doing some stretches yesterday and tried using the posturific brace some. She saw her counselor yesterday and they wondered if it may have something to do with her change in medications. Didn't have any pregabalin Tuesday night or Wednesday morning, not sure if that could have contributed. The upper/middle back is a little beter but the flank pain is still bad. Having a hard time even doing laundry. Was proud of herself for putting chicken in the crockpot yesterday.  Precautions:  Neurological diagnosis  pending, has "non-specific" brain lesions   Pain update:  Location of pain: B Flank pain (pelvic area) Current pain:  5/10 (6/10) Max pain:  7/10 (8/10 when it spasms) Least pain:  5/10 (5/10) Nature of pain: sharp to dull ache (grabbing)  *Following treatment Pt. Reports soreness from manual treatment/TPDN  Patient Goals: Get rid of the discomfort with catheterizing and ache/bladder pain. Be able to take fewer medications and be able to enjoy spending  time with her family again, doing "all the mom things"    OBJECTIVE  Changes in:  Observations/Posture:  L lumbar curve prominent with transverse processes more prominent on the L. (from previous note) Forward head and shoulders, appears fatigued.  Range of Motion/Flexibilty:  (from 12/10) Spine: ~ 2-3 fingers from knee with SB B. ~ 50% reduced R rotation with stretch in L, ~ 75% reduced L rotation with increased tightness. HS: able to achieve 90 deg. Hip flexion in seated HS stretch. (from prior treatment)  TODAY: decreased hip ER/ABD in "butterfly" stretch with increased spasticity  Strength/MMT:  LE MMT (from 12-10) LE MMT Left Right  Hip flex:  (L2) /5 /5  Hip ext: 3+/5 3+/5  Hip abd: 4/5 4/5  Hip add: 4/5 3+/5  Hip IR 4/5 3+/5  Hip ER 4/5 4/5   (From 3/24): Pt. Demonstrates 3/5 strength with "I's, Y's, and T's" but 4/5 with W's. (from 6/22) DF: 9 reps with B. Eversion/Inversion: 4/5 on R, 4+/5 on L. DF: L 4+/5, R 3+/5  Seated IR/ER on L: 4/5, R 3+/5 (from 8-5) L DF and Eversion 4+/5 with first 1-3 reps and fatigue decreased to 4/4. (9/9) Pt. Demonstrates ~ 4+/5 B with increased spasticity B.   Balance/stability:  (6-29) Pt. Able to achieve good posture without cueing but only able to maintain for ~ 2.5 min. Before seeking stability from the table and forward rolled shoulders. With cue to engage the deep core, Pt. Recognizes increased stability and comfort.  8/5: Pt. Demonstrating decreased stability in gait and standing balance today with fasciculations increasing in direct relation to muscular endurance required. Able to stand ~ 10 sec. In SLS with MIN UE support before intensity of spasms make attempt at balance futile.    Pelvic Floor External Exam: (From prior session)- [Introitus Appears: elevated Skin integrity: WNL Palpation: TTP to B STP Cough: limited motion Prolapse visible?: no Scar mobility: not assessed  Internal Vaginal Exam: Strength (PERF): Pt.  Has difficulty sensing the PFM to generate contraction or relaxation due to high tone/neuro involvement Symmetry: L>R for spasm/TTP Palpation: TTP to all muscles throughout B Prolapse: none]   Abdominal:  Pt. Demonstrated ability to recruit R>L obliques and TA with pelvic tilts. (from prior session)  Palpation: TTP to L rhomboids and R erector spinae ~ T6-8   Gait Analysis:  INTERVENTIONS THIS SESSION:  Therex: Reviewed entire HEP and exchanged I's, Y's, T's and W's with seated rows and scapular retractions with a band and D/Ced ankle DF and Eversion and knee fallouts for the short-term due to Pt. Endurance/strength following Covid-19 to allow Pt. To continue to slowly rebuild and improve upon endurance and deep-core strength to allow for PFM and bladder spasm reduction.   Self-care: Discussed importance of doing pain diary especially in the aftermath of Covid-19. Re-stated that she should ask her MD about [place] and at least get an at-home UTI test and tell her MD about her UTI symptoms.    Total time: 60 min.  PT Short Term Goals - 10/21/19 1408      PT SHORT TERM GOAL #1   Title Patient will demonstrate coordinated diaphragmatic breathing with pelvic tilts to demonstrate improved control of diaphragm and TA, to allow for further strengthening of core musculature and decreased pelvic floor spasm.    Baseline Pt. demonstrates breathing dysfunction and poor PFM coordination evidenced by anal manometry As of 2/22: Pt. continues to have restriction in her diaphragm and near T/L junction but is able to intentionally use diaphragmatic breathing through the available ROM.    Time 5    Period Weeks    Status Achieved    Target Date 04/20/19      PT SHORT TERM GOAL #2   Title Patient will report a reduction in pain to no greater than 6/10 over the prior week to demonstrate symptom improvement.    Baseline Pain is 10/10 at worst,  2/10 at best As of 2/22: Pt. had achieved this goal for 1 week as of 1/21 but may have infection or other insult that caused pain to increase again. As of 4/29: Pain high of 8/10 but has been over-doing her activity over the past week. As of 6/10: Pt. was able to keep pain at 6 or below since prior visit by using TENS to help keep tension lower but has not had a full week to test if it will keep working. As of 7/6: Pt. had met this goal but has a new UTI and it caused pain to spike to 8/10 over the weekend.    Time 5    Period Weeks    Status On-going    Target Date 10/23/19      PT SHORT TERM GOAL #3   Title Patient will demonstrate HEP x1 in the clinic to demonstrate understanding and proper form to allow for further improvement.    Baseline Pt. lacks knowledge of therepeutic exercises that can decrease her pain/Sx.    Time 5    Period Weeks    Status Achieved    Target Date 04/20/19      PT SHORT TERM GOAL #4   Title Patient will report consistent use of foot-stool (squatty-potty) for positioning with BM to decrease pain with BM and intra-abdominal pressure.    Baseline Pt. having constipation due to PFM dysfunction    Time 5    Period Weeks    Status Achieved    Target Date 04/20/19             PT Long Term Goals - 10/21/19 1424      PT LONG TERM GOAL #1   Title Pt. will be able to participate in regular ADL's with least restrictive device without pain increasing greater than 2/10    Baseline Pt. limited in her ability to perform household duties by increased pain and fatigue. As of 4/29: Her legs have more endurance with walking before she gets a tremor. Still needs to sit down the majority of the time she is cooking. Still needs back support for prolonged sitting. As of 6/10: Pt. able to do for  ~1 hour before she needs to rest to prevent increased pain, pain is averaging ~ 4-5/10    Time 10    Period Weeks    Status On-going    Target Date 12/30/19      PT LONG TERM GOAL  #2   Title Patient will score at or below 65/300  on the PFDI and 35% on the Female  NIH-CPSI to demonstrate a clinically meaningful decrease in disability and distress due to pelvic floor dysfunction.    Baseline PFDI: 110/300, Female NIH-CPSI: 29/43 (67%) As of 4/29: 100/300 on PFDI and 30/43 on NIH-CPSI As of 7/6:  NIH-CPSI: 32/43 POPDI: 61/100, CRADI-8: 29/100, UDI: 44/100 (PFDI: 149/300)    Time 10    Period Weeks    Status On-going    Target Date 12/30/19      PT LONG TERM GOAL #3   Title Patient will report no pain with intercourse to demonstrate improved functional ability.    Baseline Pt. Having significant pain with intercourse. As of 4/29: is a little better (20-30%) but is worse on days where overall tension/pain is worse. As of 6/10: can have days where there is not much pain but other days where there is significant pain.    Time 10    Period Weeks    Status On-going      PT LONG TERM GOAL #4   Title Pt will report ability to work in her yard for greater than 30 minutes without extreme fatigue    Baseline limited to 10-15 minutes before pt requires ending activity. As of 4/29: ~ 20 min. As of 6/10: has not been gardening much but can do ~ 30 min of similar indoor activity level before increased fatigue.    Time 10    Period Weeks    Status Achieved    Target Date 10/23/19      PT LONG TERM GOAL #5   Title Pt. will be able to go 2-3 hours between emptying her bladder without increased pain and empty her bladder fully whether by urostomy, cath, or voluntary release.    Baseline Pt. has a urostomy but continues to have to self-catheterize. Has pain with bladder filling and when using catheter. As of 4/29: is doing better since starting to take the pregabalin andd baclofen but occasionally still has pain with bladder filling. It is bad when she has an infection or over-does activity, better on "normal" days which have been few and far between due to frequent UTI's. As of 6/10: Pt.  has "good" and "bad" days but has more days where she can go 2-3 hours without increased pain    Time 10    Period Weeks    Status On-going    Target Date 12/30/19      PT LONG TERM GOAL #6   Title Patient will report having BM's at least every-other day with consistency between [facility] stool scale 3-5 over the prior week to demonstrate decreased constipation.    Baseline Pt. unable to have regula BM's without medication, manometry shows PFM dysfunction. As of 4/29: still having to use and enema or supository occasionally and still needing linzess. As of 6/10: Pt. has days where she can empty ok and days where she needs to use an Enema to stimulate a BM, feels that it is just the insertion of the enema, not the solution that does the job.    Time 10    Period Weeks    Status On-going    Target Date 12/30/19                 Plan - 12/25/19 1241    Clinical Impression Statement Pt. Responded well to all interventions today, demonstrating improved performance of HEP as well as understanding and correct performance of all education and exercises provided today. They will continue to benefit from skilled physical therapy to work  toward remaining goals and maximize function as well as decrease likelihood of symptom increase or recurrence.    PT Next Visit Plan TPDN to B rectus abdominus, Review pain diary, cupping to medial thighs and upper back, ask about UTI Sx, if using uqora yet, neural glides on LLE, re-assess for TTP, try TENS near coccyx, focus on graded activity, and seated and standing balance/core strength. review I's Y's and T's in prone, thoracic mobility? perform further release and PNF for R>L shoulder blades, more scar-release and cupping at suprapubic scar, MFR around B ilium. TPDN to multifidus through T-L  junction with E-stim at $[id number]$  and at 2 hz. continue to work on thoracolumbar rotation ROM and decreasing spasms, add SLS balance training, begin strengthening for reciprocal  inhibition.    PT Home Exercise Plan A day: low back stretch, side-stretch, hamstring stretch, butterfly stretch, hip EXT in prone with knee bent, side-lying hip ABD, calf raises. B day: Chest-stretch, Thoracic extensions over a towel roll, seated rows and seated scapular retraction (pull shoulder blades together like "T's") with band, child's pose stretch, self internal TP release (especially when Pelvic floor spasms high), Every day: Pain diary, Posterior pelvic tilts with deep core activation and red band for obliques activation, bow-and-arrow, TENS unit 3+ times per day for 30 min and as needed if pain starts to increase. Sleeper stretch PRN    Consulted and Agree with Plan of Care Patient           Patient will benefit from skilled therapeutic intervention in order to improve the following deficits and impairments:     Visit Diagnosis: Other muscle spasm  Muscle weakness (generalized)  Difficulty in walking, not elsewhere classified     Problem List Patient Active Problem List   Diagnosis Date Noted  . Seizure ([place]) 11/11/2018  . Major depressive disorder, recurrent episode, moderate ([place]) 02/07/2018  . Nephrolithiasis 04/16/2016  . Numbness 07/28/2015  . Bladder retention 06/23/2015  . Abdominal pain 06/04/2015  . Dizziness 05/18/2015  . Neck pain 05/18/2015  . Complicated migraine 02/05/1172  . Other fatigue 04/28/2015  . Abnormal finding on MRI of brain 04/28/2015  . D (diarrhea) 03/29/2015  . H/O disease 03/29/2015  . Abnormal weight loss 03/29/2015  . Muscle weakness (generalized) 03/14/2015  . Headache, migraine 03/10/2015   Willa Rough DPT, ATC Willa Rough 12/25/2019, 12:49 PM  [place] MAIN [facility] SERVICES [address] [place], [place], [address] Phone: [phone]   Fax:  [phone]  Name: Christine Chavez MRN: [id number] Date of Birth: 1980/10/01

## 2019-12-25 NOTE — Patient Instructions (Addendum)
   Ask your doctor about the safety of [place] and Uqora supplements/products for you, get a home UTI test and get a cooling neck wrap   A day: low back stretch, side-stretch, hamstring stretch, butterfly stretch, hip EXT in prone with knee bent, side-lying hip ABD, calf raises.  B day: Chest-stretch, Thoracic extensions over a towel roll, seated rows and seated scapular retraction (pull shoulder blades together like "T's") with band, child's pose stretch, self internal TP release (especially when Pelvic floor spasms high),  Every day: Pain diary, Posterior pelvic tilts with deep core activation and red band for obliques activation, bow-and-arrow, TENS unit 3+ times per day for 30 min and as needed if pain starts to increase. Sleeper stretch PRN

## 2019-12-30 ENCOUNTER — Ambulatory Visit: Payer: BC Managed Care – PPO

## 2020-01-01 ENCOUNTER — Ambulatory Visit: Payer: BC Managed Care – PPO

## 2020-01-06 ENCOUNTER — Ambulatory Visit: Payer: BC Managed Care – PPO

## 2020-01-06 ENCOUNTER — Other Ambulatory Visit: Payer: Self-pay

## 2020-01-06 DIAGNOSIS — M62838 Other muscle spasm: Secondary | ICD-10-CM

## 2020-01-06 DIAGNOSIS — M6281 Muscle weakness (generalized): Secondary | ICD-10-CM

## 2020-01-06 DIAGNOSIS — R262 Difficulty in walking, not elsewhere classified: Secondary | ICD-10-CM

## 2020-01-06 NOTE — Patient Instructions (Signed)
  [email or web address] [place] found at [employer]    Use at a strong tingle setting for 1 hour per day.  Progressive relaxation technique:  Start by tensing all the muscles in your right foot, then calf, then upper leg, glute, low back and hold for 2 deep breaths, release the tension and let the limb melt into the table. Next start with the left leg and do the same progression, foot, lower leg, upper leg, glute, low back, then release. Then the Right hand make a fist, tense the forearm, the upper arm, shoulder, hold for two breaths, and release letting it melt into the ground, repeat on the left. Finally, start with both feet, both calves, both upper legs, both glutes, lower abdomen, upper abdomen, make fists, tighten forearms, upper arms, shoulders, face. Hold for two breaths and then completely melt into the floor.

## 2020-01-06 NOTE — Therapy (Addendum)
[place] MAIN [facility] SERVICES [address] [place], [place], [address] Phone: [phone]   Fax:  [phone]  Physical Therapy Treatment  The patient has been informed of current processes in place at Outpatient Rehab to protect patients from Covid-19 exposure including social distancing, schedule modifications, and new cleaning procedures. After discussing their particular risk with a therapist based on the patient's personal risk factors, the patient has decided to proceed with in-person therapy.  Patient Details  Name: Christine Chavez MRN: [id number] Date of Birth: [birth year] No data recorded   Encounter Date: 01/06/2020   PT End of Session - 10/14/20 0001     Visit Number 65    Number of Visits 47    Date for PT Re-Evaluation 12/30/19    Authorization Type [employer]    Authorization Time Period from 01/06/20 through 03/02/20    Authorization - Visit Number 1    Authorization - Number of Visits 16    Progress Note Due on Visit 53    PT Start Time 1030    PT Stop Time 1130    PT Time Calculation (min) 60 min    Activity Tolerance Patient tolerated treatment well;No increased pain    Behavior During Therapy WFL for tasks assessed/performed              Past Medical History:  Diagnosis Date   Complication of anesthesia    ? seizures after anesthesia    Headache    Migraines    Neurogenic bladder    Renal disorder    Vision abnormalities     Past Surgical History:  Procedure Laterality Date   ANTERIOR CRUCIATE LIGAMENT REPAIR  1997   APPENDECTOMY     COLONOSCOPY WITH PROPOFOL N/A 11/11/2018   Procedure: COLONOSCOPY WITH PROPOFOL;  Surgeon: [name], MD;  Location: [facility] ENDOSCOPY;  Service: Endoscopy;  Laterality: N/A;   CYSTOSCOPY WITH STENT PLACEMENT Right 04/17/2016   Procedure: CYSTOSCOPY WITH STENT PLACEMENT;  Surgeon: [name], MD;  Location: [facility] ORS;  Service: Urology;  Laterality: Right;    ESOPHAGOGASTRODUODENOSCOPY (EGD) WITH PROPOFOL N/A 11/11/2018   Procedure: ESOPHAGOGASTRODUODENOSCOPY (EGD) WITH PROPOFOL;  Surgeon: [name], MD;  Location: [facility] ENDOSCOPY;  Service: Endoscopy;  Laterality: N/A;   EXPLORATORY LAPAROTOMY  1999   KIDNEY STONE SURGERY  04/2016   REVISION UROSTOMY CUTANEOUS     REVISION UROSTOMY CUTANEOUS  01/10/2018   SUPRAPUBIC CATHETER PLACEMENT  08/2017   TONSILLECTOMY      There were no vitals filed for this visit.  Pelvic Floor Physical Therapy Treatment Note  SCREENING  Changes in medications, allergies, or medical history?: Has her MRI tonight, has a colon infection (C-diff).   SUBJECTIVE  Patient reports: She has used a home UTI test and it was positive. Had an episode where her vision was really bad and she had a headache that was worse when laying down and woke her up, made her throw up. The pain lasted 4-5 hours. Her BP had spiked as well but came back down even while the pain was still high. Bladder pain has been very intense. Her "cup is full and about to spill over" with all of this. She is on a 14 day antibiotic for the C-diff but still is also feeling like her urine smells and she has a UTI. Her urologist does not think she needs to be on another antibiotic for the UTI's. He wants to wait until the urodynamics study. She feels  bloated/abdomen distended. Has fallen off the bandwagon with her exercises when feeling really bad but has tried to do the basics when she can. The diarrhea has been on and off and a little more formed than they were. Has been trying to incorporate a little bit of Probiotic rich foods with each meal.  Precautions:  Neurological diagnosis pending, has "non-specific" brain lesions   Pain update:  Location of pain: B Flank pain (pelvic area) Current pain:  4/10 (6/10) Max pain:  8/10 (10/10 when it spasms) Least pain:  3/10 (3/10) Nature of pain: sharp to dull ache (grabbing)  *no change following  treatment.  Patient Goals: Get rid of the discomfort with catheterizing and ache/bladder pain. Be able to take fewer medications and be able to enjoy spending time with her family again, doing "all the mom things"    OBJECTIVE  Changes in:  Observations/Posture:  L lumbar curve prominent with transverse processes more prominent on the L. (from previous note)  Posture improved in her motorized w/c than noted in other chairs. Knees still adducted.  Range of Motion/Flexibilty:  (from 12/10) Spine: ~ 2-3 fingers from knee with SB B. ~ 50% reduced R rotation with stretch in L, ~ 75% reduced L rotation with increased tightness. HS: able to achieve 90 deg. Hip flexion in seated HS stretch. (from prior treatment)   Strength/MMT:  LE MMT (from 12-10) LE MMT Left Right  Hip flex:  (L2) /5 /5  Hip ext: 3+/5 3+/5  Hip abd: 4/5 4/5  Hip add: 4/5 3+/5  Hip IR 4/5 3+/5  Hip ER 4/5 4/5   (From 3/24): Pt. Demonstrates 3/5 strength with "I's, Y's, and T's" but 4/5 with W's. (from 6/22) DF: 9 reps with B. Eversion/Inversion: 4/5 on R, 4+/5 on L. DF: L 4+/5, R 3+/5  Seated IR/ER on L: 4/5, R 3+/5 (from 8-5) L DF and Eversion 4+/5 with first 1-3 reps and fatigue decreased to 4/4. (9/9) Pt. Demonstrates ~ 4+/5 B with increased spasticity B.   Balance/stability:  (6-29) Pt. Able to achieve good posture without cueing but only able to maintain for ~ 2.5 min. Before seeking stability from the table and forward rolled shoulders. With cue to engage the deep core, Pt. Recognizes increased stability and comfort.  8/5: Pt. Demonstrating decreased stability in gait and standing balance today with fasciculations increasing in direct relation to muscular endurance required. Able to stand ~ 10 sec. In SLS with MIN UE support before intensity of spasms make attempt at balance futile.    Pelvic Floor External Exam: (From prior session)- [Introitus Appears: elevated Skin integrity: WNL Palpation: TTP to B  STP Cough: limited motion Prolapse visible?: no Scar mobility: not assessed  Internal Vaginal Exam: Strength (PERF): Pt. Has difficulty sensing the PFM to generate contraction or relaxation due to high tone/neuro involvement Symmetry: L>R for spasm/TTP Palpation: TTP to all muscles throughout B Prolapse: none]   Abdominal:  Pt. Demonstrated ability to recruit R>L obliques and TA with pelvic tilts. (from prior session)  Palpation:  Gait Analysis:  INTERVENTIONS THIS SESSION:  Self-care: Discussed recent changes in medical condition, including c-diff diagnosis and "neuro event" with migraine and visual changes. Further discussed ways to address her gut and urinary tract health by addressing stress through progressive relaxation technique, breathing techniques, and ashwagandah. Reviewed what her MD's expressed about "no harm" of using Uqora and recommended that Pt. Begin this therapy along with increasing her pre-biotic rich foods to help restore her gut  flora.   Theract: educated Pt. On how to set up ad run E-stim for down-regulation of the sacral nerve to attempt to decrease bladder and PFM tone. Started with strong-tingle and ipsilateral channel setup daily.     Total time: 60 min.                              PT Short Term Goals - 10/14/20 0001       PT SHORT TERM GOAL #1   Title Patient will demonstrate coordinated diaphragmatic breathing with pelvic tilts to demonstrate improved control of diaphragm and TA, to allow for further strengthening of core musculature and decreased pelvic floor spasm.    Baseline Pt. demonstrates breathing dysfunction and poor PFM coordination evidenced by anal manometry As of 2/22: Pt. continues to have restriction in her diaphragm and near T/L junction but is able to intentionally use diaphragmatic breathing through the available ROM.    Time 5    Period Weeks    Status Achieved    Target Date 04/20/19      PT SHORT TERM  GOAL #2   Title Patient will report a reduction in pain to no greater than 6/10 over the prior week to demonstrate symptom improvement.    Baseline Pain is 10/10 at worst, 2/10 at best As of 2/22: Pt. had achieved this goal for 1 week as of 1/21 but may have infection or other insult that caused pain to increase again. As of 4/29: Pain high of 8/10 but has been over-doing her activity over the past week. As of 6/10: Pt. was able to keep pain at 6 or below since prior visit by using TENS to help keep tension lower but has not had a full week to test if it will keep working. As of 7/6: Pt. had met this goal but has a new UTI and it caused pain to spike to 8/10 over the weekend.    Time 5    Period Weeks    Status On-going    Target Date 10/23/19      PT SHORT TERM GOAL #3   Title Patient will demonstrate HEP x1 in the clinic to demonstrate understanding and proper form to allow for further improvement.    Baseline Pt. lacks knowledge of therepeutic exercises that can decrease her pain/Sx.    Time 5    Period Weeks    Status Achieved    Target Date 04/20/19      PT SHORT TERM GOAL #4   Title Patient will report consistent use of foot-stool (squatty-potty) for positioning with BM to decrease pain with BM and intra-abdominal pressure.    Baseline Pt. having constipation due to PFM dysfunction    Time 5    Period Weeks    Status Achieved    Target Date 04/20/19               PT Long Term Goals - 10/14/20 0001       PT LONG TERM GOAL #1   Title Pt. will be able to participate in regular ADL's with least restrictive device without pain increasing greater than 2/10    Baseline Pt. limited in her ability to perform household duties by increased pain and fatigue. As of 4/29: Her legs have more endurance with walking before she gets a tremor. Still needs to sit down the majority of the time she is cooking. Still needs back support for prolonged  sitting. As of 6/10: Pt. able to do for  ~1 hour  before she needs to rest to prevent increased pain, pain is averaging ~ 4-5/10    Time 8    Period Weeks    Status On-going    Target Date 03/02/20      PT LONG TERM GOAL #2   Title Patient will score at or below 65/300  on the PFDI and 35% on the Female NIH-CPSI to demonstrate a clinically meaningful decrease in disability and distress due to pelvic floor dysfunction.    Baseline PFDI: 110/300, Female NIH-CPSI: 29/43 (67%) As of 4/29: 100/300 on PFDI and 30/43 on NIH-CPSI As of 7/6:  NIH-CPSI: 32/43 POPDI: 61/100, CRADI-8: 29/100, UDI: 44/100 (PFDI: 149/300)    Time 8    Period Weeks    Status On-going    Target Date 03/02/20      PT LONG TERM GOAL #3   Title Patient will report no pain with intercourse to demonstrate improved functional ability.    Baseline Pt. Having significant pain with intercourse. As of 4/29: is a little better (20-30%) but is worse on days where overall tension/pain is worse. As of 6/10: can have days where there is not much pain but other days where there is significant pain.    Time 10    Period Weeks    Status On-going    Target Date 03/02/20      PT LONG TERM GOAL #4   Title Pt will report ability to work in her yard for greater than 30 minutes without extreme fatigue    Baseline limited to 10-15 minutes before pt requires ending activity. As of 4/29: ~ 20 min. As of 6/10: has not been gardening much but can do ~ 30 min of similar indoor activity level before increased fatigue.    Time 10    Period Weeks    Status Achieved    Target Date 12/30/19      PT LONG TERM GOAL #5   Title Pt. will be able to go 2-3 hours between emptying her bladder without increased pain and empty her bladder fully whether by urostomy, cath, or voluntary release.    Baseline Pt. has a urostomy but continues to have to self-catheterize. Has pain with bladder filling and when using catheter. As of 4/29: is doing better since starting to take the pregabalin andd baclofen but  occasionally still has pain with bladder filling. It is bad when she has an infection or over-does activity, better on "normal" days which have been few and far between due to frequent UTI's. As of 6/10: Pt. has "good" and "bad" days but has more days where she can go 2-3 hours without increased pain    Time 10    Period Weeks    Status On-going    Target Date 03/02/20      PT LONG TERM GOAL #6   Title Patient will report having BM's at least every-other day with consistency between [facility] stool scale 3-5 over the prior week to demonstrate decreased constipation.    Baseline Pt. unable to have regula BM's without medication, manometry shows PFM dysfunction. As of 4/29: still having to use and enema or supository occasionally and still needing linzess. As of 6/10: Pt. has days where she can empty ok and days where she needs to use an Enema to stimulate a BM, feels that it is just the insertion of the enema, not the solution that does the job.  Time 10    Period Weeks    Status On-going    Target Date 03/02/20                 Plan - 10/14/20 0001     Clinical Impression Statement Pt. Has had continued elevated Sx. Due to colonic infection on top of post-covid, neurological Sx, and possible UTI, She is very stressed and frustrated with the current situation and seeming lack of response from her medical team. Her emotional state is dangerously precarious regardless of taking all the precautions she knows how to take including depression medication. At this stage in her treatment she has learned to know what pain is related to bladder spasm/UTI Vs. Pelvic floor spasm and feels strongly that something is wrong with her bladder that needs to be treated. She responded well to all interventions today, demonstrating improved understanding and correct performance of all education and exercises provided today. We will trial use of E-stim to down-regulate the sacral nerve as pt. is gong to have a  nerve ablation on Monday to address the hypogastric nerve. Hopefully the combination of these interventions will help her achieve some symptom reduction and relief. We have shown on multiple attempts that when the pt. Has a UTI her spasms are only reduced briefly by manual or TPDN interventions. They will continue to benefit from skilled physical therapy to work toward remaining goals and maximize function as well as decrease likelihood of symptom increase or recurrence.    Personal Factors and Comorbidities Comorbidity 3+;Transportation;Time since onset of injury/illness/exacerbation;Finances    Comorbidities neurologic insult with pending diagnosis, multiple recurrent UTI's, BLE weakness, depression    Examination-Activity Limitations Caring for Others;Carry;Continence;Squat;Stairs;Toileting;Locomotion Level;Lift;Stand;Bed Mobility;Bend;Reach Overhead;Sit;Dressing;Transfers    Examination-Participation Restrictions Church;Cleaning;Community Activity;Driving;Interpersonal Relationship;Laundry;[employer] Work;Meal Prep;School;Volunteer    Stability/Clinical Decision Making Unstable/Unpredictable    Clinical Decision Making High    Rehab Potential Fair    PT Frequency 2x / week    PT Duration 8 weeks    PT Treatment/Interventions ADLs/Self Care Home Management;Aquatic Therapy;Biofeedback;Moist Heat;Electrical Stimulation;Cryotherapy;Traction;Ultrasound;Therapeutic activities;Functional mobility training;Stair training;Gait training;Therapeutic exercise;Balance training;Neuromuscular re-education;Patient/family education;Manual techniques;Dry needling;Scar mobilization;Energy conservation;Taping;Joint Manipulations;Spinal Manipulations;Visual/perceptual remediation/compensation;Passive range of motion;Wheelchair mobility training;DME Instruction;Orthotic Fit/Training    PT Next Visit Plan TPDN to B rectus abdominus, Review pain diary, cupping to medial thighs and upper back, ask about UTI Sx, if using uqora yet,  neural glides on LLE, re-assess for TTP, try TENS near coccyx, focus on graded activity, and seated and standing balance/core strength. review I's Y's and T's in prone, thoracic mobility? perform further release and PNF for R>L shoulder blades, more scar-release and cupping at suprapubic scar, MFR around B ilium. TPDN to multifidus through T-L  junction with E-stim at _0  and at 2 hz. continue to work on thoracolumbar rotation ROM and decreasing spasms, add SLS balance training, begin strengthening for reciprocal inhibition.    PT Home Exercise Plan A day: low back stretch, side-stretch, hamstring stretch, butterfly stretch, hip EXT in prone with knee bent, side-lying hip ABD, calf raises. B day: Chest-stretch, Thoracic extensions over a towel roll, seated rows and seated scapular retraction (pull shoulder blades together like "T's") with band, child's pose stretch, self internal TP release (especially when Pelvic floor spasms high), Every day: Pain diary, Posterior pelvic tilts with deep core activation and red band for obliques activation, bow-and-arrow, TENS unit 3+ times per day for 30 min and as needed if pain starts to increase. TENS at ASIS/PSIS at strong tingle for 1 hr. per day. Sleeper  stretch PRN    Consulted and Agree with Plan of Care Patient               Patient will benefit from skilled therapeutic intervention in order to improve the following deficits and impairments:  Abnormal gait, Decreased balance, Decreased endurance, Decreased mobility, Difficulty walking, Increased muscle spasms, Impaired sensation, Improper body mechanics, Impaired tone, Decreased activity tolerance, Decreased coordination, Decreased strength, Postural dysfunction, Pain, Impaired flexibility  Visit Diagnosis: Other muscle spasm  Muscle weakness (generalized)  Difficulty in walking, not elsewhere classified     Problem List Patient Active Problem List   Diagnosis Date Noted   Seizure ([place])  11/11/2018   Major depressive disorder, recurrent episode, moderate ([place]) 02/07/2018   Nephrolithiasis 04/16/2016   Numbness 07/28/2015   Bladder retention 06/23/2015   Abdominal pain 06/04/2015   Dizziness 05/18/2015   Neck pain 93/55/2174   Complicated migraine 71/59/5396   Other fatigue 04/28/2015   Abnormal finding on MRI of brain 04/28/2015   D (diarrhea) 03/29/2015   H/O disease 03/29/2015   Abnormal weight loss 03/29/2015   Muscle weakness (generalized) 03/14/2015   Headache, migraine 03/10/2015   Willa Rough DPT, ATC Willa Rough 10/14/2020, 7:52 AM  [place] MAIN [facility] SERVICES [address] [place], [place], [address] Phone: [phone]   Fax:  [phone]  Name: Christine Chavez MRN: [id number] Date of Birth: November 07, 1980

## 2020-01-08 ENCOUNTER — Ambulatory Visit: Payer: BC Managed Care – PPO

## 2020-01-08 ENCOUNTER — Other Ambulatory Visit: Payer: Self-pay

## 2020-01-08 DIAGNOSIS — R262 Difficulty in walking, not elsewhere classified: Secondary | ICD-10-CM

## 2020-01-08 DIAGNOSIS — M62838 Other muscle spasm: Secondary | ICD-10-CM

## 2020-01-08 DIAGNOSIS — M6281 Muscle weakness (generalized): Secondary | ICD-10-CM

## 2020-01-08 NOTE — Patient Instructions (Signed)
For tingle E-stim: Normal setting-80 rate, 50 width 1 hr per day.  For back/ pulse E-stim: Normal setting, 2 rate, 300 (high) width

## 2020-01-08 NOTE — Therapy (Signed)
[place] MAIN [facility] SERVICES [address] [place], [place], [address] Phone: [phone]   Fax:  [phone]  Physical Therapy Treatment  The patient has been informed of current processes in place at Outpatient Rehab to protect patients from Covid-19 exposure including social distancing, schedule modifications, and new cleaning procedures. After discussing their particular risk with a therapist based on the patient's personal risk factors, the patient has decided to proceed with in-person therapy.  Patient Details  Name: Christine Chavez MRN: [id number] Date of Birth: 1980/08/15 Referring Provider (PT): [name]   Encounter Date: 01/08/2020   PT End of Session - 01/09/20 0908    Visit Number 60    Number of Visits 52    Date for PT Re-Evaluation 12/30/19    Authorization Type [employer]    Authorization Time Period from 10/22/2019 through 12/30/2019    Authorization - Visit Number 12    Authorization - Number of Visits 20    Progress Note Due on Visit 17    PT Start Time 1335    PT Stop Time 1435    PT Time Calculation (min) 60 min    Activity Tolerance Patient tolerated treatment well;No increased pain    Behavior During Therapy WFL for tasks assessed/performed           Past Medical History:  Diagnosis Date  . Complication of anesthesia    ? seizures after anesthesia   . Headache   . Migraines   . Neurogenic bladder   . Renal disorder   . Vision abnormalities     Past Surgical History:  Procedure Laterality Date  . ANTERIOR CRUCIATE LIGAMENT REPAIR  1997  . APPENDECTOMY    . COLONOSCOPY WITH PROPOFOL N/A 11/11/2018   Procedure: COLONOSCOPY WITH PROPOFOL;  Surgeon: [name], MD;  Location: [facility] ENDOSCOPY;  Service: Endoscopy;  Laterality: N/A;  . CYSTOSCOPY WITH STENT PLACEMENT Right 04/17/2016   Procedure: CYSTOSCOPY WITH STENT PLACEMENT;  Surgeon: [name], MD;  Location: [facility] ORS;  Service:  Urology;  Laterality: Right;  . ESOPHAGOGASTRODUODENOSCOPY (EGD) WITH PROPOFOL N/A 11/11/2018   Procedure: ESOPHAGOGASTRODUODENOSCOPY (EGD) WITH PROPOFOL;  Surgeon: [name], MD;  Location: [facility] ENDOSCOPY;  Service: Endoscopy;  Laterality: N/A;  . EXPLORATORY LAPAROTOMY  1999  . KIDNEY STONE SURGERY  04/2016  . REVISION UROSTOMY CUTANEOUS    . REVISION UROSTOMY CUTANEOUS  01/10/2018  . SUPRAPUBIC CATHETER PLACEMENT  08/2017  . TONSILLECTOMY      There were no vitals filed for this visit.  Pelvic Floor Physical Therapy Treatment Note  SCREENING  Changes in medications, allergies, or medical history?: Has her MRI tonight, has a colon infection (C-diff).   SUBJECTIVE  Patient reports: She has been having a lot of bloating feels like her stomach is "ginormous" her back had been uncomfortable. Still having diarrhea, dark stools. She   Precautions:  Neurological diagnosis pending, has "non-specific" brain lesions   Pain update:  Location of pain: B Flank pain (pelvic area) Current pain:  5/10 (5/10) Max pain:  8/10 (8/10 when it spasms) Least pain:  3/10 (3/10) Nature of pain: sharp to dull ache (grabbing)  *decreased TTP and improved ease of breathing following treatment.  Patient Goals: Get rid of the discomfort with catheterizing and ache/bladder pain. Be able to take fewer medications and be able to enjoy spending time with her family again, doing "all the mom things"    OBJECTIVE  Changes in:  Observations/Posture:  L lumbar curve prominent with transverse processes more prominent on the L. (from previous note)  Posture improved in her motorized w/c than noted in other chairs. Knees still adducted.  Range of Motion/Flexibilty:  (from 12/10) Spine: ~ 2-3 fingers from knee with SB B. ~ 50% reduced R rotation with stretch in L, ~ 75% reduced L rotation with increased tightness. HS: able to achieve 90 deg. Hip flexion in seated HS stretch. (from prior  treatment)   Strength/MMT:  LE MMT (from 12-10) LE MMT Left Right  Hip flex:  (L2) /5 /5  Hip ext: 3+/5 3+/5  Hip abd: 4/5 4/5  Hip add: 4/5 3+/5  Hip IR 4/5 3+/5  Hip ER 4/5 4/5   (From 3/24): Pt. Demonstrates 3/5 strength with "I's, Y's, and T's" but 4/5 with W's. (from 6/22) DF: 9 reps with B. Eversion/Inversion: 4/5 on R, 4+/5 on L. DF: L 4+/5, R 3+/5  Seated IR/ER on L: 4/5, R 3+/5 (from 8-5) L DF and Eversion 4+/5 with first 1-3 reps and fatigue decreased to 4/4. (9/9) Pt. Demonstrates ~ 4+/5 B with increased spasticity B.   Balance/stability:  (6-29) Pt. Able to achieve good posture without cueing but only able to maintain for ~ 2.5 min. Before seeking stability from the table and forward rolled shoulders. With cue to engage the deep core, Pt. Recognizes increased stability and comfort.  8/5: Pt. Demonstrating decreased stability in gait and standing balance today with fasciculations increasing in direct relation to muscular endurance required. Able to stand ~ 10 sec. In SLS with MIN UE support before intensity of spasms make attempt at balance futile.    Pelvic Floor External Exam: (From prior session)- [Introitus Appears: elevated Skin integrity: WNL Palpation: TTP to B STP Cough: limited motion Prolapse visible?: no Scar mobility: not assessed  Internal Vaginal Exam: Strength (PERF): Pt. Has difficulty sensing the PFM to generate contraction or relaxation due to high tone/neuro involvement Symmetry: L>R for spasm/TTP Palpation: TTP to all muscles throughout B Prolapse: none]   Abdominal:  Pt. Demonstrated ability to recruit R>L obliques and TA with pelvic tilts. (from prior session)  Palpation:  Gait Analysis:  INTERVENTIONS THIS SESSION:  Manual: Performed TP release and STM to B rectus abdominus to decrease spasm and pain and allow for improved balance of musculature for improved function and decreased symptoms.  Dry-needle: Performed TPDN to B rectus  abdominus near origin to decrease spasm and pain and allow for improved balance of musculature, improved ease of breathing, for improved function and decreased symptoms.  E-stim: Reviewed On how to set up ad run E-stim for down-regulation of the sacral nerve and what to look for to see if it is having the desired effect. Practiced pad-placement and re-wrote out her 2 different setting parameters as Pt. Had lost her original handout.Ran E-stim for 15 min. In clinic to verify correct response. Started with strong-tingle and ipsilateral channel setup daily for 1 hr. Pt. Tolerates intensity between 3-4 and able to increase due to accommodation.  Self-care: Educated on elevating her feet, doing belly breathing and ankle punps to help decrease swelling in the abdomen.  Total time: 60 min.                      Trigger Point Dry Needling - 01/09/20 0001    Consent Given? Yes    Education Handout Provided No    Muscles Treated Back/Hip Rectus abdominis    Dry Needling Comments B above  umbilicus    Rectus abdominis Response Twitch response elicited;Palpable increased muscle length                  PT Short Term Goals - 10/21/19 1408      PT SHORT TERM GOAL #1   Title Patient will demonstrate coordinated diaphragmatic breathing with pelvic tilts to demonstrate improved control of diaphragm and TA, to allow for further strengthening of core musculature and decreased pelvic floor spasm.    Baseline Pt. demonstrates breathing dysfunction and poor PFM coordination evidenced by anal manometry As of 2/22: Pt. continues to have restriction in her diaphragm and near T/L junction but is able to intentionally use diaphragmatic breathing through the available ROM.    Time 5    Period Weeks    Status Achieved    Target Date 04/20/19      PT SHORT TERM GOAL #2   Title Patient will report a reduction in pain to no greater than 6/10 over the prior week to demonstrate symptom improvement.     Baseline Pain is 10/10 at worst, 2/10 at best As of 2/22: Pt. had achieved this goal for 1 week as of 1/21 but may have infection or other insult that caused pain to increase again. As of 4/29: Pain high of 8/10 but has been over-doing her activity over the past week. As of 6/10: Pt. was able to keep pain at 6 or below since prior visit by using TENS to help keep tension lower but has not had a full week to test if it will keep working. As of 7/6: Pt. had met this goal but has a new UTI and it caused pain to spike to 8/10 over the weekend.    Time 5    Period Weeks    Status On-going    Target Date 10/23/19      PT SHORT TERM GOAL #3   Title Patient will demonstrate HEP x1 in the clinic to demonstrate understanding and proper form to allow for further improvement.    Baseline Pt. lacks knowledge of therepeutic exercises that can decrease her pain/Sx.    Time 5    Period Weeks    Status Achieved    Target Date 04/20/19      PT SHORT TERM GOAL #4   Title Patient will report consistent use of foot-stool (squatty-potty) for positioning with BM to decrease pain with BM and intra-abdominal pressure.    Baseline Pt. having constipation due to PFM dysfunction    Time 5    Period Weeks    Status Achieved    Target Date 04/20/19             PT Long Term Goals - 10/21/19 1424      PT LONG TERM GOAL #1   Title Pt. will be able to participate in regular ADL's with least restrictive device without pain increasing greater than 2/10    Baseline Pt. limited in her ability to perform household duties by increased pain and fatigue. As of 4/29: Her legs have more endurance with walking before she gets a tremor. Still needs to sit down the majority of the time she is cooking. Still needs back support for prolonged sitting. As of 6/10: Pt. able to do for  ~1 hour before she needs to rest to prevent increased pain, pain is averaging ~ 4-5/10    Time 10    Period Weeks    Status On-going    Target  Date  12/30/19      PT LONG TERM GOAL #2   Title Patient will score at or below 65/300  on the PFDI and 35% on the Female NIH-CPSI to demonstrate a clinically meaningful decrease in disability and distress due to pelvic floor dysfunction.    Baseline PFDI: 110/300, Female NIH-CPSI: 29/43 (67%) As of 4/29: 100/300 on PFDI and 30/43 on NIH-CPSI As of 7/6:  NIH-CPSI: 32/43 POPDI: 61/100, CRADI-8: 29/100, UDI: 44/100 (PFDI: 149/300)    Time 10    Period Weeks    Status On-going    Target Date 12/30/19      PT LONG TERM GOAL #3   Title Patient will report no pain with intercourse to demonstrate improved functional ability.    Baseline Pt. Having significant pain with intercourse. As of 4/29: is a little better (20-30%) but is worse on days where overall tension/pain is worse. As of 6/10: can have days where there is not much pain but other days where there is significant pain.    Time 10    Period Weeks    Status On-going      PT LONG TERM GOAL #4   Title Pt will report ability to work in her yard for greater than 30 minutes without extreme fatigue    Baseline limited to 10-15 minutes before pt requires ending activity. As of 4/29: ~ 20 min. As of 6/10: has not been gardening much but can do ~ 30 min of similar indoor activity level before increased fatigue.    Time 10    Period Weeks    Status Achieved    Target Date 10/23/19      PT LONG TERM GOAL #5   Title Pt. will be able to go 2-3 hours between emptying her bladder without increased pain and empty her bladder fully whether by urostomy, cath, or voluntary release.    Baseline Pt. has a urostomy but continues to have to self-catheterize. Has pain with bladder filling and when using catheter. As of 4/29: is doing better since starting to take the pregabalin andd baclofen but occasionally still has pain with bladder filling. It is bad when she has an infection or over-does activity, better on "normal" days which have been few and far between  due to frequent UTI's. As of 6/10: Pt. has "good" and "bad" days but has more days where she can go 2-3 hours without increased pain    Time 10    Period Weeks    Status On-going    Target Date 12/30/19      PT LONG TERM GOAL #6   Title Patient will report having BM's at least every-other day with consistency between [employer] stool scale 3-5 over the prior week to demonstrate decreased constipation.    Baseline Pt. unable to have regula BM's without medication, manometry shows PFM dysfunction. As of 4/29: still having to use and enema or supository occasionally and still needing linzess. As of 6/10: Pt. has days where she can empty ok and days where she needs to use an Enema to stimulate a BM, feels that it is just the insertion of the enema, not the solution that does the job.    Time 10    Period Weeks    Status On-going    Target Date 12/30/19                 Plan - 01/09/20 0909    Clinical Impression Statement Pt. Responded well to all interventions today,  demonstrating decreased spasm and TTP, ease of breathing, improved understanding of E-stim procedure, as well as understanding and correct performance of all education and exercises provided today. They will continue to benefit from skilled physical therapy to work toward remaining goals and maximize function as well as decrease likelihood of symptom increase or recurrence.    PT Next Visit Plan Ask about TENS and nerve block success, TPDN to hip-flexors, MFR to lower abdomen, Review pain diary, cupping to medial thighs and upper back, ask about UTI Sx, if using uqora yet, neural glides on LLE, re-assess for TTP, try TENS near coccyx, focus on graded activity, and seated and standing balance/core strength. review I's Y's and T's in prone, thoracic mobility? perform further release and PNF for R>L shoulder blades, more scar-release and cupping at suprapubic scar, MFR around B ilium. TPDN to multifidus through T-L  junction with E-stim  at $[id number]$  and at 2 hz. continue to work on thoracolumbar rotation ROM and decreasing spasms, add SLS balance training, begin strengthening for reciprocal inhibition.    PT Home Exercise Plan A day: low back stretch, side-stretch, hamstring stretch, butterfly stretch, hip EXT in prone with knee bent, side-lying hip ABD, calf raises. B day: Chest-stretch, Thoracic extensions over a towel roll, seated rows and seated scapular retraction (pull shoulder blades together like "T's") with band, child's pose stretch, self internal TP release (especially when Pelvic floor spasms high), Every day: Pain diary, Posterior pelvic tilts with deep core activation and red band for obliques activation, bow-and-arrow, TENS unit 3+ times per day for 30 min and as needed if pain starts to increase. TENS at ASIS/PSIS at strong tingle for 1 hr. per day. Sleeper stretch PRN    Consulted and Agree with Plan of Care Patient           Patient will benefit from skilled therapeutic intervention in order to improve the following deficits and impairments:     Visit Diagnosis: Other muscle spasm  Muscle weakness (generalized)  Difficulty in walking, not elsewhere classified     Problem List Patient Active Problem List   Diagnosis Date Noted  . Seizure ([place]) 11/11/2018  . Major depressive disorder, recurrent episode, moderate ([place]) 02/07/2018  . Nephrolithiasis 04/16/2016  . Numbness 07/28/2015  . Bladder retention 06/23/2015  . Abdominal pain 06/04/2015  . Dizziness 05/18/2015  . Neck pain 05/18/2015  . Complicated migraine 16/60/6301  . Other fatigue 04/28/2015  . Abnormal finding on MRI of brain 04/28/2015  . D (diarrhea) 03/29/2015  . H/O disease 03/29/2015  . Abnormal weight loss 03/29/2015  . Muscle weakness (generalized) 03/14/2015  . Headache, migraine 03/10/2015   Willa Rough DPT, ATC Willa Rough 01/09/2020, 9:18 AM  [place] MAIN [facility] SERVICES [place], [place], [address] Phone: [phone]   Fax:  [phone]  Name: Jazmeen Axtell MRN: [id number] Date of Birth: 1980-05-05

## 2020-01-19 ENCOUNTER — Ambulatory Visit: Payer: BC Managed Care – PPO | Attending: Gastroenterology

## 2020-01-19 ENCOUNTER — Other Ambulatory Visit: Payer: Self-pay

## 2020-01-19 DIAGNOSIS — M62838 Other muscle spasm: Secondary | ICD-10-CM

## 2020-01-19 DIAGNOSIS — M6281 Muscle weakness (generalized): Secondary | ICD-10-CM | POA: Diagnosis present

## 2020-01-19 DIAGNOSIS — R262 Difficulty in walking, not elsewhere classified: Secondary | ICD-10-CM

## 2020-01-19 NOTE — Therapy (Signed)
[facility] MAIN [facility] SERVICES [address] [place], [place], [address] Phone: [phone]   Fax:  [phone]  Physical Therapy Treatment  The patient has been informed of current processes in place at Outpatient Rehab to protect patients from Covid-19 exposure including social distancing, schedule modifications, and new cleaning procedures. After discussing their particular risk with a therapist based on the patient's personal risk factors, the patient has decided to proceed with in-person therapy.   Patient Details  Name: Christine Chavez MRN: [id number] Date of Birth: 1980/12/11 Referring Provider (PT): [name]   Encounter Date: 01/19/2020   PT End of Session - 01/19/20 1626    Visit Number 61    Number of Visits 68    Date for PT Re-Evaluation 12/30/19    Authorization Type [employer]    Authorization Time Period from 10/22/2019 through 12/30/2019    Authorization - Visit Number 13    Authorization - Number of Visits 20    Progress Note Due on Visit 58    PT Start Time 1330    PT Stop Time 1430    PT Time Calculation (min) 60 min    Activity Tolerance Patient tolerated treatment well;No increased pain    Behavior During Therapy WFL for tasks assessed/performed           Past Medical History:  Diagnosis Date  . Complication of anesthesia    ? seizures after anesthesia   . Headache   . Migraines   . Neurogenic bladder   . Renal disorder   . Vision abnormalities     Past Surgical History:  Procedure Laterality Date  . ANTERIOR CRUCIATE LIGAMENT REPAIR  1997  . APPENDECTOMY    . COLONOSCOPY WITH PROPOFOL N/A 11/11/2018   Procedure: COLONOSCOPY WITH PROPOFOL;  Surgeon: Christena Deem, MD;  Location: [facility] ENDOSCOPY;  Service: Endoscopy;  Laterality: N/A;  . CYSTOSCOPY WITH STENT PLACEMENT Right 04/17/2016   Procedure: CYSTOSCOPY WITH STENT PLACEMENT;  Surgeon: Malen Gauze, MD;  Location: [facility] ORS;  Service:  Urology;  Laterality: Right;  . ESOPHAGOGASTRODUODENOSCOPY (EGD) WITH PROPOFOL N/A 11/11/2018   Procedure: ESOPHAGOGASTRODUODENOSCOPY (EGD) WITH PROPOFOL;  Surgeon: Christena Deem, MD;  Location[facility] ENDOSCOPY;  Service: Endoscopy;  Laterality: N/A;  . EXPLORATORY LAPAROTOMY  1999  . KIDNEY STONE SURGERY  04/2016  . REVISION UROSTOMY CUTANEOUS    . REVISION UROSTOMY CUTANEOUS  01/10/2018  . SUPRAPUBIC CATHETER PLACEMENT  08/2017  . TONSILLECTOMY      There were no vitals filed for this visit.  Pelvic Floor Physical Therapy Treatment Note  SCREENING  Changes in medications, allergies, or medical history?: nerve block   SUBJECTIVE  Patient reports: Had the nerve block on Monday and it got "a little scary" because they brushed the nerve that goes to the R leg. It has gradually been getting better but not back to normal yet (~65-70% better). She does feel like it is helping some but she is still having urine that "stinks to high heaven" and it hurts when she empties her bladder". Had to use a suppository to be able to just leave the house. She can feel that the bladder is still spasming and feels like a UTI even  though she can feel the other spasms/pain are less following the ablation. She can see a lot of mucus and "nasty stuff" in her bag even though she had changed the bag the day before. She still feels pretty sick whenever she eats  and it feels like her stomach is burning.  Her husband has a kidney stone. She still is getting the sensation like she is getting kicked in the balls at the end of catheterizing. Her flank pain is very much associated with her bladder pain. She has bought Kefir and has not tried it yet, has tried [place] but not a big fan of the taste. She is getting fatigue in her R arm when she writes a thank you card. It starts to hurt enough that she has to stop writing.  Precautions:  Neurological diagnosis pending, has "non-specific" brain lesions   Pain  update: (numbers not updated due to Pt. Difficulty describing sensation) Location of pain: B Flank pain (pelvic area) Current pain:  5/10 (5/10) Max pain:  8/10 (8/10 when it spasms) Least pain:  3/10 (3/10) Nature of pain: sharp to dull ache (grabbing)  *decreased spasm and pain in the back following treatment.  Patient Goals: Get rid of the discomfort with catheterizing and ache/bladder pain. Be able to take fewer medications and be able to enjoy spending time with her family again, doing "all the mom things"    OBJECTIVE  Changes in:  Observations/Posture:  L lumbar curve prominent with transverse processes more prominent on the L. (from previous note)  Posture again improved as Pt. Is arriving in her motorized chair.  Range of Motion/Flexibilty:  (from 12/10) Spine: ~ 2-3 fingers from knee with SB B. ~ 50% reduced R rotation with stretch in L, ~ 75% reduced L rotation with increased tightness. HS: able to achieve 90 deg. Hip flexion in seated HS stretch. (from prior treatment)   Strength/MMT:  LE MMT (from 12-10) LE MMT Left Right  Hip flex:  (L2) /5 /5  Hip ext: 3+/5 3+/5  Hip abd: 4/5 4/5  Hip add: 4/5 3+/5  Hip IR 4/5 3+/5  Hip ER 4/5 4/5   (From 3/24): Pt. Demonstrates 3/5 strength with "I's, Y's, and T's" but 4/5 with W's. (from 6/22) DF: 9 reps with B. Eversion/Inversion: 4/5 on R, 4+/5 on L. DF: L 4+/5, R 3+/5  Seated IR/ER on L: 4/5, R 3+/5 (from 8-5) L DF and Eversion 4+/5 with first 1-3 reps and fatigue decreased to 4/4. (9/9) Pt. Demonstrates ~ 4+/5 B with increased spasticity B.   Balance/stability:  (6-29) Pt. Able to achieve good posture without cueing but only able to maintain for ~ 2.5 min. Before seeking stability from the table and forward rolled shoulders. With cue to engage the deep core, Pt. Recognizes increased stability and comfort.  8/5: Pt. Demonstrating decreased stability in gait and standing balance today with fasciculations increasing  in direct relation to muscular endurance required. Able to stand ~ 10 sec. In SLS with MIN UE support before intensity of spasms make attempt at balance futile.    Pelvic Floor External Exam: (From prior session)- [Introitus Appears: elevated Skin integrity: WNL Palpation: TTP to B STP Cough: limited motion Prolapse visible?: no Scar mobility: not assessed  Internal Vaginal Exam: Strength (PERF): Pt. Has difficulty sensing the PFM to generate contraction or relaxation due to high tone/neuro involvement Symmetry: L>R for spasm/TTP Palpation: TTP to all muscles throughout B Prolapse: none]   Abdominal:  Pt. Demonstrated ability to recruit R>L obliques and TA with pelvic tilts. (from prior session)  Palpation: TTP with palpable bands to B paraspinals near T10-L1 region. Fascia in region appears WNL.  Gait Analysis:  INTERVENTIONS THIS SESSION:  Manual: Performed TP release and STM to  B paraspinals near T10-L1 region to decrease spasm and pain and allow for improved balance of musculature for improved function and decreased symptoms.  Dry-needle: Performed TPDN to B rectus abdominus near origin to decrease spasm and pain and allow for improved balance of musculature, improved ease of breathing, for improved function and decreased symptoms.  Self-care: Discussed continued sensation of UTI and increase in UE fatigue. Discussed changes following nerve ablation and expected timeline for maximal effects/ wearing off of RLE side-effect. Encouraged Pt. To begin using probiotic foods even in small amounts as tolerated. Agreed to contact Urology to discuss clinical insights about the way the Pt. Presents over time when she has/does not have a UTI (or has decreased UTI Sx) and ask about the potential to try Uqora (or similar product for bladder biofilm) in combination with an antibiotic to potentially increase effectiveness.   Total time: 60  min.                              PT Short Term Goals - 10/21/19 1408      PT SHORT TERM GOAL #1   Title Patient will demonstrate coordinated diaphragmatic breathing with pelvic tilts to demonstrate improved control of diaphragm and TA, to allow for further strengthening of core musculature and decreased pelvic floor spasm.    Baseline Pt. demonstrates breathing dysfunction and poor PFM coordination evidenced by anal manometry As of 2/22: Pt. continues to have restriction in her diaphragm and near T/L junction but is able to intentionally use diaphragmatic breathing through the available ROM.    Time 5    Period Weeks    Status Achieved    Target Date 04/20/19      PT SHORT TERM GOAL #2   Title Patient will report a reduction in pain to no greater than 6/10 over the prior week to demonstrate symptom improvement.    Baseline Pain is 10/10 at worst, 2/10 at best As of 2/22: Pt. had achieved this goal for 1 week as of 1/21 but may have infection or other insult that caused pain to increase again. As of 4/29: Pain high of 8/10 but has been over-doing her activity over the past week. As of 6/10: Pt. was able to keep pain at 6 or below since prior visit by using TENS to help keep tension lower but has not had a full week to test if it will keep working. As of 7/6: Pt. had met this goal but has a new UTI and it caused pain to spike to 8/10 over the weekend.    Time 5    Period Weeks    Status On-going    Target Date 10/23/19      PT SHORT TERM GOAL #3   Title Patient will demonstrate HEP x1 in the clinic to demonstrate understanding and proper form to allow for further improvement.    Baseline Pt. lacks knowledge of therepeutic exercises that can decrease her pain/Sx.    Time 5    Period Weeks    Status Achieved    Target Date 04/20/19      PT SHORT TERM GOAL #4   Title Patient will report consistent use of foot-stool (squatty-potty) for positioning with BM to  decrease pain with BM and intra-abdominal pressure.    Baseline Pt. having constipation due to PFM dysfunction    Time 5    Period Weeks    Status Achieved  Target Date 04/20/19             PT Long Term Goals - 10/21/19 1424      PT LONG TERM GOAL #1   Title Pt. will be able to participate in regular ADL's with least restrictive device without pain increasing greater than 2/10    Baseline Pt. limited in her ability to perform household duties by increased pain and fatigue. As of 4/29: Her legs have more endurance with walking before she gets a tremor. Still needs to sit down the majority of the time she is cooking. Still needs back support for prolonged sitting. As of 6/10: Pt. able to do for  ~1 hour before she needs to rest to prevent increased pain, pain is averaging ~ 4-5/10    Time 10    Period Weeks    Status On-going    Target Date 12/30/19      PT LONG TERM GOAL #2   Title Patient will score at or below 65/300  on the PFDI and 35% on the Female NIH-CPSI to demonstrate a clinically meaningful decrease in disability and distress due to pelvic floor dysfunction.    Baseline PFDI: 110/300, Female NIH-CPSI: 29/43 (67%) As of 4/29: 100/300 on PFDI and 30/43 on NIH-CPSI As of 7/6:  NIH-CPSI: 32/43 POPDI: 61/100, CRADI-8: 29/100, UDI: 44/100 (PFDI: 149/300)    Time 10    Period Weeks    Status On-going    Target Date 12/30/19      PT LONG TERM GOAL #3   Title Patient will report no pain with intercourse to demonstrate improved functional ability.    Baseline Pt. Having significant pain with intercourse. As of 4/29: is a little better (20-30%) but is worse on days where overall tension/pain is worse. As of 6/10: can have days where there is not much pain but other days where there is significant pain.    Time 10    Period Weeks    Status On-going      PT LONG TERM GOAL #4   Title Pt will report ability to work in her yard for greater than 30 minutes without extreme fatigue     Baseline limited to 10-15 minutes before pt requires ending activity. As of 4/29: ~ 20 min. As of 6/10: has not been gardening much but can do ~ 30 min of similar indoor activity level before increased fatigue.    Time 10    Period Weeks    Status Achieved    Target Date 10/23/19      PT LONG TERM GOAL #5   Title Pt. will be able to go 2-3 hours between emptying her bladder without increased pain and empty her bladder fully whether by urostomy, cath, or voluntary release.    Baseline Pt. has a urostomy but continues to have to self-catheterize. Has pain with bladder filling and when using catheter. As of 4/29: is doing better since starting to take the pregabalin andd baclofen but occasionally still has pain with bladder filling. It is bad when she has an infection or over-does activity, better on "normal" days which have been few and far between due to frequent UTI's. As of 6/10: Pt. has "good" and "bad" days but has more days where she can go 2-3 hours without increased pain    Time 10    Period Weeks    Status On-going    Target Date 12/30/19      PT LONG TERM GOAL #6  Title Patient will report having BM's at least every-other day with consistency between [facility] stool scale 3-5 over the prior week to demonstrate decreased constipation.    Baseline Pt. unable to have regula BM's without medication, manometry shows PFM dysfunction. As of 4/29: still having to use and enema or supository occasionally and still needing linzess. As of 6/10: Pt. has days where she can empty ok and days where she needs to use an Enema to stimulate a BM, feels that it is just the insertion of the enema, not the solution that does the job.    Time 10    Period Weeks    Status On-going    Target Date 12/30/19                 Plan - 01/20/20 4825    Clinical Impression Statement Pt. Responded well to all interventions today, demonstrating decreased spasm and pain in her back as well as understanding and  correct performance of all education and exercises provided today. She is, however, obviously distressed at her continued UTI symptoms and feeling out of options to improve her physical and mental health. They will continue to benefit from skilled physical therapy to work toward remaining goals and maximize function as well as decrease likelihood of symptom increase or recurrence.    PT Next Visit Plan Ask about TENS and nerve block success, TPDN to hip-flexors, MFR to lower abdomen, Review pain diary, cupping to medial thighs and upper back, ask about UTI Sx, if using uqora yet, neural glides on LLE, re-assess for TTP, try TENS near coccyx, focus on graded activity, and seated and standing balance/core strength. review I's Y's and T's in prone, thoracic mobility? perform further release and PNF for R>L shoulder blades, more scar-release and cupping at suprapubic scar, MFR around B ilium. TPDN to multifidus through T-L  junction with E-stim at $[id number]$  and at 2 hz. continue to work on thoracolumbar rotation ROM and decreasing spasms, add SLS balance training, begin strengthening for reciprocal inhibition.    PT Home Exercise Plan A day: low back stretch, side-stretch, hamstring stretch, butterfly stretch, hip EXT in prone with knee bent, side-lying hip ABD, calf raises. B day: Chest-stretch, Thoracic extensions over a towel roll, seated rows and seated scapular retraction (pull shoulder blades together like "T's") with band, child's pose stretch, self internal TP release (especially when Pelvic floor spasms high), Every day: Pain diary, Posterior pelvic tilts with deep core activation and red band for obliques activation, bow-and-arrow, TENS unit 3+ times per day for 30 min and as needed if pain starts to increase. TENS at ASIS/PSIS at strong tingle for 1 hr. per day. Sleeper stretch PRN    Consulted and Agree with Plan of Care Patient           Patient will benefit from skilled therapeutic intervention in  order to improve the following deficits and impairments:     Visit Diagnosis: Other muscle spasm  Muscle weakness (generalized)  Difficulty in walking, not elsewhere classified     Problem List Patient Active Problem List   Diagnosis Date Noted  . Seizure ([place]) 11/11/2018  . Major depressive disorder, recurrent episode, moderate ([place]) 02/07/2018  . Nephrolithiasis 04/16/2016  . Numbness 07/28/2015  . Bladder retention 06/23/2015  . Abdominal pain 06/04/2015  . Dizziness 05/18/2015  . Neck pain 05/18/2015  . Complicated migraine 00/37/0488  . Other fatigue 04/28/2015  . Abnormal finding on MRI of brain 04/28/2015  . D (diarrhea) 03/29/2015  .  H/O disease 03/29/2015  . Abnormal weight loss 03/29/2015  . Muscle weakness (generalized) 03/14/2015  . Headache, migraine 03/10/2015   Willa Rough DPT, ATC Willa Rough 01/20/2020, 12:13 PM  [place] MAIN [facility] SERVICES [address] [place], [place], [address] Phone: [phone]   Fax:  [phone]  Name: Christine Chavez MRN: [id number] Date of Birth: 24-Jun-1980

## 2020-01-22 ENCOUNTER — Ambulatory Visit: Payer: BC Managed Care – PPO

## 2020-01-22 ENCOUNTER — Other Ambulatory Visit: Payer: Self-pay

## 2020-01-22 DIAGNOSIS — R262 Difficulty in walking, not elsewhere classified: Secondary | ICD-10-CM

## 2020-01-22 DIAGNOSIS — M62838 Other muscle spasm: Secondary | ICD-10-CM | POA: Diagnosis not present

## 2020-01-22 DIAGNOSIS — M6281 Muscle weakness (generalized): Secondary | ICD-10-CM

## 2020-01-22 NOTE — Therapy (Signed)
[place] MAIN [facility] SERVICES [address] [place], [place], [address] Phone: [phone]   Fax:  [phone]  Physical Therapy Treatment  The patient has been informed of current processes in place at Outpatient Rehab to protect patients from Covid-19 exposure including social distancing, schedule modifications, and new cleaning procedures. After discussing their particular risk with a therapist based on the patient's personal risk factors, the patient has decided to proceed with in-person therapy.  Patient Details  Name: Christine Chavez MRN: [id number] Date of Birth: [birth year] Referring Provider (PT): [name]   Encounter Date: 01/22/2020   PT End of Session - 01/22/20 1412    Visit Number 63    Number of Visits 7    Date for PT Re-Evaluation 12/30/19    Authorization Type [employer]    Authorization Time Period from 10/22/2019 through 12/30/2019    Authorization - Visit Number 14    Authorization - Number of Visits 20    Progress Note Due on Visit 44    PT Start Time 0930    PT Stop Time 1030    PT Time Calculation (min) 60 min    Activity Tolerance Patient tolerated treatment well;No increased pain    Behavior During Therapy WFL for tasks assessed/performed           Past Medical History:  Diagnosis Date  . Complication of anesthesia    ? seizures after anesthesia   . Headache   . Migraines   . Neurogenic bladder   . Renal disorder   . Vision abnormalities     Past Surgical History:  Procedure Laterality Date  . ANTERIOR CRUCIATE LIGAMENT REPAIR  1997  . APPENDECTOMY    . COLONOSCOPY WITH PROPOFOL N/A 11/11/2018   Procedure: COLONOSCOPY WITH PROPOFOL;  Surgeon: [name], MD;  Location: [facility] ENDOSCOPY;  Service: Endoscopy;  Laterality: N/A;  . CYSTOSCOPY WITH STENT PLACEMENT Right 04/17/2016   Procedure: CYSTOSCOPY WITH STENT PLACEMENT;  Surgeon: [name], MD;  Location: [facility] ORS;  Service:  Urology;  Laterality: Right;  . ESOPHAGOGASTRODUODENOSCOPY (EGD) WITH PROPOFOL N/A 11/11/2018   Procedure: ESOPHAGOGASTRODUODENOSCOPY (EGD) WITH PROPOFOL;  Surgeon: [name], MD;  Location[facility] ENDOSCOPY;  Service: Endoscopy;  Laterality: N/A;  . EXPLORATORY LAPAROTOMY  1999  . KIDNEY STONE SURGERY  04/2016  . REVISION UROSTOMY CUTANEOUS    . REVISION UROSTOMY CUTANEOUS  01/10/2018  . SUPRAPUBIC CATHETER PLACEMENT  08/2017  . TONSILLECTOMY      There were no vitals filed for this visit.   Pelvic Floor Physical Therapy Treatment Note  SCREENING  Changes in medications, allergies, or medical history?: nerve block   SUBJECTIVE  Patient reports: Has spoken with GI and they want her to not repeat testing until she is consistently having diarrhea. The pelvic floor spasm sensation has further improved even since Tuesday. RLE is still 65-70% better from where it was is feeling less sensation but tingling is intermittent. The back did feel better until the next day when she was at her son's ball game and delayed emptying her bladder due to no access to clean restrooms, also got a migraine that evening.  Precautions:  Neurological diagnosis pending, has "non-specific" brain lesions   Pain update: (numbers not updated due to Pt. Difficulty describing sensation) Location of pain: B Flank pain (urethral) Current pain:  5/10 (5/10) Max pain:  7/10 (7/10 when it spasms) Least pain:  2/10 (3/10) Nature of pain: sharp to dull ache (  grabbing)  *decreased spasm and TTP to B adductors following treatment.  Patient Goals: Get rid of the discomfort with catheterizing and ache/bladder pain. Be able to take fewer medications and be able to enjoy spending time with her family again, doing "all the mom things"    OBJECTIVE  Changes in:  Observations/Posture:  L lumbar curve prominent with transverse processes more prominent on the L. (from previous note)  Posture again improved as  Pt. Is arriving in her motorized chair.  Range of Motion/Flexibilty:  (from 12/10) Spine: ~ 2-3 fingers from knee with SB B. ~ 50% reduced R rotation with stretch in L, ~ 75% reduced L rotation with increased tightness. HS: able to achieve 90 deg. Hip flexion in seated HS stretch. (from prior treatment)   Strength/MMT:  LE MMT (from 12-10) LE MMT Left Right  Hip flex:  (L2) /5 /5  Hip ext: 3+/5 3+/5  Hip abd: 4/5 4/5  Hip add: 4/5 3+/5  Hip IR 4/5 3+/5  Hip ER 4/5 4/5   (From 3/24): Pt. Demonstrates 3/5 strength with "I's, Y's, and T's" but 4/5 with W's. (from 6/22) DF: 9 reps with B. Eversion/Inversion: 4/5 on R, 4+/5 on L. DF: L 4+/5, R 3+/5  Seated IR/ER on L: 4/5, R 3+/5 (from 8-5) L DF and Eversion 4+/5 with first 1-3 reps and fatigue decreased to 4/4. (9/9) Pt. Demonstrates ~ 4+/5 B with increased spasticity B.   Balance/stability:  (6-29) Pt. Able to achieve good posture without cueing but only able to maintain for ~ 2.5 min. Before seeking stability from the table and forward rolled shoulders. With cue to engage the deep core, Pt. Recognizes increased stability and comfort.  8/5: Pt. Demonstrating decreased stability in gait and standing balance today with fasciculations increasing in direct relation to muscular endurance required. Able to stand ~ 10 sec. In SLS with MIN UE support before intensity of spasms make attempt at balance futile.    Pelvic Floor External Exam: (From prior session)- [Introitus Appears: elevated Skin integrity: WNL Palpation: TTP to B STP Cough: limited motion Prolapse visible?: no Scar mobility: not assessed  Internal Vaginal Exam: Strength (PERF): Pt. Has difficulty sensing the PFM to generate contraction or relaxation due to high tone/neuro involvement Symmetry: L>R for spasm/TTP Palpation: TTP to all muscles throughout B Prolapse: none]  TODAY: some TTP through B IC and a little (but minimal) at B STP and Bulbo.   Abdominal:  Pt.  Demonstrated ability to recruit R>L obliques and TA with pelvic tilts. (from prior session)  Palpation: TTP with palpable bands to B adductor brevis with referred pain to pelvis and down to R knee.  Gait Analysis:  INTERVENTIONS THIS SESSION:  Manual: re-assessed PFM externally and Performed TP release and STM to B adductor brevis near T10-L1 region to decrease spasm and pain and allow for improved balance of musculature for improved function and decreased symptoms.  Dry-needle: Performed TPDN to B rectus abdominus near origin to decrease spasm and pain and allow for improved balance of musculature, improved ease of breathing, for improved function and decreased symptoms.  Self-care: Discussed plan to start Uqora products and then try to add in the antibiotic that her MD thinks is appropriate after starting the product to allow for greatest effectiveness and then continue with Uqora products to prevent return of UTI if success achieved. Also discussed asking about a consult with urogynecology due to complexity of case and little progress.  Total time: 60 min.  Trigger Point Dry Needling - 01/22/20 0001    Consent Given? Yes    Education Handout Provided No    Muscles Treated Lower Quadrant Adductor longus/brevis/magnus    Dry Needling Comments B Brevis    Adductor Response Twitch response elicited;Palpable increased muscle length                  PT Short Term Goals - 10/21/19 1408      PT SHORT TERM GOAL #1   Title Patient will demonstrate coordinated diaphragmatic breathing with pelvic tilts to demonstrate improved control of diaphragm and TA, to allow for further strengthening of core musculature and decreased pelvic floor spasm.    Baseline Pt. demonstrates breathing dysfunction and poor PFM coordination evidenced by anal manometry As of 2/22: Pt. continues to have restriction in her diaphragm and near T/L junction but is able to  intentionally use diaphragmatic breathing through the available ROM.    Time 5    Period Weeks    Status Achieved    Target Date 04/20/19      PT SHORT TERM GOAL #2   Title Patient will report a reduction in pain to no greater than 6/10 over the prior week to demonstrate symptom improvement.    Baseline Pain is 10/10 at worst, 2/10 at best As of 2/22: Pt. had achieved this goal for 1 week as of 1/21 but may have infection or other insult that caused pain to increase again. As of 4/29: Pain high of 8/10 but has been over-doing her activity over the past week. As of 6/10: Pt. was able to keep pain at 6 or below since prior visit by using TENS to help keep tension lower but has not had a full week to test if it will keep working. As of 7/6: Pt. had met this goal but has a new UTI and it caused pain to spike to 8/10 over the weekend.    Time 5    Period Weeks    Status On-going    Target Date 10/23/19      PT SHORT TERM GOAL #3   Title Patient will demonstrate HEP x1 in the clinic to demonstrate understanding and proper form to allow for further improvement.    Baseline Pt. lacks knowledge of therepeutic exercises that can decrease her pain/Sx.    Time 5    Period Weeks    Status Achieved    Target Date 04/20/19      PT SHORT TERM GOAL #4   Title Patient will report consistent use of foot-stool (squatty-potty) for positioning with BM to decrease pain with BM and intra-abdominal pressure.    Baseline Pt. having constipation due to PFM dysfunction    Time 5    Period Weeks    Status Achieved    Target Date 04/20/19             PT Long Term Goals - 10/21/19 1424      PT LONG TERM GOAL #1   Title Pt. will be able to participate in regular ADL's with least restrictive device without pain increasing greater than 2/10    Baseline Pt. limited in her ability to perform household duties by increased pain and fatigue. As of 4/29: Her legs have more endurance with walking before she gets a  tremor. Still needs to sit down the majority of the time she is cooking. Still needs back support for prolonged sitting. As of 6/10: Pt. able to do for  ~1  hour before she needs to rest to prevent increased pain, pain is averaging ~ 4-5/10    Time 10    Period Weeks    Status On-going    Target Date 12/30/19      PT LONG TERM GOAL #2   Title Patient will score at or below 65/300  on the PFDI and 35% on the Female NIH-CPSI to demonstrate a clinically meaningful decrease in disability and distress due to pelvic floor dysfunction.    Baseline PFDI: 110/300, Female NIH-CPSI: 29/43 (67%) As of 4/29: 100/300 on PFDI and 30/43 on NIH-CPSI As of 7/6:  NIH-CPSI: 32/43 POPDI: 61/100, CRADI-8: 29/100, UDI: 44/100 (PFDI: 149/300)    Time 10    Period Weeks    Status On-going    Target Date 12/30/19      PT LONG TERM GOAL #3   Title Patient will report no pain with intercourse to demonstrate improved functional ability.    Baseline Pt. Having significant pain with intercourse. As of 4/29: is a little better (20-30%) but is worse on days where overall tension/pain is worse. As of 6/10: can have days where there is not much pain but other days where there is significant pain.    Time 10    Period Weeks    Status On-going      PT LONG TERM GOAL #4   Title Pt will report ability to work in her yard for greater than 30 minutes without extreme fatigue    Baseline limited to 10-15 minutes before pt requires ending activity. As of 4/29: ~ 20 min. As of 6/10: has not been gardening much but can do ~ 30 min of similar indoor activity level before increased fatigue.    Time 10    Period Weeks    Status Achieved    Target Date 10/23/19      PT LONG TERM GOAL #5   Title Pt. will be able to go 2-3 hours between emptying her bladder without increased pain and empty her bladder fully whether by urostomy, cath, or voluntary release.    Baseline Pt. has a urostomy but continues to have to self-catheterize. Has pain  with bladder filling and when using catheter. As of 4/29: is doing better since starting to take the pregabalin andd baclofen but occasionally still has pain with bladder filling. It is bad when she has an infection or over-does activity, better on "normal" days which have been few and far between due to frequent UTI's. As of 6/10: Pt. has "good" and "bad" days but has more days where she can go 2-3 hours without increased pain    Time 10    Period Weeks    Status On-going    Target Date 12/30/19      PT LONG TERM GOAL #6   Title Patient will report having BM's at least every-other day with consistency between [facility] stool scale 3-5 over the prior week to demonstrate decreased constipation.    Baseline Pt. unable to have regula BM's without medication, manometry shows PFM dysfunction. As of 4/29: still having to use and enema or supository occasionally and still needing linzess. As of 6/10: Pt. has days where she can empty ok and days where she needs to use an Enema to stimulate a BM, feels that it is just the insertion of the enema, not the solution that does the job.    Time 10    Period Weeks    Status On-going    Target  Date 12/30/19                 Plan - 01/22/20 1413    Clinical Impression Statement Pt. Responded well to all interventions today, demonstrating improved understanding of how Uqora could help in combination with an antibiotic to increase the effectiveness and cut through the biofilm, decreased adductor spasm and TTP, as well as understanding and correct performance of all education and exercises provided today. They will continue to benefit from skilled physical therapy to work toward remaining goals and maximize function as well as decrease likelihood of symptom increase or recurrence.    PT Next Visit Plan Ask about Jonette Eva use, re-assess PFM and release internally, TPDN to hip-flexors, MFR to lower abdomen, Review/ give new pain diary, cupping to medial thighs and upper  back, re-assess neural glides on LLE, focus on graded activity, and seated and standing balance/core strength. review I's Y's and T's in prone, thoracic mobility? perform further release and PNF for R>L shoulder blades, more scar-release and cupping at suprapubic scar, MFR around B ilium. continue to work on thoracolumbar rotation ROM and decreasing spasms, add SLS balance training, .    PT Home Exercise Plan A day: low back stretch, side-stretch, hamstring stretch, butterfly stretch, hip EXT in prone with knee bent, side-lying hip ABD, calf raises. B day: Chest-stretch, Thoracic extensions over a towel roll, seated rows and seated scapular retraction (pull shoulder blades together like "T's") with band, child's pose stretch, self internal TP release (especially when Pelvic floor spasms high), Every day: Pain diary, Posterior pelvic tilts with deep core activation and red band for obliques activation, bow-and-arrow, TENS unit 3+ times per day for 30 min and as needed if pain starts to increase. TENS at ASIS/PSIS at strong tingle for 1 hr. per day. Sleeper stretch PRN    Consulted and Agree with Plan of Care Patient           Patient will benefit from skilled therapeutic intervention in order to improve the following deficits and impairments:     Visit Diagnosis: Other muscle spasm  Muscle weakness (generalized)  Difficulty in walking, not elsewhere classified     Problem List Patient Active Problem List   Diagnosis Date Noted  . Seizure ([place]) 11/11/2018  . Major depressive disorder, recurrent episode, moderate ([place]) 02/07/2018  . Nephrolithiasis 04/16/2016  . Numbness 07/28/2015  . Bladder retention 06/23/2015  . Abdominal pain 06/04/2015  . Dizziness 05/18/2015  . Neck pain 05/18/2015  . Complicated migraine 19/50/9326  . Other fatigue 04/28/2015  . Abnormal finding on MRI of brain 04/28/2015  . D (diarrhea) 03/29/2015  . H/O disease 03/29/2015  . Abnormal weight loss 03/29/2015   . Muscle weakness (generalized) 03/14/2015  . Headache, migraine 03/10/2015   Willa Rough DPT, ATC Willa Rough 01/22/2020, 2:21 PM  [place] MAIN [facility] SERVICES [address] [place], [place], [address] Phone: [phone]   Fax:  [phone]  Name: Kateleen Encarnacion MRN: [id number] Date of Birth: [birth year]

## 2020-01-22 NOTE — Patient Instructions (Signed)
°  Darrold Junker has three products, the two I think you need the most are called "target" to decrease your urine acidity and allow for D-mannose to be the most effective and "control" which is a capsule to help decrease the biofilm in your bladder.   Urogynecologist: Dr Aura Dials

## 2020-01-27 ENCOUNTER — Ambulatory Visit: Payer: BC Managed Care – PPO

## 2020-01-27 ENCOUNTER — Other Ambulatory Visit: Payer: Self-pay

## 2020-01-27 DIAGNOSIS — M62838 Other muscle spasm: Secondary | ICD-10-CM

## 2020-01-27 DIAGNOSIS — R262 Difficulty in walking, not elsewhere classified: Secondary | ICD-10-CM

## 2020-01-27 DIAGNOSIS — M6281 Muscle weakness (generalized): Secondary | ICD-10-CM

## 2020-01-27 NOTE — Therapy (Signed)
[place] MAIN [facility] SERVICES [address] [place], [place], [address] Phone: [phone]   Fax:  [phone]  Physical Therapy Treatment  The patient has been informed of current processes in place at Outpatient Rehab to protect patients from Covid-19 exposure including social distancing, schedule modifications, and new cleaning procedures. After discussing their particular risk with a therapist based on the patient's personal risk factors, the patient has decided to proceed with in-person therapy.  Patient Details  Name: Christine Chavez MRN: [id number] Date of Birth: 11-12-1980 Referring Provider (PT): Christine Chavez   Encounter Date: 01/27/2020   PT End of Session - 01/27/20 1056    Visit Number 12    Number of Visits 31    Date for PT Re-Evaluation 12/30/19    Authorization Type [employer]    Authorization Time Period from 10/22/2019 through 12/30/2019    Authorization - Visit Number 15    Authorization - Number of Visits 20    Progress Note Due on Visit 38    PT Start Time 1030    PT Stop Time 1130    PT Time Calculation (min) 60 min    Activity Tolerance Patient tolerated treatment well;No increased pain    Behavior During Therapy WFL for tasks assessed/performed           Past Medical History:  Diagnosis Date  . Complication of anesthesia    ? seizures after anesthesia   . Headache   . Migraines   . Neurogenic bladder   . Renal disorder   . Vision abnormalities     Past Surgical History:  Procedure Laterality Date  . ANTERIOR CRUCIATE LIGAMENT REPAIR  1997  . APPENDECTOMY    . COLONOSCOPY WITH PROPOFOL N/A 11/11/2018   Procedure: COLONOSCOPY WITH PROPOFOL;  Surgeon: Christine Sails, MD;  Location: [facility] ENDOSCOPY;  Service: Endoscopy;  Laterality: N/A;  . CYSTOSCOPY WITH STENT PLACEMENT Right 04/17/2016   Procedure: CYSTOSCOPY WITH STENT PLACEMENT;  Surgeon: Christine Gustin, MD;  Location: [facility] ORS;  Service:  Urology;  Laterality: Right;  . ESOPHAGOGASTRODUODENOSCOPY (EGD) WITH PROPOFOL N/A 11/11/2018   Procedure: ESOPHAGOGASTRODUODENOSCOPY (EGD) WITH PROPOFOL;  Surgeon: Christine Sails, MD;  Location: [facility] ENDOSCOPY;  Service: Endoscopy;  Laterality: N/A;  . EXPLORATORY LAPAROTOMY  1999  . KIDNEY STONE SURGERY  04/2016  . REVISION UROSTOMY CUTANEOUS    . REVISION UROSTOMY CUTANEOUS  01/10/2018  . SUPRAPUBIC CATHETER PLACEMENT  08/2017  . TONSILLECTOMY      There were no vitals filed for this visit.  Pelvic Floor Physical Therapy Treatment Note  SCREENING  Changes in medications, allergies, or medical history?: nerve block   SUBJECTIVE  Patient reports: On Sunday she had to use a suppository because her bladder was spasming so hard after singing at church.Saw primary care on  Has an appointment at urogynecology for Oct. 29th.  Precautions:  Neurological diagnosis pending, has "non-specific" brain lesions   Pain update: (numbers not updated due to Pt. Difficulty describing sensation) Location of pain: B Flank pain (urethral) Current pain:  4(5/10) Max pain:  5/10 (9/10 when it spasms) Least pain:  2/10 (3/10) Nature of pain: sharp to dull ache (grabbing)  *decreased spasm and TTP to B adductors following treatment.  Patient Goals: Get rid of the discomfort with catheterizing and ache/bladder pain. Be able to take fewer medications and be able to enjoy spending time with her family again, doing "all the mom things"    OBJECTIVE  Changes  in:  Observations/Posture:  L lumbar curve prominent with transverse processes more prominent on the L. (from previous note)  Posture moderate due to folding chair lacking support but lumbar support is in place.  Range of Motion/Flexibilty:  (from 12/10) Spine: ~ 2-3 fingers from knee with SB B. ~ 50% reduced R rotation with stretch in L, ~ 75% reduced L rotation with increased tightness. HS: able to achieve 90 deg. Hip flexion in seated  HS stretch. (from prior treatment)   Strength/MMT:  LE MMT (from 12-10) LE MMT Left Right  Hip flex:  (L2) /5 /5  Hip ext: 3+/5 3+/5  Hip abd: 4/5 4/5  Hip add: 4/5 3+/5  Hip IR 4/5 3+/5  Hip ER 4/5 4/5   (From 3/24): Pt. Demonstrates 3/5 strength with "I's, Y's, and T's" but 4/5 with W's. (from 6/22) DF: 9 reps with B. Eversion/Inversion: 4/5 on R, 4+/5 on L. DF: L 4+/5, R 3+/5  Seated IR/ER on L: 4/5, R 3+/5 (from 8-5) L DF and Eversion 4+/5 with first 1-3 reps and fatigue decreased to 4/4. (9/9) Pt. Demonstrates ~ 4+/5 B with increased spasticity B.   Balance/stability:  (6-29) Pt. Able to achieve good posture without cueing but only able to maintain for ~ 2.5 min. Before seeking stability from the table and forward rolled shoulders. With cue to engage the deep core, Pt. Recognizes increased stability and comfort.  8/5: Pt. Demonstrating decreased stability in gait and standing balance today with fasciculations increasing in direct relation to muscular endurance required. Able to stand ~ 10 sec. In SLS with MIN UE support before intensity of spasms make attempt at balance futile.    Pelvic Floor External Exam: (From prior session)- [Introitus Appears: elevated Skin integrity: WNL Palpation: TTP to B STP Cough: limited motion Prolapse visible?: no Scar mobility: not assessed  Internal Vaginal Exam: Strength (PERF): Pt. Has difficulty sensing the PFM to generate contraction or relaxation due to high tone/neuro involvement Symmetry: L>R for spasm/TTP Palpation: TTP to all muscles throughout B Prolapse: none]  TODAY: not re-assessed today  Abdominal:  Pt. Demonstrated ability to recruit R>L obliques and TA with pelvic tilts. (from prior session)  Palpation: TTP to B iliacus and Psoas with radiation to vulva, LB and down thighs.  Gait Analysis:  INTERVENTIONS THIS SESSION:  Manual: Performed TP release to B Iliacus and Psoas to decrease spasm and pain and allow for  improved balance of musculature for improved function and decreased symptoms.  Therex: reviewed which exercises to emphasize including pelvic tilts and hip EXT in prone to improve strength of muscles opposing tight musculature to allow reciprocal inhibition to improve balance of musculature surrounding the pelvis and improve overall posture for optimal musculature length-tension relationship and function..  Dry-needle: Performed TPDN with a .30x164mm needle to B iliacus and Psoas near origin to decrease spasm and pain and allow for improved balance of musculature, improved ease of breathing, for improved function and decreased symptoms.  Self-care: Discussed upcoming appointment with urogynecology and urodynamics study, decided POC leading up to study. Decided to try increased fluid intake during the time while she is using her e-stim to see if it decreases her discomfort with bladder filling. Re-printed pain diary for Pt. To improve tracking of chronic pain/fatigue Sx.  Total time: 60 min.                       Trigger Point Dry Needling - 01/27/20 0001    Consent  Given? Yes    Education Handout Provided No    Muscles Treated Back/Hip Iliacus;Iliopsoas    Iliacus Response Twitch response elicited;Palpable increased muscle length    Iliopsoas Response Twitch response elicited;Palpable increased muscle length                  PT Short Term Goals - 10/21/19 1408      PT SHORT TERM GOAL #1   Title Patient will demonstrate coordinated diaphragmatic breathing with pelvic tilts to demonstrate improved control of diaphragm and TA, to allow for further strengthening of core musculature and decreased pelvic floor spasm.    Baseline Pt. demonstrates breathing dysfunction and poor PFM coordination evidenced by anal manometry As of 2/22: Pt. continues to have restriction in her diaphragm and near T/L junction but is able to intentionally use diaphragmatic breathing through the  available ROM.    Time 5    Period Weeks    Status Achieved    Target Date 04/20/19      PT SHORT TERM GOAL #2   Title Patient will report a reduction in pain to no greater than 6/10 over the prior week to demonstrate symptom improvement.    Baseline Pain is 10/10 at worst, 2/10 at best As of 2/22: Pt. had achieved this goal for 1 week as of 1/21 but may have infection or other insult that caused pain to increase again. As of 4/29: Pain high of 8/10 but has been over-doing her activity over the past week. As of 6/10: Pt. was able to keep pain at 6 or below since prior visit by using TENS to help keep tension lower but has not had a full week to test if it will keep working. As of 7/6: Pt. had met this goal but has a new UTI and it caused pain to spike to 8/10 over the weekend.    Time 5    Period Weeks    Status On-going    Target Date 10/23/19      PT SHORT TERM GOAL #3   Title Patient will demonstrate HEP x1 in the clinic to demonstrate understanding and proper form to allow for further improvement.    Baseline Pt. lacks knowledge of therepeutic exercises that can decrease her pain/Sx.    Time 5    Period Weeks    Status Achieved    Target Date 04/20/19      PT SHORT TERM GOAL #4   Title Patient will report consistent use of foot-stool (squatty-potty) for positioning with BM to decrease pain with BM and intra-abdominal pressure.    Baseline Pt. having constipation due to PFM dysfunction    Time 5    Period Weeks    Status Achieved    Target Date 04/20/19             PT Long Term Goals - 10/21/19 1424      PT LONG TERM GOAL #1   Title Pt. will be able to participate in regular ADL's with least restrictive device without pain increasing greater than 2/10    Baseline Pt. limited in her ability to perform household duties by increased pain and fatigue. As of 4/29: Her legs have more endurance with walking before she gets a tremor. Still needs to sit down the majority of the time  she is cooking. Still needs back support for prolonged sitting. As of 6/10: Pt. able to do for  ~1 hour before she needs to rest to prevent increased pain,  pain is averaging ~ 4-5/10    Time 10    Period Weeks    Status On-going    Target Date 12/30/19      PT LONG TERM GOAL #2   Title Patient will score at or below 65/300  on the PFDI and 35% on the Female NIH-CPSI to demonstrate a clinically meaningful decrease in disability and distress due to pelvic floor dysfunction.    Baseline PFDI: 110/300, Female NIH-CPSI: 29/43 (67%) As of 4/29: 100/300 on PFDI and 30/43 on NIH-CPSI As of 7/6:  NIH-CPSI: 32/43 POPDI: 61/100, CRADI-8: 29/100, UDI: 44/100 (PFDI: 149/300)    Time 10    Period Weeks    Status On-going    Target Date 12/30/19      PT LONG TERM GOAL #3   Title Patient will report no pain with intercourse to demonstrate improved functional ability.    Baseline Pt. Having significant pain with intercourse. As of 4/29: is a little better (20-30%) but is worse on days where overall tension/pain is worse. As of 6/10: can have days where there is not much pain but other days where there is significant pain.    Time 10    Period Weeks    Status On-going      PT LONG TERM GOAL #4   Title Pt will report ability to work in her yard for greater than 30 minutes without extreme fatigue    Baseline limited to 10-15 minutes before pt requires ending activity. As of 4/29: ~ 20 min. As of 6/10: has not been gardening much but can do ~ 30 min of similar indoor activity level before increased fatigue.    Time 10    Period Weeks    Status Achieved    Target Date 10/23/19      PT LONG TERM GOAL #5   Title Pt. will be able to go 2-3 hours between emptying her bladder without increased pain and empty her bladder fully whether by urostomy, cath, or voluntary release.    Baseline Pt. has a urostomy but continues to have to self-catheterize. Has pain with bladder filling and when using catheter. As of 4/29:  is doing better since starting to take the pregabalin andd baclofen but occasionally still has pain with bladder filling. It is bad when she has an infection or over-does activity, better on "normal" days which have been few and far between due to frequent UTI's. As of 6/10: Pt. has "good" and "bad" days but has more days where she can go 2-3 hours without increased pain    Time 10    Period Weeks    Status On-going    Target Date 12/30/19      PT LONG TERM GOAL #6   Title Patient will report having BM's at least every-other day with consistency between [facility] stool scale 3-5 over the prior week to demonstrate decreased constipation.    Baseline Pt. unable to have regula BM's without medication, manometry shows PFM dysfunction. As of 4/29: still having to use and enema or supository occasionally and still needing linzess. As of 6/10: Pt. has days where she can empty ok and days where she needs to use an Enema to stimulate a BM, feels that it is just the insertion of the enema, not the solution that does the job.    Time 10    Period Weeks    Status On-going    Target Date 12/30/19  Plan - 01/27/20 1056    Clinical Impression Statement Pt. Responded well to all interventions today, demonstrating decreased spasm and TTP through B hip-flexors as well as understanding and correct performance of all education and exercises provided today. They will continue to benefit from skilled physical therapy to work toward remaining goals and maximize function as well as decrease likelihood of symptom increase or recurrence.    PT Next Visit Plan Ask about Jonette Eva use, re-assess PFM and release internally with TPDN, MFR to lower abdomen, Review/ give new pain diary, cupping to medial thighs and upper back, re-assess neural glides on LLE, focus on graded activity, and seated and standing balance/core strength. review I's Y's and T's in prone, thoracic mobility? perform further release and PNF  for R>L shoulder blades, more scar-release and cupping at suprapubic scar, MFR around B ilium. continue to work on thoracolumbar rotation ROM and decreasing spasms, add SLS balance training, .    PT Home Exercise Plan A day: low back stretch, side-stretch, hamstring stretch, butterfly stretch, hip EXT in prone with knee bent, side-lying hip ABD, calf raises. B day: Chest-stretch, Thoracic extensions over a towel roll, seated rows and seated scapular retraction (pull shoulder blades together like "T's") with band, child's pose stretch, self internal TP release (especially when Pelvic floor spasms high), Every day: Pain diary, Posterior pelvic tilts with deep core activation and red band for obliques activation, bow-and-arrow, TENS unit 3+ times per day for 30 min and as needed if pain starts to increase. TENS at ASIS/PSIS at strong tingle for 1 hr. per day. Sleeper stretch PRN    Consulted and Agree with Plan of Care Patient           Patient will benefit from skilled therapeutic intervention in order to improve the following deficits and impairments:     Visit Diagnosis: Other muscle spasm  Muscle weakness (generalized)  Difficulty in walking, not elsewhere classified     Problem List Patient Active Problem List   Diagnosis Date Noted  . Seizure ([place]) 11/11/2018  . Major depressive disorder, recurrent episode, moderate ([place]) 02/07/2018  . Nephrolithiasis 04/16/2016  . Numbness 07/28/2015  . Bladder retention 06/23/2015  . Abdominal pain 06/04/2015  . Dizziness 05/18/2015  . Neck pain 05/18/2015  . Complicated migraine 59/93/5701  . Other fatigue 04/28/2015  . Abnormal finding on MRI of brain 04/28/2015  . D (diarrhea) 03/29/2015  . H/O disease 03/29/2015  . Abnormal weight loss 03/29/2015  . Muscle weakness (generalized) 03/14/2015  . Headache, migraine 03/10/2015   Willa Rough DPT, ATC Willa Rough 01/27/2020, 12:04 PM  [place]  MAIN [facility] SERVICES [address] [place], [place], [address] Phone: [phone]   Fax:  [phone]  Name: Christine Chavez MRN: [id number] Date of Birth: Jul 08, 1980

## 2020-01-29 ENCOUNTER — Ambulatory Visit: Payer: BC Managed Care – PPO

## 2020-01-29 ENCOUNTER — Other Ambulatory Visit: Payer: Self-pay

## 2020-01-29 DIAGNOSIS — R262 Difficulty in walking, not elsewhere classified: Secondary | ICD-10-CM

## 2020-01-29 DIAGNOSIS — M6281 Muscle weakness (generalized): Secondary | ICD-10-CM

## 2020-01-29 DIAGNOSIS — M62838 Other muscle spasm: Secondary | ICD-10-CM

## 2020-01-29 NOTE — Therapy (Signed)
[place] MAIN [facility] SERVICES [address] [place], [place], [address] Phone: [phone]   Fax:  [phone]  Physical Therapy Treatment  The patient has been informed of current processes in place at Outpatient Rehab to protect patients from Covid-19 exposure including social distancing, schedule modifications, and new cleaning procedures. After discussing their particular risk with a therapist based on the patient's personal risk factors, the patient has decided to proceed with in-person therapy.  Patient Details  Name: Christine Chavez MRN: [id number] Date of Birth: 1980-04-21 Referring Provider (PT): Christine Chavez   Encounter Date: 01/29/2020   PT End of Session - 01/29/20 1010    Visit Number 20    Number of Visits 70    Date for PT Re-Evaluation 12/30/19    Authorization Type [employer]    Authorization Time Period from 10/22/2019 through 12/30/2019    Authorization - Visit Number 16    Authorization - Number of Visits 20    Progress Note Due on Visit 57    PT Start Time 0937    PT Stop Time 1037    PT Time Calculation (min) 60 min    Activity Tolerance Patient tolerated treatment well;No increased pain    Behavior During Therapy WFL for tasks assessed/performed           Past Medical History:  Diagnosis Date   Complication of anesthesia    ? seizures after anesthesia    Headache    Migraines    Neurogenic bladder    Renal disorder    Vision abnormalities     Past Surgical History:  Procedure Laterality Date   ANTERIOR CRUCIATE LIGAMENT REPAIR  1997   APPENDECTOMY     COLONOSCOPY WITH PROPOFOL N/A 11/11/2018   Procedure: COLONOSCOPY WITH PROPOFOL;  Surgeon: [name], MD;  Location: [facility] ENDOSCOPY;  Service: Endoscopy;  Laterality: N/A;   CYSTOSCOPY WITH STENT PLACEMENT Right 04/17/2016   Procedure: CYSTOSCOPY WITH STENT PLACEMENT;  Surgeon: [name], MD;  Location: [facility] ORS;  Service:  Urology;  Laterality: Right;   ESOPHAGOGASTRODUODENOSCOPY (EGD) WITH PROPOFOL N/A 11/11/2018   Procedure: ESOPHAGOGASTRODUODENOSCOPY (EGD) WITH PROPOFOL;  Surgeon: [name], MD;  Location: [facility] ENDOSCOPY;  Service: Endoscopy;  Laterality: N/A;   EXPLORATORY LAPAROTOMY  1999   KIDNEY STONE SURGERY  04/2016   REVISION UROSTOMY CUTANEOUS     REVISION UROSTOMY CUTANEOUS  01/10/2018   SUPRAPUBIC CATHETER PLACEMENT  08/2017   TONSILLECTOMY      There were no vitals filed for this visit.  Pelvic Floor Physical Therapy Treatment Note  SCREENING  Changes in medications, allergies, or medical history?: nerve block   SUBJECTIVE  Patient reports: Yesterday she left the TENS unit on for most of the afternoon and tried to drink a lot of water again and feels like it was not as intense as what had happened on Sunday. She feels that it helped but was still able to feel the discomfort. Around 230 am she woke up with a really strong urge to go to the bathroom and only had taken her medicine with ~ 6 oz. Of water. This morning she can feel more pressure in her bladder like she nees to empty but she emptied right before she came.    Precautions:  Neurological diagnosis pending, has "non-specific" brain lesions   Pain update: (numbers not updated due to Pt. Difficulty describing sensation) Location of pain: B Flank pain (urethral) Current pain:  4(5/10) Max pain:  5/10 (9/10 when it spasms) Least pain:  2/10 (3/10) Nature of pain: sharp to dull ache (grabbing)  *decreased spasm and TTP to B adductors following treatment.  Patient Goals: Get rid of the discomfort with catheterizing and ache/bladder pain. Be able to take fewer medications and be able to enjoy spending time with her family again, doing "all the mom things"    OBJECTIVE  Changes in:  Observations/Posture:  L lumbar curve prominent with transverse processes more prominent on the L. (from previous note)   Range  of Motion/Flexibilty:  (from 12/10) Spine: ~ 2-3 fingers from knee with SB B. ~ 50% reduced R rotation with stretch in L, ~ 75% reduced L rotation with increased tightness. HS: able to achieve 90 deg. Hip flexion in seated HS stretch. (from prior treatment)   Strength/MMT:  LE MMT (from 12-10) LE MMT Left Right  Hip flex:  (L2) /5 /5  Hip ext: 3+/5 3+/5  Hip abd: 4/5 4/5  Hip add: 4/5 3+/5  Hip IR 4/5 3+/5  Hip ER 4/5 4/5   (From 3/24): Pt. Demonstrates 3/5 strength with "I's, Y's, and T's" but 4/5 with W's. (from 6/22) DF: 9 reps with B. Eversion/Inversion: 4/5 on R, 4+/5 on L. DF: L 4+/5, R 3+/5  Seated IR/ER on L: 4/5, R 3+/5 (from 8-5) L DF and Eversion 4+/5 with first 1-3 reps and fatigue decreased to 4/4. (9/9) Pt. Demonstrates ~ 4+/5 B with increased spasticity B.   Balance/stability:  (6-29) Pt. Able to achieve good posture without cueing but only able to maintain for ~ 2.5 min. Before seeking stability from the table and forward rolled shoulders. With cue to engage the deep core, Pt. Recognizes increased stability and comfort.  8/5: Pt. Demonstrating decreased stability in gait and standing balance today with fasciculations increasing in direct relation to muscular endurance required. Able to stand ~ 10 sec. In SLS with MIN UE support before intensity of spasms make attempt at balance futile.    Pelvic Floor External Exam: (From prior session)- [Introitus Appears: elevated Skin integrity: WNL Palpation: TTP to B STP Cough: limited motion Prolapse visible?: no Scar mobility: not assessed  Internal Vaginal Exam: Strength (PERF): Pt. Has difficulty sensing the PFM to generate contraction or relaxation due to high tone/neuro involvement Symmetry: L>R for spasm/TTP Palpation: TTP to all muscles throughout B Prolapse: none]  TODAY: unable to coordinate squeeze and TTP to all areas with the greatest at B OI and coccygeus.  ** following TP release Pt. Able to achieve  1/5 PFM contraction. Decreased TTP at B anterior PR/PC following treatment.  Abdominal:  Pt. Demonstrated ability to recruit R>L obliques and TA with pelvic tilts. (from prior session)  Palpation:   Gait Analysis:  INTERVENTIONS THIS SESSION:  Manual: Re-assessed PFM and performed TP release and STM to all PFM to decrease spasm and pain and allow for improved balance of musculature for improved function and decreased symptoms.  Dry-needle: Performed TPDN with a .30x175m needle to B iliacus and Psoas near origin to decrease spasm and pain and allow for improved balance of musculature, improved ease of breathing, for improved function and decreased symptoms.   Total time: 60 min.                       Trigger Point Dry Needling - 01/30/20 0001    Consent Given? Yes    Education Handout Provided No    Other Dry Needling PFM at perineal body  PT Short Term Goals - 10/21/19 1408      PT SHORT TERM GOAL #1   Title Patient will demonstrate coordinated diaphragmatic breathing with pelvic tilts to demonstrate improved control of diaphragm and TA, to allow for further strengthening of core musculature and decreased pelvic floor spasm.    Baseline Pt. demonstrates breathing dysfunction and poor PFM coordination evidenced by anal manometry As of 2/22: Pt. continues to have restriction in her diaphragm and near T/L junction but is able to intentionally use diaphragmatic breathing through the available ROM.    Time 5    Period Weeks    Status Achieved    Target Date 04/20/19      PT SHORT TERM GOAL #2   Title Patient will report a reduction in pain to no greater than 6/10 over the prior week to demonstrate symptom improvement.    Baseline Pain is 10/10 at worst, 2/10 at best As of 2/22: Pt. had achieved this goal for 1 week as of 1/21 but may have infection or other insult that caused pain to increase again. As of 4/29: Pain high of 8/10 but has  been over-doing her activity over the past week. As of 6/10: Pt. was able to keep pain at 6 or below since prior visit by using TENS to help keep tension lower but has not had a full week to test if it will keep working. As of 7/6: Pt. had met this goal but has a new UTI and it caused pain to spike to 8/10 over the weekend.    Time 5    Period Weeks    Status On-going    Target Date 10/23/19      PT SHORT TERM GOAL #3   Title Patient will demonstrate HEP x1 in the clinic to demonstrate understanding and proper form to allow for further improvement.    Baseline Pt. lacks knowledge of therepeutic exercises that can decrease her pain/Sx.    Time 5    Period Weeks    Status Achieved    Target Date 04/20/19      PT SHORT TERM GOAL #4   Title Patient will report consistent use of foot-stool (squatty-potty) for positioning with BM to decrease pain with BM and intra-abdominal pressure.    Baseline Pt. having constipation due to PFM dysfunction    Time 5    Period Weeks    Status Achieved    Target Date 04/20/19             PT Long Term Goals - 10/21/19 1424      PT LONG TERM GOAL #1   Title Pt. will be able to participate in regular ADL's with least restrictive device without pain increasing greater than 2/10    Baseline Pt. limited in her ability to perform household duties by increased pain and fatigue. As of 4/29: Her legs have more endurance with walking before she gets a tremor. Still needs to sit down the majority of the time she is cooking. Still needs back support for prolonged sitting. As of 6/10: Pt. able to do for  ~1 hour before she needs to rest to prevent increased pain, pain is averaging ~ 4-5/10    Time 10    Period Weeks    Status On-going    Target Date 12/30/19      PT LONG TERM GOAL #2   Title Patient will score at or below 65/300  on the PFDI and 35% on the Female NIH-CPSI  to demonstrate a clinically meaningful decrease in disability and distress due to pelvic floor  dysfunction.    Baseline PFDI: 110/300, Female NIH-CPSI: 29/43 (67%) As of 4/29: 100/300 on PFDI and 30/43 on NIH-CPSI As of 7/6:  NIH-CPSI: 32/43 POPDI: 61/100, CRADI-8: 29/100, UDI: 44/100 (PFDI: 149/300)    Time 10    Period Weeks    Status On-going    Target Date 12/30/19      PT LONG TERM GOAL #3   Title Patient will report no pain with intercourse to demonstrate improved functional ability.    Baseline Pt. Having significant pain with intercourse. As of 4/29: is a little better (20-30%) but is worse on days where overall tension/pain is worse. As of 6/10: can have days where there is not much pain but other days where there is significant pain.    Time 10    Period Weeks    Status On-going      PT LONG TERM GOAL #4   Title Pt will report ability to work in her yard for greater than 30 minutes without extreme fatigue    Baseline limited to 10-15 minutes before pt requires ending activity. As of 4/29: ~ 20 min. As of 6/10: has not been gardening much but can do ~ 30 min of similar indoor activity level before increased fatigue.    Time 10    Period Weeks    Status Achieved    Target Date 10/23/19      PT LONG TERM GOAL #5   Title Pt. will be able to go 2-3 hours between emptying her bladder without increased pain and empty her bladder fully whether by urostomy, cath, or voluntary release.    Baseline Pt. has a urostomy but continues to have to self-catheterize. Has pain with bladder filling and when using catheter. As of 4/29: is doing better since starting to take the pregabalin andd baclofen but occasionally still has pain with bladder filling. It is bad when she has an infection or over-does activity, better on "normal" days which have been few and far between due to frequent UTI's. As of 6/10: Pt. has "good" and "bad" days but has more days where she can go 2-3 hours without increased pain    Time 10    Period Weeks    Status On-going    Target Date 12/30/19      PT LONG TERM  GOAL #6   Title Patient will report having BMs at least every-other day with consistency between [facility] stool scale 3-5 over the prior week to demonstrate decreased constipation.    Baseline Pt. unable to have regula BM's without medication, manometry shows PFM dysfunction. As of 4/29: still having to use and enema or supository occasionally and still needing linzess. As of 6/10: Pt. has days where she can empty ok and days where she needs to use an Enema to stimulate a BM, feels that it is just the insertion of the enema, not the solution that does the job.    Time 10    Period Weeks    Status On-going    Target Date 12/30/19                 Plan - 01/29/20 1011    Clinical Impression Statement Pt. Responded well to all interventions today, demonstrating improved PFM relaxation and ability to recruit/coordinate PFM from 0/5 to 1/5 with MAX VC, TC, as well as understanding and correct performance of all education and exercises provided  today. They will continue to benefit from skilled physical therapy to work toward remaining goals and maximize function as well as decrease likelihood of symptom increase or recurrence.    PT Next Visit Plan Ask about Jonette Eva use and results of urodynamics study/appointment, MFR to lower abdomen, Review/ give new pain diary, cupping to medial thighs and upper back, re-assess neural glides on LLE, focus on graded activity, and seated and standing balance/core strength. review I's Y's and T's in prone, thoracic mobility? perform further release and PNF for R>L shoulder blades, more scar-release and cupping at suprapubic scar, MFR around B ilium. continue to work on thoracolumbar rotation ROM and decreasing spasms, add SLS balance training, .    PT Home Exercise Plan A day: low back stretch, side-stretch, hamstring stretch, butterfly stretch, hip EXT in prone with knee bent, side-lying hip ABD, calf raises. B day: Chest-stretch, Thoracic extensions over a towel roll,  seated rows and seated scapular retraction (pull shoulder blades together like "T's") with band, child's pose stretch, self internal TP release (especially when Pelvic floor spasms high), Every day: Pain diary, Posterior pelvic tilts with deep core activation and red band for obliques activation, bow-and-arrow, TENS unit 3+ times per day for 30 min and as needed if pain starts to increase. TENS at ASIS/PSIS at strong tingle for 1 hr. per day. Sleeper stretch PRN    Consulted and Agree with Plan of Care Patient           Patient will benefit from skilled therapeutic intervention in order to improve the following deficits and impairments:     Visit Diagnosis: Other muscle spasm  Muscle weakness (generalized)  Difficulty in walking, not elsewhere classified     Problem List Patient Active Problem List   Diagnosis Date Noted   Seizure ([place]) 11/11/2018   Major depressive disorder, recurrent episode, moderate ([place]) 02/07/2018   Nephrolithiasis 04/16/2016   Numbness 07/28/2015   Bladder retention 06/23/2015   Abdominal pain 06/04/2015   Dizziness 05/18/2015   Neck pain 59/74/7185   Complicated migraine 50/15/8682   Other fatigue 04/28/2015   Abnormal finding on MRI of brain 04/28/2015   D (diarrhea) 03/29/2015   H/O disease 03/29/2015   Abnormal weight loss 03/29/2015   Muscle weakness (generalized) 03/14/2015   Headache, migraine 03/10/2015   Willa Rough DPT, ATC Willa Rough 01/30/2020, 12:31 PM  [place] MAIN [facility] SERVICES [address] [place], [place], [address] Phone: [phone]   Fax:  [phone]  Name: Christine Chavez MRN: [id number] Date of Birth: November 01, 1980

## 2020-02-05 ENCOUNTER — Ambulatory Visit: Payer: BC Managed Care – PPO

## 2020-02-10 ENCOUNTER — Ambulatory Visit: Payer: BC Managed Care – PPO

## 2020-02-10 ENCOUNTER — Other Ambulatory Visit: Payer: Self-pay

## 2020-02-10 DIAGNOSIS — R262 Difficulty in walking, not elsewhere classified: Secondary | ICD-10-CM

## 2020-02-10 DIAGNOSIS — M62838 Other muscle spasm: Secondary | ICD-10-CM | POA: Diagnosis not present

## 2020-02-10 DIAGNOSIS — M6281 Muscle weakness (generalized): Secondary | ICD-10-CM

## 2020-02-10 NOTE — Therapy (Signed)
[place] [facility] SERVICES [address] [place], [place], [address] Phone: [phone]   Fax:  [phone]  Physical Therapy Treatment  The patient has been informed of current processes in place at Outpatient Rehab to protect patients from Covid-19 exposure including social distancing, schedule modifications, and new cleaning procedures. After discussing their particular risk with a therapist based on the patient's personal risk factors, the patient has decided to proceed with in-person therapy.  Patient Details  Name: Christine Chavez MRN: [id number] Date of Birth: 05-06-1980 Referring Provider (PT): [name]   Encounter Date: 02/10/2020   PT End of Session - 02/10/20 1138    Visit Number 48    Number of Visits 94    Date for PT Re-Evaluation 12/30/19    Authorization Type [employer]    Authorization Time Period from 10/22/2019 through 12/30/2019    Authorization - Visit Number 51    Authorization - Number of Visits 20    Progress Note Due on Visit 66    PT Start Time 1030    PT Stop Time 1130    PT Time Calculation (min) 60 min    Activity Tolerance Patient tolerated treatment well;No increased pain    Behavior During Therapy WFL for tasks assessed/performed           Past Medical History:  Diagnosis Date  . Complication of anesthesia    ? seizures after anesthesia   . Headache   . Migraines   . Neurogenic bladder   . Renal disorder   . Vision abnormalities     Past Surgical History:  Procedure Laterality Date  . ANTERIOR CRUCIATE LIGAMENT REPAIR  1997  . APPENDECTOMY    . COLONOSCOPY WITH PROPOFOL N/A 11/11/2018   Procedure: COLONOSCOPY WITH PROPOFOL;  Surgeon: [name], MD;  Location: [facility] ENDOSCOPY;  Service: Endoscopy;  Laterality: N/A;  . CYSTOSCOPY WITH STENT PLACEMENT Right 04/17/2016   Procedure: CYSTOSCOPY WITH STENT PLACEMENT;  Surgeon: [name], MD;  Location: [facility] ORS;  Service:  Urology;  Laterality: Right;  . ESOPHAGOGASTRODUODENOSCOPY (EGD) WITH PROPOFOL N/A 11/11/2018   Procedure: ESOPHAGOGASTRODUODENOSCOPY (EGD) WITH PROPOFOL;  Surgeon: [name], MD;  Location: [facility] ENDOSCOPY;  Service: Endoscopy;  Laterality: N/A;  . EXPLORATORY LAPAROTOMY  1999  . KIDNEY STONE SURGERY  04/2016  . REVISION UROSTOMY CUTANEOUS    . REVISION UROSTOMY CUTANEOUS  01/10/2018  . SUPRAPUBIC CATHETER PLACEMENT  08/2017  . TONSILLECTOMY      There were no vitals filed for this visit.  Pelvic Floor Physical Therapy Treatment Note  SCREENING  Changes in medications, allergies, or medical history?: urodynamics study  SUBJECTIVE  Patient reports: She had her urodynamics study again and they had to put a balloon catheter into her ostomy site to allow for it. She did not feel well afterward and was not able to do the test standing up. They were able to tell that she is not getting urine backing up into the kidneys. She was told that if her BP is in the toilet, she has a fever and she has blood in her urine is the point that they should do a urine culture. Had to use Oxybutynin several times on her trip to [place].  Started taking Uqora on Saturday after last visit (16th of October).   Precautions:  Neurological diagnosis pending, has "non-specific" brain lesions   Pain update: (numbers not updated due to Pt. Difficulty describing sensation) Location of pain: B Flank  pain (urethral) Current pain:  5/10(5/10) Max pain: 8/10 (9/10 when it spasms) Least pain:  3/10 (4/10) Nature of pain: sharp to dull ache (grabbing)  *decreased spasm and TTP to rectus abdominus following treatment.  Patient Goals: Get rid of the discomfort with catheterizing and ache/bladder pain. Be able to take fewer medications and be able to enjoy spending time with her family again, doing "all the mom things"    OBJECTIVE  Changes in:  Observations/Posture:  L lumbar curve prominent with  transverse processes more prominent on the L. (from previous note)   Range of Motion/Flexibilty:  (from 12/10) Spine: ~ 2-3 fingers from knee with SB B. ~ 50% reduced R rotation with stretch in L, ~ 75% reduced L rotation with increased tightness. HS: able to achieve 90 deg. Hip flexion in seated HS stretch. (from prior treatment)   Strength/MMT:  LE MMT (from 12-10) LE MMT Left Right  Hip flex:  (L2) /5 /5  Hip ext: 3+/5 3+/5  Hip abd: 4/5 4/5  Hip add: 4/5 3+/5  Hip IR 4/5 3+/5  Hip ER 4/5 4/5   (From 3/24): Pt. Demonstrates 3/5 strength with "I's, Y's, and T's" but 4/5 with W's. (from 6/22) DF: 9 reps with B. Eversion/Inversion: 4/5 on R, 4+/5 on L. DF: L 4+/5, R 3+/5  Seated IR/ER on L: 4/5, R 3+/5 (from 8-5) L DF and Eversion 4+/5 with first 1-3 reps and fatigue decreased to 4/4. (9/9) Pt. Demonstrates ~ 4+/5 B with increased spasticity B.   Balance/stability:  (6-29) Pt. Able to achieve good posture without cueing but only able to maintain for ~ 2.5 min. Before seeking stability from the table and forward rolled shoulders. With cue to engage the deep core, Pt. Recognizes increased stability and comfort.  8/5: Pt. Demonstrating decreased stability in gait and standing balance today with fasciculations increasing in direct relation to muscular endurance required. Able to stand ~ 10 sec. In SLS with MIN UE support before intensity of spasms make attempt at balance futile.    Pelvic Floor External Exam: (From prior session)- [Introitus Appears: elevated Skin integrity: WNL Palpation: TTP to B STP Cough: limited motion Prolapse visible?: no Scar mobility: not assessed  Internal Vaginal Exam: Strength (PERF): Pt. Has difficulty sensing the PFM to generate contraction or relaxation due to high tone/neuro involvement Symmetry: L>R for spasm/TTP Palpation: TTP to all muscles throughout B Prolapse: none]  TODAY: unable to coordinate squeeze and TTP to all areas with the  greatest at B OI and coccygeus.  ** following TP release Pt. Able to achieve 1/5 PFM contraction. Decreased TTP at B anterior PR/PC following treatment.  Abdominal:  Pt. Demonstrated ability to recruit R>L obliques and TA with pelvic tilts. (from prior session)  Palpation: Significant today that B iliacus and Psoas were not TTP.  TTP to B rectus abdominus with radiating pain to diaphragm, ostomy site, and contralateral rectus.  Gait Analysis:  INTERVENTIONS THIS SESSION:  Self-care: discussed results of recent urodynamics study and plan to see Uro-gynecology on Friday as well as how the Jonette Eva is working thus far.  Manual: Performed TP release and STM to B Rectus abdominus followed by scar release in a cephalic and L lateral direction to the suprapubic scar to decrease spasm and pain and allow for improved balance of musculature for improved function and decreased symptoms.  Dry-needle: Performed TPDN with a .30x79m needle to B Rectus abdominus near origin to decrease spasm and pain and allow for improved balance  of musculature, improved ease of breathing, for improved function and decreased symptoms.   Total time: 60 min.                                PT Short Term Goals - 10/21/19 1408      PT SHORT TERM GOAL #1   Title Patient will demonstrate coordinated diaphragmatic breathing with pelvic tilts to demonstrate improved control of diaphragm and TA, to allow for further strengthening of core musculature and decreased pelvic floor spasm.    Baseline Pt. demonstrates breathing dysfunction and poor PFM coordination evidenced by anal manometry As of 2/22: Pt. continues to have restriction in her diaphragm and near T/L junction but is able to intentionally use diaphragmatic breathing through the available ROM.    Time 5    Period Weeks    Status Achieved    Target Date 04/20/19      PT SHORT TERM GOAL #2   Title Patient will report a reduction in pain to  no greater than 6/10 over the prior week to demonstrate symptom improvement.    Baseline Pain is 10/10 at worst, 2/10 at best As of 2/22: Pt. had achieved this goal for 1 week as of 1/21 but may have infection or other insult that caused pain to increase again. As of 4/29: Pain high of 8/10 but has been over-doing her activity over the past week. As of 6/10: Pt. was able to keep pain at 6 or below since prior visit by using TENS to help keep tension lower but has not had a full week to test if it will keep working. As of 7/6: Pt. had met this goal but has a new UTI and it caused pain to spike to 8/10 over the weekend.    Time 5    Period Weeks    Status On-going    Target Date 10/23/19      PT SHORT TERM GOAL #3   Title Patient will demonstrate HEP x1 in the clinic to demonstrate understanding and proper form to allow for further improvement.    Baseline Pt. lacks knowledge of therepeutic exercises that can decrease her pain/Sx.    Time 5    Period Weeks    Status Achieved    Target Date 04/20/19      PT SHORT TERM GOAL #4   Title Patient will report consistent use of foot-stool (squatty-potty) for positioning with BM to decrease pain with BM and intra-abdominal pressure.    Baseline Pt. having constipation due to PFM dysfunction    Time 5    Period Weeks    Status Achieved    Target Date 04/20/19             PT Long Term Goals - 10/21/19 1424      PT LONG TERM GOAL #1   Title Pt. will be able to participate in regular ADL's with least restrictive device without pain increasing greater than 2/10    Baseline Pt. limited in her ability to perform household duties by increased pain and fatigue. As of 4/29: Her legs have more endurance with walking before she gets a tremor. Still needs to sit down the majority of the time she is cooking. Still needs back support for prolonged sitting. As of 6/10: Pt. able to do for  ~1 hour before she needs to rest to prevent increased pain, pain is  averaging ~ 4-5/10  Time 10    Period Weeks    Status On-going    Target Date 12/30/19      PT LONG TERM GOAL #2   Title Patient will score at or below 65/300  on the PFDI and 35% on the Female NIH-CPSI to demonstrate a clinically meaningful decrease in disability and distress due to pelvic floor dysfunction.    Baseline PFDI: 110/300, Female NIH-CPSI: 29/43 (67%) As of 4/29: 100/300 on PFDI and 30/43 on NIH-CPSI As of 7/6:  NIH-CPSI: 32/43 POPDI: 61/100, CRADI-8: 29/100, UDI: 44/100 (PFDI: 149/300)    Time 10    Period Weeks    Status On-going    Target Date 12/30/19      PT LONG TERM GOAL #3   Title Patient will report no pain with intercourse to demonstrate improved functional ability.    Baseline Pt. Having significant pain with intercourse. As of 4/29: is a little better (20-30%) but is worse on days where overall tension/pain is worse. As of 6/10: can have days where there is not much pain but other days where there is significant pain.    Time 10    Period Weeks    Status On-going      PT LONG TERM GOAL #4   Title Pt will report ability to work in her yard for greater than 30 minutes without extreme fatigue    Baseline limited to 10-15 minutes before pt requires ending activity. As of 4/29: ~ 20 min. As of 6/10: has not been gardening much but can do ~ 30 min of similar indoor activity level before increased fatigue.    Time 10    Period Weeks    Status Achieved    Target Date 10/23/19      PT LONG TERM GOAL #5   Title Pt. will be able to go 2-3 hours between emptying her bladder without increased pain and empty her bladder fully whether by urostomy, cath, or voluntary release.    Baseline Pt. has a urostomy but continues to have to self-catheterize. Has pain with bladder filling and when using catheter. As of 4/29: is doing better since starting to take the pregabalin andd baclofen but occasionally still has pain with bladder filling. It is bad when she has an infection or  over-does activity, better on "normal" days which have been few and far between due to frequent UTI's. As of 6/10: Pt. has "good" and "bad" days but has more days where she can go 2-3 hours without increased pain    Time 10    Period Weeks    Status On-going    Target Date 12/30/19      PT LONG TERM GOAL #6   Title Patient will report having BM's at least every-other day with consistency between [facility] stool scale 3-5 over the prior week to demonstrate decreased constipation.    Baseline Pt. unable to have regula BM's without medication, manometry shows PFM dysfunction. As of 4/29: still having to use and enema or supository occasionally and still needing linzess. As of 6/10: Pt. has days where she can empty ok and days where she needs to use an Enema to stimulate a BM, feels that it is just the insertion of the enema, not the solution that does the job.    Time 10    Period Weeks    Status On-going    Target Date 12/30/19  Plan - 02/10/20 1139    Clinical Impression Statement Pt. Responded well to all interventions today, demonstrating decreased spasms and TTP through B rectus abdominus as well as continued non TTP to B hip-flexors and improved scar mobility at suprapubic scar, understanding and correct performance of all education and exercises provided today. They will continue to benefit from skilled physical therapy to work toward remaining goals and maximize function as well as decrease likelihood of symptom increase or recurrence.    PT Next Visit Plan Ask about appointment with urogynecology and Jonette Eva use and results of urodynamics study/appointment, MFR to lower abdomen, Review/ give new pain diary, cupping to medial thighs and upper back, re-assess neural glides on LLE, focus on graded activity, and seated and standing balance/core strength. review I's Y's and T's in prone, thoracic mobility? perform further release and PNF for R>L shoulder blades, more scar-release  and cupping at suprapubic scar, MFR around B ilium. continue to work on thoracolumbar rotation ROM and decreasing spasms, add SLS balance training, .    PT Home Exercise Plan A day: low back stretch, side-stretch, hamstring stretch, butterfly stretch, hip EXT in prone with knee bent, side-lying hip ABD, calf raises. B day: Chest-stretch, Thoracic extensions over a towel roll, seated rows and seated scapular retraction (pull shoulder blades together like "T's") with band, child's pose stretch, self internal TP release (especially when Pelvic floor spasms high), Every day: Pain diary, Posterior pelvic tilts with deep core activation and red band for obliques activation, bow-and-arrow, TENS unit 3+ times per day for 30 min and as needed if pain starts to increase. TENS at ASIS/PSIS at strong tingle for 1 hr. per day. Sleeper stretch PRN    Consulted and Agree with Plan of Care Patient           Patient will benefit from skilled therapeutic intervention in order to improve the following deficits and impairments:     Visit Diagnosis: Other muscle spasm  Muscle weakness (generalized)  Difficulty in walking, not elsewhere classified     Problem List Patient Active Problem List   Diagnosis Date Noted  . Seizure ([place]) 11/11/2018  . Major depressive disorder, recurrent episode, moderate ([place]) 02/07/2018  . Nephrolithiasis 04/16/2016  . Numbness 07/28/2015  . Bladder retention 06/23/2015  . Abdominal pain 06/04/2015  . Dizziness 05/18/2015  . Neck pain 05/18/2015  . Complicated migraine [id number]/45/3646  . Other fatigue 04/28/2015  . Abnormal finding on MRI of brain 04/28/2015  . D (diarrhea) 03/29/2015  . H/O disease 03/29/2015  . Abnormal weight loss 03/29/2015  . Muscle weakness (generalized) 03/14/2015  . Headache, migraine 03/10/2015   Willa Rough DPT, ATC Willa Rough 02/10/2020, 11:40 AM  [place] [facility] SERVICES [address]  [name], [place], [address] Phone: [phone]   Fax:  [phone]  Name: Christine Chavez MRN: [id number] Date of Birth: 1980/12/27

## 2020-02-12 ENCOUNTER — Other Ambulatory Visit: Payer: Self-pay

## 2020-02-12 ENCOUNTER — Ambulatory Visit: Payer: BC Managed Care – PPO

## 2020-02-12 DIAGNOSIS — M62838 Other muscle spasm: Secondary | ICD-10-CM

## 2020-02-12 DIAGNOSIS — M6281 Muscle weakness (generalized): Secondary | ICD-10-CM

## 2020-02-12 DIAGNOSIS — R262 Difficulty in walking, not elsewhere classified: Secondary | ICD-10-CM

## 2020-02-12 NOTE — Therapy (Signed)
[place] MAIN [facility] SERVICES [address] [place], [place], [address] Phone: [phone]   Fax:  [phone]  Physical Therapy Treatment  The patient has been informed of current processes in place at Outpatient Rehab to protect patients from Covid-19 exposure including social distancing, schedule modifications, and new cleaning procedures. After discussing their particular risk with a therapist based on the patient's personal risk factors, the patient has decided to proceed with in-person therapy.  Patient Details  Name: Christine Chavez MRN: [id number] Date of Birth: 06/15/1980 Referring Provider (PT): [name]   Encounter Date: 02/12/2020   PT End of Session - 02/12/20 1213    Visit Number [id number]    Number of Visits 38    Date for PT Re-Evaluation 12/30/19    Authorization Type [employer]    Authorization Time Period from 10/22/2019 through 12/30/2019    Authorization - Visit Number 18    Authorization - Number of Visits 20    Progress Note Due on Visit 50    PT Start Time 0930    PT Stop Time 1030    PT Time Calculation (min) 60 min    Activity Tolerance Patient tolerated treatment well;No increased pain    Behavior During Therapy WFL for tasks assessed/performed           Past Medical History:  Diagnosis Date  . Complication of anesthesia    ? seizures after anesthesia   . Headache   . Migraines   . Neurogenic bladder   . Renal disorder   . Vision abnormalities     Past Surgical History:  Procedure Laterality Date  . ANTERIOR CRUCIATE LIGAMENT REPAIR  1997  . APPENDECTOMY    . COLONOSCOPY WITH PROPOFOL N/A 11/11/2018   Procedure: COLONOSCOPY WITH PROPOFOL;  Surgeon: [name], MD;  Location: [facility] ENDOSCOPY;  Service: Endoscopy;  Laterality: N/A;  . CYSTOSCOPY WITH STENT PLACEMENT Right 04/17/2016   Procedure: CYSTOSCOPY WITH STENT PLACEMENT;  Surgeon: [name], MD;  Location: [facility] ORS;  Service:  Urology;  Laterality: Right;  . ESOPHAGOGASTRODUODENOSCOPY (EGD) WITH PROPOFOL N/A 11/11/2018   Procedure: ESOPHAGOGASTRODUODENOSCOPY (EGD) WITH PROPOFOL;  Surgeon: [name], MD;  Location: [facility] ENDOSCOPY;  Service: Endoscopy;  Laterality: N/A;  . EXPLORATORY LAPAROTOMY  1999  . KIDNEY STONE SURGERY  04/2016  . REVISION UROSTOMY CUTANEOUS    . REVISION UROSTOMY CUTANEOUS  01/10/2018  . SUPRAPUBIC CATHETER PLACEMENT  08/2017  . TONSILLECTOMY      There were no vitals filed for this visit.  Pelvic Floor Physical Therapy Treatment Note  SCREENING  Changes in medications, allergies, or medical history?: urodynamics study  SUBJECTIVE  Patient reports: She started her period on Tuesday, had a Funeral yesterday and is having a rough time today. Has less tenderness from last treatment but still has increased tenderness at the scar. Still having a lot of foul smelling urine and mucous-like white substance in her urostomy bag.  Precautions:  Neurological diagnosis pending, has "non-specific" brain lesions   Pain update: (numbers not updated due to Pt. Difficulty describing sensation) Location of pain: B Flank pain (bladder Current pain:  5/10(5/10) Max pain: 7/10 (7/10 when it spasms) Least pain:  3/10 (4/10) Nature of pain: sharp to dull ache (grabbing)  *decreased spasm and TTP to rectus abdominus following treatment.  Patient Goals: Get rid of the discomfort with catheterizing and ache/bladder pain. Be able to take fewer medications and be able to enjoy spending time with  her family again, doing "all the mom things"    OBJECTIVE  Changes in:  Observations/Posture:  L lumbar curve prominent with transverse processes more prominent on the L. (from previous note)   Range of Motion/Flexibilty:  (from 12/10) Spine: ~ 2-3 fingers from knee with SB B. ~ 50% reduced R rotation with stretch in L, ~ 75% reduced L rotation with increased tightness. HS: able to achieve 90  deg. Hip flexion in seated HS stretch. (from prior treatment)   Strength/MMT:  LE MMT (from 12-10) LE MMT Left Right  Hip flex:  (L2) /5 /5  Hip ext: 3+/5 3+/5  Hip abd: 4/5 4/5  Hip add: 4/5 3+/5  Hip IR 4/5 3+/5  Hip ER 4/5 4/5   (From 3/24): Pt. Demonstrates 3/5 strength with "I's, Y's, and T's" but 4/5 with W's. (from 6/22) DF: 9 reps with B. Eversion/Inversion: 4/5 on R, 4+/5 on L. DF: L 4+/5, R 3+/5  Seated IR/ER on L: 4/5, R 3+/5 (from 8-5) L DF and Eversion 4+/5 with first 1-3 reps and fatigue decreased to 4/4. (9/9) Pt. Demonstrates ~ 4+/5 B with increased spasticity B.   Balance/stability:  (6-29) Pt. Able to achieve good posture without cueing but only able to maintain for ~ 2.5 min. Before seeking stability from the table and forward rolled shoulders. With cue to engage the deep core, Pt. Recognizes increased stability and comfort.  8/5: Pt. Demonstrating decreased stability in gait and standing balance today with fasciculations increasing in direct relation to muscular endurance required. Able to stand ~ 10 sec. In SLS with MIN UE support before intensity of spasms make attempt at balance futile.    Pelvic Floor External Exam: (From prior session)- [Introitus Appears: elevated Skin integrity: WNL Palpation: TTP to B STP Cough: limited motion Prolapse visible?: no Scar mobility: not assessed  Internal Vaginal Exam: Strength (PERF): Pt. Has difficulty sensing the PFM to generate contraction or relaxation due to high tone/neuro involvement Symmetry: L>R for spasm/TTP Palpation: TTP to all muscles throughout B Prolapse: none]  TODAY: unable to coordinate squeeze and TTP to all areas with the greatest at B OI and coccygeus.  ** following TP release Pt. Able to achieve 1/5 PFM contraction. Decreased TTP at B anterior PR/PC following treatment.  Abdominal:  Pt. Demonstrated ability to recruit R>L obliques and TA with pelvic tilts. (from prior  session)  Palpation: Decreased fascial and scar mobility through lower abdomen with radiating Sx. To the pelvis and LB.  Gait Analysis:  INTERVENTIONS THIS SESSION:  Manual: Performed scar and myofascial release to B lower abdomen and to the suprapubic scar to decrease spasm and pain and allow for improved balance of musculature for improved function and decreased symptoms.  Dry-needle: Performed TPDN with a .30x85mm needle to scar from suprapubic catheter to improve scar tissue length and alignment for decreased pain and restriction on nerves to decrease Sx.   Total time: 60 min.                              PT Short Term Goals - 10/21/19 1408      PT SHORT TERM GOAL #1   Title Patient will demonstrate coordinated diaphragmatic breathing with pelvic tilts to demonstrate improved control of diaphragm and TA, to allow for further strengthening of core musculature and decreased pelvic floor spasm.    Baseline Pt. demonstrates breathing dysfunction and poor PFM coordination evidenced by anal manometry As  of 2/22: Pt. continues to have restriction in her diaphragm and near T/L junction but is able to intentionally use diaphragmatic breathing through the available ROM.    Time 5    Period Weeks    Status Achieved    Target Date 04/20/19      PT SHORT TERM GOAL #2   Title Patient will report a reduction in pain to no greater than 6/10 over the prior week to demonstrate symptom improvement.    Baseline Pain is 10/10 at worst, 2/10 at best As of 2/22: Pt. had achieved this goal for 1 week as of 1/21 but may have infection or other insult that caused pain to increase again. As of 4/29: Pain high of 8/10 but has been over-doing her activity over the past week. As of 6/10: Pt. was able to keep pain at 6 or below since prior visit by using TENS to help keep tension lower but has not had a full week to test if it will keep working. As of 7/6: Pt. had met this goal but has a  new UTI and it caused pain to spike to 8/10 over the weekend.    Time 5    Period Weeks    Status On-going    Target Date 10/23/19      PT SHORT TERM GOAL #3   Title Patient will demonstrate HEP x1 in the clinic to demonstrate understanding and proper form to allow for further improvement.    Baseline Pt. lacks knowledge of therepeutic exercises that can decrease her pain/Sx.    Time 5    Period Weeks    Status Achieved    Target Date 04/20/19      PT SHORT TERM GOAL #4   Title Patient will report consistent use of foot-stool (squatty-potty) for positioning with BM to decrease pain with BM and intra-abdominal pressure.    Baseline Pt. having constipation due to PFM dysfunction    Time 5    Period Weeks    Status Achieved    Target Date 04/20/19             PT Long Term Goals - 10/21/19 1424      PT LONG TERM GOAL #1   Title Pt. will be able to participate in regular ADL's with least restrictive device without pain increasing greater than 2/10    Baseline Pt. limited in her ability to perform household duties by increased pain and fatigue. As of 4/29: Her legs have more endurance with walking before she gets a tremor. Still needs to sit down the majority of the time she is cooking. Still needs back support for prolonged sitting. As of 6/10: Pt. able to do for  ~1 hour before she needs to rest to prevent increased pain, pain is averaging ~ 4-5/10    Time 10    Period Weeks    Status On-going    Target Date 12/30/19      PT LONG TERM GOAL #2   Title Patient will score at or below 65/300  on the PFDI and 35% on the Female NIH-CPSI to demonstrate a clinically meaningful decrease in disability and distress due to pelvic floor dysfunction.    Baseline PFDI: 110/300, Female NIH-CPSI: 29/43 (67%) As of 4/29: 100/300 on PFDI and 30/43 on NIH-CPSI As of 7/6:  NIH-CPSI: 32/43 POPDI: 61/100, CRADI-8: 29/100, UDI: 44/100 (PFDI: 149/300)    Time 10    Period Weeks    Status On-going  Target Date 12/30/19      PT LONG TERM GOAL #3   Title Patient will report no pain with intercourse to demonstrate improved functional ability.    Baseline Pt. Having significant pain with intercourse. As of 4/29: is a little better (20-30%) but is worse on days where overall tension/pain is worse. As of 6/10: can have days where there is not much pain but other days where there is significant pain.    Time 10    Period Weeks    Status On-going      PT LONG TERM GOAL #4   Title Pt will report ability to work in her yard for greater than 30 minutes without extreme fatigue    Baseline limited to 10-15 minutes before pt requires ending activity. As of 4/29: ~ 20 min. As of 6/10: has not been gardening much but can do ~ 30 min of similar indoor activity level before increased fatigue.    Time 10    Period Weeks    Status Achieved    Target Date 10/23/19      PT LONG TERM GOAL #5   Title Pt. will be able to go 2-3 hours between emptying her bladder without increased pain and empty her bladder fully whether by urostomy, cath, or voluntary release.    Baseline Pt. has a urostomy but continues to have to self-catheterize. Has pain with bladder filling and when using catheter. As of 4/29: is doing better since starting to take the pregabalin andd baclofen but occasionally still has pain with bladder filling. It is bad when she has an infection or over-does activity, better on "normal" days which have been few and far between due to frequent UTI's. As of 6/10: Pt. has "good" and "bad" days but has more days where she can go 2-3 hours without increased pain    Time 10    Period Weeks    Status On-going    Target Date 12/30/19      PT LONG TERM GOAL #6   Title Patient will report having BM's at least every-other day with consistency between [facility] stool scale 3-5 over the prior week to demonstrate decreased constipation.    Baseline Pt. unable to have regula BM's without medication, manometry shows  PFM dysfunction. As of 4/29: still having to use and enema or supository occasionally and still needing linzess. As of 6/10: Pt. has days where she can empty ok and days where she needs to use an Enema to stimulate a BM, feels that it is just the insertion of the enema, not the solution that does the job.    Time 10    Period Weeks    Status On-going    Target Date 12/30/19                 Plan - 02/12/20 1213    Clinical Impression Statement Pt. Responded well to all interventions today, demonstrating improved scar tissue and fascial mobility through the lower abdomen as well as understanding and correct performance of all education and exercises provided today. They will continue to benefit from skilled physical therapy to work toward remaining goals and maximize function as well as decrease likelihood of symptom increase or recurrence.    PT Next Visit Plan TP release and fascial release to B paraspinals and multifidus followed by deep-core and balance Ask about appointment with urogynecology and Jonette Eva use and results of urodynamics study/appointment, MFR to lower abdomen, Review/ give new pain diary, cupping to  medial thighs and upper back, re-assess neural glides on LLE, focus on graded activity, and seated and standing balance/core strength. review I's Y's and T's in prone, thoracic mobility? perform further release and PNF for R>L shoulder blades, more scar-release and cupping at suprapubic scar, MFR around B ilium. continue to work on thoracolumbar rotation ROM and decreasing spasms, add SLS balance training, .    PT Home Exercise Plan A day: low back stretch, side-stretch, hamstring stretch, butterfly stretch, hip EXT in prone with knee bent, side-lying hip ABD, calf raises. B day: Chest-stretch, Thoracic extensions over a towel roll, seated rows and seated scapular retraction (pull shoulder blades together like "T's") with band, child's pose stretch, self internal TP release (especially  when Pelvic floor spasms high), Every day: Pain diary, Posterior pelvic tilts with deep core activation and red band for obliques activation, bow-and-arrow, TENS unit 3+ times per day for 30 min and as needed if pain starts to increase. TENS at ASIS/PSIS at strong tingle for 1 hr. per day. Sleeper stretch PRN    Consulted and Agree with Plan of Care Patient           Patient will benefit from skilled therapeutic intervention in order to improve the following deficits and impairments:     Visit Diagnosis: Other muscle spasm  Muscle weakness (generalized)  Difficulty in walking, not elsewhere classified     Problem List Patient Active Problem List   Diagnosis Date Noted  . Seizure ([place]) 11/11/2018  . Major depressive disorder, recurrent episode, moderate ([place]) 02/07/2018  . Nephrolithiasis 04/16/2016  . Numbness 07/28/2015  . Bladder retention 06/23/2015  . Abdominal pain 06/04/2015  . Dizziness 05/18/2015  . Neck pain 05/18/2015  . Complicated migraine 86/[id number]/[id number]  . Other fatigue 04/28/2015  . Abnormal finding on MRI of brain 04/28/2015  . D (diarrhea) 03/29/2015  . H/O disease 03/29/2015  . Abnormal weight loss 03/29/2015  . Muscle weakness (generalized) 03/14/2015  . Headache, migraine 03/10/2015   Willa Rough DPT, ATC Willa Rough 02/12/2020, 12:15 PM  [place] MAIN [facility] SERVICES [address] [place], [place], [address] Phone: [phone]   Fax:  [phone]  Name: Vilda Zollner MRN: [id number] Date of Birth: [birth year]

## 2020-02-17 ENCOUNTER — Ambulatory Visit: Payer: BC Managed Care – PPO | Attending: Gastroenterology

## 2020-02-17 ENCOUNTER — Other Ambulatory Visit: Payer: Self-pay

## 2020-02-17 DIAGNOSIS — M62838 Other muscle spasm: Secondary | ICD-10-CM | POA: Diagnosis present

## 2020-02-17 DIAGNOSIS — M6281 Muscle weakness (generalized): Secondary | ICD-10-CM | POA: Insufficient documentation

## 2020-02-17 DIAGNOSIS — R262 Difficulty in walking, not elsewhere classified: Secondary | ICD-10-CM | POA: Diagnosis present

## 2020-02-17 NOTE — Therapy (Signed)
[place] MAIN [facility] SERVICES [address] [place], [place], [address] Phone: [phone]   Fax:  [phone]  Physical Therapy Treatment  The patient has been informed of current processes in place at Outpatient Rehab to protect patients from Covid-19 exposure including social distancing, schedule modifications, and new cleaning procedures. After discussing their particular risk with a therapist based on the patient's personal risk factors, the patient has decided to proceed with in-person therapy.  Patient Details  Name: Christine Chavez MRN: [id number] Date of Birth: [birth year] Referring Provider (PT): [name]   Encounter Date: 02/17/2020   PT End of Session - 02/17/20 1019    Visit Number 7    Number of Visits 55    Date for PT Re-Evaluation 12/30/19    Authorization Type BCBS    Authorization Time Period from 10/22/2019 through 12/30/2019    Authorization - Visit Number 68    Authorization - Number of Visits 20    Progress Note Due on Visit 60    PT Start Time 1020    PT Stop Time 1120    PT Time Calculation (min) 60 min    Activity Tolerance Patient tolerated treatment well;No increased pain    Behavior During Therapy WFL for tasks assessed/performed           Past Medical History:  Diagnosis Date  . Complication of anesthesia    ? seizures after anesthesia   . Headache   . Migraines   . Neurogenic bladder   . Renal disorder   . Vision abnormalities     Past Surgical History:  Procedure Laterality Date  . ANTERIOR CRUCIATE LIGAMENT REPAIR  1997  . APPENDECTOMY    . COLONOSCOPY WITH PROPOFOL N/A 11/11/2018   Procedure: COLONOSCOPY WITH PROPOFOL;  Surgeon: [name], MD;  Location: [facility] ENDOSCOPY;  Service: Endoscopy;  Laterality: N/A;  . CYSTOSCOPY WITH STENT PLACEMENT Right 04/17/2016   Procedure: CYSTOSCOPY WITH STENT PLACEMENT;  Surgeon: [name], MD;  Location: [facility] ORS;  Service:  Urology;  Laterality: Right;  . ESOPHAGOGASTRODUODENOSCOPY (EGD) WITH PROPOFOL N/A 11/11/2018   Procedure: ESOPHAGOGASTRODUODENOSCOPY (EGD) WITH PROPOFOL;  Surgeon: [name], MD;  Location: [facility] ENDOSCOPY;  Service: Endoscopy;  Laterality: N/A;  . EXPLORATORY LAPAROTOMY  1999  . KIDNEY STONE SURGERY  04/2016  . REVISION UROSTOMY CUTANEOUS    . REVISION UROSTOMY CUTANEOUS  01/10/2018  . SUPRAPUBIC CATHETER PLACEMENT  08/2017  . TONSILLECTOMY      There were no vitals filed for this visit.   Pelvic Floor Physical Therapy Treatment Note  SCREENING  Changes in medications, allergies, or medical history?: Is on a new antibiotic that is in a powder form (Monurol) and a new maintenance medication (Hiprex) that is not an antibiotic. Also given a prescription for vaginal valium.   SUBJECTIVE  Patient reports: She feels like the appointment with urogynecology went well. Talked to her GP about her concern with having conversion disorder on her chart and she tried to remove it from her chart. Her Primary care MD is leaving but is trying to get her set up with a good fit for her.   Precautions:  Neurological diagnosis pending, has "non-specific" brain lesions   Pain update: (numbers not updated due to Pt. Difficulty describing sensation) Location of pain: B Flank pain (bladder Current pain:  5/10(6/10) Max pain: 8/10 (8/10 when it spasms) Least pain:  3/10 (4/10) Nature of pain: sharp to dull ache (grabbing)  *  decreased spasm and TTP to B erector spinae and multifidus following treatment, some soreness from treatment.  Patient Goals: Get rid of the discomfort with catheterizing and ache/bladder pain. Be able to take fewer medications and be able to enjoy spending time with her family again, doing "all the mom things"    OBJECTIVE  Changes in:  Observations/Posture:  L lumbar curve prominent with transverse processes more prominent on the L. (from previous note)   Range  of Motion/Flexibilty:  (from 12/10) Spine: ~ 2-3 fingers from knee with SB B. ~ 50% reduced R rotation with stretch in L, ~ 75% reduced L rotation with increased tightness. HS: able to achieve 90 deg. Hip flexion in seated HS stretch. (from prior treatment)   Strength/MMT:  LE MMT (from 12-10) LE MMT Left Right  Hip flex:  (L2) /5 /5  Hip ext: 3+/5 3+/5  Hip abd: 4/5 4/5  Hip add: 4/5 3+/5  Hip IR 4/5 3+/5  Hip ER 4/5 4/5   (From 3/24): Pt. Demonstrates 3/5 strength with "I's, Y's, and T's" but 4/5 with W's. (from 6/22) DF: 9 reps with B. Eversion/Inversion: 4/5 on R, 4+/5 on L. DF: L 4+/5, R 3+/5  Seated IR/ER on L: 4/5, R 3+/5 (from 8-5) L DF and Eversion 4+/5 with first 1-3 reps and fatigue decreased to 4/4. (9/9) Pt. Demonstrates ~ 4+/5 B with increased spasticity B.   Balance/stability:  (6-29) Pt. Able to achieve good posture without cueing but only able to maintain for ~ 2.5 min. Before seeking stability from the table and forward rolled shoulders. With cue to engage the deep core, Pt. Recognizes increased stability and comfort.  8/5: Pt. Demonstrating decreased stability in gait and standing balance today with fasciculations increasing in direct relation to muscular endurance required. Able to stand ~ 10 sec. In SLS with MIN UE support before intensity of spasms make attempt at balance futile.    Pelvic Floor External Exam: (From prior session)- [Introitus Appears: elevated Skin integrity: WNL Palpation: TTP to B STP Cough: limited motion Prolapse visible?: no Scar mobility: not assessed  Internal Vaginal Exam: Strength (PERF): Pt. Has difficulty sensing the PFM to generate contraction or relaxation due to high tone/neuro involvement Symmetry: L>R for spasm/TTP Palpation: TTP to all muscles throughout B Prolapse: none]  unable to coordinate squeeze and TTP to all areas with the greatest at B OI and coccygeus. ** following TP release Pt. Able to achieve 1/5 PFM  contraction. Decreased TTP at B anterior PR/PC following treatment. (10/14).  TODAY: not re-assessed today.  Abdominal:  Pt. Demonstrated ability to recruit R>L obliques and TA with pelvic tilts. (from prior session)  Palpation: Decreased fascial and scar mobility through lower abdomen with radiating Sx. To the pelvis and LB. (from prior session)  Gait Analysis:  INTERVENTIONS THIS SESSION:  Manual: Performed TP release and STM to B paraspinals and multifidus near T-L junction and at B C6-7 as well as sub-occipitals followed by PA mobs from C7-T10 to decrease spasm and pain and allow for improved balance of musculature for improved function and decreased symptoms and to improve mobility of joint and surrounding connective tissue and decrease pressure on nerve roots for improved conductivity and function of down-stream tissues.   Dry-needle: Performed TPDN with a .30x20mm needle and standard approach as described below to decrease spasm and pain and allow for improved balance of musculature for improved function and decreased symptoms.  Therex: reviewed which exercises need to be prioritized to decrease back  pain, including I's, Y's and T's and chin-tucks to improve posture and help decrease straing and spasm surrounding the T/L junction.  Self-care: discussed the new POC from urogynecology. Discussed making sure she is doing her pain/fatigue diary and e-stim to help moderate overall pain and allow [place] to treat the chronic side of her pain with graded activity.  Total time: 60 min.                     Trigger Point Dry Needling - 02/17/20 0001    Consent Given? Yes    Education Handout Provided No    Muscles Treated Back/Hip Erector spinae;Thoracic multifidi    Erector spinae Response Twitch response elicited;Palpable increased muscle length    Thoracic multifidi response Twitch response elicited;Palpable increased muscle length                  PT Short Term  Goals - 10/21/19 1408      PT SHORT TERM GOAL #1   Title Patient will demonstrate coordinated diaphragmatic breathing with pelvic tilts to demonstrate improved control of diaphragm and TA, to allow for further strengthening of core musculature and decreased pelvic floor spasm.    Baseline Pt. demonstrates breathing dysfunction and poor PFM coordination evidenced by anal manometry As of 2/22: Pt. continues to have restriction in her diaphragm and near T/L junction but is able to intentionally use diaphragmatic breathing through the available ROM.    Time 5    Period Weeks    Status Achieved    Target Date 04/20/19      PT SHORT TERM GOAL #2   Title Patient will report a reduction in pain to no greater than 6/10 over the prior week to demonstrate symptom improvement.    Baseline Pain is 10/10 at worst, 2/10 at best As of 2/22: Pt. had achieved this goal for 1 week as of 1/21 but may have infection or other insult that caused pain to increase again. As of 4/29: Pain high of 8/10 but has been over-doing her activity over the past week. As of 6/10: Pt. was able to keep pain at 6 or below since prior visit by using TENS to help keep tension lower but has not had a full week to test if it will keep working. As of 7/6: Pt. had met this goal but has a new UTI and it caused pain to spike to 8/10 over the weekend.    Time 5    Period Weeks    Status On-going    Target Date 10/23/19      PT SHORT TERM GOAL #3   Title Patient will demonstrate HEP x1 in the clinic to demonstrate understanding and proper form to allow for further improvement.    Baseline Pt. lacks knowledge of therepeutic exercises that can decrease her pain/Sx.    Time 5    Period Weeks    Status Achieved    Target Date 04/20/19      PT SHORT TERM GOAL #4   Title Patient will report consistent use of foot-stool (squatty-potty) for positioning with BM to decrease pain with BM and intra-abdominal pressure.    Baseline Pt. having  constipation due to PFM dysfunction    Time 5    Period Weeks    Status Achieved    Target Date 04/20/19             PT Long Term Goals - 10/21/19 1424      PT  LONG TERM GOAL #1   Title Pt. will be able to participate in regular ADL's with least restrictive device without pain increasing greater than 2/10    Baseline Pt. limited in her ability to perform household duties by increased pain and fatigue. As of 4/29: Her legs have more endurance with walking before she gets a tremor. Still needs to sit down the majority of the time she is cooking. Still needs back support for prolonged sitting. As of 6/10: Pt. able to do for  ~1 hour before she needs to rest to prevent increased pain, pain is averaging ~ 4-5/10    Time 10    Period Weeks    Status On-going    Target Date 12/30/19      PT LONG TERM GOAL #2   Title Patient will score at or below 65/300  on the PFDI and 35% on the Female NIH-CPSI to demonstrate a clinically meaningful decrease in disability and distress due to pelvic floor dysfunction.    Baseline PFDI: 110/300, Female NIH-CPSI: 29/43 (67%) As of 4/29: 100/300 on PFDI and 30/43 on NIH-CPSI As of 7/6:  NIH-CPSI: 32/43 POPDI: 61/100, CRADI-8: 29/100, UDI: 44/100 (PFDI: 149/300)    Time 10    Period Weeks    Status On-going    Target Date 12/30/19      PT LONG TERM GOAL #3   Title Patient will report no pain with intercourse to demonstrate improved functional ability.    Baseline Pt. Having significant pain with intercourse. As of 4/29: is a little better (20-30%) but is worse on days where overall tension/pain is worse. As of 6/10: can have days where there is not much pain but other days where there is significant pain.    Time 10    Period Weeks    Status On-going      PT LONG TERM GOAL #4   Title Pt will report ability to work in her yard for greater than 30 minutes without extreme fatigue    Baseline limited to 10-15 minutes before pt requires ending activity. As of  4/29: ~ 20 min. As of 6/10: has not been gardening much but can do ~ 30 min of similar indoor activity level before increased fatigue.    Time 10    Period Weeks    Status Achieved    Target Date 10/23/19      PT LONG TERM GOAL #5   Title Pt. will be able to go 2-3 hours between emptying her bladder without increased pain and empty her bladder fully whether by urostomy, cath, or voluntary release.    Baseline Pt. has a urostomy but continues to have to self-catheterize. Has pain with bladder filling and when using catheter. As of 4/29: is doing better since starting to take the pregabalin andd baclofen but occasionally still has pain with bladder filling. It is bad when she has an infection or over-does activity, better on "normal" days which have been few and far between due to frequent UTI's. As of 6/10: Pt. has "good" and "bad" days but has more days where she can go 2-3 hours without increased pain    Time 10    Period Weeks    Status On-going    Target Date 12/30/19      PT LONG TERM GOAL #6   Title Patient will report having BM's at least every-other day with consistency between [facility] stool scale 3-5 over the prior week to demonstrate decreased constipation.  Baseline Pt. unable to have regula BM's without medication, manometry shows PFM dysfunction. As of 4/29: still having to use and enema or supository occasionally and still needing linzess. As of 6/10: Pt. has days where she can empty ok and days where she needs to use an Enema to stimulate a BM, feels that it is just the insertion of the enema, not the solution that does the job.    Time 10    Period Weeks    Status On-going    Target Date 12/30/19                 Plan - 02/17/20 1319    Clinical Impression Statement Pt. Responded well to all interventions today, demonstrating decreased spasm and TTP through erector spinae and multifidus with improved seated posture and report of feeling "looser" as well as  understanding and correct performance of all education and exercises provided today. They will continue to benefit from skilled physical therapy to work toward remaining goals and maximize function as well as decrease likelihood of symptom increase or recurrence.    PT Next Visit Plan deep-core, scapular tracking/strength, and balance Ask about appointment with urogynecology and Jonette Eva use and results of urodynamics study/appointment, MFR to lower abdomen, Review/ give new pain diary, cupping to medial thighs and upper back, re-assess neural glides on LLE, focus on graded activity, and seated and standing balance/core strength. review I's Y's and T's in prone, thoracic mobility? perform further release and PNF for R>L shoulder blades, more scar-release and cupping at suprapubic scar, MFR around B ilium. continue to work on thoracolumbar rotation ROM and decreasing spasms, add SLS balance training, .    PT Home Exercise Plan A day: low back stretch, side-stretch, hamstring stretch, butterfly stretch, hip EXT in prone with knee bent, side-lying hip ABD, calf raises. B day: Chest-stretch, Thoracic extensions over a towel roll, seated rows and seated scapular retraction (pull shoulder blades together like "T's") with band, child's pose stretch, self internal TP release (especially when Pelvic floor spasms high), Every day: Pain diary, Posterior pelvic tilts with deep core activation and red band for obliques activation, bow-and-arrow, TENS unit 3+ times per day for 30 min and as needed if pain starts to increase. TENS at ASIS/PSIS at strong tingle for 1 hr. per day. Sleeper stretch PRN    Consulted and Agree with Plan of Care Patient           Patient will benefit from skilled therapeutic intervention in order to improve the following deficits and impairments:     Visit Diagnosis: Other muscle spasm  Muscle weakness (generalized)  Difficulty in walking, not elsewhere classified     Problem  List Patient Active Problem List   Diagnosis Date Noted  . Seizure ([place]) 11/11/2018  . Major depressive disorder, recurrent episode, moderate ([place]) 02/07/2018  . Nephrolithiasis 04/16/2016  . Numbness 07/28/2015  . Bladder retention 06/23/2015  . Abdominal pain 06/04/2015  . Dizziness 05/18/2015  . Neck pain 05/18/2015  . Complicated migraine 47/65/4650  . Other fatigue 04/28/2015  . Abnormal finding on MRI of brain 04/28/2015  . D (diarrhea) 03/29/2015  . H/O disease 03/29/2015  . Abnormal weight loss 03/29/2015  . Muscle weakness (generalized) 03/14/2015  . Headache, migraine 03/10/2015   Willa Rough DPT, ATC Willa Rough 02/17/2020, 1:21 PM  [place] MAIN [facility] SERVICES [address] [place], [place], [address] Phone: [phone]   Fax:  [phone]  Name: Shaindel  Zerina Hallinan MRN: [id number] Date of Birth: 11-03-1980

## 2020-02-19 ENCOUNTER — Ambulatory Visit: Payer: BC Managed Care – PPO

## 2020-02-24 ENCOUNTER — Other Ambulatory Visit: Payer: Self-pay

## 2020-02-24 ENCOUNTER — Ambulatory Visit: Payer: BC Managed Care – PPO

## 2020-02-24 DIAGNOSIS — M6281 Muscle weakness (generalized): Secondary | ICD-10-CM

## 2020-02-24 DIAGNOSIS — M62838 Other muscle spasm: Secondary | ICD-10-CM | POA: Diagnosis not present

## 2020-02-24 DIAGNOSIS — R262 Difficulty in walking, not elsewhere classified: Secondary | ICD-10-CM

## 2020-02-24 NOTE — Therapy (Signed)
[name] SERVICES [address] [place], [place], [address] Phone: [phone]   Fax:  [phone]  Physical Therapy Progress Note   Dates of reporting period  10/22/19   to   02/24/20   The patient has been informed of current processes in place at Outpatient Rehab to protect patients from Covid-19 exposure including social distancing, schedule modifications, and new cleaning procedures. After discussing their particular risk with a therapist based on the patient's personal risk factors, the patient has decided to proceed with in-person therapy.  Patient Details  Name: Christine Chavez MRN: [id number] Date of Birth: [birth year] Referring Provider (PT): [name]   Encounter Date: 02/24/2020   PT End of Session - 02/24/20 1141    Visit Number 20    Number of Visits 93    Date for PT Re-Evaluation 12/30/19    Authorization Type [employer]    Authorization Time Period from 10/22/2019 through 12/30/2019    Authorization - Visit Number 7    Authorization - Number of Visits 20    Progress Note Due on Visit 29    PT Start Time 1030    PT Stop Time 1130    PT Time Calculation (min) 60 min    Activity Tolerance Patient tolerated treatment well;No increased pain    Behavior During Therapy WFL for tasks assessed/performed           Past Medical History:  Diagnosis Date  . Complication of anesthesia    ? seizures after anesthesia   . Headache   . Migraines   . Neurogenic bladder   . Renal disorder   . Vision abnormalities     Past Surgical History:  Procedure Laterality Date  . ANTERIOR CRUCIATE LIGAMENT REPAIR  1997  . APPENDECTOMY    . COLONOSCOPY WITH PROPOFOL N/A 11/11/2018   Procedure: COLONOSCOPY WITH PROPOFOL;  Surgeon: [name], MD;  Location: [facility] ENDOSCOPY;  Service: Endoscopy;  Laterality: N/A;  . CYSTOSCOPY WITH STENT PLACEMENT Right 04/17/2016   Procedure: CYSTOSCOPY WITH STENT PLACEMENT;   Surgeon: [name], MD;  Location: [facility] ORS;  Service: Urology;  Laterality: Right;  . ESOPHAGOGASTRODUODENOSCOPY (EGD) WITH PROPOFOL N/A 11/11/2018   Procedure: ESOPHAGOGASTRODUODENOSCOPY (EGD) WITH PROPOFOL;  Surgeon: [name], MD;  Location: [facility] ENDOSCOPY;  Service: Endoscopy;  Laterality: N/A;  . EXPLORATORY LAPAROTOMY  1999  . KIDNEY STONE SURGERY  04/2016  . REVISION UROSTOMY CUTANEOUS    . REVISION UROSTOMY CUTANEOUS  01/10/2018  . SUPRAPUBIC CATHETER PLACEMENT  08/2017  . TONSILLECTOMY      There were no vitals filed for this visit.  Pelvic Floor Physical Therapy Treatment Note  SCREENING  Changes in medications, allergies, or medical history?: Is on a new antibiotic that is in a powder form (Monurol) and a new maintenance medication (Hiprex) that is not an antibiotic. Also given a prescription for vaginal valium.   SUBJECTIVE  Patient reports: She took the last dose of the antibiotic yesterday and it makes her feel cruddy but she started the new maintenance drug today. She can tell that the bladder is not smelling so foul already although there is still some but she has been having some bladder spasms the last couple days. Has tried using the vaginal valium a couple times. She felt a little off and nauseated for a little bit but did get (70%) relief from the pain. Has been having increased frequency of migraines and headaches, they feel different and  more in the base of the skull, this is why she had to miss last appointment, R base of Neck pain 5/10.     Precautions:  Neurological diagnosis pending, has "non-specific" brain lesions   Pain update: (numbers not updated due to Pt. Difficulty describing sensation) Location of pain: B Flank pain (bladder Current pain:  4/10(5/10) Max pain: 6/10 (8/10 when it spasms) Least pain:  3/10 (4/10) Nature of pain: sharp to dull ache (grabbing)  **following treatment pt reports feeling "looser" and the normal  post-needling ache in the neck.  Patient Goals: Get rid of the discomfort with catheterizing and ache/bladder pain. Be able to take fewer medications and be able to enjoy spending time with her family again, doing "all the mom things"    OBJECTIVE  Changes in:  Observations/Posture:  L lumbar curve prominent with transverse processes more prominent on the L. (from previous note)  C3 spinous process deviated R from tension through R cervical extensors.   Range of Motion/Flexibilty:  (from 12/10) Spine: ~ 2-3 fingers from knee with SB B. ~ 50% reduced R rotation with stretch in L, ~ 75% reduced L rotation with increased tightness. HS: able to achieve 90 deg. Hip flexion in seated HS stretch. (from prior treatment)   Strength/MMT:  LE MMT (from 12-10) LE MMT Left Right  Hip flex:  (L2) /5 /5  Hip ext: 3+/5 3+/5  Hip abd: 4/5 4/5  Hip add: 4/5 3+/5  Hip IR 4/5 3+/5  Hip ER 4/5 4/5   (From 3/24): Pt. Demonstrates 3/5 strength with "I's, Y's, and T's" but 4/5 with W's. (from 6/22) DF: 9 reps with B. Eversion/Inversion: 4/5 on R, 4+/5 on L. DF: L 4+/5, R 3+/5  Seated IR/ER on L: 4/5, R 3+/5 (from 8-5) L DF and Eversion 4+/5 with first 1-3 reps and fatigue decreased to 4/4. (9/9) Pt. Demonstrates ~ 4+/5 B with increased spasticity B.   Balance/stability:  (6-29) Pt. Able to achieve good posture without cueing but only able to maintain for ~ 2.5 min. Before seeking stability from the table and forward rolled shoulders. With cue to engage the deep core, Pt. Recognizes increased stability and comfort.  8/5: Pt. Demonstrating decreased stability in gait and standing balance today with fasciculations increasing in direct relation to muscular endurance required. Able to stand ~ 10 sec. In SLS with MIN UE support before intensity of spasms make attempt at balance futile.    Pelvic Floor External Exam: (From prior session)- [Introitus Appears: elevated Skin integrity: WNL Palpation:  TTP to B STP Cough: limited motion Prolapse visible?: no Scar mobility: not assessed  Internal Vaginal Exam: Strength (PERF): Pt. Has difficulty sensing the PFM to generate contraction or relaxation due to high tone/neuro involvement Symmetry: L>R for spasm/TTP Palpation: TTP to all muscles throughout B Prolapse: none]  unable to coordinate squeeze and TTP to all areas with the greatest at B OI and coccygeus. ** following TP release Pt. Able to achieve 1/5 PFM contraction. Decreased TTP at B anterior PR/PC following treatment. (10/14).  TODAY: not re-assessed today.  Abdominal:  Pt. Demonstrated ability to recruit R>L obliques and TA with pelvic tilts. (from prior session)  Palpation: Decreased fascial and scar mobility through lower abdomen with radiating Sx. To the pelvis and LB. (from prior session)  TODAY: TTP to sub-occipitals and both superficial and deep cervical extensors on the R>L.  Gait Analysis:  INTERVENTIONS THIS SESSION:  Manual: Performed TP release and STM to R multifidus, sub-occipitals,  and cervical extensors at ~ C3 followed by R to L lateral mobs to C3 to decrease spasm and pain and allow for improved balance of musculature for improved function and decreased symptoms and to improve mobility of joint and surrounding connective tissue and decrease pressure on nerve roots for improved conductivity and function of down-stream tissues.   Dry-needle: Performed TPDN with a .25x21mm needle and standard approach as described below to decrease spasm and pain and allow for improved balance of musculature for improved function and decreased symptoms.  Therex: breifly reviewed chin-tucks to maintain decreased spasms and cervicogenic HA.  Self-care: discussed Hiprex and interaction of some OTC medications, discovered that she should not use the "Target" part of [place] but the other parts do not alkalize the urine and are OK. Reviewed goals and made new POC.  Total time: 60  min.                                PT Short Term Goals - 02/24/20 1142      PT SHORT TERM GOAL #1   Title Patient will demonstrate coordinated diaphragmatic breathing with pelvic tilts to demonstrate improved control of diaphragm and TA, to allow for further strengthening of core musculature and decreased pelvic floor spasm.    Baseline Pt. demonstrates breathing dysfunction and poor PFM coordination evidenced by anal manometry As of 2/22: Pt. continues to have restriction in her diaphragm and near T/L junction but is able to intentionally use diaphragmatic breathing through the available ROM.    Time 5    Period Weeks    Status Achieved    Target Date 04/20/19      PT SHORT TERM GOAL #2   Title Patient will report a reduction in pain to no greater than 6/10 over the prior week to demonstrate symptom improvement.    Baseline Pain is 10/10 at worst, 2/10 at best As of 2/22: Pt. had achieved this goal for 1 week as of 1/21 but may have infection or other insult that caused pain to increase again. As of 4/29: Pain high of 8/10 but has been over-doing her activity over the past week. As of 6/10: Pt. was able to keep pain at 6 or below since prior visit by using TENS to help keep tension lower but has not had a full week to test if it will keep working. As of 7/6: Pt. had met this goal but has a new UTI and it caused pain to spike to 8/10 over the weekend. As of 11/9: Pt. has just finally made progress with long-standing UTI and new POC for treatment so she is still having pain spikes up to 8/10 but we expect greater improvement over the next few months.    Time 5    Period Weeks    Status On-going    Target Date 03/30/20      PT SHORT TERM GOAL #3   Title Patient will demonstrate HEP x1 in the clinic to demonstrate understanding and proper form to allow for further improvement.    Baseline Pt. lacks knowledge of therepeutic exercises that can decrease her pain/Sx.     Time 5    Period Weeks    Status Achieved    Target Date 04/20/19      PT SHORT TERM GOAL #4   Title Patient will report consistent use of foot-stool (squatty-potty) for positioning with BM to decrease pain with BM  and intra-abdominal pressure.    Baseline Pt. having constipation due to PFM dysfunction    Time 5    Period Weeks    Status Achieved    Target Date 04/20/19             PT Long Term Goals - 02/24/20 0001      PT LONG TERM GOAL #1   Title Pt. will be able to participate in regular ADL's with least restrictive device without pain increasing greater than 2/10    Baseline Pt. limited in her ability to perform household duties by increased pain and fatigue. As of 4/29: Her legs have more endurance with walking before she gets a tremor. Still needs to sit down the majority of the time she is cooking. Still needs back support for prolonged sitting. As of 6/10: Pt. able to do for  ~1 hour before she needs to rest to prevent increased pain, pain is averaging ~ 4-5/10    Time 10    Period Weeks    Status On-going    Target Date 05/04/20      PT LONG TERM GOAL #2   Title Patient will score at or below 65/300  on the PFDI and 35% on the Female NIH-CPSI to demonstrate a clinically meaningful decrease in disability and distress due to pelvic floor dysfunction.    Baseline PFDI: 110/300, Female NIH-CPSI: 29/43 (67%) As of 4/29: 100/300 on PFDI and 30/43 on NIH-CPSI As of 7/6:  NIH-CPSI: 32/43 POPDI: 61/100, CRADI-8: 29/100, UDI: 44/100 (PFDI: 149/300)    Time 10    Period Weeks    Status On-going    Target Date 05/04/20      PT LONG TERM GOAL #3   Title Patient will report no pain with intercourse to demonstrate improved functional ability.    Baseline Pt. Having significant pain with intercourse. As of 4/29: is a little better (20-30%) but is worse on days where overall tension/pain is worse. As of 6/10: can have days where there is not much pain but other days where there is  significant pain. As of 11/9: same as previous update.    Time 10    Period Weeks    Status On-going    Target Date 05/04/20      PT LONG TERM GOAL #4   Title Pt will report ability to work in her yard for greater than 30 minutes without extreme fatigue    Baseline limited to 10-15 minutes before pt requires ending activity. As of 4/29: ~ 20 min. As of 6/10: has not been gardening much but can do ~ 30 min of similar indoor activity level before increased fatigue.    Time 10    Period Weeks    Status Achieved    Target Date 10/23/19      PT LONG TERM GOAL #5   Title Pt. will be able to go 2-3 hours between emptying her bladder without increased pain and empty her bladder fully whether by urostomy, cath, or voluntary release.    Baseline Pt. has a urostomy but continues to have to self-catheterize. Has pain with bladder filling and when using catheter. As of 4/29: is doing better since starting to take the pregabalin andd baclofen but occasionally still has pain with bladder filling. It is bad when she has an infection or over-does activity, better on "normal" days which have been few and far between due to frequent UTI's. As of 6/10: Pt. has "good" and "bad" days but  has more days where she can go 2-3 hours without increased pain    Time 10    Period Weeks    Status On-going    Target Date 05/04/20      PT LONG TERM GOAL #6   Title Patient will report having BM's at least every-other day with consistency between [facility] stool scale 3-5 over the prior week to demonstrate decreased constipation.    Baseline Pt. unable to have regula BM's without medication, manometry shows PFM dysfunction. As of 4/29: still having to use and enema or supository occasionally and still needing linzess. As of 6/10: Pt. has days where she can empty ok and days where she needs to use an Enema to stimulate a BM, feels that it is just the insertion of the enema, not the solution that does the job. As of 11/9: Pt. not  having constipation due to colonic infection that has shifted her toward diarrhea. Having BM's daily, sometimes type 5.    Time 10    Period Weeks    Status On-going    Target Date 05/04/20                 Plan - 02/24/20 1150    Clinical Impression Statement Pt. has again had a hard time making progress toward her goals due to two major set-backs from 1) contracting covid-19 and 2) persistent UTI causing increased neurological tone and inhibiting progress in her ability to maintain spasm and pain relief. With this being said, she has noted improvements in her pelvic floor spasms and pain following the hypogastric nerve block and PT has been working closely with the Pt. to help advocate for further evaluation of her Sx. and get to a urogynecologist. She has recently started to notice an improvement in her urine color and odor so we are hopeful that the treatment moving forward will have a more lasting effect than it has lately, where she has been able to get temporary relief/decreased pain for 1-2 days but without long-term improvement. As the Pt. is able to function at a higher level when she is not actively fighting an infection, we also expect to see greater improvements in strength and function once pt. is able to tolerate more activity. it is believed that had this Pt. not been undergoing PT through the last few months, her functional status would be worse due to the severe impact that Covid-19 and her long-standing UTI have had on both her mental and physical health. The Pt. is motivated to move forward and work on strengthening and increasing activity through a gradual, graded approach to allow for improved function now that she feels she has a better plan for managing her bladder issues. She will continue to benefit from skilled pelvic PT to continue to work toward her remaining goals and focus on building back her core stability and endurance to allow for greater function and independence.  Pt. responded well to all treatment today, demonstrating decreased spasm and TTP through the cervical spine and appropriate performance of chin-tucks to help maintain improved cervical stability and alignment.    Personal Factors and Comorbidities Comorbidity 3+    Comorbidities neurologic insult with pending diagnosis, multiple recurrent UTI's, BLE weakness, depression    Examination-Activity Limitations Caring for Others;Carry;Continence;Squat;Stairs;Toileting;Locomotion Level;Lift;Stand    Examination-Participation Restrictions Church;Cleaning;Community Activity;Driving;Interpersonal Relationship;Laundry;Yard Work;Meal Prep    Stability/Clinical Decision Making Unstable/Unpredictable    Clinical Decision Making High    Rehab Potential Fair    PT Frequency 2x /  week    PT Duration Other (comment)    PT Treatment/Interventions ADLs/Self Care Home Management;Aquatic Therapy;Biofeedback;Moist Heat;Electrical Stimulation;Cryotherapy;Traction;Ultrasound;Therapeutic activities;Functional mobility training;Stair training;Gait training;Therapeutic exercise;Balance training;Neuromuscular re-education;Patient/family education;Manual techniques;Dry needling;Scar mobilization;Energy conservation;Taping;Joint Manipulations;Spinal Manipulations;Visual/perceptual remediation/compensation;Passive range of motion    PT Next Visit Plan deep-core, scapular tracking/strength, and balance, MFR to lower abdomen, Review/ give new pain diary, cupping to medial thighs and upper back, re-assess neural glides on LLE, focus on graded activity, and seated and standing balance/core strength. review I's Y's and T's in prone, thoracic mobility? perform further release and PNF for R>L shoulder blades, more scar-release and cupping at suprapubic scar, MFR around B ilium. continue to work on thoracolumbar rotation ROM and decreasing spasms, add SLS balance training, .    PT Home Exercise Plan A day: low back stretch, side-stretch,  hamstring stretch, butterfly stretch, hip EXT in prone with knee bent, side-lying hip ABD, calf raises. B day: Chest-stretch, Thoracic extensions over a towel roll, seated rows and seated scapular retraction (pull shoulder blades together like "T's") with band, child's pose stretch, self internal TP release (especially when Pelvic floor spasms high), Every day: Pain diary, Posterior pelvic tilts with deep core activation and red band for obliques activation, bow-and-arrow, TENS unit 3+ times per day for 30 min and as needed if pain starts to increase. TENS at ASIS/PSIS at strong tingle for 1 hr. per day. Sleeper stretch PRN    Consulted and Agree with Plan of Care Patient           Patient will benefit from skilled therapeutic intervention in order to improve the following deficits and impairments:  Abnormal gait, Decreased balance, Decreased endurance, Decreased mobility, Difficulty walking, Increased muscle spasms, Impaired sensation, Improper body mechanics, Impaired tone, Decreased activity tolerance, Decreased coordination, Decreased strength, Postural dysfunction, Pain  Visit Diagnosis: Other muscle spasm  Muscle weakness (generalized)  Difficulty in walking, not elsewhere classified     Problem List Patient Active Problem List   Diagnosis Date Noted  . Seizure ([place]) 11/11/2018  . Major depressive disorder, recurrent episode, moderate ([place]) 02/07/2018  . Nephrolithiasis 04/16/2016  . Numbness 07/28/2015  . Bladder retention 06/23/2015  . Abdominal pain 06/04/2015  . Dizziness 05/18/2015  . Neck pain 05/18/2015  . Complicated migraine 09/81/1914  . Other fatigue 04/28/2015  . Abnormal finding on MRI of brain 04/28/2015  . D (diarrhea) 03/29/2015  . H/O disease 03/29/2015  . Abnormal weight loss 03/29/2015  . Muscle weakness (generalized) 03/14/2015  . Headache, migraine 03/10/2015   Willa Rough DPT, ATC Willa Rough 02/24/2020, 12:11 PM  [place] [facility] SERVICES [address] [place], [place], [address] Phone: [phone]   Fax:  [phone]  Name: Bathsheba Durrett MRN: [id number] Date of Birth: 09-14-1980

## 2020-02-26 ENCOUNTER — Ambulatory Visit: Payer: BC Managed Care – PPO

## 2020-02-26 ENCOUNTER — Other Ambulatory Visit: Payer: Self-pay

## 2020-02-26 DIAGNOSIS — M6281 Muscle weakness (generalized): Secondary | ICD-10-CM

## 2020-02-26 DIAGNOSIS — R262 Difficulty in walking, not elsewhere classified: Secondary | ICD-10-CM

## 2020-02-26 DIAGNOSIS — M62838 Other muscle spasm: Secondary | ICD-10-CM

## 2020-02-26 NOTE — Therapy (Signed)
[place] MAIN [facility] SERVICES [address] [place], [place], [address] Phone: [phone]   Fax:  [phone]  Physical Therapy Treatment  The patient has been informed of current processes in place at Outpatient Rehab to protect patients from Covid-19 exposure including social distancing, schedule modifications, and new cleaning procedures. After discussing their particular risk with a therapist based on the patient's personal risk factors, the patient has decided to proceed with in-person therapy.  Patient Details  Name: Christine Chavez MRN: [id number] Date of Birth: 1980-10-15 Referring Provider (PT): [name]   Encounter Date: 02/26/2020   PT End of Session - 02/26/20 1238    Visit Number 46    Number of Visits 36    Date for PT Re-Evaluation 05/04/20    Authorization Type [employer]    Authorization Time Period from 02/24/20 through 05/04/20    Authorization - Visit Number 21    Authorization - Number of Visits 30    Progress Note Due on Visit 72    PT Start Time 0930    PT Stop Time 1030    PT Time Calculation (min) 60 min    Activity Tolerance Patient tolerated treatment well;No increased pain    Behavior During Therapy WFL for tasks assessed/performed           Past Medical History:  Diagnosis Date  . Complication of anesthesia    ? seizures after anesthesia   . Headache   . Migraines   . Neurogenic bladder   . Renal disorder   . Vision abnormalities     Past Surgical History:  Procedure Laterality Date  . ANTERIOR CRUCIATE LIGAMENT REPAIR  1997  . APPENDECTOMY    . COLONOSCOPY WITH PROPOFOL N/A 11/11/2018   Procedure: COLONOSCOPY WITH PROPOFOL;  Surgeon: [name], MD;  Location: [facility] ENDOSCOPY;  Service: Endoscopy;  Laterality: N/A;  . CYSTOSCOPY WITH STENT PLACEMENT Right 04/17/2016   Procedure: CYSTOSCOPY WITH STENT PLACEMENT;  Surgeon: [name], MD;  Location: [facility] ORS;  Service:  Urology;  Laterality: Right;  . ESOPHAGOGASTRODUODENOSCOPY (EGD) WITH PROPOFOL N/A 11/11/2018   Procedure: ESOPHAGOGASTRODUODENOSCOPY (EGD) WITH PROPOFOL;  Surgeon: [name], MD;  Location: [facility] ENDOSCOPY;  Service: Endoscopy;  Laterality: N/A;  . EXPLORATORY LAPAROTOMY  1999  . KIDNEY STONE SURGERY  04/2016  . REVISION UROSTOMY CUTANEOUS    . REVISION UROSTOMY CUTANEOUS  01/10/2018  . SUPRAPUBIC CATHETER PLACEMENT  08/2017  . TONSILLECTOMY      There were no vitals filed for this visit.  Pelvic Floor Physical Therapy Treatment Note  SCREENING  Changes in medications, allergies, or medical history?: Is on a new antibiotic that is in a powder form (Monurol) and a new maintenance medication (Hiprex) that is not an antibiotic. Also given a prescription for vaginal valium.   SUBJECTIVE  Patient reports: She can tell that part of her problem right now is that she is very stressed out. Two kids made basketball teams and she is trying to figure out how to coordinate transportation for the kids to and  From practices and games. Her father has some bad labs that could indicate cancer as well which is stressing her out and she wants to decrease the stress on her mom. She feels like working on the baskets helps her keep her mind occupied and has tried working on some. Her husband is also having a really hard time coping with everything and is depressed. She has a lot of  stress and tension up top. Back was bothering her a little more yesterday but bladder is a little better. She was bloated/distended yesterday and felt like her food was stuck and wouldn't digest well. Feels like the Hiprex may be affecting her digestion. Has been taking [place] in the morning and at night some days.  Precautions:  Neurological diagnosis pending, has "non-specific" brain lesions   Pain update: (numbers not updated due to Pt. Difficulty describing sensation) Location of pain: B Flank pain  (bladder Current pain:  4/10(4/10) Max pain: 7/10 (7/10 when it spasms) Least pain:  3/10 (4/10) Nature of pain: sharp to dull ache (grabbing)  **following treatment pt reports feeling "looser" and the normal post-needling ache in the neck.  Patient Goals: Get rid of the discomfort with catheterizing and ache/bladder pain. Be able to take fewer medications and be able to enjoy spending time with her family again, doing "all the mom things"    OBJECTIVE  Changes in:  Observations/Posture:  L lumbar curve prominent with transverse processes more prominent on the L. (from previous note)  C3 spinous process deviated R from tension through R cervical extensors.   Range of Motion/Flexibilty:  (from 12/10) Spine: ~ 2-3 fingers from knee with SB B. ~ 50% reduced R rotation with stretch in L, ~ 75% reduced L rotation with increased tightness. HS: able to achieve 90 deg. Hip flexion in seated HS stretch. (from prior treatment)   Strength/MMT:  LE MMT (from 12-10) LE MMT Left Right  Hip flex:  (L2) /5 /5  Hip ext: 3+/5 3+/5  Hip abd: 4/5 4/5  Hip add: 4/5 3+/5  Hip IR 4/5 3+/5  Hip ER 4/5 4/5   (From 3/24): Pt. Demonstrates 3/5 strength with "I's, Y's, and T's" but 4/5 with W's. (from 6/22) DF: 9 reps with B. Eversion/Inversion: 4/5 on R, 4+/5 on L. DF: L 4+/5, R 3+/5  Seated IR/ER on L: 4/5, R 3+/5 (from 8-5) L DF and Eversion 4+/5 with first 1-3 reps and fatigue decreased to 4/4. (9/9) Pt. Demonstrates ~ 4+/5 B with increased spasticity B.   Balance/stability:  (6-29) Pt. Able to achieve good posture without cueing but only able to maintain for ~ 2.5 min. Before seeking stability from the table and forward rolled shoulders. With cue to engage the deep core, Pt. Recognizes increased stability and comfort.  8/5: Pt. Demonstrating decreased stability in gait and standing balance today with fasciculations increasing in direct relation to muscular endurance required. Able to stand ~  10 sec. In SLS with MIN UE support before intensity of spasms make attempt at balance futile.    Pelvic Floor External Exam: (From prior session)- [Introitus Appears: elevated Skin integrity: WNL Palpation: TTP to B STP Cough: limited motion Prolapse visible?: no Scar mobility: not assessed  Internal Vaginal Exam: Strength (PERF): Pt. Has difficulty sensing the PFM to generate contraction or relaxation due to high tone/neuro involvement Symmetry: L>R for spasm/TTP Palpation: TTP to all muscles throughout B Prolapse: none]  unable to coordinate squeeze and TTP to all areas with the greatest at B OI and coccygeus. ** following TP release Pt. Able to achieve 1/5 PFM contraction. Decreased TTP at B anterior PR/PC following treatment. (10/14).  TODAY: not re-assessed today.  Abdominal:  Pt. Demonstrated ability to recruit R>L obliques and TA with pelvic tilts. (from prior session)  Palpation: Decreased fascial and scar mobility through lower abdomen with radiating Sx. To the pelvis and LB. (from prior session)  TODAY: TTP  to sub-occipitals and both superficial and deep cervical extensors on the R>L.  Gait Analysis:  INTERVENTIONS THIS SESSION:  Theract: Educated on and practiced progressive relaxation technique to improve Pt's awareness of stored tension and active relaxation of musculature to decrease pain/spasm cycle.  Self-care: Educated on the importance of her doing her pain diary, especially now, in order to allow for continued improvement in her pain and functional ability. Educated on using the speed and pitch of her voice as indicators of rising anxiety and breathing and slowing speech/lowering tone as a way to regain control and help break the cycle. Gave pt. Direction to take at least 30 min. After outings to doctors or other social engagements to lie down, do her progressive relaxation and meditation and rest before trying to be "productive" to allow her body adequate rest  to be able to perform without exacerbating Sx. By pushing past her tolerance threshold.   Total time: 60 min.                               PT Short Term Goals - 02/24/20 1142      PT SHORT TERM GOAL #1   Title Patient will demonstrate coordinated diaphragmatic breathing with pelvic tilts to demonstrate improved control of diaphragm and TA, to allow for further strengthening of core musculature and decreased pelvic floor spasm.    Baseline Pt. demonstrates breathing dysfunction and poor PFM coordination evidenced by anal manometry As of 2/22: Pt. continues to have restriction in her diaphragm and near T/L junction but is able to intentionally use diaphragmatic breathing through the available ROM.    Time 5    Period Weeks    Status Achieved    Target Date 04/20/19      PT SHORT TERM GOAL #2   Title Patient will report a reduction in pain to no greater than 6/10 over the prior week to demonstrate symptom improvement.    Baseline Pain is 10/10 at worst, 2/10 at best As of 2/22: Pt. had achieved this goal for 1 week as of 1/21 but may have infection or other insult that caused pain to increase again. As of 4/29: Pain high of 8/10 but has been over-doing her activity over the past week. As of 6/10: Pt. was able to keep pain at 6 or below since prior visit by using TENS to help keep tension lower but has not had a full week to test if it will keep working. As of 7/6: Pt. had met this goal but has a new UTI and it caused pain to spike to 8/10 over the weekend. As of 11/9: Pt. has just finally made progress with long-standing UTI and new POC for treatment so she is still having pain spikes up to 8/10 but we expect greater improvement over the next few months.    Time 5    Period Weeks    Status On-going    Target Date 03/30/20      PT SHORT TERM GOAL #3   Title Patient will demonstrate HEP x1 in the clinic to demonstrate understanding and proper form to allow for further  improvement.    Baseline Pt. lacks knowledge of therepeutic exercises that can decrease her pain/Sx.    Time 5    Period Weeks    Status Achieved    Target Date 04/20/19      PT SHORT TERM GOAL #4   Title Patient will  report consistent use of foot-stool (squatty-potty) for positioning with BM to decrease pain with BM and intra-abdominal pressure.    Baseline Pt. having constipation due to PFM dysfunction    Time 5    Period Weeks    Status Achieved    Target Date 04/20/19             PT Long Term Goals - 02/24/20 0001      PT LONG TERM GOAL #1   Title Pt. will be able to participate in regular ADL's with least restrictive device without pain increasing greater than 2/10    Baseline Pt. limited in her ability to perform household duties by increased pain and fatigue. As of 4/29: Her legs have more endurance with walking before she gets a tremor. Still needs to sit down the majority of the time she is cooking. Still needs back support for prolonged sitting. As of 6/10: Pt. able to do for  ~1 hour before she needs to rest to prevent increased pain, pain is averaging ~ 4-5/10    Time 10    Period Weeks    Status On-going    Target Date 05/04/20      PT LONG TERM GOAL #2   Title Patient will score at or below 65/300  on the PFDI and 35% on the Female NIH-CPSI to demonstrate a clinically meaningful decrease in disability and distress due to pelvic floor dysfunction.    Baseline PFDI: 110/300, Female NIH-CPSI: 29/43 (67%) As of 4/29: 100/300 on PFDI and 30/43 on NIH-CPSI As of 7/6:  NIH-CPSI: 32/43 POPDI: 61/100, CRADI-8: 29/100, UDI: 44/100 (PFDI: 149/300)    Time 10    Period Weeks    Status On-going    Target Date 05/04/20      PT LONG TERM GOAL #3   Title Patient will report no pain with intercourse to demonstrate improved functional ability.    Baseline Pt. Having significant pain with intercourse. As of 4/29: is a little better (20-30%) but is worse on days where overall  tension/pain is worse. As of 6/10: can have days where there is not much pain but other days where there is significant pain. As of 11/9: same as previous update.    Time 10    Period Weeks    Status On-going    Target Date 05/04/20      PT LONG TERM GOAL #4   Title Pt will report ability to work in her yard for greater than 30 minutes without extreme fatigue    Baseline limited to 10-15 minutes before pt requires ending activity. As of 4/29: ~ 20 min. As of 6/10: has not been gardening much but can do ~ 30 min of similar indoor activity level before increased fatigue.    Time 10    Period Weeks    Status Achieved    Target Date 10/23/19      PT LONG TERM GOAL #5   Title Pt. will be able to go 2-3 hours between emptying her bladder without increased pain and empty her bladder fully whether by urostomy, cath, or voluntary release.    Baseline Pt. has a urostomy but continues to have to self-catheterize. Has pain with bladder filling and when using catheter. As of 4/29: is doing better since starting to take the pregabalin andd baclofen but occasionally still has pain with bladder filling. It is bad when she has an infection or over-does activity, better on "normal" days which have been few and far  between due to frequent UTI's. As of 6/10: Pt. has "good" and "bad" days but has more days where she can go 2-3 hours without increased pain    Time 10    Period Weeks    Status On-going    Target Date 05/04/20      PT LONG TERM GOAL #6   Title Patient will report having BM's at least every-other day with consistency between [facility] stool scale 3-5 over the prior week to demonstrate decreased constipation.    Baseline Pt. unable to have regula BM's without medication, manometry shows PFM dysfunction. As of 4/29: still having to use and enema or supository occasionally and still needing linzess. As of 6/10: Pt. has days where she can empty ok and days where she needs to use an Enema to stimulate a BM,  feels that it is just the insertion of the enema, not the solution that does the job. As of 11/9: Pt. not having constipation due to colonic infection that has shifted her toward diarrhea. Having BM's daily, sometimes type 5.    Time 10    Period Weeks    Status On-going    Target Date 05/04/20                 Plan - 02/26/20 1239    Clinical Impression Statement Pt. Responded well to all interventions today, demonstrating improved understanding of the impact that "over-doing" it does on her physical and mental health and what tools she has to begin better managing her stress to allow for achievement of her goals. They will continue to benefit from skilled physical therapy to work toward remaining goals and maximize function as well as decrease likelihood of symptom increase or recurrence.    PT Next Visit Plan deep-core, scapular tracking/strength, and balance, MFR to lower abdomen, Review/ give new pain diary, cupping to medial thighs and upper back, re-assess neural glides on LLE, focus on graded activity, and seated and standing balance/core strength. review I's Y's and T's in prone, thoracic mobility? perform further release and PNF for R>L shoulder blades, more scar-release and cupping at suprapubic scar, MFR around B ilium. continue to work on thoracolumbar rotation ROM and decreasing spasms, add SLS balance training, .    PT Home Exercise Plan A day: low back stretch, side-stretch, hamstring stretch, butterfly stretch, hip EXT in prone with knee bent, side-lying hip ABD, calf raises. B day: Chest-stretch, Thoracic extensions over a towel roll, seated rows and seated scapular retraction (pull shoulder blades together like "T's") with band, child's pose stretch, self internal TP release (especially when Pelvic floor spasms high), Every day: Pain diary, Posterior pelvic tilts with deep core activation and red band for obliques activation, bow-and-arrow, TENS unit 3+ times per day for 30 min  and as needed if pain starts to increase. TENS at ASIS/PSIS at strong tingle for 1 hr. per day. Sleeper stretch PRN    Consulted and Agree with Plan of Care Patient           Patient will benefit from skilled therapeutic intervention in order to improve the following deficits and impairments:     Visit Diagnosis: Other muscle spasm  Muscle weakness (generalized)  Difficulty in walking, not elsewhere classified     Problem List Patient Active Problem List   Diagnosis Date Noted  . Seizure ([place]) 11/11/2018  . Major depressive disorder, recurrent episode, moderate ([place]) 02/07/2018  . Nephrolithiasis 04/16/2016  . Numbness 07/28/2015  . Bladder retention 06/23/2015  .  Abdominal pain 06/04/2015  . Dizziness 05/18/2015  . Neck pain 05/18/2015  . Complicated migraine 51/76/1607  . Other fatigue 04/28/2015  . Abnormal finding on MRI of brain 04/28/2015  . D (diarrhea) 03/29/2015  . H/O disease 03/29/2015  . Abnormal weight loss 03/29/2015  . Muscle weakness (generalized) 03/14/2015  . Headache, migraine 03/10/2015   Willa Rough DPT, ATC Willa Rough 02/26/2020, 12:49 PM  [place] MAIN [facility] SERVICES [address] [place], [place], [address] Phone: [phone]   Fax:  [phone]  Name: Athalia Setterlund MRN: [id number] Date of Birth: November 28, 1980

## 2020-02-26 NOTE — Patient Instructions (Signed)
   Write down the things you ACCOMPLISHED every day. Write down and acknowledgements of what was not great (stress, pain, not able to do something you want to do, etc). Write down what ACTIONS you took to combat against pain or stress.  Write your average pain in pelvic and back/other areas for the day and if you had a pain spike.  You HAVE to take 30 min. After you come home from an appointment or activity outside of the home to do the progressive relaxtion technique and then meditate for 5 min. And then spend the rest of the time RESTING and not allowing yourself to feel bad about what you are not doing. Remember how much MORE EFFICIENT you will be at everything else when you are rested and in a healthy mental state.  Listen to your cadence and tone a speech and whether you hear yourself inhale and feel shoulder rise as an indicator that you need to slow down, breathe and lower your tone to help improve your stress response.  Progressive relaxation technique:  Start by tensing all the muscles in your right foot, then calf, then upper leg, glute, low back and hold for 2 deep breaths, release the tension and let the limb melt into the table. Next start with the left leg and do the same progression, foot, lower leg, upper leg, glute, low back, then release. Then the Right hand make a fist, tense the forearm, the upper arm, shoulder, hold for two breaths, and release letting it melt into the ground, repeat on the left. Finally, start with both feet, both calves, both upper legs, both glutes, lower abdomen, upper abdomen, make fists, tighten forearms, upper arms, shoulders, face. Hold for two breaths and then completely melt into the floor.

## 2020-03-02 ENCOUNTER — Ambulatory Visit: Payer: BC Managed Care – PPO

## 2020-03-02 ENCOUNTER — Other Ambulatory Visit: Payer: Self-pay

## 2020-03-02 DIAGNOSIS — M62838 Other muscle spasm: Secondary | ICD-10-CM | POA: Diagnosis not present

## 2020-03-02 DIAGNOSIS — M6281 Muscle weakness (generalized): Secondary | ICD-10-CM

## 2020-03-02 DIAGNOSIS — R262 Difficulty in walking, not elsewhere classified: Secondary | ICD-10-CM

## 2020-03-02 NOTE — Therapy (Signed)
[place] MAIN [facility] SERVICES [address] [place], [place], [address] Phone: [phone]   Fax:  [phone]  Physical Therapy Treatment  The patient has been informed of current processes in place at Outpatient Rehab to protect patients from Covid-19 exposure including social distancing, schedule modifications, and new cleaning procedures. After discussing their particular risk with a therapist based on the patient's personal risk factors, the patient has decided to proceed with in-person therapy.  Patient Details  Name: Christine Chavez MRN: [id number] Date of Birth: Jan 29, 1981 Referring Provider (PT): [name]   Encounter Date: 03/02/2020   PT End of Session - 03/02/20 1138    Visit Number 14    Number of Visits 83    Date for PT Re-Evaluation 05/04/20    Authorization Type BCBS    Authorization Time Period from 02/24/20 through 05/04/20    Authorization - Visit Number 3    Authorization - Number of Visits 30    Progress Note Due on Visit 51    PT Start Time 1023    PT Stop Time 1130    PT Time Calculation (min) 67 min    Activity Tolerance Patient tolerated treatment well;No increased pain    Behavior During Therapy WFL for tasks assessed/performed           Past Medical History:  Diagnosis Date   Complication of anesthesia    ? seizures after anesthesia    Headache    Migraines    Neurogenic bladder    Renal disorder    Vision abnormalities     Past Surgical History:  Procedure Laterality Date   ANTERIOR CRUCIATE LIGAMENT REPAIR  1997   APPENDECTOMY     COLONOSCOPY WITH PROPOFOL N/A 11/11/2018   Procedure: COLONOSCOPY WITH PROPOFOL;  Surgeon: [name], MD;  Location: [facility] ENDOSCOPY;  Service: Endoscopy;  Laterality: N/A;   CYSTOSCOPY WITH STENT PLACEMENT Right 04/17/2016   Procedure: CYSTOSCOPY WITH STENT PLACEMENT;  Surgeon: [name], MD;  Location: [facility] ORS;  Service:  Urology;  Laterality: Right;   ESOPHAGOGASTRODUODENOSCOPY (EGD) WITH PROPOFOL N/A 11/11/2018   Procedure: ESOPHAGOGASTRODUODENOSCOPY (EGD) WITH PROPOFOL;  Surgeon: [name], MD;  Location: [facility] ENDOSCOPY;  Service: Endoscopy;  Laterality: N/A;   EXPLORATORY LAPAROTOMY  1999   KIDNEY STONE SURGERY  04/2016   REVISION UROSTOMY CUTANEOUS     REVISION UROSTOMY CUTANEOUS  01/10/2018   SUPRAPUBIC CATHETER PLACEMENT  08/2017   TONSILLECTOMY      There were no vitals filed for this visit.  Pelvic Floor Physical Therapy Treatment Note  SCREENING  Changes in medications, allergies, or medical history?: Is on a new antibiotic that is in a powder form (Monurol) and a new maintenance medication (Hiprex) that is not an antibiotic. Also given a prescription for vaginal valium.   SUBJECTIVE  Patient reports: She has been busy keeping up with schedules and has been able to meet with her friends from church and make a calandar/plan to keep up with the kids sports practices. She has pushed herself too hard trying to get a lot of things done with games coming up and her in-laws coming into town. Had a major pain spike in her shoulders before bed and had a hard time going to go to sleep. Pain averaging ~ 5/10 over the week but up to 7/10 last night. Had to use vaginal valium one day only, overall feels like it is getting better.  Precautions:  Neurological diagnosis pending,  has "non-specific" brain lesions   Pain update: (numbers not updated due to Pt. Difficulty describing sensation) Location of pain: LB and neck/shoulders (bladder) Current pain:  5/10) (3/10) Max pain: 7/10 (7/10 when it spasms) Least pain:  3/10 (4/10) Nature of pain: sharp to dull ache (grabbing)  **3/10 in neck/shoulders and LB  Patient Goals: Get rid of the discomfort with catheterizing and ache/bladder pain. Be able to take fewer medications and be able to enjoy spending time with her family again, doing "all  the mom things"    OBJECTIVE  Changes in:  Observations/Posture:  L lumbar curve prominent with transverse processes more prominent on the L. (from previous note)  C3 spinous process deviated R from tension through R cervical extensors.   Range of Motion/Flexibilty:  (from 12/10) Spine: ~ 2-3 fingers from knee with SB B. ~ 50% reduced R rotation with stretch in L, ~ 75% reduced L rotation with increased tightness. HS: able to achieve 90 deg. Hip flexion in seated HS stretch. (from prior treatment)   Strength/MMT:  LE MMT (from 12-10) LE MMT Left Right  Hip flex:  (L2) /5 /5  Hip ext: 3+/5 3+/5  Hip abd: 4/5 4/5  Hip add: 4/5 3+/5  Hip IR 4/5 3+/5  Hip ER 4/5 4/5   (From 3/24): Pt. Demonstrates 3/5 strength with "I's, Y's, and T's" but 4/5 with W's. (from 6/22) DF: 9 reps with B. Eversion/Inversion: 4/5 on R, 4+/5 on L. DF: L 4+/5, R 3+/5  Seated IR/ER on L: 4/5, R 3+/5 (from 8-5) L DF and Eversion 4+/5 with first 1-3 reps and fatigue decreased to 4/4. (9/9) Pt. Demonstrates ~ 4+/5 B with increased spasticity B.   Balance/stability:  (6-29) Pt. Able to achieve good posture without cueing but only able to maintain for ~ 2.5 min. Before seeking stability from the table and forward rolled shoulders. With cue to engage the deep core, Pt. Recognizes increased stability and comfort.  8/5: Pt. Demonstrating decreased stability in gait and standing balance today with fasciculations increasing in direct relation to muscular endurance required. Able to stand ~ 10 sec. In SLS with MIN UE support before intensity of spasms make attempt at balance futile.    Pelvic Floor External Exam: (From prior session)- [Introitus Appears: elevated Skin integrity: WNL Palpation: TTP to B STP Cough: limited motion Prolapse visible?: no Scar mobility: not assessed  Internal Vaginal Exam: Strength (PERF): Pt. Has difficulty sensing the PFM to generate contraction or relaxation due to high  tone/neuro involvement Symmetry: L>R for spasm/TTP Palpation: TTP to all muscles throughout B Prolapse: none]  unable to coordinate squeeze and TTP to all areas with the greatest at B OI and coccygeus. ** following TP release Pt. Able to achieve 1/5 PFM contraction. Decreased TTP at B anterior PR/PC following treatment. (10/14).  TODAY: not re-assessed today.  Abdominal:  Pt. Demonstrated ability to recruit R>L obliques and TA with pelvic tilts. (from prior session)  Palpation: Decreased fascial and scar mobility through lower abdomen with radiating Sx. To the pelvis and LB. (from prior session)  TODAY: TTPB pec major and upper trapezius as well as L lumbar extensors and multifidus   Gait Analysis:  INTERVENTIONS THIS SESSION:  Manual: Performed TP release to B pec major and upper trapezius as well as L lumbar extensors and multifidus followed by PA mobs through thoracic spine and UPA mobs along L transverse processes to decrease rotational component of scoliosis and allow for improved mobility and decreased pain.  Theract: reviewed pain diary and reinforced the awareness it is bringing to her sessions and how to use the information to help [place] find her current tolerance level so we can gradually increase it. Educated on and applied kinesiotape to the upper cross to encourage scapular retraction/depression and prevent return to kyphosis. Reviewed taking care of herself so she can then take better care of others. Discussed potential for adapted bicycle use as a form of exercise and fun with the kids.  Total time: 60 min.                              PT Short Term Goals - 02/24/20 1142      PT SHORT TERM GOAL #1   Title Patient will demonstrate coordinated diaphragmatic breathing with pelvic tilts to demonstrate improved control of diaphragm and TA, to allow for further strengthening of core musculature and decreased pelvic floor spasm.    Baseline Pt.  demonstrates breathing dysfunction and poor PFM coordination evidenced by anal manometry As of 2/22: Pt. continues to have restriction in her diaphragm and near T/L junction but is able to intentionally use diaphragmatic breathing through the available ROM.    Time 5    Period Weeks    Status Achieved    Target Date 04/20/19      PT SHORT TERM GOAL #2   Title Patient will report a reduction in pain to no greater than 6/10 over the prior week to demonstrate symptom improvement.    Baseline Pain is 10/10 at worst, 2/10 at best As of 2/22: Pt. had achieved this goal for 1 week as of 1/21 but may have infection or other insult that caused pain to increase again. As of 4/29: Pain high of 8/10 but has been over-doing her activity over the past week. As of 6/10: Pt. was able to keep pain at 6 or below since prior visit by using TENS to help keep tension lower but has not had a full week to test if it will keep working. As of 7/6: Pt. had met this goal but has a new UTI and it caused pain to spike to 8/10 over the weekend. As of 11/9: Pt. has just finally made progress with long-standing UTI and new POC for treatment so she is still having pain spikes up to 8/10 but we expect greater improvement over the next few months.    Time 5    Period Weeks    Status On-going    Target Date 03/30/20      PT SHORT TERM GOAL #3   Title Patient will demonstrate HEP x1 in the clinic to demonstrate understanding and proper form to allow for further improvement.    Baseline Pt. lacks knowledge of therepeutic exercises that can decrease her pain/Sx.    Time 5    Period Weeks    Status Achieved    Target Date 04/20/19      PT SHORT TERM GOAL #4   Title Patient will report consistent use of foot-stool (squatty-potty) for positioning with BM to decrease pain with BM and intra-abdominal pressure.    Baseline Pt. having constipation due to PFM dysfunction    Time 5    Period Weeks    Status Achieved    Target Date  04/20/19             PT Long Term Goals - 02/24/20 0001      PT LONG  TERM GOAL #1   Title Pt. will be able to participate in regular ADL's with least restrictive device without pain increasing greater than 2/10    Baseline Pt. limited in her ability to perform household duties by increased pain and fatigue. As of 4/29: Her legs have more endurance with walking before she gets a tremor. Still needs to sit down the majority of the time she is cooking. Still needs back support for prolonged sitting. As of 6/10: Pt. able to do for  ~1 hour before she needs to rest to prevent increased pain, pain is averaging ~ 4-5/10    Time 10    Period Weeks    Status On-going    Target Date 05/04/20      PT LONG TERM GOAL #2   Title Patient will score at or below 65/300  on the PFDI and 35% on the Female NIH-CPSI to demonstrate a clinically meaningful decrease in disability and distress due to pelvic floor dysfunction.    Baseline PFDI: 110/300, Female NIH-CPSI: 29/43 (67%) As of 4/29: 100/300 on PFDI and 30/43 on NIH-CPSI As of 7/6:  NIH-CPSI: 32/43 POPDI: 61/100, CRADI-8: 29/100, UDI: 44/100 (PFDI: 149/300)    Time 10    Period Weeks    Status On-going    Target Date 05/04/20      PT LONG TERM GOAL #3   Title Patient will report no pain with intercourse to demonstrate improved functional ability.    Baseline Pt. Having significant pain with intercourse. As of 4/29: is a little better (20-30%) but is worse on days where overall tension/pain is worse. As of 6/10: can have days where there is not much pain but other days where there is significant pain. As of 11/9: same as previous update.    Time 10    Period Weeks    Status On-going    Target Date 05/04/20      PT LONG TERM GOAL #4   Title Pt will report ability to work in her yard for greater than 30 minutes without extreme fatigue    Baseline limited to 10-15 minutes before pt requires ending activity. As of 4/29: ~ 20 min. As of 6/10: has not  been gardening much but can do ~ 30 min of similar indoor activity level before increased fatigue.    Time 10    Period Weeks    Status Achieved    Target Date 10/23/19      PT LONG TERM GOAL #5   Title Pt. will be able to go 2-3 hours between emptying her bladder without increased pain and empty her bladder fully whether by urostomy, cath, or voluntary release.    Baseline Pt. has a urostomy but continues to have to self-catheterize. Has pain with bladder filling and when using catheter. As of 4/29: is doing better since starting to take the pregabalin andd baclofen but occasionally still has pain with bladder filling. It is bad when she has an infection or over-does activity, better on "normal" days which have been few and far between due to frequent UTI's. As of 6/10: Pt. has "good" and "bad" days but has more days where she can go 2-3 hours without increased pain    Time 10    Period Weeks    Status On-going    Target Date 05/04/20      PT LONG TERM GOAL #6   Title Patient will report having BMs at least every-other day with consistency between [facility] stool scale  3-5 over the prior week to demonstrate decreased constipation.    Baseline Pt. unable to have regula BM's without medication, manometry shows PFM dysfunction. As of 4/29: still having to use and enema or supository occasionally and still needing linzess. As of 6/10: Pt. has days where she can empty ok and days where she needs to use an Enema to stimulate a BM, feels that it is just the insertion of the enema, not the solution that does the job. As of 11/9: Pt. not having constipation due to colonic infection that has shifted her toward diarrhea. Having BM's daily, sometimes type 5.    Time 10    Period Weeks    Status On-going    Target Date 05/04/20                 Plan - 03/02/20 1139    Clinical Impression Statement Pt. Responded well to all interventions today, demonstrating improved seated posture and pain  reduction down to 3/10 in both bladder and shoulders/back, as well as understanding and correct performance of all education and exercises provided today. They will continue to benefit from skilled physical therapy to work toward remaining goals and maximize function as well as decrease likelihood of symptom increase or recurrence.    PT Next Visit Plan Ask about taping/re-apply, deep-core, scapular tracking/strength, and balance, MFR to lower abdomen, Review pain diary, cupping to medial thighs and upper back, re-assess neural glides on LLE, focus on graded activity, and seated and standing balance/core strength. review I's Y's and T's in prone, thoracic mobility? perform further release and PNF for R>L shoulder blades, more scar-release and cupping at suprapubic scar, MFR around B ilium. continue to work on thoracolumbar rotation ROM and decreasing spasms, add SLS balance training, .    PT Home Exercise Plan A day: low back stretch, side-stretch, hamstring stretch, butterfly stretch, hip EXT in prone with knee bent, side-lying hip ABD, calf raises. B day: Chest-stretch, Thoracic extensions over a towel roll, seated rows and seated scapular retraction (pull shoulder blades together like "T's") with band, child's pose stretch, self internal TP release (especially when Pelvic floor spasms high), Every day: Pain diary, Posterior pelvic tilts with deep core activation and red band for obliques activation, bow-and-arrow, TENS unit 3+ times per day for 30 min and as needed if pain starts to increase. TENS at ASIS/PSIS at strong tingle for 1 hr. per day. Sleeper stretch PRN    Consulted and Agree with Plan of Care Patient           Patient will benefit from skilled therapeutic intervention in order to improve the following deficits and impairments:     Visit Diagnosis: Other muscle spasm  Muscle weakness (generalized)  Difficulty in walking, not elsewhere classified     Problem List Patient Active  Problem List   Diagnosis Date Noted   Seizure ([place]) 11/11/2018   Major depressive disorder, recurrent episode, moderate ([place]) 02/07/2018   Nephrolithiasis 04/16/2016   Numbness 07/28/2015   Bladder retention 06/23/2015   Abdominal pain 06/04/2015   Dizziness 05/18/2015   Neck pain 81/19/1478   Complicated migraine 29/56/2130   Other fatigue 04/28/2015   Abnormal finding on MRI of brain 04/28/2015   D (diarrhea) 03/29/2015   H/O disease 03/29/2015   Abnormal weight loss 03/29/2015   Muscle weakness (generalized) 03/14/2015   Headache, migraine 03/10/2015   Willa Rough DPT, ATC Willa Rough 03/02/2020, 11:40 AM  [place] MAIN  [place], [place], [address] Phone: [phone]   Fax:  [phone]  Name: Christine Chavez MRN: [id number] Date of Birth: 06/26/1980

## 2020-03-04 ENCOUNTER — Ambulatory Visit: Payer: BC Managed Care – PPO

## 2020-03-04 ENCOUNTER — Other Ambulatory Visit: Payer: Self-pay

## 2020-03-04 DIAGNOSIS — M62838 Other muscle spasm: Secondary | ICD-10-CM

## 2020-03-04 DIAGNOSIS — R262 Difficulty in walking, not elsewhere classified: Secondary | ICD-10-CM

## 2020-03-04 DIAGNOSIS — M6281 Muscle weakness (generalized): Secondary | ICD-10-CM

## 2020-03-04 NOTE — Therapy (Signed)
[place] MAIN [facility] [address] [place], [place], [address] Phone: [phone]   Fax:  [phone]  Physical Therapy Treatment  The patient has been informed of current processes in place at Outpatient Rehab to protect patients from Covid-19 exposure including social distancing, schedule modifications, and new cleaning procedures. After discussing their particular risk with a therapist based on the patient's personal risk factors, the patient has decided to proceed with in-person therapy.  Patient Details  Name: Christine Chavez MRN: [id number] Date of Birth: [birth year] Referring Provider (PT): [name]   Encounter Date: 03/04/2020   PT End of Session - 03/04/20 1235    Visit Number 46    Number of Visits 60    Date for PT Re-Evaluation 05/04/20    Authorization Type [employer]    Authorization Time Period from 02/24/20 through 05/04/20    Authorization - Visit Number 23    Authorization - Number of Visits 30    Progress Note Due on Visit 15    PT Start Time 0930    PT Stop Time 1030    PT Time Calculation (min) 60 min    Activity Tolerance Patient tolerated treatment well;No increased pain    Behavior During Therapy WFL for tasks assessed/performed           Past Medical History:  Diagnosis Date  . Complication of anesthesia    ? seizures after anesthesia   . Headache   . Migraines   . Neurogenic bladder   . Renal disorder   . Vision abnormalities     Past Surgical History:  Procedure Laterality Date  . ANTERIOR CRUCIATE LIGAMENT REPAIR  1997  . APPENDECTOMY    . COLONOSCOPY WITH PROPOFOL N/A 11/11/2018   Procedure: COLONOSCOPY WITH PROPOFOL;  Surgeon: [name], MD;  Location: [facility] ENDOSCOPY;  Service: Endoscopy;  Laterality: N/A;  . CYSTOSCOPY WITH STENT PLACEMENT Right 04/17/2016   Procedure: CYSTOSCOPY WITH STENT PLACEMENT;  Surgeon: [name], MD;  Location: [facility] ORS;  Service:  Urology;  Laterality: Right;  . ESOPHAGOGASTRODUODENOSCOPY (EGD) WITH PROPOFOL N/A 11/11/2018   Procedure: ESOPHAGOGASTRODUODENOSCOPY (EGD) WITH PROPOFOL;  Surgeon: [name], MD;  Location: [facility] ENDOSCOPY;  Service: Endoscopy;  Laterality: N/A;  . EXPLORATORY LAPAROTOMY  1999  . KIDNEY STONE SURGERY  04/2016  . REVISION UROSTOMY CUTANEOUS    . REVISION UROSTOMY CUTANEOUS  01/10/2018  . SUPRAPUBIC CATHETER PLACEMENT  08/2017  . TONSILLECTOMY      There were no vitals filed for this visit.   Pelvic Floor Physical Therapy Treatment Note  SCREENING  Changes in medications, allergies, or medical history?: Is on a new antibiotic that is in a powder form (Monurol) and a new maintenance medication (Hiprex) that is not an antibiotic. Also given a prescription for vaginal valium.   SUBJECTIVE  Patient reports: She went to back-to-back basketball games for her son after her visit on Tuesday. She did not have any way to get to the bathroom so she waited longer than normal to empty and it is hurting at the end of bladder emptying. Drank a lot of water and had to get up 3 times at night to empty her bladder that night, 1 time last night. Has not had any odor to the urine but has been having increased bladder spasms/pain. Does feel like the tape helped, especially at the games.  Precautions:  Neurological diagnosis pending, has "non-specific" brain lesions   Pain update: (numbers not  updated due to Pt. Difficulty describing sensation) Location of pain: LB and neck/shoulders (bladder) Current pain:  4/10) (5/10) Max pain: 7/10 (7/10 when it spasms) Least pain:  3/10 (4/10) Nature of pain: sharp to dull ache (grabbing)  **some bladder twitches from TPDN remaining at end of session, no increased pain in the back.  Patient Goals: Get rid of the discomfort with catheterizing and ache/bladder pain. Be able to take fewer medications and be able to enjoy spending time with her family again,  doing "all the mom things"    OBJECTIVE  Changes in:  Observations/Posture:  L lumbar curve prominent with transverse processes more prominent on the L. (from previous note)  C3 spinous process deviated R from tension through R cervical extensors.   Range of Motion/Flexibilty:  (from 12/10) Spine: ~ 2-3 fingers from knee with SB B. ~ 50% reduced R rotation with stretch in L, ~ 75% reduced L rotation with increased tightness. HS: able to achieve 90 deg. Hip flexion in seated HS stretch. (from prior treatment)   Strength/MMT:  LE MMT (from 12-10) LE MMT Left Right  Hip flex:  (L2) /5 /5  Hip ext: 3+/5 3+/5  Hip abd: 4/5 4/5  Hip add: 4/5 3+/5  Hip IR 4/5 3+/5  Hip ER 4/5 4/5   (From 3/24): Pt. Demonstrates 3/5 strength with "I's, Y's, and T's" but 4/5 with W's. (from 6/22) DF: 9 reps with B. Eversion/Inversion: 4/5 on R, 4+/5 on L. DF: L 4+/5, R 3+/5  Seated IR/ER on L: 4/5, R 3+/5 (from 8-5) L DF and Eversion 4+/5 with first 1-3 reps and fatigue decreased to 4/4. (9/9) Pt. Demonstrates ~ 4+/5 B with increased spasticity B.   Balance/stability:  (6-29) Pt. Able to achieve good posture without cueing but only able to maintain for ~ 2.5 min. Before seeking stability from the table and forward rolled shoulders. With cue to engage the deep core, Pt. Recognizes increased stability and comfort.  8/5: Pt. Demonstrating decreased stability in gait and standing balance today with fasciculations increasing in direct relation to muscular endurance required. Able to stand ~ 10 sec. In SLS with MIN UE support before intensity of spasms make attempt at balance futile.    Pelvic Floor External Exam: (From prior session)- [Introitus Appears: elevated Skin integrity: WNL Palpation: TTP to B STP Cough: limited motion Prolapse visible?: no Scar mobility: not assessed  Internal Vaginal Exam: Strength (PERF): Pt. Has difficulty sensing the PFM to generate contraction or relaxation due to  high tone/neuro involvement Symmetry: L>R for spasm/TTP Palpation: TTP to all muscles throughout B Prolapse: none]  unable to coordinate squeeze and TTP to all areas with the greatest at B OI and coccygeus. ** following TP release Pt. Able to achieve 1/5 PFM contraction. Decreased TTP at B anterior PR/PC following treatment. (10/14).  TODAY: not re-assessed today.  Abdominal:  Pt. Demonstrated ability to recruit R>L obliques and TA with pelvic tilts. (from prior session)  Palpation: Decreased fascial and scar mobility through lower abdomen with radiating Sx. To the pelvis and LB. (from prior session)  TODAY: TTPB pec major and upper trapezius as well as L lumbar extensors and multifidus   Gait Analysis:  INTERVENTIONS THIS SESSION:  Manual: Performed TP release and STM to L iliacus and MFR along suprapubic scar to improve scar organization and length and decrease spasm and pain and allow for improved balance of musculature for improved function and decreased symptoms.  Dry-needle: Performed TPDN with a 4 .25x26mm  needles and one .30x27mm needle and standard approach as described below to decrease spasm and pain, assist in decreasing adhesions/scars and allow for improved balance of musculature for improved function and decreased symptoms.  Theract: Removed and re-applied kinesiotape for upper crossed syndrome to allow for continued re-enforcement of good postural alignment especially when out of the home in her wheelchair to decrease spasm and pain at the T/L junction which will help allow for decreased PFM and bladder spasm and pain.   Total time: 60 min.                     Trigger Point Dry Needling - 03/04/20 0001    Consent Given? Yes    Education Handout Provided No    Muscles Treated Back/Hip Iliacus    Dry Needling Comments Left     Other Dry Needling 4 points along suprapubic scar    Iliacus Response Twitch response elicited;Palpable increased muscle  length                  PT Short Term Goals - 02/24/20 1142      PT SHORT TERM GOAL #1   Title Patient will demonstrate coordinated diaphragmatic breathing with pelvic tilts to demonstrate improved control of diaphragm and TA, to allow for further strengthening of core musculature and decreased pelvic floor spasm.    Baseline Pt. demonstrates breathing dysfunction and poor PFM coordination evidenced by anal manometry As of 2/22: Pt. continues to have restriction in her diaphragm and near T/L junction but is able to intentionally use diaphragmatic breathing through the available ROM.    Time 5    Period Weeks    Status Achieved    Target Date 04/20/19      PT SHORT TERM GOAL #2   Title Patient will report a reduction in pain to no greater than 6/10 over the prior week to demonstrate symptom improvement.    Baseline Pain is 10/10 at worst, 2/10 at best As of 2/22: Pt. had achieved this goal for 1 week as of 1/21 but may have infection or other insult that caused pain to increase again. As of 4/29: Pain high of 8/10 but has been over-doing her activity over the past week. As of 6/10: Pt. was able to keep pain at 6 or below since prior visit by using TENS to help keep tension lower but has not had a full week to test if it will keep working. As of 7/6: Pt. had met this goal but has a new UTI and it caused pain to spike to 8/10 over the weekend. As of 11/9: Pt. has just finally made progress with long-standing UTI and new POC for treatment so she is still having pain spikes up to 8/10 but we expect greater improvement over the next few months.    Time 5    Period Weeks    Status On-going    Target Date 03/30/20      PT SHORT TERM GOAL #3   Title Patient will demonstrate HEP x1 in the clinic to demonstrate understanding and proper form to allow for further improvement.    Baseline Pt. lacks knowledge of therepeutic exercises that can decrease her pain/Sx.    Time 5    Period Weeks     Status Achieved    Target Date 04/20/19      PT SHORT TERM GOAL #4   Title Patient will report consistent use of foot-stool (squatty-potty) for positioning with BM  to decrease pain with BM and intra-abdominal pressure.    Baseline Pt. having constipation due to PFM dysfunction    Time 5    Period Weeks    Status Achieved    Target Date 04/20/19             PT Long Term Goals - 02/24/20 0001      PT LONG TERM GOAL #1   Title Pt. will be able to participate in regular ADL's with least restrictive device without pain increasing greater than 2/10    Baseline Pt. limited in her ability to perform household duties by increased pain and fatigue. As of 4/29: Her legs have more endurance with walking before she gets a tremor. Still needs to sit down the majority of the time she is cooking. Still needs back support for prolonged sitting. As of 6/10: Pt. able to do for  ~1 hour before she needs to rest to prevent increased pain, pain is averaging ~ 4-5/10    Time 10    Period Weeks    Status On-going    Target Date 05/04/20      PT LONG TERM GOAL #2   Title Patient will score at or below 65/300  on the PFDI and 35% on the Female NIH-CPSI to demonstrate a clinically meaningful decrease in disability and distress due to pelvic floor dysfunction.    Baseline PFDI: 110/300, Female NIH-CPSI: 29/43 (67%) As of 4/29: 100/300 on PFDI and 30/43 on NIH-CPSI As of 7/6:  NIH-CPSI: 32/43 POPDI: 61/100, CRADI-8: 29/100, UDI: 44/100 (PFDI: 149/300)    Time 10    Period Weeks    Status On-going    Target Date 05/04/20      PT LONG TERM GOAL #3   Title Patient will report no pain with intercourse to demonstrate improved functional ability.    Baseline Pt. Having significant pain with intercourse. As of 4/29: is a little better (20-30%) but is worse on days where overall tension/pain is worse. As of 6/10: can have days where there is not much pain but other days where there is significant pain. As of 11/9:  same as previous update.    Time 10    Period Weeks    Status On-going    Target Date 05/04/20      PT LONG TERM GOAL #4   Title Pt will report ability to work in her yard for greater than 30 minutes without extreme fatigue    Baseline limited to 10-15 minutes before pt requires ending activity. As of 4/29: ~ 20 min. As of 6/10: has not been gardening much but can do ~ 30 min of similar indoor activity level before increased fatigue.    Time 10    Period Weeks    Status Achieved    Target Date 10/23/19      PT LONG TERM GOAL #5   Title Pt. will be able to go 2-3 hours between emptying her bladder without increased pain and empty her bladder fully whether by urostomy, cath, or voluntary release.    Baseline Pt. has a urostomy but continues to have to self-catheterize. Has pain with bladder filling and when using catheter. As of 4/29: is doing better since starting to take the pregabalin andd baclofen but occasionally still has pain with bladder filling. It is bad when she has an infection or over-does activity, better on "normal" days which have been few and far between due to frequent UTI's. As of 6/10: Pt. has "  good" and "bad" days but has more days where she can go 2-3 hours without increased pain    Time 10    Period Weeks    Status On-going    Target Date 05/04/20      PT LONG TERM GOAL #6   Title Patient will report having BM's at least every-other day with consistency between [facility] stool scale 3-5 over the prior week to demonstrate decreased constipation.    Baseline Pt. unable to have regula BM's without medication, manometry shows PFM dysfunction. As of 4/29: still having to use and enema or supository occasionally and still needing linzess. As of 6/10: Pt. has days where she can empty ok and days where she needs to use an Enema to stimulate a BM, feels that it is just the insertion of the enema, not the solution that does the job. As of 11/9: Pt. not having constipation due to  colonic infection that has shifted her toward diarrhea. Having BM's daily, sometimes type 5.    Time 10    Period Weeks    Status On-going    Target Date 05/04/20                 Plan - 03/04/20 1235    Clinical Impression Statement Pt. Responded well to all interventions today, demonstrating improved posture and decreased spasm and scar length/mobility, as well as understanding and correct performance of all education and exercises provided today. They will continue to benefit from skilled physical therapy to work toward remaining goals and maximize function as well as decrease likelihood of symptom increase or recurrence.    PT Next Visit Plan Ask about taping/re-apply, further suprapubic scar work, deep-core, scapular tracking/strength, and balance, MFR to lower abdomen, Review pain diary, cupping to medial thighs and upper back, re-assess neural glides on LLE, focus on graded activity, and seated and standing balance/core strength. review I's Y's and T's in prone, thoracic mobility? perform further release and PNF for R>L shoulder blades, more scar-release and cupping at suprapubic scar, MFR around B ilium. continue to work on thoracolumbar rotation ROM and decreasing spasms, add SLS balance training, .    PT Home Exercise Plan A day: low back stretch, side-stretch, hamstring stretch, butterfly stretch, hip EXT in prone with knee bent, side-lying hip ABD, calf raises. B day: Chest-stretch, Thoracic extensions over a towel roll, seated rows and seated scapular retraction (pull shoulder blades together like "T's") with band, child's pose stretch, self internal TP release (especially when Pelvic floor spasms high), Every day: Pain diary, Posterior pelvic tilts with deep core activation and red band for obliques activation, bow-and-arrow, TENS unit 3+ times per day for 30 min and as needed if pain starts to increase. TENS at ASIS/PSIS at strong tingle for 1 hr. per day. Sleeper stretch PRN     Consulted and Agree with Plan of Care Patient           Patient will benefit from skilled therapeutic intervention in order to improve the following deficits and impairments:     Visit Diagnosis: Other muscle spasm  Muscle weakness (generalized)  Difficulty in walking, not elsewhere classified     Problem List Patient Active Problem List   Diagnosis Date Noted  . Seizure ([place]) 11/11/2018  . Major depressive disorder, recurrent episode, moderate ([place]) 02/07/2018  . Nephrolithiasis 04/16/2016  . Numbness 07/28/2015  . Bladder retention 06/23/2015  . Abdominal pain 06/04/2015  . Dizziness 05/18/2015  . Neck pain 05/18/2015  . Complicated  migraine 04/28/2015  . Other fatigue 04/28/2015  . Abnormal finding on MRI of brain 04/28/2015  . D (diarrhea) 03/29/2015  . H/O disease 03/29/2015  . Abnormal weight loss 03/29/2015  . Muscle weakness (generalized) 03/14/2015  . Headache, migraine 03/10/2015   Willa Rough DPT, ATC Willa Rough 03/04/2020, 12:37 PM  [place] MAIN [facility] [address] [place], [place], [address] Phone: [phone]   Fax:  [phone]  Name: Alexys Lobello MRN: [id number] Date of Birth: 1980/09/19

## 2020-03-09 ENCOUNTER — Other Ambulatory Visit: Payer: Self-pay

## 2020-03-09 ENCOUNTER — Ambulatory Visit: Payer: BC Managed Care – PPO

## 2020-03-09 DIAGNOSIS — M62838 Other muscle spasm: Secondary | ICD-10-CM

## 2020-03-09 DIAGNOSIS — M6281 Muscle weakness (generalized): Secondary | ICD-10-CM

## 2020-03-09 DIAGNOSIS — R262 Difficulty in walking, not elsewhere classified: Secondary | ICD-10-CM

## 2020-03-09 NOTE — Therapy (Signed)
[place] MAIN [facility] SERVICES [address] [place], [place], [address] Phone: [phone]   Fax:  [phone]  Physical Therapy Treatment  The patient has been informed of current processes in place at Outpatient Rehab to protect patients from Covid-19 exposure including social distancing, schedule modifications, and new cleaning procedures. After discussing their particular risk with a therapist based on the patient's personal risk factors, the patient has decided to proceed with in-person therapy.  Patient Details  Name: Christine Chavez MRN: [id number] Date of Birth: 02-22-1981 Referring Provider (PT): [name]   Encounter Date: 03/09/2020   PT End of Session - 03/09/20 1602    Visit Number 72    Number of Visits 69    Date for PT Re-Evaluation 05/04/20    Authorization Type [employer]    Authorization Time Period from 02/24/20 through 05/04/20    Authorization - Visit Number 24    Authorization - Number of Visits 30    Progress Note Due on Visit 59    PT Start Time 1030    PT Stop Time 1130    PT Time Calculation (min) 60 min    Activity Tolerance Patient tolerated treatment well;No increased pain    Behavior During Therapy WFL for tasks assessed/performed           Past Medical History:  Diagnosis Date  . Complication of anesthesia    ? seizures after anesthesia   . Headache   . Migraines   . Neurogenic bladder   . Renal disorder   . Vision abnormalities     Past Surgical History:  Procedure Laterality Date  . ANTERIOR CRUCIATE LIGAMENT REPAIR  1997  . APPENDECTOMY    . COLONOSCOPY WITH PROPOFOL N/A 11/11/2018   Procedure: COLONOSCOPY WITH PROPOFOL;  Surgeon: [name], MD;  Location: [facility] ENDOSCOPY;  Service: Endoscopy;  Laterality: N/A;  . CYSTOSCOPY WITH STENT PLACEMENT Right 04/17/2016   Procedure: CYSTOSCOPY WITH STENT PLACEMENT;  Surgeon: [name], MD;  Location: [facility] ORS;  Service:  Urology;  Laterality: Right;  . ESOPHAGOGASTRODUODENOSCOPY (EGD) WITH PROPOFOL N/A 11/11/2018   Procedure: ESOPHAGOGASTRODUODENOSCOPY (EGD) WITH PROPOFOL;  Surgeon: [name], MD;  Location: [facility] ENDOSCOPY;  Service: Endoscopy;  Laterality: N/A;  . EXPLORATORY LAPAROTOMY  1999  . KIDNEY STONE SURGERY  04/2016  . REVISION UROSTOMY CUTANEOUS    . REVISION UROSTOMY CUTANEOUS  01/10/2018  . SUPRAPUBIC CATHETER PLACEMENT  08/2017  . TONSILLECTOMY      There were no vitals filed for this visit.  Pelvic Floor Physical Therapy Treatment Note  SCREENING  Changes in medications, allergies, or medical history?: Is on a new antibiotic that is in a powder form (Monurol) and a new maintenance medication (Hiprex) that is not an antibiotic. Also given a prescription for vaginal valium.   SUBJECTIVE  Patient reports: She had a girls trip which went pretty well other than her having increased nausea and she was sore because she spent too long sitting upright and her postural muscles got fatigued. Not sure if the nausea is from the new medicine or eating foods she does not regularly eat. The tape irritated her back some so she had to remove it early. Her stoma has also been irritated and bleeding some and the bags have been leaking some, she thinks the box may all be defective. Her neck and down the back is tired and achy. Saturday night she did a vaginal valium and she did use her  TENS unit which helped her not have bladder filling pain. Feels like irritation has been lower the last couple days.   Precautions:  Neurological diagnosis pending, has "non-specific" brain lesions   Pain update: (numbers not updated due to Pt. Difficulty describing sensation) Location of pain: LB and neck/shoulders (bladder) Current pain:  4/10) (5/10) Max pain: 7/10 (7/10 when it spasms) Least pain:  3/10 (4/10) Nature of pain: sharp to dull ache (grabbing)  **improved posture/thoracic extension ROM following  treatment, no increased pain.  Patient Goals: Get rid of the discomfort with catheterizing and ache/bladder pain. Be able to take fewer medications and be able to enjoy spending time with her family again, doing "all the mom things"    OBJECTIVE  Changes in:  Observations/Posture:  L lumbar curve prominent with transverse processes more prominent on the L. (from previous note)  C3 spinous process deviated R from tension through R cervical extensors. (from prior note)   Range of Motion/Flexibilty:  (from 12/10) Spine: ~ 2-3 fingers from knee with SB B. ~ 50% reduced R rotation with stretch in L, ~ 75% reduced L rotation with increased tightness. HS: able to achieve 90 deg. Hip flexion in seated HS stretch. (from prior treatment)  Decreased PIVM through Thoracic, lumbar, and sacral spine with greatest restriction at T3-6.  Strength/MMT:  LE MMT (from 12-10) LE MMT Left Right  Hip flex:  (L2) /5 /5  Hip ext: 3+/5 3+/5  Hip abd: 4/5 4/5  Hip add: 4/5 3+/5  Hip IR 4/5 3+/5  Hip ER 4/5 4/5   (From 3/24): Pt. Demonstrates 3/5 strength with "I's, Y's, and T's" but 4/5 with W's. (from 6/22) DF: 9 reps with B. Eversion/Inversion: 4/5 on R, 4+/5 on L. DF: L 4+/5, R 3+/5  Seated IR/ER on L: 4/5, R 3+/5 (from 8-5) L DF and Eversion 4+/5 with first 1-3 reps and fatigue decreased to 4/4. (9/9) Pt. Demonstrates ~ 4+/5 B with increased spasticity B.   Balance/stability:  (6-29) Pt. Able to achieve good posture without cueing but only able to maintain for ~ 2.5 min. Before seeking stability from the table and forward rolled shoulders. With cue to engage the deep core, Pt. Recognizes increased stability and comfort.  8/5: Pt. Demonstrating decreased stability in gait and standing balance today with fasciculations increasing in direct relation to muscular endurance required. Able to stand ~ 10 sec. In SLS with MIN UE support before intensity of spasms make attempt at balance futile.     Pelvic Floor External Exam: (From prior session)- [Introitus Appears: elevated Skin integrity: WNL Palpation: TTP to B STP Cough: limited motion Prolapse visible?: no Scar mobility: not assessed  Internal Vaginal Exam: Strength (PERF): Pt. Has difficulty sensing the PFM to generate contraction or relaxation due to high tone/neuro involvement Symmetry: L>R for spasm/TTP Palpation: TTP to all muscles throughout B Prolapse: none]  unable to coordinate squeeze and TTP to all areas with the greatest at B OI and coccygeus. ** following TP release Pt. Able to achieve 1/5 PFM contraction. Decreased TTP at B anterior PR/PC following treatment. (10/14).  TODAY: not re-assessed today.  Abdominal:  Pt. Demonstrated ability to recruit R>L obliques and TA with pelvic tilts. (from prior session)  Palpation: Decreased fascial and scar mobility through lower abdomen with radiating Sx. To the pelvis and LB. (from prior session)  TODAY: TTP to B SCM  Gait Analysis:  INTERVENTIONS THIS SESSION:  Manual: Performed PA mobs from C7 to the tailbone and TP  release at sub-occipitals with gentle cervical traction to to improve mobility of joint and surrounding connective tissue and decrease pressure on nerve roots for improved conductivity and function of down-stream tissues.   Therex: reviewed and practiced prone I's, Y's, T's and W's to improve scapular stabilizer strength and help protect against return to protracted posture.  Self-care: Discussed success with TENS and DN with bladder spasm and how to use posturific brace to help prevent return of stiffness and pain during upcoming basketball games, etc.    Total time: 60 min.                                    PT Short Term Goals - 02/24/20 1142      PT SHORT TERM GOAL #1   Title Patient will demonstrate coordinated diaphragmatic breathing with pelvic tilts to demonstrate improved control of diaphragm and  TA, to allow for further strengthening of core musculature and decreased pelvic floor spasm.    Baseline Pt. demonstrates breathing dysfunction and poor PFM coordination evidenced by anal manometry As of 2/22: Pt. continues to have restriction in her diaphragm and near T/L junction but is able to intentionally use diaphragmatic breathing through the available ROM.    Time 5    Period Weeks    Status Achieved    Target Date 04/20/19      PT SHORT TERM GOAL #2   Title Patient will report a reduction in pain to no greater than 6/10 over the prior week to demonstrate symptom improvement.    Baseline Pain is 10/10 at worst, 2/10 at best As of 2/22: Pt. had achieved this goal for 1 week as of 1/21 but may have infection or other insult that caused pain to increase again. As of 4/29: Pain high of 8/10 but has been over-doing her activity over the past week. As of 6/10: Pt. was able to keep pain at 6 or below since prior visit by using TENS to help keep tension lower but has not had a full week to test if it will keep working. As of 7/6: Pt. had met this goal but has a new UTI and it caused pain to spike to 8/10 over the weekend. As of 11/9: Pt. has just finally made progress with long-standing UTI and new POC for treatment so she is still having pain spikes up to 8/10 but we expect greater improvement over the next few months.    Time 5    Period Weeks    Status On-going    Target Date 03/30/20      PT SHORT TERM GOAL #3   Title Patient will demonstrate HEP x1 in the clinic to demonstrate understanding and proper form to allow for further improvement.    Baseline Pt. lacks knowledge of therepeutic exercises that can decrease her pain/Sx.    Time 5    Period Weeks    Status Achieved    Target Date 04/20/19      PT SHORT TERM GOAL #4   Title Patient will report consistent use of foot-stool (squatty-potty) for positioning with BM to decrease pain with BM and intra-abdominal pressure.    Baseline Pt.  having constipation due to PFM dysfunction    Time 5    Period Weeks    Status Achieved    Target Date 04/20/19             PT Long  Term Goals - 02/24/20 0001      PT LONG TERM GOAL #1   Title Pt. will be able to participate in regular ADL's with least restrictive device without pain increasing greater than 2/10    Baseline Pt. limited in her ability to perform household duties by increased pain and fatigue. As of 4/29: Her legs have more endurance with walking before she gets a tremor. Still needs to sit down the majority of the time she is cooking. Still needs back support for prolonged sitting. As of 6/10: Pt. able to do for  ~1 hour before she needs to rest to prevent increased pain, pain is averaging ~ 4-5/10    Time 10    Period Weeks    Status On-going    Target Date 05/04/20      PT LONG TERM GOAL #2   Title Patient will score at or below 65/300  on the PFDI and 35% on the Female NIH-CPSI to demonstrate a clinically meaningful decrease in disability and distress due to pelvic floor dysfunction.    Baseline PFDI: 110/300, Female NIH-CPSI: 29/43 (67%) As of 4/29: 100/300 on PFDI and 30/43 on NIH-CPSI As of 7/6:  NIH-CPSI: 32/43 POPDI: 61/100, CRADI-8: 29/100, UDI: 44/100 (PFDI: 149/300)    Time 10    Period Weeks    Status On-going    Target Date 05/04/20      PT LONG TERM GOAL #3   Title Patient will report no pain with intercourse to demonstrate improved functional ability.    Baseline Pt. Having significant pain with intercourse. As of 4/29: is a little better (20-30%) but is worse on days where overall tension/pain is worse. As of 6/10: can have days where there is not much pain but other days where there is significant pain. As of 11/9: same as previous update.    Time 10    Period Weeks    Status On-going    Target Date 05/04/20      PT LONG TERM GOAL #4   Title Pt will report ability to work in her yard for greater than 30 minutes without extreme fatigue     Baseline limited to 10-15 minutes before pt requires ending activity. As of 4/29: ~ 20 min. As of 6/10: has not been gardening much but can do ~ 30 min of similar indoor activity level before increased fatigue.    Time 10    Period Weeks    Status Achieved    Target Date 10/23/19      PT LONG TERM GOAL #5   Title Pt. will be able to go 2-3 hours between emptying her bladder without increased pain and empty her bladder fully whether by urostomy, cath, or voluntary release.    Baseline Pt. has a urostomy but continues to have to self-catheterize. Has pain with bladder filling and when using catheter. As of 4/29: is doing better since starting to take the pregabalin andd baclofen but occasionally still has pain with bladder filling. It is bad when she has an infection or over-does activity, better on "normal" days which have been few and far between due to frequent UTI's. As of 6/10: Pt. has "good" and "bad" days but has more days where she can go 2-3 hours without increased pain    Time 10    Period Weeks    Status On-going    Target Date 05/04/20      PT LONG TERM GOAL #6   Title Patient will report  having BM's at least every-other day with consistency between [facility] stool scale 3-5 over the prior week to demonstrate decreased constipation.    Baseline Pt. unable to have regula BM's without medication, manometry shows PFM dysfunction. As of 4/29: still having to use and enema or supository occasionally and still needing linzess. As of 6/10: Pt. has days where she can empty ok and days where she needs to use an Enema to stimulate a BM, feels that it is just the insertion of the enema, not the solution that does the job. As of 11/9: Pt. not having constipation due to colonic infection that has shifted her toward diarrhea. Having BM's daily, sometimes type 5.    Time 10    Period Weeks    Status On-going    Target Date 05/04/20                 Plan - 03/09/20 1602    Clinical Impression  Statement Pt. Responded well to all interventions today, demonstrating improved thoracic mobility and ability to recruit scapular stabilizers to perform HEP, as well as understanding and correct performance of all education and exercises provided today. They will continue to benefit from skilled physical therapy to work toward remaining goals and maximize function as well as decrease likelihood of symptom increase or recurrence.    PT Next Visit Plan Ask about taping/re-apply, further suprapubic scar work, deep-core, scapular tracking/strength, and balance, MFR to lower abdomen, Review pain diary, cupping to medial thighs and upper back, re-assess neural glides on LLE, focus on graded activity, and seated and standing balance/core strength. review I's Y's and T's in prone, thoracic mobility? perform further release and PNF for R>L shoulder blades, more scar-release and cupping at suprapubic scar, MFR around B ilium. continue to work on thoracolumbar rotation ROM and decreasing spasms, add SLS balance training, .    PT Home Exercise Plan A day: low back stretch, side-stretch, hamstring stretch, butterfly stretch, hip EXT in prone with knee bent, side-lying hip ABD, calf raises. B day: Chest-stretch, Thoracic extensions over a towel roll, seated rows and seated scapular retraction (pull shoulder blades together like "T's") with band, child's pose stretch, self internal TP release (especially when Pelvic floor spasms high), Every day: Pain diary, Posterior pelvic tilts with deep core activation and red band for obliques activation, bow-and-arrow, TENS unit 3+ times per day for 30 min and as needed if pain starts to increase. TENS at ASIS/PSIS at strong tingle for 1 hr. per day. Sleeper stretch PRN    Consulted and Agree with Plan of Care Patient           Patient will benefit from skilled therapeutic intervention in order to improve the following deficits and impairments:     Visit Diagnosis: Other muscle  spasm  Muscle weakness (generalized)  Difficulty in walking, not elsewhere classified     Problem List Patient Active Problem List   Diagnosis Date Noted  . Seizure ([place]) 11/11/2018  . Major depressive disorder, recurrent episode, moderate ([place]) 02/07/2018  . Nephrolithiasis 04/16/2016  . Numbness 07/28/2015  . Bladder retention 06/23/2015  . Abdominal pain 06/04/2015  . Dizziness 05/18/2015  . Neck pain 05/18/2015  . Complicated migraine 65/06/5463  . Other fatigue 04/28/2015  . Abnormal finding on MRI of brain 04/28/2015  . D (diarrhea) 03/29/2015  . H/O disease 03/29/2015  . Abnormal weight loss 03/29/2015  . Muscle weakness (generalized) 03/14/2015  . Headache, migraine 03/10/2015   Willa Rough DPT, ATC  Willa Rough 03/09/2020, 4:10 PM  [place] MAIN [facility] SERVICES [address] [place], [place], [address] Phone: [phone]   Fax:  [phone]  Name: Logen Fowle MRN: [id number] Date of Birth: [birth year]

## 2020-03-16 ENCOUNTER — Other Ambulatory Visit: Payer: Self-pay

## 2020-03-16 ENCOUNTER — Ambulatory Visit: Payer: BC Managed Care – PPO

## 2020-03-16 DIAGNOSIS — M62838 Other muscle spasm: Secondary | ICD-10-CM | POA: Diagnosis not present

## 2020-03-16 DIAGNOSIS — M6281 Muscle weakness (generalized): Secondary | ICD-10-CM

## 2020-03-16 DIAGNOSIS — R262 Difficulty in walking, not elsewhere classified: Secondary | ICD-10-CM

## 2020-03-16 NOTE — Therapy (Addendum)
[place] MAIN [facility] SERVICES [address] [place], [place], [address] Phone: [phone]   Fax:  [phone]  Physical Therapy Treatment  The patient has been informed of current processes in place at Outpatient Rehab to protect patients from Covid-19 exposure including social distancing, schedule modifications, and new cleaning procedures. After discussing their particular risk with a therapist based on the patient's personal risk factors, the patient has decided to proceed with in-person therapy.  Patient Details  Name: Christine Chavez MRN: [id number] Date of Birth: 1980-07-15 No data recorded  Encounter Date: 03/16/2020   PT End of Session - 03/18/20 0756    Visit Number 53    Number of Visits 12    Date for PT Re-Evaluation 05/04/20    Authorization Type [employer]    Authorization Time Period from 02/24/20 through 05/04/20    Authorization - Visit Number 25    Authorization - Number of Visits 30    Progress Note Due on Visit 17    PT Start Time 1030    PT Stop Time 1130    PT Time Calculation (min) 60 min    Activity Tolerance Patient tolerated treatment well;No increased pain    Behavior During Therapy WFL for tasks assessed/performed           Past Medical History:  Diagnosis Date  . Complication of anesthesia    ? seizures after anesthesia   . Headache   . Migraines   . Neurogenic bladder   . Renal disorder   . Vision abnormalities     Past Surgical History:  Procedure Laterality Date  . ANTERIOR CRUCIATE LIGAMENT REPAIR  1997  . APPENDECTOMY    . COLONOSCOPY WITH PROPOFOL N/A 11/11/2018   Procedure: COLONOSCOPY WITH PROPOFOL;  Surgeon: [name], MD;  Location: [facility] ENDOSCOPY;  Service: Endoscopy;  Laterality: N/A;  . CYSTOSCOPY WITH STENT PLACEMENT Right 04/17/2016   Procedure: CYSTOSCOPY WITH STENT PLACEMENT;  Surgeon: [name], MD;  Location: [facility] ORS;  Service: Urology;  Laterality: Right;  .  ESOPHAGOGASTRODUODENOSCOPY (EGD) WITH PROPOFOL N/A 11/11/2018   Procedure: ESOPHAGOGASTRODUODENOSCOPY (EGD) WITH PROPOFOL;  Surgeon: [name], MD;  Location: [facility] ENDOSCOPY;  Service: Endoscopy;  Laterality: N/A;  . EXPLORATORY LAPAROTOMY  1999  . KIDNEY STONE SURGERY  04/2016  . REVISION UROSTOMY CUTANEOUS    . REVISION UROSTOMY CUTANEOUS  01/10/2018  . SUPRAPUBIC CATHETER PLACEMENT  08/2017  . TONSILLECTOMY      There were no vitals filed for this visit.   Pelvic Floor Physical Therapy Treatment Note  SCREENING  Changes in medications, allergies, or medical history?: none  SUBJECTIVE  Patient reports: She has had Success using TENS at the basketball game, has had fewer bladder spasms this week. Has felt a little nausea still, thinks it is partly due to starting her period. Has been somewhat stressed because she is trying to plan a party for her mom.    Precautions:  Neurological diagnosis pending, has "non-specific" brain lesions   Pain update: (numbers not updated due to Pt. Difficulty describing sensation) Location of pain: LB and neck/shoulders (bladder) Current pain:  5/10) (2/10) Max pain: 7/10 (5/10 when it spasms) Least pain:  2/10 (2/10) Nature of pain: sharp to dull ache (grabbing)  **Pain 3/10 following treatment.  Patient Goals: Get rid of the discomfort with catheterizing and ache/bladder pain. Be able to take fewer medications and be able to enjoy spending time with her family again, doing "all the  mom things"    OBJECTIVE  Changes in:  Observations/Posture:  L lumbar curve prominent with transverse processes more prominent on the L. (from previous note)  C3 spinous process deviated R from tension through R cervical extensors. (from prior note)   Range of Motion/Flexibilty:  (from 03/27/19) Spine: ~ 2-3 fingers from knee with SB B. ~ 50% reduced R rotation with stretch in L, ~ 75% reduced L rotation with increased tightness. HS: able to  achieve 90 deg. Hip flexion in seated HS stretch. (from prior treatment)  Decreased PIVM through Thoracic, lumbar, and sacral spine with greatest restriction at T3-6. (from prior session)  Strength/MMT:  LE MMT (from 03-27-19) LE MMT Left Right  Hip flex:  (L2) /5 /5  Hip ext: 3+/5 3+/5  Hip abd: 4/5 4/5  Hip add: 4/5 3+/5  Hip IR 4/5 3+/5  Hip ER 4/5 4/5   (From 3/24): Pt. Demonstrates 3/5 strength with "I's, Y's, and T's" but 4/5 with W's. (from 6/22) DF: 9 reps with B. Eversion/Inversion: 4/5 on R, 4+/5 on L. DF: L 4+/5, R 3+/5  Seated IR/ER on L: 4/5, R 3+/5 (from 8-5) L DF and Eversion 4+/5 with first 1-3 reps and fatigue decreased to 4/4. (9/9) Pt. Demonstrates ~ 4+/5 B with increased spasticity B.   Balance/stability:  (6-29) Pt. Able to achieve good posture without cueing but only able to maintain for ~ 2.5 min. Before seeking stability from the table and forward rolled shoulders. With cue to engage the deep core, Pt. Recognizes increased stability and comfort.  8/5: Pt. Demonstrating decreased stability in gait and standing balance today with fasciculations increasing in direct relation to muscular endurance required. Able to stand ~ 10 sec. In SLS with MIN UE support before intensity of spasms make attempt at balance futile.    Pelvic Floor External Exam: (From prior session)- [Introitus Appears: elevated Skin integrity: WNL Palpation: TTP to B STP Cough: limited motion Prolapse visible?: no Scar mobility: not assessed  Internal Vaginal Exam: Strength (PERF): Pt. Has difficulty sensing the PFM to generate contraction or relaxation due to high tone/neuro involvement Symmetry: L>R for spasm/TTP Palpation: TTP to all muscles throughout B Prolapse: none]  unable to coordinate squeeze and TTP to all areas with the greatest at B OI and coccygeus. ** following TP release Pt. Able to achieve 1/5 PFM contraction. Decreased TTP at B anterior PR/PC following treatment.  (10/14).  TODAY: not re-assessed today.  Abdominal:  Pt. Demonstrated ability to recruit R>L obliques and TA with pelvic tilts. (from prior session)  Palpation: Decreased fascial and scar mobility through lower abdomen with radiating Sx. To the pelvis and LB. (from prior session)  TODAY: TTP to L erector spinae and QL at ~ L2 and multifidus at ~ L4-5  Gait Analysis:  INTERVENTIONS THIS SESSION:  Manual: Performed TP release and STM at  L erector spinae and QL at ~ L2 and multifidus at ~ L4-5 to decrease spasm and pain and allow for improved balance of musculature for improved function and decreased symptoms.  Dry-needle: Performed TPDN with a .30x72m needle and standard approach as described below to decrease spasm and pain and allow for improved balance of musculature for improved function and decreased symptoms.  Self-care: Educated on vagal nerve toning with "Mmmmmm" humming to decrease nausea and other Sx.   Total time: 60 min.  PT Short Term Goals - 02/24/20 1142      PT SHORT TERM GOAL #1   Title Patient will demonstrate coordinated diaphragmatic breathing with pelvic tilts to demonstrate improved control of diaphragm and TA, to allow for further strengthening of core musculature and decreased pelvic floor spasm.    Baseline Pt. demonstrates breathing dysfunction and poor PFM coordination evidenced by anal manometry As of 2/22: Pt. continues to have restriction in her diaphragm and near T/L junction but is able to intentionally use diaphragmatic breathing through the available ROM.    Time 5    Period Weeks    Status Achieved    Target Date 04/20/19      PT SHORT TERM GOAL #2   Title Patient will report a reduction in pain to no greater than 6/10 over the prior week to demonstrate symptom improvement.    Baseline Pain is 10/10 at worst, 2/10 at best As of 2/22: Pt. had achieved this goal for 1 week as of 1/21 but  may have infection or other insult that caused pain to increase again. As of 4/29: Pain high of 8/10 but has been over-doing her activity over the past week. As of 6/10: Pt. was able to keep pain at 6 or below since prior visit by using TENS to help keep tension lower but has not had a full week to test if it will keep working. As of 7/6: Pt. had met this goal but has a new UTI and it caused pain to spike to 8/10 over the weekend. As of 11/9: Pt. has just finally made progress with long-standing UTI and new POC for treatment so she is still having pain spikes up to 8/10 but we expect greater improvement over the next few months.    Time 5    Period Weeks    Status On-going    Target Date 03/30/20      PT SHORT TERM GOAL #3   Title Patient will demonstrate HEP x1 in the clinic to demonstrate understanding and proper form to allow for further improvement.    Baseline Pt. lacks knowledge of therepeutic exercises that can decrease her pain/Sx.    Time 5    Period Weeks    Status Achieved    Target Date 04/20/19      PT SHORT TERM GOAL #4   Title Patient will report consistent use of foot-stool (squatty-potty) for positioning with BM to decrease pain with BM and intra-abdominal pressure.    Baseline Pt. having constipation due to PFM dysfunction    Time 5    Period Weeks    Status Achieved    Target Date 04/20/19             PT Long Term Goals - 02/24/20 0001      PT LONG TERM GOAL #1   Title Pt. will be able to participate in regular ADL's with least restrictive device without pain increasing greater than 2/10    Baseline Pt. limited in her ability to perform household duties by increased pain and fatigue. As of 4/29: Her legs have more endurance with walking before she gets a tremor. Still needs to sit down the majority of the time she is cooking. Still needs back support for prolonged sitting. As of 6/10: Pt. able to do for  ~1 hour before she needs to rest to prevent increased pain,  pain is averaging ~ 4-5/10    Time 10    Period Weeks  Status On-going    Target Date 05/04/20      PT LONG TERM GOAL #2   Title Patient will score at or below 65/300  on the PFDI and 35% on the Female NIH-CPSI to demonstrate a clinically meaningful decrease in disability and distress due to pelvic floor dysfunction.    Baseline PFDI: 110/300, Female NIH-CPSI: 29/43 (67%) As of 4/29: 100/300 on PFDI and 30/43 on NIH-CPSI As of 7/6:  NIH-CPSI: 32/43 POPDI: 61/100, CRADI-8: 29/100, UDI: 44/100 (PFDI: 149/300)    Time 10    Period Weeks    Status On-going    Target Date 05/04/20      PT LONG TERM GOAL #3   Title Patient will report no pain with intercourse to demonstrate improved functional ability.    Baseline Pt. Having significant pain with intercourse. As of 4/29: is a little better (20-30%) but is worse on days where overall tension/pain is worse. As of 6/10: can have days where there is not much pain but other days where there is significant pain. As of 11/9: same as previous update.    Time 10    Period Weeks    Status On-going    Target Date 05/04/20      PT LONG TERM GOAL #4   Title Pt will report ability to work in her yard for greater than 30 minutes without extreme fatigue    Baseline limited to 10-15 minutes before pt requires ending activity. As of 4/29: ~ 20 min. As of 6/10: has not been gardening much but can do ~ 30 min of similar indoor activity level before increased fatigue.    Time 10    Period Weeks    Status Achieved    Target Date 10/23/19      PT LONG TERM GOAL #5   Title Pt. will be able to go 2-3 hours between emptying her bladder without increased pain and empty her bladder fully whether by urostomy, cath, or voluntary release.    Baseline Pt. has a urostomy but continues to have to self-catheterize. Has pain with bladder filling and when using catheter. As of 4/29: is doing better since starting to take the pregabalin andd baclofen but occasionally still  has pain with bladder filling. It is bad when she has an infection or over-does activity, better on "normal" days which have been few and far between due to frequent UTI's. As of 6/10: Pt. has "good" and "bad" days but has more days where she can go 2-3 hours without increased pain    Time 10    Period Weeks    Status On-going    Target Date 05/04/20      PT LONG TERM GOAL #6   Title Patient will report having BM's at least every-other day with consistency between [facility] stool scale 3-5 over the prior week to demonstrate decreased constipation.    Baseline Pt. unable to have regula BM's without medication, manometry shows PFM dysfunction. As of 4/29: still having to use and enema or supository occasionally and still needing linzess. As of 6/10: Pt. has days where she can empty ok and days where she needs to use an Enema to stimulate a BM, feels that it is just the insertion of the enema, not the solution that does the job. As of 11/9: Pt. not having constipation due to colonic infection that has shifted her toward diarrhea. Having BM's daily, sometimes type 5.    Time 10    Period Weeks  Status On-going    Target Date 05/04/20                 Plan - 03/18/20 0756    Clinical Impression Statement Pt. Responded well to all interventions today, demonstrating decreased spasm and pain as well as understanding and correct performance of all education and exercises provided today. They will continue to benefit from skilled physical therapy to work toward remaining goals and maximize function as well as decrease likelihood of symptom increase or recurrence.    PT Next Visit Plan Ask about taping/re-apply, further suprapubic scar work, deep-core, scapular tracking/strength, and balance, MFR to lower abdomen, Review pain diary, cupping to medial thighs and upper back, re-assess neural glides on LLE, focus on graded activity, and seated and standing balance/core strength. review I's Y's and T's in  prone, thoracic mobility? perform further release and PNF for R>L shoulder blades, more scar-release and cupping at suprapubic scar, MFR around B ilium. continue to work on thoracolumbar rotation ROM and decreasing spasms, add SLS balance training, .    PT Home Exercise Plan A day: low back stretch, side-stretch, hamstring stretch, butterfly stretch, hip EXT in prone with knee bent, side-lying hip ABD, calf raises. B day: Chest-stretch, Thoracic extensions over a towel roll, seated rows and seated scapular retraction (pull shoulder blades together like "T's") with band, child's pose stretch, self internal TP release (especially when Pelvic floor spasms high), Every day: Pain diary, Posterior pelvic tilts with deep core activation and red band for obliques activation, bow-and-arrow, TENS unit 3+ times per day for 30 min and as needed if pain starts to increase. TENS at ASIS/PSIS at strong tingle for 1 hr. per day. Sleeper stretch PRN, vagal nerve toning    Consulted and Agree with Plan of Care Patient           Patient will benefit from skilled therapeutic intervention in order to improve the following deficits and impairments:     Visit Diagnosis: Other muscle spasm  Muscle weakness (generalized)  Difficulty in walking, not elsewhere classified     Problem List Patient Active Problem List   Diagnosis Date Noted  . Seizure ([place]) 11/11/2018  . Major depressive disorder, recurrent episode, moderate ([place]) 02/07/2018  . Nephrolithiasis 04/16/2016  . Numbness 07/28/2015  . Bladder retention 06/23/2015  . Abdominal pain 06/04/2015  . Dizziness 05/18/2015  . Neck pain 05/18/2015  . Complicated migraine 80/06/4915  . Other fatigue 04/28/2015  . Abnormal finding on MRI of brain 04/28/2015  . D (diarrhea) 03/29/2015  . H/O disease 03/29/2015  . Abnormal weight loss 03/29/2015  . Muscle weakness (generalized) 03/14/2015  . Headache, migraine 03/10/2015   Willa Rough DPT, ATC] Willa Rough 03/18/2020, 8:00 AM  [place] MAIN [facility] SERVICES [address] [place], [place], [address] Phone: [phone]   Fax:  [phone]  Name: Christine Chavez MRN: [id number] Date of Birth: 1981-02-04

## 2020-03-16 NOTE — Patient Instructions (Signed)
Vagus nerve toning:   Lie in a reclined position with knees bent, feet flat. Place one hand on stomach, other on chest. Breathe deeply through nose, lifting belly hand without any motion of hand on chest, as you exhale make an "Mmmmmm" sound to help down regulate your nervous system and allow for less nausea, pain, anxiety, constipation and poor appetite. Repeat for 3-5 min. Per night.   Try using TENS unit to help decrease menstrual cramp pain.

## 2020-03-18 ENCOUNTER — Other Ambulatory Visit: Payer: Self-pay

## 2020-03-18 ENCOUNTER — Ambulatory Visit: Payer: BC Managed Care – PPO | Attending: Gastroenterology

## 2020-03-18 DIAGNOSIS — M62838 Other muscle spasm: Secondary | ICD-10-CM | POA: Diagnosis present

## 2020-03-18 DIAGNOSIS — M6281 Muscle weakness (generalized): Secondary | ICD-10-CM | POA: Diagnosis present

## 2020-03-18 DIAGNOSIS — R262 Difficulty in walking, not elsewhere classified: Secondary | ICD-10-CM | POA: Insufficient documentation

## 2020-03-18 NOTE — Therapy (Signed)
[facility] [address] [place], [place], [address] Phone: [phone]   Fax:  [phone]  Physical Therapy Treatment  The patient has been informed of current processes in place at Outpatient Rehab to protect patients from Covid-19 exposure including social distancing, schedule modifications, and new cleaning procedures. After discussing their particular risk with a therapist based on the patient's personal risk factors, the patient has decided to proceed with in-person therapy.  Patient Details  Name: Christine Chavez MRN: [id number] Date of Birth: 10/11/1980 No data recorded  Encounter Date: 03/18/2020   PT End of Session - 03/18/20 1240    Visit Number 74    Number of Visits 78    Date for PT Re-Evaluation 05/04/20    Authorization Type [employer]    Authorization Time Period from 02/24/20 through 05/04/20    Authorization - Visit Number 26    Authorization - Number of Visits 30    Progress Note Due on Visit 78    PT Start Time 0930    PT Stop Time 1030    PT Time Calculation (min) 60 min    Activity Tolerance Patient tolerated treatment well;No increased pain    Behavior During Therapy WFL for tasks assessed/performed           Past Medical History:  Diagnosis Date  . Complication of anesthesia    ? seizures after anesthesia   . Headache   . Migraines   . Neurogenic bladder   . Renal disorder   . Vision abnormalities     Past Surgical History:  Procedure Laterality Date  . ANTERIOR CRUCIATE LIGAMENT REPAIR  1997  . APPENDECTOMY    . COLONOSCOPY WITH PROPOFOL N/A 11/11/2018   Procedure: COLONOSCOPY WITH PROPOFOL;  Surgeon: Skulskie, Martin U, MD;  Location: [facility];  Service: Endoscopy;  Laterality: N/A;  . CYSTOSCOPY WITH STENT PLACEMENT Right 04/17/2016   Procedure: CYSTOSCOPY WITH STENT PLACEMENT;  Surgeon: Patrick L McKenzie, MD;  Location: [facility] ORS;  Service: Urology;  Laterality: Right;  .  ESOPHAGOGASTRODUODENOSCOPY (EGD) WITH PROPOFOL N/A 11/11/2018   Procedure: ESOPHAGOGASTRODUODENOSCOPY (EGD) WITH PROPOFOL;  Surgeon: Skulskie, Martin U, MD;  Location: [facility] ENDOSCOPY;  Service: Endoscopy;  Laterality: N/A;  . EXPLORATORY LAPAROTOMY  1999  . KIDNEY STONE SURGERY  04/2016  . REVISION UROSTOMY CUTANEOUS    . REVISION UROSTOMY CUTANEOUS  01/10/2018  . SUPRAPUBIC CATHETER PLACEMENT  08/2017  . TONSILLECTOMY      There were no vitals filed for this visit.  Pelvic Floor Physical Therapy Treatment Note  SCREENING  Changes in medications, allergies, or medical history?: none  SUBJECTIVE  Patient reports: She started feeling really bad Tuesday evening and was up and down "a good portion of the night" with diarrhea and started having flank pain. Also had a little increase white sloughing and some smell in her urine. Had the TENS unit on the majority of the afternoon (~ 3 hours) yesterday and had heat for ~ 1 hr. Because of high pain. Hoping that c-diff is not trying to ramp back up or something. Has not had much of an appetite. Had just a little of the bladder pain when emptying but not bad. Feeling a little nauseated today and some pain but not as bad as last night. Right side seems to be worse than left (opposite from what was worse on Tuesday). Didn't throw up or feel like it was a stomach bug. Had heavy bleeding from period on Tuesday   and less yesterday and today.   Precautions:  Neurological diagnosis pending, has "non-specific" brain lesions   Pain update: (numbers not updated due to Pt. Difficulty describing sensation) Location of pain: LB and neck/shoulders (bladder) Current pain:  5/10) (2/10) Max pain: 8/10 (5/10 when it spasms) Least pain:  2/10 (2/10) Nature of pain: sharp to dull ache (grabbing)  **Pain 4/10 following treatment.  Patient Goals: Get rid of the discomfort with catheterizing and ache/bladder pain. Be able to take fewer medications and be able to  enjoy spending time with her family again, doing "all the mom things"    OBJECTIVE  Changes in:  Observations/Posture:  L lumbar curve prominent with transverse processes more prominent on the L. (from previous note)  C3 spinous process deviated R from tension through R cervical extensors. (from prior note)  Today: in a staxi rather than her chair which forces worse forward-rolled posture.  Range of Motion/Flexibilty:  (from 03/27/19) Spine: ~ 2-3 fingers from knee with SB B. ~ 50% reduced R rotation with stretch in L, ~ 75% reduced L rotation with increased tightness. HS: able to achieve 90 deg. Hip flexion in seated HS stretch. (from prior treatment)  Decreased PIVM through Thoracic, lumbar, and sacral spine with greatest restriction at T3-6. (from prior session)  Strength/MMT:  LE MMT (from 03-27-19) LE MMT Left Right  Hip flex:  (L2) /5 /5  Hip ext: 3+/5 3+/5  Hip abd: 4/5 4/5  Hip add: 4/5 3+/5  Hip IR 4/5 3+/5  Hip ER 4/5 4/5   (From 3/24): Pt. Demonstrates 3/5 strength with "I's, Y's, and T's" but 4/5 with W's. (from 6/22) DF: 9 reps with B. Eversion/Inversion: 4/5 on R, 4+/5 on L. DF: L 4+/5, R 3+/5  Seated IR/ER on L: 4/5, R 3+/5 (from 8-5) L DF and Eversion 4+/5 with first 1-3 reps and fatigue decreased to 4/4. (9/9) Pt. Demonstrates ~ 4+/5 B with increased spasticity B.   Balance/stability:  (6-29) Pt. Able to achieve good posture without cueing but only able to maintain for ~ 2.5 min. Before seeking stability from the table and forward rolled shoulders. With cue to engage the deep core, Pt. Recognizes increased stability and comfort.  8/5: Pt. Demonstrating decreased stability in gait and standing balance today with fasciculations increasing in direct relation to muscular endurance required. Able to stand ~ 10 sec. In SLS with MIN UE support before intensity of spasms make attempt at balance futile.    Pelvic Floor External Exam: (From prior  session)- [Introitus Appears: elevated Skin integrity: WNL Palpation: TTP to B STP Cough: limited motion Prolapse visible?: no Scar mobility: not assessed  Internal Vaginal Exam: Strength (PERF): Pt. Has difficulty sensing the PFM to generate contraction or relaxation due to high tone/neuro involvement Symmetry: L>R for spasm/TTP Palpation: TTP to all muscles throughout B Prolapse: none]  unable to coordinate squeeze and TTP to all areas with the greatest at B OI and coccygeus. ** following TP release Pt. Able to achieve 1/5 PFM contraction. Decreased TTP at B anterior PR/PC following treatment. (10/14).  TODAY: not re-assessed today.  Abdominal:  Pt. Demonstrated ability to recruit R>L obliques and TA with pelvic tilts. (from prior session)  Palpation: Decreased fascial and scar mobility through lower abdomen with radiating Sx. To the pelvis and LB. (from prior session)  TODAY: TTP to R erector spinae at ~ T12-L2  Gait Analysis:  INTERVENTIONS THIS SESSION:  Manual: Performed TP release and STM at  R erector spinae   at ~ T12-L2 to decrease spasm and pain and allow for improved balance of musculature for improved function and decreased symptoms.  Dry-needle: Performed TPDN with a .30x38m needle and standard approach as described below to decrease spasm and pain and allow for improved balance of musculature for improved function and decreased symptoms.  Self-care: discussed increased Sx and potential that a change in pressure at T/L junction could have contributed to bowel changes. Re-iterated that vagus nerve changes can effect GI, nausea and pain and encouraged to continue to perform her humming and singing exercises.    Ultrasound: Performed pulsed ultrasound at a 20% duty cycle, intensity of 2 and frequency of 1 to calm the T/L junction following TPDN and decrease inflammation as well as encourage kidney health due to Pt. Concern that infection could be trying to return.    Total time: 60 min.                       Trigger Point Dry Needling - 03/18/20 0001    Consent Given? Yes    Education Handout Provided No    Muscles Treated Back/Hip Erector spinae    Dry Needling Comments right near T/L junction    Erector spinae Response Twitch response elicited;Palpable increased muscle length                  PT Short Term Goals - 02/24/20 1142      PT SHORT TERM GOAL #1   Title Patient will demonstrate coordinated diaphragmatic breathing with pelvic tilts to demonstrate improved control of diaphragm and TA, to allow for further strengthening of core musculature and decreased pelvic floor spasm.    Baseline Pt. demonstrates breathing dysfunction and poor PFM coordination evidenced by anal manometry As of 2/22: Pt. continues to have restriction in her diaphragm and near T/L junction but is able to intentionally use diaphragmatic breathing through the available ROM.    Time 5    Period Weeks    Status Achieved    Target Date 04/20/19      PT SHORT TERM GOAL #2   Title Patient will report a reduction in pain to no greater than 6/10 over the prior week to demonstrate symptom improvement.    Baseline Pain is 10/10 at worst, 2/10 at best As of 2/22: Pt. had achieved this goal for 1 week as of 1/21 but may have infection or other insult that caused pain to increase again. As of 4/29: Pain high of 8/10 but has been over-doing her activity over the past week. As of 6/10: Pt. was able to keep pain at 6 or below since prior visit by using TENS to help keep tension lower but has not had a full week to test if it will keep working. As of 7/6: Pt. had met this goal but has a new UTI and it caused pain to spike to 8/10 over the weekend. As of 11/9: Pt. has just finally made progress with long-standing UTI and new POC for treatment so she is still having pain spikes up to 8/10 but we expect greater improvement over the next few months.    Time 5     Period Weeks    Status On-going    Target Date 03/30/20      PT SHORT TERM GOAL #3   Title Patient will demonstrate HEP x1 in the clinic to demonstrate understanding and proper form to allow for further improvement.    Baseline Pt. lacks  knowledge of therepeutic exercises that can decrease her pain/Sx.    Time 5    Period Weeks    Status Achieved    Target Date 04/20/19      PT SHORT TERM GOAL #4   Title Patient will report consistent use of foot-stool (squatty-potty) for positioning with BM to decrease pain with BM and intra-abdominal pressure.    Baseline Pt. having constipation due to PFM dysfunction    Time 5    Period Weeks    Status Achieved    Target Date 04/20/19             PT Long Term Goals - 02/24/20 0001      PT LONG TERM GOAL #1   Title Pt. will be able to participate in regular ADL's with least restrictive device without pain increasing greater than 2/10    Baseline Pt. limited in her ability to perform household duties by increased pain and fatigue. As of 4/29: Her legs have more endurance with walking before she gets a tremor. Still needs to sit down the majority of the time she is cooking. Still needs back support for prolonged sitting. As of 6/10: Pt. able to do for  ~1 hour before she needs to rest to prevent increased pain, pain is averaging ~ 4-5/10    Time 10    Period Weeks    Status On-going    Target Date 05/04/20      PT LONG TERM GOAL #2   Title Patient will score at or below 65/300  on the PFDI and 35% on the Female NIH-CPSI to demonstrate a clinically meaningful decrease in disability and distress due to pelvic floor dysfunction.    Baseline PFDI: 110/300, Female NIH-CPSI: 29/43 (67%) As of 4/29: 100/300 on PFDI and 30/43 on NIH-CPSI As of 7/6:  NIH-CPSI: 32/43 POPDI: 61/100, CRADI-8: 29/100, UDI: 44/100 (PFDI: 149/300)    Time 10    Period Weeks    Status On-going    Target Date 05/04/20      PT LONG TERM GOAL #3   Title Patient will report  no pain with intercourse to demonstrate improved functional ability.    Baseline Pt. Having significant pain with intercourse. As of 4/29: is a little better (20-30%) but is worse on days where overall tension/pain is worse. As of 6/10: can have days where there is not much pain but other days where there is significant pain. As of 11/9: same as previous update.    Time 10    Period Weeks    Status On-going    Target Date 05/04/20      PT LONG TERM GOAL #4   Title Pt will report ability to work in her yard for greater than 30 minutes without extreme fatigue    Baseline limited to 10-15 minutes before pt requires ending activity. As of 4/29: ~ 20 min. As of 6/10: has not been gardening much but can do ~ 30 min of similar indoor activity level before increased fatigue.    Time 10    Period Weeks    Status Achieved    Target Date 10/23/19      PT LONG TERM GOAL #5   Title Pt. will be able to go 2-3 hours between emptying her bladder without increased pain and empty her bladder fully whether by urostomy, cath, or voluntary release.    Baseline Pt. has a urostomy but continues to have to self-catheterize. Has pain with bladder filling and when  using catheter. As of 4/29: is doing better since starting to take the pregabalin andd baclofen but occasionally still has pain with bladder filling. It is bad when she has an infection or over-does activity, better on "normal" days which have been few and far between due to frequent UTI's. As of 6/10: Pt. has "good" and "bad" days but has more days where she can go 2-3 hours without increased pain    Time 10    Period Weeks    Status On-going    Target Date 05/04/20      PT LONG TERM GOAL #6   Title Patient will report having BM's at least every-other day with consistency between Bristol stool scale 3-5 over the prior week to demonstrate decreased constipation.    Baseline Pt. unable to have regula BM's without medication, manometry shows PFM dysfunction.  As of 4/29: still having to use and enema or supository occasionally and still needing linzess. As of 6/10: Pt. has days where she can empty ok and days where she needs to use an Enema to stimulate a BM, feels that it is just the insertion of the enema, not the solution that does the job. As of 11/9: Pt. not having constipation due to colonic infection that has shifted her toward diarrhea. Having BM's daily, sometimes type 5.    Time 10    Period Weeks    Status On-going    Target Date 05/04/20                 Plan - 03/18/20 1240    Clinical Impression Statement Pt. Responded well to all interventions today, demonstrating decreased spasm and pain as well as understanding and correct performance of all education and exercises provided today. They will continue to benefit from skilled physical therapy to work toward remaining goals and maximize function as well as decrease likelihood of symptom increase or recurrence.    PT Next Visit Plan Ask about taping/re-apply, further suprapubic scar work, deep-core, scapular tracking/strength, and balance, MFR to lower abdomen, Review pain diary, cupping to medial thighs and upper back, re-assess neural glides on LLE, focus on graded activity, and seated and standing balance/core strength. review I's Y's and T's in prone, thoracic mobility? perform further release and PNF for R>L shoulder blades, more scar-release and cupping at suprapubic scar, MFR around B ilium. continue to work on thoracolumbar rotation ROM and decreasing spasms, add SLS balance training, .    PT Home Exercise Plan A day: low back stretch, side-stretch, hamstring stretch, butterfly stretch, hip EXT in prone with knee bent, side-lying hip ABD, calf raises. B day: Chest-stretch, Thoracic extensions over a towel roll, seated rows and seated scapular retraction (pull shoulder blades together like "T's") with band, child's pose stretch, self internal TP release (especially when Pelvic floor  spasms high), Every day: Pain diary, Posterior pelvic tilts with deep core activation and red band for obliques activation, bow-and-arrow, TENS unit 3+ times per day for 30 min and as needed if pain starts to increase. TENS at ASIS/PSIS at strong tingle for 1 hr. per day. Sleeper stretch PRN, vagal nerve toning    Consulted and Agree with Plan of Care Patient           Patient will benefit from skilled therapeutic intervention in order to improve the following deficits and impairments:     Visit Diagnosis: Other muscle spasm  Muscle weakness (generalized)  Difficulty in walking, not elsewhere classified     Problem List   Patient Active Problem List   Diagnosis Date Noted  . Seizure (HCC) 11/11/2018  . Major depressive disorder, recurrent episode, moderate (HCC) 02/07/2018  . Nephrolithiasis 04/16/2016  . Numbness 07/28/2015  . Bladder retention 06/23/2015  . Abdominal pain 06/04/2015  . Dizziness 05/18/2015  . Neck pain 05/18/2015  . Complicated migraine 04/28/2015  . Other fatigue 04/28/2015  . Abnormal finding on MRI of brain 04/28/2015  . D (diarrhea) 03/29/2015  . H/O disease 03/29/2015  . Abnormal weight loss 03/29/2015  . Muscle weakness (generalized) 03/14/2015  . Headache, migraine 03/10/2015    T.  DPT, ATC  T  03/18/2020, 1:15 PM  [facility] [address] [place], [place], [address] Phone: [phone]   Fax:  [phone]  Name: Christine Chavez MRN: [id number] Date of Birth: 12/01/1980   

## 2020-03-23 ENCOUNTER — Ambulatory Visit: Payer: BC Managed Care – PPO

## 2020-03-23 ENCOUNTER — Other Ambulatory Visit: Payer: Self-pay

## 2020-03-23 DIAGNOSIS — M62838 Other muscle spasm: Secondary | ICD-10-CM | POA: Diagnosis not present

## 2020-03-23 DIAGNOSIS — R262 Difficulty in walking, not elsewhere classified: Secondary | ICD-10-CM

## 2020-03-23 DIAGNOSIS — M6281 Muscle weakness (generalized): Secondary | ICD-10-CM

## 2020-03-23 NOTE — Therapy (Signed)
[place] MAIN [facility] SERVICES [address] [place], [place], [address] Phone: [phone]   Fax:  [phone]  Physical Therapy Treatment  The patient has been informed of current processes in place at Outpatient Rehab to protect patients from Covid-19 exposure including social distancing, schedule modifications, and new cleaning procedures. After discussing their particular risk with a therapist based on the patient's personal risk factors, the patient has decided to proceed with in-person therapy.   Patient Details  Name: Christine Chavez MRN: [id number] Date of Birth: 03-13-1981 No data recorded  Encounter Date: 03/23/2020   PT End of Session - 03/25/20 0739    Visit Number 30    Number of Visits 34    Date for PT Re-Evaluation 05/04/20    Authorization Type [employer]    Authorization Time Period from 02/24/20 through 05/04/20    Authorization - Visit Number 26    Authorization - Number of Visits 30    Progress Note Due on Visit 32    PT Start Time 1030    PT Stop Time 1130    PT Time Calculation (min) 60 min    Activity Tolerance Patient tolerated treatment well;No increased pain    Behavior During Therapy WFL for tasks assessed/performed           Past Medical History:  Diagnosis Date  . Complication of anesthesia    ? seizures after anesthesia   . Headache   . Migraines   . Neurogenic bladder   . Renal disorder   . Vision abnormalities     Past Surgical History:  Procedure Laterality Date  . ANTERIOR CRUCIATE LIGAMENT REPAIR  1997  . APPENDECTOMY    . COLONOSCOPY WITH PROPOFOL N/A 11/11/2018   Procedure: COLONOSCOPY WITH PROPOFOL;  Surgeon: [name], MD;  Location: [facility] ENDOSCOPY;  Service: Endoscopy;  Laterality: N/A;  . CYSTOSCOPY WITH STENT PLACEMENT Right 04/17/2016   Procedure: CYSTOSCOPY WITH STENT PLACEMENT;  Surgeon: [name], MD;  Location: [facility] ORS;  Service: Urology;  Laterality: Right;  .  ESOPHAGOGASTRODUODENOSCOPY (EGD) WITH PROPOFOL N/A 11/11/2018   Procedure: ESOPHAGOGASTRODUODENOSCOPY (EGD) WITH PROPOFOL;  Surgeon: [name], MD;  Location: [facility] ENDOSCOPY;  Service: Endoscopy;  Laterality: N/A;  . EXPLORATORY LAPAROTOMY  1999  . KIDNEY STONE SURGERY  04/2016  . REVISION UROSTOMY CUTANEOUS    . REVISION UROSTOMY CUTANEOUS  01/10/2018  . SUPRAPUBIC CATHETER PLACEMENT  08/2017  . TONSILLECTOMY      There were no vitals filed for this visit.   Pelvic Floor Physical Therapy Treatment Note  SCREENING  Changes in medications, allergies, or medical history?: none  SUBJECTIVE  Patient reports: Doing "so-so" has had a lot going on so she has overdone it a little but has been trying to balance her rest. Having an "off" feeling still but no major signs that the UTI is back. Knows she has to make time for intentional rest and has not been good about it. Her back seemed better Thursday and Friday but was increased on Saturday when she also did not feel herself and has stayed more irritated. Her [place] made her feel loved and special though she had to push herself a little bit. Feeling some "pressure" this morning in the bladder but it has been manageable still. Only took one oxybutynin.    Precautions:  Neurological diagnosis pending, has "non-specific" brain lesions   Pain update: (numbers not updated due  to Pt. Difficulty describing sensation) Location of pain: LB and neck/shoulders (bladder) Current pain:  5/10) (2/10) Max pain: 7/10 (5/10 when it spasms) Least pain:  2/10 (2/10) Nature of pain: sharp to dull ache (grabbing)  **Pain 4/10 following treatment.  Patient Goals: Get rid of the discomfort with catheterizing and ache/bladder pain. Be able to take fewer medications and be able to enjoy spending time with her family again, doing "all the mom things"    OBJECTIVE  Changes in:  Observations/Posture:  L  lumbar curve prominent with transverse processes more prominent on the L. (from previous note)  C3 spinous process deviated R from tension through R cervical extensors. (from prior note)   Range of Motion/Flexibilty:  (from 03/27/19) Spine: ~ 2-3 fingers from knee with SB B. ~ 50% reduced R rotation with stretch in L, ~ 75% reduced L rotation with increased tightness. HS: able to achieve 90 deg. Hip flexion in seated HS stretch. (from prior treatment)  Decreased PIVM through Thoracic, lumbar, and sacral spine with greatest restriction at T3-6. (from prior session)  Strength/MMT:  LE MMT (from 03-27-19) LE MMT Left Right  Hip flex:  (L2) /5 /5  Hip ext: 3+/5 3+/5  Hip abd: 4/5 4/5  Hip add: 4/5 3+/5  Hip IR 4/5 3+/5  Hip ER 4/5 4/5   (From 3/24): Pt. Demonstrates 3/5 strength with "I's, Y's, and T's" but 4/5 with W's. (from 6/22) DF: 9 reps with B. Eversion/Inversion: 4/5 on R, 4+/5 on L. DF: L 4+/5, R 3+/5  Seated IR/ER on L: 4/5, R 3+/5 (from 8-5) L DF and Eversion 4+/5 with first 1-3 reps and fatigue decreased to 4/4. (9/9) Pt. Demonstrates ~ 4+/5 B with increased spasticity B.   Balance/stability:  (6-29) Pt. Able to achieve good posture without cueing but only able to maintain for ~ 2.5 min. Before seeking stability from the table and forward rolled shoulders. With cue to engage the deep core, Pt. Recognizes increased stability and comfort.  8/5: Pt. Demonstrating decreased stability in gait and standing balance today with fasciculations increasing in direct relation to muscular endurance required. Able to stand ~ 10 sec. In SLS with MIN UE support before intensity of spasms make attempt at balance futile.    Pelvic Floor External Exam: (From prior session)- [Introitus Appears: elevated Skin integrity: WNL Palpation: TTP to B STP Cough: limited motion Prolapse visible?: no Scar mobility: not assessed  Internal Vaginal Exam: Strength (PERF): Pt. Has difficulty sensing  the PFM to generate contraction or relaxation due to high tone/neuro involvement Symmetry: L>R for spasm/TTP Palpation: TTP to all muscles throughout B Prolapse: none]  unable to coordinate squeeze and TTP to all areas with the greatest at B OI and coccygeus. ** following TP release Pt. Able to achieve 1/5 PFM contraction. Decreased TTP at B anterior PR/PC following treatment. (10/14).  TODAY: not re-assessed today.  Abdominal:  Pt. Demonstrated ability to recruit R>L obliques and TA with pelvic tilts. (from prior session)  Palpation: Decreased fascial and scar mobility through lower abdomen with radiating Sx. To the pelvis and LB. (from prior session)  TODAY: decreased scar mobility at suprapubic region with concordant Sx. Elicited with TPDN.  Gait Analysis:  INTERVENTIONS THIS SESSION:  Manual: Performed TP release to B iliopsoas distally and MFR along suprapubic scar to decrease spasm and pain and allow for improved balance of musculature for improved function and decreased symptoms.  Dry-needle: Performed TPDN with 4 .30x88m needles and superficial depth as described  below to allow for re-orientation of collagen fibers and decreased scar restriction and pain and allow for improved balance of musculature for improved function and decreased symptoms.  Self-care: discussed chronic pain responses and how to modify activity from her pain diary as well as asking her medical providers if she is a candidate for interstim.  Total time: 60 min.                      Trigger Point Dry Needling - 03/25/20 0001    Consent Given? Yes    Education Handout Provided No    Dry Needling Comments supra-pubic scar horizontally at 4 points    Other Dry Needling concordant Sx. elicited                  PT Short Term Goals - 02/24/20 1142      PT SHORT TERM GOAL #1   Title Patient will demonstrate coordinated diaphragmatic breathing with pelvic tilts to demonstrate  improved control of diaphragm and TA, to allow for further strengthening of core musculature and decreased pelvic floor spasm.    Baseline Pt. demonstrates breathing dysfunction and poor PFM coordination evidenced by anal manometry As of 2/22: Pt. continues to have restriction in her diaphragm and near T/L junction but is able to intentionally use diaphragmatic breathing through the available ROM.    Time 5    Period Weeks    Status Achieved    Target Date 04/20/19      PT SHORT TERM GOAL #2   Title Patient will report a reduction in pain to no greater than 6/10 over the prior week to demonstrate symptom improvement.    Baseline Pain is 10/10 at worst, 2/10 at best As of 2/22: Pt. had achieved this goal for 1 week as of 1/21 but may have infection or other insult that caused pain to increase again. As of 4/29: Pain high of 8/10 but has been over-doing her activity over the past week. As of 6/10: Pt. was able to keep pain at 6 or below since prior visit by using TENS to help keep tension lower but has not had a full week to test if it will keep working. As of 7/6: Pt. had met this goal but has a new UTI and it caused pain to spike to 8/10 over the weekend. As of 11/9: Pt. has just finally made progress with long-standing UTI and new POC for treatment so she is still having pain spikes up to 8/10 but we expect greater improvement over the next few months.    Time 5    Period Weeks    Status On-going    Target Date 03/30/20      PT SHORT TERM GOAL #3   Title Patient will demonstrate HEP x1 in the clinic to demonstrate understanding and proper form to allow for further improvement.    Baseline Pt. lacks knowledge of therepeutic exercises that can decrease her pain/Sx.    Time 5    Period Weeks    Status Achieved    Target Date 04/20/19      PT SHORT TERM GOAL #4   Title Patient will report consistent use of foot-stool (squatty-potty) for positioning with BM to decrease pain with BM and  intra-abdominal pressure.    Baseline Pt. having constipation due to PFM dysfunction    Time 5    Period Weeks    Status Achieved    Target Date 04/20/19  PT Long Term Goals - 02/24/20 0001      PT LONG TERM GOAL #1   Title Pt. will be able to participate in regular ADL's with least restrictive device without pain increasing greater than 2/10    Baseline Pt. limited in her ability to perform household duties by increased pain and fatigue. As of 4/29: Her legs have more endurance with walking before she gets a tremor. Still needs to sit down the majority of the time she is cooking. Still needs back support for prolonged sitting. As of 6/10: Pt. able to do for  ~1 hour before she needs to rest to prevent increased pain, pain is averaging ~ 4-5/10    Time 10    Period Weeks    Status On-going    Target Date 05/04/20      PT LONG TERM GOAL #2   Title Patient will score at or below 65/300  on the PFDI and 35% on the Female NIH-CPSI to demonstrate a clinically meaningful decrease in disability and distress due to pelvic floor dysfunction.    Baseline PFDI: 110/300, Female NIH-CPSI: 29/43 (67%) As of 4/29: 100/300 on PFDI and 30/43 on NIH-CPSI As of 7/6:  NIH-CPSI: 32/43 POPDI: 61/100, CRADI-8: 29/100, UDI: 44/100 (PFDI: 149/300)    Time 10    Period Weeks    Status On-going    Target Date 05/04/20      PT LONG TERM GOAL #3   Title Patient will report no pain with intercourse to demonstrate improved functional ability.    Baseline Pt. Having significant pain with intercourse. As of 4/29: is a little better (20-30%) but is worse on days where overall tension/pain is worse. As of 6/10: can have days where there is not much pain but other days where there is significant pain. As of 11/9: same as previous update.    Time 10    Period Weeks    Status On-going    Target Date 05/04/20      PT LONG TERM GOAL #4   Title Pt will report ability to work in her yard for greater than 30  minutes without extreme fatigue    Baseline limited to 10-15 minutes before pt requires ending activity. As of 4/29: ~ 20 min. As of 6/10: has not been gardening much but can do ~ 30 min of similar indoor activity level before increased fatigue.    Time 10    Period Weeks    Status Achieved    Target Date 10/23/19      PT LONG TERM GOAL #5   Title Pt. will be able to go 2-3 hours between emptying her bladder without increased pain and empty her bladder fully whether by urostomy, cath, or voluntary release.    Baseline Pt. has a urostomy but continues to have to self-catheterize. Has pain with bladder filling and when using catheter. As of 4/29: is doing better since starting to take the pregabalin andd baclofen but occasionally still has pain with bladder filling. It is bad when she has an infection or over-does activity, better on "normal" days which have been few and far between due to frequent UTI's. As of 6/10: Pt. has "good" and "bad" days but has more days where she can go 2-3 hours without increased pain    Time 10    Period Weeks    Status On-going    Target Date 05/04/20      PT LONG TERM GOAL #6   Title Patient  will report having BM's at least every-other day with consistency between [facility] stool scale 3-5 over the prior week to demonstrate decreased constipation.    Baseline Pt. unable to have regula BM's without medication, manometry shows PFM dysfunction. As of 4/29: still having to use and enema or supository occasionally and still needing linzess. As of 6/10: Pt. has days where she can empty ok and days where she needs to use an Enema to stimulate a BM, feels that it is just the insertion of the enema, not the solution that does the job. As of 11/9: Pt. not having constipation due to colonic infection that has shifted her toward diarrhea. Having BM's daily, sometimes type 5.    Time 10    Period Weeks    Status On-going    Target Date 05/04/20                 Plan -  03/25/20 0742    Clinical Impression Statement Pt. Responded well to all interventions today, demonstrating increased scar mobility, decreased pain and spasm, as well as understanding and correct performance of all education and exercises provided today. They will continue to benefit from skilled physical therapy to work toward remaining goals and maximize function as well as decrease likelihood of symptom increase or recurrence.    PT Next Visit Plan Ask about taping/re-apply, further suprapubic scar work, deep-core, scapular tracking/strength, and balance, MFR to lower abdomen, Review pain diary, cupping to medial thighs and upper back, re-assess neural glides on LLE, focus on graded activity, and seated and standing balance/core strength. review I's Y's and T's in prone, thoracic mobility? perform further release and PNF for R>L shoulder blades, more scar-release and cupping at suprapubic scar, MFR around B ilium. continue to work on thoracolumbar rotation ROM and decreasing spasms, add SLS balance training, .    PT Home Exercise Plan A day: low back stretch, side-stretch, hamstring stretch, butterfly stretch, hip EXT in prone with knee bent, side-lying hip ABD, calf raises. B day: Chest-stretch, Thoracic extensions over a towel roll, seated rows and seated scapular retraction (pull shoulder blades together like "T's") with band, child's pose stretch, self internal TP release (especially when Pelvic floor spasms high), Every day: Pain diary, Posterior pelvic tilts with deep core activation and red band for obliques activation, bow-and-arrow, TENS unit 3+ times per day for 30 min and as needed if pain starts to increase. TENS at ASIS/PSIS at strong tingle for 1 hr. per day. Sleeper stretch PRN, vagal nerve toning    Consulted and Agree with Plan of Care Patient           Patient will benefit from skilled therapeutic intervention in order to improve the following deficits and impairments:     Visit  Diagnosis: Other muscle spasm  Muscle weakness (generalized)  Difficulty in walking, not elsewhere classified     Problem List Patient Active Problem List   Diagnosis Date Noted  . Seizure ([place]) 11/11/2018  . Major depressive disorder, recurrent episode, moderate ([place]) 02/07/2018  . Nephrolithiasis 04/16/2016  . Numbness 07/28/2015  . Bladder retention 06/23/2015  . Abdominal pain 06/04/2015  . Dizziness 05/18/2015  . Neck pain 05/18/2015  . Complicated migraine [id number]/6720  . Other fatigue 04/28/2015  . Abnormal finding on MRI of brain 04/28/2015  . D (diarrhea) 03/29/2015  . H/O disease 03/29/2015  . Abnormal weight loss 03/29/2015  . Muscle weakness (generalized) 03/14/2015  . Headache, migraine 03/10/2015   Willa Rough DPT, ATC  Willa Rough 03/25/2020, 7:47 AM  [place] MAIN [facility] SERVICES [address] [place], [place], [address] Phone: [phone]   Fax:  [phone]  Name: Christine Chavez MRN: [id number] Date of Birth: [birth year]

## 2020-03-23 NOTE — Patient Instructions (Signed)
   Remember to ask whether you are a candidate for interstim.

## 2020-03-25 ENCOUNTER — Ambulatory Visit: Payer: BC Managed Care – PPO

## 2020-03-30 ENCOUNTER — Ambulatory Visit: Payer: BC Managed Care – PPO

## 2020-03-30 ENCOUNTER — Other Ambulatory Visit: Payer: Self-pay

## 2020-03-30 DIAGNOSIS — M6281 Muscle weakness (generalized): Secondary | ICD-10-CM

## 2020-03-30 DIAGNOSIS — R262 Difficulty in walking, not elsewhere classified: Secondary | ICD-10-CM

## 2020-03-30 DIAGNOSIS — M62838 Other muscle spasm: Secondary | ICD-10-CM | POA: Diagnosis not present

## 2020-03-30 NOTE — Therapy (Signed)
[place] [facility] SERVICES [address] [place], [place], [address] Phone: [phone]   Fax:  [phone]  Physical Therapy Treatment   The patient has been informed of current processes in place at Outpatient Rehab to protect patients from Covid-19 exposure including social distancing, schedule modifications, and new cleaning procedures. After discussing their particular risk with a therapist based on the patient's personal risk factors, the patient has decided to proceed with in-person therapy.  Patient Details  Name: Christine Chavez MRN: [id number] Date of Birth: Jun 01, 1980 No data recorded  Encounter Date: 03/30/2020   PT End of Session - 03/30/20 1057    Visit Number 81    Number of Visits 54    Date for PT Re-Evaluation 05/04/20    Authorization Type [employer]    Authorization Time Period from 02/24/20 through 05/04/20    Authorization - Visit Number 28    Authorization - Number of Visits 30    Progress Note Due on Visit 98    PT Start Time 1030    PT Stop Time 1130    PT Time Calculation (min) 60 min    Activity Tolerance Patient tolerated treatment well;No increased pain    Behavior During Therapy WFL for tasks assessed/performed           Past Medical History:  Diagnosis Date  . Complication of anesthesia    ? seizures after anesthesia   . Headache   . Migraines   . Neurogenic bladder   . Renal disorder   . Vision abnormalities     Past Surgical History:  Procedure Laterality Date  . ANTERIOR CRUCIATE LIGAMENT REPAIR  1997  . APPENDECTOMY    . COLONOSCOPY WITH PROPOFOL N/A 11/11/2018   Procedure: COLONOSCOPY WITH PROPOFOL;  Surgeon: [name], MD;  Location: [facility] ENDOSCOPY;  Service: Endoscopy;  Laterality: N/A;  . CYSTOSCOPY WITH STENT PLACEMENT Right 04/17/2016   Procedure: CYSTOSCOPY WITH STENT PLACEMENT;  Surgeon: [name], MD;  Location: [facility] ORS;  Service: Urology;  Laterality: Right;  .  ESOPHAGOGASTRODUODENOSCOPY (EGD) WITH PROPOFOL N/A 11/11/2018   Procedure: ESOPHAGOGASTRODUODENOSCOPY (EGD) WITH PROPOFOL;  Surgeon: [name], MD;  Location: [facility] ENDOSCOPY;  Service: Endoscopy;  Laterality: N/A;  . EXPLORATORY LAPAROTOMY  1999  . KIDNEY STONE SURGERY  04/2016  . REVISION UROSTOMY CUTANEOUS    . REVISION UROSTOMY CUTANEOUS  01/10/2018  . SUPRAPUBIC CATHETER PLACEMENT  08/2017  . TONSILLECTOMY      There were no vitals filed for this visit.   Pelvic Floor Physical Therapy Treatment Note  SCREENING  Changes in medications, allergies, or medical history?: none  SUBJECTIVE  Patient reports:  She is not doing great, has been having more headaches and nausea. She has also been having more flank pain and her urine is stinking again. Her neurologist said that she may be having increased fluid on the brain that is causing the visual changes and headaches. He thinks they may have to do a lumbar puncture to determine if she is having increased fluid/pressure. They are starting a new medication to try to manage headaches. It will take up to 6 weeks for her to know whether it will help. Her LLE is also "a hot mess" right now. If feels cold and is "going to sleep" sometimes. Her Mom's party came together well and she did not have to do any of the "grunt work". Did a vaginal valium before the party on Saturday because bladder spasms were  so bad. Thinks TENS unit helps some.  Precautions:  Neurological diagnosis pending, has "non-specific" brain lesions  Pain update:  (numbers not updated due to Pt. Difficulty describing sensation)  Location of pain: LB and neck/shoulders (bladder)  Current pain: 5/10) (5/10)  Max pain: 8/10 (5/10 when it spasms)  Least pain: 3/10 (2/10)  Nature of pain: sharp to dull ache (grabbing)  **decreased headache pain, increased irritation of lumbar region following dry-needling work.  Patient Goals:  Get rid of the discomfort with catheterizing and  ache/bladder pain. Be able to take fewer medications and be able to enjoy spending time with her family again, doing "all the mom things"  OBJECTIVE  Changes in:  Observations/Posture:  L lumbar curve prominent with transverse processes more prominent on the L. (from previous note)  Today: C3 spinous process deviated R from tension through R cervical extensors.  **improved ~ 50% following treatment.  Range of Motion/Flexibilty:  (from 03/27/19)  Spine: ~ 2-3 fingers from knee with SB B. ~ 50% reduced R rotation with stretch in L, ~ 75% reduced L rotation with increased tightness. HS: able to achieve 90 deg. Hip flexion in seated HS stretch. (from prior treatment)  Decreased PIVM through Thoracic, lumbar, and sacral spine with greatest restriction at T3-6. (from prior session)  Strength/MMT:  LE MMT (from 03-27-19)  LE MMT Left Right  Hip flex: (L2) /5 /5  Hip ext: 3+/5 3+/5  Hip abd: 4/5 4/5  Hip add: 4/5 3+/5  Hip IR 4/5 3+/5  Hip ER 4/5 4/5  (From 3/24): Pt. Demonstrates 3/5 strength with "I's, Y's, and T's" but 4/5 with W's.  (from 6/22)  DF: 9 reps with B. Eversion/Inversion: 4/5 on R, 4+/5 on L. DF: L 4+/5, R 3+/5  Seated IR/ER on L: 4/5, R 3+/5  (from 8-5) L DF and Eversion 4+/5 with first 1-3 reps and fatigue decreased to 4/4.  (9/9) Pt. Demonstrates ~ 4+/5 B with increased spasticity B.  Balance/stability:  (6-29) Pt. Able to achieve good posture without cueing but only able to maintain for ~ 2.5 min. Before seeking stability from the table and forward rolled shoulders. With cue to engage the deep core, Pt. Recognizes increased stability and comfort.  8/5: Pt. Demonstrating decreased stability in gait and standing balance today with fasciculations increasing in direct relation to muscular endurance required. Able to stand ~ 10 sec. In SLS with MIN UE support before intensity of spasms make attempt at balance futile.  Pelvic Floor  External Exam:  (From prior session)-   [Introitus Appears: elevated  Skin integrity: WNL  Palpation: TTP to B STP  Cough: limited motion  Prolapse visible?: no  Scar mobility: not assessed  Internal Vaginal Exam:  Strength (PERF): Pt. Has difficulty sensing the PFM to generate contraction or relaxation due to high tone/neuro involvement  Symmetry: L>R for spasm/TTP  Palpation: TTP to all muscles throughout B  Prolapse: none]  unable to coordinate squeeze and TTP to all areas with the greatest at B OI and coccygeus. ** following TP release Pt. Able to achieve 1/5 PFM contraction. Decreased TTP at B anterior PR/PC following treatment. (10/14).  TODAY: not re-assessed today.  Abdominal:  Pt. Demonstrated ability to recruit R>L obliques and TA with pelvic tilts. (from prior session)  Palpation:  Decreased fascial and scar mobility through lower abdomen with radiating Sx. To the pelvis and LB. (from prior session)  TODAY: TTP at ~T12- L1 and L4-5 multifidus B, and to B sub-occipitals, and  L cervical extensors  Gait Analysis:  INTERVENTIONS THIS SESSION:  Manual: Performed TP release to B lumbar and thoracic multifidus, B sub-occipitals, and L cervical extensors near C3 followed by grade 2-3 R to L lateral mobs at C3 to improve cervical alignment and to decrease spasm and pain and allow for improved balance of musculature for improved function and decreased symptoms.  Dry-needle: Performed TPDN with 3 .30x68mm and 3 .25x62mm needles as described below to to decrease spasm and pain and allow for improved spinal alignment, decreased scar tissue length/collagen alignment of fascia near prior lumbar puncture site, and balance of musculature for improved function and decreased symptoms.  Therex: educated Pt. On how to stretch the L cervical extensors and B sub-occipitals to maintain and improve muscle length and allow for improved balance of musculature for long-term symptom relief.  Total time: 60 min.                         Trigger Point Dry Needling - 03/30/20 0001    Consent Given? Yes    Education Handout Provided No    Muscles Treated Head and Neck Splenius capitus;Semispinalis capitus;Suboccipitals    Muscles Treated Back/Hip Lumbar multifidi;Thoracic multifidi    Dry Needling Comments T/L junction and ~ L4 L cervical extensors, B sub-occipitals.                  PT Short Term Goals - 02/24/20 1142      PT SHORT TERM GOAL #1   Title Patient will demonstrate coordinated diaphragmatic breathing with pelvic tilts to demonstrate improved control of diaphragm and TA, to allow for further strengthening of core musculature and decreased pelvic floor spasm.    Baseline Pt. demonstrates breathing dysfunction and poor PFM coordination evidenced by anal manometry As of 2/22: Pt. continues to have restriction in her diaphragm and near T/L junction but is able to intentionally use diaphragmatic breathing through the available ROM.    Time 5    Period Weeks    Status Achieved    Target Date 04/20/19      PT SHORT TERM GOAL #2   Title Patient will report a reduction in pain to no greater than 6/10 over the prior week to demonstrate symptom improvement.    Baseline Pain is 10/10 at worst, 2/10 at best As of 2/22: Pt. had achieved this goal for 1 week as of 1/21 but may have infection or other insult that caused pain to increase again. As of 4/29: Pain high of 8/10 but has been over-doing her activity over the past week. As of 6/10: Pt. was able to keep pain at 6 or below since prior visit by using TENS to help keep tension lower but has not had a full week to test if it will keep working. As of 7/6: Pt. had met this goal but has a new UTI and it caused pain to spike to 8/10 over the weekend. As of 11/9: Pt. has just finally made progress with long-standing UTI and new POC for treatment so she is still having pain spikes up to 8/10 but we expect greater improvement over the  next few months.    Time 5    Period Weeks    Status On-going    Target Date 03/30/20      PT SHORT TERM GOAL #3   Title Patient will demonstrate HEP x1 in the clinic to demonstrate understanding and proper form to allow for further  improvement.    Baseline Pt. lacks knowledge of therepeutic exercises that can decrease her pain/Sx.    Time 5    Period Weeks    Status Achieved    Target Date 04/20/19      PT SHORT TERM GOAL #4   Title Patient will report consistent use of foot-stool (squatty-potty) for positioning with BM to decrease pain with BM and intra-abdominal pressure.    Baseline Pt. having constipation due to PFM dysfunction    Time 5    Period Weeks    Status Achieved    Target Date 04/20/19             PT Long Term Goals - 02/24/20 0001      PT LONG TERM GOAL #1   Title Pt. will be able to participate in regular ADL's with least restrictive device without pain increasing greater than 2/10    Baseline Pt. limited in her ability to perform household duties by increased pain and fatigue. As of 4/29: Her legs have more endurance with walking before she gets a tremor. Still needs to sit down the majority of the time she is cooking. Still needs back support for prolonged sitting. As of 6/10: Pt. able to do for  ~1 hour before she needs to rest to prevent increased pain, pain is averaging ~ 4-5/10    Time 10    Period Weeks    Status On-going    Target Date 05/04/20      PT LONG TERM GOAL #2   Title Patient will score at or below 65/300  on the PFDI and 35% on the Female NIH-CPSI to demonstrate a clinically meaningful decrease in disability and distress due to pelvic floor dysfunction.    Baseline PFDI: 110/300, Female NIH-CPSI: 29/43 (67%) As of 4/29: 100/300 on PFDI and 30/43 on NIH-CPSI As of 7/6:  NIH-CPSI: 32/43 POPDI: 61/100, CRADI-8: 29/100, UDI: 44/100 (PFDI: 149/300)    Time 10    Period Weeks    Status On-going    Target Date 05/04/20      PT LONG TERM GOAL  #3   Title Patient will report no pain with intercourse to demonstrate improved functional ability.    Baseline Pt. Having significant pain with intercourse. As of 4/29: is a little better (20-30%) but is worse on days where overall tension/pain is worse. As of 6/10: can have days where there is not much pain but other days where there is significant pain. As of 11/9: same as previous update.    Time 10    Period Weeks    Status On-going    Target Date 05/04/20      PT LONG TERM GOAL #4   Title Pt will report ability to work in her yard for greater than 30 minutes without extreme fatigue    Baseline limited to 10-15 minutes before pt requires ending activity. As of 4/29: ~ 20 min. As of 6/10: has not been gardening much but can do ~ 30 min of similar indoor activity level before increased fatigue.    Time 10    Period Weeks    Status Achieved    Target Date 10/23/19      PT LONG TERM GOAL #5   Title Pt. will be able to go 2-3 hours between emptying her bladder without increased pain and empty her bladder fully whether by urostomy, cath, or voluntary release.    Baseline Pt. has a urostomy but continues to have to self-catheterize.  Has pain with bladder filling and when using catheter. As of 4/29: is doing better since starting to take the pregabalin andd baclofen but occasionally still has pain with bladder filling. It is bad when she has an infection or over-does activity, better on "normal" days which have been few and far between due to frequent UTI's. As of 6/10: Pt. has "good" and "bad" days but has more days where she can go 2-3 hours without increased pain    Time 10    Period Weeks    Status On-going    Target Date 05/04/20      PT LONG TERM GOAL #6   Title Patient will report having BM's at least every-other day with consistency between [facility] stool scale 3-5 over the prior week to demonstrate decreased constipation.    Baseline Pt. unable to have regula BM's without medication,  manometry shows PFM dysfunction. As of 4/29: still having to use and enema or supository occasionally and still needing linzess. As of 6/10: Pt. has days where she can empty ok and days where she needs to use an Enema to stimulate a BM, feels that it is just the insertion of the enema, not the solution that does the job. As of 11/9: Pt. not having constipation due to colonic infection that has shifted her toward diarrhea. Having BM's daily, sometimes type 5.    Time 10    Period Weeks    Status On-going    Target Date 05/04/20                 Plan - 03/30/20 1056    Clinical Impression Statement Pt. Responded well to all interventions today, demonstrating improved HA/decreasesed spasm and increased length of connective tissue surrounding L4-5 multifidus, as well as understanding and correct performance of all education and exercises provided today. They will continue to benefit from skilled physical therapy to work toward remaining goals and maximize function as well as decrease likelihood of symptom increase or recurrence.     PT Next Visit Plan Ask about taping/re-apply, further suprapubic scar work, deep-core, scapular tracking/strength, and balance, MFR to lower abdomen, Review pain diary, cupping to medial thighs and upper back, re-assess neural glides on LLE, focus on graded activity, and seated and standing balance/core strength. review I's Y's and T's in prone, thoracic mobility? perform further release and PNF for R>L shoulder blades, more scar-release and cupping at suprapubic scar, MFR around B ilium. continue to work on thoracolumbar rotation ROM and decreasing spasms, add SLS balance training, .    PT Home Exercise Plan A day: low back stretch, side-stretch, hamstring stretch, butterfly stretch, hip EXT in prone with knee bent, side-lying hip ABD, calf raises. B day: Chest-stretch, Thoracic extensions over a towel roll, seated rows and seated scapular retraction (pull shoulder blades  together like "T's") with band, child's pose stretch, self internal TP release (especially when Pelvic floor spasms high), Every day: Pain diary, Posterior pelvic tilts with deep core activation and red band for obliques activation, bow-and-arrow, TENS unit 3+ times per day for 30 min and as needed if pain starts to increase. TENS at ASIS/PSIS at strong tingle for 1 hr. per day. Sleeper stretch PRN, vagal nerve toning    Consulted and Agree with Plan of Care Patient           Patient will benefit from skilled therapeutic intervention in order to improve the following deficits and impairments:     Visit Diagnosis: Other muscle spasm  Muscle weakness (generalized)  Difficulty in walking, not elsewhere classified     Problem List Patient Active Problem List   Diagnosis Date Noted  . Seizure ([place]) 11/11/2018  . Major depressive disorder, recurrent episode, moderate ([place]) 02/07/2018  . Nephrolithiasis 04/16/2016  . Numbness 07/28/2015  . Bladder retention 06/23/2015  . Abdominal pain 06/04/2015  . Dizziness 05/18/2015  . Neck pain 05/18/2015  . Complicated migraine [id number]/[id number]  . Other fatigue 04/28/2015  . Abnormal finding on MRI of brain 04/28/2015  . D (diarrhea) 03/29/2015  . H/O disease 03/29/2015  . Abnormal weight loss 03/29/2015  . Muscle weakness (generalized) 03/14/2015  . Headache, migraine 03/10/2015   Willa Rough DPT, ATC Willa Rough 03/30/2020, 4:15 PM  [place] MAIN [facility] SERVICES [address] [place], [place], [address] Phone: [phone]   Fax:  [phone]  Name: Christine Chavez MRN: [id number] Date of Birth: 1980/06/18

## 2020-04-01 ENCOUNTER — Ambulatory Visit: Payer: BC Managed Care – PPO

## 2020-04-06 ENCOUNTER — Ambulatory Visit: Payer: BC Managed Care – PPO

## 2020-04-08 ENCOUNTER — Ambulatory Visit: Payer: BC Managed Care – PPO

## 2020-04-08 ENCOUNTER — Other Ambulatory Visit: Payer: Self-pay

## 2020-04-08 DIAGNOSIS — M62838 Other muscle spasm: Secondary | ICD-10-CM | POA: Diagnosis not present

## 2020-04-08 DIAGNOSIS — M6281 Muscle weakness (generalized): Secondary | ICD-10-CM

## 2020-04-08 DIAGNOSIS — R262 Difficulty in walking, not elsewhere classified: Secondary | ICD-10-CM

## 2020-04-08 NOTE — Therapy (Signed)
[place] MAIN [facility] SERVICES [address] [place], [place], [address] Phone: [phone]   Fax:  [phone]  Physical Therapy Treatment  The patient has been informed of current processes in place at Outpatient Rehab to protect patients from Covid-19 exposure including social distancing, schedule modifications, and new cleaning procedures. After discussing their particular risk with a therapist based on the patient's personal risk factors, the patient has decided to proceed with in-person therapy.  Patient Details  Name: Christine Chavez MRN: [id number] Date of Birth: April 07, 1981 No data recorded  Encounter Date: 04/08/2020   PT End of Session - 04/08/20 1507    Visit Number 19    Number of Visits 32    Date for PT Re-Evaluation 05/04/20    Authorization Type [employer]    Authorization Time Period from 02/24/20 through 05/04/20    Authorization - Visit Number 65    Authorization - Number of Visits 30    Progress Note Due on Visit 64    PT Start Time 1035    PT Stop Time 1135    PT Time Calculation (min) 60 min    Activity Tolerance Patient tolerated treatment well;No increased pain    Behavior During Therapy WFL for tasks assessed/performed           Past Medical History:  Diagnosis Date   Complication of anesthesia    ? seizures after anesthesia    Headache    Migraines    Neurogenic bladder    Renal disorder    Vision abnormalities     Past Surgical History:  Procedure Laterality Date   ANTERIOR CRUCIATE LIGAMENT REPAIR  1997   APPENDECTOMY     COLONOSCOPY WITH PROPOFOL N/A 11/11/2018   Procedure: COLONOSCOPY WITH PROPOFOL;  Surgeon: [name], MD;  Location: [facility] ENDOSCOPY;  Service: Endoscopy;  Laterality: N/A;   CYSTOSCOPY WITH STENT PLACEMENT Right 04/17/2016   Procedure: CYSTOSCOPY WITH STENT PLACEMENT;  Surgeon: [name], MD;  Location: [facility] ORS;  Service: Urology;  Laterality: Right;    ESOPHAGOGASTRODUODENOSCOPY (EGD) WITH PROPOFOL N/A 11/11/2018   Procedure: ESOPHAGOGASTRODUODENOSCOPY (EGD) WITH PROPOFOL;  Surgeon: [name], MD;  Location: [facility] ENDOSCOPY;  Service: Endoscopy;  Laterality: N/A;   EXPLORATORY LAPAROTOMY  1999   KIDNEY STONE SURGERY  04/2016   REVISION UROSTOMY CUTANEOUS     REVISION UROSTOMY CUTANEOUS  01/10/2018   SUPRAPUBIC CATHETER PLACEMENT  08/2017   TONSILLECTOMY      There were no vitals filed for this visit.   Pelvic Floor Physical Therapy Treatment Note  SCREENING  Changes in medications, allergies, or medical history?: none  SUBJECTIVE  Patient reports: Has been having bad migraines and bladder spasms this week. Woke up with a bad headache and then it started at the base of the skull and progressed and shot at an angle toward her eye. Has been smelling her urine a lot again, does not smell like ammonia. Has had to use two oxybutynin, 1 valium and one suppository over the last week.    Precautions:  Neurological diagnosis pending, has "non-specific" brain lesions   Pain update: (numbers not updated due to Pt. Difficulty describing sensation) Location of pain: LB and neck/shoulders (bladder) Current pain:  5/10) (2/10) Max pain: 7/10 (5/10 when it spasms) Least pain:  2/10 (2/10) Nature of pain: sharp to dull ache (grabbing)  **feels "looser" and "less" pain in the neck and hips following treatment   Patient Goals: Get rid of the  discomfort with catheterizing and ache/bladder pain. Be able to take fewer medications and be able to enjoy spending time with her family again, doing "all the mom things"    OBJECTIVE  Changes in:  Observations/Posture:  L lumbar curve prominent with transverse processes more prominent on the L. (from previous note)  C3 spinous process deviated L through C2-3.    Range of Motion/Flexibilty:  (from 03/27/19) Spine: ~ 2-3 fingers from knee with SB B. ~ 50% reduced R rotation with  stretch in L, ~ 75% reduced L rotation with increased tightness. HS: able to achieve 90 deg. Hip flexion in seated HS stretch. (from prior treatment)  Decreased PIVM through Thoracic, lumbar, and sacral spine with greatest restriction at T3-6. (from prior session)  Strength/MMT:  LE MMT (from 03-27-19) LE MMT Left Right  Hip flex:  (L2) /5 /5  Hip ext: 3+/5 3+/5  Hip abd: 4/5 4/5  Hip add: 4/5 3+/5  Hip IR 4/5 3+/5  Hip ER 4/5 4/5   (From 3/24): Pt. Demonstrates 3/5 strength with "I's, Y's, and T's" but 4/5 with W's. (from 6/22) DF: 9 reps with B. Eversion/Inversion: 4/5 on R, 4+/5 on L. DF: L 4+/5, R 3+/5  Seated IR/ER on L: 4/5, R 3+/5 (from 8-5) L DF and Eversion 4+/5 with first 1-3 reps and fatigue decreased to 4/4. (9/9) Pt. Demonstrates ~ 4+/5 B with increased spasticity B.   Balance/stability:  (6-29) Pt. Able to achieve good posture without cueing but only able to maintain for ~ 2.5 min. Before seeking stability from the table and forward rolled shoulders. With cue to engage the deep core, Pt. Recognizes increased stability and comfort.  8/5: Pt. Demonstrating decreased stability in gait and standing balance today with fasciculations increasing in direct relation to muscular endurance required. Able to stand ~ 10 sec. In SLS with MIN UE support before intensity of spasms make attempt at balance futile.    Pelvic Floor External Exam: (From prior session)- [Introitus Appears: elevated Skin integrity: WNL Palpation: TTP to B STP Cough: limited motion Prolapse visible?: no Scar mobility: not assessed  Internal Vaginal Exam: Strength (PERF): Pt. Has difficulty sensing the PFM to generate contraction or relaxation due to high tone/neuro involvement Symmetry: L>R for spasm/TTP Palpation: TTP to all muscles throughout B Prolapse: none]  unable to coordinate squeeze and TTP to all areas with the greatest at B OI and coccygeus. ** following TP release Pt. Able to achieve 1/5  PFM contraction. Decreased TTP at B anterior PR/PC following treatment. (10/14).  TODAY: not re-assessed today.  Abdominal:  Pt. Demonstrated ability to recruit R>L obliques and TA with pelvic tilts. (from prior session)  Palpation: Decreased fascial and scar mobility through lower abdomen with radiating Sx. To the pelvis and LB. (from prior session)  TODAY: TTP to B iliacus and all cervical extensors on L, especially through C2-4  Gait Analysis:  INTERVENTIONS THIS SESSION:  Manual: Performed TP release to B iliacus to decrease spasm and pain and allow for improved balance of musculature for improved function and decreased symptoms.  Dry-needle: Performed TPDN with 4 .30x41mm needles as described below to allow for decreased spasm and pain and to allow for decrease pressure on pelvic nerves to maximize function.  Therex: Reviewed and practiced chin-tucks to help prevent return of spasms and migraines.    Total time: 60 min.                      Trigger  Point Dry Needling - 04/08/20 0001    Consent Given? Yes    Education Handout Provided No    Muscles Treated Back/Hip Iliacus    Dry Needling Comments Bilateral    Iliacus Response Twitch response elicited;Palpable increased muscle length                  PT Short Term Goals - 02/24/20 1142      PT SHORT TERM GOAL #1   Title Patient will demonstrate coordinated diaphragmatic breathing with pelvic tilts to demonstrate improved control of diaphragm and TA, to allow for further strengthening of core musculature and decreased pelvic floor spasm.    Baseline Pt. demonstrates breathing dysfunction and poor PFM coordination evidenced by anal manometry As of 2/22: Pt. continues to have restriction in her diaphragm and near T/L junction but is able to intentionally use diaphragmatic breathing through the available ROM.    Time 5    Period Weeks    Status Achieved    Target Date 04/20/19      PT SHORT TERM  GOAL #2   Title Patient will report a reduction in pain to no greater than 6/10 over the prior week to demonstrate symptom improvement.    Baseline Pain is 10/10 at worst, 2/10 at best As of 2/22: Pt. had achieved this goal for 1 week as of 1/21 but may have infection or other insult that caused pain to increase again. As of 4/29: Pain high of 8/10 but has been over-doing her activity over the past week. As of 6/10: Pt. was able to keep pain at 6 or below since prior visit by using TENS to help keep tension lower but has not had a full week to test if it will keep working. As of 7/6: Pt. had met this goal but has a new UTI and it caused pain to spike to 8/10 over the weekend. As of 11/9: Pt. has just finally made progress with long-standing UTI and new POC for treatment so she is still having pain spikes up to 8/10 but we expect greater improvement over the next few months.    Time 5    Period Weeks    Status On-going    Target Date 03/30/20      PT SHORT TERM GOAL #3   Title Patient will demonstrate HEP x1 in the clinic to demonstrate understanding and proper form to allow for further improvement.    Baseline Pt. lacks knowledge of therepeutic exercises that can decrease her pain/Sx.    Time 5    Period Weeks    Status Achieved    Target Date 04/20/19      PT SHORT TERM GOAL #4   Title Patient will report consistent use of foot-stool (squatty-potty) for positioning with BM to decrease pain with BM and intra-abdominal pressure.    Baseline Pt. having constipation due to PFM dysfunction    Time 5    Period Weeks    Status Achieved    Target Date 04/20/19             PT Long Term Goals - 02/24/20 0001      PT LONG TERM GOAL #1   Title Pt. will be able to participate in regular ADL's with least restrictive device without pain increasing greater than 2/10    Baseline Pt. limited in her ability to perform household duties by increased pain and fatigue. As of 4/29: Her legs have more  endurance with walking before she  gets a tremor. Still needs to sit down the majority of the time she is cooking. Still needs back support for prolonged sitting. As of 6/10: Pt. able to do for  ~1 hour before she needs to rest to prevent increased pain, pain is averaging ~ 4-5/10    Time 10    Period Weeks    Status On-going    Target Date 05/04/20      PT LONG TERM GOAL #2   Title Patient will score at or below 65/300  on the PFDI and 35% on the Female NIH-CPSI to demonstrate a clinically meaningful decrease in disability and distress due to pelvic floor dysfunction.    Baseline PFDI: 110/300, Female NIH-CPSI: 29/43 (67%) As of 4/29: 100/300 on PFDI and 30/43 on NIH-CPSI As of 7/6:  NIH-CPSI: 32/43 POPDI: 61/100, CRADI-8: 29/100, UDI: 44/100 (PFDI: 149/300)    Time 10    Period Weeks    Status On-going    Target Date 05/04/20      PT LONG TERM GOAL #3   Title Patient will report no pain with intercourse to demonstrate improved functional ability.    Baseline Pt. Having significant pain with intercourse. As of 4/29: is a little better (20-30%) but is worse on days where overall tension/pain is worse. As of 6/10: can have days where there is not much pain but other days where there is significant pain. As of 11/9: same as previous update.    Time 10    Period Weeks    Status On-going    Target Date 05/04/20      PT LONG TERM GOAL #4   Title Pt will report ability to work in her yard for greater than 30 minutes without extreme fatigue    Baseline limited to 10-15 minutes before pt requires ending activity. As of 4/29: ~ 20 min. As of 6/10: has not been gardening much but can do ~ 30 min of similar indoor activity level before increased fatigue.    Time 10    Period Weeks    Status Achieved    Target Date 10/23/19      PT LONG TERM GOAL #5   Title Pt. will be able to go 2-3 hours between emptying her bladder without increased pain and empty her bladder fully whether by urostomy, cath, or  voluntary release.    Baseline Pt. has a urostomy but continues to have to self-catheterize. Has pain with bladder filling and when using catheter. As of 4/29: is doing better since starting to take the pregabalin andd baclofen but occasionally still has pain with bladder filling. It is bad when she has an infection or over-does activity, better on "normal" days which have been few and far between due to frequent UTI's. As of 6/10: Pt. has "good" and "bad" days but has more days where she can go 2-3 hours without increased pain    Time 10    Period Weeks    Status On-going    Target Date 05/04/20      PT LONG TERM GOAL #6   Title Patient will report having BMs at least every-other day with consistency between [facility] stool scale 3-5 over the prior week to demonstrate decreased constipation.    Baseline Pt. unable to have regula BM's without medication, manometry shows PFM dysfunction. As of 4/29: still having to use and enema or supository occasionally and still needing linzess. As of 6/10: Pt. has days where she can empty ok and days where  she needs to use an Enema to stimulate a BM, feels that it is just the insertion of the enema, not the solution that does the job. As of 11/9: Pt. not having constipation due to colonic infection that has shifted her toward diarrhea. Having BM's daily, sometimes type 5.    Time 10    Period Weeks    Status On-going    Target Date 05/04/20                 Plan - 04/08/20 1508    Clinical Impression Statement Pt. Responded well to all interventions today, demonstrating improved cervical alignment, decreased spasms and pain, as well as understanding and correct performance of all education and exercises provided today. They will continue to benefit from skilled physical therapy to work toward remaining goals and maximize function as well as decrease likelihood of symptom increase or recurrence.    PT Next Visit Plan Ask about taping/re-apply, further  suprapubic scar work, deep-core, scapular tracking/strength, and balance, MFR to lower abdomen, Review pain diary, cupping to medial thighs and upper back, re-assess neural glides on LLE, focus on graded activity, and seated and standing balance/core strength. review I's Y's and T's in prone, thoracic mobility? perform further release and PNF for R>L shoulder blades, more scar-release and cupping at suprapubic scar, MFR around B ilium. continue to work on thoracolumbar rotation ROM and decreasing spasms, add SLS balance training, .    PT Home Exercise Plan A day: low back stretch, side-stretch, hamstring stretch, butterfly stretch, hip EXT in prone with knee bent, side-lying hip ABD, calf raises. B day: Chest-stretch, Thoracic extensions over a towel roll, seated rows and seated scapular retraction (pull shoulder blades together like "T's") with band, child's pose stretch, self internal TP release (especially when Pelvic floor spasms high), Every day: Pain diary, Posterior pelvic tilts with deep core activation and red band for obliques activation, bow-and-arrow, TENS unit 3+ times per day for 30 min and as needed if pain starts to increase. TENS at ASIS/PSIS at strong tingle for 1 hr. per day. Sleeper stretch PRN, vagal nerve toning    Consulted and Agree with Plan of Care Patient           Patient will benefit from skilled therapeutic intervention in order to improve the following deficits and impairments:     Visit Diagnosis: Other muscle spasm  Muscle weakness (generalized)  Difficulty in walking, not elsewhere classified     Problem List Patient Active Problem List   Diagnosis Date Noted   Seizure ([place]) 11/11/2018   Major depressive disorder, recurrent episode, moderate ([place]) 02/07/2018   Nephrolithiasis 04/16/2016   Numbness 07/28/2015   Bladder retention 06/23/2015   Abdominal pain 06/04/2015   Dizziness 05/18/2015   Neck pain 67/67/2094   Complicated migraine  70/96/2836   Other fatigue 04/28/2015   Abnormal finding on MRI of brain 04/28/2015   D (diarrhea) 03/29/2015   H/O disease 03/29/2015   Abnormal weight loss 03/29/2015   Muscle weakness (generalized) 03/14/2015   Headache, migraine 03/10/2015   Willa Rough DPT, ATC Willa Rough 04/08/2020, 3:11 PM  [place] MA[facility] SERVICES [address] [place], [place], [address] Phone: [phone]   Fax:  [phone]  Name: Christine Chavez MRN: [id number] Date of Birth: [birth year]

## 2020-04-13 ENCOUNTER — Ambulatory Visit: Payer: BC Managed Care – PPO

## 2020-04-13 ENCOUNTER — Other Ambulatory Visit: Payer: Self-pay

## 2020-04-13 DIAGNOSIS — M6281 Muscle weakness (generalized): Secondary | ICD-10-CM

## 2020-04-13 DIAGNOSIS — M62838 Other muscle spasm: Secondary | ICD-10-CM | POA: Diagnosis not present

## 2020-04-13 DIAGNOSIS — R262 Difficulty in walking, not elsewhere classified: Secondary | ICD-10-CM

## 2020-04-13 NOTE — Therapy (Signed)
[place] MAIN [facility] SERVICES [address] [place], [place], [address] Phone: [phone]   Fax:  [phone]  Physical Therapy Treatment  The patient has been informed of current processes in place at Outpatient Rehab to protect patients from Covid-19 exposure including social distancing, schedule modifications, and new cleaning procedures. After discussing their particular risk with a therapist based on the patient's personal risk factors, the patient has decided to proceed with in-person therapy.  Patient Details  Name: Christine Chavez MRN: [id number] Date of Birth: 1980-09-15 No data recorded  Encounter Date: 04/13/2020   PT End of Session - 04/13/20 1144    Visit Number 63    Number of Visits 59    Date for PT Re-Evaluation 05/04/20    Authorization Type [employer]    Authorization Time Period from 02/24/20 through 05/04/20    Authorization - Visit Number 93    Authorization - Number of Visits 30    Progress Note Due on Visit 47    PT Start Time 2751    PT Stop Time 1135    PT Time Calculation (min) 60 min    Activity Tolerance Patient tolerated treatment well;No increased pain    Behavior During Therapy WFL for tasks assessed/performed           Past Medical History:  Diagnosis Date   Complication of anesthesia    ? seizures after anesthesia    Headache    Migraines    Neurogenic bladder    Renal disorder    Vision abnormalities     Past Surgical History:  Procedure Laterality Date   ANTERIOR CRUCIATE LIGAMENT REPAIR  1997   APPENDECTOMY     COLONOSCOPY WITH PROPOFOL N/A 11/11/2018   Procedure: COLONOSCOPY WITH PROPOFOL;  Surgeon: [name], MD;  Location: [facility] ENDOSCOPY;  Service: Endoscopy;  Laterality: N/A;   CYSTOSCOPY WITH STENT PLACEMENT Right 04/17/2016   Procedure: CYSTOSCOPY WITH STENT PLACEMENT;  Surgeon: [name], MD;  Location: [facility] ORS;  Service: Urology;  Laterality: Right;    ESOPHAGOGASTRODUODENOSCOPY (EGD) WITH PROPOFOL N/A 11/11/2018   Procedure: ESOPHAGOGASTRODUODENOSCOPY (EGD) WITH PROPOFOL;  Surgeon: [name], MD;  Location: [facility] ENDOSCOPY;  Service: Endoscopy;  Laterality: N/A;   EXPLORATORY LAPAROTOMY  1999   KIDNEY STONE SURGERY  04/2016   REVISION UROSTOMY CUTANEOUS     REVISION UROSTOMY CUTANEOUS  01/10/2018   SUPRAPUBIC CATHETER PLACEMENT  08/2017   TONSILLECTOMY      There were no vitals filed for this visit.  Pelvic Floor Physical Therapy Treatment Note  SCREENING  Changes in medications, allergies, or medical history?: none  SUBJECTIVE  Patient reports: She has had headaches still some but not as bad as it had been, feels like what we did last week helped a lot. Has been over-doing it some over the holiday and feeling run-down and tired. Has had some bladder spasms have been about the same but occasionally spike. She has been forgetting to use the TENS unit, forgot to write down numbers in her pain journal. Has a little LB pain but thinks she is about to start her period. Feels like her bladder spasms were less after the TPDN to her Iliacus last time.  Precautions:  Neurological diagnosis pending, has "non-specific" brain lesions   Pain update: (numbers not updated due to Pt. Difficulty describing sensation) Location of pain: LB and neck/shoulders (bladder) Current pain:  5/10) (2/10) Max pain: 7/10 (5/10 when it spasms) Least pain:  2/10 (  2/10) Nature of pain: sharp to dull ache (grabbing)  **feeling achy from TPDN following treatment.  Patient Goals: Get rid of the discomfort with catheterizing and ache/bladder pain. Be able to take fewer medications and be able to enjoy spending time with her family again, doing "all the mom things"    OBJECTIVE  Changes in:  Observations/Posture:  L lumbar curve prominent with transverse processes more prominent on the L. (from previous note)  C3 spinous process deviated L  through C2-3. (from previous note)   Range of Motion/Flexibilty:  (from 03/27/19) Spine: ~ 2-3 fingers from knee with SB B. ~ 50% reduced R rotation with stretch in L, ~ 75% reduced L rotation with increased tightness. HS: able to achieve 90 deg. Hip flexion in seated HS stretch. (from prior treatment)  Decreased PIVM through Thoracic, lumbar, and sacral spine with greatest restriction at T3-6. (from prior session)  Strength/MMT:  LE MMT (from 03-27-19) LE MMT Left Right  Hip flex:  (L2) /5 /5  Hip ext: 3+/5 3+/5  Hip abd: 4/5 4/5  Hip add: 4/5 3+/5  Hip IR 4/5 3+/5  Hip ER 4/5 4/5   (From 3/24): Pt. Demonstrates 3/5 strength with "I's, Y's, and T's" but 4/5 with W's. (from 6/22) DF: 9 reps with B. Eversion/Inversion: 4/5 on R, 4+/5 on L. DF: L 4+/5, R 3+/5  Seated IR/ER on L: 4/5, R 3+/5 (from 8-5) L DF and Eversion 4+/5 with first 1-3 reps and fatigue decreased to 4/4. (9/9) Pt. Demonstrates ~ 4+/5 B with increased spasticity B.   Balance/stability:  (6-29) Pt. Able to achieve good posture without cueing but only able to maintain for ~ 2.5 min. Before seeking stability from the table and forward rolled shoulders. With cue to engage the deep core, Pt. Recognizes increased stability and comfort.  8/5: Pt. Demonstrating decreased stability in gait and standing balance today with fasciculations increasing in direct relation to muscular endurance required. Able to stand ~ 10 sec. In SLS with MIN UE support before intensity of spasms make attempt at balance futile.    Pelvic Floor External Exam: (From prior session)- [Introitus Appears: elevated Skin integrity: WNL Palpation: TTP to B STP Cough: limited motion Prolapse visible?: no Scar mobility: not assessed  Internal Vaginal Exam: Strength (PERF): Pt. Has difficulty sensing the PFM to generate contraction or relaxation due to high tone/neuro involvement Symmetry: L>R for spasm/TTP Palpation: TTP to all muscles throughout  B Prolapse: none]  unable to coordinate squeeze and TTP to all areas with the greatest at B OI and coccygeus. ** following TP release Pt. Able to achieve 1/5 PFM contraction. Decreased TTP at B anterior PR/PC following treatment. (10/14).  TODAY: not re-assessed today.  Abdominal:  Pt. Demonstrated ability to recruit R>L obliques and TA with pelvic tilts. (from prior session)  Palpation: Decreased fascial and scar mobility through lower abdomen with radiating Sx. To the pelvis and LB. (from prior session)  TODAY: TTP to B Psoas>Iliacus  Gait Analysis:  INTERVENTIONS THIS SESSION:  Manual: Performed TP release and STM to B Psoas to decrease spasm and pain and allow for improved balance of musculature for improved function and decreased symptoms.  Dry-needle: Performed TPDN with 4 .30x27m needles as described below to allow for decreased spasm and pain and to allow for decrease pressure on pelvic nerves to maximize function.  Therex: Reviewed and practiced hip-flexor stretch To maintain and improve muscle length and allow for improved balance of musculature for long-term symptom relief.  Total time: 60 min.                         Trigger Point Dry Needling - 04/13/20 0001    Consent Given? Yes    Education Handout Provided No    Muscles Treated Back/Hip Iliopsoas    Dry Needling Comments Psoas Bilaterally    Iliopsoas Response Twitch response elicited;Palpable increased muscle length                  PT Short Term Goals - 02/24/20 1142      PT SHORT TERM GOAL #1   Title Patient will demonstrate coordinated diaphragmatic breathing with pelvic tilts to demonstrate improved control of diaphragm and TA, to allow for further strengthening of core musculature and decreased pelvic floor spasm.    Baseline Pt. demonstrates breathing dysfunction and poor PFM coordination evidenced by anal manometry As of 2/22: Pt. continues to have restriction in her  diaphragm and near T/L junction but is able to intentionally use diaphragmatic breathing through the available ROM.    Time 5    Period Weeks    Status Achieved    Target Date 04/20/19      PT SHORT TERM GOAL #2   Title Patient will report a reduction in pain to no greater than 6/10 over the prior week to demonstrate symptom improvement.    Baseline Pain is 10/10 at worst, 2/10 at best As of 2/22: Pt. had achieved this goal for 1 week as of 1/21 but may have infection or other insult that caused pain to increase again. As of 4/29: Pain high of 8/10 but has been over-doing her activity over the past week. As of 6/10: Pt. was able to keep pain at 6 or below since prior visit by using TENS to help keep tension lower but has not had a full week to test if it will keep working. As of 7/6: Pt. had met this goal but has a new UTI and it caused pain to spike to 8/10 over the weekend. As of 11/9: Pt. has just finally made progress with long-standing UTI and new POC for treatment so she is still having pain spikes up to 8/10 but we expect greater improvement over the next few months.    Time 5    Period Weeks    Status On-going    Target Date 03/30/20      PT SHORT TERM GOAL #3   Title Patient will demonstrate HEP x1 in the clinic to demonstrate understanding and proper form to allow for further improvement.    Baseline Pt. lacks knowledge of therepeutic exercises that can decrease her pain/Sx.    Time 5    Period Weeks    Status Achieved    Target Date 04/20/19      PT SHORT TERM GOAL #4   Title Patient will report consistent use of foot-stool (squatty-potty) for positioning with BM to decrease pain with BM and intra-abdominal pressure.    Baseline Pt. having constipation due to PFM dysfunction    Time 5    Period Weeks    Status Achieved    Target Date 04/20/19             PT Long Term Goals - 02/24/20 0001      PT LONG TERM GOAL #1   Title Pt. will be able to participate in regular  ADL's with least restrictive device without pain increasing greater than 2/10  Baseline Pt. limited in her ability to perform household duties by increased pain and fatigue. As of 4/29: Her legs have more endurance with walking before she gets a tremor. Still needs to sit down the majority of the time she is cooking. Still needs back support for prolonged sitting. As of 6/10: Pt. able to do for  ~1 hour before she needs to rest to prevent increased pain, pain is averaging ~ 4-5/10    Time 10    Period Weeks    Status On-going    Target Date 05/04/20      PT LONG TERM GOAL #2   Title Patient will score at or below 65/300  on the PFDI and 35% on the Female NIH-CPSI to demonstrate a clinically meaningful decrease in disability and distress due to pelvic floor dysfunction.    Baseline PFDI: 110/300, Female NIH-CPSI: 29/43 (67%) As of 4/29: 100/300 on PFDI and 30/43 on NIH-CPSI As of 7/6:  NIH-CPSI: 32/43 POPDI: 61/100, CRADI-8: 29/100, UDI: 44/100 (PFDI: 149/300)    Time 10    Period Weeks    Status On-going    Target Date 05/04/20      PT LONG TERM GOAL #3   Title Patient will report no pain with intercourse to demonstrate improved functional ability.    Baseline Pt. Having significant pain with intercourse. As of 4/29: is a little better (20-30%) but is worse on days where overall tension/pain is worse. As of 6/10: can have days where there is not much pain but other days where there is significant pain. As of 11/9: same as previous update.    Time 10    Period Weeks    Status On-going    Target Date 05/04/20      PT LONG TERM GOAL #4   Title Pt will report ability to work in her yard for greater than 30 minutes without extreme fatigue    Baseline limited to 10-15 minutes before pt requires ending activity. As of 4/29: ~ 20 min. As of 6/10: has not been gardening much but can do ~ 30 min of similar indoor activity level before increased fatigue.    Time 10    Period Weeks    Status  Achieved    Target Date 10/23/19      PT LONG TERM GOAL #5   Title Pt. will be able to go 2-3 hours between emptying her bladder without increased pain and empty her bladder fully whether by urostomy, cath, or voluntary release.    Baseline Pt. has a urostomy but continues to have to self-catheterize. Has pain with bladder filling and when using catheter. As of 4/29: is doing better since starting to take the pregabalin andd baclofen but occasionally still has pain with bladder filling. It is bad when she has an infection or over-does activity, better on "normal" days which have been few and far between due to frequent UTI's. As of 6/10: Pt. has "good" and "bad" days but has more days where she can go 2-3 hours without increased pain    Time 10    Period Weeks    Status On-going    Target Date 05/04/20      PT LONG TERM GOAL #6   Title Patient will report having BMs at least every-other day with consistency between [facility] stool scale 3-5 over the prior week to demonstrate decreased constipation.    Baseline Pt. unable to have regula BM's without medication, manometry shows PFM dysfunction. As of 4/29:  still having to use and enema or supository occasionally and still needing linzess. As of 6/10: Pt. has days where she can empty ok and days where she needs to use an Enema to stimulate a BM, feels that it is just the insertion of the enema, not the solution that does the job. As of 11/9: Pt. not having constipation due to colonic infection that has shifted her toward diarrhea. Having BM's daily, sometimes type 5.    Time 10    Period Weeks    Status On-going    Target Date 05/04/20                 Plan - 04/13/20 1144    Clinical Impression Statement Pt. Responded well to all interventions today, demonstrating decreased spasm and TTP on R>L through Psoas, as well as understanding and correct performance of all education and exercises provided today. They will continue to benefit from  skilled physical therapy to work toward remaining goals and maximize function as well as decrease likelihood of symptom increase or recurrence.    PT Next Visit Plan Ask about taping/re-apply, further suprapubic scar work, deep-core, scapular tracking/strength, and balance, MFR to lower abdomen, Review pain diary, cupping to medial thighs and upper back, re-assess neural glides on LLE, focus on graded activity, and seated and standing balance/core strength. review I's Y's and T's in prone, thoracic mobility? perform further release and PNF for R>L shoulder blades, more scar-release and cupping at suprapubic scar, MFR around B ilium. continue to work on thoracolumbar rotation ROM and decreasing spasms, add SLS balance training, .    PT Home Exercise Plan A day: low back stretch, side-stretch, hamstring stretch, butterfly stretch, hip EXT in prone with knee bent, side-lying hip ABD, calf raises. B day: Chest-stretch, Thoracic extensions over a towel roll, seated rows and seated scapular retraction (pull shoulder blades together like "T's") with band, child's pose stretch, self internal TP release (especially when Pelvic floor spasms high), Every day: Pain diary, Posterior pelvic tilts with deep core activation and red band for obliques activation, bow-and-arrow, TENS unit 3+ times per day for 30 min and as needed if pain starts to increase. TENS at ASIS/PSIS at strong tingle for 1 hr. per day. Sleeper stretch PRN, vagal nerve toning    Consulted and Agree with Plan of Care Patient           Patient will benefit from skilled therapeutic intervention in order to improve the following deficits and impairments:     Visit Diagnosis: Other muscle spasm  Muscle weakness (generalized)  Difficulty in walking, not elsewhere classified     Problem List Patient Active Problem List   Diagnosis Date Noted   Seizure ([place]) 11/11/2018   Major depressive disorder, recurrent episode, moderate ([place]) 02/07/2018    Nephrolithiasis 04/16/2016   Numbness 07/28/2015   Bladder retention 06/23/2015   Abdominal pain 06/04/2015   Dizziness 05/18/2015   Neck pain 88/82/8003   Complicated migraine 49/17/9150   Other fatigue 04/28/2015   Abnormal finding on MRI of brain 04/28/2015   D (diarrhea) 03/29/2015   H/O disease 03/29/2015   Abnormal weight loss 03/29/2015   Muscle weakness (generalized) 03/14/2015   Headache, migraine 03/10/2015   Willa Rough DPT, ATC Willa Rough 04/13/2020, 3:34 PM  [place] MAIN [facility] SERVICES [address] [place], [place], [address] Phone: [phone]   Fax:  [phone]  Name: Christine Chavez MRN: [id number] Date of Birth: January 16, 1981

## 2020-04-13 NOTE — Patient Instructions (Signed)
     Bring both knees up to your chest and then hold the one farthest from the edge of the table/bed and let the other relax toward the floor until stretch is felt through the front of the hip.   Hold-relax: Gently lift the knee of the down leg up ~ 1/2 and inch and hold for 5 seconds, then let it relax all the way for a second and repeat 4 more times to decrease resting tension in the muscle.   Stretch: Let the leg relax and feel a stretch across the front of the hip/thigh as you take belly breaths. Hold for __5__ deep belly breaths. Relax. Repeat __2-3__ times per side.

## 2020-04-15 ENCOUNTER — Other Ambulatory Visit: Payer: Self-pay

## 2020-04-15 ENCOUNTER — Ambulatory Visit: Payer: BC Managed Care – PPO

## 2020-04-15 DIAGNOSIS — M62838 Other muscle spasm: Secondary | ICD-10-CM

## 2020-04-15 DIAGNOSIS — R262 Difficulty in walking, not elsewhere classified: Secondary | ICD-10-CM

## 2020-04-15 DIAGNOSIS — M6281 Muscle weakness (generalized): Secondary | ICD-10-CM

## 2020-04-15 NOTE — Therapy (Signed)
[facility] MAIN [facility] SERVICES [address] [place], [place], [address] Phone: [phone]   Fax:  [phone]  Physical Therapy Treatment  The patient has been informed of current processes in place at Outpatient Rehab to protect patients from Covid-19 exposure including social distancing, schedule modifications, and new cleaning procedures. After discussing their particular risk with a therapist based on the patient's personal risk factors, the patient has decided to proceed with in-person therapy.  Patient Details  Name: Christine Chavez MRN: [id number] Date of Birth: Aug 22, 1980 No data recorded  Encounter Date: 04/15/2020   PT End of Session - 04/15/20 1152    Visit Number 79    Number of Visits 98    Date for PT Re-Evaluation 05/04/20    Authorization Type [employer]    Authorization Time Period from 02/24/20 through 05/04/20    Authorization - Visit Number 1    Authorization - Number of Visits 20    Progress Note Due on Visit 98    PT Start Time 1035    PT Stop Time 1135    PT Time Calculation (min) 60 min    Activity Tolerance Patient tolerated treatment well;No increased pain    Behavior During Therapy WFL for tasks assessed/performed           Past Medical History:  Diagnosis Date  . Complication of anesthesia    ? seizures after anesthesia   . Headache   . Migraines   . Neurogenic bladder   . Renal disorder   . Vision abnormalities     Past Surgical History:  Procedure Laterality Date  . ANTERIOR CRUCIATE LIGAMENT REPAIR  1997  . APPENDECTOMY    . COLONOSCOPY WITH PROPOFOL N/A 11/11/2018   Procedure: COLONOSCOPY WITH PROPOFOL;  Surgeon: Christena Deem, MD;  Location: [facility] ENDOSCOPY;  Service: Endoscopy;  Laterality: N/A;  . CYSTOSCOPY WITH STENT PLACEMENT Right 04/17/2016   Procedure: CYSTOSCOPY WITH STENT PLACEMENT;  Surgeon: Malen Gauze, MD;  Location: [facility] ORS;  Service: Urology;  Laterality: Right;  .  ESOPHAGOGASTRODUODENOSCOPY (EGD) WITH PROPOFOL N/A 11/11/2018   Procedure: ESOPHAGOGASTRODUODENOSCOPY (EGD) WITH PROPOFOL;  Surgeon: Christena Deem, MD;  Location: [facility] ENDOSCOPY;  Service: Endoscopy;  Laterality: N/A;  . EXPLORATORY LAPAROTOMY  1999  . KIDNEY STONE SURGERY  04/2016  . REVISION UROSTOMY CUTANEOUS    . REVISION UROSTOMY CUTANEOUS  01/10/2018  . SUPRAPUBIC CATHETER PLACEMENT  08/2017  . TONSILLECTOMY      There were no vitals filed for this visit.  Pelvic Floor Physical Therapy Treatment Note  SCREENING  Changes in medications, allergies, or medical history?: none  SUBJECTIVE  Patient reports: She is feeling sluggish, feels like her neuro stuff is a little cranked up, she is feeling like her equilibrium is off a little and her LLE feels cold and less reactive. Her LB is crampy/achy. Used the TENS unit to help decrease spasms some. She is feeling nauseated today after eating more than normal and too late last night. Left side held on longer on Tuesday and calmed down yesterday but is irritated again today.  Precautions:  Neurological diagnosis pending, has "non-specific" brain lesions   Pain update:  Location of pain: LB and neck/shoulders (bladder) Current pain:  5/10) (4/10) Max pain: 6/10 (6/10 when it spasms) Least pain:  2/10 (2/10) Nature of pain: sharp to dull ache (grabbing)  **no increased pain following treatment.  Patient Goals: Get rid of the discomfort with catheterizing and ache/bladder pain. Be  able to take fewer medications and be able to enjoy spending time with her family again, doing "all the mom things"    OBJECTIVE  Changes in:  Observations/Posture:  L lumbar curve prominent with transverse processes more prominent on the L. (from previous note)  C3 spinous process deviated L through C2-3. (from previous note)  TODAY: L up-slip and anterior rotation  **following treatment L iliacus remained tight, not allowing for complete  up-slip or rotation correction.   Range of Motion/Flexibilty:  (from 03/27/19) Spine: ~ 2-3 fingers from knee with SB B. ~ 50% reduced R rotation with stretch in L, ~ 75% reduced L rotation with increased tightness. HS: able to achieve 90 deg. Hip flexion in seated HS stretch. (from prior treatment)  Decreased PIVM through Thoracic, lumbar, and sacral spine with greatest restriction at T3-6. (from prior session)  TODAY: no decreased mobility at the sacrum with attempt at manual correction for L anterior rotation, hip-flexor spasm keeping Pt. From approp  Strength/MMT:  LE MMT (from 03-27-19) LE MMT Left Right  Hip flex:  (L2) /5 /5  Hip ext: 3+/5 3+/5  Hip abd: 4/5 4/5  Hip add: 4/5 3+/5  Hip IR 4/5 3+/5  Hip ER 4/5 4/5   (From 3/24): Pt. Demonstrates 3/5 strength with "I's, Y's, and T's" but 4/5 with W's. (from 6/22) DF: 9 reps with B. Eversion/Inversion: 4/5 on R, 4+/5 on L. DF: L 4+/5, R 3+/5  Seated IR/ER on L: 4/5, R 3+/5 (from 8-5) L DF and Eversion 4+/5 with first 1-3 reps and fatigue decreased to 4/4. (9/9) Pt. Demonstrates ~ 4+/5 B with increased spasticity B.   Balance/stability:  (6-29) Pt. Able to achieve good posture without cueing but only able to maintain for ~ 2.5 min. Before seeking stability from the table and forward rolled shoulders. With cue to engage the deep core, Pt. Recognizes increased stability and comfort.  8/5: Pt. Demonstrating decreased stability in gait and standing balance today with fasciculations increasing in direct relation to muscular endurance required. Able to stand ~ 10 sec. In SLS with MIN UE support before intensity of spasms make attempt at balance futile.   12/30: Pt. Reports feeling less stable on LLE again, similar to August but less intense.    Pelvic Floor External Exam: (From prior session)- [Introitus Appears: elevated Skin integrity: WNL Palpation: TTP to B STP Cough: limited motion Prolapse visible?: no Scar mobility:  not assessed  Internal Vaginal Exam: Strength (PERF): Pt. Has difficulty sensing the PFM to generate contraction or relaxation due to high tone/neuro involvement Symmetry: L>R for spasm/TTP Palpation: TTP to all muscles throughout B Prolapse: none]  unable to coordinate squeeze and TTP to all areas with the greatest at B OI and coccygeus. ** following TP release Pt. Able to achieve 1/5 PFM contraction. Decreased TTP at B anterior PR/PC following treatment. (10/14).  TODAY: not re-assessed today.  Abdominal:  Pt. Demonstrated ability to recruit R>L obliques and TA with pelvic tilts. (from prior session)  Palpation: Decreased fascial and scar mobility through lower abdomen with radiating Sx. To the pelvis and LB. (from prior session)  TODAY: TTP to L QL and Iliacus  Gait Analysis:  INTERVENTIONS THIS SESSION:  Manual: Performed TP release and STM to L QL followed by an up-slip correction to attempt to decrease spasm and pain and allow for improved balance of musculature for improved function and decreased symptoms.  Dry-needle: Performed TPDN with a .30x50mm needle as described below to allow for  decreased spasm and pain and to allow for decrease pressure on pelvic nerves to maximize function.  Therex: Reviewed and practiced hip-flexor stretch with hold-relax and standing balance To maintain and improve muscle length and allow for improved balance of musculature for long-term symptom relief.   Total time: 60 min.                              PT Short Term Goals - 04/15/20 1044      PT SHORT TERM GOAL #1   Title Patient will demonstrate coordinated diaphragmatic breathing with pelvic tilts to demonstrate improved control of diaphragm and TA, to allow for further strengthening of core musculature and decreased pelvic floor spasm.    Baseline Pt. demonstrates breathing dysfunction and poor PFM coordination evidenced by anal manometry As of 2/22: Pt.  continues to have restriction in her diaphragm and near T/L junction but is able to intentionally use diaphragmatic breathing through the available ROM.    Time 5    Period Weeks    Status Achieved    Target Date 04/20/19      PT SHORT TERM GOAL #2   Title Patient will report a reduction in pain to no greater than 6/10 over the prior week to demonstrate symptom improvement.    Baseline Pain is 10/10 at worst, 2/10 at best As of 2/22: Pt. had achieved this goal for 1 week as of 1/21 but may have infection or other insult that caused pain to increase again. As of 4/29: Pain high of 8/10 but has been over-doing her activity over the past week. As of 6/10: Pt. was able to keep pain at 6 or below since prior visit by using TENS to help keep tension lower but has not had a full week to test if it will keep working. As of 7/6: Pt. had met this goal but has a new UTI and it caused pain to spike to 8/10 over the weekend. As of 11/9: Pt. has just finally made progress with long-standing UTI and new POC for treatment so she is still having pain spikes up to 8/10 but we expect greater improvement over the next few months. As of 12/30: still feels like she is trending in a better direction but over-did it over the holidays and pain spiked to 8/10. No higher than 6/10 over last 2 days, even with PMS cramping.    Time 5    Period Weeks    Status On-going    Target Date 06/24/20      PT SHORT TERM GOAL #3   Title Patient will demonstrate HEP x1 in the clinic to demonstrate understanding and proper form to allow for further improvement.    Baseline Pt. lacks knowledge of therepeutic exercises that can decrease her pain/Sx.    Time 5    Period Weeks    Status Achieved    Target Date 04/20/19      PT SHORT TERM GOAL #4   Title Patient will report consistent use of foot-stool (squatty-potty) for positioning with BM to decrease pain with BM and intra-abdominal pressure.    Baseline Pt. having constipation due to  PFM dysfunction    Time 5    Period Weeks    Status Achieved    Target Date 04/20/19             PT Long Term Goals - 04/15/20 0001      PT LONG  TERM GOAL #1   Title Pt. will be able to participate in regular ADL's with least restrictive device without pain increasing greater than 2/10    Baseline Pt. limited in her ability to perform household duties by increased pain and fatigue. As of 4/29: Her legs have more endurance with walking before she gets a tremor. Still needs to sit down the majority of the time she is cooking. Still needs back support for prolonged sitting. As of 6/10: Pt. able to do for  ~1 hour before she needs to rest to prevent increased pain, pain is averaging ~ 4-5/10    Time 10    Period Weeks    Status On-going    Target Date 06/24/20      PT LONG TERM GOAL #2   Title Patient will score at or below 65/300  on the PFDI and 35% on the Female NIH-CPSI to demonstrate a clinically meaningful decrease in disability and distress due to pelvic floor dysfunction.    Baseline PFDI: 110/300, Female NIH-CPSI: 29/43 (67%) As of 4/29: 100/300 on PFDI and 30/43 on NIH-CPSI As of 7/6:  NIH-CPSI: 32/43 POPDI: 61/100, CRADI-8: 29/100, UDI: 44/100 (PFDI: 149/300), As of 04/15/20: Female NIH-CPSI: 29/43, PFDI: 84/300    Time 10    Period Weeks    Status On-going    Target Date 06/24/20      PT LONG TERM GOAL #3   Title Patient will report no pain with intercourse to demonstrate improved functional ability.    Baseline Pt. Having significant pain with intercourse. As of 4/29: is a little better (20-30%) but is worse on days where overall tension/pain is worse. As of 6/10: can have days where there is not much pain but other days where there is significant pain. As of 11/9: same as previous update. AS of 04/15/20: 25% improved from base-line    Time 10    Period Weeks    Status On-going    Target Date 06/24/20      PT LONG TERM GOAL #4   Title Pt will report ability to work in  her yard for greater than 30 minutes without extreme fatigue    Baseline limited to 10-15 minutes before pt requires ending activity. As of 4/29: ~ 20 min. As of 6/10: has not been gardening much but can do ~ 30 min of similar indoor activity level before increased fatigue.    Time 10    Period Weeks    Status Achieved    Target Date 10/23/19      PT LONG TERM GOAL #5   Title Pt. will be able to go 2-3 hours between emptying her bladder without increased pain and empty her bladder fully whether by urostomy, cath, or voluntary release.    Baseline Pt. has a urostomy but continues to have to self-catheterize. Has pain with bladder filling and when using catheter. As of 4/29: is doing better since starting to take the pregabalin andd baclofen but occasionally still has pain with bladder filling. It is bad when she has an infection or over-does activity, better on "normal" days which have been few and far between due to frequent UTI's. As of 6/10: Pt. has "good" and "bad" days but has more days where she can go 2-3 hours without increased pain. As of 04/15/20: able to wait 2 hours, not 3 yet    Time 10    Period Weeks    Status On-going    Target Date 06/24/20  PT LONG TERM GOAL #6   Title Patient will report having BM's at least every-other day with consistency between [facility] stool scale 3-5 over the prior week to demonstrate decreased constipation.    Baseline Pt. unable to have regula BM's without medication, manometry shows PFM dysfunction. As of 4/29: still having to use and enema or supository occasionally and still needing linzess. As of 6/10: Pt. has days where she can empty ok and days where she needs to use an Enema to stimulate a BM, feels that it is just the insertion of the enema, not the solution that does the job. As of 11/9: Pt. not having constipation due to colonic infection that has shifted her toward diarrhea. Having BM's daily, sometimes type 5. Has had diarrhea more than  constipation, still had to take linzess 1 time to go after 2 days without a BM.    Time 10    Period Weeks    Status On-going    Target Date 06/24/20                 Plan - 04/15/20 1153    Clinical Impression Statement Pt. has started to demonstrated improvement again in her overall pain and demonstrated a large improvement in her PFDI score today as well as a decrease in average pain intensity. While she was able to go ~ 2-3 weeks with decreased bladder spasms and improved smelling urine/ decreased UTI Sx. she is mildly concerned that her UTI issues are creeping back up as her urine has been smelling worse and she has had a few more bladder spasms and white bladder film sloughing in her urosomy bag (though still less than before she started seeing urogynecology). We have been working on using intentional rest breaks and the patient is starting to recognize the difference in her Sx. when she does implement this technique appropriately but has a hard time with doing so consistently, especially over the holidays. Her average pain has come down from 7/10 to around 5/10 over the last couple months and now that the holiday's are over we hope to be able to build on this success with more consistency. Today she is having increased weakness/feeling of instability on the LLE and it may be from her L up-slip but as we were not ale to fully correct for it today, we will have to see if it improves spontaneously or only after we are able to get the R hip-flexors to let go. She will continue to benefit from skilled pelvic health PT to continue building on the progress made and help prevent further loss of function while she continues to try to understand what is happening neurologically and what can be done or what the long-run expectations are for function. We will continue at 2x/week for 10 more weeks.    Personal Factors and Comorbidities Comorbidity 3+    Comorbidities neurologic insult with pending  diagnosis, multiple recurrent UTI's, BLE weakness, depression    Examination-Activity Limitations Caring for Others;Carry;Continence;Squat;Stairs;Toileting;Locomotion Level;Lift;Stand    Examination-Participation Restrictions Church;Cleaning;Community Activity;Driving;Interpersonal Relationship;Laundry;Yard Work;Meal Prep    Stability/Clinical Decision Making Unstable/Unpredictable    Clinical Decision Making High    Rehab Potential Fair    PT Frequency 2x / week    PT Duration Other (comment)    PT Treatment/Interventions ADLs/Self Care Home Management;Aquatic Therapy;Biofeedback;Moist Heat;Electrical Stimulation;Cryotherapy;Traction;Ultrasound;Therapeutic activities;Functional mobility training;Stair training;Gait training;Therapeutic exercise;Balance training;Neuromuscular re-education;Patient/family education;Manual techniques;Dry needling;Scar mobilization;Energy conservation;Taping;Joint Manipulations;Spinal Manipulations;Visual/perceptual remediation/compensation;Passive range of motion    PT Next Visit Plan Ask about  taping/re-apply, further suprapubic scar work, deep-core, scapular tracking/strength, and balance, MFR to lower abdomen, Review pain diary, cupping to medial thighs and upper back, re-assess neural glides on LLE, focus on graded activity, and seated and standing balance/core strength. review I's Y's and T's in prone, thoracic mobility? perform further release and PNF for R>L shoulder blades, more scar-release and cupping at suprapubic scar, MFR around B ilium. continue to work on thoracolumbar rotation ROM and decreasing spasms, add SLS balance training, .    PT Home Exercise Plan A day: low back stretch, side-stretch, hamstring stretch, butterfly stretch, hip EXT in prone with knee bent, side-lying hip ABD, calf raises. B day: Chest-stretch, Thoracic extensions over a towel roll, seated rows and seated scapular retraction (pull shoulder blades together like "T's") with band, child's  pose stretch, self internal TP release (especially when Pelvic floor spasms high), Every day: Pain diary, Posterior pelvic tilts with deep core activation and red band for obliques activation, bow-and-arrow, TENS unit 3+ times per day for 30 min and as needed if pain starts to increase. TENS at ASIS/PSIS at strong tingle for 1 hr. per day. Sleeper stretch PRN, vagal nerve toning    Consulted and Agree with Plan of Care Patient           Patient will benefit from skilled therapeutic intervention in order to improve the following deficits and impairments:  Abnormal gait,Decreased balance,Decreased endurance,Decreased mobility,Difficulty walking,Increased muscle spasms,Impaired sensation,Improper body mechanics,Impaired tone,Decreased activity tolerance,Decreased coordination,Decreased strength,Postural dysfunction,Pain  Visit Diagnosis: Other muscle spasm  Muscle weakness (generalized)  Difficulty in walking, not elsewhere classified     Problem List Patient Active Problem List   Diagnosis Date Noted  . Seizure ([place]) 11/11/2018  . Major depressive disorder, recurrent episode, moderate ([place]) 02/07/2018  . Nephrolithiasis 04/16/2016  . Numbness 07/28/2015  . Bladder retention 06/23/2015  . Abdominal pain 06/04/2015  . Dizziness 05/18/2015  . Neck pain 05/18/2015  . Complicated migraine 17/51/0258  . Other fatigue 04/28/2015  . Abnormal finding on MRI of brain 04/28/2015  . D (diarrhea) 03/29/2015  . H/O disease 03/29/2015  . Abnormal weight loss 03/29/2015  . Muscle weakness (generalized) 03/14/2015  . Headache, migraine 03/10/2015   Willa Rough DPT, ATC Willa Rough 04/15/2020, 1:01 PM  [place] MAIN [facility] SERVICES [address] [place], [place], [address] Phone: [phone]   Fax:  [phone]  Name: Efrata Brunner MRN: [id number] Date of Birth: 09/10/1980

## 2020-04-19 ENCOUNTER — Ambulatory Visit: Payer: BC Managed Care – PPO

## 2020-04-22 ENCOUNTER — Ambulatory Visit: Payer: BC Managed Care – PPO | Attending: Gastroenterology

## 2020-04-22 ENCOUNTER — Other Ambulatory Visit: Payer: Self-pay

## 2020-04-22 DIAGNOSIS — M62838 Other muscle spasm: Secondary | ICD-10-CM | POA: Diagnosis present

## 2020-04-22 DIAGNOSIS — R262 Difficulty in walking, not elsewhere classified: Secondary | ICD-10-CM | POA: Insufficient documentation

## 2020-04-22 DIAGNOSIS — M6281 Muscle weakness (generalized): Secondary | ICD-10-CM | POA: Insufficient documentation

## 2020-04-22 NOTE — Therapy (Addendum)
[facility] MAIN [facility] SERVICES [address] [place], [place], [address] Phone: [phone]   Fax:  [phone]  Physical Therapy Treatment  The patient has been informed of current processes in place at Outpatient Rehab to protect patients from Covid-19 exposure including social distancing, schedule modifications, and new cleaning procedures. After discussing their particular risk with a therapist based on the patient's personal risk factors, the patient has decided to proceed with in-person therapy.  Patient Details  Name: Christine Chavez MRN: [id number] Date of Birth: Nov 10, 1980 No data recorded  Encounter Date: 04/22/2020   PT End of Session - 04/22/20 1150    Visit Number 80    Number of Visits 98    Date for PT Re-Evaluation 05/04/20    Authorization Type [employer]    Authorization Time Period from 02/24/20 through 05/04/20    Authorization - Visit Number 2    Authorization - Number of Visits 20    Progress Note Due on Visit 98    PT Start Time 1018    PT Stop Time 1130    PT Time Calculation (min) 72 min    Activity Tolerance Patient tolerated treatment well;No increased pain    Behavior During Therapy WFL for tasks assessed/performed           Past Medical History:  Diagnosis Date   Complication of anesthesia    ? seizures after anesthesia    Headache    Migraines    Neurogenic bladder    Renal disorder    Vision abnormalities     Past Surgical History:  Procedure Laterality Date   ANTERIOR CRUCIATE LIGAMENT REPAIR  1997   APPENDECTOMY     COLONOSCOPY WITH PROPOFOL N/A 11/11/2018   Procedure: COLONOSCOPY WITH PROPOFOL;  Surgeon: Christena Deem, MD;  Location: [facility] ENDOSCOPY;  Service: Endoscopy;  Laterality: N/A;   CYSTOSCOPY WITH STENT PLACEMENT Right 04/17/2016   Procedure: CYSTOSCOPY WITH STENT PLACEMENT;  Surgeon: Malen Gauze, MD;  Location: [facility] ORS;  Service: Urology;  Laterality: Right;    ESOPHAGOGASTRODUODENOSCOPY (EGD) WITH PROPOFOL N/A 11/11/2018   Procedure: ESOPHAGOGASTRODUODENOSCOPY (EGD) WITH PROPOFOL;  Surgeon: Christena Deem, MD;  Location: [facility] ENDOSCOPY;  Service: Endoscopy;  Laterality: N/A;   EXPLORATORY LAPAROTOMY  1999   KIDNEY STONE SURGERY  04/2016   REVISION UROSTOMY CUTANEOUS     REVISION UROSTOMY CUTANEOUS  01/10/2018   SUPRAPUBIC CATHETER PLACEMENT  08/2017   TONSILLECTOMY      There were no vitals filed for this visit.  Pelvic Floor Physical Therapy Treatment Note  SCREENING  Changes in medications, allergies, or medical history?: none  SUBJECTIVE  Patient reports: She is in a rough spot today, her back hurt really bad last night (8/10) and had a bad migraine on Saturday and on Tuesday. She had to take her secondary migraine medicine which helped some. She is at ~ 6/10 in her bladder this morning. She has an appointment with the pain clinic on Monday and her new Primary care on Friday. She is wondering if the ablation is wearing off. She was at a birthday party that had bright lights with the headache which irritated it. Her legs hurt the other day after her visit when she was having the hard time controlling the motion. Her father has Covid, this has increased her stress. She fell for the first time this week. Did not land hard and was able to get up on her own (though difficult).   Retrospective: Early  on she had more like "episodes" and would have "full blown tremors" and slurred speech"  "when I get nervous it gets worse" She can tell when she is with a helper that she does not trust to catch her that she has a harder time getting into the house.   She felt fatigued the second time we did the walk test but she was also more nervous that she might not be able to perform because she already felt tired.  Precautions:  Neurological diagnosis pending, has "non-specific" brain lesions   Pain update:  Location of pain: LB and  neck/shoulders (bladder) Current pain:  5/10) (4/10) Max pain: 8/10 (6/10 when it spasms) Least pain:  2/10 (2/10) Nature of pain: sharp to dull ache (grabbing)  **no increased pain following treatment.  Patient Goals: Get rid of the discomfort with catheterizing and ache/bladder pain. Be able to take fewer medications and be able to enjoy spending time with her family again, doing "all the mom things"    OBJECTIVE  Changes in:  Observations/Posture:  L lumbar curve prominent with transverse processes more prominent on the L. (from previous note)  C3 spinous process deviated L through C2-3. (from previous note)  12/30: L up-slip and anterior rotation **following treatment L iliacus remained tight, not allowing for complete up-slip or rotation correction.   Range of Motion/Flexibilty:  (from 03/27/19) Spine: ~ 2-3 fingers from knee with SB B. ~ 50% reduced R rotation with stretch in L, ~ 75% reduced L rotation with increased tightness. HS: able to achieve 90 deg. Hip flexion in seated HS stretch. (from prior treatment)  Decreased PIVM through Thoracic, lumbar, and sacral spine with greatest restriction at T3-6. (from prior session)  12/30:  no decreased mobility at the sacrum with attempt at manual correction for L anterior rotation, hip-flexor spasm keeping Pt. From approp  Strength/MMT:  LE MMT (from 03-27-19) LE MMT Left Right  Hip flex:  (L2) /5 /5  Hip ext: 3+/5 3+/5  Hip abd: 4/5 4/5  Hip add: 4/5 3+/5  Hip IR 4/5 3+/5  Hip ER 4/5 4/5   (From 3/24): Pt. Demonstrates 3/5 strength with "I's, Y's, and T's" but 4/5 with W's. (from 6/22) DF: 9 reps with B. Eversion/Inversion: 4/5 on R, 4+/5 on L. DF: L 4+/5, R 3+/5  Seated IR/ER on L: 4/5, R 3+/5 (from 8-5) L DF and Eversion 4+/5 with first 1-3 reps and fatigue decreased to 4/4. (9/9) Pt. Demonstrates ~ 4+/5 B with increased spasticity B.   Balance/stability:  (6-29) Pt. Able to achieve good posture without cueing  but only able to maintain for ~ 2.5 min. Before seeking stability from the table and forward rolled shoulders. With cue to engage the deep core, Pt. Recognizes increased stability and comfort.  8/5: Pt. Demonstrating decreased stability in gait and standing balance today with fasciculations increasing in direct relation to muscular endurance required. Able to stand ~ 10 sec. In SLS with MIN UE support before intensity of spasms make attempt at balance futile.   12/30: Pt. Reports feeling less stable on LLE again, similar to August but less intense.    Pelvic Floor External Exam: (From prior session)- [Introitus Appears: elevated Skin integrity: WNL Palpation: TTP to B STP Cough: limited motion Prolapse visible?: no Scar mobility: not assessed  Internal Vaginal Exam: Strength (PERF): Pt. Has difficulty sensing the PFM to generate contraction or relaxation due to high tone/neuro involvement Symmetry: L>R for spasm/TTP Palpation: TTP to all muscles throughout  B Prolapse: none]  unable to coordinate squeeze and TTP to all areas with the greatest at B OI and coccygeus. ** following TP release Pt. Able to achieve 1/5 PFM contraction. Decreased TTP at B anterior PR/PC following treatment. (10/14).  TODAY: not re-assessed today.  Abdominal:  Pt. Demonstrated ability to recruit R>L obliques and TA with pelvic tilts. (from prior session)  Palpation: Decreased fascial and scar mobility through lower abdomen with radiating Sx. To the pelvis and LB. (from prior session)  12/30: TTP to L QL and Iliacus  Gait Analysis:  2 min. Walk test: 12 meters on 1st attempt. 7.75 meters on 2nd attempt. (video)  Pt. Demonstrated crouched walk with difficulty controlling where she placed the R>L LE and ~ 3-4 near LOB while walking on 2nd attempt with PT at [facility] and using Rollator  (Pt. Feels like this would have been better 2-3 days ago even)  INTERVENTIONS THIS SESSION:  Self-care: Discussed  upcoming appointments with pain management and primary care physician and encouraged her to discuss with them the potential of a FND diagnosis and potential to do ablation to both pudendal and hypogastric nerves at the same time if indicated/se feels it would be beneficial. Reviewed the course of her disability and various symptoms over time that have not been explained. Theract: Performed PSFS outcome measure and the 2 min. Walk test and discussed and set new goals around increasing function regardless of diagnosis to indirectly affect pain and bladder by increasing general strength and mobility and diverting focus away from deficits.    Total time: 60 min.                               PT Short Term Goals - 04/15/20 1044      PT SHORT TERM GOAL #1   Title Patient will demonstrate coordinated diaphragmatic breathing with pelvic tilts to demonstrate improved control of diaphragm and TA, to allow for further strengthening of core musculature and decreased pelvic floor spasm.    Baseline Pt. demonstrates breathing dysfunction and poor PFM coordination evidenced by anal manometry As of 2/22: Pt. continues to have restriction in her diaphragm and near T/L junction but is able to intentionally use diaphragmatic breathing through the available ROM.    Time 5    Period Weeks    Status Achieved    Target Date 04/20/19      PT SHORT TERM GOAL #2   Title Patient will report a reduction in pain to no greater than 6/10 over the prior week to demonstrate symptom improvement.    Baseline Pain is 10/10 at worst, 2/10 at best As of 2/22: Pt. had achieved this goal for 1 week as of 1/21 but may have infection or other insult that caused pain to increase again. As of 4/29: Pain high of 8/10 but has been over-doing her activity over the past week. As of 6/10: Pt. was able to keep pain at 6 or below since prior visit by using TENS to help keep tension lower but has not had a full week to  test if it will keep working. As of 7/6: Pt. had met this goal but has a new UTI and it caused pain to spike to 8/10 over the weekend. As of 11/9: Pt. has just finally made progress with long-standing UTI and new POC for treatment so she is still having pain spikes up to 8/10 but we expect greater improvement  over the next few months. As of 12/30: still feels like she is trending in a better direction but over-did it over the holidays and pain spiked to 8/10. No higher than 6/10 over last 2 days, even with PMS cramping.    Time 5    Period Weeks    Status On-going    Target Date 06/24/20      PT SHORT TERM GOAL #3   Title Patient will demonstrate HEP x1 in the clinic to demonstrate understanding and proper form to allow for further improvement.    Baseline Pt. lacks knowledge of therepeutic exercises that can decrease her pain/Sx.    Time 5    Period Weeks    Status Achieved    Target Date 04/20/19      PT SHORT TERM GOAL #4   Title Patient will report consistent use of foot-stool (squatty-potty) for positioning with BM to decrease pain with BM and intra-abdominal pressure.    Baseline Pt. having constipation due to PFM dysfunction    Time 5    Period Weeks    Status Achieved    Target Date 04/20/19             PT Long Term Goals - 04/23/20 0001      PT LONG TERM GOAL #1   Title Pt. will be able to participate in regular ADL's with least restrictive device without pain increasing greater than 2/10    Baseline Pt. limited in her ability to perform household duties by increased pain and fatigue. As of 4/29: Her legs have more endurance with walking before she gets a tremor. Still needs to sit down the majority of the time she is cooking. Still needs back support for prolonged sitting. As of 6/10: Pt. able to do for  ~1 hour before she needs to rest to prevent increased pain, pain is averaging ~ 4-5/10    Time 10    Period Weeks    Status On-going    Target Date 06/24/20      PT  LONG TERM GOAL #2   Title Patient will score at or below 65/300  on the PFDI and 35% on the Female NIH-CPSI to demonstrate a clinically meaningful decrease in disability and distress due to pelvic floor dysfunction.    Baseline PFDI: 110/300, Female NIH-CPSI: 29/43 (67%) As of 4/29: 100/300 on PFDI and 30/43 on NIH-CPSI As of 7/6:  NIH-CPSI: 32/43 POPDI: 61/100, CRADI-8: 29/100, UDI: 44/100 (PFDI: 149/300), As of 04/15/20: Female NIH-CPSI: 29/43, PFDI: 84/300    Time 10    Period Weeks    Status On-going    Target Date 06/24/20      PT LONG TERM GOAL #3   Title Patient will report no pain with intercourse to demonstrate improved functional ability.    Baseline Pt. Having significant pain with intercourse. As of 4/29: is a little better (20-30%) but is worse on days where overall tension/pain is worse. As of 6/10: can have days where there is not much pain but other days where there is significant pain. As of 11/9: same as previous update. AS of 04/15/20: 25% improved from base-line    Time 10    Period Weeks    Status On-going    Target Date 06/24/20      PT LONG TERM GOAL #4   Title Pt will report ability to work in her yard for greater than 30 minutes without extreme fatigue    Baseline limited to 10-15 minutes  before pt requires ending activity. As of 4/29: ~ 20 min. As of 6/10: has not been gardening much but can do ~ 30 min of similar indoor activity level before increased fatigue.    Time 10    Period Weeks    Status Achieved    Target Date 10/23/19      PT LONG TERM GOAL #5   Title Pt. will be able to go 2-3 hours between emptying her bladder without increased pain and empty her bladder fully whether by urostomy, cath, or voluntary release.    Baseline Pt. has a urostomy but continues to have to self-catheterize. Has pain with bladder filling and when using catheter. As of 4/29: is doing better since starting to take the pregabalin andd baclofen but occasionally still has pain with  bladder filling. It is bad when she has an infection or over-does activity, better on "normal" days which have been few and far between due to frequent UTI's. As of 6/10: Pt. has "good" and "bad" days but has more days where she can go 2-3 hours without increased pain. As of 04/15/20: able to wait 2 hours, not 3 yet    Time 10    Period Weeks    Status On-going    Target Date 06/24/20      PT LONG TERM GOAL #6   Title Patient will report having BMs at least every-other day with consistency between [facility] stool scale 3-5 over the prior week to demonstrate decreased constipation.    Baseline Pt. unable to have regula BM's without medication, manometry shows PFM dysfunction. As of 4/29: still having to use and enema or supository occasionally and still needing linzess. As of 6/10: Pt. has days where she can empty ok and days where she needs to use an Enema to stimulate a BM, feels that it is just the insertion of the enema, not the solution that does the job. As of 11/9: Pt. not having constipation due to colonic infection that has shifted her toward diarrhea. Having BM's daily, sometimes type 5. Has had diarrhea more than constipation, still had to take linzess 1 time to go after 2 days without a BM.    Time 10    Period Weeks    Status On-going    Target Date 06/24/20      PT LONG TERM GOAL #7   Title Pt. will drive to a meaningful activity to demonstrate increased function and participation    Baseline Pt. has not been able to drive due to decreased LE control    Time 10    Period Weeks    Status New    Target Date 06/24/20      PT LONG TERM GOAL #8   Title Pt. will be able to perform 1 hr. of light housework reqiring intermittent standing and walking without increased pain/fatigue >2 pts.    Baseline Pt. becomes fatigued washing fruits/vegetbles in small stages and has fatigue>3 pts.    Time 10    Period Weeks    Status New    Target Date 06/24/20      PT LONG TERM GOAL  #9   TITLE  Pt. will be able to attend a full athletic event for her children without Sx increasing by more than 2 pts.    Baseline Pt. having migraine or severe bladder spasms and fatigue and back pain> 3 pt. increase with attendance at athletic events    Time 20    Period Weeks  Status New    Target Date 09/10/20                 Plan - 04/22/20 1150    Clinical Impression Statement Pt. presents today with worsening back and bladder pain since prior visit as well as increased migraines and fatigue. Based upon continuing difficulty to manage symptoms with traditional interventions, we decided to explore the possibility that the patient is presenting with a functional neurological disorder that may exist in tandem with another nueurological disfunction or independently. In order to explore this line of thought we set new baselines today that are related to function rather than symptoms, including a 2 min. walk test and the PSFS. We also discussed the myriad of Sx. that she has experienced since her lumbar puncture initial insult in 2017 including breif slurred speech and UE weakness, intermittent vision changes, LBP, voiding difficulty, consistent BLE weakness, gait disorder, and excessive fatigue. Pt. did note that she notices her Symptoms get worse when she is nervous or anxious. Regardless of diagnosis, we will begin to focus more on increasing function rather than decreasing symptoms as Pt. seems to be stuck in a loop of only having decreased pain shortly after antibiotic use and this is not sustainable. The hope was that with Uqora and other supplement use, the bacteria might be better managed but this does not appear to be sufficient at this time. We discussed what has worked so far, including the hypogastric nerve block and discussed how much effect was noted with the pudendal nerve block and suggested she ask her pain medicine specialist if they think it would be beneficial or appropriate to do both  pudendal and hypogastric at the same time. She responded well to this discussion and has been asked to do more reading on her own and discuss this possible diagnosis with her team to see if it may be an explination for her symptoms. She will continue to benefit from skilled pelvic health PT To work on increasing function while also implementing exercises designed to maximize PFM relaxation to minimize spasm and pain.    Personal Factors and Comorbidities Comorbidity 3+    Comorbidities neurologic insult with pending diagnosis, multiple recurrent UTI's, BLE weakness, depression    Examination-Activity Limitations Caring for Others;Carry;Continence;Squat;Stairs;Toileting;Locomotion Level;Lift;Stand    Examination-Participation Restrictions Church;Cleaning;Community Activity;Driving;Interpersonal Relationship;Laundry;Yard Work;Meal Prep    Stability/Clinical Decision Making Unstable/Unpredictable    Clinical Decision Making High    Rehab Potential Fair    PT Frequency 2x / week    PT Duration Other (comment)    PT Treatment/Interventions ADLs/Self Care Home Management;Aquatic Therapy;Biofeedback;Moist Heat;Electrical Stimulation;Cryotherapy;Traction;Ultrasound;Therapeutic activities;Functional mobility training;Stair training;Gait training;Therapeutic exercise;Balance training;Neuromuscular re-education;Patient/family education;Manual techniques;Dry needling;Scar mobilization;Energy conservation;Taping;Joint Manipulations;Spinal Manipulations;Visual/perceptual remediation/compensation;Passive range of motion    PT Next Visit Plan Follow up on driving progress, try walking with external focus. Ask about taping/re-apply, further suprapubic scar work, deep-core, scapular tracking/strength, and balance, MFR to lower abdomen, Review pain diary, cupping to medial thighs and upper back, re-assess neural glides on LLE, focus on graded activity, and seated and standing balance/core strength. review I's Y's and T's in  prone, thoracic mobility? perform further release and PNF for R>L shoulder blades, more scar-release and cupping at suprapubic scar, MFR around B ilium. continue to work on thoracolumbar rotation ROM and decreasing spasms, add SLS balance training, .    PT Home Exercise Plan A day: low back stretch, side-stretch, hamstring stretch, butterfly stretch, hip EXT in prone with knee bent, side-lying hip ABD, calf raises.  B day: Chest-stretch, Thoracic extensions over a towel roll, seated rows and seated scapular retraction (pull shoulder blades together like "T's") with band, child's pose stretch, self internal TP release (especially when Pelvic floor spasms high), Every day: Pain diary, Posterior pelvic tilts with deep core activation and red band for obliques activation, bow-and-arrow, TENS unit 3+ times per day for 30 min and as needed if pain starts to increase. TENS at ASIS/PSIS at strong tingle for 1 hr. per day. Sleeper stretch PRN, vagal nerve toning    Consulted and Agree with Plan of Care Patient           Patient will benefit from skilled therapeutic intervention in order to improve the following deficits and impairments:  Abnormal gait,Decreased balance,Decreased endurance,Decreased mobility,Difficulty walking,Increased muscle spasms,Impaired sensation,Improper body mechanics,Impaired tone,Decreased activity tolerance,Decreased coordination,Decreased strength,Postural dysfunction,Pain  Visit Diagnosis: Other muscle spasm  Muscle weakness (generalized)  Difficulty in walking, not elsewhere classified     Problem List Patient Active Problem List   Diagnosis Date Noted   Seizure ([place]) 11/11/2018   Major depressive disorder, recurrent episode, moderate ([place]) 02/07/2018   Nephrolithiasis 04/16/2016   Numbness 07/28/2015   Bladder retention 06/23/2015   Abdominal pain 06/04/2015   Dizziness 05/18/2015   Neck pain 92/33/0076   Complicated migraine 22/63/3354   Other fatigue  04/28/2015   Abnormal finding on MRI of brain 04/28/2015   D (diarrhea) 03/29/2015   H/O disease 03/29/2015   Abnormal weight loss 03/29/2015   Muscle weakness (generalized) 03/14/2015   Headache, migraine 03/10/2015   Willa Rough DPT, ATC Willa Rough 04/23/2020, 8:07 AM  [place] MAIN [facility] SERVICES [address] [place], [place], [address] Phone: [phone]   Fax:  [phone]  Name: Christine Chavez MRN: [id number] Date of Birth: [birth year]

## 2020-04-26 ENCOUNTER — Ambulatory Visit: Payer: BC Managed Care – PPO

## 2020-04-29 ENCOUNTER — Ambulatory Visit: Payer: BC Managed Care – PPO

## 2020-05-03 ENCOUNTER — Ambulatory Visit: Payer: BC Managed Care – PPO

## 2020-05-06 ENCOUNTER — Ambulatory Visit: Payer: BC Managed Care – PPO

## 2020-05-10 ENCOUNTER — Ambulatory Visit: Payer: BC Managed Care – PPO

## 2020-05-13 ENCOUNTER — Ambulatory Visit: Payer: BC Managed Care – PPO

## 2020-05-13 ENCOUNTER — Other Ambulatory Visit: Payer: Self-pay

## 2020-05-13 DIAGNOSIS — M6281 Muscle weakness (generalized): Secondary | ICD-10-CM

## 2020-05-13 DIAGNOSIS — M62838 Other muscle spasm: Secondary | ICD-10-CM | POA: Diagnosis not present

## 2020-05-13 DIAGNOSIS — R262 Difficulty in walking, not elsewhere classified: Secondary | ICD-10-CM

## 2020-05-13 NOTE — Therapy (Addendum)
[place] MAIN [facility] SERVICES [address] [place], [place], [address] Phone: [phone]   Fax:  [phone]  Physical Therapy Treatment  The patient has been informed of current processes in place at Outpatient Rehab to protect patients from Covid-19 exposure including social distancing, schedule modifications, and new cleaning procedures. After discussing their particular risk with a therapist based on the patient's personal risk factors, the patient has decided to proceed with in-person therapy.   Patient Details  Name: Christine Chavez MRN: [id number] Date of Birth: Mar 12, 1981 No data recorded  Encounter Date: 05/13/2020   PT End of Session - 05/15/20 1724    Visit Number 81    Number of Visits 26    Date for PT Re-Evaluation 05/04/20    Authorization Type [employer]    Authorization Time Period from 02/24/20 through 05/04/20    Authorization - Visit Number 3    Authorization - Number of Visits 20    Progress Note Due on Visit 98    PT Start Time 1030    PT Stop Time 1130    PT Time Calculation (min) 60 min    Activity Tolerance Patient tolerated treatment well;No increased pain    Behavior During Therapy WFL for tasks assessed/performed           Past Medical History:  Diagnosis Date  . Complication of anesthesia    ? seizures after anesthesia   . Headache   . Migraines   . Neurogenic bladder   . Renal disorder   . Vision abnormalities     Past Surgical History:  Procedure Laterality Date  . ANTERIOR CRUCIATE LIGAMENT REPAIR  1997  . APPENDECTOMY    . COLONOSCOPY WITH PROPOFOL N/A 11/11/2018   Procedure: COLONOSCOPY WITH PROPOFOL;  Surgeon: [name], MD;  Location: [facility] ENDOSCOPY;  Service: Endoscopy;  Laterality: N/A;  . CYSTOSCOPY WITH STENT PLACEMENT Right 04/17/2016   Procedure: CYSTOSCOPY WITH STENT PLACEMENT;  Surgeon: [name], MD;  Location: [facility] ORS;  Service: Urology;  Laterality: Right;  .  ESOPHAGOGASTRODUODENOSCOPY (EGD) WITH PROPOFOL N/A 11/11/2018   Procedure: ESOPHAGOGASTRODUODENOSCOPY (EGD) WITH PROPOFOL;  Surgeon: [name], MD;  Location: [facility] ENDOSCOPY;  Service: Endoscopy;  Laterality: N/A;  . EXPLORATORY LAPAROTOMY  1999  . KIDNEY STONE SURGERY  04/2016  . REVISION UROSTOMY CUTANEOUS    . REVISION UROSTOMY CUTANEOUS  01/10/2018  . SUPRAPUBIC CATHETER PLACEMENT  08/2017  . TONSILLECTOMY      There were no vitals filed for this visit.   Pelvic Floor Physical Therapy Treatment Note  SCREENING  Changes in medications, allergies, or medical history?: they lowered her pregabalin and baclofen due to her feeling "like a zombie"  SUBJECTIVE  Patient reports: Having some headache and nausea today. Had to take migraine meds this morning. Had labs done to see if she had a UTI and thinks that they show that it is positive but she has not been able to talk to her MD yet about it. Sees the Urogynecologist on Monday. She got to have a good long conversation with her new primary care MD and she thinks that it may be worth treating it like MS even though it has not been diagnosed as such yet. Her neurologist appointment had to be rescheduled so she has not gone yet. Her pain medicine doctor discussed a lot of different options with her. Both MD's think that interstim may be an option for her. Had a terrible headache and a  Fever on Saturday, has not had it since then but has had chills off and on. Had trouble sleeping 3 nights in a row. HA is 7/10   Precautions:  Neurological diagnosis pending, has "non-specific" brain lesions   Pain update:  Location of pain: LB and neck/shoulders (bladder) Current pain:  5/10) (5/10) Max pain: 8/10 (6/10 when it spasms) Least pain:  2/10 (2/10) Nature of pain: sharp to dull ache (grabbing)  **no increased pain following treatment.  Patient Goals: Get rid of the discomfort with catheterizing and ache/bladder pain. Be able to take  fewer medications and be able to enjoy spending time with her family again, doing "all the mom things"    OBJECTIVE  Changes in:  Observations/Posture:  L lumbar curve prominent with transverse processes more prominent on the L. (from previous note)  C3 spinous process deviated L through C2-3. (from previous note)  12/30: L up-slip and anterior rotation **following treatment L iliacus remained tight, not allowing for complete up-slip or rotation correction.   Range of Motion/Flexibilty:  (from 03/27/19) Spine: ~ 2-3 fingers from knee with SB B. ~ 50% reduced R rotation with stretch in L, ~ 75% reduced L rotation with increased tightness. HS: able to achieve 90 deg. Hip flexion in seated HS stretch. (from prior treatment)  Decreased PIVM through Thoracic, lumbar, and sacral spine with greatest restriction at T3-6. (from prior session)  12/30:  no decreased mobility at the sacrum with attempt at manual correction for L anterior rotation, hip-flexor spasm keeping Pt. From approp  Strength/MMT:  LE MMT (from 03-27-19) LE MMT Left Right  Hip flex:  (L2) /5 /5  Hip ext: 3+/5 3+/5  Hip abd: 4/5 4/5  Hip add: 4/5 3+/5  Hip IR 4/5 3+/5  Hip ER 4/5 4/5   (From 3/24): Pt. Demonstrates 3/5 strength with "I's, Y's, and T's" but 4/5 with W's. (from 6/22) DF: 9 reps with B. Eversion/Inversion: 4/5 on R, 4+/5 on L. DF: L 4+/5, R 3+/5  Seated IR/ER on L: 4/5, R 3+/5 (from 8-5) L DF and Eversion 4+/5 with first 1-3 reps and fatigue decreased to 4/4. (9/9) Pt. Demonstrates ~ 4+/5 B with increased spasticity B.   Balance/stability:  (6-29) Pt. Able to achieve good posture without cueing but only able to maintain for ~ 2.5 min. Before seeking stability from the table and forward rolled shoulders. With cue to engage the deep core, Pt. Recognizes increased stability and comfort.  8/5: Pt. Demonstrating decreased stability in gait and standing balance today with fasciculations increasing in  direct relation to muscular endurance required. Able to stand ~ 10 sec. In SLS with MIN UE support before intensity of spasms make attempt at balance futile.   12/30: Pt. Reports feeling less stable on LLE again, similar to August but less intense.    Pelvic Floor External Exam: (From prior session)- [Introitus Appears: elevated Skin integrity: WNL Palpation: TTP to B STP Cough: limited motion Prolapse visible?: no Scar mobility: not assessed  Internal Vaginal Exam: Strength (PERF): Pt. Has difficulty sensing the PFM to generate contraction or relaxation due to high tone/neuro involvement Symmetry: L>R for spasm/TTP Palpation: TTP to all muscles throughout B Prolapse: none]  unable to coordinate squeeze and TTP to all areas with the greatest at B OI and coccygeus. ** following TP release Pt. Able to achieve 1/5 PFM contraction. Decreased TTP at B anterior PR/PC following treatment. (10/14).  TODAY: not re-assessed today.  Abdominal:  Pt. Demonstrated ability to recruit  R>L obliques and TA with pelvic tilts. (from prior session)  Palpation: Decreased fascial and scar mobility through lower abdomen with radiating Sx. To the pelvis and LB. (from prior session)  12/30: TTP to L QL and Iliacus  Gait Analysis:  (04/22/20) 2 min. Walk test: 12 meters on 1st attempt. 7.75 meters on 2nd attempt. (video)  Pt. Demonstrated crouched walk with difficulty controlling where she placed the R>L LE and ~ 3-4 near LOB while walking on 2nd attempt with PT at [facility] and using Rollator  (Pt. Feels like this would have been better 2-3 days ago even)  TODAY: Pt. Walked ~ 50 ft. x2 With rollator and gait belt. Attempted to walk to metronome cadence but with difficulty, ended up keeping to ~ 1/2 time of the tempo with ~ 50% consistency. Some difficulty with control noted but less than during initial walk test.  INTERVENTIONS THIS SESSION:  Self-care: Discussed recent doctor's appointments and likely  UTI returning as well as the plan to begin having her see Lenda Kelp for one of her 2 visits each week to allow for more functional strengthening with a stronger neurological lens.  Gait training: Performed walking 2x50 ft. With ~ 3 min. Rest break and VC and TC to stand tall and look forward as well as metronome to give external cue.  Manual: Performed TP release to  sub-occipitals and cervical extensors on L to decrease spasm and HA pain and allow for improved balance of musculature for improved function and decreased symptoms.    Total time: 60 min.                              PT Short Term Goals - 04/15/20 1044      PT SHORT TERM GOAL #1   Title Patient will demonstrate coordinated diaphragmatic breathing with pelvic tilts to demonstrate improved control of diaphragm and TA, to allow for further strengthening of core musculature and decreased pelvic floor spasm.    Baseline Pt. demonstrates breathing dysfunction and poor PFM coordination evidenced by anal manometry As of 2/22: Pt. continues to have restriction in her diaphragm and near T/L junction but is able to intentionally use diaphragmatic breathing through the available ROM.    Time 5    Period Weeks    Status Achieved    Target Date 04/20/19      PT SHORT TERM GOAL #2   Title Patient will report a reduction in pain to no greater than 6/10 over the prior week to demonstrate symptom improvement.    Baseline Pain is 10/10 at worst, 2/10 at best As of 2/22: Pt. had achieved this goal for 1 week as of 1/21 but may have infection or other insult that caused pain to increase again. As of 4/29: Pain high of 8/10 but has been over-doing her activity over the past week. As of 6/10: Pt. was able to keep pain at 6 or below since prior visit by using TENS to help keep tension lower but has not had a full week to test if it will keep working. As of 7/6: Pt. had met this goal but has a new UTI and it caused pain to spike to  8/10 over the weekend. As of 11/9: Pt. has just finally made progress with long-standing UTI and new POC for treatment so she is still having pain spikes up to 8/10 but we expect greater improvement over the next few months. As of  12/30: still feels like she is trending in a better direction but over-did it over the holidays and pain spiked to 8/10. No higher than 6/10 over last 2 days, even with PMS cramping.    Time 5    Period Weeks    Status On-going    Target Date 06/24/20      PT SHORT TERM GOAL #3   Title Patient will demonstrate HEP x1 in the clinic to demonstrate understanding and proper form to allow for further improvement.    Baseline Pt. lacks knowledge of therepeutic exercises that can decrease her pain/Sx.    Time 5    Period Weeks    Status Achieved    Target Date 04/20/19      PT SHORT TERM GOAL #4   Title Patient will report consistent use of foot-stool (squatty-potty) for positioning with BM to decrease pain with BM and intra-abdominal pressure.    Baseline Pt. having constipation due to PFM dysfunction    Time 5    Period Weeks    Status Achieved    Target Date 04/20/19             PT Long Term Goals - 04/23/20 0001      PT LONG TERM GOAL #1   Title Pt. will be able to participate in regular ADL's with least restrictive device without pain increasing greater than 2/10    Baseline Pt. limited in her ability to perform household duties by increased pain and fatigue. As of 4/29: Her legs have more endurance with walking before she gets a tremor. Still needs to sit down the majority of the time she is cooking. Still needs back support for prolonged sitting. As of 6/10: Pt. able to do for  ~1 hour before she needs to rest to prevent increased pain, pain is averaging ~ 4-5/10    Time 10    Period Weeks    Status On-going    Target Date 06/24/20      PT LONG TERM GOAL #2   Title Patient will score at or below 65/300  on the PFDI and 35% on the Female NIH-CPSI to  demonstrate a clinically meaningful decrease in disability and distress due to pelvic floor dysfunction.    Baseline PFDI: 110/300, Female NIH-CPSI: 29/43 (67%) As of 4/29: 100/300 on PFDI and 30/43 on NIH-CPSI As of 7/6:  NIH-CPSI: 32/43 POPDI: 61/100, CRADI-8: 29/100, UDI: 44/100 (PFDI: 149/300), As of 04/15/20: Female NIH-CPSI: 29/43, PFDI: 84/300    Time 10    Period Weeks    Status On-going    Target Date 06/24/20      PT LONG TERM GOAL #3   Title Patient will report no pain with intercourse to demonstrate improved functional ability.    Baseline Pt. Having significant pain with intercourse. As of 4/29: is a little better (20-30%) but is worse on days where overall tension/pain is worse. As of 6/10: can have days where there is not much pain but other days where there is significant pain. As of 11/9: same as previous update. AS of 04/15/20: 25% improved from base-line    Time 10    Period Weeks    Status On-going    Target Date 06/24/20      PT LONG TERM GOAL #4   Title Pt will report ability to work in her yard for greater than 30 minutes without extreme fatigue    Baseline limited to 10-15 minutes before pt requires ending activity. As of  4/29: ~ 20 min. As of 6/10: has not been gardening much but can do ~ 30 min of similar indoor activity level before increased fatigue.    Time 10    Period Weeks    Status Achieved    Target Date 10/23/19      PT LONG TERM GOAL #5   Title Pt. will be able to go 2-3 hours between emptying her bladder without increased pain and empty her bladder fully whether by urostomy, cath, or voluntary release.    Baseline Pt. has a urostomy but continues to have to self-catheterize. Has pain with bladder filling and when using catheter. As of 4/29: is doing better since starting to take the pregabalin andd baclofen but occasionally still has pain with bladder filling. It is bad when she has an infection or over-does activity, better on "normal" days which have  been few and far between due to frequent UTI's. As of 6/10: Pt. has "good" and "bad" days but has more days where she can go 2-3 hours without increased pain. As of 04/15/20: able to wait 2 hours, not 3 yet    Time 10    Period Weeks    Status On-going    Target Date 06/24/20      PT LONG TERM GOAL #6   Title Patient will report having BM's at least every-other day with consistency between [place] stool scale 3-5 over the prior week to demonstrate decreased constipation.    Baseline Pt. unable to have regula BM's without medication, manometry shows PFM dysfunction. As of 4/29: still having to use and enema or supository occasionally and still needing linzess. As of 6/10: Pt. has days where she can empty ok and days where she needs to use an Enema to stimulate a BM, feels that it is just the insertion of the enema, not the solution that does the job. As of 11/9: Pt. not having constipation due to colonic infection that has shifted her toward diarrhea. Having BM's daily, sometimes type 5. Has had diarrhea more than constipation, still had to take linzess 1 time to go after 2 days without a BM.    Time 10    Period Weeks    Status On-going    Target Date 06/24/20      PT LONG TERM GOAL #7   Title Pt. will drive to a meaningful activity to demonstrate increased function and participation    Baseline Pt. has not been able to drive due to decreased LE control    Time 10    Period Weeks    Status New    Target Date 06/24/20      PT LONG TERM GOAL #8   Title Pt. will be able to perform 1 hr. of light housework reqiring intermittent standing and walking without increased pain/fatigue >2 pts.    Baseline Pt. becomes fatigued washing fruits/vegetbles in small stages and has fatigue>3 pts.    Time 10    Period Weeks    Status New    Target Date 06/24/20      PT LONG TERM GOAL  #9   TITLE Pt. will be able to attend a full athletic event for her children without Sx increasing by more than 2 pts.     Baseline Pt. having migraine or severe bladder spasms and fatigue and back pain> 3 pt. increase with attendance at athletic events    Time 20    Period Weeks    Status New  Target Date 09/10/20                 Plan - 05/15/20 1725    Clinical Impression Statement Pt. Responded well to all interventions today, demonstrating decreased spasms, TTP and improved gait mechanics with cueing. They will continue to benefit from skilled physical therapy to work toward remaining goals and maximize function as well as decrease likelihood of symptom increase or recurrence.     PT Next Visit Plan Follow up on driving progress, try walking with external focus. Ask about taping/re-apply, further suprapubic scar work, deep-core, scapular tracking/strength, and balance, MFR to lower abdomen, Review pain diary, cupping to medial thighs and upper back, re-assess neural glides on LLE, focus on graded activity, and seated and standing balance/core strength. review I's Y's and T's in prone, thoracic mobility? perform further release and PNF for R>L shoulder blades, more scar-release and cupping at suprapubic scar, MFR around B ilium. continue to work on thoracolumbar rotation ROM and decreasing spasms, add SLS balance training, .    PT Home Exercise Plan A day: low back stretch, side-stretch, hamstring stretch, butterfly stretch, hip EXT in prone with knee bent, side-lying hip ABD, calf raises. B day: Chest-stretch, Thoracic extensions over a towel roll, seated rows and seated scapular retraction (pull shoulder blades together like "T's") with band, child's pose stretch, self internal TP release (especially when Pelvic floor spasms high), Every day: Pain diary, Posterior pelvic tilts with deep core activation and red band for obliques activation, bow-and-arrow, TENS unit 3+ times per day for 30 min and as needed if pain starts to increase. TENS at ASIS/PSIS at strong tingle for 1 hr. per day. Sleeper stretch PRN,  vagal nerve toning    Consulted and Agree with Plan of Care Patient           Patient will benefit from skilled therapeutic intervention in order to improve the following deficits and impairments:     Visit Diagnosis: Other muscle spasm  Muscle weakness (generalized)  Difficulty in walking, not elsewhere classified     Problem List Patient Active Problem List   Diagnosis Date Noted  . Seizure ([place]) 11/11/2018  . Major depressive disorder, recurrent episode, moderate ([place]) 02/07/2018  . Nephrolithiasis 04/16/2016  . Numbness 07/28/2015  . Bladder retention 06/23/2015  . Abdominal pain 06/04/2015  . Dizziness 05/18/2015  . Neck pain 05/18/2015  . Complicated migraine 41/32/4401  . Other fatigue 04/28/2015  . Abnormal finding on MRI of brain 04/28/2015  . D (diarrhea) 03/29/2015  . H/O disease 03/29/2015  . Abnormal weight loss 03/29/2015  . Muscle weakness (generalized) 03/14/2015  . Headache, migraine 03/10/2015   Willa Rough DPT, ATC Willa Rough 05/15/2020, 5:49 PM  [place] MAIN [facility] SERVICES [address] [place], [place], [address] Phone: [phone]   Fax:  [phone]  Name: Christine Chavez MRN: [id number] Date of Birth: 09-26-1980

## 2020-05-17 ENCOUNTER — Ambulatory Visit: Payer: BC Managed Care – PPO

## 2020-05-17 ENCOUNTER — Other Ambulatory Visit: Payer: Self-pay

## 2020-05-17 DIAGNOSIS — R262 Difficulty in walking, not elsewhere classified: Secondary | ICD-10-CM

## 2020-05-17 DIAGNOSIS — M6281 Muscle weakness (generalized): Secondary | ICD-10-CM

## 2020-05-17 DIAGNOSIS — M62838 Other muscle spasm: Secondary | ICD-10-CM | POA: Diagnosis not present

## 2020-05-17 NOTE — Therapy (Signed)
[place] MAIN [facility] SERVICES [address] [place], [place], [address] Phone: [phone]   Fax:  [phone]  Physical Therapy Treatment  The patient has been informed of current processes in place at Outpatient Rehab to protect patients from Covid-19 exposure including social distancing, schedule modifications, and new cleaning procedures. After discussing their particular risk with a therapist based on the patient's personal risk factors, the patient has decided to proceed with in-person therapy.  Patient Details  Name: Christine Chavez MRN: [id number] Date of Birth: 02-16-1981 No data recorded  Encounter Date: 05/17/2020   PT End of Session - 05/17/20 1223    Visit Number 82    Number of Visits 10    Date for PT Re-Evaluation 05/04/20    Authorization Type [employer]    Authorization Time Period from 04/15/20 through 06/24/20    Authorization - Visit Number 3    Authorization - Number of Visits 20    Progress Note Due on Visit 98    PT Start Time 6962    PT Stop Time 1135    PT Time Calculation (min) 60 min    Activity Tolerance Patient tolerated treatment well;No increased pain    Behavior During Therapy WFL for tasks assessed/performed           Past Medical History:  Diagnosis Date  . Complication of anesthesia    ? seizures after anesthesia   . Headache   . Migraines   . Neurogenic bladder   . Renal disorder   . Vision abnormalities     Past Surgical History:  Procedure Laterality Date  . ANTERIOR CRUCIATE LIGAMENT REPAIR  1997  . APPENDECTOMY    . COLONOSCOPY WITH PROPOFOL N/A 11/11/2018   Procedure: COLONOSCOPY WITH PROPOFOL;  Surgeon: [name], MD;  Location: [facility] ENDOSCOPY;  Service: Endoscopy;  Laterality: N/A;  . CYSTOSCOPY WITH STENT PLACEMENT Right 04/17/2016   Procedure: CYSTOSCOPY WITH STENT PLACEMENT;  Surgeon: [name], MD;  Location: [facility] ORS;  Service: Urology;  Laterality: Right;  .  ESOPHAGOGASTRODUODENOSCOPY (EGD) WITH PROPOFOL N/A 11/11/2018   Procedure: ESOPHAGOGASTRODUODENOSCOPY (EGD) WITH PROPOFOL;  Surgeon: [name], MD;  Location: [facility] ENDOSCOPY;  Service: Endoscopy;  Laterality: N/A;  . EXPLORATORY LAPAROTOMY  1999  . KIDNEY STONE SURGERY  04/2016  . REVISION UROSTOMY CUTANEOUS    . REVISION UROSTOMY CUTANEOUS  01/10/2018  . SUPRAPUBIC CATHETER PLACEMENT  08/2017  . TONSILLECTOMY      There were no vitals filed for this visit.  Pelvic Floor Physical Therapy Treatment Note  SCREENING  Changes in medications, allergies, or medical history?: they lowered her pregabalin and baclofen due to her feeling "like a zombie"  SUBJECTIVE  Patient reports: The results showed that the bacteria in her bladder is the one that is supposed to be there but she is still feeling like something is not quite right, thinks she may have had a kidney stone since she had the back pain and blood in her urine. Did not sleep well last night so she is tired today. Does not know why she was not sleeping well, it seems to have happened more over the last couple weeks. Did not feel particularly stressed/anxious about anything. Still noting a little bit of blood when wiping. Had a busy day yesterday and had to lay on a heating pad last night. Had a twinge of a HA this morning (~3/10) but it has been a lot better following last visit.  Has  been practicing balancing at the bathroom and kitchen counter, has been doing her hip-flexor stretches, chest-stretches, and pelvic tilts.   Her fatigue and balance seem to be worse on days when she is also feeling bad/having more pain. Also notices that when her vision seems more blurry that her other symptoms seem to be worse too. Her body's response to pain is often nausea, this has been the case for her throughout her adulthood.  Precautions:  Neurological diagnosis pending, has "non-specific" brain lesions   Pain update:  Location of pain: LB  and neck/shoulders (bladder) Current pain:  5/10) (5/10) Max pain: 7/10 (7/10 when it spasms) Least pain:  4/10 (4/10) Nature of pain: sharp to dull ache (grabbing)  **pain increased by .5 points following treatment  Patient Goals: Get rid of the discomfort with catheterizing and ache/bladder pain. Be able to take fewer medications and be able to enjoy spending time with her family again, doing "all the mom things"    OBJECTIVE  Changes in:  Observations/Posture:  L lumbar curve prominent with transverse processes more prominent on the L. (from previous note)  C3 spinous process deviated L through C2-3. (from previous note)  12/30: L up-slip and anterior rotation **following treatment L iliacus remained tight, not allowing for complete up-slip or rotation correction.   Range of Motion/Flexibilty:  (from 03/27/19) Spine: ~ 2-3 fingers from knee with SB B. ~ 50% reduced R rotation with stretch in L, ~ 75% reduced L rotation with increased tightness. HS: able to achieve 90 deg. Hip flexion in seated HS stretch. (from prior treatment)  Decreased PIVM through Thoracic, lumbar, and sacral spine with greatest restriction at T3-6. (from prior session)  12/30:  no decreased mobility at the sacrum with attempt at manual correction for L anterior rotation, hip-flexor spasm keeping Pt. From approp  Strength/MMT:  LE MMT (from 03-27-19) LE MMT Left Right  Hip flex:  (L2) /5 /5  Hip ext: 3+/5 3+/5  Hip abd: 4/5 4/5  Hip add: 4/5 3+/5  Hip IR 4/5 3+/5  Hip ER 4/5 4/5   (From 3/24): Pt. Demonstrates 3/5 strength with "I's, Y's, and T's" but 4/5 with W's. (from 6/22) DF: 9 reps with B. Eversion/Inversion: 4/5 on R, 4+/5 on L. DF: L 4+/5, R 3+/5  Seated IR/ER on L: 4/5, R 3+/5 (from 8-5) L DF and Eversion 4+/5 with first 1-3 reps and fatigue decreased to 4/4. (9/9) Pt. Demonstrates ~ 4+/5 B with increased spasticity B.   Balance/stability:  (6-29) Pt. Able to achieve good posture  without cueing but only able to maintain for ~ 2.5 min. Before seeking stability from the table and forward rolled shoulders. With cue to engage the deep core, Pt. Recognizes increased stability and comfort.  8/5: Pt. Demonstrating decreased stability in gait and standing balance today with fasciculations increasing in direct relation to muscular endurance required. Able to stand ~ 10 sec. In SLS with MIN UE support before intensity of spasms make attempt at balance futile.   12/30: Pt. Reports feeling less stable on LLE again, similar to August but less intense.   1/31: Able to stand for ~ 10 sec. In double leg stance and on RLE with no LOB the first attempt but required TC and VC for L glute med. Activation for LLE. Second attempt less cueing recuired for LLE but far more fasiculations noted. Was able to stand with slow perterbations in multiple directions applied to the shoulders for ~ 20 sec. Before fatigue slowed  reaction time.   Pelvic Floor External Exam: (From prior session)- [Introitus Appears: elevated Skin integrity: WNL Palpation: TTP to B STP Cough: limited motion Prolapse visible?: no Scar mobility: not assessed  Internal Vaginal Exam: Strength (PERF): Pt. Has difficulty sensing the PFM to generate contraction or relaxation due to high tone/neuro involvement Symmetry: L>R for spasm/TTP Palpation: TTP to all muscles throughout B Prolapse: none]  unable to coordinate squeeze and TTP to all areas with the greatest at B OI and coccygeus. ** following TP release Pt. Able to achieve 1/5 PFM contraction. Decreased TTP at B anterior PR/PC following treatment. (10/14).  TODAY: not re-assessed today.  Abdominal:  Pt. Demonstrated ability to recruit R>L obliques and TA with pelvic tilts. (from prior session)  Palpation: Decreased fascial and scar mobility through lower abdomen with radiating Sx. To the pelvis and LB. (from prior session)  12/30: TTP to L QL and Iliacus  Gait  Analysis:  (04/22/20) 2 min. Walk test: 12 meters on 1st attempt. 7.75 meters on 2nd attempt. (video)  Pt. Demonstrated crouched walk with difficulty controlling where she placed the R>L LE and ~ 3-4 near LOB while walking on 2nd attempt with PT at [facility] and using Rollator  (Pt. Feels like this would have been better 2-3 days ago even)  TODAY: Pt. Walked ~ 50 ft. x2 With rollator and gait belt. Attempted to walk to metronome cadence but with difficulty, ended up keeping to ~ 1/2 time of the tempo with ~ 50% consistency. Some difficulty with control noted but less than during initial walk test.  INTERVENTIONS THIS SESSION:  Therex: Reviewed standing hip ABD 2x5 on each side and educated on and practiced standing hip EXT 2x5 to increase postural stability and strength of pelvic stabilizers and core musculature. Made a plan to begin increasing endurance exercise on a stationary bike starting at 5 min. On level 2 intensity and increasing difficulty slowly using 1-2 point increased fatigue as a guideline for appropriate intensity level. Planned to begin using balance game 2x weekly to make balance training in the clinic more fun and give an external focus.  NM re-ed: Reviewed and practiced standing balance at hip-width and in SLS 2x10 sec. Hold in each and practiced standing balance with  Perturbations 2x20 sec with ~ 3 min. Rest break between sets to improve deep-core, hip, knee , and ankle coordination and balance reactions   Total time: 60 min.                              PT Short Term Goals - 04/15/20 1044      PT SHORT TERM GOAL #1   Title Patient will demonstrate coordinated diaphragmatic breathing with pelvic tilts to demonstrate improved control of diaphragm and TA, to allow for further strengthening of core musculature and decreased pelvic floor spasm.    Baseline Pt. demonstrates breathing dysfunction and poor PFM coordination evidenced by anal manometry As of  2/22: Pt. continues to have restriction in her diaphragm and near T/L junction but is able to intentionally use diaphragmatic breathing through the available ROM.    Time 5    Period Weeks    Status Achieved    Target Date 04/20/19      PT SHORT TERM GOAL #2   Title Patient will report a reduction in pain to no greater than 6/10 over the prior week to demonstrate symptom improvement.    Baseline Pain is 10/10  at worst, 2/10 at best As of 2/22: Pt. had achieved this goal for 1 week as of 1/21 but may have infection or other insult that caused pain to increase again. As of 4/29: Pain high of 8/10 but has been over-doing her activity over the past week. As of 6/10: Pt. was able to keep pain at 6 or below since prior visit by using TENS to help keep tension lower but has not had a full week to test if it will keep working. As of 7/6: Pt. had met this goal but has a new UTI and it caused pain to spike to 8/10 over the weekend. As of 11/9: Pt. has just finally made progress with long-standing UTI and new POC for treatment so she is still having pain spikes up to 8/10 but we expect greater improvement over the next few months. As of 12/30: still feels like she is trending in a better direction but over-did it over the holidays and pain spiked to 8/10. No higher than 6/10 over last 2 days, even with PMS cramping.    Time 5    Period Weeks    Status On-going    Target Date 06/24/20      PT SHORT TERM GOAL #3   Title Patient will demonstrate HEP x1 in the clinic to demonstrate understanding and proper form to allow for further improvement.    Baseline Pt. lacks knowledge of therepeutic exercises that can decrease her pain/Sx.    Time 5    Period Weeks    Status Achieved    Target Date 04/20/19      PT SHORT TERM GOAL #4   Title Patient will report consistent use of foot-stool (squatty-potty) for positioning with BM to decrease pain with BM and intra-abdominal pressure.    Baseline Pt. having  constipation due to PFM dysfunction    Time 5    Period Weeks    Status Achieved    Target Date 04/20/19             PT Long Term Goals - 04/23/20 0001      PT LONG TERM GOAL #1   Title Pt. will be able to participate in regular ADL's with least restrictive device without pain increasing greater than 2/10    Baseline Pt. limited in her ability to perform household duties by increased pain and fatigue. As of 4/29: Her legs have more endurance with walking before she gets a tremor. Still needs to sit down the majority of the time she is cooking. Still needs back support for prolonged sitting. As of 6/10: Pt. able to do for  ~1 hour before she needs to rest to prevent increased pain, pain is averaging ~ 4-5/10    Time 10    Period Weeks    Status On-going    Target Date 06/24/20      PT LONG TERM GOAL #2   Title Patient will score at or below 65/300  on the PFDI and 35% on the Female NIH-CPSI to demonstrate a clinically meaningful decrease in disability and distress due to pelvic floor dysfunction.    Baseline PFDI: 110/300, Female NIH-CPSI: 29/43 (67%) As of 4/29: 100/300 on PFDI and 30/43 on NIH-CPSI As of 7/6:  NIH-CPSI: 32/43 POPDI: 61/100, CRADI-8: 29/100, UDI: 44/100 (PFDI: 149/300), As of 04/15/20: Female NIH-CPSI: 29/43, PFDI: 84/300    Time 10    Period Weeks    Status On-going    Target Date 06/24/20  PT LONG TERM GOAL #3   Title Patient will report no pain with intercourse to demonstrate improved functional ability.    Baseline Pt. Having significant pain with intercourse. As of 4/29: is a little better (20-30%) but is worse on days where overall tension/pain is worse. As of 6/10: can have days where there is not much pain but other days where there is significant pain. As of 11/9: same as previous update. AS of 04/15/20: 25% improved from base-line    Time 10    Period Weeks    Status On-going    Target Date 06/24/20      PT LONG TERM GOAL #4   Title Pt will report  ability to work in her yard for greater than 30 minutes without extreme fatigue    Baseline limited to 10-15 minutes before pt requires ending activity. As of 4/29: ~ 20 min. As of 6/10: has not been gardening much but can do ~ 30 min of similar indoor activity level before increased fatigue.    Time 10    Period Weeks    Status Achieved    Target Date 10/23/19      PT LONG TERM GOAL #5   Title Pt. will be able to go 2-3 hours between emptying her bladder without increased pain and empty her bladder fully whether by urostomy, cath, or voluntary release.    Baseline Pt. has a urostomy but continues to have to self-catheterize. Has pain with bladder filling and when using catheter. As of 4/29: is doing better since starting to take the pregabalin andd baclofen but occasionally still has pain with bladder filling. It is bad when she has an infection or over-does activity, better on "normal" days which have been few and far between due to frequent UTI's. As of 6/10: Pt. has "good" and "bad" days but has more days where she can go 2-3 hours without increased pain. As of 04/15/20: able to wait 2 hours, not 3 yet    Time 10    Period Weeks    Status On-going    Target Date 06/24/20      PT LONG TERM GOAL #6   Title Patient will report having BM's at least every-other day with consistency between [facility] stool scale 3-5 over the prior week to demonstrate decreased constipation.    Baseline Pt. unable to have regula BM's without medication, manometry shows PFM dysfunction. As of 4/29: still having to use and enema or supository occasionally and still needing linzess. As of 6/10: Pt. has days where she can empty ok and days where she needs to use an Enema to stimulate a BM, feels that it is just the insertion of the enema, not the solution that does the job. As of 11/9: Pt. not having constipation due to colonic infection that has shifted her toward diarrhea. Having BM's daily, sometimes type 5. Has had  diarrhea more than constipation, still had to take linzess 1 time to go after 2 days without a BM.    Time 10    Period Weeks    Status On-going    Target Date 06/24/20      PT LONG TERM GOAL #7   Title Pt. will drive to a meaningful activity to demonstrate increased function and participation    Baseline Pt. has not been able to drive due to decreased LE control    Time 10    Period Weeks    Status New    Target Date 06/24/20  PT LONG TERM GOAL #8   Title Pt. will be able to perform 1 hr. of light housework reqiring intermittent standing and walking without increased pain/fatigue >2 pts.    Baseline Pt. becomes fatigued washing fruits/vegetbles in small stages and has fatigue>3 pts.    Time 10    Period Weeks    Status New    Target Date 06/24/20      PT LONG TERM GOAL  #9   TITLE Pt. will be able to attend a full athletic event for her children without Sx increasing by more than 2 pts.    Baseline Pt. having migraine or severe bladder spasms and fatigue and back pain> 3 pt. increase with attendance at athletic events    Time 20    Period Weeks    Status New    Target Date 09/10/20                 Plan - 05/17/20 1225    Clinical Impression Statement Pt. Responded well to all interventions today, demonstrating improved standing balance tolerance than was demonstrated in August with less UE support necessary, as well as understanding and correct performance of all education and exercises provided today. They will continue to benefit from skilled physical therapy to work toward remaining goals and maximize function as well as decrease likelihood of symptom increase or recurrence.    PT Next Visit Plan Follow up on driving progress, use balance game for 5 min., ask about stationary bike success/fatigue levels. Ask about taping/re-apply, further suprapubic scar work, deep-core, scapular tracking/strength, and balance, MFR to lower abdomen, Review pain diary, cupping to  medial thighs and upper back, re-assess neural glides on LLE, focus on graded activity, and seated and standing balance/core strength. review I's Y's and T's in prone, thoracic mobility? perform further release and PNF for R>L shoulder blades, more scar-release and cupping at suprapubic scar, MFR around B ilium. continue to work on thoracolumbar rotation ROM and decreasing spasms, add SLS balance training, .    PT Home Exercise Plan A day: low back stretch, side-stretch, hamstring stretch, butterfly stretch, hip EXT in prone with knee bent, standing hip ABD/EXT, calf raises. B day: Chest-stretch, Thoracic extensions over a towel roll, seated rows and seated scapular retraction (pull shoulder blades together like "T's") with band, child's pose stretch, self internal TP release (especially when Pelvic floor spasms high), Every day: Pain diary, Posterior pelvic tilts with deep core activation and red band for obliques activation, bow-and-arrow, TENS unit 3+ times per day for 30 min and as needed if pain starts to increase. TENS at ASIS/PSIS at strong tingle for 1 hr. per day. Sleeper stretch PRN, vagal nerve toning    Consulted and Agree with Plan of Care Patient           Patient will benefit from skilled therapeutic intervention in order to improve the following deficits and impairments:     Visit Diagnosis: Other muscle spasm  Muscle weakness (generalized)  Difficulty in walking, not elsewhere classified     Problem List Patient Active Problem List   Diagnosis Date Noted  . Seizure ([place]) 11/11/2018  . Major depressive disorder, recurrent episode, moderate ([place]) 02/07/2018  . Nephrolithiasis 04/16/2016  . Numbness 07/28/2015  . Bladder retention 06/23/2015  . Abdominal pain 06/04/2015  . Dizziness 05/18/2015  . Neck pain 05/18/2015  . Complicated migraine 16/04/930  . Other fatigue 04/28/2015  . Abnormal finding on MRI of brain 04/28/2015  . D (diarrhea) 03/29/2015  .  H/O disease  03/29/2015  . Abnormal weight loss 03/29/2015  . Muscle weakness (generalized) 03/14/2015  . Headache, migraine 03/10/2015   Willa Rough DPT, ATC Willa Rough 05/17/2020, 2:58 PM  [place] MAIN [facility] SERVICES [address] [place], [place], [address] Phone: [phone]   Fax:  [phone]  Name: Christine Chavez MRN: [id number] Date of Birth: [birth year]

## 2020-05-17 NOTE — Patient Instructions (Signed)
FOR stationary bike: Start at 5 min. At intensity of 2/10. Note your fatigue level after and the next day. If you are ~ 2 points higher than your average, that is a WIN! If you are 3 or more points higher, we need to decrease the time or intensity until we find a good starting place for you. When you can do the same amount of time/intensity for 3 times in a row without an increase in fatigue greater than 1 point above baseline, increase time by 1 min.    HIP Extension - Standing    Stand with support. Squeeze deep core and hold. Start with leg to the back on your toe and gently squeeze the buttock to lift the toe off of the ground. Hold for 1 second then repeat. You should feel this in the outer hip, not the front, not your side,  And not your back. Repeat __10_ times. Do _3__ sets  a day.

## 2020-05-20 ENCOUNTER — Ambulatory Visit: Payer: BC Managed Care – PPO | Attending: Gastroenterology

## 2020-05-20 ENCOUNTER — Other Ambulatory Visit: Payer: Self-pay

## 2020-05-20 DIAGNOSIS — M6281 Muscle weakness (generalized): Secondary | ICD-10-CM | POA: Diagnosis present

## 2020-05-20 DIAGNOSIS — M62838 Other muscle spasm: Secondary | ICD-10-CM | POA: Diagnosis present

## 2020-05-20 DIAGNOSIS — R262 Difficulty in walking, not elsewhere classified: Secondary | ICD-10-CM | POA: Diagnosis present

## 2020-05-20 NOTE — Therapy (Signed)
[place] MAIN [facility] SERVICES [address] [place], [place], [address] Phone: [phone]   Fax:  [phone]  Physical Therapy Treatment  The patient has been informed of current processes in place at Outpatient Rehab to protect patients from Covid-19 exposure including social distancing, schedule modifications, and new cleaning procedures. After discussing their particular risk with a therapist based on the patient's personal risk factors, the patient has decided to proceed with in-person therapy.  Patient Details  Name: Christine Chavez MRN: [id number] Date of Birth: 06-26-1980 No data recorded  Encounter Date: 05/20/2020   PT End of Session - 05/20/20 1209    Visit Number 71    Number of Visits 55    Date for PT Re-Evaluation 05/04/20    Authorization Type [employer]    Authorization Time Period from 04/15/20 through 06/24/20    Authorization - Visit Number 4    Authorization - Number of Visits 20    Progress Note Due on Visit 98    PT Start Time 1030    PT Stop Time 1130    PT Time Calculation (min) 60 min    Activity Tolerance Patient tolerated treatment well;No increased pain    Behavior During Therapy WFL for tasks assessed/performed           Past Medical History:  Diagnosis Date  . Complication of anesthesia    ? seizures after anesthesia   . Headache   . Migraines   . Neurogenic bladder   . Renal disorder   . Vision abnormalities     Past Surgical History:  Procedure Laterality Date  . ANTERIOR CRUCIATE LIGAMENT REPAIR  1997  . APPENDECTOMY    . COLONOSCOPY WITH PROPOFOL N/A 11/11/2018   Procedure: COLONOSCOPY WITH PROPOFOL;  Surgeon: [name], MD;  Location: [facility] ENDOSCOPY;  Service: Endoscopy;  Laterality: N/A;  . CYSTOSCOPY WITH STENT PLACEMENT Right 04/17/2016   Procedure: CYSTOSCOPY WITH STENT PLACEMENT;  Surgeon: [name], MD;  Location: [facility] ORS;  Service: Urology;  Laterality: Right;  .  ESOPHAGOGASTRODUODENOSCOPY (EGD) WITH PROPOFOL N/A 11/11/2018   Procedure: ESOPHAGOGASTRODUODENOSCOPY (EGD) WITH PROPOFOL;  Surgeon: [name], MD;  Location: [facility] ENDOSCOPY;  Service: Endoscopy;  Laterality: N/A;  . EXPLORATORY LAPAROTOMY  1999  . KIDNEY STONE SURGERY  04/2016  . REVISION UROSTOMY CUTANEOUS    . REVISION UROSTOMY CUTANEOUS  01/10/2018  . SUPRAPUBIC CATHETER PLACEMENT  08/2017  . TONSILLECTOMY      There were no vitals filed for this visit.  Pelvic Floor Physical Therapy Treatment Note  SCREENING  Changes in medications, allergies, or medical history?: She has a UTI and is considering inter-stim.  SUBJECTIVE  Patient reports: Doing "so-so" today, not feeling great. She talked to her doctor on Monday and they did a repeat culture which showed that she does have a UTI again. They were able to discuss the Interstim procedure more and let her know that 2 of her colleagues do it at that office. Started her Period on Monday after her appointment. Has not tried the bike yet.   Precautions:  Neurological diagnosis pending, has "non-specific" brain lesions   Pain update:  Location of pain: LB and neck/shoulders (bladder) Current pain:  5/10) (5/10) Max pain: 7/10 (7/10 when it spasms) Least pain:  4/10 (4/10) Nature of pain: sharp to dull ache (grabbing)  ** Improved ease of breathing following release. no increased pain  Patient Goals: Get rid of the discomfort with catheterizing and ache/bladder  pain. Be able to take fewer medications and be able to enjoy spending time with her family again, doing "all the mom things"    OBJECTIVE  Changes in:  Observations/Posture:  L lumbar curve prominent with transverse processes more prominent on the L. (from previous note)  C3 spinous process deviated L through C2-3. (from previous note)  12/30: L up-slip and anterior rotation **following treatment L iliacus remained tight, not allowing for complete up-slip or  rotation correction.   Range of Motion/Flexibilty:  (from 03/27/19) Spine: ~ 2-3 fingers from knee with SB B. ~ 50% reduced R rotation with stretch in L, ~ 75% reduced L rotation with increased tightness. HS: able to achieve 90 deg. Hip flexion in seated HS stretch. (from prior treatment)  Decreased PIVM through Thoracic, lumbar, and sacral spine with greatest restriction at T3-6. (from prior session)  12/30:  no decreased mobility at the sacrum with attempt at manual correction for L anterior rotation, hip-flexor spasm keeping Pt. From approp  TODAY: Pt. Reports having bad acid-reflux, as well as pelvic and back pain and demonstrates decreased diaphragm excursion.  Strength/MMT:  LE MMT (from 03-27-19) LE MMT Left Right  Hip flex:  (L2) /5 /5  Hip ext: 3+/5 3+/5  Hip abd: 4/5 4/5  Hip add: 4/5 3+/5  Hip IR 4/5 3+/5  Hip ER 4/5 4/5   (From 3/24): Pt. Demonstrates 3/5 strength with "I's, Y's, and T's" but 4/5 with W's. (from 6/22) DF: 9 reps with B. Eversion/Inversion: 4/5 on R, 4+/5 on L. DF: L 4+/5, R 3+/5  Seated IR/ER on L: 4/5, R 3+/5 (from 8-5) L DF and Eversion 4+/5 with first 1-3 reps and fatigue decreased to 4/4. (9/9) Pt. Demonstrates ~ 4+/5 B with increased spasticity B.   Balance/stability:  (6-29) Pt. Able to achieve good posture without cueing but only able to maintain for ~ 2.5 min. Before seeking stability from the table and forward rolled shoulders. With cue to engage the deep core, Pt. Recognizes increased stability and comfort.  8/5: Pt. Demonstrating decreased stability in gait and standing balance today with fasciculations increasing in direct relation to muscular endurance required. Able to stand ~ 10 sec. In SLS with MIN UE support before intensity of spasms make attempt at balance futile.   12/30: Pt. Reports feeling less stable on LLE again, similar to August but less intense.   1/31: Able to stand for ~ 10 sec. In double leg stance and on RLE with no LOB  the first attempt but required TC and VC for L glute med. Activation for LLE. Second attempt less cueing recuired for LLE but far more fasiculations noted. Was able to stand with slow perterbations in multiple directions applied to the shoulders for ~ 20 sec. Before fatigue slowed reaction time.   Pelvic Floor External Exam: (From prior session)- [Introitus Appears: elevated Skin integrity: WNL Palpation: TTP to B STP Cough: limited motion Prolapse visible?: no Scar mobility: not assessed  Internal Vaginal Exam: Strength (PERF): Pt. Has difficulty sensing the PFM to generate contraction or relaxation due to high tone/neuro involvement Symmetry: L>R for spasm/TTP Palpation: TTP to all muscles throughout B Prolapse: none]  unable to coordinate squeeze and TTP to all areas with the greatest at B OI and coccygeus. ** following TP release Pt. Able to achieve 1/5 PFM contraction. Decreased TTP at B anterior PR/PC following treatment. (10/14).  TODAY: not re-assessed today.  Abdominal:  Pt. Demonstrated ability to recruit R>L obliques and TA with  pelvic tilts. (from prior session)  Palpation: Decreased fascial and scar mobility through lower abdomen with radiating Sx. To the pelvis and LB. (from prior session)  12/30: TTP to L QL and Iliacus  TODAY: TTP to B diaphragm   Gait Analysis:  (04/22/20) 2 min. Walk test: 12 meters on 1st attempt. 7.75 meters on 2nd attempt. (video)  Pt. Demonstrated crouched walk with difficulty controlling where she placed the R>L LE and ~ 3-4 near LOB while walking on 2nd attempt with PT at [facility] and using Rollator  (Pt. Feels like this would have been better 2-3 days ago even)  TODAY: Pt. Walked ~ 50 ft. x2 With rollator and gait belt. Attempted to walk to metronome cadence but with difficulty, ended up keeping to ~ 1/2 time of the tempo with ~ 50% consistency. Some difficulty with control noted but less than during initial walk test.  INTERVENTIONS  THIS SESSION: Manual: Performed TP release to B diaphragm with slow but steady response to allow for improved ROM and decreased spasm to help ease pain and keep from perpetuating PFM spasms as well as to allow for decreased acid reflux and anxiety.  Self-care: discussed her recent appointments and potential of E-stim as well as further discussed the potential of getting hand controls and a lift for her electric chair to allow her improved ability to participate in meaningful activities.   Total time: 60 min.                               PT Short Term Goals - 04/15/20 1044      PT SHORT TERM GOAL #1   Title Patient will demonstrate coordinated diaphragmatic breathing with pelvic tilts to demonstrate improved control of diaphragm and TA, to allow for further strengthening of core musculature and decreased pelvic floor spasm.    Baseline Pt. demonstrates breathing dysfunction and poor PFM coordination evidenced by anal manometry As of 2/22: Pt. continues to have restriction in her diaphragm and near T/L junction but is able to intentionally use diaphragmatic breathing through the available ROM.    Time 5    Period Weeks    Status Achieved    Target Date 04/20/19      PT SHORT TERM GOAL #2   Title Patient will report a reduction in pain to no greater than 6/10 over the prior week to demonstrate symptom improvement.    Baseline Pain is 10/10 at worst, 2/10 at best As of 2/22: Pt. had achieved this goal for 1 week as of 1/21 but may have infection or other insult that caused pain to increase again. As of 4/29: Pain high of 8/10 but has been over-doing her activity over the past week. As of 6/10: Pt. was able to keep pain at 6 or below since prior visit by using TENS to help keep tension lower but has not had a full week to test if it will keep working. As of 7/6: Pt. had met this goal but has a new UTI and it caused pain to spike to 8/10 over the weekend. As of 11/9: Pt.  has just finally made progress with long-standing UTI and new POC for treatment so she is still having pain spikes up to 8/10 but we expect greater improvement over the next few months. As of 12/30: still feels like she is trending in a better direction but over-did it over the holidays and pain spiked to 8/10.  No higher than 6/10 over last 2 days, even with PMS cramping.    Time 5    Period Weeks    Status On-going    Target Date 06/24/20      PT SHORT TERM GOAL #3   Title Patient will demonstrate HEP x1 in the clinic to demonstrate understanding and proper form to allow for further improvement.    Baseline Pt. lacks knowledge of therepeutic exercises that can decrease her pain/Sx.    Time 5    Period Weeks    Status Achieved    Target Date 04/20/19      PT SHORT TERM GOAL #4   Title Patient will report consistent use of foot-stool (squatty-potty) for positioning with BM to decrease pain with BM and intra-abdominal pressure.    Baseline Pt. having constipation due to PFM dysfunction    Time 5    Period Weeks    Status Achieved    Target Date 04/20/19             PT Long Term Goals - 04/23/20 0001      PT LONG TERM GOAL #1   Title Pt. will be able to participate in regular ADL's with least restrictive device without pain increasing greater than 2/10    Baseline Pt. limited in her ability to perform household duties by increased pain and fatigue. As of 4/29: Her legs have more endurance with walking before she gets a tremor. Still needs to sit down the majority of the time she is cooking. Still needs back support for prolonged sitting. As of 6/10: Pt. able to do for  ~1 hour before she needs to rest to prevent increased pain, pain is averaging ~ 4-5/10    Time 10    Period Weeks    Status On-going    Target Date 06/24/20      PT LONG TERM GOAL #2   Title Patient will score at or below 65/300  on the PFDI and 35% on the Female NIH-CPSI to demonstrate a clinically meaningful  decrease in disability and distress due to pelvic floor dysfunction.    Baseline PFDI: 110/300, Female NIH-CPSI: 29/43 (67%) As of 4/29: 100/300 on PFDI and 30/43 on NIH-CPSI As of 7/6:  NIH-CPSI: 32/43 POPDI: 61/100, CRADI-8: 29/100, UDI: 44/100 (PFDI: 149/300), As of 04/15/20: Female NIH-CPSI: 29/43, PFDI: 84/300    Time 10    Period Weeks    Status On-going    Target Date 06/24/20      PT LONG TERM GOAL #3   Title Patient will report no pain with intercourse to demonstrate improved functional ability.    Baseline Pt. Having significant pain with intercourse. As of 4/29: is a little better (20-30%) but is worse on days where overall tension/pain is worse. As of 6/10: can have days where there is not much pain but other days where there is significant pain. As of 11/9: same as previous update. AS of 04/15/20: 25% improved from base-line    Time 10    Period Weeks    Status On-going    Target Date 06/24/20      PT LONG TERM GOAL #4   Title Pt will report ability to work in her yard for greater than 30 minutes without extreme fatigue    Baseline limited to 10-15 minutes before pt requires ending activity. As of 4/29: ~ 20 min. As of 6/10: has not been gardening much but can do ~ 30 min of similar indoor activity  level before increased fatigue.    Time 10    Period Weeks    Status Achieved    Target Date 10/23/19      PT LONG TERM GOAL #5   Title Pt. will be able to go 2-3 hours between emptying her bladder without increased pain and empty her bladder fully whether by urostomy, cath, or voluntary release.    Baseline Pt. has a urostomy but continues to have to self-catheterize. Has pain with bladder filling and when using catheter. As of 4/29: is doing better since starting to take the pregabalin andd baclofen but occasionally still has pain with bladder filling. It is bad when she has an infection or over-does activity, better on "normal" days which have been few and far between due to  frequent UTI's. As of 6/10: Pt. has "good" and "bad" days but has more days where she can go 2-3 hours without increased pain. As of 04/15/20: able to wait 2 hours, not 3 yet    Time 10    Period Weeks    Status On-going    Target Date 06/24/20      PT LONG TERM GOAL #6   Title Patient will report having BM's at least every-other day with consistency between [facility] stool scale 3-5 over the prior week to demonstrate decreased constipation.    Baseline Pt. unable to have regula BM's without medication, manometry shows PFM dysfunction. As of 4/29: still having to use and enema or supository occasionally and still needing linzess. As of 6/10: Pt. has days where she can empty ok and days where she needs to use an Enema to stimulate a BM, feels that it is just the insertion of the enema, not the solution that does the job. As of 11/9: Pt. not having constipation due to colonic infection that has shifted her toward diarrhea. Having BM's daily, sometimes type 5. Has had diarrhea more than constipation, still had to take linzess 1 time to go after 2 days without a BM.    Time 10    Period Weeks    Status On-going    Target Date 06/24/20      PT LONG TERM GOAL #7   Title Pt. will drive to a meaningful activity to demonstrate increased function and participation    Baseline Pt. has not been able to drive due to decreased LE control    Time 10    Period Weeks    Status New    Target Date 06/24/20      PT LONG TERM GOAL #8   Title Pt. will be able to perform 1 hr. of light housework reqiring intermittent standing and walking without increased pain/fatigue >2 pts.    Baseline Pt. becomes fatigued washing fruits/vegetbles in small stages and has fatigue>3 pts.    Time 10    Period Weeks    Status New    Target Date 06/24/20      PT LONG TERM GOAL  #9   TITLE Pt. will be able to attend a full athletic event for her children without Sx increasing by more than 2 pts.    Baseline Pt. having migraine or  severe bladder spasms and fatigue and back pain> 3 pt. increase with attendance at athletic events    Time 20    Period Weeks    Status New    Target Date 09/10/20                 Plan - 05/20/20  1210    Clinical Impression Statement Pt. Responded well to all interventions today, demonstrating decreased spasm and improved ease of breathing and posture as well as understanding and correct performance of all education and exercises provided today. They will continue to benefit from skilled physical therapy to work toward remaining goals and maximize function as well as decrease likelihood of symptom increase or recurrence.    PT Next Visit Plan Follow up on driving progress, use balance game for 5 min., ask about stationary bike success/fatigue levels. Ask about taping/re-apply, further suprapubic scar work, deep-core, scapular tracking/strength, and balance, MFR to lower abdomen, Review pain diary, cupping to medial thighs and upper back, re-assess neural glides on LLE, focus on graded activity, and seated and standing balance/core strength. review I's Y's and T's in prone, thoracic mobility? perform further release and PNF for R>L shoulder blades, more scar-release and cupping at suprapubic scar, MFR around B ilium. continue to work on thoracolumbar rotation ROM and decreasing spasms, add SLS balance training, .    PT Home Exercise Plan A day: low back stretch, side-stretch, hamstring stretch, butterfly stretch, hip EXT in prone with knee bent, standing hip ABD/EXT, calf raises. B day: Chest-stretch, Thoracic extensions over a towel roll, seated rows and seated scapular retraction (pull shoulder blades together like "T's") with band, child's pose stretch, self internal TP release (especially when Pelvic floor spasms high), Every day: Pain diary, Posterior pelvic tilts with deep core activation and red band for obliques activation, bow-and-arrow, TENS unit 3+ times per day for 30 min and as needed  if pain starts to increase. TENS at ASIS/PSIS at strong tingle for 1 hr. per day. Sleeper stretch PRN, vagal nerve toning    Consulted and Agree with Plan of Care Patient           Patient will benefit from skilled therapeutic intervention in order to improve the following deficits and impairments:     Visit Diagnosis: Other muscle spasm  Muscle weakness (generalized)  Difficulty in walking, not elsewhere classified     Problem List Patient Active Problem List   Diagnosis Date Noted  . Seizure ([place]) 11/11/2018  . Major depressive disorder, recurrent episode, moderate ([place]) 02/07/2018  . Nephrolithiasis 04/16/2016  . Numbness 07/28/2015  . Bladder retention 06/23/2015  . Abdominal pain 06/04/2015  . Dizziness 05/18/2015  . Neck pain 05/18/2015  . Complicated migraine 65/99/3570  . Other fatigue 04/28/2015  . Abnormal finding on MRI of brain 04/28/2015  . D (diarrhea) 03/29/2015  . H/O disease 03/29/2015  . Abnormal weight loss 03/29/2015  . Muscle weakness (generalized) 03/14/2015  . Headache, migraine 03/10/2015   Willa Rough DPT, ATC Willa Rough 05/20/2020, 12:21 PM  [place] MAIN [facility] SERVICES [address] [place], [place], [address] Phone: [phone]   Fax:  [phone]  Name: Christine Chavez MRN: [id number] Date of Birth: 04/21/1980

## 2020-05-24 ENCOUNTER — Ambulatory Visit: Payer: BC Managed Care – PPO

## 2020-05-27 ENCOUNTER — Ambulatory Visit: Payer: BC Managed Care – PPO

## 2020-05-27 DIAGNOSIS — R262 Difficulty in walking, not elsewhere classified: Secondary | ICD-10-CM

## 2020-05-27 DIAGNOSIS — M62838 Other muscle spasm: Secondary | ICD-10-CM

## 2020-05-27 DIAGNOSIS — M6281 Muscle weakness (generalized): Secondary | ICD-10-CM

## 2020-05-27 NOTE — Therapy (Signed)
Christine Chavez [facility] SERVICES [address] [place], [place], [address] Phone: [phone]   Fax:  [phone]  Physical Therapy Treatment  The patient has been informed of current processes in place at Outpatient Rehab to protect patients from Covid-19 exposure including social distancing, schedule modifications, and new cleaning procedures. After discussing their particular risk with a therapist based on the patient's personal risk factors, the patient has decided to proceed with in-person therapy.  Patient Details  Name: Christine Chavez MRN: [id number] Date of Birth: 04-10-1981 No data recorded  Encounter Date: 05/27/2020   PT End of Session - 05/28/20 1427    Visit Number 61    Number of Visits 33    Date for PT Re-Evaluation 05/04/20    Authorization Type [employer]    Authorization Time Period from 04/15/20 through 06/24/20    Authorization - Visit Number 5    Authorization - Number of Visits 20    Progress Note Due on Visit 98    PT Start Time 1030    PT Stop Time 1130    PT Time Calculation (min) 60 min    Activity Tolerance Patient tolerated treatment well;No increased pain    Behavior During Therapy WFL for tasks assessed/performed           Past Medical History:  Diagnosis Date  . Complication of anesthesia    ? seizures after anesthesia   . Headache   . Migraines   . Neurogenic bladder   . Renal disorder   . Vision abnormalities     Past Surgical History:  Procedure Laterality Date  . ANTERIOR CRUCIATE LIGAMENT REPAIR  1997  . APPENDECTOMY    . COLONOSCOPY WITH PROPOFOL N/A 11/11/2018   Procedure: COLONOSCOPY WITH PROPOFOL;  Surgeon: [name], MD;  Location: [facility] ENDOSCOPY;  Service: Endoscopy;  Laterality: N/A;  . CYSTOSCOPY WITH STENT PLACEMENT Right 04/17/2016   Procedure: CYSTOSCOPY WITH STENT PLACEMENT;  Surgeon: [name], MD;  Location: [facility] ORS;  Service: Urology;  Laterality: Right;  .  ESOPHAGOGASTRODUODENOSCOPY (EGD) WITH PROPOFOL N/A 11/11/2018   Procedure: ESOPHAGOGASTRODUODENOSCOPY (EGD) WITH PROPOFOL;  Surgeon: [name], MD;  Location: [facility] ENDOSCOPY;  Service: Endoscopy;  Laterality: N/A;  . EXPLORATORY LAPAROTOMY  1999  . KIDNEY STONE SURGERY  04/2016  . REVISION UROSTOMY CUTANEOUS    . REVISION UROSTOMY CUTANEOUS  01/10/2018  . SUPRAPUBIC CATHETER PLACEMENT  08/2017  . TONSILLECTOMY      There were no vitals filed for this visit.  Pelvic Floor Physical Therapy Treatment Note  SCREENING  Changes in medications, allergies, or medical history?: She has a UTI and is considering inter-stim.  SUBJECTIVE  Patient reports: She had a lot of pain after the procedure on Tuesday abut is not sure how much is also coming from the UTI. It is feeling a little better today than it was. Neck/shoulders ~ 3/10. After the diaphragm release she has had a lot less heart burn and improved ease of breathing.  Precautions:  Neurological diagnosis pending, has "non-specific" brain lesions   Pain update:  Location of pain: LB and neck/shoulders (bladder) Current pain:  4/10) (4/10) Max pain: 8/10 (8/10 when it spasms) Least pain:  4/10 (4/10) Nature of pain: sharp to dull ache (grabbing)  **slightly less pain in the back following treatment, feels like it is "still working"  Patient Goals: Get rid of the discomfort with catheterizing and ache/bladder pain. Be able to take fewer medications and be  able to enjoy spending time with her family again, doing "all the mom things"    OBJECTIVE  Changes in:  Observations/Posture:  L lumbar curve prominent with transverse processes more prominent on the L. (from previous note)  C3 spinous process deviated L through C2-3. (from previous note)  12/30: L up-slip and anterior rotation **following treatment L iliacus remained tight, not allowing for complete up-slip or rotation correction.   Range of Motion/Flexibilty:   (from 03/27/19) Spine: ~ 2-3 fingers from knee with SB B. ~ 50% reduced R rotation with stretch in L, ~ 75% reduced L rotation with increased tightness. HS: able to achieve 90 deg. Hip flexion in seated HS stretch. (from prior treatment)  Decreased PIVM through Thoracic, lumbar, and sacral spine with greatest restriction at T3-6. (from prior session)  12/30:  no decreased mobility at the sacrum with attempt at manual correction for L anterior rotation, hip-flexor spasm keeping Pt. From approp   Strength/MMT:  LE MMT (from 03-27-19) LE MMT Left Right  Hip flex:  (L2) /5 /5  Hip ext: 3+/5 3+/5  Hip abd: 4/5 4/5  Hip add: 4/5 3+/5  Hip IR 4/5 3+/5  Hip ER 4/5 4/5   (From 3/24): Pt. Demonstrates 3/5 strength with "I's, Y's, and T's" but 4/5 with W's. (from 6/22) DF: 9 reps with B. Eversion/Inversion: 4/5 on R, 4+/5 on L. DF: L 4+/5, R 3+/5  Seated IR/ER on L: 4/5, R 3+/5 (from 8-5) L DF and Eversion 4+/5 with first 1-3 reps and fatigue decreased to 4/4. (9/9) Pt. Demonstrates ~ 4+/5 B with increased spasticity B.   Balance/stability:  (6-29) Pt. Able to achieve good posture without cueing but only able to maintain for ~ 2.5 min. Before seeking stability from the table and forward rolled shoulders. With cue to engage the deep core, Pt. Recognizes increased stability and comfort.  8/5: Pt. Demonstrating decreased stability in gait and standing balance today with fasciculations increasing in direct relation to muscular endurance required. Able to stand ~ 10 sec. In SLS with MIN UE support before intensity of spasms make attempt at balance futile.   12/30: Pt. Reports feeling less stable on LLE again, similar to August but less intense.   1/31: Able to stand for ~ 10 sec. In double leg stance and on RLE with no LOB the first attempt but required TC and VC for L glute med. Activation for LLE. Second attempt less cueing recuired for LLE but far more fasiculations noted. Was able to stand with  slow perterbations in multiple directions applied to the shoulders for ~ 20 sec. Before fatigue slowed reaction time.   Pelvic Floor External Exam: (From prior session)- [Introitus Appears: elevated Skin integrity: WNL Palpation: TTP to B STP Cough: limited motion Prolapse visible?: no Scar mobility: not assessed  Internal Vaginal Exam: Strength (PERF): Pt. Has difficulty sensing the PFM to generate contraction or relaxation due to high tone/neuro involvement Symmetry: L>R for spasm/TTP Palpation: TTP to all muscles throughout B Prolapse: none]  unable to coordinate squeeze and TTP to all areas with the greatest at B OI and coccygeus. ** following TP release Pt. Able to achieve 1/5 PFM contraction. Decreased TTP at B anterior PR/PC following treatment. (10/14).  TODAY: not re-assessed today.  Abdominal:  Pt. Demonstrated ability to recruit R>L obliques and TA with pelvic tilts. (from prior session)  Palpation: Decreased fascial and scar mobility through lower abdomen with radiating Sx. To the pelvis and LB. (from prior session)  12/30: TTP to L QL and Iliacus  TODAY: TTP to B Erector spinae and multifidus near L1 as well as B glute Max   Gait Analysis:  (04/22/20) 2 min. Walk test: 12 meters on 1st attempt. 7.75 meters on 2nd attempt. (video)  Pt. Demonstrated crouched walk with difficulty controlling where she placed the R>L LE and ~ 3-4 near LOB while walking on 2nd attempt with PT at [facility] and using Rollator  (Pt. Feels like this would have been better 2-3 days ago even)  2/3: Pt. Walked ~ 50 ft. x2 With rollator and gait belt. Attempted to walk to metronome cadence but with difficulty, ended up keeping to ~ 1/2 time of the tempo with ~ 50% consistency. Some difficulty with control noted but less than during initial walk test.  INTERVENTIONS THIS SESSION: Manual: Performed TP release to B Erector spinae and multifidus near L1 as well as B glute Max to decrease spasm and  pain and allow for improved balance of musculature for improved function and decreased symptoms.  Dry-needle: Performed TPDN with a .30x48mm needle and standard approach as described below to decrease spasm and pain and allow for improved balance of musculature for improved function and decreased symptoms.   Total time: 60 min.                       Trigger Point Dry Needling - 05/28/20 0001    Consent Given? Yes    Education Handout Provided No    Muscles Treated Back/Hip Lumbar multifidi;Erector spinae    Dry Needling Comments bilateral at ~ L1    Erector spinae Response Twitch response elicited;Palpable increased muscle length    Lumbar multifidi Response Twitch response elicited;Palpable increased muscle length                  PT Short Term Goals - 04/15/20 1044      PT SHORT TERM GOAL #1   Title Patient will demonstrate coordinated diaphragmatic breathing with pelvic tilts to demonstrate improved control of diaphragm and TA, to allow for further strengthening of core musculature and decreased pelvic floor spasm.    Baseline Pt. demonstrates breathing dysfunction and poor PFM coordination evidenced by anal manometry As of 2/22: Pt. continues to have restriction in her diaphragm and near T/L junction but is able to intentionally use diaphragmatic breathing through the available ROM.    Time 5    Period Weeks    Status Achieved    Target Date 04/20/19      PT SHORT TERM GOAL #2   Title Patient will report a reduction in pain to no greater than 6/10 over the prior week to demonstrate symptom improvement.    Baseline Pain is 10/10 at worst, 2/10 at best As of 2/22: Pt. had achieved this goal for 1 week as of 1/21 but may have infection or other insult that caused pain to increase again. As of 4/29: Pain high of 8/10 but has been over-doing her activity over the past week. As of 6/10: Pt. was able to keep pain at 6 or below since prior visit by using TENS to  help keep tension lower but has not had a full week to test if it will keep working. As of 7/6: Pt. had met this goal but has a new UTI and it caused pain to spike to 8/10 over the weekend. As of 11/9: Pt. has just finally made progress with long-standing UTI and new POC for  treatment so she is still having pain spikes up to 8/10 but we expect greater improvement over the next few months. As of 12/30: still feels like she is trending in a better direction but over-did it over the holidays and pain spiked to 8/10. No higher than 6/10 over last 2 days, even with PMS cramping.    Time 5    Period Weeks    Status On-going    Target Date 06/24/20      PT SHORT TERM GOAL #3   Title Patient will demonstrate HEP x1 in the clinic to demonstrate understanding and proper form to allow for further improvement.    Baseline Pt. lacks knowledge of therepeutic exercises that can decrease her pain/Sx.    Time 5    Period Weeks    Status Achieved    Target Date 04/20/19      PT SHORT TERM GOAL #4   Title Patient will report consistent use of foot-stool (squatty-potty) for positioning with BM to decrease pain with BM and intra-abdominal pressure.    Baseline Pt. having constipation due to PFM dysfunction    Time 5    Period Weeks    Status Achieved    Target Date 04/20/19             PT Long Term Goals - 04/23/20 0001      PT LONG TERM GOAL #1   Title Pt. will be able to participate in regular ADL's with least restrictive device without pain increasing greater than 2/10    Baseline Pt. limited in her ability to perform household duties by increased pain and fatigue. As of 4/29: Her legs have more endurance with walking before she gets a tremor. Still needs to sit down the majority of the time she is cooking. Still needs back support for prolonged sitting. As of 6/10: Pt. able to do for  ~1 hour before she needs to rest to prevent increased pain, pain is averaging ~ 4-5/10    Time 10    Period Weeks     Status On-going    Target Date 06/24/20      PT LONG TERM GOAL #2   Title Patient will score at or below 65/300  on the PFDI and 35% on the Female NIH-CPSI to demonstrate a clinically meaningful decrease in disability and distress due to pelvic floor dysfunction.    Baseline PFDI: 110/300, Female NIH-CPSI: 29/43 (67%) As of 4/29: 100/300 on PFDI and 30/43 on NIH-CPSI As of 7/6:  NIH-CPSI: 32/43 POPDI: 61/100, CRADI-8: 29/100, UDI: 44/100 (PFDI: 149/300), As of 04/15/20: Female NIH-CPSI: 29/43, PFDI: 84/300    Time 10    Period Weeks    Status On-going    Target Date 06/24/20      PT LONG TERM GOAL #3   Title Patient will report no pain with intercourse to demonstrate improved functional ability.    Baseline Pt. Having significant pain with intercourse. As of 4/29: is a little better (20-30%) but is worse on days where overall tension/pain is worse. As of 6/10: can have days where there is not much pain but other days where there is significant pain. As of 11/9: same as previous update. AS of 04/15/20: 25% improved from base-line    Time 10    Period Weeks    Status On-going    Target Date 06/24/20      PT LONG TERM GOAL #4   Title Pt will report ability to work in her yard  for greater than 30 minutes without extreme fatigue    Baseline limited to 10-15 minutes before pt requires ending activity. As of 4/29: ~ 20 min. As of 6/10: has not been gardening much but can do ~ 30 min of similar indoor activity level before increased fatigue.    Time 10    Period Weeks    Status Achieved    Target Date 10/23/19      PT LONG TERM GOAL #5   Title Pt. will be able to go 2-3 hours between emptying her bladder without increased pain and empty her bladder fully whether by urostomy, cath, or voluntary release.    Baseline Pt. has a urostomy but continues to have to self-catheterize. Has pain with bladder filling and when using catheter. As of 4/29: is doing better since starting to take the pregabalin  andd baclofen but occasionally still has pain with bladder filling. It is bad when she has an infection or over-does activity, better on "normal" days which have been few and far between due to frequent UTI's. As of 6/10: Pt. has "good" and "bad" days but has more days where she can go 2-3 hours without increased pain. As of 04/15/20: able to wait 2 hours, not 3 yet    Time 10    Period Weeks    Status On-going    Target Date 06/24/20      PT LONG TERM GOAL #6   Title Patient will report having BM's at least every-other day with consistency between [facility] stool scale 3-5 over the prior week to demonstrate decreased constipation.    Baseline Pt. unable to have regula BM's without medication, manometry shows PFM dysfunction. As of 4/29: still having to use and enema or supository occasionally and still needing linzess. As of 6/10: Pt. has days where she can empty ok and days where she needs to use an Enema to stimulate a BM, feels that it is just the insertion of the enema, not the solution that does the job. As of 11/9: Pt. not having constipation due to colonic infection that has shifted her toward diarrhea. Having BM's daily, sometimes type 5. Has had diarrhea more than constipation, still had to take linzess 1 time to go after 2 days without a BM.    Time 10    Period Weeks    Status On-going    Target Date 06/24/20      PT LONG TERM GOAL #7   Title Pt. will drive to a meaningful activity to demonstrate increased function and participation    Baseline Pt. has not been able to drive due to decreased LE control    Time 10    Period Weeks    Status New    Target Date 06/24/20      PT LONG TERM GOAL #8   Title Pt. will be able to perform 1 hr. of light housework reqiring intermittent standing and walking without increased pain/fatigue >2 pts.    Baseline Pt. becomes fatigued washing fruits/vegetbles in small stages and has fatigue>3 pts.    Time 10    Period Weeks    Status New    Target  Date 06/24/20      PT LONG TERM GOAL  #9   TITLE Pt. will be able to attend a full athletic event for her children without Sx increasing by more than 2 pts.    Baseline Pt. having migraine or severe bladder spasms and fatigue and back pain> 3 pt. increase  with attendance at athletic events    Time 20    Period Weeks    Status New    Target Date 09/10/20                 Plan - 05/28/20 1429    Clinical Impression Statement Pt. Responded well to all interventions today, demonstrating decreased spasm and TTP through the mid/low back and posterior hip as well as understanding and correct performance of all education and exercises provided today. They will continue to benefit from skilled physical therapy to work toward remaining goals and maximize function as well as decrease likelihood of symptom increase or recurrence.    PT Next Visit Plan Follow up on driving progress, use balance game for 5 min., ask about stationary bike success/fatigue levels. Ask about taping/re-apply, further suprapubic scar work, deep-core, scapular tracking/strength, and balance, MFR to lower abdomen, Review pain diary, cupping to medial thighs and upper back, re-assess neural glides on LLE, focus on graded activity, and seated and standing balance/core strength. review I's Y's and T's in prone, thoracic mobility? perform further release and PNF for R>L shoulder blades, more scar-release and cupping at suprapubic scar, MFR around B ilium. continue to work on thoracolumbar rotation ROM and decreasing spasms, add SLS balance training, .    PT Home Exercise Plan A day: low back stretch, side-stretch, hamstring stretch, butterfly stretch, hip EXT in prone with knee bent, standing hip ABD/EXT, calf raises. B day: Chest-stretch, Thoracic extensions over a towel roll, seated rows and seated scapular retraction (pull shoulder blades together like "T's") with band, child's pose stretch, self internal TP release (especially when  Pelvic floor spasms high), Every day: Pain diary, Posterior pelvic tilts with deep core activation and red band for obliques activation, bow-and-arrow, TENS unit 3+ times per day for 30 min and as needed if pain starts to increase. TENS at ASIS/PSIS at strong tingle for 1 hr. per day. Sleeper stretch PRN, vagal nerve toning    Consulted and Agree with Plan of Care Patient           Patient will benefit from skilled therapeutic intervention in order to improve the following deficits and impairments:     Visit Diagnosis: Other muscle spasm  Muscle weakness (generalized)  Difficulty in walking, not elsewhere classified     Problem List Patient Active Problem List   Diagnosis Date Noted  . Seizure ([place]) 11/11/2018  . Major depressive disorder, recurrent episode, moderate ([place]) 02/07/2018  . Nephrolithiasis 04/16/2016  . Numbness 07/28/2015  . Bladder retention 06/23/2015  . Abdominal pain 06/04/2015  . Dizziness 05/18/2015  . Neck pain 05/18/2015  . Complicated migraine 16/01/9603  . Other fatigue 04/28/2015  . Abnormal finding on MRI of brain 04/28/2015  . D (diarrhea) 03/29/2015  . H/O disease 03/29/2015  . Abnormal weight loss 03/29/2015  . Muscle weakness (generalized) 03/14/2015  . Headache, migraine 03/10/2015   Willa Rough DPT, ATC Willa Rough 05/28/2020, 2:32 PM  [place] [facility] SERVICES [address] [place], [place], [address] Phone: [phone]   Fax:  [phone]  Name: Christine Chavez MRN: [id number] Date of Birth: 1980/05/10

## 2020-05-31 ENCOUNTER — Other Ambulatory Visit: Payer: Self-pay

## 2020-05-31 ENCOUNTER — Ambulatory Visit: Payer: BC Managed Care – PPO

## 2020-05-31 DIAGNOSIS — M62838 Other muscle spasm: Secondary | ICD-10-CM | POA: Diagnosis not present

## 2020-05-31 DIAGNOSIS — R262 Difficulty in walking, not elsewhere classified: Secondary | ICD-10-CM

## 2020-05-31 DIAGNOSIS — M6281 Muscle weakness (generalized): Secondary | ICD-10-CM

## 2020-05-31 NOTE — Therapy (Signed)
[place] MAIN [facility] SERVICES [address] [place], [place], [address] Phone: [phone]   Fax:  [phone]  Physical Therapy Treatment  The patient has been informed of current processes in place at Outpatient Rehab to protect patients from Covid-19 exposure including social distancing, schedule modifications, and new cleaning procedures. After discussing their particular risk with a therapist based on the patient's personal risk factors, the patient has decided to proceed with in-person therapy.  Patient Details  Name: Christine Chavez MRN: [id number] Date of Birth: [birth year] No data recorded  Encounter Date: 05/31/2020   PT End of Session - 05/31/20 1036    Visit Number 42    Number of Visits 36    Date for PT Re-Evaluation 05/04/20    Authorization Type [employer]    Authorization Time Period from 04/15/20 through 06/24/20    Authorization - Visit Number 6    Authorization - Number of Visits 20    Progress Note Due on Visit 98    PT Start Time 0930    PT Stop Time 1030    PT Time Calculation (min) 60 min    Activity Tolerance Patient tolerated treatment well;No increased pain    Behavior During Therapy WFL for tasks assessed/performed           Past Medical History:  Diagnosis Date  . Complication of anesthesia    ? seizures after anesthesia   . Headache   . Migraines   . Neurogenic bladder   . Renal disorder   . Vision abnormalities     Past Surgical History:  Procedure Laterality Date  . ANTERIOR CRUCIATE LIGAMENT REPAIR  1997  . APPENDECTOMY    . COLONOSCOPY WITH PROPOFOL N/A 11/11/2018   Procedure: COLONOSCOPY WITH PROPOFOL;  Surgeon: [name], MD;  Location: [facility] ENDOSCOPY;  Service: Endoscopy;  Laterality: N/A;  . CYSTOSCOPY WITH STENT PLACEMENT Right 04/17/2016   Procedure: CYSTOSCOPY WITH STENT PLACEMENT;  Surgeon: [name], MD;  Location: [facility] ORS;  Service: Urology;  Laterality: Right;  .  ESOPHAGOGASTRODUODENOSCOPY (EGD) WITH PROPOFOL N/A 11/11/2018   Procedure: ESOPHAGOGASTRODUODENOSCOPY (EGD) WITH PROPOFOL;  Surgeon: [name], MD;  Location: [facility] ENDOSCOPY;  Service: Endoscopy;  Laterality: N/A;  . EXPLORATORY LAPAROTOMY  1999  . KIDNEY STONE SURGERY  04/2016  . REVISION UROSTOMY CUTANEOUS    . REVISION UROSTOMY CUTANEOUS  01/10/2018  . SUPRAPUBIC CATHETER PLACEMENT  08/2017  . TONSILLECTOMY      There were no vitals filed for this visit.  Pelvic Floor Physical Therapy Treatment Note  SCREENING  Changes in medications, allergies, or medical history?: She has a UTI and is considering inter-stim.  SUBJECTIVE  Patient reports: She had bad spasms at church and "got sick" from the pain of her bladder spasms even after taking an oxybutynin. Had to take a second one in the evening. Her back is still bothering her and the heartburn came back too. She did not eat a bunch of greasy/bad stuff. She is concerned because tomorrow will be a week since the nerve ablation. She has been working on her Neurosurgeon. Her TENS batter was dead so she was not able to use it for the game or any other time. She went to the basketball game for the kids but she was in a ton of pain by the time she got home. It felt like what we did helped but it was re-irritated. She did not sleep well Friday or Saturday because of  feeling heartburn/nausea.   Precautions:  Neurological diagnosis pending, has "non-specific" brain lesions   Pain update:  Location of pain: LB and neck/shoulders (bladder) Current pain:  5/10) (5/10) Max pain: 6/10 (8/10 when it spasms) Least pain:  3/10 (3/10) Nature of pain: sharp to dull ache (grabbing)  ** pain is 4/10 following treatment and Pt. Reports improved ease of breathing.  Patient Goals: Get rid of the discomfort with catheterizing and ache/bladder pain. Be able to take fewer medications and be able to enjoy spending time with her family again, doing  "all the mom things"    OBJECTIVE  Changes in:  Observations/Posture:  L lumbar curve prominent with transverse processes more prominent on the L. (from previous note)  C3 spinous process deviated L through C2-3. (from previous note)  12/30: L up-slip and anterior rotation **following treatment L iliacus remained tight, not allowing for complete up-slip or rotation correction.   Range of Motion/Flexibilty:  (from 03/27/19) Spine: ~ 2-3 fingers from knee with SB B. ~ 50% reduced R rotation with stretch in L, ~ 75% reduced L rotation with increased tightness. HS: able to achieve 90 deg. Hip flexion in seated HS stretch. (from prior treatment)  Decreased PIVM through Thoracic, lumbar, and sacral spine with greatest restriction at T3-6. (from prior session)  12/30:  no decreased mobility at the sacrum with attempt at manual correction for L anterior rotation, hip-flexor spasm keeping Pt. From approp   Strength/MMT:  LE MMT (from 03-27-19) LE MMT Left Right  Hip flex:  (L2) /5 /5  Hip ext: 3+/5 3+/5  Hip abd: 4/5 4/5  Hip add: 4/5 3+/5  Hip IR 4/5 3+/5  Hip ER 4/5 4/5   (From 3/24): Pt. Demonstrates 3/5 strength with "I's, Y's, and T's" but 4/5 with W's. (from 6/22) DF: 9 reps with B. Eversion/Inversion: 4/5 on R, 4+/5 on L. DF: L 4+/5, R 3+/5  Seated IR/ER on L: 4/5, R 3+/5 (from 8-5) L DF and Eversion 4+/5 with first 1-3 reps and fatigue decreased to 4/4. (9/9) Pt. Demonstrates ~ 4+/5 B with increased spasticity B.   Balance/stability:  (6-29) Pt. Able to achieve good posture without cueing but only able to maintain for ~ 2.5 min. Before seeking stability from the table and forward rolled shoulders. With cue to engage the deep core, Pt. Recognizes increased stability and comfort.  8/5: Pt. Demonstrating decreased stability in gait and standing balance today with fasciculations increasing in direct relation to muscular endurance required. Able to stand ~ 10 sec. In SLS with  MIN UE support before intensity of spasms make attempt at balance futile.   12/30: Pt. Reports feeling less stable on LLE again, similar to August but less intense.   1/31: Able to stand for ~ 10 sec. In double leg stance and on RLE with no LOB the first attempt but required TC and VC for L glute med. Activation for LLE. Second attempt less cueing recuired for LLE but far more fasiculations noted. Was able to stand with slow perterbations in multiple directions applied to the shoulders for ~ 20 sec. Before fatigue slowed reaction time.   Pelvic Floor External Exam: (From prior session)- [Introitus Appears: elevated Skin integrity: WNL Palpation: TTP to B STP Cough: limited motion Prolapse visible?: no Scar mobility: not assessed  Internal Vaginal Exam: Strength (PERF): Pt. Has difficulty sensing the PFM to generate contraction or relaxation due to high tone/neuro involvement Symmetry: L>R for spasm/TTP Palpation: TTP to all muscles throughout B  Prolapse: none]  unable to coordinate squeeze and TTP to all areas with the greatest at B OI and coccygeus. ** following TP release Pt. Able to achieve 1/5 PFM contraction. Decreased TTP at B anterior PR/PC following treatment. (10/14).  TODAY: not re-assessed today.  Abdominal:  Pt. Demonstrated ability to recruit R>L obliques and TA with pelvic tilts. (from prior session)  Palpation: Decreased fascial and scar mobility through lower abdomen with radiating Sx. To the pelvis and LB. (from prior session)  12/30: TTP to L QL and Iliacus  TODAY: TTP to B rectus abdominus and, diaphragm  Gait Analysis:  (04/22/20) 2 min. Walk test: 12 meters on 1st attempt. 7.75 meters on 2nd attempt. (video)  Pt. Demonstrated crouched walk with difficulty controlling where she placed the R>L LE and ~ 3-4 near LOB while walking on 2nd attempt with PT at [facility] and using Rollator  (Pt. Feels like this would have been better 2-3 days ago even)  2/3: Pt.  Walked ~ 50 ft. x2 With rollator and gait belt. Attempted to walk to metronome cadence but with difficulty, ended up keeping to ~ 1/2 time of the tempo with ~ 50% consistency. Some difficulty with control noted but less than during initial walk test.  INTERVENTIONS THIS SESSION: Manual: Performed TP release to B rectus abdominus and L diaphragm to decrease spasm and pain and allow for improved balance of musculature for improved function and decreased symptoms.  Dry-needle: Performed TPDN with a .30x29mm needle and standard approach as described below to decrease spasm and pain and allow for improved balance of musculature for improved function and decreased symptoms.   Total time: 60 min.                       Trigger Point Dry Needling - 05/31/20 0001    Consent Given? Yes    Education Handout Provided No    Muscles Treated Back/Hip Rectus abdominis    Dry Needling Comments Bilateral    Rectus abdominis Response Twitch response elicited;Palpable increased muscle length                  PT Short Term Goals - 04/15/20 1044      PT SHORT TERM GOAL #1   Title Patient will demonstrate coordinated diaphragmatic breathing with pelvic tilts to demonstrate improved control of diaphragm and TA, to allow for further strengthening of core musculature and decreased pelvic floor spasm.    Baseline Pt. demonstrates breathing dysfunction and poor PFM coordination evidenced by anal manometry As of 2/22: Pt. continues to have restriction in her diaphragm and near T/L junction but is able to intentionally use diaphragmatic breathing through the available ROM.    Time 5    Period Weeks    Status Achieved    Target Date 04/20/19      PT SHORT TERM GOAL #2   Title Patient will report a reduction in pain to no greater than 6/10 over the prior week to demonstrate symptom improvement.    Baseline Pain is 10/10 at worst, 2/10 at best As of 2/22: Pt. had achieved this goal for 1  week as of 1/21 but may have infection or other insult that caused pain to increase again. As of 4/29: Pain high of 8/10 but has been over-doing her activity over the past week. As of 6/10: Pt. was able to keep pain at 6 or below since prior visit by using TENS to help keep tension lower but  has not had a full week to test if it will keep working. As of 7/6: Pt. had met this goal but has a new UTI and it caused pain to spike to 8/10 over the weekend. As of 11/9: Pt. has just finally made progress with long-standing UTI and new POC for treatment so she is still having pain spikes up to 8/10 but we expect greater improvement over the next few months. As of 12/30: still feels like she is trending in a better direction but over-did it over the holidays and pain spiked to 8/10. No higher than 6/10 over last 2 days, even with PMS cramping.    Time 5    Period Weeks    Status On-going    Target Date 06/24/20      PT SHORT TERM GOAL #3   Title Patient will demonstrate HEP x1 in the clinic to demonstrate understanding and proper form to allow for further improvement.    Baseline Pt. lacks knowledge of therepeutic exercises that can decrease her pain/Sx.    Time 5    Period Weeks    Status Achieved    Target Date 04/20/19      PT SHORT TERM GOAL #4   Title Patient will report consistent use of foot-stool (squatty-potty) for positioning with BM to decrease pain with BM and intra-abdominal pressure.    Baseline Pt. having constipation due to PFM dysfunction    Time 5    Period Weeks    Status Achieved    Target Date 04/20/19             PT Long Term Goals - 04/23/20 0001      PT LONG TERM GOAL #1   Title Pt. will be able to participate in regular ADL's with least restrictive device without pain increasing greater than 2/10    Baseline Pt. limited in her ability to perform household duties by increased pain and fatigue. As of 4/29: Her legs have more endurance with walking before she gets a tremor.  Still needs to sit down the majority of the time she is cooking. Still needs back support for prolonged sitting. As of 6/10: Pt. able to do for  ~1 hour before she needs to rest to prevent increased pain, pain is averaging ~ 4-5/10    Time 10    Period Weeks    Status On-going    Target Date 06/24/20      PT LONG TERM GOAL #2   Title Patient will score at or below 65/300  on the PFDI and 35% on the Female NIH-CPSI to demonstrate a clinically meaningful decrease in disability and distress due to pelvic floor dysfunction.    Baseline PFDI: 110/300, Female NIH-CPSI: 29/43 (67%) As of 4/29: 100/300 on PFDI and 30/43 on NIH-CPSI As of 7/6:  NIH-CPSI: 32/43 POPDI: 61/100, CRADI-8: 29/100, UDI: 44/100 (PFDI: 149/300), As of 04/15/20: Female NIH-CPSI: 29/43, PFDI: 84/300    Time 10    Period Weeks    Status On-going    Target Date 06/24/20      PT LONG TERM GOAL #3   Title Patient will report no pain with intercourse to demonstrate improved functional ability.    Baseline Pt. Having significant pain with intercourse. As of 4/29: is a little better (20-30%) but is worse on days where overall tension/pain is worse. As of 6/10: can have days where there is not much pain but other days where there is significant pain. As of 11/9: same  as previous update. AS of 04/15/20: 25% improved from base-line    Time 10    Period Weeks    Status On-going    Target Date 06/24/20      PT LONG TERM GOAL #4   Title Pt will report ability to work in her yard for greater than 30 minutes without extreme fatigue    Baseline limited to 10-15 minutes before pt requires ending activity. As of 4/29: ~ 20 min. As of 6/10: has not been gardening much but can do ~ 30 min of similar indoor activity level before increased fatigue.    Time 10    Period Weeks    Status Achieved    Target Date 10/23/19      PT LONG TERM GOAL #5   Title Pt. will be able to go 2-3 hours between emptying her bladder without increased pain and empty  her bladder fully whether by urostomy, cath, or voluntary release.    Baseline Pt. has a urostomy but continues to have to self-catheterize. Has pain with bladder filling and when using catheter. As of 4/29: is doing better since starting to take the pregabalin andd baclofen but occasionally still has pain with bladder filling. It is bad when she has an infection or over-does activity, better on "normal" days which have been few and far between due to frequent UTI's. As of 6/10: Pt. has "good" and "bad" days but has more days where she can go 2-3 hours without increased pain. As of 04/15/20: able to wait 2 hours, not 3 yet    Time 10    Period Weeks    Status On-going    Target Date 06/24/20      PT LONG TERM GOAL #6   Title Patient will report having BM's at least every-other day with consistency between [facility] stool scale 3-5 over the prior week to demonstrate decreased constipation.    Baseline Pt. unable to have regula BM's without medication, manometry shows PFM dysfunction. As of 4/29: still having to use and enema or supository occasionally and still needing linzess. As of 6/10: Pt. has days where she can empty ok and days where she needs to use an Enema to stimulate a BM, feels that it is just the insertion of the enema, not the solution that does the job. As of 11/9: Pt. not having constipation due to colonic infection that has shifted her toward diarrhea. Having BM's daily, sometimes type 5. Has had diarrhea more than constipation, still had to take linzess 1 time to go after 2 days without a BM.    Time 10    Period Weeks    Status On-going    Target Date 06/24/20      PT LONG TERM GOAL #7   Title Pt. will drive to a meaningful activity to demonstrate increased function and participation    Baseline Pt. has not been able to drive due to decreased LE control    Time 10    Period Weeks    Status New    Target Date 06/24/20      PT LONG TERM GOAL #8   Title Pt. will be able to  perform 1 hr. of light housework reqiring intermittent standing and walking without increased pain/fatigue >2 pts.    Baseline Pt. becomes fatigued washing fruits/vegetbles in small stages and has fatigue>3 pts.    Time 10    Period Weeks    Status New    Target Date 06/24/20  PT LONG TERM GOAL  #9   TITLE Pt. will be able to attend a full athletic event for her children without Sx increasing by more than 2 pts.    Baseline Pt. having migraine or severe bladder spasms and fatigue and back pain> 3 pt. increase with attendance at athletic events    Time 20    Period Weeks    Status New    Target Date 09/10/20                 Plan - 05/31/20 1036    Clinical Impression Statement Pt. Responded well to all interventions today, demonstrating decreased spasm and TTP, improved ease of breathing, as well as understanding and correct performance of all education and exercises provided today. They will continue to benefit from skilled physical therapy to work toward remaining goals and maximize function as well as decrease likelihood of symptom increase or recurrence.    PT Next Visit Plan Follow up on driving progress, use balance game for 5 min., ask about stationary bike success/fatigue levels. Ask about taping/re-apply, further suprapubic scar work, deep-core, scapular tracking/strength, and balance, MFR to lower abdomen, Review pain diary, cupping to medial thighs and upper back, re-assess neural glides on LLE, focus on graded activity, and seated and standing balance/core strength. review I's Y's and T's in prone, thoracic mobility? perform further release and PNF for R>L shoulder blades, more scar-release and cupping at suprapubic scar, MFR around B ilium. continue to work on thoracolumbar rotation ROM and decreasing spasms, add SLS balance training, .    PT Home Exercise Plan A day: low back stretch, side-stretch, hamstring stretch, butterfly stretch, hip EXT in prone with knee bent,  standing hip ABD/EXT, calf raises. B day: Chest-stretch, Thoracic extensions over a towel roll, seated rows and seated scapular retraction (pull shoulder blades together like "T's") with band, child's pose stretch, self internal TP release (especially when Pelvic floor spasms high), Every day: Pain diary, Posterior pelvic tilts with deep core activation and red band for obliques activation, bow-and-arrow, TENS unit 3+ times per day for 30 min and as needed if pain starts to increase. TENS at ASIS/PSIS at strong tingle for 1 hr. per day. Sleeper stretch PRN, vagal nerve toning    Consulted and Agree with Plan of Care Patient           Patient will benefit from skilled therapeutic intervention in order to improve the following deficits and impairments:     Visit Diagnosis: Other muscle spasm  Muscle weakness (generalized)  Difficulty in walking, not elsewhere classified     Problem List Patient Active Problem List   Diagnosis Date Noted  . Seizure ([place]) 11/11/2018  . Major depressive disorder, recurrent episode, moderate ([place]) 02/07/2018  . Nephrolithiasis 04/16/2016  . Numbness 07/28/2015  . Bladder retention 06/23/2015  . Abdominal pain 06/04/2015  . Dizziness 05/18/2015  . Neck pain 05/18/2015  . Complicated migraine [id number]/7341  . Other fatigue 04/28/2015  . Abnormal finding on MRI of brain 04/28/2015  . D (diarrhea) 03/29/2015  . H/O disease 03/29/2015  . Abnormal weight loss 03/29/2015  . Muscle weakness (generalized) 03/14/2015  . Headache, migraine 03/10/2015   Willa Rough DPT, ATC Willa Rough 05/31/2020, 10:38 AM  [place] MAIN [facility] SERVICES [address] [place], [place], [address] Phone: [phone]   Fax:  [phone]  Name: Christine Chavez MRN: [id number] Date of Birth: [birth year]

## 2020-06-03 ENCOUNTER — Ambulatory Visit: Payer: BC Managed Care – PPO

## 2020-06-03 ENCOUNTER — Other Ambulatory Visit: Payer: Self-pay

## 2020-06-03 DIAGNOSIS — R262 Difficulty in walking, not elsewhere classified: Secondary | ICD-10-CM

## 2020-06-03 DIAGNOSIS — M62838 Other muscle spasm: Secondary | ICD-10-CM

## 2020-06-03 DIAGNOSIS — M6281 Muscle weakness (generalized): Secondary | ICD-10-CM

## 2020-06-03 NOTE — Therapy (Signed)
[place] MAIN [facility] SERVICES [address] [place], [place], [address] Phone: [phone]   Fax:  [phone]  Physical Therapy Treatment  The patient has been informed of current processes in place at Outpatient Rehab to protect patients from Covid-19 exposure including social distancing, schedule modifications, and new cleaning procedures. After discussing their particular risk with a therapist based on the patient's personal risk factors, the patient has decided to proceed with in-person therapy.  Patient Details  Name: Christine Chavez MRN: [id number] Date of Birth: 10-Nov-1980 No data recorded  Encounter Date: 06/03/2020   PT End of Session - 06/03/20 1123    Visit Number 6    Number of Visits 60    Date for PT Re-Evaluation 05/04/20    Authorization Type BCBS    Authorization Time Period from 04/15/20 through 06/24/20    Authorization - Visit Number 7    Authorization - Number of Visits 20    Progress Note Due on Visit 98    PT Start Time 1030    PT Stop Time 1130    PT Time Calculation (min) 60 min    Activity Tolerance Patient tolerated treatment well;No increased pain    Behavior During Therapy WFL for tasks assessed/performed           Past Medical History:  Diagnosis Date  . Complication of anesthesia    ? seizures after anesthesia   . Headache   . Migraines   . Neurogenic bladder   . Renal disorder   . Vision abnormalities     Past Surgical History:  Procedure Laterality Date  . ANTERIOR CRUCIATE LIGAMENT REPAIR  1997  . APPENDECTOMY    . COLONOSCOPY WITH PROPOFOL N/A 11/11/2018   Procedure: COLONOSCOPY WITH PROPOFOL;  Surgeon: [name], MD;  Location: [facility] ENDOSCOPY;  Service: Endoscopy;  Laterality: N/A;  . CYSTOSCOPY WITH STENT PLACEMENT Right 04/17/2016   Procedure: CYSTOSCOPY WITH STENT PLACEMENT;  Surgeon: [name], MD;  Location: [facility] ORS;  Service: Urology;  Laterality: Right;  .  ESOPHAGOGASTRODUODENOSCOPY (EGD) WITH PROPOFOL N/A 11/11/2018   Procedure: ESOPHAGOGASTRODUODENOSCOPY (EGD) WITH PROPOFOL;  Surgeon: [name], MD;  Location: [facility] ENDOSCOPY;  Service: Endoscopy;  Laterality: N/A;  . EXPLORATORY LAPAROTOMY  1999  . KIDNEY STONE SURGERY  04/2016  . REVISION UROSTOMY CUTANEOUS    . REVISION UROSTOMY CUTANEOUS  01/10/2018  . SUPRAPUBIC CATHETER PLACEMENT  08/2017  . TONSILLECTOMY      There were no vitals filed for this visit.  Pelvic Floor Physical Therapy Treatment Note  SCREENING  Changes in medications, allergies, or medical history?: She has a UTI and is considering inter-stim.  SUBJECTIVE  Patient reports: The bladder spasms have decreased some but did increase from being at the games on Tuesday. Rested a lot yesterday. Her back has been hurting a lot. The heat helped relax it some. Has a rash on her face and read that the Hiprex could potentially cause a rash and LBP. She had to reschedule her GI appointment due to transportation issues. She was in the bathroom all dy yesterday and was feeling depressed. She is constipated if she does not take the linzess but has diarrhea if she does. She has an appointment with neuro/ Dr. Werner Lean to see if they will try to treat her for MS based on Sx. to see if it makes a difference since the diagnoisis is unclear.   Precautions:  Neurological diagnosis pending, has "non-specific" brain lesions  Pain update:  Location of pain: LB and neck/shoulders (bladder) Current pain:  5/10) (4/10) Max pain: 8/10 (6/10 when it spasms) Least pain:  3/10 (3/10) Nature of pain: sharp to dull ache (grabbing)  ** pain is 4/10 following treatment and Pt. Reports improved ease of breathing.  Patient Goals: Get rid of the discomfort with catheterizing and ache/bladder pain. Be able to take fewer medications and be able to enjoy spending time with her family again, doing "all the mom things"    OBJECTIVE  Changes  in:  Observations/Posture:  L lumbar curve prominent with transverse processes more prominent on the L. (from previous note)  C3 spinous process deviated L through C2-3. (from previous note)  12/30: L up-slip and anterior rotation **following treatment L iliacus remained tight, not allowing for complete up-slip or rotation correction.   Range of Motion/Flexibilty:  (from 03/27/19) Spine: ~ 2-3 fingers from knee with SB B. ~ 50% reduced R rotation with stretch in L, ~ 75% reduced L rotation with increased tightness. HS: able to achieve 90 deg. Hip flexion in seated HS stretch. (from prior treatment)  Decreased PIVM through Thoracic, lumbar, and sacral spine with greatest restriction at T3-6. (from prior session)  12/30:  no decreased mobility at the sacrum with attempt at manual correction for L anterior rotation, hip-flexor spasm keeping Pt. From approp   Strength/MMT:  LE MMT (from 03-27-19) LE MMT Left Right  Hip flex:  (L2) /5 /5  Hip ext: 3+/5 3+/5  Hip abd: 4/5 4/5  Hip add: 4/5 3+/5  Hip IR 4/5 3+/5  Hip ER 4/5 4/5   (From 3/24): Pt. Demonstrates 3/5 strength with "I's, Y's, and T's" but 4/5 with W's. (from 6/22) DF: 9 reps with B. Eversion/Inversion: 4/5 on R, 4+/5 on L. DF: L 4+/5, R 3+/5  Seated IR/ER on L: 4/5, R 3+/5 (from 8-5) L DF and Eversion 4+/5 with first 1-3 reps and fatigue decreased to 4/4. (9/9) Pt. Demonstrates ~ 4+/5 B with increased spasticity B.   Balance/stability:  (6-29) Pt. Able to achieve good posture without cueing but only able to maintain for ~ 2.5 min. Before seeking stability from the table and forward rolled shoulders. With cue to engage the deep core, Pt. Recognizes increased stability and comfort.  8/5: Pt. Demonstrating decreased stability in gait and standing balance today with fasciculations increasing in direct relation to muscular endurance required. Able to stand ~ 10 sec. In SLS with MIN UE support before intensity of spasms make  attempt at balance futile.   12/30: Pt. Reports feeling less stable on LLE again, similar to August but less intense.   1/31: Able to stand for ~ 10 sec. In double leg stance and on RLE with no LOB the first attempt but required TC and VC for L glute med. Activation for LLE. Second attempt less cueing recuired for LLE but far more fasiculations noted. Was able to stand with slow perterbations in multiple directions applied to the shoulders for ~ 20 sec. Before fatigue slowed reaction time.  2/17: performed circle maze and square maze on balance machine with BUE support. Circle: 134 sec. -4 pts. Square: 137 sec. And -17 pts.   Pelvic Floor External Exam: (From prior session)- [Introitus Appears: elevated Skin integrity: WNL Palpation: TTP to B STP Cough: limited motion Prolapse visible?: no Scar mobility: not assessed  Internal Vaginal Exam: Strength (PERF): Pt. Has difficulty sensing the PFM to generate contraction or relaxation due to high tone/neuro involvement Symmetry:  L>R for spasm/TTP Palpation: TTP to all muscles throughout B Prolapse: none]  unable to coordinate squeeze and TTP to all areas with the greatest at B OI and coccygeus. ** following TP release Pt. Able to achieve 1/5 PFM contraction. Decreased TTP at B anterior PR/PC following treatment. (10/14).  TODAY: not re-assessed today.  Abdominal:  Pt. Demonstrated ability to recruit R>L obliques and TA with pelvic tilts. (from prior session)  Palpation: Decreased fascial and scar mobility through lower abdomen with radiating Sx. To the pelvis and LB. (from prior session)  12/30: TTP to L QL and Iliacus  TODAY: TTP to B erector spinae and multifidus from T12-L5.   Murphy's punch sign: Some sharp pain that shot forward with percussion to B kidneys.   Gait Analysis:  (04/22/20) 2 min. Walk test: 12 meters on 1st attempt. 7.75 meters on 2nd attempt. (video)  Pt. Demonstrated crouched walk with difficulty  controlling where she placed the R>L LE and ~ 3-4 near LOB while walking on 2nd attempt with PT at [facility] and using Rollator  (Pt. Feels like this would have been better 2-3 days ago even)  2/3: Pt. Walked ~ 50 ft. x2 With rollator and gait belt. Attempted to walk to metronome cadence but with difficulty, ended up keeping to ~ 1/2 time of the tempo with ~ 50% consistency. Some difficulty with control noted but less than during initial walk test.  INTERVENTIONS THIS SESSION: Manual: Performed TP release to B erector spinae and multifidus through T12-L5 to decrease spasm and pain and allow for improved balance of musculature for improved function and decreased symptoms. Performed Murphy's punch test to try and establish support for whether kidney's are/are not involved in LBP.  Dry-needle: Performed TPDN with four .30x60mm needlse and standard approach in conjunction with E-stim as described below to decrease spasm and pain and allow for improved balance of musculature for improved function and decreased symptoms.  Self-care: Discussed trying to use Mirialx as an alternative to linzess due to current over-reaction to linzess. She has an appointment with neuro with Dr. Werner Lean to see if they will try to treat her for MS and see if it makes a difference since the diagnoisis is unclear.  Balance: Pt. Played circle and square maze games on balance machine to introduce the activity and set a base-line for her ability to allow for external-focused improvement in standing balance and endurance.  E-stim: Performed supervised e-stim with TPDN to help decrease spasms through the LB and take pressure off of nerve-roots leading to the PFM to try and effect BM and bladder function as well as rule in/out whether the kidney's may be Involved.   Total time: 60 min.                      Trigger Point Dry Needling - 06/03/20 0001    Consent Given? Yes    Education Handout Provided No    Muscles  Treated Back/Hip Erector spinae;Lumbar multifidi;Thoracic multifidi    Dry Needling Comments Bilateral    Electrical Stimulation Performed with Dry Needling Yes    Erector spinae Response Twitch response elicited;Palpable increased muscle length    Lumbar multifidi Response Twitch response elicited;Palpable increased muscle length    Thoracic multifidi response Twitch response elicited;Palpable increased muscle length                  PT Short Term Goals - 04/15/20 1044      PT SHORT TERM GOAL #  1   Title Patient will demonstrate coordinated diaphragmatic breathing with pelvic tilts to demonstrate improved control of diaphragm and TA, to allow for further strengthening of core musculature and decreased pelvic floor spasm.    Baseline Pt. demonstrates breathing dysfunction and poor PFM coordination evidenced by anal manometry As of 2/22: Pt. continues to have restriction in her diaphragm and near T/L junction but is able to intentionally use diaphragmatic breathing through the available ROM.    Time 5    Period Weeks    Status Achieved    Target Date 04/20/19      PT SHORT TERM GOAL #2   Title Patient will report a reduction in pain to no greater than 6/10 over the prior week to demonstrate symptom improvement.    Baseline Pain is 10/10 at worst, 2/10 at best As of 2/22: Pt. had achieved this goal for 1 week as of 1/21 but may have infection or other insult that caused pain to increase again. As of 4/29: Pain high of 8/10 but has been over-doing her activity over the past week. As of 6/10: Pt. was able to keep pain at 6 or below since prior visit by using TENS to help keep tension lower but has not had a full week to test if it will keep working. As of 7/6: Pt. had met this goal but has a new UTI and it caused pain to spike to 8/10 over the weekend. As of 11/9: Pt. has just finally made progress with long-standing UTI and new POC for treatment so she is still having pain spikes up to 8/10  but we expect greater improvement over the next few months. As of 12/30: still feels like she is trending in a better direction but over-did it over the holidays and pain spiked to 8/10. No higher than 6/10 over last 2 days, even with PMS cramping.    Time 5    Period Weeks    Status On-going    Target Date 06/24/20      PT SHORT TERM GOAL #3   Title Patient will demonstrate HEP x1 in the clinic to demonstrate understanding and proper form to allow for further improvement.    Baseline Pt. lacks knowledge of therepeutic exercises that can decrease her pain/Sx.    Time 5    Period Weeks    Status Achieved    Target Date 04/20/19      PT SHORT TERM GOAL #4   Title Patient will report consistent use of foot-stool (squatty-potty) for positioning with BM to decrease pain with BM and intra-abdominal pressure.    Baseline Pt. having constipation due to PFM dysfunction    Time 5    Period Weeks    Status Achieved    Target Date 04/20/19             PT Long Term Goals - 04/23/20 0001      PT LONG TERM GOAL #1   Title Pt. will be able to participate in regular ADL's with least restrictive device without pain increasing greater than 2/10    Baseline Pt. limited in her ability to perform household duties by increased pain and fatigue. As of 4/29: Her legs have more endurance with walking before she gets a tremor. Still needs to sit down the majority of the time she is cooking. Still needs back support for prolonged sitting. As of 6/10: Pt. able to do for  ~1 hour before she needs to rest to prevent increased  pain, pain is averaging ~ 4-5/10    Time 10    Period Weeks    Status On-going    Target Date 06/24/20      PT LONG TERM GOAL #2   Title Patient will score at or below 65/300  on the PFDI and 35% on the Female NIH-CPSI to demonstrate a clinically meaningful decrease in disability and distress due to pelvic floor dysfunction.    Baseline PFDI: 110/300, Female NIH-CPSI: 29/43 (67%) As of  4/29: 100/300 on PFDI and 30/43 on NIH-CPSI As of 7/6:  NIH-CPSI: 32/43 POPDI: 61/100, CRADI-8: 29/100, UDI: 44/100 (PFDI: 149/300), As of 04/15/20: Female NIH-CPSI: 29/43, PFDI: 84/300    Time 10    Period Weeks    Status On-going    Target Date 06/24/20      PT LONG TERM GOAL #3   Title Patient will report no pain with intercourse to demonstrate improved functional ability.    Baseline Pt. Having significant pain with intercourse. As of 4/29: is a little better (20-30%) but is worse on days where overall tension/pain is worse. As of 6/10: can have days where there is not much pain but other days where there is significant pain. As of 11/9: same as previous update. AS of 04/15/20: 25% improved from base-line    Time 10    Period Weeks    Status On-going    Target Date 06/24/20      PT LONG TERM GOAL #4   Title Pt will report ability to work in her yard for greater than 30 minutes without extreme fatigue    Baseline limited to 10-15 minutes before pt requires ending activity. As of 4/29: ~ 20 min. As of 6/10: has not been gardening much but can do ~ 30 min of similar indoor activity level before increased fatigue.    Time 10    Period Weeks    Status Achieved    Target Date 10/23/19      PT LONG TERM GOAL #5   Title Pt. will be able to go 2-3 hours between emptying her bladder without increased pain and empty her bladder fully whether by urostomy, cath, or voluntary release.    Baseline Pt. has a urostomy but continues to have to self-catheterize. Has pain with bladder filling and when using catheter. As of 4/29: is doing better since starting to take the pregabalin andd baclofen but occasionally still has pain with bladder filling. It is bad when she has an infection or over-does activity, better on "normal" days which have been few and far between due to frequent UTI's. As of 6/10: Pt. has "good" and "bad" days but has more days where she can go 2-3 hours without increased pain. As of  04/15/20: able to wait 2 hours, not 3 yet    Time 10    Period Weeks    Status On-going    Target Date 06/24/20      PT LONG TERM GOAL #6   Title Patient will report having BM's at least every-other day with consistency between [facility] stool scale 3-5 over the prior week to demonstrate decreased constipation.    Baseline Pt. unable to have regula BM's without medication, manometry shows PFM dysfunction. As of 4/29: still having to use and enema or supository occasionally and still needing linzess. As of 6/10: Pt. has days where she can empty ok and days where she needs to use an Enema to stimulate a BM, feels that it is just  the insertion of the enema, not the solution that does the job. As of 11/9: Pt. not having constipation due to colonic infection that has shifted her toward diarrhea. Having BM's daily, sometimes type 5. Has had diarrhea more than constipation, still had to take linzess 1 time to go after 2 days without a BM.    Time 10    Period Weeks    Status On-going    Target Date 06/24/20      PT LONG TERM GOAL #7   Title Pt. will drive to a meaningful activity to demonstrate increased function and participation    Baseline Pt. has not been able to drive due to decreased LE control    Time 10    Period Weeks    Status New    Target Date 06/24/20      PT LONG TERM GOAL #8   Title Pt. will be able to perform 1 hr. of light housework reqiring intermittent standing and walking without increased pain/fatigue >2 pts.    Baseline Pt. becomes fatigued washing fruits/vegetbles in small stages and has fatigue>3 pts.    Time 10    Period Weeks    Status New    Target Date 06/24/20      PT LONG TERM GOAL  #9   TITLE Pt. will be able to attend a full athletic event for her children without Sx increasing by more than 2 pts.    Baseline Pt. having migraine or severe bladder spasms and fatigue and back pain> 3 pt. increase with attendance at athletic events    Time 20    Period Weeks     Status New    Target Date 09/10/20                 Plan - 06/03/20 1124    Clinical Impression Statement Pt. reports some decrease in bladder spasm/pain since prior visit which may indicate that the nerve ablation has started to take effect however she is concerned today because she has a rash on her face and has consistently had back pain and worries that it may be a side-effect of the hiprex she has been taking to try to prevent UTI's. I sought to help determine whether her back pain may be (at least in part) coming from Kidney malfunction by performing a percussion (Murphy's Punch) test which was positive for sharp pain as well as trying to decreased spasm and pain via E-stim combined with dry-needling to the multifidus and erector spinae through the lumbar spine and pain was not decreased during or after treatment as would be expected and has been seen in the past. It seems possible that the Pt. may be having a side-effect to this medication so she was instructed to reach out to both her PCP and prescribing physicians for advise. She responded well to balance training and was able to participate for 5 min. straight with moderate fatigue as a result. She will continue to benefit from skilled pelvic health PT as we continue to work toward increased function and decreased Sx.    PT Next Visit Plan use balance game for 5 min., ask about stationary bike success/fatigue levels. Ask about taping/re-apply, further suprapubic scar work, deep-core, scapular tracking/strength, and balance, MFR to lower abdomen, Review pain diary, cupping to medial thighs and upper back, re-assess neural glides on LLE, focus on graded activity, and seated and standing balance/core strength. review I's Y's and T's in prone, thoracic mobility? perform further release and  PNF for R>L shoulder blades, MFR around B ilium. continue to work on thoracolumbar rotation ROM and decreasing spasms    PT Home Exercise Plan A day: low back  stretch, side-stretch, hamstring stretch, butterfly stretch, hip EXT in prone with knee bent, standing hip ABD/EXT, calf raises. B day: Chest-stretch, Thoracic extensions over a towel roll, seated rows and seated scapular retraction (pull shoulder blades together like "T's") with band, child's pose stretch, self internal TP release (especially when Pelvic floor spasms high), Every day: Pain diary, Posterior pelvic tilts with deep core activation and red band for obliques activation, bow-and-arrow, TENS unit 3+ times per day for 30 min and as needed if pain starts to increase. TENS at ASIS/PSIS at strong tingle for 1 hr. per day. Sleeper stretch PRN, vagal nerve toning    Consulted and Agree with Plan of Care Patient           Patient will benefit from skilled therapeutic intervention in order to improve the following deficits and impairments:     Visit Diagnosis: Other muscle spasm  Muscle weakness (generalized)  Difficulty in walking, not elsewhere classified     Problem List Patient Active Problem List   Diagnosis Date Noted  . Seizure ([place]) 11/11/2018  . Major depressive disorder, recurrent episode, moderate ([place]) 02/07/2018  . Nephrolithiasis 04/16/2016  . Numbness 07/28/2015  . Bladder retention 06/23/2015  . Abdominal pain 06/04/2015  . Dizziness 05/18/2015  . Neck pain 05/18/2015  . Complicated migraine 45/40/9811  . Other fatigue 04/28/2015  . Abnormal finding on MRI of brain 04/28/2015  . D (diarrhea) 03/29/2015  . H/O disease 03/29/2015  . Abnormal weight loss 03/29/2015  . Muscle weakness (generalized) 03/14/2015  . Headache, migraine 03/10/2015   Willa Rough DPT, ATC Willa Rough 06/03/2020, 12:41 PM  [place] MAIN [facility] SERVICES [address] [place], [place], [address] Phone: [phone]   Fax:  [phone]  Name: Abbigal Radich MRN: [id number] Date of Birth: [birth year]

## 2020-06-07 ENCOUNTER — Ambulatory Visit: Payer: BC Managed Care – PPO

## 2020-06-10 ENCOUNTER — Other Ambulatory Visit: Payer: Self-pay

## 2020-06-10 ENCOUNTER — Ambulatory Visit: Payer: BC Managed Care – PPO

## 2020-06-10 DIAGNOSIS — M62838 Other muscle spasm: Secondary | ICD-10-CM

## 2020-06-10 DIAGNOSIS — R262 Difficulty in walking, not elsewhere classified: Secondary | ICD-10-CM

## 2020-06-10 DIAGNOSIS — M6281 Muscle weakness (generalized): Secondary | ICD-10-CM

## 2020-06-10 NOTE — Therapy (Signed)
[place] MAIN [facility] SERVICES [address] [place], [place], [address] Phone: [phone]   Fax:  [phone]  Physical Therapy Treatment  The patient has been informed of current processes in place at Outpatient Rehab to protect patients from Covid-19 exposure including social distancing, schedule modifications, and new cleaning procedures. After discussing their particular risk with a therapist based on the patient's personal risk factors, the patient has decided to proceed with in-person therapy.  Patient Details  Name: Christine Chavez MRN: [id number] Date of Birth: 1980/11/15 No data recorded  Encounter Date: 06/10/2020   PT End of Session - 06/10/20 1201    Visit Number 64    Number of Visits 18    Date for PT Re-Evaluation 05/04/20    Authorization Type [employer]    Authorization Time Period from 04/15/20 through 06/24/20    Authorization - Visit Number 8    Authorization - Number of Visits 20    Progress Note Due on Visit 98    PT Start Time 0102    PT Stop Time 1135    PT Time Calculation (min) 60 min    Activity Tolerance Patient tolerated treatment well;No increased pain    Behavior During Therapy WFL for tasks assessed/performed           Past Medical History:  Diagnosis Date  . Complication of anesthesia    ? seizures after anesthesia   . Headache   . Migraines   . Neurogenic bladder   . Renal disorder   . Vision abnormalities     Past Surgical History:  Procedure Laterality Date  . ANTERIOR CRUCIATE LIGAMENT REPAIR  1997  . APPENDECTOMY    . COLONOSCOPY WITH PROPOFOL N/A 11/11/2018   Procedure: COLONOSCOPY WITH PROPOFOL;  Surgeon: [name], MD;  Location: [facility] ENDOSCOPY;  Service: Endoscopy;  Laterality: N/A;  . CYSTOSCOPY WITH STENT PLACEMENT Right 04/17/2016   Procedure: CYSTOSCOPY WITH STENT PLACEMENT;  Surgeon: [name], MD;  Location: [facility] ORS;  Service: Urology;  Laterality: Right;  .  ESOPHAGOGASTRODUODENOSCOPY (EGD) WITH PROPOFOL N/A 11/11/2018   Procedure: ESOPHAGOGASTRODUODENOSCOPY (EGD) WITH PROPOFOL;  Surgeon: [name], MD;  Location: [facility] ENDOSCOPY;  Service: Endoscopy;  Laterality: N/A;  . EXPLORATORY LAPAROTOMY  1999  . KIDNEY STONE SURGERY  04/2016  . REVISION UROSTOMY CUTANEOUS    . REVISION UROSTOMY CUTANEOUS  01/10/2018  . SUPRAPUBIC CATHETER PLACEMENT  08/2017  . TONSILLECTOMY      There were no vitals filed for this visit.  Pelvic Floor Physical Therapy Treatment Note  SCREENING  Changes in medications, allergies, or medical history?: She has a UTI and is considering inter-stim.  SUBJECTIVE  Patient reports: She does not have a UTI, she is going to have a referral for rheumatology and she has stopped the Hiprex and will get a kidney scan and some labs just to see what is happening with her levels. Her back has been in "the biggest mess" and her R hip has been feeling like it comes out of alignment and gets "stuck" and she was having to drag her leg for a few minutes until it "went back into place". The bladder spasms have been a lot better.   Precautions:  Neurological diagnosis pending, has "non-specific" brain lesions   Pain update:  Location of pain: LB and neck/shoulders (bladder) Current pain:  5/10) (3/10) Max pain: 8/10 (3/10 when it spasms) Least pain:  3/10 (3/10) Nature of pain: sharp to dull ache (  grabbing)  ** Pt. Reports less TTP and improved stability with standing after treatment.  Patient Goals: Get rid of the discomfort with catheterizing and ache/bladder pain. Be able to take fewer medications and be able to enjoy spending time with her family again, doing "all the mom things"    OBJECTIVE  Changes in:  Observations/Posture:  L lumbar curve prominent with transverse processes more prominent on the L. (from previous note)  C3 spinous process deviated L through C2-3. (from previous note)  12/30: L up-slip and  anterior rotation **following treatment L iliacus remained tight, not allowing for complete up-slip or rotation correction.  TODAY: R up-slip and anterior rotation.  **following treatment, Ankles and ASIS level in supine.   Range of Motion/Flexibilty:  (from 03/27/19) Spine: ~ 2-3 fingers from knee with SB B. ~ 50% reduced R rotation with stretch in L, ~ 75% reduced L rotation with increased tightness. HS: able to achieve 90 deg. Hip flexion in seated HS stretch. (from prior treatment)  Decreased PIVM through Thoracic, lumbar, and sacral spine with greatest restriction at T3-6. (from prior session)  12/30:  no decreased mobility at the sacrum with attempt at manual correction for L anterior rotation, hip-flexor spasm keeping Pt. From approp   Strength/MMT:  LE MMT (from 03-27-19) LE MMT Left Right  Hip flex:  (L2) /5 /5  Hip ext: 3+/5 3+/5  Hip abd: 4/5 4/5  Hip add: 4/5 3+/5  Hip IR 4/5 3+/5  Hip ER 4/5 4/5   (From 3/24): Pt. Demonstrates 3/5 strength with "I's, Y's, and T's" but 4/5 with W's. (from 6/22) DF: 9 reps with B. Eversion/Inversion: 4/5 on R, 4+/5 on L. DF: L 4+/5, R 3+/5  Seated IR/ER on L: 4/5, R 3+/5 (from 8-5) L DF and Eversion 4+/5 with first 1-3 reps and fatigue decreased to 4/4. (9/9) Pt. Demonstrates ~ 4+/5 B with increased spasticity B.   Balance/stability:  (6-29) Pt. Able to achieve good posture without cueing but only able to maintain for ~ 2.5 min. Before seeking stability from the table and forward rolled shoulders. With cue to engage the deep core, Pt. Recognizes increased stability and comfort.  8/5: Pt. Demonstrating decreased stability in gait and standing balance today with fasciculations increasing in direct relation to muscular endurance required. Able to stand ~ 10 sec. In SLS with MIN UE support before intensity of spasms make attempt at balance futile.   12/30: Pt. Reports feeling less stable on LLE again, similar to August but less intense.    1/31: Able to stand for ~ 10 sec. In double leg stance and on RLE with no LOB the first attempt but required TC and VC for L glute med. Activation for LLE. Second attempt less cueing recuired for LLE but far more fasiculations noted. Was able to stand with slow perterbations in multiple directions applied to the shoulders for ~ 20 sec. Before fatigue slowed reaction time.  2/17: performed circle maze and square maze on balance machine with BUE support. Circle: 134 sec. -4 pts. Square: 137 sec. And -17 pts.   Pelvic Floor External Exam: (From prior session)- [Introitus Appears: elevated Skin integrity: WNL Palpation: TTP to B STP Cough: limited motion Prolapse visible?: no Scar mobility: not assessed  Internal Vaginal Exam: Strength (PERF): Pt. Has difficulty sensing the PFM to generate contraction or relaxation due to high tone/neuro involvement Symmetry: L>R for spasm/TTP Palpation: TTP to all muscles throughout B Prolapse: none]  unable to coordinate squeeze and TTP  to all areas with the greatest at B OI and coccygeus. ** following TP release Pt. Able to achieve 1/5 PFM contraction. Decreased TTP at B anterior PR/PC following treatment. (10/14).  TODAY: not re-assessed today.  Abdominal:  Pt. Demonstrated ability to recruit R>L obliques and TA with pelvic tilts. (from prior session)  Palpation: Decreased fascial and scar mobility through lower abdomen with radiating Sx. To the pelvis and LB. (from prior session)  12/30: TTP to L QL and Iliacus  TODAY: TTP to R Iliacus and QL   Murphy's punch sign: Some sharp pain that shot forward with percussion to B kidneys. (from 06/03/20)  Gait Analysis:  (04/22/20) 2 min. Walk test: 12 meters on 1st attempt. 7.75 meters on 2nd attempt. (video)  Pt. Demonstrated crouched walk with difficulty controlling where she placed the R>L LE and ~ 3-4 near LOB while walking on 2nd attempt with PT at [facility] and using Rollator  (Pt. Feels like  this would have been better 2-3 days ago even)  2/3: Pt. Walked ~ 50 ft. x2 With rollator and gait belt. Attempted to walk to metronome cadence but with difficulty, ended up keeping to ~ 1/2 time of the tempo with ~ 50% consistency. Some difficulty with control noted but less than during initial walk test.  INTERVENTIONS THIS SESSION: Manual: Performed TP release and STM to  R QL and Iliacus to decrease spasm and pain and allow for improved balance of musculature for improved function and decreased symptoms.  Dry-needle: Performed TPDN with a .30x114m needle and standard approach as described below to decrease spasm and pain and allow for improved balance of musculature for improved function and decreased symptoms.  Therex: Reviewed side-stretch, hip-flexor stretch and standing hip ABD To maintain and improve muscle length and allow for improved balance of musculature for long-term symptom relief.  Total time: 60 min.                                 PT Short Term Goals - 04/15/20 1044      PT SHORT TERM GOAL #1   Title Patient will demonstrate coordinated diaphragmatic breathing with pelvic tilts to demonstrate improved control of diaphragm and TA, to allow for further strengthening of core musculature and decreased pelvic floor spasm.    Baseline Pt. demonstrates breathing dysfunction and poor PFM coordination evidenced by anal manometry As of 2/22: Pt. continues to have restriction in her diaphragm and near T/L junction but is able to intentionally use diaphragmatic breathing through the available ROM.    Time 5    Period Weeks    Status Achieved    Target Date 04/20/19      PT SHORT TERM GOAL #2   Title Patient will report a reduction in pain to no greater than 6/10 over the prior week to demonstrate symptom improvement.    Baseline Pain is 10/10 at worst, 2/10 at best As of 2/22: Pt. had achieved this goal for 1 week as of 1/21 but may have infection or  other insult that caused pain to increase again. As of 4/29: Pain high of 8/10 but has been over-doing her activity over the past week. As of 6/10: Pt. was able to keep pain at 6 or below since prior visit by using TENS to help keep tension lower but has not had a full week to test if it will keep working. As of 7/6: Pt. had met  this goal but has a new UTI and it caused pain to spike to 8/10 over the weekend. As of 11/9: Pt. has just finally made progress with long-standing UTI and new POC for treatment so she is still having pain spikes up to 8/10 but we expect greater improvement over the next few months. As of 12/30: still feels like she is trending in a better direction but over-did it over the holidays and pain spiked to 8/10. No higher than 6/10 over last 2 days, even with PMS cramping.    Time 5    Period Weeks    Status On-going    Target Date 06/24/20      PT SHORT TERM GOAL #3   Title Patient will demonstrate HEP x1 in the clinic to demonstrate understanding and proper form to allow for further improvement.    Baseline Pt. lacks knowledge of therepeutic exercises that can decrease her pain/Sx.    Time 5    Period Weeks    Status Achieved    Target Date 04/20/19      PT SHORT TERM GOAL #4   Title Patient will report consistent use of foot-stool (squatty-potty) for positioning with BM to decrease pain with BM and intra-abdominal pressure.    Baseline Pt. having constipation due to PFM dysfunction    Time 5    Period Weeks    Status Achieved    Target Date 04/20/19             PT Long Term Goals - 04/23/20 0001      PT LONG TERM GOAL #1   Title Pt. will be able to participate in regular ADL's with least restrictive device without pain increasing greater than 2/10    Baseline Pt. limited in her ability to perform household duties by increased pain and fatigue. As of 4/29: Her legs have more endurance with walking before she gets a tremor. Still needs to sit down the majority of  the time she is cooking. Still needs back support for prolonged sitting. As of 6/10: Pt. able to do for  ~1 hour before she needs to rest to prevent increased pain, pain is averaging ~ 4-5/10    Time 10    Period Weeks    Status On-going    Target Date 06/24/20      PT LONG TERM GOAL #2   Title Patient will score at or below 65/300  on the PFDI and 35% on the Female NIH-CPSI to demonstrate a clinically meaningful decrease in disability and distress due to pelvic floor dysfunction.    Baseline PFDI: 110/300, Female NIH-CPSI: 29/43 (67%) As of 4/29: 100/300 on PFDI and 30/43 on NIH-CPSI As of 7/6:  NIH-CPSI: 32/43 POPDI: 61/100, CRADI-8: 29/100, UDI: 44/100 (PFDI: 149/300), As of 04/15/20: Female NIH-CPSI: 29/43, PFDI: 84/300    Time 10    Period Weeks    Status On-going    Target Date 06/24/20      PT LONG TERM GOAL #3   Title Patient will report no pain with intercourse to demonstrate improved functional ability.    Baseline Pt. Having significant pain with intercourse. As of 4/29: is a little better (20-30%) but is worse on days where overall tension/pain is worse. As of 6/10: can have days where there is not much pain but other days where there is significant pain. As of 11/9: same as previous update. AS of 04/15/20: 25% improved from base-line    Time 10    Period  Weeks    Status On-going    Target Date 06/24/20      PT LONG TERM GOAL #4   Title Pt will report ability to work in her yard for greater than 30 minutes without extreme fatigue    Baseline limited to 10-15 minutes before pt requires ending activity. As of 4/29: ~ 20 min. As of 6/10: has not been gardening much but can do ~ 30 min of similar indoor activity level before increased fatigue.    Time 10    Period Weeks    Status Achieved    Target Date 10/23/19      PT LONG TERM GOAL #5   Title Pt. will be able to go 2-3 hours between emptying her bladder without increased pain and empty her bladder fully whether by urostomy,  cath, or voluntary release.    Baseline Pt. has a urostomy but continues to have to self-catheterize. Has pain with bladder filling and when using catheter. As of 4/29: is doing better since starting to take the pregabalin andd baclofen but occasionally still has pain with bladder filling. It is bad when she has an infection or over-does activity, better on "normal" days which have been few and far between due to frequent UTI's. As of 6/10: Pt. has "good" and "bad" days but has more days where she can go 2-3 hours without increased pain. As of 04/15/20: able to wait 2 hours, not 3 yet    Time 10    Period Weeks    Status On-going    Target Date 06/24/20      PT LONG TERM GOAL #6   Title Patient will report having BM's at least every-other day with consistency between [facility] stool scale 3-5 over the prior week to demonstrate decreased constipation.    Baseline Pt. unable to have regula BM's without medication, manometry shows PFM dysfunction. As of 4/29: still having to use and enema or supository occasionally and still needing linzess. As of 6/10: Pt. has days where she can empty ok and days where she needs to use an Enema to stimulate a BM, feels that it is just the insertion of the enema, not the solution that does the job. As of 11/9: Pt. not having constipation due to colonic infection that has shifted her toward diarrhea. Having BM's daily, sometimes type 5. Has had diarrhea more than constipation, still had to take linzess 1 time to go after 2 days without a BM.    Time 10    Period Weeks    Status On-going    Target Date 06/24/20      PT LONG TERM GOAL #7   Title Pt. will drive to a meaningful activity to demonstrate increased function and participation    Baseline Pt. has not been able to drive due to decreased LE control    Time 10    Period Weeks    Status New    Target Date 06/24/20      PT LONG TERM GOAL #8   Title Pt. will be able to perform 1 hr. of light housework reqiring  intermittent standing and walking without increased pain/fatigue >2 pts.    Baseline Pt. becomes fatigued washing fruits/vegetbles in small stages and has fatigue>3 pts.    Time 10    Period Weeks    Status New    Target Date 06/24/20      PT LONG TERM GOAL  #9   TITLE Pt. will be able to attend  a full athletic event for her children without Sx increasing by more than 2 pts.    Baseline Pt. having migraine or severe bladder spasms and fatigue and back pain> 3 pt. increase with attendance at athletic events    Time 20    Period Weeks    Status New    Target Date 09/10/20                 Plan - 06/10/20 1201    Clinical Impression Statement Pt. Responded well to all interventions today, demonstrating improved pelvic alignment, decreased spasm and TTP, as well as understanding and correct performance of all education and exercises provided today. They will continue to benefit from skilled physical therapy to work toward remaining goals and maximize function as well as decrease likelihood of symptom increase or recurrence.    PT Next Visit Plan use balance game for 5 min., ask about stationary bike success/fatigue levels. Ask about taping/re-apply, further suprapubic scar work, deep-core, scapular tracking/strength, and balance, MFR to lower abdomen, Review pain diary, cupping to medial thighs and upper back, re-assess neural glides on LLE, focus on graded activity, and seated and standing balance/core strength. review I's Y's and T's in prone, thoracic mobility? perform further release and PNF for R>L shoulder blades, MFR around B ilium. continue to work on thoracolumbar rotation ROM and decreasing spasms    PT Home Exercise Plan A day: low back stretch, side-stretch, hamstring stretch, butterfly stretch, hip EXT in prone with knee bent, standing hip ABD/EXT, calf raises. B day: Chest-stretch, Thoracic extensions over a towel roll, seated rows and seated scapular retraction (pull shoulder  blades together like "T's") with band, child's pose stretch, self internal TP release (especially when Pelvic floor spasms high), Every day: Pain diary, Posterior pelvic tilts with deep core activation and red band for obliques activation, bow-and-arrow, TENS unit 3+ times per day for 30 min and as needed if pain starts to increase. TENS at ASIS/PSIS at strong tingle for 1 hr. per day. Sleeper stretch PRN, vagal nerve toning    Consulted and Agree with Plan of Care Patient           Patient will benefit from skilled therapeutic intervention in order to improve the following deficits and impairments:     Visit Diagnosis: Other muscle spasm  Muscle weakness (generalized)  Difficulty in walking, not elsewhere classified     Problem List Patient Active Problem List   Diagnosis Date Noted  . Seizure ([place]) 11/11/2018  . Major depressive disorder, recurrent episode, moderate ([place]) 02/07/2018  . Nephrolithiasis 04/16/2016  . Numbness 07/28/2015  . Bladder retention 06/23/2015  . Abdominal pain 06/04/2015  . Dizziness 05/18/2015  . Neck pain 05/18/2015  . Complicated migraine [id number]  . Other fatigue 04/28/2015  . Abnormal finding on MRI of brain 04/28/2015  . D (diarrhea) 03/29/2015  . H/O disease 03/29/2015  . Abnormal weight loss 03/29/2015  . Muscle weakness (generalized) 03/14/2015  . Headache, migraine 03/10/2015   Willa Rough DPT, ATC Willa Rough 06/10/2020, 12:03 PM  [place] MAIN [facility] SERVICES [address] [place], [place], [address] Phone: [phone]   Fax:  [phone]  Name: Jersey Espinoza MRN: [id number] Date of Birth: [birth year]

## 2020-06-17 ENCOUNTER — Ambulatory Visit: Payer: BC Managed Care – PPO | Attending: Gastroenterology

## 2020-06-17 ENCOUNTER — Other Ambulatory Visit: Payer: Self-pay

## 2020-06-17 DIAGNOSIS — R262 Difficulty in walking, not elsewhere classified: Secondary | ICD-10-CM | POA: Diagnosis present

## 2020-06-17 DIAGNOSIS — M62838 Other muscle spasm: Secondary | ICD-10-CM | POA: Insufficient documentation

## 2020-06-17 DIAGNOSIS — M6281 Muscle weakness (generalized): Secondary | ICD-10-CM | POA: Diagnosis present

## 2020-06-17 NOTE — Therapy (Signed)
[place] MA[facility] SERVICES [address] [place], [place], [address] Phone: [phone]   Fax:  [phone]  Physical Therapy Treatment  The patient has been informed of current processes in place at Outpatient Rehab to protect patients from Covid-19 exposure including social distancing, schedule modifications, and new cleaning procedures. After discussing their particular risk with a therapist based on the patient's personal risk factors, the patient has decided to proceed with in-person therapy.  Patient Details  Name: Christine Chavez MRN: [id number] Date of Birth: 1980-08-26 No data recorded  Encounter Date: 06/17/2020   PT End of Session - 06/17/20 1118    Visit Number 8    Number of Visits 20    Date for PT Re-Evaluation 05/04/20    Authorization Type BC[employer]    Authorization Time Period from 04/15/20 through 06/24/20    Authorization - Visit Number 9    Authorization - Number of Visits 20    Progress Note Due on Visit 98    PT Start Time 2725    PT Stop Time 1135    PT Time Calculation (min) 60 min    Activity Tolerance Patient tolerated treatment well;No increased pain    Behavior During Therapy WFL for tasks assessed/performed           Past Medical History:  Diagnosis Date  . Complication of anesthesia    ? seizures after anesthesia   . Headache   . Migraines   . Neurogenic bladder   . Renal disorder   . Vision abnormalities     Past Surgical History:  Procedure Laterality Date  . ANTERIOR CRUCIATE LIGAMENT REPAIR  1997  . APPENDECTOMY    . COLONOSCOPY WITH PROPOFOL N/A 11/11/2018   Procedure: COLONOSCOPY WITH PROPOFOL;  Surgeon: [name], MD;  Location: [facility] ENDOSCOPY;  Service: Endoscopy;  Laterality: N/A;  . CYSTOSCOPY WITH STENT PLACEMENT Right 04/17/2016   Procedure: CYSTOSCOPY WITH STENT PLACEMENT;  Surgeon: [name], MD;  Location: [facility] ORS;  Service: Urology;  Laterality: Right;  .  ESOPHAGOGASTRODUODENOSCOPY (EGD) WITH PROPOFOL N/A 11/11/2018   Procedure: ESOPHAGOGASTRODUODENOSCOPY (EGD) WITH PROPOFOL;  Surgeon: [name], MD;  Location: [facility] ENDOSCOPY;  Service: Endoscopy;  Laterality: N/A;  . EXPLORATORY LAPAROTOMY  1999  . KIDNEY STONE SURGERY  04/2016  . REVISION UROSTOMY CUTANEOUS    . REVISION UROSTOMY CUTANEOUS  01/10/2018  . SUPRAPUBIC CATHETER PLACEMENT  08/2017  . TONSILLECTOMY      There were no vitals filed for this visit.   Pelvic Floor Physical Therapy Treatment Note  SCREENING  Changes in medications, allergies, or medical history?: She has a UTI and is considering inter-stim.  SUBJECTIVE  Patient reports: She saw neurology and they want to send her to rheumatology and that they are not the specialist to help her. He was supportive of her potentially being evaluated by [facility] or [facility]. They went from there to the [facility] products store for hand controls and did not have a good experience. It looks like the kidney ultrasound was clear but she is still having a lot of back pain. Bladder spasms have still been a lot better and has only had a couple small spikes.  Precautions:  Neurological diagnosis pending, has "non-specific" brain lesions   Pain update:  Location of pain: LB and neck/shoulders (bladder) Current pain:  5/10) (3/10) Max pain: 7/10 (5/10 when it spasms) Least pain:  3/10 (3/10) Nature of pain: sharp to dull ache (grabbing)  **  Pt. Reports less TTP and improved stability with standing after treatment.  Patient Goals: Get rid of the discomfort with catheterizing and ache/bladder pain. Be able to take fewer medications and be able to enjoy spending time with her family again, doing "all the mom things"    OBJECTIVE  Changes in:  Observations/Posture:  L lumbar curve prominent with transverse processes more prominent on the L. (from previous note)  C3 spinous process deviated L through C2-3. (from previous  note)  12/30: L up-slip and anterior rotation **following treatment L iliacus remained tight, not allowing for complete up-slip or rotation correction.  TODAY: PSIS appear level in prone, L5 spinous process prominent.   Range of Motion/Flexibilty:  (from 03/27/19) Spine: ~ 2-3 fingers from knee with SB B. ~ 50% reduced R rotation with stretch in L, ~ 75% reduced L rotation with increased tightness. HS: able to achieve 90 deg. Hip flexion in seated HS stretch. (from prior treatment)  Decreased PIVM through Thoracic, lumbar, and sacral spine with greatest restriction at T3-6. (from prior session)  12/30:  no decreased mobility at the sacrum with attempt at manual correction for L anterior rotation, hip-flexor spasm keeping Pt. From approp   Strength/MMT:  LE MMT (from 03-27-19) LE MMT Left Right  Hip flex:  (L2) /5 /5  Hip ext: 3+/5 3+/5  Hip abd: 4/5 4/5  Hip add: 4/5 3+/5  Hip IR 4/5 3+/5  Hip ER 4/5 4/5   (From 3/24): Pt. Demonstrates 3/5 strength with "I's, Y's, and T's" but 4/5 with W's. (from 6/22) DF: 9 reps with B. Eversion/Inversion: 4/5 on R, 4+/5 on L. DF: L 4+/5, R 3+/5  Seated IR/ER on L: 4/5, R 3+/5 (from 8-5) L DF and Eversion 4+/5 with first 1-3 reps and fatigue decreased to 4/4. (9/9) Pt. Demonstrates ~ 4+/5 B with increased spasticity B.   Balance/stability:  (6-29) Pt. Able to achieve good posture without cueing but only able to maintain for ~ 2.5 min. Before seeking stability from the table and forward rolled shoulders. With cue to engage the deep core, Pt. Recognizes increased stability and comfort.  8/5: Pt. Demonstrating decreased stability in gait and standing balance today with fasciculations increasing in direct relation to muscular endurance required. Able to stand ~ 10 sec. In SLS with MIN UE support before intensity of spasms make attempt at balance futile.   12/30: Pt. Reports feeling less stable on LLE again, similar to August but less intense.    1/31: Able to stand for ~ 10 sec. In double leg stance and on RLE with no LOB the first attempt but required TC and VC for L glute med. Activation for LLE. Second attempt less cueing recuired for LLE but far more fasiculations noted. Was able to stand with slow perterbations in multiple directions applied to the shoulders for ~ 20 sec. Before fatigue slowed reaction time.  2/17: performed circle maze and square maze on balance machine with BUE support. Circle: 134 sec. -4 pts. Square: 137 sec. And -17 pts.   Pelvic Floor External Exam: (From prior session)- [Introitus Appears: elevated Skin integrity: WNL Palpation: TTP to B STP Cough: limited motion Prolapse visible?: no Scar mobility: not assessed  Internal Vaginal Exam: Strength (PERF): Pt. Has difficulty sensing the PFM to generate contraction or relaxation due to high tone/neuro involvement Symmetry: L>R for spasm/TTP Palpation: TTP to all muscles throughout B Prolapse: none]  unable to coordinate squeeze and TTP to all areas with the greatest at B  OI and coccygeus. ** following TP release Pt. Able to achieve 1/5 PFM contraction. Decreased TTP at B anterior PR/PC following treatment. (10/14).  TODAY: not re-assessed today.  Abdominal:  Pt. Demonstrated ability to recruit R>L obliques and TA with pelvic tilts. (from prior session)  Palpation: Decreased fascial and scar mobility through lower abdomen with radiating Sx. To the pelvis and LB. (from prior session)  12/30: TTP to L QL and Iliacus  Murphy's punch sign: Some sharp pain that shot forward with percussion to B kidneys. (from 06/03/20)   TODAY: TTP to L QL and B erector spinae and multifidus from L3-5  Gait Analysis:  (04/22/20) 2 min. Walk test: 12 meters on 1st attempt. 7.75 meters on 2nd attempt. (video)  Pt. Demonstrated crouched walk with difficulty controlling where she placed the R>L LE and ~ 3-4 near LOB while walking on 2nd attempt with PT at [facility] and  using Rollator  (Pt. Feels like this would have been better 2-3 days ago even)  2/3: Pt. Walked ~ 50 ft. x2 With rollator and gait belt. Attempted to walk to metronome cadence but with difficulty, ended up keeping to ~ 1/2 time of the tempo with ~ 50% consistency. Some difficulty with control noted but less than during initial walk test.  INTERVENTIONS THIS SESSION: Manual: Performed TP release and STM to  L QL and B erector spinae and multifidus from L3-5 to decrease spasm and pain and allow for improved balance of musculature for improved function and decreased symptoms.  Dry-needle: Performed TPDN with 4 .30x72mm needles and standard approach combined with E-stim as described below to decrease spasm and pain and allow for improved balance of musculature for improved function and decreased symptoms.  E-stim: Performed attended E-stim with TPDN across L3-5 at a frequency of 2 and intensity of ~ 61mA for 15 min. To decrease spasm and pain and help improve neural flow.  Self-care: Discussed her lack of success with the van products store and current plan for neuro issues. Encouraged to follow up with her other lead on hand controls that is closer to home. Reminded Pt. That her side-stretch is the most important for helping keep her L QL calm. Discussed transition to [facility] for LE strengthening and balance starting monday.  Total time: 60 min.                       Trigger Point Dry Needling - 06/17/20 0001    Consent Given? Yes    Education Handout Provided No    Muscles Treated Back/Hip Lumbar multifidi;Erector spinae    Dry Needling Comments Bilateral at L3-5    Electrical Stimulation Performed with Dry Needling Yes    Erector spinae Response Twitch response elicited;Palpable increased muscle length    Lumbar multifidi Response Palpable increased muscle length                  PT Short Term Goals - 04/15/20 1044      PT SHORT TERM GOAL #1   Title Patient  will demonstrate coordinated diaphragmatic breathing with pelvic tilts to demonstrate improved control of diaphragm and TA, to allow for further strengthening of core musculature and decreased pelvic floor spasm.    Baseline Pt. demonstrates breathing dysfunction and poor PFM coordination evidenced by anal manometry As of 2/22: Pt. continues to have restriction in her diaphragm and near T/L junction but is able to intentionally use diaphragmatic breathing through the available ROM.    Time  5    Period Weeks    Status Achieved    Target Date 04/20/19      PT SHORT TERM GOAL #2   Title Patient will report a reduction in pain to no greater than 6/10 over the prior week to demonstrate symptom improvement.    Baseline Pain is 10/10 at worst, 2/10 at best As of 2/22: Pt. had achieved this goal for 1 week as of 1/21 but may have infection or other insult that caused pain to increase again. As of 4/29: Pain high of 8/10 but has been over-doing her activity over the past week. As of 6/10: Pt. was able to keep pain at 6 or below since prior visit by using TENS to help keep tension lower but has not had a full week to test if it will keep working. As of 7/6: Pt. had met this goal but has a new UTI and it caused pain to spike to 8/10 over the weekend. As of 11/9: Pt. has just finally made progress with long-standing UTI and new POC for treatment so she is still having pain spikes up to 8/10 but we expect greater improvement over the next few months. As of 12/30: still feels like she is trending in a better direction but over-did it over the holidays and pain spiked to 8/10. No higher than 6/10 over last 2 days, even with PMS cramping.    Time 5    Period Weeks    Status On-going    Target Date 06/24/20      PT SHORT TERM GOAL #3   Title Patient will demonstrate HEP x1 in the clinic to demonstrate understanding and proper form to allow for further improvement.    Baseline Pt. lacks knowledge of therepeutic  exercises that can decrease her pain/Sx.    Time 5    Period Weeks    Status Achieved    Target Date 04/20/19      PT SHORT TERM GOAL #4   Title Patient will report consistent use of foot-stool (squatty-potty) for positioning with BM to decrease pain with BM and intra-abdominal pressure.    Baseline Pt. having constipation due to PFM dysfunction    Time 5    Period Weeks    Status Achieved    Target Date 04/20/19             PT Long Term Goals - 04/23/20 0001      PT LONG TERM GOAL #1   Title Pt. will be able to participate in regular ADL's with least restrictive device without pain increasing greater than 2/10    Baseline Pt. limited in her ability to perform household duties by increased pain and fatigue. As of 4/29: Her legs have more endurance with walking before she gets a tremor. Still needs to sit down the majority of the time she is cooking. Still needs back support for prolonged sitting. As of 6/10: Pt. able to do for  ~1 hour before she needs to rest to prevent increased pain, pain is averaging ~ 4-5/10    Time 10    Period Weeks    Status On-going    Target Date 06/24/20      PT LONG TERM GOAL #2   Title Patient will score at or below 65/300  on the PFDI and 35% on the Female NIH-CPSI to demonstrate a clinically meaningful decrease in disability and distress due to pelvic floor dysfunction.    Baseline PFDI: 110/300, Female NIH-CPSI: 29/43 (  67%) As of 4/29: 100/300 on PFDI and 30/43 on NIH-CPSI As of 7/6:  NIH-CPSI: 32/43 POPDI: 61/100, CRADI-8: 29/100, UDI: 44/100 (PFDI: 149/300), As of 04/15/20: Female NIH-CPSI: 29/43, PFDI: 84/300    Time 10    Period Weeks    Status On-going    Target Date 06/24/20      PT LONG TERM GOAL #3   Title Patient will report no pain with intercourse to demonstrate improved functional ability.    Baseline Pt. Having significant pain with intercourse. As of 4/29: is a little better (20-30%) but is worse on days where overall tension/pain  is worse. As of 6/10: can have days where there is not much pain but other days where there is significant pain. As of 11/9: same as previous update. AS of 04/15/20: 25% improved from base-line    Time 10    Period Weeks    Status On-going    Target Date 06/24/20      PT LONG TERM GOAL #4   Title Pt will report ability to work in her yard for greater than 30 minutes without extreme fatigue    Baseline limited to 10-15 minutes before pt requires ending activity. As of 4/29: ~ 20 min. As of 6/10: has not been gardening much but can do ~ 30 min of similar indoor activity level before increased fatigue.    Time 10    Period Weeks    Status Achieved    Target Date 10/23/19      PT LONG TERM GOAL #5   Title Pt. will be able to go 2-3 hours between emptying her bladder without increased pain and empty her bladder fully whether by urostomy, cath, or voluntary release.    Baseline Pt. has a urostomy but continues to have to self-catheterize. Has pain with bladder filling and when using catheter. As of 4/29: is doing better since starting to take the pregabalin andd baclofen but occasionally still has pain with bladder filling. It is bad when she has an infection or over-does activity, better on "normal" days which have been few and far between due to frequent UTI's. As of 6/10: Pt. has "good" and "bad" days but has more days where she can go 2-3 hours without increased pain. As of 04/15/20: able to wait 2 hours, not 3 yet    Time 10    Period Weeks    Status On-going    Target Date 06/24/20      PT LONG TERM GOAL #6   Title Patient will report having BM's at least every-other day with consistency between [facility] stool scale 3-5 over the prior week to demonstrate decreased constipation.    Baseline Pt. unable to have regula BM's without medication, manometry shows PFM dysfunction. As of 4/29: still having to use and enema or supository occasionally and still needing linzess. As of 6/10: Pt. has days  where she can empty ok and days where she needs to use an Enema to stimulate a BM, feels that it is just the insertion of the enema, not the solution that does the job. As of 11/9: Pt. not having constipation due to colonic infection that has shifted her toward diarrhea. Having BM's daily, sometimes type 5. Has had diarrhea more than constipation, still had to take linzess 1 time to go after 2 days without a BM.    Time 10    Period Weeks    Status On-going    Target Date 06/24/20  PT LONG TERM GOAL #7   Title Pt. will drive to a meaningful activity to demonstrate increased function and participation    Baseline Pt. has not been able to drive due to decreased LE control    Time 10    Period Weeks    Status New    Target Date 06/24/20      PT LONG TERM GOAL #8   Title Pt. will be able to perform 1 hr. of light housework reqiring intermittent standing and walking without increased pain/fatigue >2 pts.    Baseline Pt. becomes fatigued washing fruits/vegetbles in small stages and has fatigue>3 pts.    Time 10    Period Weeks    Status New    Target Date 06/24/20      PT LONG TERM GOAL  #9   TITLE Pt. will be able to attend a full athletic event for her children without Sx increasing by more than 2 pts.    Baseline Pt. having migraine or severe bladder spasms and fatigue and back pain> 3 pt. increase with attendance at athletic events    Time 20    Period Weeks    Status New    Target Date 09/10/20                 Plan - 06/17/20 1119    Clinical Impression Statement Pt. Responded well to all interventions today, demonstrating decreased spasm and pain across the low back as well as understanding and correct performance of all education and exercises provided today. They will continue to benefit from skilled physical therapy to work toward remaining goals and maximize function as well as decrease likelihood of symptom increase or recurrence.   PT Next Visit Plan use balance  game for 5 min., ask about stationary bike success/fatigue levels. Ask about taping/re-apply, further suprapubic scar work, deep-core, scapular tracking/strength, and balance, MFR to lower abdomen, Review pain diary, cupping to medial thighs and upper back, re-assess neural glides on LLE, focus on graded activity, and seated and standing balance/core strength. review I's Y's and T's in prone, thoracic mobility? perform further release and PNF for R>L shoulder blades, MFR around B ilium. continue to work on thoracolumbar rotation ROM and decreasing spasms    PT Home Exercise Plan A day: low back stretch, side-stretch, hamstring stretch, butterfly stretch, hip EXT in prone with knee bent, standing hip ABD/EXT, calf raises. B day: Chest-stretch, Thoracic extensions over a towel roll, seated rows and seated scapular retraction (pull shoulder blades together like "T's") with band, child's pose stretch, self internal TP release (especially when Pelvic floor spasms high), Every day: Pain diary, Posterior pelvic tilts with deep core activation and red band for obliques activation, bow-and-arrow, TENS unit 3+ times per day for 30 min and as needed if pain starts to increase. TENS at ASIS/PSIS at strong tingle for 1 hr. per day. Sleeper stretch PRN, vagal nerve toning    Consulted and Agree with Plan of Care Patient           Patient will benefit from skilled therapeutic intervention in order to improve the following deficits and impairments:     Visit Diagnosis: Other muscle spasm  Muscle weakness (generalized)  Difficulty in walking, not elsewhere classified     Problem List Patient Active Problem List   Diagnosis Date Noted  . Seizure ([place]) 11/11/2018  . Major depressive disorder, recurrent episode, moderate ([place]) 02/07/2018  . Nephrolithiasis 04/16/2016  . Numbness 07/28/2015  . Bladder retention  06/23/2015  . Abdominal pain 06/04/2015  . Dizziness 05/18/2015  . Neck pain 05/18/2015  .  Complicated migraine 85/27/7824  . Other fatigue 04/28/2015  . Abnormal finding on MRI of brain 04/28/2015  . D (diarrhea) 03/29/2015  . H/O disease 03/29/2015  . Abnormal weight loss 03/29/2015  . Muscle weakness (generalized) 03/14/2015  . Headache, migraine 03/10/2015   Willa Rough DPT, ATC Willa Rough 06/17/2020, 11:21 AM  [place] MAIN [facility] SERVICES [address] [place], [place], [address] Phone: [phone]   Fax:  [phone]  Name: Joyous Gleghorn MRN: [id number] Date of Birth: 1980/07/07

## 2020-06-21 ENCOUNTER — Other Ambulatory Visit: Payer: Self-pay

## 2020-06-21 ENCOUNTER — Ambulatory Visit: Payer: BC Managed Care – PPO

## 2020-06-21 DIAGNOSIS — M62838 Other muscle spasm: Secondary | ICD-10-CM

## 2020-06-21 DIAGNOSIS — R262 Difficulty in walking, not elsewhere classified: Secondary | ICD-10-CM

## 2020-06-21 DIAGNOSIS — M6281 Muscle weakness (generalized): Secondary | ICD-10-CM

## 2020-06-21 NOTE — Therapy (Signed)
[place] MAIN [facility] SERVICES [address] [place], [place], [address] Phone: [phone]   Fax:  [phone]  Physical Therapy Treatment  Patient Details  Name: Christine Chavez MRN: [id number] Date of Birth: 04/14/1981 No data recorded  Encounter Date: 06/21/2020   PT End of Session - 06/21/20 1001    Visit Number 54    Number of Visits 45    Date for PT Re-Evaluation 06/24/20    Authorization Type [employer]; 1 session balance PT    Authorization Time Period from 04/15/20 through 06/24/20    Authorization - Visit Number 9    Authorization - Number of Visits 20    Progress Note Due on Visit 98    PT Start Time 1010    PT Stop Time 1059    PT Time Calculation (min) 49 min    Activity Tolerance Patient tolerated treatment well;No increased pain    Behavior During Therapy WFL for tasks assessed/performed           Past Medical History:  Diagnosis Date  . Complication of anesthesia    ? seizures after anesthesia   . Headache   . Migraines   . Neurogenic bladder   . Renal disorder   . Vision abnormalities     Past Surgical History:  Procedure Laterality Date  . ANTERIOR CRUCIATE LIGAMENT REPAIR  1997  . APPENDECTOMY    . COLONOSCOPY WITH PROPOFOL N/A 11/11/2018   Procedure: COLONOSCOPY WITH PROPOFOL;  Surgeon: [name], MD;  Location: [facility] ENDOSCOPY;  Service: Endoscopy;  Laterality: N/A;  . CYSTOSCOPY WITH STENT PLACEMENT Right 04/17/2016   Procedure: CYSTOSCOPY WITH STENT PLACEMENT;  Surgeon: [name], MD;  Location: [facility] ORS;  Service: Urology;  Laterality: Right;  . ESOPHAGOGASTRODUODENOSCOPY (EGD) WITH PROPOFOL N/A 11/11/2018   Procedure: ESOPHAGOGASTRODUODENOSCOPY (EGD) WITH PROPOFOL;  Surgeon: [name], MD;  Location: [facility] ENDOSCOPY;  Service: Endoscopy;  Laterality: N/A;  . EXPLORATORY LAPAROTOMY  1999  . KIDNEY STONE SURGERY  04/2016  . REVISION UROSTOMY CUTANEOUS    . REVISION UROSTOMY CUTANEOUS   01/10/2018  . SUPRAPUBIC CATHETER PLACEMENT  08/2017  . TONSILLECTOMY      There were no vitals filed for this visit.   Subjective Assessment - 06/21/20 0959    Subjective Patient arrived for PT session for balance and strengthening in combination to her current pelvic floor therapy session.    Limitations Lifting;Standing;Walking;House hold activities;Other (comment)    Patient Stated Goals to regain PLOF    Currently in Pain? Yes    Pain Score 4     Pain Location Back    Pain Orientation Lower    Pain Descriptors / Indicators Aching    Pain Type Chronic pain    Pain Onset More than a month ago    Pain Frequency Constant                  Assess BERG-unable to perform    Treatment:   in // bars:  -ambulate length of bars, buckling of LLE requires stabilization  -static stand 30 seocnds with intermittent hold.  -step over theraband on floor, stabilization to L leg. 6x each LE, very fatiguing.  -lateral stepping 2x length of // bars, one near LOB due to knee buckling.     Access Code: [email or web address] URL: [email or web address] Date: 06/21/2020 Prepared by: Janna Arch  Exercises Seated Hamstring Curls with Resistance - 1 x daily - 7 x weekly - 2 sets -  10 reps - 5 hold Seated Hip Abduction with Resistance - 1 x daily - 7 x weekly - 2 sets - 10 reps - 5 hold Seated March - 1 x daily - 7 x weekly - 2 sets - 10 reps - 5 hold Prone on Elbows Stretch - 1 x daily - 7 x weekly - 2 sets - 1 reps - 1 hold Seated Hip Adduction Isometrics with Ball - 1 x daily - 7 x weekly - 2 sets - 10 reps - 5 hold Seated Long Arc Quad - 1 x daily - 7 x weekly - 2 sets - 10 reps - 5 hold      Pt educated throughout session about proper posture and technique with exercises. Improved exercise technique, movement at target joints, use of target muscles after min to mod verbal, visual, tactile cues  Patient introduced to strengthening and stabilization interventions She is highly  motivated and tolerated interventions well with use of a heat pad during rest sessions. Patient educated on and demonstrated understanding of HEP program. She is unable to tolerate BERG due to limited stabilization at this time.  Patient will benefit from skilled physical therapy to increase strength, stability, and decrease fall risk for improved quality of life.           PT Education - 06/21/20 1000    Education Details exercise technique, body mechanics    Person(s) Educated Patient    Methods Explanation;Demonstration;Tactile cues;Verbal cues    Comprehension Verbalized understanding;Returned demonstration;Verbal cues required;Tactile cues required            PT Short Term Goals - 04/15/20 1044      PT SHORT TERM GOAL #1   Title Patient will demonstrate coordinated diaphragmatic breathing with pelvic tilts to demonstrate improved control of diaphragm and TA, to allow for further strengthening of core musculature and decreased pelvic floor spasm.    Baseline Pt. demonstrates breathing dysfunction and poor PFM coordination evidenced by anal manometry As of 2/22: Pt. continues to have restriction in her diaphragm and near T/L junction but is able to intentionally use diaphragmatic breathing through the available ROM.    Time 5    Period Weeks    Status Achieved    Target Date 04/20/19      PT SHORT TERM GOAL #2   Title Patient will report a reduction in pain to no greater than 6/10 over the prior week to demonstrate symptom improvement.    Baseline Pain is 10/10 at worst, 2/10 at best As of 2/22: Pt. had achieved this goal for 1 week as of 1/21 but may have infection or other insult that caused pain to increase again. As of 4/29: Pain high of 8/10 but has been over-doing her activity over the past week. As of 6/10: Pt. was able to keep pain at 6 or below since prior visit by using TENS to help keep tension lower but has not had a full week to test if it will keep working. As of 7/6:  Pt. had met this goal but has a new UTI and it caused pain to spike to 8/10 over the weekend. As of 11/9: Pt. has just finally made progress with long-standing UTI and new POC for treatment so she is still having pain spikes up to 8/10 but we expect greater improvement over the next few months. As of 12/30: still feels like she is trending in a better direction but over-did it over the holidays and pain spiked to  8/10. No higher than 6/10 over last 2 days, even with PMS cramping.    Time 5    Period Weeks    Status On-going    Target Date 06/24/20      PT SHORT TERM GOAL #3   Title Patient will demonstrate HEP x1 in the clinic to demonstrate understanding and proper form to allow for further improvement.    Baseline Pt. lacks knowledge of therepeutic exercises that can decrease her pain/Sx.    Time 5    Period Weeks    Status Achieved    Target Date 04/20/19      PT SHORT TERM GOAL #4   Title Patient will report consistent use of foot-stool (squatty-potty) for positioning with BM to decrease pain with BM and intra-abdominal pressure.    Baseline Pt. having constipation due to PFM dysfunction    Time 5    Period Weeks    Status Achieved    Target Date 04/20/19             PT Long Term Goals - 04/23/20 0001      PT LONG TERM GOAL #1   Title Pt. will be able to participate in regular ADL's with least restrictive device without pain increasing greater than 2/10    Baseline Pt. limited in her ability to perform household duties by increased pain and fatigue. As of 4/29: Her legs have more endurance with walking before she gets a tremor. Still needs to sit down the majority of the time she is cooking. Still needs back support for prolonged sitting. As of 6/10: Pt. able to do for  ~1 hour before she needs to rest to prevent increased pain, pain is averaging ~ 4-5/10    Time 10    Period Weeks    Status On-going    Target Date 06/24/20      PT LONG TERM GOAL #2   Title Patient will  score at or below 65/300  on the PFDI and 35% on the Female NIH-CPSI to demonstrate a clinically meaningful decrease in disability and distress due to pelvic floor dysfunction.    Baseline PFDI: 110/300, Female NIH-CPSI: 29/43 (67%) As of 4/29: 100/300 on PFDI and 30/43 on NIH-CPSI As of 7/6:  NIH-CPSI: 32/43 POPDI: 61/100, CRADI-8: 29/100, UDI: 44/100 (PFDI: 149/300), As of 04/15/20: Female NIH-CPSI: 29/43, PFDI: 84/300    Time 10    Period Weeks    Status On-going    Target Date 06/24/20      PT LONG TERM GOAL #3   Title Patient will report no pain with intercourse to demonstrate improved functional ability.    Baseline Pt. Having significant pain with intercourse. As of 4/29: is a little better (20-30%) but is worse on days where overall tension/pain is worse. As of 6/10: can have days where there is not much pain but other days where there is significant pain. As of 11/9: same as previous update. AS of 04/15/20: 25% improved from base-line    Time 10    Period Weeks    Status On-going    Target Date 06/24/20      PT LONG TERM GOAL #4   Title Pt will report ability to work in her yard for greater than 30 minutes without extreme fatigue    Baseline limited to 10-15 minutes before pt requires ending activity. As of 4/29: ~ 20 min. As of 6/10: has not been gardening much but can do ~ 30 min of similar indoor  activity level before increased fatigue.    Time 10    Period Weeks    Status Achieved    Target Date 10/23/19      PT LONG TERM GOAL #5   Title Pt. will be able to go 2-3 hours between emptying her bladder without increased pain and empty her bladder fully whether by urostomy, cath, or voluntary release.    Baseline Pt. has a urostomy but continues to have to self-catheterize. Has pain with bladder filling and when using catheter. As of 4/29: is doing better since starting to take the pregabalin andd baclofen but occasionally still has pain with bladder filling. It is bad when she has  an infection or over-does activity, better on "normal" days which have been few and far between due to frequent UTI's. As of 6/10: Pt. has "good" and "bad" days but has more days where she can go 2-3 hours without increased pain. As of 04/15/20: able to wait 2 hours, not 3 yet    Time 10    Period Weeks    Status On-going    Target Date 06/24/20      PT LONG TERM GOAL #6   Title Patient will report having BM's at least every-other day with consistency between [facility] stool scale 3-5 over the prior week to demonstrate decreased constipation.    Baseline Pt. unable to have regula BM's without medication, manometry shows PFM dysfunction. As of 4/29: still having to use and enema or supository occasionally and still needing linzess. As of 6/10: Pt. has days where she can empty ok and days where she needs to use an Enema to stimulate a BM, feels that it is just the insertion of the enema, not the solution that does the job. As of 11/9: Pt. not having constipation due to colonic infection that has shifted her toward diarrhea. Having BM's daily, sometimes type 5. Has had diarrhea more than constipation, still had to take linzess 1 time to go after 2 days without a BM.    Time 10    Period Weeks    Status On-going    Target Date 06/24/20      PT LONG TERM GOAL #7   Title Pt. will drive to a meaningful activity to demonstrate increased function and participation    Baseline Pt. has not been able to drive due to decreased LE control    Time 10    Period Weeks    Status New    Target Date 06/24/20      PT LONG TERM GOAL #8   Title Pt. will be able to perform 1 hr. of light housework reqiring intermittent standing and walking without increased pain/fatigue >2 pts.    Baseline Pt. becomes fatigued washing fruits/vegetbles in small stages and has fatigue>3 pts.    Time 10    Period Weeks    Status New    Target Date 06/24/20      PT LONG TERM GOAL  #9   TITLE Pt. will be able to attend a full  athletic event for her children without Sx increasing by more than 2 pts.    Baseline Pt. having migraine or severe bladder spasms and fatigue and back pain> 3 pt. increase with attendance at athletic events    Time 20    Period Weeks    Status New    Target Date 09/10/20                 Plan -  06/21/20 1116    Clinical Impression Statement Patient introduced to strengthening and stabilization interventions She is highly motivated and tolerated interventions well with use of a heat pad during rest sessions. Patient educated on and demonstrated understanding of HEP program. She is unable to tolerate BERG due to limited stabilization at this time.  Patient will benefit from skilled physical therapy to increase strength, stability, and decrease fall risk for improved quality of life.    Comorbidities neurologic insult with pending diagnosis, multiple recurrent UTI's, BLE weakness, depression    Examination-Activity Limitations Caring for Others;Carry;Continence;Squat;Stairs;Toileting;Locomotion Level;Lift;Stand    Examination-Participation Restrictions Church;Cleaning;Community Activity;Driving;Interpersonal Relationship;Laundry;Yard Work;Meal Prep    Stability/Clinical Decision Making Unstable/Unpredictable    Rehab Potential Fair    PT Frequency 2x / week    PT Duration Other (comment)    PT Treatment/Interventions ADLs/Self Care Home Management;Aquatic Therapy;Biofeedback;Moist Heat;Electrical Stimulation;Cryotherapy;Traction;Ultrasound;Therapeutic activities;Functional mobility training;Stair training;Gait training;Therapeutic exercise;Balance training;Neuromuscular re-education;Patient/family education;Manual techniques;Dry needling;Scar mobilization;Energy conservation;Taping;Joint Manipulations;Spinal Manipulations;Visual/perceptual remediation/compensation;Passive range of motion    PT Next Visit Plan use balance game for 5 min., ask about stationary bike success/fatigue levels. Ask  about taping/re-apply, further suprapubic scar work, deep-core, scapular tracking/strength, and balance, MFR to lower abdomen, Review pain diary, cupping to medial thighs and upper back, re-assess neural glides on LLE, focus on graded activity, and seated and standing balance/core strength. review I's Y's and T's in prone, thoracic mobility? perform further release and PNF for R>L shoulder blades, MFR around B ilium. continue to work on thoracolumbar rotation ROM and decreasing spasms    PT Home Exercise Plan A day: low back stretch, side-stretch, hamstring stretch, butterfly stretch, hip EXT in prone with knee bent, standing hip ABD/EXT, calf raises. B day: Chest-stretch, Thoracic extensions over a towel roll, seated rows and seated scapular retraction (pull shoulder blades together like "T's") with band, child's pose stretch, self internal TP release (especially when Pelvic floor spasms high), Every day: Pain diary, Posterior pelvic tilts with deep core activation and red band for obliques activation, bow-and-arrow, TENS unit 3+ times per day for 30 min and as needed if pain starts to increase. TENS at ASIS/PSIS at strong tingle for 1 hr. per day. Sleeper stretch PRN, vagal nerve toning    Consulted and Agree with Plan of Care Patient           Patient will benefit from skilled therapeutic intervention in order to improve the following deficits and impairments:  Abnormal gait,Decreased balance,Decreased endurance,Decreased mobility,Difficulty walking,Increased muscle spasms,Impaired sensation,Improper body mechanics,Impaired tone,Decreased activity tolerance,Decreased coordination,Decreased strength,Postural dysfunction,Pain  Visit Diagnosis: Other muscle spasm  Muscle weakness (generalized)  Difficulty in walking, not elsewhere classified     Problem List Patient Active Problem List   Diagnosis Date Noted  . Seizure ([place]) 11/11/2018  . Major depressive disorder, recurrent episode, moderate  ([place]) 02/07/2018  . Nephrolithiasis 04/16/2016  . Numbness 07/28/2015  . Bladder retention 06/23/2015  . Abdominal pain 06/04/2015  . Dizziness 05/18/2015  . Neck pain 05/18/2015  . Complicated migraine [id number]  . Other fatigue 04/28/2015  . Abnormal finding on MRI of brain 04/28/2015  . D (diarrhea) 03/29/2015  . H/O disease 03/29/2015  . Abnormal weight loss 03/29/2015  . Muscle weakness (generalized) 03/14/2015  . Headache, migraine 03/10/2015   Janna Arch, PT, DPT   06/21/2020, 11:21 AM  [place] MAIN [facility] SERVICES [address] [place], [place], [address] Phone: [phone]   Fax:  [phone]  Name: Christine Chavez MRN: [id number] Date of Birth: 1980-07-12

## 2020-06-24 ENCOUNTER — Ambulatory Visit: Payer: BC Managed Care – PPO

## 2020-06-28 ENCOUNTER — Ambulatory Visit: Payer: BC Managed Care – PPO

## 2020-06-28 ENCOUNTER — Other Ambulatory Visit: Payer: Self-pay

## 2020-06-28 DIAGNOSIS — M62838 Other muscle spasm: Secondary | ICD-10-CM

## 2020-06-28 DIAGNOSIS — M6281 Muscle weakness (generalized): Secondary | ICD-10-CM

## 2020-06-28 DIAGNOSIS — R262 Difficulty in walking, not elsewhere classified: Secondary | ICD-10-CM

## 2020-06-28 NOTE — Therapy (Signed)
[place] MAIN [facility] SERVICES [address] [place], [place], [address] Phone: [phone]   Fax:  [phone]  Physical Therapy Treatment/RECERT/Physical Therapy Progress Note   Dates of reporting period  04/22/20  to   06/28/20  Patient Details  Name: Christine Chavez MRN: [id number] Date of Birth: 12-25-1980 No data recorded  Encounter Date: 06/28/2020   PT End of Session - 06/28/20 1025    Visit Number 90    Number of Visits 114    Date for PT Re-Evaluation 09/20/20    Authorization Type [employer]; 3 session balance PT    Authorization Time Period from 04/15/20 through 06/24/20    Authorization - Visit Number 10    Authorization - Number of Visits 20    Progress Note Due on Visit 98    PT Start Time 1029    PT Stop Time 1114    PT Time Calculation (min) 45 min    Activity Tolerance Patient tolerated treatment well;No increased pain    Behavior During Therapy WFL for tasks assessed/performed           Past Medical History:  Diagnosis Date  . Complication of anesthesia    ? seizures after anesthesia   . Headache   . Migraines   . Neurogenic bladder   . Renal disorder   . Vision abnormalities     Past Surgical History:  Procedure Laterality Date  . ANTERIOR CRUCIATE LIGAMENT REPAIR  1997  . APPENDECTOMY    . COLONOSCOPY WITH PROPOFOL N/A 11/11/2018   Procedure: COLONOSCOPY WITH PROPOFOL;  Surgeon: [name], MD;  Location: [facility] ENDOSCOPY;  Service: Endoscopy;  Laterality: N/A;  . CYSTOSCOPY WITH STENT PLACEMENT Right 04/17/2016   Procedure: CYSTOSCOPY WITH STENT PLACEMENT;  Surgeon: [name], MD;  Location: [facility] ORS;  Service: Urology;  Laterality: Right;  . ESOPHAGOGASTRODUODENOSCOPY (EGD) WITH PROPOFOL N/A 11/11/2018   Procedure: ESOPHAGOGASTRODUODENOSCOPY (EGD) WITH PROPOFOL;  Surgeon: [name], MD;  Location: [facility] ENDOSCOPY;  Service: Endoscopy;  Laterality: N/A;  . EXPLORATORY LAPAROTOMY  1999  .  KIDNEY STONE SURGERY  04/2016  . REVISION UROSTOMY CUTANEOUS    . REVISION UROSTOMY CUTANEOUS  01/10/2018  . SUPRAPUBIC CATHETER PLACEMENT  08/2017  . TONSILLECTOMY      There were no vitals filed for this visit.   Subjective Assessment - 06/28/20 1233    Subjective Patient reports she over did it yesterday, is having cramping/spasming of her bladder today. Needs to talk to her physician about urine color changes.    Limitations Lifting;Standing;Walking;House hold activities;Other (comment)    Patient Stated Goals to regain PLOF    Currently in Pain? Yes    Pain Score 4     Pain Location Back    Pain Orientation Lower    Pain Descriptors / Indicators Aching    Pain Type Chronic pain    Pain Onset More than a month ago    Pain Frequency Constant                 Progress note/recert Goals Reduction in pain: 8/10 in past 2 weeks from bladder spasm Participate in ADL with LRAD without pain increase greater than 2/10: able to perform ADLs with less pain. Has bad days that require more frequent breaks. PFDI: 44/100, 21/100 61/100; composite 126/300  No pain with intercourse-to be performed next session with pelvic therapist.  Work in yard for >30 minutes: haven't been able to do the outside tasks due  to weather. Cooks with some breaks depending on the day.  Go 2-3 hours between emptying her bladder without pain. somedays able to; some days is more frequent   BM's-assess next session with pelvic therapist  Patient will be able to drive:  Patient will perform 1 hour of light housework requiring intermittent standing/walking without increased pain/fatigue > 2 points: able to do perform.  Patient will attend a full athletic event for her children without sx. Have been able to go, use tens unit while there.    Treatment:  Next to support surface:  6" step toe taps, stabilization to knee provided to reduce buckling 10x each LE Heel raises 10x each LE Toe raises alternating 10x  each LE Hip abduction, stabilization to knee provided to reduce episodes of buckling 10x each LE Seated: RTB hamstring curl 10x each LE RTB around knees: abduction 15x RTB around knees march 15x RTB adduction 15x each LE 6" step toe taps 12x each LE   i  Patient's condition has the potential to improve in response to therapy. Maximum improvement is yet to be obtained. The anticipated improvement is attainable and reasonable in a generally predictable time.  Patient reports she is getting better but not to the level of independence she would like.      Patient is progressing towards functional goals with less pain and improved functional mobility for ADL performance. Pain continues to be present, especially when her bladder spasms however is becoming more tolerable. She continues to require strengthening and stabilization interventions to increase her functional mobility and decrease her risk for falls. Myoclonic jerking of LE's progressively worsens as she fatigues and will continue to be an area of focus. Patient's condition has the potential to improve in response to therapy. Maximum improvement is yet to be obtained. The anticipated improvement is attainable and reasonable in a generally predictable time.Patient will benefit from skilled physical therapy to increase strength, stability, and decrease fall risk for improved quality of life.            PT Education - 06/28/20 1025    Education Details goals, POC    Person(s) Educated Patient    Methods Explanation;Demonstration;Tactile cues;Verbal cues    Comprehension Verbalized understanding;Returned demonstration;Verbal cues required;Tactile cues required            PT Short Term Goals - 06/28/20 1233      PT SHORT TERM GOAL #1   Title Patient will demonstrate coordinated diaphragmatic breathing with pelvic tilts to demonstrate improved control of diaphragm and TA, to allow for further strengthening of core musculature and  decreased pelvic floor spasm.    Baseline Pt. demonstrates breathing dysfunction and poor PFM coordination evidenced by anal manometry As of 2/22: Pt. continues to have restriction in her diaphragm and near T/L junction but is able to intentionally use diaphragmatic breathing through the available ROM.    Time 5    Period Weeks    Status Achieved    Target Date 04/20/19      PT SHORT TERM GOAL #2   Title Patient will report a reduction in pain to no greater than 6/10 over the prior week to demonstrate symptom improvement.    Baseline Pain is 10/10 at worst, 2/10 at best As of 2/22: Pt. had achieved this goal for 1 week as of 1/21 but may have infection or other insult that caused pain to increase again. As of 4/29: Pain high of 8/10 but has been over-doing her activity over the  past week. As of 6/10: Pt. was able to keep pain at 6 or below since prior visit by using TENS to help keep tension lower but has not had a full week to test if it will keep working. As of 7/6: Pt. had met this goal but has a new UTI and it caused pain to spike to 8/10 over the weekend. As of 11/9: Pt. has just finally made progress with long-standing UTI and new POC for treatment so she is still having pain spikes up to 8/10 but we expect greater improvement over the next few months. As of 12/30: still feels like she is trending in a better direction but over-did it over the holidays and pain spiked to 8/10. No higher than 6/10 over last 2 days, even with PMS cramping. 3/14: 8/10 pain from bladder spasm    Time 5    Period Weeks    Status On-going    Target Date 08/02/20      PT SHORT TERM GOAL #3   Title Patient will demonstrate HEP x1 in the clinic to demonstrate understanding and proper form to allow for further improvement.    Baseline Pt. lacks knowledge of therepeutic exercises that can decrease her pain/Sx.    Time 5    Period Weeks    Status Achieved    Target Date 04/20/19      PT SHORT TERM GOAL #4   Title  Patient will report consistent use of foot-stool (squatty-potty) for positioning with BM to decrease pain with BM and intra-abdominal pressure.    Baseline Pt. having constipation due to PFM dysfunction    Time 5    Period Weeks    Status Achieved    Target Date 04/20/19             PT Long Term Goals - 06/28/20 0001      PT LONG TERM GOAL #1   Title Pt. will be able to participate in regular ADL's with least restrictive device without pain increasing greater than 2/10    Baseline Pt. limited in her ability to perform household duties by increased pain and fatigue. As of 4/29: Her legs have more endurance with walking before she gets a tremor. Still needs to sit down the majority of the time she is cooking. Still needs back support for prolonged sitting. As of 6/10: Pt. able to do for  ~1 hour before she needs to rest to prevent increased pain, pain is averaging ~ 4-5/10 3/14: able to perform ADLs with less pain, does still have bad days    Time 12    Period Weeks    Status On-going    Target Date 09/20/20      PT LONG TERM GOAL #2   Title Patient will score at or below 65/300  on the PFDI and 35% on the Female NIH-CPSI to demonstrate a clinically meaningful decrease in disability and distress due to pelvic floor dysfunction.    Baseline PFDI: 110/300, Female NIH-CPSI: 29/43 (67%) As of 4/29: 100/300 on PFDI and 30/43 on NIH-CPSI As of 7/6:  NIH-CPSI: 32/43 POPDI: 61/100, CRADI-8: 29/100, UDI: 44/100 (PFDI: 149/300), As of 04/15/20: Female NIH-CPSI: 29/43, PFDI: 84/300; 3/14: cmomposite 126/300    Time 12    Period Weeks    Status On-going    Target Date 09/20/20      PT LONG TERM GOAL #3   Title Patient will report no pain with intercourse to demonstrate improved functional ability.  Baseline Pt. Having significant pain with intercourse. As of 4/29: is a little better (20-30%) but is worse on days where overall tension/pain is worse. As of 6/10: can have days where there is not  much pain but other days where there is significant pain. As of 11/9: same as previous update. AS of 04/15/20: 25% improved from base-line 3/14: to be asssessed next session by pelvic therapist    Time 12    Period Weeks    Status Partially Met    Target Date 09/20/20      PT LONG TERM GOAL #5   Title Pt. will be able to go 2-3 hours between emptying her bladder without increased pain and empty her bladder fully whether by urostomy, cath, or voluntary release.    Baseline Pt. has a urostomy but continues to have to self-catheterize. Has pain with bladder filling and when using catheter. As of 4/29: is doing better since starting to take the pregabalin andd baclofen but occasionally still has pain with bladder filling. It is bad when she has an infection or over-does activity, better on "normal" days which have been few and far between due to frequent UTI's. As of 6/10: Pt. has "good" and "bad" days but has more days where she can go 2-3 hours without increased pain. As of 04/15/20: able to wait 2 hours, not 3 yet 3/14: somedays able to meet this goals, some days is more frequent    Time 12    Period Weeks    Status Partially Met    Target Date 09/20/20      PT LONG TERM GOAL #6   Title Patient will report having BM's at least every-other day with consistency between [facility] stool scale 3-5 over the prior week to demonstrate decreased constipation.    Baseline Pt. unable to have regula BM's without medication, manometry shows PFM dysfunction. As of 4/29: still having to use and enema or supository occasionally and still needing linzess. As of 6/10: Pt. has days where she can empty ok and days where she needs to use an Enema to stimulate a BM, feels that it is just the insertion of the enema, not the solution that does the job. As of 11/9: Pt. not having constipation due to colonic infection that has shifted her toward diarrhea. Having BM's daily, sometimes type 5. Has had diarrhea more than  constipation, still had to take linzess 1 time to go after 2 days without a BM. 3/14: to be assessed by pelvic therapist    Time 12    Period Weeks    Status On-going    Target Date 09/20/20      PT LONG TERM GOAL #7   Title Pt. will drive to a meaningful activity to demonstrate increased function and participation    Baseline Pt. has not been able to drive due to decreased LE control 3/134: still not driving at this time    Time 12    Period Weeks    Status On-going    Target Date 09/20/20      PT LONG TERM GOAL #8   Title Pt. will be able to perform 1 hr. of light housework reqiring intermittent standing and walking without increased pain/fatigue >2 pts.    Baseline Pt. becomes fatigued washing fruits/vegetbles in small stages and has fatigue>3 pts. 3/14: able to perform but needs rest breaks    Time 12    Period Weeks    Status Partially Met    Target  Date 09/20/20      PT LONG TERM GOAL  #9   TITLE Pt. will be able to attend a full athletic event for her children without Sx increasing by more than 2 pts.    Baseline Pt. having migraine or severe bladder spasms and fatigue and back pain> 3 pt. increase with attendance at athletic events 3/14: has increased pain but is able to attend now, has to use TENS unit    Time 12    Period Weeks    Status Partially Met    Target Date 09/20/20                 Plan - 06/28/20 1046    Clinical Impression Statement Patient is progressing towards functional goals with less pain and improved functional mobility for ADL performance. Pain continues to be present, especially when her bladder spasms however is becoming more tolerable. She continues to require strengthening and stabilization interventions to increase her functional mobility and decrease her risk for falls. Myoclonic jerking of LE's progressively worsens as she fatigues and will continue to be an area of focus. Patient's condition has the potential to improve in response to  therapy. Maximum improvement is yet to be obtained. The anticipated improvement is attainable and reasonable in a generally predictable time.Patient will benefit from skilled physical therapy to increase strength, stability, and decrease fall risk for improved quality of life.    Personal Factors and Comorbidities Comorbidity 3+    Comorbidities neurologic insult with pending diagnosis, multiple recurrent UTI's, BLE weakness, depression    Examination-Activity Limitations Caring for Others;Carry;Continence;Squat;Stairs;Toileting;Locomotion Level;Lift;Stand    Examination-Participation Restrictions Church;Cleaning;Community Activity;Driving;Interpersonal Relationship;Laundry;Yard Work;Meal Prep    Stability/Clinical Decision Making Unstable/Unpredictable    Rehab Potential Fair    PT Frequency 2x / week    PT Duration 12 weeks    PT Treatment/Interventions ADLs/Self Care Home Management;Aquatic Therapy;Biofeedback;Moist Heat;Electrical Stimulation;Cryotherapy;Traction;Ultrasound;Therapeutic activities;Functional mobility training;Stair training;Gait training;Therapeutic exercise;Balance training;Neuromuscular re-education;Patient/family education;Manual techniques;Dry needling;Scar mobilization;Energy conservation;Taping;Joint Manipulations;Spinal Manipulations;Visual/perceptual remediation/compensation;Passive range of motion    PT Next Visit Plan use balance game for 5 min., ask about stationary bike success/fatigue levels. Ask about taping/re-apply, further suprapubic scar work, deep-core, scapular tracking/strength, and balance, MFR to lower abdomen, Review pain diary, cupping to medial thighs and upper back, re-assess neural glides on LLE, focus on graded activity, and seated and standing balance/core strength. review I's Y's and T's in prone, thoracic mobility? perform further release and PNF for R>L shoulder blades, MFR around B ilium. continue to work on thoracolumbar rotation ROM and decreasing  spasms    PT Home Exercise Plan A day: low back stretch, side-stretch, hamstring stretch, butterfly stretch, hip EXT in prone with knee bent, standing hip ABD/EXT, calf raises. B day: Chest-stretch, Thoracic extensions over a towel roll, seated rows and seated scapular retraction (pull shoulder blades together like "T's") with band, child's pose stretch, self internal TP release (especially when Pelvic floor spasms high), Every day: Pain diary, Posterior pelvic tilts with deep core activation and red band for obliques activation, bow-and-arrow, TENS unit 3+ times per day for 30 min and as needed if pain starts to increase. TENS at ASIS/PSIS at strong tingle for 1 hr. per day. Sleeper stretch PRN, vagal nerve toning    Consulted and Agree with Plan of Care Patient           Patient will benefit from skilled therapeutic intervention in order to improve the following deficits and impairments:  Abnormal gait,Decreased balance,Decreased endurance,Decreased mobility,Difficulty walking,Increased muscle spasms,Impaired  sensation,Improper body mechanics,Impaired tone,Decreased activity tolerance,Decreased coordination,Decreased strength,Postural dysfunction,Pain  Visit Diagnosis: Other muscle spasm  Muscle weakness (generalized)  Difficulty in walking, not elsewhere classified     Problem List Patient Active Problem List   Diagnosis Date Noted  . Seizure ([place]) 11/11/2018  . Major depressive disorder, recurrent episode, moderate ([place]) 02/07/2018  . Nephrolithiasis 04/16/2016  . Numbness 07/28/2015  . Bladder retention 06/23/2015  . Abdominal pain 06/04/2015  . Dizziness 05/18/2015  . Neck pain 05/18/2015  . Complicated migraine [id number]  . Other fatigue 04/28/2015  . Abnormal finding on MRI of brain 04/28/2015  . D (diarrhea) 03/29/2015  . H/O disease 03/29/2015  . Abnormal weight loss 03/29/2015  . Muscle weakness (generalized) 03/14/2015  . Headache, migraine 03/10/2015   Janna Arch, PT, DPT   06/28/2020, 12:40 PM  [place] MAIN [facility] SERVICES [address] [place], [place], [address] Phone: [phone]   Fax:  [phone]  Name: Christine Chavez MRN: [id number] Date of Birth: 28-Nov-1980

## 2020-07-01 ENCOUNTER — Ambulatory Visit: Payer: BC Managed Care – PPO

## 2020-07-01 ENCOUNTER — Other Ambulatory Visit: Payer: Self-pay

## 2020-07-01 DIAGNOSIS — M62838 Other muscle spasm: Secondary | ICD-10-CM | POA: Diagnosis not present

## 2020-07-01 DIAGNOSIS — R262 Difficulty in walking, not elsewhere classified: Secondary | ICD-10-CM

## 2020-07-01 DIAGNOSIS — M6281 Muscle weakness (generalized): Secondary | ICD-10-CM

## 2020-07-01 NOTE — Therapy (Signed)
[place] MAIN [facility] SERVICES [address] [place], [place], [address] Phone: [phone]   Fax:  [phone]  Physical Therapy Treatment  The patient has been informed of current processes in place at Outpatient Rehab to protect patients from Covid-19 exposure including social distancing, schedule modifications, and new cleaning procedures. After discussing their particular risk with a therapist based on the patient's personal risk factors, the patient has decided to proceed with in-person therapy.  Patient Details  Name: Christine Chavez MRN: [id number] Date of Birth: 1980/08/05 No data recorded  Encounter Date: 07/01/2020   PT End of Session - 07/01/20 1126    Visit Number 91    Number of Visits 114    Date for PT Re-Evaluation 09/20/20    Authorization Type [employer]; 3 session balance PT    Authorization Time Period from 04/15/20 through 06/24/20    Authorization - Visit Number 11    Authorization - Number of Visits 20    Progress Note Due on Visit 98    PT Start Time 1030    PT Stop Time 1130    PT Time Calculation (min) 60 min    Activity Tolerance Patient tolerated treatment well;No increased pain    Behavior During Therapy WFL for tasks assessed/performed           Past Medical History:  Diagnosis Date  . Complication of anesthesia    ? seizures after anesthesia   . Headache   . Migraines   . Neurogenic bladder   . Renal disorder   . Vision abnormalities     Past Surgical History:  Procedure Laterality Date  . ANTERIOR CRUCIATE LIGAMENT REPAIR  1997  . APPENDECTOMY    . COLONOSCOPY WITH PROPOFOL N/A 11/11/2018   Procedure: COLONOSCOPY WITH PROPOFOL;  Surgeon: [name], MD;  Location: [facility] ENDOSCOPY;  Service: Endoscopy;  Laterality: N/A;  . CYSTOSCOPY WITH STENT PLACEMENT Right 04/17/2016   Procedure: CYSTOSCOPY WITH STENT PLACEMENT;  Surgeon: [name], MD;  Location: [facility] ORS;  Service: Urology;   Laterality: Right;  . ESOPHAGOGASTRODUODENOSCOPY (EGD) WITH PROPOFOL N/A 11/11/2018   Procedure: ESOPHAGOGASTRODUODENOSCOPY (EGD) WITH PROPOFOL;  Surgeon: [name], MD;  Location: [facility] ENDOSCOPY;  Service: Endoscopy;  Laterality: N/A;  . EXPLORATORY LAPAROTOMY  1999  . KIDNEY STONE SURGERY  04/2016  . REVISION UROSTOMY CUTANEOUS    . REVISION UROSTOMY CUTANEOUS  01/10/2018  . SUPRAPUBIC CATHETER PLACEMENT  08/2017  . TONSILLECTOMY      There were no vitals filed for this visit.  Pelvic Floor Physical Therapy Treatment Note  SCREENING  Changes in medications, allergies, or medical history?:   SUBJECTIVE  Patient reports: She has had awful spasms this morning, last night and overnight. She was able to sleep but woke up twice. Since coming off of the hiprex the back pain has been better but she feels like she may have a UTI again. She had been doing better with the spasms while she was on the hiprex and felt better with what we did with the TPDN and e-stim last time.   Precautions:  Neurological diagnosis pending, has "non-specific" brain lesions   Pain update:  Location of pain: LB and neck/shoulders (bladder) Current pain:  5/10) (5/10) Max pain: 8/10 (8/10 when it spasms) Least pain:  3/10 (3/10) Nature of pain: sharp to dull ache (grabbing)  ** Pt. Reports less TTP and improved stability with standing after treatment.  Patient Goals: Get rid of the discomfort  with catheterizing and ache/bladder pain. Be able to take fewer medications and be able to enjoy spending time with her family again, doing "all the mom things"    OBJECTIVE  Changes in:  Observations/Posture:  L lumbar curve prominent with transverse processes more prominent on the L. (from previous note)  C3 spinous process deviated L through C2-3. (from previous note)  12/30: L up-slip and anterior rotation **following treatment L iliacus remained tight, not allowing for complete up-slip or  rotation correction.  TODAY: PSIS appear level in prone, L5 spinous process prominent.   Range of Motion/Flexibilty:  (from 03/27/19) Spine: ~ 2-3 fingers from knee with SB B. ~ 50% reduced R rotation with stretch in L, ~ 75% reduced L rotation with increased tightness. HS: able to achieve 90 deg. Hip flexion in seated HS stretch. (from prior treatment)  Decreased PIVM through Thoracic, lumbar, and sacral spine with greatest restriction at T3-6. (from prior session)  12/30:  no decreased mobility at the sacrum with attempt at manual correction for L anterior rotation, hip-flexor spasm keeping Pt. From approp  TODAY: Decreased scar mobility in the suprapubic region.   Strength/MMT:  LE MMT (from 03-27-19) LE MMT Left Right  Hip flex:  (L2) /5 /5  Hip ext: 3+/5 3+/5  Hip abd: 4/5 4/5  Hip add: 4/5 3+/5  Hip IR 4/5 3+/5  Hip ER 4/5 4/5   (From 3/24): Pt. Demonstrates 3/5 strength with "I's, Y's, and T's" but 4/5 with W's. (from 6/22) DF: 9 reps with B. Eversion/Inversion: 4/5 on R, 4+/5 on L. DF: L 4+/5, R 3+/5  Seated IR/ER on L: 4/5, R 3+/5 (from 8-5) L DF and Eversion 4+/5 with first 1-3 reps and fatigue decreased to 4/4. (9/9) Pt. Demonstrates ~ 4+/5 B with increased spasticity B.   Balance/stability:  (6-29) Pt. Able to achieve good posture without cueing but only able to maintain for ~ 2.5 min. Before seeking stability from the table and forward rolled shoulders. With cue to engage the deep core, Pt. Recognizes increased stability and comfort.  8/5: Pt. Demonstrating decreased stability in gait and standing balance today with fasciculations increasing in direct relation to muscular endurance required. Able to stand ~ 10 sec. In SLS with MIN UE support before intensity of spasms make attempt at balance futile.   12/30: Pt. Reports feeling less stable on LLE again, similar to August but less intense.   1/31: Able to stand for ~ 10 sec. In double leg stance and on RLE with no  LOB the first attempt but required TC and VC for L glute med. Activation for LLE. Second attempt less cueing recuired for LLE but far more fasiculations noted. Was able to stand with slow perterbations in multiple directions applied to the shoulders for ~ 20 sec. Before fatigue slowed reaction time.  2/17: performed circle maze and square maze on balance machine with BUE support. Circle: 134 sec. -4 pts. Square: 137 sec. And -17 pts.   Pelvic Floor External Exam: (From prior session)- [Introitus Appears: elevated Skin integrity: WNL Palpation: TTP to B STP Cough: limited motion Prolapse visible?: no Scar mobility: not assessed  Internal Vaginal Exam: Strength (PERF): Pt. Has difficulty sensing the PFM to generate contraction or relaxation due to high tone/neuro involvement Symmetry: L>R for spasm/TTP Palpation: TTP to all muscles throughout B Prolapse: none]  unable to coordinate squeeze and TTP to all areas with the greatest at B OI and coccygeus. ** following TP release Pt. Able to  achieve 1/5 PFM contraction. Decreased TTP at B anterior PR/PC following treatment. (10/14).  TODAY: not re-assessed today.  Abdominal:  Pt. Demonstrated ability to recruit R>L obliques and TA with pelvic tilts. (from prior session)  Palpation: Decreased fascial and scar mobility through lower abdomen with radiating Sx. To the pelvis and LB. (from prior session)  12/30: TTP to L QL and Iliacus  Murphy's punch sign: Some sharp pain that shot forward with percussion to B kidneys. (from 06/03/20)   TODAY: TTP to B erector spinae and multifidus from L3-5  Gait Analysis:  (04/22/20) 2 min. Walk test: 12 meters on 1st attempt. 7.75 meters on 2nd attempt. (video)  Pt. Demonstrated crouched walk with difficulty controlling where she placed the R>L LE and ~ 3-4 near LOB while walking on 2nd attempt with PT at  [facility] and using Rollator  (Pt. Feels like this would have been better 2-3 days ago  even)  2/3: Pt. Walked ~ 50 ft. x2 With rollator and gait belt. Attempted to walk to metronome cadence but with difficulty, ended up keeping to ~ 1/2 time of the tempo with ~ 50% consistency. Some difficulty with control noted but less than during initial walk test.  INTERVENTIONS THIS SESSION: Manual: Performed MFR to suprapubic scar followed TP release and STM to B erector spinae and multifidus from L3-5  to decrease spasm and pain and allow for improved balance of musculature for improved function and decreased symptoms.  Dry-needle: Performed TPDN with 4 .30x68m needles and standard approach combined with E-stim as described below to decrease spasm and pain and allow for improved balance of musculature for improved function and decreased symptoms.  E-stim: Performed attended E-stim with TPDN across L3-5 at a frequency of 2 and intensity of ~ 246mfor 15 min. To decrease spasm and pain and help improve neural flow.  Total time: 60 min.                                   PT Short Term Goals - 06/28/20 1233      PT SHORT TERM GOAL #1   Title Patient will demonstrate coordinated diaphragmatic breathing with pelvic tilts to demonstrate improved control of diaphragm and TA, to allow for further strengthening of core musculature and decreased pelvic floor spasm.    Baseline Pt. demonstrates breathing dysfunction and poor PFM coordination evidenced by anal manometry As of 2/22: Pt. continues to have restriction in her diaphragm and near T/L junction but is able to intentionally use diaphragmatic breathing through the available ROM.    Time 5    Period Weeks    Status Achieved    Target Date 04/20/19      PT SHORT TERM GOAL #2   Title Patient will report a reduction in pain to no greater than 6/10 over the prior week to demonstrate symptom improvement.    Baseline Pain is 10/10 at worst, 2/10 at best As of 2/22: Pt. had achieved this goal for 1 week as of 1/21 but  may have infection or other insult that caused pain to increase again. As of 4/29: Pain high of 8/10 but has been over-doing her activity over the past week. As of 6/10: Pt. was able to keep pain at 6 or below since prior visit by using TENS to help keep tension lower but has not had a full week to test if it will keep working. As of 7/6:  Pt. had met this goal but has a new UTI and it caused pain to spike to 8/10 over the weekend. As of 11/9: Pt. has just finally made progress with long-standing UTI and new POC for treatment so she is still having pain spikes up to 8/10 but we expect greater improvement over the next few months. As of 12/30: still feels like she is trending in a better direction but over-did it over the holidays and pain spiked to 8/10. No higher than 6/10 over last 2 days, even with PMS cramping. 3/14: 8/10 pain from bladder spasm    Time 5    Period Weeks    Status On-going    Target Date 08/02/20      PT SHORT TERM GOAL #3   Title Patient will demonstrate HEP x1 in the clinic to demonstrate understanding and proper form to allow for further improvement.    Baseline Pt. lacks knowledge of therepeutic exercises that can decrease her pain/Sx.    Time 5    Period Weeks    Status Achieved    Target Date 04/20/19      PT SHORT TERM GOAL #4   Title Patient will report consistent use of foot-stool (squatty-potty) for positioning with BM to decrease pain with BM and intra-abdominal pressure.    Baseline Pt. having constipation due to PFM dysfunction    Time 5    Period Weeks    Status Achieved    Target Date 04/20/19             PT Long Term Goals - 06/28/20 0001      PT LONG TERM GOAL #1   Title Pt. will be able to participate in regular ADL's with least restrictive device without pain increasing greater than 2/10    Baseline Pt. limited in her ability to perform household duties by increased pain and fatigue. As of 4/29: Her legs have more endurance with walking before she  gets a tremor. Still needs to sit down the majority of the time she is cooking. Still needs back support for prolonged sitting. As of 6/10: Pt. able to do for  ~1 hour before she needs to rest to prevent increased pain, pain is averaging ~ 4-5/10 3/14: able to perform ADLs with less pain, does still have bad days    Time 12    Period Weeks    Status On-going    Target Date 09/20/20      PT LONG TERM GOAL #2   Title Patient will score at or below 65/300  on the PFDI and 35% on the Female NIH-CPSI to demonstrate a clinically meaningful decrease in disability and distress due to pelvic floor dysfunction.    Baseline PFDI: 110/300, Female NIH-CPSI: 29/43 (67%) As of 4/29: 100/300 on PFDI and 30/43 on NIH-CPSI As of 7/6:  NIH-CPSI: 32/43 POPDI: 61/100, CRADI-8: 29/100, UDI: 44/100 (PFDI: 149/300), As of 04/15/20: Female NIH-CPSI: 29/43, PFDI: 84/300; 3/14: cmomposite 126/300    Time 12    Period Weeks    Status On-going    Target Date 09/20/20      PT LONG TERM GOAL #3   Title Patient will report no pain with intercourse to demonstrate improved functional ability.    Baseline Pt. Having significant pain with intercourse. As of 4/29: is a little better (20-30%) but is worse on days where overall tension/pain is worse. As of 6/10: can have days where there is not much pain but other days where there is  significant pain. As of 11/9: same as previous update. AS of 04/15/20: 25% improved from base-line 3/14: to be asssessed next session by pelvic therapist    Time 12    Period Weeks    Status Partially Met    Target Date 09/20/20      PT LONG TERM GOAL #5   Title Pt. will be able to go 2-3 hours between emptying her bladder without increased pain and empty her bladder fully whether by urostomy, cath, or voluntary release.    Baseline Pt. has a urostomy but continues to have to self-catheterize. Has pain with bladder filling and when using catheter. As of 4/29: is doing better since starting to take the  pregabalin andd baclofen but occasionally still has pain with bladder filling. It is bad when she has an infection or over-does activity, better on "normal" days which have been few and far between due to frequent UTI's. As of 6/10: Pt. has "good" and "bad" days but has more days where she can go 2-3 hours without increased pain. As of 04/15/20: able to wait 2 hours, not 3 yet 3/14: somedays able to meet this goals, some days is more frequent    Time 12    Period Weeks    Status Partially Met    Target Date 09/20/20      PT LONG TERM GOAL #6   Title Patient will report having BM's at least every-other day with consistency between [facility] stool scale 3-5 over the prior week to demonstrate decreased constipation.    Baseline Pt. unable to have regula BM's without medication, manometry shows PFM dysfunction. As of 4/29: still having to use and enema or supository occasionally and still needing linzess. As of 6/10: Pt. has days where she can empty ok and days where she needs to use an Enema to stimulate a BM, feels that it is just the insertion of the enema, not the solution that does the job. As of 11/9: Pt. not having constipation due to colonic infection that has shifted her toward diarrhea. Having BM's daily, sometimes type 5. Has had diarrhea more than constipation, still had to take linzess 1 time to go after 2 days without a BM. 3/14: to be assessed by pelvic therapist    Time 12    Period Weeks    Status On-going    Target Date 09/20/20      PT LONG TERM GOAL #7   Title Pt. will drive to a meaningful activity to demonstrate increased function and participation    Baseline Pt. has not been able to drive due to decreased LE control 3/134: still not driving at this time    Time 12    Period Weeks    Status On-going    Target Date 09/20/20      PT LONG TERM GOAL #8   Title Pt. will be able to perform 1 hr. of light housework reqiring intermittent standing and walking without increased  pain/fatigue >2 pts.    Baseline Pt. becomes fatigued washing fruits/vegetbles in small stages and has fatigue>3 pts. 3/14: able to perform but needs rest breaks    Time 12    Period Weeks    Status Partially Met    Target Date 09/20/20      PT LONG TERM GOAL  #9   TITLE Pt. will be able to attend a full athletic event for her children without Sx increasing by more than 2 pts.    Baseline Pt. having  migraine or severe bladder spasms and fatigue and back pain> 3 pt. increase with attendance at athletic events 3/14: has increased pain but is able to attend now, has to use TENS unit    Time 12    Period Weeks    Status Partially Met    Target Date 09/20/20                 Plan - 07/01/20 1128    Clinical Impression Statement Pt. Responded well to all interventions today, demonstrating improved scar mobility, decreased spasm and TTP, as well as understanding and correct performance of all education and exercises provided today. They will continue to benefit from skilled physical therapy to work toward remaining goals and maximize function as well as decrease likelihood of symptom increase or recurrence.   PT Next Visit Plan use balance game for 5 min., ask about stationary bike success/fatigue levels. Ask about taping/re-apply, further suprapubic scar work, deep-core, scapular tracking/strength, and balance, MFR to lower abdomen, Review pain diary, cupping to medial thighs and upper back, re-assess neural glides on LLE, focus on graded activity, and seated and standing balance/core strength. review I's Y's and T's in prone, thoracic mobility? perform further release and PNF for R>L shoulder blades, MFR around B ilium. continue to work on thoracolumbar rotation ROM and decreasing spasms    PT Home Exercise Plan A day: low back stretch, side-stretch, hamstring stretch, butterfly stretch, hip EXT in prone with knee bent, standing hip ABD/EXT, calf raises. B day: Chest-stretch, Thoracic extensions  over a towel roll, seated rows and seated scapular retraction (pull shoulder blades together like "T's") with band, child's pose stretch, self internal TP release (especially when Pelvic floor spasms high), Every day: Pain diary, Posterior pelvic tilts with deep core activation and red band for obliques activation, bow-and-arrow, TENS unit 3+ times per day for 30 min and as needed if pain starts to increase. TENS at ASIS/PSIS at strong tingle for 1 hr. per day. Sleeper stretch PRN, vagal nerve toning    Consulted and Agree with Plan of Care Patient           Patient will benefit from skilled therapeutic intervention in order to improve the following deficits and impairments:     Visit Diagnosis: Other muscle spasm  Muscle weakness (generalized)  Difficulty in walking, not elsewhere classified     Problem List Patient Active Problem List   Diagnosis Date Noted  . Seizure ([place]) 11/11/2018  . Major depressive disorder, recurrent episode, moderate ([place]) 02/07/2018  . Nephrolithiasis 04/16/2016  . Numbness 07/28/2015  . Bladder retention 06/23/2015  . Abdominal pain 06/04/2015  . Dizziness 05/18/2015  . Neck pain 05/18/2015  . Complicated migraine 51/88/4166  . Other fatigue 04/28/2015  . Abnormal finding on MRI of brain 04/28/2015  . D (diarrhea) 03/29/2015  . H/O disease 03/29/2015  . Abnormal weight loss 03/29/2015  . Muscle weakness (generalized) 03/14/2015  . Headache, migraine 03/10/2015   Willa Rough DPT, ATC Willa Rough 07/01/2020, 11:28 AM  [place] MAIN [facility] SERVICES [address] [place], [place], [address] Phone: [phone]   Fax:  [phone]  Name: Christine Chavez MRN: [id number] Date of Birth: 06-14-1980

## 2020-07-05 ENCOUNTER — Other Ambulatory Visit: Payer: Self-pay

## 2020-07-05 ENCOUNTER — Ambulatory Visit: Payer: BC Managed Care – PPO

## 2020-07-05 DIAGNOSIS — M62838 Other muscle spasm: Secondary | ICD-10-CM

## 2020-07-05 DIAGNOSIS — M6281 Muscle weakness (generalized): Secondary | ICD-10-CM

## 2020-07-05 DIAGNOSIS — R262 Difficulty in walking, not elsewhere classified: Secondary | ICD-10-CM

## 2020-07-05 NOTE — Therapy (Signed)
[place] [facility] MAIN [facility] SERVICES [address] [place], [place], [address] Phone: [phone]   Fax:  [phone]  Physical Therapy Treatment  Patient Details  Name: Christine Chavez MRN: [id number] Date of Birth: October 22, 1980 No data recorded  Encounter Date: 07/05/2020   PT End of Session - 07/05/20 1043    Visit Number 92    Number of Visits 114    Date for PT Re-Evaluation 09/20/20    Authorization Type [employer]; 3 session balance PT    Authorization Time Period 06/28/20-09/20/20    PT Start Time 1031    PT Stop Time 1111    PT Time Calculation (min) 40 min    Activity Tolerance Patient tolerated treatment well;No increased pain    Behavior During Therapy WFL for tasks assessed/performed           Past Medical History:  Diagnosis Date  . Complication of anesthesia    ? seizures after anesthesia   . Headache   . Migraines   . Neurogenic bladder   . Renal disorder   . Vision abnormalities     Past Surgical History:  Procedure Laterality Date  . ANTERIOR CRUCIATE LIGAMENT REPAIR  1997  . APPENDECTOMY    . COLONOSCOPY WITH PROPOFOL N/A 11/11/2018   Procedure: COLONOSCOPY WITH PROPOFOL;  Surgeon: Christena Deem, MD;  Location: [facility] ENDOSCOPY;  Service: Endoscopy;  Laterality: N/A;  . CYSTOSCOPY WITH STENT PLACEMENT Right 04/17/2016   Procedure: CYSTOSCOPY WITH STENT PLACEMENT;  Surgeon: Malen Gauze, MD;  Location: [facility] ORS;  Service: Urology;  Laterality: Right;  . ESOPHAGOGASTRODUODENOSCOPY (EGD) WITH PROPOFOL N/A 11/11/2018   Procedure: ESOPHAGOGASTRODUODENOSCOPY (EGD) WITH PROPOFOL;  Surgeon: Christena Deem, MD;  Location: [facility] ENDOSCOPY;  Service: Endoscopy;  Laterality: N/A;  . EXPLORATORY LAPAROTOMY  1999  . KIDNEY STONE SURGERY  04/2016  . REVISION UROSTOMY CUTANEOUS    . REVISION UROSTOMY CUTANEOUS  01/10/2018  . SUPRAPUBIC CATHETER PLACEMENT  08/2017  . TONSILLECTOMY      There were no vitals filed for this  visit.   Subjective Assessment - 07/05/20 1035    Subjective Pt reports no big updates over the weekend. She conitnues to have flank pain bilat, concerns she may have a UTI, will contact MD. HEP going well.    Currently in Pain? Yes    Pain Score 5            INTERVENTION THIS DATE:  *STS from transport chair to // bars c rollator  -FWD/Backward AMB in // bars 1 round trip  -seated rest 2-3 minute  -FWD/Backward AMB in // bars 1 round trip  -seated rest 2-3 minute -lateral side stepping in // bar 1 round trip -seated rest break 2- -lateral side stepping in // bar 1 round trip -seated rest break 2-  Rt hip screening- c/o ongoing locking up at home -c/o pain and tightness gesturing from Rt greater trochanter to posterolateral iliac crest, denies feeling akin to muscle pain or stretch -has ROM limitatons (~20%) and concurrent symptomology c Rt hip horizontal abduction (frog stretch in hooklying) and end range hip flexion in sagittal plane.  -concurrent symptoms with end range external rotation of the Rt hip at 90 degrees of flexion -otherwise IR/ER ROM near identical to contralateral side  -deep palpation of anterior hip joint non tender, no suspicious of shallow socket -tenderness to deep palpation of lateral and anterior gluteus medius and anterior gluteus minimus, but unrelated to symptoms -no  significant tenderness to posterior gluteus minimus  -discomfort with longitudinal distraction of the Rt hip (performed twice)   -Hooklying Marching 1x30 alteranting sides, cues for slight abduction in flexion to better active psoas>short adductors   Supine to seated EOB modI  Transfer EOB to transport chair at supervision level     PT Short Term Goals - 06/28/20 1233      PT SHORT TERM GOAL #1   Title Patient will demonstrate coordinated diaphragmatic breathing with pelvic tilts to demonstrate improved control of diaphragm and TA, to allow for further strengthening of  core musculature and decreased pelvic floor spasm.    Baseline Pt. demonstrates breathing dysfunction and poor PFM coordination evidenced by anal manometry As of 2/22: Pt. continues to have restriction in her diaphragm and near T/L junction but is able to intentionally use diaphragmatic breathing through the available ROM.    Time 5    Period Weeks    Status Achieved    Target Date 04/20/19      PT SHORT TERM GOAL #2   Title Patient will report a reduction in pain to no greater than 6/10 over the prior week to demonstrate symptom improvement.    Baseline Pain is 10/10 at worst, 2/10 at best As of 2/22: Pt. had achieved this goal for 1 week as of 1/21 but may have infection or other insult that caused pain to increase again. As of 4/29: Pain high of 8/10 but has been over-doing her activity over the past week. As of 6/10: Pt. was able to keep pain at 6 or below since prior visit by using TENS to help keep tension lower but has not had a full week to test if it will keep working. As of 7/6: Pt. had met this goal but has a new UTI and it caused pain to spike to 8/10 over the weekend. As of 11/9: Pt. has just finally made progress with long-standing UTI and new POC for treatment so she is still having pain spikes up to 8/10 but we expect greater improvement over the next few months. As of 12/30: still feels like she is trending in a better direction but over-did it over the holidays and pain spiked to 8/10. No higher than 6/10 over last 2 days, even with PMS cramping. 3/14: 8/10 pain from bladder spasm    Time 5    Period Weeks    Status On-going    Target Date 08/02/20      PT SHORT TERM GOAL #3   Title Patient will demonstrate HEP x1 in the clinic to demonstrate understanding and proper form to allow for further improvement.    Baseline Pt. lacks knowledge of therepeutic exercises that can decrease her pain/Sx.    Time 5    Period Weeks    Status Achieved    Target Date 04/20/19      PT SHORT  TERM GOAL #4   Title Patient will report consistent use of foot-stool (squatty-potty) for positioning with BM to decrease pain with BM and intra-abdominal pressure.    Baseline Pt. having constipation due to PFM dysfunction    Time 5    Period Weeks    Status Achieved    Target Date 04/20/19             PT Long Term Goals - 06/28/20 0001      PT LONG TERM GOAL #1   Title Pt. will be able to participate in regular ADL's with least restrictive device  without pain increasing greater than 2/10    Baseline Pt. limited in her ability to perform household duties by increased pain and fatigue. As of 4/29: Her legs have more endurance with walking before she gets a tremor. Still needs to sit down the majority of the time she is cooking. Still needs back support for prolonged sitting. As of 6/10: Pt. able to do for  ~1 hour before she needs to rest to prevent increased pain, pain is averaging ~ 4-5/10 3/14: able to perform ADLs with less pain, does still have bad days    Time 12    Period Weeks    Status On-going    Target Date 09/20/20      PT LONG TERM GOAL #2   Title Patient will score at or below 65/300  on the PFDI and 35% on the Female NIH-CPSI to demonstrate a clinically meaningful decrease in disability and distress due to pelvic floor dysfunction.    Baseline PFDI: 110/300, Female NIH-CPSI: 29/43 (67%) As of 4/29: 100/300 on PFDI and 30/43 on NIH-CPSI As of 7/6:  NIH-CPSI: 32/43 POPDI: 61/100, CRADI-8: 29/100, UDI: 44/100 (PFDI: 149/300), As of 04/15/20: Female NIH-CPSI: 29/43, PFDI: 84/300; 3/14: cmomposite 126/300    Time 12    Period Weeks    Status On-going    Target Date 09/20/20      PT LONG TERM GOAL #3   Title Patient will report no pain with intercourse to demonstrate improved functional ability.    Baseline Pt. Having significant pain with intercourse. As of 4/29: is a little better (20-30%) but is worse on days where overall tension/pain is worse. As of 6/10: can have days  where there is not much pain but other days where there is significant pain. As of 11/9: same as previous update. AS of 04/15/20: 25% improved from base-line 3/14: to be asssessed next session by pelvic therapist    Time 12    Period Weeks    Status Partially Met    Target Date 09/20/20      PT LONG TERM GOAL #5   Title Pt. will be able to go 2-3 hours between emptying her bladder without increased pain and empty her bladder fully whether by urostomy, cath, or voluntary release.    Baseline Pt. has a urostomy but continues to have to self-catheterize. Has pain with bladder filling and when using catheter. As of 4/29: is doing better since starting to take the pregabalin andd baclofen but occasionally still has pain with bladder filling. It is bad when she has an infection or over-does activity, better on "normal" days which have been few and far between due to frequent UTI's. As of 6/10: Pt. has "good" and "bad" days but has more days where she can go 2-3 hours without increased pain. As of 04/15/20: able to wait 2 hours, not 3 yet 3/14: somedays able to meet this goals, some days is more frequent    Time 12    Period Weeks    Status Partially Met    Target Date 09/20/20      PT LONG TERM GOAL #6   Title Patient will report having BM's at least every-other day with consistency between [facility] stool scale 3-5 over the prior week to demonstrate decreased constipation.    Baseline Pt. unable to have regula BM's without medication, manometry shows PFM dysfunction. As of 4/29: still having to use and enema or supository occasionally and still needing linzess. As of 6/10: Pt. has  days where she can empty ok and days where she needs to use an Enema to stimulate a BM, feels that it is just the insertion of the enema, not the solution that does the job. As of 11/9: Pt. not having constipation due to colonic infection that has shifted her toward diarrhea. Having BM's daily, sometimes type 5. Has had diarrhea  more than constipation, still had to take linzess 1 time to go after 2 days without a BM. 3/14: to be assessed by pelvic therapist    Time 12    Period Weeks    Status On-going    Target Date 09/20/20      PT LONG TERM GOAL #7   Title Pt. will drive to a meaningful activity to demonstrate increased function and participation    Baseline Pt. has not been able to drive due to decreased LE control 3/134: still not driving at this time    Time 12    Period Weeks    Status On-going    Target Date 09/20/20      PT LONG TERM GOAL #8   Title Pt. will be able to perform 1 hr. of light housework reqiring intermittent standing and walking without increased pain/fatigue >2 pts.    Baseline Pt. becomes fatigued washing fruits/vegetbles in small stages and has fatigue>3 pts. 3/14: able to perform but needs rest breaks    Time 12    Period Weeks    Status Partially Met    Target Date 09/20/20      PT LONG TERM GOAL  #9   TITLE Pt. will be able to attend a full athletic event for her children without Sx increasing by more than 2 pts.    Baseline Pt. having migraine or severe bladder spasms and fatigue and back pain> 3 pt. increase with attendance at athletic events 3/14: has increased pain but is able to attend now, has to use TENS unit    Time 12    Period Weeks    Status Partially Met    Target Date 09/20/20                 Plan - 07/05/20 1046    Clinical Impression Statement Continued with current plan of care as laid out in evaluation and recent prior sessions. Pt remains motivated to advance progress toward goals. Rest breaks provided as needed, pt quick to ask when needed. Used multiplane gait training today for closed chain hip/ankle motor control training. Screened Rt hip as pt reports recent development of locking/catching phenomenon without any clear etiology or causal events. Screening indicates high likelihood for impingement of the femoroacetabular joint, however difficult to  determine if intraarticular versus extraarticular. Discussed with patient, will continue to monitor for signs of progression. Pt continues to demonstrate progress toward goals AEB progression of some interventions this date either in volume or intensity.    Personal Factors and Comorbidities Comorbidity 3+    Comorbidities neurologic insult with pending diagnosis, multiple recurrent UTI's, BLE weakness, depression    Examination-Activity Limitations Caring for Others;Carry;Continence;Squat;Stairs;Toileting;Locomotion Level;Lift;Stand    Examination-Participation Restrictions Church;Cleaning;Community Activity;Driving;Interpersonal Relationship;Laundry;Yard Work;Meal Prep    Stability/Clinical Decision Making Unstable/Unpredictable    Clinical Decision Making High    Rehab Potential Fair    PT Frequency 2x / week    PT Duration 12 weeks    PT Treatment/Interventions ADLs/Self Care Home Management;Aquatic Therapy;Biofeedback;Moist Heat;Electrical Stimulation;Cryotherapy;Traction;Ultrasound;Therapeutic activities;Functional mobility training;Stair training;Gait training;Therapeutic exercise;Balance training;Neuromuscular re-education;Patient/family education;Manual techniques;Dry needling;Scar mobilization;Energy conservation;Taping;Joint Manipulations;Spinal Manipulations;Visual/perceptual remediation/compensation;Passive  range of motion    PT Next Visit Plan use balance game for 5 min., ask about stationary bike success/fatigue levels. Ask about taping/re-apply, further suprapubic scar work, deep-core, scapular tracking/strength, and balance, MFR to lower abdomen, Review pain diary, cupping to medial thighs and upper back, re-assess neural glides on LLE, focus on graded activity, and seated and standing balance/core strength. review I's Y's and T's in prone, thoracic mobility? perform further release and PNF for R>L shoulder blades, MFR around B ilium. continue to work on thoracolumbar rotation ROM and  decreasing spasms    PT Home Exercise Plan A day: low back stretch, side-stretch, hamstring stretch, butterfly stretch, hip EXT in prone with knee bent, standing hip ABD/EXT, calf raises. B day: Chest-stretch, Thoracic extensions over a towel roll, seated rows and seated scapular retraction (pull shoulder blades together like "T's") with band, child's pose stretch, self internal TP release (especially when Pelvic floor spasms high), Every day: Pain diary, Posterior pelvic tilts with deep core activation and red band for obliques activation, bow-and-arrow, TENS unit 3+ times per day for 30 min and as needed if pain starts to increase. TENS at ASIS/PSIS at strong tingle for 1 hr. per day. Sleeper stretch PRN, vagal nerve toning    Consulted and Agree with Plan of Care Patient           Patient will benefit from skilled therapeutic intervention in order to improve the following deficits and impairments:  Abnormal gait,Decreased balance,Decreased endurance,Decreased mobility,Difficulty walking,Increased muscle spasms,Impaired sensation,Improper body mechanics,Impaired tone,Decreased activity tolerance,Decreased coordination,Decreased strength,Postural dysfunction,Pain  Visit Diagnosis: Other muscle spasm  Muscle weakness (generalized)  Difficulty in walking, not elsewhere classified     Problem List Patient Active Problem List   Diagnosis Date Noted  . Seizure ([place]) 11/11/2018  . Major depressive disorder, recurrent episode, moderate ([place]) 02/07/2018  . Nephrolithiasis 04/16/2016  . Numbness 07/28/2015  . Bladder retention 06/23/2015  . Abdominal pain 06/04/2015  . Dizziness 05/18/2015  . Neck pain 05/18/2015  . Complicated migraine 70/26/3785  . Other fatigue 04/28/2015  . Abnormal finding on MRI of brain 04/28/2015  . D (diarrhea) 03/29/2015  . H/O disease 03/29/2015  . Abnormal weight loss 03/29/2015  . Muscle weakness (generalized) 03/14/2015  . Headache, migraine 03/10/2015    12:27 PM, 07/05/20 Etta Grandchild, PT, DPT Physical Therapist - [facility]  Outpatient Physical [place] [phone]    Etta Grandchild 07/05/2020, 10:50 AM  [place] MAIN [facility] SERVICES [address] [place], [place], [address] Phone: [phone]   Fax:  [phone]  Name: Christine Chavez MRN: [id number] Date of Birth: 12/08/1980

## 2020-07-08 ENCOUNTER — Other Ambulatory Visit: Payer: Self-pay

## 2020-07-08 ENCOUNTER — Ambulatory Visit: Payer: BC Managed Care – PPO

## 2020-07-08 DIAGNOSIS — M62838 Other muscle spasm: Secondary | ICD-10-CM | POA: Diagnosis not present

## 2020-07-08 DIAGNOSIS — M6281 Muscle weakness (generalized): Secondary | ICD-10-CM

## 2020-07-08 DIAGNOSIS — R262 Difficulty in walking, not elsewhere classified: Secondary | ICD-10-CM

## 2020-07-08 NOTE — Therapy (Signed)
[place] [facility] SERVICES [address] [place], [place], [address] Phone: [phone]   Fax:  [phone]  Physical Therapy Treatment  The patient has been informed of current processes in place at Outpatient Rehab to protect patients from Covid-19 exposure including social distancing, schedule modifications, and new cleaning procedures. After discussing their particular risk with a therapist based on the patient's personal risk factors, the patient has decided to proceed with in-person therapy.  Patient Details  Name: Christine Chavez MRN: [id number] Date of Birth: March 10, 1981 No data recorded  Encounter Date: 07/08/2020   PT End of Session - 07/08/20 1510    Visit Number 93    Number of Visits 114    Date for PT Re-Evaluation 09/20/20    Authorization Type [employer]; 3 session balance PT    Authorization Time Period 06/28/20-09/20/20    Authorization - Visit Number 12    Authorization - Number of Visits 20    Progress Note Due on Visit 98    PT Start Time 1030    PT Stop Time 1130    PT Time Calculation (min) 60 min    Activity Tolerance Patient tolerated treatment well;No increased pain    Behavior During Therapy WFL for tasks assessed/performed           Past Medical History:  Diagnosis Date  . Complication of anesthesia    ? seizures after anesthesia   . Headache   . Migraines   . Neurogenic bladder   . Renal disorder   . Vision abnormalities     Past Surgical History:  Procedure Laterality Date  . ANTERIOR CRUCIATE LIGAMENT REPAIR  1997  . APPENDECTOMY    . COLONOSCOPY WITH PROPOFOL N/A 11/11/2018   Procedure: COLONOSCOPY WITH PROPOFOL;  Surgeon: [name], MD;  Location: [facility] ENDOSCOPY;  Service: Endoscopy;  Laterality: N/A;  . CYSTOSCOPY WITH STENT PLACEMENT Right 04/17/2016   Procedure: CYSTOSCOPY WITH STENT PLACEMENT;  Surgeon: [name], MD;  Location: [facility] ORS;  Service: Urology;  Laterality: Right;  .  ESOPHAGOGASTRODUODENOSCOPY (EGD) WITH PROPOFOL N/A 11/11/2018   Procedure: ESOPHAGOGASTRODUODENOSCOPY (EGD) WITH PROPOFOL;  Surgeon: [name], MD;  Location[facility] ENDOSCOPY;  Service: Endoscopy;  Laterality: N/A;  . EXPLORATORY LAPAROTOMY  1999  . KIDNEY STONE SURGERY  04/2016  . REVISION UROSTOMY CUTANEOUS    . REVISION UROSTOMY CUTANEOUS  01/10/2018  . SUPRAPUBIC CATHETER PLACEMENT  08/2017  . TONSILLECTOMY      There were no vitals filed for this visit.  Pelvic Floor Physical Therapy Treatment Note  SCREENING  Changes in medications, allergies, or medical history?:   SUBJECTIVE  Patient reports: She has been having a hard time with her bladder but she knows some of it may be from her period cramping. When she pushes on the L side it feels worse. She is having the pain again wen she empties her bladder that shoots up through the abdomen. She did not sleep well on Tuesday night. Her pain in her back was also intense and it was bad enough that she thought she may have had to go to the hospital. She had to take a higher dose of the baclofen than normal but it did help some. She felt nauseated and bloated yesterday all day. Put the TENS unit on and put the pad a little lower on the left and it helped some but did not resolve anything.  Precautions:  Neurological diagnosis pending, has "non-specific" brain lesions  Pain update:  Location of pain: LB and neck/shoulders (bladder) Current pain:  5/10) (5/10) Max pain: 8/10 (8/10 when it spasms) Least pain:  3/10 (3/10) Nature of pain: sharp to dull ache (grabbing)  **Pt. Having exacerbation of migraine and unable to describe pain following treatment.  Patient Goals: Get rid of the discomfort with catheterizing and ache/bladder pain. Be able to take fewer medications and be able to enjoy spending time with her family again, doing "all the mom things"    OBJECTIVE  Changes in:  Observations/Posture:  L lumbar curve  prominent with transverse processes more prominent on the L. (from previous note)  C3 spinous process deviated L through C2-3. (from previous note)  12/30: L up-slip and anterior rotation **following treatment L iliacus remained tight, not allowing for complete up-slip or rotation correction.  07/01/20: PSIS appear level in prone, L5 spinous process prominent.   Range of Motion/Flexibilty:  (from 03/27/19) Spine: ~ 2-3 fingers from knee with SB B. ~ 50% reduced R rotation with stretch in L, ~ 75% reduced L rotation with increased tightness. HS: able to achieve 90 deg. Hip flexion in seated HS stretch. (from prior treatment)  Decreased PIVM through Thoracic, lumbar, and sacral spine with greatest restriction at T3-6. (from prior session)  12/30:  no decreased mobility at the sacrum with attempt at manual correction for L anterior rotation, hip-flexor spasm keeping Pt. From approp  07/01/20: Decreased scar mobility in the suprapubic region.   TODAY: Pt. Demonstrated decreased ROM in R hip IR/ER compared to L with empty end-feel.  **following treatment Pt. Able to demonstrate normal ROM without pain for IR/ER and no pain with aggravating motion of getting in/out of w/c or car.  Strength/MMT:  LE MMT (from 03-27-19) LE MMT Left Right  Hip flex:  (L2) /5 /5  Hip ext: 3+/5 3+/5  Hip abd: 4/5 4/5  Hip add: 4/5 3+/5  Hip IR 4/5 3+/5  Hip ER 4/5 4/5   (From 3/24): Pt. Demonstrates 3/5 strength with "I's, Y's, and T's" but 4/5 with W's. (from 6/22) DF: 9 reps with B. Eversion/Inversion: 4/5 on R, 4+/5 on L. DF: L 4+/5, R 3+/5  Seated IR/ER on L: 4/5, R 3+/5 (from 8-5) L DF and Eversion 4+/5 with first 1-3 reps and fatigue decreased to 4/4. (9/9) Pt. Demonstrates ~ 4+/5 B with increased spasticity B.   Balance/stability:  (6-29) Pt. Able to achieve good posture without cueing but only able to maintain for ~ 2.5 min. Before seeking stability from the table and forward rolled shoulders.  With cue to engage the deep core, Pt. Recognizes increased stability and comfort.  8/5: Pt. Demonstrating decreased stability in gait and standing balance today with fasciculations increasing in direct relation to muscular endurance required. Able to stand ~ 10 sec. In SLS with MIN UE support before intensity of spasms make attempt at balance futile.   12/30: Pt. Reports feeling less stable on LLE again, similar to August but less intense.   1/31: Able to stand for ~ 10 sec. In double leg stance and on RLE with no LOB the first attempt but required TC and VC for L glute med. Activation for LLE. Second attempt less cueing recuired for LLE but far more fasiculations noted. Was able to stand with slow perterbations in multiple directions applied to the shoulders for ~ 20 sec. Before fatigue slowed reaction time.  2/17: performed circle maze and square maze on balance machine with BUE support. Circle: 134 sec. -  4 pts. Square: 137 sec. And -17 pts.   Pelvic Floor External Exam: (From prior session)- [Introitus Appears: elevated Skin integrity: WNL Palpation: TTP to B STP Cough: limited motion Prolapse visible?: no Scar mobility: not assessed  Internal Vaginal Exam: Strength (PERF): Pt. Has difficulty sensing the PFM to generate contraction or relaxation due to high tone/neuro involvement Symmetry: L>R for spasm/TTP Palpation: TTP to all muscles throughout B Prolapse: none]  unable to coordinate squeeze and TTP to all areas with the greatest at B OI and coccygeus. ** following TP release Pt. Able to achieve 1/5 PFM contraction. Decreased TTP at B anterior PR/PC following treatment. (10/14).  TODAY: not re-assessed today.  Abdominal:  Pt. Demonstrated ability to recruit R>L obliques and TA with pelvic tilts. (from prior session)  Palpation: Decreased fascial and scar mobility through lower abdomen with radiating Sx. To the pelvis and LB. (from prior session)  12/30: TTP to L QL and  Iliacus  Murphy's punch sign: Some sharp pain that shot forward with percussion to B kidneys. (from 06/03/20)   07/01/20: TTP to B erector spinae and multifidus from L3-5  TODAY: TTP to R adductor brevis and L iliacus and psoas.   Gait Analysis:  (04/22/20) 2 min. Walk test: 12 meters on 1st attempt. 7.75 meters on 2nd attempt. (video)  Pt. Demonstrated crouched walk with difficulty controlling where she placed the R>L LE and ~ 3-4 near LOB while walking on 2nd attempt with PT at [facility] and using Rollator  (Pt. Feels like this would have been better 2-3 days ago even)  2/3: Pt. Walked ~ 50 ft. x2 With rollator and gait belt. Attempted to walk to metronome cadence but with difficulty, ended up keeping to ~ 1/2 time of the tempo with ~ 50% consistency. Some difficulty with control noted but less than during initial walk test.  INTERVENTIONS THIS SESSION: Manual: Performed TP release and STM to R adductor brevis and L iliacus and psoas to  decrease spasm and pain and allow for improved balance of musculature for improved function and decreased symptoms.  Therex: Reviewed new exercise prescribed by Dr. Dario Ave for hip pain/instability on R.  Total time: 60 min.                       Trigger Point Dry Needling - 07/08/20 0001    Consent Given? Yes    Education Handout Provided No    Muscles Treated Lower Quadrant Adductor longus/brevis/magnus    Muscles Treated Back/Hip Iliacus;Iliopsoas    Dry Needling Comments L adductor Brevis, R hip-flexors    Adductor Response Twitch response elicited;Palpable increased muscle length    Iliacus Response Twitch response elicited;Palpable increased muscle length    Iliopsoas Response Twitch response elicited;Palpable increased muscle length                  PT Short Term Goals - 06/28/20 1233      PT SHORT TERM GOAL #1   Title Patient will demonstrate coordinated diaphragmatic breathing with pelvic tilts to demonstrate  improved control of diaphragm and TA, to allow for further strengthening of core musculature and decreased pelvic floor spasm.    Baseline Pt. demonstrates breathing dysfunction and poor PFM coordination evidenced by anal manometry As of 2/22: Pt. continues to have restriction in her diaphragm and near T/L junction but is able to intentionally use diaphragmatic breathing through the available ROM.    Time 5    Period Weeks  Status Achieved    Target Date 04/20/19      PT SHORT TERM GOAL #2   Title Patient will report a reduction in pain to no greater than 6/10 over the prior week to demonstrate symptom improvement.    Baseline Pain is 10/10 at worst, 2/10 at best As of 2/22: Pt. had achieved this goal for 1 week as of 1/21 but may have infection or other insult that caused pain to increase again. As of 4/29: Pain high of 8/10 but has been over-doing her activity over the past week. As of 6/10: Pt. was able to keep pain at 6 or below since prior visit by using TENS to help keep tension lower but has not had a full week to test if it will keep working. As of 7/6: Pt. had met this goal but has a new UTI and it caused pain to spike to 8/10 over the weekend. As of 11/9: Pt. has just finally made progress with long-standing UTI and new POC for treatment so she is still having pain spikes up to 8/10 but we expect greater improvement over the next few months. As of 12/30: still feels like she is trending in a better direction but over-did it over the holidays and pain spiked to 8/10. No higher than 6/10 over last 2 days, even with PMS cramping. 3/14: 8/10 pain from bladder spasm    Time 5    Period Weeks    Status On-going    Target Date 08/02/20      PT SHORT TERM GOAL #3   Title Patient will demonstrate HEP x1 in the clinic to demonstrate understanding and proper form to allow for further improvement.    Baseline Pt. lacks knowledge of therepeutic exercises that can decrease her pain/Sx.    Time 5     Period Weeks    Status Achieved    Target Date 04/20/19      PT SHORT TERM GOAL #4   Title Patient will report consistent use of foot-stool (squatty-potty) for positioning with BM to decrease pain with BM and intra-abdominal pressure.    Baseline Pt. having constipation due to PFM dysfunction    Time 5    Period Weeks    Status Achieved    Target Date 04/20/19             PT Long Term Goals - 06/28/20 0001      PT LONG TERM GOAL #1   Title Pt. will be able to participate in regular ADL's with least restrictive device without pain increasing greater than 2/10    Baseline Pt. limited in her ability to perform household duties by increased pain and fatigue. As of 4/29: Her legs have more endurance with walking before she gets a tremor. Still needs to sit down the majority of the time she is cooking. Still needs back support for prolonged sitting. As of 6/10: Pt. able to do for  ~1 hour before she needs to rest to prevent increased pain, pain is averaging ~ 4-5/10 3/14: able to perform ADLs with less pain, does still have bad days    Time 12    Period Weeks    Status On-going    Target Date 09/20/20      PT LONG TERM GOAL #2   Title Patient will score at or below 65/300  on the PFDI and 35% on the Female NIH-CPSI to demonstrate a clinically meaningful decrease in disability and distress due to pelvic floor  dysfunction.    Baseline PFDI: 110/300, Female NIH-CPSI: 29/43 (67%) As of 4/29: 100/300 on PFDI and 30/43 on NIH-CPSI As of 7/6:  NIH-CPSI: 32/43 POPDI: 61/100, CRADI-8: 29/100, UDI: 44/100 (PFDI: 149/300), As of 04/15/20: Female NIH-CPSI: 29/43, PFDI: 84/300; 3/14: cmomposite 126/300    Time 12    Period Weeks    Status On-going    Target Date 09/20/20      PT LONG TERM GOAL #3   Title Patient will report no pain with intercourse to demonstrate improved functional ability.    Baseline Pt. Having significant pain with intercourse. As of 4/29: is a little better (20-30%) but is  worse on days where overall tension/pain is worse. As of 6/10: can have days where there is not much pain but other days where there is significant pain. As of 11/9: same as previous update. AS of 04/15/20: 25% improved from base-line 3/14: to be asssessed next session by pelvic therapist    Time 12    Period Weeks    Status Partially Met    Target Date 09/20/20      PT LONG TERM GOAL #5   Title Pt. will be able to go 2-3 hours between emptying her bladder without increased pain and empty her bladder fully whether by urostomy, cath, or voluntary release.    Baseline Pt. has a urostomy but continues to have to self-catheterize. Has pain with bladder filling and when using catheter. As of 4/29: is doing better since starting to take the pregabalin andd baclofen but occasionally still has pain with bladder filling. It is bad when she has an infection or over-does activity, better on "normal" days which have been few and far between due to frequent UTI's. As of 6/10: Pt. has "good" and "bad" days but has more days where she can go 2-3 hours without increased pain. As of 04/15/20: able to wait 2 hours, not 3 yet 3/14: somedays able to meet this goals, some days is more frequent    Time 12    Period Weeks    Status Partially Met    Target Date 09/20/20      PT LONG TERM GOAL #6   Title Patient will report having BM's at least every-other day with consistency between [facility] stool scale 3-5 over the prior week to demonstrate decreased constipation.    Baseline Pt. unable to have regula BM's without medication, manometry shows PFM dysfunction. As of 4/29: still having to use and enema or supository occasionally and still needing linzess. As of 6/10: Pt. has days where she can empty ok and days where she needs to use an Enema to stimulate a BM, feels that it is just the insertion of the enema, not the solution that does the job. As of 11/9: Pt. not having constipation due to colonic infection that has shifted  her toward diarrhea. Having BM's daily, sometimes type 5. Has had diarrhea more than constipation, still had to take linzess 1 time to go after 2 days without a BM. 3/14: to be assessed by pelvic therapist    Time 12    Period Weeks    Status On-going    Target Date 09/20/20      PT LONG TERM GOAL #7   Title Pt. will drive to a meaningful activity to demonstrate increased function and participation    Baseline Pt. has not been able to drive due to decreased LE control 3/134: still not driving at this time    Time  12    Period Weeks    Status On-going    Target Date 09/20/20      PT LONG TERM GOAL #8   Title Pt. will be able to perform 1 hr. of light housework reqiring intermittent standing and walking without increased pain/fatigue >2 pts.    Baseline Pt. becomes fatigued washing fruits/vegetbles in small stages and has fatigue>3 pts. 3/14: able to perform but needs rest breaks    Time 12    Period Weeks    Status Partially Met    Target Date 09/20/20      PT LONG TERM GOAL  #9   TITLE Pt. will be able to attend a full athletic event for her children without Sx increasing by more than 2 pts.    Baseline Pt. having migraine or severe bladder spasms and fatigue and back pain> 3 pt. increase with attendance at athletic events 3/14: has increased pain but is able to attend now, has to use TENS unit    Time 12    Period Weeks    Status Partially Met    Target Date 09/20/20                 Plan - 07/08/20 1511    Clinical Impression Statement Pt. Responded well to all interventions today, demonstrating improved hip ROM with decreased pain and spasm, as well as understanding and correct performance of all education and exercises provided today. They will continue to benefit from skilled physical therapy to work toward remaining goals and maximize function as well as decrease likelihood of symptom increase or recurrence.    PT Next Visit Plan use balance game for 5 min., ask about  stationary bike success/fatigue levels. Ask about taping/re-apply, further suprapubic scar work, deep-core, scapular tracking/strength, and balance, MFR to lower abdomen, Review pain diary, cupping to medial thighs and upper back, re-assess neural glides on LLE, focus on graded activity, and seated and standing balance/core strength. review I's Y's and T's in prone, thoracic mobility? perform further release and PNF for R>L shoulder blades, MFR around B ilium. continue to work on thoracolumbar rotation ROM and decreasing spasms    PT Home Exercise Plan A day: low back stretch, side-stretch, hamstring stretch, butterfly stretch, hip EXT in prone with knee bent, standing hip ABD/EXT, calf raises. B day: Chest-stretch, Thoracic extensions over a towel roll, seated rows and seated scapular retraction (pull shoulder blades together like "T's") with band, child's pose stretch, self internal TP release (especially when Pelvic floor spasms high), Every day: Pain diary, Posterior pelvic tilts with deep core activation and red band for obliques activation, bow-and-arrow, TENS unit 3+ times per day for 30 min and as needed if pain starts to increase. TENS at ASIS/PSIS at strong tingle for 1 hr. per day. Sleeper stretch PRN, vagal nerve toning    Consulted and Agree with Plan of Care Patient           Patient will benefit from skilled therapeutic intervention in order to improve the following deficits and impairments:     Visit Diagnosis: Other muscle spasm  Muscle weakness (generalized)  Difficulty in walking, not elsewhere classified     Problem List Patient Active Problem List   Diagnosis Date Noted  . Seizure ([place]) 11/11/2018  . Major depressive disorder, recurrent episode, moderate ([place]) 02/07/2018  . Nephrolithiasis 04/16/2016  . Numbness 07/28/2015  . Bladder retention 06/23/2015  . Abdominal pain 06/04/2015  . Dizziness 05/18/2015  . Neck pain 05/18/2015  .  Complicated migraine 16/01/9603   . Other fatigue 04/28/2015  . Abnormal finding on MRI of brain 04/28/2015  . D (diarrhea) 03/29/2015  . H/O disease 03/29/2015  . Abnormal weight loss 03/29/2015  . Muscle weakness (generalized) 03/14/2015  . Headache, migraine 03/10/2015   Willa Rough DPT, ATC Willa Rough 07/08/2020, 3:12 PM  [place] [facility] SERVICES [address] [place], [place], [address] Phone: [phone]   Fax:  [phone]  Name: Christine Chavez MRN: [id number] Date of Birth: 1981/03/26

## 2020-07-12 ENCOUNTER — Ambulatory Visit: Payer: BC Managed Care – PPO

## 2020-07-12 ENCOUNTER — Other Ambulatory Visit: Payer: Self-pay

## 2020-07-12 DIAGNOSIS — M62838 Other muscle spasm: Secondary | ICD-10-CM | POA: Diagnosis not present

## 2020-07-12 DIAGNOSIS — R262 Difficulty in walking, not elsewhere classified: Secondary | ICD-10-CM

## 2020-07-12 DIAGNOSIS — M6281 Muscle weakness (generalized): Secondary | ICD-10-CM

## 2020-07-12 NOTE — Therapy (Signed)
[facility] [address]  [place], [place], [address] Phone: [phone]   Fax:  [phone]  Physical Therapy Treatment  Patient Details  Name: Christine Chavez MRN: [id number] Date of Birth: 05/08/1980 No data recorded  Encounter Date: 07/12/2020   PT End of Session - 07/12/20 1026    Visit Number 94    Number of Visits 114    Date for PT Re-Evaluation 09/20/20    Authorization Type [employer]; 4 session balance PT    Authorization Time Period 06/28/20-09/20/20    Authorization - Visit Number 13    Authorization - Number of Visits 20    Progress Note Due on Visit 98    PT Start Time 1031    PT Stop Time 1114    PT Time Calculation (min) 43 min    Activity Tolerance Patient tolerated treatment well;No increased pain    Behavior During Therapy WFL for tasks assessed/performed           Past Medical History:  Diagnosis Date  . Complication of anesthesia    ? seizures after anesthesia   . Headache   . Migraines   . Neurogenic bladder   . Renal disorder   . Vision abnormalities     Past Surgical History:  Procedure Laterality Date  . ANTERIOR CRUCIATE LIGAMENT REPAIR  1997  . APPENDECTOMY    . COLONOSCOPY WITH PROPOFOL N/A 11/11/2018   Procedure: COLONOSCOPY WITH PROPOFOL;  Surgeon: Skulskie, Martin U, MD;  Location: [facility];  Service: Endoscopy;  Laterality: N/A;  . CYSTOSCOPY WITH STENT PLACEMENT Right 04/17/2016   Procedure: CYSTOSCOPY WITH STENT PLACEMENT;  Surgeon: Patrick L McKenzie, MD;  Location: [facility] OR[facility];  Service: Urology;  Laterality: Right;  . ESOPHAGOGASTRODUODENOSCOPY (EGD) WITH PROPOFOL N/A 11/11/2018   Procedure: ESOPHAGOGASTRODUODENOSCOPY (EGD) WITH PROPOFOL;  Surgeon: Skulskie, Martin U, MD;  Location: [facility] ENDOS[facility];  Service: Endoscopy;  Laterality: N/A;  . EXPLORATORY LAPAROTOMY  1999  . KIDNEY STONE SURGERY  04/2016  . REVISION UROSTOMY CUTANEOUS    . REVISION UROSTOMY CUTANEOUS  01/10/2018   . SUPRAPUBIC CATHETER PLACEMENT  08/2017  . TONSILLECTOMY      There were no vitals filed for this visit.   Subjective Assessment - 07/12/20 1032    Subjective Patient reports compliance with HEP. Has been feeling really sick/migraines since last session. Feeling weaker, will contact doctor about changes.    Limitations Lifting;Standing;Walking;House hold activities;Other (comment)    Patient Stated Goals to regain PLOF    Currently in Pain? Yes    Pain Score 4     Pain Location Back    Pain Orientation Lower    Pain Descriptors / Indicators Aching    Pain Type Chronic pain    Pain Onset More than a month ago    Pain Frequency Constant               supine with wedge behind back: heat pad applied to abdomen due to menstrual cramping:  -RTB marches with slight abduction to reduce pain in hip. 15x each LE  -single knee abduction, stabilization to opposite LE from PT, 15x each LE. Patient limited in full range of RLE due to feeling of "pop".  -green ball between knees; bridge with cues for gluteal squeeze and muscle activation. 10x   -RTB resisted hamstring curl with foot on slider 15x very challenging for patient  Bolster under legs:  -2lb ankle weight: 15x each LE   Seated:  Bosu   ball df/pf 15x with focus on full range of motion and control.  Ambulate with rollator: close CGA 30 ft, excessive knee hyperextension of L knee resulting in near LOB. Ambulation terminated.      Patient performed with instruction, verbal cues, tactile cues of therapist: goal: increase tissue extensibility, promote proper posture, improve mobility                PT Education - 07/12/20 1024    Education Details exercise technique, body mechanics    Person(s) Educated Patient    Methods Explanation;Demonstration;Tactile cues;Verbal cues    Comprehension Verbalized understanding;Returned demonstration;Verbal cues required;Tactile cues required            PT Short Term Goals -  06/28/20 1233      PT SHORT TERM GOAL #1   Title Patient will demonstrate coordinated diaphragmatic breathing with pelvic tilts to demonstrate improved control of diaphragm and TA, to allow for further strengthening of core musculature and decreased pelvic floor spasm.    Baseline Pt. demonstrates breathing dysfunction and poor PFM coordination evidenced by anal manometry As of 2/22: Pt. continues to have restriction in her diaphragm and near T/L junction but is able to intentionally use diaphragmatic breathing through the available ROM.    Time 5    Period Weeks    Status Achieved    Target Date 04/20/19      PT SHORT TERM GOAL #2   Title Patient will report a reduction in pain to no greater than 6/10 over the prior week to demonstrate symptom improvement.    Baseline Pain is 10/10 at worst, 2/10 at best As of 2/22: Pt. had achieved this goal for 1 week as of 1/21 but may have infection or other insult that caused pain to increase again. As of 4/29: Pain high of 8/10 but has been over-doing her activity over the past week. As of 6/10: Pt. was able to keep pain at 6 or below since prior visit by using TENS to help keep tension lower but has not had a full week to test if it will keep working. As of 7/6: Pt. had met this goal but has a new UTI and it caused pain to spike to 8/10 over the weekend. As of 11/9: Pt. has just finally made progress with long-standing UTI and new POC for treatment so she is still having pain spikes up to 8/10 but we expect greater improvement over the next few months. As of 12/30: still feels like she is trending in a better direction but over-did it over the holidays and pain spiked to 8/10. No higher than 6/10 over last 2 days, even with PMS cramping. 3/14: 8/10 pain from bladder spasm    Time 5    Period Weeks    Status On-going    Target Date 08/02/20      PT SHORT TERM GOAL #3   Title Patient will demonstrate HEP x1 in the clinic to demonstrate understanding and  proper form to allow for further improvement.    Baseline Pt. lacks knowledge of therepeutic exercises that can decrease her pain/Sx.    Time 5    Period Weeks    Status Achieved    Target Date 04/20/19      PT SHORT TERM GOAL #4   Title Patient will report consistent use of foot-stool (squatty-potty) for positioning with BM to decrease pain with BM and intra-abdominal pressure.    Baseline Pt. having constipation due to PFM dysfunction  Time 5    Period Weeks    Status Achieved    Target Date 04/20/19             PT Long Term Goals - 06/28/20 0001      PT LONG TERM GOAL #1   Title Pt. will be able to participate in regular ADL's with least restrictive device without pain increasing greater than 2/10    Baseline Pt. limited in her ability to perform household duties by increased pain and fatigue. As of 4/29: Her legs have more endurance with walking before she gets a tremor. Still needs to sit down the majority of the time she is cooking. Still needs back support for prolonged sitting. As of 6/10: Pt. able to do for  ~1 hour before she needs to rest to prevent increased pain, pain is averaging ~ 4-5/10 3/14: able to perform ADLs with less pain, does still have bad days    Time 12    Period Weeks    Status On-going    Target Date 09/20/20      PT LONG TERM GOAL #2   Title Patient will score at or below 65/300  on the PFDI and 35% on the Female NIH-CPSI to demonstrate a clinically meaningful decrease in disability and distress due to pelvic floor dysfunction.    Baseline PFDI: 110/300, Female NIH-CPSI: 29/43 (67%) As of 4/29: 100/300 on PFDI and 30/43 on NIH-CPSI As of 7/6:  NIH-CPSI: 32/43 POPDI: 61/100, CRADI-8: 29/100, UDI: 44/100 (PFDI: 149/300), As of 04/15/20: Female NIH-CPSI: 29/43, PFDI: 84/300; 3/14: cmomposite 126/300    Time 12    Period Weeks    Status On-going    Target Date 09/20/20      PT LONG TERM GOAL #3   Title Patient will report no pain with intercourse to  demonstrate improved functional ability.    Baseline Pt. Having significant pain with intercourse. As of 4/29: is a little better (20-30%) but is worse on days where overall tension/pain is worse. As of 6/10: can have days where there is not much pain but other days where there is significant pain. As of 11/9: same as previous update. AS of 04/15/20: 25% improved from base-line 3/14: to be asssessed next session by pelvic therapist    Time 12    Period Weeks    Status Partially Met    Target Date 09/20/20      PT LONG TERM GOAL #5   Title Pt. will be able to go 2-3 hours between emptying her bladder without increased pain and empty her bladder fully whether by urostomy, cath, or voluntary release.    Baseline Pt. has a urostomy but continues to have to self-catheterize. Has pain with bladder filling and when using catheter. As of 4/29: is doing better since starting to take the pregabalin andd baclofen but occasionally still has pain with bladder filling. It is bad when she has an infection or over-does activity, better on "normal" days which have been few and far between due to frequent UTI's. As of 6/10: Pt. has "good" and "bad" days but has more days where she can go 2-3 hours without increased pain. As of 04/15/20: able to wait 2 hours, not 3 yet 3/14: somedays able to meet this goals, some days is more frequent    Time 12    Period Weeks    Status Partially Met    Target Date 09/20/20      PT LONG TERM GOAL #6     Title Patient will report having BM's at least every-other day with consistency between Bristol stool scale 3-5 over the prior week to demonstrate decreased constipation.    Baseline Pt. unable to have regula BM's without medication, manometry shows PFM dysfunction. As of 4/29: still having to use and enema or supository occasionally and still needing linzess. As of 6/10: Pt. has days where she can empty ok and days where she needs to use an Enema to stimulate a BM, feels that it is  just the insertion of the enema, not the solution that does the job. As of 11/9: Pt. not having constipation due to colonic infection that has shifted her toward diarrhea. Having BM's daily, sometimes type 5. Has had diarrhea more than constipation, still had to take linzess 1 time to go after 2 days without a BM. 3/14: to be assessed by pelvic therapist    Time 12    Period Weeks    Status On-going    Target Date 09/20/20      PT LONG TERM GOAL #7   Title Pt. will drive to a meaningful activity to demonstrate increased function and participation    Baseline Pt. has not been able to drive due to decreased LE control 3/134: still not driving at this time    Time 12    Period Weeks    Status On-going    Target Date 09/20/20      PT LONG TERM GOAL #8   Title Pt. will be able to perform 1 hr. of light housework reqiring intermittent standing and walking without increased pain/fatigue >2 pts.    Baseline Pt. becomes fatigued washing fruits/vegetbles in small stages and has fatigue>3 pts. 3/14: able to perform but needs rest breaks    Time 12    Period Weeks    Status Partially Met    Target Date 09/20/20      PT LONG TERM GOAL  #9   TITLE Pt. will be able to attend a full athletic event for her children without Sx increasing by more than 2 pts.    Baseline Pt. having migraine or severe bladder spasms and fatigue and back pain> 3 pt. increase with attendance at athletic events 3/14: has increased pain but is able to attend now, has to use TENS unit    Time 12    Period Weeks    Status Partially Met    Target Date 09/20/20                 Plan - 07/12/20 1047    Clinical Impression Statement Patient's session limited by hip pain and "popping". Education on strengthening and performance of nonweightbearing strengthening interventions tolerated well with no increase in pain. . Patient remains highly motivated despite new onset of symptoms. Patient will benefit from skilled physical  therapy to increase strength, stability, and decrease fall risk for improved quality of life    Personal Factors and Comorbidities Comorbidity 3+    Comorbidities neurologic insult with pending diagnosis, multiple recurrent UTI's, BLE weakness, depression    Examination-Activity Limitations Caring for Others;Carry;Continence;Squat;Stairs;Toileting;Locomotion Level;Lift;Stand    Examination-Participation Restrictions Church;Cleaning;Community Activity;Driving;Interpersonal Relationship;Laundry;Yard Work;Meal Prep    Stability/Clinical Decision Making Unstable/Unpredictable    Rehab Potential Fair    PT Frequency 2x / week    PT Duration 12 weeks    PT Treatment/Interventions ADLs/Self Care Home Management;Aquatic Therapy;Biofeedback;Moist Heat;Electrical Stimulation;Cryotherapy;Traction;Ultrasound;Therapeutic activities;Functional mobility training;Stair training;Gait training;Therapeutic exercise;Balance training;Neuromuscular re-education;Patient/family education;Manual techniques;Dry needling;Scar mobilization;Energy conservation;Taping;Joint Manipulations;Spinal Manipulations;Visual/perceptual remediation/compensation;Passive range of motion      PT Next Visit Plan use balance game for 5 min., ask about stationary bike success/fatigue levels. Ask about taping/re-apply, further suprapubic scar work, deep-core, scapular tracking/strength, and balance, MFR to lower abdomen, Review pain diary, cupping to medial thighs and upper back, re-assess neural glides on LLE, focus on graded activity, and seated and standing balance/core strength. review I's Y's and T's in prone, thoracic mobility? perform further release and PNF for R>L shoulder blades, MFR around B ilium. continue to work on thoracolumbar rotation ROM and decreasing spasms    PT Home Exercise Plan A day: low back stretch, side-stretch, hamstring stretch, butterfly stretch, hip EXT in prone with knee bent, standing hip ABD/EXT, calf raises. B day:  Chest-stretch, Thoracic extensions over a towel roll, seated rows and seated scapular retraction (pull shoulder blades together like "T's") with band, child's pose stretch, self internal TP release (especially when Pelvic floor spasms high), Every day: Pain diary, Posterior pelvic tilts with deep core activation and red band for obliques activation, bow-and-arrow, TENS unit 3+ times per day for 30 min and as needed if pain starts to increase. TENS at ASIS/PSIS at strong tingle for 1 hr. per day. Sleeper stretch PRN, vagal nerve toning    Consulted and Agree with Plan of Care Patient           Patient will benefit from skilled therapeutic intervention in order to improve the following deficits and impairments:  Abnormal gait,Decreased balance,Decreased endurance,Decreased mobility,Difficulty walking,Increased muscle spasms,Impaired sensation,Improper body mechanics,Impaired tone,Decreased activity tolerance,Decreased coordination,Decreased strength,Postural dysfunction,Pain  Visit Diagnosis: Other muscle spasm  Muscle weakness (generalized)  Difficulty in walking, not elsewhere classified     Problem List Patient Active Problem List   Diagnosis Date Noted  . Seizure ([place]) 11/11/2018  . Major depressive disorder, recurrent episode, moderate ([place]) 02/07/2018  . Nephrolithiasis 04/16/2016  . Numbness 07/28/2015  . Bladder retention 06/23/2015  . Abdominal pain 06/04/2015  . Dizziness 05/18/2015  . Neck pain 05/18/2015  . Complicated migraine 50/35/4656  . Other fatigue 04/28/2015  . Abnormal finding on MRI of brain 04/28/2015  . D (diarrhea) 03/29/2015  . H/O disease 03/29/2015  . Abnormal weight loss 03/29/2015  . Muscle weakness (generalized) 03/14/2015  . Headache, migraine 03/10/2015   Janna Arch, PT, DPT   07/12/2020, 11:28 AM  [place] [facility] [address] [place], [place], [address] Phone: [phone]   Fax:   [phone]  Name: Christine Chavez MRN: [id number] Date of Birth: [birth year]

## 2020-07-15 ENCOUNTER — Ambulatory Visit: Payer: BC Managed Care – PPO

## 2020-07-19 ENCOUNTER — Ambulatory Visit: Payer: BC Managed Care – PPO | Attending: Gastroenterology

## 2020-07-19 ENCOUNTER — Other Ambulatory Visit: Payer: Self-pay

## 2020-07-19 DIAGNOSIS — M6281 Muscle weakness (generalized): Secondary | ICD-10-CM | POA: Diagnosis present

## 2020-07-19 DIAGNOSIS — R262 Difficulty in walking, not elsewhere classified: Secondary | ICD-10-CM

## 2020-07-19 DIAGNOSIS — M62838 Other muscle spasm: Secondary | ICD-10-CM | POA: Insufficient documentation

## 2020-07-19 NOTE — Therapy (Signed)
[place] [facility]IN [facility] SERVICES [address] [place], [place], [address] Phone: [phone]   Fax:  [phone]  Physical Therapy Treatment  The patient has been informed of current processes in place at Outpatient Rehab to protect patients from Covid-19 exposure including social distancing, schedule modifications, and new cleaning procedures. After discussing their particular risk with a therapist based on the patient's personal risk factors, the patient has decided to proceed with in-person therapy.  Patient Details  Name: Christine Chavez MRN: [id number] Date of Birth: 12-Jun-1980 No data recorded  Encounter Date: 07/19/2020   PT End of Session - 07/19/20 1409    Visit Number 95    Number of Visits 114    Date for PT Re-Evaluation 09/20/20    Authorization Type [employer]; 4 session balance PT    Authorization Time Period 06/28/20-09/20/20    Authorization - Visit Number 14    Authorization - Number of Visits 20    Progress Note Due on Visit 98    PT Start Time 8416    PT Stop Time 1135    PT Time Calculation (min) 60 min    Activity Tolerance Patient tolerated treatment well;No increased pain    Behavior During Therapy WFL for tasks assessed/performed           Past Medical History:  Diagnosis Date  . Complication of anesthesia    ? seizures after anesthesia   . Headache   . Migraines   . Neurogenic bladder   . Renal disorder   . Vision abnormalities     Past Surgical History:  Procedure Laterality Date  . ANTERIOR CRUCIATE LIGAMENT REPAIR  1997  . APPENDECTOMY    . COLONOSCOPY WITH PROPOFOL N/A 11/11/2018   Procedure: COLONOSCOPY WITH PROPOFOL;  Surgeon: [name], MD;  Location: [facility] ENDOSCOPY;  Service: Endoscopy;  Laterality: N/A;  . CYSTOSCOPY WITH STENT PLACEMENT Right 04/17/2016   Procedure: CYSTOSCOPY WITH STENT PLACEMENT;  Surgeon: [name], MD;  Location: [facility] ORS;  Service: Urology;  Laterality: Right;  .  ESOPHAGOGASTRODUODENOSCOPY (EGD) WITH PROPOFOL N/A 11/11/2018   Procedure: ESOPHAGOGASTRODUODENOSCOPY (EGD) WITH PROPOFOL;  Surgeon: [name], MD;  Location: [facility] ENDOSCOPY;  Service: Endoscopy;  Laterality: N/A;  . EXPLORATORY LAPAROTOMY  1999  . KIDNEY STONE SURGERY  04/2016  . REVISION UROSTOMY CUTANEOUS    . REVISION UROSTOMY CUTANEOUS  01/10/2018  . SUPRAPUBIC CATHETER PLACEMENT  08/2017  . TONSILLECTOMY      There were no vitals filed for this visit.  Pelvic Floor Physical Therapy Treatment Note  SCREENING  Changes in medications, allergies, or medical history?:   SUBJECTIVE  Patient reports: She is doing alright today but she over-did it throwing a birthday party for her husband over the weekend and then has had elevated spasms and fatigue, felt terrible yesterday and is still having the shooting pain when she finishes urinating and having foul smelling urine. Feels like she probably has a UTI but has been in denial. They are waiting to see if insurance is going to cover the injections for migraines but has still been having them in the mean time.   Precautions:  Neurological diagnosis pending, has "non-specific" brain lesions   Pain update:  Location of pain: LB and neck/shoulders (bladder) Current pain:  5/10) (5/10) Max pain: 8/10 (8/10 when it spasms) Least pain:  3/10 (3/10) Nature of pain: sharp to dull ache (grabbing)  ** "feels a little better" following treatment.  Patient Goals:  Get rid of the discomfort with catheterizing and ache/bladder pain. Be able to take fewer medications and be able to enjoy spending time with her family again, doing "all the mom things"    OBJECTIVE  Changes in:  Observations/Posture:  L lumbar curve prominent with transverse processes more prominent on the L. (from previous note)  C3 spinous process deviated L through C2-3. (from previous note)  12/30: L up-slip and anterior rotation **following treatment L iliacus  remained tight, not allowing for complete up-slip or rotation correction.  07/01/20: PSIS appear level in prone, L5 spinous process prominent.   Range of Motion/Flexibilty:  (from 03/27/19) Spine: ~ 2-3 fingers from knee with SB B. ~ 50% reduced R rotation with stretch in L, ~ 75% reduced L rotation with increased tightness. HS: able to achieve 90 deg. Hip flexion in seated HS stretch. (from prior treatment)  Decreased PIVM through Thoracic, lumbar, and sacral spine with greatest restriction at T3-6. (from prior session)  12/30:  no decreased mobility at the sacrum with attempt at manual correction for L anterior rotation, hip-flexor spasm keeping Pt. From approp  07/01/20: Decreased scar mobility in the suprapubic region.   07/08/20: Pt. Demonstrated decreased ROM in R hip IR/ER compared to L with empty end-feel. **following treatment Pt. Able to demonstrate normal ROM without pain for IR/ER and no pain with aggravating motion of getting in/out of w/c or car.  TODAY: decreased scar mobility in supra-pubic region in cephalic Directon. Decreased L to R cervical PIVM mobility.  Strength/MMT:  LE MMT (from 03-27-19) LE MMT Left Right  Hip flex:  (L2) /5 /5  Hip ext: 3+/5 3+/5  Hip abd: 4/5 4/5  Hip add: 4/5 3+/5  Hip IR 4/5 3+/5  Hip ER 4/5 4/5   (From 3/24): Pt. Demonstrates 3/5 strength with "I's, Y's, and T's" but 4/5 with W's. (from 6/22) DF: 9 reps with B. Eversion/Inversion: 4/5 on R, 4+/5 on L. DF: L 4+/5, R 3+/5  Seated IR/ER on L: 4/5, R 3+/5 (from 8-5) L DF and Eversion 4+/5 with first 1-3 reps and fatigue decreased to 4/4. (9/9) Pt. Demonstrates ~ 4+/5 B with increased spasticity B.   Balance/stability:  (6-29) Pt. Able to achieve good posture without cueing but only able to maintain for ~ 2.5 min. Before seeking stability from the table and forward rolled shoulders. With cue to engage the deep core, Pt. Recognizes increased stability and comfort.  8/5: Pt. Demonstrating  decreased stability in gait and standing balance today with fasciculations increasing in direct relation to muscular endurance required. Able to stand ~ 10 sec. In SLS with MIN UE support before intensity of spasms make attempt at balance futile.   12/30: Pt. Reports feeling less stable on LLE again, similar to August but less intense.   1/31: Able to stand for ~ 10 sec. In double leg stance and on RLE with no LOB the first attempt but required TC and VC for L glute med. Activation for LLE. Second attempt less cueing recuired for LLE but far more fasiculations noted. Was able to stand with slow perterbations in multiple directions applied to the shoulders for ~ 20 sec. Before fatigue slowed reaction time.  2/17: performed circle maze and square maze on balance machine with BUE support. Circle: 134 sec. -4 pts. Square: 137 sec. And -17 pts.   Pelvic Floor External Exam: (From prior session)- [Introitus Appears: elevated Skin integrity: WNL Palpation: TTP to B STP Cough: limited motion Prolapse visible?: no  Scar mobility: not assessed  Internal Vaginal Exam: Strength (PERF): Pt. Has difficulty sensing the PFM to generate contraction or relaxation due to high tone/neuro involvement Symmetry: L>R for spasm/TTP Palpation: TTP to all muscles throughout B Prolapse: none]  unable to coordinate squeeze and TTP to all areas with the greatest at B OI and coccygeus. ** following TP release Pt. Able to achieve 1/5 PFM contraction. Decreased TTP at B anterior PR/PC following treatment. (10/14).  TODAY: not re-assessed today.  Abdominal:  Pt. Demonstrated ability to recruit R>L obliques and TA with pelvic tilts. (from prior session)  Palpation: Decreased fascial and scar mobility through lower abdomen with radiating Sx. To the pelvis and LB. (from prior session)  12/30: TTP to L QL and Iliacus  Murphy's punch sign: Some sharp pain that shot forward with percussion to B kidneys. (from  06/03/20)   07/01/20: TTP to B erector spinae and multifidus from L3-5  07/08/20: TTP to R adductor brevis and L iliacus and psoas.   TODAY: TTP to L cervical extensors, B sub-occipitals   Gait Analysis:  (04/22/20) 2 min. Walk test: 12 meters on 1st attempt. 7.75 meters on 2nd attempt. (video)  Pt. Demonstrated crouched walk with difficulty controlling where she placed the R>L LE and ~ 3-4 near LOB while walking on 2nd attempt with PT at [facility] and using Rollator  (Pt. Feels like this would have been better 2-3 days ago even)  2/3: Pt. Walked ~ 50 ft. x2 With rollator and gait belt. Attempted to walk to metronome cadence but with difficulty, ended up keeping to ~ 1/2 time of the tempo with ~ 50% consistency. Some difficulty with control noted but less than during initial walk test.  INTERVENTIONS THIS SESSION: Manual: Performed TP release and STM to L cervical extensors, B sub-occipitals followed by grade 3-4 L to R lateral mobs at C3 and then MFR using a static manual pull in a cephalic direction from the most restricted point oh her suprapubic scar to decrease spasm and pain and allow for improved balance of musculature for improved function and decreased symptoms. Pt. Used MHP for LB and neck when able to decrease tension and allow for maximal tissue mobility.  Total time: 60 min.                             PT Short Term Goals - 06/28/20 1233      PT SHORT TERM GOAL #1   Title Patient will demonstrate coordinated diaphragmatic breathing with pelvic tilts to demonstrate improved control of diaphragm and TA, to allow for further strengthening of core musculature and decreased pelvic floor spasm.    Baseline Pt. demonstrates breathing dysfunction and poor PFM coordination evidenced by anal manometry As of 2/22: Pt. continues to have restriction in her diaphragm and near T/L junction but is able to intentionally use diaphragmatic breathing through the available ROM.     Time 5    Period Weeks    Status Achieved    Target Date 04/20/19      PT SHORT TERM GOAL #2   Title Patient will report a reduction in pain to no greater than 6/10 over the prior week to demonstrate symptom improvement.    Baseline Pain is 10/10 at worst, 2/10 at best As of 2/22: Pt. had achieved this goal for 1 week as of 1/21 but may have infection or other insult that caused pain to increase again. As of  4/29: Pain high of 8/10 but has been over-doing her activity over the past week. As of 6/10: Pt. was able to keep pain at 6 or below since prior visit by using TENS to help keep tension lower but has not had a full week to test if it will keep working. As of 7/6: Pt. had met this goal but has a new UTI and it caused pain to spike to 8/10 over the weekend. As of 11/9: Pt. has just finally made progress with long-standing UTI and new POC for treatment so she is still having pain spikes up to 8/10 but we expect greater improvement over the next few months. As of 12/30: still feels like she is trending in a better direction but over-did it over the holidays and pain spiked to 8/10. No higher than 6/10 over last 2 days, even with PMS cramping. 3/14: 8/10 pain from bladder spasm    Time 5    Period Weeks    Status On-going    Target Date 08/02/20      PT SHORT TERM GOAL #3   Title Patient will demonstrate HEP x1 in the clinic to demonstrate understanding and proper form to allow for further improvement.    Baseline Pt. lacks knowledge of therepeutic exercises that can decrease her pain/Sx.    Time 5    Period Weeks    Status Achieved    Target Date 04/20/19      PT SHORT TERM GOAL #4   Title Patient will report consistent use of foot-stool (squatty-potty) for positioning with BM to decrease pain with BM and intra-abdominal pressure.    Baseline Pt. having constipation due to PFM dysfunction    Time 5    Period Weeks    Status Achieved    Target Date 04/20/19             PT Long  Term Goals - 06/28/20 0001      PT LONG TERM GOAL #1   Title Pt. will be able to participate in regular ADL's with least restrictive device without pain increasing greater than 2/10    Baseline Pt. limited in her ability to perform household duties by increased pain and fatigue. As of 4/29: Her legs have more endurance with walking before she gets a tremor. Still needs to sit down the majority of the time she is cooking. Still needs back support for prolonged sitting. As of 6/10: Pt. able to do for  ~1 hour before she needs to rest to prevent increased pain, pain is averaging ~ 4-5/10 3/14: able to perform ADLs with less pain, does still have bad days    Time 12    Period Weeks    Status On-going    Target Date 09/20/20      PT LONG TERM GOAL #2   Title Patient will score at or below 65/300  on the PFDI and 35% on the Female NIH-CPSI to demonstrate a clinically meaningful decrease in disability and distress due to pelvic floor dysfunction.    Baseline PFDI: 110/300, Female NIH-CPSI: 29/43 (67%) As of 4/29: 100/300 on PFDI and 30/43 on NIH-CPSI As of 7/6:  NIH-CPSI: 32/43 POPDI: 61/100, CRADI-8: 29/100, UDI: 44/100 (PFDI: 149/300), As of 04/15/20: Female NIH-CPSI: 29/43, PFDI: 84/300; 3/14: cmomposite 126/300    Time 12    Period Weeks    Status On-going    Target Date 09/20/20      PT LONG TERM GOAL #3   Title Patient will  report no pain with intercourse to demonstrate improved functional ability.    Baseline Pt. Having significant pain with intercourse. As of 4/29: is a little better (20-30%) but is worse on days where overall tension/pain is worse. As of 6/10: can have days where there is not much pain but other days where there is significant pain. As of 11/9: same as previous update. AS of 04/15/20: 25% improved from base-line 3/14: to be asssessed next session by pelvic therapist    Time 12    Period Weeks    Status Partially Met    Target Date 09/20/20      PT LONG TERM GOAL #5    Title Pt. will be able to go 2-3 hours between emptying her bladder without increased pain and empty her bladder fully whether by urostomy, cath, or voluntary release.    Baseline Pt. has a urostomy but continues to have to self-catheterize. Has pain with bladder filling and when using catheter. As of 4/29: is doing better since starting to take the pregabalin andd baclofen but occasionally still has pain with bladder filling. It is bad when she has an infection or over-does activity, better on "normal" days which have been few and far between due to frequent UTI's. As of 6/10: Pt. has "good" and "bad" days but has more days where she can go 2-3 hours without increased pain. As of 04/15/20: able to wait 2 hours, not 3 yet 3/14: somedays able to meet this goals, some days is more frequent    Time 12    Period Weeks    Status Partially Met    Target Date 09/20/20      PT LONG TERM GOAL #6   Title Patient will report having BM's at least every-other day with consistency between [facility] stool scale 3-5 over the prior week to demonstrate decreased constipation.    Baseline Pt. unable to have regula BM's without medication, manometry shows PFM dysfunction. As of 4/29: still having to use and enema or supository occasionally and still needing linzess. As of 6/10: Pt. has days where she can empty ok and days where she needs to use an Enema to stimulate a BM, feels that it is just the insertion of the enema, not the solution that does the job. As of 11/9: Pt. not having constipation due to colonic infection that has shifted her toward diarrhea. Having BM's daily, sometimes type 5. Has had diarrhea more than constipation, still had to take linzess 1 time to go after 2 days without a BM. 3/14: to be assessed by pelvic therapist    Time 12    Period Weeks    Status On-going    Target Date 09/20/20      PT LONG TERM GOAL #7   Title Pt. will drive to a meaningful activity to demonstrate increased function and  participation    Baseline Pt. has not been able to drive due to decreased LE control 3/134: still not driving at this time    Time 12    Period Weeks    Status On-going    Target Date 09/20/20      PT LONG TERM GOAL #8   Title Pt. will be able to perform 1 hr. of light housework reqiring intermittent standing and walking without increased pain/fatigue >2 pts.    Baseline Pt. becomes fatigued washing fruits/vegetbles in small stages and has fatigue>3 pts. 3/14: able to perform but needs rest breaks    Time 12  Period Weeks    Status Partially Met    Target Date 09/20/20      PT LONG TERM GOAL  #9   TITLE Pt. will be able to attend a full athletic event for her children without Sx increasing by more than 2 pts.    Baseline Pt. having migraine or severe bladder spasms and fatigue and back pain> 3 pt. increase with attendance at athletic events 3/14: has increased pain but is able to attend now, has to use TENS unit    Time 12    Period Weeks    Status Partially Met    Target Date 09/20/20                 Plan - 07/19/20 1410    Clinical Impression Statement Pt. Responded well to all interventions today, demonstrating improved cervical mobility by ~ 50%, decreased spasm and TTP, and improved fascial mobility at her suprapubic scar, as well as understanding and correct performance of all education and exercises provided today. They will continue to benefit from skilled physical therapy to work toward remaining goals and maximize function as well as decrease likelihood of symptom increase or recurrence.     PT Next Visit Plan use balance game for 5 min., ask about stationary bike success/fatigue levels. Ask about taping/re-apply, further suprapubic scar work, deep-core, scapular tracking/strength, and balance, MFR to lower abdomen, Review pain diary, cupping to medial thighs and upper back, re-assess neural glides on LLE, focus on graded activity, and seated and standing balance/core  strength. review I's Y's and T's in prone, thoracic mobility? perform further release and PNF for R>L shoulder blades, MFR around B ilium. continue to work on thoracolumbar rotation ROM and decreasing spasms    PT Home Exercise Plan A day: low back stretch, side-stretch, hamstring stretch, butterfly stretch, hip EXT in prone with knee bent, standing hip ABD/EXT, calf raises. B day: Chest-stretch, Thoracic extensions over a towel roll, seated rows and seated scapular retraction (pull shoulder blades together like "T's") with band, child's pose stretch, self internal TP release (especially when Pelvic floor spasms high), Every day: Pain diary, Posterior pelvic tilts with deep core activation and red band for obliques activation, bow-and-arrow, TENS unit 3+ times per day for 30 min and as needed if pain starts to increase. TENS at ASIS/PSIS at strong tingle for 1 hr. per day. Sleeper stretch PRN, vagal nerve toning    Consulted and Agree with Plan of Care Patient           Patient will benefit from skilled therapeutic intervention in order to improve the following deficits and impairments:     Visit Diagnosis: Other muscle spasm  Muscle weakness (generalized)  Difficulty in walking, not elsewhere classified     Problem List Patient Active Problem List   Diagnosis Date Noted  . Seizure ([place]) 11/11/2018  . Major depressive disorder, recurrent episode, moderate ([place]) 02/07/2018  . Nephrolithiasis 04/16/2016  . Numbness 07/28/2015  . Bladder retention 06/23/2015  . Abdominal pain 06/04/2015  . Dizziness 05/18/2015  . Neck pain 05/18/2015  . Complicated migraine 19/41/7408  . Other fatigue 04/28/2015  . Abnormal finding on MRI of brain 04/28/2015  . D (diarrhea) 03/29/2015  . H/O disease 03/29/2015  . Abnormal weight loss 03/29/2015  . Muscle weakness (generalized) 03/14/2015  . Headache, migraine 03/10/2015   Willa Rough DPT, ATC Willa Rough 07/19/2020, 3:48 PM  [place] MAIN [facility] SERVICES [address]  [place], [place], [address] Phone: [phone]   Fax:  [phone]  Name: Christine Chavez MRN: [id number] Date of Birth: 1980-07-25

## 2020-07-22 ENCOUNTER — Other Ambulatory Visit: Payer: Self-pay

## 2020-07-22 ENCOUNTER — Ambulatory Visit: Payer: BC Managed Care – PPO

## 2020-07-22 DIAGNOSIS — M6281 Muscle weakness (generalized): Secondary | ICD-10-CM

## 2020-07-22 DIAGNOSIS — M62838 Other muscle spasm: Secondary | ICD-10-CM | POA: Diagnosis not present

## 2020-07-22 DIAGNOSIS — R262 Difficulty in walking, not elsewhere classified: Secondary | ICD-10-CM

## 2020-07-22 NOTE — Therapy (Signed)
[place] MAIN [facility] SERVICES [address] [place], [place], [address] Phone: [phone]   Fax:  [phone]  Physical Therapy Treatment  Patient Details  Name: Juna Caban MRN: [id number] Date of Birth: 11-Apr-1981 No data recorded  Encounter Date: 07/22/2020   PT End of Session - 07/22/20 1321    Visit Number 96    Number of Visits 114    Date for PT Re-Evaluation 09/20/20    Authorization Type [employer]; 4 session balance PT    Authorization Time Period 06/28/20-09/20/20    Authorization - Visit Number 15    Authorization - Number of Visits 20    Progress Note Due on Visit 98    PT Start Time 0930    PT Stop Time 4970    PT Time Calculation (min) 44 min    Activity Tolerance Patient tolerated treatment well;No increased pain    Behavior During Therapy WFL for tasks assessed/performed           Past Medical History:  Diagnosis Date  . Complication of anesthesia    ? seizures after anesthesia   . Headache   . Migraines   . Neurogenic bladder   . Renal disorder   . Vision abnormalities     Past Surgical History:  Procedure Laterality Date  . ANTERIOR CRUCIATE LIGAMENT REPAIR  1997  . APPENDECTOMY    . COLONOSCOPY WITH PROPOFOL N/A 11/11/2018   Procedure: COLONOSCOPY WITH PROPOFOL;  Surgeon: [name], MD;  Location: [facility] ENDOSCOPY;  Service: Endoscopy;  Laterality: N/A;  . CYSTOSCOPY WITH STENT PLACEMENT Right 04/17/2016   Procedure: CYSTOSCOPY WITH STENT PLACEMENT;  Surgeon: [name], MD;  Location: [facility] ORS;  Service: Urology;  Laterality: Right;  . ESOPHAGOGASTRODUODENOSCOPY (EGD) WITH PROPOFOL N/A 11/11/2018   Procedure: ESOPHAGOGASTRODUODENOSCOPY (EGD) WITH PROPOFOL;  Surgeon: [name], MD;  Location: [facility] ENDOSCOPY;  Service: Endoscopy;  Laterality: N/A;  . EXPLORATORY LAPAROTOMY  1999  . KIDNEY STONE SURGERY  04/2016  . REVISION UROSTOMY CUTANEOUS    . REVISION UROSTOMY CUTANEOUS  01/10/2018   . SUPRAPUBIC CATHETER PLACEMENT  08/2017  . TONSILLECTOMY      There were no vitals filed for this visit.   Subjective Assessment - 07/22/20 0932    Subjective Patient started taking tizanidine HCL 2 MG for her bladder.    Limitations Lifting;Standing;Walking;House hold activities;Other (comment)    Patient Stated Goals to regain PLOF    Currently in Pain? Yes    Pain Score 4     Pain Location Back    Pain Orientation Lower    Pain Descriptors / Indicators Aching    Pain Type Chronic pain    Pain Onset More than a month ago               Treatment:  In // bars: close CGA:   Forward/backwards ambulation 2x in // bars.  Hedgehog taps: stabilization provided to each knee; three color taps 4x each LE; very challenging.  bosu forward modified partial lunge : stabilization provided to knees to prevent buckling 8x each LE; very fatiguing bosu lateral partial modified lunge: stabilization to knee provided 8x each LE.  mini squat with BUE support 10x   static stand 30 seconds  Seated:  GTB 12x abduction each LE against PT resistance Green ball adduction 10x 4 second holds  Green ball between feet: adduction with LAQ 10x Ankle heel toe raise 15x      Patient performed with  instruction, verbal cues, tactile cues of therapist: goal: increase tissue extensibility, promote proper posture, improve mobility  Patient remains highly motivated. She reports increased swelling and change of color of feet yesterday; educated on prolonged sitting and ankle pumps for improved blood flow. Standing is limited by bilateral knee ataxic movements as well as fatigue however no increase in hip pain in weightbearing noted this session. Patient will benefit from skilled physical therapy to increase strength, stability, and decrease fall risk for improved quality of life                  PT Education - 07/22/20 1321    Education Details exercise technique, body mechanics    Person(s)  Educated Patient    Methods Explanation;Demonstration;Tactile cues;Verbal cues    Comprehension Verbalized understanding;Returned demonstration;Verbal cues required;Tactile cues required            PT Short Term Goals - 06/28/20 1233      PT SHORT TERM GOAL #1   Title Patient will demonstrate coordinated diaphragmatic breathing with pelvic tilts to demonstrate improved control of diaphragm and TA, to allow for further strengthening of core musculature and decreased pelvic floor spasm.    Baseline Pt. demonstrates breathing dysfunction and poor PFM coordination evidenced by anal manometry As of 2/22: Pt. continues to have restriction in her diaphragm and near T/L junction but is able to intentionally use diaphragmatic breathing through the available ROM.    Time 5    Period Weeks    Status Achieved    Target Date 04/20/19      PT SHORT TERM GOAL #2   Title Patient will report a reduction in pain to no greater than 6/10 over the prior week to demonstrate symptom improvement.    Baseline Pain is 10/10 at worst, 2/10 at best As of 2/22: Pt. had achieved this goal for 1 week as of 1/21 but may have infection or other insult that caused pain to increase again. As of 4/29: Pain high of 8/10 but has been over-doing her activity over the past week. As of 6/10: Pt. was able to keep pain at 6 or below since prior visit by using TENS to help keep tension lower but has not had a full week to test if it will keep working. As of 7/6: Pt. had met this goal but has a new UTI and it caused pain to spike to 8/10 over the weekend. As of 11/9: Pt. has just finally made progress with long-standing UTI and new POC for treatment so she is still having pain spikes up to 8/10 but we expect greater improvement over the next few months. As of 12/30: still feels like she is trending in a better direction but over-did it over the holidays and pain spiked to 8/10. No higher than 6/10 over last 2 days, even with PMS cramping.  3/14: 8/10 pain from bladder spasm    Time 5    Period Weeks    Status On-going    Target Date 08/02/20      PT SHORT TERM GOAL #3   Title Patient will demonstrate HEP x1 in the clinic to demonstrate understanding and proper form to allow for further improvement.    Baseline Pt. lacks knowledge of therepeutic exercises that can decrease her pain/Sx.    Time 5    Period Weeks    Status Achieved    Target Date 04/20/19      PT SHORT TERM GOAL #4   Title  Patient will report consistent use of foot-stool (squatty-potty) for positioning with BM to decrease pain with BM and intra-abdominal pressure.    Baseline Pt. having constipation due to PFM dysfunction    Time 5    Period Weeks    Status Achieved    Target Date 04/20/19             PT Long Term Goals - 06/28/20 0001      PT LONG TERM GOAL #1   Title Pt. will be able to participate in regular ADL's with least restrictive device without pain increasing greater than 2/10    Baseline Pt. limited in her ability to perform household duties by increased pain and fatigue. As of 4/29: Her legs have more endurance with walking before she gets a tremor. Still needs to sit down the majority of the time she is cooking. Still needs back support for prolonged sitting. As of 6/10: Pt. able to do for  ~1 hour before she needs to rest to prevent increased pain, pain is averaging ~ 4-5/10 3/14: able to perform ADLs with less pain, does still have bad days    Time 12    Period Weeks    Status On-going    Target Date 09/20/20      PT LONG TERM GOAL #2   Title Patient will score at or below 65/300  on the PFDI and 35% on the Female NIH-CPSI to demonstrate a clinically meaningful decrease in disability and distress due to pelvic floor dysfunction.    Baseline PFDI: 110/300, Female NIH-CPSI: 29/43 (67%) As of 4/29: 100/300 on PFDI and 30/43 on NIH-CPSI As of 7/6:  NIH-CPSI: 32/43 POPDI: 61/100, CRADI-8: 29/100, UDI: 44/100 (PFDI: 149/300), As of  04/15/20: Female NIH-CPSI: 29/43, PFDI: 84/300; 3/14: cmomposite 126/300    Time 12    Period Weeks    Status On-going    Target Date 09/20/20      PT LONG TERM GOAL #3   Title Patient will report no pain with intercourse to demonstrate improved functional ability.    Baseline Pt. Having significant pain with intercourse. As of 4/29: is a little better (20-30%) but is worse on days where overall tension/pain is worse. As of 6/10: can have days where there is not much pain but other days where there is significant pain. As of 11/9: same as previous update. AS of 04/15/20: 25% improved from base-line 3/14: to be asssessed next session by pelvic therapist    Time 12    Period Weeks    Status Partially Met    Target Date 09/20/20      PT LONG TERM GOAL #5   Title Pt. will be able to go 2-3 hours between emptying her bladder without increased pain and empty her bladder fully whether by urostomy, cath, or voluntary release.    Baseline Pt. has a urostomy but continues to have to self-catheterize. Has pain with bladder filling and when using catheter. As of 4/29: is doing better since starting to take the pregabalin andd baclofen but occasionally still has pain with bladder filling. It is bad when she has an infection or over-does activity, better on "normal" days which have been few and far between due to frequent UTI's. As of 6/10: Pt. has "good" and "bad" days but has more days where she can go 2-3 hours without increased pain. As of 04/15/20: able to wait 2 hours, not 3 yet 3/14: somedays able to meet this goals, some days is more  frequent    Time 12    Period Weeks    Status Partially Met    Target Date 09/20/20      PT LONG TERM GOAL #6   Title Patient will report having BM's at least every-other day with consistency between [facility] stool scale 3-5 over the prior week to demonstrate decreased constipation.    Baseline Pt. unable to have regula BM's without medication, manometry shows PFM  dysfunction. As of 4/29: still having to use and enema or supository occasionally and still needing linzess. As of 6/10: Pt. has days where she can empty ok and days where she needs to use an Enema to stimulate a BM, feels that it is just the insertion of the enema, not the solution that does the job. As of 11/9: Pt. not having constipation due to colonic infection that has shifted her toward diarrhea. Having BM's daily, sometimes type 5. Has had diarrhea more than constipation, still had to take linzess 1 time to go after 2 days without a BM. 3/14: to be assessed by pelvic therapist    Time 12    Period Weeks    Status On-going    Target Date 09/20/20      PT LONG TERM GOAL #7   Title Pt. will drive to a meaningful activity to demonstrate increased function and participation    Baseline Pt. has not been able to drive due to decreased LE control 3/134: still not driving at this time    Time 12    Period Weeks    Status On-going    Target Date 09/20/20      PT LONG TERM GOAL #8   Title Pt. will be able to perform 1 hr. of light housework reqiring intermittent standing and walking without increased pain/fatigue >2 pts.    Baseline Pt. becomes fatigued washing fruits/vegetbles in small stages and has fatigue>3 pts. 3/14: able to perform but needs rest breaks    Time 12    Period Weeks    Status Partially Met    Target Date 09/20/20      PT LONG TERM GOAL  #9   TITLE Pt. will be able to attend a full athletic event for her children without Sx increasing by more than 2 pts.    Baseline Pt. having migraine or severe bladder spasms and fatigue and back pain> 3 pt. increase with attendance at athletic events 3/14: has increased pain but is able to attend now, has to use TENS unit    Time 12    Period Weeks    Status Partially Met    Target Date 09/20/20                 Plan - 07/22/20 1324    Clinical Impression Statement Patient remains highly motivated. She reports increased  swelling and change of color of feet yesterday; educated on prolonged sitting and ankle pumps for improved blood flow. Standing is limited by bilateral knee ataxic movements as well as fatigue however no increase in hip pain in weightbearing noted this session. Patient will benefit from skilled physical therapy to increase strength, stability, and decrease fall risk for improved quality of life    Personal Factors and Comorbidities Comorbidity 3+    Comorbidities neurologic insult with pending diagnosis, multiple recurrent UTI's, BLE weakness, depression    Examination-Activity Limitations Caring for Others;Carry;Continence;Squat;Stairs;Toileting;Locomotion Level;Lift;Stand    Examination-Participation Restrictions Church;Cleaning;Community Activity;Driving;Interpersonal Relationship;Laundry;Yard Work;Meal Prep    Stability/Clinical Decision Making Unstable/Unpredictable  Rehab Potential Fair    PT Frequency 2x / week    PT Duration 12 weeks    PT Treatment/Interventions ADLs/Self Care Home Management;Aquatic Therapy;Biofeedback;Moist Heat;Electrical Stimulation;Cryotherapy;Traction;Ultrasound;Therapeutic activities;Functional mobility training;Stair training;Gait training;Therapeutic exercise;Balance training;Neuromuscular re-education;Patient/family education;Manual techniques;Dry needling;Scar mobilization;Energy conservation;Taping;Joint Manipulations;Spinal Manipulations;Visual/perceptual remediation/compensation;Passive range of motion    PT Next Visit Plan use balance game for 5 min., ask about stationary bike success/fatigue levels. Ask about taping/re-apply, further suprapubic scar work, deep-core, scapular tracking/strength, and balance, MFR to lower abdomen, Review pain diary, cupping to medial thighs and upper back, re-assess neural glides on LLE, focus on graded activity, and seated and standing balance/core strength. review I's Y's and T's in prone, thoracic mobility? perform further  release and PNF for R>L shoulder blades, MFR around B ilium. continue to work on thoracolumbar rotation ROM and decreasing spasms    PT Home Exercise Plan A day: low back stretch, side-stretch, hamstring stretch, butterfly stretch, hip EXT in prone with knee bent, standing hip ABD/EXT, calf raises. B day: Chest-stretch, Thoracic extensions over a towel roll, seated rows and seated scapular retraction (pull shoulder blades together like "T's") with band, child's pose stretch, self internal TP release (especially when Pelvic floor spasms high), Every day: Pain diary, Posterior pelvic tilts with deep core activation and red band for obliques activation, bow-and-arrow, TENS unit 3+ times per day for 30 min and as needed if pain starts to increase. TENS at ASIS/PSIS at strong tingle for 1 hr. per day. Sleeper stretch PRN, vagal nerve toning    Consulted and Agree with Plan of Care Patient           Patient will benefit from skilled therapeutic intervention in order to improve the following deficits and impairments:  Abnormal gait,Decreased balance,Decreased endurance,Decreased mobility,Difficulty walking,Increased muscle spasms,Impaired sensation,Improper body mechanics,Impaired tone,Decreased activity tolerance,Decreased coordination,Decreased strength,Postural dysfunction,Pain  Visit Diagnosis: Other muscle spasm  Muscle weakness (generalized)  Difficulty in walking, not elsewhere classified     Problem List Patient Active Problem List   Diagnosis Date Noted  . Seizure ([place]) 11/11/2018  . Major depressive disorder, recurrent episode, moderate ([place]) 02/07/2018  . Nephrolithiasis 04/16/2016  . Numbness 07/28/2015  . Bladder retention 06/23/2015  . Abdominal pain 06/04/2015  . Dizziness 05/18/2015  . Neck pain 05/18/2015  . Complicated migraine 33/35/4562  . Other fatigue 04/28/2015  . Abnormal finding on MRI of brain 04/28/2015  . D (diarrhea) 03/29/2015  . H/O disease 03/29/2015  .  Abnormal weight loss 03/29/2015  . Muscle weakness (generalized) 03/14/2015  . Headache, migraine 03/10/2015   Janna Arch, PT, DPT   07/22/2020, 1:25 PM  [place] MAIN [facility] SERVICES [address] [place], [place], [address] Phone: [phone]   Fax:  [phone]  Name: Naeema Patlan MRN: [id number] Date of Birth: Apr 06, 1981

## 2020-07-26 ENCOUNTER — Ambulatory Visit: Payer: BC Managed Care – PPO

## 2020-07-26 ENCOUNTER — Other Ambulatory Visit: Payer: Self-pay

## 2020-07-26 DIAGNOSIS — M6281 Muscle weakness (generalized): Secondary | ICD-10-CM

## 2020-07-26 DIAGNOSIS — M62838 Other muscle spasm: Secondary | ICD-10-CM | POA: Diagnosis not present

## 2020-07-26 DIAGNOSIS — R262 Difficulty in walking, not elsewhere classified: Secondary | ICD-10-CM

## 2020-07-26 NOTE — Therapy (Signed)
[place] MAIN [facility] SERVICES [address] [place], [place], [address] Phone: [phone]   Fax:  [phone]  Physical Therapy Treatment  The patient has been informed of current processes in place at Outpatient Rehab to protect patients from Covid-19 exposure including social distancing, schedule modifications, and new cleaning procedures. After discussing their particular risk with a therapist based on the patient's personal risk factors, the patient has decided to proceed with in-person therapy.  Patient Details  Name: Christine Chavez MRN: [id number] Date of Birth: 1981/01/17 No data recorded  Encounter Date: 07/26/2020   PT End of Session - 07/26/20 1506    Visit Number 97    Number of Visits 114    Date for PT Re-Evaluation 09/20/20    Authorization Type [employer]; 4 session balance PT    Authorization Time Period 06/28/20-09/20/20    Authorization - Visit Number 16    Authorization - Number of Visits 20    Progress Note Due on Visit 98    PT Start Time 1237    PT Stop Time 1337    PT Time Calculation (min) 60 min    Activity Tolerance Patient tolerated treatment well;No increased pain    Behavior During Therapy WFL for tasks assessed/performed           Past Medical History:  Diagnosis Date  . Complication of anesthesia    ? seizures after anesthesia   . Headache   . Migraines   . Neurogenic bladder   . Renal disorder   . Vision abnormalities     Past Surgical History:  Procedure Laterality Date  . ANTERIOR CRUCIATE LIGAMENT REPAIR  1997  . APPENDECTOMY    . COLONOSCOPY WITH PROPOFOL N/A 11/11/2018   Procedure: COLONOSCOPY WITH PROPOFOL;  Surgeon: [name], MD;  Location: [facility] ENDOSCOPY;  Service: Endoscopy;  Laterality: N/A;  . CYSTOSCOPY WITH STENT PLACEMENT Right 04/17/2016   Procedure: CYSTOSCOPY WITH STENT PLACEMENT;  Surgeon: [name], MD;  Location: [facility] ORS;  Service: Urology;  Laterality: Right;  .  ESOPHAGOGASTRODUODENOSCOPY (EGD) WITH PROPOFOL N/A 11/11/2018   Procedure: ESOPHAGOGASTRODUODENOSCOPY (EGD) WITH PROPOFOL;  Surgeon: [name], MD;  Location: [facility] ENDOSCOPY;  Service: Endoscopy;  Laterality: N/A;  . EXPLORATORY LAPAROTOMY  1999  . KIDNEY STONE SURGERY  04/2016  . REVISION UROSTOMY CUTANEOUS    . REVISION UROSTOMY CUTANEOUS  01/10/2018  . SUPRAPUBIC CATHETER PLACEMENT  08/2017  . TONSILLECTOMY      There were no vitals filed for this visit.  Pelvic Floor Physical Therapy Treatment Note  SCREENING  Changes in medications, allergies, or medical history?:   SUBJECTIVE  Patient reports: She has a higher dose of Baclofen and has also been prescribed tizanidine and she is trying to find the combination that works for her. She has had really bad bladder pain/spasms the last 2 days. Her pain medicine Dr. Roney Jaffe that they think she may be getting closer to trying the inter-stim. She is getting the ball rolling just in-case she decides to do it, has a lot of appointments around the end of the month/early May so she should be able to make an informed decision. She saw some white stuff in the tube of her night time urostomy bag and her urine is smelling worse again, she is hoping she can get labs done tomorrow. She should be starting the migraine injection soon. Had a bad migraine yesterday. She feels really tired using the Tizanidine.   Precautions:  Neurological diagnosis pending, has "non-specific" brain lesions   Pain update:  Location of pain: LB and neck/shoulders (bladder) Current pain:  5/10) (6/10) Max pain: 8/10 (8/10 when it spasms) Least pain:  3/10 (3/10) Nature of pain: sharp to dull ache (grabbing)  ** "feels a little better" following treatment.  Patient Goals: Get rid of the discomfort with catheterizing and ache/bladder pain. Be able to take fewer medications and be able to enjoy spending time with her family again, doing "all the mom things"     OBJECTIVE  Changes in:  Observations/Posture:  L lumbar curve prominent with transverse processes more prominent on the L. (from previous note)  C3 spinous process deviated L through C2-3. (from previous note)  12/30: L up-slip and anterior rotation **following treatment L iliacus remained tight, not allowing for complete up-slip or rotation correction.  07/01/20: PSIS appear level in prone, L5 spinous process prominent.  07/26/20: L up-slip and R anterior rotation.   Range of Motion/Flexibilty:  (from 03/27/19) Spine: ~ 2-3 fingers from knee with SB B. ~ 50% reduced R rotation with stretch in L, ~ 75% reduced L rotation with increased tightness. HS: able to achieve 90 deg. Hip flexion in seated HS stretch. (from prior treatment)  Decreased PIVM through Thoracic, lumbar, and sacral spine with greatest restriction at T3-6. (from prior session)  12/30:  no decreased mobility at the sacrum with attempt at manual correction for L anterior rotation, hip-flexor spasm keeping Pt. From approp  07/01/20: Decreased scar mobility in the suprapubic region.   07/08/20: Pt. Demonstrated decreased ROM in R hip IR/ER compared to L with empty end-feel. **following treatment Pt. Able to demonstrate normal ROM without pain for IR/ER and no pain with aggravating motion of getting in/out of w/c or car.  07/22/20: decreased scar mobility in supra-pubic region in cephalic [place]. Decreased L to R cervical PIVM mobility.  TODAY: decreased scar mobility in supra-pubic region in cephalic Directon.  Strength/MMT:  LE MMT (from 03-27-19) LE MMT Left Right  Hip flex:  (L2) /5 /5  Hip ext: 3+/5 3+/5  Hip abd: 4/5 4/5  Hip add: 4/5 3+/5  Hip IR 4/5 3+/5  Hip ER 4/5 4/5   (From 3/24): Pt. Demonstrates 3/5 strength with "I's, Y's, and T's" but 4/5 with W's. (from 6/22) DF: 9 reps with B. Eversion/Inversion: 4/5 on R, 4+/5 on L. DF: L 4+/5, R 3+/5  Seated IR/ER on L: 4/5, R 3+/5 (from 8-5) L DF and  Eversion 4+/5 with first 1-3 reps and fatigue decreased to 4/4. (9/9) Pt. Demonstrates ~ 4+/5 B with increased spasticity B.   Balance/stability:  (6-29) Pt. Able to achieve good posture without cueing but only able to maintain for ~ 2.5 min. Before seeking stability from the table and forward rolled shoulders. With cue to engage the deep core, Pt. Recognizes increased stability and comfort.  8/5: Pt. Demonstrating decreased stability in gait and standing balance today with fasciculations increasing in direct relation to muscular endurance required. Able to stand ~ 10 sec. In SLS with MIN UE support before intensity of spasms make attempt at balance futile.   12/30: Pt. Reports feeling less stable on LLE again, similar to August but less intense.   1/31: Able to stand for ~ 10 sec. In double leg stance and on RLE with no LOB the first attempt but required TC and VC for L glute med. Activation for LLE. Second attempt less cueing recuired for LLE but far more fasiculations noted.  Was able to stand with slow perterbations in multiple directions applied to the shoulders for ~ 20 sec. Before fatigue slowed reaction time.  2/17: performed circle maze and square maze on balance machine with BUE support. Circle: 134 sec. -4 pts. Square: 137 sec. And -17 pts.   Pelvic Floor External Exam: (From prior session)- [Introitus Appears: elevated Skin integrity: WNL Palpation: TTP to B STP Cough: limited motion Prolapse visible?: no Scar mobility: not assessed  Internal Vaginal Exam: Strength (PERF): Pt. Has difficulty sensing the PFM to generate contraction or relaxation due to high tone/neuro involvement Symmetry: L>R for spasm/TTP Palpation: TTP to all muscles throughout B Prolapse: none]  unable to coordinate squeeze and TTP to all areas with the greatest at B OI and coccygeus. ** following TP release Pt. Able to achieve 1/5 PFM contraction. Decreased TTP at B anterior PR/PC following treatment.  (10/14).  TODAY: not re-assessed today.  Abdominal:  Pt. Demonstrated ability to recruit R>L obliques and TA with pelvic tilts. (from prior session)  Palpation: Decreased fascial and scar mobility through lower abdomen with radiating Sx. To the pelvis and LB. (from prior session)  12/30: TTP to L QL and Iliacus  Murphy's punch sign: Some sharp pain that shot forward with percussion to B kidneys. (from 06/03/20)   07/01/20: TTP to B erector spinae and multifidus from L3-5  07/08/20: TTP to R adductor brevis and L iliacus and psoas.   07/22/20: TTP to L cervical extensors, B sub-occipitals   TODAY: TTP to R>L adductors  Gait Analysis:  (04/22/20) 2 min. Walk test: 12 meters on 1st attempt. 7.75 meters on 2nd attempt. (video)  Pt. Demonstrated crouched walk with difficulty controlling where she placed the R>L LE and ~ 3-4 near LOB while walking on 2nd attempt with PT at [facility] and using Rollator  (Pt. Feels like this would have been better 2-3 days ago even)  2/3: Pt. Walked ~ 50 ft. x2 With rollator and gait belt. Attempted to walk to metronome cadence but with difficulty, ended up keeping to ~ 1/2 time of the tempo with ~ 50% consistency. Some difficulty with control noted but less than during initial walk test.  INTERVENTIONS THIS SESSION: Manual: Performed scar release to suprapubic scar in cephalic direction to decrease pressure on pelvic nerves to help decrease spasms and pain. Performed MET correction x2 for R anterior rotation and up-slip correction for L innominate x2 to allow for decreased pressure on lumbosacral nerve roots, decrease spasm, and decrease Sx.  Therex: reviewed side-stretch in seated to help improve L up-slip and To maintain and improve muscle length and allow for improved balance of musculature for long-term symptom relief.   Total time: 60 min.                                 PT Short Term Goals - 06/28/20 1233      PT SHORT  TERM GOAL #1   Title Patient will demonstrate coordinated diaphragmatic breathing with pelvic tilts to demonstrate improved control of diaphragm and TA, to allow for further strengthening of core musculature and decreased pelvic floor spasm.    Baseline Pt. demonstrates breathing dysfunction and poor PFM coordination evidenced by anal manometry As of 2/22: Pt. continues to have restriction in her diaphragm and near T/L junction but is able to intentionally use diaphragmatic breathing through the available ROM.    Time 5    Period Weeks  Status Achieved    Target Date 04/20/19      PT SHORT TERM GOAL #2   Title Patient will report a reduction in pain to no greater than 6/10 over the prior week to demonstrate symptom improvement.    Baseline Pain is 10/10 at worst, 2/10 at best As of 2/22: Pt. had achieved this goal for 1 week as of 1/21 but may have infection or other insult that caused pain to increase again. As of 4/29: Pain high of 8/10 but has been over-doing her activity over the past week. As of 6/10: Pt. was able to keep pain at 6 or below since prior visit by using TENS to help keep tension lower but has not had a full week to test if it will keep working. As of 7/6: Pt. had met this goal but has a new UTI and it caused pain to spike to 8/10 over the weekend. As of 11/9: Pt. has just finally made progress with long-standing UTI and new POC for treatment so she is still having pain spikes up to 8/10 but we expect greater improvement over the next few months. As of 12/30: still feels like she is trending in a better direction but over-did it over the holidays and pain spiked to 8/10. No higher than 6/10 over last 2 days, even with PMS cramping. 3/14: 8/10 pain from bladder spasm    Time 5    Period Weeks    Status On-going    Target Date 08/02/20      PT SHORT TERM GOAL #3   Title Patient will demonstrate HEP x1 in the clinic to demonstrate understanding and proper form to allow for further  improvement.    Baseline Pt. lacks knowledge of therepeutic exercises that can decrease her pain/Sx.    Time 5    Period Weeks    Status Achieved    Target Date 04/20/19      PT SHORT TERM GOAL #4   Title Patient will report consistent use of foot-stool (squatty-potty) for positioning with BM to decrease pain with BM and intra-abdominal pressure.    Baseline Pt. having constipation due to PFM dysfunction    Time 5    Period Weeks    Status Achieved    Target Date 04/20/19             PT Long Term Goals - 06/28/20 0001      PT LONG TERM GOAL #1   Title Pt. will be able to participate in regular ADL's with least restrictive device without pain increasing greater than 2/10    Baseline Pt. limited in her ability to perform household duties by increased pain and fatigue. As of 4/29: Her legs have more endurance with walking before she gets a tremor. Still needs to sit down the majority of the time she is cooking. Still needs back support for prolonged sitting. As of 6/10: Pt. able to do for  ~1 hour before she needs to rest to prevent increased pain, pain is averaging ~ 4-5/10 3/14: able to perform ADLs with less pain, does still have bad days    Time 12    Period Weeks    Status On-going    Target Date 09/20/20      PT LONG TERM GOAL #2   Title Patient will score at or below 65/300  on the PFDI and 35% on the Female NIH-CPSI to demonstrate a clinically meaningful decrease in disability and distress due to pelvic floor  dysfunction.    Baseline PFDI: 110/300, Female NIH-CPSI: 29/43 (67%) As of 4/29: 100/300 on PFDI and 30/43 on NIH-CPSI As of 7/6:  NIH-CPSI: 32/43 POPDI: 61/100, CRADI-8: 29/100, UDI: 44/100 (PFDI: 149/300), As of 04/15/20: Female NIH-CPSI: 29/43, PFDI: 84/300; 3/14: cmomposite 126/300    Time 12    Period Weeks    Status On-going    Target Date 09/20/20      PT LONG TERM GOAL #3   Title Patient will report no pain with intercourse to demonstrate improved functional  ability.    Baseline Pt. Having significant pain with intercourse. As of 4/29: is a little better (20-30%) but is worse on days where overall tension/pain is worse. As of 6/10: can have days where there is not much pain but other days where there is significant pain. As of 11/9: same as previous update. AS of 04/15/20: 25% improved from base-line 3/14: to be asssessed next session by pelvic therapist    Time 12    Period Weeks    Status Partially Met    Target Date 09/20/20      PT LONG TERM GOAL #5   Title Pt. will be able to go 2-3 hours between emptying her bladder without increased pain and empty her bladder fully whether by urostomy, cath, or voluntary release.    Baseline Pt. has a urostomy but continues to have to self-catheterize. Has pain with bladder filling and when using catheter. As of 4/29: is doing better since starting to take the pregabalin andd baclofen but occasionally still has pain with bladder filling. It is bad when she has an infection or over-does activity, better on "normal" days which have been few and far between due to frequent UTI's. As of 6/10: Pt. has "good" and "bad" days but has more days where she can go 2-3 hours without increased pain. As of 04/15/20: able to wait 2 hours, not 3 yet 3/14: somedays able to meet this goals, some days is more frequent    Time 12    Period Weeks    Status Partially Met    Target Date 09/20/20      PT LONG TERM GOAL #6   Title Patient will report having BM's at least every-other day with consistency between [facility] stool scale 3-5 over the prior week to demonstrate decreased constipation.    Baseline Pt. unable to have regula BM's without medication, manometry shows PFM dysfunction. As of 4/29: still having to use and enema or supository occasionally and still needing linzess. As of 6/10: Pt. has days where she can empty ok and days where she needs to use an Enema to stimulate a BM, feels that it is just the insertion of the enema,  not the solution that does the job. As of 11/9: Pt. not having constipation due to colonic infection that has shifted her toward diarrhea. Having BM's daily, sometimes type 5. Has had diarrhea more than constipation, still had to take linzess 1 time to go after 2 days without a BM. 3/14: to be assessed by pelvic therapist    Time 12    Period Weeks    Status On-going    Target Date 09/20/20      PT LONG TERM GOAL #7   Title Pt. will drive to a meaningful activity to demonstrate increased function and participation    Baseline Pt. has not been able to drive due to decreased LE control 3/134: still not driving at this time    Time  12    Period Weeks    Status On-going    Target Date 09/20/20      PT LONG TERM GOAL #8   Title Pt. will be able to perform 1 hr. of light housework reqiring intermittent standing and walking without increased pain/fatigue >2 pts.    Baseline Pt. becomes fatigued washing fruits/vegetbles in small stages and has fatigue>3 pts. 3/14: able to perform but needs rest breaks    Time 12    Period Weeks    Status Partially Met    Target Date 09/20/20      PT LONG TERM GOAL  #9   TITLE Pt. will be able to attend a full athletic event for her children without Sx increasing by more than 2 pts.    Baseline Pt. having migraine or severe bladder spasms and fatigue and back pain> 3 pt. increase with attendance at athletic events 3/14: has increased pain but is able to attend now, has to use TENS unit    Time 12    Period Weeks    Status Partially Met    Target Date 09/20/20                 Plan - 07/26/20 1506    Clinical Impression Statement Pt. Responded well to all interventions today, demonstrating improved pelvic alignment, decreased scar restriction, and ~ 50% improved pelvic alignment, as well as understanding and correct performance of all education and exercises provided today. They will continue to benefit from skilled physical therapy to work toward  remaining goals and maximize function as well as decrease likelihood of symptom increase or recurrence.    PT Next Visit Plan re-assess pelvic alignment and measure LLD PRN ask about stationary bike success/fatigue levels. Ask about taping/re-apply, further suprapubic scar work, deep-core, scapular tracking/strength, and balance, MFR to lower abdomen, Review pain diary, cupping to medial thighs and upper back, re-assess neural glides on LLE, focus on graded activity, and seated and standing balance/core strength. review I's Y's and T's in prone, thoracic mobility? perform further release and PNF for R>L shoulder blades, MFR around B ilium. continue to work on thoracolumbar rotation ROM and decreasing spasms    PT Home Exercise Plan A day: low back stretch, side-stretch, hamstring stretch, butterfly stretch, hip EXT in prone with knee bent, standing hip ABD/EXT, calf raises. B day: Chest-stretch, Thoracic extensions over a towel roll, seated rows and seated scapular retraction (pull shoulder blades together like "T's") with band, child's pose stretch, self internal TP release (especially when Pelvic floor spasms high), Every day: Pain diary, Posterior pelvic tilts with deep core activation and red band for obliques activation, bow-and-arrow, TENS unit 3+ times per day for 30 min and as needed if pain starts to increase. TENS at ASIS/PSIS at strong tingle for 1 hr. per day. Sleeper stretch PRN, vagal nerve toning    Consulted and Agree with Plan of Care Patient           Patient will benefit from skilled therapeutic intervention in order to improve the following deficits and impairments:     Visit Diagnosis: Other muscle spasm  Muscle weakness (generalized)  Difficulty in walking, not elsewhere classified     Problem List Patient Active Problem List   Diagnosis Date Noted  . Seizure ([place]) 11/11/2018  . Major depressive disorder, recurrent episode, moderate ([place]) 02/07/2018  . Nephrolithiasis  04/16/2016  . Numbness 07/28/2015  . Bladder retention 06/23/2015  . Abdominal pain 06/04/2015  . Dizziness  05/18/2015  . Neck pain 05/18/2015  . Complicated migraine 17/35/6701  . Other fatigue 04/28/2015  . Abnormal finding on MRI of brain 04/28/2015  . D (diarrhea) 03/29/2015  . H/O disease 03/29/2015  . Abnormal weight loss 03/29/2015  . Muscle weakness (generalized) 03/14/2015  . Headache, migraine 03/10/2015   Willa Rough DPT, ATC Willa Rough 07/26/2020, 3:09 PM  [place] MAIN [facility] SERVICES [address] [place], [place], [address] Phone: [phone]   Fax:  [phone]  Name: Christine Chavez MRN: [id number] Date of Birth: [birth year]

## 2020-07-29 ENCOUNTER — Ambulatory Visit: Payer: BC Managed Care – PPO

## 2020-08-02 ENCOUNTER — Other Ambulatory Visit: Payer: Self-pay

## 2020-08-02 ENCOUNTER — Ambulatory Visit: Payer: BC Managed Care – PPO

## 2020-08-02 DIAGNOSIS — R262 Difficulty in walking, not elsewhere classified: Secondary | ICD-10-CM

## 2020-08-02 DIAGNOSIS — M6281 Muscle weakness (generalized): Secondary | ICD-10-CM

## 2020-08-02 DIAGNOSIS — M62838 Other muscle spasm: Secondary | ICD-10-CM | POA: Diagnosis not present

## 2020-08-02 NOTE — Therapy (Signed)
[place] MAIN [facility] SERVICES [address] [place], [place], [address] Phone: [phone]   Fax:  [phone]  Physical Therapy Treatment  Patient Details  Name: Christine Chavez MRN: [id number] Date of Birth: 06/07/1980 No data recorded  Encounter Date: 08/02/2020   PT End of Session - 08/02/20 1138    Visit Number 98    Number of Visits 114    Date for PT Re-Evaluation 09/20/20    Authorization Type [employer]; 4 session balance PT    Authorization Time Period 06/28/20-09/20/20    Authorization - Visit Number 75    Authorization - Number of Visits 20    Progress Note Due on Visit 98    PT Start Time 1030    PT Stop Time 1114    PT Time Calculation (min) 44 min    Activity Tolerance Patient tolerated treatment well;No increased pain    Behavior During Therapy WFL for tasks assessed/performed           Past Medical History:  Diagnosis Date  . Complication of anesthesia    ? seizures after anesthesia   . Headache   . Migraines   . Neurogenic bladder   . Renal disorder   . Vision abnormalities     Past Surgical History:  Procedure Laterality Date  . ANTERIOR CRUCIATE LIGAMENT REPAIR  1997  . APPENDECTOMY    . COLONOSCOPY WITH PROPOFOL N/A 11/11/2018   Procedure: COLONOSCOPY WITH PROPOFOL;  Surgeon: [name], MD;  Location: [facility] ENDOSCOPY;  Service: Endoscopy;  Laterality: N/A;  . CYSTOSCOPY WITH STENT PLACEMENT Right 04/17/2016   Procedure: CYSTOSCOPY WITH STENT PLACEMENT;  Surgeon: [name], MD;  Location: [facility] ORS;  Service: Urology;  Laterality: Right;  . ESOPHAGOGASTRODUODENOSCOPY (EGD) WITH PROPOFOL N/A 11/11/2018   Procedure: ESOPHAGOGASTRODUODENOSCOPY (EGD) WITH PROPOFOL;  Surgeon: [name], MD;  Location: [facility] ENDOSCOPY;  Service: Endoscopy;  Laterality: N/A;  . EXPLORATORY LAPAROTOMY  1999  . KIDNEY STONE SURGERY  04/2016  . REVISION UROSTOMY CUTANEOUS    . REVISION UROSTOMY CUTANEOUS  01/10/2018   . SUPRAPUBIC CATHETER PLACEMENT  08/2017  . TONSILLECTOMY      There were no vitals filed for this visit.   Subjective Assessment - 08/02/20 1133    Subjective Patient saw her PCP since last visit. Is considering the procedure but not sure yet if she is comfortable with it yet.    Limitations Lifting;Standing;Walking;House hold activities;Other (comment)    Patient Stated Goals to regain PLOF    Currently in Pain? Yes    Pain Score 5     Pain Location Back    Pain Orientation Lower    Pain Descriptors / Indicators Aching    Pain Type Chronic pain    Pain Onset More than a month ago    Pain Frequency Constant                 Treatment:  In // bars: close CGA:    Forward/backwards ambulation 6x in // bars. Cues for sequencing, foot placement. Intermittent stabilization provided to LE's.  Hedgehog taps: stabilization provided to each knee; three color taps 4x each LE; very challenging.  6" step toe taps 10x each LE; requires stabilization to weightbearing limb 6" step ups 5x each LE; BUe support, seated rest break between LE's due to fatigue, stabilization provided to each LE to prevent buckling.  Step over and back laterally half foam roller 10x each side; BUE support  Static  stand 30 seconds on airex pad x2 trials          Patient performed with instruction, verbal cues, tactile cues of therapist: goal: increase tissue extensibility, promote proper posture, improve mobility    Patient remains highly motivated throughout physical therapy session despite fatigue from standing interventions. She is able to tolerate majority of session in standing with intermittent seated rest breaks. Bilateral LE trembling increases with fatigue requiring stabilization with weightbearing from external source (PT). Patient will benefit from skilled physical therapy to increase strength, stability, and decrease fall risk for improved quality of life                      PT  Education - 08/02/20 1134    Education Details exercise technique, body mechanics    Person(s) Educated Patient    Methods Explanation;Demonstration;Tactile cues;Verbal cues    Comprehension Verbalized understanding;Returned demonstration;Verbal cues required;Tactile cues required            PT Short Term Goals - 06/28/20 1233      PT SHORT TERM GOAL #1   Title Patient will demonstrate coordinated diaphragmatic breathing with pelvic tilts to demonstrate improved control of diaphragm and TA, to allow for further strengthening of core musculature and decreased pelvic floor spasm.    Baseline Pt. demonstrates breathing dysfunction and poor PFM coordination evidenced by anal manometry As of 2/22: Pt. continues to have restriction in her diaphragm and near T/L junction but is able to intentionally use diaphragmatic breathing through the available ROM.    Time 5    Period Weeks    Status Achieved    Target Date 04/20/19      PT SHORT TERM GOAL #2   Title Patient will report a reduction in pain to no greater than 6/10 over the prior week to demonstrate symptom improvement.    Baseline Pain is 10/10 at worst, 2/10 at best As of 2/22: Pt. had achieved this goal for 1 week as of 1/21 but may have infection or other insult that caused pain to increase again. As of 4/29: Pain high of 8/10 but has been over-doing her activity over the past week. As of 6/10: Pt. was able to keep pain at 6 or below since prior visit by using TENS to help keep tension lower but has not had a full week to test if it will keep working. As of 7/6: Pt. had met this goal but has a new UTI and it caused pain to spike to 8/10 over the weekend. As of 11/9: Pt. has just finally made progress with long-standing UTI and new POC for treatment so she is still having pain spikes up to 8/10 but we expect greater improvement over the next few months. As of 12/30: still feels like she is trending in a better direction but over-did it over the  holidays and pain spiked to 8/10. No higher than 6/10 over last 2 days, even with PMS cramping. 3/14: 8/10 pain from bladder spasm    Time 5    Period Weeks    Status On-going    Target Date 08/02/20      PT SHORT TERM GOAL #3   Title Patient will demonstrate HEP x1 in the clinic to demonstrate understanding and proper form to allow for further improvement.    Baseline Pt. lacks knowledge of therepeutic exercises that can decrease her pain/Sx.    Time 5    Period Weeks    Status Achieved  Target Date 04/20/19      PT SHORT TERM GOAL #4   Title Patient will report consistent use of foot-stool (squatty-potty) for positioning with BM to decrease pain with BM and intra-abdominal pressure.    Baseline Pt. having constipation due to PFM dysfunction    Time 5    Period Weeks    Status Achieved    Target Date 04/20/19             PT Long Term Goals - 06/28/20 0001      PT LONG TERM GOAL #1   Title Pt. will be able to participate in regular ADL's with least restrictive device without pain increasing greater than 2/10    Baseline Pt. limited in her ability to perform household duties by increased pain and fatigue. As of 4/29: Her legs have more endurance with walking before she gets a tremor. Still needs to sit down the majority of the time she is cooking. Still needs back support for prolonged sitting. As of 6/10: Pt. able to do for  ~1 hour before she needs to rest to prevent increased pain, pain is averaging ~ 4-5/10 3/14: able to perform ADLs with less pain, does still have bad days    Time 12    Period Weeks    Status On-going    Target Date 09/20/20      PT LONG TERM GOAL #2   Title Patient will score at or below 65/300  on the PFDI and 35% on the Female NIH-CPSI to demonstrate a clinically meaningful decrease in disability and distress due to pelvic floor dysfunction.    Baseline PFDI: 110/300, Female NIH-CPSI: 29/43 (67%) As of 4/29: 100/300 on PFDI and 30/43 on NIH-CPSI As of  7/6:  NIH-CPSI: 32/43 POPDI: 61/100, CRADI-8: 29/100, UDI: 44/100 (PFDI: 149/300), As of 04/15/20: Female NIH-CPSI: 29/43, PFDI: 84/300; 3/14: cmomposite 126/300    Time 12    Period Weeks    Status On-going    Target Date 09/20/20      PT LONG TERM GOAL #3   Title Patient will report no pain with intercourse to demonstrate improved functional ability.    Baseline Pt. Having significant pain with intercourse. As of 4/29: is a little better (20-30%) but is worse on days where overall tension/pain is worse. As of 6/10: can have days where there is not much pain but other days where there is significant pain. As of 11/9: same as previous update. AS of 04/15/20: 25% improved from base-line 3/14: to be asssessed next session by pelvic therapist    Time 12    Period Weeks    Status Partially Met    Target Date 09/20/20      PT LONG TERM GOAL #5   Title Pt. will be able to go 2-3 hours between emptying her bladder without increased pain and empty her bladder fully whether by urostomy, cath, or voluntary release.    Baseline Pt. has a urostomy but continues to have to self-catheterize. Has pain with bladder filling and when using catheter. As of 4/29: is doing better since starting to take the pregabalin andd baclofen but occasionally still has pain with bladder filling. It is bad when she has an infection or over-does activity, better on "normal" days which have been few and far between due to frequent UTI's. As of 6/10: Pt. has "good" and "bad" days but has more days where she can go 2-3 hours without increased pain. As of 04/15/20: able to wait  2 hours, not 3 yet 3/14: somedays able to meet this goals, some days is more frequent    Time 12    Period Weeks    Status Partially Met    Target Date 09/20/20      PT LONG TERM GOAL #6   Title Patient will report having BM's at least every-other day with consistency between [facility] stool scale 3-5 over the prior week to demonstrate decreased constipation.     Baseline Pt. unable to have regula BM's without medication, manometry shows PFM dysfunction. As of 4/29: still having to use and enema or supository occasionally and still needing linzess. As of 6/10: Pt. has days where she can empty ok and days where she needs to use an Enema to stimulate a BM, feels that it is just the insertion of the enema, not the solution that does the job. As of 11/9: Pt. not having constipation due to colonic infection that has shifted her toward diarrhea. Having BM's daily, sometimes type 5. Has had diarrhea more than constipation, still had to take linzess 1 time to go after 2 days without a BM. 3/14: to be assessed by pelvic therapist    Time 12    Period Weeks    Status On-going    Target Date 09/20/20      PT LONG TERM GOAL #7   Title Pt. will drive to a meaningful activity to demonstrate increased function and participation    Baseline Pt. has not been able to drive due to decreased LE control 3/134: still not driving at this time    Time 12    Period Weeks    Status On-going    Target Date 09/20/20      PT LONG TERM GOAL #8   Title Pt. will be able to perform 1 hr. of light housework reqiring intermittent standing and walking without increased pain/fatigue >2 pts.    Baseline Pt. becomes fatigued washing fruits/vegetbles in small stages and has fatigue>3 pts. 3/14: able to perform but needs rest breaks    Time 12    Period Weeks    Status Partially Met    Target Date 09/20/20      PT LONG TERM GOAL  #9   TITLE Pt. will be able to attend a full athletic event for her children without Sx increasing by more than 2 pts.    Baseline Pt. having migraine or severe bladder spasms and fatigue and back pain> 3 pt. increase with attendance at athletic events 3/14: has increased pain but is able to attend now, has to use TENS unit    Time 12    Period Weeks    Status Partially Met    Target Date 09/20/20                 Plan - 08/02/20 1247    Clinical  Impression Statement Patient remains highly motivated throughout physical therapy session despite fatigue from standing interventions. She is able to tolerate majority of session in standing with intermittent seated rest breaks. Bilateral LE trembling increases with fatigue requiring stabilization with weightbearing from external source (PT). Patient will benefit from skilled physical therapy to increase strength, stability, and decrease fall risk for improved quality of life    Personal Factors and Comorbidities Comorbidity 3+    Comorbidities neurologic insult with pending diagnosis, multiple recurrent UTI's, BLE weakness, depression    Examination-Activity Limitations Caring for Others;Carry;Continence;Squat;Stairs;Toileting;Locomotion Level;Lift;Stand    Examination-Participation Restrictions Church;Cleaning;Community Activity;Driving;Interpersonal Relationship;Laundry;Yard Work;Meal Prep  Stability/Clinical Decision Making Unstable/Unpredictable    Rehab Potential Fair    PT Frequency 2x / week    PT Duration 12 weeks    PT Treatment/Interventions ADLs/Self Care Home Management;Aquatic Therapy;Biofeedback;Moist Heat;Electrical Stimulation;Cryotherapy;Traction;Ultrasound;Therapeutic activities;Functional mobility training;Stair training;Gait training;Therapeutic exercise;Balance training;Neuromuscular re-education;Patient/family education;Manual techniques;Dry needling;Scar mobilization;Energy conservation;Taping;Joint Manipulations;Spinal Manipulations;Visual/perceptual remediation/compensation;Passive range of motion    PT Next Visit Plan use balance game for 5 min., ask about stationary bike success/fatigue levels. Ask about taping/re-apply, further suprapubic scar work, deep-core, scapular tracking/strength, and balance, MFR to lower abdomen, Review pain diary, cupping to medial thighs and upper back, re-assess neural glides on LLE, focus on graded activity, and seated and standing balance/core  strength. review I's Y's and T's in prone, thoracic mobility? perform further release and PNF for R>L shoulder blades, MFR around B ilium. continue to work on thoracolumbar rotation ROM and decreasing spasms    PT Home Exercise Plan A day: low back stretch, side-stretch, hamstring stretch, butterfly stretch, hip EXT in prone with knee bent, standing hip ABD/EXT, calf raises. B day: Chest-stretch, Thoracic extensions over a towel roll, seated rows and seated scapular retraction (pull shoulder blades together like "T's") with band, child's pose stretch, self internal TP release (especially when Pelvic floor spasms high), Every day: Pain diary, Posterior pelvic tilts with deep core activation and red band for obliques activation, bow-and-arrow, TENS unit 3+ times per day for 30 min and as needed if pain starts to increase. TENS at ASIS/PSIS at strong tingle for 1 hr. per day. Sleeper stretch PRN, vagal nerve toning    Consulted and Agree with Plan of Care Patient           Patient will benefit from skilled therapeutic intervention in order to improve the following deficits and impairments:  Abnormal gait,Decreased balance,Decreased endurance,Decreased mobility,Difficulty walking,Increased muscle spasms,Impaired sensation,Improper body mechanics,Impaired tone,Decreased activity tolerance,Decreased coordination,Decreased strength,Postural dysfunction,Pain  Visit Diagnosis: Other muscle spasm  Muscle weakness (generalized)  Difficulty in walking, not elsewhere classified     Problem List Patient Active Problem List   Diagnosis Date Noted  . Seizure ([place]) 11/11/2018  . Major depressive disorder, recurrent episode, moderate ([place]) 02/07/2018  . Nephrolithiasis 04/16/2016  . Numbness 07/28/2015  . Bladder retention 06/23/2015  . Abdominal pain 06/04/2015  . Dizziness 05/18/2015  . Neck pain 05/18/2015  . Complicated migraine 90/24/0973  . Other fatigue 04/28/2015  . Abnormal finding on MRI of  brain 04/28/2015  . D (diarrhea) 03/29/2015  . H/O disease 03/29/2015  . Abnormal weight loss 03/29/2015  . Muscle weakness (generalized) 03/14/2015  . Headache, migraine 03/10/2015   Janna Arch, PT, DPT   08/02/2020, 12:49 PM  [place] MAIN [facility] SERVICES [address] [place], [place], [address] Phone: [phone]   Fax:  [phone]  Name: Christine Chavez MRN: [id number] Date of Birth: 11-05-1980

## 2020-08-05 ENCOUNTER — Other Ambulatory Visit: Payer: Self-pay

## 2020-08-05 ENCOUNTER — Ambulatory Visit: Payer: BC Managed Care – PPO

## 2020-08-05 DIAGNOSIS — M6281 Muscle weakness (generalized): Secondary | ICD-10-CM

## 2020-08-05 DIAGNOSIS — M62838 Other muscle spasm: Secondary | ICD-10-CM

## 2020-08-05 DIAGNOSIS — R262 Difficulty in walking, not elsewhere classified: Secondary | ICD-10-CM

## 2020-08-05 NOTE — Therapy (Signed)
[place] [facility] [address] [place], [place], [address] Phone: [phone]   Fax:  [phone]  Physical Therapy Treatment  The patient has been informed of current processes in place at Outpatient Rehab to protect patients from Covid-19 exposure including social distancing, schedule modifications, and new cleaning procedures. After discussing their particular risk with a therapist based on the patient's personal risk factors, the patient has decided to proceed with in-person therapy.   Patient Details  Name: Christine Chavez MRN: [id number] Date of Birth: 07-Feb-1981 No data recorded  Encounter Date: 08/05/2020   PT End of Session - 08/05/20 2151    Visit Number 99    Number of Visits 114    Date for PT Re-Evaluation 09/20/20    Authorization Type [employer]; 4 session balance PT    Authorization Time Period 06/28/20-09/20/20    Authorization - Visit Number 18    Authorization - Number of Visits 20    Progress Note Due on Visit 98    PT Start Time 0935    PT Stop Time 1245    PT Time Calculation (min) 60 min    Activity Tolerance Patient tolerated treatment well;No increased pain    Behavior During Therapy WFL for tasks assessed/performed           Past Medical History:  Diagnosis Date  . Complication of anesthesia    ? seizures after anesthesia   . Headache   . Migraines   . Neurogenic bladder   . Renal disorder   . Vision abnormalities     Past Surgical History:  Procedure Laterality Date  . ANTERIOR CRUCIATE LIGAMENT REPAIR  1997  . APPENDECTOMY    . COLONOSCOPY WITH PROPOFOL N/A 11/11/2018   Procedure: COLONOSCOPY WITH PROPOFOL;  Surgeon: [name], MD;  Location: [facility] ENDOSCOPY;  Service: Endoscopy;  Laterality: N/A;  . CYSTOSCOPY WITH STENT PLACEMENT Right 04/17/2016   Procedure: CYSTOSCOPY WITH STENT PLACEMENT;  Surgeon: [name], MD;  Location: [facility] ORS;  Service: Urology;  Laterality: Right;   . ESOPHAGOGASTRODUODENOSCOPY (EGD) WITH PROPOFOL N/A 11/11/2018   Procedure: ESOPHAGOGASTRODUODENOSCOPY (EGD) WITH PROPOFOL;  Surgeon: [name], MD;  Location: [facility] ENDOSCOPY;  Service: Endoscopy;  Laterality: N/A;  . EXPLORATORY LAPAROTOMY  1999  . KIDNEY STONE SURGERY  04/2016  . REVISION UROSTOMY CUTANEOUS    . REVISION UROSTOMY CUTANEOUS  01/10/2018  . SUPRAPUBIC CATHETER PLACEMENT  08/2017  . TONSILLECTOMY      There were no vitals filed for this visit.  Pelvic Floor Physical Therapy Treatment Note  SCREENING  Changes in medications, allergies, or medical history?:   SUBJECTIVE  Patient reports: She has been on a roller coaster since last week. She has some days where it seems like the meds have helped calm things down and she has been having a lot of pelvic floor/hip spasms and pain.  Precautions:  Neurological diagnosis pending, has "non-specific" brain lesions   Pain update:  Location of pain: LB and neck/shoulders (bladder) Current pain:  5/10) (6/10) Max pain: 8/10 (8/10 when it spasms) Least pain:  3/10 (3/10) Nature of pain: sharp to dull ache (grabbing)  ** "feels a little better" following treatment especially in the L hip.  Patient Goals: Get rid of the discomfort with catheterizing and ache/bladder pain. Be able to take fewer medications and be able to enjoy spending time with her family again, doing "all the mom things"    OBJECTIVE  Changes in:  Observations/Posture:  L lumbar curve prominent with transverse processes more prominent on the L. (from previous note)  C3 spinous process deviated L through C2-3. (from previous note)  12/30: L up-slip and anterior rotation **following treatment L iliacus remained tight, not allowing for complete up-slip or rotation correction.  07/01/20: PSIS appear level in prone, L5 spinous process prominent.  07/26/20: L up-slip and R anterior rotation.   Range of Motion/Flexibilty:  (from  03/27/19) Spine: ~ 2-3 fingers from knee with SB B. ~ 50% reduced R rotation with stretch in L, ~ 75% reduced L rotation with increased tightness. HS: able to achieve 90 deg. Hip flexion in seated HS stretch. (from prior treatment)  Decreased PIVM through Thoracic, lumbar, and sacral spine with greatest restriction at T3-6. (from prior session)  12/30:  no decreased mobility at the sacrum with attempt at manual correction for L anterior rotation, hip-flexor spasm keeping Pt. From approp  07/01/20: Decreased scar mobility in the suprapubic region.   07/08/20: Pt. Demonstrated decreased ROM in R hip IR/ER compared to L with empty end-feel. **following treatment Pt. Able to demonstrate normal ROM without pain for IR/ER and no pain with aggravating motion of getting in/out of w/c or car.  07/22/20: decreased scar mobility in supra-pubic region in cephalic Directon. Decreased L to R cervical PIVM mobility.  TODAY: decreased scar mobility in supra-pubic region in cephalic Directon.  Strength/MMT:  LE MMT (from 03-27-19) LE MMT Left Right  Hip flex:  (L2) /5 /5  Hip ext: 3+/5 3+/5  Hip abd: 4/5 4/5  Hip add: 4/5 3+/5  Hip IR 4/5 3+/5  Hip ER 4/5 4/5   (From 3/24): Pt. Demonstrates 3/5 strength with "I's, Y's, and T's" but 4/5 with W's. (from 6/22) DF: 9 reps with B. Eversion/Inversion: 4/5 on R, 4+/5 on L. DF: L 4+/5, R 3+/5  Seated IR/ER on L: 4/5, R 3+/5 (from 8-5) L DF and Eversion 4+/5 with first 1-3 reps and fatigue decreased to 4/4. (9/9) Pt. Demonstrates ~ 4+/5 B with increased spasticity B.   Balance/stability:  (6-29) Pt. Able to achieve good posture without cueing but only able to maintain for ~ 2.5 min. Before seeking stability from the table and forward rolled shoulders. With cue to engage the deep core, Pt. Recognizes increased stability and comfort.  8/5: Pt. Demonstrating decreased stability in gait and standing balance today with fasciculations increasing in direct relation  to muscular endurance required. Able to stand ~ 10 sec. In SLS with MIN UE support before intensity of spasms make attempt at balance futile.   12/30: Pt. Reports feeling less stable on LLE again, similar to August but less intense.   1/31: Able to stand for ~ 10 sec. In double leg stance and on RLE with no LOB the first attempt but required TC and VC for L glute med. Activation for LLE. Second attempt less cueing recuired for LLE but far more fasiculations noted. Was able to stand with slow perterbations in multiple directions applied to the shoulders for ~ 20 sec. Before fatigue slowed reaction time.  2/17: performed circle maze and square maze on balance machine with BUE support. Circle: 134 sec. -4 pts. Square: 137 sec. And -17 pts.   Pelvic Floor External Exam: (From prior session)- [Introitus Appears: elevated Skin integrity: WNL Palpation: TTP to B STP Cough: limited motion Prolapse visible?: no Scar mobility: not assessed  Internal Vaginal Exam: Strength (PERF): Pt. Has difficulty sensing the PFM to generate contraction or relaxation  due to high tone/neuro involvement Symmetry: L>R for spasm/TTP Palpation: TTP to all muscles throughout B Prolapse: none]  unable to coordinate squeeze and TTP to all areas with the greatest at B OI and coccygeus. ** following TP release Pt. Able to achieve 1/5 PFM contraction. Decreased TTP at B anterior PR/PC following treatment. (10/14).  TODAY: not re-assessed today.  Abdominal:  Pt. Demonstrated ability to recruit R>L obliques and TA with pelvic tilts. (from prior session)  Palpation: Decreased fascial and scar mobility through lower abdomen with radiating Sx. To the pelvis and LB. (from prior session)  12/30: TTP to L QL and Iliacus  Murphy's punch sign: Some sharp pain that shot forward with percussion to B kidneys. (from 06/03/20)   07/01/20: TTP to B erector spinae and multifidus from L3-5  07/08/20: TTP to R adductor brevis and  L iliacus and psoas.   07/22/20: TTP to L cervical extensors, B sub-occipitals   07/26/20: TTP to R>L adductors.  TODAY: TTP to B iliacus   Gait Analysis:  (04/22/20) 2 min. Walk test: 12 meters on 1st attempt. 7.75 meters on 2nd attempt. (video)  Pt. Demonstrated crouched walk with difficulty controlling where she placed the R>L LE and ~ 3-4 near LOB while walking on 2nd attempt with PT at [facility] and using Rollator  (Pt. Feels like this would have been better 2-3 days ago even)  2/3: Pt. Walked ~ 50 ft. x2 With rollator and gait belt. Attempted to walk to metronome cadence but with difficulty, ended up keeping to ~ 1/2 time of the tempo with ~ 50% consistency. Some difficulty with control noted but less than during initial walk test.  INTERVENTIONS THIS SESSION: Manual: Performed scar release to suprapubic scar in cephalic direction while also performing TPDN/manual release to B Iliacus to decrease pressure on pelvic nerves to help decrease spasms and pain.  Total time: 60 min.                              PT Short Term Goals - 06/28/20 1233      PT SHORT TERM GOAL #1   Title Patient will demonstrate coordinated diaphragmatic breathing with pelvic tilts to demonstrate improved control of diaphragm and TA, to allow for further strengthening of core musculature and decreased pelvic floor spasm.    Baseline Pt. demonstrates breathing dysfunction and poor PFM coordination evidenced by anal manometry As of 2/22: Pt. continues to have restriction in her diaphragm and near T/L junction but is able to intentionally use diaphragmatic breathing through the available ROM.    Time 5    Period Weeks    Status Achieved    Target Date 04/20/19      PT SHORT TERM GOAL #2   Title Patient will report a reduction in pain to no greater than 6/10 over the prior week to demonstrate symptom improvement.    Baseline Pain is 10/10 at worst, 2/10 at best As of 2/22: Pt. had achieved  this goal for 1 week as of 1/21 but may have infection or other insult that caused pain to increase again. As of 4/29: Pain high of 8/10 but has been over-doing her activity over the past week. As of 6/10: Pt. was able to keep pain at 6 or below since prior visit by using TENS to help keep tension lower but has not had a full week to test if it will keep working. As of 7/6: Pt.  had met this goal but has a new UTI and it caused pain to spike to 8/10 over the weekend. As of 11/9: Pt. has just finally made progress with long-standing UTI and new POC for treatment so she is still having pain spikes up to 8/10 but we expect greater improvement over the next few months. As of 12/30: still feels like she is trending in a better direction but over-did it over the holidays and pain spiked to 8/10. No higher than 6/10 over last 2 days, even with PMS cramping. 3/14: 8/10 pain from bladder spasm    Time 5    Period Weeks    Status On-going    Target Date 08/02/20      PT SHORT TERM GOAL #3   Title Patient will demonstrate HEP x1 in the clinic to demonstrate understanding and proper form to allow for further improvement.    Baseline Pt. lacks knowledge of therepeutic exercises that can decrease her pain/Sx.    Time 5    Period Weeks    Status Achieved    Target Date 04/20/19      PT SHORT TERM GOAL #4   Title Patient will report consistent use of foot-stool (squatty-potty) for positioning with BM to decrease pain with BM and intra-abdominal pressure.    Baseline Pt. having constipation due to PFM dysfunction    Time 5    Period Weeks    Status Achieved    Target Date 04/20/19             PT Long Term Goals - 06/28/20 0001      PT LONG TERM GOAL #1   Title Pt. will be able to participate in regular ADL's with least restrictive device without pain increasing greater than 2/10    Baseline Pt. limited in her ability to perform household duties by increased pain and fatigue. As of 4/29: Her legs have  more endurance with walking before she gets a tremor. Still needs to sit down the majority of the time she is cooking. Still needs back support for prolonged sitting. As of 6/10: Pt. able to do for  ~1 hour before she needs to rest to prevent increased pain, pain is averaging ~ 4-5/10 3/14: able to perform ADLs with less pain, does still have bad days    Time 12    Period Weeks    Status On-going    Target Date 09/20/20      PT LONG TERM GOAL #2   Title Patient will score at or below 65/300  on the PFDI and 35% on the Female NIH-CPSI to demonstrate a clinically meaningful decrease in disability and distress due to pelvic floor dysfunction.    Baseline PFDI: 110/300, Female NIH-CPSI: 29/43 (67%) As of 4/29: 100/300 on PFDI and 30/43 on NIH-CPSI As of 7/6:  NIH-CPSI: 32/43 POPDI: 61/100, CRADI-8: 29/100, UDI: 44/100 (PFDI: 149/300), As of 04/15/20: Female NIH-CPSI: 29/43, PFDI: 84/300; 3/14: cmomposite 126/300    Time 12    Period Weeks    Status On-going    Target Date 09/20/20      PT LONG TERM GOAL #3   Title Patient will report no pain with intercourse to demonstrate improved functional ability.    Baseline Pt. Having significant pain with intercourse. As of 4/29: is a little better (20-30%) but is worse on days where overall tension/pain is worse. As of 6/10: can have days where there is not much pain but other days where there is significant  pain. As of 11/9: same as previous update. AS of 04/15/20: 25% improved from base-line 3/14: to be asssessed next session by pelvic therapist    Time 12    Period Weeks    Status Partially Met    Target Date 09/20/20      PT LONG TERM GOAL #5   Title Pt. will be able to go 2-3 hours between emptying her bladder without increased pain and empty her bladder fully whether by urostomy, cath, or voluntary release.    Baseline Pt. has a urostomy but continues to have to self-catheterize. Has pain with bladder filling and when using catheter. As of 4/29: is  doing better since starting to take the pregabalin andd baclofen but occasionally still has pain with bladder filling. It is bad when she has an infection or over-does activity, better on "normal" days which have been few and far between due to frequent UTI's. As of 6/10: Pt. has "good" and "bad" days but has more days where she can go 2-3 hours without increased pain. As of 04/15/20: able to wait 2 hours, not 3 yet 3/14: somedays able to meet this goals, some days is more frequent    Time 12    Period Weeks    Status Partially Met    Target Date 09/20/20      PT LONG TERM GOAL #6   Title Patient will report having BM's at least every-other day with consistency between [facility] stool scale 3-5 over the prior week to demonstrate decreased constipation.    Baseline Pt. unable to have regula BM's without medication, manometry shows PFM dysfunction. As of 4/29: still having to use and enema or supository occasionally and still needing linzess. As of 6/10: Pt. has days where she can empty ok and days where she needs to use an Enema to stimulate a BM, feels that it is just the insertion of the enema, not the solution that does the job. As of 11/9: Pt. not having constipation due to colonic infection that has shifted her toward diarrhea. Having BM's daily, sometimes type 5. Has had diarrhea more than constipation, still had to take linzess 1 time to go after 2 days without a BM. 3/14: to be assessed by pelvic therapist    Time 12    Period Weeks    Status On-going    Target Date 09/20/20      PT LONG TERM GOAL #7   Title Pt. will drive to a meaningful activity to demonstrate increased function and participation    Baseline Pt. has not been able to drive due to decreased LE control 3/134: still not driving at this time    Time 12    Period Weeks    Status On-going    Target Date 09/20/20      PT LONG TERM GOAL #8   Title Pt. will be able to perform 1 hr. of light housework reqiring intermittent  standing and walking without increased pain/fatigue >2 pts.    Baseline Pt. becomes fatigued washing fruits/vegetbles in small stages and has fatigue>3 pts. 3/14: able to perform but needs rest breaks    Time 12    Period Weeks    Status Partially Met    Target Date 09/20/20      PT LONG TERM GOAL  #9   TITLE Pt. will be able to attend a full athletic event for her children without Sx increasing by more than 2 pts.    Baseline Pt. having migraine  or severe bladder spasms and fatigue and back pain> 3 pt. increase with attendance at athletic events 3/14: has increased pain but is able to attend now, has to use TENS unit    Time 12    Period Weeks    Status Partially Met    Target Date 09/20/20                 Plan - 08/05/20 2153    Clinical Impression Statement Pt. Responded well to all interventions today, demonstrating decreased spasm and improved fascial mobility, as well as understanding and correct performance of all education and exercises provided today. They will continue to benefit from skilled physical therapy to work toward remaining goals and maximize function as well as decrease likelihood of symptom increase or recurrence.    PT Next Visit Plan use balance game for 5 min., ask about stationary bike success/fatigue levels. Ask about taping/re-apply, further suprapubic scar work, deep-core, scapular tracking/strength, and balance, MFR to lower abdomen, Review pain diary, cupping to medial thighs and upper back, re-assess neural glides on LLE, focus on graded activity, and seated and standing balance/core strength. review I's Y's and T's in prone, thoracic mobility? perform further release and PNF for R>L shoulder blades, MFR around B ilium. continue to work on thoracolumbar rotation ROM and decreasing spasms    PT Home Exercise Plan A day: low back stretch, side-stretch, hamstring stretch, butterfly stretch, hip EXT in prone with knee bent, standing hip ABD/EXT, calf raises. B  day: Chest-stretch, Thoracic extensions over a towel roll, seated rows and seated scapular retraction (pull shoulder blades together like "T's") with band, child's pose stretch, self internal TP release (especially when Pelvic floor spasms high), Every day: Pain diary, Posterior pelvic tilts with deep core activation and red band for obliques activation, bow-and-arrow, TENS unit 3+ times per day for 30 min and as needed if pain starts to increase. TENS at ASIS/PSIS at strong tingle for 1 hr. per day. Sleeper stretch PRN, vagal nerve toning    Consulted and Agree with Plan of Care Patient           Patient will benefit from skilled therapeutic intervention in order to improve the following deficits and impairments:     Visit Diagnosis: Other muscle spasm  Muscle weakness (generalized)  Difficulty in walking, not elsewhere classified     Problem List Patient Active Problem List   Diagnosis Date Noted  . Seizure ([place]) 11/11/2018  . Major depressive disorder, recurrent episode, moderate ([place]) 02/07/2018  . Nephrolithiasis 04/16/2016  . Numbness 07/28/2015  . Bladder retention 06/23/2015  . Abdominal pain 06/04/2015  . Dizziness 05/18/2015  . Neck pain 05/18/2015  . Complicated migraine 37/36/6815  . Other fatigue 04/28/2015  . Abnormal finding on MRI of brain 04/28/2015  . D (diarrhea) 03/29/2015  . H/O disease 03/29/2015  . Abnormal weight loss 03/29/2015  . Muscle weakness (generalized) 03/14/2015  . Headache, migraine 03/10/2015   Willa Rough DPT, ATC Willa Rough 08/05/2020, 9:55 PM  [place] [facility] SERVICES [address] [place], [place], [address] Phone: [phone]   Fax:  [phone]  Name: Christine Chavez MRN: [id number] Date of Birth: [birth year]

## 2020-08-09 ENCOUNTER — Other Ambulatory Visit: Payer: Self-pay

## 2020-08-09 ENCOUNTER — Ambulatory Visit: Payer: BC Managed Care – PPO

## 2020-08-09 DIAGNOSIS — M62838 Other muscle spasm: Secondary | ICD-10-CM

## 2020-08-09 DIAGNOSIS — M6281 Muscle weakness (generalized): Secondary | ICD-10-CM

## 2020-08-09 DIAGNOSIS — R262 Difficulty in walking, not elsewhere classified: Secondary | ICD-10-CM

## 2020-08-09 NOTE — Therapy (Signed)
[place] MAIN [facility] SERVICES [address] [place], [place], [address] Phone: [phone]   Fax:  [phone]  Physical Therapy Treatment /Physical Therapy Progress Note   Dates of reporting period  06/28/20   to   08/09/20  Patient Details  Name: Christine Chavez MRN: [id number] Date of Birth: 1981/03/26 No data recorded  Encounter Date: 08/09/2020   PT End of Session - 08/09/20 1046    Visit Number 100    Number of Visits 114    Date for PT Re-Evaluation 09/20/20    Authorization Type [employer]; 5 session balance PT    Authorization Time Period 06/28/20-09/20/20    Authorization - Visit Number 60    Authorization - Number of Visits 20    Progress Note Due on Visit 98    PT Start Time 1030    PT Stop Time 1114    PT Time Calculation (min) 44 min    Activity Tolerance Patient tolerated treatment well;No increased pain    Behavior During Therapy WFL for tasks assessed/performed           Past Medical History:  Diagnosis Date  . Complication of anesthesia    ? seizures after anesthesia   . Headache   . Migraines   . Neurogenic bladder   . Renal disorder   . Vision abnormalities     Past Surgical History:  Procedure Laterality Date  . ANTERIOR CRUCIATE LIGAMENT REPAIR  1997  . APPENDECTOMY    . COLONOSCOPY WITH PROPOFOL N/A 11/11/2018   Procedure: COLONOSCOPY WITH PROPOFOL;  Surgeon: [name], MD;  Location: [facility] ENDOSCOPY;  Service: Endoscopy;  Laterality: N/A;  . CYSTOSCOPY WITH STENT PLACEMENT Right 04/17/2016   Procedure: CYSTOSCOPY WITH STENT PLACEMENT;  Surgeon: [name], MD;  Location: [facility] ORS;  Service: Urology;  Laterality: Right;  . ESOPHAGOGASTRODUODENOSCOPY (EGD) WITH PROPOFOL N/A 11/11/2018   Procedure: ESOPHAGOGASTRODUODENOSCOPY (EGD) WITH PROPOFOL;  Surgeon: [name], MD;  Location: [facility] ENDOSCOPY;  Service: Endoscopy;  Laterality: N/A;  . EXPLORATORY LAPAROTOMY  1999  . KIDNEY STONE SURGERY   04/2016  . REVISION UROSTOMY CUTANEOUS    . REVISION UROSTOMY CUTANEOUS  01/10/2018  . SUPRAPUBIC CATHETER PLACEMENT  08/2017  . TONSILLECTOMY      There were no vitals filed for this visit.   Subjective Assessment - 08/09/20 1045    Subjective Patient reports her pain has been overwhelming. Had to take a vaginal pain control pill. Will go to urogynocology May 3rd to talk about interstem.    Limitations Lifting;Standing;Walking;House hold activities;Other (comment)    Patient Stated Goals to regain PLOF    Currently in Pain? Yes    Pain Score 8     Pain Location Back    Pain Orientation Lower    Pain Descriptors / Indicators Aching    Pain Type Chronic pain    Pain Onset More than a month ago    Pain Frequency Constant           Patient reports her pain has been overwhelming. Had to take a vaginal pain control pill. Will go to urogynocology May 3rd to talk about interstem.    Goals: VAS: 8/10  ADL performance with pain level ; requires multiple stops along the process. More fatigue than pain.  PFDI Pain during intercourse: some days.  Able to go 2-3 hours between empty bladder: on a good day: yes on a bad day: every hour  BM's: : yes but  sometimes has to use meds. 50% of the time is normal.  Drive 1 hour of light housework: able to do but is very fatiguing Attend athletic event: not yet because they have not been playing.    Treatment:  In // bars: Forward/backwards walking 4x length of // bars stabilization to bilateral limbs for improved foot clearance and weight shift.  modfiied single limb stance 2x30 seconds with stabilization to leg provided.   Hip flexion into GTB across bars with BUE support, stabilization to affected limb required 10x each LE Lateral step over GTB on ground 10x each LE, stabilization provided to limb for weight acceptance 10x each LE     Patient's condition has the potential to improve in response to therapy. Maximum improvement is yet to be  obtained. The anticipated improvement is attainable and reasonable in a generally predictable time.  Patient reports she is getting better but is not where she wants to be yet.   Patient's goals are performed today. Her pain is similar to last testing date due to medical condition. Will go to urogynocology May 3rd to talk about interstem placement potential. Bilateral LE trembling increases with fatigue requiring stabilization with weightbearing from external source. Patient's condition has the potential to improve in response to therapy. Maximum improvement is yet to be obtained. The anticipated improvement is attainable and reasonable in a generally predictable time.  Patient will benefit from skilled physical therapy to increase strength, stability, and decrease fall risk for improved quality of life                       PT Education - 08/09/20 1046    Education Details exercise technique, body mechanics    Person(s) Educated Patient    Methods Explanation;Demonstration;Tactile cues;Verbal cues    Comprehension Verbalized understanding;Returned demonstration;Verbal cues required;Tactile cues required            PT Short Term Goals - 08/09/20 1229      PT SHORT TERM GOAL #1   Title Patient will demonstrate coordinated diaphragmatic breathing with pelvic tilts to demonstrate improved control of diaphragm and TA, to allow for further strengthening of core musculature and decreased pelvic floor spasm.    Baseline Pt. demonstrates breathing dysfunction and poor PFM coordination evidenced by anal manometry As of 2/22: Pt. continues to have restriction in her diaphragm and near T/L junction but is able to intentionally use diaphragmatic breathing through the available ROM.    Time 5    Period Weeks    Status Achieved    Target Date 04/20/19      PT SHORT TERM GOAL #2   Title Patient will report a reduction in pain to no greater than 6/10 over the prior week to demonstrate  symptom improvement.    Baseline Pain is 10/10 at worst, 2/10 at best As of 2/22: Pt. had achieved this goal for 1 week as of 1/21 but may have infection or other insult that caused pain to increase again. As of 4/29: Pain high of 8/10 but has been over-doing her activity over the past week. As of 6/10: Pt. was able to keep pain at 6 or below since prior visit by using TENS to help keep tension lower but has not had a full week to test if it will keep working. As of 7/6: Pt. had met this goal but has a new UTI and it caused pain to spike to 8/10 over the weekend. As of 11/9: Pt. has  just finally made progress with long-standing UTI and new POC for treatment so she is still having pain spikes up to 8/10 but we expect greater improvement over the next few months. As of 12/30: still feels like she is trending in a better direction but over-did it over the holidays and pain spiked to 8/10. No higher than 6/10 over last 2 days, even with PMS cramping. 3/14: 8/10 pain from bladder spasm 4/25: 8/10 due to bladder/uterine irritation    Time 5    Period Weeks    Status On-going    Target Date 08/02/20      PT SHORT TERM GOAL #3   Title Patient will demonstrate HEP x1 in the clinic to demonstrate understanding and proper form to allow for further improvement.    Baseline Pt. lacks knowledge of therepeutic exercises that can decrease her pain/Sx.    Time 5    Period Weeks    Status Achieved    Target Date 04/20/19      PT SHORT TERM GOAL #4   Title Patient will report consistent use of foot-stool (squatty-potty) for positioning with BM to decrease pain with BM and intra-abdominal pressure.    Baseline Pt. having constipation due to PFM dysfunction    Time 5    Period Weeks    Status Achieved    Target Date 04/20/19             PT Long Term Goals - 08/09/20 0001      PT LONG TERM GOAL #1   Title Pt. will be able to participate in regular ADL's with least restrictive device without pain increasing  greater than 2/10    Baseline Pt. limited in her ability to perform household duties by increased pain and fatigue. As of 4/29: Her legs have more endurance with walking before she gets a tremor. Still needs to sit down the majority of the time she is cooking. Still needs back support for prolonged sitting. As of 6/10: Pt. able to do for  ~1 hour before she needs to rest to prevent increased pain, pain is averaging ~ 4-5/10 3/14: able to perform ADLs with less pain, does still have bad days 4/25: requires stops throughout process more fatigue than pain limiting    Time 12    Period Weeks    Status On-going    Target Date 09/20/20      PT LONG TERM GOAL #3   Title Patient will report no pain with intercourse to demonstrate improved functional ability.    Baseline Pt. Having significant pain with intercourse. As of 4/29: is a little better (20-30%) but is worse on days where overall tension/pain is worse. As of 6/10: can have days where there is not much pain but other days where there is significant pain. As of 11/9: same as previous update. AS of 04/15/20: 25% improved from base-line 3/14: to be asssessed next session by pelvic therapist 4/25: some pain iwth intercouse but not always    Time 12    Period Weeks    Status Partially Met    Target Date 09/20/20      PT LONG TERM GOAL #5   Title Pt. will be able to go 2-3 hours between emptying her bladder without increased pain and empty her bladder fully whether by urostomy, cath, or voluntary release.    Baseline Pt. has a urostomy but continues to have to self-catheterize. Has pain with bladder filling and when using catheter. As of 4/29:  is doing better since starting to take the pregabalin andd baclofen but occasionally still has pain with bladder filling. It is bad when she has an infection or over-does activity, better on "normal" days which have been few and far between due to frequent UTI's. As of 6/10: Pt. has "good" and "bad" days but has  more days where she can go 2-3 hours without increased pain. As of 04/15/20: able to wait 2 hours, not 3 yet 3/14: somedays able to meet this goals, some days is more frequent 4/25: good day: yes bad day every hour    Time 12    Period Weeks    Status Partially Met    Target Date 11/01/20      PT LONG TERM GOAL #6   Title Patient will report having BM's at least every-other day with consistency between [facility] stool scale 3-5 over the prior week to demonstrate decreased constipation.    Baseline Pt. unable to have regula BM's without medication, manometry shows PFM dysfunction. As of 4/29: still having to use and enema or supository occasionally and still needing linzess. As of 6/10: Pt. has days where she can empty ok and days where she needs to use an Enema to stimulate a BM, feels that it is just the insertion of the enema, not the solution that does the job. As of 11/9: Pt. not having constipation due to colonic infection that has shifted her toward diarrhea. Having BM's daily, sometimes type 5. Has had diarrhea more than constipation, still had to take linzess 1 time to go after 2 days without a BM. 3/14: to be assessed by pelvic therapist 4/25: yes but 50% of the time is normal consistancy    Time 12    Period Weeks    Status On-going    Target Date 09/20/20      PT LONG TERM GOAL #7   Title Pt. will drive to a meaningful activity to demonstrate increased function and participation    Baseline Pt. has not been able to drive due to decreased LE control 3/134: still not driving at this time    Time 12    Period Weeks    Status On-going    Target Date 09/20/20      PT LONG TERM GOAL #8   Title Pt. will be able to perform 1 hr. of light housework reqiring intermittent standing and walking without increased pain/fatigue >2 pts.    Baseline Pt. becomes fatigued washing fruits/vegetbles in small stages and has fatigue>3 pts. 3/14: able to perform but needs rest breaks 4/25: able to do but is  fatiguing    Time 12    Period Weeks    Status Partially Met    Target Date 09/20/20      PT LONG TERM GOAL  #9   TITLE Pt. will be able to attend a full athletic event for her children without Sx increasing by more than 2 pts.    Baseline Pt. having migraine or severe bladder spasms and fatigue and back pain> 3 pt. increase with attendance at athletic events 3/14: has increased pain but is able to attend now, has to use TENS unit 4/25: has not had any games to be able to attempt to go to    Time 12    Period Weeks    Status Partially Met    Target Date 09/20/20                 Plan - 08/09/20  1248    Clinical Impression Statement Patient's goals are performed today. Her pain is similar to last testing date due to medical condition. Will go to urogynocology May 3rd to talk about interstem placement potential. Bilateral LE trembling increases with fatigue requiring stabilization with weightbearing from external source. Patient's condition has the potential to improve in response to therapy. Maximum improvement is yet to be obtained. The anticipated improvement is attainable and reasonable in a generally predictable time.  Patient will benefit from skilled physical therapy to increase strength, stability, and decrease fall risk for improved quality of life    Personal Factors and Comorbidities Comorbidity 3+    Comorbidities neurologic insult with pending diagnosis, multiple recurrent UTI's, BLE weakness, depression    Examination-Activity Limitations Caring for Others;Carry;Continence;Squat;Stairs;Toileting;Locomotion Level;Lift;Stand    Examination-Participation Restrictions Church;Cleaning;Community Activity;Driving;Interpersonal Relationship;Laundry;Yard Work;Meal Prep    Stability/Clinical Decision Making Unstable/Unpredictable    Rehab Potential Fair    PT Frequency 2x / week    PT Duration 12 weeks    PT Treatment/Interventions ADLs/Self Care Home Management;Aquatic  Therapy;Biofeedback;Moist Heat;Electrical Stimulation;Cryotherapy;Traction;Ultrasound;Therapeutic activities;Functional mobility training;Stair training;Gait training;Therapeutic exercise;Balance training;Neuromuscular re-education;Patient/family education;Manual techniques;Dry needling;Scar mobilization;Energy conservation;Taping;Joint Manipulations;Spinal Manipulations;Visual/perceptual remediation/compensation;Passive range of motion    PT Next Visit Plan use balance game for 5 min., ask about stationary bike success/fatigue levels. Ask about taping/re-apply, further suprapubic scar work, deep-core, scapular tracking/strength, and balance, MFR to lower abdomen, Review pain diary, cupping to medial thighs and upper back, re-assess neural glides on LLE, focus on graded activity, and seated and standing balance/core strength. review I's Y's and T's in prone, thoracic mobility? perform further release and PNF for R>L shoulder blades, MFR around B ilium. continue to work on thoracolumbar rotation ROM and decreasing spasms    PT Home Exercise Plan A day: low back stretch, side-stretch, hamstring stretch, butterfly stretch, hip EXT in prone with knee bent, standing hip ABD/EXT, calf raises. B day: Chest-stretch, Thoracic extensions over a towel roll, seated rows and seated scapular retraction (pull shoulder blades together like "T's") with band, child's pose stretch, self internal TP release (especially when Pelvic floor spasms high), Every day: Pain diary, Posterior pelvic tilts with deep core activation and red band for obliques activation, bow-and-arrow, TENS unit 3+ times per day for 30 min and as needed if pain starts to increase. TENS at ASIS/PSIS at strong tingle for 1 hr. per day. Sleeper stretch PRN, vagal nerve toning    Consulted and Agree with Plan of Care Patient           Patient will benefit from skilled therapeutic intervention in order to improve the following deficits and impairments:   Abnormal gait,Decreased balance,Decreased endurance,Decreased mobility,Difficulty walking,Increased muscle spasms,Impaired sensation,Improper body mechanics,Impaired tone,Decreased activity tolerance,Decreased coordination,Decreased strength,Postural dysfunction,Pain  Visit Diagnosis: Other muscle spasm  Muscle weakness (generalized)  Difficulty in walking, not elsewhere classified     Problem List Patient Active Problem List   Diagnosis Date Noted  . Seizure ([place]) 11/11/2018  . Major depressive disorder, recurrent episode, moderate ([place]) 02/07/2018  . Nephrolithiasis 04/16/2016  . Numbness 07/28/2015  . Bladder retention 06/23/2015  . Abdominal pain 06/04/2015  . Dizziness 05/18/2015  . Neck pain 05/18/2015  . Complicated migraine 73/53/2992  . Other fatigue 04/28/2015  . Abnormal finding on MRI of brain 04/28/2015  . D (diarrhea) 03/29/2015  . H/O disease 03/29/2015  . Abnormal weight loss 03/29/2015  . Muscle weakness (generalized) 03/14/2015  . Headache, migraine 03/10/2015   Janna Arch, PT, DPT   08/09/2020, 12:48 PM  [place] MAIN [facility] SERVICES [address] [place], [place], [address] Phone: [phone]   Fax:  [phone]  Name: Christine Chavez MRN: [id number] Date of Birth: 09-Nov-1980

## 2020-08-12 ENCOUNTER — Ambulatory Visit: Payer: BC Managed Care – PPO

## 2020-08-12 ENCOUNTER — Other Ambulatory Visit: Payer: Self-pay

## 2020-08-12 DIAGNOSIS — M62838 Other muscle spasm: Secondary | ICD-10-CM

## 2020-08-12 DIAGNOSIS — M6281 Muscle weakness (generalized): Secondary | ICD-10-CM

## 2020-08-12 DIAGNOSIS — R262 Difficulty in walking, not elsewhere classified: Secondary | ICD-10-CM

## 2020-08-12 NOTE — Therapy (Signed)
[facility] MAIN [facility] SERVICES [address] [place], [place], [address] Phone: [phone]   Fax:  [phone]  Physical Therapy Treatment  The patient has been informed of current processes in place at Outpatient Rehab to protect patients from Covid-19 exposure including social distancing, schedule modifications, and new cleaning procedures. After discussing their particular risk with a therapist based on the patient's personal risk factors, the patient has decided to proceed with in-person therapy.  Patient Details  Name: Christine Chavez MRN: [id number] Date of Birth: 26-Aug-1980 No data recorded  Encounter Date: 08/12/2020   PT End of Session - 08/12/20 1024    Visit Number 101    Number of Visits 114    Date for PT Re-Evaluation 09/20/20    Authorization Type [employer]; 5 session balance PT    Authorization Time Period 06/28/20-09/20/20    Authorization - Visit Number 1    Authorization - Number of Visits 24    Progress Note Due on Visit 110    PT Start Time 0945    PT Stop Time 1040    PT Time Calculation (min) 55 min    Activity Tolerance Patient tolerated treatment well;No increased pain    Behavior During Therapy WFL for tasks assessed/performed           Past Medical History:  Diagnosis Date  . Complication of anesthesia    ? seizures after anesthesia   . Headache   . Migraines   . Neurogenic bladder   . Renal disorder   . Vision abnormalities     Past Surgical History:  Procedure Laterality Date  . ANTERIOR CRUCIATE LIGAMENT REPAIR  1997  . APPENDECTOMY    . COLONOSCOPY WITH PROPOFOL N/A 11/11/2018   Procedure: COLONOSCOPY WITH PROPOFOL;  Surgeon: Christena Deem, MD;  Location: [facility] ENDOSCOPY;  Service: Endoscopy;  Laterality: N/A;  . CYSTOSCOPY WITH STENT PLACEMENT Right 04/17/2016   Procedure: CYSTOSCOPY WITH STENT PLACEMENT;  Surgeon: Malen Gauze, MD;  Location: [facility] ORS;  Service: Urology;  Laterality: Right;   . ESOPHAGOGASTRODUODENOSCOPY (EGD) WITH PROPOFOL N/A 11/11/2018   Procedure: ESOPHAGOGASTRODUODENOSCOPY (EGD) WITH PROPOFOL;  Surgeon: Christena Deem, MD;  Location: [facility] ENDOSCOPY;  Service: Endoscopy;  Laterality: N/A;  . EXPLORATORY LAPAROTOMY  1999  . KIDNEY STONE SURGERY  04/2016  . REVISION UROSTOMY CUTANEOUS    . REVISION UROSTOMY CUTANEOUS  01/10/2018  . SUPRAPUBIC CATHETER PLACEMENT  08/2017  . TONSILLECTOMY      There were no vitals filed for this visit.  Pelvic Floor Physical Therapy Treatment Note  SCREENING  Changes in medications, allergies, or medical history?: she is still interested in trying the interstim.   SUBJECTIVE  Patient reports: She has had a bad migraine on Tuesday and Wednesday. She is feeling a lot of indigestion. She has not used a suppository since Monday. She is just trying to "hang tight" until she han see Cassandra.    Precautions:  Neurological diagnosis pending, has "non-specific" brain lesions   Pain update:  Location of pain: LB and neck/shoulders (bladder) Current pain:  5/10) (5/10) Max pain: 7/10 (8/10 when it spasms) Least pain:  3/10 (3/10) Nature of pain: sharp to dull ache (grabbing)  **   Patient Goals: Get rid of the discomfort with catheterizing and ache/bladder pain. Be able to take fewer medications and be able to enjoy spending time with her family again, doing "all the mom things"    OBJECTIVE  Changes in:  Observations/Posture:  L lumbar curve prominent with transverse processes more prominent on the L. (from previous note)  C3 spinous process deviated L through C2-3. (from previous note)  12/30: L up-slip and anterior rotation **following treatment L iliacus remained tight, not allowing for complete up-slip or rotation correction.  07/01/20: PSIS appear level in prone, L5 spinous process prominent.  07/26/20: L up-slip and R anterior rotation.   Range of Motion/Flexibilty:  (from 03/27/19) Spine: ~ 2-3  fingers from knee with SB B. ~ 50% reduced R rotation with stretch in L, ~ 75% reduced L rotation with increased tightness. HS: able to achieve 90 deg. Hip flexion in seated HS stretch. (from prior treatment)  Decreased PIVM through Thoracic, lumbar, and sacral spine with greatest restriction at T3-6. (from prior session)  12/30:  no decreased mobility at the sacrum with attempt at manual correction for L anterior rotation, hip-flexor spasm keeping Pt. From approp  07/01/20: Decreased scar mobility in the suprapubic region.   07/08/20: Pt. Demonstrated decreased ROM in R hip IR/ER compared to L with empty end-feel. **following treatment Pt. Able to demonstrate normal ROM without pain for IR/ER and no pain with aggravating motion of getting in/out of w/c or car.  07/22/20: decreased scar mobility in supra-pubic region in cephalic Directon. Decreased L to R cervical PIVM mobility.  08/05/20: decreased scar mobility in supra-pubic region in cephalic Directon.  TODAY: R anterior rotation.  Strength/MMT:  LE MMT (from 03-27-19) LE MMT Left Right  Hip flex:  (L2) /5 /5  Hip ext: 3+/5 3+/5  Hip abd: 4/5 4/5  Hip add: 4/5 3+/5  Hip IR 4/5 3+/5  Hip ER 4/5 4/5   (From 3/24): Pt. Demonstrates 3/5 strength with "I's, Y's, and T's" but 4/5 with W's. (from 6/22) DF: 9 reps with B. Eversion/Inversion: 4/5 on R, 4+/5 on L. DF: L 4+/5, R 3+/5  Seated IR/ER on L: 4/5, R 3+/5 (from 8-5) L DF and Eversion 4+/5 with first 1-3 reps and fatigue decreased to 4/4. (9/9) Pt. Demonstrates ~ 4+/5 B with increased spasticity B.   Balance/stability:  (6-29) Pt. Able to achieve good posture without cueing but only able to maintain for ~ 2.5 min. Before seeking stability from the table and forward rolled shoulders. With cue to engage the deep core, Pt. Recognizes increased stability and comfort.  8/5: Pt. Demonstrating decreased stability in gait and standing balance today with fasciculations increasing in direct  relation to muscular endurance required. Able to stand ~ 10 sec. In SLS with MIN UE support before intensity of spasms make attempt at balance futile.   12/30: Pt. Reports feeling less stable on LLE again, similar to August but less intense.   1/31: Able to stand for ~ 10 sec. In double leg stance and on RLE with no LOB the first attempt but required TC and VC for L glute med. Activation for LLE. Second attempt less cueing recuired for LLE but far more fasiculations noted. Was able to stand with slow perterbations in multiple directions applied to the shoulders for ~ 20 sec. Before fatigue slowed reaction time.  2/17: performed circle maze and square maze on balance machine with BUE support. Circle: 134 sec. -4 pts. Square: 137 sec. And -17 pts.   Pelvic Floor External Exam: (From prior session)- [Introitus Appears: elevated Skin integrity: WNL Palpation: TTP to B STP Cough: limited motion Prolapse visible?: no Scar mobility: not assessed  Internal Vaginal Exam: Strength (PERF): Pt. Has difficulty sensing the PFM to generate  contraction or relaxation due to high tone/neuro involvement Symmetry: L>R for spasm/TTP Palpation: TTP to all muscles throughout B Prolapse: none]  unable to coordinate squeeze and TTP to all areas with the greatest at B OI and coccygeus. ** following TP release Pt. Able to achieve 1/5 PFM contraction. Decreased TTP at B anterior PR/PC following treatment. (10/14).  TODAY: not re-assessed today.  Abdominal:  Pt. Demonstrated ability to recruit R>L obliques and TA with pelvic tilts. (from prior session)  Palpation: Decreased fascial and scar mobility through lower abdomen with radiating Sx. To the pelvis and LB. (from prior session)  12/30: TTP to R Piriformis and OI, L QL and Iliacus  Murphy's punch sign: Some sharp pain that shot forward with percussion to B kidneys. (from 06/03/20)   07/01/20: TTP to B erector spinae and multifidus from L3-5  07/08/20:  TTP to R adductor brevis and L iliacus and psoas.   07/22/20: TTP to L cervical extensors, B sub-occipitals   07/26/20: TTP to R>L adductors.  08/05/20: TTP to B iliacus   TODAY: TTP to B adductor brevis and B multifidus from T11-L4.  Gait Analysis:  (04/22/20) 2 min. Walk test: 12 meters on 1st attempt. 7.75 meters on 2nd attempt. (video)  Pt. Demonstrated crouched walk with difficulty controlling where she placed the R>L LE and ~ 3-4 near LOB while walking on 2nd attempt with PT at [facility] and using Rollator  (Pt. Feels like this would have been better 2-3 days ago even)  2/3: Pt. Walked ~ 50 ft. x2 With rollator and gait belt. Attempted to walk to metronome cadence but with difficulty, ended up keeping to ~ 1/2 time of the tempo with ~ 50% consistency. Some difficulty with control noted but less than during initial walk test.  INTERVENTIONS THIS SESSION: Manual: Performed TP release and STM to R Piriformis and OI as well as to B lumbar and thoracic multifidus from T11-L4 followed by grade 3-4 PA mobs to R sacral apex and AP mobs to R ASIS then MET correction x2 and MWM to correct for R anterior rotation and decrease spasm and pain as well as improve nerve conductivity by decreasing mal-alignment of the pelvis.  Dry-needle: Performed TPDN with four .30x66mm needles and standard approach as described below in conjunction with E-stim to decrease spasm and pain and allow for improved balance of musculature for improved function and decreased symptoms.  E-stim: Performed attended E-stim in conjunction with TPDN as described below in needling comments to decrease spasm and pain/dysfunction.  Total time: 60 min.                                PT Short Term Goals - 08/09/20 1229      PT SHORT TERM GOAL #1   Title Patient will demonstrate coordinated diaphragmatic breathing with pelvic tilts to demonstrate improved control of diaphragm and TA, to allow for further  strengthening of core musculature and decreased pelvic floor spasm.    Baseline Pt. demonstrates breathing dysfunction and poor PFM coordination evidenced by anal manometry As of 2/22: Pt. continues to have restriction in her diaphragm and near T/L junction but is able to intentionally use diaphragmatic breathing through the available ROM.    Time 5    Period Weeks    Status Achieved    Target Date 04/20/19      PT SHORT TERM GOAL #2   Title Patient will report a  reduction in pain to no greater than 6/10 over the prior week to demonstrate symptom improvement.    Baseline Pain is 10/10 at worst, 2/10 at best As of 2/22: Pt. had achieved this goal for 1 week as of 1/21 but may have infection or other insult that caused pain to increase again. As of 4/29: Pain high of 8/10 but has been over-doing her activity over the past week. As of 6/10: Pt. was able to keep pain at 6 or below since prior visit by using TENS to help keep tension lower but has not had a full week to test if it will keep working. As of 7/6: Pt. had met this goal but has a new UTI and it caused pain to spike to 8/10 over the weekend. As of 11/9: Pt. has just finally made progress with long-standing UTI and new POC for treatment so she is still having pain spikes up to 8/10 but we expect greater improvement over the next few months. As of 12/30: still feels like she is trending in a better direction but over-did it over the holidays and pain spiked to 8/10. No higher than 6/10 over last 2 days, even with PMS cramping. 3/14: 8/10 pain from bladder spasm 4/25: 8/10 due to bladder/uterine irritation    Time 5    Period Weeks    Status On-going    Target Date 08/02/20      PT SHORT TERM GOAL #3   Title Patient will demonstrate HEP x1 in the clinic to demonstrate understanding and proper form to allow for further improvement.    Baseline Pt. lacks knowledge of therepeutic exercises that can decrease her pain/Sx.    Time 5    Period Weeks     Status Achieved    Target Date 04/20/19      PT SHORT TERM GOAL #4   Title Patient will report consistent use of foot-stool (squatty-potty) for positioning with BM to decrease pain with BM and intra-abdominal pressure.    Baseline Pt. having constipation due to PFM dysfunction    Time 5    Period Weeks    Status Achieved    Target Date 04/20/19             PT Long Term Goals - 08/09/20 0001      PT LONG TERM GOAL #1   Title Pt. will be able to participate in regular ADL's with least restrictive device without pain increasing greater than 2/10    Baseline Pt. limited in her ability to perform household duties by increased pain and fatigue. As of 4/29: Her legs have more endurance with walking before she gets a tremor. Still needs to sit down the majority of the time she is cooking. Still needs back support for prolonged sitting. As of 6/10: Pt. able to do for  ~1 hour before she needs to rest to prevent increased pain, pain is averaging ~ 4-5/10 3/14: able to perform ADLs with less pain, does still have bad days 4/25: requires stops throughout process more fatigue than pain limiting    Time 12    Period Weeks    Status On-going    Target Date 09/20/20      PT LONG TERM GOAL #3   Title Patient will report no pain with intercourse to demonstrate improved functional ability.    Baseline Pt. Having significant pain with intercourse. As of 4/29: is a little better (20-30%) but is worse on days where overall tension/pain is worse.  As of 6/10: can have days where there is not much pain but other days where there is significant pain. As of 11/9: same as previous update. AS of 04/15/20: 25% improved from base-line 3/14: to be asssessed next session by pelvic therapist 4/25: some pain iwth intercouse but not always    Time 12    Period Weeks    Status Partially Met    Target Date 09/20/20      PT LONG TERM GOAL #5   Title Pt. will be able to go 2-3 hours between emptying her bladder  without increased pain and empty her bladder fully whether by urostomy, cath, or voluntary release.    Baseline Pt. has a urostomy but continues to have to self-catheterize. Has pain with bladder filling and when using catheter. As of 4/29: is doing better since starting to take the pregabalin andd baclofen but occasionally still has pain with bladder filling. It is bad when she has an infection or over-does activity, better on "normal" days which have been few and far between due to frequent UTI's. As of 6/10: Pt. has "good" and "bad" days but has more days where she can go 2-3 hours without increased pain. As of 04/15/20: able to wait 2 hours, not 3 yet 3/14: somedays able to meet this goals, some days is more frequent 4/25: good day: yes bad day every hour    Time 12    Period Weeks    Status Partially Met    Target Date 11/01/20      PT LONG TERM GOAL #6   Title Patient will report having BM's at least every-other day with consistency between [facility] stool scale 3-5 over the prior week to demonstrate decreased constipation.    Baseline Pt. unable to have regula BM's without medication, manometry shows PFM dysfunction. As of 4/29: still having to use and enema or supository occasionally and still needing linzess. As of 6/10: Pt. has days where she can empty ok and days where she needs to use an Enema to stimulate a BM, feels that it is just the insertion of the enema, not the solution that does the job. As of 11/9: Pt. not having constipation due to colonic infection that has shifted her toward diarrhea. Having BM's daily, sometimes type 5. Has had diarrhea more than constipation, still had to take linzess 1 time to go after 2 days without a BM. 3/14: to be assessed by pelvic therapist 4/25: yes but 50% of the time is normal consistancy    Time 12    Period Weeks    Status On-going    Target Date 09/20/20      PT LONG TERM GOAL #7   Title Pt. will drive to a meaningful activity to demonstrate  increased function and participation    Baseline Pt. has not been able to drive due to decreased LE control 3/134: still not driving at this time    Time 12    Period Weeks    Status On-going    Target Date 09/20/20      PT LONG TERM GOAL #8   Title Pt. will be able to perform 1 hr. of light housework reqiring intermittent standing and walking without increased pain/fatigue >2 pts.    Baseline Pt. becomes fatigued washing fruits/vegetbles in small stages and has fatigue>3 pts. 3/14: able to perform but needs rest breaks 4/25: able to do but is fatiguing    Time 12    Period Weeks  Status Partially Met    Target Date 09/20/20      PT LONG TERM GOAL  #9   TITLE Pt. will be able to attend a full athletic event for her children without Sx increasing by more than 2 pts.    Baseline Pt. having migraine or severe bladder spasms and fatigue and back pain> 3 pt. increase with attendance at athletic events 3/14: has increased pain but is able to attend now, has to use TENS unit 4/25: has not had any games to be able to attempt to go to    Time 12    Period Weeks    Status Partially Met    Target Date 09/20/20                 Plan - 08/12/20 1028    Clinical Impression Statement Pt. Responded well to all interventions today, demonstrating decreased spasm and ~50% improved pelvic alignment, as well as understanding and correct performance of all education and exercises provided today. We will re-address the proximal adductors and re-try MET correction at next visit to achieve further improvement in pelvic alignment. They will continue to benefit from skilled physical therapy to work toward remaining goals and maximize function as well as decrease likelihood of symptom increase or recurrence.    PT Next Visit Plan use balance game for 5 min., ask about stationary bike success/fatigue levels. Ask about taping/re-apply, further suprapubic scar work, deep-core, scapular tracking/strength, and  balance, MFR to lower abdomen, Review pain diary, cupping to medial thighs and upper back, re-assess neural glides on LLE, focus on graded activity, and seated and standing balance/core strength. review I's Y's and T's in prone, thoracic mobility? perform further release and PNF for R>L shoulder blades, MFR around B ilium. continue to work on thoracolumbar rotation ROM and decreasing spasms    PT Home Exercise Plan A day: low back stretch, side-stretch, hamstring stretch, butterfly stretch, hip EXT in prone with knee bent, standing hip ABD/EXT, calf raises. B day: Chest-stretch, Thoracic extensions over a towel roll, seated rows and seated scapular retraction (pull shoulder blades together like "T's") with band, child's pose stretch, self internal TP release (especially when Pelvic floor spasms high), Every day: Pain diary, Posterior pelvic tilts with deep core activation and red band for obliques activation, bow-and-arrow, TENS unit 3+ times per day for 30 min and as needed if pain starts to increase. TENS at ASIS/PSIS at strong tingle for 1 hr. per day. Sleeper stretch PRN, vagal nerve toning    Consulted and Agree with Plan of Care Patient           Patient will benefit from skilled therapeutic intervention in order to improve the following deficits and impairments:     Visit Diagnosis: Other muscle spasm  Muscle weakness (generalized)  Difficulty in walking, not elsewhere classified     Problem List Patient Active Problem List   Diagnosis Date Noted  . Seizure ([place]) 11/11/2018  . Major depressive disorder, recurrent episode, moderate ([place]) 02/07/2018  . Nephrolithiasis 04/16/2016  . Numbness 07/28/2015  . Bladder retention 06/23/2015  . Abdominal pain 06/04/2015  . Dizziness 05/18/2015  . Neck pain 05/18/2015  . Complicated migraine 67/[id number]  . Other fatigue 04/28/2015  . Abnormal finding on MRI of brain 04/28/2015  . D (diarrhea) 03/29/2015  . H/O disease 03/29/2015  .  Abnormal weight loss 03/29/2015  . Muscle weakness (generalized) 03/14/2015  . Headache, migraine 03/10/2015   Willa Rough DPT, ATC [place]  Angeline Slim 08/13/2020, 12:12 PM  [place] MAIN [facility] SERVICES [address] [place], [place], [address] Phone: [phone]   Fax:  [phone]  Name: Christine Chavez MRN: [id number] Date of Birth: 05/10/1980

## 2020-08-16 ENCOUNTER — Other Ambulatory Visit: Payer: Self-pay

## 2020-08-16 ENCOUNTER — Ambulatory Visit: Payer: BC Managed Care – PPO | Attending: Gastroenterology

## 2020-08-16 DIAGNOSIS — R262 Difficulty in walking, not elsewhere classified: Secondary | ICD-10-CM | POA: Insufficient documentation

## 2020-08-16 DIAGNOSIS — M62838 Other muscle spasm: Secondary | ICD-10-CM

## 2020-08-16 DIAGNOSIS — M6281 Muscle weakness (generalized): Secondary | ICD-10-CM

## 2020-08-16 NOTE — Therapy (Signed)
[place] MAIN [facility] SERVICES [address] [place], [place], [address] Phone: [phone]   Fax:  [phone]  Physical Therapy Treatment The patient has been informed of current processes in place at Outpatient Rehab to protect patients from Covid-19 exposure including social distancing, schedule modifications, and new cleaning procedures. After discussing their particular risk with a therapist based on the patient's personal risk factors, the patient has decided to proceed with in-person therapy.  Patient Details  Name: Christine Chavez MRN: [id number] Date of Birth: 1980/10/09 No data recorded  Encounter Date: 08/16/2020   PT End of Session - 08/16/20 1026    Visit Number 102    Number of Visits 114    Date for PT Re-Evaluation 09/20/20    Authorization Type [employer]; 5 session balance PT    Authorization Time Period 06/28/20-09/20/20    Authorization - Visit Number 2    Authorization - Number of Visits 24    Progress Note Due on Visit 110    PT Start Time 0916    PT Stop Time 1016    PT Time Calculation (min) 60 min    Activity Tolerance Patient tolerated treatment well;No increased pain    Behavior During Therapy WFL for tasks assessed/performed           Past Medical History:  Diagnosis Date  . Complication of anesthesia    ? seizures after anesthesia   . Headache   . Migraines   . Neurogenic bladder   . Renal disorder   . Vision abnormalities     Past Surgical History:  Procedure Laterality Date  . ANTERIOR CRUCIATE LIGAMENT REPAIR  1997  . APPENDECTOMY    . COLONOSCOPY WITH PROPOFOL N/A 11/11/2018   Procedure: COLONOSCOPY WITH PROPOFOL;  Surgeon: [name], MD;  Location: [facility] ENDOSCOPY;  Service: Endoscopy;  Laterality: N/A;  . CYSTOSCOPY WITH STENT PLACEMENT Right 04/17/2016   Procedure: CYSTOSCOPY WITH STENT PLACEMENT;  Surgeon: [name], MD;  Location: [facility] ORS;  Service: Urology;  Laterality: Right;  .  ESOPHAGOGASTRODUODENOSCOPY (EGD) WITH PROPOFOL N/A 11/11/2018   Procedure: ESOPHAGOGASTRODUODENOSCOPY (EGD) WITH PROPOFOL;  Surgeon: [name], MD;  Location: [facility] ENDOSCOPY;  Service: Endoscopy;  Laterality: N/A;  . EXPLORATORY LAPAROTOMY  1999  . KIDNEY STONE SURGERY  04/2016  . REVISION UROSTOMY CUTANEOUS    . REVISION UROSTOMY CUTANEOUS  01/10/2018  . SUPRAPUBIC CATHETER PLACEMENT  08/2017  . TONSILLECTOMY      There were no vitals filed for this visit.  Pelvic Floor Physical Therapy Treatment Note  SCREENING  Changes in medications, allergies, or medical history?: she is still interested in trying the interstim.   SUBJECTIVE  Patient reports:  She was achy following the last treatment but then she started her period on Friday. It was very rough over the weekend and she was very busy and trying to go to church/participate. She had to take a migraine med and her other normal PRN meds but it was still rough. She is working on a bunch of Mother's day baskets and may have over-done it. She has had spasms this morning but has not taken anything for them yet. The back is really bothering her today both up-top and middle. She has just a mild headache leftover from yesterday but nothing major.   Precautions:  Neurological diagnosis pending, has "non-specific" brain lesions   Pain update:  Location of pain: LB and neck/shoulders (bladder) Current pain:  6/10) (6/10) Max pain: 8/10 (  8/10 when it spasms) Least pain:  5/10 (5/10) Nature of pain: sharp to dull ache (grabbing)  ** 4.5/10 following treatment.  Patient Goals: Get rid of the discomfort with catheterizing and ache/bladder pain. Be able to take fewer medications and be able to enjoy spending time with her family again, doing "all the mom things"    OBJECTIVE  Changes in:  Observations/Posture:  L lumbar curve prominent with transverse processes more prominent on the L. (from previous note)  C3 spinous process  deviated L through C2-3. (from previous note)  12/30: L up-slip and anterior rotation **following treatment L iliacus remained tight, not allowing for complete up-slip or rotation correction.  07/01/20: PSIS appear level in prone, L5 spinous process prominent.  07/26/20: L up-slip and R anterior rotation.   08/12/20: R anterior rotation.  Range of Motion/Flexibilty:  (from 03/27/19) Spine: ~ 2-3 fingers from knee with SB B. ~ 50% reduced R rotation with stretch in L, ~ 75% reduced L rotation with increased tightness. HS: able to achieve 90 deg. Hip flexion in seated HS stretch. (from prior treatment)  Decreased PIVM through Thoracic, lumbar, and sacral spine with greatest restriction at T3-6. (from prior session)  12/30:  no decreased mobility at the sacrum with attempt at manual correction for L anterior rotation, hip-flexor spasm keeping Pt. From approp  07/01/20: Decreased scar mobility in the suprapubic region.   07/08/20: Pt. Demonstrated decreased ROM in R hip IR/ER compared to L with empty end-feel. **following treatment Pt. Able to demonstrate normal ROM without pain for IR/ER and no pain with aggravating motion of getting in/out of w/c or car.  07/22/20: decreased scar mobility in supra-pubic region in cephalic Directon. Decreased L to R cervical PIVM mobility.  08/05/20: decreased scar mobility in supra-pubic region in cephalic Directon.  TODAY: decreased mobility through entire thoracic and lumbar spine with greatest restriction at ~ T7-10 and through lumbar curve.  Strength/MMT:  LE MMT (from 03-27-19) LE MMT Left Right  Hip flex:  (L2) /5 /5  Hip ext: 3+/5 3+/5  Hip abd: 4/5 4/5  Hip add: 4/5 3+/5  Hip IR 4/5 3+/5  Hip ER 4/5 4/5   (From 3/24): Pt. Demonstrates 3/5 strength with "I's, Y's, and T's" but 4/5 with W's. (from 6/22) DF: 9 reps with B. Eversion/Inversion: 4/5 on R, 4+/5 on L. DF: L 4+/5, R 3+/5  Seated IR/ER on L: 4/5, R 3+/5 (from 8-5) L DF and Eversion  4+/5 with first 1-3 reps and fatigue decreased to 4/4. (9/9) Pt. Demonstrates ~ 4+/5 B with increased spasticity B.   Balance/stability:  (6-29) Pt. Able to achieve good posture without cueing but only able to maintain for ~ 2.5 min. Before seeking stability from the table and forward rolled shoulders. With cue to engage the deep core, Pt. Recognizes increased stability and comfort.  8/5: Pt. Demonstrating decreased stability in gait and standing balance today with fasciculations increasing in direct relation to muscular endurance required. Able to stand ~ 10 sec. In SLS with MIN UE support before intensity of spasms make attempt at balance futile.   12/30: Pt. Reports feeling less stable on LLE again, similar to August but less intense.   1/31: Able to stand for ~ 10 sec. In double leg stance and on RLE with no LOB the first attempt but required TC and VC for L glute med. Activation for LLE. Second attempt less cueing recuired for LLE but far more fasiculations noted. Was able to stand with  slow perterbations in multiple directions applied to the shoulders for ~ 20 sec. Before fatigue slowed reaction time.  2/17: performed circle maze and square maze on balance machine with BUE support. Circle: 134 sec. -4 pts. Square: 137 sec. And -17 pts.   Pelvic Floor External Exam: (From prior session)- [Introitus Appears: elevated Skin integrity: WNL Palpation: TTP to B STP Cough: limited motion Prolapse visible?: no Scar mobility: not assessed  Internal Vaginal Exam: Strength (PERF): Pt. Has difficulty sensing the PFM to generate contraction or relaxation due to high tone/neuro involvement Symmetry: L>R for spasm/TTP Palpation: TTP to all muscles throughout B Prolapse: none]  unable to coordinate squeeze and TTP to all areas with the greatest at B OI and coccygeus. ** following TP release Pt. Able to achieve 1/5 PFM contraction. Decreased TTP at B anterior PR/PC following treatment.  (10/14).  TODAY: not re-assessed today.  Abdominal:  Pt. Demonstrated ability to recruit R>L obliques and TA with pelvic tilts. (from prior session)  Palpation: Decreased fascial and scar mobility through lower abdomen with radiating Sx. To the pelvis and LB. (from prior session)  12/30: TTP to R Piriformis and OI, L QL and Iliacus  Murphy's punch sign: Some sharp pain that shot forward with percussion to B kidneys. (from 06/03/20)   07/01/20: TTP to B erector spinae and multifidus from L3-5  07/08/20: TTP to R adductor brevis and L iliacus and psoas.   07/22/20: TTP to L cervical extensors, B sub-occipitals   07/26/20: TTP to R>L adductors.  08/05/20: TTP to B iliacus   08/12/20: TTP to B adductor brevis and B multifidus from T11-L4.  TODAY: TTP to B QL and erector spinae and multifidus along entire spine.  Gait Analysis:  (04/22/20) 2 min. Walk test: 12 meters on 1st attempt. 7.75 meters on 2nd attempt. (video)  Pt. Demonstrated crouched walk with difficulty controlling where she placed the R>L LE and ~ 3-4 near LOB while walking on 2nd attempt with PT at [facility] and using Rollator  (Pt. Feels like this would have been better 2-3 days ago even)  2/3: Pt. Walked ~ 50 ft. x2 With rollator and gait belt. Attempted to walk to metronome cadence but with difficulty, ended up keeping to ~ 1/2 time of the tempo with ~ 50% consistency. Some difficulty with control noted but less than during initial walk test.  INTERVENTIONS THIS SESSION: Manual: Performed TP release and STM to B QL and erector spinae and multifidus along entire spine followed by grade 3-4 PA mobs to T3-L5 spinous processes to decrease spasm and pain as well as improve nerve conductivity by decreasing mal-alignment of the pelvis and to improve mobility of joint and surrounding connective tissue and decrease pressure on nerve roots for improved conductivity and function of down-stream tissues.   Total time: 60  min.                                 PT Short Term Goals - 08/09/20 1229      PT SHORT TERM GOAL #1   Title Patient will demonstrate coordinated diaphragmatic breathing with pelvic tilts to demonstrate improved control of diaphragm and TA, to allow for further strengthening of core musculature and decreased pelvic floor spasm.    Baseline Pt. demonstrates breathing dysfunction and poor PFM coordination evidenced by anal manometry As of 2/22: Pt. continues to have restriction in her diaphragm and near T/L junction but is  able to intentionally use diaphragmatic breathing through the available ROM.    Time 5    Period Weeks    Status Achieved    Target Date 04/20/19      PT SHORT TERM GOAL #2   Title Patient will report a reduction in pain to no greater than 6/10 over the prior week to demonstrate symptom improvement.    Baseline Pain is 10/10 at worst, 2/10 at best As of 2/22: Pt. had achieved this goal for 1 week as of 1/21 but may have infection or other insult that caused pain to increase again. As of 4/29: Pain high of 8/10 but has been over-doing her activity over the past week. As of 6/10: Pt. was able to keep pain at 6 or below since prior visit by using TENS to help keep tension lower but has not had a full week to test if it will keep working. As of 7/6: Pt. had met this goal but has a new UTI and it caused pain to spike to 8/10 over the weekend. As of 11/9: Pt. has just finally made progress with long-standing UTI and new POC for treatment so she is still having pain spikes up to 8/10 but we expect greater improvement over the next few months. As of 12/30: still feels like she is trending in a better direction but over-did it over the holidays and pain spiked to 8/10. No higher than 6/10 over last 2 days, even with PMS cramping. 3/14: 8/10 pain from bladder spasm 4/25: 8/10 due to bladder/uterine irritation    Time 5    Period Weeks    Status On-going    Target  Date 08/02/20      PT SHORT TERM GOAL #3   Title Patient will demonstrate HEP x1 in the clinic to demonstrate understanding and proper form to allow for further improvement.    Baseline Pt. lacks knowledge of therepeutic exercises that can decrease her pain/Sx.    Time 5    Period Weeks    Status Achieved    Target Date 04/20/19      PT SHORT TERM GOAL #4   Title Patient will report consistent use of foot-stool (squatty-potty) for positioning with BM to decrease pain with BM and intra-abdominal pressure.    Baseline Pt. having constipation due to PFM dysfunction    Time 5    Period Weeks    Status Achieved    Target Date 04/20/19             PT Long Term Goals - 08/09/20 0001      PT LONG TERM GOAL #1   Title Pt. will be able to participate in regular ADL's with least restrictive device without pain increasing greater than 2/10    Baseline Pt. limited in her ability to perform household duties by increased pain and fatigue. As of 4/29: Her legs have more endurance with walking before she gets a tremor. Still needs to sit down the majority of the time she is cooking. Still needs back support for prolonged sitting. As of 6/10: Pt. able to do for  ~1 hour before she needs to rest to prevent increased pain, pain is averaging ~ 4-5/10 3/14: able to perform ADLs with less pain, does still have bad days 4/25: requires stops throughout process more fatigue than pain limiting    Time 12    Period Weeks    Status On-going    Target Date 09/20/20  PT LONG TERM GOAL #3   Title Patient will report no pain with intercourse to demonstrate improved functional ability.    Baseline Pt. Having significant pain with intercourse. As of 4/29: is a little better (20-30%) but is worse on days where overall tension/pain is worse. As of 6/10: can have days where there is not much pain but other days where there is significant pain. As of 11/9: same as previous update. AS of 04/15/20: 25% improved from  base-line 3/14: to be asssessed next session by pelvic therapist 4/25: some pain iwth intercouse but not always    Time 12    Period Weeks    Status Partially Met    Target Date 09/20/20      PT LONG TERM GOAL #5   Title Pt. will be able to go 2-3 hours between emptying her bladder without increased pain and empty her bladder fully whether by urostomy, cath, or voluntary release.    Baseline Pt. has a urostomy but continues to have to self-catheterize. Has pain with bladder filling and when using catheter. As of 4/29: is doing better since starting to take the pregabalin andd baclofen but occasionally still has pain with bladder filling. It is bad when she has an infection or over-does activity, better on "normal" days which have been few and far between due to frequent UTI's. As of 6/10: Pt. has "good" and "bad" days but has more days where she can go 2-3 hours without increased pain. As of 04/15/20: able to wait 2 hours, not 3 yet 3/14: somedays able to meet this goals, some days is more frequent 4/25: good day: yes bad day every hour    Time 12    Period Weeks    Status Partially Met    Target Date 11/01/20      PT LONG TERM GOAL #6   Title Patient will report having BM's at least every-other day with consistency between [facility] stool scale 3-5 over the prior week to demonstrate decreased constipation.    Baseline Pt. unable to have regula BM's without medication, manometry shows PFM dysfunction. As of 4/29: still having to use and enema or supository occasionally and still needing linzess. As of 6/10: Pt. has days where she can empty ok and days where she needs to use an Enema to stimulate a BM, feels that it is just the insertion of the enema, not the solution that does the job. As of 11/9: Pt. not having constipation due to colonic infection that has shifted her toward diarrhea. Having BM's daily, sometimes type 5. Has had diarrhea more than constipation, still had to take linzess 1 time to go  after 2 days without a BM. 3/14: to be assessed by pelvic therapist 4/25: yes but 50% of the time is normal consistancy    Time 12    Period Weeks    Status On-going    Target Date 09/20/20      PT LONG TERM GOAL #7   Title Pt. will drive to a meaningful activity to demonstrate increased function and participation    Baseline Pt. has not been able to drive due to decreased LE control 3/134: still not driving at this time    Time 12    Period Weeks    Status On-going    Target Date 09/20/20      PT LONG TERM GOAL #8   Title Pt. will be able to perform 1 hr. of light housework reqiring intermittent standing and walking  without increased pain/fatigue >2 pts.    Baseline Pt. becomes fatigued washing fruits/vegetbles in small stages and has fatigue>3 pts. 3/14: able to perform but needs rest breaks 4/25: able to do but is fatiguing    Time 12    Period Weeks    Status Partially Met    Target Date 09/20/20      PT LONG TERM GOAL  #9   TITLE Pt. will be able to attend a full athletic event for her children without Sx increasing by more than 2 pts.    Baseline Pt. having migraine or severe bladder spasms and fatigue and back pain> 3 pt. increase with attendance at athletic events 3/14: has increased pain but is able to attend now, has to use TENS unit 4/25: has not had any games to be able to attempt to go to    Time 12    Period Weeks    Status Partially Met    Target Date 09/20/20                 Plan - 08/16/20 1026    Clinical Impression Statement Pt. Responded well to all interventions today, demonstrating decreased spasm and improved spinal mobility as well as a decrease in pain by 1.5 pts., as well as understanding and correct performance of all education and exercises provided today. They will continue to benefit from skilled physical therapy to work toward remaining goals and maximize function as well as decrease likelihood of symptom increase or recurrence.    PT Next Visit  Plan use balance game for 5 min., ask about stationary bike success/fatigue levels. Ask about taping/re-apply, further suprapubic scar work, deep-core, scapular tracking/strength, and balance, MFR to lower abdomen, Review pain diary, cupping to medial thighs and upper back, re-assess neural glides on LLE, focus on graded activity, and seated and standing balance/core strength. review I's Y's and T's in prone, thoracic mobility? perform further release and PNF for R>L shoulder blades, MFR around B ilium. continue to work on thoracolumbar rotation ROM and decreasing spasms    PT Home Exercise Plan A day: low back stretch, side-stretch, hamstring stretch, butterfly stretch, hip EXT in prone with knee bent, standing hip ABD/EXT, calf raises. B day: Chest-stretch, Thoracic extensions over a towel roll, seated rows and seated scapular retraction (pull shoulder blades together like "T's") with band, child's pose stretch, self internal TP release (especially when Pelvic floor spasms high), Every day: Pain diary, Posterior pelvic tilts with deep core activation and red band for obliques activation, bow-and-arrow, TENS unit 3+ times per day for 30 min and as needed if pain starts to increase. TENS at ASIS/PSIS at strong tingle for 1 hr. per day. Sleeper stretch PRN, vagal nerve toning    Consulted and Agree with Plan of Care Patient           Patient will benefit from skilled therapeutic intervention in order to improve the following deficits and impairments:     Visit Diagnosis: Other muscle spasm  Muscle weakness (generalized)  Difficulty in walking, not elsewhere classified     Problem List Patient Active Problem List   Diagnosis Date Noted  . Seizure ([place]) 11/11/2018  . Major depressive disorder, recurrent episode, moderate ([place]) 02/07/2018  . Nephrolithiasis 04/16/2016  . Numbness 07/28/2015  . Bladder retention 06/23/2015  . Abdominal pain 06/04/2015  . Dizziness 05/18/2015  . Neck pain  05/18/2015  . Complicated migraine 16/01/9603  . Other fatigue 04/28/2015  . Abnormal finding on MRI  of brain 04/28/2015  . D (diarrhea) 03/29/2015  . H/O disease 03/29/2015  . Abnormal weight loss 03/29/2015  . Muscle weakness (generalized) 03/14/2015  . Headache, migraine 03/10/2015   Willa Rough DPT, ATC Willa Rough 08/16/2020, 10:33 AM  [place] MAIN [facility] SERVICES [address] [place], [place], [address] Phone: [phone]   Fax:  [phone]  Name: Christine Chavez MRN: [id number] Date of Birth: May 30, 1980

## 2020-08-19 ENCOUNTER — Other Ambulatory Visit: Payer: Self-pay

## 2020-08-19 ENCOUNTER — Ambulatory Visit: Payer: BC Managed Care – PPO

## 2020-08-19 DIAGNOSIS — M6281 Muscle weakness (generalized): Secondary | ICD-10-CM

## 2020-08-19 DIAGNOSIS — M62838 Other muscle spasm: Secondary | ICD-10-CM | POA: Diagnosis not present

## 2020-08-19 DIAGNOSIS — R262 Difficulty in walking, not elsewhere classified: Secondary | ICD-10-CM

## 2020-08-19 NOTE — Therapy (Signed)
[facility] MAIN [facility] SERVICES [address] [place], [place], [address] Phone: [phone]   Fax:  [phone]  Physical Therapy Treatment  Patient Details  Name: Christine Chavez MRN: [id number] Date of Birth: Aug 24, 1980 No data recorded  Encounter Date: 08/19/2020   PT End of Session - 08/19/20 1230    Visit Number 103    Number of Visits 114    Date for PT Re-Evaluation 09/20/20    Authorization Type [employer]; 6 session balance PT    Authorization Time Period 06/28/20-09/20/20    Authorization - Visit Number 3    Authorization - Number of Visits 24    Progress Note Due on Visit 110    PT Start Time 0936    PT Stop Time 1015    PT Time Calculation (min) 39 min    Activity Tolerance Patient tolerated treatment well;No increased pain    Behavior During Therapy WFL for tasks assessed/performed           Past Medical History:  Diagnosis Date  . Complication of anesthesia    ? seizures after anesthesia   . Headache   . Migraines   . Neurogenic bladder   . Renal disorder   . Vision abnormalities     Past Surgical History:  Procedure Laterality Date  . ANTERIOR CRUCIATE LIGAMENT REPAIR  1997  . APPENDECTOMY    . COLONOSCOPY WITH PROPOFOL N/A 11/11/2018   Procedure: COLONOSCOPY WITH PROPOFOL;  Surgeon: Christena Deem, MD;  Location: [facility] ENDOSCOPY;  Service: Endoscopy;  Laterality: N/A;  . CYSTOSCOPY WITH STENT PLACEMENT Right 04/17/2016   Procedure: CYSTOSCOPY WITH STENT PLACEMENT;  Surgeon: Malen Gauze, MD;  Location: [facility] ORS;  Service: Urology;  Laterality: Right;  . ESOPHAGOGASTRODUODENOSCOPY (EGD) WITH PROPOFOL N/A 11/11/2018   Procedure: ESOPHAGOGASTRODUODENOSCOPY (EGD) WITH PROPOFOL;  Surgeon: Christena Deem, MD;  Location: [facility] ENDOSCOPY;  Service: Endoscopy;  Laterality: N/A;  . EXPLORATORY LAPAROTOMY  1999  . KIDNEY STONE SURGERY  04/2016  . REVISION UROSTOMY CUTANEOUS    . REVISION UROSTOMY CUTANEOUS  01/10/2018   . SUPRAPUBIC CATHETER PLACEMENT  08/2017  . TONSILLECTOMY      There were no vitals filed for this visit.   Subjective Assessment - 08/19/20 1229    Subjective Patient reports she went to urogynocologist since last session and was told the interstem was not a good option. She is having high pain levels at this time and although she hasn't fallen she is fatigued.    Limitations Lifting;Standing;Walking;House hold activities;Other (comment)    Patient Stated Goals to regain PLOF    Currently in Pain? Yes    Pain Score 8     Pain Location Back    Pain Orientation Lower    Pain Descriptors / Indicators Aching    Pain Type Chronic pain    Pain Onset More than a month ago    Pain Frequency Constant                Treatment:  In // bars: close CGA:    Forwards ambulation 2x in // bars. Cues for sequencing, foot placement. Intermittent stabilization provided to LE's.  GTB hip flexion with stabilization to LLE to reduce knee buckling 10x each LE; heavy BUE support Static stand no UE support 45 seconds x 2 trials with frequent trunk sway to the L.  Step over theraband and back; very challenging for patient to perform 10x each LE, stabilization to weightbearing limb provided.  Lateral step over theraband and back; very challenging 10x each LE;   Seated: RTB adduction against PT resistance 12x each LE RTB hamstring curl against PT resistance 12 xeach LE           Patient performed with instruction, verbal cues, tactile cues of therapist: goal: increase tissue extensibility, promote proper posture, improve mobility   Patient has increased episodes of knee buckling and LE trembling limiting her stability and increased need for rest breaks. Heating pad was placed on her back during seated position to reduce back pain and improve function. LE placement and control of placement is improving despite increased trembling.  Patient will benefit from skilled physical therapy to increase  strength, stability, and decrease fall risk for improved quality of life                         PT Education - 08/19/20 1230    Education Details exercise technique, body mechanics    Person(s) Educated Patient    Methods Explanation;Demonstration;Tactile cues;Verbal cues    Comprehension Verbalized understanding;Returned demonstration;Verbal cues required;Tactile cues required            PT Short Term Goals - 08/09/20 1229      PT SHORT TERM GOAL #1   Title Patient will demonstrate coordinated diaphragmatic breathing with pelvic tilts to demonstrate improved control of diaphragm and TA, to allow for further strengthening of core musculature and decreased pelvic floor spasm.    Baseline Pt. demonstrates breathing dysfunction and poor PFM coordination evidenced by anal manometry As of 2/22: Pt. continues to have restriction in her diaphragm and near T/L junction but is able to intentionally use diaphragmatic breathing through the available ROM.    Time 5    Period Weeks    Status Achieved    Target Date 04/20/19      PT SHORT TERM GOAL #2   Title Patient will report a reduction in pain to no greater than 6/10 over the prior week to demonstrate symptom improvement.    Baseline Pain is 10/10 at worst, 2/10 at best As of 2/22: Pt. had achieved this goal for 1 week as of 1/21 but may have infection or other insult that caused pain to increase again. As of 4/29: Pain high of 8/10 but has been over-doing her activity over the past week. As of 6/10: Pt. was able to keep pain at 6 or below since prior visit by using TENS to help keep tension lower but has not had a full week to test if it will keep working. As of 7/6: Pt. had met this goal but has a new UTI and it caused pain to spike to 8/10 over the weekend. As of 11/9: Pt. has just finally made progress with long-standing UTI and new POC for treatment so she is still having pain spikes up to 8/10 but we expect greater  improvement over the next few months. As of 12/30: still feels like she is trending in a better direction but over-did it over the holidays and pain spiked to 8/10. No higher than 6/10 over last 2 days, even with PMS cramping. 3/14: 8/10 pain from bladder spasm 4/25: 8/10 due to bladder/uterine irritation    Time 5    Period Weeks    Status On-going    Target Date 08/02/20      PT SHORT TERM GOAL #3   Title Patient will demonstrate HEP x1 in the clinic to demonstrate understanding  and proper form to allow for further improvement.    Baseline Pt. lacks knowledge of therepeutic exercises that can decrease her pain/Sx.    Time 5    Period Weeks    Status Achieved    Target Date 04/20/19      PT SHORT TERM GOAL #4   Title Patient will report consistent use of foot-stool (squatty-potty) for positioning with BM to decrease pain with BM and intra-abdominal pressure.    Baseline Pt. having constipation due to PFM dysfunction    Time 5    Period Weeks    Status Achieved    Target Date 04/20/19             PT Long Term Goals - 08/09/20 0001      PT LONG TERM GOAL #1   Title Pt. will be able to participate in regular ADL's with least restrictive device without pain increasing greater than 2/10    Baseline Pt. limited in her ability to perform household duties by increased pain and fatigue. As of 4/29: Her legs have more endurance with walking before she gets a tremor. Still needs to sit down the majority of the time she is cooking. Still needs back support for prolonged sitting. As of 6/10: Pt. able to do for  ~1 hour before she needs to rest to prevent increased pain, pain is averaging ~ 4-5/10 3/14: able to perform ADLs with less pain, does still have bad days 4/25: requires stops throughout process more fatigue than pain limiting    Time 12    Period Weeks    Status On-going    Target Date 09/20/20      PT LONG TERM GOAL #3   Title Patient will report no pain with intercourse to  demonstrate improved functional ability.    Baseline Pt. Having significant pain with intercourse. As of 4/29: is a little better (20-30%) but is worse on days where overall tension/pain is worse. As of 6/10: can have days where there is not much pain but other days where there is significant pain. As of 11/9: same as previous update. AS of 04/15/20: 25% improved from base-line 3/14: to be asssessed next session by pelvic therapist 4/25: some pain iwth intercouse but not always    Time 12    Period Weeks    Status Partially Met    Target Date 09/20/20      PT LONG TERM GOAL #5   Title Pt. will be able to go 2-3 hours between emptying her bladder without increased pain and empty her bladder fully whether by urostomy, cath, or voluntary release.    Baseline Pt. has a urostomy but continues to have to self-catheterize. Has pain with bladder filling and when using catheter. As of 4/29: is doing better since starting to take the pregabalin andd baclofen but occasionally still has pain with bladder filling. It is bad when she has an infection or over-does activity, better on "normal" days which have been few and far between due to frequent UTI's. As of 6/10: Pt. has "good" and "bad" days but has more days where she can go 2-3 hours without increased pain. As of 04/15/20: able to wait 2 hours, not 3 yet 3/14: somedays able to meet this goals, some days is more frequent 4/25: good day: yes bad day every hour    Time 12    Period Weeks    Status Partially Met    Target Date 11/01/20  PT LONG TERM GOAL #6   Title Patient will report having BM's at least every-other day with consistency between [facility] stool scale 3-5 over the prior week to demonstrate decreased constipation.    Baseline Pt. unable to have regula BM's without medication, manometry shows PFM dysfunction. As of 4/29: still having to use and enema or supository occasionally and still needing linzess. As of 6/10: Pt. has days where she can  empty ok and days where she needs to use an Enema to stimulate a BM, feels that it is just the insertion of the enema, not the solution that does the job. As of 11/9: Pt. not having constipation due to colonic infection that has shifted her toward diarrhea. Having BM's daily, sometimes type 5. Has had diarrhea more than constipation, still had to take linzess 1 time to go after 2 days without a BM. 3/14: to be assessed by pelvic therapist 4/25: yes but 50% of the time is normal consistancy    Time 12    Period Weeks    Status On-going    Target Date 09/20/20      PT LONG TERM GOAL #7   Title Pt. will drive to a meaningful activity to demonstrate increased function and participation    Baseline Pt. has not been able to drive due to decreased LE control 3/134: still not driving at this time    Time 12    Period Weeks    Status On-going    Target Date 09/20/20      PT LONG TERM GOAL #8   Title Pt. will be able to perform 1 hr. of light housework reqiring intermittent standing and walking without increased pain/fatigue >2 pts.    Baseline Pt. becomes fatigued washing fruits/vegetbles in small stages and has fatigue>3 pts. 3/14: able to perform but needs rest breaks 4/25: able to do but is fatiguing    Time 12    Period Weeks    Status Partially Met    Target Date 09/20/20      PT LONG TERM GOAL  #9   TITLE Pt. will be able to attend a full athletic event for her children without Sx increasing by more than 2 pts.    Baseline Pt. having migraine or severe bladder spasms and fatigue and back pain> 3 pt. increase with attendance at athletic events 3/14: has increased pain but is able to attend now, has to use TENS unit 4/25: has not had any games to be able to attempt to go to    Time 12    Period Weeks    Status Partially Met    Target Date 09/20/20                 Plan - 08/19/20 1232    Clinical Impression Statement Patient has increased episodes of knee buckling and LE trembling  limiting her stability and increased need for rest breaks. Heating pad was placed on her back during seated position to reduce back pain and improve function. LE placement and control of placement is improving despite increased trembling.  Patient will benefit from skilled physical therapy to increase strength, stability, and decrease fall risk for improved quality of life    Personal Factors and Comorbidities Comorbidity 3+    Comorbidities neurologic insult with pending diagnosis, multiple recurrent UTI's, BLE weakness, depression    Examination-Activity Limitations Caring for Others;Carry;Continence;Squat;Stairs;Toileting;Locomotion Level;Lift;Stand    Examination-Participation Restrictions Church;Cleaning;Community Activity;Driving;Interpersonal Relationship;Laundry;Yard Work;Meal Prep    Stability/Clinical Decision Making Unstable/Unpredictable  Rehab Potential Fair    PT Frequency 2x / week    PT Duration 12 weeks    PT Treatment/Interventions ADLs/Self Care Home Management;Aquatic Therapy;Biofeedback;Moist Heat;Electrical Stimulation;Cryotherapy;Traction;Ultrasound;Therapeutic activities;Functional mobility training;Stair training;Gait training;Therapeutic exercise;Balance training;Neuromuscular re-education;Patient/family education;Manual techniques;Dry needling;Scar mobilization;Energy conservation;Taping;Joint Manipulations;Spinal Manipulations;Visual/perceptual remediation/compensation;Passive range of motion    PT Next Visit Plan use balance game for 5 min., ask about stationary bike success/fatigue levels. Ask about taping/re-apply, further suprapubic scar work, deep-core, scapular tracking/strength, and balance, MFR to lower abdomen, Review pain diary, cupping to medial thighs and upper back, re-assess neural glides on LLE, focus on graded activity, and seated and standing balance/core strength. review I's Y's and T's in prone, thoracic mobility? perform further release and PNF for R>L  shoulder blades, MFR around B ilium. continue to work on thoracolumbar rotation ROM and decreasing spasms    PT Home Exercise Plan A day: low back stretch, side-stretch, hamstring stretch, butterfly stretch, hip EXT in prone with knee bent, standing hip ABD/EXT, calf raises. B day: Chest-stretch, Thoracic extensions over a towel roll, seated rows and seated scapular retraction (pull shoulder blades together like "T's") with band, child's pose stretch, self internal TP release (especially when Pelvic floor spasms high), Every day: Pain diary, Posterior pelvic tilts with deep core activation and red band for obliques activation, bow-and-arrow, TENS unit 3+ times per day for 30 min and as needed if pain starts to increase. TENS at ASIS/PSIS at strong tingle for 1 hr. per day. Sleeper stretch PRN, vagal nerve toning    Consulted and Agree with Plan of Care Patient           Patient will benefit from skilled therapeutic intervention in order to improve the following deficits and impairments:  Abnormal gait,Decreased balance,Decreased endurance,Decreased mobility,Difficulty walking,Increased muscle spasms,Impaired sensation,Improper body mechanics,Impaired tone,Decreased activity tolerance,Decreased coordination,Decreased strength,Postural dysfunction,Pain  Visit Diagnosis: Other muscle spasm  Muscle weakness (generalized)  Difficulty in walking, not elsewhere classified     Problem List Patient Active Problem List   Diagnosis Date Noted  . Seizure ([place]) 11/11/2018  . Major depressive disorder, recurrent episode, moderate ([place]) 02/07/2018  . Nephrolithiasis 04/16/2016  . Numbness 07/28/2015  . Bladder retention 06/23/2015  . Abdominal pain 06/04/2015  . Dizziness 05/18/2015  . Neck pain 05/18/2015  . Complicated migraine 86/[id number]/[id number]  . Other fatigue 04/28/2015  . Abnormal finding on MRI of brain 04/28/2015  . D (diarrhea) 03/29/2015  . H/O disease 03/29/2015  . Abnormal weight loss  03/29/2015  . Muscle weakness (generalized) 03/14/2015  . Headache, migraine 03/10/2015   Janna Arch, PT, DPT   08/19/2020, 12:33 PM  [place] MAIN [facility] SERVICES [address] [place], [place], [address] Phone: [phone]   Fax:  [phone]  Name: Christine Chavez MRN: [id number] Date of Birth: Oct 24, 1980

## 2020-08-24 ENCOUNTER — Ambulatory Visit: Payer: BC Managed Care – PPO

## 2020-08-24 ENCOUNTER — Other Ambulatory Visit: Payer: Self-pay

## 2020-08-24 DIAGNOSIS — R262 Difficulty in walking, not elsewhere classified: Secondary | ICD-10-CM

## 2020-08-24 DIAGNOSIS — M62838 Other muscle spasm: Secondary | ICD-10-CM | POA: Diagnosis not present

## 2020-08-24 DIAGNOSIS — M6281 Muscle weakness (generalized): Secondary | ICD-10-CM

## 2020-08-24 NOTE — Therapy (Signed)
[place] MAIN [facility] SERVICES [address] [place], [place], [address] Phone: [phone]   Fax:  [phone]  Physical Therapy Treatment  The patient has been informed of current processes in place at Outpatient Rehab to protect patients from Covid-19 exposure including social distancing, schedule modifications, and new cleaning procedures. After discussing their particular risk with a therapist based on the patient's personal risk factors, the patient has decided to proceed with in-person therapy.  Patient Details  Name: Christine Chavez MRN: [id number] Date of Birth: [birth year] No data recorded  Encounter Date: 08/24/2020   PT End of Session - 08/28/20 0926    Visit Number 104    Number of Visits 114    Date for PT Re-Evaluation 09/20/20    Authorization Type [employer]; 6 session balance PT    Authorization Time Period 06/28/20-09/20/20    Authorization - Visit Number 4    Authorization - Number of Visits 24    Progress Note Due on Visit 110    PT Start Time 1330    PT Stop Time 1430    PT Time Calculation (min) 60 min    Activity Tolerance Patient tolerated treatment well;No increased pain    Behavior During Therapy WFL for tasks assessed/performed           Past Medical History:  Diagnosis Date  . Complication of anesthesia    ? seizures after anesthesia   . Headache   . Migraines   . Neurogenic bladder   . Renal disorder   . Vision abnormalities     Past Surgical History:  Procedure Laterality Date  . ANTERIOR CRUCIATE LIGAMENT REPAIR  1997  . APPENDECTOMY    . COLONOSCOPY WITH PROPOFOL N/A 11/11/2018   Procedure: COLONOSCOPY WITH PROPOFOL;  Surgeon: [name], MD;  Location: [facility] ENDOSCOPY;  Service: Endoscopy;  Laterality: N/A;  . CYSTOSCOPY WITH STENT PLACEMENT Right 04/17/2016   Procedure: CYSTOSCOPY WITH STENT PLACEMENT;  Surgeon: [name], MD;  Location: [facility] ORS;  Service: Urology;  Laterality: Right;   . ESOPHAGOGASTRODUODENOSCOPY (EGD) WITH PROPOFOL N/A 11/11/2018   Procedure: ESOPHAGOGASTRODUODENOSCOPY (EGD) WITH PROPOFOL;  Surgeon: [name], MD;  Location: [facility] ENDOSCOPY;  Service: Endoscopy;  Laterality: N/A;  . EXPLORATORY LAPAROTOMY  1999  . KIDNEY STONE SURGERY  04/2016  . REVISION UROSTOMY CUTANEOUS    . REVISION UROSTOMY CUTANEOUS  01/10/2018  . SUPRAPUBIC CATHETER PLACEMENT  08/2017  . TONSILLECTOMY      There were no vitals filed for this visit.  Pelvic Floor Physical Therapy Treatment Note  SCREENING  Changes in medications, allergies, or medical history?: she has been diagnosed with HSP  SUBJECTIVE  Patient reports:  She was told that Interstim is likely not a good fit for her because of the pain vs. Bladder leakage. She does have an active infection and is now on 2 more medications to help with OAB and the UTI. She is having a lot of emotional. She has been diagnosed with hereditary spastic paraplegia. They are concerned that she may be not be getting enough oxygen and that it may start to effect her breathing. It is a chronic progressive disorder.  It looks like the medications she is on are good for her, that muscle relaxers are the biggest help. Her B12 deficiency could have been an indicator.   Precautions:  HSP, Urostomy   Pain update:  Location of pain: LB and neck/shoulders (bladder) Current pain:  6/10) (6/10) Max pain: 8/10 (  8/10 when it spasms) Least pain:  5/10 (5/10) Nature of pain: sharp to dull ache (grabbing)  ** feels "much looser" through the upper back/neck  Patient Goals: Get rid of the discomfort with catheterizing and ache/bladder pain. Be able to take fewer medications and be able to enjoy spending time with her family again, doing "all the mom things"    OBJECTIVE  Changes in:  Observations/Posture:  L lumbar curve prominent with transverse processes more prominent on the L. (from previous note)  C3 spinous process  deviated L through C2-3. (from previous note)  12/30: L up-slip and anterior rotation **following treatment L iliacus remained tight, not allowing for complete up-slip or rotation correction.  07/01/20: PSIS appear level in prone, L5 spinous process prominent.  07/26/20: L up-slip and R anterior rotation.  08/12/20: R anterior rotation.   Range of Motion/Flexibilty:  (from 03/27/19) Spine: ~ 2-3 fingers from knee with SB B. ~ 50% reduced R rotation with stretch in L, ~ 75% reduced L rotation with increased tightness. HS: able to achieve 90 deg. Hip flexion in seated HS stretch. (from prior treatment)  Decreased PIVM through Thoracic, lumbar, and sacral spine with greatest restriction at T3-6. (from prior session)  12/30:  no decreased mobility at the sacrum with attempt at manual correction for L anterior rotation, hip-flexor spasm keeping Pt. From approp  07/01/20: Decreased scar mobility in the suprapubic region.   07/08/20: Pt. Demonstrated decreased ROM in R hip IR/ER compared to L with empty end-feel. **following treatment Pt. Able to demonstrate normal ROM without pain for IR/ER and no pain with aggravating motion of getting in/out of w/c or car.  07/22/20: decreased scar mobility in supra-pubic region in cephalic Directon. Decreased L to R cervical PIVM mobility.  08/05/20: decreased scar mobility in supra-pubic region in cephalic Directon.  08/16/20: decreased mobility through entire thoracic and lumbar spine with greatest restriction at ~ T7-10 and through lumbar curve.  08/24/20: decreased mobility through the thoracic spine.  Strength/MMT:  LE MMT (from 03-27-19) LE MMT Left Right  Hip flex:  (L2) /5 /5  Hip ext: 3+/5 3+/5  Hip abd: 4/5 4/5  Hip add: 4/5 3+/5  Hip IR 4/5 3+/5  Hip ER 4/5 4/5   (From 3/24): Pt. Demonstrates 3/5 strength with "I's, Y's, and T's" but 4/5 with W's. (from 6/22) DF: 9 reps with B. Eversion/Inversion: 4/5 on R, 4+/5 on L. DF: L 4+/5, R 3+/5   Seated IR/ER on L: 4/5, R 3+/5 (from 8-5) L DF and Eversion 4+/5 with first 1-3 reps and fatigue decreased to 4/4. (9/9) Pt. Demonstrates ~ 4+/5 B with increased spasticity B.   Balance/stability:  (6-29) Pt. Able to achieve good posture without cueing but only able to maintain for ~ 2.5 min. Before seeking stability from the table and forward rolled shoulders. With cue to engage the deep core, Pt. Recognizes increased stability and comfort.  8/5: Pt. Demonstrating decreased stability in gait and standing balance today with fasciculations increasing in direct relation to muscular endurance required. Able to stand ~ 10 sec. In SLS with MIN UE support before intensity of spasms make attempt at balance futile.   12/30: Pt. Reports feeling less stable on LLE again, similar to August but less intense.   1/31: Able to stand for ~ 10 sec. In double leg stance and on RLE with no LOB the first attempt but required TC and VC for L glute med. Activation for LLE. Second attempt less cueing recuired  for LLE but far more fasiculations noted. Was able to stand with slow perterbations in multiple directions applied to the shoulders for ~ 20 sec. Before fatigue slowed reaction time.  2/17: performed circle maze and square maze on balance machine with BUE support. Circle: 134 sec. -4 pts. Square: 137 sec. And -17 pts.   Pelvic Floor External Exam: (From prior session)- [Introitus Appears: elevated Skin integrity: WNL Palpation: TTP to B STP Cough: limited motion Prolapse visible?: no Scar mobility: not assessed  Internal Vaginal Exam: Strength (PERF): Pt. Has difficulty sensing the PFM to generate contraction or relaxation due to high tone/neuro involvement Symmetry: L>R for spasm/TTP Palpation: TTP to all muscles throughout B Prolapse: none]  unable to coordinate squeeze and TTP to all areas with the greatest at B OI and coccygeus. ** following TP release Pt. Able to achieve 1/5 PFM contraction.  Decreased TTP at B anterior PR/PC following treatment. (10/14).  TODAY: not re-assessed today.  Abdominal:  Pt. Demonstrated ability to recruit R>L obliques and TA with pelvic tilts. (from prior session)  Palpation: Decreased fascial and scar mobility through lower abdomen with radiating Sx. To the pelvis and LB. (from prior session)  12/30: TTP to R Piriformis and OI, L QL and Iliacus  Murphy's punch sign: Some sharp pain that shot forward with percussion to B kidneys. (from 06/03/20)   07/01/20: TTP to B erector spinae and multifidus from L3-5  07/08/20: TTP to R adductor brevis and L iliacus and psoas.   07/22/20: TTP to L cervical extensors, B sub-occipitals   07/26/20: TTP to R>L adductors.  08/05/20: TTP to B iliacus   08/12/20: TTP to B adductor brevis and B multifidus from T11-L4.  08/16/20: TTP to B QL and erector spinae and multifidus along entire spine.  Gait Analysis:  (04/22/20) 2 min. Walk test: 12 meters on 1st attempt. 7.75 meters on 2nd attempt. (video)  Pt. Demonstrated crouched walk with difficulty controlling where she placed the R>L LE and ~ 3-4 near LOB while walking on 2nd attempt with PT at [facility] and using Rollator  (Pt. Feels like this would have been better 2-3 days ago even)  2/3: Pt. Walked ~ 50 ft. x2 With rollator and gait belt. Attempted to walk to metronome cadence but with difficulty, ended up keeping to ~ 1/2 time of the tempo with ~ 50% consistency. Some difficulty with control noted but less than during initial walk test.  INTERVENTIONS THIS SESSION: Manual: Performed PA mobs from T2-7 and TP release to R>L erector spinae and B upper traps followed by AP chest opening overpressure to anterior shoulders with grade 2-3 oscillations to decrease spasm and pain and allow for improved balance of musculature for improved function and decreased symptoms and to improve mobility of joint and surrounding connective tissue and decrease pressure on nerve roots for  improved conductivity and function of down-stream tissues.     Self-care: Discussed new diagnosis and what it will mean for her PT moving forward. Supported Pt. In validating her feelings and encouraging her to see how this can help her find meaning in her experiences moving forward.  Total time: 60 min.                              PT Short Term Goals - 08/09/20 1229      PT SHORT TERM GOAL #1   Title Patient will demonstrate coordinated diaphragmatic breathing with pelvic tilts to  demonstrate improved control of diaphragm and TA, to allow for further strengthening of core musculature and decreased pelvic floor spasm.    Baseline Pt. demonstrates breathing dysfunction and poor PFM coordination evidenced by anal manometry As of 2/22: Pt. continues to have restriction in her diaphragm and near T/L junction but is able to intentionally use diaphragmatic breathing through the available ROM.    Time 5    Period Weeks    Status Achieved    Target Date 04/20/19      PT SHORT TERM GOAL #2   Title Patient will report a reduction in pain to no greater than 6/10 over the prior week to demonstrate symptom improvement.    Baseline Pain is 10/10 at worst, 2/10 at best As of 2/22: Pt. had achieved this goal for 1 week as of 1/21 but may have infection or other insult that caused pain to increase again. As of 4/29: Pain high of 8/10 but has been over-doing her activity over the past week. As of 6/10: Pt. was able to keep pain at 6 or below since prior visit by using TENS to help keep tension lower but has not had a full week to test if it will keep working. As of 7/6: Pt. had met this goal but has a new UTI and it caused pain to spike to 8/10 over the weekend. As of 11/9: Pt. has just finally made progress with long-standing UTI and new POC for treatment so she is still having pain spikes up to 8/10 but we expect greater improvement over the next few months. As of 12/30: still feels  like she is trending in a better direction but over-did it over the holidays and pain spiked to 8/10. No higher than 6/10 over last 2 days, even with PMS cramping. 3/14: 8/10 pain from bladder spasm 4/25: 8/10 due to bladder/uterine irritation    Time 5    Period Weeks    Status On-going    Target Date 08/02/20      PT SHORT TERM GOAL #3   Title Patient will demonstrate HEP x1 in the clinic to demonstrate understanding and proper form to allow for further improvement.    Baseline Pt. lacks knowledge of therepeutic exercises that can decrease her pain/Sx.    Time 5    Period Weeks    Status Achieved    Target Date 04/20/19      PT SHORT TERM GOAL #4   Title Patient will report consistent use of foot-stool (squatty-potty) for positioning with BM to decrease pain with BM and intra-abdominal pressure.    Baseline Pt. having constipation due to PFM dysfunction    Time 5    Period Weeks    Status Achieved    Target Date 04/20/19             PT Long Term Goals - 08/09/20 0001      PT LONG TERM GOAL #1   Title Pt. will be able to participate in regular ADL's with least restrictive device without pain increasing greater than 2/10    Baseline Pt. limited in her ability to perform household duties by increased pain and fatigue. As of 4/29: Her legs have more endurance with walking before she gets a tremor. Still needs to sit down the majority of the time she is cooking. Still needs back support for prolonged sitting. As of 6/10: Pt. able to do for  ~1 hour before she needs to rest to prevent increased pain, pain  is averaging ~ 4-5/10 3/14: able to perform ADLs with less pain, does still have bad days 4/25: requires stops throughout process more fatigue than pain limiting    Time 12    Period Weeks    Status On-going    Target Date 09/20/20      PT LONG TERM GOAL #3   Title Patient will report no pain with intercourse to demonstrate improved functional ability.    Baseline Pt. Having  significant pain with intercourse. As of 4/29: is a little better (20-30%) but is worse on days where overall tension/pain is worse. As of 6/10: can have days where there is not much pain but other days where there is significant pain. As of 11/9: same as previous update. AS of 04/15/20: 25% improved from base-line 3/14: to be asssessed next session by pelvic therapist 4/25: some pain iwth intercouse but not always    Time 12    Period Weeks    Status Partially Met    Target Date 09/20/20      PT LONG TERM GOAL #5   Title Pt. will be able to go 2-3 hours between emptying her bladder without increased pain and empty her bladder fully whether by urostomy, cath, or voluntary release.    Baseline Pt. has a urostomy but continues to have to self-catheterize. Has pain with bladder filling and when using catheter. As of 4/29: is doing better since starting to take the pregabalin andd baclofen but occasionally still has pain with bladder filling. It is bad when she has an infection or over-does activity, better on "normal" days which have been few and far between due to frequent UTI's. As of 6/10: Pt. has "good" and "bad" days but has more days where she can go 2-3 hours without increased pain. As of 04/15/20: able to wait 2 hours, not 3 yet 3/14: somedays able to meet this goals, some days is more frequent 4/25: good day: yes bad day every hour    Time 12    Period Weeks    Status Partially Met    Target Date 11/01/20      PT LONG TERM GOAL #6   Title Patient will report having BM's at least every-other day with consistency between [facility] stool scale 3-5 over the prior week to demonstrate decreased constipation.    Baseline Pt. unable to have regula BM's without medication, manometry shows PFM dysfunction. As of 4/29: still having to use and enema or supository occasionally and still needing linzess. As of 6/10: Pt. has days where she can empty ok and days where she needs to use an Enema to stimulate a BM,  feels that it is just the insertion of the enema, not the solution that does the job. As of 11/9: Pt. not having constipation due to colonic infection that has shifted her toward diarrhea. Having BM's daily, sometimes type 5. Has had diarrhea more than constipation, still had to take linzess 1 time to go after 2 days without a BM. 3/14: to be assessed by pelvic therapist 4/25: yes but 50% of the time is normal consistancy    Time 12    Period Weeks    Status On-going    Target Date 09/20/20      PT LONG TERM GOAL #7   Title Pt. will drive to a meaningful activity to demonstrate increased function and participation    Baseline Pt. has not been able to drive due to decreased LE control 3/134: still not driving  at this time    Time 12    Period Weeks    Status On-going    Target Date 09/20/20      PT LONG TERM GOAL #8   Title Pt. will be able to perform 1 hr. of light housework reqiring intermittent standing and walking without increased pain/fatigue >2 pts.    Baseline Pt. becomes fatigued washing fruits/vegetbles in small stages and has fatigue>3 pts. 3/14: able to perform but needs rest breaks 4/25: able to do but is fatiguing    Time 12    Period Weeks    Status Partially Met    Target Date 09/20/20      PT LONG TERM GOAL  #9   TITLE Pt. will be able to attend a full athletic event for her children without Sx increasing by more than 2 pts.    Baseline Pt. having migraine or severe bladder spasms and fatigue and back pain> 3 pt. increase with attendance at athletic events 3/14: has increased pain but is able to attend now, has to use TENS unit 4/25: has not had any games to be able to attempt to go to    Time 12    Period Weeks    Status Partially Met    Target Date 09/20/20                 Plan - 08/28/20 0928    Clinical Impression Statement Pt. Responded well to all interventions today, demonstrating decreased pain and increased upper thoracic mobility, as well as  understanding and correct performance of all education and exercises provided today. They will continue to benefit from skilled physical therapy to work toward remaining goals and maximize function as well as decrease likelihood of symptom increase or recurrence.    PT Next Visit Plan ask about stationary bike success/fatigue levels. Ask about taping/re-apply, further suprapubic scar work, deep-core, scapular tracking/strength, and balance, MFR to lower abdomen, Review pain diary, cupping to medial thighs and upper back, re-assess neural glides on LLE, focus on graded activity, and seated and standing balance/core strength. review I's Y's and T's in prone, thoracic mobility? perform further release and PNF for R>L shoulder blades, MFR around B ilium. continue to work on thoracolumbar rotation ROM and decreasing spasms    PT Home Exercise Plan A day: low back stretch, side-stretch, hamstring stretch, butterfly stretch, hip EXT in prone with knee bent, standing hip ABD/EXT, calf raises. B day: Chest-stretch, Thoracic extensions over a towel roll, seated rows and seated scapular retraction (pull shoulder blades together like "T's") with band, child's pose stretch, self internal TP release (especially when Pelvic floor spasms high), Every day: Pain diary, Posterior pelvic tilts with deep core activation and red band for obliques activation, bow-and-arrow, TENS unit 3+ times per day for 30 min and as needed if pain starts to increase. TENS at ASIS/PSIS at strong tingle for 1 hr. per day. Sleeper stretch PRN, vagal nerve toning    Consulted and Agree with Plan of Care Patient           Patient will benefit from skilled therapeutic intervention in order to improve the following deficits and impairments:     Visit Diagnosis: Other muscle spasm  Muscle weakness (generalized)  Difficulty in walking, not elsewhere classified     Problem List Patient Active Problem List   Diagnosis Date Noted  . Seizure  ([place]) 11/11/2018  . Major depressive disorder, recurrent episode, moderate ([place]) 02/07/2018  . Nephrolithiasis 04/16/2016  .  Numbness 07/28/2015  . Bladder retention 06/23/2015  . Abdominal pain 06/04/2015  . Dizziness 05/18/2015  . Neck pain 05/18/2015  . Complicated migraine 26/20/3559  . Other fatigue 04/28/2015  . Abnormal finding on MRI of brain 04/28/2015  . D (diarrhea) 03/29/2015  . H/O disease 03/29/2015  . Abnormal weight loss 03/29/2015  . Muscle weakness (generalized) 03/14/2015  . Headache, migraine 03/10/2015   Willa Rough DPT, ATC Willa Rough 08/28/2020, 9:55 AM  [place] MAIN [facility] SERVICES [address] [place], [place], [address] Phone: [phone]   Fax:  [phone]  Name: Christine Chavez MRN: [id number] Date of Birth: [birth year]

## 2020-08-26 ENCOUNTER — Ambulatory Visit: Payer: BC Managed Care – PPO

## 2020-08-30 ENCOUNTER — Ambulatory Visit: Payer: BC Managed Care – PPO

## 2020-09-02 ENCOUNTER — Other Ambulatory Visit: Payer: Self-pay

## 2020-09-02 ENCOUNTER — Ambulatory Visit: Payer: BC Managed Care – PPO

## 2020-09-02 DIAGNOSIS — R262 Difficulty in walking, not elsewhere classified: Secondary | ICD-10-CM

## 2020-09-02 DIAGNOSIS — M62838 Other muscle spasm: Secondary | ICD-10-CM

## 2020-09-02 DIAGNOSIS — M6281 Muscle weakness (generalized): Secondary | ICD-10-CM

## 2020-09-02 NOTE — Therapy (Signed)
[place] MAIN [facility] SERVICES [address] [place], [place], [address] Phone: [phone]   Fax:  [phone]  Physical Therapy Treatment  Patient Details  Name: Christine Chavez MRN: [id number] Date of Birth: 02-28-1981 No data recorded  Encounter Date: 09/02/2020   PT End of Session - 09/02/20 1306    Visit Number 105    Number of Visits 114    Date for PT Re-Evaluation 09/20/20    Authorization Type [employer]; 6 session balance PT    Authorization Time Period 06/28/20-09/20/20    Authorization - Visit Number 5    Authorization - Number of Visits 24    Progress Note Due on Visit 110    PT Start Time 0930    PT Stop Time 1015    PT Time Calculation (min) 45 min    Activity Tolerance Patient tolerated treatment well;No increased pain    Behavior During Therapy WFL for tasks assessed/performed           Past Medical History:  Diagnosis Date  . Complication of anesthesia    ? seizures after anesthesia   . Headache   . Migraines   . Neurogenic bladder   . Renal disorder   . Vision abnormalities     Past Surgical History:  Procedure Laterality Date  . ANTERIOR CRUCIATE LIGAMENT REPAIR  1997  . APPENDECTOMY    . COLONOSCOPY WITH PROPOFOL N/A 11/11/2018   Procedure: COLONOSCOPY WITH PROPOFOL;  Surgeon: [name], MD;  Location: [facility] ENDOSCOPY;  Service: Endoscopy;  Laterality: N/A;  . CYSTOSCOPY WITH STENT PLACEMENT Right 04/17/2016   Procedure: CYSTOSCOPY WITH STENT PLACEMENT;  Surgeon: [name], MD;  Location: [facility] ORS;  Service: Urology;  Laterality: Right;  . ESOPHAGOGASTRODUODENOSCOPY (EGD) WITH PROPOFOL N/A 11/11/2018   Procedure: ESOPHAGOGASTRODUODENOSCOPY (EGD) WITH PROPOFOL;  Surgeon: [name], MD;  Location: [facility] ENDOSCOPY;  Service: Endoscopy;  Laterality: N/A;  . EXPLORATORY LAPAROTOMY  1999  . KIDNEY STONE SURGERY  04/2016  . REVISION UROSTOMY CUTANEOUS    . REVISION UROSTOMY CUTANEOUS  01/10/2018   . SUPRAPUBIC CATHETER PLACEMENT  08/2017  . TONSILLECTOMY      There were no vitals filed for this visit.   Subjective Assessment - 09/02/20 1305    Subjective Patient went on vacation last week after her new diagnosis as well as ED visit.    Limitations Lifting;Standing;Walking;House hold activities;Other (comment)    Patient Stated Goals to regain PLOF    Currently in Pain? 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 Patient given resources for foundations for her new diagnosis.     HSP info Spastic Paraplegia Foundation - Hereditary Spastic Paraplegia & Primary Lateral SclerosisF (sp-[email or web address])  Armed forces logistics/support/administrative officer for people with Hereditary Spastic Paraplegia or Primary Lateral Sclerosis (sp-[email or web address])  Home - [employer] Psychologist, sport and exercise for Rare Disorders) (rarediseases.[email or web address])   Treatment: Supine: Heel slides 15x each LE, cues for core activation  LE rotation 10x each LE low back pain relieving  Modified bridge with focus on gluteal activation squeezes 10x  BTB around  bilateral knees: abduction 15x  BTB around bilateral knees: single limb march with cues for core activation 10x each LE Adduction small green ball squeezes 10x 3 second holds Hamstring curls on swiss ball with stabilization provided to ball 10x TrA activation with swiss ball in hooklying 12x 3 second holds   seated: LAQ 12x each LE with trembling noted with fatigue   Pt educated throughout session about proper posture and technique with exercises. 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 Patient given resources for foundations for her new diagnosis. Education on plan of care and continuation of research to ensure maximal therapeutic alliance for support performed. Supine and seated interventions performed with patient requiring multiple rest breaks. Patient will benefit from skilled physical therapy to increase strength, stability, and decrease fall risk for improved quality of life        PT Education - 09/02/20 1306    Education Details exercise technique, support groups, POC    Person(s) Educated Patient    Methods Explanation;Demonstration;Tactile cues;Verbal cues    Comprehension Verbalized understanding;Returned demonstration;Verbal cues required;Tactile cues required            PT Short Term Goals - 08/09/20 1229      PT SHORT TERM GOAL #1   Title Patient will demonstrate coordinated diaphragmatic breathing with pelvic tilts to demonstrate improved control of diaphragm and TA, to allow for further strengthening of core musculature and decreased pelvic floor spasm.    Baseline Pt. demonstrates breathing dysfunction and poor PFM coordination evidenced by anal manometry As of 2/22: Pt. continues to have restriction in her diaphragm and near T/L junction but is able to intentionally use diaphragmatic breathing through the available ROM.    Time 5    Period Weeks    Status Achieved    Target Date 04/20/19      PT SHORT TERM GOAL #2   Title Patient will report a reduction in pain to no greater than 6/10 over the prior week to demonstrate symptom improvement.    Baseline Pain is 10/10 at worst, 2/10 at best As of 2/22: Pt. had achieved this goal for 1 week as of 1/21 but may have infection or other insult that caused pain to increase again. As of 4/29: Pain high of 8/10 but has been over-doing her activity over the past week. As of 6/10: Pt. was able to keep pain at 6 or below since prior visit by using TENS to help keep tension lower but has not had a full  week to test if it will keep working. As of 7/6: Pt. had met this goal but has a new UTI and it caused pain to spike to 8/10 over the weekend. As of 11/9: Pt. has just finally made progress with long-standing UTI and new POC for treatment so she is still having pain spikes up to 8/10 but we expect greater improvement over the next few months. As of 12/30: still feels like she is trending in a better direction but over-did it over the holidays and pain spiked to 8/10. No higher than 6/10 over last 2 days, even with PMS cramping. 3/14: 8/10 pain from bladder spasm 4/25: 8/10 due to bladder/uterine irritation    Time 5    Period Weeks    Status On-going    Target Date 08/02/20  PT SHORT TERM GOAL #3   Title Patient will demonstrate HEP x1 in the clinic to demonstrate understanding and proper form to allow for further improvement.    Baseline Pt. lacks knowledge of therepeutic exercises that can decrease her pain/Sx.    Time 5    Period Weeks    Status Achieved    Target Date 04/20/19      PT SHORT TERM GOAL #4   Title Patient will report consistent use of foot-stool (squatty-potty) for positioning with BM to decrease pain with BM and intra-abdominal pressure.    Baseline Pt. having constipation due to PFM dysfunction    Time 5    Period Weeks    Status Achieved    Target Date 04/20/19             PT Long Term Goals - 08/09/20 0001      PT LONG TERM GOAL #1   Title Pt. will be able to participate in regular ADL's with least restrictive device without pain increasing greater than 2/10    Baseline Pt. limited in her ability to perform household duties by increased pain and fatigue. As of 4/29: Her legs have more endurance with walking before she gets a tremor. Still needs to sit down the majority of the time she is cooking. Still needs back support for prolonged sitting. As of 6/10: Pt. able to do for  ~1 hour before she needs to rest to prevent increased pain, pain is averaging ~ 4-5/10  3/14: able to perform ADLs with less pain, does still have bad days 4/25: requires stops throughout process more fatigue than pain limiting    Time 12    Period Weeks    Status On-going    Target Date 09/20/20      PT LONG TERM GOAL #3   Title Patient will report no pain with intercourse to demonstrate improved functional ability.    Baseline Pt. Having significant pain with intercourse. As of 4/29: is a little better (20-30%) but is worse on days where overall tension/pain is worse. As of 6/10: can have days where there is not much pain but other days where there is significant pain. As of 11/9: same as previous update. AS of 04/15/20: 25% improved from base-line 3/14: to be asssessed next session by pelvic therapist 4/25: some pain iwth intercouse but not always    Time 12    Period Weeks    Status Partially Met    Target Date 09/20/20      PT LONG TERM GOAL #5   Title Pt. will be able to go 2-3 hours between emptying her bladder without increased pain and empty her bladder fully whether by urostomy, cath, or voluntary release.    Baseline Pt. has a urostomy but continues to have to self-catheterize. Has pain with bladder filling and when using catheter. As of 4/29: is doing better since starting to take the pregabalin andd baclofen but occasionally still has pain with bladder filling. It is bad when she has an infection or over-does activity, better on "normal" days which have been few and far between due to frequent UTI's. As of 6/10: Pt. has "good" and "bad" days but has more days where she can go 2-3 hours without increased pain. As of 04/15/20: able to wait 2 hours, not 3 yet 3/14: somedays able to meet this goals, some days is more frequent 4/25: good day: yes bad day every hour    Time 12  Period Weeks    Status Partially Met    Target Date 11/01/20      PT LONG TERM GOAL #6   Title Patient will report having BM's at least every-other day with consistency between [facility] stool scale  3-5 over the prior week to demonstrate decreased constipation.    Baseline Pt. unable to have regula BM's without medication, manometry shows PFM dysfunction. As of 4/29: still having to use and enema or supository occasionally and still needing linzess. As of 6/10: Pt. has days where she can empty ok and days where she needs to use an Enema to stimulate a BM, feels that it is just the insertion of the enema, not the solution that does the job. As of 11/9: Pt. not having constipation due to colonic infection that has shifted her toward diarrhea. Having BM's daily, sometimes type 5. Has had diarrhea more than constipation, still had to take linzess 1 time to go after 2 days without a BM. 3/14: to be assessed by pelvic therapist 4/25: yes but 50% of the time is normal consistancy    Time 12    Period Weeks    Status On-going    Target Date 09/20/20      PT LONG TERM GOAL #7   Title Pt. will drive to a meaningful activity to demonstrate increased function and participation    Baseline Pt. has not been able to drive due to decreased LE control 3/134: still not driving at this time    Time 12    Period Weeks    Status On-going    Target Date 09/20/20      PT LONG TERM GOAL #8   Title Pt. will be able to perform 1 hr. of light housework reqiring intermittent standing and walking without increased pain/fatigue >2 pts.    Baseline Pt. becomes fatigued washing fruits/vegetbles in small stages and has fatigue>3 pts. 3/14: able to perform but needs rest breaks 4/25: able to do but is fatiguing    Time 12    Period Weeks    Status Partially Met    Target Date 09/20/20      PT LONG TERM GOAL  #9   TITLE Pt. will be able to attend a full athletic event for her children without Sx increasing by more than 2 pts.    Baseline Pt. having migraine or severe bladder spasms and fatigue and back pain> 3 pt. 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 Patient given resources for foundations for her new diagnosis. Education on plan of care and continuation of research to ensure maximal therapeutic alliance for support performed. Supine and seated interventions performed with patient requiring multiple rest breaks. Patient will benefit from skilled physical therapy to increase strength, stability, and decrease fall risk for improved quality of life    Personal Factors and Comorbidities Comorbidity 3+    Comorbidities neurologic insult with pending diagnosis, multiple recurrent UTI's, BLE weakness, depression    Examination-Activity Limitations Caring  for Others;Carry;Continence;Squat;Stairs;Toileting;Locomotion Level;Lift;Stand    Examination-Participation Restrictions Church;Cleaning;Community Activity;Driving;Interpersonal Relationship;Laundry;Yard Work;Meal Prep    Stability/Clinical Decision Making Unstable/Unpredictable    Rehab Potential Fair    PT Frequency 2x / week    PT Duration 12 weeks    PT Treatment/Interventions ADLs/Self Care Home Management;Aquatic Therapy;Biofeedback;Moist Heat;Electrical Stimulation;Cryotherapy;Traction;Ultrasound;Therapeutic activities;Functional mobility training;Stair training;Gait training;Therapeutic exercise;Balance training;Neuromuscular re-education;Patient/family education;Manual techniques;Dry needling;Scar mobilization;Energy conservation;Taping;Joint Manipulations;Spinal Manipulations;Visual/perceptual remediation/compensation;Passive range of motion    PT Next Visit Plan use balance game for 5 min., ask about stationary bike success/fatigue levels. Ask about taping/re-apply, further suprapubic scar work,  deep-core, scapular tracking/strength, and balance, MFR to lower abdomen, Review pain diary, cupping to medial thighs and upper back, re-assess neural glides on LLE, focus on graded activity, and seated and standing balance/core strength. review I's Y's and T's in prone, thoracic mobility? perform further release and PNF for R>L shoulder blades, MFR around B ilium. continue to work on thoracolumbar rotation ROM and decreasing spasms    PT Home Exercise Plan A day: low back stretch, side-stretch, hamstring stretch, butterfly stretch, hip EXT in prone with knee bent, standing hip ABD/EXT, calf raises. B day: Chest-stretch, Thoracic extensions over a towel roll, seated rows and seated scapular retraction (pull shoulder blades together like "T's") with band, child's pose stretch, self internal TP release (especially when Pelvic floor spasms high), Every day: Pain diary, Posterior pelvic tilts with deep core activation and red band for obliques activation, bow-and-arrow, TENS unit 3+ times per day for 30 min and as needed if pain starts to increase. TENS at ASIS/PSIS at strong tingle for 1 hr. per day. Sleeper stretch PRN, vagal nerve toning    Consulted and Agree with Plan of Care Patient           Patient will benefit from skilled therapeutic intervention in order to improve the following deficits and impairments:  Abnormal gait,Decreased balance,Decreased endurance,Decreased mobility,Difficulty walking,Increased muscle spasms,Impaired sensation,Improper body mechanics,Impaired tone,Decreased activity tolerance,Decreased coordination,Decreased strength,Postural dysfunction,Pain  Visit Diagnosis: Other muscle spasm  Muscle weakness (generalized)  Difficulty in walking, not elsewhere classified     Problem List Patient Active Problem List   Diagnosis Date Noted  . Seizure ([place]) 11/11/2018  . Major depressive disorder, recurrent episode, moderate ([place]) 02/07/2018  . Nephrolithiasis 04/16/2016  .  Numbness 07/28/2015  . Bladder retention 06/23/2015  . Abdominal pain 06/04/2015  . Dizziness 05/18/2015  . Neck pain 05/18/2015  . Complicated migraine 79/39/0300  . Other fatigue 04/28/2015  . Abnormal finding on MRI of brain 04/28/2015  . D (diarrhea) 03/29/2015  . H/O disease 03/29/2015  . Abnormal weight loss 03/29/2015  . Muscle weakness (generalized) 03/14/2015  . Headache, migraine 03/10/2015   Janna Arch, PT, DPT   09/02/2020, 1:08 PM  [place] MAIN [facility] SERVICES [address] [place], [place], [address] Phone: [phone]   Fax:  [phone]  Name: Christine Chavez MRN: [id number] Date of Birth: 1980/08/18

## 2020-09-09 ENCOUNTER — Ambulatory Visit: Payer: BC Managed Care – PPO

## 2020-09-09 ENCOUNTER — Other Ambulatory Visit: Payer: Self-pay

## 2020-09-09 DIAGNOSIS — R262 Difficulty in walking, not elsewhere classified: Secondary | ICD-10-CM

## 2020-09-09 DIAGNOSIS — M62838 Other muscle spasm: Secondary | ICD-10-CM

## 2020-09-09 DIAGNOSIS — M6281 Muscle weakness (generalized): Secondary | ICD-10-CM

## 2020-09-09 NOTE — Therapy (Signed)
[place] MAIN [facility] SERVICES [address] [place], [place], [address] Phone: [phone]   Fax:  [phone]  Physical Therapy Treatment  Patient Details  Name: Christine Chavez MRN: [id number] Date of Birth: [birth year] No data recorded  Encounter Date: 09/09/2020   PT End of Session - 09/09/20 0919    Visit Number 106    Number of Visits 114    Date for PT Re-Evaluation 09/20/20    Authorization Type [employer]; 6 session balance PT    Authorization Time Period 06/28/20-09/20/20    Authorization - Visit Number 6    Authorization - Number of Visits 24    Progress Note Due on Visit 110    PT Start Time 0930    PT Stop Time 5366    PT Time Calculation (min) 44 min    Activity Tolerance Patient tolerated treatment well;No increased pain    Behavior During Therapy WFL for tasks assessed/performed           Past Medical History:  Diagnosis Date  . Complication of anesthesia    ? seizures after anesthesia   . Headache   . Migraines   . Neurogenic bladder   . Renal disorder   . Vision abnormalities     Past Surgical History:  Procedure Laterality Date  . ANTERIOR CRUCIATE LIGAMENT REPAIR  1997  . APPENDECTOMY    . COLONOSCOPY WITH PROPOFOL N/A 11/11/2018   Procedure: COLONOSCOPY WITH PROPOFOL;  Surgeon: [name], MD;  Location: [facility] ENDOSCOPY;  Service: Endoscopy;  Laterality: N/A;  . CYSTOSCOPY WITH STENT PLACEMENT Right 04/17/2016   Procedure: CYSTOSCOPY WITH STENT PLACEMENT;  Surgeon: [name], MD;  Location: [facility] ORS;  Service: Urology;  Laterality: Right;  . ESOPHAGOGASTRODUODENOSCOPY (EGD) WITH PROPOFOL N/A 11/11/2018   Procedure: ESOPHAGOGASTRODUODENOSCOPY (EGD) WITH PROPOFOL;  Surgeon: [name], MD;  Location: [facility] ENDOSCOPY;  Service: Endoscopy;  Laterality: N/A;  . EXPLORATORY LAPAROTOMY  1999  . KIDNEY STONE SURGERY  04/2016  . REVISION UROSTOMY CUTANEOUS    . REVISION UROSTOMY CUTANEOUS  01/10/2018   . SUPRAPUBIC CATHETER PLACEMENT  08/2017  . TONSILLECTOMY      There were no vitals filed for this visit.   Subjective Assessment - 09/09/20 1306    Subjective Patient reports she had a rough week, is having spasms this morning and stomach issues from her antibiotics.    Limitations Lifting;Standing;Walking;House hold activities;Other (comment)    Patient Stated Goals to regain PLOF    Currently in Pain? Yes    Pain Score 6     Pain Location Back    Pain Orientation Lower    Pain Descriptors / Indicators Aching    Pain Type Chronic pain    Pain Onset More than a month ago    Pain Frequency Constant             Treatment: Supine: Heel slides 15x each LE, cues for core activation  LE rotation 10x each LE low back pain relieving  Hamstring stretch on PT shoulder with increasing range of motion with time 60 seconds each LE Piriformis stretch 60 seconds each LE Modified bridge with focus on gluteal activation squeezes with arms crossed10x  BTB around bilateral knees: abduction 15x  BTB around bilateral knees: single limb march with cues for core activation 10x each LE Adduction small green ball squeezes 10x 3 second holds Hamstring curls on swiss ball with stabilization provided to ball 10x TrA activation with swiss ball  in hooklying 12x 3 second holds TrA activation with UE raise with green swiss ball in hooklying 12x 3 second holds   seated: RTB around bilateral LE: alternating LAQ 10x each LE GTB hamstring curl 12x each LE    Pt educated throughout session about proper posture and technique with exercises. Improved exercise technique, movement at target joints, use of target muscles after min to mod verbal, visual, tactile cues  Patient tolerated intervention well with additional muscle tissue lengthening for tone reduction. Education on energy conservation and importance of listening to her body performed. Patient is highly motivated but exhausted due to recent  medical diagnosis as well as illness . Patient will benefit from skilled physical therapy to increase strength                           PT Education - 09/09/20 0919    Education Details exercise technique, body mechanics    Person(s) Educated Patient    Methods Explanation;Demonstration;Tactile cues;Verbal cues    Comprehension Verbalized understanding;Returned demonstration;Verbal cues required;Tactile cues required            PT Short Term Goals - 08/09/20 1229      PT SHORT TERM GOAL #1   Title Patient will demonstrate coordinated diaphragmatic breathing with pelvic tilts to demonstrate improved control of diaphragm and TA, to allow for further strengthening of core musculature and decreased pelvic floor spasm.    Baseline Pt. demonstrates breathing dysfunction and poor PFM coordination evidenced by anal manometry As of 2/22: Pt. continues to have restriction in her diaphragm and near T/L junction but is able to intentionally use diaphragmatic breathing through the available ROM.    Time 5    Period Weeks    Status Achieved    Target Date 04/20/19      PT SHORT TERM GOAL #2   Title Patient will report a reduction in pain to no greater than 6/10 over the prior week to demonstrate symptom improvement.    Baseline Pain is 10/10 at worst, 2/10 at best As of 2/22: Pt. had achieved this goal for 1 week as of 1/21 but may have infection or other insult that caused pain to increase again. As of 4/29: Pain high of 8/10 but has been over-doing her activity over the past week. As of 6/10: Pt. was able to keep pain at 6 or below since prior visit by using TENS to help keep tension lower but has not had a full week to test if it will keep working. As of 7/6: Pt. had met this goal but has a new UTI and it caused pain to spike to 8/10 over the weekend. As of 11/9: Pt. has just finally made progress with long-standing UTI and new POC for treatment so she is still having pain spikes  up to 8/10 but we expect greater improvement over the next few months. As of 12/30: still feels like she is trending in a better direction but over-did it over the holidays and pain spiked to 8/10. No higher than 6/10 over last 2 days, even with PMS cramping. 3/14: 8/10 pain from bladder spasm 4/25: 8/10 due to bladder/uterine irritation    Time 5    Period Weeks    Status On-going    Target Date 08/02/20      PT SHORT TERM GOAL #3   Title Patient will demonstrate HEP x1 in the clinic to demonstrate understanding and proper form to allow  for further improvement.    Baseline Pt. lacks knowledge of therepeutic exercises that can decrease her pain/Sx.    Time 5    Period Weeks    Status Achieved    Target Date 04/20/19      PT SHORT TERM GOAL #4   Title Patient will report consistent use of foot-stool (squatty-potty) for positioning with BM to decrease pain with BM and intra-abdominal pressure.    Baseline Pt. having constipation due to PFM dysfunction    Time 5    Period Weeks    Status Achieved    Target Date 04/20/19             PT Long Term Goals - 08/09/20 0001      PT LONG TERM GOAL #1   Title Pt. will be able to participate in regular ADL's with least restrictive device without pain increasing greater than 2/10    Baseline Pt. limited in her ability to perform household duties by increased pain and fatigue. As of 4/29: Her legs have more endurance with walking before she gets a tremor. Still needs to sit down the majority of the time she is cooking. Still needs back support for prolonged sitting. As of 6/10: Pt. able to do for  ~1 hour before she needs to rest to prevent increased pain, pain is averaging ~ 4-5/10 3/14: able to perform ADLs with less pain, does still have bad days 4/25: requires stops throughout process more fatigue than pain limiting    Time 12    Period Weeks    Status On-going    Target Date 09/20/20      PT LONG TERM GOAL #3   Title Patient will report no  pain with intercourse to demonstrate improved functional ability.    Baseline Pt. Having significant pain with intercourse. As of 4/29: is a little better (20-30%) but is worse on days where overall tension/pain is worse. As of 6/10: can have days where there is not much pain but other days where there is significant pain. As of 11/9: same as previous update. AS of 04/15/20: 25% improved from base-line 3/14: to be asssessed next session by pelvic therapist 4/25: some pain iwth intercouse but not always    Time 12    Period Weeks    Status Partially Met    Target Date 09/20/20      PT LONG TERM GOAL #5   Title Pt. will be able to go 2-3 hours between emptying her bladder without increased pain and empty her bladder fully whether by urostomy, cath, or voluntary release.    Baseline Pt. has a urostomy but continues to have to self-catheterize. Has pain with bladder filling and when using catheter. As of 4/29: is doing better since starting to take the pregabalin andd baclofen but occasionally still has pain with bladder filling. It is bad when she has an infection or over-does activity, better on "normal" days which have been few and far between due to frequent UTI's. As of 6/10: Pt. has "good" and "bad" days but has more days where she can go 2-3 hours without increased pain. As of 04/15/20: able to wait 2 hours, not 3 yet 3/14: somedays able to meet this goals, some days is more frequent 4/25: good day: yes bad day every hour    Time 12    Period Weeks    Status Partially Met    Target Date 11/01/20      PT LONG TERM GOAL #6  Title Patient will report having BM's at least every-other day with consistency between [facility] stool scale 3-5 over the prior week to demonstrate decreased constipation.    Baseline Pt. unable to have regula BM's without medication, manometry shows PFM dysfunction. As of 4/29: still having to use and enema or supository occasionally and still needing linzess. As of 6/10: Pt.  has days where she can empty ok and days where she needs to use an Enema to stimulate a BM, feels that it is just the insertion of the enema, not the solution that does the job. As of 11/9: Pt. not having constipation due to colonic infection that has shifted her toward diarrhea. Having BM's daily, sometimes type 5. Has had diarrhea more than constipation, still had to take linzess 1 time to go after 2 days without a BM. 3/14: to be assessed by pelvic therapist 4/25: yes but 50% of the time is normal consistancy    Time 12    Period Weeks    Status On-going    Target Date 09/20/20      PT LONG TERM GOAL #7   Title Pt. will drive to a meaningful activity to demonstrate increased function and participation    Baseline Pt. has not been able to drive due to decreased LE control 3/134: still not driving at this time    Time 12    Period Weeks    Status On-going    Target Date 09/20/20      PT LONG TERM GOAL #8   Title Pt. will be able to perform 1 hr. of light housework reqiring intermittent standing and walking without increased pain/fatigue >2 pts.    Baseline Pt. becomes fatigued washing fruits/vegetbles in small stages and has fatigue>3 pts. 3/14: able to perform but needs rest breaks 4/25: able to do but is fatiguing    Time 12    Period Weeks    Status Partially Met    Target Date 09/20/20      PT LONG TERM GOAL  #9   TITLE Pt. will be able to attend a full athletic event for her children without Sx increasing by more than 2 pts.    Baseline Pt. having migraine or severe bladder spasms and fatigue and back pain> 3 pt. increase with attendance at athletic events 3/14: has increased pain but is able to attend now, has to use TENS unit 4/25: has not had any games to be able to attempt to go to    Time 12    Period Weeks    Status Partially Met    Target Date 09/20/20                 Plan - 09/09/20 1319    Clinical Impression Statement Patient tolerated intervention well with  additional muscle tissue lengthening for tone reduction. Education on energy conservation and importance of listening to her body performed. Patient is highly motivated but exhausted due to recent medical diagnosis as well as illness . Patient will benefit from skilled physical therapy to increase strength    Personal Factors and Comorbidities Comorbidity 3+    Comorbidities neurologic insult with pending diagnosis, multiple recurrent UTI's, BLE weakness, depression    Examination-Activity Limitations Caring for Others;Carry;Continence;Squat;Stairs;Toileting;Locomotion Level;Lift;Stand    Examination-Participation Restrictions Church;Cleaning;Community Activity;Driving;Interpersonal Relationship;Laundry;Yard Work;Meal Prep    Stability/Clinical Decision Making Unstable/Unpredictable    Rehab Potential Fair    PT Frequency 2x / week    PT Duration 12 weeks    PT  Treatment/Interventions ADLs/Self Care Home Management;Aquatic Therapy;Biofeedback;Moist Heat;Electrical Stimulation;Cryotherapy;Traction;Ultrasound;Therapeutic activities;Functional mobility training;Stair training;Gait training;Therapeutic exercise;Balance training;Neuromuscular re-education;Patient/family education;Manual techniques;Dry needling;Scar mobilization;Energy conservation;Taping;Joint Manipulations;Spinal Manipulations;Visual/perceptual remediation/compensation;Passive range of motion    PT Next Visit Plan use balance game for 5 min., ask about stationary bike success/fatigue levels. Ask about taping/re-apply, further suprapubic scar work, deep-core, scapular tracking/strength, and balance, MFR to lower abdomen, Review pain diary, cupping to medial thighs and upper back, re-assess neural glides on LLE, focus on graded activity, and seated and standing balance/core strength. review I's Y's and T's in prone, thoracic mobility? perform further release and PNF for R>L shoulder blades, MFR around B ilium. continue to work on thoracolumbar  rotation ROM and decreasing spasms    PT Home Exercise Plan A day: low back stretch, side-stretch, hamstring stretch, butterfly stretch, hip EXT in prone with knee bent, standing hip ABD/EXT, calf raises. B day: Chest-stretch, Thoracic extensions over a towel roll, seated rows and seated scapular retraction (pull shoulder blades together like "T's") with band, child's pose stretch, self internal TP release (especially when Pelvic floor spasms high), Every day: Pain diary, Posterior pelvic tilts with deep core activation and red band for obliques activation, bow-and-arrow, TENS unit 3+ times per day for 30 min and as needed if pain starts to increase. TENS at ASIS/PSIS at strong tingle for 1 hr. per day. Sleeper stretch PRN, vagal nerve toning    Consulted and Agree with Plan of Care Patient           Patient will benefit from skilled therapeutic intervention in order to improve the following deficits and impairments:  Abnormal gait,Decreased balance,Decreased endurance,Decreased mobility,Difficulty walking,Increased muscle spasms,Impaired sensation,Improper body mechanics,Impaired tone,Decreased activity tolerance,Decreased coordination,Decreased strength,Postural dysfunction,Pain  Visit Diagnosis: Other muscle spasm  Muscle weakness (generalized)  Difficulty in walking, not elsewhere classified     Problem List Patient Active Problem List   Diagnosis Date Noted  . Seizure ([place]) 11/11/2018  . Major depressive disorder, recurrent episode, moderate ([place]) 02/07/2018  . Nephrolithiasis 04/16/2016  . Numbness 07/28/2015  . Bladder retention 06/23/2015  . Abdominal pain 06/04/2015  . Dizziness 05/18/2015  . Neck pain 05/18/2015  . Complicated migraine 33/58/2518  . Other fatigue 04/28/2015  . Abnormal finding on MRI of brain 04/28/2015  . D (diarrhea) 03/29/2015  . H/O disease 03/29/2015  . Abnormal weight loss 03/29/2015  . Muscle weakness (generalized) 03/14/2015  . Headache,  migraine 03/10/2015   Janna Arch, PT, DPT   09/09/2020, 1:20 PM  [place] MAIN [facility] SERVICES [address] [place], [place], [address] Phone: [phone]   Fax:  [phone]  Name: Christine Chavez MRN: [id number] Date of Birth: 1980/08/05

## 2020-09-16 ENCOUNTER — Ambulatory Visit: Payer: BC Managed Care – PPO | Attending: Gastroenterology

## 2020-09-16 ENCOUNTER — Other Ambulatory Visit: Payer: Self-pay

## 2020-09-16 DIAGNOSIS — M6281 Muscle weakness (generalized): Secondary | ICD-10-CM | POA: Diagnosis present

## 2020-09-16 DIAGNOSIS — M62838 Other muscle spasm: Secondary | ICD-10-CM | POA: Insufficient documentation

## 2020-09-16 DIAGNOSIS — R269 Unspecified abnormalities of gait and mobility: Secondary | ICD-10-CM | POA: Insufficient documentation

## 2020-09-16 DIAGNOSIS — R2689 Other abnormalities of gait and mobility: Secondary | ICD-10-CM | POA: Diagnosis present

## 2020-09-16 DIAGNOSIS — R262 Difficulty in walking, not elsewhere classified: Secondary | ICD-10-CM | POA: Insufficient documentation

## 2020-09-16 NOTE — Therapy (Signed)
[place] MAIN [facility] SERVICES [address] [place], [place], [address] Phone: [phone]   Fax:  [phone]  Physical Therapy Treatment/RECERT  Patient Details  Name: Christine Chavez MRN: [id number] Date of Birth: 08/15/1980 No data recorded  Encounter Date: 09/16/2020   PT End of Session - 09/16/20 1354    Visit Number [id number]    Number of Visits 131    Date for PT Re-Evaluation 12/09/20    Authorization Time Period 06/28/20-09/20/20    Authorization - Visit Number [id number]    Authorization - Number of Visits 24    PT Start Time 0930    PT Stop Time 5284    PT Time Calculation (min) 45 min    Activity Tolerance Patient tolerated treatment well;No increased pain    Behavior During Therapy WFL for tasks assessed/performed           Past Medical History:  Diagnosis Date  . Complication of anesthesia    ? seizures after anesthesia   . Headache   . Migraines   . Neurogenic bladder   . Renal disorder   . Vision abnormalities     Past Surgical History:  Procedure Laterality Date  . ANTERIOR CRUCIATE LIGAMENT REPAIR  1997  . APPENDECTOMY    . COLONOSCOPY WITH PROPOFOL N/A 11/11/2018   Procedure: COLONOSCOPY WITH PROPOFOL;  Surgeon: [name], MD;  Location: [facility] ENDOSCOPY;  Service: Endoscopy;  Laterality: N/A;  . CYSTOSCOPY WITH STENT PLACEMENT Right 04/17/2016   Procedure: CYSTOSCOPY WITH STENT PLACEMENT;  Surgeon: [name], MD;  Location: [facility] ORS;  Service: Urology;  Laterality: Right;  . ESOPHAGOGASTRODUODENOSCOPY (EGD) WITH PROPOFOL N/A 11/11/2018   Procedure: ESOPHAGOGASTRODUODENOSCOPY (EGD) WITH PROPOFOL;  Surgeon: [name], MD;  Location: [facility] ENDOSCOPY;  Service: Endoscopy;  Laterality: N/A;  . EXPLORATORY LAPAROTOMY  1999  . KIDNEY STONE SURGERY  04/2016  . REVISION UROSTOMY CUTANEOUS    . REVISION UROSTOMY CUTANEOUS  01/10/2018  . SUPRAPUBIC CATHETER PLACEMENT  08/2017  . TONSILLECTOMY      There  were no vitals filed for this visit.   Subjective Assessment - 09/16/20 1353    Subjective Patient reports her legs have been affecting her at night keeping her awake. She reports fatigue and feeling of being overwhelmed.    Limitations Lifting;Standing;Walking;House hold activities;Other (comment)    Patient Stated Goals to regain PLOF    Currently in Pain? Yes    Pain Score 5     Pain Location Back    Pain Orientation Lower    Pain Descriptors / Indicators Aching    Pain Type Chronic pain    Pain Onset More than a month ago    Pain Frequency Constant                RECERT Pain VAS: worst pain pelvic spasm 7/10 worst 5/10 back Perform 1 hour of light housework: depends on the day able to do somedays  Attend athletic event: haven't had any athletic events to attend.   Add:  10 MWT 1 min 18 seconds with Rollator  5x STS: 43 seconds with heavy BUE support      Treatment:  Supine with heat pad on back: Bolster under legs: -SAQ 12x each LE with occasional tactile cueing for muscle activation -adduction ball squeeze 15x 3 seconds hold   LE rotation 10x each LE low back pain relieving  Hamstring stretch on PT shoulder with increasing range of motion with time 60  seconds each LE Piriformis stretch 60 seconds each LE Modified bridge with focus on gluteal activation squeezes with arms crossed10x  GTB around bilateral knees: abduction 15x   Pt educated throughout session about proper posture and technique with exercises. Improved exercise technique, movement at target joints, use of target muscles after min to mod verbal, visual, tactile cues  Patient has new diagnosis of Hereditary Spastic Paraplegia and would benefit from continued skilled physical therapy in light of new diagnosis. New goals addressing functional mobility added with patient demonstrating excellent motivation. Additional education on and request for light wheelchair from physician sent to assist with patient  ADL performance.Patient is highly motivated but exhausted due to recent medical diagnosis as well as illness  Patient will benefit from skilled physical therapy to increase strength, stability, and decrease fall risk for improved quality of life              PT Education - 09/16/20 1353    Education Details exercise technique, body mechanics    Person(s) Educated Patient    Methods Explanation;Demonstration;Tactile cues;Verbal cues    Comprehension Verbalized understanding;Returned demonstration;Verbal cues required;Tactile cues required            PT Short Term Goals - 09/16/20 1400      PT SHORT TERM GOAL #1   Title Patient will demonstrate coordinated diaphragmatic breathing with pelvic tilts to demonstrate improved control of diaphragm and TA, to allow for further strengthening of core musculature and decreased pelvic floor spasm.    Baseline Pt. demonstrates breathing dysfunction and poor PFM coordination evidenced by anal manometry As of 2/22: Pt. continues to have restriction in her diaphragm and near T/L junction but is able to intentionally use diaphragmatic breathing through the available ROM.    Time 5    Period Weeks    Status Achieved    Target Date 04/20/19      PT SHORT TERM GOAL #2   Title Patient will report a reduction in pain to no greater than 6/10 over the prior week to demonstrate symptom improvement.    Baseline Pain is 10/10 at worst, 2/10 at best As of 2/22: Pt. had achieved this goal for 1 week as of 1/21 but may have infection or other insult that caused pain to increase again. As of 4/29: Pain high of 8/10 but has been over-doing her activity over the past week. As of 6/10: Pt. was able to keep pain at 6 or below since prior visit by using TENS to help keep tension lower but has not had a full week to test if it will keep working. As of 7/6: Pt. had met this goal but has a new UTI and it caused pain to spike to 8/10 over the weekend. As of 11/9: Pt. has  just finally made progress with long-standing UTI and new POC for treatment so she is still having pain spikes up to 8/10 but we expect greater improvement over the next few months. As of 12/30: still feels like she is trending in a better direction but over-did it over the holidays and pain spiked to 8/10. No higher than 6/10 over last 2 days, even with PMS cramping. 3/14: 8/10 pain from bladder spasm 4/25: 8/10 due to bladder/uterine irritation 6/2: pelvic 7/10; back 5/10    Time 5    Period Weeks    Status Partially Met    Target Date 10/21/20      PT SHORT TERM GOAL #3   Title Patient will  demonstrate HEP x1 in the clinic to demonstrate understanding and proper form to allow for further improvement.    Baseline Pt. lacks knowledge of therepeutic exercises that can decrease her pain/Sx.    Time 5    Period Weeks    Status Achieved    Target Date 04/20/19      PT SHORT TERM GOAL #4   Title Patient will report consistent use of foot-stool (squatty-potty) for positioning with BM to decrease pain with BM and intra-abdominal pressure.    Baseline Pt. having constipation due to PFM dysfunction    Time 5    Period Weeks    Status Achieved    Target Date 04/20/19             PT Long Term Goals - 09/16/20 0001      PT LONG TERM GOAL #1   Title Pt. will be able to participate in regular ADL's with least restrictive device without pain increasing greater than 2/10    Baseline Pt. limited in her ability to perform household duties by increased pain and fatigue. As of 4/29: Her legs have more endurance with walking before she gets a tremor. Still needs to sit down the majority of the time she is cooking. Still needs back support for prolonged sitting. As of 6/10: Pt. able to do for  ~1 hour before she needs to rest to prevent increased pain, pain is averaging ~ 4-5/10 3/14: able to perform ADLs with less pain, does still have bad days 4/25: requires stops throughout process more fatigue than  pain limiting 6/2: vas of 7/10 in pelvis occasionally    Time 12    Period Weeks    Status On-going    Target Date 12/09/20      PT LONG TERM GOAL #2   Title Patient will increase 10 meter walk test to <30 seconds as to improve gait speed for better community ambulation and to reduce fall risk.    Baseline 6/2: 1 min 18 seconds with rollator    Time 12    Period Weeks    Status New    Target Date 12/09/20      PT LONG TERM GOAL #3   Title Patient (< 28 years old) will complete five times sit to stand test in < 20 seconds indicating an increased LE strength and improved balance.    Baseline 6/2: 43 seconds with heavy BUE support    Time 12    Period Weeks    Status New    Target Date 12/09/20      PT LONG TERM GOAL #4   Title Pt. will be able to perform 1 hr. of light housework reqiring intermittent standing and walking without increased pain/fatigue >2 pts.    Baseline Pt. becomes fatigued washing fruits/vegetbles in small stages and has fatigue>3 pts. 3/14: able to perform but needs rest breaks 4/25: able to do but is fatiguing 6/2: able to perform on good days; unable to perform on bad days    Time 12    Period Weeks    Status Partially Met    Target Date 12/09/20      PT LONG TERM GOAL #5   Title Pt. will be able to go 2-3 hours between emptying her bladder without increased pain and empty her bladder fully whether by urostomy, cath, or voluntary release.    Baseline Pt. has a urostomy but continues to have to self-catheterize. Has pain with bladder filling and when using  catheter. As of 4/29: is doing better since starting to take the pregabalin andd baclofen but occasionally still has pain with bladder filling. It is bad when she has an infection or over-does activity, better on "normal" days which have been few and far between due to frequent UTI's. As of 6/10: Pt. has "good" and "bad" days but has more days where she can go 2-3 hours without increased pain. As of 04/15/20: able  to wait 2 hours, not 3 yet 3/14: somedays able to meet this goals, some days is more frequent 4/25: good day: yes bad day every hour    Time 12    Period Weeks    Status Partially Met    Target Date 12/09/20      PT LONG TERM GOAL #6   Title Pt. will be able to attend a full athletic event for her children without Sx increasing by more than 2 pts.    Baseline Pt. having migraine or severe bladder spasms and fatigue and back pain> 3 pt. increase with attendance at athletic events 3/14: has increased pain but is able to attend now, has to use TENS unit 4/25: has not had any games to be able to attempt to go to 6/2: no games due to summer time    Time 12    Period Weeks    Status On-going    Target Date 12/09/20                 Plan - 09/16/20 1356    Clinical Impression Statement Patient has new diagnosis of Hereditary Spastic Paraplegia and would benefit from continued skilled physical therapy in light of new diagnosis. New goals addressing functional mobility added with patient demonstrating excellent motivation. Additional education on and request for light wheelchair from physician sent to assist with patient ADL performance.Patient is highly motivated but exhausted due to recent medical diagnosis as well as illness  Patient will benefit from skilled physical therapy to increase strength, stability, and decrease fall risk for improved quality of life    Personal Factors and Comorbidities Comorbidity 3+;Transportation;Time since onset of injury/illness/exacerbation;Finances    Comorbidities neurologic insult with pending diagnosis, multiple recurrent UTI's, BLE weakness, depression    Examination-Activity Limitations Caring for Others;Carry;Continence;Squat;Stairs;Toileting;Locomotion Level;Lift;Stand;Bed Mobility;Bend;Reach Overhead;Sit;Dressing;Transfers    Examination-Participation Restrictions Church;Cleaning;Community Activity;Driving;Interpersonal Relationship;Laundry;[place] [employer]  Work;Meal Prep;School;Volunteer    Stability/Clinical Decision Making Unstable/Unpredictable    Clinical Decision Making High    Rehab Potential Fair    PT Frequency 2x / week    PT Duration 12 weeks    PT Treatment/Interventions ADLs/Self Care Home Management;Aquatic Therapy;Biofeedback;Moist Heat;Electrical Stimulation;Cryotherapy;Traction;Ultrasound;Therapeutic activities;Functional mobility training;Stair training;Gait training;Therapeutic exercise;Balance training;Neuromuscular re-education;Patient/family education;Manual techniques;Dry needling;Scar mobilization;Energy conservation;Taping;Joint Manipulations;Spinal Manipulations;Visual/perceptual remediation/compensation;Passive range of motion;Wheelchair mobility training;DME Instruction;Orthotic Fit/Training    PT Next Visit Plan transfers, stabilization in standing, core activation    PT Home Exercise Plan A day: low back stretch, side-stretch, hamstring stretch, butterfly stretch, hip EXT in prone with knee bent, standing hip ABD/EXT, calf raises. B day: Chest-stretch, Thoracic extensions over a towel roll, seated rows and seated scapular retraction (pull shoulder blades together like "T's") with band, child's pose stretch, self internal TP release (especially when Pelvic floor spasms high), Every day: Pain diary, Posterior pelvic tilts with deep core activation and red band for obliques activation, bow-and-arrow, TENS unit 3+ times per day for 30 min and as needed if pain starts to increase. TENS at ASIS/PSIS at strong tingle for 1 hr. per day. Sleeper stretch PRN, vagal nerve toning  Consulted and Agree with Plan of Care Patient           Patient will benefit from skilled therapeutic intervention in order to improve the following deficits and impairments:  Abnormal gait,Decreased balance,Decreased endurance,Decreased mobility,Difficulty walking,Increased muscle spasms,Impaired sensation,Improper body mechanics,Impaired tone,Decreased  activity tolerance,Decreased coordination,Decreased strength,Postural dysfunction,Pain,Impaired flexibility  Visit Diagnosis: Other muscle spasm  Muscle weakness (generalized)  Difficulty in walking, not elsewhere classified     Problem List Patient Active Problem List   Diagnosis Date Noted  . Seizure ([place]) 11/11/2018  . Major depressive disorder, recurrent episode, moderate ([place]) 02/07/2018  . Nephrolithiasis 04/16/2016  . Numbness 07/28/2015  . Bladder retention 06/23/2015  . Abdominal pain 06/04/2015  . Dizziness 05/18/2015  . Neck pain 05/18/2015  . Complicated migraine 85/49/6565  . Other fatigue 04/28/2015  . Abnormal finding on MRI of brain 04/28/2015  . D (diarrhea) 03/29/2015  . H/O disease 03/29/2015  . Abnormal weight loss 03/29/2015  . Muscle weakness (generalized) 03/14/2015  . Headache, migraine 03/10/2015   Janna Arch, PT, DPT   09/16/2020, 2:06 PM  [place] MAIN [facility] SERVICES [address] [place], [place], [address] Phone: [phone]   Fax:  [phone]  Name: Tarri Guilfoil MRN: [id number] Date of Birth: 1980-07-13

## 2020-09-20 ENCOUNTER — Other Ambulatory Visit: Payer: Self-pay

## 2020-09-20 ENCOUNTER — Ambulatory Visit: Payer: BC Managed Care – PPO

## 2020-09-20 DIAGNOSIS — M62838 Other muscle spasm: Secondary | ICD-10-CM | POA: Diagnosis not present

## 2020-09-20 DIAGNOSIS — M6281 Muscle weakness (generalized): Secondary | ICD-10-CM

## 2020-09-20 DIAGNOSIS — R262 Difficulty in walking, not elsewhere classified: Secondary | ICD-10-CM

## 2020-09-20 NOTE — Therapy (Signed)
[place] MAIN [facility] SERVICES [address] [place], [place], [address] Phone: [phone]   Fax:  [phone]  Physical Therapy Treatment  Patient Details  Name: Christine Chavez MRN: [id number] Date of Birth: 08/26/1980 No data recorded  Encounter Date: 09/20/2020   PT End of Session - 09/20/20 1012    Visit Number 108    Number of Visits 131    Date for PT Re-Evaluation 12/09/20    Authorization Time Period 06/28/20-09/20/20    Authorization - Visit Number [id number]    Authorization - Number of Visits 24    PT Start Time 3546    PT Stop Time 1100    PT Time Calculation (min) 46 min    Activity Tolerance Patient tolerated treatment well;No increased pain    Behavior During Therapy WFL for tasks assessed/performed           Past Medical History:  Diagnosis Date  . Complication of anesthesia    ? seizures after anesthesia   . Headache   . Migraines   . Neurogenic bladder   . Renal disorder   . Vision abnormalities     Past Surgical History:  Procedure Laterality Date  . ANTERIOR CRUCIATE LIGAMENT REPAIR  1997  . APPENDECTOMY    . COLONOSCOPY WITH PROPOFOL N/A 11/11/2018   Procedure: COLONOSCOPY WITH PROPOFOL;  Surgeon: [name], MD;  Location: [facility] ENDOSCOPY;  Service: Endoscopy;  Laterality: N/A;  . CYSTOSCOPY WITH STENT PLACEMENT Right 04/17/2016   Procedure: CYSTOSCOPY WITH STENT PLACEMENT;  Surgeon: [name], MD;  Location: [facility] ORS;  Service: Urology;  Laterality: Right;  . ESOPHAGOGASTRODUODENOSCOPY (EGD) WITH PROPOFOL N/A 11/11/2018   Procedure: ESOPHAGOGASTRODUODENOSCOPY (EGD) WITH PROPOFOL;  Surgeon: [name], MD;  Location: [facility] ENDOSCOPY;  Service: Endoscopy;  Laterality: N/A;  . EXPLORATORY LAPAROTOMY  1999  . KIDNEY STONE SURGERY  04/2016  . REVISION UROSTOMY CUTANEOUS    . REVISION UROSTOMY CUTANEOUS  01/10/2018  . SUPRAPUBIC CATHETER PLACEMENT  08/2017  . TONSILLECTOMY      There were no  vitals filed for this visit.   Subjective Assessment - 09/20/20 1242    Subjective Patient reports having a rough weekend. Has been feeling very fatigued. No falls but is having intermittent pain that is gone this morning from her shin.    Limitations Lifting;Standing;Walking;House hold activities;Other (comment)    Patient Stated Goals to regain PLOF    Currently in Pain? Yes    Pain Score 6     Pain Location Back    Pain Orientation Lower    Pain Descriptors / Indicators Aching    Pain Type Chronic pain    Pain Onset More than a month ago    Pain Frequency Constant                   Treatment:  In // bars: close CGA:    Forwards ambulation 2x in // bars. Cues for sequencing, foot placement. Intermittent stabilization provided to LE's.  GTB hip flexion with stabilization to LLE to reduce knee buckling 10x each LE; heavy BUE support  Hedgehog taps 8x each LE, stabilization provided to stance leg, BUE support.  Lateral step into GTB  theraband and back; very challenging 8x each LE;   lateral stepping 2x length of // bars, with close CGA and no LOB  Supine:  Adductor lengthening with knee extended 45 seconds each LE LE rotation 10x each LE low back pain relieving  Hamstring stretch on PT shoulder with increasing range of motion with time 60 seconds each LE Piriformis stretch 60 seconds each LE Modified bridge with focus on gluteal activation squeezes with arms crossed10x       Pt educated throughout session about proper posture and technique with exercises. Improved exercise technique, movement at target joints, use of target muscles after min to mod verbal, visual, tactile cues    Patient is highly motivated throughout physical therapy session. She is challenged with maintaining prolonged standing as increased tremors reduce stabilization. Adductor tone noted in supine interventions that reduced with prolonged hold. Education on energy conservation and importance of  listening to her body performed. Patient is highly motivated but exhausted due to recent medical diagnosis as well as illness . Patient will benefit from skilled physical therapy to increase strength                  PT Education - 09/20/20 1012    Education Details exercise technique, body mechanics    Person(s) Educated Patient    Methods Explanation;Demonstration;Tactile cues;Verbal cues    Comprehension Verbalized understanding;Returned demonstration;Verbal cues required;Tactile cues required            PT Short Term Goals - 09/16/20 1400      PT SHORT TERM GOAL #1   Title Patient will demonstrate coordinated diaphragmatic breathing with pelvic tilts to demonstrate improved control of diaphragm and TA, to allow for further strengthening of core musculature and decreased pelvic floor spasm.    Baseline Pt. demonstrates breathing dysfunction and poor PFM coordination evidenced by anal manometry As of 2/22: Pt. continues to have restriction in her diaphragm and near T/L junction but is able to intentionally use diaphragmatic breathing through the available ROM.    Time 5    Period Weeks    Status Achieved    Target Date 04/20/19      PT SHORT TERM GOAL #2   Title Patient will report a reduction in pain to no greater than 6/10 over the prior week to demonstrate symptom improvement.    Baseline Pain is 10/10 at worst, 2/10 at best As of 2/22: Pt. had achieved this goal for 1 week as of 1/21 but may have infection or other insult that caused pain to increase again. As of 4/29: Pain high of 8/10 but has been over-doing her activity over the past week. As of 6/10: Pt. was able to keep pain at 6 or below since prior visit by using TENS to help keep tension lower but has not had a full week to test if it will keep working. As of 7/6: Pt. had met this goal but has a new UTI and it caused pain to spike to 8/10 over the weekend. As of 11/9: Pt. has just finally made progress with  long-standing UTI and new POC for treatment so she is still having pain spikes up to 8/10 but we expect greater improvement over the next few months. As of 12/30: still feels like she is trending in a better direction but over-did it over the holidays and pain spiked to 8/10. No higher than 6/10 over last 2 days, even with PMS cramping. 3/14: 8/10 pain from bladder spasm 4/25: 8/10 due to bladder/uterine irritation 6/2: pelvic 7/10; back 5/10    Time 5    Period Weeks    Status Partially Met    Target Date 10/21/20      PT SHORT TERM GOAL #3   Title Patient  will demonstrate HEP x1 in the clinic to demonstrate understanding and proper form to allow for further improvement.    Baseline Pt. lacks knowledge of therepeutic exercises that can decrease her pain/Sx.    Time 5    Period Weeks    Status Achieved    Target Date 04/20/19      PT SHORT TERM GOAL #4   Title Patient will report consistent use of foot-stool (squatty-potty) for positioning with BM to decrease pain with BM and intra-abdominal pressure.    Baseline Pt. having constipation due to PFM dysfunction    Time 5    Period Weeks    Status Achieved    Target Date 04/20/19             PT Long Term Goals - 09/16/20 0001      PT LONG TERM GOAL #1   Title Pt. will be able to participate in regular ADL's with least restrictive device without pain increasing greater than 2/10    Baseline Pt. limited in her ability to perform household duties by increased pain and fatigue. As of 4/29: Her legs have more endurance with walking before she gets a tremor. Still needs to sit down the majority of the time she is cooking. Still needs back support for prolonged sitting. As of 6/10: Pt. able to do for  ~1 hour before she needs to rest to prevent increased pain, pain is averaging ~ 4-5/10 3/14: able to perform ADLs with less pain, does still have bad days 4/25: requires stops throughout process more fatigue than pain limiting 6/2: vas of 7/10 in  pelvis occasionally    Time 12    Period Weeks    Status On-going    Target Date 12/09/20      PT LONG TERM GOAL #2   Title Patient will increase 10 meter walk test to <30 seconds as to improve gait speed for better community ambulation and to reduce fall risk.    Baseline 6/2: 1 min 18 seconds with rollator    Time 12    Period Weeks    Status New    Target Date 12/09/20      PT LONG TERM GOAL #3   Title Patient (< 70 years old) will complete five times sit to stand test in < 20 seconds indicating an increased LE strength and improved balance.    Baseline 6/2: 43 seconds with heavy BUE support    Time 12    Period Weeks    Status New    Target Date 12/09/20      PT LONG TERM GOAL #4   Title Pt. will be able to perform 1 hr. of light housework reqiring intermittent standing and walking without increased pain/fatigue >2 pts.    Baseline Pt. becomes fatigued washing fruits/vegetbles in small stages and has fatigue>3 pts. 3/14: able to perform but needs rest breaks 4/25: able to do but is fatiguing 6/2: able to perform on good days; unable to perform on bad days    Time 12    Period Weeks    Status Partially Met    Target Date 12/09/20      PT LONG TERM GOAL #5   Title Pt. will be able to go 2-3 hours between emptying her bladder without increased pain and empty her bladder fully whether by urostomy, cath, or voluntary release.    Baseline Pt. has a urostomy but continues to have to self-catheterize. Has pain with bladder filling and when  using catheter. As of 4/29: is doing better since starting to take the pregabalin andd baclofen but occasionally still has pain with bladder filling. It is bad when she has an infection or over-does activity, better on "normal" days which have been few and far between due to frequent UTI's. As of 6/10: Pt. has "good" and "bad" days but has more days where she can go 2-3 hours without increased pain. As of 04/15/20: able to wait 2 hours, not 3 yet 3/14:  somedays able to meet this goals, some days is more frequent 4/25: good day: yes bad day every hour    Time 12    Period Weeks    Status Partially Met    Target Date 12/09/20      PT LONG TERM GOAL #6   Title Pt. will be able to attend a full athletic event for her children without Sx increasing by more than 2 pts.    Baseline Pt. having migraine or severe bladder spasms and fatigue and back pain> 3 pt. increase with attendance at athletic events 3/14: has increased pain but is able to attend now, has to use TENS unit 4/25: has not had any games to be able to attempt to go to 6/2: no games due to summer time    Time 12    Period Weeks    Status On-going    Target Date 12/09/20                 Plan - 09/20/20 1252    Clinical Impression Statement Patient is highly motivated throughout physical therapy session. She is challenged with maintaining prolonged standing as increased tremors reduce stabilization. Adductor tone noted in supine interventions that reduced with prolonged hold. Education on energy conservation and importance of listening to her body performed. Patient is highly motivated but exhausted due to recent medical diagnosis as well as illness . Patient will benefit from skilled physical therapy to increase strength    Personal Factors and Comorbidities Comorbidity 3+;Transportation;Time since onset of injury/illness/exacerbation;Finances    Comorbidities neurologic insult with pending diagnosis, multiple recurrent UTI's, BLE weakness, depression    Examination-Activity Limitations Caring for Others;Carry;Continence;Squat;Stairs;Toileting;Locomotion Level;Lift;Stand;Bed Mobility;Bend;Reach Overhead;Sit;Dressing;Transfers    Examination-Participation Restrictions Church;Cleaning;Community Activity;Driving;Interpersonal Relationship;Laundry;[employer] Work;Meal Prep;School;Volunteer    Stability/Clinical Decision Making Unstable/Unpredictable    Rehab Potential Fair    PT Frequency  2x / week    PT Duration 12 weeks    PT Treatment/Interventions ADLs/Self Care Home Management;Aquatic Therapy;Biofeedback;Moist Heat;Electrical Stimulation;Cryotherapy;Traction;Ultrasound;Therapeutic activities;Functional mobility training;Stair training;Gait training;Therapeutic exercise;Balance training;Neuromuscular re-education;Patient/family education;Manual techniques;Dry needling;Scar mobilization;Energy conservation;Taping;Joint Manipulations;Spinal Manipulations;Visual/perceptual remediation/compensation;Passive range of motion;Wheelchair mobility training;DME Instruction;Orthotic Fit/Training    PT Next Visit Plan transfers, stabilization in standing, core activation    PT Home Exercise Plan A day: low back stretch, side-stretch, hamstring stretch, butterfly stretch, hip EXT in prone with knee bent, standing hip ABD/EXT, calf raises. B day: Chest-stretch, Thoracic extensions over a towel roll, seated rows and seated scapular retraction (pull shoulder blades together like "T's") with band, child's pose stretch, self internal TP release (especially when Pelvic floor spasms high), Every day: Pain diary, Posterior pelvic tilts with deep core activation and red band for obliques activation, bow-and-arrow, TENS unit 3+ times per day for 30 min and as needed if pain starts to increase. TENS at ASIS/PSIS at strong tingle for 1 hr. per day. Sleeper stretch PRN, vagal nerve toning    Consulted and Agree with Plan of Care Patient  Patient will benefit from skilled therapeutic intervention in order to improve the following deficits and impairments:  Abnormal gait,Decreased balance,Decreased endurance,Decreased mobility,Difficulty walking,Increased muscle spasms,Impaired sensation,Improper body mechanics,Impaired tone,Decreased activity tolerance,Decreased coordination,Decreased strength,Postural dysfunction,Pain,Impaired flexibility  Visit Diagnosis: Other muscle spasm  Muscle weakness  (generalized)  Difficulty in walking, not elsewhere classified     Problem List Patient Active Problem List   Diagnosis Date Noted  . Seizure ([place]) 11/11/2018  . Major depressive disorder, recurrent episode, moderate ([place]) 02/07/2018  . Nephrolithiasis 04/16/2016  . Numbness 07/28/2015  . Bladder retention 06/23/2015  . Abdominal pain 06/04/2015  . Dizziness 05/18/2015  . Neck pain 05/18/2015  . Complicated migraine 34/62/1947  . Other fatigue 04/28/2015  . Abnormal finding on MRI of brain 04/28/2015  . D (diarrhea) 03/29/2015  . H/O disease 03/29/2015  . Abnormal weight loss 03/29/2015  . Muscle weakness (generalized) 03/14/2015  . Headache, migraine 03/10/2015   Janna Arch, PT, DPT   09/20/2020, 12:54 PM  [place] MAIN [facility] [address] [place], [place], [address] Phone: [phone]   Fax:  [phone]  Name: Asuzena Weis MRN: [id number] Date of Birth: January 26, 1981

## 2020-09-23 ENCOUNTER — Other Ambulatory Visit: Payer: Self-pay

## 2020-09-23 ENCOUNTER — Ambulatory Visit: Payer: BC Managed Care – PPO

## 2020-09-23 DIAGNOSIS — R262 Difficulty in walking, not elsewhere classified: Secondary | ICD-10-CM

## 2020-09-23 DIAGNOSIS — R269 Unspecified abnormalities of gait and mobility: Secondary | ICD-10-CM

## 2020-09-23 DIAGNOSIS — M6281 Muscle weakness (generalized): Secondary | ICD-10-CM

## 2020-09-23 DIAGNOSIS — R2689 Other abnormalities of gait and mobility: Secondary | ICD-10-CM

## 2020-09-23 DIAGNOSIS — M62838 Other muscle spasm: Secondary | ICD-10-CM | POA: Diagnosis not present

## 2020-09-23 NOTE — Therapy (Signed)
[place] [facility]IN [facility] SERVICES [address] [place], [place], [address] Phone: [phone]   Fax:  [phone]  Physical Therapy Treatment  Patient Details  Name: Christine Chavez MRN: [id number] Date of Birth: 10/03/1980 No data recorded  Encounter Date: 09/23/2020   PT End of Session - 09/23/20 1336     Visit Number 109    Number of Visits 131    Date for PT Re-Evaluation 12/09/20    Authorization Time Period 06/28/20-09/20/20    Authorization - Visit Number 9    Authorization - Number of Visits 24    PT Start Time 1017    PT Stop Time 1100    PT Time Calculation (min) 43 min    Equipment Utilized During Treatment Gait belt    Activity Tolerance Patient tolerated treatment well;No increased pain    Behavior During Therapy WFL for tasks assessed/performed             Past Medical History:  Diagnosis Date   Complication of anesthesia    ? seizures after anesthesia    Headache    Migraines    Neurogenic bladder    Renal disorder    Vision abnormalities     Past Surgical History:  Procedure Laterality Date   ANTERIOR CRUCIATE LIGAMENT REPAIR  1997   APPENDECTOMY     COLONOSCOPY WITH PROPOFOL N/A 11/11/2018   Procedure: COLONOSCOPY WITH PROPOFOL;  Surgeon: [name], MD;  Location: [facility] ENDOSCOPY;  Service: Endoscopy;  Laterality: N/A;   CYSTOSCOPY WITH STENT PLACEMENT Right 04/17/2016   Procedure: CYSTOSCOPY WITH STENT PLACEMENT;  Surgeon: [name], MD;  Location: [facility] ORS;  Service: Urology;  Laterality: Right;   ESOPHAGOGASTRODUODENOSCOPY (EGD) WITH PROPOFOL N/A 11/11/2018   Procedure: ESOPHAGOGASTRODUODENOSCOPY (EGD) WITH PROPOFOL;  Surgeon: [name], MD;  Location: [facility] ENDOSCOPY;  Service: Endoscopy;  Laterality: N/A;   EXPLORATORY LAPAROTOMY  1999   KIDNEY STONE SURGERY  04/2016   REVISION UROSTOMY CUTANEOUS     REVISION UROSTOMY CUTANEOUS  01/10/2018   SUPRAPUBIC CATHETER PLACEMENT  08/2017    TONSILLECTOMY      There were no vitals filed for this visit.   Subjective Assessment - 09/23/20 1334     Subjective Patient reports feeling pretty rough physically today but agreeable to do what she can in PT today.    Limitations Lifting;Standing;Walking;House hold activities;Other (comment)    Patient Stated Goals to regain PLOF    Pain Onset More than a month ago               Treatment:  In // bars: close CGA:    Forwards ambulation - down and back  in // bars  x 2 trials with BUE support on rails. VC for sequencing/heelstrike/step length. Patient with slow cadence yet short reciprocal steps and only intermittent hyperextension of  knees. She was shaky yet able to  complete well with CGA today.   GTB hip flexion with stabilization to LLE to reduce knee buckling 15 x each LE; heavy BUE support - Patient able to raise knee into resistance band well without any stance leg buckling.   Hedgehog taps 8x each LE, No physical assistance with stabilization to stance leg, BUE support.  Lateral step over hedgehog and back with heavy BUE support; very challenging 12 reps each LE;   lateral sidestepping 2x length of // bars, with close CGA and no LOB- short steps with heavy UE support.   Pt educated throughout  session about proper posture and technique with exercises. Improved exercise technique, movement at target joints, use of target muscles after min to mod verbal, visual, tactile cues    Clinical impression: Patient able to demo improved gait distance in //bars with less physical assist with stabilization of legs. She was motivated and able to stand well and complete all activities with VC for technique, BUE Support for support and less overall physical assist. She is limited only by overall fatigue today. Reviewed previous education on energy conservation and patient verbalized understanding. Patient will continue to benefit from skilled physical therapy to increase strength,  Functional mobility and activity tolerance for optimal functional independence in home and community and improved quality of life.                             PT Education - 09/23/20 1335     Education Details specific exercise form    Person(s) Educated Patient    Methods Explanation;Demonstration;Tactile cues;Verbal cues    Comprehension Verbalized understanding;Returned demonstration;Verbal cues required;Tactile cues required;Need further instruction              PT Short Term Goals - 09/16/20 1400       PT SHORT TERM GOAL #1   Title Patient will demonstrate coordinated diaphragmatic breathing with pelvic tilts to demonstrate improved control of diaphragm and TA, to allow for further strengthening of core musculature and decreased pelvic floor spasm.    Baseline Pt. demonstrates breathing dysfunction and poor PFM coordination evidenced by anal manometry As of 2/22: Pt. continues to have restriction in her diaphragm and near T/L junction but is able to intentionally use diaphragmatic breathing through the available ROM.    Time 5    Period Weeks    Status Achieved    Target Date 04/20/19      PT SHORT TERM GOAL #2   Title Patient will report a reduction in pain to no greater than 6/10 over the prior week to demonstrate symptom improvement.    Baseline Pain is 10/10 at worst, 2/10 at best As of 2/22: Pt. had achieved this goal for 1 week as of 1/21 but may have infection or other insult that caused pain to increase again. As of 4/29: Pain high of 8/10 but has been over-doing her activity over the past week. As of 6/10: Pt. was able to keep pain at 6 or below since prior visit by using TENS to help keep tension lower but has not had a full week to test if it will keep working. As of 7/6: Pt. had met this goal but has a new UTI and it caused pain to spike to 8/10 over the weekend. As of 11/9: Pt. has just finally made progress with long-standing UTI and new POC for  treatment so she is still having pain spikes up to 8/10 but we expect greater improvement over the next few months. As of 12/30: still feels like she is trending in a better direction but over-did it over the holidays and pain spiked to 8/10. No higher than 6/10 over last 2 days, even with PMS cramping. 3/14: 8/10 pain from bladder spasm 4/25: 8/10 due to bladder/uterine irritation 6/2: pelvic 7/10; back 5/10    Time 5    Period Weeks    Status Partially Met    Target Date 10/21/20      PT SHORT TERM GOAL #3   Title Patient will demonstrate HEP  x1 in the clinic to demonstrate understanding and proper form to allow for further improvement.    Baseline Pt. lacks knowledge of therepeutic exercises that can decrease her pain/Sx.    Time 5    Period Weeks    Status Achieved    Target Date 04/20/19      PT SHORT TERM GOAL #4   Title Patient will report consistent use of foot-stool (squatty-potty) for positioning with BM to decrease pain with BM and intra-abdominal pressure.    Baseline Pt. having constipation due to PFM dysfunction    Time 5    Period Weeks    Status Achieved    Target Date 04/20/19               PT Long Term Goals - 09/16/20 0001       PT LONG TERM GOAL #1   Title Pt. will be able to participate in regular ADL's with least restrictive device without pain increasing greater than 2/10    Baseline Pt. limited in her ability to perform household duties by increased pain and fatigue. As of 4/29: Her legs have more endurance with walking before she gets a tremor. Still needs to sit down the majority of the time she is cooking. Still needs back support for prolonged sitting. As of 6/10: Pt. able to do for  ~1 hour before she needs to rest to prevent increased pain, pain is averaging ~ 4-5/10 3/14: able to perform ADLs with less pain, does still have bad days 4/25: requires stops throughout process more fatigue than pain limiting 6/2: vas of 7/10 in pelvis occasionally    Time  12    Period Weeks    Status On-going    Target Date 12/09/20      PT LONG TERM GOAL #2   Title Patient will increase 10 meter walk test to <30 seconds as to improve gait speed for better community ambulation and to reduce fall risk.    Baseline 6/2: 1 min 18 seconds with rollator    Time 12    Period Weeks    Status New    Target Date 12/09/20      PT LONG TERM GOAL #3   Title Patient (< 81 years old) will complete five times sit to stand test in < 20 seconds indicating an increased LE strength and improved balance.    Baseline 6/2: 43 seconds with heavy BUE support    Time 12    Period Weeks    Status New    Target Date 12/09/20      PT LONG TERM GOAL #4   Title Pt. will be able to perform 1 hr. of light housework reqiring intermittent standing and walking without increased pain/fatigue >2 pts.    Baseline Pt. becomes fatigued washing fruits/vegetbles in small stages and has fatigue>3 pts. 3/14: able to perform but needs rest breaks 4/25: able to do but is fatiguing 6/2: able to perform on good days; unable to perform on bad days    Time 12    Period Weeks    Status Partially Met    Target Date 12/09/20      PT LONG TERM GOAL #5   Title Pt. will be able to go 2-3 hours between emptying her bladder without increased pain and empty her bladder fully whether by urostomy, cath, or voluntary release.    Baseline Pt. has a urostomy but continues to have to self-catheterize. Has pain with bladder filling and when  using catheter. As of 4/29: is doing better since starting to take the pregabalin andd baclofen but occasionally still has pain with bladder filling. It is bad when she has an infection or over-does activity, better on "normal" days which have been few and far between due to frequent UTI's. As of 6/10: Pt. has "good" and "bad" days but has more days where she can go 2-3 hours without increased pain. As of 04/15/20: able to wait 2 hours, not 3 yet 3/14: somedays able to meet this  goals, some days is more frequent 4/25: good day: yes bad day every hour    Time 12    Period Weeks    Status Partially Met    Target Date 12/09/20      PT LONG TERM GOAL #6   Title Pt. will be able to attend a full athletic event for her children without Sx increasing by more than 2 pts.    Baseline Pt. having migraine or severe bladder spasms and fatigue and back pain> 3 pt. increase with attendance at athletic events 3/14: has increased pain but is able to attend now, has to use TENS unit 4/25: has not had any games to be able to attempt to go to 6/2: no games due to summer time    Time 12    Period Weeks    Status On-going    Target Date 12/09/20                   Plan - 09/23/20 1223     Clinical Impression Statement Patient able to demo improved gait distance in //bars with less physical assist with stabilization of legs. She was motivated and able to stand well and complete all activities with VC for technique, BUE Support for support and less overall physical assist. She is limited only by overall fatigue today. Reviewed previous education on energy conservation and patient verbalized understanding. Patient will continue to benefit from skilled physical therapy to increase strength, Functional mobility and activity tolerance for optimal functional independence in home and community and improved quality of life.   ?  ?    ?    Personal Factors and Comorbidities Comorbidity 3+;Transportation;Time since onset of injury/illness/exacerbation;Finances    Examination-Activity Limitations Caring for Others;Carry;Continence;Squat;Stairs;Toileting;Locomotion Level;Lift;Stand;Bed Mobility;Bend;Reach Overhead;Sit;Dressing;Transfers    Examination-Participation Restrictions Church;Cleaning;Community Activity;Driving;Interpersonal Relationship;Laundry;[employer] Work;Meal Prep;School;Volunteer    Stability/Clinical Decision Making Unstable/Unpredictable    Rehab Potential Fair    PT Frequency 2x  / week    PT Duration 12 weeks    PT Treatment/Interventions ADLs/Self Care Home Management;Aquatic Therapy;Biofeedback;Moist Heat;Electrical Stimulation;Cryotherapy;Traction;Ultrasound;Therapeutic activities;Functional mobility training;Stair training;Gait training;Therapeutic exercise;Balance training;Neuromuscular re-education;Patient/family education;Manual techniques;Dry needling;Scar mobilization;Energy conservation;Taping;Joint Manipulations;Spinal Manipulations;Visual/perceptual remediation/compensation;Passive range of motion;Wheelchair mobility training;DME Instruction;Orthotic Fit/Training    PT Next Visit Plan transfers, stabilization in standing, core activation    PT Home Exercise Plan no changes    Consulted and Agree with Plan of Care Patient             Patient will benefit from skilled therapeutic intervention in order to improve the following deficits and impairments:  Abnormal gait, Decreased balance, Decreased endurance, Decreased mobility, Difficulty walking, Increased muscle spasms, Impaired sensation, Improper body mechanics, Impaired tone, Decreased activity tolerance, Decreased coordination, Decreased strength, Postural dysfunction, Pain, Impaired flexibility  Visit Diagnosis: Abnormality of gait and mobility  Difficulty in walking, not elsewhere classified  Muscle weakness (generalized)  Other abnormalities of gait and mobility     Problem List Patient Active Problem List   Diagnosis  Date Noted   Seizure ([place]) 11/11/2018   Major depressive disorder, recurrent episode, moderate ([place]) 02/07/2018   Nephrolithiasis 04/16/2016   Numbness 07/28/2015   Bladder retention 06/23/2015   Abdominal pain 06/04/2015   Dizziness 05/18/2015   Neck pain 61/60/7371   Complicated migraine 10/11/9483   Other fatigue 04/28/2015   Abnormal finding on MRI of brain 04/28/2015   D (diarrhea) 03/29/2015   H/O disease 03/29/2015   Abnormal weight loss 03/29/2015   Muscle  weakness (generalized) 03/14/2015   Headache, migraine 03/10/2015    Lewis Moccasin, PT 09/23/2020, 1:39 PM  [place] [facility] SERVICES [address] [place], [place], [address] Phone: [phone]   Fax:  [phone]  Name: Christine Chavez MRN: [id number] Date of Birth: Apr 04, 1981

## 2020-09-27 ENCOUNTER — Ambulatory Visit: Payer: BC Managed Care – PPO

## 2020-09-30 ENCOUNTER — Ambulatory Visit: Payer: BC Managed Care – PPO

## 2020-10-04 ENCOUNTER — Other Ambulatory Visit: Payer: Self-pay

## 2020-10-04 ENCOUNTER — Ambulatory Visit: Payer: BC Managed Care – PPO

## 2020-10-04 DIAGNOSIS — M6281 Muscle weakness (generalized): Secondary | ICD-10-CM

## 2020-10-04 DIAGNOSIS — M62838 Other muscle spasm: Secondary | ICD-10-CM | POA: Diagnosis not present

## 2020-10-04 DIAGNOSIS — R262 Difficulty in walking, not elsewhere classified: Secondary | ICD-10-CM

## 2020-10-04 DIAGNOSIS — R269 Unspecified abnormalities of gait and mobility: Secondary | ICD-10-CM

## 2020-10-04 NOTE — Therapy (Signed)
[place] MAIN [facility] SERVICES [address] [place], [place], [address] Phone: [phone]   Fax:  [phone]  Physical Therapy Treatment Physical Therapy Progress Note   Dates of reporting period  08/09/20   to   10/04/20   Patient Details  Name: Christine Chavez MRN: [id number] Date of Birth: [birth year] No data recorded  Encounter Date: 10/04/2020   PT End of Session - 10/04/20 1248     Visit Number 110    Number of Visits 131    Date for PT Re-Evaluation 12/09/20    Authorization Type next session 1/10 PN 6/20    Authorization Time Period 06/28/20-09/20/20    Authorization - Visit Number 10    Authorization - Number of Visits 24    PT Start Time 1030    PT Stop Time 1115    PT Time Calculation (min) 45 min    Equipment Utilized During Treatment Gait belt    Activity Tolerance Patient tolerated treatment well;No increased pain    Behavior During Therapy WFL for tasks assessed/performed             Past Medical History:  Diagnosis Date   Complication of anesthesia    ? seizures after anesthesia    Headache    Migraines    Neurogenic bladder    Renal disorder    Vision abnormalities     Past Surgical History:  Procedure Laterality Date   ANTERIOR CRUCIATE LIGAMENT REPAIR  1997   APPENDECTOMY     COLONOSCOPY WITH PROPOFOL N/A 11/11/2018   Procedure: COLONOSCOPY WITH PROPOFOL;  Surgeon: [name], MD;  Location: [facility] ENDOSCOPY;  Service: Endoscopy;  Laterality: N/A;   CYSTOSCOPY WITH STENT PLACEMENT Right 04/17/2016   Procedure: CYSTOSCOPY WITH STENT PLACEMENT;  Surgeon: [name], MD;  Location: [facility] ORS;  Service: Urology;  Laterality: Right;   ESOPHAGOGASTRODUODENOSCOPY (EGD) WITH PROPOFOL N/A 11/11/2018   Procedure: ESOPHAGOGASTRODUODENOSCOPY (EGD) WITH PROPOFOL;  Surgeon: [name], MD;  Location: [facility] ENDOSCOPY;  Service: Endoscopy;  Laterality: N/A;   EXPLORATORY LAPAROTOMY  1999   KIDNEY STONE  SURGERY  04/2016   REVISION UROSTOMY CUTANEOUS     REVISION UROSTOMY CUTANEOUS  01/10/2018   SUPRAPUBIC CATHETER PLACEMENT  08/2017   TONSILLECTOMY      There were no vitals filed for this visit.   Subjective Assessment - 10/04/20 1245     Subjective Patient returning after vacation. No falls or LOB since last session. Has been compliant with exercises. Had multiple nights of left hip pain aggrevating patient to the point of disrupting sleep.    Limitations Lifting;Standing;Walking;House hold activities;Other (comment)    Patient Stated Goals to regain PLOF    Currently in Pain? Yes    Pain Score 5     Pain Location Back    Pain Orientation Lower    Pain Descriptors / Indicators Aching    Pain Type Chronic pain    Pain Onset More than a month ago    Pain Frequency Constant                  Forwards ambulation - down and back  in // bars  x 2 trials with BUE support on rails. VC for sequencing/heelstrike/step length. Patient with slow cadence yet short reciprocal steps and only intermittent hyperextension of  knees. She was shaky yet able to complete well with CGA today.    GTB hip flexion with stabilization to LLE to reduce knee  buckling 15 x each LE; heavy BUE support - Patient able to raise knee into resistance band well without any stance leg buckling.   4" step toe taps stabilization to stabilizing leg 10x each LE  5x STS with focus on LE strengthening; one episode of instability in L hip resulting in pain 5x modified squat with focus on gluteal squeeze, no pain; heavy UE support Speed ladder: one foot each square 4x length of // bars with stabilization provided to L knee for   lateral sidestepping 2x length of // bars, with close CGA and no LOB- short steps with heavy UE support. ; stabilization provided to L knee.   airex pad: static stand 60 seconds with intermittent UE support  Pt educated throughout session about proper posture and technique with exercises.  Improved exercise technique, movement at target joints, use of target muscles after min to mod verbal, visual, tactile cues    Patient is highly motivated throughout physical therapy session. She has diffuse LLE weakness with intermittent buckling of knees requiring stabilization. Education on continued energy conservation as well as monitoring of LLE pain for potential need for imaging. Patient will benefit from skilled physical therapy to increase strength.        Patient's condition has the potential to improve in response to therapy. Maximum improvement is yet to be obtained. The anticipated improvement is attainable and reasonable in a generally predictable time.  Patient reports she is not where she would like to be yet. Is worried about her left hip issues.    Patient's goals performed on earlier session, se refer to note from 09/16/20 for further details (three sessions prior). Patient tolerates progressive strengthenign and            PT Education - 10/04/20 1246     Education Details exercise technique, body mechanics, anatomy of L hip    Person(s) Educated Patient    Methods Explanation;Demonstration;Tactile cues;Verbal cues    Comprehension Verbalized understanding;Returned demonstration;Tactile cues required;Verbal cues required              PT Short Term Goals - 09/16/20 1400       PT SHORT TERM GOAL #1   Title Patient will demonstrate coordinated diaphragmatic breathing with pelvic tilts to demonstrate improved control of diaphragm and TA, to allow for further strengthening of core musculature and decreased pelvic floor spasm.    Baseline Pt. demonstrates breathing dysfunction and poor PFM coordination evidenced by anal manometry As of 2/22: Pt. continues to have restriction in her diaphragm and near T/L junction but is able to intentionally use diaphragmatic breathing through the available ROM.    Time 5    Period Weeks    Status Achieved    Target Date  04/20/19      PT SHORT TERM GOAL #2   Title Patient will report a reduction in pain to no greater than 6/10 over the prior week to demonstrate symptom improvement.    Baseline Pain is 10/10 at worst, 2/10 at best As of 2/22: Pt. had achieved this goal for 1 week as of 1/21 but may have infection or other insult that caused pain to increase again. As of 4/29: Pain high of 8/10 but has been over-doing her activity over the past week. As of 6/10: Pt. was able to keep pain at 6 or below since prior visit by using TENS to help keep tension lower but has not had a full week to test if it will keep working. As  of 7/6: Pt. had met this goal but has a new UTI and it caused pain to spike to 8/10 over the weekend. As of 11/9: Pt. has just finally made progress with long-standing UTI and new POC for treatment so she is still having pain spikes up to 8/10 but we expect greater improvement over the next few months. As of 12/30: still feels like she is trending in a better direction but over-did it over the holidays and pain spiked to 8/10. No higher than 6/10 over last 2 days, even with PMS cramping. 3/14: 8/10 pain from bladder spasm 4/25: 8/10 due to bladder/uterine irritation 6/2: pelvic 7/10; back 5/10    Time 5    Period Weeks    Status Partially Met    Target Date 10/21/20      PT SHORT TERM GOAL #3   Title Patient will demonstrate HEP x1 in the clinic to demonstrate understanding and proper form to allow for further improvement.    Baseline Pt. lacks knowledge of therepeutic exercises that can decrease her pain/Sx.    Time 5    Period Weeks    Status Achieved    Target Date 04/20/19      PT SHORT TERM GOAL #4   Title Patient will report consistent use of foot-stool (squatty-potty) for positioning with BM to decrease pain with BM and intra-abdominal pressure.    Baseline Pt. having constipation due to PFM dysfunction    Time 5    Period Weeks    Status Achieved    Target Date 04/20/19                PT Long Term Goals - 09/16/20 0001       PT LONG TERM GOAL #1   Title Pt. will be able to participate in regular ADL's with least restrictive device without pain increasing greater than 2/10    Baseline Pt. limited in her ability to perform household duties by increased pain and fatigue. As of 4/29: Her legs have more endurance with walking before she gets a tremor. Still needs to sit down the majority of the time she is cooking. Still needs back support for prolonged sitting. As of 6/10: Pt. able to do for  ~1 hour before she needs to rest to prevent increased pain, pain is averaging ~ 4-5/10 3/14: able to perform ADLs with less pain, does still have bad days 4/25: requires stops throughout process more fatigue than pain limiting 6/2: vas of 7/10 in pelvis occasionally    Time 12    Period Weeks    Status On-going    Target Date 12/09/20      PT LONG TERM GOAL #2   Title Patient will increase 10 meter walk test to <30 seconds as to improve gait speed for better community ambulation and to reduce fall risk.    Baseline 6/2: 1 min 18 seconds with rollator    Time 12    Period Weeks    Status New    Target Date 12/09/20      PT LONG TERM GOAL #3   Title Patient (< 24 years old) will complete five times sit to stand test in < 20 seconds indicating an increased LE strength and improved balance.    Baseline 6/2: 43 seconds with heavy BUE support    Time 12    Period Weeks    Status New    Target Date 12/09/20      PT LONG TERM GOAL #  4   Title Pt. will be able to perform 1 hr. of light housework reqiring intermittent standing and walking without increased pain/fatigue >2 pts.    Baseline Pt. becomes fatigued washing fruits/vegetbles in small stages and has fatigue>3 pts. 3/14: able to perform but needs rest breaks 4/25: able to do but is fatiguing 6/2: able to perform on good days; unable to perform on bad days    Time 12    Period Weeks    Status Partially Met    Target Date  12/09/20      PT LONG TERM GOAL #5   Title Pt. will be able to go 2-3 hours between emptying her bladder without increased pain and empty her bladder fully whether by urostomy, cath, or voluntary release.    Baseline Pt. has a urostomy but continues to have to self-catheterize. Has pain with bladder filling and when using catheter. As of 4/29: is doing better since starting to take the pregabalin andd baclofen but occasionally still has pain with bladder filling. It is bad when she has an infection or over-does activity, better on "normal" days which have been few and far between due to frequent UTI's. As of 6/10: Pt. has "good" and "bad" days but has more days where she can go 2-3 hours without increased pain. As of 04/15/20: able to wait 2 hours, not 3 yet 3/14: somedays able to meet this goals, some days is more frequent 4/25: good day: yes bad day every hour    Time 12    Period Weeks    Status Partially Met    Target Date 12/09/20      PT LONG TERM GOAL #6   Title Pt. will be able to attend a full athletic event for her children without Sx increasing by more than 2 pts.    Baseline Pt. having migraine or severe bladder spasms and fatigue and back pain> 3 pt. increase with attendance at athletic events 3/14: has increased pain but is able to attend now, has to use TENS unit 4/25: has not had any games to be able to attempt to go to 6/2: no games due to summer time    Time 12    Period Weeks    Status On-going    Target Date 12/09/20                   Plan - 10/04/20 1252     Clinical Impression Statement Patient is highly motivated throughout physical therapy session. She has diffuse LLE weakness with intermittent buckling of knees requiring stabilization. Education on continued energy conservation as well as monitoring of LLE pain for potential need for imaging. Patient's condition has the potential to improve in response to therapy. Maximum improvement is yet to be obtained. The  anticipated improvement is attainable and reasonable in a generally predictable time.  Patient will benefit from skilled physical therapy to increase strength.    Personal Factors and Comorbidities Comorbidity 3+;Transportation;Time since onset of injury/illness/exacerbation;Finances    Examination-Activity Limitations Caring for Others;Carry;Continence;Squat;Stairs;Toileting;Locomotion Level;Lift;Stand;Bed Mobility;Bend;Reach Overhead;Sit;Dressing;Transfers    Examination-Participation Restrictions Church;Cleaning;Community Activity;Driving;Interpersonal Relationship;Laundry;Pincus Badder Work;Meal Prep;School;Volunteer    Stability/Clinical Decision Making Unstable/Unpredictable    Rehab Potential Fair    PT Frequency 2x / week    PT Duration 12 weeks    PT Treatment/Interventions ADLs/Self Care Home Management;Aquatic Therapy;Biofeedback;Moist Heat;Electrical Stimulation;Cryotherapy;Traction;Ultrasound;Therapeutic activities;Functional mobility training;Stair training;Gait training;Therapeutic exercise;Balance training;Neuromuscular re-education;Patient/family education;Manual techniques;Dry needling;Scar mobilization;Energy conservation;Taping;Joint Manipulations;Spinal Manipulations;Visual/perceptual remediation/compensation;Passive range of motion;Wheelchair mobility training;DME Instruction;Orthotic Fit/Training  PT Next Visit Plan transfers, stabilization in standing, core activation    PT Home Exercise Plan no changes    Consulted and Agree with Plan of Care Patient             Patient will benefit from skilled therapeutic intervention in order to improve the following deficits and impairments:  Abnormal gait, Decreased balance, Decreased endurance, Decreased mobility, Difficulty walking, Increased muscle spasms, Impaired sensation, Improper body mechanics, Impaired tone, Decreased activity tolerance, Decreased coordination, Decreased strength, Postural dysfunction, Pain, Impaired  flexibility  Visit Diagnosis: Abnormality of gait and mobility  Difficulty in walking, not elsewhere classified  Muscle weakness (generalized)     Problem List Patient Active Problem List   Diagnosis Date Noted   Seizure ([place]) 11/11/2018   Major depressive disorder, recurrent episode, moderate ([place]) 02/07/2018   Nephrolithiasis 04/16/2016   Numbness 07/28/2015   Bladder retention 06/23/2015   Abdominal pain 06/04/2015   Dizziness 05/18/2015   Neck pain 53/97/6734   Complicated migraine 19/37/9024   Other fatigue 04/28/2015   Abnormal finding on MRI of brain 04/28/2015   D (diarrhea) 03/29/2015   H/O disease 03/29/2015   Abnormal weight loss 03/29/2015   Muscle weakness (generalized) 03/14/2015   Headache, migraine 03/10/2015   Janna Arch, PT, DPT   10/04/2020, 12:54 PM  [place] MAIN [facility] SERVICES [address] [place], [place], [address] Phone: [phone]   Fax:  [phone]  Name: Ronelle Michie MRN: [id number] Date of Birth: 1980-08-04

## 2020-10-07 ENCOUNTER — Ambulatory Visit: Payer: BC Managed Care – PPO

## 2020-10-07 ENCOUNTER — Other Ambulatory Visit: Payer: Self-pay

## 2020-10-07 DIAGNOSIS — M62838 Other muscle spasm: Secondary | ICD-10-CM | POA: Diagnosis not present

## 2020-10-07 DIAGNOSIS — R269 Unspecified abnormalities of gait and mobility: Secondary | ICD-10-CM

## 2020-10-07 DIAGNOSIS — R262 Difficulty in walking, not elsewhere classified: Secondary | ICD-10-CM

## 2020-10-07 DIAGNOSIS — M6281 Muscle weakness (generalized): Secondary | ICD-10-CM

## 2020-10-07 NOTE — Therapy (Signed)
[place] MAIN [facility] SERVICES [address] [place], [place], [address] Phone: [phone]   Fax:  [phone]  Physical Therapy Treatment  Patient Details  Name: Christine Chavez MRN: [id number] Date of Birth: 07/09/1980 No data recorded  Encounter Date: 10/07/2020   PT End of Session - 10/07/20 1225     Visit Number 111    Number of Visits 131    Date for PT Re-Evaluation 12/09/20    Authorization Type 1/10 PN 6/20    Authorization Time Period 06/28/20-09/20/20    Authorization - Visit Number 11    Authorization - Number of Visits 24    PT Start Time 1100    PT Stop Time 1146    PT Time Calculation (min) 46 min    Equipment Utilized During Treatment Gait belt    Activity Tolerance Patient tolerated treatment well;No increased pain    Behavior During Therapy WFL for tasks assessed/performed             Past Medical History:  Diagnosis Date   Complication of anesthesia    ? seizures after anesthesia    Headache    Migraines    Neurogenic bladder    Renal disorder    Vision abnormalities     Past Surgical History:  Procedure Laterality Date   ANTERIOR CRUCIATE LIGAMENT REPAIR  1997   APPENDECTOMY     COLONOSCOPY WITH PROPOFOL N/A 11/11/2018   Procedure: COLONOSCOPY WITH PROPOFOL;  Surgeon: [name], MD;  Location: [facility] ENDOSCOPY;  Service: Endoscopy;  Laterality: N/A;   CYSTOSCOPY WITH STENT PLACEMENT Right 04/17/2016   Procedure: CYSTOSCOPY WITH STENT PLACEMENT;  Surgeon: [name], MD;  Location: [facility] ORS;  Service: Urology;  Laterality: Right;   ESOPHAGOGASTRODUODENOSCOPY (EGD) WITH PROPOFOL N/A 11/11/2018   Procedure: ESOPHAGOGASTRODUODENOSCOPY (EGD) WITH PROPOFOL;  Surgeon: [name], MD;  Location: [facility] ENDOSCOPY;  Service: Endoscopy;  Laterality: N/A;   EXPLORATORY LAPAROTOMY  1999   KIDNEY STONE SURGERY  04/2016   REVISION UROSTOMY CUTANEOUS     REVISION UROSTOMY CUTANEOUS  01/10/2018    SUPRAPUBIC CATHETER PLACEMENT  08/2017   TONSILLECTOMY      There were no vitals filed for this visit.   Subjective Assessment - 10/07/20 1224     Subjective Patient reports she overdid it yesterday and is having back pain/tightness today. Has been having uterine spasms.    Limitations Lifting;Standing;Walking;House hold activities;Other (comment)    Patient Stated Goals to regain PLOF    Currently in Pain? Yes    Pain Score 4     Pain Location Back    Pain Orientation Lower    Pain Descriptors / Indicators Tightness    Pain Type Chronic pain    Pain Onset More than a month ago    Pain Frequency Constant                    Treatment: Supine with heat pad on back: adduction ball squeeze 15x 3 seconds hold  LE rotation 10x each LE low back pain relieving ; x 2 sets  Hamstring stretch on PT shoulder with increasing range of motion with time 60 seconds each LE Sciatic nerve glide with leg on PT shoulder 30 seconds each LE Piriformis stretch 60 seconds each LE Cross body internal rotation hip stretch with PT overpressure 60 seconds each LE GTB around bilateral knees: abduction 15x GTB around bilateral knees; flexion with posterior pelvic tilt 12x each LE Heel  slide 12x each LE with Rosezella Rumpf swiss ball TrA activation 10x 3 second holds  Green swiss ball hamstring curl 15x   Seated: LAQ 12x each LE       Pt educated throughout session about proper posture and technique with exercises. Improved exercise technique, movement at target joints, use of target muscles after min to mod verbal, visual, tactile cues    Patient initially presents with increased stiffness and pain that slowly decreased throughout session. She has high motivation throughout session and was able to tolerate supine and seated interventions well. Prolonged LE lengthening exercises improved muscle tissue length allowing for progressive range with prolonged hold. Patient will benefit from skilled  physical therapy to increase strength.           PT Education - 10/07/20 1224     Education Details exercise technique, body mechanics    Person(s) Educated Patient    Methods Explanation;Demonstration;Tactile cues;Verbal cues    Comprehension Verbalized understanding;Verbal cues required;Returned demonstration;Tactile cues required              PT Short Term Goals - 09/16/20 1400       PT SHORT TERM GOAL #1   Title Patient will demonstrate coordinated diaphragmatic breathing with pelvic tilts to demonstrate improved control of diaphragm and TA, to allow for further strengthening of core musculature and decreased pelvic floor spasm.    Baseline Pt. demonstrates breathing dysfunction and poor PFM coordination evidenced by anal manometry As of 2/22: Pt. continues to have restriction in her diaphragm and near T/L junction but is able to intentionally use diaphragmatic breathing through the available ROM.    Time 5    Period Weeks    Status Achieved    Target Date 04/20/19      PT SHORT TERM GOAL #2   Title Patient will report a reduction in pain to no greater than 6/10 over the prior week to demonstrate symptom improvement.    Baseline Pain is 10/10 at worst, 2/10 at best As of 2/22: Pt. had achieved this goal for 1 week as of 1/21 but may have infection or other insult that caused pain to increase again. As of 4/29: Pain high of 8/10 but has been over-doing her activity over the past week. As of 6/10: Pt. was able to keep pain at 6 or below since prior visit by using TENS to help keep tension lower but has not had a full week to test if it will keep working. As of 7/6: Pt. had met this goal but has a new UTI and it caused pain to spike to 8/10 over the weekend. As of 11/9: Pt. has just finally made progress with long-standing UTI and new POC for treatment so she is still having pain spikes up to 8/10 but we expect greater improvement over the next few months. As of 12/30: still  feels like she is trending in a better direction but over-did it over the holidays and pain spiked to 8/10. No higher than 6/10 over last 2 days, even with PMS cramping. 3/14: 8/10 pain from bladder spasm 4/25: 8/10 due to bladder/uterine irritation 6/2: pelvic 7/10; back 5/10    Time 5    Period Weeks    Status Partially Met    Target Date 10/21/20      PT SHORT TERM GOAL #3   Title Patient will demonstrate HEP x1 in the clinic to demonstrate understanding and proper form to allow for further improvement.  Baseline Pt. lacks knowledge of therepeutic exercises that can decrease her pain/Sx.    Time 5    Period Weeks    Status Achieved    Target Date 04/20/19      PT SHORT TERM GOAL #4   Title Patient will report consistent use of foot-stool (squatty-potty) for positioning with BM to decrease pain with BM and intra-abdominal pressure.    Baseline Pt. having constipation due to PFM dysfunction    Time 5    Period Weeks    Status Achieved    Target Date 04/20/19               PT Long Term Goals - 09/16/20 0001       PT LONG TERM GOAL #1   Title Pt. will be able to participate in regular ADL's with least restrictive device without pain increasing greater than 2/10    Baseline Pt. limited in her ability to perform household duties by increased pain and fatigue. As of 4/29: Her legs have more endurance with walking before she gets a tremor. Still needs to sit down the majority of the time she is cooking. Still needs back support for prolonged sitting. As of 6/10: Pt. able to do for  ~1 hour before she needs to rest to prevent increased pain, pain is averaging ~ 4-5/10 3/14: able to perform ADLs with less pain, does still have bad days 4/25: requires stops throughout process more fatigue than pain limiting 6/2: vas of 7/10 in pelvis occasionally    Time 12    Period Weeks    Status On-going    Target Date 12/09/20      PT LONG TERM GOAL #2   Title Patient will increase 10 meter  walk test to <30 seconds as to improve gait speed for better community ambulation and to reduce fall risk.    Baseline 6/2: 1 min 18 seconds with rollator    Time 12    Period Weeks    Status New    Target Date 12/09/20      PT LONG TERM GOAL #3   Title Patient (< 50 years old) will complete five times sit to stand test in < 20 seconds indicating an increased LE strength and improved balance.    Baseline 6/2: 43 seconds with heavy BUE support    Time 12    Period Weeks    Status New    Target Date 12/09/20      PT LONG TERM GOAL #4   Title Pt. will be able to perform 1 hr. of light housework reqiring intermittent standing and walking without increased pain/fatigue >2 pts.    Baseline Pt. becomes fatigued washing fruits/vegetbles in small stages and has fatigue>3 pts. 3/14: able to perform but needs rest breaks 4/25: able to do but is fatiguing 6/2: able to perform on good days; unable to perform on bad days    Time 12    Period Weeks    Status Partially Met    Target Date 12/09/20      PT LONG TERM GOAL #5   Title Pt. will be able to go 2-3 hours between emptying her bladder without increased pain and empty her bladder fully whether by urostomy, cath, or voluntary release.    Baseline Pt. has a urostomy but continues to have to self-catheterize. Has pain with bladder filling and when using catheter. As of 4/29: is doing better since starting to take the pregabalin andd baclofen but occasionally  still has pain with bladder filling. It is bad when she has an infection or over-does activity, better on "normal" days which have been few and far between due to frequent UTI's. As of 6/10: Pt. has "good" and "bad" days but has more days where she can go 2-3 hours without increased pain. As of 04/15/20: able to wait 2 hours, not 3 yet 3/14: somedays able to meet this goals, some days is more frequent 4/25: good day: yes bad day every hour    Time 12    Period Weeks    Status Partially Met     Target Date 12/09/20      PT LONG TERM GOAL #6   Title Pt. will be able to attend a full athletic event for her children without Sx increasing by more than 2 pts.    Baseline Pt. having migraine or severe bladder spasms and fatigue and back pain> 3 pt. increase with attendance at athletic events 3/14: has increased pain but is able to attend now, has to use TENS unit 4/25: has not had any games to be able to attempt to go to 6/2: no games due to summer time    Time 12    Period Weeks    Status On-going    Target Date 12/09/20                   Plan - 10/07/20 1236     Clinical Impression Statement Patient initially presents with increased stiffness and pain that slowly decreased throughout session. She has high motivation throughout session and was able to tolerate supine and seated interventions well. Prolonged LE lengthening exercises improved muscle tissue length allowing for progressive range with prolonged hold. Patient will benefit from skilled physical therapy to increase strength.    Personal Factors and Comorbidities Comorbidity 3+;Transportation;Time since onset of injury/illness/exacerbation;Finances    Examination-Activity Limitations Caring for Others;Carry;Continence;Squat;Stairs;Toileting;Locomotion Level;Lift;Stand;Bed Mobility;Bend;Reach Overhead;Sit;Dressing;Transfers    Examination-Participation Restrictions Church;Cleaning;Community Activity;Driving;Interpersonal Relationship;Laundry;Pincus Badder Work;Meal Prep;School;Volunteer    Stability/Clinical Decision Making Unstable/Unpredictable    Rehab Potential Fair    PT Frequency 2x / week    PT Duration 12 weeks    PT Treatment/Interventions ADLs/Self Care Home Management;Aquatic Therapy;Biofeedback;Moist Heat;Electrical Stimulation;Cryotherapy;Traction;Ultrasound;Therapeutic activities;Functional mobility training;Stair training;Gait training;Therapeutic exercise;Balance training;Neuromuscular re-education;Patient/family  education;Manual techniques;Dry needling;Scar mobilization;Energy conservation;Taping;Joint Manipulations;Spinal Manipulations;Visual/perceptual remediation/compensation;Passive range of motion;Wheelchair mobility training;DME Instruction;Orthotic Fit/Training    PT Next Visit Plan transfers, stabilization in standing, core activation    PT Home Exercise Plan no changes    Consulted and Agree with Plan of Care Patient             Patient will benefit from skilled therapeutic intervention in order to improve the following deficits and impairments:  Abnormal gait, Decreased balance, Decreased endurance, Decreased mobility, Difficulty walking, Increased muscle spasms, Impaired sensation, Improper body mechanics, Impaired tone, Decreased activity tolerance, Decreased coordination, Decreased strength, Postural dysfunction, Pain, Impaired flexibility  Visit Diagnosis: Abnormality of gait and mobility  Difficulty in walking, not elsewhere classified  Muscle weakness (generalized)     Problem List Patient Active Problem List   Diagnosis Date Noted   Seizure (HCC) 11/11/2018   Major depressive disorder, recurrent episode, moderate (HCC) 02/07/2018   Nephrolithiasis 04/16/2016   Numbness 07/28/2015   Bladder retention 06/23/2015   Abdominal pain 06/04/2015   Dizziness 05/18/2015   Neck pain 05/18/2015   Complicated migraine 04/28/2015   Other fatigue 04/28/2015   Abnormal finding on MRI of brain 04/28/2015   D (diarrhea) 03/29/2015  H/O disease 03/29/2015   Abnormal weight loss 03/29/2015   Muscle weakness (generalized) 03/14/2015   Headache, migraine 03/10/2015    Janna Arch, PT, DPT   10/07/2020, 12:37 PM  [place] MAIN [facility] SERVICES [address] [place], [place], [address] Phone: [phone]   Fax:  [phone]  Name: Christine Chavez MRN: [id number] Date of Birth: 10/09/1980

## 2020-10-11 ENCOUNTER — Ambulatory Visit: Payer: BC Managed Care – PPO

## 2020-10-11 ENCOUNTER — Other Ambulatory Visit: Payer: Self-pay

## 2020-10-11 DIAGNOSIS — R269 Unspecified abnormalities of gait and mobility: Secondary | ICD-10-CM

## 2020-10-11 DIAGNOSIS — M62838 Other muscle spasm: Secondary | ICD-10-CM | POA: Diagnosis not present

## 2020-10-11 DIAGNOSIS — R262 Difficulty in walking, not elsewhere classified: Secondary | ICD-10-CM

## 2020-10-11 DIAGNOSIS — M6281 Muscle weakness (generalized): Secondary | ICD-10-CM

## 2020-10-11 NOTE — Therapy (Signed)
[place] [facility] [address] [place], [place], [address] Phone: [phone]   Fax:  [phone]  Physical Therapy Treatment  Patient Details  Name: Christine Chavez MRN: [id number] Date of Birth: 1981/02/15 No data recorded  Encounter Date: 10/11/2020   PT End of Session - 10/11/20 1240     Visit Number 112    Number of Visits 131    Date for PT Re-Evaluation 12/09/20    Authorization Type 2/10 PN 6/20    Authorization Time Period 06/28/20-09/20/20    Authorization - Visit Number 12    Authorization - Number of Visits 24    PT Start Time 0300    PT Stop Time 1115    PT Time Calculation (min) 35 min    Equipment Utilized During Treatment Gait belt    Activity Tolerance Patient tolerated treatment well;No increased pain    Behavior During Therapy WFL for tasks assessed/performed             Past Medical History:  Diagnosis Date   Complication of anesthesia    ? seizures after anesthesia    Headache    Migraines    Neurogenic bladder    Renal disorder    Vision abnormalities     Past Surgical History:  Procedure Laterality Date   ANTERIOR CRUCIATE LIGAMENT REPAIR  1997   APPENDECTOMY     COLONOSCOPY WITH PROPOFOL N/A 11/11/2018   Procedure: COLONOSCOPY WITH PROPOFOL;  Surgeon: [name], MD;  Location: [facility] ENDOSCOPY;  Service: Endoscopy;  Laterality: N/A;   CYSTOSCOPY WITH STENT PLACEMENT Right 04/17/2016   Procedure: CYSTOSCOPY WITH STENT PLACEMENT;  Surgeon: [name], MD;  Location: [facility] ORS;  Service: Urology;  Laterality: Right;   ESOPHAGOGASTRODUODENOSCOPY (EGD) WITH PROPOFOL N/A 11/11/2018   Procedure: ESOPHAGOGASTRODUODENOSCOPY (EGD) WITH PROPOFOL;  Surgeon: [name], MD;  Location: [facility] ENDOSCOPY;  Service: Endoscopy;  Laterality: N/A;   EXPLORATORY LAPAROTOMY  1999   KIDNEY STONE SURGERY  04/2016   REVISION UROSTOMY CUTANEOUS     REVISION UROSTOMY CUTANEOUS  01/10/2018    SUPRAPUBIC CATHETER PLACEMENT  08/2017   TONSILLECTOMY      There were no vitals filed for this visit.   Subjective Assessment - 10/11/20 1238     Subjective Patient reports increased spasms in her bladder today. No falls or LOB since last session, was able to have a girls night over the weekend.    Limitations Lifting;Standing;Walking;House hold activities;Other (comment)    Patient Stated Goals to regain PLOF    Currently in Pain? Yes    Pain Score 5     Pain Orientation Lower    Pain Descriptors / Indicators Tightness    Pain Type Chronic pain    Pain Onset More than a month ago    Pain Frequency Constant                   Treatment: Supine with heat pad on back: LE rotation 10x each LE low back pain relieving ; x 2 sets  Hamstring stretch on PT shoulder with increasing range of motion with time 60 seconds each LE Sciatic nerve glide with leg on PT shoulder 30 seconds each LE Piriformis stretch 60 seconds each LE Cross body internal rotation hip stretch with PT overpressure 60 seconds each LE BTB around bilateral knees: abduction 15x BTB around bilateral knees; flexion with posterior pelvic tilt 12x each LE Heel slide 12x each LE with Rosezella Rumpf  swiss ball TrA activation 10x 3 second holds Green swiss ball hamstring curl 15x Green swiss ball overhead raises 10x    Seated: LAQ 12x each LE          Pt educated throughout session about proper posture and technique with exercises. Improved exercise technique, movement at target joints, use of target muscles after min to mod verbal, visual, tactile cues   Patient is aware next session will be a wheelchair evaluation for a manual wheelchair. Patient is able to tolerate progressive strengthening interventions without pain increase. She is highly motivated throughout physical therapy session. Patient will continue to benefit from skilled physical therapy to increase strength, functional mobility and activity tolerance  for optimal functional independence in home and community and improved quality of life                   PT Education - 10/11/20 1240     Education Details exercise technique, body mechanics    Person(s) Educated Patient    Methods Explanation;Demonstration;Tactile cues;Verbal cues    Comprehension Verbalized understanding;Returned demonstration;Verbal cues required;Tactile cues required              PT Short Term Goals - 09/16/20 1400       PT SHORT TERM GOAL #1   Title Patient will demonstrate coordinated diaphragmatic breathing with pelvic tilts to demonstrate improved control of diaphragm and TA, to allow for further strengthening of core musculature and decreased pelvic floor spasm.    Baseline Pt. demonstrates breathing dysfunction and poor PFM coordination evidenced by anal manometry As of 2/22: Pt. continues to have restriction in her diaphragm and near T/L junction but is able to intentionally use diaphragmatic breathing through the available ROM.    Time 5    Period Weeks    Status Achieved    Target Date 04/20/19      PT SHORT TERM GOAL #2   Title Patient will report a reduction in pain to no greater than 6/10 over the prior week to demonstrate symptom improvement.    Baseline Pain is 10/10 at worst, 2/10 at best As of 2/22: Pt. had achieved this goal for 1 week as of 1/21 but may have infection or other insult that caused pain to increase again. As of 4/29: Pain high of 8/10 but has been over-doing her activity over the past week. As of 6/10: Pt. was able to keep pain at 6 or below since prior visit by using TENS to help keep tension lower but has not had a full week to test if it will keep working. As of 7/6: Pt. had met this goal but has a new UTI and it caused pain to spike to 8/10 over the weekend. As of 11/9: Pt. has just finally made progress with long-standing UTI and new POC for treatment so she is still having pain spikes up to 8/10 but we expect  greater improvement over the next few months. As of 12/30: still feels like she is trending in a better direction but over-did it over the holidays and pain spiked to 8/10. No higher than 6/10 over last 2 days, even with PMS cramping. 3/14: 8/10 pain from bladder spasm 4/25: 8/10 due to bladder/uterine irritation 6/2: pelvic 7/10; back 5/10    Time 5    Period Weeks    Status Partially Met    Target Date 10/21/20      PT SHORT TERM GOAL #3   Title Patient will demonstrate HEP x1  in the clinic to demonstrate understanding and proper form to allow for further improvement.    Baseline Pt. lacks knowledge of therepeutic exercises that can decrease her pain/Sx.    Time 5    Period Weeks    Status Achieved    Target Date 04/20/19      PT SHORT TERM GOAL #4   Title Patient will report consistent use of foot-stool (squatty-potty) for positioning with BM to decrease pain with BM and intra-abdominal pressure.    Baseline Pt. having constipation due to PFM dysfunction    Time 5    Period Weeks    Status Achieved    Target Date 04/20/19               PT Long Term Goals - 09/16/20 0001       PT LONG TERM GOAL #1   Title Pt. will be able to participate in regular ADL's with least restrictive device without pain increasing greater than 2/10    Baseline Pt. limited in her ability to perform household duties by increased pain and fatigue. As of 4/29: Her legs have more endurance with walking before she gets a tremor. Still needs to sit down the majority of the time she is cooking. Still needs back support for prolonged sitting. As of 6/10: Pt. able to do for  ~1 hour before she needs to rest to prevent increased pain, pain is averaging ~ 4-5/10 3/14: able to perform ADLs with less pain, does still have bad days 4/25: requires stops throughout process more fatigue than pain limiting 6/2: vas of 7/10 in pelvis occasionally    Time 12    Period Weeks    Status On-going    Target Date 12/09/20       PT LONG TERM GOAL #2   Title Patient will increase 10 meter walk test to <30 seconds as to improve gait speed for better community ambulation and to reduce fall risk.    Baseline 6/2: 1 min 18 seconds with rollator    Time 12    Period Weeks    Status New    Target Date 12/09/20      PT LONG TERM GOAL #3   Title Patient (< 3 years old) will complete five times sit to stand test in < 20 seconds indicating an increased LE strength and improved balance.    Baseline 6/2: 43 seconds with heavy BUE support    Time 12    Period Weeks    Status New    Target Date 12/09/20      PT LONG TERM GOAL #4   Title Pt. will be able to perform 1 hr. of light housework reqiring intermittent standing and walking without increased pain/fatigue >2 pts.    Baseline Pt. becomes fatigued washing fruits/vegetbles in small stages and has fatigue>3 pts. 3/14: able to perform but needs rest breaks 4/25: able to do but is fatiguing 6/2: able to perform on good days; unable to perform on bad days    Time 12    Period Weeks    Status Partially Met    Target Date 12/09/20      PT LONG TERM GOAL #5   Title Pt. will be able to go 2-3 hours between emptying her bladder without increased pain and empty her bladder fully whether by urostomy, cath, or voluntary release.    Baseline Pt. has a urostomy but continues to have to self-catheterize. Has pain with bladder filling and when using  catheter. As of 4/29: is doing better since starting to take the pregabalin andd baclofen but occasionally still has pain with bladder filling. It is bad when she has an infection or over-does activity, better on "normal" days which have been few and far between due to frequent UTI's. As of 6/10: Pt. has "good" and "bad" days but has more days where she can go 2-3 hours without increased pain. As of 04/15/20: able to wait 2 hours, not 3 yet 3/14: somedays able to meet this goals, some days is more frequent 4/25: good day: yes bad day every  hour    Time 12    Period Weeks    Status Partially Met    Target Date 12/09/20      PT LONG TERM GOAL #6   Title Pt. will be able to attend a full athletic event for her children without Sx increasing by more than 2 pts.    Baseline Pt. having migraine or severe bladder spasms and fatigue and back pain> 3 pt. increase with attendance at athletic events 3/14: has increased pain but is able to attend now, has to use TENS unit 4/25: has not had any games to be able to attempt to go to 6/2: no games due to summer time    Time 12    Period Weeks    Status On-going    Target Date 12/09/20                   Plan - 10/11/20 1244     Clinical Impression Statement Patient is aware next session will be a wheelchair evaluation for a manual wheelchair. Patient is able to tolerate progressive strengthening interventions without pain increase. She is highly motivated throughout physical therapy session. Patient will continue to benefit from skilled physical therapy to increase strength, functional mobility and activity tolerance for optimal functional independence in home and community and improved quality of life    Personal Factors and Comorbidities Comorbidity 3+;Transportation;Time since onset of injury/illness/exacerbation;Finances    Examination-Activity Limitations Caring for Others;Carry;Continence;Squat;Stairs;Toileting;Locomotion Level;Lift;Stand;Bed Mobility;Bend;Reach Overhead;Sit;Dressing;Transfers    Examination-Participation Restrictions Church;Cleaning;Community Activity;Driving;Interpersonal Relationship;Laundry;[employer] Work;Meal Prep;School;Volunteer    Stability/Clinical Decision Making Unstable/Unpredictable    Rehab Potential Fair    PT Frequency 2x / week    PT Duration 12 weeks    PT Treatment/Interventions ADLs/Self Care Home Management;Aquatic Therapy;Biofeedback;Moist Heat;Electrical Stimulation;Cryotherapy;Traction;Ultrasound;Therapeutic activities;Functional mobility  training;Stair training;Gait training;Therapeutic exercise;Balance training;Neuromuscular re-education;Patient/family education;Manual techniques;Dry needling;Scar mobilization;Energy conservation;Taping;Joint Manipulations;Spinal Manipulations;Visual/perceptual remediation/compensation;Passive range of motion;Wheelchair mobility training;DME Instruction;Orthotic Fit/Training    PT Next Visit Plan transfers, stabilization in standing, core activation    PT Home Exercise Plan no changes    Consulted and Agree with Plan of Care Patient             Patient will benefit from skilled therapeutic intervention in order to improve the following deficits and impairments:  Abnormal gait, Decreased balance, Decreased endurance, Decreased mobility, Difficulty walking, Increased muscle spasms, Impaired sensation, Improper body mechanics, Impaired tone, Decreased activity tolerance, Decreased coordination, Decreased strength, Postural dysfunction, Pain, Impaired flexibility  Visit Diagnosis: Abnormality of gait and mobility  Muscle weakness (generalized)  Difficulty in walking, not elsewhere classified     Problem List Patient Active Problem List   Diagnosis Date Noted   Seizure ([place]) 11/11/2018   Major depressive disorder, recurrent episode, moderate ([place]) 02/07/2018   Nephrolithiasis 04/16/2016   Numbness 07/28/2015   Bladder retention 06/23/2015   Abdominal pain 06/04/2015   Dizziness 05/18/2015   Neck pain 05/18/2015  Complicated migraine 59/97/7414   Other fatigue 04/28/2015   Abnormal finding on MRI of brain 04/28/2015   D (diarrhea) 03/29/2015   H/O disease 03/29/2015   Abnormal weight loss 03/29/2015   Muscle weakness (generalized) 03/14/2015   Headache, migraine 03/10/2015    Janna Arch, PT, DPT  10/11/2020, 12:45 PM  [place] [facility] [address] [place], [place], [address] Phone: [phone]   Fax:   [phone]  Name: Christine Chavez MRN: [id number] Date of Birth: 04/12/1981

## 2020-10-14 ENCOUNTER — Ambulatory Visit: Payer: BC Managed Care – PPO

## 2020-10-14 ENCOUNTER — Other Ambulatory Visit: Payer: Self-pay

## 2020-10-14 DIAGNOSIS — R2689 Other abnormalities of gait and mobility: Secondary | ICD-10-CM

## 2020-10-14 DIAGNOSIS — M6281 Muscle weakness (generalized): Secondary | ICD-10-CM

## 2020-10-14 DIAGNOSIS — M62838 Other muscle spasm: Secondary | ICD-10-CM | POA: Diagnosis not present

## 2020-10-14 NOTE — Addendum Note (Signed)
Addended by: Flora Lipps T on: 10/14/2020 07:59 AM   Modules accepted: Orders

## 2020-10-14 NOTE — Therapy (Signed)
[place] [facility] SERVICES [address] [place], [place], [address] Phone: [phone]   Fax:  [phone]  Physical Therapy Wheelchair Evaluation  Patient Details  Name: Nia Nathaniel MRN: [id number] Date of Birth: 18-Feb-1981 Referring Provider (PT): Elza Rafter  Encounter Date: 10/14/2020   PT End of Session - 10/14/20 1735     Visit Number 1    Number of Visits 1    Date for PT Re-Evaluation 10/14/20    Authorization Type BCBS    PT Start Time 1100    PT Stop Time 1155    PT Time Calculation (min) 55 min    Equipment Utilized During Treatment Gait belt    Activity Tolerance Patient tolerated treatment well             Past Medical History:  Diagnosis Date   Complication of anesthesia    ? seizures after anesthesia    Headache    Migraines    Neurogenic bladder    Renal disorder    Vision abnormalities     Past Surgical History:  Procedure Laterality Date   ANTERIOR CRUCIATE LIGAMENT REPAIR  1997   APPENDECTOMY     COLONOSCOPY WITH PROPOFOL N/A 11/11/2018   Procedure: COLONOSCOPY WITH PROPOFOL;  Surgeon: [name], MD;  Location: [facility] ENDOSCOPY;  Service: Endoscopy;  Laterality: N/A;   CYSTOSCOPY WITH STENT PLACEMENT Right 04/17/2016   Procedure: CYSTOSCOPY WITH STENT PLACEMENT;  Surgeon: [name], MD;  Location: [facility] ORS;  Service: Urology;  Laterality: Right;   ESOPHAGOGASTRODUODENOSCOPY (EGD) WITH PROPOFOL N/A 11/11/2018   Procedure: ESOPHAGOGASTRODUODENOSCOPY (EGD) WITH PROPOFOL;  Surgeon: [name], MD;  Location: [facility] ENDOSCOPY;  Service: Endoscopy;  Laterality: N/A;   EXPLORATORY LAPAROTOMY  1999   KIDNEY STONE SURGERY  04/2016   REVISION UROSTOMY CUTANEOUS     REVISION UROSTOMY CUTANEOUS  01/10/2018   SUPRAPUBIC CATHETER PLACEMENT  08/2017   TONSILLECTOMY      There were no vitals filed for this visit.    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 Due to the rarity of her disorder it took years prior to diagnosis. 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  Patient has good days and bad days. Her good days allow for short ambulation (within room only not between rooms) and her bad days she is unable to ambulate.    Limitations Lifting;Standing;Walking;House hold activities;Other (comment)    How long can you sit comfortably? n/a    How long can you stand comfortably? limited on bad days    How long can you walk comfortably? within room only on good days, unable on bad days    Patient Stated Goals to obtain manual wheelchair    Currently in Pain? Yes    Pain Score 6     Pain Location Back    Pain Orientation Lower    Pain Descriptors / Indicators Aching;Throbbing    Pain Type Chronic pain    Pain Onset More than a month ago    Pain Frequency Constant  [facility] PT Assessment - 10/14/20 0001       Assessment   Medical Diagnosis Hereditary Spastic Paraplegia    Referring Provider (PT) Nada Boozer Gladys Damme    Onset Date/Surgical Date 02/16/15    Hand Dominance Right    Prior Therapy yes      Precautions   Precautions None      Restrictions   Weight Bearing Restrictions No      Home Environment   Living Environment Private residence    Living Arrangements Spouse/significant other;Children    Available Help at Discharge Family    Type of [place] to enter    Entrance Stairs-Number of Steps [place] One level    [place] - 4 wheels                    PATIENT INFORMATION: This Evaluation form will serve as the LMN for the following suppliers:  Supplier: [employer] Contact Person: Deberah Pelton Phone:[phone]   Reason for Referral: Patient/caregiver Goals: Patient was seen for face-to-face evaluation for new power wheelchair.  Also present was    Deberah Pelton from [employer]     to discuss recommendations and wheelchair options.  Further paperwork was completed and sent to vendor.  Patient appears to qualify for manual device at this time per objective findings.   MEDICAL HISTORY: Diagnosis: Hereditary Spastic Paraplegia Primary Diagnosis Onset:Hereditary Spastic Paraplegia  diagnosed on may 9th, 2022 but symptoms began over 5 years ago.  _0 Progressive Disease Relevant Past and Future Surgeries: urostomy (2019), suprapubic catheter placement (2019), cytoscopy with stent (2018).  Height: 5 ft 7 Weight:143 Explain and recent changes or trends in weight: gained weight from medicine.   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 Due to the rarity of her disorder it took years prior to diagnosis. 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  Patient has good days and bad days. Her good days allow for short ambulation (within room only not between rooms) and her bad days she is unable to ambulate.      HOME ENVIRONMENT: _1 House  _2 Condo/town home  _3 Apartment  _4 Assisted Living    _5 Lives Alone _6  Lives with Others                                                    Hours with caregiver:   _7 Home is accessible to patient            Stairs  _8 Yes _9  No     Ramp _10 Yes _11 No Comments:   Four steps to get in  but has left it where she can have a ramp.  Spends time home alone when kids are at school (about 8-10 hours)   COMMUNITY ADL: TRANSPORTATION: _12 Car    _13 Van    <OIZTIWPYKDXIPJAS>_5<\/KNLZJQBHALPFXTKW>_40 Public Transportation    _15 Adapted w/c Lift   _16 Ambulance   _17 Other:       _18 Sits in wheelchair during transport  Employment/School:     Specific requirements pertaining to mobility  Other:     She is unable to drive at this time, is waiting to make the decision on getting hand controls. Is given rides by community members/church members/friends to appointments.                                FUNCTIONAL/SENSORY PROCESSING SKILLS:  Handedness:   _0 Right     _1 Left    _2 NA  Comments:                                 Functional Processing Skills for Wheeled Mobility _3 Processing Skills are adequate for safe wheelchair operation  Areas of concern than may interfere with safe operation of wheelchair Description of problem   _4  Attention to environment     _5 Judgment     _6  Hearing  _7  Vision or visual processing    _8 Motor Planning  _9  Fluctuations in Behavior                                                   VERBAL COMMUNICATION: _10 WFL receptive _11  WFL expressive _12 Understandable  _13 Difficult to understand  _14 non-communicative _15  Uses an augmented communication device    CURRENT SEATING / MOBILITY: Current Mobility Base:   _16 None  _17 Dependent-has a transport chair  _18 Manual  _19 Scooter  _20 Power   Type of Control:                       Manufacturer:     n/a                    Size:    n/a                     Age:     n/a: was given to her by a member of her church.                       Current Condition of Mobility : very old and falling apart Base:      wheels are sticking easily; challenging to move.                                                                                                                Current Wheelchair components:  standard  transport chair  Describe posture in present seating system:  rounded shoulders as seat is too small for her, feet not positioned correctly due to being too small for her.                                                                           SENSATION and SKIN ISSUES: Sensation _0 Intact _1 Impaired _2 Absent   Level of sensation:  tinging and burning in LE's.  Able to know when she needs to weight shift                         Pressure Relief: Able to perform effective pressure relief :   _3 Yes  _4  No Method: tricep press, partial stand, and weight shift.                                                                              If not, Why?:                                                                          Skin Issues/Skin Integrity Current Skin Issues   _5 Yes _6 No  _7 Intact _8  Red area _9  Open Area  _10 Scar Tissue _11 At risk from prolonged sitting  Where                              History of Skin Issues   _12 Yes _13 No  Where                                         When                                               Hx of skin flap surgeries _14 Yes _15 No  Where                  When                                                  Limited sitting tolerance _16 Yes _17 No Hours spent sitting in wheelchair daily:  Complaint of Pain:  Please describe:                                                                                                           Pelvis: 8/10 Back: 7-8/10 Legs: 6/10 Dislocating hip 10/10  Swelling/Edema:  slight LE swelling with prolonged day.                                                                                                                                                 ADL STATUS (in reference to wheelchair use):  Indep Assist Unable Indep  with Equip Not assessed Comments  Dressing                                 x                          Dresses in sitting position              Eating     x                                                                                                                         Toileting                                       x                        On a good day ind. Has urostomy  Bathing               x                                               Needs assistance for mobility, especially on bad day due to inability to transfer on bad day.                                                                         Grooming/ Hygiene                                  x                       Performs in sitting                                                                   Meal Prep                 x                    x                       Has to sit when performing but frequently needs rest breaks, on bad days unable to perform                                                               IADLS                x                                          Unable to perform without assistance on bad days.                                                        Bowel Management: _0 Continent  _1 Incontinent  _2 Accidents Comments:    Spasticity and bowel involvement: seeing urogynocologist  Bladder Management: _0 Continent  _1 Incontinent  _2 Accidents Comments:   Has the urostomy  manages ind needs help on bad days                                              WHEELCHAIR SKILLS: Manual w/c Propulsion: _3 UE or LE strength and endurance sufficient to participate in ADLs using manual wheelchair Arm :  _4 left _5 right  _6 Both                                   Foot:   _7 left _8 right  _9 Both  Distance: no limitation for Ue's   Operate Scooter: _10  Strength, hand grip, balance and transfer appropriate  for use _11 Living environment is accessible for use of scooter  Operate Power w/c:  _12  Std. Joystick   _13  Alternative Controls Indep _14  Assist _15  Dependent/ Unable _16  N/A _17  _18 Safe          _19  Functional      Distance:                Bed confined without wheelchair _20  Yes: on bad days that are spasmatic she is dependent on chair.  _21  No   STRENGTH/RANGE OF MOTION:  Range of Motion Strength  Shoulder     WFL                                                       5/5                                                 Elbow   WFL                                                  5/5                                                           Wrist/Hand  WFL                                                                   5/5  Hip  WFL: slight hip extension limitation on spastic days                                                              2+/5                                                             Knee    [facility]                                                        3/5                                                                  Ankle WFL: tone on spastic days  3/5                                                                      MOBILITY/BALANCE:  _0  Patient is totally dependent for mobility                                                                                               Balance Transfers Ambulation  Sitting Balance: Standing Balance: _1  Independent _2  Independent/Modified Independent  _3  WFL     _4  WFL _5  Supervision _6  Supervision  _7  Uses UE for balance  _8  Supervision _9  Min Assist  _10  Ambulates with Assist                           _11  Min Assist _12  Min assist _13  Mod Assist _14  Ambulates with Device:  _15  RW   _16  StW   _17  Cane   _18                 _19  Mod Assist _20  Mod assist good day _21  Max assist   _22  Max Assist _23  Max assist _24  Dependent _25  Indep. Short Distance Only  _26  Unable _27   Unable: Bad day _28  Lift / Sling Required Distance (in feet)     20 ft on good day;  unable to ambulate on bad day                        _0  Sliding board _1  Unable to Ambulate: (Explain: unable to ambulate on bad day)   Cardio Status:  _2 Intact  _3  Impaired   _4  NA                              Respiratory Status:  _5 Intact   _6 Impaired   _7 NA                                     Orthotics/Prosthetics:  n/a                                                                       Comments (Address manual vs power w/c vs scooter):          Patient is a very pleasant 40 year old female with Hereditary Spastic Paraplegia. Patient has had functional decline in past five years resulting in her limited ability to ambulate (within room only) on a "good day" and inability to ambulate at all on a "bad day". Her "bad days" are frequent with this Pryor Curia witnesses both as her treating therapist. On those days she has increased tone, pain in her bladder, back, and lower extremities, and is unable to ambulate with limited ability to transfer. Her legs tremble and give way under her however her arms remain functional. A manual chair will allow her the freedom to perform ADLs, household tasks such as cooking and cleaning, childcare, and other iADLs. Her upper extremities are 5/5 strength and will allow her to propel herself without assistance.   A manual chair will increase her independence in all facets of life and improve her quality of life.                                                   Anterior / Posterior Obliquity Rotation-Pelvis  PELVIS    _8 Neutral  _9  Posterior  _10  Anterior     _11 WFL  _12 Right Elevated  _13 Left Elevated   _14 WFL  _15 Right Anterior _16   Left Anterior    _17  Fixed _18  Partly Flexible _19  Flexible  _20  Other  _21  Fixed  _22  Partly Flexible  _23  Flexible _24  Other  _25  Fixed  _26  Partly Flexible  _27  Flexible _28  Other  TRUNK _29 WFL _30 Thoracic Kyphosis _31 Lumbar  Lordosis   _32  WFL _33 Convex Right _34 Convex Left   _35 c-curve _36 s-curve _37 multiple  _38  Neutral _39  Left-anterior _40  Right-anterior    _41  Fixed _42  Flexible _43  Partly Flexible       Other  _44  Fixed _45  Flexible _46  Partly Flexible _47  Other  _48  Fixed           _49  Flexible _50  Partly Flexible _51  Other   Position Windswept   HIPS  _52  Neutral _53  Abduct _54  ADduct _55  Neutral _56  Right _57  Left       _58   Fixed  _0  Partly Flexible             _1  Dislocated _2  Flexible _3  Subluxed    _4  Fixed _5  Partly Flexible  _6  Flexible _7  Other              Foot Positioning Knee Positioning   Knees and  Feet  _8  WFL _9 Left _10 Right _11  WFL _12 Left _13 Right   KNEES ROM concerns: ROM concerns:   & Dorsi-Flexed                    _14 Lt _15 Rt                                  FEET Plantar Flexed                  _16 Lt _17 Rt     Inversion                    _18 Lt _19 Rt     Eversion                    _20 Lt _21 Rt    HEAD _22  Functional _23  Good Head Control   & _24  Flexed         _25  Extended _26  Adequate Head Control   NECK _27  Rotated  Lt  _28  Lat Flexed Lt _29  Rotated  Rt _30  Lat Flexed Rt _31  Limited Head Control    _32  Cervical Hyperextension _33  Absent  Head Control    SHOULDERS ELBOWS WRIST& HAND         Left     Right    Left     Right  U/E _34 Functional  Left            _35 Functional  Right                                 _36 Fisting             _37 Fisting     _38 elevated Left _39 depressed  Left _40 elevated Right _41 depressed  Right      _42 protracted Left _43 retracted Left _44 protracted Right _45 retracted Right _46 subluxed  Left              _47 subluxed  Right         Goals for Wheelchair Mobility  _48  Independence with mobility in the home with motor related ADLs (MRADLs)  _49  Independence with MRADLs in the community _50  Provide dependent mobility  _51  Provide recline     _52 Provide tilt   Goals for Seating system _53  Optimize pressure distribution _54  Provide support needed to  facilitate function or safety _55  Provide corrective forces to assist with maintaining or improving posture _56  Accommodate client's posture: current seated postures and positions are not flexible or will not tolerate corrective forces _57  Client to be independent with relieving pressure in the wheelchair _58 Enhance physiological function such as breathing, swallowing, digestion  Simulation ideas/Equipment trials:   [employer] Catalyst 5  State why other equipment was unsuccessful:    Patient is a very pleasant 40 year old female with Hereditary Spastic Paraplegia. Patient has had functional decline in past five years resulting in her limited ability to ambulate (within room only) on a "good day" and inability to ambulate at all on a "bad day". Her "bad days" outnumber the good with this Chief Strategy Officer witnesses both as her treating therapist. On those days she has increased tone in her lower extremities and bladder, pain in her bladder, back, and lower extremities, and is unable to ambulate with limited ability to transfer. Her legs tremble and give way under her however her arms remain functional.  She is dependent on family to assist her moving and is stuck in a chair or bed due to her lower extremity weakness. Her arm strength is 5/5 and she is able to safely propel a manual chair.  The [employer] Catalyst 5 manual chair will allow her the freedom to perform ADLs, household tasks such as cooking and cleaning, childcare, and other iADLs.  She will require the lighter weight for propulsion, lifting into a  car, and for transportation as members of her church assist with driving to physician appointments as she is not driving at this time.   A tension adjustable back will reduce back pain and provide support on days she has more spasticity.     Adjustable height, removable tubular pads will allow for arm support when needed as  well as transfer assistance but can be removed for table top access and other iADL needs.    The axiom G seat cushion will be low maintenance and allow for pressure distribution and comfort with prolonged sitting.   The leg rests will be 80 degree, lift off and swing away for transfers, positioning in chair, and optimal body mechanics.  The depth/angle adjustment foot plates will allow for positioning with and without tone as well as the heel loops to keep LE positioning.  Pneumatic tires will improve smoothness of ride.    Fully adjustable rear wheel placement will allow for optimal body mechanics and mobility with self propulsion.    Extended push handles will allow for assistance from family when patient fatigues as well as a place to put her ostomy bag for ADL performance.   Scissor break locks will allow for fluidity of UE propulsion.   Side guards will prevent tearing of clothes and/or skin protecting the patient.   She will additionally benefit from rear anti-tips for safety and casters. In conclusion the [employer] catalyst 5 manual wheelchair is the best option for this patient to increase her independence with ADLs, iADLs and improve her quality of life.                                                    MOBILITY BASE RECOMMENDATIONS and JUSTIFICATION: MOBILITY COMPONENT JUSTIFICATION  Manufacturer:  [employer]         Model:   Catalyst 5            Size: Width   18        Seat Depth    19         _0 provide transport from point A to B _1 promote Indep mobility  _2 is not a safe, functional ambulator _3 walker or cane inadequate _4 non-standard width/depth necessary to  accommodate anatomical measurement _0                             _1 Manual Mobility Base _2 non-functional ambulator    _3 Scooter/POV  _4 can safely operate  _5 can safely transfer   _6 has adequate trunk stability  _7 cannot functionally propel manual w/c  _8 Power Mobility Base  _9 non-ambulatory  _10 cannot functionally  propel manual wheelchair  _11  cannot functionally and safely operate scooter/POV _12 can safely operate and willing to  _13 Stroller Base _14 infant/child  _15 unable to propel manual wheelchair _16 allows for growth _17 non-functional ambulator _18 non-functional UE _19 Indep mobility is not a goal at this time  _20 Tilt  _21 Forward                   _22 Backward                  _23 Powered tilt              _24 Manual tilt  _25 change position against gravitational force on head and shoulders  _26 change position for pressure relief/cannot weight shift _27 transfers  _28 management of tone _29 rest periods _30 control edema _31 facilitate postural control  _32                                       _33 Recline  _34 Power recline on power base _35 Manual recline on manual base  _36 accommodate femur to back angle  _37 bring to full recline for ADL care  _38 change position for pressure relief/cannot weight shift _39 rest periods _40 repositioning for transfers or clothing/diaper /catheter changes _41 head positioning  _42 Lighter weight required _43 self- propulsion  _44 lifting _45                                                 _46 Heavy Duty required _47 user weight greater than 250# _48 extreme tone/ over active movement _49 broken frame on previous chair _50                                     _51  Back  _52  Tension Adjustable _53  Custom molded                            _54 postural control _55 control of tone/spasticity _56 accommodation of range of motion _57 UE functional control _58 accommodation for seating system _59                                          _60 provide lateral trunk support _61 accommodate deformity _62 provide posterior trunk support _63 provide lumbar/sacral support _64 support trunk in midline _65 Pressure relief over spinal processes  _66  Seat Cushion: axiom G                        _67 impaired sensation  _68 decubitus ulcers present _69 history of pressure ulceration _70 prevent pelvic extension _71 low maintenance  _72 stabilize pelvis   _73 accommodate obliquity _74 accommodate multiple deformity _75 neutralize lower extremity position _76 increase pressure distribution _77                                           _78   Pelvic/thigh support  _0  Lateral thigh guide _1  Distal medial pad  _2  Distal lateral pad _3  pelvis in neutral _4 accommodate pelvis _5  position upper legs _6  alignment _7  accommodate ROM _8  decrease adduction _9 accommodate tone _10 removable for transfers _11 decrease abduction  _12  Lateral trunk Supports _13  Lt     _14  Rt _15 decrease lateral trunk leaning _16 control tone _17 contour for increased contact _18 safety  _19 accommodate asymmetry _20                                                 _21  Mounting hardware  _22 lateral trunk supports  _23 back   _24 seat _25 breaks     _26  thigh support _27 fixed   _28 swing away _29 attach seat platform/cushion to w/c frame _30 attach back cushion to w/c frame _31 mount postural supports _32 mount headrest  _33 swing medial thigh support away _34 swing lateral supports away for transfers  _35                                                     Armrests  _36 fixed _37 adjustable height _38 removable   _39 swing away  _40 flip back   _41 reclining _42 full length pads _43 desk    _44 pads tubular  _45 provide support with elbow at 90   _46 provide support for w/c tray _47 change of height/angles for variable activities _48 remove for transfers _49 allow to come closer to table top _50 remove for access to tables _51                                               Hangers/ Leg rests  _52 60 _53 70 _54 80 _55 elevating _56 heavy duty  _57 articulating _58 fixed _59 lift off _60 swing away     _61 power _62 provide LE support  _63 accommodate to hamstring tightness _64 elevate legs during recline   _65 provide change in position for Legs _66 Maintain placement of feet on footplate _67 durability _68 enable transfers _69 decrease edema _70 Accommodate lower leg length _71                                         Foot support Footplate    <GLOVFIEPPIRJJOAC>_1<\/YSAYTKZSWFUXNATF>_57 Lt  _73   Rt  _74  Center mount _75 flip up                            _76 depth/angle adjustable _77 Amputee adapter    _78  Lt     _79  Rt _80 provide foot support _81 accommodate to ankle ROM _82 transfers _83 Provide support for residual extremity _84  allow foot to go under wheelchair base _85  decrease tone  _86                                                 _87  Ankle strap/heel loops _88 support foot on foot support _89 decrease extraneous movement _90 provide input to heel  _91 protect foot  Tires: _92 pneumatic  _93 flat free inserts  _94 solid  _95 decrease maintenance  _96 prevent frequent flats _97 increase shock absorbency _98 decrease pain from road  shock _0 decrease spasms from road shock _1                                              _2  Headrest  _3 provide posterior head support _4 provide posterior neck support _5 provide lateral head support _6 provide anterior head support _7 support during tilt and recline _8 improve feeding   _9 improve respiration _10 placement of switches _11 safety  _12 accommodate ROM  _13 accommodate tone _14 improve visual orientation  _15  Anterior chest strap _16  Vest _17  Shoulder retractors  _18 decrease forward movement of shoulder _19 accommodation of TLSO _20 decrease forward movement of trunk _21 decrease shoulder elevation _22 added abdominal support _23 alignment _24 assistance with shoulder control  _25                                               Pelvic Positioner _26 Belt _27 SubASIS bar _28 Dual Pull _29 stabilize tone _30 decrease falling out of chair/ **will not Decrease potential for sliding due to pelvic tilting _31 prevent excessive rotation _32 pad for protection over boney prominence _33 prominence comfort _34 special pull angle to control rotation _35                                                  Upper ExtremitySupport  _36 L   _37  R _38 Arm trough   _39 hand support _40  tray       _41 full tray _42 swivel mount _43 decrease edema      _44 decrease subluxation   _45 control tone   _46 placement for  AAC/Computer/EADL _47 decrease gravitational pull on shoulders _48 provide midline positioning _49 provide support to increase UE function _50 provide hand support in natural position _51 provide work surface   POWER WHEELCHAIR CONTROLS  _52 Proportional  _53 Non-Proportional Type                                      _54 Left  _55 Right _56 provides access for controlling wheelchair   _57 lacks motor control to operate proportional drive control <CLEXNTZGYFVCBSWH>_6<\/PRFFMBWGYKZLDJTT>_01 unable to understand proportional controls  Actuator Control Module  _59 Single  _60 Multiple   _61 Allow the client to operate the power seat function(s) through the joystick control   _62 Safety Reset Switches _63 Used to change modes and stop the wheelchair when driving in latch mode    _64 [employer]   _65 programming for accurate control _66 progressive Disease/changing condition _67 non-proportional drive control needed _68 Needed in order to operate power seat functions through joystick control   _69 Display box _70 Allows user to see in which mode and drive the wheelchair is set  _71 necessary for alternate controls    _72 Digital interface electronics _73 Allows w/c to operate when using alternative drive controls  <XBLTJQZESPQZRAQT>_6<\/AUQJFHLKTGYBWLSL>_37 ASL Head Array _75 Allows client to operate wheelchair  through switches placed in tri-panel headrest  _76 Sip and puff with tubing kit _77 needed to operate sip and puff drive controls  <DSKAJGOTLXBWIOMB>_5<\/DHRCBULAGTXMIWOE>_32 Upgraded tracking electronics _79 increase safety when driving <ZYYQMGNOIBBCWUGQ>_9<\/VQXIHWTUUEKCMKLK>_91 correct tracking when on uneven surfaces  _81 [facility] for switches or joystick _82 Attaches switches to w/c  _83 Swing away for access or transfers _84 midline for optimal placement _85 provides for consistent access  _86 Attendant controlled joystick plus mount _87 safety _88 long distance driving <PHXTAVWPVXYIAXKP>_5<\/VZSMOLMBEMLJQGBE>_01 operation of seat functions _90 compliance with transportation regulations _91   Rear wheel placement/Axle adjustability _0 None _1 semi adjustable _2 fully adjustable  _3 improved UE access to  wheels _4 improved stability _5 changing angle in space for improvement of postural stability _6 1-arm drive access <NUUVOZDGUYQIHKVQ>_2<\/VZDGLOVFIEPPIRJJ>_8 amputee pad placement _8                                Wheel rims/ hand rims  _9 metal   _10 plastic coated _11 oblique projections           _12 vertical projections _13 Provide ability to propel manual wheelchair  _14  Increase self-propulsion with hand weakness/decreased grasp  Push handles _15 extended   _16 angle adjustable              _17 standard _18 caregiver access _19 caregiver assist _20 allows "hooking" to enable increased ability to perform ADLs or maintain balance  One armed device   _21 Lt   _22 Rt _23 enable propulsion of manual wheelchair with one arm   _24                                            Brake/wheel lock extension _25  Lt   _26  Rt: scissor locks  _27 increase indep in applying wheel locks   _28 Side guards _29 prevent clothing getting caught in wheel or becoming soiled _30  prevent skin tears/abrasions  Battery:                                            _31 to power wheelchair                                                         Other:    Rear anti-tips Casters: 6x1.5; x2                                                                                                                      The above equipment has a life- long use expectancy. Growth and changes in medical and/or functional conditions would be the exceptions. This is to certify that the therapist has no financial relationship with durable medical provider or manufacturer. The therapist will not receive remuneration of any kind for the equipment recommended in this evaluation.   Patient has mobility limitation that significantly impairs safe, timely participation in one or more mobility related ADL's. (bathing, toileting, feeding, dressing, grooming, moving from room to room)  _32  Yes _33  No  Will mobility device sufficiently improve ability to participate and/or be aided in participation of MRADL's?      _34   Yes _35  No  Can limitation be compensated for with use of a cane or walker?                                    _36   Yes _0  No  Does patient or caregiver demonstrate ability/potential ability & willingness to safely use the mobility device?    _1  Yes _2  No  Does patient's home environment support use of recommended mobility device?            _3  Yes _4  No  Does patient have sufficient upper extremity function necessary to functionally propel a manual wheelchair?     _5  Yes _6  No  Does patient have sufficient strength and trunk stability to safely operate a POV (scooter)?                                  _7  Yes _8  No  Does patient need additional features/benefits provided by a power wheelchair for MRADL's in the home?        _9  Yes _10  No  Does the patient demonstrate the ability to safely use a power wheelchair?                   _11  Yes _12  No     Physician's Name Printed:                                                        Physician's Signature:  Date:     This is to certify that I, the above signed therapist have the following affiliations: _13  This DME provider _14  Manufacturer of recommended equipment _15  Patient's long term care facility _16  None of the above  Therapist Name/Signature:     Janna Arch, PT, DPT                                        Date: 10/14/20       Objective measurements completed on examination: See above findings.               PT Education - 10/14/20 1735     Education Details wheelchair evaluation    Person(s) Educated Patient    Methods Explanation    Comprehension Verbalized understanding              PT Short Term Goals - 10/14/20 1739       PT SHORT TERM GOAL #1   Title Pt and caregivers will understand PT recommendation and appropriate/safe use for wheelchair and seating for home use.    Baseline met    Time 1    Period Days    Status Achieved               PT Long Term Goals - 10/14/20 1740        PT LONG TERM GOAL #1   Title Pt and caregivers will understand PT recommendation and appropriate/safe use for wheelchair and seating for home use.    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 Patient has had functional decline in past five years resulting  in her limited ability to ambulate (within room only) on a "good day" and inability to ambulate at all on a "bad day". Her "bad days" outnumber the good with this Chief Strategy Officer witnesses both as her treating therapist. On those days she has increased tone in her lower extremities and bladder, pain in her bladder, back, and lower extremities, and is unable to ambulate with limited ability to transfer. Her legs tremble and give way under her however her arms remain functional.  She is dependent on family to assist her moving and is stuck in a chair or bed due to her lower extremity weakness. Her arm strength is 5/5 and she is able to safely propel a manual chair.  The [employer]yst 5 manual chair will allow her the freedom to perform ADLs, household tasks such as cooking and cleaning, childcare, and other iADLs.  She will require the lighter weight for propulsion, lifting into a  car, and for transportation as members of her church assist with driving to physician appointments as she is not driving at this time.   A tension adjustable back will reduce back pain and provide support on days she has more spasticity. In conclusion the Ki mobility catalyst 5 manual wheelchair is the best option for this patient to increase her independence with ADLs, iADLs and improve her quality of life.    Personal Factors and Comorbidities Comorbidity 3+;Transportation;Time since onset of injury/illness/exacerbation;Finances    Comorbidities neurologic insult with pending diagnosis, multiple  recurrent UTI's, BLE weakness, depression, hereditary spastic paraplegia    Examination-Activity Limitations Caring for Others;Carry;Continence;Squat;Stairs;Toileting;Locomotion Level;Lift;Stand;Bed Mobility;Bend;Reach Overhead;Sit;Dressing;Transfers    Examination-Participation Restrictions Church;Cleaning;Community Activity;Driving;Interpersonal Relationship;Laundry;Yard Work;Meal Prep;School;Volunteer    Stability/Clinical Decision Making Unstable/Unpredictable    Clinical Decision Making High    Rehab Potential Fair    PT Frequency One time visit             Patient will benefit from skilled therapeutic intervention in order to improve the following deficits and impairments:     Visit Diagnosis: Muscle weakness (generalized)  Other abnormalities of gait and mobility     Problem List Patient Active Problem List   Diagnosis Date Noted   Seizure ([place]) 11/11/2018   Major depressive disorder, recurrent episode, moderate ([place]) 02/07/2018   Nephrolithiasis 04/16/2016   Numbness 07/28/2015   Bladder retention 06/23/2015   Abdominal pain 06/04/2015   Dizziness 05/18/2015   Neck pain 53/97/6734   Complicated migraine 19/37/9024   Other fatigue 04/28/2015   Abnormal finding on MRI of brain 04/28/2015   D (diarrhea) 03/29/2015   H/O disease 03/29/2015   Abnormal weight loss 03/29/2015   Muscle weakness (generalized) 03/14/2015   Headache, migraine 03/10/2015    Janna Arch, PT, DPT  10/14/2020, 5:42 PM  [place] [facility] SERVICES [address] [place], [place], [address] Phone: [phone]   Fax:  [phone]  Name: Taylormarie Register MRN: [id number] Date of Birth: 1981/02/08

## 2020-10-20 ENCOUNTER — Ambulatory Visit: Payer: BC Managed Care – PPO | Attending: Gastroenterology

## 2020-10-20 ENCOUNTER — Other Ambulatory Visit: Payer: Self-pay

## 2020-10-20 DIAGNOSIS — R269 Unspecified abnormalities of gait and mobility: Secondary | ICD-10-CM | POA: Diagnosis present

## 2020-10-20 DIAGNOSIS — M62838 Other muscle spasm: Secondary | ICD-10-CM | POA: Insufficient documentation

## 2020-10-20 DIAGNOSIS — M6281 Muscle weakness (generalized): Secondary | ICD-10-CM | POA: Diagnosis present

## 2020-10-20 DIAGNOSIS — R2689 Other abnormalities of gait and mobility: Secondary | ICD-10-CM | POA: Diagnosis present

## 2020-10-20 DIAGNOSIS — R262 Difficulty in walking, not elsewhere classified: Secondary | ICD-10-CM | POA: Insufficient documentation

## 2020-10-20 NOTE — Therapy (Signed)
[place] MAIN [facility] SERVICES [address] [place], [place], [address] Phone: [phone]   Fax:  [phone]  Physical Therapy Treatment  Patient Details  Name: Christine Chavez MRN: [id number] Date of Birth: 01/15/1981 Referring Provider (PT): Elza Rafter   Encounter Date: 10/20/2020   PT End of Session - 10/20/20 1236     Visit Number 113    Number of Visits 131    Date for PT Re-Evaluation 12/09/20    Authorization Type 3/10 PN 6/20    Authorization Time Period 06/28/20-09/20/20    Authorization - Visit Number 13    Authorization - Number of Visits 24    PT Start Time 8250    PT Stop Time 1059    PT Time Calculation (min) 44 min    Equipment Utilized During Treatment Gait belt    Activity Tolerance Patient tolerated treatment well;No increased pain    Behavior During Therapy WFL for tasks assessed/performed             Past Medical History:  Diagnosis Date   Complication of anesthesia    ? seizures after anesthesia    Headache    Migraines    Neurogenic bladder    Renal disorder    Vision abnormalities     Past Surgical History:  Procedure Laterality Date   ANTERIOR CRUCIATE LIGAMENT REPAIR  1997   APPENDECTOMY     COLONOSCOPY WITH PROPOFOL N/A 11/11/2018   Procedure: COLONOSCOPY WITH PROPOFOL;  Surgeon: [name], MD;  Location: [facility] ENDOSCOPY;  Service: Endoscopy;  Laterality: N/A;   CYSTOSCOPY WITH STENT PLACEMENT Right 04/17/2016   Procedure: CYSTOSCOPY WITH STENT PLACEMENT;  Surgeon: [name], MD;  Location: [facility] ORS;  Service: Urology;  Laterality: Right;   ESOPHAGOGASTRODUODENOSCOPY (EGD) WITH PROPOFOL N/A 11/11/2018   Procedure: ESOPHAGOGASTRODUODENOSCOPY (EGD) WITH PROPOFOL;  Surgeon: [name], MD;  Location: [facility] ENDOSCOPY;  Service: Endoscopy;  Laterality: N/A;   EXPLORATORY LAPAROTOMY  1999   KIDNEY STONE SURGERY  04/2016   REVISION UROSTOMY CUTANEOUS     REVISION  UROSTOMY CUTANEOUS  01/10/2018   SUPRAPUBIC CATHETER PLACEMENT  08/2017   TONSILLECTOMY      There were no vitals filed for this visit.   Subjective Assessment - 10/20/20 1235     Subjective Patient reports increased spasms in her bladder today. Has started her cycle and it is affecting her. WIll see neurologist next week prior to PT session.    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 Due to the rarity of her disorder it took years prior to diagnosis. 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  Patient has good days and bad days. Her good days allow for short ambulation (within room only not between rooms) and her bad days she is unable to ambulate.    Limitations Lifting;Standing;Walking;House hold activities;Other (comment)    Patient Stated Goals to regain PLOF    Currently in Pain? Yes    Pain Score 7     Pain Location Back    Pain Orientation Lower    Pain Descriptors / Indicators Aching;Tightness    Pain Type Neuropathic pain    Pain Onset More than a month ago    Pain Frequency Constant  Treatment: Supine with heat pad on back: LE rotation 10x each LE low back pain relieving ; x 2 sets  Hamstring stretch on PT shoulder with increasing range of motion with time 60 seconds each LE Sciatic nerve glide with leg on PT shoulder 30 seconds each LE Piriformis stretch 60 seconds each LE Cross body internal rotation hip stretch with PT overpressure 60 seconds each LE BTB around bilateral knees: abduction 15x BTB around bilateral knees; flexion with posterior pelvic tilt 12x each LE Straight leg abduction 10x each LE  Open book stretch with PT overpressure 4x 20  seconds each side  Modified bridge 10x BLE rotation 12x with posterior pelvic tilt   Seated: LAQ 12x each LE   RTB hamstring curl 12x each LE RTB adduction 12x each LE         Pt educated throughout session about proper posture and technique with exercises. Improved exercise technique, movement at target joints, use of target muscles after min to mod verbal, visual, tactile cues    Patient has increased tone and tremors this session that are reduced by end of session. Education on recent wheelchair eval performed with patient verbalizing understanding. Patient is very motivated throughout session and is eager to progress. Patient will continue to benefit from skilled physical therapy to increase strength, functional mobility and activity tolerance for optimal functional independence in home and community and improved quality of life                    PT Education - 10/20/20 1236     Education Details exercise technique, manual    Person(s) Educated Patient    Methods Explanation;Demonstration;Tactile cues;Verbal cues    Comprehension Verbalized understanding;Returned demonstration;Verbal cues required;Tactile cues required              PT Short Term Goals - 10/14/20 1739       PT SHORT TERM GOAL #1   Title Pt and caregivers will understand PT recommendation and appropriate/safe use for wheelchair and seating for home use.    Baseline met    Time 1    Period Days    Status Achieved               PT Long Term Goals - 10/14/20 1740       PT LONG TERM GOAL #1   Title Pt and caregivers will understand PT recommendation and appropriate/safe use for wheelchair and seating for home use.    Baseline MET    Time 1    Period Days    Status Achieved                   Plan - 10/20/20 1257     Clinical Impression Statement Patient has increased tone and tremors this session that are reduced by end of session. Education on recent wheelchair  eval performed with patient verbalizing understanding. Patient is very motivated throughout session and is eager to progress. Patient will continue to benefit from skilled physical therapy to increase strength, functional mobility and activity tolerance for optimal functional independence in home and community and improved quality of life    Personal Factors and Comorbidities Comorbidity 3+;Transportation;Time since onset of injury/illness/exacerbation;Finances    Comorbidities neurologic insult with pending diagnosis, multiple recurrent UTI's, BLE weakness, depression, hereditary spastic paraplegia    Examination-Activity Limitations Caring for Others;Carry;Continence;Squat;Stairs;Toileting;Locomotion Level;Lift;Stand;Bed Mobility;Bend;Reach Overhead;Sit;Dressing;Transfers    Examination-Participation Restrictions Church;Cleaning;Community Activity;Driving;Interpersonal Relationship;Laundry;[employer] Work;Meal Prep;School;Volunteer    Stability/Clinical Decision Making Unstable/Unpredictable  Rehab Potential Fair    PT Frequency 2x / week    PT Duration 12 weeks    PT Treatment/Interventions ADLs/Self Care Home Management;Aquatic Therapy;Biofeedback;Moist Heat;Electrical Stimulation;Cryotherapy;Traction;Ultrasound;Therapeutic activities;Functional mobility training;Stair training;Gait training;Therapeutic exercise;Balance training;Neuromuscular re-education;Patient/family education;Manual techniques;Dry needling;Scar mobilization;Energy conservation;Taping;Joint Manipulations;Spinal Manipulations;Visual/perceptual remediation/compensation;Passive range of motion;Wheelchair mobility training;DME Instruction;Orthotic Fit/Training    PT Next Visit Plan transfers, stabilization in standing, core activation    PT Home Exercise Plan no changes    Consulted and Agree with Plan of Care Patient             Patient will benefit from skilled therapeutic intervention in order to improve the following deficits  and impairments:  Abnormal gait, Decreased balance, Decreased endurance, Decreased mobility, Difficulty walking, Increased muscle spasms, Impaired sensation, Improper body mechanics, Impaired tone, Decreased activity tolerance, Decreased coordination, Decreased strength, Postural dysfunction, Pain, Impaired flexibility  Visit Diagnosis: Muscle weakness (generalized)  Other abnormalities of gait and mobility  Abnormality of gait and mobility     Problem List Patient Active Problem List   Diagnosis Date Noted   Seizure ([place]) 11/11/2018   Major depressive disorder, recurrent episode, moderate ([facility]) 02/07/2018   Nephrolithiasis 04/16/2016   Numbness 07/28/2015   Bladder retention 06/23/2015   Abdominal pain 06/04/2015   Dizziness 05/18/2015   Neck pain 04/19/7251   Complicated migraine 66/44/0347   Other fatigue 04/28/2015   Abnormal finding on MRI of brain 04/28/2015   D (diarrhea) 03/29/2015   H/O disease 03/29/2015   Abnormal weight loss 03/29/2015   Muscle weakness (generalized) 03/14/2015   Headache, migraine 03/10/2015    Janna Arch, PT, DPT  10/20/2020, 12:58 PM  [place] [facility]IN [facility] SERVICES [address] [place], [place], [address] Phone: [phone]   Fax:  [phone]  Name: Christine Chavez MRN: [id number] Date of Birth: [birth year]

## 2020-10-25 ENCOUNTER — Ambulatory Visit: Payer: BC Managed Care – PPO

## 2020-10-25 ENCOUNTER — Other Ambulatory Visit: Payer: Self-pay

## 2020-10-25 DIAGNOSIS — R269 Unspecified abnormalities of gait and mobility: Secondary | ICD-10-CM

## 2020-10-25 DIAGNOSIS — M6281 Muscle weakness (generalized): Secondary | ICD-10-CM

## 2020-10-25 DIAGNOSIS — R2689 Other abnormalities of gait and mobility: Secondary | ICD-10-CM

## 2020-10-25 NOTE — Therapy (Signed)
[place] MAIN [facility] SERVICES [address] [place], [place], [address] Phone: [phone]   Fax:  [phone]  Physical Therapy Treatment  Patient Details  Name: Christine Chavez MRN: [id number] Date of Birth: [birth year] Referring Provider (PT): Elza Rafter   Encounter Date: 10/25/2020   PT End of Session - 10/26/20 0716     Visit Number 114    Number of Visits 131    Date for PT Re-Evaluation 12/09/20    Authorization Type 4/10 PN 6/20    Authorization Time Period 06/28/20-09/20/20    Authorization - Visit Number 13    Authorization - Number of Visits 24    PT Start Time 1103    PT Stop Time 1730    PT Time Calculation (min) 46 min    Equipment Utilized During Treatment Gait belt    Activity Tolerance Patient tolerated treatment well;No increased pain    Behavior During Therapy WFL for tasks assessed/performed             Past Medical History:  Diagnosis Date   Complication of anesthesia    ? seizures after anesthesia    Headache    Migraines    Neurogenic bladder    Renal disorder    Vision abnormalities     Past Surgical History:  Procedure Laterality Date   ANTERIOR CRUCIATE LIGAMENT REPAIR  1997   APPENDECTOMY     COLONOSCOPY WITH PROPOFOL N/A 11/11/2018   Procedure: COLONOSCOPY WITH PROPOFOL;  Surgeon: [name], MD;  Location: [facility] ENDOSCOPY;  Service: Endoscopy;  Laterality: N/A;   CYSTOSCOPY WITH STENT PLACEMENT Right 04/17/2016   Procedure: CYSTOSCOPY WITH STENT PLACEMENT;  Surgeon: [name], MD;  Location: [facility] ORS;  Service: Urology;  Laterality: Right;   ESOPHAGOGASTRODUODENOSCOPY (EGD) WITH PROPOFOL N/A 11/11/2018   Procedure: ESOPHAGOGASTRODUODENOSCOPY (EGD) WITH PROPOFOL;  Surgeon: [name], MD;  Location: [facility] ENDOSCOPY;  Service: Endoscopy;  Laterality: N/A;   EXPLORATORY LAPAROTOMY  1999   KIDNEY STONE SURGERY  04/2016   REVISION UROSTOMY CUTANEOUS     REVISION  UROSTOMY CUTANEOUS  01/10/2018   SUPRAPUBIC CATHETER PLACEMENT  08/2017   TONSILLECTOMY      There were no vitals filed for this visit.   Subjective Assessment - 10/26/20 0714     Subjective Patient went to neurologist earlier today. Will be having medication changes due to spasticity in bladder. Discussed getting a case Freight forwarder with physician who agreed. Reports her vision has been giving her difficulty as well lately.    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 Due to the rarity of her disorder it took years prior to diagnosis. 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  Patient has good days and bad days. Her good days allow for short ambulation (within room only not between rooms) and her bad days she is unable to ambulate.    Limitations Lifting;Standing;Walking;House hold activities;Other (comment)    Patient Stated Goals to regain PLOF    Currently in Pain? Yes    Pain Score 8     Pain Location Back    Pain Orientation Lower    Pain Descriptors / Indicators Aching    Pain Onset More than a month ago  Pain Frequency Constant                 Treatment: Supine with heat pad on abdomen due to cramping LE rotation 10x each LE low back pain relieving ; x 2 sets  Hamstring stretch on PT shoulder with increasing range of motion with time 60 seconds each LE Sciatic nerve glide with leg on PT shoulder 30 seconds each LE Piriformis stretch 60 seconds each LE Cross body internal rotation hip stretch with PT overpressure 60 seconds each LE GTB around bilateral knees: abduction 20x GTB around bilateral knees; flexion with posterior pelvic tilt 12x each LE Open book stretch with  PT overpressure 4x 20 seconds each side  Heel slide AAROM 12x each LE PVC pipe overhead raise for core contraction 12x PVC pipe alternating raises for core stabilization 8x each side    Seated: LAQ 12x each LE   RTB hamstring curl 12x each LE RTB adduction 12x each LE    Visual tracking of X with VOR x1; added to HEP due to visual deficits        Pt educated throughout session about proper posture and technique with exercises. Improved exercise technique, movement at target joints, use of target muscles after min to mod verbal, visual, tactile cues  Patient introduced to visual scanning VOR technique and added to her HEP due to visual impairments. Continued focus on stabilization and reduction of spasticity performed with patient tolerating session well despite fatigue. Patient will continue to benefit from skilled physical therapy to increase strength, functional mobility and activity tolerance for optimal functional independence in home and community and improved quality of life                    PT Education - 10/26/20 0716     Education Details visual tracking, exercise technique    Person(s) Educated Patient    Methods Explanation;Demonstration;Tactile cues;Verbal cues    Comprehension Verbalized understanding;Returned demonstration;Verbal cues required;Tactile cues required              PT Short Term Goals - 10/14/20 1739       PT SHORT TERM GOAL #1   Title Pt and caregivers will understand PT recommendation and appropriate/safe use for wheelchair and seating for home use.    Baseline met    Time 1    Period Days    Status Achieved               PT Long Term Goals - 10/14/20 1740       PT LONG TERM GOAL #1   Title Pt and caregivers will understand PT recommendation and appropriate/safe use for wheelchair and seating for home use.    Baseline MET    Time 1    Period Days    Status Achieved                   Plan - 10/26/20  0719     Clinical Impression Statement Patient introduced to visual scanning VOR technique and added to her HEP due to visual impairments. Continued focus on stabilization and reduction of spasticity performed with patient tolerating session well despite fatigue. Patient will continue to benefit from skilled physical therapy to increase strength, functional mobility and activity tolerance for optimal functional independence in home and community and improved quality of life    Personal Factors and Comorbidities Comorbidity 3+;Transportation;Time since onset of injury/illness/exacerbation;Finances    Comorbidities neurologic insult with pending diagnosis, multiple recurrent  UTI's, BLE weakness, depression, hereditary spastic paraplegia    Examination-Activity Limitations Caring for Others;Carry;Continence;Squat;Stairs;Toileting;Locomotion Level;Lift;Stand;Bed Mobility;Bend;Reach Overhead;Sit;Dressing;Transfers    Examination-Participation Restrictions Church;Cleaning;Community Activity;Driving;Interpersonal Relationship;Laundry;[employer] Work;Meal Prep;School;Volunteer    Stability/Clinical Decision Making Unstable/Unpredictable    Rehab Potential Fair    PT Frequency 2x / week    PT Duration 12 weeks    PT Treatment/Interventions ADLs/Self Care Home Management;Aquatic Therapy;Biofeedback;Moist Heat;Electrical Stimulation;Cryotherapy;Traction;Ultrasound;Therapeutic activities;Functional mobility training;Stair training;Gait training;Therapeutic exercise;Balance training;Neuromuscular re-education;Patient/family education;Manual techniques;Dry needling;Scar mobilization;Energy conservation;Taping;Joint Manipulations;Spinal Manipulations;Visual/perceptual remediation/compensation;Passive range of motion;Wheelchair mobility training;DME Instruction;Orthotic Fit/Training    PT Next Visit Plan transfers, stabilization in standing, core activation    PT Home Exercise Plan no changes    Consulted and Agree with  Plan of Care Patient             Patient will benefit from skilled therapeutic intervention in order to improve the following deficits and impairments:  Abnormal gait, Decreased balance, Decreased endurance, Decreased mobility, Difficulty walking, Increased muscle spasms, Impaired sensation, Improper body mechanics, Impaired tone, Decreased activity tolerance, Decreased coordination, Decreased strength, Postural dysfunction, Pain, Impaired flexibility  Visit Diagnosis: Muscle weakness (generalized)  Other abnormalities of gait and mobility  Abnormality of gait and mobility     Problem List Patient Active Problem List   Diagnosis Date Noted   Seizure ([place]) 11/11/2018   Major depressive disorder, recurrent episode, moderate (HCC) 02/07/2018   Nephrolithiasis 04/16/2016   Numbness 07/28/2015   Bladder retention 06/23/2015   Abdominal pain 06/04/2015   Dizziness 05/18/2015   Neck pain 84/16/6063   Complicated migraine 01/60/1093   Other fatigue 04/28/2015   Abnormal finding on MRI of brain 04/28/2015   D (diarrhea) 03/29/2015   H/O disease 03/29/2015   Abnormal weight loss 03/29/2015   Muscle weakness (generalized) 03/14/2015   Headache, migraine 03/10/2015   Janna Arch, PT, DPT  10/26/2020, 7:20 AM  [place] MAIN [facility] SERVICES [address] [place], [place], [address] Phone: [phone]   Fax:  [phone]  Name: Marabella Popiel MRN: [id number] Date of Birth: Aug 12, 1980

## 2020-10-28 ENCOUNTER — Ambulatory Visit: Payer: BC Managed Care – PPO

## 2020-11-01 ENCOUNTER — Ambulatory Visit: Payer: BC Managed Care – PPO

## 2020-11-02 ENCOUNTER — Ambulatory Visit: Payer: BC Managed Care – PPO

## 2020-11-02 ENCOUNTER — Other Ambulatory Visit: Payer: Self-pay

## 2020-11-02 DIAGNOSIS — M6281 Muscle weakness (generalized): Secondary | ICD-10-CM

## 2020-11-02 DIAGNOSIS — R269 Unspecified abnormalities of gait and mobility: Secondary | ICD-10-CM

## 2020-11-02 DIAGNOSIS — R2689 Other abnormalities of gait and mobility: Secondary | ICD-10-CM

## 2020-11-02 NOTE — Therapy (Signed)
[place] MAIN [facility] SERVICES [address] [place], [place], [address] Phone: [phone]   Fax:  [phone]  Physical Therapy Treatment  Patient Details  Name: Christine Chavez MRN: [id number] Date of Birth: 07-02-1980 Referring Provider (PT): Elza Rafter   Encounter Date: 11/02/2020   PT End of Session - 11/02/20 1630     Visit Number 115    Number of Visits 131    Date for PT Re-Evaluation 12/09/20    Authorization Type 5/10 PN 6/20    Authorization Time Period 06/28/20-09/20/20    PT Start Time 1345    PT Stop Time 1429    PT Time Calculation (min) 44 min    Equipment Utilized During Treatment Gait belt    Activity Tolerance Patient tolerated treatment well;No increased pain    Behavior During Therapy WFL for tasks assessed/performed             Past Medical History:  Diagnosis Date   Complication of anesthesia    ? seizures after anesthesia    Headache    Migraines    Neurogenic bladder    Renal disorder    Vision abnormalities     Past Surgical History:  Procedure Laterality Date   ANTERIOR CRUCIATE LIGAMENT REPAIR  1997   APPENDECTOMY     COLONOSCOPY WITH PROPOFOL N/A 11/11/2018   Procedure: COLONOSCOPY WITH PROPOFOL;  Surgeon: [name], MD;  Location: [facility] ENDOSCOPY;  Service: Endoscopy;  Laterality: N/A;   CYSTOSCOPY WITH STENT PLACEMENT Right 04/17/2016   Procedure: CYSTOSCOPY WITH STENT PLACEMENT;  Surgeon: [name], MD;  Location: [facility] ORS;  Service: Urology;  Laterality: Right;   ESOPHAGOGASTRODUODENOSCOPY (EGD) WITH PROPOFOL N/A 11/11/2018   Procedure: ESOPHAGOGASTRODUODENOSCOPY (EGD) WITH PROPOFOL;  Surgeon: [name], MD;  Location: [facility] ENDOSCOPY;  Service: Endoscopy;  Laterality: N/A;   EXPLORATORY LAPAROTOMY  1999   KIDNEY STONE SURGERY  04/2016   REVISION UROSTOMY CUTANEOUS     REVISION UROSTOMY CUTANEOUS  01/10/2018   SUPRAPUBIC CATHETER PLACEMENT  08/2017    TONSILLECTOMY      There were no vitals filed for this visit.   Subjective Assessment - 11/02/20 1630     Subjective Patient went to ED for potential infection, had additional imaging since last seen due to lapse of ability to speak and walk which has resolved while in ER. No positive findings in imaging found. Will see neurologist tomorrow to discuss further.    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 Due to the rarity of her disorder it took years prior to diagnosis. 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  Patient has good days and bad days. Her good days allow for short ambulation (within room only not between rooms) and her bad days she is unable to ambulate.    Limitations Lifting;Standing;Walking;House hold activities;Other (comment)    Patient Stated Goals to regain PLOF    Currently in Pain? Yes    Pain Score 7     Pain Location Back    Pain Orientation Lower    Pain Descriptors / Indicators Aching    Pain Type Neuropathic pain    Pain Onset More than a month ago  Patient went to ED for potential infection, had additional imaging since last seen due to lapse of ability to speak and walk which has resolved while in ER. No positive findings in imaging found. Will see neurologist tomorrow to discuss further.     Treatment: Supine with heat pad on low back  LE rotation 10x each LE low back pain relieving ; x 2 sets  Hamstring stretch on PT shoulder with increasing range of motion with time 60 seconds each LE Sciatic nerve glide with leg on PT shoulder 30 seconds each LE Piriformis stretch 60 seconds each LE Cross body internal rotation hip stretch with PT  overpressure 60 seconds each LE GTB around bilateral knees: abduction 20x GTB around bilateral knees; flexion with posterior pelvic tilt 12x each LE  Seated: Sit to stand with rollator in front of patient 10x from raised plinth table very challenging by end of set  Stand pivot transfer with Rollator and min A from PT to chair:  RTB around bilateral LE; LAQ 12x each LE   RTB hamstring curl 12x each LE RTB adduction 12x each LE  Large swiss ball rollouts 10x 10 second holds       Pt educated throughout session about proper posture and technique with exercises. Improved exercise technique, movement at target joints, use of target muscles after min to mod verbal, visual, tactile cues   Patient returning to therapy after going to ED for transient neurological symptoms. She is fatigued but highly motivated to perform strengthening and mobility. Was encouraged throughout session to continue focusing on what she can do at this time rather than worry about possibilities of the future with her disorder at this time until she meets with physician. Patient is able to tolerate all interventions well with increased tremors of RLE noted with fatigue. Patient will continue to benefit from skilled physical therapy to increase strength, functional mobility and activity tolerance for optimal functional independence in home and community and improved quality of life                     PT Education - 11/02/20 1630     Education Details exercise technique , body mechanics    Person(s) Educated Patient    Methods Explanation;Demonstration;Tactile cues;Verbal cues    Comprehension Verbalized understanding;Returned demonstration;Verbal cues required;Tactile cues required              PT Short Term Goals - 10/14/20 1739       PT SHORT TERM GOAL #1   Title Pt and caregivers will understand PT recommendation and appropriate/safe use for wheelchair and seating for home use.    Baseline  met    Time 1    Period Days    Status Achieved               PT Long Term Goals - 11/02/20 0001       PT LONG TERM GOAL #1   Title Pt. will be able to participate in regular ADL's with least restrictive device without pain increasing greater than 2/10    Baseline Pt. limited in her ability to perform household duties by increased pain and fatigue. As of 4/29: Her legs have more endurance with walking before she gets a tremor. Still needs to sit down the majority of the time she is cooking. Still needs back support for prolonged sitting. As of 6/10: Pt. able to do for  ~1 hour before she needs to rest to prevent increased pain,  pain is averaging ~ 4-5/10 3/14: able to perform ADLs with less pain, does still have bad days 4/25: requires stops throughout process more fatigue than pain limiting 6/2: vas of 7/10 in pelvis occasionally    Time 12    Period Weeks    Status On-going    Target Date 12/09/20      PT LONG TERM GOAL #2   Title Patient will increase 10 meter walk test to <30 seconds as to improve gait speed for better community ambulation and to reduce fall risk.    Baseline 6/2: 1 min 18 seconds with rollator    Time 12    Period Weeks    Status New    Target Date 12/09/20      PT LONG TERM GOAL #3   Title Patient (< 52 years old) will complete five times sit to stand test in < 20 seconds indicating an increased LE strength and improved balance.    Baseline 6/2: 43 seconds with heavy BUE support    Time 12    Period Weeks    Status New    Target Date 12/09/20      PT LONG TERM GOAL #4   Title Pt. will be able to perform 1 hr. of light housework reqiring intermittent standing and walking without increased pain/fatigue >2 pts.    Baseline Pt. becomes fatigued washing fruits/vegetbles in small stages and has fatigue>3 pts. 3/14: able to perform but needs rest breaks 4/25: able to do but is fatiguing 6/2: able to perform on good days; unable to perform on bad days    Time  12    Period Weeks    Status Partially Met    Target Date 12/09/20      PT LONG TERM GOAL #5   Title Pt. will be able to go 2-3 hours between emptying her bladder without increased pain and empty her bladder fully whether by urostomy, cath, or voluntary release.    Baseline Pt. has a urostomy but continues to have to self-catheterize. Has pain with bladder filling and when using catheter. As of 4/29: is doing better since starting to take the pregabalin andd baclofen but occasionally still has pain with bladder filling. It is bad when she has an infection or over-does activity, better on "normal" days which have been few and far between due to frequent UTI's. As of 6/10: Pt. has "good" and "bad" days but has more days where she can go 2-3 hours without increased pain. As of 04/15/20: able to wait 2 hours, not 3 yet 3/14: somedays able to meet this goals, some days is more frequent 4/25: good day: yes bad day every hour    Time 12    Period Weeks    Status Partially Met    Target Date 12/09/20      PT LONG TERM GOAL #6   Title Pt. will be able to attend a full athletic event for her children without Sx increasing by more than 2 pts.    Baseline Pt. having migraine or severe bladder spasms and fatigue and back pain> 3 pt. increase with attendance at athletic events 3/14: has increased pain but is able to attend now, has to use TENS unit 4/25: has not had any games to be able to attempt to go to 6/2: no games due to summer time    Time 12    Period Weeks    Status On-going    Target Date 12/09/20  Plan - 11/02/20 1632     Clinical Impression Statement Patient returning to therapy after going to ED for transient neurological symptoms. She is fatigued but highly motivated to perform strengthening and mobility. Was encouraged throughout session to continue focusing on what she can do at this time rather than worry about possibilities of the future with her disorder at  this time until she meets with physician. Patient is able to tolerate all interventions well with increased tremors of RLE noted with fatigue. Patient will continue to benefit from skilled physical therapy to increase strength, functional mobility and activity tolerance for optimal functional independence in home and community and improved quality of life    Personal Factors and Comorbidities Comorbidity 3+;Transportation;Time since onset of injury/illness/exacerbation;Finances    Comorbidities neurologic insult with pending diagnosis, multiple recurrent UTI's, BLE weakness, depression, hereditary spastic paraplegia    Examination-Activity Limitations Caring for Others;Carry;Continence;Squat;Stairs;Toileting;Locomotion Level;Lift;Stand;Bed Mobility;Bend;Reach Overhead;Sit;Dressing;Transfers    Examination-Participation Restrictions Church;Cleaning;Community Activity;Driving;Interpersonal Relationship;Laundry;[employer] Work;Meal Prep;School;Volunteer    Stability/Clinical Decision Making Unstable/Unpredictable    Rehab Potential Fair    PT Frequency 2x / week    PT Duration 12 weeks    PT Treatment/Interventions ADLs/Self Care Home Management;Aquatic Therapy;Biofeedback;Moist Heat;Electrical Stimulation;Cryotherapy;Traction;Ultrasound;Therapeutic activities;Functional mobility training;Stair training;Gait training;Therapeutic exercise;Balance training;Neuromuscular re-education;Patient/family education;Manual techniques;Dry needling;Scar mobilization;Energy conservation;Taping;Joint Manipulations;Spinal Manipulations;Visual/perceptual remediation/compensation;Passive range of motion;Wheelchair mobility training;DME Instruction;Orthotic Fit/Training    PT Next Visit Plan transfers, stabilization in standing, core activation    PT Home Exercise Plan no changes    Consulted and Agree with Plan of Care Patient             Patient will benefit from skilled therapeutic intervention in order to improve the  following deficits and impairments:  Abnormal gait, Decreased balance, Decreased endurance, Decreased mobility, Difficulty walking, Increased muscle spasms, Impaired sensation, Improper body mechanics, Impaired tone, Decreased activity tolerance, Decreased coordination, Decreased strength, Postural dysfunction, Pain, Impaired flexibility  Visit Diagnosis: Muscle weakness (generalized)  Other abnormalities of gait and mobility  Abnormality of gait and mobility     Problem List Patient Active Problem List   Diagnosis Date Noted   Seizure ([place]) 11/11/2018   Major depressive disorder, recurrent episode, moderate (HCC) 02/07/2018   Nephrolithiasis 04/16/2016   Numbness 07/28/2015   Bladder retention 06/23/2015   Abdominal pain 06/04/2015   Dizziness 05/18/2015   Neck pain 69/67/8938   Complicated migraine 01/31/5101   Other fatigue 04/28/2015   Abnormal finding on MRI of brain 04/28/2015   D (diarrhea) 03/29/2015   H/O disease 03/29/2015   Abnormal weight loss 03/29/2015   Muscle weakness (generalized) 03/14/2015   Headache, migraine 03/10/2015   Janna Arch, PT, DPT  11/02/2020, 4:34 PM  [place] MAIN [facility] SERVICES [address] [place], [place], [address] Phone: [phone]   Fax:  [phone]  Name: Christine Chavez MRN: [id number] Date of Birth: 10-Oct-1980

## 2020-11-03 ENCOUNTER — Ambulatory Visit: Payer: BC Managed Care – PPO

## 2020-11-08 ENCOUNTER — Ambulatory Visit: Payer: BC Managed Care – PPO

## 2020-11-08 ENCOUNTER — Other Ambulatory Visit: Payer: Self-pay

## 2020-11-08 ENCOUNTER — Ambulatory Visit: Payer: BC Managed Care – PPO | Admitting: Physical Therapy

## 2020-11-08 DIAGNOSIS — R262 Difficulty in walking, not elsewhere classified: Secondary | ICD-10-CM

## 2020-11-08 DIAGNOSIS — R2689 Other abnormalities of gait and mobility: Secondary | ICD-10-CM

## 2020-11-08 DIAGNOSIS — R269 Unspecified abnormalities of gait and mobility: Secondary | ICD-10-CM

## 2020-11-08 DIAGNOSIS — M62838 Other muscle spasm: Secondary | ICD-10-CM

## 2020-11-08 DIAGNOSIS — M6281 Muscle weakness (generalized): Secondary | ICD-10-CM | POA: Diagnosis not present

## 2020-11-08 NOTE — Therapy (Signed)
[place] MAIN [facility] SERVICES [address] [place], [place], [address] Phone: [phone]   Fax:  [phone]  Physical Therapy Treatment  Patient Details  Name: Christine Chavez MRN: [id number] Date of Birth: 07-05-1980 Referring Provider (PT): Elza Rafter   Encounter Date: 11/08/2020   PT End of Session - 11/08/20 1135     Visit Number 116    Number of Visits 131    Date for PT Re-Evaluation 12/09/20    Authorization Type 5/10 PN 6/20    Authorization Time Period 06/28/20-09/20/20    PT Start Time 1021    PT Stop Time 1105    PT Time Calculation (min) 44 min    Equipment Utilized During Treatment Gait belt    Activity Tolerance Patient tolerated treatment well;No increased pain    Behavior During Therapy WFL for tasks assessed/performed             Past Medical History:  Diagnosis Date   Complication of anesthesia    ? seizures after anesthesia    Headache    Migraines    Neurogenic bladder    Renal disorder    Vision abnormalities     Past Surgical History:  Procedure Laterality Date   ANTERIOR CRUCIATE LIGAMENT REPAIR  1997   APPENDECTOMY     COLONOSCOPY WITH PROPOFOL N/A 11/11/2018   Procedure: COLONOSCOPY WITH PROPOFOL;  Surgeon: [name], MD;  Location: [facility] ENDOSCOPY;  Service: Endoscopy;  Laterality: N/A;   CYSTOSCOPY WITH STENT PLACEMENT Right 04/17/2016   Procedure: CYSTOSCOPY WITH STENT PLACEMENT;  Surgeon: [name], MD;  Location: [facility] ORS;  Service: Urology;  Laterality: Right;   ESOPHAGOGASTRODUODENOSCOPY (EGD) WITH PROPOFOL N/A 11/11/2018   Procedure: ESOPHAGOGASTRODUODENOSCOPY (EGD) WITH PROPOFOL;  Surgeon: [name], MD;  Location: [facility] ENDOSCOPY;  Service: Endoscopy;  Laterality: N/A;   EXPLORATORY LAPAROTOMY  1999   KIDNEY STONE SURGERY  04/2016   REVISION UROSTOMY CUTANEOUS     REVISION UROSTOMY CUTANEOUS  01/10/2018   SUPRAPUBIC CATHETER PLACEMENT  08/2017    TONSILLECTOMY      There were no vitals filed for this visit.   Subjective Assessment - 11/08/20 1130     Subjective Patient states she is alright today, reporting yesterday was a rough day and she stayed in bed most of the afternoon. She does have an appointment with her urologist tomorrow where she hopes to recieve some results. She does report pain in her bladder that radiates into her low back.    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 Due to the rarity of her disorder it took years prior to diagnosis. 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  Patient has good days and bad days. Her good days allow for short ambulation (within room only not between rooms) and her bad days she is unable to ambulate.    Limitations Lifting;Standing;Walking;House hold activities;Other (comment)    Patient Stated Goals to regain PLOF    Currently in Pain? Yes    Pain Location Bladder    Pain Onset More than a month ago             Treatment  Supine with heat pad on low back: LE rotation 10x each LE low  back pain relieving ; x 2 sets; Hamstring stretch on PT shoulder, increasing range of motion with time 2x60 seconds each LE; Sciatic nerve glide with leg on PT shoulder 60 seconds each LE; Piriformis stretch 60 seconds each LE;   Seated: Sit to stand with standard walker in front of patient 10x from raised plinth table; challenging by end of set RTB hamstring curl 10x each LE; RTB bilateral abduction 10x; Large swiss ball rollouts 10x 10 second holds, OP by PT provided;  Standing marches, alternating using standard walker x10 total; Standing calf raise x8, difficulty extending legs due to BLE  spasticity;   Stand pivot transfer with HHA and min A chair<>EOM.   Pt educated throughout session about proper posture and technique with exercises. Improved exercise technique, movement at target joints, use of target muscles after min to mod verbal, visual, tactile cues     Patient arrives to therapy with excellent motivation. Due to fatigue and increased pain in bladder, most exercises were performed in supine and sitting. All exercises completed in a slow and controlled manner. She did perform 3 standing exercises with increased tremors (RLE greater than LLE) and spasticity during calf raises. She was able to tolerate all interventions well with fatigue reported at the end of the session. Patient will continue to benefit from skilled physical therapy to increase strength, functional mobility and activity tolerance for optimal functional independence in home and community and improved quality of life.          PT Short Term Goals - 10/14/20 1739       PT SHORT TERM GOAL #1   Title Pt and caregivers will understand PT recommendation and appropriate/safe use for wheelchair and seating for home use.    Baseline met    Time 1    Period Days    Status Achieved               PT Long Term Goals - 11/02/20 0001       PT LONG TERM GOAL #1   Title Pt. will be able to participate in regular ADL's with least restrictive device without pain increasing greater than 2/10    Baseline Pt. limited in her ability to perform household duties by increased pain and fatigue. As of 4/29: Her legs have more endurance with walking before she gets a tremor. Still needs to sit down the majority of the time she is cooking. Still needs back support for prolonged sitting. As of 6/10: Pt. able to do for  ~1 hour before she needs to rest to prevent increased pain, pain is averaging ~ 4-5/10 3/14: able to perform ADLs with less pain, does still have bad days 4/25: requires stops throughout process more  fatigue than pain limiting 6/2: vas of 7/10 in pelvis occasionally    Time 12    Period Weeks    Status On-going    Target Date 12/09/20      PT LONG TERM GOAL #2   Title Patient will increase 10 meter walk test to <30 seconds as to improve gait speed for better community ambulation and to reduce fall risk.    Baseline 6/2: 1 min 18 seconds with rollator    Time 12    Period Weeks    Status New    Target Date 12/09/20      PT LONG TERM GOAL #3   Title Patient (< 6 years old) will complete five times sit to stand test in < 20 seconds  indicating an increased LE strength and improved balance.    Baseline 6/2: 43 seconds with heavy BUE support    Time 12    Period Weeks    Status New    Target Date 12/09/20      PT LONG TERM GOAL #4   Title Pt. will be able to perform 1 hr. of light housework reqiring intermittent standing and walking without increased pain/fatigue >2 pts.    Baseline Pt. becomes fatigued washing fruits/vegetbles in small stages and has fatigue>3 pts. 3/14: able to perform but needs rest breaks 4/25: able to do but is fatiguing 6/2: able to perform on good days; unable to perform on bad days    Time 12    Period Weeks    Status Partially Met    Target Date 12/09/20      PT LONG TERM GOAL #5   Title Pt. will be able to go 2-3 hours between emptying her bladder without increased pain and empty her bladder fully whether by urostomy, cath, or voluntary release.    Baseline Pt. has a urostomy but continues to have to self-catheterize. Has pain with bladder filling and when using catheter. As of 4/29: is doing better since starting to take the pregabalin andd baclofen but occasionally still has pain with bladder filling. It is bad when she has an infection or over-does activity, better on "normal" days which have been few and far between due to frequent UTI's. As of 6/10: Pt. has "good" and "bad" days but has more days where she can go 2-3 hours without increased pain. As of  04/15/20: able to wait 2 hours, not 3 yet 3/14: somedays able to meet this goals, some days is more frequent 4/25: good day: yes bad day every hour    Time 12    Period Weeks    Status Partially Met    Target Date 12/09/20      PT LONG TERM GOAL #6   Title Pt. will be able to attend a full athletic event for her children without Sx increasing by more than 2 pts.    Baseline Pt. having migraine or severe bladder spasms and fatigue and back pain> 3 pt. increase with attendance at athletic events 3/14: has increased pain but is able to attend now, has to use TENS unit 4/25: has not had any games to be able to attempt to go to 6/2: no games due to summer time    Time 12    Period Weeks    Status On-going    Target Date 12/09/20                   Plan - 11/08/20 1136     Clinical Impression Statement Patient arrives to therapy with excellent motivation. Due to fatigue and increased pain in bladder, most exercises were performed in supine and sitting. All exercises completed in a slow and controlled manner. She did perform 3 standing exercises with increased tremors (RLE greater than LLE) and spasticity during calf raises. She was able to tolerate all interventions well with fatigue reported at the end of the session. Patient will continue to benefit from skilled physical therapy to increase strength, functional mobility and activity tolerance for optimal functional independence in home and community and improved quality of life.    Personal Factors and Comorbidities Comorbidity 3+;Transportation;Time since onset of injury/illness/exacerbation;Finances    Comorbidities neurologic insult with pending diagnosis, multiple recurrent UTI's, BLE weakness, depression, hereditary spastic paraplegia  Examination-Activity Limitations Caring for Others;Carry;Continence;Squat;Stairs;Toileting;Locomotion Level;Lift;Stand;Bed Mobility;Bend;Reach Overhead;Sit;Dressing;Transfers     Examination-Participation Restrictions Church;Cleaning;Community Activity;Driving;Interpersonal Relationship;Laundry;[employer] Work;Meal Prep;School;Volunteer    Stability/Clinical Decision Making Unstable/Unpredictable    Rehab Potential Fair    PT Frequency 2x / week    PT Duration 12 weeks    PT Treatment/Interventions ADLs/Self Care Home Management;Aquatic Therapy;Biofeedback;Moist Heat;Electrical Stimulation;Cryotherapy;Traction;Ultrasound;Therapeutic activities;Functional mobility training;Stair training;Gait training;Therapeutic exercise;Balance training;Neuromuscular re-education;Patient/family education;Manual techniques;Dry needling;Scar mobilization;Energy conservation;Taping;Joint Manipulations;Spinal Manipulations;Visual/perceptual remediation/compensation;Passive range of motion;Wheelchair mobility training;DME Instruction;Orthotic Fit/Training    PT Next Visit Plan transfers, stabilization in standing, core activation    PT Home Exercise Plan no changes    Consulted and Agree with Plan of Care Patient             Patient will benefit from skilled therapeutic intervention in order to improve the following deficits and impairments:  Abnormal gait, Decreased balance, Decreased endurance, Decreased mobility, Difficulty walking, Increased muscle spasms, Impaired sensation, Improper body mechanics, Impaired tone, Decreased activity tolerance, Decreased coordination, Decreased strength, Postural dysfunction, Pain, Impaired flexibility  Visit Diagnosis: Muscle weakness (generalized)  Abnormality of gait and mobility  Other abnormalities of gait and mobility  Difficulty in walking, not elsewhere classified  Other muscle spasm     Problem List Patient Active Problem List   Diagnosis Date Noted   Seizure ([place]) 11/11/2018   Major depressive disorder, recurrent episode, moderate ([place]) 02/07/2018   Nephrolithiasis 04/16/2016   Numbness 07/28/2015   Bladder retention 06/23/2015    Abdominal pain 06/04/2015   Dizziness 05/18/2015   Neck pain 00/37/0488   Complicated migraine 89/16/9450   Other fatigue 04/28/2015   Abnormal finding on MRI of brain 04/28/2015   D (diarrhea) 03/29/2015   H/O disease 03/29/2015   Abnormal weight loss 03/29/2015   Muscle weakness (generalized) 03/14/2015   Headache, migraine 03/10/2015   Patrina Levering PT, DPT  [facility] 11/08/2020, 11:38 AM  [place] MAIN [facility] SERVICES [address] [place], [place], [address] Phone: [phone]   Fax:  [phone]  Name: Christine Chavez MRN: [id number] Date of Birth: [birth year]

## 2020-11-09 ENCOUNTER — Ambulatory Visit: Payer: BC Managed Care – PPO

## 2020-11-10 ENCOUNTER — Ambulatory Visit: Payer: BC Managed Care – PPO

## 2020-11-10 ENCOUNTER — Other Ambulatory Visit: Payer: Self-pay

## 2020-11-10 DIAGNOSIS — R2689 Other abnormalities of gait and mobility: Secondary | ICD-10-CM

## 2020-11-10 DIAGNOSIS — M6281 Muscle weakness (generalized): Secondary | ICD-10-CM

## 2020-11-10 DIAGNOSIS — R269 Unspecified abnormalities of gait and mobility: Secondary | ICD-10-CM

## 2020-11-10 NOTE — Therapy (Signed)
[place] MAIN [facility] SERVICES [address] [place], [place], [address] Phone: [phone]   Fax:  [phone]  Physical Therapy Treatment  Patient Details  Name: Christine Chavez MRN: [id number] Date of Birth: [birth year] Referring Provider (PT): Elza Rafter   Encounter Date: 11/10/2020   PT End of Session - 11/10/20 1244     Visit Number [id number]    Number of Visits 131    Date for PT Re-Evaluation 12/09/20    Authorization Type 7/10 PN 6/20    Authorization Time Period 06/28/20-09/20/20    PT Start Time 1114    PT Stop Time 1200    PT Time Calculation (min) 46 min    Equipment Utilized During Treatment Gait belt    Activity Tolerance Patient tolerated treatment well;No increased pain    Behavior During Therapy WFL for tasks assessed/performed             Past Medical History:  Diagnosis Date   Complication of anesthesia    ? seizures after anesthesia    Headache    Migraines    Neurogenic bladder    Renal disorder    Vision abnormalities     Past Surgical History:  Procedure Laterality Date   ANTERIOR CRUCIATE LIGAMENT REPAIR  1997   APPENDECTOMY     COLONOSCOPY WITH PROPOFOL N/A 11/11/2018   Procedure: COLONOSCOPY WITH PROPOFOL;  Surgeon: [name], MD;  Location: [facility] ENDOSCOPY;  Service: Endoscopy;  Laterality: N/A;   CYSTOSCOPY WITH STENT PLACEMENT Right 04/17/2016   Procedure: CYSTOSCOPY WITH STENT PLACEMENT;  Surgeon: [name], MD;  Location: [facility] ORS;  Service: Urology;  Laterality: Right;   ESOPHAGOGASTRODUODENOSCOPY (EGD) WITH PROPOFOL N/A 11/11/2018   Procedure: ESOPHAGOGASTRODUODENOSCOPY (EGD) WITH PROPOFOL;  Surgeon: [name], MD;  Location: [facility] ENDOSCOPY;  Service: Endoscopy;  Laterality: N/A;   EXPLORATORY LAPAROTOMY  1999   KIDNEY STONE SURGERY  04/2016   REVISION UROSTOMY CUTANEOUS     REVISION UROSTOMY CUTANEOUS  01/10/2018   SUPRAPUBIC CATHETER PLACEMENT  08/2017    TONSILLECTOMY      There were no vitals filed for this visit.   Subjective Assessment - 11/10/20 1242     Subjective Patient reports new onset of low back pain that radiates to L buttocks. Was unable to move much yesterday due to the pain is a little better today but still limiting. Will be out of town next week for a family vacation.    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 Due to the rarity of her disorder it took years prior to diagnosis. 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  Patient has good days and bad days. Her good days allow for short ambulation (within room only not between rooms) and her bad days she is unable to ambulate.    Limitations Lifting;Standing;Walking;House hold activities;Other (comment)    Patient Stated Goals to regain PLOF    Currently in Pain? Yes    Pain Score 8     Pain Location Back    Pain Orientation Lower    Pain Descriptors / Indicators Aching    Pain Type Neuropathic pain    Pain Onset More than a month ago    Pain Frequency Intermittent  TherEx Single knee to chest hold 30 seconds each LE Figure four piriformis stretch 30 seconds each LE  Sidelying: clamshells 12x LLE Sidelying: hip extension 8x: terminated at 8 due to low back pain  Seated: large ball swiss rollout for pain reduction 10x 10 second holds   Manual: Supine:  Hamstring lengthening 60 seconds each LE with progressive range with prolonged hold.  LE rotation 60 seconds x 3 trials for low back pain reduction Distraction with belt 6x20 seconds Distraction with figure four position with belt 6x 20 second holds Sciatic nerve glide with leg on PT shoulder  30 seconds each LE  Sidelying:  STM with implementation of effleurage and ptrissage to left lumbar paraspinals and quadratus for pain reduction x 8 minutes  L pelvic distraction 2x 30 seconds  Stand pivot transfer with Rollator and min A from PT to table  Pt educated throughout session about proper posture and technique with exercises. Improved exercise technique, movement at target joints, use of target muscles after min to mod verbal, visual, tactile cues  Patient presents with pain in left lower back radiating to glute. Extensive soft tissue and muscle tissue lengthening interventions helped reduce pain to more manageable levels by end of session. Patient educated on need for stretching while always on trip to reduce pain and prevent flare ups. at the end of the session. Patient will continue to benefit from skilled physical therapy to increase strength, functional mobility and activity tolerance for optimal functional independence in home and community and improved quality of life.              PT Education - 11/10/20 1243     Education Details manual, pain reduction    Person(s) Educated Patient    Methods Explanation;Demonstration;Tactile cues;Verbal cues    Comprehension Verbalized understanding;Returned demonstration;Verbal cues required;Tactile cues required              PT Short Term Goals - 10/14/20 1739       PT SHORT TERM GOAL #1   Title Pt and caregivers will understand PT recommendation and appropriate/safe use for wheelchair and seating for home use.    Baseline met    Time 1    Period Days    Status Achieved               PT Long Term Goals - 11/02/20 0001       PT LONG TERM GOAL #1   Title Pt. will be able to participate in regular ADL's with least restrictive device without pain increasing greater than 2/10    Baseline Pt. limited in her ability to perform household duties by increased pain and fatigue. As of 4/29: Her legs have more  endurance with walking before she gets a tremor. Still needs to sit down the majority of the time she is cooking. Still needs back support for prolonged sitting. As of 6/10: Pt. able to do for  ~1 hour before she needs to rest to prevent increased pain, pain is averaging ~ 4-5/10 3/14: able to perform ADLs with less pain, does still have bad days 4/25: requires stops throughout process more fatigue than pain limiting 6/2: vas of 7/10 in pelvis occasionally    Time 12    Period Weeks    Status On-going    Target Date 12/09/20      PT LONG TERM GOAL #2   Title Patient will increase 10 meter walk test to <30 seconds as to improve gait speed for better  community ambulation and to reduce fall risk.    Baseline 6/2: 1 min 18 seconds with rollator    Time 12    Period Weeks    Status New    Target Date 12/09/20      PT LONG TERM GOAL #3   Title Patient (< 47 years old) will complete five times sit to stand test in < 20 seconds indicating an increased LE strength and improved balance.    Baseline 6/2: 43 seconds with heavy BUE support    Time 12    Period Weeks    Status New    Target Date 12/09/20      PT LONG TERM GOAL #4   Title Pt. will be able to perform 1 hr. of light housework reqiring intermittent standing and walking without increased pain/fatigue >2 pts.    Baseline Pt. becomes fatigued washing fruits/vegetbles in small stages and has fatigue>3 pts. 3/14: able to perform but needs rest breaks 4/25: able to do but is fatiguing 6/2: able to perform on good days; unable to perform on bad days    Time 12    Period Weeks    Status Partially Met    Target Date 12/09/20      PT LONG TERM GOAL #5   Title Pt. will be able to go 2-3 hours between emptying her bladder without increased pain and empty her bladder fully whether by urostomy, cath, or voluntary release.    Baseline Pt. has a urostomy but continues to have to self-catheterize. Has pain with bladder filling and when using catheter.  As of 4/29: is doing better since starting to take the pregabalin andd baclofen but occasionally still has pain with bladder filling. It is bad when she has an infection or over-does activity, better on "normal" days which have been few and far between due to frequent UTI's. As of 6/10: Pt. has "good" and "bad" days but has more days where she can go 2-3 hours without increased pain. As of 04/15/20: able to wait 2 hours, not 3 yet 3/14: somedays able to meet this goals, some days is more frequent 4/25: good day: yes bad day every hour    Time 12    Period Weeks    Status Partially Met    Target Date 12/09/20      PT LONG TERM GOAL #6   Title Pt. will be able to attend a full athletic event for her children without Sx increasing by more than 2 pts.    Baseline Pt. having migraine or severe bladder spasms and fatigue and back pain> 3 pt. increase with attendance at athletic events 3/14: has increased pain but is able to attend now, has to use TENS unit 4/25: has not had any games to be able to attempt to go to 6/2: no games due to summer time    Time 12    Period Weeks    Status On-going    Target Date 12/09/20                   Plan - 11/10/20 1251     Clinical Impression Statement Patient presents with pain in left lower back radiating to glute. Extensive soft tissue and muscle tissue lengthening interventions helped reduce pain to more manageable levels by end of session. Patient educated on need for stretching while always on trip to reduce pain and prevent flare ups. at the end of the session. Patient will continue to benefit from skilled  physical therapy to increase strength, functional mobility and activity tolerance for optimal functional independence in home and community and improved quality of life.    Personal Factors and Comorbidities Comorbidity 3+;Transportation;Time since onset of injury/illness/exacerbation;Finances    Comorbidities neurologic insult with pending  diagnosis, multiple recurrent UTI's, BLE weakness, depression, hereditary spastic paraplegia    Examination-Activity Limitations Caring for Others;Carry;Continence;Squat;Stairs;Toileting;Locomotion Level;Lift;Stand;Bed Mobility;Bend;Reach Overhead;Sit;Dressing;Transfers    Examination-Participation Restrictions Church;Cleaning;Community Activity;Driving;Interpersonal Relationship;Laundry;[employer] Work;Meal Prep;School;Volunteer    Stability/Clinical Decision Making Unstable/Unpredictable    Rehab Potential Fair    PT Frequency 2x / week    PT Duration 12 weeks    PT Treatment/Interventions ADLs/Self Care Home Management;Aquatic Therapy;Biofeedback;Moist Heat;Electrical Stimulation;Cryotherapy;Traction;Ultrasound;Therapeutic activities;Functional mobility training;Stair training;Gait training;Therapeutic exercise;Balance training;Neuromuscular re-education;Patient/family education;Manual techniques;Dry needling;Scar mobilization;Energy conservation;Taping;Joint Manipulations;Spinal Manipulations;Visual/perceptual remediation/compensation;Passive range of motion;Wheelchair mobility training;DME Instruction;Orthotic Fit/Training    PT Next Visit Plan transfers, stabilization in standing, core activation    PT Home Exercise Plan no changes    Consulted and Agree with Plan of Care Patient             Patient will benefit from skilled therapeutic intervention in order to improve the following deficits and impairments:  Abnormal gait, Decreased balance, Decreased endurance, Decreased mobility, Difficulty walking, Increased muscle spasms, Impaired sensation, Improper body mechanics, Impaired tone, Decreased activity tolerance, Decreased coordination, Decreased strength, Postural dysfunction, Pain, Impaired flexibility  Visit Diagnosis: Muscle weakness (generalized)  Abnormality of gait and mobility  Other abnormalities of gait and mobility     Problem List Patient Active Problem List   Diagnosis  Date Noted   Seizure ([place]) 11/11/2018   Major depressive disorder, recurrent episode, moderate (HCC) 02/07/2018   Nephrolithiasis 04/16/2016   Numbness 07/28/2015   Bladder retention 06/23/2015   Abdominal pain 06/04/2015   Dizziness 05/18/2015   Neck pain 43/32/9518   Complicated migraine 84/16/6063   Other fatigue 04/28/2015   Abnormal finding on MRI of brain 04/28/2015   D (diarrhea) 03/29/2015   H/O disease 03/29/2015   Abnormal weight loss 03/29/2015   Muscle weakness (generalized) 03/14/2015   Headache, migraine 03/10/2015    Janna Arch, PT, DPT  11/10/2020, 12:53 PM  [place] MAIN [facility] SERVICES [address] [place], [place], [address] Phone: [phone]   Fax:  [phone]  Name: Christine Chavez MRN: [id number] Date of Birth: 1980-08-01

## 2020-11-15 ENCOUNTER — Ambulatory Visit: Payer: BC Managed Care – PPO

## 2020-11-18 ENCOUNTER — Ambulatory Visit: Payer: BC Managed Care – PPO

## 2020-11-22 ENCOUNTER — Ambulatory Visit: Payer: BC Managed Care – PPO

## 2020-11-25 ENCOUNTER — Ambulatory Visit: Payer: BC Managed Care – PPO

## 2020-11-29 ENCOUNTER — Ambulatory Visit: Payer: BC Managed Care – PPO | Attending: Gastroenterology

## 2020-11-29 ENCOUNTER — Other Ambulatory Visit: Payer: Self-pay

## 2020-11-29 DIAGNOSIS — R269 Unspecified abnormalities of gait and mobility: Secondary | ICD-10-CM | POA: Insufficient documentation

## 2020-11-29 DIAGNOSIS — R2689 Other abnormalities of gait and mobility: Secondary | ICD-10-CM | POA: Diagnosis present

## 2020-11-29 DIAGNOSIS — M6281 Muscle weakness (generalized): Secondary | ICD-10-CM | POA: Insufficient documentation

## 2020-11-29 NOTE — Therapy (Signed)
[place] MAIN [facility] SERVICES [address] [place], [place], [address] Phone: [phone]   Fax:  [phone]  Physical Therapy Treatment  Patient Details  Name: Christine Chavez MRN: [id number] Date of Birth: 03/05/1981 Referring Provider (PT): Elza Rafter   Encounter Date: 11/29/2020   PT End of Session - 11/29/20 1248     Visit Number [id number]    Number of Visits 131    Date for PT Re-Evaluation 12/09/20    Authorization Type 8/10 PN 6/20    Authorization Time Period 06/28/20-09/20/20    PT Start Time 1015    PT Stop Time 1059    PT Time Calculation (min) 44 min    Equipment Utilized During Treatment Gait belt    Activity Tolerance Patient tolerated treatment well;No increased pain    Behavior During Therapy WFL for tasks assessed/performed             Past Medical History:  Diagnosis Date   Complication of anesthesia    ? seizures after anesthesia    Headache    Migraines    Neurogenic bladder    Renal disorder    Vision abnormalities     Past Surgical History:  Procedure Laterality Date   ANTERIOR CRUCIATE LIGAMENT REPAIR  1997   APPENDECTOMY     COLONOSCOPY WITH PROPOFOL N/A 11/11/2018   Procedure: COLONOSCOPY WITH PROPOFOL;  Surgeon: [name], MD;  Location: [facility] ENDOSCOPY;  Service: Endoscopy;  Laterality: N/A;   CYSTOSCOPY WITH STENT PLACEMENT Right 04/17/2016   Procedure: CYSTOSCOPY WITH STENT PLACEMENT;  Surgeon: [name], MD;  Location: [facility] ORS;  Service: Urology;  Laterality: Right;   ESOPHAGOGASTRODUODENOSCOPY (EGD) WITH PROPOFOL N/A 11/11/2018   Procedure: ESOPHAGOGASTRODUODENOSCOPY (EGD) WITH PROPOFOL;  Surgeon: [name], MD;  Location: [facility] ENDOSCOPY;  Service: Endoscopy;  Laterality: N/A;   EXPLORATORY LAPAROTOMY  1999   KIDNEY STONE SURGERY  04/2016   REVISION UROSTOMY CUTANEOUS     REVISION UROSTOMY CUTANEOUS  01/10/2018   SUPRAPUBIC CATHETER PLACEMENT  08/2017    TONSILLECTOMY      There were no vitals filed for this visit.   Subjective Assessment - 11/29/20 1020     Subjective Patient has been absent form therapy for two weeks. Was on vacation for a week then her ride cancelled for a week. During this time she was diagnosed with a UTI. Had back pain/kidney pain that limited her ability to sleep last night.    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 Due to the rarity of her disorder it took years prior to diagnosis. 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  Patient has good days and bad days. Her good days allow for short ambulation (within room only not between rooms) and her bad days she is unable to ambulate.    Limitations Lifting;Standing;Walking;House hold activities;Other (comment)    Patient Stated Goals to regain PLOF    Currently in Pain? Yes    Pain Score 6     Pain Location Back    Pain Orientation Lower;Medial    Pain Descriptors / Indicators Aching    Pain Type Neuropathic pain    Pain Onset More than a month ago  Patient has been absent form therapy for two weeks. Was on vacation for a week then her ride cancelled for a week. During this time she was diagnosed with a UTI. Had back pain/kidney pain that limited her ability to sleep last night.         TherEx Supine: GTB around bilateral knees: abduction 15x with focus on slow controlled movements.  GTB around bilateral knees; march with core contraction 12x each side   Sidelying : Sidelying: clamshells 12x LLE  Seated: heat pad at back RTB hamstring curls 12x each LE RTB on floor, floor taps with contralateral tapping for coordination, spatial  awareness, and muscle recruitment x 15 each LE Alternating IR/ER tapping theraband on floor for target x 15 each side   Manual: Supine:  Hamstring lengthening 60 seconds each LE with progressive range with prolonged hold.  Piriformis stretch 60 seconds each LE Single knee to chest 60 seconds each LE  LE rotation 60 seconds x 3 trials for low back pain reduction Sciatic nerve glide with leg on PT shoulder 30 seconds each LE   Stand pivot transfer with min A from PT to table   Pt educated throughout session about proper posture and technique with exercises. Improved exercise technique, movement at target joints, use of target muscles after min to mod verbal, visual, tactile cues   Patient presents with increase back pain and fatigue from recent UTI. She is highly motivated despite recent illness and personal challenges. Education on stress management for reduction of symptoms performed with patient verbalizing understanding. Strengthening and pain reduction interventions tolerated well with patient reporting feeling better by end of session. Patient will continue to benefit from skilled physical therapy to increase strength, functional mobility and activity tolerance for optimal functional independence in home and community and improved quality of life.              PT Education - 11/29/20 1247     Education Details exercise technique, pain reduction,    Person(s) Educated Patient    Methods Explanation;Demonstration;Tactile cues;Verbal cues    Comprehension Verbal cues required;Returned demonstration;Verbalized understanding;Tactile cues required              PT Short Term Goals - 10/14/20 1739       PT SHORT TERM GOAL #1   Title Pt and caregivers will understand PT recommendation and appropriate/safe use for wheelchair and seating for home use.    Baseline met    Time 1    Period Days    Status Achieved               PT Long Term Goals - 11/02/20 0001        PT LONG TERM GOAL #1   Title Pt. will be able to participate in regular ADL's with least restrictive device without pain increasing greater than 2/10    Baseline Pt. limited in her ability to perform household duties by increased pain and fatigue. As of 4/29: Her legs have more endurance with walking before she gets a tremor. Still needs to sit down the majority of the time she is cooking. Still needs back support for prolonged sitting. As of 6/10: Pt. able to do for  ~1 hour before she needs to rest to prevent increased pain, pain is averaging ~ 4-5/10 3/14: able to perform ADLs with less pain, does still have bad days 4/25: requires stops throughout process more fatigue than pain limiting 6/2: vas of 7/10 in pelvis occasionally  Time 12    Period Weeks    Status On-going    Target Date 12/09/20      PT LONG TERM GOAL #2   Title Patient will increase 10 meter walk test to <30 seconds as to improve gait speed for better community ambulation and to reduce fall risk.    Baseline 6/2: 1 min 18 seconds with rollator    Time 12    Period Weeks    Status New    Target Date 12/09/20      PT LONG TERM GOAL #3   Title Patient (< 10 years old) will complete five times sit to stand test in < 20 seconds indicating an increased LE strength and improved balance.    Baseline 6/2: 43 seconds with heavy BUE support    Time 12    Period Weeks    Status New    Target Date 12/09/20      PT LONG TERM GOAL #4   Title Pt. will be able to perform 1 hr. of light housework reqiring intermittent standing and walking without increased pain/fatigue >2 pts.    Baseline Pt. becomes fatigued washing fruits/vegetbles in small stages and has fatigue>3 pts. 3/14: able to perform but needs rest breaks 4/25: able to do but is fatiguing 6/2: able to perform on good days; unable to perform on bad days    Time 12    Period Weeks    Status Partially Met    Target Date 12/09/20      PT LONG TERM GOAL #5   Title Pt. will  be able to go 2-3 hours between emptying her bladder without increased pain and empty her bladder fully whether by urostomy, cath, or voluntary release.    Baseline Pt. has a urostomy but continues to have to self-catheterize. Has pain with bladder filling and when using catheter. As of 4/29: is doing better since starting to take the pregabalin andd baclofen but occasionally still has pain with bladder filling. It is bad when she has an infection or over-does activity, better on "normal" days which have been few and far between due to frequent UTI's. As of 6/10: Pt. has "good" and "bad" days but has more days where she can go 2-3 hours without increased pain. As of 04/15/20: able to wait 2 hours, not 3 yet 3/14: somedays able to meet this goals, some days is more frequent 4/25: good day: yes bad day every hour    Time 12    Period Weeks    Status Partially Met    Target Date 12/09/20      PT LONG TERM GOAL #6   Title Pt. will be able to attend a full athletic event for her children without Sx increasing by more than 2 pts.    Baseline Pt. having migraine or severe bladder spasms and fatigue and back pain> 3 pt. increase with attendance at athletic events 3/14: has increased pain but is able to attend now, has to use TENS unit 4/25: has not had any games to be able to attempt to go to 6/2: no games due to summer time    Time 12    Period Weeks    Status On-going    Target Date 12/09/20                   Plan - 11/29/20 1257     Clinical Impression Statement Patient presents with increase back pain and fatigue from recent UTI. She  is highly motivated despite recent illness and personal challenges. Education on stress management for reduction of symptoms performed with patient verbalizing understanding. Strengthening and pain reduction interventions tolerated well with patient reporting feeling better by end of session. Patient will continue to benefit from skilled physical therapy to  increase strength, functional mobility and activity tolerance for optimal functional independence in home and community and improved quality of life.    Personal Factors and Comorbidities Comorbidity 3+;Transportation;Time since onset of injury/illness/exacerbation;Finances    Comorbidities neurologic insult with pending diagnosis, multiple recurrent UTI's, BLE weakness, depression, hereditary spastic paraplegia    Examination-Activity Limitations Caring for Others;Carry;Continence;Squat;Stairs;Toileting;Locomotion Level;Lift;Stand;Bed Mobility;Bend;Reach Overhead;Sit;Dressing;Transfers    Examination-Participation Restrictions Church;Cleaning;Community Activity;Driving;Interpersonal Relationship;Laundry;[employer] Work;Meal Prep;School;Volunteer    Stability/Clinical Decision Making Unstable/Unpredictable    Rehab Potential Fair    PT Frequency 2x / week    PT Duration 12 weeks    PT Treatment/Interventions ADLs/Self Care Home Management;Aquatic Therapy;Biofeedback;Moist Heat;Electrical Stimulation;Cryotherapy;Traction;Ultrasound;Therapeutic activities;Functional mobility training;Stair training;Gait training;Therapeutic exercise;Balance training;Neuromuscular re-education;Patient/family education;Manual techniques;Dry needling;Scar mobilization;Energy conservation;Taping;Joint Manipulations;Spinal Manipulations;Visual/perceptual remediation/compensation;Passive range of motion;Wheelchair mobility training;DME Instruction;Orthotic Fit/Training    PT Next Visit Plan transfers, stabilization in standing, core activation    PT Home Exercise Plan no changes    Consulted and Agree with Plan of Care Patient             Patient will benefit from skilled therapeutic intervention in order to improve the following deficits and impairments:  Abnormal gait, Decreased balance, Decreased endurance, Decreased mobility, Difficulty walking, Increased muscle spasms, Impaired sensation, Improper body mechanics, Impaired  tone, Decreased activity tolerance, Decreased coordination, Decreased strength, Postural dysfunction, Pain, Impaired flexibility  Visit Diagnosis: Muscle weakness (generalized)  Abnormality of gait and mobility  Other abnormalities of gait and mobility     Problem List Patient Active Problem List   Diagnosis Date Noted   Seizure ([place]) 11/11/2018   Major depressive disorder, recurrent episode, moderate ([facility]) 02/07/2018   Nephrolithiasis 04/16/2016   Numbness 07/28/2015   Bladder retention 06/23/2015   Abdominal pain 06/04/2015   Dizziness 05/18/2015   Neck pain 16/01/9603   Complicated migraine 54/12/8117   Other fatigue 04/28/2015   Abnormal finding on MRI of brain 04/28/2015   D (diarrhea) 03/29/2015   H/O disease 03/29/2015   Abnormal weight loss 03/29/2015   Muscle weakness (generalized) 03/14/2015   Headache, migraine 03/10/2015   Janna Arch, PT, DPT  11/29/2020, 12:59 PM  [place] MAIN [facility] SERVICES [address] [place], [place], [address] Phone: [phone]   Fax:  [phone]  Name: Kihanna Kamiya MRN: [id number] Date of Birth: 03-15-1981

## 2020-11-30 ENCOUNTER — Ambulatory Visit: Payer: BC Managed Care – PPO

## 2020-12-02 ENCOUNTER — Other Ambulatory Visit: Payer: Self-pay

## 2020-12-02 ENCOUNTER — Ambulatory Visit: Payer: BC Managed Care – PPO

## 2020-12-02 DIAGNOSIS — R2689 Other abnormalities of gait and mobility: Secondary | ICD-10-CM

## 2020-12-02 DIAGNOSIS — R269 Unspecified abnormalities of gait and mobility: Secondary | ICD-10-CM

## 2020-12-02 DIAGNOSIS — M6281 Muscle weakness (generalized): Secondary | ICD-10-CM

## 2020-12-02 NOTE — Therapy (Signed)
[place] MAIN [facility] SERVICES [address] [place], [place], [address] Phone: [phone]   Fax:  [phone]  Physical Therapy Treatment  Patient Details  Name: Christine Chavez MRN: [id number] Date of Birth: 06/20/1980 Referring Provider (PT): Elza Rafter   Encounter Date: 12/02/2020   PT End of Session - 12/02/20 1229     Visit Number [id number]    Number of Visits 131    Date for PT Re-Evaluation 12/09/20    Authorization Type 9/10 PN 6/20    Authorization Time Period 06/28/20-09/20/20    PT Start Time 1104    PT Stop Time 1147    PT Time Calculation (min) 43 min    Equipment Utilized During Treatment Gait belt    Activity Tolerance Patient tolerated treatment well;No increased pain    Behavior During Therapy WFL for tasks assessed/performed             Past Medical History:  Diagnosis Date   Complication of anesthesia    ? seizures after anesthesia    Headache    Migraines    Neurogenic bladder    Renal disorder    Vision abnormalities     Past Surgical History:  Procedure Laterality Date   ANTERIOR CRUCIATE LIGAMENT REPAIR  1997   APPENDECTOMY     COLONOSCOPY WITH PROPOFOL N/A 11/11/2018   Procedure: COLONOSCOPY WITH PROPOFOL;  Surgeon: [name], MD;  Location: [facility] ENDOSCOPY;  Service: Endoscopy;  Laterality: N/A;   CYSTOSCOPY WITH STENT PLACEMENT Right 04/17/2016   Procedure: CYSTOSCOPY WITH STENT PLACEMENT;  Surgeon: [name], MD;  Location: [facility] ORS;  Service: Urology;  Laterality: Right;   ESOPHAGOGASTRODUODENOSCOPY (EGD) WITH PROPOFOL N/A 11/11/2018   Procedure: ESOPHAGOGASTRODUODENOSCOPY (EGD) WITH PROPOFOL;  Surgeon: [name], MD;  Location: [facility] ENDOSCOPY;  Service: Endoscopy;  Laterality: N/A;   EXPLORATORY LAPAROTOMY  1999   KIDNEY STONE SURGERY  04/2016   REVISION UROSTOMY CUTANEOUS     REVISION UROSTOMY CUTANEOUS  01/10/2018   SUPRAPUBIC CATHETER PLACEMENT  08/2017    TONSILLECTOMY      There were no vitals filed for this visit.   Subjective Assessment - 12/02/20 1227     Subjective Patient reports she is having a virtual appointment with her doctor tomorrow. 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 Due to the rarity of her disorder it took years prior to diagnosis. 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  Patient has good days and bad days. Her good days allow for short ambulation (within room only not between rooms) and her bad days she is unable to ambulate.    Limitations Lifting;Standing;Walking;House hold activities;Other (comment)    Patient Stated Goals to regain PLOF    Currently in Pain? Yes    Pain Score 6     Pain Orientation Lower;Medial    Pain Descriptors / Indicators Aching    Pain Type Neuropathic pain    Pain Onset More than a month ago    Pain Frequency Constant                   TherEx Supine: GTB  around bilateral knees: abduction 15x with focus on slow controlled movements.  GTB around bilateral knees; march with core contraction 12x each side 2.5 ankle weights:  -heel slides 12x each LE; modified for reduced back strain.  -march with TrA contraction 10x each LE    Sidelying : clamshells 12x each LE; cues for keeping heels together  Reverse clamshells 12x each LE   Seated: heat pad at back 2.5 ankle weights: -LAQ 12x each LE; cues for arc of motion and contraction of 3 second holds -march 12x each LE  -alternating IR/ER 15x each LE -heel raises 20x   Manual: Supine:  Hamstring lengthening 60 seconds each LE with  progressive range with prolonged hold.  Piriformis stretch 60 seconds each LE Single knee to chest 60 seconds each LE  LE rotation 60 seconds x 3 trials for low back pain reduction Sciatic nerve glide with leg on PT shoulder 30 seconds each LE   Stand pivot transfer with min A from PT to table   Pt educated throughout session about proper posture and technique with exercises. Improved exercise technique, movement at target joints, use of target muscles after min to mod verbal, visual, tactile cues    Patient presents in good spirits despite health issues and frustrations. She is able to tolerate resisted strengthening intervention well with no increase of tone or spasm. Back pain is present throughout session and utilization of heat pad in combination with interventions performed. Patient reports reduced pain by end of session however pain is still present, patient worried about continuation of UTI, will discuss with physician tomorrow. Patient will continue to benefit from skilled physical therapy to increase strength, functional mobility and activity tolerance for optimal functional independence in home and community and improved quality of life.                PT Education - 12/02/20 1228     Education Details exercise technique , pain reduction, body mechanics    Person(s) Educated Patient    Methods Explanation;Demonstration;Tactile cues;Verbal cues    Comprehension Verbalized understanding;Returned demonstration;Verbal cues required;Tactile cues required              PT Short Term Goals - 10/14/20 1739       PT SHORT TERM GOAL #1   Title Pt and caregivers will understand PT recommendation and appropriate/safe use for wheelchair and seating for home use.    Baseline met    Time 1    Period Days    Status Achieved               PT Long Term Goals - 11/02/20 0001       PT LONG TERM GOAL #1   Title Pt. will be able to participate in regular ADL's with  least restrictive device without pain increasing greater than 2/10    Baseline Pt. limited in her ability to perform household duties by increased pain and fatigue. As of 4/29: Her legs have more endurance with walking before she gets a tremor. Still needs to sit down the majority of the time she is cooking. Still needs back support for prolonged sitting. As of 6/10: Pt. able to do for  ~1 hour before she needs to rest to prevent increased pain, pain is averaging ~ 4-5/10 3/14: able to perform ADLs with less pain, does still have bad days 4/25: requires stops throughout process more fatigue than pain limiting 6/2: vas of 7/10 in pelvis occasionally    Time 12  Period Weeks    Status On-going    Target Date 12/09/20      PT LONG TERM GOAL #2   Title Patient will increase 10 meter walk test to <30 seconds as to improve gait speed for better community ambulation and to reduce fall risk.    Baseline 6/2: 1 min 18 seconds with rollator    Time 12    Period Weeks    Status New    Target Date 12/09/20      PT LONG TERM GOAL #3   Title Patient (< 52 years old) will complete five times sit to stand test in < 20 seconds indicating an increased LE strength and improved balance.    Baseline 6/2: 43 seconds with heavy BUE support    Time 12    Period Weeks    Status New    Target Date 12/09/20      PT LONG TERM GOAL #4   Title Pt. will be able to perform 1 hr. of light housework reqiring intermittent standing and walking without increased pain/fatigue >2 pts.    Baseline Pt. becomes fatigued washing fruits/vegetbles in small stages and has fatigue>3 pts. 3/14: able to perform but needs rest breaks 4/25: able to do but is fatiguing 6/2: able to perform on good days; unable to perform on bad days    Time 12    Period Weeks    Status Partially Met    Target Date 12/09/20      PT LONG TERM GOAL #5   Title Pt. will be able to go 2-3 hours between emptying her bladder without increased pain and empty  her bladder fully whether by urostomy, cath, or voluntary release.    Baseline Pt. has a urostomy but continues to have to self-catheterize. Has pain with bladder filling and when using catheter. As of 4/29: is doing better since starting to take the pregabalin andd baclofen but occasionally still has pain with bladder filling. It is bad when she has an infection or over-does activity, better on "normal" days which have been few and far between due to frequent UTI's. As of 6/10: Pt. has "good" and "bad" days but has more days where she can go 2-3 hours without increased pain. As of 04/15/20: able to wait 2 hours, not 3 yet 3/14: somedays able to meet this goals, some days is more frequent 4/25: good day: yes bad day every hour    Time 12    Period Weeks    Status Partially Met    Target Date 12/09/20      PT LONG TERM GOAL #6   Title Pt. will be able to attend a full athletic event for her children without Sx increasing by more than 2 pts.    Baseline Pt. having migraine or severe bladder spasms and fatigue and back pain> 3 pt. increase with attendance at athletic events 3/14: has increased pain but is able to attend now, has to use TENS unit 4/25: has not had any games to be able to attempt to go to 6/2: no games due to summer time    Time 12    Period Weeks    Status On-going    Target Date 12/09/20                   Plan - 12/02/20 1235     Clinical Impression Statement Patient presents in good spirits despite health issues and frustrations. She is able to tolerate resisted strengthening  intervention well with no increase of tone or spasm. Back pain is present throughout session and utilization of heat pad in combination with interventions performed. Patient reports reduced pain by end of session however pain is still present, patient worried about continuation of UTI, will discuss with physician tomorrow. Patient will continue to benefit from skilled physical therapy to increase  strength, functional mobility and activity tolerance for optimal functional independence in home and community and improved quality of life.    Personal Factors and Comorbidities Comorbidity 3+;Transportation;Time since onset of injury/illness/exacerbation;Finances    Comorbidities neurologic insult with pending diagnosis, multiple recurrent UTI's, BLE weakness, depression, hereditary spastic paraplegia    Examination-Activity Limitations Caring for Others;Carry;Continence;Squat;Stairs;Toileting;Locomotion Level;Lift;Stand;Bed Mobility;Bend;Reach Overhead;Sit;Dressing;Transfers    Examination-Participation Restrictions Church;Cleaning;Community Activity;Driving;Interpersonal Relationship;Laundry;[employer] Work;Meal Prep;School;Volunteer    Stability/Clinical Decision Making Unstable/Unpredictable    Rehab Potential Fair    PT Frequency 2x / week    PT Duration 12 weeks    PT Treatment/Interventions ADLs/Self Care Home Management;Aquatic Therapy;Biofeedback;Moist Heat;Electrical Stimulation;Cryotherapy;Traction;Ultrasound;Therapeutic activities;Functional mobility training;Stair training;Gait training;Therapeutic exercise;Balance training;Neuromuscular re-education;Patient/family education;Manual techniques;Dry needling;Scar mobilization;Energy conservation;Taping;Joint Manipulations;Spinal Manipulations;Visual/perceptual remediation/compensation;Passive range of motion;Wheelchair mobility training;DME Instruction;Orthotic Fit/Training    PT Next Visit Plan transfers, stabilization in standing, core activation    PT Home Exercise Plan no changes    Consulted and Agree with Plan of Care Patient             Patient will benefit from skilled therapeutic intervention in order to improve the following deficits and impairments:  Abnormal gait, Decreased balance, Decreased endurance, Decreased mobility, Difficulty walking, Increased muscle spasms, Impaired sensation, Improper body mechanics, Impaired tone,  Decreased activity tolerance, Decreased coordination, Decreased strength, Postural dysfunction, Pain, Impaired flexibility  Visit Diagnosis: Muscle weakness (generalized)  Abnormality of gait and mobility  Other abnormalities of gait and mobility     Problem List Patient Active Problem List   Diagnosis Date Noted   Seizure ([place]) 11/11/2018   Major depressive disorder, recurrent episode, moderate ([facility]) 02/07/2018   Nephrolithiasis 04/16/2016   Numbness 07/28/2015   Bladder retention 06/23/2015   Abdominal pain 06/04/2015   Dizziness 05/18/2015   Neck pain 83/29/1916   Complicated migraine 60/60/0459   Other fatigue 04/28/2015   Abnormal finding on MRI of brain 04/28/2015   D (diarrhea) 03/29/2015   H/O disease 03/29/2015   Abnormal weight loss 03/29/2015   Muscle weakness (generalized) 03/14/2015   Headache, migraine 03/10/2015   Janna Arch, PT, DPT  12/02/2020, 12:37 PM  [place] [facility]IN [facility] SERVICES [address] [place], [place], [address] Phone: [phone]   Fax:  [phone]  Name: Estle Sabella MRN: [id number] Date of Birth: March 27, 1981

## 2020-12-06 ENCOUNTER — Ambulatory Visit: Payer: BC Managed Care – PPO

## 2020-12-09 ENCOUNTER — Other Ambulatory Visit: Payer: Self-pay

## 2020-12-09 ENCOUNTER — Ambulatory Visit: Payer: BC Managed Care – PPO

## 2020-12-09 DIAGNOSIS — R269 Unspecified abnormalities of gait and mobility: Secondary | ICD-10-CM

## 2020-12-09 DIAGNOSIS — M6281 Muscle weakness (generalized): Secondary | ICD-10-CM | POA: Diagnosis not present

## 2020-12-09 DIAGNOSIS — R2689 Other abnormalities of gait and mobility: Secondary | ICD-10-CM

## 2020-12-09 NOTE — Therapy (Signed)
[place] MAIN [facility] SERVICES [address] [place], [place], [address] Phone: [phone]   Fax:  [phone]  Physical Therapy Treatment/RECERT/Physical Therapy Progress Note   Dates of reporting period  10/04/20   to   12/09/20   Patient Details  Name: Christine Chavez MRN: [id number] Date of Birth: Jul 29, 1980 Referring Provider (PT): Elza Rafter   Encounter Date: 12/09/2020   PT End of Session - 12/09/20 1159     Visit Number 120    Number of Visits 144    Date for PT Re-Evaluation 03/03/21    Authorization Type 10/10 PN 6/20; next session 1/10 PN 12/09/20    Authorization Time Period 06/28/20-09/20/20    PT Start Time 1100    PT Stop Time 1146    PT Time Calculation (min) 46 min    Equipment Utilized During Treatment Gait belt    Activity Tolerance Patient tolerated treatment well;No increased pain    Behavior During Therapy WFL for tasks assessed/performed             Past Medical History:  Diagnosis Date   Complication of anesthesia    ? seizures after anesthesia    Headache    Migraines    Neurogenic bladder    Renal disorder    Vision abnormalities     Past Surgical History:  Procedure Laterality Date   ANTERIOR CRUCIATE LIGAMENT REPAIR  1997   APPENDECTOMY     COLONOSCOPY WITH PROPOFOL N/A 11/11/2018   Procedure: COLONOSCOPY WITH PROPOFOL;  Surgeon: [name], MD;  Location: [facility] ENDOSCOPY;  Service: Endoscopy;  Laterality: N/A;   CYSTOSCOPY WITH STENT PLACEMENT Right 04/17/2016   Procedure: CYSTOSCOPY WITH STENT PLACEMENT;  Surgeon: [name], MD;  Location: [facility] ORS;  Service: Urology;  Laterality: Right;   ESOPHAGOGASTRODUODENOSCOPY (EGD) WITH PROPOFOL N/A 11/11/2018   Procedure: ESOPHAGOGASTRODUODENOSCOPY (EGD) WITH PROPOFOL;  Surgeon: [name], MD;  Location: [facility] ENDOSCOPY;  Service: Endoscopy;  Laterality: N/A;   EXPLORATORY LAPAROTOMY  1999   KIDNEY STONE SURGERY  04/2016    REVISION UROSTOMY CUTANEOUS     REVISION UROSTOMY CUTANEOUS  01/10/2018   SUPRAPUBIC CATHETER PLACEMENT  08/2017   TONSILLECTOMY      There were no vitals filed for this visit.   Subjective Assessment - 12/09/20 1158     Subjective Patient reports she has follow-up plans with your gynecologist due to potential UTI/bladder infection.  She is very fatigued and has increased tremors upon presentation today.    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 Due to the rarity of her disorder it took years prior to diagnosis. 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  Patient has good days and bad days. Her good days allow for short ambulation (within room only not between rooms) and her bad days she is unable to ambulate.    Limitations Lifting;Standing;Walking;House hold activities;Other (comment)    Patient Stated Goals to regain PLOF    Currently in Pain? Yes    Pain Score 6     Pain Location Back    Pain Orientation Lower;Mid    Pain Descriptors / Indicators Aching    Pain Type Neuropathic pain;Chronic pain    Pain Onset 1 to 4 weeks  ago    Pain Frequency Constant                   Goals:  Pt. will be able to participate in regular ADL's with least restrictive device without pain increasing greater than 2/10:   MET  10 MWT-defer to next session.  5x STS: 40 seconds SUE support  1 hour of light housework without pain/fatigue increase of >2/10 :recent challenge with bladder infection has 4-5/10.    Attend athletic event: can attend games that are handicap accessible. - progress to two consecutive events to attend childlren's events.    Add TUG: 1 min 47 seconds         TherEx Supine: GTB around bilateral knees: abduction 15x with focus on slow controlled movements.  GTB around bilateral knees; march with core contraction 12x each side Ball adduction squeezes 12x 3 second holds  Seated: LAQ 10x each LE; increased tremors as patient fatigued Heel toe raises 15x each LE     Manual: Supine:  Hamstring lengthening 60 seconds each LE with progressive range with prolonged hold.  Piriformis stretch 60 seconds each LE Single knee to chest 60 seconds each LE  LE rotation 60 seconds x 3 trials for low back pain reduction Sciatic nerve glide with leg on PT shoulder 30 seconds each LE   Stand pivot transfer with min A from PT to table   Pt educated throughout session about proper posture and technique with exercises. Improved exercise technique, movement at target joints, use of target muscles after min to mod verbal, visual, tactile cues    Patient's condition has the potential to improve in response to therapy. Maximum improvement is yet to be obtained. The anticipated improvement is attainable and reasonable in a generally predictable time.  Patient reports he is getting stronger but is not yet where she would like to be     Patient is able to progress from bilateral upper extremity support during 5 times sit to stand to single upper extremity support and a decrease time.  New goal of tug performed with patient having excessive bilateral lower extremity trembling and buckling of knees.  10-minute walk test deferred at this time due to limited ability to walk this session due to bladder infection/UTI.  Patient remains highly motivated despite her diagnosis.  Today was a "bad" day she is still able to make progress with her goals indicating generalized improvements in strength and mobility. Patient's condition has the potential to improve in response to therapy. Maximum improvement is yet to be obtained. The anticipated improvement is attainable and reasonable in a  generally predictable time.Patient will continue to benefit from skilled physical therapy to increase strength, functional mobility and activity tolerance for optimal functional independence in home and community and improved quality of life.           PT Education - 12/09/20 1159     Education Details Goals plan of care    Person(s) Educated Patient    Methods Explanation;Demonstration;Tactile cues;Verbal cues    Comprehension Verbalized understanding;Returned demonstration;Verbal cues required;Tactile cues required              PT Short Term Goals - 12/09/20 1201       PT SHORT TERM GOAL #1   Title Patient will demonstrate coordinated diaphragmatic breathing with pelvic tilts to demonstrate improved control of diaphragm and TA, to allow for further strengthening of core musculature and decreased pelvic floor spasm.    Baseline Pt. demonstrates  breathing dysfunction and poor PFM coordination evidenced by anal manometry As of 2/22: Pt. continues to have restriction in her diaphragm and near T/L junction but is able to intentionally use diaphragmatic breathing through the available ROM.    Time 5    Period Weeks    Status Achieved    Target Date 04/20/19      PT SHORT TERM GOAL #2   Title Patient will report a reduction in pain to no greater than 6/10 over the prior week to demonstrate symptom improvement.    Baseline Pain is 10/10 at worst, 2/10 at best As of 2/22: Pt. had achieved this goal for 1 week as of 1/21 but may have infection or other insult that caused pain to increase again. As of 4/29: Pain high of 8/10 but has been over-doing her activity over the past week. As of 6/10: Pt. was able to keep pain at 6 or below since prior visit by using TENS to help keep tension lower but has not had a full week to test if it will keep working. As of 7/6: Pt. had met this goal but has a new UTI and it caused pain to spike to 8/10 over the weekend. 8/25: 6/10 due to back pain    Time  5    Period Weeks    Status Achieved    Target Date 01/13/21      PT SHORT TERM GOAL #3   Title Patient will demonstrate HEP x1 in the clinic to demonstrate understanding and proper form to allow for further improvement.    Baseline Pt. lacks knowledge of therepeutic exercises that can decrease her pain/Sx.    Time 5    Period Weeks    Status Achieved    Target Date 04/20/19      PT SHORT TERM GOAL #4   Title Patient will report consistent use of foot-stool (squatty-potty) for positioning with BM to decrease pain with BM and intra-abdominal pressure.    Baseline Pt. having constipation due to PFM dysfunction    Time 5    Period Weeks    Status Achieved    Target Date 04/20/19               PT Long Term Goals - 12/09/20 0001       PT LONG TERM GOAL #1   Title Pt. will be able to participate in regular ADL's with least restrictive device without pain increasing greater than 2/10    Baseline Pt. limited in her ability to perform household duties by increased pain and fatigue. As of 4/29: Her legs have more endurance with walking before she gets a tremor. Still needs to sit down the majority of the time she is cooking. Still needs back support for prolonged sitting. As of 6/10: Pt. able to do for  ~1 hour before she needs to rest to prevent increased pain, pain is averaging ~ 4-5/10 3/14: able to perform ADLs with less pain, does still have bad days 4/25: requires stops throughout process more fatigue than pain limiting 6/2: vas of 7/10 in pelvis occasionally 8/25: able to perform ADLs without >2/10 pain increase    Time 12    Period Weeks    Status Achieved      PT LONG TERM GOAL #2   Title Patient will increase 10 meter walk test to <30 seconds as to improve gait speed for better community ambulation and to reduce fall risk.    Baseline 6/2: 1 min  18 seconds with rollator 8/25: deferred due to excessive knee buckling during TUG    Time 12    Period Weeks    Status On-going     Target Date 03/03/21      PT LONG TERM GOAL #3   Title Patient (< 80 years old) will complete five times sit to stand test in < 20 seconds indicating an increased LE strength and improved balance.    Baseline 6/2: 43 seconds with heavy BUE support 8/25: 40 seconds with SUE support    Time 12    Period Weeks    Status Partially Met    Target Date 03/03/21      PT LONG TERM GOAL #4   Title Pt. will be able to perform 1 hr. of light housework reqiring intermittent standing and walking without increased pain/fatigue >2 pts.    Baseline Pt. becomes fatigued washing fruits/vegetbles in small stages and has fatigue>3 pts. 3/14: able to perform but needs rest breaks 4/25: able to do but is fatiguing 6/2: able to perform on good days; unable to perform on bad days 8/25: 4-5/10    Time 12    Period Weeks    Status Partially Met    Target Date 03/03/21      PT LONG TERM GOAL #5   Title Patient will reduce timed up and go to <30 seconds to reduce fall risk and demonstrate improved transfer/gait ability.    Baseline 8/25: 1 min 47 seconds with rollator    Time 12    Period Weeks    Status New    Target Date 03/03/21      PT LONG TERM GOAL #6   Title Pt. will be able to attend a full athletic event for her children without Sx increasing by more than 2 pts.    Baseline Pt. having migraine or severe bladder spasms and fatigue and back pain> 3 pt. increase with attendance at athletic events 3/14: has increased pain but is able to attend now, has to use TENS unit 4/25: has not had any games to be able to attempt to go to 6/2: no games due to summer time 8/25: able to attend one event    Time 12    Period Weeks    Status Achieved      PT LONG TERM GOAL #7   Title Patient will attend two consecutive athletic events for her children indicating improved capacity for activities and quality of life.    Baseline 8/25: can only attend one at a time    Time 12    Period Weeks    Status New    Target  Date 03/03/21                   Plan - 12/09/20 1209     Clinical Impression Statement Patient is able to progress from bilateral upper extremity support during 5 times sit to stand to single upper extremity support and a decrease time.  New goal of tug performed with patient having excessive bilateral lower extremity trembling and buckling of knees.  10-minute walk test deferred at this time due to limited ability to walk this session due to bladder infection/UTI.  Patient remains highly motivated despite her diagnosis.  Today was a "bad" day she is still able to make progress with her goals indicating generalized improvements in strength and mobility. Patient's condition has the potential to improve in response to therapy. Maximum improvement is yet to be obtained.  The anticipated improvement is attainable and reasonable in a generally predictable time.Patient will continue to benefit from skilled physical therapy to increase strength, functional mobility and activity tolerance for optimal functional independence in home and community and improved quality of life.    Personal Factors and Comorbidities Comorbidity 3+;Transportation;Time since onset of injury/illness/exacerbation;Finances    Comorbidities neurologic insult with pending diagnosis, multiple recurrent UTI's, BLE weakness, depression, hereditary spastic paraplegia    Examination-Activity Limitations Caring for Others;Carry;Continence;Squat;Stairs;Toileting;Locomotion Level;Lift;Stand;Bed Mobility;Bend;Reach Overhead;Sit;Dressing;Transfers    Examination-Participation Restrictions Church;Cleaning;Community Activity;Driving;Interpersonal Relationship;Laundry;[employer] Work;Meal Prep;School;Volunteer    Stability/Clinical Decision Making Unstable/Unpredictable    Rehab Potential Fair    PT Frequency 2x / week    PT Duration 12 weeks    PT Treatment/Interventions ADLs/Self Care Home Management;Aquatic Therapy;Biofeedback;Moist  Heat;Electrical Stimulation;Cryotherapy;Traction;Ultrasound;Therapeutic activities;Functional mobility training;Stair training;Gait training;Therapeutic exercise;Balance training;Neuromuscular re-education;Patient/family education;Manual techniques;Dry needling;Scar mobilization;Energy conservation;Taping;Joint Manipulations;Spinal Manipulations;Visual/perceptual remediation/compensation;Passive range of motion;Wheelchair mobility training;DME Instruction;Orthotic Fit/Training    PT Next Visit Plan transfers, stabilization in standing, core activation    PT Home Exercise Plan no changes    Consulted and Agree with Plan of Care Patient             Patient will benefit from skilled therapeutic intervention in order to improve the following deficits and impairments:  Abnormal gait, Decreased balance, Decreased endurance, Decreased mobility, Difficulty walking, Increased muscle spasms, Impaired sensation, Improper body mechanics, Impaired tone, Decreased activity tolerance, Decreased coordination, Decreased strength, Postural dysfunction, Pain, Impaired flexibility  Visit Diagnosis: Muscle weakness (generalized)  Abnormality of gait and mobility  Other abnormalities of gait and mobility     Problem List Patient Active Problem List   Diagnosis Date Noted   Seizure ([place]) 11/11/2018   Major depressive disorder, recurrent episode, moderate ([facility]) 02/07/2018   Nephrolithiasis 04/16/2016   Numbness 07/28/2015   Bladder retention 06/23/2015   Abdominal pain 06/04/2015   Dizziness 05/18/2015   Neck pain 57/97/2820   Complicated migraine 60/15/6153   Other fatigue 04/28/2015   Abnormal finding on MRI of brain 04/28/2015   D (diarrhea) 03/29/2015   H/O disease 03/29/2015   Abnormal weight loss 03/29/2015   Muscle weakness (generalized) 03/14/2015   Headache, migraine 03/10/2015    Janna Arch, PT, DPT  12/09/2020, 12:10 PM  [place] MAIN [facility]  SERVICES [address] [place], [place], [address] Phone: [phone]   Fax:  [phone]  Name: Christine Chavez MRN: [id number] Date of Birth: Mar 02, 1981

## 2020-12-13 ENCOUNTER — Ambulatory Visit: Payer: BC Managed Care – PPO

## 2020-12-13 ENCOUNTER — Other Ambulatory Visit: Payer: Self-pay

## 2020-12-13 DIAGNOSIS — R269 Unspecified abnormalities of gait and mobility: Secondary | ICD-10-CM

## 2020-12-13 DIAGNOSIS — M6281 Muscle weakness (generalized): Secondary | ICD-10-CM

## 2020-12-13 DIAGNOSIS — R2689 Other abnormalities of gait and mobility: Secondary | ICD-10-CM

## 2020-12-13 NOTE — Therapy (Signed)
[place] MAIN [facility] SERVICES [address] [place], [place], [address] Phone: [phone]   Fax:  [phone]  Physical Therapy Treatment  Patient Details  Name: Christine Chavez MRN: [id number] Date of Birth: 08/17/1980 Referring Provider (PT): Elza Rafter   Encounter Date: 12/13/2020   PT End of Session - 12/13/20 1231     Visit Number [id number]    Number of Visits 144    Date for PT Re-Evaluation 03/03/21    Authorization Type 1/10 PN 12/09/20    Authorization Time Period 06/28/20-09/20/20    PT Start Time 1015    PT Stop Time 1059    PT Time Calculation (min) 44 min    Equipment Utilized During Treatment Gait belt    Activity Tolerance Patient tolerated treatment well;No increased pain    Behavior During Therapy WFL for tasks assessed/performed             Past Medical History:  Diagnosis Date   Complication of anesthesia    ? seizures after anesthesia    Headache    Migraines    Neurogenic bladder    Renal disorder    Vision abnormalities     Past Surgical History:  Procedure Laterality Date   ANTERIOR CRUCIATE LIGAMENT REPAIR  1997   APPENDECTOMY     COLONOSCOPY WITH PROPOFOL N/A 11/11/2018   Procedure: COLONOSCOPY WITH PROPOFOL;  Surgeon: [name], MD;  Location: [facility] ENDOSCOPY;  Service: Endoscopy;  Laterality: N/A;   CYSTOSCOPY WITH STENT PLACEMENT Right 04/17/2016   Procedure: CYSTOSCOPY WITH STENT PLACEMENT;  Surgeon: [name], MD;  Location: [facility] ORS;  Service: Urology;  Laterality: Right;   ESOPHAGOGASTRODUODENOSCOPY (EGD) WITH PROPOFOL N/A 11/11/2018   Procedure: ESOPHAGOGASTRODUODENOSCOPY (EGD) WITH PROPOFOL;  Surgeon: [name], MD;  Location: [facility] ENDOSCOPY;  Service: Endoscopy;  Laterality: N/A;   EXPLORATORY LAPAROTOMY  1999   KIDNEY STONE SURGERY  04/2016   REVISION UROSTOMY CUTANEOUS     REVISION UROSTOMY CUTANEOUS  01/10/2018   SUPRAPUBIC CATHETER PLACEMENT  08/2017    TONSILLECTOMY      There were no vitals filed for this visit.   Subjective Assessment - 12/13/20 1229     Subjective Patient reports she is very fatigued today. Has been having increased leg buckling today. Will have an ablation on wednesday.    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 Due to the rarity of her disorder it took years prior to diagnosis. 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  Patient has good days and bad days. Her good days allow for short ambulation (within room only not between rooms) and her bad days she is unable to ambulate.    Limitations Lifting;Standing;Walking;House hold activities;Other (comment)    Patient Stated Goals to regain PLOF    Currently in Pain? Yes    Pain Score 5     Pain Location Back    Pain Orientation Lower;Mid    Pain Descriptors / Indicators Aching    Pain Type Neuropathic pain;Chronic pain    Pain Onset 1 to 4 weeks ago    Pain Frequency Constant                  TherEx Bridge 10x with feet  stabilized.  3lb ankle weights:  -march 12x each LE -heel slide 12x each Les; modified for decreased pain  Seated: 3lb ankle weight:  -LAQ 12x each LE  -IR/ER 15x each LE cue for arc of motion.  -bilateral heel raise 15x bilaterally  GTB abduction 15x  GTB hamstring curl 10x each LE    Manual: Supine: Single knee to chest hold 30 seconds each LE Figure four piriformis stretch 30 seconds each LE   Hamstring lengthening 60 seconds each LE with progressive range with prolonged hold.  LE rotation 60 seconds x 3 trials for low back pain reduction Sciatic nerve glide with leg on PT shoulder 30 seconds each LE       Pt educated  throughout session about proper posture and technique with exercises. Improved exercise technique, movement at target joints, use of target muscles after min to mod verbal, visual, tactile cues   Patiet tolerated supine and seated interventions well this afternoon. She is limited in standing by bilateral knee buckling so therapist deferred standing this session due to safety. Ankle resistance tolerated well. Decreased tone noted today however patient does have increased difficulty with muscle activation in all positions. Patient will continue to benefit from skilled physical therapy to increase strength, functional mobility and activity tolerance for optimal functional independence in home and community and improved quality of life.                   PT Education - 12/13/20 1230     Education Details exercise technique, body mechanics    Person(s) Educated Patient    Methods Explanation;Demonstration;Tactile cues;Verbal cues    Comprehension Verbalized understanding;Returned demonstration;Verbal cues required;Tactile cues required              PT Short Term Goals - 12/09/20 1201       PT SHORT TERM GOAL #1   Title Patient will demonstrate coordinated diaphragmatic breathing with pelvic tilts to demonstrate improved control of diaphragm and TA, to allow for further strengthening of core musculature and decreased pelvic floor spasm.    Baseline Pt. demonstrates breathing dysfunction and poor PFM coordination evidenced by anal manometry As of 2/22: Pt. continues to have restriction in her diaphragm and near T/L junction but is able to intentionally use diaphragmatic breathing through the available ROM.    Time 5    Period Weeks    Status Achieved    Target Date 04/20/19      PT SHORT TERM GOAL #2   Title Patient will report a reduction in pain to no greater than 6/10 over the prior week to demonstrate symptom improvement.    Baseline Pain is 10/10 at worst, 2/10 at best As  of 2/22: Pt. had achieved this goal for 1 week as of 1/21 but may have infection or other insult that caused pain to increase again. As of 4/29: Pain high of 8/10 but has been over-doing her activity over the past week. As of 6/10: Pt. was able to keep pain at 6 or below since prior visit by using TENS to help keep tension lower but has not had a full week to test if it will keep working. As of 7/6: Pt. had met this goal but has a new UTI and it caused pain to spike to 8/10 over the weekend. 8/25: 6/10 due to back pain    Time 5    Period Weeks    Status Achieved    Target Date 01/13/21  PT SHORT TERM GOAL #3   Title Patient will demonstrate HEP x1 in the clinic to demonstrate understanding and proper form to allow for further improvement.    Baseline Pt. lacks knowledge of therepeutic exercises that can decrease her pain/Sx.    Time 5    Period Weeks    Status Achieved    Target Date 04/20/19      PT SHORT TERM GOAL #4   Title Patient will report consistent use of foot-stool (squatty-potty) for positioning with BM to decrease pain with BM and intra-abdominal pressure.    Baseline Pt. having constipation due to PFM dysfunction    Time 5    Period Weeks    Status Achieved    Target Date 04/20/19               PT Long Term Goals - 12/09/20 0001       PT LONG TERM GOAL #1   Title Pt. will be able to participate in regular ADL's with least restrictive device without pain increasing greater than 2/10    Baseline Pt. limited in her ability to perform household duties by increased pain and fatigue. As of 4/29: Her legs have more endurance with walking before she gets a tremor. Still needs to sit down the majority of the time she is cooking. Still needs back support for prolonged sitting. As of 6/10: Pt. able to do for  ~1 hour before she needs to rest to prevent increased pain, pain is averaging ~ 4-5/10 3/14: able to perform ADLs with less pain, does still have bad days 4/25: requires  stops throughout process more fatigue than pain limiting 6/2: vas of 7/10 in pelvis occasionally 8/25: able to perform ADLs without >2/10 pain increase    Time 12    Period Weeks    Status Achieved      PT LONG TERM GOAL #2   Title Patient will increase 10 meter walk test to <30 seconds as to improve gait speed for better community ambulation and to reduce fall risk.    Baseline 6/2: 1 min 18 seconds with rollator 8/25: deferred due to excessive knee buckling during TUG    Time 12    Period Weeks    Status On-going    Target Date 03/03/21      PT LONG TERM GOAL #3   Title Patient (< 95 years old) will complete five times sit to stand test in < 20 seconds indicating an increased LE strength and improved balance.    Baseline 6/2: 43 seconds with heavy BUE support 8/25: 40 seconds with SUE support    Time 12    Period Weeks    Status Partially Met    Target Date 03/03/21      PT LONG TERM GOAL #4   Title Pt. will be able to perform 1 hr. of light housework reqiring intermittent standing and walking without increased pain/fatigue >2 pts.    Baseline Pt. becomes fatigued washing fruits/vegetbles in small stages and has fatigue>3 pts. 3/14: able to perform but needs rest breaks 4/25: able to do but is fatiguing 6/2: able to perform on good days; unable to perform on bad days 8/25: 4-5/10    Time 12    Period Weeks    Status Partially Met    Target Date 03/03/21      PT LONG TERM GOAL #5   Title Patient will reduce timed up and go to <30 seconds to reduce fall risk and  demonstrate improved transfer/gait ability.    Baseline 8/25: 1 min 47 seconds with rollator    Time 12    Period Weeks    Status New    Target Date 03/03/21      PT LONG TERM GOAL #6   Title Pt. will be able to attend a full athletic event for her children without Sx increasing by more than 2 pts.    Baseline Pt. having migraine or severe bladder spasms and fatigue and back pain> 3 pt. increase with attendance at  athletic events 3/14: has increased pain but is able to attend now, has to use TENS unit 4/25: has not had any games to be able to attempt to go to 6/2: no games due to summer time 8/25: able to attend one event    Time 12    Period Weeks    Status Achieved      PT LONG TERM GOAL #7   Title Patient will attend two consecutive athletic events for her children indicating improved capacity for activities and quality of life.    Baseline 8/25: can only attend one at a time    Time 12    Period Weeks    Status New    Target Date 03/03/21                   Plan - 12/13/20 1234     Clinical Impression Statement Patient tolerated supine and seated interventions well this afternoon. She is limited in standing by bilateral knee buckling so therapist deferred standing this session due to safety. Ankle resistance tolerated well. Decreased tone noted today however patient does have increased difficulty with muscle activation in all positions. Patient will continue to benefit from skilled physical therapy to increase strength, functional mobility and activity tolerance for optimal functional independence in home and community and improved quality of life.    Personal Factors and Comorbidities Comorbidity 3+;Transportation;Time since onset of injury/illness/exacerbation;Finances    Comorbidities neurologic insult with pending diagnosis, multiple recurrent UTI's, BLE weakness, depression, hereditary spastic paraplegia    Examination-Activity Limitations Caring for Others;Carry;Continence;Squat;Stairs;Toileting;Locomotion Level;Lift;Stand;Bed Mobility;Bend;Reach Overhead;Sit;Dressing;Transfers    Examination-Participation Restrictions Church;Cleaning;Community Activity;Driving;Interpersonal Relationship;Laundry;[employer] Work;Meal Prep;School;Volunteer    Stability/Clinical Decision Making Unstable/Unpredictable    Rehab Potential Fair    PT Frequency 2x / week    PT Duration 12 weeks    PT  Treatment/Interventions ADLs/Self Care Home Management;Aquatic Therapy;Biofeedback;Moist Heat;Electrical Stimulation;Cryotherapy;Traction;Ultrasound;Therapeutic activities;Functional mobility training;Stair training;Gait training;Therapeutic exercise;Balance training;Neuromuscular re-education;Patient/family education;Manual techniques;Dry needling;Scar mobilization;Energy conservation;Taping;Joint Manipulations;Spinal Manipulations;Visual/perceptual remediation/compensation;Passive range of motion;Wheelchair mobility training;DME Instruction;Orthotic Fit/Training    PT Next Visit Plan transfers, stabilization in standing, core activation    PT Home Exercise Plan no changes    Consulted and Agree with Plan of Care Patient             Patient will benefit from skilled therapeutic intervention in order to improve the following deficits and impairments:  Abnormal gait, Decreased balance, Decreased endurance, Decreased mobility, Difficulty walking, Increased muscle spasms, Impaired sensation, Improper body mechanics, Impaired tone, Decreased activity tolerance, Decreased coordination, Decreased strength, Postural dysfunction, Pain, Impaired flexibility  Visit Diagnosis: Muscle weakness (generalized)  Abnormality of gait and mobility  Other abnormalities of gait and mobility     Problem List Patient Active Problem List   Diagnosis Date Noted   Seizure ([place]) 11/11/2018   Major depressive disorder, recurrent episode, moderate ([facility]) 02/07/2018   Nephrolithiasis 04/16/2016   Numbness 07/28/2015   Bladder retention 06/23/2015   Abdominal pain 06/04/2015  Dizziness 05/18/2015   Neck pain 56/86/1683   Complicated migraine 72/90/2111   Other fatigue 04/28/2015   Abnormal finding on MRI of brain 04/28/2015   D (diarrhea) 03/29/2015   H/O disease 03/29/2015   Abnormal weight loss 03/29/2015   Muscle weakness (generalized) 03/14/2015   Headache, migraine 03/10/2015    Janna Arch, PT,  DPT  12/13/2020, 12:36 PM  [place] MAIN [facility] SERVICES [address] [place], [place], [address] Phone: [phone]   Fax:  [phone]  Name: Christine Chavez MRN: [id number] Date of Birth: Aug 17, 1980

## 2020-12-16 ENCOUNTER — Ambulatory Visit: Payer: BC Managed Care – PPO | Attending: Gastroenterology

## 2020-12-16 ENCOUNTER — Other Ambulatory Visit: Payer: Self-pay

## 2020-12-16 DIAGNOSIS — M62838 Other muscle spasm: Secondary | ICD-10-CM | POA: Diagnosis present

## 2020-12-16 DIAGNOSIS — R262 Difficulty in walking, not elsewhere classified: Secondary | ICD-10-CM | POA: Diagnosis present

## 2020-12-16 DIAGNOSIS — R2689 Other abnormalities of gait and mobility: Secondary | ICD-10-CM | POA: Diagnosis present

## 2020-12-16 DIAGNOSIS — R269 Unspecified abnormalities of gait and mobility: Secondary | ICD-10-CM | POA: Diagnosis present

## 2020-12-16 DIAGNOSIS — M6281 Muscle weakness (generalized): Secondary | ICD-10-CM | POA: Diagnosis not present

## 2020-12-16 NOTE — Therapy (Signed)
[place] MAIN [facility] SERVICES [address] [place], [place], [address] Phone: [phone]   Fax:  [phone]  Physical Therapy Treatment  Patient Details  Name: Christine Chavez MRN: [id number] Date of Birth: 1981/01/16 Referring Provider (PT): Elza Rafter   Encounter Date: 12/16/2020   PT End of Session - 12/16/20 1058     Visit Number 122    Number of Visits 144    Date for PT Re-Evaluation 03/03/21    Authorization Type 1/10 PN 12/09/20    Authorization Time Period 06/28/20-09/20/20    PT Start Time 1100    PT Stop Time 1144    PT Time Calculation (min) 44 min    Equipment Utilized During Treatment Gait belt    Activity Tolerance Patient tolerated treatment well;No increased pain    Behavior During Therapy WFL for tasks assessed/performed             Past Medical History:  Diagnosis Date   Complication of anesthesia    ? seizures after anesthesia    Headache    Migraines    Neurogenic bladder    Renal disorder    Vision abnormalities     Past Surgical History:  Procedure Laterality Date   ANTERIOR CRUCIATE LIGAMENT REPAIR  1997   APPENDECTOMY     COLONOSCOPY WITH PROPOFOL N/A 11/11/2018   Procedure: COLONOSCOPY WITH PROPOFOL;  Surgeon: [name], MD;  Location: [facility] ENDOSCOPY;  Service: Endoscopy;  Laterality: N/A;   CYSTOSCOPY WITH STENT PLACEMENT Right 04/17/2016   Procedure: CYSTOSCOPY WITH STENT PLACEMENT;  Surgeon: [name], MD;  Location: [facility] ORS;  Service: Urology;  Laterality: Right;   ESOPHAGOGASTRODUODENOSCOPY (EGD) WITH PROPOFOL N/A 11/11/2018   Procedure: ESOPHAGOGASTRODUODENOSCOPY (EGD) WITH PROPOFOL;  Surgeon: [name], MD;  Location: [facility] ENDOSCOPY;  Service: Endoscopy;  Laterality: N/A;   EXPLORATORY LAPAROTOMY  1999   KIDNEY STONE SURGERY  04/2016   REVISION UROSTOMY CUTANEOUS     REVISION UROSTOMY CUTANEOUS  01/10/2018   SUPRAPUBIC CATHETER PLACEMENT  08/2017    TONSILLECTOMY      There were no vitals filed for this visit.   Subjective Assessment - 12/16/20 1225     Subjective Patient reports she was unable to have the ablation yesterday due to having a UTI. Having pain in her back in her kidney region.    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 Due to the rarity of her disorder it took years prior to diagnosis. 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  Patient has good days and bad days. Her good days allow for short ambulation (within room only not between rooms) and her bad days she is unable to ambulate.    Limitations Lifting;Standing;Walking;House hold activities;Other (comment)    Patient Stated Goals to regain PLOF    Currently in Pain? Yes    Pain Score 5     Pain Location Back    Pain Orientation Lower;Mid    Pain Descriptors / Indicators Aching    Pain Type Neuropathic pain    Pain Onset 1 to 4 weeks ago    Pain Frequency Constant                TherEx Bridge 10x with  feet stabilized.  Green swiss ball TrA activation 10x 3 second holds GTB abduction 15x  Green swiss ball TrA activation with overhead raise 10x each UE  3lb ankle weights:  -march 12x each LE -heel slide 12x each Les; modified for decreased pain   Seated: 3lb ankle weight:  -LAQ 12x each LE  -IR/ER 15x each LE cue for arc of motion.  -bilateral heel raise 15x bilaterally    GTB hamstring curl 10x each LE    Manual: Supine: Single knee to chest hold 30 seconds each LE Figure four piriformis stretch 30 seconds each LE   Hamstring lengthening 60 seconds each LE with progressive range with prolonged hold.  LE rotation 60 seconds x 3 trials for  low back pain reduction Sciatic nerve glide with leg on PT shoulder 30 seconds each LE        Pt educated throughout session about proper posture and technique with exercises. Improved exercise technique, movement at target joints, use of target muscles after min to mod verbal, visual, tactile cues  Patient presents with increased pain and fatigue from UTI. She remains highly motivated despite symptoms and tolerated progressive core stabilization interventions. Patient has limited standing tolerance due to fatigue so session primarily performed in supine and seated position. Patient will continue to benefit from skilled physical therapy to increase strength, functional mobility and activity tolerance for optimal functional independence in home and community and improved quality of life.                     PT Education - 12/16/20 1058     Education Details exercise technique, body mechanics    Person(s) Educated Patient    Methods Explanation;Demonstration;Tactile cues;Verbal cues    Comprehension Verbalized understanding;Returned demonstration;Verbal cues required;Tactile cues required              PT Short Term Goals - 12/09/20 1201       PT SHORT TERM GOAL #1   Title Patient will demonstrate coordinated diaphragmatic breathing with pelvic tilts to demonstrate improved control of diaphragm and TA, to allow for further strengthening of core musculature and decreased pelvic floor spasm.    Baseline Pt. demonstrates breathing dysfunction and poor PFM coordination evidenced by anal manometry As of 2/22: Pt. continues to have restriction in her diaphragm and near T/L junction but is able to intentionally use diaphragmatic breathing through the available ROM.    Time 5    Period Weeks    Status Achieved    Target Date 04/20/19      PT SHORT TERM GOAL #2   Title Patient will report a reduction in pain to no greater than 6/10 over the prior week to demonstrate symptom  improvement.    Baseline Pain is 10/10 at worst, 2/10 at best As of 2/22: Pt. had achieved this goal for 1 week as of 1/21 but may have infection or other insult that caused pain to increase again. As of 4/29: Pain high of 8/10 but has been over-doing her activity over the past week. As of 6/10: Pt. was able to keep pain at 6 or below since prior visit by using TENS to help keep tension lower but has not had a full week to test if it will keep working. As of 7/6: Pt. had met this goal but has a new UTI and it caused pain to spike to 8/10 over the weekend. 8/25: 6/10 due to back pain    Time 5  Period Weeks    Status Achieved    Target Date 01/13/21      PT SHORT TERM GOAL #3   Title Patient will demonstrate HEP x1 in the clinic to demonstrate understanding and proper form to allow for further improvement.    Baseline Pt. lacks knowledge of therepeutic exercises that can decrease her pain/Sx.    Time 5    Period Weeks    Status Achieved    Target Date 04/20/19      PT SHORT TERM GOAL #4   Title Patient will report consistent use of foot-stool (squatty-potty) for positioning with BM to decrease pain with BM and intra-abdominal pressure.    Baseline Pt. having constipation due to PFM dysfunction    Time 5    Period Weeks    Status Achieved    Target Date 04/20/19               PT Long Term Goals - 12/09/20 0001       PT LONG TERM GOAL #1   Title Pt. will be able to participate in regular ADL's with least restrictive device without pain increasing greater than 2/10    Baseline Pt. limited in her ability to perform household duties by increased pain and fatigue. As of 4/29: Her legs have more endurance with walking before she gets a tremor. Still needs to sit down the majority of the time she is cooking. Still needs back support for prolonged sitting. As of 6/10: Pt. able to do for  ~1 hour before she needs to rest to prevent increased pain, pain is averaging ~ 4-5/10 3/14: able to  perform ADLs with less pain, does still have bad days 4/25: requires stops throughout process more fatigue than pain limiting 6/2: vas of 7/10 in pelvis occasionally 8/25: able to perform ADLs without >2/10 pain increase    Time 12    Period Weeks    Status Achieved      PT LONG TERM GOAL #2   Title Patient will increase 10 meter walk test to <30 seconds as to improve gait speed for better community ambulation and to reduce fall risk.    Baseline 6/2: 1 min 18 seconds with rollator 8/25: deferred due to excessive knee buckling during TUG    Time 12    Period Weeks    Status On-going    Target Date 03/03/21      PT LONG TERM GOAL #3   Title Patient (< 8 years old) will complete five times sit to stand test in < 20 seconds indicating an increased LE strength and improved balance.    Baseline 6/2: 43 seconds with heavy BUE support 8/25: 40 seconds with SUE support    Time 12    Period Weeks    Status Partially Met    Target Date 03/03/21      PT LONG TERM GOAL #4   Title Pt. will be able to perform 1 hr. of light housework reqiring intermittent standing and walking without increased pain/fatigue >2 pts.    Baseline Pt. becomes fatigued washing fruits/vegetbles in small stages and has fatigue>3 pts. 3/14: able to perform but needs rest breaks 4/25: able to do but is fatiguing 6/2: able to perform on good days; unable to perform on bad days 8/25: 4-5/10    Time 12    Period Weeks    Status Partially Met    Target Date 03/03/21      PT LONG TERM GOAL #5  Title Patient will reduce timed up and go to <30 seconds to reduce fall risk and demonstrate improved transfer/gait ability.    Baseline 8/25: 1 min 47 seconds with rollator    Time 12    Period Weeks    Status New    Target Date 03/03/21      PT LONG TERM GOAL #6   Title Pt. will be able to attend a full athletic event for her children without Sx increasing by more than 2 pts.    Baseline Pt. having migraine or severe bladder  spasms and fatigue and back pain> 3 pt. increase with attendance at athletic events 3/14: has increased pain but is able to attend now, has to use TENS unit 4/25: has not had any games to be able to attempt to go to 6/2: no games due to summer time 8/25: able to attend one event    Time 12    Period Weeks    Status Achieved      PT LONG TERM GOAL #7   Title Patient will attend two consecutive athletic events for her children indicating improved capacity for activities and quality of life.    Baseline 8/25: can only attend one at a time    Time 12    Period Weeks    Status New    Target Date 03/03/21                   Plan - 12/16/20 1231     Clinical Impression Statement Patient presents with increased pain and fatigue from UTI. She remains highly motivated despite symptoms and tolerated progressive core stabilization interventions. Patient has limited standing tolerance due to fatigue so session primarily performed in supine and seated position. Patient will continue to benefit from skilled physical therapy to increase strength, functional mobility and activity tolerance for optimal functional independence in home and community and improved quality of life.    Personal Factors and Comorbidities Comorbidity 3+;Transportation;Time since onset of injury/illness/exacerbation;Finances    Comorbidities neurologic insult with pending diagnosis, multiple recurrent UTI's, BLE weakness, depression, hereditary spastic paraplegia    Examination-Activity Limitations Caring for Others;Carry;Continence;Squat;Stairs;Toileting;Locomotion Level;Lift;Stand;Bed Mobility;Bend;Reach Overhead;Sit;Dressing;Transfers    Examination-Participation Restrictions Church;Cleaning;Community Activity;Driving;Interpersonal Relationship;Laundry;[employer] Work;Meal Prep;School;Volunteer    Stability/Clinical Decision Making Unstable/Unpredictable    Rehab Potential Fair    PT Frequency 2x / week    PT Duration 12 weeks     PT Treatment/Interventions ADLs/Self Care Home Management;Aquatic Therapy;Biofeedback;Moist Heat;Electrical Stimulation;Cryotherapy;Traction;Ultrasound;Therapeutic activities;Functional mobility training;Stair training;Gait training;Therapeutic exercise;Balance training;Neuromuscular re-education;Patient/family education;Manual techniques;Dry needling;Scar mobilization;Energy conservation;Taping;Joint Manipulations;Spinal Manipulations;Visual/perceptual remediation/compensation;Passive range of motion;Wheelchair mobility training;DME Instruction;Orthotic Fit/Training    PT Next Visit Plan transfers, stabilization in standing, core activation    PT Home Exercise Plan no changes    Consulted and Agree with Plan of Care Patient             Patient will benefit from skilled therapeutic intervention in order to improve the following deficits and impairments:  Abnormal gait, Decreased balance, Decreased endurance, Decreased mobility, Difficulty walking, Increased muscle spasms, Impaired sensation, Improper body mechanics, Impaired tone, Decreased activity tolerance, Decreased coordination, Decreased strength, Postural dysfunction, Pain, Impaired flexibility  Visit Diagnosis: Muscle weakness (generalized)  Abnormality of gait and mobility  Other abnormalities of gait and mobility     Problem List Patient Active Problem List   Diagnosis Date Noted   Seizure ([place]) 11/11/2018   Major depressive disorder, recurrent episode, moderate ([place]) 02/07/2018   Nephrolithiasis 04/16/2016   Numbness 07/28/2015   Bladder  retention 06/23/2015   Abdominal pain 06/04/2015   Dizziness 05/18/2015   Neck pain 27/78/2423   Complicated migraine 53/61/4431   Other fatigue 04/28/2015   Abnormal finding on MRI of brain 04/28/2015   D (diarrhea) 03/29/2015   H/O disease 03/29/2015   Abnormal weight loss 03/29/2015   Muscle weakness (generalized) 03/14/2015   Headache, migraine 03/10/2015   Janna Arch,  PT, DPT  12/16/2020, 12:46 PM  [place] MAIN [facility] SERVICES [address] [place], [place], [address] Phone: [phone]   Fax:  [phone]  Name: Christine Chavez MRN: [id number] Date of Birth: October 14, 1980

## 2020-12-23 ENCOUNTER — Encounter: Payer: Self-pay | Admitting: Obstetrics and Gynecology

## 2020-12-23 ENCOUNTER — Ambulatory Visit: Payer: BC Managed Care – PPO

## 2020-12-23 ENCOUNTER — Other Ambulatory Visit (HOSPITAL_COMMUNITY)
Admission: RE | Admit: 2020-12-23 | Discharge: 2020-12-23 | Disposition: A | Discharge status: Home / Self Care | Payer: BC Managed Care – PPO | Source: Ambulatory Visit | Attending: Obstetrics and Gynecology | Admitting: Obstetrics and Gynecology

## 2020-12-23 ENCOUNTER — Other Ambulatory Visit: Payer: Self-pay

## 2020-12-23 ENCOUNTER — Ambulatory Visit (INDEPENDENT_AMBULATORY_CARE_PROVIDER_SITE_OTHER): Payer: BC Managed Care – PPO | Admitting: Obstetrics and Gynecology

## 2020-12-23 VITALS — BP 115/81 | HR 79 | Ht 67.0 in | Wt 142.0 lb

## 2020-12-23 DIAGNOSIS — M6281 Muscle weakness (generalized): Secondary | ICD-10-CM

## 2020-12-23 DIAGNOSIS — B373 Candidiasis of vulva and vagina: Secondary | ICD-10-CM | POA: Diagnosis not present

## 2020-12-23 DIAGNOSIS — N921 Excessive and frequent menstruation with irregular cycle: Secondary | ICD-10-CM

## 2020-12-23 DIAGNOSIS — N760 Acute vaginitis: Secondary | ICD-10-CM | POA: Diagnosis not present

## 2020-12-23 DIAGNOSIS — R87610 Atypical squamous cells of undetermined significance on cytologic smear of cervix (ASC-US): Secondary | ICD-10-CM

## 2020-12-23 DIAGNOSIS — R269 Unspecified abnormalities of gait and mobility: Secondary | ICD-10-CM

## 2020-12-23 DIAGNOSIS — R2689 Other abnormalities of gait and mobility: Secondary | ICD-10-CM

## 2020-12-23 DIAGNOSIS — B3731 Acute candidiasis of vulva and vagina: Secondary | ICD-10-CM

## 2020-12-23 MED ORDER — FLUCONAZOLE 150 MG PO TABS: 150.0000 mg | ORAL_TABLET | Freq: Once | ORAL | 0 refills | Status: AC

## 2020-12-23 NOTE — Therapy (Signed)
[place] [facility] SERVICES [address] [place], [place], [address] Phone: [phone]   Fax:  [phone]  Physical Therapy Treatment  Patient Details  Name: Christine Chavez MRN: [id number] Date of Birth: September 21, 1980 Referring Provider (PT): Elza Rafter   Encounter Date: 12/23/2020   PT End of Session - 12/23/20 1231     Visit Number [id number]    Number of Visits 144    Date for PT Re-Evaluation 03/03/21    Authorization Type 3/10 PN 12/09/20    Authorization Time Period 06/28/20-09/20/20    PT Start Time 1100    PT Stop Time 1145    PT Time Calculation (min) 45 min    Equipment Utilized During Treatment Gait belt    Activity Tolerance Patient tolerated treatment well;No increased pain    Behavior During Therapy WFL for tasks assessed/performed             Past Medical History:  Diagnosis Date   Complication of anesthesia    ? seizures after anesthesia    Headache    Migraines    Neurogenic bladder    Renal disorder    Vision abnormalities     Past Surgical History:  Procedure Laterality Date   ANTERIOR CRUCIATE LIGAMENT REPAIR  1997   APPENDECTOMY     COLONOSCOPY WITH PROPOFOL N/A 11/11/2018   Procedure: COLONOSCOPY WITH PROPOFOL;  Surgeon: [name], MD;  Location: [facility] ENDOSCOPY;  Service: Endoscopy;  Laterality: N/A;   CYSTOSCOPY WITH STENT PLACEMENT Right 04/17/2016   Procedure: CYSTOSCOPY WITH STENT PLACEMENT;  Surgeon: [name], MD;  Location: [facility] ORS;  Service: Urology;  Laterality: Right;   ESOPHAGOGASTRODUODENOSCOPY (EGD) WITH PROPOFOL N/A 11/11/2018   Procedure: ESOPHAGOGASTRODUODENOSCOPY (EGD) WITH PROPOFOL;  Surgeon: [name], MD;  Location: [facility] ENDOSCOPY;  Service: Endoscopy;  Laterality: N/A;   EXPLORATORY LAPAROTOMY  1999   KIDNEY STONE SURGERY  04/2016   REVISION UROSTOMY CUTANEOUS     REVISION UROSTOMY CUTANEOUS  01/10/2018   SUPRAPUBIC CATHETER PLACEMENT  08/2017    TONSILLECTOMY      There were no vitals filed for this visit.   Subjective Assessment - 12/23/20 1228     Subjective Patient went to gynocologist prior to PT appointment. Is having difficult side affects from UTI.    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 Due to the rarity of her disorder it took years prior to diagnosis. 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  Patient has good days and bad days. Her good days allow for short ambulation (within room only not between rooms) and her bad days she is unable to ambulate.    Limitations Lifting;Standing;Walking;House hold activities;Other (comment)    Patient Stated Goals to regain PLOF    Currently in Pain? Yes    Pain Score 5     Pain Location Back    Pain Orientation Lower    Pain Descriptors / Indicators Aching    Pain Onset 1 to 4 weeks ago    Pain Frequency Constant                   TherEx Bridge 10x with feet stabilized.  BTB abduction 15x  BTB march 15x each LE  3lb ankle weights:  -march 12x each LE -heel slide 12x each Les; modified for decreased pain -abduction/adduction 10x each LE   supine to sit with Min a for LE's  Ambulate with rollator and close CGA. Min A for rollator and cues for stepping. Excessive hyperextension of bilateral knees noted with ambulation with extreme ataxia.      Manual: Supine: Single knee to chest hold 30 seconds each LE Figure four piriformis stretch 30 seconds each LE   Hamstring lengthening 60 seconds each LE with progressive range with prolonged hold.  LE rotation 60 seconds x 3 trials for low back pain reduction Sciatic nerve glide with leg on PT shoulder 30 seconds  each LE        Pt educated throughout session about proper posture and technique with exercises. Improved exercise technique, movement at target joints, use of target muscles after min to mod verbal, visual, tactile cues    Patient is very motivated despite her symptoms of her UTI. She is able to ambulate a short duration this session without LE's buckling however does have frequent have hyperextension. Patient's tremors improved by end of session. Patient will continue to benefit from skilled physical therapy to increase strength, functional mobility and activity tolerance for optimal functional independence in home and community and improved quality of life.                   PT Education - 12/23/20 1230     Education Details exercise technique, body mechanics    Person(s) Educated Patient    Methods Explanation;Demonstration;Tactile cues;Verbal cues    Comprehension Verbalized understanding;Returned demonstration;Verbal cues required;Tactile cues required              PT Short Term Goals - 12/09/20 1201       PT SHORT TERM GOAL #1   Title Patient will demonstrate coordinated diaphragmatic breathing with pelvic tilts to demonstrate improved control of diaphragm and TA, to allow for further strengthening of core musculature and decreased pelvic floor spasm.    Baseline Pt. demonstrates breathing dysfunction and poor PFM coordination evidenced by anal manometry As of 2/22: Pt. continues to have restriction in her diaphragm and near T/L junction but is able to intentionally use diaphragmatic breathing through the available ROM.    Time 5    Period Weeks    Status Achieved    Target Date 04/20/19      PT SHORT TERM GOAL #2   Title Patient will report a reduction in pain to no greater than 6/10 over the prior week to demonstrate symptom improvement.    Baseline Pain is 10/10 at worst, 2/10 at best As of 2/22: Pt. had achieved this goal for 1 week as of 1/21 but may  have infection or other insult that caused pain to increase again. As of 4/29: Pain high of 8/10 but has been over-doing her activity over the past week. As of 6/10: Pt. was able to keep pain at 6 or below since prior visit by using TENS to help keep tension lower but has not had a full week to test if it will keep working. As of 7/6: Pt. had met this goal but has a new UTI and it caused pain to spike to 8/10 over the weekend. 8/25: 6/10 due to back pain    Time 5    Period Weeks    Status Achieved    Target Date 01/13/21      PT SHORT TERM GOAL #  3   Title Patient will demonstrate HEP x1 in the clinic to demonstrate understanding and proper form to allow for further improvement.    Baseline Pt. lacks knowledge of therepeutic exercises that can decrease her pain/Sx.    Time 5    Period Weeks    Status Achieved    Target Date 04/20/19      PT SHORT TERM GOAL #4   Title Patient will report consistent use of foot-stool (squatty-potty) for positioning with BM to decrease pain with BM and intra-abdominal pressure.    Baseline Pt. having constipation due to PFM dysfunction    Time 5    Period Weeks    Status Achieved    Target Date 04/20/19               PT Long Term Goals - 12/09/20 0001       PT LONG TERM GOAL #1   Title Pt. will be able to participate in regular ADL's with least restrictive device without pain increasing greater than 2/10    Baseline Pt. limited in her ability to perform household duties by increased pain and fatigue. As of 4/29: Her legs have more endurance with walking before she gets a tremor. Still needs to sit down the majority of the time she is cooking. Still needs back support for prolonged sitting. As of 6/10: Pt. able to do for  ~1 hour before she needs to rest to prevent increased pain, pain is averaging ~ 4-5/10 3/14: able to perform ADLs with less pain, does still have bad days 4/25: requires stops throughout process more fatigue than pain limiting 6/2: vas  of 7/10 in pelvis occasionally 8/25: able to perform ADLs without >2/10 pain increase    Time 12    Period Weeks    Status Achieved      PT LONG TERM GOAL #2   Title Patient will increase 10 meter walk test to <30 seconds as to improve gait speed for better community ambulation and to reduce fall risk.    Baseline 6/2: 1 min 18 seconds with rollator 8/25: deferred due to excessive knee buckling during TUG    Time 12    Period Weeks    Status On-going    Target Date 03/03/21      PT LONG TERM GOAL #3   Title Patient (< 47 years old) will complete five times sit to stand test in < 20 seconds indicating an increased LE strength and improved balance.    Baseline 6/2: 43 seconds with heavy BUE support 8/25: 40 seconds with SUE support    Time 12    Period Weeks    Status Partially Met    Target Date 03/03/21      PT LONG TERM GOAL #4   Title Pt. will be able to perform 1 hr. of light housework reqiring intermittent standing and walking without increased pain/fatigue >2 pts.    Baseline Pt. becomes fatigued washing fruits/vegetbles in small stages and has fatigue>3 pts. 3/14: able to perform but needs rest breaks 4/25: able to do but is fatiguing 6/2: able to perform on good days; unable to perform on bad days 8/25: 4-5/10    Time 12    Period Weeks    Status Partially Met    Target Date 03/03/21      PT LONG TERM GOAL #5   Title Patient will reduce timed up and go to <30 seconds to reduce fall risk and demonstrate improved transfer/gait ability.  Baseline 8/25: 1 min 47 seconds with rollator    Time 12    Period Weeks    Status New    Target Date 03/03/21      PT LONG TERM GOAL #6   Title Pt. will be able to attend a full athletic event for her children without Sx increasing by more than 2 pts.    Baseline Pt. having migraine or severe bladder spasms and fatigue and back pain> 3 pt. increase with attendance at athletic events 3/14: has increased pain but is able to attend now, has  to use TENS unit 4/25: has not had any games to be able to attempt to go to 6/2: no games due to summer time 8/25: able to attend one event    Time 12    Period Weeks    Status Achieved      PT LONG TERM GOAL #7   Title Patient will attend two consecutive athletic events for her children indicating improved capacity for activities and quality of life.    Baseline 8/25: can only attend one at a time    Time 12    Period Weeks    Status New    Target Date 03/03/21                   Plan - 12/23/20 1240     Clinical Impression Statement Patient is very motivated despite her symptoms of her UTI. She is able to ambulate a short duration this session without LE's buckling however does have frequent have hyperextension. Patient's tremors improved by end of session. Patient will continue to benefit from skilled physical therapy to increase strength, functional mobility and activity tolerance for optimal functional independence in home and community and improved quality of life.    Personal Factors and Comorbidities Comorbidity 3+;Transportation;Time since onset of injury/illness/exacerbation;Finances    Comorbidities neurologic insult with pending diagnosis, multiple recurrent UTI's, BLE weakness, depression, hereditary spastic paraplegia    Examination-Activity Limitations Caring for Others;Carry;Continence;Squat;Stairs;Toileting;Locomotion Level;Lift;Stand;Bed Mobility;Bend;Reach Overhead;Sit;Dressing;Transfers    Examination-Participation Restrictions Church;Cleaning;Community Activity;Driving;Interpersonal Relationship;Laundry;[employer] Work;Meal Prep;School;Volunteer    Stability/Clinical Decision Making Unstable/Unpredictable    Rehab Potential Fair    PT Frequency 2x / week    PT Duration 12 weeks    PT Treatment/Interventions ADLs/Self Care Home Management;Aquatic Therapy;Biofeedback;Moist Heat;Electrical Stimulation;Cryotherapy;Traction;Ultrasound;Therapeutic activities;Functional  mobility training;Stair training;Gait training;Therapeutic exercise;Balance training;Neuromuscular re-education;Patient/family education;Manual techniques;Dry needling;Scar mobilization;Energy conservation;Taping;Joint Manipulations;Spinal Manipulations;Visual/perceptual remediation/compensation;Passive range of motion;Wheelchair mobility training;DME Instruction;Orthotic Fit/Training    PT Next Visit Plan transfers, stabilization in standing, core activation    PT Home Exercise Plan no changes    Consulted and Agree with Plan of Care Patient             Patient will benefit from skilled therapeutic intervention in order to improve the following deficits and impairments:  Abnormal gait, Decreased balance, Decreased endurance, Decreased mobility, Difficulty walking, Increased muscle spasms, Impaired sensation, Improper body mechanics, Impaired tone, Decreased activity tolerance, Decreased coordination, Decreased strength, Postural dysfunction, Pain, Impaired flexibility  Visit Diagnosis: Muscle weakness (generalized)  Abnormality of gait and mobility  Other abnormalities of gait and mobility     Problem List Patient Active Problem List   Diagnosis Date Noted   Seizure ([place]) 11/11/2018   Major depressive disorder, recurrent episode, moderate ([facility]) 02/07/2018   Nephrolithiasis 04/16/2016   Numbness 07/28/2015   Bladder retention 06/23/2015   Abdominal pain 06/04/2015   Dizziness 05/18/2015   Neck pain 24/40/1027   Complicated migraine 25/36/6440   Other fatigue  04/28/2015   Abnormal finding on MRI of brain 04/28/2015   D (diarrhea) 03/29/2015   H/O disease 03/29/2015   Abnormal weight loss 03/29/2015   Muscle weakness (generalized) 03/14/2015   Headache, migraine 03/10/2015    Janna Arch, PT, DPT  12/23/2020, 12:41 PM  [place] [facility] SERVICES [address] [place], [place], [address] Phone: [phone]   Fax:   [phone]  Name: Christine Chavez MRN: [id number] Date of Birth: [birth year]

## 2020-12-23 NOTE — Progress Notes (Signed)
Obstetrics & Gynecology Office Visit   Chief Complaint  Patient presents with   Menstrual Problem    Vaginal d/c started as brown d/c progressed to some days of heavy bleeding for 1.5 wks about 1 wk after she had her period. Has urostomy bag. On Abx for UTI. Started having YI symptoms 2 days ago. Not on treatment for YI.    History of Present Illness: 40 y.o. G64P3003 female who presents for abnormal vaginal bleeding.  She has been seeing Antony Contras at [facility] at [place].  She notes that she is being treated currently for a UTI.  She is having yeast infection symptoms.  She has been having regular periods each month.  Normally her periods last about 5 days.  She normally passes clots.  Her periods are quite crampy.  Her husband has had a vasectomy.    She had a bladder infection late July/early August. She was treated with macrobid.   She started a period on 8/7 that lasted a full week (normally 5 days).  Then she started having bleeding on 8/21.  This bleeding was initially thick, brown discharge.   Then, after a couple of days, it turned into a pink discharge, then red blood that wasn't heavy.  This lasted about 1.5 weeks total. Intermittenly, there was a lot of discharge. She didn't have blood during that whole time.  She was having low pelvic pain. About two days ago she started having burning.  Now she has the white, clumpy discharge.  She recently started taking fosfomycin, she was given a 3-dose regimen that started on 9/3.  She has not treated the yeast infection.  In the past, she has tried taking Diflucan, two doses.  Usually, Diflucan works with only one dose.  She has been taking probiotics and takes this normally. She has doubled-up on the dosing of probiotics.  She was told by her Urogynecologist to see a regular gynecologist about her abnormal bleeding.  She had a history of very dark bleeding with "play-doh" consistency.  This resolved without intervention.    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    Last pap smear 12/25/2018: ASCUS, HPV neg  Past Medical History:  Diagnosis Date   Complication of anesthesia    ? seizures after anesthesia    Headache    Migraines    Neurogenic bladder    Renal disorder    Vision abnormalities     Past Surgical History:  Procedure Laterality Date   ANTERIOR CRUCIATE LIGAMENT REPAIR  1997   APPENDECTOMY     COLONOSCOPY WITH PROPOFOL N/A 11/11/2018   Procedure: COLONOSCOPY WITH PROPOFOL;  Surgeon: Christena Deem, MD;  Location: [facility] ENDOSCOPY;  Service: Endoscopy;  Laterality: N/A;   CYSTOSCOPY WITH STENT PLACEMENT Right 04/17/2016   Procedure: CYSTOSCOPY WITH STENT PLACEMENT;  Surgeon: Malen Gauze, MD;  Location: [facility] ORS;  Service: Urology;  Laterality: Right;   ESOPHAGOGASTRODUODENOSCOPY (EGD) WITH PROPOFOL N/A 11/11/2018   Procedure: ESOPHAGOGASTRODUODENOSCOPY (EGD) WITH PROPOFOL;  Surgeon: Christena Deem, MD;  Location: [facility] ENDOSCOPY;  Service: Endoscopy;  Laterality: N/A;   EXPLORATORY LAPAROTOMY  1999   KIDNEY STONE SURGERY  04/2016   REVISION UROSTOMY CUTANEOUS     REVISION UROSTOMY CUTANEOUS  01/10/2018   SUPRAPUBIC CATHETER PLACEMENT  08/2017   TONSILLECTOMY      Gynecologic History: Patient's last menstrual period was 11/21/2020.  Obstetric History: [id number]  Family History  Problem Relation Age of Onset  Hypertension Mother    Atrial fibrillation Father    Healthy Brother    Depression Brother    Arthritis/Rheumatoid Paternal Grandmother    Healthy Brother     Social History   Socioeconomic History   Marital status: Married    Spouse name: Not on file   Number of children: Not on file   Years of education: Not on file   Highest education level: Not on file  Occupational History   Not on file  Tobacco Use   Smoking status: Never   Smokeless tobacco: Never  Vaping Use   Vaping Use: Never used  Substance and Sexual Activity   Alcohol use:  No    Alcohol/week: 0.0 standard drinks   Drug use: No   Sexual activity: Yes    Birth control/protection: None    Comment: vasectomy  Other Topics Concern   Not on file  Social History Narrative   Not on file   Social Determinants of Health   Financial Resource Strain: Not on file  Food Insecurity: Not on file  Transportation Needs: Not on file  Physical Activity: Not on file  Stress: Not on file  Social Connections: Not on file  Intimate Partner Violence: Not on file    Allergies  Allergen Reactions   Aspirin Shortness Of Breath, Swelling and Anaphylaxis   Ibuprofen Shortness Of Breath, Swelling and Anaphylaxis   Vibegron Anaphylaxis   Morphine And Related Hives   Methenamine Hippurate Rash    Prior to Admission medications   Medication Sig Start Date End Date Taking? Authorizing Provider  B Complex Vitamins (VITAMIN B COMPLEX 100) INJ Inject as directed.   Yes [provider]  belladonna-opium (B&O SUPPRETTES) 16.2-30 MG suppository Place 30 mg rectally every 8 (eight) hours as needed for pain.   Yes [provider]  Cyanocobalamin (VITAMIN B-12) 2500 MCG SUBL Take 2,500 mcg by mouth daily.   Yes [provider]  diazepam (VALIUM) 2 MG tablet Take 2 mg by mouth every 6 (six) hours as needed for anxiety or muscle spasms (Vaginal application for PFM spasms).   Yes [provider]  folic acid (FOLVITE) 1 MG tablet Take 1 mg by mouth daily.   Yes [provider]  LINZESS 72 MCG capsule TAKE 1 CAPSULE (72 MCG TOTAL) BY MOUTH ONCE DAILY 09/04/18  Yes [provider]  mirabegron ER (MYRBETRIQ) 50 MG TB24 tablet Take 50 mg by mouth daily.    Yes [provider]  mirtazapine (REMERON) 15 MG tablet Take 1 tablet (15 mg total) by mouth at bedtime. 07/04/19  Yes Corie Chiquito, PMHNP  ondansetron (ZOFRAN) 4 MG tablet Take 4 mg by mouth every 8 (eight) hours as needed for nausea or vomiting.   Yes [provider]   oxybutynin (DITROPAN) 5 MG tablet Take 5 mg by mouth every 8 (eight) hours as needed.    Yes [provider]  pantoprazole (PROTONIX) 40 MG tablet Take 40 mg by mouth 2 (two) times daily.  08/26/18  Yes [provider]  pregabalin (LYRICA) 75 MG capsule Takes 2 caps po q am and 2 caps po QHS 02/14/19  Yes [provider]  promethazine (PHENERGAN) 12.5 MG tablet Take 1 tablet (12.5 mg total) by mouth every 4 (four) hours as needed for nausea or vomiting. 01/19/17  Yes Hildred Laser, MD  rizatriptan (MAXALT) 5 MG tablet Take 5 mg by mouth as needed.  09/21/16  Yes [provider]  baclofen (LIORESAL)  10 MG tablet Take 10 mg by mouth 3 (three) times daily. 06/17/19   [provider]  vortioxetine HBr (TRINTELLIX) 20 MG TABS tablet Take 1 tablet (20 mg total) by mouth daily. Patient not taking: Reported on 12/23/2020 08/27/19   Corie Chiquito, PMHNP    Review of Systems  Constitutional: Negative.   HENT: Negative.    Eyes: Negative.   Respiratory: Negative.    Cardiovascular: Negative.   Gastrointestinal: Negative.   Genitourinary: Negative.        See HPI  Musculoskeletal: Negative.   Skin: Negative.   Neurological: Negative.   Psychiatric/Behavioral: Negative.      Physical Exam BP 115/81 (Cuff Size: Normal)   Pulse 79   Ht 5\' 7"  (1.702 m)   Wt 142 lb (64.4 kg)   LMP 11/21/2020   BMI 22.24 kg/m  Patient's last menstrual period was 11/21/2020. Physical Exam Constitutional:      General: She is not in acute distress.    Appearance: Normal appearance. She is well-developed.  Genitourinary:     Vulva, bladder and urethral meatus normal.     No lesions in the vagina.     Right Labia: No rash, tenderness, lesions, skin changes or Bartholin's cyst.    Left Labia: No tenderness, lesions, skin changes, Bartholin's cyst or rash.    No inguinal adenopathy present in the right or left side.    Pelvic Tanner Score: 5/5.    No vaginal erythema,  tenderness or bleeding.      Right Adnexa: not tender, not full and no mass present.    Left Adnexa: not tender, not full and no mass present.    No cervical motion tenderness, friability, lesion or polyp.     Uterus is not enlarged, fixed or tender.     Uterus is anteverted.     No urethral tenderness or mass present.     Pelvic exam was performed with patient in the lithotomy position.  HENT:     Head: Normocephalic and atraumatic.  Eyes:     General: No scleral icterus.    Conjunctiva/sclera: Conjunctivae normal.  Cardiovascular:     Rate and Rhythm: Normal rate and regular rhythm.     Heart sounds: No murmur heard.   No friction rub. No gallop.  Pulmonary:     Effort: Pulmonary effort is normal. No respiratory distress.     Breath sounds: Normal breath sounds. No wheezing or rales.  Abdominal:     General: Bowel sounds are normal. There is no distension.     Palpations: Abdomen is soft. There is no mass.     Tenderness: There is no abdominal tenderness. There is no guarding or rebound.     Hernia: There is no hernia in the left inguinal area or right inguinal area.     Comments: Urostomy bag noted  Musculoskeletal:        General: Normal range of motion.     Cervical back: Normal range of motion and neck supple.  Lymphadenopathy:     Lower Body: No right inguinal adenopathy. No left inguinal adenopathy.  Neurological:     General: No focal deficit present.     Mental Status: She is alert and oriented to person, place, and time.     Cranial Nerves: No cranial nerve deficit.  Skin:    General: Skin is warm and dry.     Findings: No erythema.  Psychiatric:        Mood and Affect: Mood  normal.        Behavior: Behavior normal.        Judgment: Judgment normal.   Female chaperone present for pelvic and breast  portions of the physical exam  Wet Prep: PH: not done Clue Cells: Negative Fungal elements: Positive Trichomonas: Negative   Assessment: 40 y.o. 643P3003  female here for  1. Menorrhagia with irregular cycle   2. Vulvovaginal candidiasis   3. Acute vaginitis   4. Atypical squamous cells of undetermined significance (ASCUS) on Papanicolaou smear of cervix      Plan: Problem List Items Addressed This Visit   None Visit Diagnoses     Menorrhagia with irregular cycle    -  Primary   Relevant Orders   NuSwab Vaginitis Plus (VG+)   US PELVIC COMPLETE WITH TRANSVAGINAL   Vulvovaginal candidiasis       Relevant Medications   fosfomycin (MONUROL) 3 g PACK   fluconazole (DIFLUCAN) 150 MG tablet   Acute vaginitis       Relevant Orders   NuSwab Vaginitis Plus (VG+)   Atypical squamous cells of undetermined significance (ASCUS) on Papanicolaou smear of cervix       Relevant Orders   Cytology - PAP      Will treat for vulvovaginal candidiasis with 1 extra dose in case the first 1 does not alleviate symptoms, especially since she continues to take antibiotics. A Pap smear along with a vaginitis test performed, given her recent symptoms. Her most recent irregular bleeding could be related to other factors.  This is something that she would like to watch for now.  However, a pelvic ultrasound was ordered and will be performed if her irregular bleeding continues.  Otherwise, we will continue to monitor and help as needed.  A total of 32 minutes were spent face-to-face with the patient as well as preparation, review, communication, and documentation during this encounter.    Thomasene MohairStephen Beya Tipps, MD 12/23/2020 10:10 AM

## 2020-12-27 ENCOUNTER — Other Ambulatory Visit: Payer: Self-pay

## 2020-12-27 ENCOUNTER — Ambulatory Visit: Payer: BC Managed Care – PPO

## 2020-12-27 DIAGNOSIS — R2689 Other abnormalities of gait and mobility: Secondary | ICD-10-CM

## 2020-12-27 DIAGNOSIS — M6281 Muscle weakness (generalized): Secondary | ICD-10-CM

## 2020-12-27 LAB — NUSWAB VAGINITIS PLUS (VG+)
Candida albicans, NAA: POSITIVE — AB
Candida glabrata, NAA: NEGATIVE
Chlamydia trachomatis, NAA: NEGATIVE
Neisseria gonorrhoeae, NAA: NEGATIVE
Trich vag by NAA: NEGATIVE

## 2020-12-27 NOTE — Therapy (Signed)
[place] MAIN [facility] SERVICES [address] [place], [place], [address] Phone: [phone]   Fax:  [phone]  Physical Therapy Treatment  Patient Details  Name: Christine Chavez MRN: [id number] Date of Birth: 03/19/1981 Referring Provider (PT): Elza Rafter   Encounter Date: 12/27/2020   PT End of Session - 12/27/20 1106     Visit Number [id number]    Number of Visits 144    Date for PT Re-Evaluation 03/03/21    Authorization Type 4/10 PN 12/09/20    Authorization Time Period 06/28/20-09/20/20    PT Start Time 1015    PT Stop Time 1058    PT Time Calculation (min) 43 min    Equipment Utilized During Treatment Gait belt    Activity Tolerance Patient tolerated treatment well;No increased pain    Behavior During Therapy WFL for tasks assessed/performed             Past Medical History:  Diagnosis Date   Complication of anesthesia    ? seizures after anesthesia    Headache    Migraines    Neurogenic bladder    Renal disorder    Vision abnormalities     Past Surgical History:  Procedure Laterality Date   ANTERIOR CRUCIATE LIGAMENT REPAIR  1997   APPENDECTOMY     COLONOSCOPY WITH PROPOFOL N/A 11/11/2018   Procedure: COLONOSCOPY WITH PROPOFOL;  Surgeon: [name], MD;  Location: [facility] ENDOSCOPY;  Service: Endoscopy;  Laterality: N/A;   CYSTOSCOPY WITH STENT PLACEMENT Right 04/17/2016   Procedure: CYSTOSCOPY WITH STENT PLACEMENT;  Surgeon: [name], MD;  Location: [facility] ORS;  Service: Urology;  Laterality: Right;   ESOPHAGOGASTRODUODENOSCOPY (EGD) WITH PROPOFOL N/A 11/11/2018   Procedure: ESOPHAGOGASTRODUODENOSCOPY (EGD) WITH PROPOFOL;  Surgeon: [name], MD;  Location: [facility] ENDOSCOPY;  Service: Endoscopy;  Laterality: N/A;   EXPLORATORY LAPAROTOMY  1999   KIDNEY STONE SURGERY  04/2016   REVISION UROSTOMY CUTANEOUS     REVISION UROSTOMY CUTANEOUS  01/10/2018   SUPRAPUBIC CATHETER PLACEMENT  08/2017    TONSILLECTOMY      There were no vitals filed for this visit.   Subjective Assessment - 12/27/20 1104     Subjective Patient went to ortho physician since last session. Is going to have additional imaging to assess her hips/pelvis. Reports the PA thinks she has impingement as well as potentially some other condition but patient is not sure of what.    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 Due to the rarity of her disorder it took years prior to diagnosis. 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  Patient has good days and bad days. Her good days allow for short ambulation (within room only not between rooms) and her bad days she is unable to ambulate.    Limitations Lifting;Standing;Walking;House hold activities;Other (comment)    Patient Stated Goals to regain PLOF    Currently in Pain? Yes    Pain Score 5     Pain Location Back    Pain Orientation Lower    Pain Descriptors / Indicators Aching    Pain Type Neuropathic pain;Chronic pain    Pain Onset 1 to 4 weeks ago    Pain Frequency Constant  TherEx Bridge 10x with feet stabilized.  BTB abduction 15x  BTB march 15x each LE Contract relax against PT resistance 12x each LE, increased tremors with contraction   sidelying clamshells 12x each side Reverse clamshells sidelying 15x each side; very fatiguing to patient    supine to sit with Min a for LE's Seated: RTB hamstring curl 12x each LE RTB toe taps 60 seconds; fatiguing to patient.      Manual: Supine: Single knee to chest hold 30 seconds each LE Figure four piriformis stretch 30 seconds each LE   Hamstring lengthening 60 seconds each LE  with progressive range with prolonged hold.  LE rotation 60 seconds x 3 trials for low back pain reduction Sciatic nerve glide with leg on PT shoulder 30 seconds each LE        Pt educated throughout session about proper posture and technique with exercises. Improved exercise technique, movement at target joints, use of target muscles after min to mod verbal, visual, tactile cues  Patient has increased episodes of LE trembling and spasm noted throughout session. Continued symptoms of UTI noted with patient aware of need to follow up with physician. Patient remains highly motivated despite full body fatigue from infection. Patient will continue to benefit from skilled physical therapy to increase strength, functional mobility and activity tolerance for optimal functional independence in home and community and improved quality of life                 PT Education - 12/27/20 1105     Education Details exercise technique, body mechanics    Person(s) Educated Patient    Methods Explanation;Demonstration;Tactile cues;Verbal cues    Comprehension Verbalized understanding;Returned demonstration;Verbal cues required;Tactile cues required              PT Short Term Goals - 12/09/20 1201       PT SHORT TERM GOAL #1   Title Patient will demonstrate coordinated diaphragmatic breathing with pelvic tilts to demonstrate improved control of diaphragm and TA, to allow for further strengthening of core musculature and decreased pelvic floor spasm.    Baseline Pt. demonstrates breathing dysfunction and poor PFM coordination evidenced by anal manometry As of 2/22: Pt. continues to have restriction in her diaphragm and near T/L junction but is able to intentionally use diaphragmatic breathing through the available ROM.    Time 5    Period Weeks    Status Achieved    Target Date 04/20/19      PT SHORT TERM GOAL #2   Title Patient will report a reduction in pain to no greater than 6/10 over  the prior week to demonstrate symptom improvement.    Baseline Pain is 10/10 at worst, 2/10 at best As of 2/22: Pt. had achieved this goal for 1 week as of 1/21 but may have infection or other insult that caused pain to increase again. As of 4/29: Pain high of 8/10 but has been over-doing her activity over the past week. As of 6/10: Pt. was able to keep pain at 6 or below since prior visit by using TENS to help keep tension lower but has not had a full week to test if it will keep working. As of 7/6: Pt. had met this goal but has a new UTI and it caused pain to spike to 8/10 over the weekend. 8/25: 6/10 due to back pain    Time 5    Period Weeks    Status Achieved    Target  Date 01/13/21      PT SHORT TERM GOAL #3   Title Patient will demonstrate HEP x1 in the clinic to demonstrate understanding and proper form to allow for further improvement.    Baseline Pt. lacks knowledge of therepeutic exercises that can decrease her pain/Sx.    Time 5    Period Weeks    Status Achieved    Target Date 04/20/19      PT SHORT TERM GOAL #4   Title Patient will report consistent use of foot-stool (squatty-potty) for positioning with BM to decrease pain with BM and intra-abdominal pressure.    Baseline Pt. having constipation due to PFM dysfunction    Time 5    Period Weeks    Status Achieved    Target Date 04/20/19               PT Long Term Goals - 12/09/20 0001       PT LONG TERM GOAL #1   Title Pt. will be able to participate in regular ADL's with least restrictive device without pain increasing greater than 2/10    Baseline Pt. limited in her ability to perform household duties by increased pain and fatigue. As of 4/29: Her legs have more endurance with walking before she gets a tremor. Still needs to sit down the majority of the time she is cooking. Still needs back support for prolonged sitting. As of 6/10: Pt. able to do for  ~1 hour before she needs to rest to prevent increased pain, pain  is averaging ~ 4-5/10 3/14: able to perform ADLs with less pain, does still have bad days 4/25: requires stops throughout process more fatigue than pain limiting 6/2: vas of 7/10 in pelvis occasionally 8/25: able to perform ADLs without >2/10 pain increase    Time 12    Period Weeks    Status Achieved      PT LONG TERM GOAL #2   Title Patient will increase 10 meter walk test to <30 seconds as to improve gait speed for better community ambulation and to reduce fall risk.    Baseline 6/2: 1 min 18 seconds with rollator 8/25: deferred due to excessive knee buckling during TUG    Time 12    Period Weeks    Status On-going    Target Date 03/03/21      PT LONG TERM GOAL #3   Title Patient (< 39 years old) will complete five times sit to stand test in < 20 seconds indicating an increased LE strength and improved balance.    Baseline 6/2: 43 seconds with heavy BUE support 8/25: 40 seconds with SUE support    Time 12    Period Weeks    Status Partially Met    Target Date 03/03/21      PT LONG TERM GOAL #4   Title Pt. will be able to perform 1 hr. of light housework reqiring intermittent standing and walking without increased pain/fatigue >2 pts.    Baseline Pt. becomes fatigued washing fruits/vegetbles in small stages and has fatigue>3 pts. 3/14: able to perform but needs rest breaks 4/25: able to do but is fatiguing 6/2: able to perform on good days; unable to perform on bad days 8/25: 4-5/10    Time 12    Period Weeks    Status Partially Met    Target Date 03/03/21      PT LONG TERM GOAL #5   Title Patient will reduce timed up and go to <  30 seconds to reduce fall risk and demonstrate improved transfer/gait ability.    Baseline 8/25: 1 min 47 seconds with rollator    Time 12    Period Weeks    Status New    Target Date 03/03/21      PT LONG TERM GOAL #6   Title Pt. will be able to attend a full athletic event for her children without Sx increasing by more than 2 pts.    Baseline Pt.  having migraine or severe bladder spasms and fatigue and back pain> 3 pt. increase with attendance at athletic events 3/14: has increased pain but is able to attend now, has to use TENS unit 4/25: has not had any games to be able to attempt to go to 6/2: no games due to summer time 8/25: able to attend one event    Time 12    Period Weeks    Status Achieved      PT LONG TERM GOAL #7   Title Patient will attend two consecutive athletic events for her children indicating improved capacity for activities and quality of life.    Baseline 8/25: can only attend one at a time    Time 12    Period Weeks    Status New    Target Date 03/03/21                   Plan - 12/27/20 1106     Clinical Impression Statement Patient has increased episodes of LE trembling and spasm noted throughout session. Continued symptoms of UTI noted with patient aware of need to follow up with physician. Patient remains highly motivated despite full body fatigue from infection. Patient will continue to benefit from skilled physical therapy to increase strength, functional mobility and activity tolerance for optimal functional independence in home and community and improved quality of life    Personal Factors and Comorbidities Comorbidity 3+;Transportation;Time since onset of injury/illness/exacerbation;Finances    Comorbidities neurologic insult with pending diagnosis, multiple recurrent UTI's, BLE weakness, depression, hereditary spastic paraplegia    Examination-Activity Limitations Caring for Others;Carry;Continence;Squat;Stairs;Toileting;Locomotion Level;Lift;Stand;Bed Mobility;Bend;Reach Overhead;Sit;Dressing;Transfers    Examination-Participation Restrictions Church;Cleaning;Community Activity;Driving;Interpersonal Relationship;Laundry;[employer] Work;Meal Prep;School;Volunteer    Stability/Clinical Decision Making Unstable/Unpredictable    Rehab Potential Fair    PT Frequency 2x / week    PT Duration 12 weeks     PT Treatment/Interventions ADLs/Self Care Home Management;Aquatic Therapy;Biofeedback;Moist Heat;Electrical Stimulation;Cryotherapy;Traction;Ultrasound;Therapeutic activities;Functional mobility training;Stair training;Gait training;Therapeutic exercise;Balance training;Neuromuscular re-education;Patient/family education;Manual techniques;Dry needling;Scar mobilization;Energy conservation;Taping;Joint Manipulations;Spinal Manipulations;Visual/perceptual remediation/compensation;Passive range of motion;Wheelchair mobility training;DME Instruction;Orthotic Fit/Training    PT Next Visit Plan transfers, stabilization in standing, core activation    PT Home Exercise Plan no changes    Consulted and Agree with Plan of Care Patient             Patient will benefit from skilled therapeutic intervention in order to improve the following deficits and impairments:  Abnormal gait, Decreased balance, Decreased endurance, Decreased mobility, Difficulty walking, Increased muscle spasms, Impaired sensation, Improper body mechanics, Impaired tone, Decreased activity tolerance, Decreased coordination, Decreased strength, Postural dysfunction, Pain, Impaired flexibility  Visit Diagnosis: Muscle weakness (generalized)  Other abnormalities of gait and mobility     Problem List Patient Active Problem List   Diagnosis Date Noted   Seizure ([place]) 11/11/2018   Major depressive disorder, recurrent episode, moderate ([place]) 02/07/2018   Nephrolithiasis 04/16/2016   Numbness 07/28/2015   Bladder retention 06/23/2015   Abdominal pain 06/04/2015   Dizziness 05/18/2015   Neck pain 05/18/2015  Complicated migraine 77/41/2878   Other fatigue 04/28/2015   Abnormal finding on MRI of brain 04/28/2015   D (diarrhea) 03/29/2015   H/O disease 03/29/2015   Abnormal weight loss 03/29/2015   Muscle weakness (generalized) 03/14/2015   Headache, migraine 03/10/2015    Janna Arch, PT, DPT  12/27/2020, 11:07 AM  [place] MAIN [facility] SERVICES [address] [place], [place], [address] Phone: [phone]   Fax:  [phone]  Name: Nahima Ales MRN: [id number] Date of Birth: 09/01/1980

## 2020-12-29 ENCOUNTER — Ambulatory Visit
Admission: RE | Admit: 2020-12-29 | Discharge: 2020-12-29 | Disposition: A | Discharge status: Home / Self Care | Payer: BC Managed Care – PPO | Source: Ambulatory Visit | Attending: Obstetrics and Gynecology | Admitting: Obstetrics and Gynecology

## 2020-12-29 ENCOUNTER — Other Ambulatory Visit: Payer: Self-pay

## 2020-12-29 DIAGNOSIS — N921 Excessive and frequent menstruation with irregular cycle: Secondary | ICD-10-CM | POA: Diagnosis present

## 2020-12-29 LAB — CYTOLOGY - PAP
Comment: NEGATIVE
Diagnosis: NEGATIVE
Diagnosis: REACTIVE
High risk HPV: NEGATIVE

## 2020-12-30 ENCOUNTER — Ambulatory Visit: Payer: BC Managed Care – PPO | Admitting: Physical Therapy

## 2020-12-30 DIAGNOSIS — R2689 Other abnormalities of gait and mobility: Secondary | ICD-10-CM

## 2020-12-30 DIAGNOSIS — M6281 Muscle weakness (generalized): Secondary | ICD-10-CM | POA: Diagnosis not present

## 2020-12-30 DIAGNOSIS — R262 Difficulty in walking, not elsewhere classified: Secondary | ICD-10-CM

## 2020-12-30 DIAGNOSIS — M62838 Other muscle spasm: Secondary | ICD-10-CM

## 2020-12-30 DIAGNOSIS — R269 Unspecified abnormalities of gait and mobility: Secondary | ICD-10-CM

## 2020-12-30 NOTE — Therapy (Signed)
[facility] SERVICES [address] [place], [place], [address] Phone: [phone]   Fax:  [phone]  Physical Therapy Treatment  Patient Details  Name: Christine Chavez MRN: [id number] Date of Birth: July 25, 1980 Referring Provider (PT): Elza Rafter   Encounter Date: 12/30/2020   PT End of Session - 12/30/20 1158     Visit Number 125    Number of Visits 144    Date for PT Re-Evaluation 03/03/21    Authorization Type 4/10 PN 12/09/20    Authorization Time Period 06/28/20-09/20/20    PT Start Time 1101    PT Stop Time 1147    PT Time Calculation (min) 46 min    Equipment Utilized During Treatment Gait belt    Activity Tolerance Patient tolerated treatment well;No increased pain    Behavior During Therapy WFL for tasks assessed/performed             Past Medical History:  Diagnosis Date   Complication of anesthesia    ? seizures after anesthesia    Headache    Migraines    Neurogenic bladder    Renal disorder    Vision abnormalities     Past Surgical History:  Procedure Laterality Date   ANTERIOR CRUCIATE LIGAMENT REPAIR  1997   APPENDECTOMY     COLONOSCOPY WITH PROPOFOL N/A 11/11/2018   Procedure: COLONOSCOPY WITH PROPOFOL;  Surgeon: [name], MD;  Location: [facility] ENDOSCOPY;  Service: Endoscopy;  Laterality: N/A;   CYSTOSCOPY WITH STENT PLACEMENT Right 04/17/2016   Procedure: CYSTOSCOPY WITH STENT PLACEMENT;  Surgeon: [name], MD;  Location: [facility] ORS;  Service: Urology;  Laterality: Right;   ESOPHAGOGASTRODUODENOSCOPY (EGD) WITH PROPOFOL N/A 11/11/2018   Procedure: ESOPHAGOGASTRODUODENOSCOPY (EGD) WITH PROPOFOL;  Surgeon: [name], MD;  Location: [facility] ENDOSCOPY;  Service: Endoscopy;  Laterality: N/A;   EXPLORATORY LAPAROTOMY  1999   KIDNEY STONE SURGERY  04/2016   REVISION UROSTOMY CUTANEOUS     REVISION UROSTOMY CUTANEOUS  01/10/2018   SUPRAPUBIC CATHETER PLACEMENT  08/2017    TONSILLECTOMY      There were no vitals filed for this visit.   Subjective Assessment - 12/30/20 1156     Subjective Patient states she hasn't been great lately as she continues to experience bladder infections and pain. She has an MRI scheduled for next Tuesday for further assessment of hips/pelvis. She did state she had a severe spams in the R hip last night as she tried sitting up from the couch.    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 Due to the rarity of her disorder it took years prior to diagnosis. 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  Patient has good days and bad days. Her good days allow for short ambulation (within room only not between rooms) and her bad days she is unable to ambulate.    Limitations Lifting;Standing;Walking;House hold activities;Other (comment)    Patient Stated Goals to regain PLOF    Currently in Pain? Yes    Pain Score 5     Pain Location Back    Pain Orientation Lower    Pain Descriptors / Indicators Aching    Pain Onset 1 to 4 weeks ago  INTERVENTIONS  TherEx Bridge 10x with feet stabilized.  BTB abduction 15x  BTB march 15x each LE Sidelying clamshells 12x each side   Supine to sit with CGA Seated: RTB hamstring curl 12x each LE RTB toe taps 60 seconds; fatiguing to patient.      Manual: Supine: Single knee to chest hold 30 seconds each LE Figure four piriformis stretch 30 seconds each LE   Hamstring lengthening 60 seconds each LE with progressive range with prolonged hold.  LE rotation 60 seconds x 3 trials for low back pain reduction Sciatic nerve glide with leg on PT shoulder 30 seconds each LE        Pt  educated throughout session about proper posture and technique with exercises. Improved exercise technique, movement at target joints, use of target muscles after min to mod verbal, visual, tactile cues   Patient pleasant and highly motivated throughout session. Heat applied to low back for pain modulation. Therex continued from previous session; performed with good concentric and eccentric control. Occasional LE spasms and minimal tremors observed. Patient will continue to benefit from skilled physical therapy to increase strength, functional mobility and activity tolerance for optimal functional independence in home and community and improved quality of life         PT Short Term Goals - 12/09/20 1201       PT SHORT TERM GOAL #1   Title Patient will demonstrate coordinated diaphragmatic breathing with pelvic tilts to demonstrate improved control of diaphragm and TA, to allow for further strengthening of core musculature and decreased pelvic floor spasm.    Baseline Pt. demonstrates breathing dysfunction and poor PFM coordination evidenced by anal manometry As of 2/22: Pt. continues to have restriction in her diaphragm and near T/L junction but is able to intentionally use diaphragmatic breathing through the available ROM.    Time 5    Period Weeks    Status Achieved    Target Date 04/20/19      PT SHORT TERM GOAL #2   Title Patient will report a reduction in pain to no greater than 6/10 over the prior week to demonstrate symptom improvement.    Baseline Pain is 10/10 at worst, 2/10 at best As of 2/22: Pt. had achieved this goal for 1 week as of 1/21 but may have infection or other insult that caused pain to increase again. As of 4/29: Pain high of 8/10 but has been over-doing her activity over the past week. As of 6/10: Pt. was able to keep pain at 6 or below since prior visit by using TENS to help keep tension lower but has not had a full week to test if it will keep working. As of 7/6: Pt.  had met this goal but has a new UTI and it caused pain to spike to 8/10 over the weekend. 8/25: 6/10 due to back pain    Time 5    Period Weeks    Status Achieved    Target Date 01/13/21      PT SHORT TERM GOAL #3   Title Patient will demonstrate HEP x1 in the clinic to demonstrate understanding and proper form to allow for further improvement.    Baseline Pt. lacks knowledge of therepeutic exercises that can decrease her pain/Sx.    Time 5    Period Weeks    Status Achieved    Target Date 04/20/19      PT SHORT TERM GOAL #4   Title Patient will report consistent use  of foot-stool (squatty-potty) for positioning with BM to decrease pain with BM and intra-abdominal pressure.    Baseline Pt. having constipation due to PFM dysfunction    Time 5    Period Weeks    Status Achieved    Target Date 04/20/19               PT Long Term Goals - 12/09/20 0001       PT LONG TERM GOAL #1   Title Pt. will be able to participate in regular ADL's with least restrictive device without pain increasing greater than 2/10    Baseline Pt. limited in her ability to perform household duties by increased pain and fatigue. As of 4/29: Her legs have more endurance with walking before she gets a tremor. Still needs to sit down the majority of the time she is cooking. Still needs back support for prolonged sitting. As of 6/10: Pt. able to do for  ~1 hour before she needs to rest to prevent increased pain, pain is averaging ~ 4-5/10 3/14: able to perform ADLs with less pain, does still have bad days 4/25: requires stops throughout process more fatigue than pain limiting 6/2: vas of 7/10 in pelvis occasionally 8/25: able to perform ADLs without >2/10 pain increase    Time 12    Period Weeks    Status Achieved      PT LONG TERM GOAL #2   Title Patient will increase 10 meter walk test to <30 seconds as to improve gait speed for better community ambulation and to reduce fall risk.    Baseline 6/2: 1 min 18  seconds with rollator 8/25: deferred due to excessive knee buckling during TUG    Time 12    Period Weeks    Status On-going    Target Date 03/03/21      PT LONG TERM GOAL #3   Title Patient (< 1 years old) will complete five times sit to stand test in < 20 seconds indicating an increased LE strength and improved balance.    Baseline 6/2: 43 seconds with heavy BUE support 8/25: 40 seconds with SUE support    Time 12    Period Weeks    Status Partially Met    Target Date 03/03/21      PT LONG TERM GOAL #4   Title Pt. will be able to perform 1 hr. of light housework reqiring intermittent standing and walking without increased pain/fatigue >2 pts.    Baseline Pt. becomes fatigued washing fruits/vegetbles in small stages and has fatigue>3 pts. 3/14: able to perform but needs rest breaks 4/25: able to do but is fatiguing 6/2: able to perform on good days; unable to perform on bad days 8/25: 4-5/10    Time 12    Period Weeks    Status Partially Met    Target Date 03/03/21      PT LONG TERM GOAL #5   Title Patient will reduce timed up and go to <30 seconds to reduce fall risk and demonstrate improved transfer/gait ability.    Baseline 8/25: 1 min 47 seconds with rollator    Time 12    Period Weeks    Status New    Target Date 03/03/21      PT LONG TERM GOAL #6   Title Pt. will be able to attend a full athletic event for her children without Sx increasing by more than 2 pts.    Baseline Pt. having migraine or severe bladder spasms  and fatigue and back pain> 3 pt. increase with attendance at athletic events 3/14: has increased pain but is able to attend now, has to use TENS unit 4/25: has not had any games to be able to attempt to go to 6/2: no games due to summer time 8/25: able to attend one event    Time 12    Period Weeks    Status Achieved      PT LONG TERM GOAL #7   Title Patient will attend two consecutive athletic events for her children indicating improved capacity for  activities and quality of life.    Baseline 8/25: can only attend one at a time    Time 12    Period Weeks    Status New    Target Date 03/03/21                   Plan - 12/30/20 1159     Clinical Impression Statement Patient pleasant and highly motivated throughout session. Heat applied to low back for pain modulation. Therex continued from previous session; performed with good concentric and eccentric control. Occasional LE spasms and minimal tremors observed. Patient will continue to benefit from skilled physical therapy to increase strength, functional mobility and activity tolerance for optimal functional independence in home and community and improved quality of life    Personal Factors and Comorbidities Comorbidity 3+;Transportation;Time since onset of injury/illness/exacerbation;Finances    Comorbidities neurologic insult with pending diagnosis, multiple recurrent UTI's, BLE weakness, depression, hereditary spastic paraplegia    Examination-Activity Limitations Caring for Others;Carry;Continence;Squat;Stairs;Toileting;Locomotion Level;Lift;Stand;Bed Mobility;Bend;Reach Overhead;Sit;Dressing;Transfers    Examination-Participation Restrictions Church;Cleaning;Community Activity;Driving;Interpersonal Relationship;Laundry;[employer] Work;Meal Prep;School;Volunteer    Stability/Clinical Decision Making Unstable/Unpredictable    Rehab Potential Fair    PT Frequency 2x / week    PT Duration 12 weeks    PT Treatment/Interventions ADLs/Self Care Home Management;Aquatic Therapy;Biofeedback;Moist Heat;Electrical Stimulation;Cryotherapy;Traction;Ultrasound;Therapeutic activities;Functional mobility training;Stair training;Gait training;Therapeutic exercise;Balance training;Neuromuscular re-education;Patient/family education;Manual techniques;Dry needling;Scar mobilization;Energy conservation;Taping;Joint Manipulations;Spinal Manipulations;Visual/perceptual remediation/compensation;Passive range of  motion;Wheelchair mobility training;DME Instruction;Orthotic Fit/Training    PT Next Visit Plan transfers, stabilization in standing, core activation    PT Home Exercise Plan no changes    Consulted and Agree with Plan of Care Patient             Patient will benefit from skilled therapeutic intervention in order to improve the following deficits and impairments:  Abnormal gait, Decreased balance, Decreased endurance, Decreased mobility, Difficulty walking, Increased muscle spasms, Impaired sensation, Improper body mechanics, Impaired tone, Decreased activity tolerance, Decreased coordination, Decreased strength, Postural dysfunction, Pain, Impaired flexibility  Visit Diagnosis: Muscle weakness (generalized)  Other abnormalities of gait and mobility  Abnormality of gait and mobility  Difficulty in walking, not elsewhere classified  Other muscle spasm     Problem List Patient Active Problem List   Diagnosis Date Noted   Seizure ([place]) 11/11/2018   Major depressive disorder, recurrent episode, moderate ([place]) 02/07/2018   Nephrolithiasis 04/16/2016   Numbness 07/28/2015   Bladder retention 06/23/2015   Abdominal pain 06/04/2015   Dizziness 05/18/2015   Neck pain 83/15/1761   Complicated migraine 60/73/7106   Other fatigue 04/28/2015   Abnormal finding on MRI of brain 04/28/2015   D (diarrhea) 03/29/2015   H/O disease 03/29/2015   Abnormal weight loss 03/29/2015   Muscle weakness (generalized) 03/14/2015   Headache, migraine 03/10/2015    Patrina Levering PT, DPT  Ramonita Lab, PT 12/30/2020, 12:00 PM  [facility] [place]  [place], [place], [address] Phone: [phone]   Fax:  [phone]  Name: Jakeline Dave MRN: [id number] Date of Birth: [birth year]

## 2021-01-03 ENCOUNTER — Ambulatory Visit: Payer: BC Managed Care – PPO

## 2021-01-05 ENCOUNTER — Other Ambulatory Visit: Payer: Self-pay

## 2021-01-05 ENCOUNTER — Ambulatory Visit (INDEPENDENT_AMBULATORY_CARE_PROVIDER_SITE_OTHER): Payer: BC Managed Care – PPO | Admitting: Obstetrics and Gynecology

## 2021-01-05 ENCOUNTER — Encounter: Payer: Self-pay | Admitting: Obstetrics and Gynecology

## 2021-01-05 ENCOUNTER — Ambulatory Visit: Payer: BC Managed Care – PPO | Admitting: Obstetrics and Gynecology

## 2021-01-05 VITALS — Ht 67.0 in | Wt 140.0 lb

## 2021-01-05 DIAGNOSIS — N921 Excessive and frequent menstruation with irregular cycle: Secondary | ICD-10-CM

## 2021-01-05 NOTE — Progress Notes (Signed)
Gynecology Ultrasound Follow Up   Chief Complaint  Patient presents with   Follow-up    U/s follow up   Ultrasound follow up for irregular bleeding   History of Present Illness: Patient is a 40 y.o. female who presents today for ultrasound evaluation of the above .  Ultrasound demonstrates the following findings Adnexa: no masses seen (a dominant follicle of the right ovary measuring 4.5 cm noted) Uterus: anteverted with endometrial stripe  5.0 mm Additional: no abnormal findings.   She was placed on a different medication for a UTI.  She was placed on bactrim.  She did get relief of her symptoms from one dose of diflucan.   Past Medical History:  Diagnosis Date   Complication of anesthesia    ? seizures after anesthesia    Headache    Migraines    Neurogenic bladder    Renal disorder    Vision abnormalities     Past Surgical History:  Procedure Laterality Date   ANTERIOR CRUCIATE LIGAMENT REPAIR  1997   APPENDECTOMY     COLONOSCOPY WITH PROPOFOL N/A 11/11/2018   Procedure: COLONOSCOPY WITH PROPOFOL;  Surgeon: Christena Deem, MD;  Location: [facility] ENDOSCOPY;  Service: Endoscopy;  Laterality: N/A;   CYSTOSCOPY WITH STENT PLACEMENT Right 04/17/2016   Procedure: CYSTOSCOPY WITH STENT PLACEMENT;  Surgeon: Malen Gauze, MD;  Location: [facility] ORS;  Service: Urology;  Laterality: Right;   ESOPHAGOGASTRODUODENOSCOPY (EGD) WITH PROPOFOL N/A 11/11/2018   Procedure: ESOPHAGOGASTRODUODENOSCOPY (EGD) WITH PROPOFOL;  Surgeon: Christena Deem, MD;  Location: [facility] ENDOSCOPY;  Service: Endoscopy;  Laterality: N/A;   EXPLORATORY LAPAROTOMY  1999   KIDNEY STONE SURGERY  04/2016   REVISION UROSTOMY CUTANEOUS     REVISION UROSTOMY CUTANEOUS  01/10/2018   SUPRAPUBIC CATHETER PLACEMENT  08/2017   TONSILLECTOMY      Family History  Problem Relation Age of Onset   Hypertension Mother    Atrial fibrillation Father    Healthy Brother    Depression Brother    Arthritis/Rheumatoid  Paternal Grandmother    Healthy Brother     Social History   Socioeconomic History   Marital status: Married    Spouse name: Not on file   Number of children: Not on file   Years of education: Not on file   Highest education level: Not on file  Occupational History   Not on file  Tobacco Use   Smoking status: Never   Smokeless tobacco: Never  Vaping Use   Vaping Use: Never used  Substance and Sexual Activity   Alcohol use: No    Alcohol/week: 0.0 standard drinks   Drug use: No   Sexual activity: Yes    Birth control/protection: None    Comment: vasectomy  Other Topics Concern   Not on file  Social History Narrative   Not on file   Social Determinants of Health   Financial Resource Strain: Not on file  Food Insecurity: Not on file  Transportation Needs: Not on file  Physical Activity: Not on file  Stress: Not on file  Social Connections: Not on file  Intimate Partner Violence: Not on file    Allergies  Allergen Reactions   Aspirin Shortness Of Breath, Swelling and Anaphylaxis   Ibuprofen Shortness Of Breath, Swelling and Anaphylaxis   Vibegron Anaphylaxis   Morphine And Related Hives   Methenamine Hippurate Rash    Prior to Admission medications   Medication Sig Start Date End Date Taking? Authorizing Provider  B Complex  Vitamins (VITAMIN B COMPLEX 100) INJ Inject as directed. Patient not taking: Reported on 12/23/2020    [provider]  baclofen (LIORESAL) 10 MG tablet Take 10 mg by mouth 3 (three) times daily. Patient not taking: Reported on 12/23/2020 06/17/19   [provider]  baclofen (LIORESAL) 20 MG tablet Take by mouth. 10/25/20 01/23/21  [provider]  belladonna-opium (B&O SUPPRETTES) 16.2-30 MG suppository Place 30 mg rectally every 8 (eight) hours as needed for pain.    [provider]  belladonna-opium (B&O SUPPRETTES) 16.2-30 MG suppository Place rectally. 07/21/20   [provider]  Cyanocobalamin  (VITAMIN B-12) 2500 MCG SUBL Take 2,500 mcg by mouth daily.    [provider]  cyanocobalamin 1000 MCG tablet Take by mouth.    [provider]  diazepam (VALIUM) 2 MG tablet Take 2 mg by mouth every 6 (six) hours as needed for anxiety or muscle spasms (Vaginal application for PFM spasms).    [provider]  DULoxetine (CYMBALTA) 60 MG capsule Take 60 mg by mouth daily. 12/03/20   [provider]  EMGALITY 120 MG/ML SOAJ SMARTSIG:120 Milligram(s) SUB-Q Every 4 Weeks 11/19/20   [provider]  EPINEPHrine 0.3 mg/0.3 mL IJ SOAJ injection Inject into the muscle. 08/31/20   [provider]  folic acid (FOLVITE) 1 MG tablet Take 1 mg by mouth daily.    [provider]  fosfomycin (MONUROL) 3 g PACK SMARTSIG:3 Gram(s) By Mouth Every Other Day 12/15/20   [provider]  hydrocortisone (ANUSOL-HC) 25 MG suppository Place 25 mg rectally daily as needed. 10/27/20   [provider]  linaclotide (LINZESS) 145 MCG CAPS capsule TAKE 1 CAPSULE (145 MCG TOTAL) BY MOUTH ONCE DAILY 12/19/19   [provider]  mirabegron ER (MYRBETRIQ) 50 MG TB24 tablet Take 50 mg by mouth daily.     [provider]  mirtazapine (REMERON) 30 MG tablet Take by mouth. 11/05/20   [provider]  ondansetron (ZOFRAN) 4 MG tablet Take 4 mg by mouth every 8 (eight) hours as needed for nausea or vomiting.    [provider]  oxybutynin (DITROPAN) 5 MG tablet Take 5 mg by mouth every 8 (eight) hours as needed.     [provider]  pantoprazole (PROTONIX) 40 MG tablet Take 40 mg by mouth 2 (two) times daily.  08/26/18   [provider]  pregabalin (LYRICA) 75 MG capsule Takes 2 caps po q am and 2 caps po QHS 02/14/19   [provider]  promethazine (PHENERGAN) 12.5 MG tablet Take 1 tablet (12.5 mg total) by mouth every 4 (four) hours as needed for nausea or vomiting. 01/19/17   Hildred Laser, MD   rizatriptan (MAXALT) 5 MG tablet Take 5 mg by mouth as needed.  09/21/16   [provider]  tiZANidine (ZANAFLEX) 2 MG tablet Take 2 mg by mouth at bedtime. 12/19/20   [provider]  UBRELVY 50 MG TABS Take by mouth. 11/24/20   [provider]  vortioxetine HBr (TRINTELLIX) 20 MG TABS tablet Take 1 tablet (20 mg total) by mouth daily. Patient not taking: Reported on 12/23/2020 08/27/19   Corie Chiquito, PMHNP    Physical Exam Ht 5\' 7"  (1.702 m)   Wt 140 lb (63.5 kg)   BMI 21.93 kg/m    General: NAD HEENT: normocephalic, anicteric Pulmonary: No increased work of breathing Extremities: no edema, erythema, or tenderness Neurologic: Grossly intact, normal gait Psychiatric: mood appropriate, affect  full  Imaging Results No results found.  Assessment: 40 y.o. G3P3003  1. Menorrhagia with irregular cycle      Plan: Problem List Items Addressed This Visit   None Visit Diagnoses     Menorrhagia with irregular cycle    -  Primary      Results of ultrasound were normal and not suggestive of any etiology to her problem.  She seems to be doing better from that standpoint we will continue to monitor her bleeding.  A total of 14 minutes were spent face-to-face with the patient as well as preparation, review, communication, and documentation during this encounter.    Thomasene Mohair, MD, [facility], [facility] 01/05/2021 4:51 PM

## 2021-01-06 ENCOUNTER — Ambulatory Visit: Payer: BC Managed Care – PPO

## 2021-01-06 DIAGNOSIS — R2689 Other abnormalities of gait and mobility: Secondary | ICD-10-CM

## 2021-01-06 DIAGNOSIS — R269 Unspecified abnormalities of gait and mobility: Secondary | ICD-10-CM

## 2021-01-06 DIAGNOSIS — M6281 Muscle weakness (generalized): Secondary | ICD-10-CM | POA: Diagnosis not present

## 2021-01-06 NOTE — Therapy (Signed)
[place] MAIN [facility] SERVICES [address] [place], [place], [address] Phone: [phone]   Fax:  [phone]  Physical Therapy Treatment  Patient Details  Name: Christine Chavez MRN: [id number] Date of Birth: January 26, 1981 Referring Provider (PT): Elza Rafter   Encounter Date: 01/06/2021   PT End of Session - 01/06/21 1207     Visit Number 126    Number of Visits 144    Date for PT Re-Evaluation 03/03/21    Authorization Type 6/10 PN 12/09/20    Authorization Time Period 06/28/20-09/20/20    PT Start Time 1100    PT Stop Time 1146    PT Time Calculation (min) 46 min    Equipment Utilized During Treatment Gait belt    Activity Tolerance Patient tolerated treatment well;No increased pain    Behavior During Therapy WFL for tasks assessed/performed             Past Medical History:  Diagnosis Date   Complication of anesthesia    ? seizures after anesthesia    Headache    Migraines    Neurogenic bladder    Renal disorder    Vision abnormalities     Past Surgical History:  Procedure Laterality Date   ANTERIOR CRUCIATE LIGAMENT REPAIR  1997   APPENDECTOMY     COLONOSCOPY WITH PROPOFOL N/A 11/11/2018   Procedure: COLONOSCOPY WITH PROPOFOL;  Surgeon: [name], MD;  Location: [facility] ENDOSCOPY;  Service: Endoscopy;  Laterality: N/A;   CYSTOSCOPY WITH STENT PLACEMENT Right 04/17/2016   Procedure: CYSTOSCOPY WITH STENT PLACEMENT;  Surgeon: [name], MD;  Location: [facility] ORS;  Service: Urology;  Laterality: Right;   ESOPHAGOGASTRODUODENOSCOPY (EGD) WITH PROPOFOL N/A 11/11/2018   Procedure: ESOPHAGOGASTRODUODENOSCOPY (EGD) WITH PROPOFOL;  Surgeon: [name], MD;  Location: [facility] ENDOSCOPY;  Service: Endoscopy;  Laterality: N/A;   EXPLORATORY LAPAROTOMY  1999   KIDNEY STONE SURGERY  04/2016   REVISION UROSTOMY CUTANEOUS     REVISION UROSTOMY CUTANEOUS  01/10/2018   SUPRAPUBIC CATHETER PLACEMENT  08/2017    TONSILLECTOMY      There were no vitals filed for this visit.   Subjective Assessment - 01/06/21 1204     Subjective Patient had a fall last friday when she was walking and her legs buckled under her. She reports excessive fatigue in LE's lately and not feeling well due to continuation of UTI, is on antibiotics again for it.    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 Due to the rarity of her disorder it took years prior to diagnosis. 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  Patient has good days and bad days. Her good days allow for short ambulation (within room only not between rooms) and her bad days she is unable to ambulate.    Limitations Lifting;Standing;Walking;House hold activities;Other (comment)    How long can you stand comfortably? limited on bad days    How long can you walk comfortably? within room only on good days, unable on bad days    Patient Stated Goals to regain PLOF    Currently in Pain? Yes    Pain Score 5     Pain Location Back    Pain Orientation Lower    Pain  Descriptors / Indicators Aching    Pain Type Neuropathic pain    Pain Onset 1 to 4 weeks ago    Pain Frequency Constant                   TherEx Bridge 10x with feet stabilized. Bridge with rainbow ball adduction 15x  BTB abduction 15x  each LE BTB march 15x each LE each LE with pelvic tilt  Contract relax against PT resistance 12x each LE, increased tremors with contraction  Green swiss ball hamstring curls 15x TrA activation green swiss ball 10x 3 second holds TrA activation with overhead UE raise 10x each UE Half bolster under knees: modified SAQ 15x 3 second holds each LE     supine  to sit with Min a for LE's      Manual: Supine: Single knee to chest hold 30 seconds each LE Figure four piriformis stretch 30 seconds each LE   Hamstring lengthening 60 seconds each LE with progressive range with prolonged hold.  LE rotation 60 seconds x 3 trials for low back pain reduction Sciatic nerve glide with leg on PT shoulder 30 seconds each LE        Pt educated throughout session about proper posture and technique with exercises. Improved exercise technique, movement at target joints, use of target muscles after min to mod verbal, visual, tactile cues       Education on avoiding hip adduction past midline (belly button) for reduction of catching due to diagnosis of labral tera of R     Patient recently had MRI of L hip and will be following up with orthopedic physician. Her strength is limited but improving compared to previous session in nonweightbearing however her ability to stand is significantly affected by LE buckling. Patient following up with primary care after appointment today about UTI. Discussion of safe movement of L hip to reduce catching episodes performed with patient verbalizing understanding.  Patient will continue to benefit from skilled physical therapy to increase strength, functional mobility and activity tolerance for optimal functional independence in home and community and improved quality of life    PT Education - 01/06/21 1207     Education Details exercise technique, body mechanics    Person(s) Educated Patient    Methods Explanation;Demonstration;Tactile cues;Verbal cues    Comprehension Verbalized understanding;Returned demonstration;Verbal cues required;Tactile cues required              PT Short Term Goals - 12/09/20 1201       PT SHORT TERM GOAL #1   Title Patient will demonstrate coordinated diaphragmatic breathing with pelvic tilts to demonstrate improved control of diaphragm and TA, to allow for further strengthening of core  musculature and decreased pelvic floor spasm.    Baseline Pt. demonstrates breathing dysfunction and poor PFM coordination evidenced by anal manometry As of 2/22: Pt. continues to have restriction in her diaphragm and near T/L junction but is able to intentionally use diaphragmatic breathing through the available ROM.    Time 5    Period Weeks    Status Achieved    Target Date 04/20/19      PT SHORT TERM GOAL #2   Title Patient will report a reduction in pain to no greater than 6/10 over the prior week to demonstrate symptom improvement.    Baseline Pain is 10/10 at worst, 2/10 at best As of 2/22: Pt. had achieved this goal for 1 week as of 1/21 but may have infection or other  insult that caused pain to increase again. As of 4/29: Pain high of 8/10 but has been over-doing her activity over the past week. As of 6/10: Pt. was able to keep pain at 6 or below since prior visit by using TENS to help keep tension lower but has not had a full week to test if it will keep working. As of 7/6: Pt. had met this goal but has a new UTI and it caused pain to spike to 8/10 over the weekend. 8/25: 6/10 due to back pain    Time 5    Period Weeks    Status Achieved    Target Date 01/13/21      PT SHORT TERM GOAL #3   Title Patient will demonstrate HEP x1 in the clinic to demonstrate understanding and proper form to allow for further improvement.    Baseline Pt. lacks knowledge of therepeutic exercises that can decrease her pain/Sx.    Time 5    Period Weeks    Status Achieved    Target Date 04/20/19      PT SHORT TERM GOAL #4   Title Patient will report consistent use of foot-stool (squatty-potty) for positioning with BM to decrease pain with BM and intra-abdominal pressure.    Baseline Pt. having constipation due to PFM dysfunction    Time 5    Period Weeks    Status Achieved    Target Date 04/20/19               PT Long Term Goals - 12/09/20 0001       PT LONG TERM GOAL #1   Title Pt. will  be able to participate in regular ADL's with least restrictive device without pain increasing greater than 2/10    Baseline Pt. limited in her ability to perform household duties by increased pain and fatigue. As of 4/29: Her legs have more endurance with walking before she gets a tremor. Still needs to sit down the majority of the time she is cooking. Still needs back support for prolonged sitting. As of 6/10: Pt. able to do for  ~1 hour before she needs to rest to prevent increased pain, pain is averaging ~ 4-5/10 3/14: able to perform ADLs with less pain, does still have bad days 4/25: requires stops throughout process more fatigue than pain limiting 6/2: vas of 7/10 in pelvis occasionally 8/25: able to perform ADLs without >2/10 pain increase    Time 12    Period Weeks    Status Achieved      PT LONG TERM GOAL #2   Title Patient will increase 10 meter walk test to <30 seconds as to improve gait speed for better community ambulation and to reduce fall risk.    Baseline 6/2: 1 min 18 seconds with rollator 8/25: deferred due to excessive knee buckling during TUG    Time 12    Period Weeks    Status On-going    Target Date 03/03/21      PT LONG TERM GOAL #3   Title Patient (< 45 years old) will complete five times sit to stand test in < 20 seconds indicating an increased LE strength and improved balance.    Baseline 6/2: 43 seconds with heavy BUE support 8/25: 40 seconds with SUE support    Time 12    Period Weeks    Status Partially Met    Target Date 03/03/21      PT LONG TERM GOAL #4  Title Pt. will be able to perform 1 hr. of light housework reqiring intermittent standing and walking without increased pain/fatigue >2 pts.    Baseline Pt. becomes fatigued washing fruits/vegetbles in small stages and has fatigue>3 pts. 3/14: able to perform but needs rest breaks 4/25: able to do but is fatiguing 6/2: able to perform on good days; unable to perform on bad days 8/25: 4-5/10    Time 12     Period Weeks    Status Partially Met    Target Date 03/03/21      PT LONG TERM GOAL #5   Title Patient will reduce timed up and go to <30 seconds to reduce fall risk and demonstrate improved transfer/gait ability.    Baseline 8/25: 1 min 47 seconds with rollator    Time 12    Period Weeks    Status New    Target Date 03/03/21      PT LONG TERM GOAL #6   Title Pt. will be able to attend a full athletic event for her children without Sx increasing by more than 2 pts.    Baseline Pt. having migraine or severe bladder spasms and fatigue and back pain> 3 pt. increase with attendance at athletic events 3/14: has increased pain but is able to attend now, has to use TENS unit 4/25: has not had any games to be able to attempt to go to 6/2: no games due to summer time 8/25: able to attend one event    Time 12    Period Weeks    Status Achieved      PT LONG TERM GOAL #7   Title Patient will attend two consecutive athletic events for her children indicating improved capacity for activities and quality of life.    Baseline 8/25: can only attend one at a time    Time 12    Period Weeks    Status New    Target Date 03/03/21                   Plan - 01/06/21 1209     Clinical Impression Statement Patient recently had MRI of L hip and will be following up with orthopedic physician. Her strength is limited but improving compared to previous session in nonweightbearing however her ability to stand is significantly affected by LE buckling. Patient following up with primary care after appointment today about UTI. Discussion of safe movement of L hip to reduce catching episodes performed with patient verbalizing understanding.  Patient will continue to benefit from skilled physical therapy to increase strength, functional mobility and activity tolerance for optimal functional independence in home and community and improved quality of life    Personal Factors and Comorbidities Comorbidity  3+;Transportation;Time since onset of injury/illness/exacerbation;Finances    Comorbidities neurologic insult with pending diagnosis, multiple recurrent UTI's, BLE weakness, depression, hereditary spastic paraplegia    Examination-Activity Limitations Caring for Others;Carry;Continence;Squat;Stairs;Toileting;Locomotion Level;Lift;Stand;Bed Mobility;Bend;Reach Overhead;Sit;Dressing;Transfers    Examination-Participation Restrictions Church;Cleaning;Community Activity;Driving;Interpersonal Relationship;Laundry;[employer] Work;Meal Prep;School;Volunteer    Stability/Clinical Decision Making Unstable/Unpredictable    Rehab Potential Fair    PT Frequency 2x / week    PT Duration 12 weeks    PT Treatment/Interventions ADLs/Self Care Home Management;Aquatic Therapy;Biofeedback;Moist Heat;Electrical Stimulation;Cryotherapy;Traction;Ultrasound;Therapeutic activities;Functional mobility training;Stair training;Gait training;Therapeutic exercise;Balance training;Neuromuscular re-education;Patient/family education;Manual techniques;Dry needling;Scar mobilization;Energy conservation;Taping;Joint Manipulations;Spinal Manipulations;Visual/perceptual remediation/compensation;Passive range of motion;Wheelchair mobility training;DME Instruction;Orthotic Fit/Training    PT Next Visit Plan transfers, stabilization in standing, core activation    PT Home Exercise Plan no changes  Consulted and Agree with Plan of Care Patient             Patient will benefit from skilled therapeutic intervention in order to improve the following deficits and impairments:  Abnormal gait, Decreased balance, Decreased endurance, Decreased mobility, Difficulty walking, Increased muscle spasms, Impaired sensation, Improper body mechanics, Impaired tone, Decreased activity tolerance, Decreased coordination, Decreased strength, Postural dysfunction, Pain, Impaired flexibility  Visit Diagnosis: Muscle weakness (generalized)  Other  abnormalities of gait and mobility  Abnormality of gait and mobility     Problem List Patient Active Problem List   Diagnosis Date Noted   Seizure ([place]) 11/11/2018   Major depressive disorder, recurrent episode, moderate ([place]) 02/07/2018   Nephrolithiasis 04/16/2016   Numbness 07/28/2015   Bladder retention 06/23/2015   Abdominal pain 06/04/2015   Dizziness 05/18/2015   Neck pain 41/44/3601   Complicated migraine 65/80/0634   Other fatigue 04/28/2015   Abnormal finding on MRI of brain 04/28/2015   D (diarrhea) 03/29/2015   H/O disease 03/29/2015   Abnormal weight loss 03/29/2015   Muscle weakness (generalized) 03/14/2015   Headache, migraine 03/10/2015   Janna Arch, PT, DPT  01/06/2021, 12:13 PM  [place] MAIN [facility] SERVICES [address] [place], [place], [address] Phone: [phone]   Fax:  [phone]  Name: Karizma Cheek MRN: [id number] Date of Birth: [birth year]

## 2021-01-10 ENCOUNTER — Other Ambulatory Visit: Payer: Self-pay

## 2021-01-10 ENCOUNTER — Ambulatory Visit: Payer: BC Managed Care – PPO

## 2021-01-10 DIAGNOSIS — M62838 Other muscle spasm: Secondary | ICD-10-CM

## 2021-01-10 DIAGNOSIS — M6281 Muscle weakness (generalized): Secondary | ICD-10-CM

## 2021-01-10 DIAGNOSIS — R269 Unspecified abnormalities of gait and mobility: Secondary | ICD-10-CM

## 2021-01-10 DIAGNOSIS — R2689 Other abnormalities of gait and mobility: Secondary | ICD-10-CM

## 2021-01-10 DIAGNOSIS — R262 Difficulty in walking, not elsewhere classified: Secondary | ICD-10-CM

## 2021-01-10 NOTE — Therapy (Signed)
[place] MAIN [facility] SERVICES [address] [place], [place], [address] Phone: [phone]   Fax:  [phone]  Physical Therapy Treatment  Patient Details  Name: Christine Chavez MRN: [id number] Date of Birth: [birth year] Referring Provider (PT): Elza Rafter   Encounter Date: 01/10/2021   PT End of Session - 01/10/21 1030     Visit Number 127    Number of Visits 144    Date for PT Re-Evaluation 03/03/21    Progress Note Due on Visit 130    PT Start Time 1000    PT Stop Time 1045    PT Time Calculation (min) 45 min    Equipment Utilized During Treatment Gait belt    Activity Tolerance Patient tolerated treatment well;No increased pain    Behavior During Therapy WFL for tasks assessed/performed             Past Medical History:  Diagnosis Date   Complication of anesthesia    ? seizures after anesthesia    Headache    Migraines    Neurogenic bladder    Renal disorder    Vision abnormalities     Past Surgical History:  Procedure Laterality Date   ANTERIOR CRUCIATE LIGAMENT REPAIR  1997   APPENDECTOMY     COLONOSCOPY WITH PROPOFOL N/A 11/11/2018   Procedure: COLONOSCOPY WITH PROPOFOL;  Surgeon: [name], MD;  Location: [facility] ENDOSCOPY;  Service: Endoscopy;  Laterality: N/A;   CYSTOSCOPY WITH STENT PLACEMENT Right 04/17/2016   Procedure: CYSTOSCOPY WITH STENT PLACEMENT;  Surgeon: [name], MD;  Location: [facility] ORS;  Service: Urology;  Laterality: Right;   ESOPHAGOGASTRODUODENOSCOPY (EGD) WITH PROPOFOL N/A 11/11/2018   Procedure: ESOPHAGOGASTRODUODENOSCOPY (EGD) WITH PROPOFOL;  Surgeon: [name], MD;  Location: [facility] ENDOSCOPY;  Service: Endoscopy;  Laterality: N/A;   EXPLORATORY LAPAROTOMY  1999   KIDNEY STONE SURGERY  04/2016   REVISION UROSTOMY CUTANEOUS     REVISION UROSTOMY CUTANEOUS  01/10/2018   SUPRAPUBIC CATHETER PLACEMENT  08/2017   TONSILLECTOMY      There were no vitals filed for  this visit.   Subjective Assessment - 01/10/21 1006     Subjective Pt reports still feels generally weak, not 100% sure that UTI is improving. Pt reports trying to work on her HEP when able. No falls since prior visit. Her orthopedist is looking into sending her to a hip specialist.    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 Due to the rarity of her disorder it took years prior to diagnosis. 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  Patient has good days and bad days. Her good days allow for short ambulation (within room only not between rooms) and her bad days she is unable to ambulate.    Currently in Pain? Yes    Pain Score 6     Pain Descriptors / Indicators --   back pain form bladder spams.           INTERVENTION THIS DATE:   Supine: -Single knee to chest hold 30 seconds each LE (c knee flex) -Figure four piriformis stretch 30 seconds each LE   -Hamstring lengthening 60 seconds each LE with progressive range with prolonged hold.  -LE rotation 60 seconds  x 3 trials for low back pain reduction Sciatic nerve glide x10 bilat   Supine/hooklying  -Isonmetric Clam with gait belt 12 x5secH -Bridge 10x with feet stabilized. -BTB march 15x each LE each LE with pelvic tilt  -Half bolster under knees: modified SAQ 15x 3 second holds each LE  -Green swiss ball hamstring curls 15x3SEC   Pt educated throughout session about proper posture and technique with exercises. Improved exercise technique, movement at target joints, use of target muscles after min to mod verbal, visual, tactile cues    PT Education - 01/10/21 1029     Education Details Form with exercise, technique with  exercise    Person(s) Educated Patient    Methods Explanation;Demonstration    Comprehension Verbalized understanding;Returned demonstration              PT Short Term Goals - 12/09/20 1201       PT SHORT TERM GOAL #1   Title Patient will demonstrate coordinated diaphragmatic breathing with pelvic tilts to demonstrate improved control of diaphragm and TA, to allow for further strengthening of core musculature and decreased pelvic floor spasm.    Baseline Pt. demonstrates breathing dysfunction and poor PFM coordination evidenced by anal manometry As of 2/22: Pt. continues to have restriction in her diaphragm and near T/L junction but is able to intentionally use diaphragmatic breathing through the available ROM.    Time 5    Period Weeks    Status Achieved    Target Date 04/20/19      PT SHORT TERM GOAL #2   Title Patient will report a reduction in pain to no greater than 6/10 over the prior week to demonstrate symptom improvement.    Baseline Pain is 10/10 at worst, 2/10 at best As of 2/22: Pt. had achieved this goal for 1 week as of 1/21 but may have infection or other insult that caused pain to increase again. As of 4/29: Pain high of 8/10 but has been over-doing her activity over the past week. As of 6/10: Pt. was able to keep pain at 6 or below since prior visit by using TENS to help keep tension lower but has not had a full week to test if it will keep working. As of 7/6: Pt. had met this goal but has a new UTI and it caused pain to spike to 8/10 over the weekend. 8/25: 6/10 due to back pain    Time 5    Period Weeks    Status Achieved    Target Date 01/13/21      PT SHORT TERM GOAL #3   Title Patient will demonstrate HEP x1 in the clinic to demonstrate understanding and proper form to allow for further improvement.    Baseline Pt. lacks knowledge of therepeutic exercises that can decrease her pain/Sx.    Time 5    Period Weeks    Status Achieved    Target Date 04/20/19       PT SHORT TERM GOAL #4   Title Patient will report consistent use of foot-stool (squatty-potty) for positioning with BM to decrease pain with BM and intra-abdominal pressure.    Baseline Pt. having constipation due to PFM dysfunction    Time 5    Period Weeks    Status Achieved    Target Date 04/20/19               PT Long Term Goals - 12/09/20 0001       PT LONG TERM  GOAL #1   Title Pt. will be able to participate in regular ADL's with least restrictive device without pain increasing greater than 2/10    Baseline Pt. limited in her ability to perform household duties by increased pain and fatigue. As of 4/29: Her legs have more endurance with walking before she gets a tremor. Still needs to sit down the majority of the time she is cooking. Still needs back support for prolonged sitting. As of 6/10: Pt. able to do for  ~1 hour before she needs to rest to prevent increased pain, pain is averaging ~ 4-5/10 3/14: able to perform ADLs with less pain, does still have bad days 4/25: requires stops throughout process more fatigue than pain limiting 6/2: vas of 7/10 in pelvis occasionally 8/25: able to perform ADLs without >2/10 pain increase    Time 12    Period Weeks    Status Achieved      PT LONG TERM GOAL #2   Title Patient will increase 10 meter walk test to <30 seconds as to improve gait speed for better community ambulation and to reduce fall risk.    Baseline 6/2: 1 min 18 seconds with rollator 8/25: deferred due to excessive knee buckling during TUG    Time 12    Period Weeks    Status On-going    Target Date 03/03/21      PT LONG TERM GOAL #3   Title Patient (< 38 years old) will complete five times sit to stand test in < 20 seconds indicating an increased LE strength and improved balance.    Baseline 6/2: 43 seconds with heavy BUE support 8/25: 40 seconds with SUE support    Time 12    Period Weeks    Status Partially Met    Target Date 03/03/21      PT LONG TERM GOAL  #4   Title Pt. will be able to perform 1 hr. of light housework reqiring intermittent standing and walking without increased pain/fatigue >2 pts.    Baseline Pt. becomes fatigued washing fruits/vegetbles in small stages and has fatigue>3 pts. 3/14: able to perform but needs rest breaks 4/25: able to do but is fatiguing 6/2: able to perform on good days; unable to perform on bad days 8/25: 4-5/10    Time 12    Period Weeks    Status Partially Met    Target Date 03/03/21      PT LONG TERM GOAL #5   Title Patient will reduce timed up and go to <30 seconds to reduce fall risk and demonstrate improved transfer/gait ability.    Baseline 8/25: 1 min 47 seconds with rollator    Time 12    Period Weeks    Status New    Target Date 03/03/21      PT LONG TERM GOAL #6   Title Pt. will be able to attend a full athletic event for her children without Sx increasing by more than 2 pts.    Baseline Pt. having migraine or severe bladder spasms and fatigue and back pain> 3 pt. increase with attendance at athletic events 3/14: has increased pain but is able to attend now, has to use TENS unit 4/25: has not had any games to be able to attempt to go to 6/2: no games due to summer time 8/25: able to attend one event    Time 12    Period Weeks    Status Achieved      PT LONG  TERM GOAL #7   Title Patient will attend two consecutive athletic events for her children indicating improved capacity for activities and quality of life.    Baseline 8/25: can only attend one at a time    Time 12    Period Weeks    Status New    Target Date 03/03/21                   Plan - 01/10/21 1031     Clinical Impression Statement Continued with current plan of care as laid out in evaluation and recent prior sessions. Pt remains motivated to advance progress toward goals. Rest breaks provided as needed, pt quick to ask when needed. Pt does require varying levels of assistance and cuing for completion of exercises for  correct form, often due to fatigue. Pt closely monitored throughout session for safe vitals response and to maximize patient safety during interventions. Patient will continue to benefit from skilled physical therapy to increase strength, functional mobility and activity tolerance for optimal functional independence in home and community and improved quality of life   Personal Factors and Comorbidities Comorbidity 3+;Transportation;Time since onset of injury/illness/exacerbation;Finances    Comorbidities neurologic insult with pending diagnosis, multiple recurrent UTI's, BLE weakness, depression, hereditary spastic paraplegia    Examination-Activity Limitations Caring for Others;Carry;Continence;Squat;Stairs;Toileting;Locomotion Level;Lift;Stand;Bed Mobility;Bend;Reach Overhead;Sit;Dressing;Transfers    Examination-Participation Restrictions Church;Cleaning;Community Activity;Driving;Interpersonal Relationship;Laundry;[employer] Work;Meal Prep;School;Volunteer    Stability/Clinical Decision Making Unstable/Unpredictable    Clinical Decision Making High    Rehab Potential Fair    PT Frequency 2x / week    PT Duration 12 weeks    PT Treatment/Interventions ADLs/Self Care Home Management;Aquatic Therapy;Biofeedback;Moist Heat;Electrical Stimulation;Cryotherapy;Traction;Ultrasound;Therapeutic activities;Functional mobility training;Stair training;Gait training;Therapeutic exercise;Balance training;Neuromuscular re-education;Patient/family education;Manual techniques;Dry needling;Scar mobilization;Energy conservation;Taping;Joint Manipulations;Spinal Manipulations;Visual/perceptual remediation/compensation;Passive range of motion;Wheelchair mobility training;DME Instruction;Orthotic Fit/Training    PT Next Visit Plan transfers, stabilization in standing, core activation    PT Home Exercise Plan No updates today    Consulted and Agree with Plan of Care Patient             Patient will benefit from skilled  therapeutic intervention in order to improve the following deficits and impairments:  Abnormal gait, Decreased balance, Decreased endurance, Decreased mobility, Difficulty walking, Increased muscle spasms, Impaired sensation, Improper body mechanics, Impaired tone, Decreased activity tolerance, Decreased coordination, Decreased strength, Postural dysfunction, Pain, Impaired flexibility  Visit Diagnosis: Muscle weakness (generalized)  Other abnormalities of gait and mobility  Abnormality of gait and mobility  Difficulty in walking, not elsewhere classified  Other muscle spasm     Problem List Patient Active Problem List   Diagnosis Date Noted   Seizure ([place]) 11/11/2018   Major depressive disorder, recurrent episode, moderate ([facility]) 02/07/2018   Nephrolithiasis 04/16/2016   Numbness 07/28/2015   Bladder retention 06/23/2015   Abdominal pain 06/04/2015   Dizziness 05/18/2015   Neck pain 42/35/3614   Complicated migraine 43/15/4008   Other fatigue 04/28/2015   Abnormal finding on MRI of brain 04/28/2015   D (diarrhea) 03/29/2015   H/O disease 03/29/2015   Abnormal weight loss 03/29/2015   Muscle weakness (generalized) 03/14/2015   Headache, migraine 03/10/2015   10:56 AM, 01/10/21 Etta Grandchild, PT, DPT Physical Therapist - [facility]  Outpatient Physical Therapy- [place] [phone]     [place], PT 01/10/2021, 10:41 AM  [place] [facility] SERVICES [address] [place], [place], [address] Phone: [phone]   Fax:  [phone]  Name:  Hellena Pridgen MRN: [id number] Date of Birth: [birth year]

## 2021-01-13 ENCOUNTER — Ambulatory Visit: Payer: BC Managed Care – PPO

## 2021-01-17 ENCOUNTER — Ambulatory Visit: Payer: BC Managed Care – PPO

## 2021-01-20 ENCOUNTER — Ambulatory Visit: Payer: BC Managed Care – PPO | Attending: Gastroenterology

## 2021-01-20 DIAGNOSIS — R2689 Other abnormalities of gait and mobility: Secondary | ICD-10-CM | POA: Insufficient documentation

## 2021-01-20 DIAGNOSIS — M62838 Other muscle spasm: Secondary | ICD-10-CM | POA: Insufficient documentation

## 2021-01-20 DIAGNOSIS — R262 Difficulty in walking, not elsewhere classified: Secondary | ICD-10-CM | POA: Insufficient documentation

## 2021-01-20 DIAGNOSIS — M6281 Muscle weakness (generalized): Secondary | ICD-10-CM | POA: Insufficient documentation

## 2021-01-20 DIAGNOSIS — R269 Unspecified abnormalities of gait and mobility: Secondary | ICD-10-CM | POA: Insufficient documentation

## 2021-01-24 ENCOUNTER — Other Ambulatory Visit: Payer: Self-pay

## 2021-01-24 ENCOUNTER — Ambulatory Visit: Payer: BC Managed Care – PPO | Admitting: Physical Therapy

## 2021-01-24 DIAGNOSIS — M62838 Other muscle spasm: Secondary | ICD-10-CM | POA: Diagnosis present

## 2021-01-24 DIAGNOSIS — R269 Unspecified abnormalities of gait and mobility: Secondary | ICD-10-CM | POA: Diagnosis present

## 2021-01-24 DIAGNOSIS — R262 Difficulty in walking, not elsewhere classified: Secondary | ICD-10-CM | POA: Diagnosis present

## 2021-01-24 DIAGNOSIS — M6281 Muscle weakness (generalized): Secondary | ICD-10-CM

## 2021-01-24 DIAGNOSIS — R2689 Other abnormalities of gait and mobility: Secondary | ICD-10-CM | POA: Diagnosis present

## 2021-01-24 NOTE — Therapy (Signed)
[place] MAIN [facility] SERVICES [address] [place], [place], [address] Phone: [phone]   Fax:  [phone]  Physical Therapy Treatment  Patient Details  Name: Christine Chavez MRN: [id number] Date of Birth: 02-04-1981 Referring Provider (PT): Elza Rafter   Encounter Date: 01/24/2021   PT End of Session - 01/24/21 1110     Visit Number 128    Number of Visits 144    Date for PT Re-Evaluation 03/03/21    Progress Note Due on Visit 130    PT Start Time 1015    PT Stop Time 1100    PT Time Calculation (min) 45 min    Equipment Utilized During Treatment Gait belt    Activity Tolerance Patient tolerated treatment well;No increased pain    Behavior During Therapy WFL for tasks assessed/performed             Past Medical History:  Diagnosis Date   Complication of anesthesia    ? seizures after anesthesia    Headache    Migraines    Neurogenic bladder    Renal disorder    Vision abnormalities     Past Surgical History:  Procedure Laterality Date   ANTERIOR CRUCIATE LIGAMENT REPAIR  1997   APPENDECTOMY     COLONOSCOPY WITH PROPOFOL N/A 11/11/2018   Procedure: COLONOSCOPY WITH PROPOFOL;  Surgeon: [name], MD;  Location: [facility] ENDOSCOPY;  Service: Endoscopy;  Laterality: N/A;   CYSTOSCOPY WITH STENT PLACEMENT Right 04/17/2016   Procedure: CYSTOSCOPY WITH STENT PLACEMENT;  Surgeon: [name], MD;  Location: [facility] ORS;  Service: Urology;  Laterality: Right;   ESOPHAGOGASTRODUODENOSCOPY (EGD) WITH PROPOFOL N/A 11/11/2018   Procedure: ESOPHAGOGASTRODUODENOSCOPY (EGD) WITH PROPOFOL;  Surgeon: [name], MD;  Location: [facility] ENDOSCOPY;  Service: Endoscopy;  Laterality: N/A;   EXPLORATORY LAPAROTOMY  1999   KIDNEY STONE SURGERY  04/2016   REVISION UROSTOMY CUTANEOUS     REVISION UROSTOMY CUTANEOUS  01/10/2018   SUPRAPUBIC CATHETER PLACEMENT  08/2017   TONSILLECTOMY      There were no vitals filed  for this visit.   Subjective Assessment - 01/24/21 1018     Subjective Pt reports she bladder/bowel are still flared up. Since previous session, she has had ablation 9/29 and missed following PT sessions due to pain and elevated vitals. Ortho PA spoke to surgeon and are concerned about healing delays and have decided to start with B hip injections followed with targeted PT and a few weeks later follow up with surgeon. Pt does endorse one fall 3 weeks ago and another on her girls trip last week.    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 Due to the rarity of her disorder it took years prior to diagnosis. 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  Patient has good days and bad days. Her good days allow for short ambulation (within room only not between rooms) and her bad days she is unable to ambulate.    Limitations Lifting;Standing;Walking;House hold activities;Other (comment)    How long can you stand comfortably? limited on bad days    How long can you walk comfortably? within room only on good days, unable on bad days    Patient Stated Goals to  regain PLOF    Currently in Pain? Yes    Pain Score 6     Pain Location Back    Pain Orientation Lower    Pain Type Neuropathic pain;Chronic pain    Pain Frequency Constant              TherEx Bridge 10x with feet stabilized. Bridge with rainbow ball adduction 15x  BTB abduction 15x  each LE BTB march 15x each LE each LE with pelvic tilt  Green swiss ball hamstring curls 15x   Supine to sit and stand-pivot transfer with CGA for safety.  Moist heat pack applied to low back for length of session to improve comfort.       Manual: Supine: Single knee to chest hold 30 seconds each LE Figure four piriformis stretch 30 seconds each LE   Hamstring lengthening 60 seconds each LE with progressive range with prolonged hold.  LE rotation 60 seconds x 3 trials for low back pain reduction Sciatic nerve glide with leg on PT shoulder 30 seconds each LE        Pt educated throughout session about proper posture and technique with exercises. Improved exercise technique, movement at target joints, use of target muscles after min to mod verbal, visual, tactile cues          Pt demonstrates excellent motivation throughout today's session. She does have corticosteroid injections to bilateral hips scheduled with orthopedics for 10/31 with a plan to shift focus of PT to bilateral hip strengthening after injections. Pt continues to demonstrate significant LE buckling noted during stand-pivot transfer with heavy reliance on UE support for safety. Pt described the challenges she has endured over the past couple weeks including physical and emotional - pain/unstable vitals s/p ablation and depression. Today she had improved tolerance of stretches and therex compared to previous session. Moist heat was applied to low back for improved comfort. Patient will continue to benefit from skilled physical therapy to increase strength, functional mobility and activity tolerance for optimal functional independence in home and community and improved quality of life.                 PT Short Term Goals - 12/09/20 1201       PT SHORT TERM GOAL #1   Title Patient will demonstrate coordinated diaphragmatic breathing with pelvic tilts to demonstrate improved control of diaphragm and TA, to allow for further strengthening of core musculature and decreased pelvic floor spasm.    Baseline Pt. demonstrates breathing dysfunction and poor PFM coordination evidenced by anal manometry As of 2/22: Pt. continues to have restriction in her diaphragm  and near T/L junction but is able to intentionally use diaphragmatic breathing through the available ROM.    Time 5    Period Weeks    Status Achieved    Target Date 04/20/19      PT SHORT TERM GOAL #2   Title Patient will report a reduction in pain to no greater than 6/10 over the prior week to demonstrate symptom improvement.    Baseline Pain is 10/10 at worst, 2/10 at best As of 2/22: Pt. had achieved this goal for 1 week as of 1/21 but may have infection or other insult that caused pain to increase again. As of 4/29: Pain high of 8/10 but has been over-doing her activity over the past week. As of 6/10: Pt. was able to keep pain at 6 or below since prior visit by using TENS to help keep  tension lower but has not had a full week to test if it will keep working. As of 7/6: Pt. had met this goal but has a new UTI and it caused pain to spike to 8/10 over the weekend. 8/25: 6/10 due to back pain    Time 5    Period Weeks    Status Achieved    Target Date 01/13/21      PT SHORT TERM GOAL #3   Title Patient will demonstrate HEP x1 in the clinic to demonstrate understanding and proper form to allow for further improvement.    Baseline Pt. lacks knowledge of therepeutic exercises that can decrease her pain/Sx.    Time 5    Period Weeks    Status Achieved    Target Date 04/20/19      PT SHORT TERM GOAL #4   Title Patient will report consistent use of foot-stool (squatty-potty) for positioning with BM to decrease pain with BM and intra-abdominal pressure.    Baseline Pt. having constipation due to PFM dysfunction    Time 5    Period Weeks    Status Achieved    Target Date 04/20/19               PT Long Term Goals - 12/09/20 0001       PT LONG TERM GOAL #1   Title Pt. will be able to participate in regular ADL's with least restrictive device without pain increasing greater than 2/10    Baseline Pt. limited in her ability to perform household duties by increased pain and fatigue. As  of 4/29: Her legs have more endurance with walking before she gets a tremor. Still needs to sit down the majority of the time she is cooking. Still needs back support for prolonged sitting. As of 6/10: Pt. able to do for  ~1 hour before she needs to rest to prevent increased pain, pain is averaging ~ 4-5/10 3/14: able to perform ADLs with less pain, does still have bad days 4/25: requires stops throughout process more fatigue than pain limiting 6/2: vas of 7/10 in pelvis occasionally 8/25: able to perform ADLs without >2/10 pain increase    Time 12    Period Weeks    Status Achieved      PT LONG TERM GOAL #2   Title Patient will increase 10 meter walk test to <30 seconds as to improve gait speed for better community ambulation and to reduce fall risk.    Baseline 6/2: 1 min 18 seconds with rollator 8/25: deferred due to excessive knee buckling during TUG    Time 12    Period Weeks    Status On-going    Target Date 03/03/21      PT LONG TERM GOAL #3   Title Patient (< 26 years old) will complete five times sit to stand test in < 20 seconds indicating an increased LE strength and improved balance.    Baseline 6/2: 43 seconds with heavy BUE support 8/25: 40 seconds with SUE support    Time 12    Period Weeks    Status Partially Met    Target Date 03/03/21      PT LONG TERM GOAL #4   Title Pt. will be able to perform 1 hr. of light housework reqiring intermittent standing and walking without increased pain/fatigue >2 pts.    Baseline Pt. becomes fatigued washing fruits/vegetbles in small stages and has fatigue>3 pts. 3/14: able to perform but needs rest breaks  4/25: able to do but is fatiguing 6/2: able to perform on good days; unable to perform on bad days 8/25: 4-5/10    Time 12    Period Weeks    Status Partially Met    Target Date 03/03/21      PT LONG TERM GOAL #5   Title Patient will reduce timed up and go to <30 seconds to reduce fall risk and demonstrate improved transfer/gait  ability.    Baseline 8/25: 1 min 47 seconds with rollator    Time 12    Period Weeks    Status New    Target Date 03/03/21      PT LONG TERM GOAL #6   Title Pt. will be able to attend a full athletic event for her children without Sx increasing by more than 2 pts.    Baseline Pt. having migraine or severe bladder spasms and fatigue and back pain> 3 pt. increase with attendance at athletic events 3/14: has increased pain but is able to attend now, has to use TENS unit 4/25: has not had any games to be able to attempt to go to 6/2: no games due to summer time 8/25: able to attend one event    Time 12    Period Weeks    Status Achieved      PT LONG TERM GOAL #7   Title Patient will attend two consecutive athletic events for her children indicating improved capacity for activities and quality of life.    Baseline 8/25: can only attend one at a time    Time 12    Period Weeks    Status New    Target Date 03/03/21                   Plan - 01/24/21 1110     Clinical Impression Statement Pt demonstrates excellent motivation throughout today's session. She does have corticosteroid injections to bilateral hips scheduled with orthopedics for 10/31 with a plan to shift focus of PT to bilateral hip strengthening after injections. Pt continues to demonstrate significant LE buckling noted during stand-pivot transfer with heavy reliance on UE support for safety. Pt described the challenges she has endured over the past couple weeks including physical and emotional - pain/unstable vitals s/p ablation and depression. Today she had improved tolerance of stretches and therex compared to previous session. Moist heat was applied to low back for improved comfort. Patient will continue to benefit from skilled physical therapy to increase strength, functional mobility and activity tolerance for optimal functional independence in home and community and improved quality of life.    Personal Factors and  Comorbidities Comorbidity 3+;Transportation;Time since onset of injury/illness/exacerbation;Finances    Comorbidities neurologic insult with pending diagnosis, multiple recurrent UTI's, BLE weakness, depression, hereditary spastic paraplegia    Examination-Activity Limitations Caring for Others;Carry;Continence;Squat;Stairs;Toileting;Locomotion Level;Lift;Stand;Bed Mobility;Bend;Reach Overhead;Sit;Dressing;Transfers    Examination-Participation Restrictions Church;Cleaning;Community Activity;Driving;Interpersonal Relationship;Laundry;[employer] Work;Meal Prep;School;Volunteer    Stability/Clinical Decision Making Unstable/Unpredictable    Rehab Potential Fair    PT Frequency 2x / week    PT Duration 12 weeks    PT Treatment/Interventions ADLs/Self Care Home Management;Aquatic Therapy;Biofeedback;Moist Heat;Electrical Stimulation;Cryotherapy;Traction;Ultrasound;Therapeutic activities;Functional mobility training;Stair training;Gait training;Therapeutic exercise;Balance training;Neuromuscular re-education;Patient/family education;Manual techniques;Dry needling;Scar mobilization;Energy conservation;Taping;Joint Manipulations;Spinal Manipulations;Visual/perceptual remediation/compensation;Passive range of motion;Wheelchair mobility training;DME Instruction;Orthotic Fit/Training    PT Next Visit Plan transfers, stabilization in standing, core activation    PT Home Exercise Plan No updates today    Consulted and Agree with Plan of Care Patient  Patient will benefit from skilled therapeutic intervention in order to improve the following deficits and impairments:  Abnormal gait, Decreased balance, Decreased endurance, Decreased mobility, Difficulty walking, Increased muscle spasms, Impaired sensation, Improper body mechanics, Impaired tone, Decreased activity tolerance, Decreased coordination, Decreased strength, Postural dysfunction, Pain, Impaired flexibility  Visit Diagnosis: Muscle weakness  (generalized)  Difficulty in walking, not elsewhere classified  Other abnormalities of gait and mobility  Other muscle spasm  Abnormality of gait and mobility     Problem List Patient Active Problem List   Diagnosis Date Noted   Seizure ([place]) 11/11/2018   Major depressive disorder, recurrent episode, moderate ([place]) 02/07/2018   Nephrolithiasis 04/16/2016   Numbness 07/28/2015   Bladder retention 06/23/2015   Abdominal pain 06/04/2015   Dizziness 05/18/2015   Neck pain 17/03/7870   Complicated migraine 83/67/2550   Other fatigue 04/28/2015   Abnormal finding on MRI of brain 04/28/2015   D (diarrhea) 03/29/2015   H/O disease 03/29/2015   Abnormal weight loss 03/29/2015   Muscle weakness (generalized) 03/14/2015   Headache, migraine 03/10/2015    Patrina Levering PT, DPT  Ramonita Lab, PT 01/24/2021, 11:26 AM  [place] MAIN [facility] SERVICES [address] [place], [place], [address] Phone: [phone]   Fax:  [phone]  Name: Christine Chavez MRN: [id number] Date of Birth: Jul 30, 1980

## 2021-01-27 ENCOUNTER — Other Ambulatory Visit: Payer: Self-pay

## 2021-01-27 ENCOUNTER — Ambulatory Visit: Payer: BC Managed Care – PPO

## 2021-01-27 DIAGNOSIS — R262 Difficulty in walking, not elsewhere classified: Secondary | ICD-10-CM

## 2021-01-27 DIAGNOSIS — M6281 Muscle weakness (generalized): Secondary | ICD-10-CM

## 2021-01-27 DIAGNOSIS — R2689 Other abnormalities of gait and mobility: Secondary | ICD-10-CM

## 2021-01-27 NOTE — Therapy (Signed)
[place] [facility]IN [facility] SERVICES [address] [place], [place], [address] Phone: [phone]   Fax:  [phone]  Physical Therapy Treatment  Patient Details  Name: Christine Chavez MRN: [id number] Date of Birth: [birth year] Referring Provider (PT): Elza Rafter   Encounter Date: 01/27/2021   PT End of Session - 01/27/21 1245     Visit Number 129    Number of Visits 144    Date for PT Re-Evaluation 03/03/21    Progress Note Due on Visit 130    PT Start Time 1102    PT Stop Time 1146    PT Time Calculation (min) 44 min    Equipment Utilized During Treatment Gait belt    Activity Tolerance Patient tolerated treatment well;No increased pain    Behavior During Therapy WFL for tasks assessed/performed             Past Medical History:  Diagnosis Date   Complication of anesthesia    ? seizures after anesthesia    Headache    Migraines    Neurogenic bladder    Renal disorder    Vision abnormalities     Past Surgical History:  Procedure Laterality Date   ANTERIOR CRUCIATE LIGAMENT REPAIR  1997   APPENDECTOMY     COLONOSCOPY WITH PROPOFOL N/A 11/11/2018   Procedure: COLONOSCOPY WITH PROPOFOL;  Surgeon: [name], MD;  Location: [facility] ENDOSCOPY;  Service: Endoscopy;  Laterality: N/A;   CYSTOSCOPY WITH STENT PLACEMENT Right 04/17/2016   Procedure: CYSTOSCOPY WITH STENT PLACEMENT;  Surgeon: [name], MD;  Location: [facility] ORS;  Service: Urology;  Laterality: Right;   ESOPHAGOGASTRODUODENOSCOPY (EGD) WITH PROPOFOL N/A 11/11/2018   Procedure: ESOPHAGOGASTRODUODENOSCOPY (EGD) WITH PROPOFOL;  Surgeon: [name], MD;  Location: [facility] ENDOSCOPY;  Service: Endoscopy;  Laterality: N/A;   EXPLORATORY LAPAROTOMY  1999   KIDNEY STONE SURGERY  04/2016   REVISION UROSTOMY CUTANEOUS     REVISION UROSTOMY CUTANEOUS  01/10/2018   SUPRAPUBIC CATHETER PLACEMENT  08/2017   TONSILLECTOMY      There were no vitals filed  for this visit.   Subjective Assessment - 01/27/21 1240     Subjective Patient continues to have spasms of her bladder. No falls or LOB since last session. Started having a migraine at start of session and took medication for it.    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 Due to the rarity of her disorder it took years prior to diagnosis. 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  Patient has good days and bad days. Her good days allow for short ambulation (within room only not between rooms) and her bad days she is unable to ambulate.    Limitations Lifting;Standing;Walking;House hold activities;Other (comment)    How long can you stand comfortably? limited on bad days    How long can you walk comfortably? within room only on good days, unable on bad days    Patient Stated Goals to regain PLOF    Currently in Pain? Yes    Pain Score 6     Pain Location Head    Pain Orientation Anterior    Pain Descriptors / Indicators Aching;Headache    Pain Type Neuropathic pain  TherEx Bridge 10x with feet stabilized. GTB abduction 15x  each LE x 2 sets GTB march 15x each LE each LE with pelvic tilt x2 sets  GTB adduction 15x each LE; 2 sets    Seated: LAQ 12x each LE  Supine to sit and stand-pivot transfer with CGA for safety.  Moist heat pack applied to low back for length of session to improve comfort.      Manual: Supine: Single knee to chest hold 30 seconds each LE Figure four piriformis stretch 30 seconds each LE   Hamstring lengthening 60 seconds each LE with progressive range with prolonged hold. X2 trials LE rotation 60 seconds x 3 trials for low back  pain reduction Sciatic nerve glide with leg on PT shoulder 30 seconds each LE        Pt educated throughout session about proper posture and technique with exercises. Improved exercise technique, movement at target joints, use of target muscles after min to mod verbal, visual, tactile cues     Patient session limited due to migraine and increased pain in back. She is highly motivated despite her pain and increased tremors. Patient required increased prolonged holds for reduced stiffness and pain. Moist heat was applied to low back for improved comfort. Patient will continue to benefit from skilled physical therapy to increase strength, functional mobility and activity tolerance for optimal functional independence in home and community and improved quality of life                 PT Education - 01/27/21 1245     Education Details exercise techniques, body mechanics    Person(s) Educated Patient    Methods Explanation;Demonstration;Tactile cues;Verbal cues    Comprehension Verbalized understanding;Returned demonstration;Verbal cues required;Tactile cues required              PT Short Term Goals - 12/09/20 1201       PT SHORT TERM GOAL #1   Title Patient will demonstrate coordinated diaphragmatic breathing with pelvic tilts to demonstrate improved control of diaphragm and TA, to allow for further strengthening of core musculature and decreased pelvic floor spasm.    Baseline Pt. demonstrates breathing dysfunction and poor PFM coordination evidenced by anal manometry As of 2/22: Pt. continues to have restriction in her diaphragm and near T/L junction but is able to intentionally use diaphragmatic breathing through the available ROM.    Time 5    Period Weeks    Status Achieved    Target Date 04/20/19      PT SHORT TERM GOAL #2   Title Patient will report a reduction in pain to no greater than 6/10 over the prior week to demonstrate symptom improvement.    Baseline Pain  is 10/10 at worst, 2/10 at best As of 2/22: Pt. had achieved this goal for 1 week as of 1/21 but may have infection or other insult that caused pain to increase again. As of 4/29: Pain high of 8/10 but has been over-doing her activity over the past week. As of 6/10: Pt. was able to keep pain at 6 or below since prior visit by using TENS to help keep tension lower but has not had a full week to test if it will keep working. As of 7/6: Pt. had met this goal but has a new UTI and it caused pain to spike to 8/10 over the weekend. 8/25: 6/10 due to back pain    Time 5    Period Weeks  Status Achieved    Target Date 01/13/21      PT SHORT TERM GOAL #3   Title Patient will demonstrate HEP x1 in the clinic to demonstrate understanding and proper form to allow for further improvement.    Baseline Pt. lacks knowledge of therepeutic exercises that can decrease her pain/Sx.    Time 5    Period Weeks    Status Achieved    Target Date 04/20/19      PT SHORT TERM GOAL #4   Title Patient will report consistent use of foot-stool (squatty-potty) for positioning with BM to decrease pain with BM and intra-abdominal pressure.    Baseline Pt. having constipation due to PFM dysfunction    Time 5    Period Weeks    Status Achieved    Target Date 04/20/19               PT Long Term Goals - 12/09/20 0001       PT LONG TERM GOAL #1   Title Pt. will be able to participate in regular ADL's with least restrictive device without pain increasing greater than 2/10    Baseline Pt. limited in her ability to perform household duties by increased pain and fatigue. As of 4/29: Her legs have more endurance with walking before she gets a tremor. Still needs to sit down the majority of the time she is cooking. Still needs back support for prolonged sitting. As of 6/10: Pt. able to do for  ~1 hour before she needs to rest to prevent increased pain, pain is averaging ~ 4-5/10 3/14: able to perform ADLs with less pain, does  still have bad days 4/25: requires stops throughout process more fatigue than pain limiting 6/2: vas of 7/10 in pelvis occasionally 8/25: able to perform ADLs without >2/10 pain increase    Time 12    Period Weeks    Status Achieved      PT LONG TERM GOAL #2   Title Patient will increase 10 meter walk test to <30 seconds as to improve gait speed for better community ambulation and to reduce fall risk.    Baseline 6/2: 1 min 18 seconds with rollator 8/25: deferred due to excessive knee buckling during TUG    Time 12    Period Weeks    Status On-going    Target Date 03/03/21      PT LONG TERM GOAL #3   Title Patient (< 30 years old) will complete five times sit to stand test in < 20 seconds indicating an increased LE strength and improved balance.    Baseline 6/2: 43 seconds with heavy BUE support 8/25: 40 seconds with SUE support    Time 12    Period Weeks    Status Partially Met    Target Date 03/03/21      PT LONG TERM GOAL #4   Title Pt. will be able to perform 1 hr. of light housework reqiring intermittent standing and walking without increased pain/fatigue >2 pts.    Baseline Pt. becomes fatigued washing fruits/vegetbles in small stages and has fatigue>3 pts. 3/14: able to perform but needs rest breaks 4/25: able to do but is fatiguing 6/2: able to perform on good days; unable to perform on bad days 8/25: 4-5/10    Time 12    Period Weeks    Status Partially Met    Target Date 03/03/21      PT LONG TERM GOAL #5   Title Patient will  reduce timed up and go to <30 seconds to reduce fall risk and demonstrate improved transfer/gait ability.    Baseline 8/25: 1 min 47 seconds with rollator    Time 12    Period Weeks    Status New    Target Date 03/03/21      PT LONG TERM GOAL #6   Title Pt. will be able to attend a full athletic event for her children without Sx increasing by more than 2 pts.    Baseline Pt. having migraine or severe bladder spasms and fatigue and back pain> 3  pt. increase with attendance at athletic events 3/14: has increased pain but is able to attend now, has to use TENS unit 4/25: has not had any games to be able to attempt to go to 6/2: no games due to summer time 8/25: able to attend one event    Time 12    Period Weeks    Status Achieved      PT LONG TERM GOAL #7   Title Patient will attend two consecutive athletic events for her children indicating improved capacity for activities and quality of life.    Baseline 8/25: can only attend one at a time    Time 12    Period Weeks    Status New    Target Date 03/03/21                   Plan - 01/27/21 1708     Clinical Impression Statement Patient session limited due to migraine and increased pain in back. She is highly motivated despite her pain and increased tremors. Patient required increased prolonged holds for reduced stiffness and pain. Moist heat was applied to low back for improved comfort. Patient will continue to benefit from skilled physical therapy to increase strength, functional mobility and activity tolerance for optimal functional independence in home and community and improved quality of life    Personal Factors and Comorbidities Comorbidity 3+;Transportation;Time since onset of injury/illness/exacerbation;Finances    Comorbidities neurologic insult with pending diagnosis, multiple recurrent UTI's, BLE weakness, depression, hereditary spastic paraplegia    Examination-Activity Limitations Caring for Others;Carry;Continence;Squat;Stairs;Toileting;Locomotion Level;Lift;Stand;Bed Mobility;Bend;Reach Overhead;Sit;Dressing;Transfers    Examination-Participation Restrictions Church;Cleaning;Community Activity;Driving;Interpersonal Relationship;Laundry;[employer] Work;Meal Prep;School;Volunteer    Stability/Clinical Decision Making Unstable/Unpredictable    Rehab Potential Fair    PT Frequency 2x / week    PT Duration 12 weeks    PT Treatment/Interventions ADLs/Self Care Home  Management;Aquatic Therapy;Biofeedback;Moist Heat;Electrical Stimulation;Cryotherapy;Traction;Ultrasound;Therapeutic activities;Functional mobility training;Stair training;Gait training;Therapeutic exercise;Balance training;Neuromuscular re-education;Patient/family education;Manual techniques;Dry needling;Scar mobilization;Energy conservation;Taping;Joint Manipulations;Spinal Manipulations;Visual/perceptual remediation/compensation;Passive range of motion;Wheelchair mobility training;DME Instruction;Orthotic Fit/Training    PT Next Visit Plan transfers, stabilization in standing, core activation    PT Home Exercise Plan No updates today    Consulted and Agree with Plan of Care Patient             Patient will benefit from skilled therapeutic intervention in order to improve the following deficits and impairments:  Abnormal gait, Decreased balance, Decreased endurance, Decreased mobility, Difficulty walking, Increased muscle spasms, Impaired sensation, Improper body mechanics, Impaired tone, Decreased activity tolerance, Decreased coordination, Decreased strength, Postural dysfunction, Pain, Impaired flexibility  Visit Diagnosis: Muscle weakness (generalized)  Difficulty in walking, not elsewhere classified  Other abnormalities of gait and mobility     Problem List Patient Active Problem List   Diagnosis Date Noted   Seizure ([place]) 11/11/2018   Major depressive disorder, recurrent episode, moderate ([place]) 02/07/2018   Nephrolithiasis 04/16/2016   Numbness 07/28/2015  Bladder retention 06/23/2015   Abdominal pain 06/04/2015   Dizziness 05/18/2015   Neck pain 91/66/0600   Complicated migraine 45/99/7741   Other fatigue 04/28/2015   Abnormal finding on MRI of brain 04/28/2015   D (diarrhea) 03/29/2015   H/O disease 03/29/2015   Abnormal weight loss 03/29/2015   Muscle weakness (generalized) 03/14/2015   Headache, migraine 03/10/2015    Janna Arch, PT, DPT  01/27/2021, 5:10  PM  [place] [facility] SERVICES [name] [place], [place], [address] Phone: [phone]   Fax:  [phone]  Name: Christine Chavez MRN: [id number] Date of Birth: [birth year]

## 2021-01-31 ENCOUNTER — Other Ambulatory Visit: Payer: Self-pay

## 2021-01-31 ENCOUNTER — Ambulatory Visit: Payer: BC Managed Care – PPO

## 2021-01-31 DIAGNOSIS — M6281 Muscle weakness (generalized): Secondary | ICD-10-CM | POA: Diagnosis not present

## 2021-01-31 DIAGNOSIS — R262 Difficulty in walking, not elsewhere classified: Secondary | ICD-10-CM

## 2021-01-31 DIAGNOSIS — R2689 Other abnormalities of gait and mobility: Secondary | ICD-10-CM

## 2021-01-31 NOTE — Therapy (Signed)
[place] [facility] SERVICES [address] [place], [place], [address] Phone: [phone]   Fax:  [phone]  Physical Therapy Treatment Physical Therapy Progress Note   Dates of reporting period  12/09/20   to   01/31/21   Patient Details  Name: Christine Chavez MRN: [id number] Date of Birth: 03-24-1981 Referring Provider (PT): Elza Rafter   Encounter Date: 01/31/2021   PT End of Session - 01/31/21 1014     Visit Number 130    Number of Visits 144    Date for PT Re-Evaluation 03/03/21    Authorization Type next session 1/10 PN 01/31/21    Progress Note Due on Visit 130    PT Start Time 1015    PT Stop Time 1059    PT Time Calculation (min) 44 min    Equipment Utilized During Treatment Gait belt    Activity Tolerance Patient tolerated treatment well;No increased pain    Behavior During Therapy WFL for tasks assessed/performed             Past Medical History:  Diagnosis Date   Complication of anesthesia    ? seizures after anesthesia    Headache    Migraines    Neurogenic bladder    Renal disorder    Vision abnormalities     Past Surgical History:  Procedure Laterality Date   ANTERIOR CRUCIATE LIGAMENT REPAIR  1997   APPENDECTOMY     COLONOSCOPY WITH PROPOFOL N/A 11/11/2018   Procedure: COLONOSCOPY WITH PROPOFOL;  Surgeon: [name], MD;  Location: [facility] ENDOSCOPY;  Service: Endoscopy;  Laterality: N/A;   CYSTOSCOPY WITH STENT PLACEMENT Right 04/17/2016   Procedure: CYSTOSCOPY WITH STENT PLACEMENT;  Surgeon: [name], MD;  Location: [facility] ORS;  Service: Urology;  Laterality: Right;   ESOPHAGOGASTRODUODENOSCOPY (EGD) WITH PROPOFOL N/A 11/11/2018   Procedure: ESOPHAGOGASTRODUODENOSCOPY (EGD) WITH PROPOFOL;  Surgeon: [name], MD;  Location: [facility] ENDOSCOPY;  Service: Endoscopy;  Laterality: N/A;   EXPLORATORY LAPAROTOMY  1999   KIDNEY STONE SURGERY  04/2016   REVISION UROSTOMY CUTANEOUS      REVISION UROSTOMY CUTANEOUS  01/10/2018   SUPRAPUBIC CATHETER PLACEMENT  08/2017   TONSILLECTOMY      There were no vitals filed for this visit.   Subjective Assessment - 01/31/21 1512     Subjective Patient went to her childhood friend's brothers funeral over the weekend. Had to be in the car 7+ hours and is "paying for it".    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 Due to the rarity of her disorder it took years prior to diagnosis. 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  Patient has good days and bad days. Her good days allow for short ambulation (within room only not between rooms) and her bad days she is unable to ambulate.    Limitations Lifting;Standing;Walking;House hold activities;Other (comment)    How long can you stand comfortably? limited on bad days    How long can you walk comfortably? within room only on good days, unable on bad days    Patient Stated Goals to regain PLOF               Goals:  10 MWT 1 min 38  seconds with rollator and frequent knee buckling.  5xSTS: 23 seconds with BUE ; 21 seconds with SUE support  1 hour of light housework with fatigue less than 2/10 : able to perform 40% of the time TUG 1 min 2 seconds Attend two consecutive athletic events: can attend 1.5 games.     Treatment: Supine: with heat behind back  Bridge with adduction (green ball between knees) 10x Single knee to chest hold 30 seconds each LE Figure four piriformis stretch 30 seconds each LE   Hamstring lengthening 60 seconds each LE with progressive range with prolonged hold. X2 trials LE rotation 60 seconds x 3 trials for low back pain reduction   Pt educated  throughout session about proper posture and technique with exercises. Improved exercise technique, movement at target joints, use of target muscles after min to mod verbal, visual, tactile cues.     Patient's condition has the potential to improve in response to therapy. Maximum improvement is yet to be obtained. The anticipated improvement is attainable and reasonable in a generally predictable time.  Patient reports she is not yet where she wants to be, wants to continue therapy.   Patient demonstrates excellent progression towards functional goals with improved 5x STS by 20 seconds, and TUG by over 40 seconds indicating improved function and mobility. Patient able to perform all goals this session for first time in a while. Patient is highly motivated and will continue to demonstrate good prognosis at this time. Patient's condition has the potential to improve in response to therapy. Maximum improvement is yet to be obtained. The anticipated improvement is attainable and reasonable in a generally predictable time.  Patient will continue to benefit from skilled physical therapy to increase strength, functional mobility and activity tolerance for optimal functional independence in home and community and improved quality of life              PT Short Term Goals - 12/09/20 1201       PT SHORT TERM GOAL #1   Title Patient will demonstrate coordinated diaphragmatic breathing with pelvic tilts to demonstrate improved control of diaphragm and TA, to allow for further strengthening of core musculature and decreased pelvic floor spasm.    Baseline Pt. demonstrates breathing dysfunction and poor PFM coordination evidenced by anal manometry As of 2/22: Pt. continues to have restriction in her diaphragm and near T/L junction but is able to intentionally use diaphragmatic breathing through the available ROM.    Time 5    Period Weeks    Status Achieved    Target Date 04/20/19      PT SHORT TERM  GOAL #2   Title Patient will report a reduction in pain to no greater than 6/10 over the prior week to demonstrate symptom improvement.    Baseline Pain is 10/10 at worst, 2/10 at best As of 2/22: Pt. had achieved this goal for 1 week as of 1/21 but may have infection or other insult that caused pain to increase again. As of 4/29: Pain high of 8/10 but has been over-doing her activity over the past week. As of 6/10: Pt. was able to keep pain at 6 or below since prior visit by using TENS to help keep tension lower but has not had a full week to test if it will keep working. As of 7/6: Pt. had met this goal but has a new UTI and it caused pain to spike to 8/10 over the weekend. 8/25: 6/10 due to back  pain    Time 5    Period Weeks    Status Achieved    Target Date 01/13/21      PT SHORT TERM GOAL #3   Title Patient will demonstrate HEP x1 in the clinic to demonstrate understanding and proper form to allow for further improvement.    Baseline Pt. lacks knowledge of therepeutic exercises that can decrease her pain/Sx.    Time 5    Period Weeks    Status Achieved    Target Date 04/20/19      PT SHORT TERM GOAL #4   Title Patient will report consistent use of foot-stool (squatty-potty) for positioning with BM to decrease pain with BM and intra-abdominal pressure.    Baseline Pt. having constipation due to PFM dysfunction    Time 5    Period Weeks    Status Achieved    Target Date 04/20/19               PT Long Term Goals - 01/31/21 0001       PT LONG TERM GOAL #1   Title Pt. will be able to participate in regular ADL's with least restrictive device without pain increasing greater than 2/10    Baseline Pt. limited in her ability to perform household duties by increased pain and fatigue. As of 4/29: Her legs have more endurance with walking before she gets a tremor. Still needs to sit down the majority of the time she is cooking. Still needs back support for prolonged sitting. As of 6/10:  Pt. able to do for  ~1 hour before she needs to rest to prevent increased pain, pain is averaging ~ 4-5/10 3/14: able to perform ADLs with less pain, does still have bad days 4/25: requires stops throughout process more fatigue than pain limiting 6/2: vas of 7/10 in pelvis occasionally 8/25: able to perform ADLs without >2/10 pain increase    Time 12    Period Weeks    Status Achieved      PT LONG TERM GOAL #2   Title Patient will increase 10 meter walk test to <30 seconds as to improve gait speed for better community ambulation and to reduce fall risk.    Baseline 6/2: 1 min 18 seconds with rollator 8/25: deferred due to excessive knee buckling during TUG 10/17: 1 min 38 seconds with frequent knee buckling    Time 12    Period Weeks    Status On-going    Target Date 03/03/21      PT LONG TERM GOAL #3   Title Patient (< 35 years old) will complete five times sit to stand test in < 20 seconds indicating an increased LE strength and improved balance.    Baseline 6/2: 43 seconds with heavy BUE support 8/25: 40 seconds with SUE support 10/17: 21 seconds with SUE support    Time 12    Period Weeks    Status Partially Met    Target Date 03/03/21      PT LONG TERM GOAL #4   Title Pt. will be able to perform 1 hr. of light housework reqiring intermittent standing and walking without increased pain/fatigue >2 pts.    Baseline Pt. becomes fatigued washing fruits/vegetbles in small stages and has fatigue>3 pts. 3/14: able to perform but needs rest breaks 4/25: able to do but is fatiguing 6/2: able to perform on good days; unable to perform on bad days 8/25: 4-5/10 10/17: 40% of the time  Time 12    Period Weeks    Status Partially Met    Target Date 03/03/21      PT LONG TERM GOAL #5   Title Patient will reduce timed up and go to <30 seconds to reduce fall risk and demonstrate improved transfer/gait ability.    Baseline 8/25: 1 min 47 seconds with rollator 10/17: 1 min 2 seconds    Time 12     Period Weeks    Status Partially Met    Target Date 03/03/21      PT LONG TERM GOAL #7   Title Patient will attend two consecutive athletic events for her children indicating improved capacity for activities and quality of life.    Baseline 8/25: can only attend one at a time 10/17: 1.5 seconds    Time 12    Period Weeks    Status Partially Met    Target Date 03/03/21                   Plan - 01/31/21 1659     Clinical Impression Statement Patient demonstrates excellent progression towards functional goals with improved 5x STS by 20 seconds, and TUG by over 40 seconds indicating improved function and mobility. Patient able to perform all goals this session for first time in a while. Patient is highly motivated and will continue to demonstrate good prognosis at this time. ?Patient's condition has the potential to improve in response to therapy. Maximum improvement is yet to be obtained. The anticipated improvement is attainable and reasonable in a generally predictable time.  Patient will continue to benefit from skilled physical therapy to increase strength, functional mobility and activity tolerance for optimal functional independence in home and community and improved quality of life    Personal Factors and Comorbidities Comorbidity 3+;Transportation;Time since onset of injury/illness/exacerbation;Finances    Comorbidities neurologic insult with pending diagnosis, multiple recurrent UTI's, BLE weakness, depression, hereditary spastic paraplegia    Examination-Activity Limitations Caring for Others;Carry;Continence;Squat;Stairs;Toileting;Locomotion Level;Lift;Stand;Bed Mobility;Bend;Reach Overhead;Sit;Dressing;Transfers    Examination-Participation Restrictions Church;Cleaning;Community Activity;Driving;Interpersonal Relationship;Laundry;[employer] Work;Meal Prep;School;Volunteer    Stability/Clinical Decision Making Unstable/Unpredictable    Rehab Potential Fair    PT Frequency 2x / week     PT Duration 12 weeks    PT Treatment/Interventions ADLs/Self Care Home Management;Aquatic Therapy;Biofeedback;Moist Heat;Electrical Stimulation;Cryotherapy;Traction;Ultrasound;Therapeutic activities;Functional mobility training;Stair training;Gait training;Therapeutic exercise;Balance training;Neuromuscular re-education;Patient/family education;Manual techniques;Dry needling;Scar mobilization;Energy conservation;Taping;Joint Manipulations;Spinal Manipulations;Visual/perceptual remediation/compensation;Passive range of motion;Wheelchair mobility training;DME Instruction;Orthotic Fit/Training    PT Next Visit Plan transfers, stabilization in standing, core activation    PT Home Exercise Plan No updates today    Consulted and Agree with Plan of Care Patient             Patient will benefit from skilled therapeutic intervention in order to improve the following deficits and impairments:  Abnormal gait, Decreased balance, Decreased endurance, Decreased mobility, Difficulty walking, Increased muscle spasms, Impaired sensation, Improper body mechanics, Impaired tone, Decreased activity tolerance, Decreased coordination, Decreased strength, Postural dysfunction, Pain, Impaired flexibility  Visit Diagnosis: Muscle weakness (generalized)  Difficulty in walking, not elsewhere classified  Other abnormalities of gait and mobility     Problem List Patient Active Problem List   Diagnosis Date Noted   Seizure ([place]) 11/11/2018   Major depressive disorder, recurrent episode, moderate ([place]) 02/07/2018   Nephrolithiasis 04/16/2016   Numbness 07/28/2015   Bladder retention 06/23/2015   Abdominal pain 06/04/2015   Dizziness 05/18/2015   Neck pain 94/85/4627   Complicated migraine 03/50/0938   Other fatigue 04/28/2015  Abnormal finding on MRI of brain 04/28/2015   D (diarrhea) 03/29/2015   H/O disease 03/29/2015   Abnormal weight loss 03/29/2015   Muscle weakness (generalized) 03/14/2015    Headache, migraine 03/10/2015    Janna Arch, PT, DPT  01/31/2021, 5:07 PM  [place] [facility] SERVICES [address] [place], [place], [address] Phone: [phone]   Fax:  [phone]  Name: Christine Chavez MRN: [id number] Date of Birth: 05/26/1980

## 2021-02-03 ENCOUNTER — Other Ambulatory Visit: Payer: Self-pay

## 2021-02-03 ENCOUNTER — Ambulatory Visit: Payer: BC Managed Care – PPO

## 2021-02-03 DIAGNOSIS — M6281 Muscle weakness (generalized): Secondary | ICD-10-CM | POA: Diagnosis not present

## 2021-02-03 DIAGNOSIS — R269 Unspecified abnormalities of gait and mobility: Secondary | ICD-10-CM

## 2021-02-03 DIAGNOSIS — M62838 Other muscle spasm: Secondary | ICD-10-CM

## 2021-02-03 NOTE — Therapy (Signed)
[place] MAIN [facility] SERVICES [address] [place], [place], [address] Phone: [phone]   Fax:  [phone]  Physical Therapy Treatment  Patient Details  Name: Christine Chavez MRN: [id number] Date of Birth: 03/29/1981 Referring Provider (PT): Elza Rafter   Encounter Date: 02/03/2021   PT End of Session - 02/03/21 1159     Visit Number 131    Number of Visits 144    Date for PT Re-Evaluation 03/03/21    Authorization Type next session 1/10 PN 01/31/21    Progress Note Due on Visit 130    PT Start Time 1100    PT Stop Time 1145    PT Time Calculation (min) 45 min    Equipment Utilized During Treatment Gait belt    Activity Tolerance Patient tolerated treatment well;No increased pain    Behavior During Therapy WFL for tasks assessed/performed             Past Medical History:  Diagnosis Date   Complication of anesthesia    ? seizures after anesthesia    Headache    Migraines    Neurogenic bladder    Renal disorder    Vision abnormalities     Past Surgical History:  Procedure Laterality Date   ANTERIOR CRUCIATE LIGAMENT REPAIR  1997   APPENDECTOMY     COLONOSCOPY WITH PROPOFOL N/A 11/11/2018   Procedure: COLONOSCOPY WITH PROPOFOL;  Surgeon: [name], MD;  Location: [facility] ENDOSCOPY;  Service: Endoscopy;  Laterality: N/A;   CYSTOSCOPY WITH STENT PLACEMENT Right 04/17/2016   Procedure: CYSTOSCOPY WITH STENT PLACEMENT;  Surgeon: [name], MD;  Location: [facility] ORS;  Service: Urology;  Laterality: Right;   ESOPHAGOGASTRODUODENOSCOPY (EGD) WITH PROPOFOL N/A 11/11/2018   Procedure: ESOPHAGOGASTRODUODENOSCOPY (EGD) WITH PROPOFOL;  Surgeon: [name], MD;  Location: [facility] ENDOSCOPY;  Service: Endoscopy;  Laterality: N/A;   EXPLORATORY LAPAROTOMY  1999   KIDNEY STONE SURGERY  04/2016   REVISION UROSTOMY CUTANEOUS     REVISION UROSTOMY CUTANEOUS  01/10/2018   SUPRAPUBIC CATHETER PLACEMENT  08/2017    TONSILLECTOMY      There were no vitals filed for this visit.   Subjective Assessment - 02/03/21 1116     Subjective Patient had a lousy day yesterday and is still feeling fatigued. She reports back pain has been getting worse and is radiating up paraspinals which was worsened after attending son's soccer game outside last night. Reports new wheelchair should be arriving today.    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 Due to the rarity of her disorder it took years prior to diagnosis. 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  Patient has good days and bad days. Her good days allow for short ambulation (within room only not between rooms) and her bad days she is unable to ambulate.    Limitations Lifting;Standing;Walking;House hold activities;Other (comment)    How long can you stand comfortably? limited on bad days    How long can you walk comfortably? within room only on good days, unable on bad days    Patient Stated Goals to regain PLOF    Currently in Pain? Yes    Pain Score 6     Pain Location Back  Pain Descriptors / Indicators Spasm;Tightness;Tender             TherEx:  Figure 4 stretch for piriformis, 2x 60 sec each LE Passive hamstring lengthening 2x 60 sec each LE with progressive range and prolonged holds.  Butterfly stretch with diaphragmatic breathwork sequencing and rib expansion work for relaxation, folded pillow under each knee for support.  Hooklying double knee fallouts 1x20 Pelvic tilts in hooklying for lumbar mobility 1x20  Single knee to chest active assisted 1x5 followed by 30 second hold while working on slow belly breathing each LE.   Manual  Therapy: 12 min x paraspinal soft tissue work paired with rib expansion breathing with patient in sidelying; pillow between knees for support.     Patient was educated on adding diaphragmatic breathing sequence for pain relief and relaxation into HEP.       PT Education - 02/03/21 1159     Education Details body mechanics, rib expansion and diaphragmatic breathing for relaxation and pain relief.    Person(s) Educated Patient    Methods Explanation;Demonstration;Verbal cues;Tactile cues    Comprehension Verbalized understanding;Returned demonstration              PT Short Term Goals - 12/09/20 1201       PT SHORT TERM GOAL #1   Title Patient will demonstrate coordinated diaphragmatic breathing with pelvic tilts to demonstrate improved control of diaphragm and TA, to allow for further strengthening of core musculature and decreased pelvic floor spasm.    Baseline Pt. demonstrates breathing dysfunction and poor PFM coordination evidenced by anal manometry As of 2/22: Pt. continues to have restriction in her diaphragm and near T/L junction but is able to intentionally use diaphragmatic breathing through the available ROM.    Time 5    Period Weeks    Status Achieved    Target Date 04/20/19      PT SHORT TERM GOAL #2   Title Patient will report a reduction in pain to no greater than 6/10 over the prior week to demonstrate symptom improvement.    Baseline Pain is 10/10 at worst, 2/10 at best As of 2/22: Pt. had achieved this goal for 1 week as of 1/21 but may have infection or other insult that caused pain to increase again. As of 4/29: Pain high of 8/10 but has been over-doing her activity over the past week. As of 6/10: Pt. was able to keep pain at 6 or below since prior visit by using TENS to help keep tension lower but has not had a full week to test if it will keep working. As of 7/6: Pt. had met this goal but has a new UTI and it caused pain to spike to 8/10 over the weekend.  8/25: 6/10 due to back pain    Time 5    Period Weeks    Status Achieved    Target Date 01/13/21      PT SHORT TERM GOAL #3   Title Patient will demonstrate HEP x1 in the clinic to demonstrate understanding and proper form to allow for further improvement.    Baseline Pt. lacks knowledge of therepeutic exercises that can decrease her pain/Sx.    Time 5    Period Weeks    Status Achieved    Target Date 04/20/19      PT SHORT TERM GOAL #4   Title Patient will report consistent use of foot-stool (squatty-potty) for positioning with BM to decrease pain with BM and intra-abdominal pressure.  Baseline Pt. having constipation due to PFM dysfunction    Time 5    Period Weeks    Status Achieved    Target Date 04/20/19               PT Long Term Goals - 01/31/21 0001       PT LONG TERM GOAL #1   Title Pt. will be able to participate in regular ADL's with least restrictive device without pain increasing greater than 2/10    Baseline Pt. limited in her ability to perform household duties by increased pain and fatigue. As of 4/29: Her legs have more endurance with walking before she gets a tremor. Still needs to sit down the majority of the time she is cooking. Still needs back support for prolonged sitting. As of 6/10: Pt. able to do for  ~1 hour before she needs to rest to prevent increased pain, pain is averaging ~ 4-5/10 3/14: able to perform ADLs with less pain, does still have bad days 4/25: requires stops throughout process more fatigue than pain limiting 6/2: vas of 7/10 in pelvis occasionally 8/25: able to perform ADLs without >2/10 pain increase    Time 12    Period Weeks    Status Achieved      PT LONG TERM GOAL #2   Title Patient will increase 10 meter walk test to <30 seconds as to improve gait speed for better community ambulation and to reduce fall risk.    Baseline 6/2: 1 min 18 seconds with rollator 8/25: deferred due to excessive knee buckling during TUG 10/17: 1 min  38 seconds with frequent knee buckling    Time 12    Period Weeks    Status On-going    Target Date 03/03/21      PT LONG TERM GOAL #3   Title Patient (< 51 years old) will complete five times sit to stand test in < 20 seconds indicating an increased LE strength and improved balance.    Baseline 6/2: 43 seconds with heavy BUE support 8/25: 40 seconds with SUE support 10/17: 21 seconds with SUE support    Time 12    Period Weeks    Status Partially Met    Target Date 03/03/21      PT LONG TERM GOAL #4   Title Pt. will be able to perform 1 hr. of light housework reqiring intermittent standing and walking without increased pain/fatigue >2 pts.    Baseline Pt. becomes fatigued washing fruits/vegetbles in small stages and has fatigue>3 pts. 3/14: able to perform but needs rest breaks 4/25: able to do but is fatiguing 6/2: able to perform on good days; unable to perform on bad days 8/25: 4-5/10 10/17: 40% of the time    Time 12    Period Weeks    Status Partially Met    Target Date 03/03/21      PT LONG TERM GOAL #5   Title Patient will reduce timed up and go to <30 seconds to reduce fall risk and demonstrate improved transfer/gait ability.    Baseline 8/25: 1 min 47 seconds with rollator 10/17: 1 min 2 seconds    Time 12    Period Weeks    Status Partially Met    Target Date 03/03/21      PT LONG TERM GOAL #7   Title Patient will attend two consecutive athletic events for her children indicating improved capacity for activities and quality of life.    Baseline 8/25:  can only attend one at a time 10/17: 1.5 seconds    Time 12    Period Weeks    Status Partially Met    Target Date 03/03/21                   Plan - 02/03/21 1200     Clinical Impression Statement Patient presents to PT today with complaints of fatigue and increased back pain and tightness. Gentle TherEx, active stretching, and manual therapy helped reduce her back pain and patient was encouraged to take the  next few days easy to avoid further symptom exacerbation. Patient was re-educated on diaphragmatic breathing for relaxation and pain relief and she verbalized willingness to add to her HEP. Patient will continue to benefit from skilled PT to increase strength, functional mobility, activity tolerance and reduce pain to promote functional independence and improved quality of life.    Personal Factors and Comorbidities Comorbidity 3+;Transportation;Time since onset of injury/illness/exacerbation;Finances    Comorbidities neurologic insult with pending diagnosis, multiple recurrent UTI's, BLE weakness, depression, hereditary spastic paraplegia    Examination-Activity Limitations Caring for Others;Carry;Continence;Squat;Stairs;Toileting;Locomotion Level;Lift;Stand;Bed Mobility;Bend;Reach Overhead;Sit;Dressing;Transfers    Examination-Participation Restrictions Church;Cleaning;Community Activity;Driving;Interpersonal Relationship;Laundry;[employer] Work;Meal Prep;School;Volunteer    Stability/Clinical Decision Making Unstable/Unpredictable    Rehab Potential Fair    PT Frequency 2x / week    PT Duration 12 weeks    PT Treatment/Interventions ADLs/Self Care Home Management;Aquatic Therapy;Biofeedback;Moist Heat;Electrical Stimulation;Cryotherapy;Traction;Ultrasound;Therapeutic activities;Functional mobility training;Stair training;Gait training;Therapeutic exercise;Balance training;Neuromuscular re-education;Patient/family education;Manual techniques;Dry needling;Scar mobilization;Energy conservation;Taping;Joint Manipulations;Spinal Manipulations;Visual/perceptual remediation/compensation;Passive range of motion;Wheelchair mobility training;DME Instruction;Orthotic Fit/Training    PT Next Visit Plan transfers, stabilization in standing, core activation    PT Home Exercise Plan No updates today    Consulted and Agree with Plan of Care Patient             Patient will benefit from skilled therapeutic  intervention in order to improve the following deficits and impairments:  Abnormal gait, Decreased balance, Decreased endurance, Decreased mobility, Difficulty walking, Increased muscle spasms, Impaired sensation, Improper body mechanics, Impaired tone, Decreased activity tolerance, Decreased coordination, Decreased strength, Postural dysfunction, Pain, Impaired flexibility  Visit Diagnosis: Muscle weakness (generalized)  Other muscle spasm  Abnormality of gait and mobility     Problem List Patient Active Problem List   Diagnosis Date Noted   Seizure ([place]) 11/11/2018   Major depressive disorder, recurrent episode, moderate ([facility]) 02/07/2018   Nephrolithiasis 04/16/2016   Numbness 07/28/2015   Bladder retention 06/23/2015   Abdominal pain 06/04/2015   Dizziness 05/18/2015   Neck pain 66/09/3014   Complicated migraine 04/25/3233   Other fatigue 04/28/2015   Abnormal finding on MRI of brain 04/28/2015   D (diarrhea) 03/29/2015   H/O disease 03/29/2015   Abnormal weight loss 03/29/2015   Muscle weakness (generalized) 03/14/2015   Headache, migraine 03/10/2015     Arsenio Katz, SPT  Arsenio Katz, Student-PT 02/03/2021, 12:09 PM   This entire session was performed under direct supervision and direction of a licensed therapist/therapist assistant . I have personally read, edited and approve of the note as written.  Gwenlyn Saran, PT, DPT 02/03/21, 4:00 PM      [place] MAIN [facility] SERVICES [address] [place], [place], [address] Phone: [phone]   Fax:  [phone]  Name: Christine Chavez MRN: [id number] Date of Birth: [birth year]

## 2021-02-07 ENCOUNTER — Ambulatory Visit: Payer: BC Managed Care – PPO

## 2021-02-10 ENCOUNTER — Other Ambulatory Visit: Payer: Self-pay

## 2021-02-10 ENCOUNTER — Ambulatory Visit: Payer: BC Managed Care – PPO

## 2021-02-10 DIAGNOSIS — M62838 Other muscle spasm: Secondary | ICD-10-CM

## 2021-02-10 DIAGNOSIS — M6281 Muscle weakness (generalized): Secondary | ICD-10-CM

## 2021-02-10 DIAGNOSIS — R2689 Other abnormalities of gait and mobility: Secondary | ICD-10-CM

## 2021-02-10 NOTE — Therapy (Signed)
[place] MAIN [facility] SERVICES [address] [place], [place], [address] Phone: [phone]   Fax:  [phone]  Physical Therapy Treatment  Patient Details  Name: Christine Chavez MRN: [id number] Date of Birth: 10-26-1980 Referring Provider (PT): Elza Rafter   Encounter Date: 02/10/2021   PT End of Session - 02/10/21 1233     Visit Number 132    Number of Visits 144    Date for PT Re-Evaluation 03/03/21    Authorization Type 2/10 PN 01/31/21    Progress Note Due on Visit 130    PT Start Time 1100    PT Stop Time 1146    PT Time Calculation (min) 46 min    Equipment Utilized During Treatment Gait belt    Activity Tolerance Patient tolerated treatment well;No increased pain    Behavior During Therapy WFL for tasks assessed/performed             Past Medical History:  Diagnosis Date   Complication of anesthesia    ? seizures after anesthesia    Headache    Migraines    Neurogenic bladder    Renal disorder    Vision abnormalities     Past Surgical History:  Procedure Laterality Date   ANTERIOR CRUCIATE LIGAMENT REPAIR  1997   APPENDECTOMY     COLONOSCOPY WITH PROPOFOL N/A 11/11/2018   Procedure: COLONOSCOPY WITH PROPOFOL;  Surgeon: [name], MD;  Location: [facility] ENDOSCOPY;  Service: Endoscopy;  Laterality: N/A;   CYSTOSCOPY WITH STENT PLACEMENT Right 04/17/2016   Procedure: CYSTOSCOPY WITH STENT PLACEMENT;  Surgeon: [name], MD;  Location: [facility] ORS;  Service: Urology;  Laterality: Right;   ESOPHAGOGASTRODUODENOSCOPY (EGD) WITH PROPOFOL N/A 11/11/2018   Procedure: ESOPHAGOGASTRODUODENOSCOPY (EGD) WITH PROPOFOL;  Surgeon: [name], MD;  Location: [facility] ENDOSCOPY;  Service: Endoscopy;  Laterality: N/A;   EXPLORATORY LAPAROTOMY  1999   KIDNEY STONE SURGERY  04/2016   REVISION UROSTOMY CUTANEOUS     REVISION UROSTOMY CUTANEOUS  01/10/2018   SUPRAPUBIC CATHETER PLACEMENT  08/2017    TONSILLECTOMY      There were no vitals filed for this visit.   Subjective Assessment - 02/10/21 1158     Subjective Patient's new wheelchair has arrived. Changed medication this morning for bladder spasms and is feeling dizzy and loopy. Will miss monday appointment due to getting hip injections. Is having L shoulder and neck pain from transferring.    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 Due to the rarity of her disorder it took years prior to diagnosis. 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  Patient has good days and bad days. Her good days allow for short ambulation (within room only not between rooms) and her bad days she is unable to ambulate.    Limitations Lifting;Standing;Walking;House hold activities;Other (comment)    How long can you stand comfortably? limited on bad days    How long can you walk comfortably? within room only on good days, unable on bad days    Patient Stated Goals to regain PLOF    Currently in Pain? Yes    Pain Score 6     Pain Location Back    Pain Orientation Lower;Upper;Right    Pain  Descriptors / Indicators Aching;Squeezing;Spasm    Pain Type Neuropathic pain    Pain Onset 1 to 4 weeks ago    Pain Frequency Constant                  Wheelchair portion: Education on foot plates, ability to swing plates and leg component in to reduce tripping hazard for transfers -education and performance of wheelchair propulsion, cueing to bring shoulders down, decrease extension and increase adduction to body for proper form, education on turning technique and body mechanics x8 minutes  Supine: L shoulder and cervical mini assessment: scapular  protraction and elevation with swelling of upper trapezius musculature. Grade II cervical mobs WFL  -cervical side bend with overpressure at glenohumeral joint 20--30 seconds x 2 trials -cervical rotation with overpressure at glenohumeral joint 20-30 seconds x 2 trials -glenohumeral AP mobilization grade II; 8x 5 second pertubation's -glenohumeral AP with scapular retraction and depression in supine position 8x 5 second perturbations/mobilizations  -GH distraction with cross body adduction 10x    Sidelying: 12 min x paraspinal soft tissue work paired with rib expansion breathing with patient in sidelying; pillow between knees for support.  Seated: Scapular retraction and mobilization inferior and medially 30 seconds    Pt educated throughout session about proper posture and technique with exercises. Improved exercise technique, movement at target joints, use of target muscles after min to mod verbal, visual, tactile cues.  Patient educated on safe wheelchair propulsion and mobility. She has increased dizziness and "foggyness" from her medication change limiting full education on wheelchair at this time. Patient's shoulder evaluated with noted scapular elevation and protraction with increased edema and tenderness to L upper trap. Patient will continue to benefit from skilled PT to increase strength, functional mobility, activity tolerance and reduce pain to promote functional independence and improved quality of life.                   PT Education - 02/10/21 1230     Education Details wheelchair mobility, rib expansion, pain relief.    Person(s) Educated Patient    Methods Explanation;Demonstration;Tactile cues;Verbal cues    Comprehension Verbalized understanding;Returned demonstration;Verbal cues required;Tactile cues required              PT Short Term Goals - 12/09/20 1201       PT SHORT TERM GOAL #1   Title Patient will demonstrate coordinated diaphragmatic  breathing with pelvic tilts to demonstrate improved control of diaphragm and TA, to allow for further strengthening of core musculature and decreased pelvic floor spasm.    Baseline Pt. demonstrates breathing dysfunction and poor PFM coordination evidenced by anal manometry As of 2/22: Pt. continues to have restriction in her diaphragm and near T/L junction but is able to intentionally use diaphragmatic breathing through the available ROM.    Time 5    Period Weeks    Status Achieved    Target Date 04/20/19      PT SHORT TERM GOAL #2   Title Patient will report a reduction in pain to no greater than 6/10 over the prior week to demonstrate symptom improvement.    Baseline Pain is 10/10 at worst, 2/10 at best As of 2/22: Pt. had achieved this goal for 1 week as of 1/21 but may have infection or other insult that caused pain to increase again. As of 4/29: Pain high of 8/10 but has been over-doing her activity over the past week. As of 6/10: Pt. was able to  keep pain at 6 or below since prior visit by using TENS to help keep tension lower but has not had a full week to test if it will keep working. As of 7/6: Pt. had met this goal but has a new UTI and it caused pain to spike to 8/10 over the weekend. 8/25: 6/10 due to back pain    Time 5    Period Weeks    Status Achieved    Target Date 01/13/21      PT SHORT TERM GOAL #3   Title Patient will demonstrate HEP x1 in the clinic to demonstrate understanding and proper form to allow for further improvement.    Baseline Pt. lacks knowledge of therepeutic exercises that can decrease her pain/Sx.    Time 5    Period Weeks    Status Achieved    Target Date 04/20/19      PT SHORT TERM GOAL #4   Title Patient will report consistent use of foot-stool (squatty-potty) for positioning with BM to decrease pain with BM and intra-abdominal pressure.    Baseline Pt. having constipation due to PFM dysfunction    Time 5    Period Weeks    Status Achieved     Target Date 04/20/19               PT Long Term Goals - 01/31/21 0001       PT LONG TERM GOAL #1   Title Pt. will be able to participate in regular ADL's with least restrictive device without pain increasing greater than 2/10    Baseline Pt. limited in her ability to perform household duties by increased pain and fatigue. As of 4/29: Her legs have more endurance with walking before she gets a tremor. Still needs to sit down the majority of the time she is cooking. Still needs back support for prolonged sitting. As of 6/10: Pt. able to do for  ~1 hour before she needs to rest to prevent increased pain, pain is averaging ~ 4-5/10 3/14: able to perform ADLs with less pain, does still have bad days 4/25: requires stops throughout process more fatigue than pain limiting 6/2: vas of 7/10 in pelvis occasionally 8/25: able to perform ADLs without >2/10 pain increase    Time 12    Period Weeks    Status Achieved      PT LONG TERM GOAL #2   Title Patient will increase 10 meter walk test to <30 seconds as to improve gait speed for better community ambulation and to reduce fall risk.    Baseline 6/2: 1 min 18 seconds with rollator 8/25: deferred due to excessive knee buckling during TUG 10/17: 1 min 38 seconds with frequent knee buckling    Time 12    Period Weeks    Status On-going    Target Date 03/03/21      PT LONG TERM GOAL #3   Title Patient (< 59 years old) will complete five times sit to stand test in < 20 seconds indicating an increased LE strength and improved balance.    Baseline 6/2: 43 seconds with heavy BUE support 8/25: 40 seconds with SUE support 10/17: 21 seconds with SUE support    Time 12    Period Weeks    Status Partially Met    Target Date 03/03/21      PT LONG TERM GOAL #4   Title Pt. will be able to perform 1 hr. of light housework reqiring intermittent standing and  walking without increased pain/fatigue >2 pts.    Baseline Pt. becomes fatigued washing  fruits/vegetbles in small stages and has fatigue>3 pts. 3/14: able to perform but needs rest breaks 4/25: able to do but is fatiguing 6/2: able to perform on good days; unable to perform on bad days 8/25: 4-5/10 10/17: 40% of the time    Time 12    Period Weeks    Status Partially Met    Target Date 03/03/21      PT LONG TERM GOAL #5   Title Patient will reduce timed up and go to <30 seconds to reduce fall risk and demonstrate improved transfer/gait ability.    Baseline 8/25: 1 min 47 seconds with rollator 10/17: 1 min 2 seconds    Time 12    Period Weeks    Status Partially Met    Target Date 03/03/21      PT LONG TERM GOAL #7   Title Patient will attend two consecutive athletic events for her children indicating improved capacity for activities and quality of life.    Baseline 8/25: can only attend one at a time 10/17: 1.5 seconds    Time 12    Period Weeks    Status Partially Met    Target Date 03/03/21                   Plan - 02/10/21 1242     Clinical Impression Statement Patient educated on safe wheelchair propulsion and mobility. She has increased dizziness and "foggyness" from her medication change limiting full education on wheelchair at this time. Patient's shoulder evaluated with noted scapular elevation and protraction with increased edema and tenderness to L upper trap. Patient will continue to benefit from skilled PT to increase strength, functional mobility, activity tolerance and reduce pain to promote functional independence and improved quality of life.    Personal Factors and Comorbidities Comorbidity 3+;Transportation;Time since onset of injury/illness/exacerbation;Finances    Comorbidities neurologic insult with pending diagnosis, multiple recurrent UTI's, BLE weakness, depression, hereditary spastic paraplegia    Examination-Activity Limitations Caring for Others;Carry;Continence;Squat;Stairs;Toileting;Locomotion Level;Lift;Stand;Bed Mobility;Bend;Reach  Overhead;Sit;Dressing;Transfers    Examination-Participation Restrictions Church;Cleaning;Community Activity;Driving;Interpersonal Relationship;Laundry;[employer] Work;Meal Prep;School;Volunteer    Stability/Clinical Decision Making Unstable/Unpredictable    Rehab Potential Fair    PT Frequency 2x / week    PT Duration 12 weeks    PT Treatment/Interventions ADLs/Self Care Home Management;Aquatic Therapy;Biofeedback;Moist Heat;Electrical Stimulation;Cryotherapy;Traction;Ultrasound;Therapeutic activities;Functional mobility training;Stair training;Gait training;Therapeutic exercise;Balance training;Neuromuscular re-education;Patient/family education;Manual techniques;Dry needling;Scar mobilization;Energy conservation;Taping;Joint Manipulations;Spinal Manipulations;Visual/perceptual remediation/compensation;Passive range of motion;Wheelchair mobility training;DME Instruction;Orthotic Fit/Training    PT Next Visit Plan transfers, stabilization in standing, core activation    PT Home Exercise Plan No updates today    Consulted and Agree with Plan of Care Patient             Patient will benefit from skilled therapeutic intervention in order to improve the following deficits and impairments:  Abnormal gait, Decreased balance, Decreased endurance, Decreased mobility, Difficulty walking, Increased muscle spasms, Impaired sensation, Improper body mechanics, Impaired tone, Decreased activity tolerance, Decreased coordination, Decreased strength, Postural dysfunction, Pain, Impaired flexibility  Visit Diagnosis: Muscle weakness (generalized)  Other abnormalities of gait and mobility  Other muscle spasm     Problem List Patient Active Problem List   Diagnosis Date Noted   Seizure ([place]) 11/11/2018   Major depressive disorder, recurrent episode, moderate ([place]) 02/07/2018   Nephrolithiasis 04/16/2016   Numbness 07/28/2015   Bladder retention 06/23/2015   Abdominal pain 06/04/2015   Dizziness  05/18/2015  Neck pain 22/57/5051   Complicated migraine 83/35/8251   Other fatigue 04/28/2015   Abnormal finding on MRI of brain 04/28/2015   D (diarrhea) 03/29/2015   H/O disease 03/29/2015   Abnormal weight loss 03/29/2015   Muscle weakness (generalized) 03/14/2015   Headache, migraine 03/10/2015   Janna Arch, PT, DPT  02/10/2021, 12:44 PM  [place] MAIN [facility] SERVICES [address] [place], [place], [address] Phone: [phone]   Fax:  [phone]  Name: Ronit Cranfield MRN: [id number] Date of Birth: 1980-06-01

## 2021-02-14 ENCOUNTER — Ambulatory Visit: Payer: BC Managed Care – PPO

## 2021-02-17 ENCOUNTER — Other Ambulatory Visit: Payer: Self-pay

## 2021-02-17 ENCOUNTER — Ambulatory Visit: Payer: BC Managed Care – PPO | Attending: Gastroenterology

## 2021-02-17 DIAGNOSIS — R269 Unspecified abnormalities of gait and mobility: Secondary | ICD-10-CM | POA: Diagnosis present

## 2021-02-17 DIAGNOSIS — R2689 Other abnormalities of gait and mobility: Secondary | ICD-10-CM | POA: Diagnosis present

## 2021-02-17 DIAGNOSIS — M6281 Muscle weakness (generalized): Secondary | ICD-10-CM | POA: Diagnosis not present

## 2021-02-17 DIAGNOSIS — M62838 Other muscle spasm: Secondary | ICD-10-CM | POA: Insufficient documentation

## 2021-02-17 NOTE — Therapy (Signed)
[place] MAIN [facility] SERVICES [address] [place], [place], [address] Phone: [phone]   Fax:  [phone]  Physical Therapy Treatment  Patient Details  Name: Christine Chavez MRN: [id number] Date of Birth: 12-20-1980 Referring Provider (PT): Elza Rafter   Encounter Date: 02/17/2021   PT End of Session - 02/17/21 1056     Visit Number 133    Number of Visits 144    Date for PT Re-Evaluation 03/03/21    Authorization Type 3/10 PN 01/31/21    Progress Note Due on Visit 130    PT Start Time 1100    PT Stop Time 1144    PT Time Calculation (min) 44 min    Equipment Utilized During Treatment Gait belt    Activity Tolerance Patient tolerated treatment well;No increased pain    Behavior During Therapy WFL for tasks assessed/performed             Past Medical History:  Diagnosis Date   Complication of anesthesia    ? seizures after anesthesia    Headache    Migraines    Neurogenic bladder    Renal disorder    Vision abnormalities     Past Surgical History:  Procedure Laterality Date   ANTERIOR CRUCIATE LIGAMENT REPAIR  1997   APPENDECTOMY     COLONOSCOPY WITH PROPOFOL N/A 11/11/2018   Procedure: COLONOSCOPY WITH PROPOFOL;  Surgeon: [name], MD;  Location: [facility] ENDOSCOPY;  Service: Endoscopy;  Laterality: N/A;   CYSTOSCOPY WITH STENT PLACEMENT Right 04/17/2016   Procedure: CYSTOSCOPY WITH STENT PLACEMENT;  Surgeon: [name], MD;  Location: [facility] ORS;  Service: Urology;  Laterality: Right;   ESOPHAGOGASTRODUODENOSCOPY (EGD) WITH PROPOFOL N/A 11/11/2018   Procedure: ESOPHAGOGASTRODUODENOSCOPY (EGD) WITH PROPOFOL;  Surgeon: [name], MD;  Location: [facility] ENDOSCOPY;  Service: Endoscopy;  Laterality: N/A;   EXPLORATORY LAPAROTOMY  1999   KIDNEY STONE SURGERY  04/2016   REVISION UROSTOMY CUTANEOUS     REVISION UROSTOMY CUTANEOUS  01/10/2018   SUPRAPUBIC CATHETER PLACEMENT  08/2017    TONSILLECTOMY      There were no vitals filed for this visit.   Subjective Assessment - 02/17/21 1230     Subjective Patient has recieved bilateral steroid injections in her hips. Reports no c/o of popping or pain in hips. Patient reports compliance with HEP. Has been having back pain.    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 Due to the rarity of her disorder it took years prior to diagnosis. 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  Patient has good days and bad days. Her good days allow for short ambulation (within room only not between rooms) and her bad days she is unable to ambulate.    Limitations Lifting;Standing;Walking;House hold activities;Other (comment)    How long can you stand comfortably? limited on bad days    How long can you walk comfortably? within room only on good days, unable on bad days    Patient Stated Goals to regain PLOF    Currently in Pain? Yes    Pain Score 5     Pain Location Back    Pain Orientation Lower;Upper    Pain Descriptors / Indicators Aching    Pain  Type Neuropathic pain    Pain Onset 1 to 4 weeks ago    Pain Frequency Constant                  Wheelchair  -negotiation of 5 cones with cues for tight turning, full turn/circling, keeping arms at side to reduce tension on R shoulder    Supine: -cervical side bend with overpressure at glenohumeral joint 20--30 seconds x 2 trials -cervical rotation with overpressure at glenohumeral joint 20-30 seconds x 2 trials -bridge 12x with focus on gluteal activation  Prone:  Hip flexor lengthening 30 seconds each LE Hamstring curls 12x each LE Modified hip extension with knees bent at 90  12x each LE    Standing: Hip extension with UE support on bar, stabilization to knees to prevent buckling; 10x each LE  ambulate 40 ft with rollator and w/c follow, increased knee buckling with fatigue,decreased weight acceptance onto RLE but increased trembling of LLE  Pt educated throughout session about proper posture and technique with exercises. Improved exercise technique, movement at target joints, use of target muscles after min to mod verbal, visual, tactile cues.    Patient presents with new orders from ortho in regards to her hips requesting ROM, strength/modalities, core strengthening, posterior chain activation/strength. Patient has an excellent session with little to no tone present allowing for standing strengthening interventions. Increased focus on posterior chain activation performed. Patient will continue to benefit from skilled PT to increase strength, functional mobility, activity tolerance and reduce pain to promote functional independence and improved quality of life.                  PT Education - 02/17/21 1057     Education Details exercise technique, wheelchai rmobility    Person(s) Educated Patient    Methods Explanation;Demonstration;Tactile cues;Verbal cues    Comprehension Verbalized understanding;Returned demonstration;Verbal cues required;Tactile cues required              PT Short Term Goals - 12/09/20 1201       PT SHORT TERM GOAL #1   Title Patient will demonstrate coordinated diaphragmatic breathing with pelvic tilts to demonstrate improved control of diaphragm and TA, to allow for further strengthening of core musculature and decreased pelvic floor spasm.    Baseline Pt. demonstrates breathing dysfunction and poor PFM coordination evidenced by anal manometry As of 2/22: Pt. continues to have restriction in her diaphragm and near T/L junction but is able to intentionally use diaphragmatic breathing through the available ROM.    Time 5     Period Weeks    Status Achieved    Target Date 04/20/19      PT SHORT TERM GOAL #2   Title Patient will report a reduction in pain to no greater than 6/10 over the prior week to demonstrate symptom improvement.    Baseline Pain is 10/10 at worst, 2/10 at best As of 2/22: Pt. had achieved this goal for 1 week as of 1/21 but may have infection or other insult that caused pain to increase again. As of 4/29: Pain high of 8/10 but has been over-doing her activity over the past week. As of 6/10: Pt. was able to keep pain at 6 or below since prior visit by using TENS to help keep tension lower but has not had a full week to test if it will keep working. As of 7/6: Pt. had met this goal but has a new UTI and it caused pain to spike  to 8/10 over the weekend. 8/25: 6/10 due to back pain    Time 5    Period Weeks    Status Achieved    Target Date 01/13/21      PT SHORT TERM GOAL #3   Title Patient will demonstrate HEP x1 in the clinic to demonstrate understanding and proper form to allow for further improvement.    Baseline Pt. lacks knowledge of therepeutic exercises that can decrease her pain/Sx.    Time 5    Period Weeks    Status Achieved    Target Date 04/20/19      PT SHORT TERM GOAL #4   Title Patient will report consistent use of foot-stool (squatty-potty) for positioning with BM to decrease pain with BM and intra-abdominal pressure.    Baseline Pt. having constipation due to PFM dysfunction    Time 5    Period Weeks    Status Achieved    Target Date 04/20/19               PT Long Term Goals - 01/31/21 0001       PT LONG TERM GOAL #1   Title Pt. will be able to participate in regular ADL's with least restrictive device without pain increasing greater than 2/10    Baseline Pt. limited in her ability to perform household duties by increased pain and fatigue. As of 4/29: Her legs have more endurance with walking before she gets a tremor. Still needs to sit down the majority of  the time she is cooking. Still needs back support for prolonged sitting. As of 6/10: Pt. able to do for  ~1 hour before she needs to rest to prevent increased pain, pain is averaging ~ 4-5/10 3/14: able to perform ADLs with less pain, does still have bad days 4/25: requires stops throughout process more fatigue than pain limiting 6/2: vas of 7/10 in pelvis occasionally 8/25: able to perform ADLs without >2/10 pain increase    Time 12    Period Weeks    Status Achieved      PT LONG TERM GOAL #2   Title Patient will increase 10 meter walk test to <30 seconds as to improve gait speed for better community ambulation and to reduce fall risk.    Baseline 6/2: 1 min 18 seconds with rollator 8/25: deferred due to excessive knee buckling during TUG 10/17: 1 min 38 seconds with frequent knee buckling    Time 12    Period Weeks    Status On-going    Target Date 03/03/21      PT LONG TERM GOAL #3   Title Patient (< 23 years old) will complete five times sit to stand test in < 20 seconds indicating an increased LE strength and improved balance.    Baseline 6/2: 43 seconds with heavy BUE support 8/25: 40 seconds with SUE support 10/17: 21 seconds with SUE support    Time 12    Period Weeks    Status Partially Met    Target Date 03/03/21      PT LONG TERM GOAL #4   Title Pt. will be able to perform 1 hr. of light housework reqiring intermittent standing and walking without increased pain/fatigue >2 pts.    Baseline Pt. becomes fatigued washing fruits/vegetbles in small stages and has fatigue>3 pts. 3/14: able to perform but needs rest breaks 4/25: able to do but is fatiguing 6/2: able to perform on good days; unable to perform on bad days  8/25: 4-5/10 10/17: 40% of the time    Time 12    Period Weeks    Status Partially Met    Target Date 03/03/21      PT LONG TERM GOAL #5   Title Patient will reduce timed up and go to <30 seconds to reduce fall risk and demonstrate improved transfer/gait ability.     Baseline 8/25: 1 min 47 seconds with rollator 10/17: 1 min 2 seconds    Time 12    Period Weeks    Status Partially Met    Target Date 03/03/21      PT LONG TERM GOAL #7   Title Patient will attend two consecutive athletic events for her children indicating improved capacity for activities and quality of life.    Baseline 8/25: can only attend one at a time 10/17: 1.5 seconds    Time 12    Period Weeks    Status Partially Met    Target Date 03/03/21                   Plan - 02/17/21 1255     Clinical Impression Statement Patient presents with new orders from ortho in regards to her hips requesting ROM, strength/modalities, core strengthening, posterior chain activation/strength. Patient has an excellent session with little to no tone present allowing for standing strengthening interventions. Increased focus on posterior chain activation performed. Patient will continue to benefit from skilled PT to increase strength, functional mobility, activity tolerance and reduce pain to promote functional independence and improved quality of life.    Personal Factors and Comorbidities Comorbidity 3+;Transportation;Time since onset of injury/illness/exacerbation;Finances    Comorbidities neurologic insult with pending diagnosis, multiple recurrent UTI's, BLE weakness, depression, hereditary spastic paraplegia    Examination-Activity Limitations Caring for Others;Carry;Continence;Squat;Stairs;Toileting;Locomotion Level;Lift;Stand;Bed Mobility;Bend;Reach Overhead;Sit;Dressing;Transfers    Examination-Participation Restrictions Church;Cleaning;Community Activity;Driving;Interpersonal Relationship;Laundry;[employer] Work;Meal Prep;School;Volunteer    Stability/Clinical Decision Making Unstable/Unpredictable    Rehab Potential Fair    PT Frequency 2x / week    PT Duration 12 weeks    PT Treatment/Interventions ADLs/Self Care Home Management;Aquatic Therapy;Biofeedback;Moist Heat;Electrical  Stimulation;Cryotherapy;Traction;Ultrasound;Therapeutic activities;Functional mobility training;Stair training;Gait training;Therapeutic exercise;Balance training;Neuromuscular re-education;Patient/family education;Manual techniques;Dry needling;Scar mobilization;Energy conservation;Taping;Joint Manipulations;Spinal Manipulations;Visual/perceptual remediation/compensation;Passive range of motion;Wheelchair mobility training;DME Instruction;Orthotic Fit/Training    PT Next Visit Plan transfers, stabilization in standing, core activation    PT Home Exercise Plan No updates today    Consulted and Agree with Plan of Care Patient             Patient will benefit from skilled therapeutic intervention in order to improve the following deficits and impairments:  Abnormal gait, Decreased balance, Decreased endurance, Decreased mobility, Difficulty walking, Increased muscle spasms, Impaired sensation, Improper body mechanics, Impaired tone, Decreased activity tolerance, Decreased coordination, Decreased strength, Postural dysfunction, Pain, Impaired flexibility  Visit Diagnosis: Muscle weakness (generalized)  Other abnormalities of gait and mobility  Other muscle spasm     Problem List Patient Active Problem List   Diagnosis Date Noted   Seizure ([place]) 11/11/2018   Major depressive disorder, recurrent episode, moderate ([place]) 02/07/2018   Nephrolithiasis 04/16/2016   Numbness 07/28/2015   Bladder retention 06/23/2015   Abdominal pain 06/04/2015   Dizziness 05/18/2015   Neck pain 19/62/2297   Complicated migraine 98/92/1194   Other fatigue 04/28/2015   Abnormal finding on MRI of brain 04/28/2015   D (diarrhea) 03/29/2015   H/O disease 03/29/2015   Abnormal weight loss 03/29/2015   Muscle weakness (generalized) 03/14/2015   Headache, migraine 03/10/2015  Janna Arch, PT, DPT  02/17/2021, 12:56 PM  [place] MAIN [facility] SERVICES [address]  [place], [place], [address] Phone: [phone]   Fax:  [phone]  Name: Christine Chavez MRN: [id number] Date of Birth: [birth year]

## 2021-02-21 ENCOUNTER — Other Ambulatory Visit: Payer: Self-pay

## 2021-02-21 ENCOUNTER — Ambulatory Visit: Payer: BC Managed Care – PPO

## 2021-02-21 DIAGNOSIS — M6281 Muscle weakness (generalized): Secondary | ICD-10-CM

## 2021-02-21 DIAGNOSIS — R269 Unspecified abnormalities of gait and mobility: Secondary | ICD-10-CM

## 2021-02-21 DIAGNOSIS — M62838 Other muscle spasm: Secondary | ICD-10-CM

## 2021-02-21 NOTE — Therapy (Signed)
[place] MAIN [facility] SERVICES [address] [place], [place], [address] Phone: [phone]   Fax:  [phone]  Physical Therapy Treatment  Patient Details  Name: Christine Chavez MRN: [id number] Date of Birth: May 20, 1980 Referring Provider (PT): Elza Rafter   Encounter Date: 02/21/2021   PT End of Session - 02/21/21 1120     Visit Number 134    Number of Visits 144    Date for PT Re-Evaluation 03/03/21    Authorization Type 3/10 PN 01/31/21    Progress Note Due on Visit 130    PT Start Time 1016    PT Stop Time 1100    PT Time Calculation (min) 44 min    Equipment Utilized During Treatment Gait belt    Activity Tolerance Patient tolerated treatment well;Patient limited by pain    Behavior During Therapy WFL for tasks assessed/performed             Past Medical History:  Diagnosis Date   Complication of anesthesia    ? seizures after anesthesia    Headache    Migraines    Neurogenic bladder    Renal disorder    Vision abnormalities     Past Surgical History:  Procedure Laterality Date   ANTERIOR CRUCIATE LIGAMENT REPAIR  1997   APPENDECTOMY     COLONOSCOPY WITH PROPOFOL N/A 11/11/2018   Procedure: COLONOSCOPY WITH PROPOFOL;  Surgeon: [name], MD;  Location: [facility] ENDOSCOPY;  Service: Endoscopy;  Laterality: N/A;   CYSTOSCOPY WITH STENT PLACEMENT Right 04/17/2016   Procedure: CYSTOSCOPY WITH STENT PLACEMENT;  Surgeon: [name], MD;  Location: [facility] ORS;  Service: Urology;  Laterality: Right;   ESOPHAGOGASTRODUODENOSCOPY (EGD) WITH PROPOFOL N/A 11/11/2018   Procedure: ESOPHAGOGASTRODUODENOSCOPY (EGD) WITH PROPOFOL;  Surgeon: [name], MD;  Location: [facility] ENDOSCOPY;  Service: Endoscopy;  Laterality: N/A;   EXPLORATORY LAPAROTOMY  1999   KIDNEY STONE SURGERY  04/2016   REVISION UROSTOMY CUTANEOUS     REVISION UROSTOMY CUTANEOUS  01/10/2018   SUPRAPUBIC CATHETER PLACEMENT  08/2017    TONSILLECTOMY      There were no vitals filed for this visit.   Subjective Assessment - 02/21/21 1018     Subjective Patient reports overdoing it on Friday and "paid the price" on Saturday. She spent a lot of time running errands in her new wheelchair and had to do most of the pushing by herself then felt spasms and tightness all along her paraspinals over the weekend, left worse than right.    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 Due to the rarity of her disorder it took years prior to diagnosis. 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  Patient has good days and bad days. Her good days allow for short ambulation (within room only not between rooms) and her bad days she is unable to ambulate.    Limitations Lifting;Standing;Walking;House hold activities;Other (comment)    How long can you stand comfortably? limited on bad days    How long can you walk comfortably? within room only on good days, unable on bad days    Patient Stated Goals to regain PLOF    Currently in Pain? Yes    Pain Score 6  Pain Location Back    Pain Orientation Left    Pain Onset 1 to 4 weeks ago              TherEx:  Quad sets with towel beneath knee to avoid hyperextension, x5sec holds x10 each side.  Bilateral knee fallouts, supine x60sec for mobility and pain management. GTB hooklying resisted hip ER/knee fallouts 2x10 each side. Cuing for digging heels into table for stability and exhale on effort.  Quad sets x5 sec holds x10 each side with towel roll beneath knee to prevent hyperextension. Added to HEP Modified bridging with PPT and light resistance added by SPT to bilateral knees to cue  isometric abduction. X10 with 4 sec holds at top. Sidelying open book stretch x10 with 3 sec pause. Cuing for exhale on rotation. Gentle oscillations at hip added by SPT to promote relaxation.      Manual: Patient laying on lumbar heat pack Upper trap stretch 30 seconds x 3 trials  Active cervical side bending with Manual depression of left shoulder added by SPT.   Pt educated throughout session about proper posture and technique with exercises. Improved exercise technique, movement at target joints, use of target muscles after min to mod verbal, visual, tactile cues.    Patient presented to PT today with increased reports of bask spasms due to increased activity over the weekend. Session focused on pain reduction, mobility, and gentle LE strengthening on the Mat table since she has a long event to attend for her children tonight. Patient tolerated hip strengthening TherEx well and reported decreased back pain by the end of session (4/10). Patient will continue to benefit from skilled PT to improve strength, mobility, manage pain, and improve safety and independence with ADLs.              PT Education - 02/21/21 1119     Education Details Exercise technique, HEP addition    Person(s) Educated Patient    Methods Explanation;Demonstration;Tactile cues;Verbal cues    Comprehension Verbalized understanding;Returned demonstration;Verbal cues required;Tactile cues required              PT Short Term Goals - 12/09/20 1201       PT SHORT TERM GOAL #1   Title Patient will demonstrate coordinated diaphragmatic breathing with pelvic tilts to demonstrate improved control of diaphragm and TA, to allow for further strengthening of core musculature and decreased pelvic floor spasm.    Baseline Pt. demonstrates breathing dysfunction and poor PFM coordination evidenced by anal manometry As of 2/22: Pt. continues to have restriction in her diaphragm and near T/L junction but is able to  intentionally use diaphragmatic breathing through the available ROM.    Time 5    Period Weeks    Status Achieved    Target Date 04/20/19      PT SHORT TERM GOAL #2   Title Patient will report a reduction in pain to no greater than 6/10 over the prior week to demonstrate symptom improvement.    Baseline Pain is 10/10 at worst, 2/10 at best As of 2/22: Pt. had achieved this goal for 1 week as of 1/21 but may have infection or other insult that caused pain to increase again. As of 4/29: Pain high of 8/10 but has been over-doing her activity over the past week. As of 6/10: Pt. was able to keep pain at 6 or below since prior visit by using TENS to help keep tension lower but has not had a full  week to test if it will keep working. As of 7/6: Pt. had met this goal but has a new UTI and it caused pain to spike to 8/10 over the weekend. 8/25: 6/10 due to back pain    Time 5    Period Weeks    Status Achieved    Target Date 01/13/21      PT SHORT TERM GOAL #3   Title Patient will demonstrate HEP x1 in the clinic to demonstrate understanding and proper form to allow for further improvement.    Baseline Pt. lacks knowledge of therepeutic exercises that can decrease her pain/Sx.    Time 5    Period Weeks    Status Achieved    Target Date 04/20/19      PT SHORT TERM GOAL #4   Title Patient will report consistent use of foot-stool (squatty-potty) for positioning with BM to decrease pain with BM and intra-abdominal pressure.    Baseline Pt. having constipation due to PFM dysfunction    Time 5    Period Weeks    Status Achieved    Target Date 04/20/19               PT Long Term Goals - 01/31/21 0001       PT LONG TERM GOAL #1   Title Pt. will be able to participate in regular ADL's with least restrictive device without pain increasing greater than 2/10    Baseline Pt. limited in her ability to perform household duties by increased pain and fatigue. As of 4/29: Her legs have more endurance  with walking before she gets a tremor. Still needs to sit down the majority of the time she is cooking. Still needs back support for prolonged sitting. As of 6/10: Pt. able to do for  ~1 hour before she needs to rest to prevent increased pain, pain is averaging ~ 4-5/10 3/14: able to perform ADLs with less pain, does still have bad days 4/25: requires stops throughout process more fatigue than pain limiting 6/2: vas of 7/10 in pelvis occasionally 8/25: able to perform ADLs without >2/10 pain increase    Time 12    Period Weeks    Status Achieved      PT LONG TERM GOAL #2   Title Patient will increase 10 meter walk test to <30 seconds as to improve gait speed for better community ambulation and to reduce fall risk.    Baseline 6/2: 1 min 18 seconds with rollator 8/25: deferred due to excessive knee buckling during TUG 10/17: 1 min 38 seconds with frequent knee buckling    Time 12    Period Weeks    Status On-going    Target Date 03/03/21      PT LONG TERM GOAL #3   Title Patient (< 33 years old) will complete five times sit to stand test in < 20 seconds indicating an increased LE strength and improved balance.    Baseline 6/2: 43 seconds with heavy BUE support 8/25: 40 seconds with SUE support 10/17: 21 seconds with SUE support    Time 12    Period Weeks    Status Partially Met    Target Date 03/03/21      PT LONG TERM GOAL #4   Title Pt. will be able to perform 1 hr. of light housework reqiring intermittent standing and walking without increased pain/fatigue >2 pts.    Baseline Pt. becomes fatigued washing fruits/vegetbles in small stages and has fatigue>3 pts. 3/14:  able to perform but needs rest breaks 4/25: able to do but is fatiguing 6/2: able to perform on good days; unable to perform on bad days 8/25: 4-5/10 10/17: 40% of the time    Time 12    Period Weeks    Status Partially Met    Target Date 03/03/21      PT LONG TERM GOAL #5   Title Patient will reduce timed up and go to <30  seconds to reduce fall risk and demonstrate improved transfer/gait ability.    Baseline 8/25: 1 min 47 seconds with rollator 10/17: 1 min 2 seconds    Time 12    Period Weeks    Status Partially Met    Target Date 03/03/21      PT LONG TERM GOAL #7   Title Patient will attend two consecutive athletic events for her children indicating improved capacity for activities and quality of life.    Baseline 8/25: can only attend one at a time 10/17: 1.5 seconds    Time 12    Period Weeks    Status Partially Met    Target Date 03/03/21                   Plan - 02/21/21 1121     Clinical Impression Statement Patient presented to PT today with increased reports of bask spasms due to increased activity over the weekend. Session focused on pain reduction, mobility, and gentle LE strengthening on the Mat table since she has a long event to attend for her children tonight. Patient tolerated hip strengthening TherEx well and reported decreased back pain by the end of session (4/10). Patient will continue to benefit from skilled PT to improve strength, mobility, manage pain, and improve safety and independence with ADLs.    Personal Factors and Comorbidities Comorbidity 3+;Transportation;Time since onset of injury/illness/exacerbation;Finances    Comorbidities neurologic insult with pending diagnosis, multiple recurrent UTI's, BLE weakness, depression, hereditary spastic paraplegia    Examination-Activity Limitations Caring for Others;Carry;Continence;Squat;Stairs;Toileting;Locomotion Level;Lift;Stand;Bed Mobility;Bend;Reach Overhead;Sit;Dressing;Transfers    Examination-Participation Restrictions Church;Cleaning;Community Activity;Driving;Interpersonal Relationship;Laundry;[employer] Work;Meal Prep;School;Volunteer    Stability/Clinical Decision Making Unstable/Unpredictable    Rehab Potential Fair    PT Frequency 2x / week    PT Duration 12 weeks    PT Treatment/Interventions ADLs/Self Care Home  Management;Aquatic Therapy;Biofeedback;Moist Heat;Electrical Stimulation;Cryotherapy;Traction;Ultrasound;Therapeutic activities;Functional mobility training;Stair training;Gait training;Therapeutic exercise;Balance training;Neuromuscular re-education;Patient/family education;Manual techniques;Dry needling;Scar mobilization;Energy conservation;Taping;Joint Manipulations;Spinal Manipulations;Visual/perceptual remediation/compensation;Passive range of motion;Wheelchair mobility training;DME Instruction;Orthotic Fit/Training    PT Next Visit Plan transfers, stabilization in standing, core activation    PT Home Exercise Plan Added Quad sets;Access Code: [email or web address] URL: [email or web address]    Consulted and Agree with Plan of Care Patient             Patient will benefit from skilled therapeutic intervention in order to improve the following deficits and impairments:  Abnormal gait, Decreased balance, Decreased endurance, Decreased mobility, Difficulty walking, Increased muscle spasms, Impaired sensation, Improper body mechanics, Impaired tone, Decreased activity tolerance, Decreased coordination, Decreased strength, Postural dysfunction, Pain, Impaired flexibility  Visit Diagnosis: Other muscle spasm  Muscle weakness (generalized)  Abnormality of gait and mobility     Problem List Patient Active Problem List   Diagnosis Date Noted   Seizure ([place]) 11/11/2018   Major depressive disorder, recurrent episode, moderate ([place]) 02/07/2018   Nephrolithiasis 04/16/2016   Numbness 07/28/2015   Bladder retention 06/23/2015   Abdominal pain 06/04/2015   Dizziness 05/18/2015   Neck pain 85/92/9244   Complicated migraine 62/86/3817  Other fatigue 04/28/2015   Abnormal finding on MRI of brain 04/28/2015   D (diarrhea) 03/29/2015   H/O disease 03/29/2015   Abnormal weight loss 03/29/2015   Muscle weakness (generalized) 03/14/2015   Headache, migraine 03/10/2015   Arsenio Katz,  SPT  This entire session was performed under direct supervision and direction of a licensed therapist/therapist assistant . I have personally read, edited and approve of the note as written.  Janna Arch, PT, DPT  02/21/2021, 11:27 AM  [place] MAIN [facility] SERVICES [address] [place], [place], [address] Phone: [phone]   Fax:  [phone]  Name: Geet Hosking MRN: [id number] Date of Birth: 1980-06-15

## 2021-02-24 ENCOUNTER — Other Ambulatory Visit: Payer: Self-pay

## 2021-02-24 ENCOUNTER — Ambulatory Visit: Payer: BC Managed Care – PPO

## 2021-02-24 DIAGNOSIS — M6281 Muscle weakness (generalized): Secondary | ICD-10-CM | POA: Diagnosis not present

## 2021-02-24 DIAGNOSIS — M62838 Other muscle spasm: Secondary | ICD-10-CM

## 2021-02-24 DIAGNOSIS — R2689 Other abnormalities of gait and mobility: Secondary | ICD-10-CM

## 2021-02-24 NOTE — Therapy (Signed)
[place] MAIN [facility] SERVICES [address] [place], [place], [address] Phone: [phone]   Fax:  [phone]  Physical Therapy Treatment  Patient Details  Name: Christine Chavez MRN: [id number] Date of Birth: Jun 23, 1980 Referring Provider (PT): Elza Rafter   Encounter Date: 02/24/2021   PT End of Session - 02/24/21 1112     Visit Number 135    Number of Visits 144    Date for PT Re-Evaluation 03/03/21    Authorization Type 3/10 PN 01/31/21    Progress Note Due on Visit 130    PT Start Time 1116    PT Stop Time 1200    PT Time Calculation (min) 44 min    Equipment Utilized During Treatment Gait belt    Activity Tolerance Patient tolerated treatment well;Patient limited by pain    Behavior During Therapy WFL for tasks assessed/performed             Past Medical History:  Diagnosis Date   Complication of anesthesia    ? seizures after anesthesia    Headache    Migraines    Neurogenic bladder    Renal disorder    Vision abnormalities     Past Surgical History:  Procedure Laterality Date   ANTERIOR CRUCIATE LIGAMENT REPAIR  1997   APPENDECTOMY     COLONOSCOPY WITH PROPOFOL N/A 11/11/2018   Procedure: COLONOSCOPY WITH PROPOFOL;  Surgeon: [name], MD;  Location: [facility] ENDOSCOPY;  Service: Endoscopy;  Laterality: N/A;   CYSTOSCOPY WITH STENT PLACEMENT Right 04/17/2016   Procedure: CYSTOSCOPY WITH STENT PLACEMENT;  Surgeon: [name], MD;  Location: [facility] ORS;  Service: Urology;  Laterality: Right;   ESOPHAGOGASTRODUODENOSCOPY (EGD) WITH PROPOFOL N/A 11/11/2018   Procedure: ESOPHAGOGASTRODUODENOSCOPY (EGD) WITH PROPOFOL;  Surgeon: [name], MD;  Location: [facility] ENDOSCOPY;  Service: Endoscopy;  Laterality: N/A;   EXPLORATORY LAPAROTOMY  1999   KIDNEY STONE SURGERY  04/2016   REVISION UROSTOMY CUTANEOUS     REVISION UROSTOMY CUTANEOUS  01/10/2018   SUPRAPUBIC CATHETER PLACEMENT  08/2017    TONSILLECTOMY      There were no vitals filed for this visit.    Treatment:   TherEx  Lower Trunk rotation x10 Quad sets with towel beneath knee to avoid hyperextension 1x10 each LE Glute strengthening: Bridging 1x12, cuing for driving through heels and squeezing glutes at top with 3sec hold. Prone Hamstring curls 1x10 each side with SPT providing mild resistance. Prone Donkey kicks 1x10 each side Deadbugs with green SB  1x10 with 5 sec hold, cuing for prolonged exhale during contractoin. Seated Rows with RTB 1x12, cuing for maintaining independence upright posture. Passive hamstring stretch 2x30sec each LE while patient laid on moist heat pack. Pt reported decrease in pain at end of session. Prone posterior sling/Superman assessment demonstrated moderate weakness in posterior chain.  Patient reported mid-low back pain at beginning of session; percussion test was performed bilaterally and patient reported mild increase in pain with percussion to left mid flank, however it was not a sharp, radiating, intense increase. Palpation revealed increased tension in left paraspinals compared to right. PT advised that symptoms appear to be more in line with MSK pain versus kidney involvement, however encouraged pt to follow-up with MD, especially if symptoms worsen.  Pt educated throughout session about proper posture and technique with exercises. Improved exercise technique, movement at target joints, use of target muscles after min to mod verbal, visual, tactile cues.   Patient presents to  PT with increased mid-low back pain but still gave good effort throughout. Pt tolerated glute strengthening well and demonstrated improved quad control and hamstring activation. Renal percussion test was performed and provoked mild increase in pain with left percussion, but was not sharp or radiating; likely indicating MSK vs kidney involvement. However, patient was still advised to follow up with MD/PCP  concerning flank pain, especially if it worsens, given possible kidney involvement.  Pt will continue to benefit from skilled PT to improve endurance, strength, and body mechanics to increase safety and independence.                       PT Short Term Goals - 12/09/20 1201       PT SHORT TERM GOAL #1   Title Patient will demonstrate coordinated diaphragmatic breathing with pelvic tilts to demonstrate improved control of diaphragm and TA, to allow for further strengthening of core musculature and decreased pelvic floor spasm.    Baseline Pt. demonstrates breathing dysfunction and poor PFM coordination evidenced by anal manometry As of 2/22: Pt. continues to have restriction in her diaphragm and near T/L junction but is able to intentionally use diaphragmatic breathing through the available ROM.    Time 5    Period Weeks    Status Achieved    Target Date 04/20/19      PT SHORT TERM GOAL #2   Title Patient will report a reduction in pain to no greater than 6/10 over the prior week to demonstrate symptom improvement.    Baseline Pain is 10/10 at worst, 2/10 at best As of 2/22: Pt. had achieved this goal for 1 week as of 1/21 but may have infection or other insult that caused pain to increase again. As of 4/29: Pain high of 8/10 but has been over-doing her activity over the past week. As of 6/10: Pt. was able to keep pain at 6 or below since prior visit by using TENS to help keep tension lower but has not had a full week to test if it will keep working. As of 7/6: Pt. had met this goal but has a new UTI and it caused pain to spike to 8/10 over the weekend. 8/25: 6/10 due to back pain    Time 5    Period Weeks    Status Achieved    Target Date 01/13/21      PT SHORT TERM GOAL #3   Title Patient will demonstrate HEP x1 in the clinic to demonstrate understanding and proper form to allow for further improvement.    Baseline Pt. lacks knowledge of therepeutic exercises that can  decrease her pain/Sx.    Time 5    Period Weeks    Status Achieved    Target Date 04/20/19      PT SHORT TERM GOAL #4   Title Patient will report consistent use of foot-stool (squatty-potty) for positioning with BM to decrease pain with BM and intra-abdominal pressure.    Baseline Pt. having constipation due to PFM dysfunction    Time 5    Period Weeks    Status Achieved    Target Date 04/20/19               PT Long Term Goals - 01/31/21 0001       PT LONG TERM GOAL #1   Title Pt. will be able to participate in regular ADL's with least restrictive device without pain increasing greater than 2/10  Baseline Pt. limited in her ability to perform household duties by increased pain and fatigue. As of 4/29: Her legs have more endurance with walking before she gets a tremor. Still needs to sit down the majority of the time she is cooking. Still needs back support for prolonged sitting. As of 6/10: Pt. able to do for  ~1 hour before she needs to rest to prevent increased pain, pain is averaging ~ 4-5/10 3/14: able to perform ADLs with less pain, does still have bad days 4/25: requires stops throughout process more fatigue than pain limiting 6/2: vas of 7/10 in pelvis occasionally 8/25: able to perform ADLs without >2/10 pain increase    Time 12    Period Weeks    Status Achieved      PT LONG TERM GOAL #2   Title Patient will increase 10 meter walk test to <30 seconds as to improve gait speed for better community ambulation and to reduce fall risk.    Baseline 6/2: 1 min 18 seconds with rollator 8/25: deferred due to excessive knee buckling during TUG 10/17: 1 min 38 seconds with frequent knee buckling    Time 12    Period Weeks    Status On-going    Target Date 03/03/21      PT LONG TERM GOAL #3   Title Patient (< 61 years old) will complete five times sit to stand test in < 20 seconds indicating an increased LE strength and improved balance.    Baseline 6/2: 43 seconds with heavy  BUE support 8/25: 40 seconds with SUE support 10/17: 21 seconds with SUE support    Time 12    Period Weeks    Status Partially Met    Target Date 03/03/21      PT LONG TERM GOAL #4   Title Pt. will be able to perform 1 hr. of light housework reqiring intermittent standing and walking without increased pain/fatigue >2 pts.    Baseline Pt. becomes fatigued washing fruits/vegetbles in small stages and has fatigue>3 pts. 3/14: able to perform but needs rest breaks 4/25: able to do but is fatiguing 6/2: able to perform on good days; unable to perform on bad days 8/25: 4-5/10 10/17: 40% of the time    Time 12    Period Weeks    Status Partially Met    Target Date 03/03/21      PT LONG TERM GOAL #5   Title Patient will reduce timed up and go to <30 seconds to reduce fall risk and demonstrate improved transfer/gait ability.    Baseline 8/25: 1 min 47 seconds with rollator 10/17: 1 min 2 seconds    Time 12    Period Weeks    Status Partially Met    Target Date 03/03/21      PT LONG TERM GOAL #7   Title Patient will attend two consecutive athletic events for her children indicating improved capacity for activities and quality of life.    Baseline 8/25: can only attend one at a time 10/17: 1.5 seconds    Time 12    Period Weeks    Status Partially Met    Target Date 03/03/21                    Patient will benefit from skilled therapeutic intervention in order to improve the following deficits and impairments:     Visit Diagnosis: No diagnosis found.     Problem List Patient Active Problem List  Diagnosis Date Noted   Seizure ([place]) 11/11/2018   Major depressive disorder, recurrent episode, moderate ([place]) 02/07/2018   Nephrolithiasis 04/16/2016   Numbness 07/28/2015   Bladder retention 06/23/2015   Abdominal pain 06/04/2015   Dizziness 05/18/2015   Neck pain 15/40/0867   Complicated migraine 61/95/0932   Other fatigue 04/28/2015   Abnormal finding on MRI of brain  04/28/2015   D (diarrhea) 03/29/2015   H/O disease 03/29/2015   Abnormal weight loss 03/29/2015   Muscle weakness (generalized) 03/14/2015   Headache, migraine 03/10/2015   Arsenio Katz, SPT   This entire session was performed under direct supervision and direction of a licensed therapist/therapist assistant . I have personally read, edited and approve of the note as written.  Janna Arch, PT, DPT  02/24/2021, 12:04 PM  [place] MAIN [facility] SERVICES [address] [place], [place], [address] Phone: [phone]   Fax:  [phone]  Name: Christine Chavez MRN: [id number] Date of Birth: Aug 30, 1980

## 2021-02-28 ENCOUNTER — Other Ambulatory Visit: Payer: Self-pay

## 2021-02-28 ENCOUNTER — Ambulatory Visit: Payer: BC Managed Care – PPO

## 2021-02-28 DIAGNOSIS — M6281 Muscle weakness (generalized): Secondary | ICD-10-CM | POA: Diagnosis not present

## 2021-02-28 DIAGNOSIS — M62838 Other muscle spasm: Secondary | ICD-10-CM

## 2021-02-28 DIAGNOSIS — R269 Unspecified abnormalities of gait and mobility: Secondary | ICD-10-CM

## 2021-02-28 NOTE — Therapy (Signed)
[place] MAIN [facility] SERVICES [address] [place], [place], [address] Phone: [phone]   Fax:  [phone]  Physical Therapy Treatment  Patient Details  Name: Christine Chavez MRN: [id number] Date of Birth: Jun 03, 1980 Referring Provider (PT): Elza Rafter   Encounter Date: 02/28/2021   PT End of Session - 02/28/21 1017     Visit Number 136    Number of Visits 144    Date for PT Re-Evaluation 03/03/21    Authorization Type 3/10 PN 01/31/21    Progress Note Due on Visit 130    PT Start Time 1017    PT Stop Time 1100    PT Time Calculation (min) 43 min    Equipment Utilized During Treatment Gait belt    Activity Tolerance Patient tolerated treatment well;Patient limited by pain    Behavior During Therapy WFL for tasks assessed/performed             Past Medical History:  Diagnosis Date   Complication of anesthesia    ? seizures after anesthesia    Headache    Migraines    Neurogenic bladder    Renal disorder    Vision abnormalities     Past Surgical History:  Procedure Laterality Date   ANTERIOR CRUCIATE LIGAMENT REPAIR  1997   APPENDECTOMY     COLONOSCOPY WITH PROPOFOL N/A 11/11/2018   Procedure: COLONOSCOPY WITH PROPOFOL;  Surgeon: [name], MD;  Location: [facility] ENDOSCOPY;  Service: Endoscopy;  Laterality: N/A;   CYSTOSCOPY WITH STENT PLACEMENT Right 04/17/2016   Procedure: CYSTOSCOPY WITH STENT PLACEMENT;  Surgeon: [name], MD;  Location: [facility] ORS;  Service: Urology;  Laterality: Right;   ESOPHAGOGASTRODUODENOSCOPY (EGD) WITH PROPOFOL N/A 11/11/2018   Procedure: ESOPHAGOGASTRODUODENOSCOPY (EGD) WITH PROPOFOL;  Surgeon: [name], MD;  Location: [facility] ENDOSCOPY;  Service: Endoscopy;  Laterality: N/A;   EXPLORATORY LAPAROTOMY  1999   KIDNEY STONE SURGERY  04/2016   REVISION UROSTOMY CUTANEOUS     REVISION UROSTOMY CUTANEOUS  01/10/2018   SUPRAPUBIC CATHETER PLACEMENT  08/2017    TONSILLECTOMY      There were no vitals filed for this visit.   Subjective Assessment - 02/28/21 1018     Subjective Patient reports significant back pain and fatigue from weekend. Was 8/10 on Saturday, radiating up toward neck and started getting a migraine yesterday. Denies current migraine or headache but reports 5/10 back pain. She reports increased baclofen dosage per MD visit on Friday and was cleared for potential hip surgery by Neurologist, but is unsure if she wants to proceed with procedure or not.    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 Due to the rarity of her disorder it took years prior to diagnosis. 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  Patient has good days and bad days. Her good days allow for short ambulation (within room only not between rooms) and her bad days she is unable to ambulate.    Limitations Lifting;Standing;Walking;House hold activities;Other (comment)    How long can you stand comfortably? limited on bad days    How long can you walk comfortably? within room only on good days, unable on bad days    Patient Stated Goals to regain PLOF  Currently in Pain? Yes    Pain Score 5     Pain Location Back    Pain Orientation Lower    Pain Descriptors / Indicators Aching    Pain Onset 1 to 4 weeks ago             Neuro Re-Ed: Seated core balance on airex x45-60sec holds, cuing for scapular engagement and upright posture.  2x static sitting, 1x forward press, cuing for scapular retraction at end. TKE with GTB with UE support at support surface. Manual cuing and blocking by SPT to prevent hyperextension thrust. 1x12 each LE SL stance with toe taps to  side and back 1x10 each LE   TherEx: Lower trunk rotation 1x20 Supine Hamstring stretch with SPT overpressure 2x60sec each LE Bridges with GTB around knees 1x15, cuing for drive through heels and active abduction and 3" hold. Bridges with GTB around knees and marching 1x8, discontinued due to increased back pain. Prone Alternating Hip extensions 1x12 each leg Prone Donkey kicks 1x10 each leg   Pt educated throughout session about proper posture and technique with exercises. Improved exercise technique, movement at target joints, use of target muscles after min to mod verbal, visual, tactile cues.     Patient reported to PT with increased back spasms and pain, 5/10. Patient tolerated standing Neuro Re-ed well however significant knee wobble is noted, especially in LLE, single limb stance and as fatigue increases. Patient is demonstrating increased glute strength during prone and supine TherEx and will benefit from continuing to progress core strengthening, SL stance, and balance training. Pt will benefit from skilled PT to improve strength, stability, and decrease pain to improve safety and independence with functional mobility and quality of life.                    PT Education - 02/28/21 1114     Education Details Exercise technique    Person(s) Educated Patient    Methods Explanation;Demonstration;Tactile cues;Verbal cues    Comprehension Verbalized understanding;Returned demonstration;Verbal cues required;Tactile cues required              PT Short Term Goals - 12/09/20 1201       PT SHORT TERM GOAL #1   Title Patient will demonstrate coordinated diaphragmatic breathing with pelvic tilts to demonstrate improved control of diaphragm and TA, to allow for further strengthening of core musculature and decreased pelvic floor spasm.    Baseline Pt. demonstrates breathing dysfunction and poor PFM coordination evidenced by anal manometry As of 2/22: Pt. continues to  have restriction in her diaphragm and near T/L junction but is able to intentionally use diaphragmatic breathing through the available ROM.    Time 5    Period Weeks    Status Achieved    Target Date 04/20/19      PT SHORT TERM GOAL #2   Title Patient will report a reduction in pain to no greater than 6/10 over the prior week to demonstrate symptom improvement.    Baseline Pain is 10/10 at worst, 2/10 at best As of 2/22: Pt. had achieved this goal for 1 week as of 1/21 but may have infection or other insult that caused pain to increase again. As of 4/29: Pain high of 8/10 but has been over-doing her activity over the past week. As of 6/10: Pt. was able to keep pain at 6 or below since prior visit by using TENS to help keep tension lower but has not had a full  week to test if it will keep working. As of 7/6: Pt. had met this goal but has a new UTI and it caused pain to spike to 8/10 over the weekend. 8/25: 6/10 due to back pain    Time 5    Period Weeks    Status Achieved    Target Date 01/13/21      PT SHORT TERM GOAL #3   Title Patient will demonstrate HEP x1 in the clinic to demonstrate understanding and proper form to allow for further improvement.    Baseline Pt. lacks knowledge of therepeutic exercises that can decrease her pain/Sx.    Time 5    Period Weeks    Status Achieved    Target Date 04/20/19      PT SHORT TERM GOAL #4   Title Patient will report consistent use of foot-stool (squatty-potty) for positioning with BM to decrease pain with BM and intra-abdominal pressure.    Baseline Pt. having constipation due to PFM dysfunction    Time 5    Period Weeks    Status Achieved    Target Date 04/20/19               PT Long Term Goals - 01/31/21 0001       PT LONG TERM GOAL #1   Title Pt. will be able to participate in regular ADL's with least restrictive device without pain increasing greater than 2/10    Baseline Pt. limited in her ability to perform household duties  by increased pain and fatigue. As of 4/29: Her legs have more endurance with walking before she gets a tremor. Still needs to sit down the majority of the time she is cooking. Still needs back support for prolonged sitting. As of 6/10: Pt. able to do for  ~1 hour before she needs to rest to prevent increased pain, pain is averaging ~ 4-5/10 3/14: able to perform ADLs with less pain, does still have bad days 4/25: requires stops throughout process more fatigue than pain limiting 6/2: vas of 7/10 in pelvis occasionally 8/25: able to perform ADLs without >2/10 pain increase    Time 12    Period Weeks    Status Achieved      PT LONG TERM GOAL #2   Title Patient will increase 10 meter walk test to <30 seconds as to improve gait speed for better community ambulation and to reduce fall risk.    Baseline 6/2: 1 min 18 seconds with rollator 8/25: deferred due to excessive knee buckling during TUG 10/17: 1 min 38 seconds with frequent knee buckling    Time 12    Period Weeks    Status On-going    Target Date 03/03/21      PT LONG TERM GOAL #3   Title Patient (< 38 years old) will complete five times sit to stand test in < 20 seconds indicating an increased LE strength and improved balance.    Baseline 6/2: 43 seconds with heavy BUE support 8/25: 40 seconds with SUE support 10/17: 21 seconds with SUE support    Time 12    Period Weeks    Status Partially Met    Target Date 03/03/21      PT LONG TERM GOAL #4   Title Pt. will be able to perform 1 hr. of light housework reqiring intermittent standing and walking without increased pain/fatigue >2 pts.    Baseline Pt. becomes fatigued washing fruits/vegetbles in small stages and has fatigue>3 pts. 3/14:  able to perform but needs rest breaks 4/25: able to do but is fatiguing 6/2: able to perform on good days; unable to perform on bad days 8/25: 4-5/10 10/17: 40% of the time    Time 12    Period Weeks    Status Partially Met    Target Date 03/03/21       PT LONG TERM GOAL #5   Title Patient will reduce timed up and go to <30 seconds to reduce fall risk and demonstrate improved transfer/gait ability.    Baseline 8/25: 1 min 47 seconds with rollator 10/17: 1 min 2 seconds    Time 12    Period Weeks    Status Partially Met    Target Date 03/03/21      PT LONG TERM GOAL #7   Title Patient will attend two consecutive athletic events for her children indicating improved capacity for activities and quality of life.    Baseline 8/25: can only attend one at a time 10/17: 1.5 seconds    Time 12    Period Weeks    Status Partially Met    Target Date 03/03/21                   Plan - 02/28/21 1115     Clinical Impression Statement Patient reported to PT with increased back spasms and pain, 5/10. Patient tolerated standing Neuro Re-ed well however significant knee wobble is noted, especially in LLE, single limb stance and as fatigue increases. Patient is demonstrating increased glute strength during prone and supine TherEx and will benefit from continuing to progress core strengthening, SL stance, and balance training. Pt will benefit from skilled PT to improve strength, stability, and decrease pain to improve safety and independence with functional mobility and quality of life.    Personal Factors and Comorbidities Comorbidity 3+;Transportation;Time since onset of injury/illness/exacerbation;Finances    Comorbidities neurologic insult with pending diagnosis, multiple recurrent UTI's, BLE weakness, depression, hereditary spastic paraplegia    Examination-Activity Limitations Caring for Others;Carry;Continence;Squat;Stairs;Toileting;Locomotion Level;Lift;Stand;Bed Mobility;Bend;Reach Overhead;Sit;Dressing;Transfers    Examination-Participation Restrictions Church;Cleaning;Community Activity;Driving;Interpersonal Relationship;Laundry;[employer] Work;Meal Prep;School;Volunteer    Stability/Clinical Decision Making Unstable/Unpredictable    Rehab  Potential Fair    PT Frequency 2x / week    PT Duration 12 weeks    PT Treatment/Interventions ADLs/Self Care Home Management;Aquatic Therapy;Biofeedback;Moist Heat;Electrical Stimulation;Cryotherapy;Traction;Ultrasound;Therapeutic activities;Functional mobility training;Stair training;Gait training;Therapeutic exercise;Balance training;Neuromuscular re-education;Patient/family education;Manual techniques;Dry needling;Scar mobilization;Energy conservation;Taping;Joint Manipulations;Spinal Manipulations;Visual/perceptual remediation/compensation;Passive range of motion;Wheelchair mobility training;DME Instruction;Orthotic Fit/Training    PT Next Visit Plan transfers, stabilization in standing, core activation    PT Home Exercise Plan Added Quad sets;Access Code: [email or web address] URL: [email or web address]    Consulted and Agree with Plan of Care Patient             Patient will benefit from skilled therapeutic intervention in order to improve the following deficits and impairments:  Abnormal gait, Decreased balance, Decreased endurance, Decreased mobility, Difficulty walking, Increased muscle spasms, Impaired sensation, Improper body mechanics, Impaired tone, Decreased activity tolerance, Decreased coordination, Decreased strength, Postural dysfunction, Pain, Impaired flexibility  Visit Diagnosis: Muscle weakness (generalized)  Other muscle spasm  Abnormality of gait and mobility     Problem List Patient Active Problem List   Diagnosis Date Noted   Seizure ([place]) 11/11/2018   Major depressive disorder, recurrent episode, moderate ([place]) 02/07/2018   Nephrolithiasis 04/16/2016   Numbness 07/28/2015   Bladder retention 06/23/2015   Abdominal pain 06/04/2015   Dizziness 05/18/2015   Neck pain 65/46/5035   Complicated migraine  04/28/2015   Other fatigue 04/28/2015   Abnormal finding on MRI of brain 04/28/2015   D (diarrhea) 03/29/2015   H/O disease 03/29/2015   Abnormal  weight loss 03/29/2015   Muscle weakness (generalized) 03/14/2015   Headache, migraine 03/10/2015   Arsenio Katz, SPT  This entire session was performed under direct supervision and direction of a licensed therapist/therapist assistant . I have personally read, edited and approve of the note as written.  Janna Arch, PT, DPT  02/28/2021, [place] MAIN [facility] SERVICES [address] [place], [place], [address] Phone: [phone]   Fax:  [phone]  Name: Janaya Broy MRN: [id number] Date of Birth: 03-Jul-1980

## 2021-03-03 ENCOUNTER — Ambulatory Visit: Payer: BC Managed Care – PPO

## 2021-03-07 ENCOUNTER — Other Ambulatory Visit: Payer: Self-pay

## 2021-03-07 ENCOUNTER — Ambulatory Visit: Payer: BC Managed Care – PPO

## 2021-03-07 DIAGNOSIS — R2689 Other abnormalities of gait and mobility: Secondary | ICD-10-CM

## 2021-03-07 DIAGNOSIS — M62838 Other muscle spasm: Secondary | ICD-10-CM

## 2021-03-07 DIAGNOSIS — M6281 Muscle weakness (generalized): Secondary | ICD-10-CM | POA: Diagnosis not present

## 2021-03-07 NOTE — Therapy (Signed)
[place] MAIN [facility] SERVICES [address] [place], [place], [address] Phone: [phone]   Fax:  [phone]  Physical Therapy Treatment/RECERT  Patient Details  Name: Christine Chavez MRN: [id number] Date of Birth: 17-Feb-1981 Referring Provider (PT): Elza Rafter   Encounter Date: 03/07/2021   PT End of Session - 03/07/21 1013     Visit Number 137    Number of Visits 161    Date for PT Re-Evaluation 05/30/21    Authorization Type 7/10 PN 01/31/21    Progress Note Due on Visit 130    PT Start Time 1015    PT Stop Time 1059    PT Time Calculation (min) 44 min    Equipment Utilized During Treatment Gait belt    Activity Tolerance Patient tolerated treatment well;Patient limited by pain    Behavior During Therapy WFL for tasks assessed/performed             Past Medical History:  Diagnosis Date   Complication of anesthesia    ? seizures after anesthesia    Headache    Migraines    Neurogenic bladder    Renal disorder    Vision abnormalities     Past Surgical History:  Procedure Laterality Date   ANTERIOR CRUCIATE LIGAMENT REPAIR  1997   APPENDECTOMY     COLONOSCOPY WITH PROPOFOL N/A 11/11/2018   Procedure: COLONOSCOPY WITH PROPOFOL;  Surgeon: [name], MD;  Location: [facility] ENDOSCOPY;  Service: Endoscopy;  Laterality: N/A;   CYSTOSCOPY WITH STENT PLACEMENT Right 04/17/2016   Procedure: CYSTOSCOPY WITH STENT PLACEMENT;  Surgeon: [name], MD;  Location: [facility] ORS;  Service: Urology;  Laterality: Right;   ESOPHAGOGASTRODUODENOSCOPY (EGD) WITH PROPOFOL N/A 11/11/2018   Procedure: ESOPHAGOGASTRODUODENOSCOPY (EGD) WITH PROPOFOL;  Surgeon: [name], MD;  Location: [facility] ENDOSCOPY;  Service: Endoscopy;  Laterality: N/A;   EXPLORATORY LAPAROTOMY  1999   KIDNEY STONE SURGERY  04/2016   REVISION UROSTOMY CUTANEOUS     REVISION UROSTOMY CUTANEOUS  01/10/2018   SUPRAPUBIC CATHETER PLACEMENT  08/2017    TONSILLECTOMY      There were no vitals filed for this visit.   Subjective Assessment - 03/07/21 1321     Subjective Patient was able to attend two consecutive sporting events last week in her power chair. Is having significant back pain and migraines throughout the weekend.    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 Due to the rarity of her disorder it took years prior to diagnosis. 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  Patient has good days and bad days. Her good days allow for short ambulation (within room only not between rooms) and her bad days she is unable to ambulate.    Limitations Lifting;Standing;Walking;House hold activities;Other (comment)    How long can you stand comfortably? limited on bad days    How long can you walk comfortably? within room only on good days, unable on bad days    Patient Stated Goals to regain PLOF    Currently in Pain? Yes    Pain Score 6     Pain Location Back    Pain Orientation Mid;Lower    Pain Descriptors / Indicators Aching    Pain Type Chronic  pain;Acute pain    Pain Onset 1 to 4 weeks ago    Pain Frequency Constant                       Goals:  10 MWT: 1 min 11 seconds  5x STS: 20 seconds with SUE on wheelchair 1 hour of light housework TUG: 59 seconds Two consecutive events for children: able to perform with power chair     Treatment:   Prone: STM to lumbar paraspinals and quadratus lumborum x 7 minutes with implementation of effleurage and pettrisage Grade II CPA mobilizations 2x 10 seconds each lumbar level   Supine LE rotation 60 seconds Supine hamstring lengthening stretch on PT shoulder 60  seconds Supine hip abduction with GTB 15x    Pt educated throughout session about proper posture and technique with exercises. Improved exercise technique, movement at target joints, use of target muscles after min to mod verbal, visual, tactile cues.    Patient has shown significant mobility improvements since last recert in august improving TUG by over 47 seconds as well as significant improvements in 10 MWT.  Patient is highly motivated and has demonstrated significant improvements with physical therapy. She continues to have multiple areas to continue to focus on at this time with areas including strength, stability, and prolonged mobility. Pt will benefit from skilled PT to improve strength, stability, and decrease pain to improve safety and independence with functional mobility and quality of life.    PT Education - 03/07/21 1014     Education Details goals, POC    Person(s) Educated Patient    Methods Explanation    Comprehension Verbalized understanding              PT Short Term Goals - 12/09/20 1201       PT SHORT TERM GOAL #1   Title Patient will demonstrate coordinated diaphragmatic breathing with pelvic tilts to demonstrate improved control of diaphragm and TA, to allow for further strengthening of core musculature and decreased pelvic floor spasm.    Baseline Pt. demonstrates breathing dysfunction and poor PFM coordination evidenced by anal manometry As of 2/22: Pt. continues to have restriction in her diaphragm and near T/L junction but is able to intentionally use diaphragmatic breathing through the available ROM.    Time 5    Period Weeks    Status Achieved    Target Date 04/20/19      PT SHORT TERM GOAL #2   Title Patient will report a reduction in pain to no greater than 6/10 over the prior week to demonstrate symptom improvement.    Baseline Pain is 10/10 at worst, 2/10 at best As of 2/22: Pt. had achieved this goal for 1 week as of 1/21 but may have infection  or other insult that caused pain to increase again. As of 4/29: Pain high of 8/10 but has been over-doing her activity over the past week. As of 6/10: Pt. was able to keep pain at 6 or below since prior visit by using TENS to help keep tension lower but has not had a full week to test if it will keep working. As of 7/6: Pt. had met this goal but has a new UTI and it caused pain to spike to 8/10 over the weekend. 8/25: 6/10 due to back pain    Time 5    Period Weeks    Status Achieved    Target Date 01/13/21      PT  SHORT TERM GOAL #3   Title Patient will demonstrate HEP x1 in the clinic to demonstrate understanding and proper form to allow for further improvement.    Baseline Pt. lacks knowledge of therepeutic exercises that can decrease her pain/Sx.    Time 5    Period Weeks    Status Achieved    Target Date 04/20/19      PT SHORT TERM GOAL #4   Title Patient will report consistent use of foot-stool (squatty-potty) for positioning with BM to decrease pain with BM and intra-abdominal pressure.    Baseline Pt. having constipation due to PFM dysfunction    Time 5    Period Weeks    Status Achieved    Target Date 04/20/19               PT Long Term Goals - 03/07/21 0001       PT LONG TERM GOAL #1   Title Patient will be independent with manual wheelchair mobility in public location such as store for increased independence.    Baseline 11/21: unable to push herself around entire store yet    Time 12    Period Weeks    Status New    Target Date 05/30/21      PT LONG TERM GOAL #2   Title Patient will increase 10 meter walk test to <30 seconds as to improve gait speed for better community ambulation and to reduce fall risk.    Baseline 6/2: 1 min 18 seconds with rollator 8/25: deferred due to excessive knee buckling during TUG 10/17: 1 min 38 seconds with frequent knee buckling 11/21: 1 min 11 seconds with knee buckling with rollator    Time 12    Period Weeks    Status  Partially Met    Target Date 05/30/21      PT LONG TERM GOAL #3   Title Patient (< 31 years old) will complete five times sit to stand test in < 20 seconds indicating an increased LE strength and improved balance.    Baseline 6/2: 43 seconds with heavy BUE support 8/25: 40 seconds with SUE support 10/17: 21 seconds with SUE support 11/21: 20 seconds with SUE support    Time 12    Period Weeks    Status Partially Met    Target Date 05/30/21      PT LONG TERM GOAL #4   Title Pt. will be able to perform 1 hr. of light housework reqiring intermittent standing and walking without increased pain/fatigue >2 pts.    Baseline Pt. becomes fatigued washing fruits/vegetbles in small stages and has fatigue>3 pts. 3/14: able to perform but needs rest breaks 4/25: able to do but is fatiguing 6/2: able to perform on good days; unable to perform on bad days 8/25: 4-5/10 10/17: 40% of the time    Time 12    Period Weeks    Status Partially Met    Target Date 05/30/21      PT LONG TERM GOAL #5   Title Patient will reduce timed up and go to <30 seconds to reduce fall risk and demonstrate improved transfer/gait ability.    Baseline 8/25: 1 min 47 seconds with rollator 10/17: 1 min 2 seconds 11/21: 59 seconds    Time 12    Period Weeks    Status Partially Met    Target Date 05/30/21      PT LONG TERM GOAL #7   Title Patient will attend two consecutive athletic  events for her children indicating improved capacity for activities and quality of life.    Baseline 8/25: can only attend one at a time 10/17: 1.5 seconds 11/21: able to attend in power chair not yet in manual chair    Time 12    Period Weeks    Status Partially Met    Target Date 05/30/21                   Plan - 03/07/21 1320     Clinical Impression Statement Patient has shown significant mobility improvements since last recert in august improving TUG by over 47 seconds as well as significant improvements in 10 MWT.  Patient is  highly motivated and has demonstrated significant improvements with physical therapy. She continues to have multiple areas to continue to focus on at this time with areas including strength, stability, and prolonged mobility. Pt will benefit from skilled PT to improve strength, stability, and decrease pain to improve safety and independence with functional mobility and quality of life.    Personal Factors and Comorbidities Comorbidity 3+;Transportation;Time since onset of injury/illness/exacerbation;Finances    Comorbidities neurologic insult with pending diagnosis, multiple recurrent UTI's, BLE weakness, depression, hereditary spastic paraplegia    Examination-Activity Limitations Caring for Others;Carry;Continence;Squat;Stairs;Toileting;Locomotion Level;Lift;Stand;Bed Mobility;Bend;Reach Overhead;Sit;Dressing;Transfers    Examination-Participation Restrictions Church;Cleaning;Community Activity;Driving;Interpersonal Relationship;Laundry;[employer] Work;Meal Prep;School;Volunteer    Stability/Clinical Decision Making Unstable/Unpredictable    Rehab Potential Fair    PT Frequency 2x / week    PT Duration 12 weeks    PT Treatment/Interventions ADLs/Self Care Home Management;Aquatic Therapy;Biofeedback;Moist Heat;Electrical Stimulation;Cryotherapy;Traction;Ultrasound;Therapeutic activities;Functional mobility training;Stair training;Gait training;Therapeutic exercise;Balance training;Neuromuscular re-education;Patient/family education;Manual techniques;Dry needling;Scar mobilization;Energy conservation;Taping;Joint Manipulations;Spinal Manipulations;Visual/perceptual remediation/compensation;Passive range of motion;Wheelchair mobility training;DME Instruction;Orthotic Fit/Training    PT Next Visit Plan transfers, stabilization in standing, core activation    PT Home Exercise Plan Added Quad sets;Access Code: [email or web address] URL: [email or web address]    Consulted and Agree with Plan of Care Patient              Patient will benefit from skilled therapeutic intervention in order to improve the following deficits and impairments:  Abnormal gait, Decreased balance, Decreased endurance, Decreased mobility, Difficulty walking, Increased muscle spasms, Impaired sensation, Improper body mechanics, Impaired tone, Decreased activity tolerance, Decreased coordination, Decreased strength, Postural dysfunction, Pain, Impaired flexibility  Visit Diagnosis: Muscle weakness (generalized)  Other muscle spasm  Other abnormalities of gait and mobility     Problem List Patient Active Problem List   Diagnosis Date Noted   Seizure ([place]) 11/11/2018   Major depressive disorder, recurrent episode, moderate ([place]) 02/07/2018   Nephrolithiasis 04/16/2016   Numbness 07/28/2015   Bladder retention 06/23/2015   Abdominal pain 06/04/2015   Dizziness 05/18/2015   Neck pain 33/83/2919   Complicated migraine 16/60/6004   Other fatigue 04/28/2015   Abnormal finding on MRI of brain 04/28/2015   D (diarrhea) 03/29/2015   H/O disease 03/29/2015   Abnormal weight loss 03/29/2015   Muscle weakness (generalized) 03/14/2015   Headache, migraine 03/10/2015   Janna Arch, PT, DPT  03/07/2021, 1:22 PM  [place] MAIN [facility] SERVICES [address] [place], [place], [address] Phone: [phone]   Fax:  [phone]  Name: Christine Chavez MRN: [id number] Date of Birth: Aug 17, 1980

## 2021-03-14 ENCOUNTER — Ambulatory Visit: Payer: BC Managed Care – PPO

## 2021-03-16 ENCOUNTER — Ambulatory Visit: Payer: BC Managed Care – PPO | Admitting: Obstetrics and Gynecology

## 2021-03-17 ENCOUNTER — Ambulatory Visit: Payer: BC Managed Care – PPO | Attending: Gastroenterology

## 2021-03-17 ENCOUNTER — Other Ambulatory Visit: Payer: Self-pay

## 2021-03-17 DIAGNOSIS — R262 Difficulty in walking, not elsewhere classified: Secondary | ICD-10-CM | POA: Diagnosis present

## 2021-03-17 DIAGNOSIS — M62838 Other muscle spasm: Secondary | ICD-10-CM | POA: Insufficient documentation

## 2021-03-17 DIAGNOSIS — R269 Unspecified abnormalities of gait and mobility: Secondary | ICD-10-CM | POA: Insufficient documentation

## 2021-03-17 DIAGNOSIS — M6281 Muscle weakness (generalized): Secondary | ICD-10-CM | POA: Insufficient documentation

## 2021-03-17 NOTE — Therapy (Signed)
[place] MAIN [facility] SERVICES [address] [place], [place], [address] Phone: [phone]   Fax:  [phone]  Physical Therapy Treatment  Patient Details  Name: Hillari Zumwalt MRN: [id number] Date of Birth: April 28, 1980 Referring Provider (PT): Elza Rafter   Encounter Date: 03/17/2021   PT End of Session - 03/17/21 1224     Visit Number 138    Number of Visits 161    Date for PT Re-Evaluation 05/30/21    Authorization Type 7/10 PN 01/31/21    Progress Note Due on Visit 130    PT Start Time 1108    PT Stop Time 1153    PT Time Calculation (min) 45 min    Equipment Utilized During Treatment Gait belt    Activity Tolerance Patient tolerated treatment well;Patient limited by pain    Behavior During Therapy WFL for tasks assessed/performed             Past Medical History:  Diagnosis Date   Complication of anesthesia    ? seizures after anesthesia    Headache    Migraines    Neurogenic bladder    Renal disorder    Vision abnormalities     Past Surgical History:  Procedure Laterality Date   ANTERIOR CRUCIATE LIGAMENT REPAIR  1997   APPENDECTOMY     COLONOSCOPY WITH PROPOFOL N/A 11/11/2018   Procedure: COLONOSCOPY WITH PROPOFOL;  Surgeon: [name], MD;  Location[facility] ENDOSCOPY;  Service: Endoscopy;  Laterality: N/A;   CYSTOSCOPY WITH STENT PLACEMENT Right 04/17/2016   Procedure: CYSTOSCOPY WITH STENT PLACEMENT;  Surgeon: [name], MD;  Location: [facility] ORS;  Service: Urology;  Laterality: Right;   ESOPHAGOGASTRODUODENOSCOPY (EGD) WITH PROPOFOL N/A 11/11/2018   Procedure: ESOPHAGOGASTRODUODENOSCOPY (EGD) WITH PROPOFOL;  Surgeon: [name], MD;  Location: [facility] ENDOSCOPY;  Service: Endoscopy;  Laterality: N/A;   EXPLORATORY LAPAROTOMY  1999   KIDNEY STONE SURGERY  04/2016   REVISION UROSTOMY CUTANEOUS     REVISION UROSTOMY CUTANEOUS  01/10/2018   SUPRAPUBIC CATHETER PLACEMENT  08/2017    TONSILLECTOMY      There were no vitals filed for this visit.   Subjective Assessment - 03/17/21 1221     Subjective Patient reports increased stress and feels that "my body is a huge mess today". Reports increased bladder spasms and pain recently and feels like she may be experiencing an UTI because she has noticed a stronger smell from her urostomy bag. Denies falls or LOB since last session.    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 Due to the rarity of her disorder it took years prior to diagnosis. 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  Patient has good days and bad days. Her good days allow for short ambulation (within room only not between rooms) and her bad days she is unable to ambulate.    Limitations Lifting;Standing;Walking;House hold activities;Other (comment)    How long can you stand comfortably? limited on bad days    How long can you walk comfortably? within room only on good days, unable on bad days    Patient Stated Goals to regain PLOF    Currently in Pain? Yes    Pain Score 6  Pain Location Back    Pain Orientation Mid;Lower    Pain Descriptors / Indicators Aching    Pain Type Chronic pain;Acute pain    Pain Onset 1 to 4 weeks ago            TherAc:  Patient with minA for navigating lip into bathroom. CGA<>min A for stand pivot transfer from transport chair to commode. Independent with self-catheter voiding in sitting. CGA<>minA for stand pivot transfer back to transport chair.   TherEx: Prone Alternating Hip extensions 1x12 each leg Prone Donkey kicks 1x10 each leg  Lower trunk rotations x20  Prone mini-cobra/thoracic extension cuing for exhale on  extension, 1x15  Passive hamstring stretch 3x30sec  Supine Bridges 1x20 with 5 sec holds  Supine snow angels 1x20, with Moist heat pack to mid back STM to sacrotuberous ligament and glute max/piriformis attachments with lower trunk rotations. Increased tension and tenderness to palpation noted on left side. [id number]  Pt educated throughout session about proper posture and technique with exercises. Improved exercise technique, movement at target joints, use of target muscles after min to mod verbal, visual, tactile cues.   Patient had transportation challenges and arrived late to appointment but gave good effort despite limited time. Patient's pain and stress levels were increased today so skilled PT focused on pain reduction, relaxation, and gentle TherEx. Patient will continue to benefit from skilled PT to address gait deficits, standing and mobility tolerance, and strength to improve quality of life and level of independence with functional mobility and ADLs.                           PT Education - 03/17/21 1223     Education Details exercise technique    Person(s) Educated Patient    Methods Explanation;Demonstration    Comprehension Verbalized understanding;Returned demonstration              PT Short Term Goals - 12/09/20 1201       PT SHORT TERM GOAL #1   Title Patient will demonstrate coordinated diaphragmatic breathing with pelvic tilts to demonstrate improved control of diaphragm and TA, to allow for further strengthening of core musculature and decreased pelvic floor spasm.    Baseline Pt. demonstrates breathing dysfunction and poor PFM coordination evidenced by anal manometry As of 2/22: Pt. continues to have restriction in her diaphragm and near T/L junction but is able to intentionally use diaphragmatic breathing through the available ROM.    Time 5    Period Weeks    Status Achieved    Target Date 04/20/19      PT SHORT TERM GOAL #2   Title  Patient will report a reduction in pain to no greater than 6/10 over the prior week to demonstrate symptom improvement.    Baseline Pain is 10/10 at worst, 2/10 at best As of 2/22: Pt. had achieved this goal for 1 week as of 1/21 but may have infection or other insult that caused pain to increase again. As of 4/29: Pain high of 8/10 but has been over-doing her activity over the past week. As of 6/10: Pt. was able to keep pain at 6 or below since prior visit by using TENS to help keep tension lower but has not had a full week to test if it will keep working. As of 7/6: Pt. had met this goal but has a new UTI and it caused pain to spike to 8/10 over the weekend. 8/25:  6/10 due to back pain    Time 5    Period Weeks    Status Achieved    Target Date 01/13/21      PT SHORT TERM GOAL #3   Title Patient will demonstrate HEP x1 in the clinic to demonstrate understanding and proper form to allow for further improvement.    Baseline Pt. lacks knowledge of therepeutic exercises that can decrease her pain/Sx.    Time 5    Period Weeks    Status Achieved    Target Date 04/20/19      PT SHORT TERM GOAL #4   Title Patient will report consistent use of foot-stool (squatty-potty) for positioning with BM to decrease pain with BM and intra-abdominal pressure.    Baseline Pt. having constipation due to PFM dysfunction    Time 5    Period Weeks    Status Achieved    Target Date 04/20/19               PT Long Term Goals - 03/07/21 0001       PT LONG TERM GOAL #1   Title Patient will be independent with manual wheelchair mobility in public location such as store for increased independence.    Baseline 11/21: unable to push herself around entire store yet    Time 12    Period Weeks    Status New    Target Date 05/30/21      PT LONG TERM GOAL #2   Title Patient will increase 10 meter walk test to <30 seconds as to improve gait speed for better community ambulation and to reduce fall risk.     Baseline 6/2: 1 min 18 seconds with rollator 8/25: deferred due to excessive knee buckling during TUG 10/17: 1 min 38 seconds with frequent knee buckling 11/21: 1 min 11 seconds with knee buckling with rollator    Time 12    Period Weeks    Status Partially Met    Target Date 05/30/21      PT LONG TERM GOAL #3   Title Patient (< 9 years old) will complete five times sit to stand test in < 20 seconds indicating an increased LE strength and improved balance.    Baseline 6/2: 43 seconds with heavy BUE support 8/25: 40 seconds with SUE support 10/17: 21 seconds with SUE support 11/21: 20 seconds with SUE support    Time 12    Period Weeks    Status Partially Met    Target Date 05/30/21      PT LONG TERM GOAL #4   Title Pt. will be able to perform 1 hr. of light housework reqiring intermittent standing and walking without increased pain/fatigue >2 pts.    Baseline Pt. becomes fatigued washing fruits/vegetbles in small stages and has fatigue>3 pts. 3/14: able to perform but needs rest breaks 4/25: able to do but is fatiguing 6/2: able to perform on good days; unable to perform on bad days 8/25: 4-5/10 10/17: 40% of the time    Time 12    Period Weeks    Status Partially Met    Target Date 05/30/21      PT LONG TERM GOAL #5   Title Patient will reduce timed up and go to <30 seconds to reduce fall risk and demonstrate improved transfer/gait ability.    Baseline 8/25: 1 min 47 seconds with rollator 10/17: 1 min 2 seconds 11/21: 59 seconds    Time 12    Period  Weeks    Status Partially Met    Target Date 05/30/21      PT LONG TERM GOAL #7   Title Patient will attend two consecutive athletic events for her children indicating improved capacity for activities and quality of life.    Baseline 8/25: can only attend one at a time 10/17: 1.5 seconds 11/21: able to attend in power chair not yet in manual chair    Time 12    Period Weeks    Status Partially Met    Target Date 05/30/21                    Plan - 03/17/21 1225     Clinical Impression Statement Patient had transportation challenges and arrived late to appointment but gave good effort despite limited time. Patient's pain and stress levels were increased today so skilled PT focused on pain reduction, relaxation, and gentle TherEx. Patient will continue to benefit from skilled PT to address gait deficits, standing and mobility tolerance, and strength to improve quality of life and level of independence with functional mobility and ADLs.    Personal Factors and Comorbidities Comorbidity 3+;Transportation;Time since onset of injury/illness/exacerbation;Finances    Comorbidities neurologic insult with pending diagnosis, multiple recurrent UTI's, BLE weakness, depression, hereditary spastic paraplegia    Examination-Activity Limitations Caring for Others;Carry;Continence;Squat;Stairs;Toileting;Locomotion Level;Lift;Stand;Bed Mobility;Bend;Reach Overhead;Sit;Dressing;Transfers    Examination-Participation Restrictions Church;Cleaning;Community Activity;Driving;Interpersonal Relationship;Laundry;[employer] Work;Meal Prep;School;Volunteer    Stability/Clinical Decision Making Unstable/Unpredictable    Rehab Potential Fair    PT Frequency 2x / week    PT Duration 12 weeks    PT Treatment/Interventions ADLs/Self Care Home Management;Aquatic Therapy;Biofeedback;Moist Heat;Electrical Stimulation;Cryotherapy;Traction;Ultrasound;Therapeutic activities;Functional mobility training;Stair training;Gait training;Therapeutic exercise;Balance training;Neuromuscular re-education;Patient/family education;Manual techniques;Dry needling;Scar mobilization;Energy conservation;Taping;Joint Manipulations;Spinal Manipulations;Visual/perceptual remediation/compensation;Passive range of motion;Wheelchair mobility training;DME Instruction;Orthotic Fit/Training    PT Next Visit Plan transfers, stabilization in standing, core activation    PT Home Exercise  Plan Added Quad sets;Access Code: [email or web address] URL: [email or web address]    Consulted and Agree with Plan of Care Patient             Patient will benefit from skilled therapeutic intervention in order to improve the following deficits and impairments:  Abnormal gait, Decreased balance, Decreased endurance, Decreased mobility, Difficulty walking, Increased muscle spasms, Impaired sensation, Improper body mechanics, Impaired tone, Decreased activity tolerance, Decreased coordination, Decreased strength, Postural dysfunction, Pain, Impaired flexibility  Visit Diagnosis: Other muscle spasm  Abnormality of gait and mobility  Muscle weakness (generalized)     Problem List Patient Active Problem List   Diagnosis Date Noted   Seizure ([place]) 11/11/2018   Major depressive disorder, recurrent episode, moderate ([place]) 02/07/2018   Nephrolithiasis 04/16/2016   Numbness 07/28/2015   Bladder retention 06/23/2015   Abdominal pain 06/04/2015   Dizziness 05/18/2015   Neck pain 75/01/2584   Complicated migraine 27/78/2423   Other fatigue 04/28/2015   Abnormal finding on MRI of brain 04/28/2015   D (diarrhea) 03/29/2015   H/O disease 03/29/2015   Abnormal weight loss 03/29/2015   Muscle weakness (generalized) 03/14/2015   Headache, migraine 03/10/2015   Arsenio Katz, SPT This entire session was performed under direct supervision and direction of a licensed therapist/therapist assistant . I have personally read, edited and approve of the note as written.  Janna Arch, PT, DPT  03/17/2021, 12:26 PM  [place] MAIN [facility] SERVICES [address] [place], [place], [address] Phone: [phone]   Fax:  [phone]  Name: Loan Oguin MRN: [id number] Date of  Birth: 13-Mar-1981

## 2021-03-18 ENCOUNTER — Ambulatory Visit (INDEPENDENT_AMBULATORY_CARE_PROVIDER_SITE_OTHER): Payer: BC Managed Care – PPO | Admitting: Obstetrics and Gynecology

## 2021-03-18 ENCOUNTER — Encounter: Payer: Self-pay | Admitting: Obstetrics and Gynecology

## 2021-03-18 VITALS — BP 118/72 | Ht 67.0 in | Wt 140.0 lb

## 2021-03-18 DIAGNOSIS — N921 Excessive and frequent menstruation with irregular cycle: Secondary | ICD-10-CM | POA: Diagnosis not present

## 2021-03-18 MED ORDER — NORETHINDRONE 0.35 MG PO TABS: 1.0000 | ORAL_TABLET | Freq: Every day | ORAL | 11 refills | Status: DC

## 2021-03-18 NOTE — Patient Instructions (Signed)
Magnesium 500 mg daily B2 400 mg daily Coenzyme q10 150 mg daily Ferrous Sulfate 65 mg elemental iron daily   Iron-Rich Diet Iron is a mineral that helps your body produce hemoglobin. Hemoglobin is a protein in red blood cells that carries oxygen to your body's tissues. Eating too little iron may cause you to feel weak and tired, and it can increase your risk of infection. Iron is naturally found in many foods, and many foods have iron added to them (are iron-fortified). You may need to follow an iron-rich diet if you do not have enough iron in your body due to certain medical conditions. The amount of iron that you need each day depends on your age, your sex, and any medical conditions you have. Follow instructions from your health care provider or a dietitian about how much iron you should eat each day. What are tips for following this plan? Reading food labels Check food labels to see how many milligrams (mg) of iron are in each serving. Cooking Cook foods in pots and pans that are made from iron. Take these steps to make it easier for your body to absorb iron from certain foods: Soak beans overnight before cooking. Soak whole grains overnight and drain them before using. Ferment flours before baking, such as by using yeast in bread dough. Meal planning When you eat foods that contain iron, you should eat them with foods that are high in vitamin C. These include oranges, peppers, tomatoes, potatoes, and mangoes. Vitamin C helps your body absorb iron. Certain foods and drinks prevent your body from absorbing iron properly. Avoid eating these foods in the same meal as iron-rich foods or with iron supplements. These foods include: Coffee, black tea, and red wine. Milk, dairy products, and foods that are high in calcium. Beans and soybeans. Whole grains. General information Take iron supplements only as told by your health care provider. An overdose of iron can be life-threatening. If you  were prescribed iron supplements, take them with orange juice or a vitamin C supplement. When you eat iron-fortified foods or take an iron supplement, you should also eat foods that naturally contain iron, such as meat, poultry, and fish. Eating naturally iron-rich foods helps your body absorb the iron that is added to other foods or contained in a supplement. Iron from animal sources is better absorbed than iron from plant sources. What foods should I eat? Fruits Prunes. Raisins. Eat fruits high in vitamin C, such as oranges, grapefruits, and strawberries, with iron-rich foods. Vegetables Spinach (cooked). Green peas. Broccoli. Fermented vegetables. Eat vegetables high in vitamin C, such as leafy greens, potatoes, bell peppers, and tomatoes, with iron-rich foods. Grains Iron-fortified breakfast cereal. Iron-fortified whole-wheat bread. Enriched rice. Sprouted grains. Meats and other proteins Beef liver. Beef. [place]. Chicken. Oysters. Shrimp. Tuna. Sardines. Chickpeas. Nuts. Tofu. Pumpkin seeds. Beverages Tomato juice. Fresh orange juice. Prune juice. Hibiscus tea. Iron-fortified instant breakfast shakes. Sweets and desserts Blackstrap molasses. Seasonings and condiments Tahini. Fermented soy sauce. Other foods Wheat germ. The items listed above may not be a complete list of recommended foods and beverages. Contact a dietitian for more information. What foods should I limit? These are foods that should be limited while eating iron-rich foods as they can reduce the absorption of iron in your body. Grains Whole grains. Bran cereal. Bran flour. Meats and other proteins Soybeans. Products made from soy protein. Black beans. Lentils. Mung beans. Split peas. Dairy Milk. Cream. Cheese. Yogurt. Cottage cheese. Beverages Coffee. Black tea.  Red wine. Sweets and desserts Cocoa. Chocolate. Ice cream. Seasonings and condiments Basil. Oregano. Large amounts of parsley. The items listed above  may not be a complete list of foods and beverages you should limit. Contact a dietitian for more information. Summary Iron is a mineral that helps your body produce hemoglobin. Hemoglobin is a protein in red blood cells that carries oxygen to your body's tissues. Iron is naturally found in many foods, and many foods have iron added to them (are iron-fortified). When you eat foods that contain iron, you should eat them with foods that are high in vitamin C. Vitamin C helps your body absorb iron. Certain foods and drinks prevent your body from absorbing iron properly, such as whole grains and dairy products. You should avoid eating these foods in the same meal as iron-rich foods or with iron supplements. This information is not intended to replace advice given to you by your health care provider. Make sure you discuss any questions you have with your health care provider. Document Revised: 03/15/2020 Document Reviewed: 03/15/2020 Elsevier Patient Education  2022 [employer]  Migraine Headache A migraine headache is an intense, throbbing pain on one side or both sides of the head. Migraine headaches may also cause other symptoms, such as nausea, vomiting, and sensitivity to light and noise. A migraine headache can last from 4 hours to 3 days. Talk with your doctor about what things may bring on (trigger) your migraine headaches. What are the causes? The exact cause of this condition is not known. However, a migraine may be caused when nerves in the brain become irritated and release chemicals that cause inflammation of blood vessels. This inflammation causes pain. This condition may be triggered or caused by: Drinking alcohol. Smoking. Taking medicines, such as: Medicine used to treat chest pain (nitroglycerin). Birth control pills. Estrogen. Certain blood pressure medicines. Eating or drinking products that contain nitrates, glutamate, aspartame, or tyramine. Aged cheeses, chocolate, or  caffeine may also be triggers. Doing physical activity. Other things that may trigger a migraine headache include: Menstruation. Pregnancy. Hunger. Stress. Lack of sleep or too much sleep. Weather changes. Fatigue. What increases the risk? The following factors may make you more likely to experience migraine headaches: Being a certain age. This condition is more common in people who are 78-62 years old. Being female. Having a family history of migraine headaches. Being Caucasian. Having a mental health condition, such as depression or anxiety. Being obese. What are the signs or symptoms? The main symptom of this condition is pulsating or throbbing pain. This pain may: Happen in any area of the head, such as on one side or both sides. Interfere with daily activities. Get worse with physical activity. Get worse with exposure to bright lights or loud noises. Other symptoms may include: Nausea. Vomiting. Dizziness. General sensitivity to bright lights, loud noises, or smells. Before you get a migraine headache, you may get warning signs (an aura). An aura may include: Seeing flashing lights or having blind spots. Seeing bright spots, halos, or zigzag lines. Having tunnel vision or blurred vision. Having numbness or a tingling feeling. Having trouble talking. Having muscle weakness. Some people have symptoms after a migraine headache (postdromal phase), such as: Feeling tired. Difficulty concentrating. How is this diagnosed? A migraine headache can be diagnosed based on: Your symptoms. A physical exam. Tests, such as: CT scan or an MRI of the head. These imaging tests can help rule out other causes of headaches. Taking fluid from the spine (  lumbar puncture) and analyzing it (cerebrospinal fluid analysis, or CSF analysis). How is this treated? This condition may be treated with medicines that: Relieve pain. Relieve nausea. Prevent migraine headaches. Treatment for this  condition may also include: Acupuncture. Lifestyle changes like avoiding foods that trigger migraine headaches. Biofeedback. Cognitive behavioral therapy. Follow these instructions at home: Medicines Take over-the-counter and prescription medicines only as told by your health care provider. Ask your health care provider if the medicine prescribed to you: Requires you to avoid driving or using heavy machinery. Can cause constipation. You may need to take these actions to prevent or treat constipation: Drink enough fluid to keep your urine pale yellow. Take over-the-counter or prescription medicines. Eat foods that are high in fiber, such as beans, whole grains, and fresh fruits and vegetables. Limit foods that are high in fat and processed sugars, such as fried or sweet foods. Lifestyle Do not drink alcohol. Do not use any products that contain nicotine or tobacco, such as cigarettes, e-cigarettes, and chewing tobacco. If you need help quitting, ask your health care provider. Get at least 8 hours of sleep every night. Find ways to manage stress, such as meditation, deep breathing, or yoga. General instructions   Keep a journal to find out what may trigger your migraine headaches. For example, write down: What you eat and drink. How much sleep you get. Any change to your diet or medicines. If you have a migraine headache: Avoid things that make your symptoms worse, such as bright lights. It may help to lie down in a dark, quiet room. Do not drive or use heavy machinery. Ask your health care provider what activities are safe for you while you are experiencing symptoms. Keep all follow-up visits as told by your health care provider. This is important. Contact a health care provider if: You develop symptoms that are different or more severe than your usual migraine headache symptoms. You have more than 15 headache days in one month. Get help right away if: Your migraine headache  becomes severe. Your migraine headache lasts longer than 72 hours. You have a fever. You have a stiff neck. You have vision loss. Your muscles feel weak or like you cannot control them. You start to lose your balance often. You have trouble walking. You faint. You have a seizure. Summary A migraine headache is an intense, throbbing pain on one side or both sides of the head. Migraines may also cause other symptoms, such as nausea, vomiting, and sensitivity to light and noise. This condition may be treated with medicines and lifestyle changes. You may also need to avoid certain things that trigger a migraine headache. Keep a journal to find out what may trigger your migraine headaches. Contact your health care provider if you have more than 15 headache days in a month or you develop symptoms that are different or more severe than your usual migraine headache symptoms. This information is not intended to replace advice given to you by your health care provider. Make sure you discuss any questions you have with your health care provider. Document Revised: 07/26/2018 Document Reviewed: 05/16/2018 Elsevier Patient Education  2022 [employer] Menorrhagia Menorrhagia is a form of abnormal uterine bleeding in which menstrual periods are heavy or last longer than normal. With menorrhagia, the periods may cause enough blood loss and cramping that a woman becomes unable to take part in her usual activities. What are the causes? Common causes of this condition include: Polyps or fibroids. These are noncancerous  growths in the uterus. An imbalance of the hormones estrogen and progesterone. Anovulation, which occurs when one of the ovaries does not release an egg during one or more months. A problem with the thyroid gland (hypothyroidism). Side effects of having an intrauterine device (IUD). Side effects of some medicines, such as NSAIDs or blood thinners. A bleeding disorder that stops the blood  from clotting normally. In some cases, the cause of this condition is not known. What increases the risk? You are more likely to develop this condition if you have cancer of the uterus. What are the signs or symptoms? Symptoms of this condition include: Routinely having to change your pad or tampon every 1-2 hours because it is soaked. Needing to use pads and tampons at the same time because of heavy bleeding. Needing to wake up to change your pads or tampons during the night. Passing blood clots larger than 1 inch (2.5 cm) in size. Having bleeding that lasts for more than 7 days. Having symptoms of low iron levels (anemia), such as tiredness (fatigue) or shortness of breath. How is this diagnosed? This condition may be diagnosed based on: A physical exam. Your symptoms and menstrual history. Tests, such as: Blood tests to check if you are pregnant or if you have hormonal changes, a bleeding or thyroid disorder, anemia, or other problems. Pap test to check for cancerous changes, infections, or inflammation. Endometrial biopsy. This test involves removing a tissue sample from the lining of the uterus (endometrium) to be examined under a microscope. Pelvic ultrasound. This test uses sound waves to create images of your uterus, ovaries, and vagina. The images can show if you have fibroids or other growths. Hysteroscopy. For this test, a thin, flexible tube with a light on the end (hysteroscope) is used to look inside your uterus. How is this treated? Treatment may not be needed for this condition. If it is needed, the best treatment for you will depend on: Whether you need to prevent pregnancy. Your desire to have children in the future. The cause and severity of your bleeding. Your personal preference. Medicine Medicines are the first step in treatment. You may be treated with: Hormonal birth control methods. These treatments reduce bleeding during your menstrual period. They  include: Birth control pills. Skin patch. Vaginal ring. Shots (injections) that you get every 3 months. Hormonal IUD. Implants that go under the skin. Medicines that thicken the blood and slow bleeding. Medicines that reduce swelling, such as ibuprofen. Medicines that contain an artificial (synthetic) hormone called progestin. Medicines that make the ovaries stop working for a short time. Iron supplements to treat anemia.  Surgery If medicines do not work, surgery may be done. Surgical options may include: Dilation and curettage (D&C). In this procedure, your health care provider opens the lowest part of the uterus (cervix) and then scrapes or suctions tissue from the endometrium. This reduces menstrual bleeding. Operative hysteroscopy. In this procedure, a hysteroscope is used to view your uterus and help remove polyps that may be causing heavy periods. Endometrial ablation. This is when various techniques are used to permanently destroy your entire endometrium. After endometrial ablation, most women have little or no menstrual flow. This procedure reduces your ability to become pregnant. Endometrial resection. In this procedure, an electrosurgical wire loop is used to remove the endometrium. This procedure reduces your ability to become pregnant. Hysterectomy. This is surgical removal of your uterus. This is a permanent procedure that stops menstrual periods. Pregnancy is not possible after a  hysterectomy. Follow these instructions at home: Medicines Take over-the-counter and prescription medicines only as told by your health care provider. This includes iron pills. Do not change or switch medicines without asking your health care provider. Do not take aspirin or medicines that contain aspirin 1 week before or during your menstrual period. Aspirin may make bleeding worse. Managing constipation Your iron pills may cause constipation. If you are taking prescription iron supplements, you may  need to take these actions to prevent or treat constipation: Drink enough fluid to keep your urine pale yellow. Take over-the-counter or prescription medicines. Eat foods that are high in fiber, such as beans, whole grains, and fresh fruits and vegetables. Limit foods that are high in fat and processed sugars, such as fried or sweet foods. General instructions If you need to change your sanitary pad or tampon more than once every 2 hours, limit your activity until the bleeding stops. Eat well-balanced meals, including foods that are high in iron. Foods that have a lot of iron include leafy green vegetables, meat, liver, eggs, and whole-grain breads and cereals. Do not try to lose weight until the abnormal bleeding has stopped and your blood iron level is back to normal. If you need to lose weight, work with your health care provider to lose weight safely. Keep all follow-up visits. This is important. Contact a health care provider if: You soak through a pad or tampon every 1 or 2 hours, and this happens every time you have a period. You need to use pads and tampons at the same time because you are bleeding so much. You have nausea, vomiting, diarrhea, or other problems related to medicines you are taking. Get help right away if: You soak through more than a pad or tampon in 1 hour. You pass clots bigger than 1 inch (2.5 cm) wide. You feel short of breath. You feel like your heart is beating too fast. You feel dizzy or you faint. You feel very weak or tired. Summary Menorrhagia is a form of abnormal uterine bleeding in which menstrual periods are heavy or last longer than normal. Treatment may not be needed for this condition. If it is needed, it may include medicines or procedures. Take over-the-counter and prescription medicines only as told by your health care provider. This includes iron pills. Get help right away if you have heavy bleeding that soaks through more than a pad or tampon in  1 hour, you pass large clots, or you feel dizzy, short of breath, or very weak or tired. This information is not intended to replace advice given to you by your health care provider. Make sure you discuss any questions you have with your health care provider. Document Revised: 12/16/2019 Document Reviewed: 12/16/2019 Elsevier Patient Education  2022 [employer] Abnormal Uterine Bleeding Abnormal uterine bleeding is unusual bleeding from the uterus. It includes bleeding after sex, or bleeding or spotting between menstrual periods. It may also include bleeding that is heavier than normal, menstrual periods that last longer than usual, or bleeding that occurs after menopause. Abnormal uterine bleeding can affect teenagers, women in their reproductive years, pregnant women, and women who have reached menopause. Common causes of abnormal uterine bleeding include: Pregnancy. Abnormal growths within the lining of the uterus (polyps). Benign tumors or growths in the uterus (fibroids). These are not cancer. Infection. Cancer. Too much or too little of some hormones in the body (hormonal imbalances). Any type of abnormal bleeding should be checked by a health care provider.  Many cases are minor and simple to treat, but others may be more serious. Treatment will depend on the cause of the bleeding and how severe it is. Follow these instructions at home: Medicines Take over-the-counter and prescription medicines only as told by your health care provider. Ask your health care provider about: Taking medicines such as aspirin and ibuprofen. These medicines can thin your blood. Do not take these medicines unless your health care provider tells you to take them. Taking over-the-counter medicines, vitamins, herbs, and supplements. If you were prescribed iron pills, take them as told by your health care provider. Iron pills help to replace iron that your body loses because of this condition. Managing  constipation In cases of severe bleeding, you may be asked to increase your iron intake to treat anemia. Doing this may cause constipation. To prevent or treat constipation, you may need to: Drink enough fluid to keep your urine pale yellow. Take over-the-counter or prescription medicines. Eat foods that are high in fiber, such as beans, whole grains, and fresh fruits and vegetables. Limit foods that are high in fat and processed sugars, such as fried or sweet foods. Activity Alter your activity to decrease bleeding if you need to change your sanitary pad more than one time every 2 hours: Lie in bed with your feet raised (elevated). Place a cold pack on your lower abdomen. Rest as much as possible until the bleeding stops or slows down. General instructions Do not use tampons, douche, or have sex until your health care provider says these things are okay. Change your sanitary pads often. Get regular exams. These include pelvic exams and cervical cancer screenings. It is up to you to get the results of any tests that are done. Ask your health care provider, or the department that is doing the tests, when your results will be ready. Monitor your condition for any changes. For 2 months, write down: When your menstrual period starts. When your menstrual period ends. When any abnormal vaginal bleeding occurs. What problems you notice. Keep all follow-up visits. This is important. Contact a health care provider if: You have bleeding that lasts for more than one week. You feel dizzy at times. You feel nauseous or you vomit. You feel light-headed or weak. You notice any other changes that show that your condition is getting worse. Get help right away if: You faint. You have bleeding that soaks through a sanitary pad every hour. You have pain in the abdomen. You have a fever or chills. You become sweaty or weak. You pass large blood clots from your vagina. These symptoms may represent a  serious problem that is an emergency. Do not wait to see if the symptoms will go away. Get medical help right away. Call your local emergency services (911 in the [place] Do not drive yourself to the hospital. Summary Abnormal uterine bleeding is unusual bleeding from the uterus. Any type of abnormal bleeding should be checked by a health care provider. Many cases are minor and simple to treat, but others may be more serious. Treatment will depend on the cause of the bleeding and how severe it is. Get help right away if you faint, you have bleeding that soaks through a sanitary pad every hour, or you pass large blood clots from your vagina. This information is not intended to replace advice given to you by your health care provider. Make sure you discuss any questions you have with your health care provider. Document Revised: 08/03/2020 Document Reviewed:  08/03/2020 Elsevier Patient Education  2022 [employer].

## 2021-03-18 NOTE — Progress Notes (Signed)
Patient ID: Christine Chavez, female   DOB: 02-Sep-1980, 40 y.o.   MRN: [id number]  Reason for Consult: Gynecologic Exam   Referred by Jose-Mathews, Saul Fordyce, *  Subjective:     HPI:  Christine Chavez is a 40 y.o. female   Gynecological History  Patient's last menstrual period was 02/18/2021.  Past Medical History:  Diagnosis Date   Complication of anesthesia    ? seizures after anesthesia    Headache    Migraines    Neurogenic bladder    Renal disorder    Vision abnormalities    Family History  Problem Relation Age of Onset   Hypertension Mother    Atrial fibrillation Father    Healthy Brother    Depression Brother    Arthritis/Rheumatoid Paternal Grandmother    Healthy Brother    Past Surgical History:  Procedure Laterality Date   ANTERIOR CRUCIATE LIGAMENT REPAIR  1997   APPENDECTOMY     COLONOSCOPY WITH PROPOFOL N/A 11/11/2018   Procedure: COLONOSCOPY WITH PROPOFOL;  Surgeon: Christena Deem, MD;  Location: [facility] ENDOSCOPY;  Service: Endoscopy;  Laterality: N/A;   CYSTOSCOPY WITH STENT PLACEMENT Right 04/17/2016   Procedure: CYSTOSCOPY WITH STENT PLACEMENT;  Surgeon: Malen Gauze, MD;  Location: [facility] ORS;  Service: Urology;  Laterality: Right;   ESOPHAGOGASTRODUODENOSCOPY (EGD) WITH PROPOFOL N/A 11/11/2018   Procedure: ESOPHAGOGASTRODUODENOSCOPY (EGD) WITH PROPOFOL;  Surgeon: Christena Deem, MD;  Location: [facility] ENDOSCOPY;  Service: Endoscopy;  Laterality: N/A;   EXPLORATORY LAPAROTOMY  1999   KIDNEY STONE SURGERY  04/2016   REVISION UROSTOMY CUTANEOUS     REVISION UROSTOMY CUTANEOUS  01/10/2018   SUPRAPUBIC CATHETER PLACEMENT  08/2017   TONSILLECTOMY      Short Social History:  Social History   Tobacco Use   Smoking status: Never   Smokeless tobacco: Never  Substance Use Topics   Alcohol use: No    Alcohol/week: 0.0 standard drinks    Allergies  Allergen Reactions   Aspirin Shortness Of Breath, Swelling and Anaphylaxis   Ibuprofen  Shortness Of Breath, Swelling and Anaphylaxis   Vibegron Anaphylaxis   Morphine And Related Hives   Methenamine Hippurate Rash    Current Outpatient Medications  Medication Sig Dispense Refill   belladonna-opium (B&O SUPPRETTES) 16.2-30 MG suppository Place 30 mg rectally every 8 (eight) hours as needed for pain.     belladonna-opium (B&O SUPPRETTES) 16.2-30 MG suppository Place rectally.     Cyanocobalamin (VITAMIN B-12) 2500 MCG SUBL Take 2,500 mcg by mouth daily.     cyanocobalamin 1000 MCG tablet Take by mouth.     diazepam (VALIUM) 2 MG tablet Take 2 mg by mouth every 6 (six) hours as needed for anxiety or muscle spasms (Vaginal application for PFM spasms).     DULoxetine (CYMBALTA) 60 MG capsule Take 60 mg by mouth daily.     EMGALITY 120 MG/ML SOAJ SMARTSIG:120 Milligram(s) SUB-Q Every 4 Weeks     EPINEPHrine 0.3 mg/0.3 mL IJ SOAJ injection Inject into the muscle.     folic acid (FOLVITE) 1 MG tablet Take 1 mg by mouth daily.     fosfomycin (MONUROL) 3 g PACK SMARTSIG:3 Gram(s) By Mouth Every Other Day     hydrocortisone (ANUSOL-HC) 25 MG suppository Place 25 mg rectally daily as needed.     linaclotide (LINZESS) 145 MCG CAPS capsule TAKE 1 CAPSULE (145 MCG TOTAL) BY MOUTH ONCE DAILY     mirabegron ER (MYRBETRIQ) 50 MG TB24 tablet Take 50 mg  by mouth daily.      mirtazapine (REMERON) 30 MG tablet Take by mouth.     ondansetron (ZOFRAN) 4 MG tablet Take 4 mg by mouth every 8 (eight) hours as needed for nausea or vomiting.     oxybutynin (DITROPAN) 5 MG tablet Take 5 mg by mouth every 8 (eight) hours as needed.      pantoprazole (PROTONIX) 40 MG tablet Take 40 mg by mouth 2 (two) times daily.      pregabalin (LYRICA) 75 MG capsule Takes 2 caps po q am and 2 caps po QHS     promethazine (PHENERGAN) 12.5 MG tablet Take 1 tablet (12.5 mg total) by mouth every 4 (four) hours as needed for nausea or vomiting. 20 tablet 0   rizatriptan (MAXALT) 5 MG tablet Take 5 mg by mouth as needed.       tiZANidine (ZANAFLEX) 2 MG tablet Take 2 mg by mouth at bedtime.     UBRELVY 50 MG TABS Take by mouth.     B Complex Vitamins (VITAMIN B COMPLEX 100) INJ Inject as directed. (Patient not taking: Reported on 12/23/2020)     baclofen (LIORESAL) 10 MG tablet Take 10 mg by mouth 3 (three) times daily. (Patient not taking: Reported on 12/23/2020)     vortioxetine HBr (TRINTELLIX) 20 MG TABS tablet Take 1 tablet (20 mg total) by mouth daily. (Patient not taking: Reported on 03/18/2021) 90 tablet 0   No current facility-administered medications for this visit.    Review of Systems  Constitutional: Negative for chills, fatigue, fever and unexpected weight change.  HENT: Negative for trouble swallowing.  Eyes: Negative for loss of vision.  Respiratory: Negative for cough, shortness of breath and wheezing.  Cardiovascular: Negative for chest pain, leg swelling, palpitations and syncope.  GI: Negative for abdominal pain, blood in stool, diarrhea, nausea and vomiting.  GU: Negative for difficulty urinating, dysuria, frequency and hematuria.  Musculoskeletal: Negative for back pain, leg pain and joint pain.  Skin: Negative for rash.  Neurological: Positive for headaches. Negative for dizziness, light-headedness, numbness and seizures.  Psychiatric: Negative for behavioral problem, confusion, depressed mood and sleep disturbance.       Objective:  Objective   Vitals:   03/18/21 1106  BP: 118/72  Weight: 140 lb (63.5 kg)  Height: 5\' 7"  (1.702 m)   Body mass index is 21.93 kg/m.  Physical Exam Vitals and nursing note reviewed. Exam conducted with a chaperone present.  Constitutional:      Appearance: Normal appearance.  HENT:     Head: Normocephalic and atraumatic.  Eyes:     Extraocular Movements: Extraocular movements intact.     Pupils: Pupils are equal, round, and reactive to light.  Cardiovascular:     Rate and Rhythm: Normal rate and regular rhythm.  Pulmonary:     Effort:  Pulmonary effort is normal.     Breath sounds: Normal breath sounds.  Abdominal:     General: Abdomen is flat.     Palpations: Abdomen is soft.  Musculoskeletal:     Cervical back: Normal range of motion.  Skin:    General: Skin is warm and dry.  Neurological:     General: No focal deficit present.     Mental Status: She is alert and oriented to person, place, and time.  Psychiatric:        Behavior: Behavior normal.        Thought Content: Thought content normal.  Judgment: Judgment normal.    Assessment/Plan:     40 yo with menorrhagia with irregular cycle Will start POP- rx sent Labs for anemia today.  Discussed OTC supplements for migraines.  More than 5 minutes were spent face to face with the patient in the room, reviewing the medical record, labs and images, and coordinating care for the patient. The plan of management was discussed in detail and counseling was provided.       Adelene Idler MD [facility], [facility] 03/18/2021 12:10 PM

## 2021-03-19 LAB — IRON AND TIBC
Iron Saturation: 41 % (ref 15–55)
Iron: 154 ug/dL (ref 27–159)
Total Iron Binding Capacity: 373 ug/dL (ref 250–450)
UIBC: 219 ug/dL (ref 131–425)

## 2021-03-19 LAB — ESTRADIOL: Estradiol: 1101 pg/mL

## 2021-03-19 LAB — FERRITIN: Ferritin: 34 ng/mL (ref 15–150)

## 2021-03-19 LAB — FSH/LH
FSH: 1.9 m[IU]/mL
LH: 4.3 m[IU]/mL

## 2021-03-21 ENCOUNTER — Ambulatory Visit: Payer: BC Managed Care – PPO

## 2021-03-24 ENCOUNTER — Other Ambulatory Visit: Payer: Self-pay

## 2021-03-24 ENCOUNTER — Ambulatory Visit: Payer: BC Managed Care – PPO

## 2021-03-24 DIAGNOSIS — M62838 Other muscle spasm: Secondary | ICD-10-CM | POA: Diagnosis not present

## 2021-03-24 DIAGNOSIS — M6281 Muscle weakness (generalized): Secondary | ICD-10-CM

## 2021-03-24 DIAGNOSIS — R262 Difficulty in walking, not elsewhere classified: Secondary | ICD-10-CM

## 2021-03-24 DIAGNOSIS — R269 Unspecified abnormalities of gait and mobility: Secondary | ICD-10-CM

## 2021-03-24 NOTE — Therapy (Signed)
[place] MAIN [facility] SERVICES [address] [place], [place], [address] Phone: [phone]   Fax:  [phone]  Physical Therapy Treatment  Patient Details  Name: Christine Chavez MRN: [id number] Date of Birth: 11/20/1980 Referring Provider (PT): Elza Rafter   Encounter Date: 03/24/2021   PT End of Session - 03/24/21 1203     Visit Number 139    Number of Visits 161    Date for PT Re-Evaluation 05/30/21    Authorization Type 7/10 PN 01/31/21    Progress Note Due on Visit 130    PT Start Time 1105    PT Stop Time 1150    PT Time Calculation (min) 45 min    Equipment Utilized During Treatment Gait belt    Activity Tolerance Patient tolerated treatment well;Patient limited by fatigue    Behavior During Therapy WFL for tasks assessed/performed             Past Medical History:  Diagnosis Date   Complication of anesthesia    ? seizures after anesthesia    Headache    Migraines    Neurogenic bladder    Renal disorder    Vision abnormalities     Past Surgical History:  Procedure Laterality Date   ANTERIOR CRUCIATE LIGAMENT REPAIR  1997   APPENDECTOMY     COLONOSCOPY WITH PROPOFOL N/A 11/11/2018   Procedure: COLONOSCOPY WITH PROPOFOL;  Surgeon: [name], MD;  Location: [facility] ENDOSCOPY;  Service: Endoscopy;  Laterality: N/A;   CYSTOSCOPY WITH STENT PLACEMENT Right 04/17/2016   Procedure: CYSTOSCOPY WITH STENT PLACEMENT;  Surgeon: [name], MD;  Location: [facility] ORS;  Service: Urology;  Laterality: Right;   ESOPHAGOGASTRODUODENOSCOPY (EGD) WITH PROPOFOL N/A 11/11/2018   Procedure: ESOPHAGOGASTRODUODENOSCOPY (EGD) WITH PROPOFOL;  Surgeon: [name], MD;  Location: [facility] ENDOSCOPY;  Service: Endoscopy;  Laterality: N/A;   EXPLORATORY LAPAROTOMY  1999   KIDNEY STONE SURGERY  04/2016   REVISION UROSTOMY CUTANEOUS     REVISION UROSTOMY CUTANEOUS  01/10/2018   SUPRAPUBIC CATHETER PLACEMENT  08/2017    TONSILLECTOMY      There were no vitals filed for this visit.   Subjective Assessment - 03/24/21 1106     Subjective Patient reports increased flank and low back pain, likely due to UTI. She went to [facility] yesterday and confirmed UTI but has not started antibiotics yet.    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 Due to the rarity of her disorder it took years prior to diagnosis. 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  Patient has good days and bad days. Her good days allow for short ambulation (within room only not between rooms) and her bad days she is unable to ambulate.    Limitations Lifting;Standing;Walking;House hold activities;Other (comment)    How long can you stand comfortably? limited on bad days    How long can you walk comfortably? within room only on good days, unable on bad days    Patient Stated Goals to regain PLOF    Currently in Pain? Yes    Pain Score 6     Pain Location Back    Pain Orientation Mid    Pain Descriptors / Indicators Sharp;Aching    Pain Type  Chronic pain;Acute pain    Pain Onset 1 to 4 weeks ago             Standing at support surface with BUE support and min A: Lateral weight shifts  focusing on maintaining upright and knee stability with 5sec holds on each LE. Blocking to L knee by SPT provided. Significant  L knee wobble noted.  Prone with lumbar moist heat: Donkey kicks x12 each LE Hamstring curls x12   Seated: BTB hamstring curls 2x10 each LE, set 2 focusing on eccentric control with 3sec hold of contraction. LAQs with RTB loop 1x12 each LE BTB rows 1x12 cuing for scap squeeze Pelvic tilts in WC; practicing full anterior> full  posterior tilt then recognizing neutral pelvis x91min.  Education provided on Lumbar roll for maintaining neutral pelvis in manual WC.  Pt educated throughout session about proper posture and technique with exercises. Improved exercise technique, movement at target joints, use of target muscles after min to mod verbal, visual, tactile cues.  Patient presents to PT with excellent effort and motivation despite increased flank pain and current UTI. Standing TherEx was challenging due to significant L knee instability and wobble. Patient requires increased time but tolerated seated and prone TherEx well today and will continue to benefit from skilled PT to address strength, balance, and gait deviations to improve QOL and safety with functional mobility.                            PT Education - 03/24/21 1202     Education Details exercise technique, lumbar roll support, finding pelvic neutral, eccentric vs concentric strengthening.    Person(s) Educated Patient    Methods Explanation;Demonstration;Tactile cues;Verbal cues    Comprehension Verbal cues required;Verbalized understanding;Returned demonstration;Tactile cues required              PT Short Term Goals - 12/09/20 1201       PT SHORT TERM GOAL #1   Title Patient will demonstrate coordinated diaphragmatic breathing with pelvic tilts to demonstrate improved control of diaphragm and TA, to allow for further strengthening of core musculature and decreased pelvic floor spasm.    Baseline Pt. demonstrates breathing dysfunction and poor PFM coordination evidenced by anal manometry As of 2/22: Pt. continues to have restriction in her diaphragm and near T/L junction but is able to intentionally use diaphragmatic breathing through the available ROM.    Time 5    Period Weeks    Status Achieved    Target Date 04/20/19      PT SHORT TERM GOAL #2   Title Patient will report a reduction in pain to no greater than 6/10  over the prior week to demonstrate symptom improvement.    Baseline Pain is 10/10 at worst, 2/10 at best As of 2/22: Pt. had achieved this goal for 1 week as of 1/21 but may have infection or other insult that caused pain to increase again. As of 4/29: Pain high of 8/10 but has been over-doing her activity over the past week. As of 6/10: Pt. was able to keep pain at 6 or below since prior visit by using TENS to help keep tension lower but has not had a full week to test if it will keep working. As of 7/6: Pt. had met this goal but has a new UTI and it caused pain to spike to 8/10 over the weekend. 8/25: 6/10 due to back pain  Time 5    Period Weeks    Status Achieved    Target Date 01/13/21      PT SHORT TERM GOAL #3   Title Patient will demonstrate HEP x1 in the clinic to demonstrate understanding and proper form to allow for further improvement.    Baseline Pt. lacks knowledge of therepeutic exercises that can decrease her pain/Sx.    Time 5    Period Weeks    Status Achieved    Target Date 04/20/19      PT SHORT TERM GOAL #4   Title Patient will report consistent use of foot-stool (squatty-potty) for positioning with BM to decrease pain with BM and intra-abdominal pressure.    Baseline Pt. having constipation due to PFM dysfunction    Time 5    Period Weeks    Status Achieved    Target Date 04/20/19               PT Long Term Goals - 03/07/21 0001       PT LONG TERM GOAL #1   Title Patient will be independent with manual wheelchair mobility in public location such as store for increased independence.    Baseline 11/21: unable to push herself around entire store yet    Time 12    Period Weeks    Status New    Target Date 05/30/21      PT LONG TERM GOAL #2   Title Patient will increase 10 meter walk test to <30 seconds as to improve gait speed for better community ambulation and to reduce fall risk.    Baseline 6/2: 1 min 18 seconds with rollator 8/25: deferred due to  excessive knee buckling during TUG 10/17: 1 min 38 seconds with frequent knee buckling 11/21: 1 min 11 seconds with knee buckling with rollator    Time 12    Period Weeks    Status Partially Met    Target Date 05/30/21      PT LONG TERM GOAL #3   Title Patient (< 15 years old) will complete five times sit to stand test in < 20 seconds indicating an increased LE strength and improved balance.    Baseline 6/2: 43 seconds with heavy BUE support 8/25: 40 seconds with SUE support 10/17: 21 seconds with SUE support 11/21: 20 seconds with SUE support    Time 12    Period Weeks    Status Partially Met    Target Date 05/30/21      PT LONG TERM GOAL #4   Title Pt. will be able to perform 1 hr. of light housework reqiring intermittent standing and walking without increased pain/fatigue >2 pts.    Baseline Pt. becomes fatigued washing fruits/vegetbles in small stages and has fatigue>3 pts. 3/14: able to perform but needs rest breaks 4/25: able to do but is fatiguing 6/2: able to perform on good days; unable to perform on bad days 8/25: 4-5/10 10/17: 40% of the time    Time 12    Period Weeks    Status Partially Met    Target Date 05/30/21      PT LONG TERM GOAL #5   Title Patient will reduce timed up and go to <30 seconds to reduce fall risk and demonstrate improved transfer/gait ability.    Baseline 8/25: 1 min 47 seconds with rollator 10/17: 1 min 2 seconds 11/21: 59 seconds    Time 12    Period Weeks    Status Partially Met  Target Date 05/30/21      PT LONG TERM GOAL #7   Title Patient will attend two consecutive athletic events for her children indicating improved capacity for activities and quality of life.    Baseline 8/25: can only attend one at a time 10/17: 1.5 seconds 11/21: able to attend in power chair not yet in manual chair    Time 12    Period Weeks    Status Partially Met    Target Date 05/30/21                   Plan - 03/24/21 1204     Clinical Impression  Statement Patient presents to PT with excellent effort and motivation despite increased flank pain and current UTI. Standing TherEx was challenging due to significant L knee instability and wobble. Patient tolerated seated and prone TherEx well today and will continue to benefit from skilled PT to address strength, balance, and gait deviations to improve QOL and safety with functional mobility.    Personal Factors and Comorbidities Comorbidity 3+;Transportation;Time since onset of injury/illness/exacerbation;Finances    Comorbidities neurologic insult with pending diagnosis, multiple recurrent UTI's, BLE weakness, depression, hereditary spastic paraplegia    Examination-Activity Limitations Caring for Others;Carry;Continence;Squat;Stairs;Toileting;Locomotion Level;Lift;Stand;Bed Mobility;Bend;Reach Overhead;Sit;Dressing;Transfers    Examination-Participation Restrictions Church;Cleaning;Community Activity;Driving;Interpersonal Relationship;Laundry;[employer] Work;Meal Prep;School;Volunteer    Stability/Clinical Decision Making Unstable/Unpredictable    Rehab Potential Fair    PT Frequency 2x / week    PT Duration 12 weeks    PT Treatment/Interventions ADLs/Self Care Home Management;Aquatic Therapy;Biofeedback;Moist Heat;Electrical Stimulation;Cryotherapy;Traction;Ultrasound;Therapeutic activities;Functional mobility training;Stair training;Gait training;Therapeutic exercise;Balance training;Neuromuscular re-education;Patient/family education;Manual techniques;Dry needling;Scar mobilization;Energy conservation;Taping;Joint Manipulations;Spinal Manipulations;Visual/perceptual remediation/compensation;Passive range of motion;Wheelchair mobility training;DME Instruction;Orthotic Fit/Training    PT Next Visit Plan transfers, stabilization in standing, core activation    PT Home Exercise Plan Added Quad sets;Access Code: [email or web address] URL: [email or web address]    Consulted and Agree with Plan of Care  Patient             Patient will benefit from skilled therapeutic intervention in order to improve the following deficits and impairments:  Abnormal gait, Decreased balance, Decreased endurance, Decreased mobility, Difficulty walking, Increased muscle spasms, Impaired sensation, Improper body mechanics, Impaired tone, Decreased activity tolerance, Decreased coordination, Decreased strength, Postural dysfunction, Pain, Impaired flexibility  Visit Diagnosis: Abnormality of gait and mobility  Muscle weakness (generalized)  Difficulty in walking, not elsewhere classified     Problem List Patient Active Problem List   Diagnosis Date Noted   Seizure ([place]) 11/11/2018   Major depressive disorder, recurrent episode, moderate ([place]) 02/07/2018   Nephrolithiasis 04/16/2016   Numbness 07/28/2015   Bladder retention 06/23/2015   Abdominal pain 06/04/2015   Dizziness 05/18/2015   Neck pain 85/63/1497   Complicated migraine 02/63/7858   Other fatigue 04/28/2015   Abnormal finding on MRI of brain 04/28/2015   D (diarrhea) 03/29/2015   H/O disease 03/29/2015   Abnormal weight loss 03/29/2015   Muscle weakness (generalized) 03/14/2015   Headache, migraine 03/10/2015   Arsenio Katz, SPT   This entire session was performed under direct supervision and direction of a licensed therapist/therapist assistant . I have personally read, edited and approve of the note as written.  Janna Arch, PT, DPT  03/24/2021, 12:08 PM  [place] MAIN [facility] SERVICES [address] [place], [place], [address] Phone: [phone]   Fax:  [phone]  Name: Christine Chavez MRN: [id number] Date of Birth: [birth year]

## 2021-03-28 ENCOUNTER — Ambulatory Visit: Payer: BC Managed Care – PPO

## 2021-03-31 ENCOUNTER — Ambulatory Visit: Payer: BC Managed Care – PPO

## 2021-03-31 ENCOUNTER — Other Ambulatory Visit: Payer: Self-pay

## 2021-03-31 DIAGNOSIS — R269 Unspecified abnormalities of gait and mobility: Secondary | ICD-10-CM

## 2021-03-31 DIAGNOSIS — M62838 Other muscle spasm: Secondary | ICD-10-CM | POA: Diagnosis not present

## 2021-03-31 DIAGNOSIS — M6281 Muscle weakness (generalized): Secondary | ICD-10-CM

## 2021-03-31 NOTE — Therapy (Signed)
[place] MAIN [facility] SERVICES [address] [place], [place], [address] Phone: [phone]   Fax:  [phone]  Physical Therapy Treatment/ Progress Note   Dates of reporting period  03/07/2021   to   03/31/2021   Patient Details  Name: Christine Chavez MRN: [id number] Date of Birth: 1981-02-19 Referring Provider (PT): Elza Rafter   Encounter Date: 03/31/2021   PT End of Session - 03/31/21 1059     Visit Number 140    Number of Visits 161    Date for PT Re-Evaluation 05/30/21    Authorization Type 7/10 PN 01/31/21    Progress Note Due on Visit 130    PT Start Time 1100    PT Stop Time 1144    PT Time Calculation (min) 44 min    Equipment Utilized During Treatment Gait belt    Activity Tolerance Patient tolerated treatment well;Patient limited by fatigue    Behavior During Therapy WFL for tasks assessed/performed             Past Medical History:  Diagnosis Date   Complication of anesthesia    ? seizures after anesthesia    Headache    Migraines    Neurogenic bladder    Renal disorder    Vision abnormalities     Past Surgical History:  Procedure Laterality Date   ANTERIOR CRUCIATE LIGAMENT REPAIR  1997   APPENDECTOMY     COLONOSCOPY WITH PROPOFOL N/A 11/11/2018   Procedure: COLONOSCOPY WITH PROPOFOL;  Surgeon: [name], MD;  Location: [facility] ENDOSCOPY;  Service: Endoscopy;  Laterality: N/A;   CYSTOSCOPY WITH STENT PLACEMENT Right 04/17/2016   Procedure: CYSTOSCOPY WITH STENT PLACEMENT;  Surgeon: [name], MD;  Location: [facility] ORS;  Service: Urology;  Laterality: Right;   ESOPHAGOGASTRODUODENOSCOPY (EGD) WITH PROPOFOL N/A 11/11/2018   Procedure: ESOPHAGOGASTRODUODENOSCOPY (EGD) WITH PROPOFOL;  Surgeon: [name], MD;  Location: [facility] ENDOSCOPY;  Service: Endoscopy;  Laterality: N/A;   EXPLORATORY LAPAROTOMY  1999   KIDNEY STONE SURGERY  04/2016   REVISION UROSTOMY CUTANEOUS     REVISION  UROSTOMY CUTANEOUS  01/10/2018   SUPRAPUBIC CATHETER PLACEMENT  08/2017   TONSILLECTOMY      There were no vitals filed for this visit.   Subjective Assessment - 03/31/21 1058     Subjective Patient reports 7/10 back pain and 5/10 stiffness in legs. Patient recieved anitbiotics on Monday for UTI but reports "I honestly don't feel like it's working". She reports significant flank pain and nausea that has not subsided since starting antibiotics as well as intermittent stabbing pains in abdomen. Reports than MD directed her to go to ED if symptoms remain by end of antibiotic course (Friday).    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 Due to the rarity of her disorder it took years prior to diagnosis. 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  Patient has good days and bad days. Her good days allow for short ambulation (within room only not between rooms) and her bad days she is unable to ambulate.    Limitations Lifting;Standing;Walking;House hold activities;Other (comment)    How long can you stand comfortably? limited on bad days    How long can you walk comfortably? within  room only on good days, unable on bad days    Patient Stated Goals to regain PLOF    Currently in Pain? Yes    Pain Score 7     Pain Location Flank    Pain Descriptors / Indicators Aching;Burning;Sharp    Pain Onset 1 to 4 weeks ago             Patient reports nurse/MD office told her to take antibiotics for 5 days and if symptoms are not improving then she may need IV antibiotics. She is currently on day 4/5 and has not experienced symptom relief.  Patient oral temperature: 98.4 degrees  Patient complained on  intermittent itching and burning around urostomy bag site; abdominal skin inspection was negative for erythema, rash, or swelling.  Manual, pt lying on moist heat pack:  Passive Hamstring stretch 4x30sec each LE Passive popliteal angle hamstring stretch 3x30sec each LE Passive single knee to chest x4 each LE; requires slow progression 2/2 increased hamstring tone LLE>RLE Passive calf stretch 2x20sec LLE  Supine, hooklying, STM and TrP release to glute max/med and sacral attachments x62mn each LE with passive lower trunk rotation  Right Sidelying; STM with implementation of effleurage and petrissage to lumbar and mid thoracic paraspinals, quadratus lumborum, and multifidus x112m  Sit to Stand and Stand pivot transfers from Transport chair<>mat table and back today demonstrated increased LLE weakness, significant knee wobble, and difficulty clearing ground with BLE during pivot steps.   Pt educated throughout session about proper posture and technique with exercises. Improved exercise technique, movement at target joints, use of target muscles after min to mod verbal, visual, tactile cues.   Patient presents to PT with significant pain and stiffness from current UTI. Today's session focused primarily on relaxation techniques and manual STM and stretching to mitigate spasticity, however flank pain did not respond to manual interventions. Patient was encouraged repeatedly by PT and SPT to seek medical attention at ED per MD's suggestion since UTI symptoms have not lessened and mild confusion and brain fog have been reported by patient. Long term functional performance goals were not assessed today secondary to patient's pain, nausea, and UTI symptoms.Patient's condition has the potential to improve in response to therapy. Maximum improvement is yet to be obtained. The anticipated improvement is attainable and reasonable in a generally predictable time. Although functional goals were deferred today,  patient reports increased independence with WC mobility prior to experiencing current UTI. Pt will continue to benefit from skilled PT to increase strength and balance to improve independence and safety with functional mobility to achieve goals and ability to participate in community and family care giving.                           PT Education - 03/31/21 1253     Education Details symptom monitoring and benefit of seeking medical attention for UTI symptoms soon. Pt was encouraged by PT and SPT to visit ED today since symptoms have been worsening.    Person(s) Educated Patient    Methods Explanation    Comprehension Verbalized understanding              PT Short Term Goals - 12/09/20 1201       PT SHORT TERM GOAL #1   Title Patient will demonstrate coordinated diaphragmatic breathing with pelvic tilts to demonstrate improved control of diaphragm and TA, to allow for further strengthening of core musculature and decreased pelvic floor spasm.  Baseline Pt. demonstrates breathing dysfunction and poor PFM coordination evidenced by anal manometry As of 2/22: Pt. continues to have restriction in her diaphragm and near T/L junction but is able to intentionally use diaphragmatic breathing through the available ROM.    Time 5    Period Weeks    Status Achieved    Target Date 04/20/19      PT SHORT TERM GOAL #2   Title Patient will report a reduction in pain to no greater than 6/10 over the prior week to demonstrate symptom improvement.    Baseline Pain is 10/10 at worst, 2/10 at best As of 2/22: Pt. had achieved this goal for 1 week as of 1/21 but may have infection or other insult that caused pain to increase again. As of 4/29: Pain high of 8/10 but has been over-doing her activity over the past week. As of 6/10: Pt. was able to keep pain at 6 or below since prior visit by using TENS to help keep tension lower but has not had a full week to test if it will keep working.  As of 7/6: Pt. had met this goal but has a new UTI and it caused pain to spike to 8/10 over the weekend. 8/25: 6/10 due to back pain    Time 5    Period Weeks    Status Achieved    Target Date 01/13/21      PT SHORT TERM GOAL #3   Title Patient will demonstrate HEP x1 in the clinic to demonstrate understanding and proper form to allow for further improvement.    Baseline Pt. lacks knowledge of therepeutic exercises that can decrease her pain/Sx.    Time 5    Period Weeks    Status Achieved    Target Date 04/20/19      PT SHORT TERM GOAL #4   Title Patient will report consistent use of foot-stool (squatty-potty) for positioning with BM to decrease pain with BM and intra-abdominal pressure.    Baseline Pt. having constipation due to PFM dysfunction    Time 5    Period Weeks    Status Achieved    Target Date 04/20/19               PT Long Term Goals - 03/31/21 0001       PT LONG TERM GOAL #1   Title Patient will be independent with manual wheelchair mobility in public location such as store for increased independence.    Baseline 11/21: unable to push herself around entire store yet 12/15: Patient reports being able to push herself around store independently but requires multiple rest breaks and assistance with managing shopping basket/items.    Time 12    Period Weeks    Status Partially Met    Target Date 05/30/21      PT LONG TERM GOAL #2   Title Patient will increase 10 meter walk test to <30 seconds as to improve gait speed for better community ambulation and to reduce fall risk.    Baseline 6/2: 1 min 18 seconds with rollator 8/25: deferred due to excessive knee buckling during TUG 10/17: 1 min 38 seconds with frequent knee buckling 11/21: 1 min 11 seconds with knee buckling with rollator 12/15: deferred secondary to pain    Time 12    Period Weeks    Status On-going    Target Date 05/30/21      PT LONG TERM GOAL #3   Title Patient (< 60  years old) will complete  five times sit to stand test in < 20 seconds indicating an increased LE strength and improved balance.    Baseline 6/2: 43 seconds with heavy BUE support 8/25: 40 seconds with SUE support 10/17: 21 seconds with SUE support 11/21: 20 seconds with SUE support 12/15: deferred secondary to pain    Time 12    Period Weeks    Status Partially Met    Target Date 05/30/21      PT LONG TERM GOAL #4   Title Pt. will be able to perform 1 hr. of light housework reqiring intermittent standing and walking without increased pain/fatigue >2 pts.    Baseline Pt. becomes fatigued washing fruits/vegetbles in small stages and has fatigue>3 pts. 3/14: able to perform but needs rest breaks 4/25: able to do but is fatiguing 6/2: able to perform on good days; unable to perform on bad days 8/25: 4-5/10 10/17: 40% of the time 12/15: not addressed this session    Time 12    Period Weeks    Status Partially Met      PT LONG TERM GOAL #5   Title Patient will reduce timed up and go to <30 seconds to reduce fall risk and demonstrate improved transfer/gait ability.    Baseline 8/25: 1 min 47 seconds with rollator 10/17: 1 min 2 seconds 11/21: 59 seconds 12/15: deferred secondary to pain    Time 12    Period Weeks    Status Partially Met    Target Date 05/30/21      PT LONG TERM GOAL #7   Title Patient will attend two consecutive athletic events for her children indicating improved capacity for activities and quality of life.    Baseline 8/25: can only attend one at a time 10/17: 1.5 seconds 11/21: able to attend in power chair not yet in manual chair 12/15: patient has not been able to attend multiple events in manual chair yet due to fatigue and discomfort    Time 12    Period Weeks    Status Partially Met    Target Date 05/30/21                   Plan - 03/31/21 1255     Clinical Impression Statement Patient presents to PT with significant pain and stiffness from current UTI. Today's session focused  primarily on relaxation techniques and manual STM and stretching to mitigate spasticity, however flank pain did not respond to manual interventions. Patient was encouraged repeatedly by PT and SPT to seek medical attention at ED per MD's suggestion since UTI symptoms have not lessened and mild confusion and brain fog have been reported by patient. Long term functional performance goals were not assessed today secondary to patient's pain, nausea, and UTI symptoms.    Personal Factors and Comorbidities Comorbidity 3+;Transportation;Time since onset of injury/illness/exacerbation;Finances    Comorbidities neurologic insult with pending diagnosis, multiple recurrent UTI's, BLE weakness, depression, hereditary spastic paraplegia    Examination-Activity Limitations Caring for Others;Carry;Continence;Squat;Stairs;Toileting;Locomotion Level;Lift;Stand;Bed Mobility;Bend;Reach Overhead;Sit;Dressing;Transfers    Examination-Participation Restrictions Church;Cleaning;Community Activity;Driving;Interpersonal Relationship;Laundry;[employer] Work;Meal Prep;School;Volunteer    Stability/Clinical Decision Making Unstable/Unpredictable    Rehab Potential Fair    PT Frequency 2x / week    PT Duration 12 weeks    PT Treatment/Interventions ADLs/Self Care Home Management;Aquatic Therapy;Biofeedback;Moist Heat;Electrical Stimulation;Cryotherapy;Traction;Ultrasound;Therapeutic activities;Functional mobility training;Stair training;Gait training;Therapeutic exercise;Balance training;Neuromuscular re-education;Patient/family education;Manual techniques;Dry needling;Scar mobilization;Energy conservation;Taping;Joint Manipulations;Spinal Manipulations;Visual/perceptual remediation/compensation;Passive range of motion;Wheelchair mobility training;DME Instruction;Orthotic Fit/Training    PT Next  Visit Plan transfers, stabilization in standing, core activation    PT Home Exercise Plan Added Quad sets;Access Code: [email or web address] URL:  [email or web address]    Consulted and Agree with Plan of Care Patient             Patient will benefit from skilled therapeutic intervention in order to improve the following deficits and impairments:  Abnormal gait, Decreased balance, Decreased endurance, Decreased mobility, Difficulty walking, Increased muscle spasms, Impaired sensation, Improper body mechanics, Impaired tone, Decreased activity tolerance, Decreased coordination, Decreased strength, Postural dysfunction, Pain, Impaired flexibility  Visit Diagnosis: Other muscle spasm  Abnormality of gait and mobility     Problem List Patient Active Problem List   Diagnosis Date Noted   Seizure ([place]) 11/11/2018   Major depressive disorder, recurrent episode, moderate ([place]) 02/07/2018   Nephrolithiasis 04/16/2016   Numbness 07/28/2015   Bladder retention 06/23/2015   Abdominal pain 06/04/2015   Dizziness 05/18/2015   Neck pain 79/48/0165   Complicated migraine 53/74/8270   Other fatigue 04/28/2015   Abnormal finding on MRI of brain 04/28/2015   D (diarrhea) 03/29/2015   H/O disease 03/29/2015   Abnormal weight loss 03/29/2015   Muscle weakness (generalized) 03/14/2015   Headache, migraine 03/10/2015   Arsenio Katz, SPT  This entire session was performed under direct supervision and direction of a licensed therapist/therapist assistant . I have personally read, edited and approve of the note as written.  Janna Arch, PT, DPT  03/31/2021, 1:22 PM  [place] MAIN [facility] SERVICES [address] [place], [place], [address] Phone: [phone]   Fax:  [phone]  Name: Renne Cornick MRN: [id number] Date of Birth: 1980/12/11

## 2021-04-04 ENCOUNTER — Ambulatory Visit: Payer: BC Managed Care – PPO

## 2021-04-07 ENCOUNTER — Other Ambulatory Visit: Payer: Self-pay

## 2021-04-07 ENCOUNTER — Ambulatory Visit: Payer: BC Managed Care – PPO

## 2021-04-07 DIAGNOSIS — M62838 Other muscle spasm: Secondary | ICD-10-CM | POA: Diagnosis not present

## 2021-04-07 DIAGNOSIS — M6281 Muscle weakness (generalized): Secondary | ICD-10-CM

## 2021-04-07 DIAGNOSIS — R269 Unspecified abnormalities of gait and mobility: Secondary | ICD-10-CM

## 2021-04-07 NOTE — Therapy (Signed)
[place] [facility] SERVICES [address] [place], [place], [address] Phone: [phone]   Fax:  [phone]  Physical Therapy Treatment  Patient Details  Name: Christine Chavez MRN: [id number] Date of Birth: 09/25/1980 Referring Provider (PT): Elza Rafter   Encounter Date: 04/07/2021   PT End of Session - 04/07/21 1242     Visit Number 141    Number of Visits 161    Date for PT Re-Evaluation 05/30/21    Progress Note Due on Visit 130    PT Start Time 1100    PT Stop Time 1146    PT Time Calculation (min) 46 min    Equipment Utilized During Treatment Gait belt    Activity Tolerance Patient tolerated treatment well;Patient limited by fatigue    Behavior During Therapy WFL for tasks assessed/performed             Past Medical History:  Diagnosis Date   Complication of anesthesia    ? seizures after anesthesia    Headache    Migraines    Neurogenic bladder    Renal disorder    Vision abnormalities     Past Surgical History:  Procedure Laterality Date   ANTERIOR CRUCIATE LIGAMENT REPAIR  1997   APPENDECTOMY     COLONOSCOPY WITH PROPOFOL N/A 11/11/2018   Procedure: COLONOSCOPY WITH PROPOFOL;  Surgeon: [name], MD;  Location: [facility] ENDOSCOPY;  Service: Endoscopy;  Laterality: N/A;   CYSTOSCOPY WITH STENT PLACEMENT Right 04/17/2016   Procedure: CYSTOSCOPY WITH STENT PLACEMENT;  Surgeon: [name], MD;  Location: [facility] ORS;  Service: Urology;  Laterality: Right;   ESOPHAGOGASTRODUODENOSCOPY (EGD) WITH PROPOFOL N/A 11/11/2018   Procedure: ESOPHAGOGASTRODUODENOSCOPY (EGD) WITH PROPOFOL;  Surgeon: [name], MD;  Location: [facility] ENDOSCOPY;  Service: Endoscopy;  Laterality: N/A;   EXPLORATORY LAPAROTOMY  1999   KIDNEY STONE SURGERY  04/2016   REVISION UROSTOMY CUTANEOUS     REVISION UROSTOMY CUTANEOUS  01/10/2018   SUPRAPUBIC CATHETER PLACEMENT  08/2017   TONSILLECTOMY      There were no vitals  filed for this visit.   Subjective Assessment - 04/07/21 1238     Subjective Patient has gone to ED since last session with PT encouragement, still dealing with increased pain. Back pain still is present. Will see gynecology in the next week.    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 Due to the rarity of her disorder it took years prior to diagnosis. 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  Patient has good days and bad days. Her good days allow for short ambulation (within room only not between rooms) and her bad days she is unable to ambulate.    Limitations Lifting;Standing;Walking;House hold activities;Other (comment)    How long can you stand comfortably? limited on bad days    How long can you walk comfortably? within room only on good days, unable on bad days    Patient Stated Goals to regain PLOF    Currently in Pain? Yes    Pain Score 6     Pain Location Back    Pain Orientation Mid    Pain Descriptors / Indicators Aching    Pain Type Chronic pain;Acute pain  Pain Onset 1 to 4 weeks ago    Pain Frequency Constant               Patient has gone to ED since last session, still dealing with increased pain. Back pain still is present. Will see gynecology in the next week.       Treatment:   Prone  Donkey kicks x12 each LE-terminated due to pain  Hamstring curls x12  STM to bilateral piriformis x 4 minutes for tension reduction CPA and UPA grade II for pain reduction to thoracolumbar spine 5 seconds each level x 2     Sidelying: Clamshells 15x each side   Supine:   hamstring lengthening on PT shoulder 60 seconds Single knee to chest 60 seconds each  LE Piriformis stretch 60 seconds each LE-painful to R hip Distraction SAD with belt to R hip 5x20 seconds Posterior pelvic tilt 10x 3 second holds Bridges 10x Hip adduction 15x 3 second holds  Car transfer with min A for STS.   Pt educated throughout session about proper posture and technique with exercises. Improved exercise technique, movement at target joints, use of target muscles after min to mod verbal, visual, tactile cues.     Patient presents with excellent motivation despite her current medical complications. She has improved tone throughout session in her LE's and patient reports slight relief by end of session. Opening interventions for R hip performed due to patient reporting pinching pain. Patient will continue to benefit from skilled PT to address gait deficits, standing and mobility tolerance, and strength to improve quality of life and level of independence with functional mobility and ADLs.            PT Education - 04/07/21 1240     Education Details pain reduction    Person(s) Educated Patient    Methods Explanation;Tactile cues;Demonstration;Verbal cues    Comprehension Verbalized understanding;Returned demonstration;Verbal cues required;Tactile cues required              PT Short Term Goals - 12/09/20 1201       PT SHORT TERM GOAL #1   Title Patient will demonstrate coordinated diaphragmatic breathing with pelvic tilts to demonstrate improved control of diaphragm and TA, to allow for further strengthening of core musculature and decreased pelvic floor spasm.    Baseline Pt. demonstrates breathing dysfunction and poor PFM coordination evidenced by anal manometry As of 2/22: Pt. continues to have restriction in her diaphragm and near T/L junction but is able to intentionally use diaphragmatic breathing through the available ROM.    Time 5    Period Weeks    Status Achieved    Target Date 04/20/19      PT SHORT TERM GOAL #2   Title Patient will  report a reduction in pain to no greater than 6/10 over the prior week to demonstrate symptom improvement.    Baseline Pain is 10/10 at worst, 2/10 at best As of 2/22: Pt. had achieved this goal for 1 week as of 1/21 but may have infection or other insult that caused pain to increase again. As of 4/29: Pain high of 8/10 but has been over-doing her activity over the past week. As of 6/10: Pt. was able to keep pain at 6 or below since prior visit by using TENS to help keep tension lower but has not had a full week to Chavez if it will keep working. As of 7/6: Pt. had met this goal but has a new UTI and  it caused pain to spike to 8/10 over the weekend. 8/25: 6/10 due to back pain    Time 5    Period Weeks    Status Achieved    Target Date 01/13/21      PT SHORT TERM GOAL #3   Title Patient will demonstrate HEP x1 in the clinic to demonstrate understanding and proper form to allow for further improvement.    Baseline Pt. lacks knowledge of therepeutic exercises that can decrease her pain/Sx.    Time 5    Period Weeks    Status Achieved    Target Date 04/20/19      PT SHORT TERM GOAL #4   Title Patient will report consistent use of foot-stool (squatty-potty) for positioning with BM to decrease pain with BM and intra-abdominal pressure.    Baseline Pt. having constipation due to PFM dysfunction    Time 5    Period Weeks    Status Achieved    Target Date 04/20/19               PT Long Term Goals - 03/31/21 0001       PT LONG TERM GOAL #1   Title Patient will be independent with manual wheelchair mobility in public location such as store for increased independence.    Baseline 11/21: unable to push herself around entire store yet 12/15: Patient reports being able to push herself around store independently but requires multiple rest breaks and assistance with managing shopping basket/items.    Time 12    Period Weeks    Status Partially Met    Target Date 05/30/21      PT LONG TERM GOAL  #2   Title Patient will increase 10 meter walk Chavez to <30 seconds as to improve gait speed for better community ambulation and to reduce fall risk.    Baseline 6/2: 1 min 18 seconds with rollator 8/25: deferred due to excessive knee buckling during TUG 10/17: 1 min 38 seconds with frequent knee buckling 11/21: 1 min 11 seconds with knee buckling with rollator 12/15: deferred secondary to pain    Time 12    Period Weeks    Status On-going    Target Date 05/30/21      PT LONG TERM GOAL #3   Title Patient (< 35 years old) will complete five times sit to stand Chavez in < 20 seconds indicating an increased LE strength and improved balance.    Baseline 6/2: 43 seconds with heavy BUE support 8/25: 40 seconds with SUE support 10/17: 21 seconds with SUE support 11/21: 20 seconds with SUE support 12/15: deferred secondary to pain    Time 12    Period Weeks    Status Partially Met    Target Date 05/30/21      PT LONG TERM GOAL #4   Title Pt. will be able to perform 1 hr. of light housework reqiring intermittent standing and walking without increased pain/fatigue >2 pts.    Baseline Pt. becomes fatigued washing fruits/vegetbles in small stages and has fatigue>3 pts. 3/14: able to perform but needs rest breaks 4/25: able to do but is fatiguing 6/2: able to perform on good days; unable to perform on bad days 8/25: 4-5/10 10/17: 40% of the time 12/15: not addressed this session    Time 12    Period Weeks    Status Partially Met      PT LONG TERM GOAL #5   Title Patient will reduce timed up  and go to <30 seconds to reduce fall risk and demonstrate improved transfer/gait ability.    Baseline 8/25: 1 min 47 seconds with rollator 10/17: 1 min 2 seconds 11/21: 59 seconds 12/15: deferred secondary to pain    Time 12    Period Weeks    Status Partially Met    Target Date 05/30/21      PT LONG TERM GOAL #7   Title Patient will attend two consecutive athletic events for her children indicating improved  capacity for activities and quality of life.    Baseline 8/25: can only attend one at a time 10/17: 1.5 seconds 11/21: able to attend in power chair not yet in manual chair 12/15: patient has not been able to attend multiple events in manual chair yet due to fatigue and discomfort    Time 12    Period Weeks    Status Partially Met    Target Date 05/30/21                   Plan - 04/07/21 1253     Clinical Impression Statement Patient presents with excellent motivation despite her current medical complications. She has improved tone throughout session in her LE's and patient reports slight relief by end of session. Opening interventions for R hip performed due to patient reporting pinching pain. Patient will continue to benefit from skilled PT to address gait deficits, standing and mobility tolerance, and strength to improve quality of life and level of independence with functional mobility and ADLs.    Personal Factors and Comorbidities Comorbidity 3+;Transportation;Time since onset of injury/illness/exacerbation;Finances    Comorbidities neurologic insult with pending diagnosis, multiple recurrent UTI's, BLE weakness, depression, hereditary spastic paraplegia    Examination-Activity Limitations Caring for Others;Carry;Continence;Squat;Stairs;Toileting;Locomotion Level;Lift;Stand;Bed Mobility;Bend;Reach Overhead;Sit;Dressing;Transfers    Examination-Participation Restrictions Church;Cleaning;Community Activity;Driving;Interpersonal Relationship;Laundry;[employer] Work;Meal Prep;School;Volunteer    Stability/Clinical Decision Making Unstable/Unpredictable    Rehab Potential Fair    PT Frequency 2x / week    PT Duration 12 weeks    PT Treatment/Interventions ADLs/Self Care Home Management;Aquatic Therapy;Biofeedback;Moist Heat;Electrical Stimulation;Cryotherapy;Traction;Ultrasound;Therapeutic activities;Functional mobility training;Stair training;Gait training;Therapeutic exercise;Balance  training;Neuromuscular re-education;Patient/family education;Manual techniques;Dry needling;Scar mobilization;Energy conservation;Taping;Joint Manipulations;Spinal Manipulations;Visual/perceptual remediation/compensation;Passive range of motion;Wheelchair mobility training;DME Instruction;Orthotic Fit/Training    PT Next Visit Plan transfers, stabilization in standing, core activation    PT Home Exercise Plan Added Quad sets;Access Code: [email or web address] URL: [email or web address]    Consulted and Agree with Plan of Care Patient             Patient will benefit from skilled therapeutic intervention in order to improve the following deficits and impairments:  Abnormal gait, Decreased balance, Decreased endurance, Decreased mobility, Difficulty walking, Increased muscle spasms, Impaired sensation, Improper body mechanics, Impaired tone, Decreased activity tolerance, Decreased coordination, Decreased strength, Postural dysfunction, Pain, Impaired flexibility  Visit Diagnosis: Other muscle spasm  Abnormality of gait and mobility  Muscle weakness (generalized)     Problem List Patient Active Problem List   Diagnosis Date Noted   Seizure ([place]) 11/11/2018   Major depressive disorder, recurrent episode, moderate ([place]) 02/07/2018   Nephrolithiasis 04/16/2016   Numbness 07/28/2015   Bladder retention 06/23/2015   Abdominal pain 06/04/2015   Dizziness 05/18/2015   Neck pain 85/92/9244   Complicated migraine 62/86/3817   Other fatigue 04/28/2015   Abnormal finding on MRI of brain 04/28/2015   D (diarrhea) 03/29/2015   H/O disease 03/29/2015   Abnormal weight loss 03/29/2015   Muscle weakness (generalized) 03/14/2015   Headache, migraine 03/10/2015  Janna Arch, PT,  DPT  04/07/2021, 12:58 PM  [place] [facility] SERVICES [address] [place], [place], [address] Phone: [phone]   Fax:  [phone]  Name: Christine Chavez MRN:  [id number] Date of Birth: [birth year]

## 2021-04-14 ENCOUNTER — Ambulatory Visit: Payer: BC Managed Care – PPO

## 2021-04-15 ENCOUNTER — Ambulatory Visit: Payer: BC Managed Care – PPO | Admitting: Obstetrics and Gynecology

## 2021-04-21 ENCOUNTER — Ambulatory Visit: Payer: BC Managed Care – PPO | Attending: Gastroenterology

## 2021-04-21 ENCOUNTER — Other Ambulatory Visit: Payer: Self-pay

## 2021-04-21 DIAGNOSIS — M6281 Muscle weakness (generalized): Secondary | ICD-10-CM | POA: Insufficient documentation

## 2021-04-21 DIAGNOSIS — M62838 Other muscle spasm: Secondary | ICD-10-CM | POA: Insufficient documentation

## 2021-04-21 DIAGNOSIS — R269 Unspecified abnormalities of gait and mobility: Secondary | ICD-10-CM | POA: Diagnosis present

## 2021-04-21 NOTE — Therapy (Signed)
[place] [facility] MAIN [facility] SERVICES [address] [place], [place], [address] Phone: [phone]   Fax:  [phone]  Physical Therapy Treatment  Patient Details  Name: Christine Chavez MRN: [id number] Date of Birth: March 19, 1981 Referring Provider (PT): Ermalinda Memos   Encounter Date: 04/21/2021   PT End of Session - 04/21/21 1626     Visit Number 142    Number of Visits 161    Date for PT Re-Evaluation 05/30/21    Progress Note Due on Visit 130    PT Start Time 1100    PT Stop Time 1148    PT Time Calculation (min) 48 min    Equipment Utilized During Treatment Gait belt    Activity Tolerance Patient tolerated treatment well;Patient limited by fatigue    Behavior During Therapy WFL for tasks assessed/performed             Past Medical History:  Diagnosis Date   Complication of anesthesia    ? seizures after anesthesia    Headache    Migraines    Neurogenic bladder    Renal disorder    Vision abnormalities     Past Surgical History:  Procedure Laterality Date   ANTERIOR CRUCIATE LIGAMENT REPAIR  1997   APPENDECTOMY     COLONOSCOPY WITH PROPOFOL N/A 11/11/2018   Procedure: COLONOSCOPY WITH PROPOFOL;  Surgeon: Christena Deem, MD;  Location: [facility] ENDOSCOPY;  Service: Endoscopy;  Laterality: N/A;   CYSTOSCOPY WITH STENT PLACEMENT Right 04/17/2016   Procedure: CYSTOSCOPY WITH STENT PLACEMENT;  Surgeon: Malen Gauze, MD;  Location: [facility] ORS;  Service: Urology;  Laterality: Right;   ESOPHAGOGASTRODUODENOSCOPY (EGD) WITH PROPOFOL N/A 11/11/2018   Procedure: ESOPHAGOGASTRODUODENOSCOPY (EGD) WITH PROPOFOL;  Surgeon: Christena Deem, MD;  Location: [facility] ENDOSCOPY;  Service: Endoscopy;  Laterality: N/A;   EXPLORATORY LAPAROTOMY  1999   KIDNEY STONE SURGERY  04/2016   REVISION UROSTOMY CUTANEOUS     REVISION UROSTOMY CUTANEOUS  01/10/2018   SUPRAPUBIC CATHETER PLACEMENT  08/2017   TONSILLECTOMY      There were no vitals  filed for this visit.   Subjective Assessment - 04/21/21 1624     Subjective Since last session patient has had to go to the emergency room again where she stayed overnight under observation.  She is going to go to physician later today as they are still not certain the cause of her pain.  Patient has been feeling overall weakness fatigue and pain.    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 Due to the rarity of her disorder it took years prior to diagnosis. 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  Patient has good days and bad days. Her good days allow for short ambulation (within room only not between rooms) and her bad days she is unable to ambulate.    Limitations Lifting;Standing;Walking;House hold activities;Other (comment)    How long can you stand comfortably? limited on bad days    How long can you walk comfortably? within room only on good days, unable on bad days    Patient Stated Goals to regain PLOF    Currently in Pain? Yes    Pain Score 7     Pain Location Other (Comment)  whole body   Pain Orientation Other (Comment)   whole body   Pain Descriptors / Indicators Aching    Pain Type Chronic pain;Acute pain    Pain Onset 1 to 4 weeks ago    Pain Frequency Constant              Transition to/from transport chair with min A due to bilateral knee buckling   Supine:  Hamstring lengthening with leg on PT shoulder 60 seconds each LE; x 2 trials  Popliteal angle 60 seconds each LE Single knee to chest 60 seconds each LE Knee to cross body shoulder 60 seconds each LE LE rotation for low back relief x 60 seconds x 4 trials Cervical side bend with overpressure at  glenohumeral joint 2x 60 seconds each direction Cervical rotation with overpressure at glenohumeral joint 2x60 seconds each LE Suboccipital release 30 seconds x 4 trials    Sidelying: -STM with implementation of effleurage and pettrisage to lumbar and thoracic paraspinals and quadratus lumborum. X 9 minutes each side.   Pt educated throughout session about proper posture and technique with exercises. Improved exercise technique, movement at target joints, use of target muscles after min to mod verbal, visual, tactile cues.   Patient presents with high tone and pain levels. Prolonged holds and use of STM reduced tone a little bit but patient's pain is still not fully controlled. Patient to see physician later today.  Patient's tone is high as well as significant muscle guarding across all aspects of back and lower extremities.  Upper traps and cervical tension additionally noted requiring prolonged holds for tension reduction.  Patient required assistance with transfers with lower extremities being unstable.  Despite patient's high pain level and increased tone she remains highly motivated. Patient will continue to benefit from skilled PT to address gait deficits, standing and mobility tolerance, and strength to improve quality of life and level of independence with functional mobility and ADLs.                            PT Education - 04/21/21 1625     Education Details Tone and pain reduction    Person(s) Educated Patient    Methods Explanation;Demonstration;Tactile cues;Verbal cues    Comprehension Verbalized understanding;Returned demonstration;Verbal cues required;Tactile cues required              PT Short Term Goals - 12/09/20 1201       PT SHORT TERM GOAL #1   Title Patient will demonstrate coordinated diaphragmatic breathing with pelvic tilts to demonstrate improved control of diaphragm and TA, to allow for further strengthening of core musculature and  decreased pelvic floor spasm.    Baseline Pt. demonstrates breathing dysfunction and poor PFM coordination evidenced by anal manometry As of 2/22: Pt. continues to have restriction in her diaphragm and near T/L junction but is able to intentionally use diaphragmatic breathing through the available ROM.    Time 5    Period Weeks    Status Achieved    Target Date 04/20/19      PT SHORT TERM GOAL #2   Title Patient will report a reduction in pain to no greater than 6/10 over the prior week to demonstrate symptom improvement.    Baseline Pain is 10/10 at worst, 2/10 at best As of 2/22: Pt. had achieved this goal for 1 week as of 1/21 but may have infection or other insult that caused pain to increase  again. As of 4/29: Pain high of 8/10 but has been over-doing her activity over the past week. As of 6/10: Pt. was able to keep pain at 6 or below since prior visit by using TENS to help keep tension lower but has not had a full week to test if it will keep working. As of 7/6: Pt. had met this goal but has a new UTI and it caused pain to spike to 8/10 over the weekend. 8/25: 6/10 due to back pain    Time 5    Period Weeks    Status Achieved    Target Date 01/13/21      PT SHORT TERM GOAL #3   Title Patient will demonstrate HEP x1 in the clinic to demonstrate understanding and proper form to allow for further improvement.    Baseline Pt. lacks knowledge of therepeutic exercises that can decrease her pain/Sx.    Time 5    Period Weeks    Status Achieved    Target Date 04/20/19      PT SHORT TERM GOAL #4   Title Patient will report consistent use of foot-stool (squatty-potty) for positioning with BM to decrease pain with BM and intra-abdominal pressure.    Baseline Pt. having constipation due to PFM dysfunction    Time 5    Period Weeks    Status Achieved    Target Date 04/20/19               PT Long Term Goals - 03/31/21 0001       PT LONG TERM GOAL #1   Title Patient will be  independent with manual wheelchair mobility in public location such as store for increased independence.    Baseline 11/21: unable to push herself around entire store yet 12/15: Patient reports being able to push herself around store independently but requires multiple rest breaks and assistance with managing shopping basket/items.    Time 12    Period Weeks    Status Partially Met    Target Date 05/30/21      PT LONG TERM GOAL #2   Title Patient will increase 10 meter walk test to <30 seconds as to improve gait speed for better community ambulation and to reduce fall risk.    Baseline 6/2: 1 min 18 seconds with rollator 8/25: deferred due to excessive knee buckling during TUG 10/17: 1 min 38 seconds with frequent knee buckling 11/21: 1 min 11 seconds with knee buckling with rollator 12/15: deferred secondary to pain    Time 12    Period Weeks    Status On-going    Target Date 05/30/21      PT LONG TERM GOAL #3   Title Patient (< 51 years old) will complete five times sit to stand test in < 20 seconds indicating an increased LE strength and improved balance.    Baseline 6/2: 43 seconds with heavy BUE support 8/25: 40 seconds with SUE support 10/17: 21 seconds with SUE support 11/21: 20 seconds with SUE support 12/15: deferred secondary to pain    Time 12    Period Weeks    Status Partially Met    Target Date 05/30/21      PT LONG TERM GOAL #4   Title Pt. will be able to perform 1 hr. of light housework reqiring intermittent standing and walking without increased pain/fatigue >2 pts.    Baseline Pt. becomes fatigued washing fruits/vegetbles in small stages and has fatigue>3 pts. 3/14: able to perform  but needs rest breaks 4/25: able to do but is fatiguing 6/2: able to perform on good days; unable to perform on bad days 8/25: 4-5/10 10/17: 40% of the time 12/15: not addressed this session    Time 12    Period Weeks    Status Partially Met      PT LONG TERM GOAL #5   Title Patient will  reduce timed up and go to <30 seconds to reduce fall risk and demonstrate improved transfer/gait ability.    Baseline 8/25: 1 min 47 seconds with rollator 10/17: 1 min 2 seconds 11/21: 59 seconds 12/15: deferred secondary to pain    Time 12    Period Weeks    Status Partially Met    Target Date 05/30/21      PT LONG TERM GOAL #7   Title Patient will attend two consecutive athletic events for her children indicating improved capacity for activities and quality of life.    Baseline 8/25: can only attend one at a time 10/17: 1.5 seconds 11/21: able to attend in power chair not yet in manual chair 12/15: patient has not been able to attend multiple events in manual chair yet due to fatigue and discomfort    Time 12    Period Weeks    Status Partially Met    Target Date 05/30/21                   Plan - 04/21/21 1627     Clinical Impression Statement Patient presents with high tone and pain levels. Prolonged holds and use of STM reduced tone a little bit but patient's pain is still not fully controlled. Patient to see physician later today.  Patient's tone is high as well as significant muscle guarding across all aspects of back and lower extremities.  Upper traps and cervical tension additionally noted requiring prolonged holds for tension reduction.  Patient required assistance with transfers with lower extremities being unstable.  Despite patient's high pain level and increased tone she remains highly motivated. Patient will continue to benefit from skilled PT to address gait deficits, standing and mobility tolerance, and strength to improve quality of life and level of independence with functional mobility and ADLs.    Personal Factors and Comorbidities Comorbidity 3+;Transportation;Time since onset of injury/illness/exacerbation;Finances    Comorbidities neurologic insult with pending diagnosis, multiple recurrent UTI's, BLE weakness, depression, hereditary spastic paraplegia     Examination-Activity Limitations Caring for Others;Carry;Continence;Squat;Stairs;Toileting;Locomotion Level;Lift;Stand;Bed Mobility;Bend;Reach Overhead;Sit;Dressing;Transfers    Examination-Participation Restrictions Church;Cleaning;Community Activity;Driving;Interpersonal Relationship;Laundry;[employer] Work;Meal Prep;School;Volunteer    Stability/Clinical Decision Making Unstable/Unpredictable    Rehab Potential Fair    PT Frequency 2x / week    PT Duration 12 weeks    PT Treatment/Interventions ADLs/Self Care Home Management;Aquatic Therapy;Biofeedback;Moist Heat;Electrical Stimulation;Cryotherapy;Traction;Ultrasound;Therapeutic activities;Functional mobility training;Stair training;Gait training;Therapeutic exercise;Balance training;Neuromuscular re-education;Patient/family education;Manual techniques;Dry needling;Scar mobilization;Energy conservation;Taping;Joint Manipulations;Spinal Manipulations;Visual/perceptual remediation/compensation;Passive range of motion;Wheelchair mobility training;DME Instruction;Orthotic Fit/Training    PT Next Visit Plan transfers, stabilization in standing, core activation    PT Home Exercise Plan Added Quad sets;Access Code: [email or web address] URL: [email or web address]    Consulted and Agree with Plan of Care Patient             Patient will benefit from skilled therapeutic intervention in order to improve the following deficits and impairments:  Abnormal gait, Decreased balance, Decreased endurance, Decreased mobility, Difficulty walking, Increased muscle spasms, Impaired sensation, Improper body mechanics, Impaired tone, Decreased activity tolerance, Decreased coordination, Decreased strength, Postural dysfunction, Pain, Impaired flexibility  Visit Diagnosis:  Other muscle spasm  Abnormality of gait and mobility  Muscle weakness (generalized)     Problem List Patient Active Problem List   Diagnosis Date Noted   Seizure ([place]) 11/11/2018   Major  depressive disorder, recurrent episode, moderate ([place]) 02/07/2018   Nephrolithiasis 04/16/2016   Numbness 07/28/2015   Bladder retention 06/23/2015   Abdominal pain 06/04/2015   Dizziness 05/18/2015   Neck pain 95/36/9223   Complicated migraine 00/97/9499   Other fatigue 04/28/2015   Abnormal finding on MRI of brain 04/28/2015   D (diarrhea) 03/29/2015   H/O disease 03/29/2015   Abnormal weight loss 03/29/2015   Muscle weakness (generalized) 03/14/2015   Headache, migraine 03/10/2015    Janna Arch, PT, DPT  04/21/2021, 4:28 PM  [place] MAIN [facility] SERVICES [address] [place], [place], [address] Phone: [phone]   Fax:  [phone]  Name: Kailei Cowens MRN: [id number] Date of Birth: 31-Aug-1980

## 2021-04-25 ENCOUNTER — Ambulatory Visit: Payer: BC Managed Care – PPO

## 2021-04-25 ENCOUNTER — Other Ambulatory Visit: Payer: Self-pay

## 2021-04-25 DIAGNOSIS — M6281 Muscle weakness (generalized): Secondary | ICD-10-CM

## 2021-04-25 DIAGNOSIS — M62838 Other muscle spasm: Secondary | ICD-10-CM

## 2021-04-25 DIAGNOSIS — R269 Unspecified abnormalities of gait and mobility: Secondary | ICD-10-CM

## 2021-04-25 NOTE — Therapy (Signed)
[place] MAIN [facility] SERVICES [address] [place], [place], [address] Phone: [phone]   Fax:  [phone]  Physical Therapy Treatment  Patient Details  Name: Christine Chavez MRN: [id number] Date of Birth: [birth year] Referring Provider (PT): Elza Rafter   Encounter Date: 04/25/2021   PT End of Session - 04/25/21 1114     Visit Number 143    Number of Visits 161    Date for PT Re-Evaluation 05/30/21    Progress Note Due on Visit 130    PT Start Time 1015    PT Stop Time 1100    PT Time Calculation (min) 45 min    Equipment Utilized During Treatment Gait belt    Activity Tolerance Patient tolerated treatment well;Patient limited by fatigue    Behavior During Therapy WFL for tasks assessed/performed             Past Medical History:  Diagnosis Date   Complication of anesthesia    ? seizures after anesthesia    Headache    Migraines    Neurogenic bladder    Renal disorder    Vision abnormalities     Past Surgical History:  Procedure Laterality Date   ANTERIOR CRUCIATE LIGAMENT REPAIR  1997   APPENDECTOMY     COLONOSCOPY WITH PROPOFOL N/A 11/11/2018   Procedure: COLONOSCOPY WITH PROPOFOL;  Surgeon: [name], MD;  Location: [facility] ENDOSCOPY;  Service: Endoscopy;  Laterality: N/A;   CYSTOSCOPY WITH STENT PLACEMENT Right 04/17/2016   Procedure: CYSTOSCOPY WITH STENT PLACEMENT;  Surgeon: [name], MD;  Location: [facility] ORS;  Service: Urology;  Laterality: Right;   ESOPHAGOGASTRODUODENOSCOPY (EGD) WITH PROPOFOL N/A 11/11/2018   Procedure: ESOPHAGOGASTRODUODENOSCOPY (EGD) WITH PROPOFOL;  Surgeon: [name], MD;  Location: [facility] ENDOSCOPY;  Service: Endoscopy;  Laterality: N/A;   EXPLORATORY LAPAROTOMY  1999   KIDNEY STONE SURGERY  04/2016   REVISION UROSTOMY CUTANEOUS     REVISION UROSTOMY CUTANEOUS  01/10/2018   SUPRAPUBIC CATHETER PLACEMENT  08/2017   TONSILLECTOMY      There were no vitals  filed for this visit.   Subjective Assessment - 04/25/21 1112     Subjective Patient has an additional physician appointment today. Has not seen an improvement despite having a B12 shot last week.    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 Due to the rarity of her disorder it took years prior to diagnosis. 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  Patient has good days and bad days. Her good days allow for short ambulation (within room only not between rooms) and her bad days she is unable to ambulate.    Limitations Lifting;Standing;Walking;House hold activities;Other (comment)    How long can you stand comfortably? limited on bad days    How long can you walk comfortably? within room only on good days, unable on bad days    Patient Stated Goals to regain PLOF    Currently in Pain? Yes    Pain Score 6     Pain Location --   whole body   Pain Orientation --   whole body   Pain Descriptors / Indicators Aching    Pain Type Chronic pain    Pain Onset 1 to  4 weeks ago                   Transition to/from transport chair with min A due to bilateral knee buckling     Supine:  Hamstring lengthening with leg on PT shoulder 60 seconds each LE;  Popliteal angle 60 seconds each LE Single knee to chest 60 seconds each LE Knee to cross body shoulder 60 seconds each LE LE rotation for low back relief x 60 seconds x 4 trials Cervical side bend with overpressure at glenohumeral joint 2x 60 seconds each direction Cervical rotation with overpressure at glenohumeral joint 2x60 seconds each LE Suboccipital release 30 seconds x 4 trials  RTB abduction 15x  RTB marches 15x supine RTB  overhead raises 15x RTB ER 15x    Sidelying: -STM with implementation of effleurage and pettrisage to lumbar and thoracic paraspinals and quadratus lumborum. X 7 minutes each side.    Pt educated throughout session about proper posture and technique with exercises. Improved exercise technique, movement at target joints, use of target muscles after min to mod verbal, visual, tactile cues.  Patient is highly motivated despite high pain, fatigue, and tone this session. Strengthening tolerated well with patient tired by end of session. Patient tone reduced by manual and therex. Patient encouraged to follow up with neurologist. Patient will continue to benefit from skilled PT to address gait deficits, standing and mobility tolerance, and strength to improve quality of life and level of independence with functional mobility and ADLs.                   PT Education - 04/25/21 1113     Education Details exercise technique, body mechanics, pain reduction    Person(s) Educated Patient    Methods Explanation;Demonstration;Tactile cues;Verbal cues    Comprehension Verbalized understanding;Returned demonstration;Verbal cues required;Tactile cues required              PT Short Term Goals - 12/09/20 1201       PT SHORT TERM GOAL #1   Title Patient will demonstrate coordinated diaphragmatic breathing with pelvic tilts to demonstrate improved control of diaphragm and TA, to allow for further strengthening of core musculature and decreased pelvic floor spasm.    Baseline Pt. demonstrates breathing dysfunction and poor PFM coordination evidenced by anal manometry As of 2/22: Pt. continues to have restriction in her diaphragm and near T/L junction but is able to intentionally use diaphragmatic breathing through the available ROM.    Time 5    Period Weeks    Status Achieved    Target Date 04/20/19      PT SHORT TERM GOAL #2   Title Patient will report a reduction in pain to no greater  than 6/10 over the prior week to demonstrate symptom improvement.    Baseline Pain is 10/10 at worst, 2/10 at best As of 2/22: Pt. had achieved this goal for 1 week as of 1/21 but may have infection or other insult that caused pain to increase again. As of 4/29: Pain high of 8/10 but has been over-doing her activity over the past week. As of 6/10: Pt. was able to keep pain at 6 or below since prior visit by using TENS to help keep tension lower but has not had a full week to test if it will keep working. As of 7/6: Pt. had met this goal but has a new UTI and it caused pain to spike to 8/10 over the weekend. 8/25:  6/10 due to back pain    Time 5    Period Weeks    Status Achieved    Target Date 01/13/21      PT SHORT TERM GOAL #3   Title Patient will demonstrate HEP x1 in the clinic to demonstrate understanding and proper form to allow for further improvement.    Baseline Pt. lacks knowledge of therepeutic exercises that can decrease her pain/Sx.    Time 5    Period Weeks    Status Achieved    Target Date 04/20/19      PT SHORT TERM GOAL #4   Title Patient will report consistent use of foot-stool (squatty-potty) for positioning with BM to decrease pain with BM and intra-abdominal pressure.    Baseline Pt. having constipation due to PFM dysfunction    Time 5    Period Weeks    Status Achieved    Target Date 04/20/19               PT Long Term Goals - 03/31/21 0001       PT LONG TERM GOAL #1   Title Patient will be independent with manual wheelchair mobility in public location such as store for increased independence.    Baseline 11/21: unable to push herself around entire store yet 12/15: Patient reports being able to push herself around store independently but requires multiple rest breaks and assistance with managing shopping basket/items.    Time 12    Period Weeks    Status Partially Met    Target Date 05/30/21      PT LONG TERM GOAL #2   Title Patient will increase 10  meter walk test to <30 seconds as to improve gait speed for better community ambulation and to reduce fall risk.    Baseline 6/2: 1 min 18 seconds with rollator 8/25: deferred due to excessive knee buckling during TUG 10/17: 1 min 38 seconds with frequent knee buckling 11/21: 1 min 11 seconds with knee buckling with rollator 12/15: deferred secondary to pain    Time 12    Period Weeks    Status On-going    Target Date 05/30/21      PT LONG TERM GOAL #3   Title Patient (< 33 years old) will complete five times sit to stand test in < 20 seconds indicating an increased LE strength and improved balance.    Baseline 6/2: 43 seconds with heavy BUE support 8/25: 40 seconds with SUE support 10/17: 21 seconds with SUE support 11/21: 20 seconds with SUE support 12/15: deferred secondary to pain    Time 12    Period Weeks    Status Partially Met    Target Date 05/30/21      PT LONG TERM GOAL #4   Title Pt. will be able to perform 1 hr. of light housework reqiring intermittent standing and walking without increased pain/fatigue >2 pts.    Baseline Pt. becomes fatigued washing fruits/vegetbles in small stages and has fatigue>3 pts. 3/14: able to perform but needs rest breaks 4/25: able to do but is fatiguing 6/2: able to perform on good days; unable to perform on bad days 8/25: 4-5/10 10/17: 40% of the time 12/15: not addressed this session    Time 12    Period Weeks    Status Partially Met      PT LONG TERM GOAL #5   Title Patient will reduce timed up and go to <30 seconds to reduce fall risk and demonstrate  improved transfer/gait ability.    Baseline 8/25: 1 min 47 seconds with rollator 10/17: 1 min 2 seconds 11/21: 59 seconds 12/15: deferred secondary to pain    Time 12    Period Weeks    Status Partially Met    Target Date 05/30/21      PT LONG TERM GOAL #7   Title Patient will attend two consecutive athletic events for her children indicating improved capacity for activities and quality of  life.    Baseline 8/25: can only attend one at a time 10/17: 1.5 seconds 11/21: able to attend in power chair not yet in manual chair 12/15: patient has not been able to attend multiple events in manual chair yet due to fatigue and discomfort    Time 12    Period Weeks    Status Partially Met    Target Date 05/30/21                   Plan - 04/25/21 1120     Clinical Impression Statement Patient is highly motivated despite high pain, fatigue, and tone this session. Strengthening tolerated well with patient tired by end of session. Patient tone reduced by manual and therex. Patient encouraged to follow up with neurologist. Patient will continue to benefit from skilled PT to address gait deficits, standing and mobility tolerance, and strength to improve quality of life and level of independence with functional mobility and ADLs.    Personal Factors and Comorbidities Comorbidity 3+;Transportation;Time since onset of injury/illness/exacerbation;Finances    Comorbidities neurologic insult with pending diagnosis, multiple recurrent UTI's, BLE weakness, depression, hereditary spastic paraplegia    Examination-Activity Limitations Caring for Others;Carry;Continence;Squat;Stairs;Toileting;Locomotion Level;Lift;Stand;Bed Mobility;Bend;Reach Overhead;Sit;Dressing;Transfers    Examination-Participation Restrictions Church;Cleaning;Community Activity;Driving;Interpersonal Relationship;Laundry;[employer] Work;Meal Prep;School;Volunteer    Stability/Clinical Decision Making Unstable/Unpredictable    Rehab Potential Fair    PT Frequency 2x / week    PT Duration 12 weeks    PT Treatment/Interventions ADLs/Self Care Home Management;Aquatic Therapy;Biofeedback;Moist Heat;Electrical Stimulation;Cryotherapy;Traction;Ultrasound;Therapeutic activities;Functional mobility training;Stair training;Gait training;Therapeutic exercise;Balance training;Neuromuscular re-education;Patient/family education;Manual  techniques;Dry needling;Scar mobilization;Energy conservation;Taping;Joint Manipulations;Spinal Manipulations;Visual/perceptual remediation/compensation;Passive range of motion;Wheelchair mobility training;DME Instruction;Orthotic Fit/Training    PT Next Visit Plan transfers, stabilization in standing, core activation    PT Home Exercise Plan Added Quad sets;Access Code: [email or web address] URL: [email or web address]    Consulted and Agree with Plan of Care Patient             Patient will benefit from skilled therapeutic intervention in order to improve the following deficits and impairments:  Abnormal gait, Decreased balance, Decreased endurance, Decreased mobility, Difficulty walking, Increased muscle spasms, Impaired sensation, Improper body mechanics, Impaired tone, Decreased activity tolerance, Decreased coordination, Decreased strength, Postural dysfunction, Pain, Impaired flexibility  Visit Diagnosis: Other muscle spasm  Abnormality of gait and mobility  Muscle weakness (generalized)     Problem List Patient Active Problem List   Diagnosis Date Noted   Seizure ([place]) 11/11/2018   Major depressive disorder, recurrent episode, moderate ([place]) 02/07/2018   Nephrolithiasis 04/16/2016   Numbness 07/28/2015   Bladder retention 06/23/2015   Abdominal pain 06/04/2015   Dizziness 05/18/2015   Neck pain 29/92/4268   Complicated migraine 34/19/6222   Other fatigue 04/28/2015   Abnormal finding on MRI of brain 04/28/2015   D (diarrhea) 03/29/2015   H/O disease 03/29/2015   Abnormal weight loss 03/29/2015   Muscle weakness (generalized) 03/14/2015   Headache, migraine 03/10/2015   Janna Arch, PT, DPT  04/25/2021, 11:22 AM  [place]  SERVICES [place], [place], [address] Phone: [phone]   Fax:  [phone]  Name: Cicily Bonano MRN: [id number] Date of Birth: October 01, 1980

## 2021-04-28 ENCOUNTER — Ambulatory Visit: Payer: BC Managed Care – PPO

## 2021-05-02 ENCOUNTER — Ambulatory Visit: Payer: BC Managed Care – PPO

## 2021-05-05 ENCOUNTER — Ambulatory Visit: Payer: BC Managed Care – PPO

## 2021-05-05 ENCOUNTER — Other Ambulatory Visit: Payer: Self-pay

## 2021-05-05 DIAGNOSIS — R269 Unspecified abnormalities of gait and mobility: Secondary | ICD-10-CM

## 2021-05-05 DIAGNOSIS — M62838 Other muscle spasm: Secondary | ICD-10-CM

## 2021-05-05 DIAGNOSIS — M6281 Muscle weakness (generalized): Secondary | ICD-10-CM

## 2021-05-05 NOTE — Therapy (Signed)
[place] [facility] SERVICES [address] [place], [place], [address] Phone: [phone]   Fax:  [phone]  Physical Therapy Treatment  Patient Details  Name: Christine Chavez MRN: [id number] Date of Birth: [birth year] Referring Provider (PT): Elza Rafter   Encounter Date: 05/05/2021   PT End of Session - 05/05/21 1609     Visit Number 144    Number of Visits 161    Date for PT Re-Evaluation 05/30/21    Progress Note Due on Visit 130    PT Start Time 1015    PT Stop Time 1059    PT Time Calculation (min) 44 min    Equipment Utilized During Treatment Gait belt    Activity Tolerance Patient tolerated treatment well;Patient limited by fatigue    Behavior During Therapy WFL for tasks assessed/performed             Past Medical History:  Diagnosis Date   Complication of anesthesia    ? seizures after anesthesia    Headache    Migraines    Neurogenic bladder    Renal disorder    Vision abnormalities     Past Surgical History:  Procedure Laterality Date   ANTERIOR CRUCIATE LIGAMENT REPAIR  1997   APPENDECTOMY     COLONOSCOPY WITH PROPOFOL N/A 11/11/2018   Procedure: COLONOSCOPY WITH PROPOFOL;  Surgeon: [name], MD;  Location[facility] ENDOSCOPY;  Service: Endoscopy;  Laterality: N/A;   CYSTOSCOPY WITH STENT PLACEMENT Right 04/17/2016   Procedure: CYSTOSCOPY WITH STENT PLACEMENT;  Surgeon: [name], MD;  Location: [facility] ORS;  Service: Urology;  Laterality: Right;   ESOPHAGOGASTRODUODENOSCOPY (EGD) WITH PROPOFOL N/A 11/11/2018   Procedure: ESOPHAGOGASTRODUODENOSCOPY (EGD) WITH PROPOFOL;  Surgeon: [name], MD;  Location: [facility] ENDOSCOPY;  Service: Endoscopy;  Laterality: N/A;   EXPLORATORY LAPAROTOMY  1999   KIDNEY STONE SURGERY  04/2016   REVISION UROSTOMY CUTANEOUS     REVISION UROSTOMY CUTANEOUS  01/10/2018   SUPRAPUBIC CATHETER PLACEMENT  08/2017   TONSILLECTOMY      There were no vitals  filed for this visit.   Subjective Assessment - 05/05/21 1608     Subjective Patient has had multiple doctor appointments since last session. Is continuing to have symptoms.    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 Due to the rarity of her disorder it took years prior to diagnosis. 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  Patient has good days and bad days. Her good days allow for short ambulation (within room only not between rooms) and her bad days she is unable to ambulate.    Limitations Lifting;Standing;Walking;House hold activities;Other (comment)    How long can you stand comfortably? limited on bad days    How long can you walk comfortably? within room only on good days, unable on bad days    Patient Stated Goals to regain PLOF    Currently in Pain? Yes    Pain Score 6     Pain Location Back    Pain Orientation Mid;Lower    Pain Descriptors / Indicators Aching    Pain Type Chronic pain    Pain Onset 1 to 4 weeks ago  Transition to/from transport chair with min A due to bilateral knee buckling     Supine:  Hamstring lengthening with leg on PT shoulder 60 seconds each LE; x2 trials Popliteal angle 60 seconds each LE Single knee to chest 60 seconds each LE Knee to cross body shoulder 60 seconds each LE LE rotation for low back relief x 60 seconds x 4 trials RTB abduction 15x  PVC pipe chest press 15x PVC pipe overhead raises 15x posterior pelvic tilt 10x  Sidelying: Clamshell 15x each LE Reverse clamshell 15x each LE  Trigger Point Dry Needling (TDN), unbilled Education performed with patient regarding potential benefit of TDN.  Reviewed precautions and risks with patient. Reviewed special precautions/risks over lung fields which include pneumothorax. Reviewed signs and symptoms of pneumothorax and advised pt to go to ER immediately if these symptoms develop advise them of dry needling treatment. Extensive time spent with pt to ensure full understanding of TDN risks. Pt provided verbal consent to treatment. TDN performed to  with 0.3 x 60 single needle placements with local twitch response (LTR). Pistoning technique utilized. Improved pain-free motion following intervention. Musculature focused on: thoracic and lumbar paraspinals, gluteal musculature. X 12 minutes     Pt educated throughout session about proper posture and technique with exercises. Improved exercise technique, movement at target joints, use of target muscles after min to mod verbal, visual, tactile cues.  Patient introduced to dry needling due to back pain and limited relief. Patient does have improved flexibility by end of session. She is highly motivated despite her fatigue and symptoms. Encouragement for continuation of strengthening performed. Patient will continue to benefit from skilled PT to address gait deficits, standing and mobility tolerance, and strength to improve quality of life and level of independence with functional mobility and ADLs.                 PT Education - 05/05/21 1608     Education Details exercise technique, body mechanics, pain reduction, TDN    Person(s) Educated Patient    Methods Explanation;Demonstration;Tactile cues;Verbal cues    Comprehension Verbalized understanding;Returned demonstration;Verbal cues required;Tactile cues required              PT Short Term Goals - 12/09/20 1201       PT SHORT TERM GOAL #1   Title Patient will demonstrate coordinated diaphragmatic breathing with pelvic tilts to demonstrate improved control of diaphragm and TA, to allow for further strengthening of core musculature  and decreased pelvic floor spasm.    Baseline Pt. demonstrates breathing dysfunction and poor PFM coordination evidenced by anal manometry As of 2/22: Pt. continues to have restriction in her diaphragm and near T/L junction but is able to intentionally use diaphragmatic breathing through the available ROM.    Time 5    Period Weeks    Status Achieved    Target Date 04/20/19      PT SHORT TERM GOAL #2   Title Patient will report a reduction in pain to no greater than 6/10 over the prior week to demonstrate symptom improvement.    Baseline Pain is 10/10 at worst, 2/10 at best As of 2/22: Pt. had achieved this goal for 1 week as of 1/21 but may have infection or other insult that caused pain to increase again. As of 4/29: Pain high of 8/10 but has been over-doing her activity over the past week. As of 6/10: Pt. was able to keep pain at 6 or below since prior visit  by using TENS to help keep tension lower but has not had a full week to test if it will keep working. As of 7/6: Pt. had met this goal but has a new UTI and it caused pain to spike to 8/10 over the weekend. 8/25: 6/10 due to back pain    Time 5    Period Weeks    Status Achieved    Target Date 01/13/21      PT SHORT TERM GOAL #3   Title Patient will demonstrate HEP x1 in the clinic to demonstrate understanding and proper form to allow for further improvement.    Baseline Pt. lacks knowledge of therepeutic exercises that can decrease her pain/Sx.    Time 5    Period Weeks    Status Achieved    Target Date 04/20/19      PT SHORT TERM GOAL #4   Title Patient will report consistent use of foot-stool (squatty-potty) for positioning with BM to decrease pain with BM and intra-abdominal pressure.    Baseline Pt. having constipation due to PFM dysfunction    Time 5    Period Weeks    Status Achieved    Target Date 04/20/19               PT Long Term Goals - 03/31/21 0001       PT LONG TERM GOAL #1   Title Patient will be  independent with manual wheelchair mobility in public location such as store for increased independence.    Baseline 11/21: unable to push herself around entire store yet 12/15: Patient reports being able to push herself around store independently but requires multiple rest breaks and assistance with managing shopping basket/items.    Time 12    Period Weeks    Status Partially Met    Target Date 05/30/21      PT LONG TERM GOAL #2   Title Patient will increase 10 meter walk test to <30 seconds as to improve gait speed for better community ambulation and to reduce fall risk.    Baseline 6/2: 1 min 18 seconds with rollator 8/25: deferred due to excessive knee buckling during TUG 10/17: 1 min 38 seconds with frequent knee buckling 11/21: 1 min 11 seconds with knee buckling with rollator 12/15: deferred secondary to pain    Time 12    Period Weeks    Status On-going    Target Date 05/30/21      PT LONG TERM GOAL #3   Title Patient (< 61 years old) will complete five times sit to stand test in < 20 seconds indicating an increased LE strength and improved balance.    Baseline 6/2: 43 seconds with heavy BUE support 8/25: 40 seconds with SUE support 10/17: 21 seconds with SUE support 11/21: 20 seconds with SUE support 12/15: deferred secondary to pain    Time 12    Period Weeks    Status Partially Met    Target Date 05/30/21      PT LONG TERM GOAL #4   Title Pt. will be able to perform 1 hr. of light housework reqiring intermittent standing and walking without increased pain/fatigue >2 pts.    Baseline Pt. becomes fatigued washing fruits/vegetbles in small stages and has fatigue>3 pts. 3/14: able to perform but needs rest breaks 4/25: able to do but is fatiguing 6/2: able to perform on good days; unable to perform on bad days 8/25: 4-5/10 10/17: 40% of the time 12/15: not addressed  this session    Time 12    Period Weeks    Status Partially Met      PT LONG TERM GOAL #5   Title Patient will  reduce timed up and go to <30 seconds to reduce fall risk and demonstrate improved transfer/gait ability.    Baseline 8/25: 1 min 47 seconds with rollator 10/17: 1 min 2 seconds 11/21: 59 seconds 12/15: deferred secondary to pain    Time 12    Period Weeks    Status Partially Met    Target Date 05/30/21      PT LONG TERM GOAL #7   Title Patient will attend two consecutive athletic events for her children indicating improved capacity for activities and quality of life.    Baseline 8/25: can only attend one at a time 10/17: 1.5 seconds 11/21: able to attend in power chair not yet in manual chair 12/15: patient has not been able to attend multiple events in manual chair yet due to fatigue and discomfort    Time 12    Period Weeks    Status Partially Met    Target Date 05/30/21                   Plan - 05/05/21 1625     Clinical Impression Statement Patient introduced to dry needling due to back pain and limited relief. Patient does have improved flexibility by end of session. She is highly motivated despite her fatigue and symptoms. Encouragement for continuation of strengthening performed. Patient will continue to benefit from skilled PT to address gait deficits, standing and mobility tolerance, and strength to improve quality of life and level of independence with functional mobility and ADLs.    Personal Factors and Comorbidities Comorbidity 3+;Transportation;Time since onset of injury/illness/exacerbation;Finances    Comorbidities neurologic insult with pending diagnosis, multiple recurrent UTI's, BLE weakness, depression, hereditary spastic paraplegia    Examination-Activity Limitations Caring for Others;Carry;Continence;Squat;Stairs;Toileting;Locomotion Level;Lift;Stand;Bed Mobility;Bend;Reach Overhead;Sit;Dressing;Transfers    Examination-Participation Restrictions Church;Cleaning;Community Activity;Driving;Interpersonal Relationship;Laundry;[employer] Work;Meal Prep;School;Volunteer     Stability/Clinical Decision Making Unstable/Unpredictable    Rehab Potential Fair    PT Frequency 2x / week    PT Duration 12 weeks    PT Treatment/Interventions ADLs/Self Care Home Management;Aquatic Therapy;Biofeedback;Moist Heat;Electrical Stimulation;Cryotherapy;Traction;Ultrasound;Therapeutic activities;Functional mobility training;Stair training;Gait training;Therapeutic exercise;Balance training;Neuromuscular re-education;Patient/family education;Manual techniques;Dry needling;Scar mobilization;Energy conservation;Taping;Joint Manipulations;Spinal Manipulations;Visual/perceptual remediation/compensation;Passive range of motion;Wheelchair mobility training;DME Instruction;Orthotic Fit/Training    PT Next Visit Plan transfers, stabilization in standing, core activation    PT Home Exercise Plan Added Quad sets;Access Code: [email or web address] URL: [email or web address]    Consulted and Agree with Plan of Care Patient             Patient will benefit from skilled therapeutic intervention in order to improve the following deficits and impairments:  Abnormal gait, Decreased balance, Decreased endurance, Decreased mobility, Difficulty walking, Increased muscle spasms, Impaired sensation, Improper body mechanics, Impaired tone, Decreased activity tolerance, Decreased coordination, Decreased strength, Postural dysfunction, Pain, Impaired flexibility  Visit Diagnosis: Other muscle spasm  Abnormality of gait and mobility  Muscle weakness (generalized)     Problem List Patient Active Problem List   Diagnosis Date Noted   Seizure ([place]) 11/11/2018   Major depressive disorder, recurrent episode, moderate ([place]) 02/07/2018   Nephrolithiasis 04/16/2016   Numbness 07/28/2015   Bladder retention 06/23/2015   Abdominal pain 06/04/2015   Dizziness 05/18/2015   Neck pain 59/16/3846   Complicated migraine 65/99/3570   Other fatigue 04/28/2015   Abnormal finding on MRI of brain  04/28/2015    D (diarrhea) 03/29/2015   H/O disease 03/29/2015   Abnormal weight loss 03/29/2015   Muscle weakness (generalized) 03/14/2015   Headache, migraine 03/10/2015   Janna Arch, PT, DPT  05/05/2021, 4:26 PM  [place] [facility] SERVICES [address] [place], [place], [address] Phone: [phone]   Fax:  [phone]  Name: Junior Kenedy MRN: [id number] Date of Birth: 1981/03/20

## 2021-05-09 ENCOUNTER — Ambulatory Visit: Payer: BC Managed Care – PPO

## 2021-05-12 ENCOUNTER — Other Ambulatory Visit: Payer: Self-pay

## 2021-05-12 ENCOUNTER — Ambulatory Visit: Payer: BC Managed Care – PPO

## 2021-05-12 DIAGNOSIS — M62838 Other muscle spasm: Secondary | ICD-10-CM | POA: Diagnosis not present

## 2021-05-12 DIAGNOSIS — R269 Unspecified abnormalities of gait and mobility: Secondary | ICD-10-CM

## 2021-05-12 DIAGNOSIS — M6281 Muscle weakness (generalized): Secondary | ICD-10-CM

## 2021-05-12 NOTE — Therapy (Signed)
[place] [facility] MAIN [place] SERVICES [address] [place], [place], [address] Phone: [phone]   Fax:  [phone]  Physical Therapy Treatment  Patient Details  Name: Christine Chavez MRN: [id number] Date of Birth: [birth year] Referring Provider (PT): Ermalinda Memos   Encounter Date: 05/12/2021   PT End of Session - 05/12/21 1059     Visit Number 145    Number of Visits 161    Date for PT Re-Evaluation 05/30/21    Progress Note Due on Visit 130    PT Start Time 1100    PT Stop Time 1146    PT Time Calculation (min) 46 min    Equipment Utilized During Treatment Gait belt    Activity Tolerance Patient tolerated treatment well;Patient limited by fatigue    Behavior During Therapy WFL for tasks assessed/performed             Past Medical History:  Diagnosis Date   Complication of anesthesia    ? seizures after anesthesia    Headache    Migraines    Neurogenic bladder    Renal disorder    Vision abnormalities     Past Surgical History:  Procedure Laterality Date   ANTERIOR CRUCIATE LIGAMENT REPAIR  1997   APPENDECTOMY     COLONOSCOPY WITH PROPOFOL N/A 11/11/2018   Procedure: COLONOSCOPY WITH PROPOFOL;  Surgeon: Christena Deem, MD;  Location: [facility] ENDOSCOPY;  Service: Endoscopy;  Laterality: N/A;   CYSTOSCOPY WITH STENT PLACEMENT Right 04/17/2016   Procedure: CYSTOSCOPY WITH STENT PLACEMENT;  Surgeon: Malen Gauze, MD;  Location: [facility] ORS;  Service: Urology;  Laterality: Right;   ESOPHAGOGASTRODUODENOSCOPY (EGD) WITH PROPOFOL N/A 11/11/2018   Procedure: ESOPHAGOGASTRODUODENOSCOPY (EGD) WITH PROPOFOL;  Surgeon: Christena Deem, MD;  Location: [facility] ENDOSCOPY;  Service: Endoscopy;  Laterality: N/A;   EXPLORATORY LAPAROTOMY  1999   KIDNEY STONE SURGERY  04/2016   REVISION UROSTOMY CUTANEOUS     REVISION UROSTOMY CUTANEOUS  01/10/2018   SUPRAPUBIC CATHETER PLACEMENT  08/2017   TONSILLECTOMY      There were no vitals  filed for this visit.   Subjective Assessment - 05/12/21 1212     Subjective Patient reports she had her tibial nerve ablation earlier this week, missed her last session due to issue with transportation.    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 Due to the rarity of her disorder it took years prior to diagnosis. 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  Patient has good days and bad days. Her good days allow for short ambulation (within room only not between rooms) and her bad days she is unable to ambulate.    Limitations Lifting;Standing;Walking;House hold activities;Other (comment)    How long can you stand comfortably? limited on bad days    How long can you walk comfortably? within room only on good days, unable on bad days    Patient Stated Goals to regain PLOF    Currently in Pain? Yes    Pain Score 6     Pain Location Back    Pain Orientation Lower;Mid    Pain Descriptors / Indicators Aching    Pain Type Chronic pain    Pain Onset 1 to 4 weeks ago  Transition to/from transport chair with min A due to bilateral knee buckling     Supine:  Hamstring lengthening with leg on PT shoulder 60 seconds each LE; x2 trials Popliteal angle 60 seconds each LE Single knee to chest 60 seconds each LE Knee to cross body shoulder 60 seconds each LE LE rotation for low back relief x 60 seconds x 4 trials GTB abduction 15x  GTB marches 15x each LE posterior pelvic tilt 10x Bridge 10x   Sidelying: Clamshell 15x each LE Reverse clamshell 15x each LE  Seated: GTB hamstring curl 15x each LE GTB adduction 15x each LE GTB around bilateral  ankles: -alternating ER/IR 10x -alternating LAQ toe taps 10x each LE    Pt educated throughout session about proper posture and technique with exercises. Improved exercise technique,     Patient tolerated progressive strengthening interventions well despite full body tone and spasms. She remains highly motivated throughout her session. Encouragement for continuation of strengthening interventions performed with patient demonstrating understanding. Patient will continue to benefit from skilled PT to address gait deficits, standing and mobility tolerance, and strength to improve quality of life and level of independence with functional mobility and ADLs.                PT Education - 05/12/21 1059     Education Details exercise technique, body mechanics    Person(s) Educated Patient    Methods Explanation;Demonstration;Tactile cues;Verbal cues    Comprehension Verbalized understanding;Returned demonstration;Verbal cues required;Tactile cues required              PT Short Term Goals - 12/09/20 1201       PT SHORT TERM GOAL #1   Title Patient will demonstrate coordinated diaphragmatic breathing with pelvic tilts to demonstrate improved control of diaphragm and TA, to allow for further strengthening of core musculature and decreased pelvic floor spasm.    Baseline Pt. demonstrates breathing dysfunction and poor PFM coordination evidenced by anal manometry As of 2/22: Pt. continues to have restriction in her diaphragm and near T/L junction but is able to intentionally use diaphragmatic breathing through the available ROM.    Time 5    Period Weeks    Status Achieved    Target Date 04/20/19      PT SHORT TERM GOAL #2   Title Patient will report a reduction in pain to no greater than 6/10 over the prior week to demonstrate symptom improvement.    Baseline Pain is 10/10 at worst, 2/10 at best As of 2/22: Pt. had achieved this goal for 1 week as of 1/21 but may have infection  or other insult that caused pain to increase again. As of 4/29: Pain high of 8/10 but has been over-doing her activity over the past week. As of 6/10: Pt. was able to keep pain at 6 or below since prior visit by using TENS to help keep tension lower but has not had a full week to test if it will keep working. As of 7/6: Pt. had met this goal but has a new UTI and it caused pain to spike to 8/10 over the weekend. 8/25: 6/10 due to back pain    Time 5    Period Weeks    Status Achieved    Target Date 01/13/21      PT SHORT TERM GOAL #3   Title Patient will demonstrate HEP x1 in the clinic to demonstrate understanding and proper form to allow for further improvement.  Baseline Pt. lacks knowledge of therepeutic exercises that can decrease her pain/Sx.    Time 5    Period Weeks    Status Achieved    Target Date 04/20/19      PT SHORT TERM GOAL #4   Title Patient will report consistent use of foot-stool (squatty-potty) for positioning with BM to decrease pain with BM and intra-abdominal pressure.    Baseline Pt. having constipation due to PFM dysfunction    Time 5    Period Weeks    Status Achieved    Target Date 04/20/19               PT Long Term Goals - 03/31/21 0001       PT LONG TERM GOAL #1   Title Patient will be independent with manual wheelchair mobility in public location such as store for increased independence.    Baseline 11/21: unable to push herself around entire store yet 12/15: Patient reports being able to push herself around store independently but requires multiple rest breaks and assistance with managing shopping basket/items.    Time 12    Period Weeks    Status Partially Met    Target Date 05/30/21      PT LONG TERM GOAL #2   Title Patient will increase 10 meter walk test to <30 seconds as to improve gait speed for better community ambulation and to reduce fall risk.    Baseline 6/2: 1 min 18 seconds with rollator 8/25: deferred due to excessive knee  buckling during TUG 10/17: 1 min 38 seconds with frequent knee buckling 11/21: 1 min 11 seconds with knee buckling with rollator 12/15: deferred secondary to pain    Time 12    Period Weeks    Status On-going    Target Date 05/30/21      PT LONG TERM GOAL #3   Title Patient (< 33 years old) will complete five times sit to stand test in < 20 seconds indicating an increased LE strength and improved balance.    Baseline 6/2: 43 seconds with heavy BUE support 8/25: 40 seconds with SUE support 10/17: 21 seconds with SUE support 11/21: 20 seconds with SUE support 12/15: deferred secondary to pain    Time 12    Period Weeks    Status Partially Met    Target Date 05/30/21      PT LONG TERM GOAL #4   Title Pt. will be able to perform 1 hr. of light housework reqiring intermittent standing and walking without increased pain/fatigue >2 pts.    Baseline Pt. becomes fatigued washing fruits/vegetbles in small stages and has fatigue>3 pts. 3/14: able to perform but needs rest breaks 4/25: able to do but is fatiguing 6/2: able to perform on good days; unable to perform on bad days 8/25: 4-5/10 10/17: 40% of the time 12/15: not addressed this session    Time 12    Period Weeks    Status Partially Met      PT LONG TERM GOAL #5   Title Patient will reduce timed up and go to <30 seconds to reduce fall risk and demonstrate improved transfer/gait ability.    Baseline 8/25: 1 min 47 seconds with rollator 10/17: 1 min 2 seconds 11/21: 59 seconds 12/15: deferred secondary to pain    Time 12    Period Weeks    Status Partially Met    Target Date 05/30/21      PT LONG TERM GOAL #7  Title Patient will attend two consecutive athletic events for her children indicating improved capacity for activities and quality of life.    Baseline 8/25: can only attend one at a time 10/17: 1.5 seconds 11/21: able to attend in power chair not yet in manual chair 12/15: patient has not been able to attend multiple events in  manual chair yet due to fatigue and discomfort    Time 12    Period Weeks    Status Partially Met    Target Date 05/30/21                   Plan - 05/12/21 1222     Clinical Impression Statement Patient tolerated progressive strengthening interventions well despite full body tone and spasms. She remains highly motivated throughout her session. Encouragement for continuation of strengthening interventions performed with patient demonstrating understanding. Patient will continue to benefit from skilled PT to address gait deficits, standing and mobility tolerance, and strength to improve quality of life and level of independence with functional mobility and ADLs.    Personal Factors and Comorbidities Comorbidity 3+;Transportation;Time since onset of injury/illness/exacerbation;Finances    Comorbidities neurologic insult with pending diagnosis, multiple recurrent UTI's, BLE weakness, depression, hereditary spastic paraplegia    Examination-Activity Limitations Caring for Others;Carry;Continence;Squat;Stairs;Toileting;Locomotion Level;Lift;Stand;Bed Mobility;Bend;Reach Overhead;Sit;Dressing;Transfers    Examination-Participation Restrictions Church;Cleaning;Community Activity;Driving;Interpersonal Relationship;Laundry;[employer] Work;Meal Prep;School;Volunteer    Stability/Clinical Decision Making Unstable/Unpredictable    Rehab Potential Fair    PT Frequency 2x / week    PT Duration 12 weeks    PT Treatment/Interventions ADLs/Self Care Home Management;Aquatic Therapy;Biofeedback;Moist Heat;Electrical Stimulation;Cryotherapy;Traction;Ultrasound;Therapeutic activities;Functional mobility training;Stair training;Gait training;Therapeutic exercise;Balance training;Neuromuscular re-education;Patient/family education;Manual techniques;Dry needling;Scar mobilization;Energy conservation;Taping;Joint Manipulations;Spinal Manipulations;Visual/perceptual remediation/compensation;Passive range of  motion;Wheelchair mobility training;DME Instruction;Orthotic Fit/Training    PT Next Visit Plan transfers, stabilization in standing, core activation    PT Home Exercise Plan Added Quad sets;Access Code: [email or web address] URL: [email or web address]    Consulted and Agree with Plan of Care Patient             Patient will benefit from skilled therapeutic intervention in order to improve the following deficits and impairments:  Abnormal gait, Decreased balance, Decreased endurance, Decreased mobility, Difficulty walking, Increased muscle spasms, Impaired sensation, Improper body mechanics, Impaired tone, Decreased activity tolerance, Decreased coordination, Decreased strength, Postural dysfunction, Pain, Impaired flexibility  Visit Diagnosis: Other muscle spasm  Abnormality of gait and mobility  Muscle weakness (generalized)     Problem List Patient Active Problem List   Diagnosis Date Noted   Seizure ([place]) 11/11/2018   Major depressive disorder, recurrent episode, moderate ([place]) 02/07/2018   Nephrolithiasis 04/16/2016   Numbness 07/28/2015   Bladder retention 06/23/2015   Abdominal pain 06/04/2015   Dizziness 05/18/2015   Neck pain 94/70/9628   Complicated migraine 36/62/9476   Other fatigue 04/28/2015   Abnormal finding on MRI of brain 04/28/2015   D (diarrhea) 03/29/2015   H/O disease 03/29/2015   Abnormal weight loss 03/29/2015   Muscle weakness (generalized) 03/14/2015   Headache, migraine 03/10/2015    Janna Arch, PT, DPT  05/12/2021, 12:23 PM  [place] MAIN [facility] SERVICES [address] [place], [place], [address] Phone: [phone]   Fax:  [phone]  Name: Alitzel Cookson MRN: [id number] Date of Birth: 20-Jul-1980

## 2021-05-16 ENCOUNTER — Ambulatory Visit: Payer: BC Managed Care – PPO

## 2021-05-16 ENCOUNTER — Other Ambulatory Visit: Payer: Self-pay

## 2021-05-16 DIAGNOSIS — R269 Unspecified abnormalities of gait and mobility: Secondary | ICD-10-CM

## 2021-05-16 DIAGNOSIS — M62838 Other muscle spasm: Secondary | ICD-10-CM

## 2021-05-16 DIAGNOSIS — M6281 Muscle weakness (generalized): Secondary | ICD-10-CM

## 2021-05-16 NOTE — Therapy (Signed)
[place] MAIN [facility] SERVICES [address] [place], [place], [address] Phone: [phone]   Fax:  [phone]  Physical Therapy Treatment  Patient Details  Name: Christine Chavez MRN: [id number] Date of Birth: [birth year] Referring Provider (PT): Elza Rafter   Encounter Date: 05/16/2021   PT End of Session - 05/16/21 1006     Visit Number 146    Number of Visits 161    Date for PT Re-Evaluation 05/30/21    Progress Note Due on Visit 130    PT Start Time 1014    PT Stop Time 1059    PT Time Calculation (min) 45 min    Equipment Utilized During Treatment Gait belt    Activity Tolerance Patient tolerated treatment well;Patient limited by fatigue    Behavior During Therapy WFL for tasks assessed/performed             Past Medical History:  Diagnosis Date   Complication of anesthesia    ? seizures after anesthesia    Headache    Migraines    Neurogenic bladder    Renal disorder    Vision abnormalities     Past Surgical History:  Procedure Laterality Date   ANTERIOR CRUCIATE LIGAMENT REPAIR  1997   APPENDECTOMY     COLONOSCOPY WITH PROPOFOL N/A 11/11/2018   Procedure: COLONOSCOPY WITH PROPOFOL;  Surgeon: [name], MD;  Location: [facility] ENDOSCOPY;  Service: Endoscopy;  Laterality: N/A;   CYSTOSCOPY WITH STENT PLACEMENT Right 04/17/2016   Procedure: CYSTOSCOPY WITH STENT PLACEMENT;  Surgeon: [name], MD;  Location: [facility] ORS;  Service: Urology;  Laterality: Right;   ESOPHAGOGASTRODUODENOSCOPY (EGD) WITH PROPOFOL N/A 11/11/2018   Procedure: ESOPHAGOGASTRODUODENOSCOPY (EGD) WITH PROPOFOL;  Surgeon: [name], MD;  Location: [facility] ENDOSCOPY;  Service: Endoscopy;  Laterality: N/A;   EXPLORATORY LAPAROTOMY  1999   KIDNEY STONE SURGERY  04/2016   REVISION UROSTOMY CUTANEOUS     REVISION UROSTOMY CUTANEOUS  01/10/2018   SUPRAPUBIC CATHETER PLACEMENT  08/2017   TONSILLECTOMY      There were no vitals  filed for this visit.   Subjective Assessment - 05/16/21 1218     Subjective Patient reports that she has heard back that she does have another infection however this one is different than the previous one. 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 Due to the rarity of her disorder it took years prior to diagnosis. 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  Patient has good days and bad days. Her good days allow for short ambulation (within room only not between rooms) and her bad days she is unable to ambulate.    Limitations Lifting;Standing;Walking;House hold activities;Other (comment)    How long can you stand comfortably? limited on bad days    How long can you walk comfortably? within room only on good days, unable on bad days    Patient Stated Goals to regain PLOF    Currently in Pain? Yes    Pain Score 8     Pain Location Back    Pain Orientation Lower;Mid    Pain Descriptors / Indicators Aching;Spasm    Pain Type Neuropathic pain;Acute pain    Pain  Onset 1 to 4 weeks ago    Pain Frequency Constant                  Transition to/from transport chair with min A due to bilateral knee buckling     Supine:  Hamstring lengthening with leg on PT shoulder 60 seconds each LE; x2 trials Popliteal angle 60 seconds each LE x2 trials each LE Single knee to chest 60 seconds each LE Knee to cross body shoulder 60 seconds each LE LE rotation for low back relief x 60 seconds x 4 trials GTB abduction 20x  GTB marches 20x each LE posterior pelvic tilt 10x   Prone:  Hip flexor lengthening stretch 30 seconds x 3 trials each LE  Grade II  mobilizations 10seconds x 3 trials each T8-L5 UPA and CPA  Seated: GTB hamstring curl 15x each LE GTB adduction 15x each LE     Trigger Point Dry Needling (TDN), unbilled Education performed with patient regarding potential benefit of TDN. Reviewed precautions and risks with patient. Reviewed special precautions/risks over lung fields which include pneumothorax. Reviewed signs and symptoms of pneumothorax and advised pt to go to ER immediately if these symptoms develop advise them of dry needling treatment. Extensive time spent with pt to ensure full understanding of TDN risks. Pt provided verbal consent to treatment. TDN performed to  with 0.25 x 40 single needle placements with local twitch response (LTR). Pistoning technique utilized. Improved pain-free motion following intervention. X4 minutes to lumbar paraspinals   Pt educated throughout session about proper posture and technique with exercises. Improved exercise technique,    Patient presents with increased back pain, partially from kidney issues and partially from spasticity from UTI. Patient tolerates TDN, manual, and therex well today with decreased pain reported by end of session. Patient reports decreased tension and tone by end of session. Discussed potential need for return to pelvic floor therapy 1x week and neuro therapy 1x week. Patient will continue to benefit from skilled PT to address gait deficits, standing and mobility tolerance, and strength to improve quality of life and level of independence with functional mobility and ADLs.                   PT Education - 05/16/21 1006     Education Details exercise technique, body mechanics    Person(s) Educated Patient    Methods Demonstration;Explanation;Tactile cues;Verbal cues    Comprehension Verbalized understanding;Returned demonstration;Verbal cues required;Tactile cues required              PT Short Term Goals - 12/09/20 1201       PT SHORT TERM  GOAL #1   Title Patient will demonstrate coordinated diaphragmatic breathing with pelvic tilts to demonstrate improved control of diaphragm and TA, to allow for further strengthening of core musculature and decreased pelvic floor spasm.    Baseline Pt. demonstrates breathing dysfunction and poor PFM coordination evidenced by anal manometry As of 2/22: Pt. continues to have restriction in her diaphragm and near T/L junction but is able to intentionally use diaphragmatic breathing through the available ROM.    Time 5    Period Weeks    Status Achieved    Target Date 04/20/19      PT SHORT TERM GOAL #2   Title Patient will report a reduction in pain to no greater than 6/10 over the prior week to demonstrate symptom improvement.    Baseline Pain is 10/10 at worst, 2/10 at best As  of 2/22: Pt. had achieved this goal for 1 week as of 1/21 but may have infection or other insult that caused pain to increase again. As of 4/29: Pain high of 8/10 but has been over-doing her activity over the past week. As of 6/10: Pt. was able to keep pain at 6 or below since prior visit by using TENS to help keep tension lower but has not had a full week to test if it will keep working. As of 7/6: Pt. had met this goal but has a new UTI and it caused pain to spike to 8/10 over the weekend. 8/25: 6/10 due to back pain    Time 5    Period Weeks    Status Achieved    Target Date 01/13/21      PT SHORT TERM GOAL #3   Title Patient will demonstrate HEP x1 in the clinic to demonstrate understanding and proper form to allow for further improvement.    Baseline Pt. lacks knowledge of therepeutic exercises that can decrease her pain/Sx.    Time 5    Period Weeks    Status Achieved    Target Date 04/20/19      PT SHORT TERM GOAL #4   Title Patient will report consistent use of foot-stool (squatty-potty) for positioning with BM to decrease pain with BM and intra-abdominal pressure.    Baseline Pt. having constipation due to PFM  dysfunction    Time 5    Period Weeks    Status Achieved    Target Date 04/20/19               PT Long Term Goals - 03/31/21 0001       PT LONG TERM GOAL #1   Title Patient will be independent with manual wheelchair mobility in public location such as store for increased independence.    Baseline 11/21: unable to push herself around entire store yet 12/15: Patient reports being able to push herself around store independently but requires multiple rest breaks and assistance with managing shopping basket/items.    Time 12    Period Weeks    Status Partially Met    Target Date 05/30/21      PT LONG TERM GOAL #2   Title Patient will increase 10 meter walk test to <30 seconds as to improve gait speed for better community ambulation and to reduce fall risk.    Baseline 6/2: 1 min 18 seconds with rollator 8/25: deferred due to excessive knee buckling during TUG 10/17: 1 min 38 seconds with frequent knee buckling 11/21: 1 min 11 seconds with knee buckling with rollator 12/15: deferred secondary to pain    Time 12    Period Weeks    Status On-going    Target Date 05/30/21      PT LONG TERM GOAL #3   Title Patient (< 55 years old) will complete five times sit to stand test in < 20 seconds indicating an increased LE strength and improved balance.    Baseline 6/2: 43 seconds with heavy BUE support 8/25: 40 seconds with SUE support 10/17: 21 seconds with SUE support 11/21: 20 seconds with SUE support 12/15: deferred secondary to pain    Time 12    Period Weeks    Status Partially Met    Target Date 05/30/21      PT LONG TERM GOAL #4   Title Pt. will be able to perform 1 hr. of light housework reqiring intermittent standing and walking  without increased pain/fatigue >2 pts.    Baseline Pt. becomes fatigued washing fruits/vegetbles in small stages and has fatigue>3 pts. 3/14: able to perform but needs rest breaks 4/25: able to do but is fatiguing 6/2: able to perform on good days; unable  to perform on bad days 8/25: 4-5/10 10/17: 40% of the time 12/15: not addressed this session    Time 12    Period Weeks    Status Partially Met      PT LONG TERM GOAL #5   Title Patient will reduce timed up and go to <30 seconds to reduce fall risk and demonstrate improved transfer/gait ability.    Baseline 8/25: 1 min 47 seconds with rollator 10/17: 1 min 2 seconds 11/21: 59 seconds 12/15: deferred secondary to pain    Time 12    Period Weeks    Status Partially Met    Target Date 05/30/21      PT LONG TERM GOAL #7   Title Patient will attend two consecutive athletic events for her children indicating improved capacity for activities and quality of life.    Baseline 8/25: can only attend one at a time 10/17: 1.5 seconds 11/21: able to attend in power chair not yet in manual chair 12/15: patient has not been able to attend multiple events in manual chair yet due to fatigue and discomfort    Time 12    Period Weeks    Status Partially Met    Target Date 05/30/21                   Plan - 05/16/21 1220     Clinical Impression Statement Patient presents with increased back pain, partially from kidney issues and partially from spasticity from UTI. Patient tolerates TDN, manual, and therex well today with decreased pain reported by end of session. Patient reports decreased tension and tone by end of session. Discussed potential need for return to pelvic floor therapy 1x week and neuro therapy 1x week. Patient will continue to benefit from skilled PT to address gait deficits, standing and mobility tolerance, and strength to improve quality of life and level of independence with functional mobility and ADLs.    Personal Factors and Comorbidities Comorbidity 3+;Transportation;Time since onset of injury/illness/exacerbation;Finances    Comorbidities neurologic insult with pending diagnosis, multiple recurrent UTI's, BLE weakness, depression, hereditary spastic paraplegia     Examination-Activity Limitations Caring for Others;Carry;Continence;Squat;Stairs;Toileting;Locomotion Level;Lift;Stand;Bed Mobility;Bend;Reach Overhead;Sit;Dressing;Transfers    Examination-Participation Restrictions Church;Cleaning;Community Activity;Driving;Interpersonal Relationship;Laundry;[employer] Work;Meal Prep;School;Volunteer    Stability/Clinical Decision Making Unstable/Unpredictable    Rehab Potential Fair    PT Frequency 2x / week    PT Duration 12 weeks    PT Treatment/Interventions ADLs/Self Care Home Management;Aquatic Therapy;Biofeedback;Moist Heat;Electrical Stimulation;Cryotherapy;Traction;Ultrasound;Therapeutic activities;Functional mobility training;Stair training;Gait training;Therapeutic exercise;Balance training;Neuromuscular re-education;Patient/family education;Manual techniques;Dry needling;Scar mobilization;Energy conservation;Taping;Joint Manipulations;Spinal Manipulations;Visual/perceptual remediation/compensation;Passive range of motion;Wheelchair mobility training;DME Instruction;Orthotic Fit/Training    PT Next Visit Plan transfers, stabilization in standing, core activation    PT Home Exercise Plan Added Quad sets;Access Code: [email or web address] URL: [email or web address]    Consulted and Agree with Plan of Care Patient             Patient will benefit from skilled therapeutic intervention in order to improve the following deficits and impairments:  Abnormal gait, Decreased balance, Decreased endurance, Decreased mobility, Difficulty walking, Increased muscle spasms, Impaired sensation, Improper body mechanics, Impaired tone, Decreased activity tolerance, Decreased coordination, Decreased strength, Postural dysfunction, Pain, Impaired flexibility  Visit Diagnosis: Other muscle spasm  Abnormality of gait  and mobility  Muscle weakness (generalized)     Problem List Patient Active Problem List   Diagnosis Date Noted   Seizure ([place]) 11/11/2018   Major  depressive disorder, recurrent episode, moderate ([place]) 02/07/2018   Nephrolithiasis 04/16/2016   Numbness 07/28/2015   Bladder retention 06/23/2015   Abdominal pain 06/04/2015   Dizziness 05/18/2015   Neck pain 17/49/4496   Complicated migraine 75/91/6384   Other fatigue 04/28/2015   Abnormal finding on MRI of brain 04/28/2015   D (diarrhea) 03/29/2015   H/O disease 03/29/2015   Abnormal weight loss 03/29/2015   Muscle weakness (generalized) 03/14/2015   Headache, migraine 03/10/2015    Janna Arch, PT, DPT  05/16/2021, 12:21 PM  [place] MAIN [facility] SERVICES [address] [place], [place], [address] Phone: [phone]   Fax:  [phone]  Name: Eulalah Rupert MRN: [id number] Date of Birth: [birth year]

## 2021-05-18 ENCOUNTER — Ambulatory Visit: Payer: BC Managed Care – PPO | Admitting: Obstetrics and Gynecology

## 2021-05-19 ENCOUNTER — Ambulatory Visit: Payer: BC Managed Care – PPO | Attending: Gastroenterology | Admitting: Physical Therapy

## 2021-05-19 ENCOUNTER — Other Ambulatory Visit: Payer: Self-pay

## 2021-05-19 DIAGNOSIS — M62838 Other muscle spasm: Secondary | ICD-10-CM | POA: Insufficient documentation

## 2021-05-19 DIAGNOSIS — R2689 Other abnormalities of gait and mobility: Secondary | ICD-10-CM | POA: Diagnosis present

## 2021-05-19 DIAGNOSIS — M6281 Muscle weakness (generalized): Secondary | ICD-10-CM | POA: Diagnosis present

## 2021-05-19 DIAGNOSIS — R262 Difficulty in walking, not elsewhere classified: Secondary | ICD-10-CM | POA: Insufficient documentation

## 2021-05-19 DIAGNOSIS — R269 Unspecified abnormalities of gait and mobility: Secondary | ICD-10-CM | POA: Diagnosis present

## 2021-05-19 NOTE — Therapy (Signed)
[place] [facility] SERVICES [address] [place], [place], [address] Phone: [phone]   Fax:  [phone]  Physical Therapy Treatment  Patient Details  Name: Christine Chavez MRN: [id number] Date of Birth: [birth year] Referring Provider (PT): Elza Rafter   Encounter Date: 05/19/2021   PT End of Session - 05/19/21 1208     Visit Number 147    Number of Visits 161    Date for PT Re-Evaluation 05/30/21    Progress Note Due on Visit 150    PT Start Time 1505    PT Stop Time 1102    PT Time Calculation (min) 44 min    Activity Tolerance Patient tolerated treatment well;Patient limited by fatigue    Behavior During Therapy [facility] for tasks assessed/performed             Past Medical History:  Diagnosis Date   Complication of anesthesia    ? seizures after anesthesia    Headache    Migraines    Neurogenic bladder    Renal disorder    Vision abnormalities     Past Surgical History:  Procedure Laterality Date   ANTERIOR CRUCIATE LIGAMENT REPAIR  1997   APPENDECTOMY     COLONOSCOPY WITH PROPOFOL N/A 11/11/2018   Procedure: COLONOSCOPY WITH PROPOFOL;  Surgeon: [name], MD;  Location: [facility] ENDOSCOPY;  Service: Endoscopy;  Laterality: N/A;   CYSTOSCOPY WITH STENT PLACEMENT Right 04/17/2016   Procedure: CYSTOSCOPY WITH STENT PLACEMENT;  Surgeon: [name], MD;  Location: [facility] ORS;  Service: Urology;  Laterality: Right;   ESOPHAGOGASTRODUODENOSCOPY (EGD) WITH PROPOFOL N/A 11/11/2018   Procedure: ESOPHAGOGASTRODUODENOSCOPY (EGD) WITH PROPOFOL;  Surgeon: [name], MD;  Location[facility] ENDOSCOPY;  Service: Endoscopy;  Laterality: N/A;   EXPLORATORY LAPAROTOMY  1999   KIDNEY STONE SURGERY  04/2016   REVISION UROSTOMY CUTANEOUS     REVISION UROSTOMY CUTANEOUS  01/10/2018   SUPRAPUBIC CATHETER PLACEMENT  08/2017   TONSILLECTOMY      There were no vitals filed for this visit.   Subjective Assessment -  05/19/21 1132     Subjective Patient reports she is still experiencing increased back pain due to infection. She will see an orthopedic surgeon next week as well as the urologist.    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 Due to the rarity of her disorder it took years prior to diagnosis. 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  Patient has good days and bad days. Her good days allow for short ambulation (within room only not between rooms) and her bad days she is unable to ambulate.    Limitations Lifting;Standing;Walking;House hold activities;Other (comment)    How long can you stand comfortably? limited on bad days    How long can you walk comfortably? within room only on good days, unable on bad days    Patient Stated Goals to regain PLOF    Currently in Pain? Yes    Pain Score 8     Pain Location Back    Pain Orientation Lower;Mid    Pain Descriptors / Indicators Aching;Spasm    Pain Type Neuropathic pain;Acute pain    Pain Onset 1 to 4 weeks ago    Pain Frequency  Constant               Transition to/from transport chair with CGA Moist heat pack utilized for pain modulation of low back.    Supine:  Hamstring lengthening with leg on PT shoulder 60 seconds each LE; x2 trials Popliteal angle 60 seconds each LE x2 trials each LE Single knee to chest 60 seconds each LE Knee to cross body shoulder 60 seconds each LE LE rotation for low back relief x 60 seconds x 4 trials each  GTB abduction 20x  GTB marches 20x each LE posterior pelvic tilt 10x   Prone:  Hip flexor lengthening stretch 30 seconds x 3 trials each LE       Trigger Point Dry Needling (TDN),  unbilled Education performed with patient regarding potential benefit of TDN. Reviewed precautions and risks with patient. Reviewed special precautions/risks over lung fields which include pneumothorax. Reviewed signs and symptoms of pneumothorax and advised pt to go to ER immediately if these symptoms develop advise them of dry needling treatment. Extensive time spent with pt to ensure full understanding of TDN risks. Pt provided verbal consent to treatment. TDN performed to bilateral lumbar paraspinals with 0.30 x 60 single needle placements with local twitch response (LTR). Pistoning technique utilized. Improved pain-free motion following intervention. X4 minutes.   Pt educated throughout session about proper posture and technique with exercises. Improved exercise technique,      Patient presents with continued elevated back pain due to kidney issues and spasticity from UTI. Continued POC with manual, TDN to low back and therapeutic exercises. She tolerates well with some reported relief. Patient will continue to benefit from skilled PT to address gait deficits, standing and mobility tolerance, and strength to improve quality of life and level of independence with functional mobility and ADLs.           PT Short Term Goals - 12/09/20 1201       PT SHORT TERM GOAL #1   Title Patient will demonstrate coordinated diaphragmatic breathing with pelvic tilts to demonstrate improved control of diaphragm and TA, to allow for further strengthening of core musculature and decreased pelvic floor spasm.    Baseline Pt. demonstrates breathing dysfunction and poor PFM coordination evidenced by anal manometry As of 2/22: Pt. continues to have restriction in her diaphragm and near T/L junction but is able to intentionally use diaphragmatic breathing through the available ROM.    Time 5    Period Weeks    Status Achieved    Target Date 04/20/19      PT SHORT TERM GOAL #2   Title Patient will report a  reduction in pain to no greater than 6/10 over the prior week to demonstrate symptom improvement.    Baseline Pain is 10/10 at worst, 2/10 at best As of 2/22: Pt. had achieved this goal for 1 week as of 1/21 but may have infection or other insult that caused pain to increase again. As of 4/29: Pain high of 8/10 but has been over-doing her activity over the past week. As of 6/10: Pt. was able to keep pain at 6 or below since prior visit by using TENS to help keep tension lower but has not had a full week to test if it will keep working. As of 7/6: Pt. had met this goal but has a new UTI and it caused pain to spike to 8/10 over the weekend. 8/25: 6/10 due to back pain    Time 5  Period Weeks    Status Achieved    Target Date 01/13/21      PT SHORT TERM GOAL #3   Title Patient will demonstrate HEP x1 in the clinic to demonstrate understanding and proper form to allow for further improvement.    Baseline Pt. lacks knowledge of therepeutic exercises that can decrease her pain/Sx.    Time 5    Period Weeks    Status Achieved    Target Date 04/20/19      PT SHORT TERM GOAL #4   Title Patient will report consistent use of foot-stool (squatty-potty) for positioning with BM to decrease pain with BM and intra-abdominal pressure.    Baseline Pt. having constipation due to PFM dysfunction    Time 5    Period Weeks    Status Achieved    Target Date 04/20/19               PT Long Term Goals - 03/31/21 0001       PT LONG TERM GOAL #1   Title Patient will be independent with manual wheelchair mobility in public location such as store for increased independence.    Baseline 11/21: unable to push herself around entire store yet 12/15: Patient reports being able to push herself around store independently but requires multiple rest breaks and assistance with managing shopping basket/items.    Time 12    Period Weeks    Status Partially Met    Target Date 05/30/21      PT LONG TERM GOAL #2    Title Patient will increase 10 meter walk test to <30 seconds as to improve gait speed for better community ambulation and to reduce fall risk.    Baseline 6/2: 1 min 18 seconds with rollator 8/25: deferred due to excessive knee buckling during TUG 10/17: 1 min 38 seconds with frequent knee buckling 11/21: 1 min 11 seconds with knee buckling with rollator 12/15: deferred secondary to pain    Time 12    Period Weeks    Status On-going    Target Date 05/30/21      PT LONG TERM GOAL #3   Title Patient (< 78 years old) will complete five times sit to stand test in < 20 seconds indicating an increased LE strength and improved balance.    Baseline 6/2: 43 seconds with heavy BUE support 8/25: 40 seconds with SUE support 10/17: 21 seconds with SUE support 11/21: 20 seconds with SUE support 12/15: deferred secondary to pain    Time 12    Period Weeks    Status Partially Met    Target Date 05/30/21      PT LONG TERM GOAL #4   Title Pt. will be able to perform 1 hr. of light housework reqiring intermittent standing and walking without increased pain/fatigue >2 pts.    Baseline Pt. becomes fatigued washing fruits/vegetbles in small stages and has fatigue>3 pts. 3/14: able to perform but needs rest breaks 4/25: able to do but is fatiguing 6/2: able to perform on good days; unable to perform on bad days 8/25: 4-5/10 10/17: 40% of the time 12/15: not addressed this session    Time 12    Period Weeks    Status Partially Met      PT LONG TERM GOAL #5   Title Patient will reduce timed up and go to <30 seconds to reduce fall risk and demonstrate improved transfer/gait ability.    Baseline 8/25: 1 min 47 seconds with  rollator 10/17: 1 min 2 seconds 11/21: 59 seconds 12/15: deferred secondary to pain    Time 12    Period Weeks    Status Partially Met    Target Date 05/30/21      PT LONG TERM GOAL #7   Title Patient will attend two consecutive athletic events for her children indicating improved capacity  for activities and quality of life.    Baseline 8/25: can only attend one at a time 10/17: 1.5 seconds 11/21: able to attend in power chair not yet in manual chair 12/15: patient has not been able to attend multiple events in manual chair yet due to fatigue and discomfort    Time 12    Period Weeks    Status Partially Met    Target Date 05/30/21                   Plan - 05/19/21 1219     Clinical Impression Statement Patient presents with continued elevated back pain due to kidney issues and spasticity from UTI. Continued POC with manual, TDN to low back and therapeutic exercises. She tolerates well with some reported relief. Patient will continue to benefit from skilled PT to address gait deficits, standing and mobility tolerance, and strength to improve quality of life and level of independence with functional mobility and ADLs.    Personal Factors and Comorbidities Comorbidity 3+;Transportation;Time since onset of injury/illness/exacerbation;Finances    Comorbidities neurologic insult with pending diagnosis, multiple recurrent UTI's, BLE weakness, depression, hereditary spastic paraplegia    Examination-Activity Limitations Caring for Others;Carry;Continence;Squat;Stairs;Toileting;Locomotion Level;Lift;Stand;Bed Mobility;Bend;Reach Overhead;Sit;Dressing;Transfers    Examination-Participation Restrictions Church;Cleaning;Community Activity;Driving;Interpersonal Relationship;Laundry;[employer] Work;Meal Prep;School;Volunteer    Stability/Clinical Decision Making Unstable/Unpredictable    Rehab Potential Fair    PT Frequency 2x / week    PT Duration 12 weeks    PT Treatment/Interventions ADLs/Self Care Home Management;Aquatic Therapy;Biofeedback;Moist Heat;Electrical Stimulation;Cryotherapy;Traction;Ultrasound;Therapeutic activities;Functional mobility training;Stair training;Gait training;Therapeutic exercise;Balance training;Neuromuscular re-education;Patient/family education;Manual  techniques;Dry needling;Scar mobilization;Energy conservation;Taping;Joint Manipulations;Spinal Manipulations;Visual/perceptual remediation/compensation;Passive range of motion;Wheelchair mobility training;DME Instruction;Orthotic Fit/Training    PT Next Visit Plan transfers, stabilization in standing, core activation    PT Home Exercise Plan Added Quad sets;Access Code: [email or web address] URL: [email or web address]    Consulted and Agree with Plan of Care Patient             Patient will benefit from skilled therapeutic intervention in order to improve the following deficits and impairments:  Abnormal gait, Decreased balance, Decreased endurance, Decreased mobility, Difficulty walking, Increased muscle spasms, Impaired sensation, Improper body mechanics, Impaired tone, Decreased activity tolerance, Decreased coordination, Decreased strength, Postural dysfunction, Pain, Impaired flexibility  Visit Diagnosis: Other muscle spasm  Abnormality of gait and mobility  Muscle weakness (generalized)  Difficulty in walking, not elsewhere classified  Other abnormalities of gait and mobility     Problem List Patient Active Problem List   Diagnosis Date Noted   Seizure ([place]) 11/11/2018   Major depressive disorder, recurrent episode, moderate ([place]) 02/07/2018   Nephrolithiasis 04/16/2016   Numbness 07/28/2015   Bladder retention 06/23/2015   Abdominal pain 06/04/2015   Dizziness 05/18/2015   Neck pain 10/93/2355   Complicated migraine 73/22/0254   Other fatigue 04/28/2015   Abnormal finding on MRI of brain 04/28/2015   D (diarrhea) 03/29/2015   H/O disease 03/29/2015   Abnormal weight loss 03/29/2015   Muscle weakness (generalized) 03/14/2015   Headache, migraine 03/10/2015    Patrina Levering PT, DPT 05/19/21 12:21 PM [facility] [place], [place], [address] Phone: [phone]   Fax:   [phone]  Name: Louiza Moor MRN: [id number] Date of Birth: [birth year]

## 2021-05-23 ENCOUNTER — Ambulatory Visit: Payer: BC Managed Care – PPO

## 2021-05-23 ENCOUNTER — Encounter: Payer: BC Managed Care – PPO | Admitting: Speech Pathology

## 2021-05-26 ENCOUNTER — Ambulatory Visit: Payer: BC Managed Care – PPO

## 2021-05-30 ENCOUNTER — Other Ambulatory Visit: Payer: Self-pay

## 2021-05-30 ENCOUNTER — Ambulatory Visit: Payer: BC Managed Care – PPO

## 2021-05-30 DIAGNOSIS — R269 Unspecified abnormalities of gait and mobility: Secondary | ICD-10-CM

## 2021-05-30 DIAGNOSIS — R262 Difficulty in walking, not elsewhere classified: Secondary | ICD-10-CM

## 2021-05-30 DIAGNOSIS — M62838 Other muscle spasm: Secondary | ICD-10-CM

## 2021-05-30 DIAGNOSIS — M6281 Muscle weakness (generalized): Secondary | ICD-10-CM

## 2021-05-30 NOTE — Therapy (Signed)
[place] MAIN [facility] SERVICES [address] [place], [place], [address] Phone: [phone]   Fax:  [phone]  Physical Therapy Treatment/RECERT  Patient Details  Name: Christine Chavez MRN: [id number] Date of Birth: May 07, 1980 Referring Provider (PT): Elza Rafter   Encounter Date: 05/30/2021   PT End of Session - 05/30/21 1645     Visit Number 148    Number of Visits 172    Date for PT Re-Evaluation 08/22/21    Progress Note Due on Visit 150    PT Start Time 1015    PT Stop Time 1059    PT Time Calculation (min) 44 min    Activity Tolerance Patient tolerated treatment well;Patient limited by fatigue    Behavior During Therapy [facility] for tasks assessed/performed             Past Medical History:  Diagnosis Date   Complication of anesthesia    ? seizures after anesthesia    Headache    Migraines    Neurogenic bladder    Renal disorder    Vision abnormalities     Past Surgical History:  Procedure Laterality Date   ANTERIOR CRUCIATE LIGAMENT REPAIR  1997   APPENDECTOMY     COLONOSCOPY WITH PROPOFOL N/A 11/11/2018   Procedure: COLONOSCOPY WITH PROPOFOL;  Surgeon: [name], MD;  Location: [facility];  Service: Endoscopy;  Laterality: N/A;   CYSTOSCOPY WITH STENT PLACEMENT Right 04/17/2016   Procedure: CYSTOSCOPY WITH STENT PLACEMENT;  Surgeon: [name], MD;  Location: [facility] ORS;  Service: Urology;  Laterality: Right;   ESOPHAGOGASTRODUODENOSCOPY (EGD) WITH PROPOFOL N/A 11/11/2018   Procedure: ESOPHAGOGASTRODUODENOSCOPY (EGD) WITH PROPOFOL;  Surgeon: [name], MD;  Location: [facility] ENDOSCOPY;  Service: Endoscopy;  Laterality: N/A;   EXPLORATORY LAPAROTOMY  1999   KIDNEY STONE SURGERY  04/2016   REVISION UROSTOMY CUTANEOUS     REVISION UROSTOMY CUTANEOUS  01/10/2018   SUPRAPUBIC CATHETER PLACEMENT  08/2017   TONSILLECTOMY      There were no vitals filed for this visit.   Subjective  Assessment - 05/30/21 1250     Subjective Patient has had multiple doctor appointments since last session. Is returning to therapy after continuation of flare up with infection and severe pain in her back. Patient has meet with ortho about need for bilateral hip surgery in addition to her need for a hysterectomy. She is not sure when she will be having a hysterectomy.    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 Due to the rarity of her disorder it took years prior to diagnosis. 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  Patient has good days and bad days. Her good days allow for short ambulation (within room only not between rooms) and her bad days she is unable to ambulate.    Limitations Lifting;Standing;Walking;House hold activities;Other (comment)    How long can you stand comfortably? limited on bad days    How long can you walk comfortably? within room only on good days, unable on bad days    Patient Stated Goals to regain PLOF    Currently in Pain? Yes    Pain Score 8     Pain Location Back    Pain Orientation Lower;Mid  Pain Descriptors / Indicators Aching    Pain Type Chronic pain;Neuropathic pain    Pain Onset 1 to 4 weeks ago    Pain Frequency Constant                 RECERT   Supine:  Hamstring lengthening with leg on PT shoulder 60 seconds each LE; x2 trials Popliteal angle 60 seconds each LE x2 trials each LE Single knee to chest 60 seconds each LE Knee to cross body shoulder 60 seconds each LE LE rotation for low back relief x 60 seconds x 4 trials each  GTB abduction 20x  GTB marches 20x each LE posterior pelvic tilt 10x   sidelying: Clamshells 15x each  LE Reverse clamshells 15x each LE STM with implementation of effleurage and ptrissage to low back for pain reduction x 9 minutes  Seated: Forward trunk rollout 10x   Pt educated throughout session about proper posture and technique with exercises. Improved exercise technique, movement at target joints, use of target muscles after min to mod verbal, visual, tactile cues.   Patient's physical goals unable to be performed this session due to recent flare up of diagnosis, infection, and severe pain. Will attempt her goals next session if able to. Patient will benefit from eventual addition of pelvic therapy to current POC of neuro therapy due to upcoming hysterectomy. Patient agreeable to this plan. Patient will additionally benefit from back pain goal as it is  primary limitation at this time. Patient will continue to benefit from skilled PT to address gait deficits, standing and mobility tolerance, and strength to improve quality of life and level of independence with functional mobility and ADLs.               PT Education - 05/30/21 1254     Education Details body mechanics, pelvic floor PT, goals, POC    Person(s) Educated Patient    Methods Explanation;Demonstration;Tactile cues;Verbal cues    Comprehension Verbalized understanding;Returned demonstration;Verbal cues required;Tactile cues required              PT Short Term Goals - 12/09/20 1201       PT SHORT TERM GOAL #1   Title Patient will demonstrate coordinated diaphragmatic breathing with pelvic tilts to demonstrate improved control of diaphragm and TA, to allow for further strengthening of core musculature and decreased pelvic floor spasm.    Baseline Pt. demonstrates breathing dysfunction and poor PFM coordination evidenced by anal manometry As of 2/22: Pt. continues to have restriction in her diaphragm and near T/L junction but is able to intentionally use diaphragmatic breathing through the available ROM.     Time 5    Period Weeks    Status Achieved    Target Date 04/20/19      PT SHORT TERM GOAL #2   Title Patient will report a reduction in pain to no greater than 6/10 over the prior week to demonstrate symptom improvement.    Baseline Pain is 10/10 at worst, 2/10 at best As of 2/22: Pt. had achieved this goal for 1 week as of 1/21 but may have infection or other insult that caused pain to increase again. As of 4/29: Pain high of 8/10 but has been over-doing her activity over the past week. As of 6/10: Pt. was able to keep pain at 6 or below since prior visit by using TENS to help keep tension lower but has not had a full week to test if it will keep working.  As of 7/6: Pt. had met this goal but has a new UTI and it caused pain to spike to 8/10 over the weekend. 8/25: 6/10 due to back pain    Time 5    Period Weeks    Status Achieved    Target Date 01/13/21      PT SHORT TERM GOAL #3   Title Patient will demonstrate HEP x1 in the clinic to demonstrate understanding and proper form to allow for further improvement.    Baseline Pt. lacks knowledge of therepeutic exercises that can decrease her pain/Sx.    Time 5    Period Weeks    Status Achieved    Target Date 04/20/19      PT SHORT TERM GOAL #4   Title Patient will report consistent use of foot-stool (squatty-potty) for positioning with BM to decrease pain with BM and intra-abdominal pressure.    Baseline Pt. having constipation due to PFM dysfunction    Time 5    Period Weeks    Status Achieved    Target Date 04/20/19               PT Long Term Goals - 05/30/21 0001       PT LONG TERM GOAL #1   Title Patient will be independent with manual wheelchair mobility in public location such as store for increased independence.    Baseline 11/21: unable to push herself around entire store yet 12/15: Patient reports being able to push herself around store independently but requires multiple rest breaks and assistance with managing  shopping basket/items. 2/13: able to self propel but has to take rest breaks    Time 12    Period Weeks    Status Achieved      PT LONG TERM GOAL #2   Title Patient will increase 10 meter walk test to <30 seconds as to improve gait speed for better community ambulation and to reduce fall risk.    Baseline 6/2: 1 min 18 seconds with rollator 8/25: deferred due to excessive knee buckling during TUG 10/17: 1 min 38 seconds with frequent knee buckling 11/21: 1 min 11 seconds with knee buckling with rollator 12/15: deferred secondary to pain    Time 12    Period Weeks    Status On-going    Target Date 08/22/21      PT LONG TERM GOAL #3   Title Patient (< 40 years old) will complete five times sit to stand test in < 20 seconds indicating an increased LE strength and improved balance.    Baseline 6/2: 43 seconds with heavy BUE support 8/25: 40 seconds with SUE support 10/17: 21 seconds with SUE support 11/21: 20 seconds with SUE support 12/15: deferred secondary to pain    Time 12    Period Weeks    Status Partially Met    Target Date 08/22/21      PT LONG TERM GOAL #4   Title Pt. will be able to perform 1 hr. of light housework reqiring intermittent standing and walking without increased pain/fatigue >2 pts.    Baseline Pt. becomes fatigued washing fruits/vegetbles in small stages and has fatigue>3 pts. 3/14: able to perform but needs rest breaks 4/25: able to do but is fatiguing 6/2: able to perform on good days; unable to perform on bad days 8/25: 4-5/10 10/17: 40% of the time 12/15: not addressed this session 2/13: unable to due to extreme pain/fatigue    Time 12    Period  Weeks    Status Partially Met    Target Date 08/22/21      PT LONG TERM GOAL #5   Title Patient will reduce timed up and go to <30 seconds to reduce fall risk and demonstrate improved transfer/gait ability.    Baseline 8/25: 1 min 47 seconds with rollator 10/17: 1 min 2 seconds 11/21: 59 seconds 12/15: deferred  secondary to pain    Time 12    Period Weeks    Status Partially Met    Target Date 08/22/21      PT LONG TERM GOAL #6   Title Patient will report worst VAS of <4/10 in low back for return to PLOF and ability to perform mobility tasks.    Baseline 2/13: 10/10    Time 12    Period Weeks    Status New    Target Date 08/22/21                   Plan - 05/30/21 1646     Clinical Impression Statement Patient's physical goals unable to be performed this session due to recent flare up of diagnosis, infection, and severe pain. Will attempt her goals next session if able to. Patient will benefit from eventual addition of pelvic therapy to current POC of neuro therapy due to upcoming hysterectomy. Patient agreeable to this plan. Patient will additionally benefit from back pain goal as it is  primary limitation at this time. Pain reduction techniques tolerated well with patient improving by end of session with decreased pain. Patient will continue to benefit from skilled PT to address gait deficits, standing and mobility tolerance, and strength to improve quality of life and level of independence with functional mobility and ADLs.    Personal Factors and Comorbidities Comorbidity 3+;Transportation;Time since onset of injury/illness/exacerbation;Finances    Comorbidities neurologic insult with pending diagnosis, multiple recurrent UTI's, BLE weakness, depression, hereditary spastic paraplegia    Examination-Activity Limitations Caring for Others;Carry;Continence;Squat;Stairs;Toileting;Locomotion Level;Lift;Stand;Bed Mobility;Bend;Reach Overhead;Sit;Dressing;Transfers    Examination-Participation Restrictions Church;Cleaning;Community Activity;Driving;Interpersonal Relationship;Laundry;[employer] Work;Meal Prep;School;Volunteer    Stability/Clinical Decision Making Unstable/Unpredictable    Rehab Potential Fair    PT Frequency 2x / week    PT Duration 12 weeks    PT Treatment/Interventions ADLs/Self  Care Home Management;Aquatic Therapy;Biofeedback;Moist Heat;Electrical Stimulation;Cryotherapy;Traction;Ultrasound;Therapeutic activities;Functional mobility training;Stair training;Gait training;Therapeutic exercise;Balance training;Neuromuscular re-education;Patient/family education;Manual techniques;Dry needling;Scar mobilization;Energy conservation;Taping;Joint Manipulations;Spinal Manipulations;Visual/perceptual remediation/compensation;Passive range of motion;Wheelchair mobility training;DME Instruction;Orthotic Fit/Training    PT Next Visit Plan transfers, stabilization in standing, core activation    PT Home Exercise Plan Added Quad sets;Access Code: [email or web address] URL: [email or web address]    Consulted and Agree with Plan of Care Patient             Patient will benefit from skilled therapeutic intervention in order to improve the following deficits and impairments:  Abnormal gait, Decreased balance, Decreased endurance, Decreased mobility, Difficulty walking, Increased muscle spasms, Impaired sensation, Improper body mechanics, Impaired tone, Decreased activity tolerance, Decreased coordination, Decreased strength, Postural dysfunction, Pain, Impaired flexibility  Visit Diagnosis: Other muscle spasm  Abnormality of gait and mobility  Muscle weakness (generalized)  Difficulty in walking, not elsewhere classified     Problem List Patient Active Problem List   Diagnosis Date Noted   Seizure ([place]) 11/11/2018   Major depressive disorder, recurrent episode, moderate ([place]) 02/07/2018   Nephrolithiasis 04/16/2016   Numbness 07/28/2015   Bladder retention 06/23/2015   Abdominal pain 06/04/2015   Dizziness 05/18/2015   Neck pain 27/74/1287   Complicated migraine 86/76/7209  Other fatigue 04/28/2015   Abnormal finding on MRI of brain 04/28/2015   D (diarrhea) 03/29/2015   H/O disease 03/29/2015   Abnormal weight loss 03/29/2015   Muscle weakness (generalized)  03/14/2015   Headache, migraine 03/10/2015    Janna Arch, PT, DPT  05/30/2021, 4:53 PM  [place] MAIN [facility] SERVICES [place], [place], [address] Phone: [phone]   Fax:  [phone]  Name: Gabriel Paulding MRN: [id number] Date of Birth: 1980/06/21

## 2021-06-02 ENCOUNTER — Ambulatory Visit: Payer: BC Managed Care – PPO

## 2021-06-02 ENCOUNTER — Other Ambulatory Visit: Payer: Self-pay

## 2021-06-02 DIAGNOSIS — M6281 Muscle weakness (generalized): Secondary | ICD-10-CM

## 2021-06-02 DIAGNOSIS — M62838 Other muscle spasm: Secondary | ICD-10-CM | POA: Diagnosis not present

## 2021-06-02 DIAGNOSIS — R269 Unspecified abnormalities of gait and mobility: Secondary | ICD-10-CM

## 2021-06-02 NOTE — Therapy (Signed)
[place] MAIN [facility] SERVICES [address] [place], [place], [address] Phone: [phone]   Fax:  [phone]  Physical Therapy Treatment  Patient Details  Name: Christine Chavez MRN: [id number] Date of Birth: 11-11-1980 Referring Provider (PT): Elza Rafter   Encounter Date: 06/02/2021   PT End of Session - 06/02/21 1230     Visit Number 149    Number of Visits 172    Date for PT Re-Evaluation 08/22/21    Progress Note Due on Visit 150    PT Start Time 1100    PT Stop Time 1156    PT Time Calculation (min) 56 min    Activity Tolerance Patient tolerated treatment well;Patient limited by fatigue    Behavior During Therapy [facility] for tasks assessed/performed             Past Medical History:  Diagnosis Date   Complication of anesthesia    ? seizures after anesthesia    Headache    Migraines    Neurogenic bladder    Renal disorder    Vision abnormalities     Past Surgical History:  Procedure Laterality Date   ANTERIOR CRUCIATE LIGAMENT REPAIR  1997   APPENDECTOMY     COLONOSCOPY WITH PROPOFOL N/A 11/11/2018   Procedure: COLONOSCOPY WITH PROPOFOL;  Surgeon: [name], MD;  Location: [facility] ENDOSCOPY;  Service: Endoscopy;  Laterality: N/A;   CYSTOSCOPY WITH STENT PLACEMENT Right 04/17/2016   Procedure: CYSTOSCOPY WITH STENT PLACEMENT;  Surgeon: [name], MD;  Location: [facility] ORS;  Service: Urology;  Laterality: Right;   ESOPHAGOGASTRODUODENOSCOPY (EGD) WITH PROPOFOL N/A 11/11/2018   Procedure: ESOPHAGOGASTRODUODENOSCOPY (EGD) WITH PROPOFOL;  Surgeon: [name], MD;  Location: [facility] ENDOSCOPY;  Service: Endoscopy;  Laterality: N/A;   EXPLORATORY LAPAROTOMY  1999   KIDNEY STONE SURGERY  04/2016   REVISION UROSTOMY CUTANEOUS     REVISION UROSTOMY CUTANEOUS  01/10/2018   SUPRAPUBIC CATHETER PLACEMENT  08/2017   TONSILLECTOMY      There were no vitals filed for this visit.   Subjective Assessment -  06/02/21 1228     Subjective Patient reports she started feeling ill yesterday and today, now has a migraine in addition to back pain.    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 Due to the rarity of her disorder it took years prior to diagnosis. 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  Patient has good days and bad days. Her good days allow for short ambulation (within room only not between rooms) and her bad days she is unable to ambulate.    Limitations Lifting;Standing;Walking;House hold activities;Other (comment)    How long can you stand comfortably? limited on bad days    How long can you walk comfortably? within room only on good days, unable on bad days    Patient Stated Goals to regain PLOF    Currently in Pain? Yes    Pain Score 8     Pain Location Back    Pain Orientation Mid;Lower    Pain Descriptors / Indicators Aching    Pain Type Chronic pain    Pain Onset 1 to 4 weeks ago    Pain Frequency Constant    Multiple Pain Sites Yes  Pain Score 5    Pain Location Head    Pain Orientation Upper    Pain Descriptors / Indicators Headache    Pain Type Acute pain                          Supine:  Hamstring lengthening with leg on PT shoulder 60 seconds each LE; x2 trials Popliteal angle 60 seconds each LE x2 trials each LE Single knee to chest 60 seconds each LE Knee to cross body shoulder 60 seconds each LE LE rotation for low back relief x 60 seconds x 4 trials each  posterior pelvic tilt 10x   supine STM with implementation of effleurage and ptrissage to thoracic and lumbar paraspinals for pain reduction x 9 minutes Grade I-III mobilizations CPA and  UPA lumbar and thoracic paraspinal 8x 10 seconds each level.     Seated: RTB hamstring curl 15x each LE RTB abduction 15x  Car transfer with min/mod A for transition from transport chair to car.    Pt educated throughout session about proper posture and technique with exercises. Improved exercise technique, movement at target joints, use of target muscles after min to mod verbal, visual, tactile cues.     Patient presents with high tone levels limiting her ability to stand and perform mobility. Multiple trigger points found in thoracic paraspinals and multiple areas of hypomobility with cavitations noted in both lumbar and thoracic spines. Patient will continue to benefit from skilled PT to address gait deficits, standing and mobility tolerance, and strength to improve quality of life and level of independence with functional mobility and ADLs.             PT Education - 06/02/21 1229     Education Details body mechanics, body mechanics, car transfer    Person(s) Educated Patient    Methods Explanation;Demonstration;Tactile cues;Verbal cues    Comprehension Verbalized understanding;Returned demonstration;Verbal cues required;Tactile cues required              PT Short Term Goals - 12/09/20 1201       PT SHORT TERM GOAL #1   Title Patient will demonstrate coordinated diaphragmatic breathing with pelvic tilts to demonstrate improved control of diaphragm and TA, to allow for further strengthening of core musculature and decreased pelvic floor spasm.    Baseline Pt. demonstrates breathing dysfunction and poor PFM coordination evidenced by anal manometry As of 2/22: Pt. continues to have restriction in her diaphragm and near T/L junction but is able to intentionally use diaphragmatic breathing through the available ROM.    Time 5    Period Weeks    Status Achieved    Target Date 04/20/19      PT SHORT TERM GOAL #2   Title Patient will report a reduction in pain to no  greater than 6/10 over the prior week to demonstrate symptom improvement.    Baseline Pain is 10/10 at worst, 2/10 at best As of 2/22: Pt. had achieved this goal for 1 week as of 1/21 but may have infection or other insult that caused pain to increase again. As of 4/29: Pain high of 8/10 but has been over-doing her activity over the past week. As of 6/10: Pt. was able to keep pain at 6 or below since prior visit by using TENS to help keep tension lower but has not had a full week to test if it will keep working. As of 7/6: Pt. had met this goal  but has a new UTI and it caused pain to spike to 8/10 over the weekend. 8/25: 6/10 due to back pain    Time 5    Period Weeks    Status Achieved    Target Date 01/13/21      PT SHORT TERM GOAL #3   Title Patient will demonstrate HEP x1 in the clinic to demonstrate understanding and proper form to allow for further improvement.    Baseline Pt. lacks knowledge of therepeutic exercises that can decrease her pain/Sx.    Time 5    Period Weeks    Status Achieved    Target Date 04/20/19      PT SHORT TERM GOAL #4   Title Patient will report consistent use of foot-stool (squatty-potty) for positioning with BM to decrease pain with BM and intra-abdominal pressure.    Baseline Pt. having constipation due to PFM dysfunction    Time 5    Period Weeks    Status Achieved    Target Date 04/20/19               PT Long Term Goals - 05/30/21 0001       PT LONG TERM GOAL #1   Title Patient will be independent with manual wheelchair mobility in public location such as store for increased independence.    Baseline 11/21: unable to push herself around entire store yet 12/15: Patient reports being able to push herself around store independently but requires multiple rest breaks and assistance with managing shopping basket/items. 2/13: able to self propel but has to take rest breaks    Time 12    Period Weeks    Status Achieved      PT LONG TERM GOAL #2    Title Patient will increase 10 meter walk test to <30 seconds as to improve gait speed for better community ambulation and to reduce fall risk.    Baseline 6/2: 1 min 18 seconds with rollator 8/25: deferred due to excessive knee buckling during TUG 10/17: 1 min 38 seconds with frequent knee buckling 11/21: 1 min 11 seconds with knee buckling with rollator 12/15: deferred secondary to pain    Time 12    Period Weeks    Status On-going    Target Date 08/22/21      PT LONG TERM GOAL #3   Title Patient (< 75 years old) will complete five times sit to stand test in < 20 seconds indicating an increased LE strength and improved balance.    Baseline 6/2: 43 seconds with heavy BUE support 8/25: 40 seconds with SUE support 10/17: 21 seconds with SUE support 11/21: 20 seconds with SUE support 12/15: deferred secondary to pain    Time 12    Period Weeks    Status Partially Met    Target Date 08/22/21      PT LONG TERM GOAL #4   Title Pt. will be able to perform 1 hr. of light housework reqiring intermittent standing and walking without increased pain/fatigue >2 pts.    Baseline Pt. becomes fatigued washing fruits/vegetbles in small stages and has fatigue>3 pts. 3/14: able to perform but needs rest breaks 4/25: able to do but is fatiguing 6/2: able to perform on good days; unable to perform on bad days 8/25: 4-5/10 10/17: 40% of the time 12/15: not addressed this session 2/13: unable to due to extreme pain/fatigue    Time 12    Period Weeks    Status Partially Met  Target Date 08/22/21      PT LONG TERM GOAL #5   Title Patient will reduce timed up and go to <30 seconds to reduce fall risk and demonstrate improved transfer/gait ability.    Baseline 8/25: 1 min 47 seconds with rollator 10/17: 1 min 2 seconds 11/21: 59 seconds 12/15: deferred secondary to pain    Time 12    Period Weeks    Status Partially Met    Target Date 08/22/21      PT LONG TERM GOAL #6   Title Patient will report worst VAS  of <4/10 in low back for return to PLOF and ability to perform mobility tasks.    Baseline 2/13: 10/10    Time 12    Period Weeks    Status New    Target Date 08/22/21                   Plan - 06/02/21 1247     Clinical Impression Statement Patient presents with high tone levels limiting her ability to stand and perform mobility. Multiple trigger points found in thoracic paraspinals and multiple areas of hypomobility with cavitations noted in both lumbar and thoracic spines. Patient will continue to benefit from skilled PT to address gait deficits, standing and mobility tolerance, and strength to improve quality of life and level of independence with functional mobility and ADLs.    Personal Factors and Comorbidities Comorbidity 3+;Transportation;Time since onset of injury/illness/exacerbation;Finances    Comorbidities neurologic insult with pending diagnosis, multiple recurrent UTI's, BLE weakness, depression, hereditary spastic paraplegia    Examination-Activity Limitations Caring for Others;Carry;Continence;Squat;Stairs;Toileting;Locomotion Level;Lift;Stand;Bed Mobility;Bend;Reach Overhead;Sit;Dressing;Transfers    Examination-Participation Restrictions Church;Cleaning;Community Activity;Driving;Interpersonal Relationship;Laundry;[employer] Work;Meal Prep;School;Volunteer    Stability/Clinical Decision Making Unstable/Unpredictable    Rehab Potential Fair    PT Frequency 2x / week    PT Duration 12 weeks    PT Treatment/Interventions ADLs/Self Care Home Management;Aquatic Therapy;Biofeedback;Moist Heat;Electrical Stimulation;Cryotherapy;Traction;Ultrasound;Therapeutic activities;Functional mobility training;Stair training;Gait training;Therapeutic exercise;Balance training;Neuromuscular re-education;Patient/family education;Manual techniques;Dry needling;Scar mobilization;Energy conservation;Taping;Joint Manipulations;Spinal Manipulations;Visual/perceptual remediation/compensation;Passive  range of motion;Wheelchair mobility training;DME Instruction;Orthotic Fit/Training    PT Next Visit Plan transfers, stabilization in standing, core activation    PT Home Exercise Plan Added Quad sets;Access Code: [email or web address] URL: [email or web address]    Consulted and Agree with Plan of Care Patient             Patient will benefit from skilled therapeutic intervention in order to improve the following deficits and impairments:  Abnormal gait, Decreased balance, Decreased endurance, Decreased mobility, Difficulty walking, Increased muscle spasms, Impaired sensation, Improper body mechanics, Impaired tone, Decreased activity tolerance, Decreased coordination, Decreased strength, Postural dysfunction, Pain, Impaired flexibility  Visit Diagnosis: Other muscle spasm  Abnormality of gait and mobility  Muscle weakness (generalized)     Problem List Patient Active Problem List   Diagnosis Date Noted   Seizure ([place]) 11/11/2018   Major depressive disorder, recurrent episode, moderate ([place]) 02/07/2018   Nephrolithiasis 04/16/2016   Numbness 07/28/2015   Bladder retention 06/23/2015   Abdominal pain 06/04/2015   Dizziness 05/18/2015   Neck pain 30/86/5784   Complicated migraine 69/62/9528   Other fatigue 04/28/2015   Abnormal finding on MRI of brain 04/28/2015   D (diarrhea) 03/29/2015   H/O disease 03/29/2015   Abnormal weight loss 03/29/2015   Muscle weakness (generalized) 03/14/2015   Headache, migraine 03/10/2015    Janna Arch, PT, DPT  06/02/2021, 12:49 PM  [place] MAIN [facility] SERVICES [address] [place], [place], [address]  Phone: [phone]   Fax:  [phone]  Name: Alayzha An MRN: [id number] Date of Birth: [birth year]

## 2021-06-06 ENCOUNTER — Ambulatory Visit: Payer: BC Managed Care – PPO

## 2021-06-06 ENCOUNTER — Other Ambulatory Visit: Payer: Self-pay

## 2021-06-06 DIAGNOSIS — M6281 Muscle weakness (generalized): Secondary | ICD-10-CM

## 2021-06-06 DIAGNOSIS — M62838 Other muscle spasm: Secondary | ICD-10-CM | POA: Diagnosis not present

## 2021-06-06 DIAGNOSIS — R269 Unspecified abnormalities of gait and mobility: Secondary | ICD-10-CM

## 2021-06-06 NOTE — Therapy (Signed)
[place] [facility] [address] [place], [place], [address] Phone: [phone]   Fax:  [phone]  Physical Therapy Treatment /Physical Therapy Progress Note   Dates of reporting period  03/31/21   to   06/06/21   Patient Details  Name: Christine Chavez MRN: [id number] Date of Birth: 1980/10/22 Referring Provider (PT): Elza Rafter   Encounter Date: 06/06/2021   PT End of Session - 06/06/21 1252     Visit Number 150    Number of Visits 172    Date for PT Re-Evaluation 08/22/21    Authorization Type next session 1/10 PN 2/20    Progress Note Due on Visit 150    PT Start Time 1015    PT Stop Time 1059    PT Time Calculation (min) 44 min    Activity Tolerance Patient tolerated treatment well;Patient limited by fatigue    Behavior During Therapy [facility] for tasks assessed/performed             Past Medical History:  Diagnosis Date   Complication of anesthesia    ? seizures after anesthesia    Headache    Migraines    Neurogenic bladder    Renal disorder    Vision abnormalities     Past Surgical History:  Procedure Laterality Date   ANTERIOR CRUCIATE LIGAMENT REPAIR  1997   APPENDECTOMY     COLONOSCOPY WITH PROPOFOL N/A 11/11/2018   Procedure: COLONOSCOPY WITH PROPOFOL;  Surgeon: [name], MD;  Location: [facility] ENDOSCOPY;  Service: Endoscopy;  Laterality: N/A;   CYSTOSCOPY WITH STENT PLACEMENT Right 04/17/2016   Procedure: CYSTOSCOPY WITH STENT PLACEMENT;  Surgeon: [name], MD;  Location: [facility] ORS;  Service: Urology;  Laterality: Right;   ESOPHAGOGASTRODUODENOSCOPY (EGD) WITH PROPOFOL N/A 11/11/2018   Procedure: ESOPHAGOGASTRODUODENOSCOPY (EGD) WITH PROPOFOL;  Surgeon: [name], MD;  Location: [facility] ENDOSCOPY;  Service: Endoscopy;  Laterality: N/A;   EXPLORATORY LAPAROTOMY  1999   KIDNEY STONE SURGERY  04/2016   REVISION UROSTOMY CUTANEOUS     REVISION UROSTOMY CUTANEOUS  01/10/2018    SUPRAPUBIC CATHETER PLACEMENT  08/2017   TONSILLECTOMY      There were no vitals filed for this visit.   Subjective Assessment - 06/06/21 1337     Subjective Patient will go to gyno friday to determine date of hysterectomy. Has been having pain in hips and back.    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 Due to the rarity of her disorder it took years prior to diagnosis. 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  Patient has good days and bad days. Her good days allow for short ambulation (within room only not between rooms) and her bad days she is unable to ambulate.    Limitations Lifting;Standing;Walking;House hold activities;Other (comment)    How long can you stand comfortably? limited on bad days    How long can you walk comfortably? within room only on good days, unable on bad days    Patient Stated Goals to regain PLOF    Currently in Pain? Yes    Pain Score 8     Pain Location Back    Pain Orientation Lower;Mid    Pain Descriptors / Indicators Aching  Pain Type Chronic pain    Pain Onset 1 to 4 weeks ago    Pain Frequency Constant              TUG: 1 min 4 seconds    Supine:  Hamstring lengthening with leg on PT shoulder 60 seconds each LE; x2 trials Popliteal angle 60 seconds each LE x2 trials each LE Single knee to chest 60 seconds each LE Knee to cross body shoulder 60 seconds each LE LE rotation for low back relief x 60 seconds x 4 trials each  GTB abduction 20x  GTB marches 20x each LE posterior pelvic tilt 10x   sidelying: Clamshells 15x each LE Reverse clamshells 15x each LE  Seated: RTB hamstring curl 15x each LE RTB abduction  15x      Pt educated throughout session about proper posture and technique with exercises. Improved exercise technique, movement at target joints, use of target muscles after min to mod verbal, visual, tactile cues.  Patient's condition has the potential to improve in response to therapy. Maximum improvement is yet to be obtained. The anticipated improvement is attainable and reasonable in a generally predictable time.  Patient reports her surgeries will be soon.    Patient was able to perform TUG today with significant pain in hips and buckling of LE's. Other standing/walking goals deferred at this time. Patient will have surgery soon for her hip dysfunction in addition to her hysterectomy. Despite her high pain levels patient remains highly motivated and eager for progression of mobility. atient's condition has the potential to improve in response to therapy. Maximum improvement is yet to be obtained. The anticipated improvement is attainable and reasonable in a generally predictable time.  Patient will continue to benefit from skilled PT to address gait deficits, standing and mobility tolerance, and strength to improve quality of life and level of independence with functional mobility and ADLs.          PT Education - 06/06/21 1338     Education Details exercise technique, body mechanics, TUG    Person(s) Educated Patient    Methods Explanation;Demonstration;Tactile cues;Verbal cues    Comprehension Verbalized understanding;Returned demonstration;Verbal cues required;Tactile cues required              PT Short Term Goals - 12/09/20 1201       PT SHORT TERM GOAL #1   Title Patient will demonstrate coordinated diaphragmatic breathing with pelvic tilts to demonstrate improved control of diaphragm and TA, to allow for further strengthening of core musculature and decreased pelvic floor spasm.    Baseline Pt. demonstrates breathing dysfunction and poor PFM coordination evidenced  by anal manometry As of 2/22: Pt. continues to have restriction in her diaphragm and near T/L junction but is able to intentionally use diaphragmatic breathing through the available ROM.    Time 5    Period Weeks    Status Achieved    Target Date 04/20/19      PT SHORT TERM GOAL #2   Title Patient will report a reduction in pain to no greater than 6/10 over the prior week to demonstrate symptom improvement.    Baseline Pain is 10/10 at worst, 2/10 at best As of 2/22: Pt. had achieved this goal for 1 week as of 1/21 but may have infection or other insult that caused pain to increase again. As of 4/29: Pain high of 8/10 but has been over-doing her activity over the past week. As of 6/10: Pt. was able to  keep pain at 6 or below since prior visit by using TENS to help keep tension lower but has not had a full week to test if it will keep working. As of 7/6: Pt. had met this goal but has a new UTI and it caused pain to spike to 8/10 over the weekend. 8/25: 6/10 due to back pain    Time 5    Period Weeks    Status Achieved    Target Date 01/13/21      PT SHORT TERM GOAL #3   Title Patient will demonstrate HEP x1 in the clinic to demonstrate understanding and proper form to allow for further improvement.    Baseline Pt. lacks knowledge of therepeutic exercises that can decrease her pain/Sx.    Time 5    Period Weeks    Status Achieved    Target Date 04/20/19      PT SHORT TERM GOAL #4   Title Patient will report consistent use of foot-stool (squatty-potty) for positioning with BM to decrease pain with BM and intra-abdominal pressure.    Baseline Pt. having constipation due to PFM dysfunction    Time 5    Period Weeks    Status Achieved    Target Date 04/20/19               PT Long Term Goals - 06/06/21 0001       PT LONG TERM GOAL #1   Title Patient will be independent with manual wheelchair mobility in public location such as store for increased independence.    Baseline 11/21:  unable to push herself around entire store yet 12/15: Patient reports being able to push herself around store independently but requires multiple rest breaks and assistance with managing shopping basket/items. 2/13: able to self propel but has to take rest breaks    Time 12    Period Weeks    Status Achieved      PT LONG TERM GOAL #2   Title Patient will increase 10 meter walk test to <30 seconds as to improve gait speed for better community ambulation and to reduce fall risk.    Baseline 6/2: 1 min 18 seconds with rollator 8/25: deferred due to excessive knee buckling during TUG 10/17: 1 min 38 seconds with frequent knee buckling 11/21: 1 min 11 seconds with knee buckling with rollator 12/15: deferred secondary to pain 2/20: unable to do due to bilateral LE buckling    Time 12    Period Weeks    Status On-going    Target Date 08/22/21      PT LONG TERM GOAL #3   Title Patient (< 108 years old) will complete five times sit to stand test in < 20 seconds indicating an increased LE strength and improved balance.    Baseline 6/2: 43 seconds with heavy BUE support 8/25: 40 seconds with SUE support 10/17: 21 seconds with SUE support 11/21: 20 seconds with SUE support 12/15: deferred secondary to pain 2/20: bilateral knee buckling    Time 12    Period Weeks    Status Partially Met    Target Date 08/22/21      PT LONG TERM GOAL #4   Title Pt. will be able to perform 1 hr. of light housework reqiring intermittent standing and walking without increased pain/fatigue >2 pts.    Baseline Pt. becomes fatigued washing fruits/vegetbles in small stages and has fatigue>3 pts. 3/14: able to perform but needs rest breaks 4/25: able to do  but is fatiguing 6/2: able to perform on good days; unable to perform on bad days 8/25: 4-5/10 10/17: 40% of the time 12/15: not addressed this session 2/13: unable to due to extreme pain/fatigue    Time 12    Period Weeks    Status Partially Met    Target Date 08/22/21       PT LONG TERM GOAL #5   Title Patient will reduce timed up and go to <30 seconds to reduce fall risk and demonstrate improved transfer/gait ability.    Baseline 8/25: 1 min 47 seconds with rollator 10/17: 1 min 2 seconds 11/21: 59 seconds 12/15: deferred secondary to pain 2/20: 1 min 4 seconds with rollator and severe knee buckling    Time 12    Period Weeks    Status Partially Met    Target Date 08/22/21      PT LONG TERM GOAL #6   Title Patient will report worst VAS of <4/10 in low back for return to PLOF and ability to perform mobility tasks.    Baseline 2/13: 10/10 2/20: 10/10    Time 12    Period Weeks    Status On-going    Target Date 08/22/21                   Plan - 06/06/21 1341     Clinical Impression Statement Patient was able to perform TUG today with significant pain in hips and buckling of LE's. Other standing/walking goals deferred at this time. Patient will have surgery soon for her hip dysfunction in addition to her hysterectomy. Despite her high pain levels patient remains highly motivated and eager for progression of mobility. atient's condition has the potential to improve in response to therapy. Maximum improvement is yet to be obtained. The anticipated improvement is attainable and reasonable in a generally predictable time.  Patient will continue to benefit from skilled PT to address gait deficits, standing and mobility tolerance, and strength to improve quality of life and level of independence with functional mobility and ADLs.    Personal Factors and Comorbidities Comorbidity 3+;Transportation;Time since onset of injury/illness/exacerbation;Finances    Comorbidities neurologic insult with pending diagnosis, multiple recurrent UTI's, BLE weakness, depression, hereditary spastic paraplegia    Examination-Activity Limitations Caring for Others;Carry;Continence;Squat;Stairs;Toileting;Locomotion Level;Lift;Stand;Bed Mobility;Bend;Reach  Overhead;Sit;Dressing;Transfers    Examination-Participation Restrictions Church;Cleaning;Community Activity;Driving;Interpersonal Relationship;Laundry;[employer] Work;Meal Prep;School;Volunteer    Stability/Clinical Decision Making Unstable/Unpredictable    Rehab Potential Fair    PT Frequency 2x / week    PT Duration 12 weeks    PT Treatment/Interventions ADLs/Self Care Home Management;Aquatic Therapy;Biofeedback;Moist Heat;Electrical Stimulation;Cryotherapy;Traction;Ultrasound;Therapeutic activities;Functional mobility training;Stair training;Gait training;Therapeutic exercise;Balance training;Neuromuscular re-education;Patient/family education;Manual techniques;Dry needling;Scar mobilization;Energy conservation;Taping;Joint Manipulations;Spinal Manipulations;Visual/perceptual remediation/compensation;Passive range of motion;Wheelchair mobility training;DME Instruction;Orthotic Fit/Training    PT Next Visit Plan transfers, stabilization in standing, core activation    PT Home Exercise Plan Added Quad sets;Access Code: [email or web address] URL: [email or web address]    Consulted and Agree with Plan of Care Patient             Patient will benefit from skilled therapeutic intervention in order to improve the following deficits and impairments:  Abnormal gait, Decreased balance, Decreased endurance, Decreased mobility, Difficulty walking, Increased muscle spasms, Impaired sensation, Improper body mechanics, Impaired tone, Decreased activity tolerance, Decreased coordination, Decreased strength, Postural dysfunction, Pain, Impaired flexibility  Visit Diagnosis: Other muscle spasm  Abnormality of gait and mobility  Muscle weakness (generalized)     Problem List Patient Active Problem List   Diagnosis Date Noted  Seizure ([place]) 11/11/2018   Major depressive disorder, recurrent episode, moderate ([place]) 02/07/2018   Nephrolithiasis 04/16/2016   Numbness 07/28/2015   Bladder retention  06/23/2015   Abdominal pain 06/04/2015   Dizziness 05/18/2015   Neck pain 42/32/0094   Complicated migraine 17/91/9957   Other fatigue 04/28/2015   Abnormal finding on MRI of brain 04/28/2015   D (diarrhea) 03/29/2015   H/O disease 03/29/2015   Abnormal weight loss 03/29/2015   Muscle weakness (generalized) 03/14/2015   Headache, migraine 03/10/2015    Janna Arch, PT, DPT  06/06/2021, 5:32 PM  [place] [facility] [name] [place], [place], [address] Phone: [phone]   Fax:  [phone]  Name: Christine Chavez MRN: [id number] Date of Birth: 1980/11/05

## 2021-06-09 ENCOUNTER — Other Ambulatory Visit: Payer: Self-pay

## 2021-06-09 ENCOUNTER — Ambulatory Visit: Payer: BC Managed Care – PPO

## 2021-06-09 DIAGNOSIS — M6281 Muscle weakness (generalized): Secondary | ICD-10-CM

## 2021-06-09 DIAGNOSIS — M62838 Other muscle spasm: Secondary | ICD-10-CM

## 2021-06-09 DIAGNOSIS — R269 Unspecified abnormalities of gait and mobility: Secondary | ICD-10-CM

## 2021-06-09 NOTE — Therapy (Signed)
[place] MAIN [facility] SERVICES [address] [place], [place], [address] Phone: [phone]   Fax:  [phone]  Physical Therapy Treatment  Patient Details  Name: Christine Chavez MRN: [id number] Date of Birth: July 07, 1980 Referring Provider (PT): Elza Rafter   Encounter Date: 06/09/2021   PT End of Session - 06/09/21 1258     Visit Number 151    Number of Visits 172    Date for PT Re-Evaluation 08/22/21    Authorization Type 1/10 PN 2/20    Progress Note Due on Visit 150    PT Start Time 1106    PT Stop Time 1152    PT Time Calculation (min) 46 min    Activity Tolerance Patient tolerated treatment well;Patient limited by fatigue    Behavior During Therapy [facility] for tasks assessed/performed             Past Medical History:  Diagnosis Date   Complication of anesthesia    ? seizures after anesthesia    Headache    Migraines    Neurogenic bladder    Renal disorder    Vision abnormalities     Past Surgical History:  Procedure Laterality Date   ANTERIOR CRUCIATE LIGAMENT REPAIR  1997   APPENDECTOMY     COLONOSCOPY WITH PROPOFOL N/A 11/11/2018   Procedure: COLONOSCOPY WITH PROPOFOL;  Surgeon: [name], MD;  Location: [facility] ENDOSCOPY;  Service: Endoscopy;  Laterality: N/A;   CYSTOSCOPY WITH STENT PLACEMENT Right 04/17/2016   Procedure: CYSTOSCOPY WITH STENT PLACEMENT;  Surgeon: [name], MD;  Location: [facility] ORS;  Service: Urology;  Laterality: Right;   ESOPHAGOGASTRODUODENOSCOPY (EGD) WITH PROPOFOL N/A 11/11/2018   Procedure: ESOPHAGOGASTRODUODENOSCOPY (EGD) WITH PROPOFOL;  Surgeon: [name], MD;  Location: [facility] ENDOSCOPY;  Service: Endoscopy;  Laterality: N/A;   EXPLORATORY LAPAROTOMY  1999   KIDNEY STONE SURGERY  04/2016   REVISION UROSTOMY CUTANEOUS     REVISION UROSTOMY CUTANEOUS  01/10/2018   SUPRAPUBIC CATHETER PLACEMENT  08/2017   TONSILLECTOMY      There were no vitals filed for this  visit.   Subjective Assessment - 06/09/21 1255     Subjective Patient has a post op appointment after her PT session today. Will find out friday when her hysterectomy will be. Had severe nausea and pain yesterday, nausea is better but pain continues to be present.    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 Due to the rarity of her disorder it took years prior to diagnosis. 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  Patient has good days and bad days. Her good days allow for short ambulation (within room only not between rooms) and her bad days she is unable to ambulate.    Limitations Lifting;Standing;Walking;House hold activities;Other (comment)    How long can you stand comfortably? limited on bad days    How long can you walk comfortably? within room only on good days, unable on bad days    Patient Stated Goals to regain PLOF    Currently in Pain? Yes    Pain Score 8     Pain Location Back    Pain Orientation Lower;Mid    Pain Descriptors / Indicators Aching    Pain Type  Chronic pain    Pain Onset 1 to 4 weeks ago    Pain Frequency Constant                    5x STS: 18 seconds with pain and knee buckling       Supine:  Hamstring lengthening with leg on PT shoulder 60 seconds each LE; x2 trials Popliteal angle 60 seconds each LE x2 trials each LE Single knee to chest 60 seconds each LE Knee to cross body shoulder 60 seconds each LE LE rotation for low back relief x 60 seconds x 4 trials each  GTB abduction 20x  GTB marches 20x each LE posterior pelvic tilt 10x   sidelying: Clamshells 15x each LE Reverse clamshells 15x each LE   Prone: STM with implementation  of effleurage and ptrissage x 7 minutes for pain reduction of lumbar and thoracic pain.    Pt educated throughout session about proper posture and technique with exercises. Improved exercise technique, movement at target joints, use of target muscles after min to mod verbal, visual, tactile cues.    Patient will discuss PT needs with physician and pre-op appointment today. Will additionally find out when she will have hysterectomy to add pelvic to POC. Patient is highly motivated despite high pain levels. She is able to perform STS in 18 seconds which is improved despite her hip injuries. Patient reports decreased pain by end of session. Patient will continue to benefit from skilled PT to address gait deficits, standing and mobility tolerance, and strength to improve quality of life and level of independence with functional mobility and ADLs.                 PT Education - 06/09/21 1256     Education Details exercise technique, body mechanics, pain reduction technique    Person(s) Educated Patient    Methods Explanation;Demonstration;Tactile cues;Verbal cues    Comprehension Verbalized understanding;Returned demonstration;Verbal cues required;Tactile cues required              PT Short Term Goals - 12/09/20 1201       PT SHORT TERM GOAL #1   Title Patient will demonstrate coordinated diaphragmatic breathing with pelvic tilts to demonstrate improved control of diaphragm and TA, to allow for further strengthening of core musculature and decreased pelvic floor spasm.    Baseline Pt. demonstrates breathing dysfunction and poor PFM coordination evidenced by anal manometry As of 2/22: Pt. continues to have restriction in her diaphragm and near T/L junction but is able to intentionally use diaphragmatic breathing through the available ROM.    Time 5    Period Weeks    Status Achieved    Target Date 04/20/19      PT SHORT TERM GOAL #2   Title Patient will report a reduction in  pain to no greater than 6/10 over the prior week to demonstrate symptom improvement.    Baseline Pain is 10/10 at worst, 2/10 at best As of 2/22: Pt. had achieved this goal for 1 week as of 1/21 but may have infection or other insult that caused pain to increase again. As of 4/29: Pain high of 8/10 but has been over-doing her activity over the past week. As of 6/10: Pt. was able to keep pain at 6 or below since prior visit by using TENS to help keep tension lower but has not had a full week to test if it will keep working. As of 7/6: Pt. had met  this goal but has a new UTI and it caused pain to spike to 8/10 over the weekend. 8/25: 6/10 due to back pain    Time 5    Period Weeks    Status Achieved    Target Date 01/13/21      PT SHORT TERM GOAL #3   Title Patient will demonstrate HEP x1 in the clinic to demonstrate understanding and proper form to allow for further improvement.    Baseline Pt. lacks knowledge of therepeutic exercises that can decrease her pain/Sx.    Time 5    Period Weeks    Status Achieved    Target Date 04/20/19      PT SHORT TERM GOAL #4   Title Patient will report consistent use of foot-stool (squatty-potty) for positioning with BM to decrease pain with BM and intra-abdominal pressure.    Baseline Pt. having constipation due to PFM dysfunction    Time 5    Period Weeks    Status Achieved    Target Date 04/20/19               PT Long Term Goals - 06/06/21 0001       PT LONG TERM GOAL #1   Title Patient will be independent with manual wheelchair mobility in public location such as store for increased independence.    Baseline 11/21: unable to push herself around entire store yet 12/15: Patient reports being able to push herself around store independently but requires multiple rest breaks and assistance with managing shopping basket/items. 2/13: able to self propel but has to take rest breaks    Time 12    Period Weeks    Status Achieved      PT LONG TERM  GOAL #2   Title Patient will increase 10 meter walk test to <30 seconds as to improve gait speed for better community ambulation and to reduce fall risk.    Baseline 6/2: 1 min 18 seconds with rollator 8/25: deferred due to excessive knee buckling during TUG 10/17: 1 min 38 seconds with frequent knee buckling 11/21: 1 min 11 seconds with knee buckling with rollator 12/15: deferred secondary to pain 2/20: unable to do due to bilateral LE buckling    Time 12    Period Weeks    Status On-going    Target Date 08/22/21      PT LONG TERM GOAL #3   Title Patient (< 50 years old) will complete five times sit to stand test in < 20 seconds indicating an increased LE strength and improved balance.    Baseline 6/2: 43 seconds with heavy BUE support 8/25: 40 seconds with SUE support 10/17: 21 seconds with SUE support 11/21: 20 seconds with SUE support 12/15: deferred secondary to pain 2/20: bilateral knee buckling    Time 12    Period Weeks    Status Partially Met    Target Date 08/22/21      PT LONG TERM GOAL #4   Title Pt. will be able to perform 1 hr. of light housework reqiring intermittent standing and walking without increased pain/fatigue >2 pts.    Baseline Pt. becomes fatigued washing fruits/vegetbles in small stages and has fatigue>3 pts. 3/14: able to perform but needs rest breaks 4/25: able to do but is fatiguing 6/2: able to perform on good days; unable to perform on bad days 8/25: 4-5/10 10/17: 40% of the time 12/15: not addressed this session 2/13: unable to due to extreme pain/fatigue  Time 12    Period Weeks    Status Partially Met    Target Date 08/22/21      PT LONG TERM GOAL #5   Title Patient will reduce timed up and go to <30 seconds to reduce fall risk and demonstrate improved transfer/gait ability.    Baseline 8/25: 1 min 47 seconds with rollator 10/17: 1 min 2 seconds 11/21: 59 seconds 12/15: deferred secondary to pain 2/20: 1 min 4 seconds with rollator and severe knee  buckling    Time 12    Period Weeks    Status Partially Met    Target Date 08/22/21      PT LONG TERM GOAL #6   Title Patient will report worst VAS of <4/10 in low back for return to PLOF and ability to perform mobility tasks.    Baseline 2/13: 10/10 2/20: 10/10    Time 12    Period Weeks    Status On-going    Target Date 08/22/21                   Plan - 06/09/21 1300     Clinical Impression Statement Patient will discuss PT needs with physician and pre-op appointment today. Will additionally find out when she will have hysterectomy to add pelvic to POC. Patient is highly motivated despite high pain levels. She is able to perform STS in 18 seconds which is improved despite her hip injuries. Patient reports decreased pain by end of session. Patient will continue to benefit from skilled PT to address gait deficits, standing and mobility tolerance, and strength to improve quality of life and level of independence with functional mobility and ADLs.    Personal Factors and Comorbidities Comorbidity 3+;Transportation;Time since onset of injury/illness/exacerbation;Finances    Comorbidities neurologic insult with pending diagnosis, multiple recurrent UTI's, BLE weakness, depression, hereditary spastic paraplegia    Examination-Activity Limitations Caring for Others;Carry;Continence;Squat;Stairs;Toileting;Locomotion Level;Lift;Stand;Bed Mobility;Bend;Reach Overhead;Sit;Dressing;Transfers    Examination-Participation Restrictions Church;Cleaning;Community Activity;Driving;Interpersonal Relationship;Laundry;[employer] Work;Meal Prep;School;Volunteer    Stability/Clinical Decision Making Unstable/Unpredictable    Rehab Potential Fair    PT Frequency 2x / week    PT Duration 12 weeks    PT Treatment/Interventions ADLs/Self Care Home Management;Aquatic Therapy;Biofeedback;Moist Heat;Electrical Stimulation;Cryotherapy;Traction;Ultrasound;Therapeutic activities;Functional mobility training;Stair  training;Gait training;Therapeutic exercise;Balance training;Neuromuscular re-education;Patient/family education;Manual techniques;Dry needling;Scar mobilization;Energy conservation;Taping;Joint Manipulations;Spinal Manipulations;Visual/perceptual remediation/compensation;Passive range of motion;Wheelchair mobility training;DME Instruction;Orthotic Fit/Training    PT Next Visit Plan transfers, stabilization in standing, core activation    PT Home Exercise Plan Added Quad sets;Access Code: [email or web address] URL: [email or web address]    Consulted and Agree with Plan of Care Patient             Patient will benefit from skilled therapeutic intervention in order to improve the following deficits and impairments:  Abnormal gait, Decreased balance, Decreased endurance, Decreased mobility, Difficulty walking, Increased muscle spasms, Impaired sensation, Improper body mechanics, Impaired tone, Decreased activity tolerance, Decreased coordination, Decreased strength, Postural dysfunction, Pain, Impaired flexibility  Visit Diagnosis: Other muscle spasm  Abnormality of gait and mobility  Muscle weakness (generalized)     Problem List Patient Active Problem List   Diagnosis Date Noted   Seizure ([place]) 11/11/2018   Major depressive disorder, recurrent episode, moderate ([place]) 02/07/2018   Nephrolithiasis 04/16/2016   Numbness 07/28/2015   Bladder retention 06/23/2015   Abdominal pain 06/04/2015   Dizziness 05/18/2015   Neck pain 96/22/2979   Complicated migraine 89/21/1941   Other fatigue 04/28/2015   Abnormal finding on MRI of brain 04/28/2015   D (diarrhea)  03/29/2015   H/O disease 03/29/2015   Abnormal weight loss 03/29/2015   Muscle weakness (generalized) 03/14/2015   Headache, migraine 03/10/2015   Janna Arch, PT, DPT  06/09/2021, 1:01 PM  [place] MAIN [facility] SERVICES [address] [place], [place], [address] Phone: [phone]    Fax:  [phone]  Name: Christine Chavez MRN: [id number] Date of Birth: 01-29-1981

## 2021-06-13 ENCOUNTER — Other Ambulatory Visit: Payer: Self-pay

## 2021-06-13 ENCOUNTER — Ambulatory Visit: Payer: BC Managed Care – PPO

## 2021-06-13 DIAGNOSIS — M6281 Muscle weakness (generalized): Secondary | ICD-10-CM

## 2021-06-13 DIAGNOSIS — M62838 Other muscle spasm: Secondary | ICD-10-CM

## 2021-06-13 DIAGNOSIS — R269 Unspecified abnormalities of gait and mobility: Secondary | ICD-10-CM

## 2021-06-13 NOTE — Therapy (Signed)
[place] °[facility] °[address] °[place], [place], [address] °Phone: [phone]   Fax:  [phone] ° °Physical Therapy Treatment ° °Patient Details  °Name: Christine Chavez °MRN: [id number] °Date of Birth: 07/04/1980 °Referring Provider (PT): Jessnie Jose Mathews ° ° °Encounter Date: 06/13/2021 ° ° PT End of Session - 06/13/21 1229   ° ° Visit Number [id number]   ° Number of Visits 172   ° Date for PT Re-Evaluation 08/22/21   ° Authorization Type 2/10 PN 2/20   ° Progress Note Due on Visit 150   ° PT Start Time 1016   ° PT Stop Time 1100   ° PT Time Calculation (min) 44 min   ° Activity Tolerance Patient tolerated treatment well;Patient limited by fatigue   ° Behavior During Therapy WFL for tasks assessed/performed   ° °  °  ° °  ° ° °Past Medical History:  °Diagnosis Date  ° Complication of anesthesia   ° ? seizures after anesthesia   ° Headache   ° Migraines   ° Neurogenic bladder   ° Renal disorder   ° Vision abnormalities   ° ° °Past Surgical History:  °Procedure Laterality Date  ° ANTERIOR CRUCIATE LIGAMENT REPAIR  1997  ° APPENDECTOMY    ° COLONOSCOPY WITH PROPOFOL N/A 11/11/2018  ° Procedure: COLONOSCOPY WITH PROPOFOL;  Surgeon: Skulskie, Martin U, MD;  Location: [facility] ENDOSCOPY;  Service: Endoscopy;  Laterality: N/A;  ° CYSTOSCOPY WITH STENT PLACEMENT Right 04/17/2016  ° Procedure: CYSTOSCOPY WITH STENT PLACEMENT;  Surgeon: Patrick L McKenzie, MD;  Location: [facility] ORS;  Service: Urology;  Laterality: Right;  ° ESOPHAGOGASTRODUODENOSCOPY (EGD) WITH PROPOFOL N/A 11/11/2018  ° Procedure: ESOPHAGOGASTRODUODENOSCOPY (EGD) WITH PROPOFOL;  Surgeon: Skulskie, Martin U, MD;  Location: [facility] ENDOSCOPY;  Service: Endoscopy;  Laterality: N/A;  ° EXPLORATORY LAPAROTOMY  1999  ° KIDNEY STONE SURGERY  04/2016  ° REVISION UROSTOMY CUTANEOUS    ° REVISION UROSTOMY CUTANEOUS  01/10/2018  ° SUPRAPUBIC CATHETER PLACEMENT  08/2017  ° TONSILLECTOMY    ° ° °There were no vitals filed for this  visit. ° ° Subjective Assessment - 06/13/21 1224   ° ° Subjective Patient saw gynecologist on Friday, does not have a date yet for hysterectomy. Will see specialist on April 14th for consult. Her hip surgery is next Tuesday. Had a fall since last session and twisted her ankle making it hard to stand on.   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 Due to the rarity of her disorder it took years prior to diagnosis. 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  Patient has good days and bad days. Her good days allow for short ambulation (within room only not between rooms) and her bad days she is unable to ambulate.   ° Limitations Lifting;Standing;Walking;House hold activities;Other (comment)   ° How long can you stand comfortably? limited on bad days   ° How long can you walk comfortably? within room only on good days, unable on bad days   ° Patient Stated Goals to regain PLOF   ° Currently in Pain? Yes   ° Pain Score 8    ° Pain Location Back   ° Pain Orientation Mid;Lower   ° Pain Descriptors /   Indicators Aching   ° Pain Type Chronic pain   ° Pain Onset 1 to 4 weeks ago   ° Multiple Pain Sites Yes   ° Pain Score 6   ° Pain Location Leg   ° Pain Orientation Left   ° Pain Descriptors / Indicators Aching   ° Pain Type Acute pain   ° °  °  ° °  ° ° ° °Patient saw gynecologist on Friday, does not have a date yet for hysterectomy. Will see specialist on April 14th for consult. Her hip surgery is next Tuesday. Had a fall since last session and twisted her ankle making it hard to stand on.  ° ° ° ° ° ° °Supine:  °Hamstring lengthening with leg on PT shoulder 60 seconds each LE; x2 trials °Popliteal angle 60 seconds each  LE x2 trials each LE °Single knee to chest 60 seconds each LE °Knee to cross body shoulder 60 seconds each LE °LE rotation for low back relief x 60 seconds x 4 trials each  °GTB abduction 20x  °GTB marches 20x each LE °posterior pelvic tilt 10x °  °sidelying: °Clamshells 15x each LE °Reverse clamshells 15x each LE °  °Prone: °STM with implementation of effleurage and pétrissage x 7 minutes for pain reduction of lumbar and thoracic pain.  °  °Pt educated throughout session about proper posture and technique with exercises. Improved exercise technique, movement at target joints, use of target muscles after min to mod verbal, visual, tactile cues. ° °Patient is unable to tolerate weightbearing interventions this session due to her injury s/p fall. Patient has surgery next Tuesday for her L hip. Will find out in April the date for her hysterectomy. Patient is aware that she will require a new order s/p surgery. Patient remains motivated despite the new medical changes upcoming. Patient will continue to benefit from skilled PT to address gait deficits, standing and mobility tolerance, and strength to improve quality of life and level of independence with functional mobility and ADLs. ° ° ° ° ° ° ° ° ° ° ° ° ° ° ° ° ° ° PT Education - 06/13/21 1228   ° ° Education Details need for order s/p surgery,   ° Methods Explanation   ° Comprehension Verbalized understanding   ° °  °  ° °  ° ° ° PT Short Term Goals - 12/09/20 1201   ° °  ° PT SHORT TERM GOAL #1  ° Title Patient will demonstrate coordinated diaphragmatic breathing with pelvic tilts to demonstrate improved control of diaphragm and TA, to allow for further strengthening of core musculature and decreased pelvic floor spasm.   ° Baseline Pt. demonstrates breathing dysfunction and poor PFM coordination evidenced by anal manometry As of 2/22: Pt. continues to have restriction in her diaphragm and near T/L junction but is able to intentionally use diaphragmatic breathing  through the available ROM.   ° Time 5   ° Period Weeks   ° Status Achieved   ° Target Date 04/20/19   °  ° PT SHORT TERM GOAL #2  ° Title Patient will report a reduction in pain to no greater than 6/10 over the prior week to demonstrate symptom improvement.   ° Baseline Pain is 10/10 at worst, 2/10 at best As of 2/22: Pt. had achieved this goal for 1 week as of 1/21 but may have infection or other insult that caused pain to increase again. As of 4/29: Pain high of 8/10 but   has been over-doing her activity over the past week. As of 6/10: Pt. was able to keep pain at 6 or below since prior visit by using TENS to help keep tension lower but has not had a full week to test if it will keep working. As of 7/6: Pt. had met this goal but has a new UTI and it caused pain to spike to 8/10 over the weekend. 8/25: 6/10 due to back pain   ° Time 5   ° Period Weeks   ° Status Achieved   ° Target Date 01/13/21   °  ° PT SHORT TERM GOAL #3  ° Title Patient will demonstrate HEP x1 in the clinic to demonstrate understanding and proper form to allow for further improvement.   ° Baseline Pt. lacks knowledge of therepeutic exercises that can decrease her pain/Sx.   ° Time 5   ° Period Weeks   ° Status Achieved   ° Target Date 04/20/19   °  ° PT SHORT TERM GOAL #4  ° Title Patient will report consistent use of foot-stool (squatty-potty) for positioning with BM to decrease pain with BM and intra-abdominal pressure.   ° Baseline Pt. having constipation due to PFM dysfunction   ° Time 5   ° Period Weeks   ° Status Achieved   ° Target Date 04/20/19   ° °  °  ° °  ° ° ° ° PT Long Term Goals - 06/06/21 0001   ° °  ° PT LONG TERM GOAL #1  ° Title Patient will be independent with manual wheelchair mobility in public location such as store for increased independence.   ° Baseline 11/21: unable to push herself around entire store yet 12/15: Patient reports being able to push herself around store independently but requires multiple rest breaks and  assistance with managing shopping basket/items. 2/13: able to self propel but has to take rest breaks   ° Time 12   ° Period Weeks   ° Status Achieved   °  ° PT LONG TERM GOAL #2  ° Title Patient will increase 10 meter walk test to <30 seconds as to improve gait speed for better community ambulation and to reduce fall risk.   ° Baseline 6/2: 1 min 18 seconds with rollator 8/25: deferred due to excessive knee buckling during TUG 10/17: 1 min 38 seconds with frequent knee buckling 11/21: 1 min 11 seconds with knee buckling with rollator 12/15: deferred secondary to pain 2/20: unable to do due to bilateral LE buckling   ° Time 12   ° Period Weeks   ° Status On-going   ° Target Date 08/22/21   °  ° PT LONG TERM GOAL #3  ° Title Patient (< 60 years old) will complete five times sit to stand test in < 20 seconds indicating an increased LE strength and improved balance.   ° Baseline 6/2: 43 seconds with heavy BUE support 8/25: 40 seconds with SUE support 10/17: 21 seconds with SUE support 11/21: 20 seconds with SUE support 12/15: deferred secondary to pain 2/20: bilateral knee buckling   ° Time 12   ° Period Weeks   ° Status Partially Met   ° Target Date 08/22/21   °  ° PT LONG TERM GOAL #4  ° Title Pt. will be able to perform 1 hr. of light housework reqiring intermittent standing and walking without increased pain/fatigue >2 pts.   ° Baseline Pt. becomes fatigued washing fruits/vegetbles in small stages   and has fatigue>3 pts. 3/14: able to perform but needs rest breaks 4/25: able to do but is fatiguing 6/2: able to perform on good days; unable to perform on bad days 8/25: 4-5/10 10/17: 40% of the time 12/15: not addressed this session 2/13: unable to due to extreme pain/fatigue    Time 12    Period Weeks    Status Partially Met    Target Date 08/22/21      PT LONG TERM GOAL #5   Title Patient will reduce timed up and go to <30 seconds to reduce fall risk and demonstrate improved transfer/gait ability.    Baseline  8/25: 1 min 47 seconds with rollator 10/17: 1 min 2 seconds 11/21: 59 seconds 12/15: deferred secondary to pain 2/20: 1 min 4 seconds with rollator and severe knee buckling    Time 12    Period Weeks    Status Partially Met    Target Date 08/22/21      PT LONG TERM GOAL #6   Title Patient will report worst VAS of <4/10 in low back for return to PLOF and ability to perform mobility tasks.    Baseline 2/13: 10/10 2/20: 10/10    Time 12    Period Weeks    Status On-going    Target Date 08/22/21                   Plan - 06/13/21 1248     Clinical Impression Statement Patient is unable to tolerate weightbearing interventions this session due to her injury s/p fall. Patient has surgery next Tuesday for her L hip. Will find out in April the date for her hysterectomy. Patient is aware that she will require a new order s/p surgery. Patient remains motivated despite the new medical changes upcoming. Patient will continue to benefit from skilled PT to address gait deficits, standing and mobility tolerance, and strength to improve quality of life and level of independence with functional mobility and ADLs.    Personal Factors and Comorbidities Comorbidity 3+;Transportation;Time since onset of injury/illness/exacerbation;Finances    Comorbidities neurologic insult with pending diagnosis, multiple recurrent UTI's, BLE weakness, depression, hereditary spastic paraplegia    Examination-Activity Limitations Caring for Others;Carry;Continence;Squat;Stairs;Toileting;Locomotion Level;Lift;Stand;Bed Mobility;Bend;Reach Overhead;Sit;Dressing;Transfers    Examination-Participation Restrictions Church;Cleaning;Community Activity;Driving;Interpersonal Relationship;Laundry;[employer] Work;Meal Prep;School;Volunteer    Stability/Clinical Decision Making Unstable/Unpredictable    Rehab Potential Fair    PT Frequency 2x / week    PT Duration 12 weeks    PT Treatment/Interventions ADLs/Self Care Home  Management;Aquatic Therapy;Biofeedback;Moist Heat;Electrical Stimulation;Cryotherapy;Traction;Ultrasound;Therapeutic activities;Functional mobility training;Stair training;Gait training;Therapeutic exercise;Balance training;Neuromuscular re-education;Patient/family education;Manual techniques;Dry needling;Scar mobilization;Energy conservation;Taping;Joint Manipulations;Spinal Manipulations;Visual/perceptual remediation/compensation;Passive range of motion;Wheelchair mobility training;DME Instruction;Orthotic Fit/Training    PT Next Visit Plan transfers, stabilization in standing, core activation    PT Home Exercise Plan Added Quad sets;Access Code: [email or web address] URL: [email or web address]    Consulted and Agree with Plan of Care Patient             Patient will benefit from skilled therapeutic intervention in order to improve the following deficits and impairments:  Abnormal gait, Decreased balance, Decreased endurance, Decreased mobility, Difficulty walking, Increased muscle spasms, Impaired sensation, Improper body mechanics, Impaired tone, Decreased activity tolerance, Decreased coordination, Decreased strength, Postural dysfunction, Pain, Impaired flexibility  Visit Diagnosis: Other muscle spasm  Abnormality of gait and mobility  Muscle weakness (generalized)     Problem List Patient Active Problem List   Diagnosis Date Noted   Seizure ([place]) 11/11/2018   Major depressive disorder, recurrent episode, moderate (  [facility]) 02/07/2018  ° Nephrolithiasis 04/16/2016  ° Numbness 07/28/2015  ° Bladder retention 06/23/2015  ° Abdominal pain 06/04/2015  ° Dizziness 05/18/2015  ° Neck pain 05/18/2015  ° Complicated migraine 04/28/2015  ° Other fatigue 04/28/2015  ° Abnormal finding on MRI of brain 04/28/2015  ° D (diarrhea) 03/29/2015  ° H/O disease 03/29/2015  ° Abnormal weight loss 03/29/2015  ° Muscle weakness (generalized) 03/14/2015  ° Headache, migraine 03/10/2015  ° ° ° , PT,  DPT ° °06/13/2021, 12:49 PM ° ° °[facility] °[address] °[place], [place], [address] °Phone: [phone]   Fax:  [phone] ° °Name: Christine Chavez °MRN: [id number] °Date of Birth: 12/19/1980 ° ° ° °

## 2021-06-16 ENCOUNTER — Ambulatory Visit: Payer: BC Managed Care – PPO

## 2021-06-20 ENCOUNTER — Ambulatory Visit: Payer: BC Managed Care – PPO

## 2021-06-23 ENCOUNTER — Ambulatory Visit: Payer: BC Managed Care – PPO

## 2021-06-27 ENCOUNTER — Ambulatory Visit: Payer: BC Managed Care – PPO

## 2021-06-30 ENCOUNTER — Other Ambulatory Visit: Payer: Self-pay

## 2021-06-30 ENCOUNTER — Ambulatory Visit: Payer: BC Managed Care – PPO | Attending: Gastroenterology

## 2021-06-30 DIAGNOSIS — M6281 Muscle weakness (generalized): Secondary | ICD-10-CM | POA: Insufficient documentation

## 2021-06-30 DIAGNOSIS — M62838 Other muscle spasm: Secondary | ICD-10-CM | POA: Insufficient documentation

## 2021-06-30 DIAGNOSIS — R269 Unspecified abnormalities of gait and mobility: Secondary | ICD-10-CM | POA: Insufficient documentation

## 2021-06-30 NOTE — Therapy (Signed)
[facility] ?[place], [place], [address] ?Phone: [phone]   Fax:  [phone] ? ?Physical Therapy Treatment ? ?Patient Details  ?Name: Christine Chavez ?MRN: [id number] ?Date of Birth: 1980-09-24 ?Referring Provider (PT): [facility] ? ? ?Encounter Date: 06/30/2021 ? ? PT End of Session - 06/30/21 1243   ? ? Visit Number [id number]   ? Number of Visits 172   ? Date for PT Re-Evaluation 08/22/21   ? Authorization Type 3/10 PN 2/20   ? Progress Note Due on Visit 150   ? PT Start Time 1104   ? PT Stop Time 1150   ? PT Time Calculation (min) 46 min   ? Activity Tolerance Patient tolerated treatment well;Patient limited by fatigue   ? Behavior During Therapy [facility] for tasks assessed/performed   ? ?  ?  ? ?  ? ? ?Past Medical History:  ?Diagnosis Date  ? Complication of anesthesia   ? ? seizures after anesthesia   ? Headache   ? Migraines   ? Neurogenic bladder   ? Renal disorder   ? Vision abnormalities   ? ? ?Past Surgical History:  ?Procedure Laterality Date  ? ANTERIOR CRUCIATE LIGAMENT REPAIR  1997  ? APPENDECTOMY    ? COLONOSCOPY WITH PROPOFOL N/A 11/11/2018  ? Procedure: COLONOSCOPY WITH PROPOFOL;  Surgeon: [name], MD;  Location: [facility] ENDOSCOPY;  Service: Endoscopy;  Laterality: N/A;  ? CYSTOSCOPY WITH STENT PLACEMENT Right 04/17/2016  ? Procedure: CYSTOSCOPY WITH STENT PLACEMENT;  Surgeon: [name], MD;  Location: [facility] ORS;  Service: Urology;  Laterality: Right;  ? ESOPHAGOGASTRODUODENOSCOPY (EGD) WITH PROPOFOL N/A 11/11/2018  ? Procedure: ESOPHAGOGASTRODUODENOSCOPY (EGD) WITH PROPOFOL;  Surgeon: [name], MD;  Location: [facility] ENDOSCOPY;  Service: Endoscopy;  Laterality: N/A;  ? [place]  ? KIDNEY STONE SURGERY  04/2016  ? REVISION UROSTOMY CUTANEOUS    ? REVISION UROSTOMY CUTANEOUS  01/10/2018  ? SUPRAPUBIC CATHETER PLACEMENT  08/2017  ? TONSILLECTOMY    ? ? ?There were no vitals filed for this  visit. ? ? Subjective Assessment - 06/30/21 1242   ? ? Subjective Patient surgery scheduled for later in april due to her having covid during pretest. Patient additionally has another UTI and been feeling very bad.   ? 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 Due to the rarity of her disorder it took years prior to diagnosis. 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  Patient has good days and bad days. Her good days allow for short ambulation (within room only not between rooms) and her bad days she is unable to ambulate.   ? Limitations Lifting;Standing;Walking;House hold activities;Other (comment)   ? How long can you stand comfortably? limited on bad days   ? How long can you walk comfortably? within room only on good days, unable on bad days   ? Patient Stated Goals to regain PLOF   ? Currently in Pain? Yes   ? Pain Score 7    ? Pain Location Back   ? Pain Orientation Mid;Lower   ? Pain Descriptors / Indicators Aching   ? Pain Type Chronic pain   ? Pain Onset 1 to 4 weeks  ago   ? Pain Frequency Intermittent   ? ?  ?  ? ?  ? ? ? ? ? ? ? ?  ?Supine:  ?Hamstring lengthening with leg on PT shoulder 60 seconds each LE; x2 trials ?Popliteal angle 60 seconds each LE x2 trials each LE ?Single knee to chest 60 seconds each LE ?Knee to cross body shoulder 60 seconds each LE ?LE rotation for low back relief x 60 seconds x 4 trials each  ?GTB abduction 20x  ?GTB marches 20x each LE ?posterior pelvic tilt 10x ?Bridge 15x ; patient very fatigued  ?  ?sidelying: ?STM with implementation of effleurage and p?trissage x 16 minutes for pain reduction of lumbar and thoracic pain.  ? ?Sitting: ?Sitting posture without back  support. X4 minutes  ?  ?Pt educated throughout session about proper posture and technique with exercises. Improved exercise technique, movement at target joints, use of target muscles after min to mod verbal, visual, tactile cues. ? ? ?Patient presents with significant back pain. Her surgery has been rescheduled due to her having COVID and then an additional UTI. Patient remains highly motivated despite her pain. Patient will continue to benefit from skilled PT to address gait deficits, standing and mobility tolerance, and strength to improve quality of life and level of independence with functional mobility and ADLs. ? ? ? ? ? ? ? ? ? ? ? ? ? ? ? ? ? ? PT Education - 06/30/21 1243   ? ? Education Details exercise technique, pain reduction   ? Person(s) Educated Patient   ? Methods Explanation;Demonstration;Tactile cues;Verbal cues   ? Comprehension Verbalized understanding;Returned demonstration;Verbal cues required;Tactile cues required   ? ?  ?  ? ?  ? ? ? PT Short Term Goals - 12/09/20 1201   ? ?  ? PT SHORT TERM GOAL #1  ? Title Patient will demonstrate coordinated diaphragmatic breathing with pelvic tilts to demonstrate improved control of diaphragm and TA, to allow for further strengthening of core musculature and decreased pelvic floor spasm.   ? Baseline Pt. demonstrates breathing dysfunction and poor PFM coordination evidenced by anal manometry As of 2/22: Pt. continues to have restriction in her diaphragm and near T/L junction but is able to intentionally use diaphragmatic breathing through the available ROM.   ? Time 5   ? Period Weeks   ? Status Achieved   ? Target Date 04/20/19   ?  ? PT SHORT TERM GOAL #2  ? Title Patient will report a reduction in pain to no greater than 6/10 over the prior week to demonstrate symptom improvement.   ? Baseline Pain is 10/10 at worst, 2/10 at best As of 2/22: Pt. had achieved this goal for 1 week as of 1/21 but may have infection or other insult that caused pain to  increase again. As of 4/29: Pain high of 8/10 but has been over-doing her activity over the past week. As of 6/10: Pt. was able to keep pain at 6 or below since prior visit by using TENS to help keep tension lower but has not had a full week to test if it will keep working. As of 7/6: Pt. had met this goal but has a new UTI and it caused pain to spike to 8/10 over the weekend. 8/25: 6/10 due to back pain   ? Time 5   ? Period Weeks   ? Status Achieved   ? Target Date 01/13/21   ?  ? PT SHORT  TERM GOAL #3  ? Title Patient will demonstrate HEP x1 in the clinic to demonstrate understanding and proper form to allow for further improvement.   ? Baseline Pt. lacks knowledge of therepeutic exercises that can decrease her pain/Sx.   ? Time 5   ? Period Weeks   ? Status Achieved   ? Target Date 04/20/19   ?  ? PT SHORT TERM GOAL #4  ? Title Patient will report consistent use of foot-stool (squatty-potty) for positioning with BM to decrease pain with BM and intra-abdominal pressure.   ? Baseline Pt. having constipation due to PFM dysfunction   ? Time 5   ? Period Weeks   ? Status Achieved   ? Target Date 04/20/19   ? ?  ?  ? ?  ? ? ? ? PT Long Term Goals - 06/06/21 0001   ? ?  ? PT LONG TERM GOAL #1  ? Title Patient will be independent with manual wheelchair mobility in public location such as store for increased independence.   ? Baseline 11/21: unable to push herself around entire store yet 12/15: Patient reports being able to push herself around store independently but requires multiple rest breaks and assistance with managing shopping basket/items. 2/13: able to self propel but has to take rest breaks   ? Time 12   ? Period Weeks   ? Status Achieved   ?  ? PT LONG TERM GOAL #2  ? Title Patient will increase 10 meter walk test to <30 seconds as to improve gait speed for better community ambulation and to reduce fall risk.   ? Baseline 6/2: 1 min 18 seconds with rollator 8/25: deferred due to excessive knee buckling during  TUG 10/17: 1 min 38 seconds with frequent knee buckling 11/21: 1 min 11 seconds with knee buckling with rollator 12/15: deferred secondary to pain 2/20: unable to do due to bilateral LE buckling   ? Time 1

## 2021-07-04 ENCOUNTER — Other Ambulatory Visit: Payer: Self-pay

## 2021-07-04 ENCOUNTER — Ambulatory Visit: Payer: BC Managed Care – PPO

## 2021-07-04 DIAGNOSIS — M62838 Other muscle spasm: Secondary | ICD-10-CM | POA: Diagnosis not present

## 2021-07-04 DIAGNOSIS — M6281 Muscle weakness (generalized): Secondary | ICD-10-CM

## 2021-07-04 DIAGNOSIS — R269 Unspecified abnormalities of gait and mobility: Secondary | ICD-10-CM

## 2021-07-04 NOTE — Therapy (Signed)
[place] [facility] ?[place], [place], [address] ?Phone: [phone]   Fax:  [phone] ? ?Physical Therapy Treatment ? ?Patient Details  ?Name: Christine Chavez ?MRN: [id number] ?Date of Birth: 28-May-1980 ?Referring Provider (PT): [facility] ? ? ?Encounter Date: 07/04/2021 ? ? PT End of Session - 07/04/21 1058   ? ? Visit Number [id number]   ? Number of Visits 172   ? Date for PT Re-Evaluation 08/22/21   ? Authorization Type 4/10 PN 2/20   ? Progress Note Due on Visit 150   ? PT Start Time 1102   ? PT Stop Time 1146   ? PT Time Calculation (min) 44 min   ? Activity Tolerance Patient tolerated treatment well;Patient limited by fatigue   ? Behavior During Therapy [facility] for tasks assessed/performed   ? ?  ?  ? ?  ? ? ?Past Medical History:  ?Diagnosis Date  ? Complication of anesthesia   ? ? seizures after anesthesia   ? Headache   ? Migraines   ? Neurogenic bladder   ? Renal disorder   ? Vision abnormalities   ? ? ?Past Surgical History:  ?Procedure Laterality Date  ? ANTERIOR CRUCIATE LIGAMENT REPAIR  1997  ? APPENDECTOMY    ? COLONOSCOPY WITH PROPOFOL N/A 11/11/2018  ? Procedure: COLONOSCOPY WITH PROPOFOL;  Surgeon: [name], MD;  Location: [facility] ENDOSCOPY;  Service: Endoscopy;  Laterality: N/A;  ? CYSTOSCOPY WITH STENT PLACEMENT Right 04/17/2016  ? Procedure: CYSTOSCOPY WITH STENT PLACEMENT;  Surgeon: [name], MD;  Location: [facility] ORS;  Service: Urology;  Laterality: Right;  ? ESOPHAGOGASTRODUODENOSCOPY (EGD) WITH PROPOFOL N/A 11/11/2018  ? Procedure: ESOPHAGOGASTRODUODENOSCOPY (EGD) WITH PROPOFOL;  Surgeon: [name], MD;  Location: [facility] ENDOSCOPY;  Service: Endoscopy;  Laterality: N/A;  ? [place]  ? KIDNEY STONE SURGERY  04/2016  ? REVISION UROSTOMY CUTANEOUS    ? REVISION UROSTOMY CUTANEOUS  01/10/2018  ? SUPRAPUBIC CATHETER PLACEMENT  08/2017  ? TONSILLECTOMY    ? ? ?There were no vitals filed for this  visit. ? ? Subjective Assessment - 07/04/21 1232   ? ? Subjective Patient reports her left hip has been bothering her more. No falls or LOB since last session but is having more difficulty in standing.   ? 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 Due to the rarity of her disorder it took years prior to diagnosis. 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  Patient has good days and bad days. Her good days allow for short ambulation (within room only not between rooms) and her bad days she is unable to ambulate.   ? Limitations Lifting;Standing;Walking;House hold activities;Other (comment)   ? How long can you stand comfortably? limited on bad days   ? How long can you walk comfortably? within room only on good days, unable on bad days   ? Patient Stated Goals to regain PLOF   ? Currently in Pain? Yes   ? Pain Score 7    ? Pain Location Back   ? Pain Orientation Mid;Lower   ? Pain Descriptors / Indicators Aching   ? Pain Type Chronic pain   ? Pain Onset 1 to 4 weeks  ago   ? Pain Frequency Intermittent   ? Pain Score 7   ? Pain Location Hip   ? Pain Orientation Left;Right   ? Pain Descriptors / Indicators Aching   ? Pain Type Chronic pain   ? Pain Onset More than a month ago   ? Pain Frequency Intermittent   ? Aggravating Factors  weightbearing   ? ?  ?  ? ?  ? ? ? ? ? ? ? ? ?  ?Supine:  ?Hamstring lengthening with leg on PT shoulder 60 seconds each LE; x2 trials ?Popliteal angle 60 seconds each LE x2 trials each LE ?Single knee to chest 60 seconds each LE ?Knee to cross body shoulder 60 seconds each LE ?LE rotation for low back relief x 60 seconds x 4 trials each  ?GTB abduction 20x  ?GTB marches 20x each  LE ?posterior pelvic tilt 10x ?  ?sidelying: ?Clamshells 15x each LE ?Reverse clamshells 15x each LE ?  ?Prone: ?STM with implementation of effleurage and p?trissage x 7 minutes for pain reduction of lumbar and thoracic pain.  ? ?Sit to stand 60 second holds with BUE support ?  ?Pt educated throughout session about proper posture and technique with exercises. Improved exercise technique, movement at target joints, use of target muscles after min to mod verbal, visual, tactile cues. ? ? ? ?Patient tolerated standing interventions for two sets prior to L hip buckling requiring stabilization to remain upright. Patient reports decreased pain by end of session. She is highly motivated despite her fatigue and pain. Patient will continue to benefit from skilled PT to address gait deficits, standing and mobility tolerance, and strength to improve quality of life and level of independence with functional mobility and ADLs. ? ? ? ? ? ? ? ? ? ? ? ? ? ? ? ? PT Education - 07/04/21 1057   ? ? Education Details exercise technique, pain reduction   ? Person(s) Educated Patient   ? Methods Explanation;Demonstration;Tactile cues;Verbal cues   ? Comprehension Verbalized understanding;Returned demonstration;Verbal cues required;Tactile cues required   ? ?  ?  ? ?  ? ? ? PT Short Term Goals - 12/09/20 1201   ? ?  ? PT SHORT TERM GOAL #1  ? Title Patient will demonstrate coordinated diaphragmatic breathing with pelvic tilts to demonstrate improved control of diaphragm and TA, to allow for further strengthening of core musculature and decreased pelvic floor spasm.   ? Baseline Pt. demonstrates breathing dysfunction and poor PFM coordination evidenced by anal manometry As of 2/22: Pt. continues to have restriction in her diaphragm and near T/L junction but is able to intentionally use diaphragmatic breathing through the available ROM.   ? Time 5   ? Period Weeks   ? Status Achieved   ? Target Date 04/20/19   ?  ? PT SHORT TERM GOAL #2  ?  Title Patient will report a reduction in pain to no greater than 6/10 over the prior week to demonstrate symptom improvement.   ? Baseline Pain is 10/10 at worst, 2/10 at best As of 2/22: Pt. had achieved this goal for 1 week as of 1/21 but may have infection or other insult that caused pain to increase again. As of 4/29: Pain high of 8/10 but has been over-doing her activity over the past week. As of 6/10: Pt. was able to keep pain at 6 or below since prior visit by using TENS to help keep tension lower but has not had a  full week to test if it will keep working. As of 7/6: Pt. had met this goal but has a new UTI and it caused pain to spike to 8/10 over the weekend. 8/25: 6/10 due to back pain   ? Time 5   ? Period Weeks   ? Status Achieved   ? Target Date 01/13/21   ?  ? PT SHORT TERM GOAL #3  ? Title Patient will demonstrate HEP x1 in the clinic to demonstrate understanding and proper form to allow for further improvement.   ? Baseline Pt. lacks knowledge of therepeutic exercises that can decrease her pain/Sx.   ? Time 5   ? Period Weeks   ? Status Achieved   ? Target Date 04/20/19   ?  ? PT SHORT TERM GOAL #4  ? Title Patient will report consistent use of foot-stool (squatty-potty) for positioning with BM to decrease pain with BM and intra-abdominal pressure.   ? Baseline Pt. having constipation due to PFM dysfunction   ? Time 5   ? Period Weeks   ? Status Achieved   ? Target Date 04/20/19   ? ?  ?  ? ?  ? ? ? ? PT Long Term Goals - 06/06/21 0001   ? ?  ? PT LONG TERM GOAL #1  ? Title Patient will be independent with manual wheelchair mobility in public location such as store for increased independence.   ? Baseline 11/21: unable to push herself around entire store yet 12/15: Patient reports being able to push herself around store independently but requires multiple rest breaks and assistance with managing shopping basket/items. 2/13: able to self propel but has to take rest breaks   ? Time 12   ? Period Weeks    ? Status Achieved   ?  ? PT LONG TERM GOAL #2  ? Title Patient will increase 10 meter walk test to <30 seconds as to improve gait speed for better community ambulation and to reduce fall risk.   ? Baseline 6

## 2021-07-07 ENCOUNTER — Ambulatory Visit: Payer: BC Managed Care – PPO

## 2021-07-11 ENCOUNTER — Ambulatory Visit: Payer: BC Managed Care – PPO

## 2021-07-14 ENCOUNTER — Ambulatory Visit: Payer: BC Managed Care – PPO

## 2021-07-14 DIAGNOSIS — M62838 Other muscle spasm: Secondary | ICD-10-CM | POA: Diagnosis not present

## 2021-07-14 DIAGNOSIS — M6281 Muscle weakness (generalized): Secondary | ICD-10-CM

## 2021-07-14 DIAGNOSIS — R269 Unspecified abnormalities of gait and mobility: Secondary | ICD-10-CM

## 2021-07-14 NOTE — Therapy (Signed)
[place] [facility] ?[place], [place], [address] ?Phone: [phone]   Fax:  [phone] ? ?Physical Therapy Treatment ? ?Patient Details  ?Name: Christine Chavez ?MRN: [id number] ?Date of Birth: 1981/04/12 ?Referring Provider (PT): [facility] ? ? ?Encounter Date: 07/14/2021 ? ? PT End of Session - 07/14/21 1258   ? ? Visit Number 155   ? Number of Visits 172   ? Date for PT Re-Evaluation 08/22/21   ? Authorization Type 5/10 PN 2/20   ? Progress Note Due on Visit 150   ? PT Start Time 1100   ? PT Stop Time 1146   ? PT Time Calculation (min) 46 min   ? Activity Tolerance Patient tolerated treatment well;Patient limited by fatigue   ? Behavior During Therapy [facility] for tasks assessed/performed   ? ?  ?  ? ?  ? ? ?Past Medical History:  ?Diagnosis Date  ? Complication of anesthesia   ? ? seizures after anesthesia   ? Headache   ? Migraines   ? Neurogenic bladder   ? Renal disorder   ? Vision abnormalities   ? ? ?Past Surgical History:  ?Procedure Laterality Date  ? ANTERIOR CRUCIATE LIGAMENT REPAIR  1997  ? APPENDECTOMY    ? COLONOSCOPY WITH PROPOFOL N/A 11/11/2018  ? Procedure: COLONOSCOPY WITH PROPOFOL;  Surgeon: [name], MD;  Location: [facility] ENDOSCOPY;  Service: Endoscopy;  Laterality: N/A;  ? CYSTOSCOPY WITH STENT PLACEMENT Right 04/17/2016  ? Procedure: CYSTOSCOPY WITH STENT PLACEMENT;  Surgeon: [name], MD;  Location: [facility] ORS;  Service: Urology;  Laterality: Right;  ? ESOPHAGOGASTRODUODENOSCOPY (EGD) WITH PROPOFOL N/A 11/11/2018  ? Procedure: ESOPHAGOGASTRODUODENOSCOPY (EGD) WITH PROPOFOL;  Surgeon: [name], MD;  Location: [facility] ENDOSCOPY;  Service: Endoscopy;  Laterality: N/A;  ? [place]  ? KIDNEY STONE SURGERY  04/2016  ? REVISION UROSTOMY CUTANEOUS    ? REVISION UROSTOMY CUTANEOUS  01/10/2018  ? SUPRAPUBIC CATHETER PLACEMENT  08/2017  ? TONSILLECTOMY    ? ? ?There were no vitals filed for this  visit. ? ? Subjective Assessment - 07/14/21 1255   ? ? Subjective Patient missed last session due to severe migraine. She will be having her pre-op appointments in two weeks.   ? 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 Due to the rarity of her disorder it took years prior to diagnosis. 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  Patient has good days and bad days. Her good days allow for short ambulation (within room only not between rooms) and her bad days she is unable to ambulate.   ? Limitations Lifting;Standing;Walking;House hold activities;Other (comment)   ? How long can you stand comfortably? limited on bad days   ? How long can you walk comfortably? within room only on good days, unable on bad days   ? Patient Stated Goals to regain PLOF   ? Currently in Pain? Yes   ? Pain Score 7    ? Pain Location Back   ? Pain Orientation Mid;Lower   ? Pain Descriptors / Indicators Aching   ? Pain Type Chronic pain   ? Pain Onset 1 to 4 weeks ago   ? Pain Frequency  Intermittent   ? ?  ?  ? ?  ? ? ? ? ?Supine:  ?Hamstring lengthening with leg on PT shoulder 60 seconds each LE; x2 trials ?Popliteal angle 60 seconds each LE x2 trials each LE ?Single knee to chest 60 seconds each LE ?Knee to cross body shoulder 60 seconds each LE ?LE rotation for low back relief x 60 seconds x 4 trials each  ?GTB abduction 20x  ?GTB marches 20x each LE ?posterior pelvic tilt 10x ? ?  ?Prone: ?STM with implementation of effleurage and p?trissage x 12 minutes for pain reduction of lumbar and thoracic pain. use of metal tool for iASTM  ?Grade II-III UPA and CPA lumbar mobilizations x 6 minutes ?hip flexor lengthening stretch 60 seconds each  LE ? ?Sit to stand 60 second holds with BUE support x4 trials ?hamstring curl 15x each LE ? ?Pt educated throughout session about proper posture and technique with exercises. Improved exercise technique, movement at target joints, use of target muscles after min to mod verbal, visual, tactile cues ? ? ? ?Patient presents with excellent motivation throughout physical therapy session. Her pain is high upon starting session but is reduced by end of session. Patient is able to tolerate four standing posture sit to stands with prolonged standing holds prior to legs giving out and requiring assistance to sit back down. Patient will continue to benefit from skilled PT to address gait deficits, standing and mobility tolerance, and strength to improve quality of life and level of independence with functional mobility and ADLs. ? ? ? ? ? ? ? ? ? ? ? ? ? ? ? ? ? ? ? ? PT Education - 07/14/21 1258   ? ? Education Details exercise technique, body mechanics   ? Person(s) Educated Patient   ? Methods Explanation;Demonstration;Tactile cues;Verbal cues   ? Comprehension Verbalized understanding;Returned demonstration;Verbal cues required;Tactile cues required   ? ?  ?  ? ?  ? ? ? PT Short Term Goals - 12/09/20 1201   ? ?  ? PT SHORT TERM GOAL #1  ? Title Patient will demonstrate coordinated diaphragmatic breathing with pelvic tilts to demonstrate improved control of diaphragm and TA, to allow for further strengthening of core musculature and decreased pelvic floor spasm.   ? Baseline Pt. demonstrates breathing dysfunction and poor PFM coordination evidenced by anal manometry As of 2/22: Pt. continues to have restriction in her diaphragm and near T/L junction but is able to intentionally use diaphragmatic breathing through the available ROM.   ? Time 5   ? Period Weeks   ? Status Achieved   ? Target Date 04/20/19   ?  ? PT SHORT TERM GOAL #2  ? Title Patient will report a reduction in pain to no greater than 6/10 over the prior week  to demonstrate symptom improvement.   ? Baseline Pain is 10/10 at worst, 2/10 at best As of 2/22: Pt. had achieved this goal for 1 week as of 1/21 but may have infection or other insult that caused pain to increase again. As of 4/29: Pain high of 8/10 but has been over-doing her activity over the past week. As of 6/10: Pt. was able to keep pain at 6 or below since prior visit by using TENS to help keep tension lower but has not had a full week to test if it will keep working. As of 7/6: Pt. had met this goal but has a new UTI and it caused pain to spike to  8/10 over the weekend. 8/25: 6/10 due to back pain   ? Time 5   ? Period Weeks   ? Status Achieved   ? Target Date 01/13/21   ?  ? PT SHORT TERM GOAL #3  ? Title Patient will demonstrate HEP x1 in the clinic to demonstrate understanding and proper form to allow for further improvement.   ? Baseline Pt. lacks knowledge of therepeutic exercises that can decrease her pain/Sx.   ? Time 5   ? Period Weeks   ? Status Achieved   ? Target Date 04/20/19   ?  ? PT SHORT TERM GOAL #4  ? Title Patient will report consistent use of foot-stool (squatty-potty) for positioning with BM to decrease pain with BM and intra-abdominal pressure.   ? Baseline Pt. having constipation due to PFM dysfunction   ? Time 5   ? Period Weeks   ? Status Achieved   ? Target Date 04/20/19   ? ?  ?  ? ?  ? ? ? ? PT Long Term Goals - 06/06/21 0001   ? ?  ? PT LONG TERM GOAL #1  ? Title Patient will be independent with manual wheelchair mobility in public location such as store for increased independence.   ? Baseline 11/21: unable to push herself around entire store yet 12/15: Patient reports being able to push herself around store independently but requires multiple rest breaks and assistance with managing shopping basket/items. 2/13: able to self propel but has to take rest breaks   ? Time 12   ? Period Weeks   ? Status Achieved   ?  ? PT LONG TERM GOAL #2  ? Title Patient will increase 10 meter  walk test to <30 seconds as to improve gait speed for better community ambulation and to reduce fall risk.   ? Baseline 6/2: 1 min 18 seconds with rollator 8/25: deferred due to excessive knee buckling

## 2021-07-18 ENCOUNTER — Ambulatory Visit: Payer: BC Managed Care – PPO | Attending: Gastroenterology

## 2021-07-18 DIAGNOSIS — R269 Unspecified abnormalities of gait and mobility: Secondary | ICD-10-CM | POA: Diagnosis present

## 2021-07-18 DIAGNOSIS — M62838 Other muscle spasm: Secondary | ICD-10-CM | POA: Insufficient documentation

## 2021-07-18 DIAGNOSIS — M6281 Muscle weakness (generalized): Secondary | ICD-10-CM | POA: Insufficient documentation

## 2021-07-18 DIAGNOSIS — R2689 Other abnormalities of gait and mobility: Secondary | ICD-10-CM | POA: Insufficient documentation

## 2021-07-18 DIAGNOSIS — R262 Difficulty in walking, not elsewhere classified: Secondary | ICD-10-CM | POA: Insufficient documentation

## 2021-07-18 NOTE — Therapy (Signed)
[facility] ?[place], [place], [address] ?Phone: [phone]   Fax:  [phone] ? ?Physical Therapy Treatment ? ?Patient Details  ?Name: Christine Chavez ?MRN: [id number] ?Date of Birth: 1980/12/31 ?Referring Provider (PT): [facility] ? ? ?Encounter Date: 07/18/2021 ? ? PT End of Session - 07/18/21 1104   ? ? Visit Number [id number]   ? Number of Visits 172   ? Date for PT Re-Evaluation 08/22/21   ? Authorization Type 6/10 PN 2/20   ? Progress Note Due on Visit 150   ? PT Start Time 1100   ? PT Stop Time 1144   ? PT Time Calculation (min) 44 min   ? Activity Tolerance Patient tolerated treatment well;Patient limited by fatigue   ? Behavior During Therapy [facility] for tasks assessed/performed   ? ?  ?  ? ?  ? ? ?Past Medical History:  ?Diagnosis Date  ? Complication of anesthesia   ? ? seizures after anesthesia   ? Headache   ? Migraines   ? Neurogenic bladder   ? Renal disorder   ? Vision abnormalities   ? ? ?Past Surgical History:  ?Procedure Laterality Date  ? ANTERIOR CRUCIATE LIGAMENT REPAIR  1997  ? APPENDECTOMY    ? COLONOSCOPY WITH PROPOFOL N/A 11/11/2018  ? Procedure: COLONOSCOPY WITH PROPOFOL;  Surgeon: [name], MD;  Location: [facility] ENDOSCOPY;  Service: Endoscopy;  Laterality: N/A;  ? CYSTOSCOPY WITH STENT PLACEMENT Right 04/17/2016  ? Procedure: CYSTOSCOPY WITH STENT PLACEMENT;  Surgeon: [name], MD;  Location: [facility] ORS;  Service: Urology;  Laterality: Right;  ? ESOPHAGOGASTRODUODENOSCOPY (EGD) WITH PROPOFOL N/A 11/11/2018  ? Procedure: ESOPHAGOGASTRODUODENOSCOPY (EGD) WITH PROPOFOL;  Surgeon: [name], MD;  Location: [facility] ENDOSCOPY;  Service: Endoscopy;  Laterality: N/A;  ? [facility]  ? KIDNEY STONE SURGERY  04/2016  ? REVISION UROSTOMY CUTANEOUS    ? REVISION UROSTOMY CUTANEOUS  01/10/2018  ? SUPRAPUBIC CATHETER PLACEMENT  08/2017  ? TONSILLECTOMY    ? ? ?There were no vitals filed for this  visit. ? ? Subjective Assessment - 07/18/21 1103   ? ? Subjective Patient reports her bladder has started being irritated again. Continues to have back pain.   ? 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 Due to the rarity of her disorder it took years prior to diagnosis. 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  Patient has good days and bad days. Her good days allow for short ambulation (within room only not between rooms) and her bad days she is unable to ambulate.   ? Limitations Lifting;Standing;Walking;House hold activities;Other (comment)   ? How long can you stand comfortably? limited on bad days   ? How long can you walk comfortably? within room only on good days, unable on bad days   ? Patient Stated Goals to regain PLOF   ? Currently in Pain? Yes   ? Pain Score 7    ? Pain Location Back   ? Pain Orientation Mid;Lower   ? Pain Descriptors / Indicators Aching   ? Pain Type Chronic pain   ? Pain Onset 1 to 4 weeks ago   ? Pain Frequency Intermittent   ? ?  ?  ? ?  ? ? ? ? ? ? ? ? ? ? ?  ?  Supine:  ?Hamstring lengthening with leg on PT shoulder 60 seconds each LE; x2 trials ?Popliteal angle 60 seconds each LE x2 trials each LE ?Single knee to chest 60 seconds each LE ?Knee to cross body shoulder 60 seconds each LE ?LE rotation for low back relief x 60 seconds x 4 trials each  ?GTB abduction 20x  ?GTB marches 20x each LE ?posterior pelvic tilt 10x ? adduction ball squeeze 15x 3 second holds ?  ?Prone: ?STM with implementation of effleurage and p?trissage x 12 minutes for pain reduction of lumbar and thoracic pain. use of metal tool for iASTM  ?Grade II-III UPA and CPA lumbar mobilizations x 6 minutes ?hip  flexor lengthening stretch 60 seconds each LE ?  ?Sit to stand 60 second holds with BUE support x4 trials ?hamstring curl 15x each LE ?  ?Pt educated throughout session about proper posture and technique with exercises. Improved exercise technique, movement at target joints, use of target muscles after min to mod verbal, visual, tactile cues ?  ? ?Patient has significant fatigue during session but remains highly motivated. She does have decreased tone throughout session but is present upon fatigue. Patient has knee buckling in standing requiring stabilization to prevent LOB. Patient will continue to benefit from skilled PT to address gait deficits, standing and mobility tolerance, and strength to improve quality of life and level of independence with functional mobility and ADLs. ? ? ? ? ? ? ? ? ? ? ? ? ? ? ? ? PT Education - 07/18/21 1104   ? ? Education Details exercise technique, body mechanics   ? Person(s) Educated Patient   ? Methods Explanation;Demonstration;Tactile cues;Verbal cues   ? Comprehension Verbalized understanding;Returned demonstration;Verbal cues required;Tactile cues required   ? ?  ?  ? ?  ? ? ? PT Short Term Goals - 12/09/20 1201   ? ?  ? PT SHORT TERM GOAL #1  ? Title Patient will demonstrate coordinated diaphragmatic breathing with pelvic tilts to demonstrate improved control of diaphragm and TA, to allow for further strengthening of core musculature and decreased pelvic floor spasm.   ? Baseline Pt. demonstrates breathing dysfunction and poor PFM coordination evidenced by anal manometry As of 2/22: Pt. continues to have restriction in her diaphragm and near T/L junction but is able to intentionally use diaphragmatic breathing through the available ROM.   ? Time 5   ? Period Weeks   ? Status Achieved   ? Target Date 04/20/19   ?  ? PT SHORT TERM GOAL #2  ? Title Patient will report a reduction in pain to no greater than 6/10 over the prior week to demonstrate symptom improvement.   ?  Baseline Pain is 10/10 at worst, 2/10 at best As of 2/22: Pt. had achieved this goal for 1 week as of 1/21 but may have infection or other insult that caused pain to increase again. As of 4/29: Pain high of 8/10 but has been over-doing her activity over the past week. As of 6/10: Pt. was able to keep pain at 6 or below since prior visit by using TENS to help keep tension lower but has not had a full week to test if it will keep working. As of 7/6: Pt. had met this goal but has a new UTI and it caused pain to spike to 8/10 over the weekend. 8/25: 6/10 due to back pain   ? Time 5   ? Period Weeks   ? Status Achieved   ?  Target Date 01/13/21   ?  ? PT SHORT TERM GOAL #3  ? Title Patient will demonstrate HEP x1 in the clinic to demonstrate understanding and proper form to allow for further improvement.   ? Baseline Pt. lacks knowledge of therepeutic exercises that can decrease her pain/Sx.   ? Time 5   ? Period Weeks   ? Status Achieved   ? Target Date 04/20/19   ?  ? PT SHORT TERM GOAL #4  ? Title Patient will report consistent use of foot-stool (squatty-potty) for positioning with BM to decrease pain with BM and intra-abdominal pressure.   ? Baseline Pt. having constipation due to PFM dysfunction   ? Time 5   ? Period Weeks   ? Status Achieved   ? Target Date 04/20/19   ? ?  ?  ? ?  ? ? ? ? PT Long Term Goals - 06/06/21 0001   ? ?  ? PT LONG TERM GOAL #1  ? Title Patient will be independent with manual wheelchair mobility in public location such as store for increased independence.   ? Baseline 11/21: unable to push herself around entire store yet 12/15: Patient reports being able to push herself around store independently but requires multiple rest breaks and assistance with managing shopping basket/items. 2/13: able to self propel but has to take rest breaks   ? Time 12   ? Period Weeks   ? Status Achieved   ?  ? PT LONG TERM GOAL #2  ? Title Patient will increase 10 meter walk test to <30 seconds as to improve gait  speed for better community ambulation and to reduce fall risk.   ? Baseline 6/2: 1 min 18 seconds with rollator 8/25: deferred due to excessive knee buckling during TUG 10/17: 1 min 38 seconds with frequent kn

## 2021-07-21 ENCOUNTER — Ambulatory Visit: Payer: BC Managed Care – PPO

## 2021-07-21 DIAGNOSIS — M62838 Other muscle spasm: Secondary | ICD-10-CM

## 2021-07-21 DIAGNOSIS — R269 Unspecified abnormalities of gait and mobility: Secondary | ICD-10-CM

## 2021-07-21 DIAGNOSIS — M6281 Muscle weakness (generalized): Secondary | ICD-10-CM

## 2021-07-21 NOTE — Therapy (Signed)
[facility] ?[place], [place], [address] ?Phone: [phone]   Fax:  [phone] ? ?Physical Therapy Treatment ? ?Patient Details  ?Name: Christine Chavez ?MRN: [id number] ?Date of Birth: 03-15-1981 ?Referring Provider (PT): [facility] ? ? ?Encounter Date: 07/21/2021 ? ? PT End of Session - 07/21/21 1221   ? ? Visit Number [id number]   ? Number of Visits 172   ? Date for PT Re-Evaluation 08/22/21   ? Authorization Type 7/10 PN 2/20   ? Progress Note Due on Visit 150   ? PT Start Time 1100   ? PT Stop Time 1145   ? PT Time Calculation (min) 45 min   ? Activity Tolerance Patient tolerated treatment well;Patient limited by fatigue   ? Behavior During Therapy [facility] for tasks assessed/performed   ? ?  ?  ? ?  ? ? ?Past Medical History:  ?Diagnosis Date  ? Complication of anesthesia   ? ? seizures after anesthesia   ? Headache   ? Migraines   ? Neurogenic bladder   ? Renal disorder   ? Vision abnormalities   ? ? ?Past Surgical History:  ?Procedure Laterality Date  ? ANTERIOR CRUCIATE LIGAMENT REPAIR  1997  ? APPENDECTOMY    ? COLONOSCOPY WITH PROPOFOL N/A 11/11/2018  ? Procedure: COLONOSCOPY WITH PROPOFOL;  Surgeon: [name], MD;  Location[facility] ENDOSCOPY;  Service: Endoscopy;  Laterality: N/A;  ? CYSTOSCOPY WITH STENT PLACEMENT Right 04/17/2016  ? Procedure: CYSTOSCOPY WITH STENT PLACEMENT;  Surgeon: [name], MD;  Location: [facility] ORS;  Service: Urology;  Laterality: Right;  ? ESOPHAGOGASTRODUODENOSCOPY (EGD) WITH PROPOFOL N/A 11/11/2018  ? Procedure: ESOPHAGOGASTRODUODENOSCOPY (EGD) WITH PROPOFOL;  Surgeon: [name], MD;  Location: [facility] ENDOSCOPY;  Service: Endoscopy;  Laterality: N/A;  ? [place]  ? KIDNEY STONE SURGERY  04/2016  ? REVISION UROSTOMY CUTANEOUS    ? REVISION UROSTOMY CUTANEOUS  01/10/2018  ? SUPRAPUBIC CATHETER PLACEMENT  08/2017  ? TONSILLECTOMY    ? ? ?There were no vitals filed for this  visit. ? ? Subjective Assessment - 07/21/21 1151   ? ? Subjective Patient reports her back pain has been limiting her sleep. Is trying to get ready for her upcoming surgery.   ? 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 Due to the rarity of her disorder it took years prior to diagnosis. 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  Patient has good days and bad days. Her good days allow for short ambulation (within room only not between rooms) and her bad days she is unable to ambulate.   ? Limitations Lifting;Standing;Walking;House hold activities;Other (comment)   ? How long can you stand comfortably? limited on bad days   ? How long can you walk comfortably? within room only on good days, unable on bad days   ? Patient Stated Goals to regain PLOF   ? Currently in Pain? Yes   ? Pain Score 8    ? Pain Location Back   ? Pain Orientation Mid;Lower   ? Pain Descriptors / Indicators Aching   ? Pain Type Chronic pain   ? Pain Onset 1 to 4 weeks ago   ? Pain  Frequency Intermittent   ? ?  ?  ? ?  ? ? ? ? ? ? ? ? ?Supine:  ?Hamstring lengthening with leg on PT shoulder 60 seconds each LE; x2 trials ?Popliteal angle 60 seconds each LE x2 trials each LE ?Single knee to chest 60 seconds each LE ?Knee to cross body shoulder 60 seconds each LE ?LE rotation for low back relief x 60 seconds x 4 trials each  ?GTB abduction 20x  ?GTB marches 20x each LE ?posterior pelvic tilt 10x ? ?  ?Prone: ? ?Grade II-III UPA and CPA lumbar and thoracic mobilizations x 6 minutes ?hip flexor lengthening stretch 60 seconds each LE ?Hamstring curl 15x each LE ? ?Seated:  ?Sit to stand 60 second holds with BUE support x4 trials ?Large swiss ball  forward rollouts 10x 10 second holds ?Lateral weight shift and core activation x3 minutes ? ?Trigger Point Dry Needling (TDN), unbilled ?Education performed with patient regarding potential benefit of TDN. Reviewed precautions and risks with patient. Reviewed special precautions/risks over lung fields which include pneumothorax. Reviewed signs and symptoms of pneumothorax and advised pt to go to ER immediately if these symptoms develop advise them of dry needling treatment. Extensive time spent with pt to ensure full understanding of TDN risks. Pt provided verbal consent to treatment. TDN performed to  with 0.3 x 30 single needle placements with local twitch response (LTR). Pistoning technique utilized. Improved pain-free motion following intervention. Muscles targeted bilateral lumbar and thoracic parapsinals x 3 minutes ? ? ? ?  ?Pt educated throughout session about proper posture and technique with exercises. Improved exercise technique, movement at target joints, use of target muscles after min to mod verbal, visual, tactile cues ? ? ? ?Patient presents with high tone and pain in back that is reduced with manual, therex, and TDN. Patient is highly motivated throughout session despite her fatigue. She is able to perform short duration standing interventions with occasional buckling of limbs. Increased tone noted on LLE this session. Patient will continue to benefit from skilled PT to address gait deficits, standing and mobility tolerance, and strength to improve quality of life and level of independence with functional mobility and ADLs. ? ? ? ? ? ? ? ? ? ? ? ? ? ? ? ? PT Education - 07/21/21 1152   ? ? Education Details exercise technique, body mechanics   ? Person(s) Educated Patient   ? Methods Explanation;Demonstration;Tactile cues;Verbal cues   ? Comprehension Verbalized understanding;Returned demonstration;Verbal cues required;Tactile cues required   ? ?  ?  ? ?  ? ? ? PT Short Term Goals - 12/09/20 1201    ? ?  ? PT SHORT TERM GOAL #1  ? Title Patient will demonstrate coordinated diaphragmatic breathing with pelvic tilts to demonstrate improved control of diaphragm and TA, to allow for further strengthening of core musculature and decreased pelvic floor spasm.   ? Baseline Pt. demonstrates breathing dysfunction and poor PFM coordination evidenced by anal manometry As of 2/22: Pt. continues to have restriction in her diaphragm and near T/L junction but is able to intentionally use diaphragmatic breathing through the available ROM.   ? Time 5   ? Period Weeks   ? Status Achieved   ? Target Date 04/20/19   ?  ? PT SHORT TERM GOAL #2  ? Title Patient will report a reduction in pain to no greater than 6/10 over the prior week to demonstrate symptom improvement.   ? Baseline Pain is 10/10 at  worst, 2/10 at best As of 2/22: Pt. had achieved this goal for 1 week as of 1/21 but may have infection or other insult that caused pain to increase again. As of 4/29: Pain high of 8/10 but has been over-doing her activity over the past week. As of 6/10: Pt. was able to keep pain at 6 or below since prior visit by using TENS to help keep tension lower but has not had a full week to test if it will keep working. As of 7/6: Pt. had met this goal but has a new UTI and it caused pain to spike to 8/10 over the weekend. 8/25: 6/10 due to back pain   ? Time 5   ? Period Weeks   ? Status Achieved   ? Target Date 01/13/21   ?  ? PT SHORT TERM GOAL #3  ? Title Patient will demonstrate HEP x1 in the clinic to demonstrate understanding and proper form to allow for further improvement.   ? Baseline Pt. lacks knowledge of therepeutic exercises that can decrease her pain/Sx.   ? Time 5   ? Period Weeks   ? Status Achieved   ? Target Date 04/20/19   ?  ? PT SHORT TERM GOAL #4  ? Title Patient will report consistent use of foot-stool (squatty-potty) for positioning with BM to decrease pain with BM and intra-abdominal pressure.   ? Baseline Pt. having  constipation due to PFM dysfunction   ? Time 5   ? Period Weeks   ? Status Achieved   ? Target Date 04/20/19   ? ?  ?  ? ?  ? ? ? ? PT Long Term Goals - 06/06/21 0001   ? ?  ? PT LONG TERM GOAL #1  ? Title

## 2021-07-25 ENCOUNTER — Ambulatory Visit: Payer: BC Managed Care – PPO

## 2021-07-28 ENCOUNTER — Ambulatory Visit: Payer: BC Managed Care – PPO | Admitting: Physical Therapy

## 2021-07-28 DIAGNOSIS — M62838 Other muscle spasm: Secondary | ICD-10-CM

## 2021-07-28 DIAGNOSIS — R2689 Other abnormalities of gait and mobility: Secondary | ICD-10-CM

## 2021-07-28 DIAGNOSIS — R262 Difficulty in walking, not elsewhere classified: Secondary | ICD-10-CM

## 2021-07-28 DIAGNOSIS — R269 Unspecified abnormalities of gait and mobility: Secondary | ICD-10-CM

## 2021-07-28 DIAGNOSIS — M6281 Muscle weakness (generalized): Secondary | ICD-10-CM

## 2021-07-28 NOTE — Patient Instructions (Addendum)
WC -> bed transfer  ?With sliding and a strap  ? ?___ ?Transfer with L hip leading,  ?Transfer board under hips, use the shoulders down and back and hands and L foot ground as points of contact  ? ?Strap under R thigh if need to assist to lift ,  ?Exhale to lift or exert,  ?Turn the body and thighs to 45 deg to the L with  ?Increment movement of R thigh and L leg  ? ?LONG SITTING -> elbows, forearms, fists as points of contact with L kneet bent, pressing foot  ? ? ?Getting up: ?Scoot body to diagonally before sitting up ?use the elbows, forearms, fists to minimize straining neck and pelvic floor/ stomach muscles  ? ?Sliding board on L, right side of WC is by the bed for getting up,  ( Ask caregiver to move [facility] to the end of the bed right after you get into the bed so it is ready for you to get out of bed using L hip leading)   ? ?___ ? ?Two seated exercises in WC to minimize blood clots and increase mobility and aerobic health  ? ?Rainbow ( sideflexion), expand ribs, shoulders down, and equal weight on wrist and ballmounds)  ?10 reps open the breath and ribcage to minimize blood clots ? ?Shoulders squeezes and chin tuck with hands  by side on seat ?5 sec count aloud, 10 reps  ? ?Do the ankle pumps  ? ?___ ? Read the "Why I still hurt " pamphlet ( pain science education)  ? ?____ ?  Follow up with surgeon about osteomy and transfers to bed on R side and sleeping on R  ?

## 2021-07-29 NOTE — Therapy (Signed)
[place] [facility] [facility] ?[place], [place], [address] ?Phone: [phone]   Fax:  [phone] ? ?Physical Therapy Treatment ? ?Patient Details  ?Name: Christine Chavez ?MRN: [id number] ?Date of Birth: 05/25/1980 ?Referring Provider (PT): [facility] ? ? ?Encounter Date: 07/28/2021 ? ? PT End of Session - 07/29/21 1114   ? ? Visit Number [id number]   ? Number of Visits 172   ? Date for PT Re-Evaluation 08/22/21   ? Authorization Type 8/10 PN 2/20   ? Progress Note Due on Visit 150   ? PT Start Time 1100   ? PT Stop Time 0867   ? PT Time Calculation (min) 75 min   ? Activity Tolerance Patient tolerated treatment well;Patient limited by fatigue   ? Behavior During Therapy [facility] for tasks assessed/performed   ? ?  ?  ? ?  ? ? ?Past Medical History:  ?Diagnosis Date  ? Complication of anesthesia   ? ? seizures after anesthesia   ? Headache   ? Migraines   ? Neurogenic bladder   ? Renal disorder   ? Vision abnormalities   ? ? ?Past Surgical History:  ?Procedure Laterality Date  ? ANTERIOR CRUCIATE LIGAMENT REPAIR  1997  ? APPENDECTOMY    ? COLONOSCOPY WITH PROPOFOL N/A 11/11/2018  ? Procedure: COLONOSCOPY WITH PROPOFOL;  Surgeon: [name], MD;  Location: [facility] ENDOSCOPY;  Service: Endoscopy;  Laterality: N/A;  ? CYSTOSCOPY WITH STENT PLACEMENT Right 04/17/2016  ? Procedure: CYSTOSCOPY WITH STENT PLACEMENT;  Surgeon: [name], MD;  Location: [facility] ORS;  Service: Urology;  Laterality: Right;  ? ESOPHAGOGASTRODUODENOSCOPY (EGD) WITH PROPOFOL N/A 11/11/2018  ? Procedure: ESOPHAGOGASTRODUODENOSCOPY (EGD) WITH PROPOFOL;  Surgeon: [name], MD;  Location: [facility] ENDOSCOPY;  Service: Endoscopy;  Laterality: N/A;  ? [name]  ? KIDNEY STONE SURGERY  04/2016  ? REVISION UROSTOMY CUTANEOUS    ? REVISION UROSTOMY CUTANEOUS  01/10/2018  ? SUPRAPUBIC CATHETER PLACEMENT  08/2017  ? TONSILLECTOMY    ? ? ?There were no vitals filed for this  visit. ? ? Subjective Assessment - 07/28/21 1110   ? ? Subjective Pt will be undergoing her R hip surgery ( labral repair and hip impingement) on 08/03/21. Pt's surgeon will be referring her to get post -surgery PT. Pt is feeling stressed. Pt's bladder spasm medications are getting changed . Pt started her period yesterday and feeling cramps and lots of clotting. US findings in January showed adenomyosis and neg for CA. The bladder spasms bother her most. Pt has been prescribed vaginal valium and try to avoid because they causeher drowsiness.  Pt saw her gynecologist at [facility] who agreed that she needs a hysterectomy but she receommended pt to see [facility] special[facility] surgeons whom pt will see tomorrow to discuss about how the osteomy plays a role in her hysterectomy surgery.  Pre-surgery prep information: Pt's parents bought her a Risk analyst chair. Pt plans to get a husband pillow to use when sitting up in bed while recovery.   ? 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 Due to the rarity of her disorder it took years prior to diagnosis. 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  Patient has  good days and bad days. Her good days allow for short ambulation (within room only not between rooms) and her bad days she is unable to ambulate.   ? Limitations Lifting;Standing;Walking;House hold activities;Other (comment)   ? How long can you stand comfortably? limited on bad days   ? How long can you walk comfortably? within room only on good days, unable on bad days   ? Patient Stated Goals to regain PLOF   ? Pain Onset 1 to 4 weeks ago   ? ?  ?  ? ?  ? ? ? ? ? [facility] PT Assessment - 07/29/21 1111   ? ?  ? Bed Mobility  ? Bed  Mobility --   UE assist for lifting BLE from EOB to long sitting, IND with slideing board use WC <> EOB  ? ?  ?  ? ?  ? ? ? ? ? ? ? ? ? ? ? ? ? ? ? ? [facility] Adult PT Treatment/Exercise - 07/29/21 1111   ? ?  ? Therapeutic Activites   ? Other Therapeutic Activities explained bed mobility t/f in prep for hip surgery, discussed pillow placement to minimize straining ab/ pelvic floor/ back, techniques with exhalation with lifting leg in t/f to optimize deep core system   ?  ? Neuro Re-ed   ? Neuro Re-ed Details  cued for seated stretches for lengthening of intecostals to optimize diaphragm for aerobic health , minimize blood clots, help with pelvic floor and baldder spasms, cued for scapular stabilization and lat strengthening to assist with t/f with less straining of ab/ pelvic region to minimize pain   ? ?  ?  ? ?  ? ? ? ? ? ? ? ? ? ? ? ? PT Short Term Goals - 12/09/20 1201   ? ?  ? PT SHORT TERM GOAL #1  ? Title Patient will demonstrate coordinated diaphragmatic breathing with pelvic tilts to demonstrate improved control of diaphragm and TA, to allow for further strengthening of core musculature and decreased pelvic floor spasm.   ? Baseline Pt. demonstrates breathing dysfunction and poor PFM coordination evidenced by anal manometry As of 2/22: Pt. continues to have restriction in her diaphragm and near T/L junction but is able to intentionally use diaphragmatic breathing through the available ROM.   ? Time 5   ? Period Weeks   ? Status Achieved   ? Target Date 04/20/19   ?  ? PT SHORT TERM GOAL #2  ? Title Patient will report a reduction in pain to no greater than 6/10 over the prior week to demonstrate symptom improvement.   ? Baseline Pain is 10/10 at worst, 2/10 at best As of 2/22: Pt. had achieved this goal for 1 week as of 1/21 but may have infection or other insult that caused pain to increase again. As of 4/29: Pain high of 8/10 but has been over-doing her activity over the past week. As of 6/10: Pt. was able  to keep pain at 6 or below since prior visit by using TENS to help keep tension lower but has not had a full week to test if it will keep working. As of 7/6: Pt. had met this goal but has a new UTI and it caused pain to spike to 8/10 over the weekend. 8/25: 6/10 due to back pain   ? Time 5   ? Period Weeks   ? Status Achieved   ? Target Date 01/13/21   ?  ?  PT SHORT TERM GOAL #3  ? Title Patient will demonstrate HEP x1 in the clinic to demonstrate understanding and proper form to allow for further improvement.   ? Baseline Pt. lacks knowledge of therepeutic exercises that can decrease her pain/Sx.   ? Time 5   ? Period Weeks   ? Status Achieved   ? Target Date 04/20/19   ?  ? PT SHORT TERM GOAL #4  ? Title Patient will report consistent use of foot-stool (squatty-potty) for positioning with BM to decrease pain with BM and intra-abdominal pressure.   ? Baseline Pt. having constipation due to PFM dysfunction   ? Time 5   ? Period Weeks   ? Status Achieved   ? Target Date 04/20/19   ? ?  ?  ? ?  ? ? ? ? PT Long Term Goals - 06/06/21 0001   ? ?  ? PT LONG TERM GOAL #1  ? Title Patient will be independent with manual wheelchair mobility in public location such as store for increased independence.   ? Baseline 11/21: unable to push herself around entire store yet 12/15: Patient reports being able to push herself around store independently but requires multiple rest breaks and assistance with managing shopping basket/items. 2/13: able to self propel but has to take rest breaks   ? Time 12   ? Period Weeks   ? Status Achieved   ?  ? PT LONG TERM GOAL #2  ? Title Patient will increase 10 meter walk test to <30 seconds as to improve gait speed for better community ambulation and to reduce fall risk.   ? Baseline 6/2: 1 min 18 seconds with rollator 8/25: deferred due to excessive knee buckling during TUG 10/17: 1 min 38 seconds with frequent knee buckling 11/21: 1 min 11 seconds with knee buckling with rollator 12/15: deferred  secondary to pain 2/20: unable to do due to bilateral LE buckling   ? Time 12   ? Period Weeks   ? Status On-going   ? Target Date 08/22/21   ?  ? PT LONG TERM GOAL #3  ? Title Patient (< 51 years old)

## 2021-08-01 ENCOUNTER — Ambulatory Visit: Payer: BC Managed Care – PPO

## 2021-08-01 DIAGNOSIS — M62838 Other muscle spasm: Secondary | ICD-10-CM | POA: Diagnosis not present

## 2021-08-01 DIAGNOSIS — R269 Unspecified abnormalities of gait and mobility: Secondary | ICD-10-CM

## 2021-08-01 DIAGNOSIS — M6281 Muscle weakness (generalized): Secondary | ICD-10-CM

## 2021-08-01 NOTE — Therapy (Signed)
[place] ?[place] [facility] ?[place], [place], [address] ?Phone: [phone]   Fax:  [phone] ? ?Physical Therapy Treatment ? ?Patient Details  ?Name: Christine Chavez ?MRN: [id number] ?Date of Birth: [birth year] ?Referring Provider (PT): [facility] ? ? ?Encounter Date: 08/01/2021 ? ? PT End of Session - 08/01/21 1048   ? ? Visit Number [id number]   ? Number of Visits 172   ? Date for PT Re-Evaluation 08/22/21   ? Authorization Type 9/10 PN 2/20   ? Progress Note Due on Visit 150   ? PT Start Time 1059   ? PT Stop Time 1144   ? PT Time Calculation (min) 45 min   ? Activity Tolerance Patient tolerated treatment well;Patient limited by fatigue   ? Behavior During Therapy [facility] for tasks assessed/performed   ? ?  ?  ? ?  ? ? ?Past Medical History:  ?Diagnosis Date  ? Complication of anesthesia   ? ? seizures after anesthesia   ? Headache   ? Migraines   ? Neurogenic bladder   ? Renal disorder   ? Vision abnormalities   ? ? ?Past Surgical History:  ?Procedure Laterality Date  ? ANTERIOR CRUCIATE LIGAMENT REPAIR  1997  ? APPENDECTOMY    ? COLONOSCOPY WITH PROPOFOL N/A 11/11/2018  ? Procedure: COLONOSCOPY WITH PROPOFOL;  Surgeon: [name], MD;  Location: [facility] ENDOSCOPY;  Service: Endoscopy;  Laterality: N/A;  ? CYSTOSCOPY WITH STENT PLACEMENT Right 04/17/2016  ? Procedure: CYSTOSCOPY WITH STENT PLACEMENT;  Surgeon: [name], MD;  Location: [facility] ORS;  Service: Urology;  Laterality: Right;  ? ESOPHAGOGASTRODUODENOSCOPY (EGD) WITH PROPOFOL N/A 11/11/2018  ? Procedure: ESOPHAGOGASTRODUODENOSCOPY (EGD) WITH PROPOFOL;  Surgeon: [name], MD;  Location: [facility] ENDOSCOPY;  Service: Endoscopy;  Laterality: N/A;  ? [place]  ? KIDNEY STONE SURGERY  04/2016  ? REVISION UROSTOMY CUTANEOUS    ? REVISION UROSTOMY CUTANEOUS  01/10/2018  ? SUPRAPUBIC CATHETER PLACEMENT  08/2017  ? TONSILLECTOMY    ? ? ?There were no vitals filed for this  visit. ? ? Subjective Assessment - 08/01/21 1248   ? ? Subjective Patient is going to undergo labral repair and hip impingement surgery on 08/03/21. Patient reports stress and increased back pain.   ? 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 Due to the rarity of her disorder it took years prior to diagnosis. 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  Patient has good days and bad days. Her good days allow for short ambulation (within room only not between rooms) and her bad days she is unable to ambulate.   ? Limitations Lifting;Standing;Walking;House hold activities;Other (comment)   ? How long can you stand comfortably? limited on bad days   ? How long can you walk comfortably? within room only on good days, unable on bad days   ? Patient Stated Goals to regain PLOF   ? Currently in Pain? Yes   ? Pain Score 8    ? Pain Location Back   ? Pain Orientation Mid;Lower   ? Pain Descriptors / Indicators Aching   ? Pain Type Chronic pain   ? Pain Onset 1 to 4 weeks ago   ?  Pain Frequency Intermittent   ? ?  ?  ? ?  ? ? ? ? ? ?Last treatment prior to surgery.  ? ? ? ? ?Manual: ?Hamstring lengthening 60 seconds each LE  ?Single knee to chest 60 seconds each LE ?Figure four stretch 60 seconds each LE ?Prone hip flexor lengthening stretch 60 seconds each LE  ?Grade II-III mobilizations to lumbar and thoracic spine x8 seconds each level CPA and UPA.  ? ?TherAct: ?STS transfer training with varying levels of weightbearing for training s/p surgery as patient is unsure of her restriction: FWB, PWB, TTWB, NWB  ?PWB stand pivot transfer training x 2 trials  ? ?TherEx ?GTB march 15x supine ?GTB abduction 15x abduction ? ?Trigger  Point Dry Needling (TDN), unbilled ?Education performed with patient regarding potential benefit of TDN. Reviewed precautions and risks with patient. Reviewed special precautions/risks over lung fields which include pneumothorax. Reviewed signs and symptoms of pneumothorax and advised pt to go to ER immediately if these symptoms develop advise them of dry needling treatment. Extensive time spent with pt to ensure full understanding of TDN risks. Pt provided verbal consent to treatment. TDN performed to  with 0.3 x 30 single needle placements with local twitch response (LTR). Pistoning technique utilized. Improved pain-free motion following intervention. Muscles targeted: bilateral lumbar paraspinals x 3 minutes  ? ? ? ? ?Pt educated throughout session about proper posture and technique with exercises. Improved exercise technique, movement at target joints, use of target muscles after min to mod verbal, visual, tactile cues. ? ? ?Patient has upcoming surgery in two days. She is aware she will need a new order to return to therapy after surgery; however I will not discharge patient until after surgery as she has had the surgery pushed back last attempt. Patient will continue to benefit from skilled PT to address gait deficits, standing and mobility tolerance, and strength to improve quality of life and level of independence with functional mobility and ADLs. ? ? ? ? ? ? ? ? ? ? ? ? ? ? PT Education - 08/01/21 1048   ? ? Education Details pre surgery education   ? Person(s) Educated Patient   ? Methods Explanation;Demonstration;Tactile cues;Verbal cues   ? Comprehension Verbalized understanding;Returned demonstration;Verbal cues required;Tactile cues required   ? ?  ?  ? ?  ? ? ? PT Short Term Goals - 12/09/20 1201   ? ?  ? PT SHORT TERM GOAL #1  ? Title Patient will demonstrate coordinated diaphragmatic breathing with pelvic tilts to demonstrate improved control of diaphragm and TA, to allow for further strengthening  of core musculature and decreased pelvic floor spasm.   ? Baseline Pt. demonstrates breathing dysfunction and poor PFM coordination evidenced by anal manometry As of 2/22: Pt. continues to have restriction in her diaphragm and near T/L junction but is able to intentionally use diaphragmatic breathing through the available ROM.   ? Time 5   ? Period Weeks   ? Status Achieved   ? Target Date 04/20/19   ?  ? PT SHORT TERM GOAL #2  ? Title Patient will report a reduction in pain to no greater than 6/10 over the prior week to demonstrate symptom improvement.   ? Baseline Pain is 10/10 at worst, 2/10 at best As of 2/22: Pt. had achieved this goal for 1 week as of 1/21 but may have infection or other insult that caused pain to increase again. As of 4/29: Pain high of 8/10 but has  been over-doing her activity over the past week. As of 6/10: Pt. was able to keep pain at 6 or below since prior visit by using TENS to help keep tension lower but has not had a full week to test if it will keep working. As of 7/6: Pt. had met this goal but has a new UTI and it caused pain to spike to 8/10 over the weekend. 8/25: 6/10 due to back pain   ? Time 5   ? Period Weeks   ? Status Achieved   ? Target Date 01/13/21   ?  ? PT SHORT TERM GOAL #3  ? Title Patient will demonstrate HEP x1 in the clinic to demonstrate understanding and proper form to allow for further improvement.   ? Baseline Pt. lacks knowledge of therepeutic exercises that can decrease her pain/Sx.   ? Time 5   ? Period Weeks   ? Status Achieved   ? Target Date 04/20/19   ?  ? PT SHORT TERM GOAL #4  ? Title Patient will report consistent use of foot-stool (squatty-potty) for positioning with BM to decrease pain with BM and intra-abdominal pressure.   ? Baseline Pt. having constipation due to PFM dysfunction   ? Time 5   ? Period Weeks   ? Status Achieved   ? Target Date 04/20/19   ? ?  ?  ? ?  ? ? ? ? PT Long Term Goals - 06/06/21 0001   ? ?  ? PT LONG TERM GOAL #1  ? Title  Patient will be independent with manual wheelchair mobility in public location such as store for increased independence.   ? Baseline 11/21: unable to push herself around entire store yet 12/15: [place]

## 2021-08-04 ENCOUNTER — Ambulatory Visit: Payer: BC Managed Care – PPO | Admitting: Physical Therapy

## 2021-08-08 ENCOUNTER — Ambulatory Visit: Payer: BC Managed Care – PPO

## 2021-08-11 ENCOUNTER — Ambulatory Visit: Payer: BC Managed Care – PPO | Admitting: Physical Therapy

## 2021-08-15 ENCOUNTER — Ambulatory Visit: Payer: BC Managed Care – PPO

## 2021-08-18 ENCOUNTER — Ambulatory Visit: Payer: BC Managed Care – PPO | Admitting: Physical Therapy

## 2021-08-22 ENCOUNTER — Ambulatory Visit: Payer: BC Managed Care – PPO | Admitting: Physical Therapy

## 2021-08-25 ENCOUNTER — Ambulatory Visit: Payer: BC Managed Care – PPO

## 2021-08-29 ENCOUNTER — Ambulatory Visit: Payer: BC Managed Care – PPO

## 2021-09-01 ENCOUNTER — Ambulatory Visit: Payer: BC Managed Care – PPO | Admitting: Physical Therapy

## 2021-09-05 ENCOUNTER — Ambulatory Visit: Payer: BC Managed Care – PPO

## 2021-09-08 ENCOUNTER — Ambulatory Visit: Payer: BC Managed Care – PPO | Admitting: Physical Therapy

## 2021-09-15 ENCOUNTER — Ambulatory Visit: Payer: BC Managed Care – PPO | Admitting: Physical Therapy

## 2021-09-19 ENCOUNTER — Ambulatory Visit: Payer: BC Managed Care – PPO

## 2021-09-22 ENCOUNTER — Ambulatory Visit: Payer: BC Managed Care – PPO | Admitting: Physical Therapy

## 2021-09-23 IMAGING — US US ABDOMEN LIMITED
1 series · 14 of 25 positions shown · non-contrast
Comparison: 08/28/2018

CLINICAL DATA: RIGHT upper quadrant abdominal pain for couple weeks

EXAM:
ULTRASOUND ABDOMEN LIMITED RIGHT UPPER QUADRANT

[Series 1: us abdomen limited · 14 of 38 slices shown]
[im 1/38]
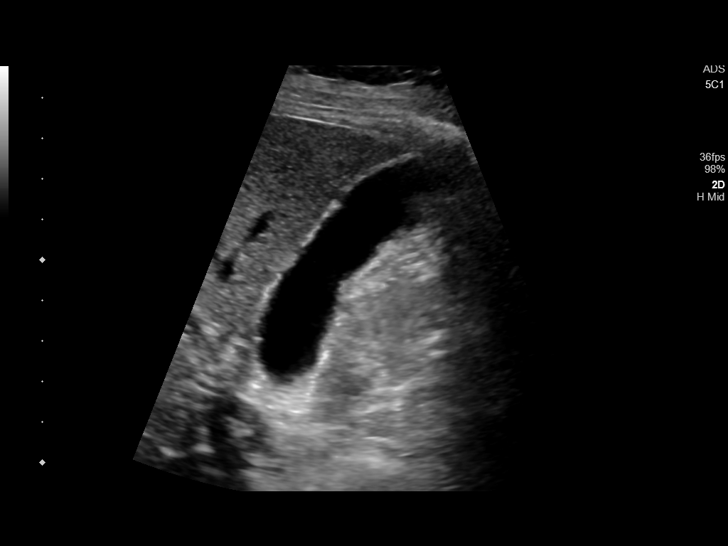
[im 4/38]
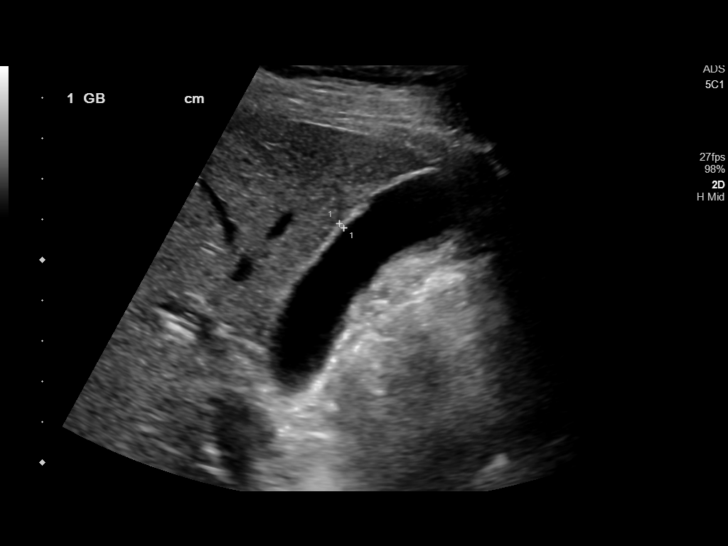
[im 7/38]
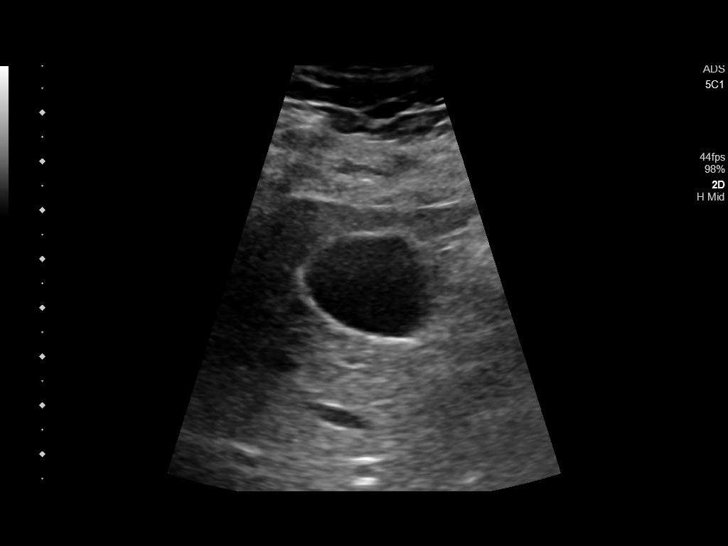
[im 10/38]
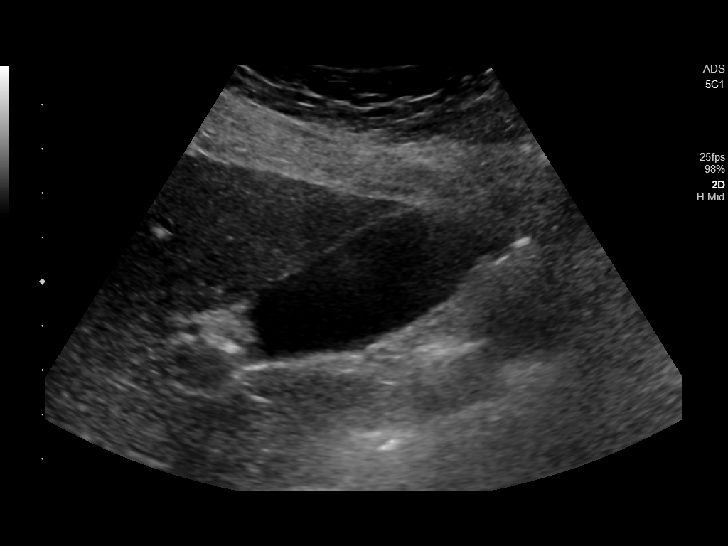
[im 13/38]
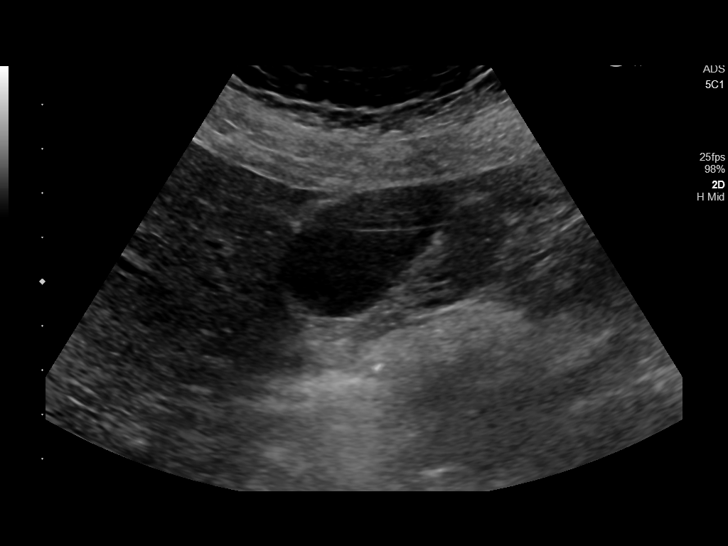
[im 14/38]
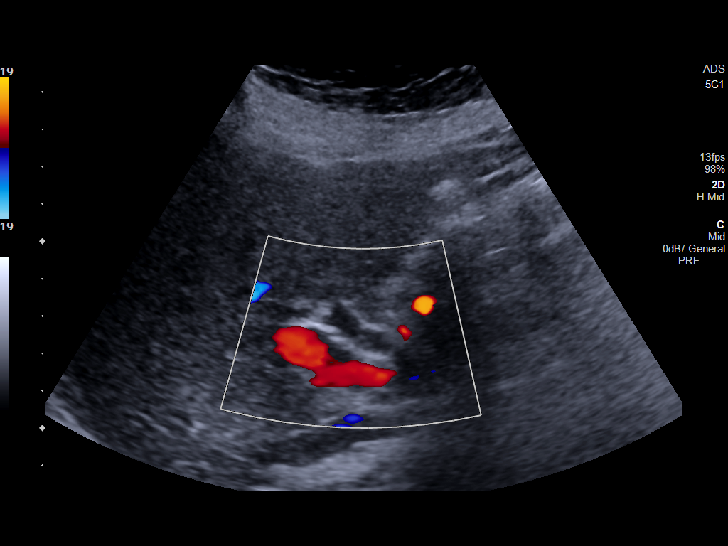
[im 17/38]
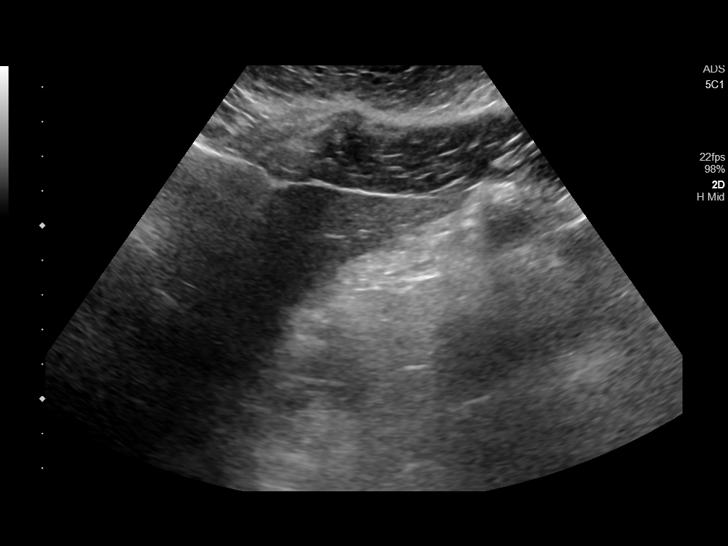
[im 21/38]
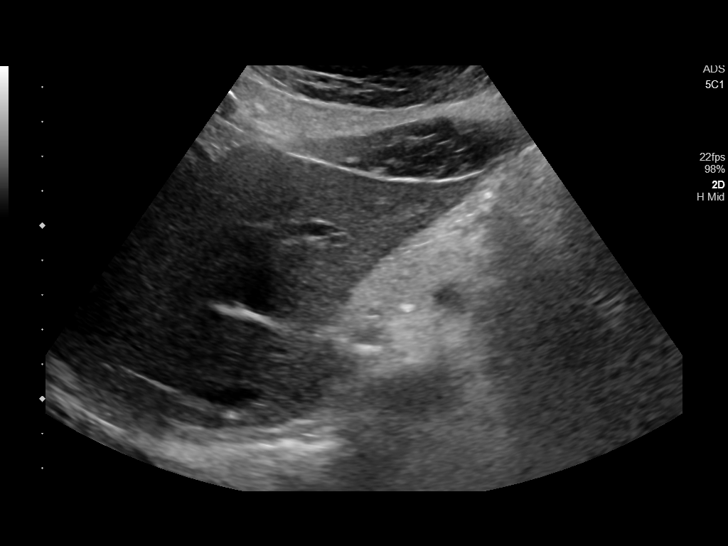
[im 24/38]
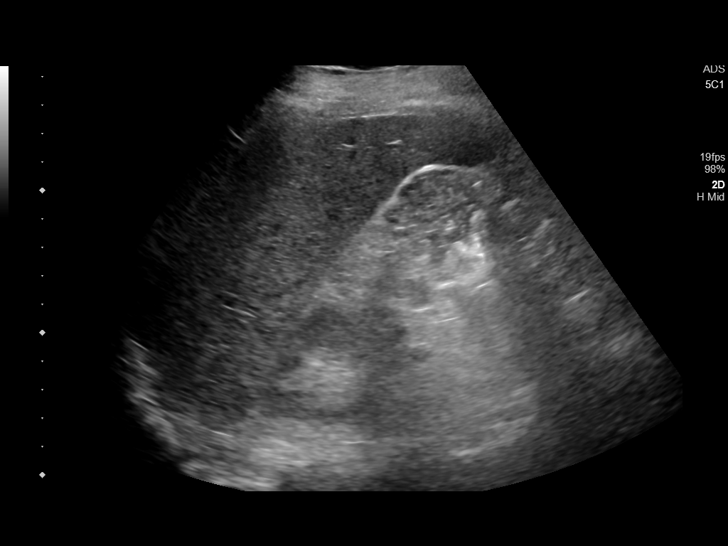
[im 25/38]
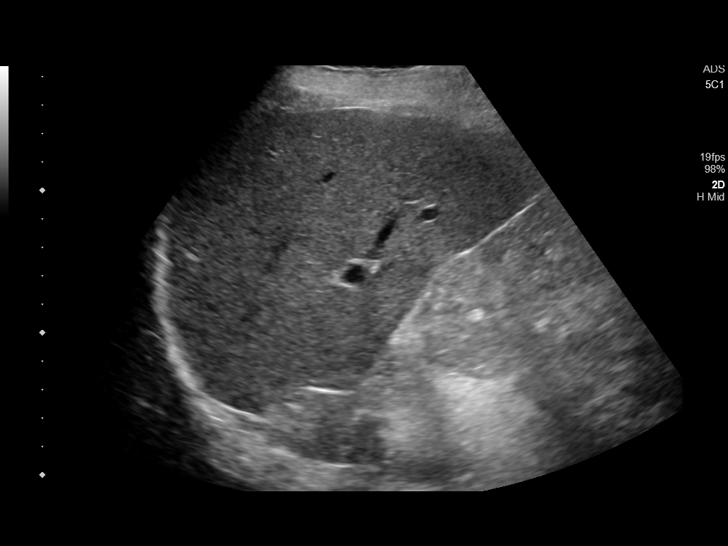
[im 28/38]
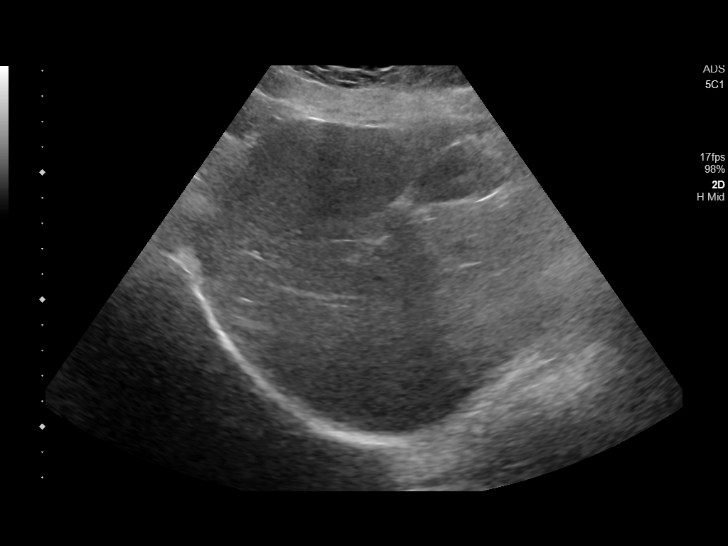
[im 31/38]
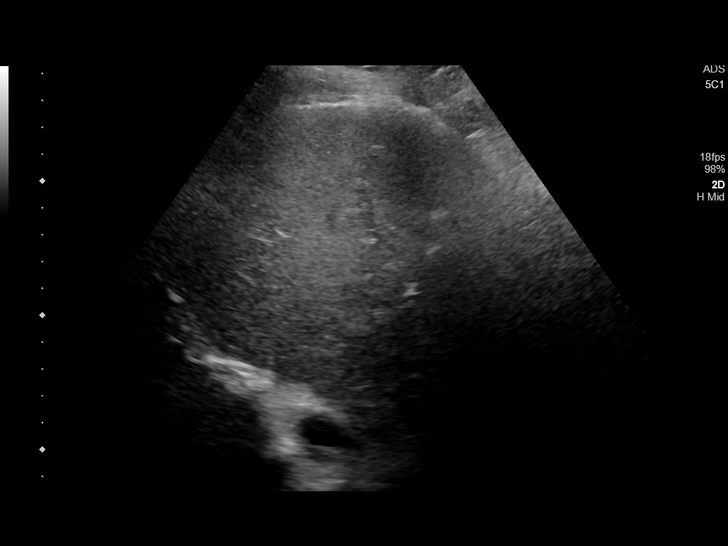
[im 34/38]
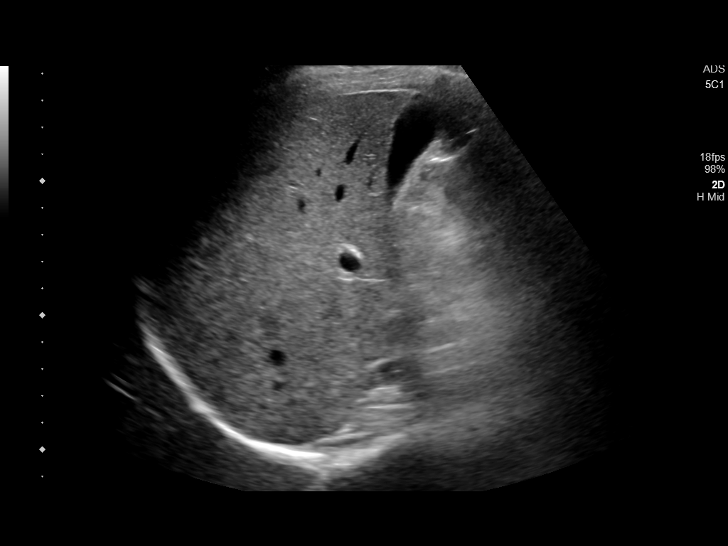
[im 38/38]
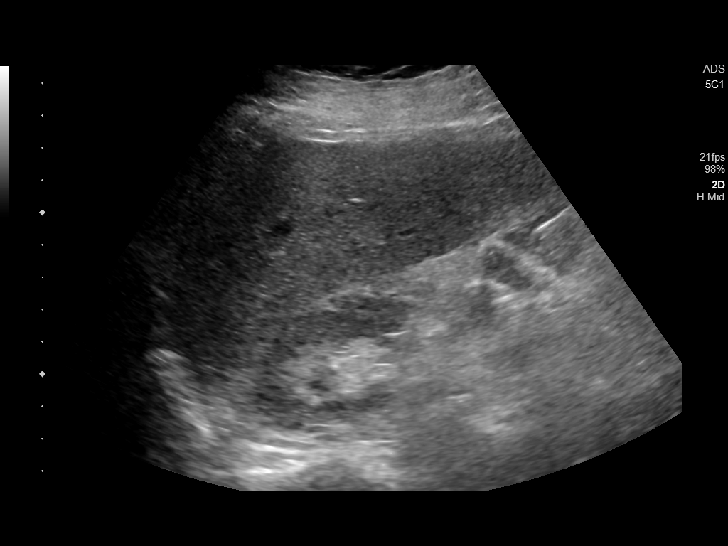

[14 of 25 positions shown; findings below may reference images not displayed]

FINDINGS: Gallbladder:

Normally distended without stones or wall thickening. No
pericholecystic fluid or sonographic Murphy sign.

Common bile duct:

Diameter: 5 mm, normal

Liver:

Normal echogenicity without mass or nodularity. Portal vein is
patent on color Doppler imaging with normal direction of blood flow
towards the liver.

Other: No RIGHT upper quadrant free fluid
IMPRESSION: Normal exam.

## 2021-09-26 ENCOUNTER — Ambulatory Visit: Payer: BC Managed Care – PPO

## 2021-09-26 ENCOUNTER — Ambulatory Visit: Payer: BC Managed Care – PPO | Admitting: Physical Therapy

## 2021-09-29 ENCOUNTER — Ambulatory Visit: Payer: BC Managed Care – PPO

## 2021-10-03 ENCOUNTER — Ambulatory Visit: Payer: BC Managed Care – PPO | Attending: Gastroenterology

## 2021-10-03 ENCOUNTER — Ambulatory Visit: Payer: BC Managed Care – PPO

## 2021-10-03 DIAGNOSIS — R262 Difficulty in walking, not elsewhere classified: Secondary | ICD-10-CM | POA: Diagnosis present

## 2021-10-03 DIAGNOSIS — M25551 Pain in right hip: Secondary | ICD-10-CM | POA: Insufficient documentation

## 2021-10-03 DIAGNOSIS — M6281 Muscle weakness (generalized): Secondary | ICD-10-CM | POA: Diagnosis present

## 2021-10-03 NOTE — Therapy (Signed)
[facility] MAIN [facility] SERVICES [address] [place], [place], [address] Phone: [phone]   Fax:  [phone]  Physical Therapy Evaluation  Patient Details  Name: Christine Chavez MRN: [id number] Date of Birth: 27-Feb-1981 Referring Provider (PT): Lacy Duverney MD   Encounter Date: 10/03/2021   PT End of Session - 10/03/21 1217     Visit Number 1    Number of Visits 24    Date for PT Re-Evaluation 12/26/21    Authorization Type 1/10 eval 10/03/21    PT Start Time 1100    PT Stop Time 1158    PT Time Calculation (min) 58 min    Equipment Utilized During Treatment Gait belt    Activity Tolerance Patient tolerated treatment well;Patient limited by fatigue;Patient limited by pain    Behavior During Therapy WFL for tasks assessed/performed             Past Medical History:  Diagnosis Date   Complication of anesthesia    ? seizures after anesthesia    Headache    Migraines    Neurogenic bladder    Renal disorder    Vision abnormalities     Past Surgical History:  Procedure Laterality Date   ANTERIOR CRUCIATE LIGAMENT REPAIR  1997   APPENDECTOMY     COLONOSCOPY WITH PROPOFOL N/A 11/11/2018   Procedure: COLONOSCOPY WITH PROPOFOL;  Surgeon: Christena Deem, MD;  Location: [facility] ENDOSCOPY;  Service: Endoscopy;  Laterality: N/A;   CYSTOSCOPY WITH STENT PLACEMENT Right 04/17/2016   Procedure: CYSTOSCOPY WITH STENT PLACEMENT;  Surgeon: Malen Gauze, MD;  Location: [facility] ORS;  Service: Urology;  Laterality: Right;   ESOPHAGOGASTRODUODENOSCOPY (EGD) WITH PROPOFOL N/A 11/11/2018   Procedure: ESOPHAGOGASTRODUODENOSCOPY (EGD) WITH PROPOFOL;  Surgeon: Christena Deem, MD;  Location: [facility] ENDOSCOPY;  Service: Endoscopy;  Laterality: N/A;   EXPLORATORY LAPAROTOMY  1999   KIDNEY STONE SURGERY  04/2016   REVISION UROSTOMY CUTANEOUS     REVISION UROSTOMY CUTANEOUS  01/10/2018   SUPRAPUBIC CATHETER PLACEMENT  08/2017   TONSILLECTOMY       There were no vitals filed for this visit.    Subjective Assessment - 10/03/21 1215     Pertinent History ?Patient is returning to PT s/p surgery R Hip arthroscopy w femoroplasty and labral repair, femoral osteochondroplasty complete capsular closure arthroscopy on 08/19/21. Patient has been seen prior to surgery by this author for her Hereditary Spastic Paraplegia (HSP). PMh includes bilateral Le edema, BLE weakness, constipation, chronic pain, GERD, tremor, HSP, migraine, neurogenic bladder,seizures, depression, migraine, COVID 19, chronic UTI, urostomy.  Saw physician June 8th. Has been starting gentle walking    Limitations Lifting;Standing;Walking;House hold activities;Other (comment);Sitting    How long can you sit comfortably? painful with prolonged upright sitting    How long can you stand comfortably? limited <5 minutes    How long can you walk comfortably? very challenging to walk    Patient Stated Goals to become more independent    Currently in Pain? Yes    Pain Score 5     Pain Location Leg    Pain Orientation Right    Pain Descriptors / Indicators Aching;Throbbing    Pain Type Surgical pain    Pain Onset More than a month ago    Pain Frequency Intermittent    Aggravating Factors  movement    Pain Relieving Factors laying on her back    Effect of Pain on Daily Activities limits mobility  [facility] - 10/03/21 0001       Assessment   Medical Diagnosis R arthroscopy + Hereditary Spastic Paraplegia    Referring Provider (PT) Lacy Duverneylson, Steven MD    Onset Date/Surgical Date --   hip: april 19th, 2023 HSP 02/16/15   Hand Dominance Right    Prior Therapy yes      Precautions   Precautions Fall;Anterior Hip      Restrictions   Weight Bearing Restrictions Yes    RLE Weight Bearing Weight bearing as tolerated      Balance Screen   Has the patient had a decrease in activity level because of a fear of falling?  Yes    Is the patient reluctant  to leave their home because of a fear of falling?  Yes      Home Environment   Living Environment Private residence    Living Arrangements Spouse/significant other;Children    Available Help at Discharge Family    Type of Home House    Home Layout One level    Home Equipment Walker - 4 wheels      Prior Function   Level of Independence Requires assistive device for independence      Observation/Other Assessments   Focus on Therapeutic Outcomes (FOTO)  34                  PAIN: Worst;10/10  Best: 3-4/10 current: 5/10   POSTURE: Slight sacral sit in manual wheelchair noted   PROM/AROM:  ROM BLE:  PROM Right Left  Hip flexion 62 68  Hip Abduction 30 30  Hip Adduction    Knee Extension  Neutral  Neutral   Knee Flexion 130 130   AROM Right Left  Hip flexion 5 10  Hip Abduction 8 8  Hip Adduction    Knee Extension  Neutral Neutral   Knee Flexion 126 120      STRENGTH:  Graded on a 0-5 scale Muscle Group Left Right  Hip Flex 3/5 3/5  Hip Abd 3/5 3/5  Hip Add 3/5 2+/5  Knee Flex 4-/5 3+/5  Knee Ext 3+/5 3/5  Ankle DF 4-/5 3/5  Ankle PF 4-/5 3/5       FUNCTIONAL MOBILITY: Sit to supine: min A for LE's Supine to sit: mod A for LE's and trunk  STS: heavy UE support  SPT: min A  BALANCE: Static Sitting Balance  Normal Able to maintain balance against maximal resistance   Good Able to maintain balance against moderate resistance   Good-/Fair+ Accepts minimal resistance x  Fair Able to sit unsupported without balance loss and without UE support   Poor+ Able to maintain with Minimal assistance from individual or chair   Poor Unable to maintain balance-requires mod/max support from individual or chair    Static Standing Balance  Normal Able to maintain standing balance against maximal resistance   Good Able to maintain standing balance against moderate resistance   Good-/Fair+ Able to maintain standing balance against minimal resistance   Fair  Able to stand unsupported without UE support and without LOB for 1-2 min   Fair- Requires Min A and UE support to maintain standing without loss of balance x  Poor+ Requires mod A and UE support to maintain standing without loss of balance   Poor Requires max A and UE support to maintain standing balance without loss    Dynamic Sitting Balance  Normal Able to sit unsupported and weight shift across midline maximally  Good Able to sit unsupported and weight shift across midline moderately   Good-/Fair+ Able to sit unsupported and weight shift across midline minimally   Fair Minimal weight shifting ipsilateral/front, difficulty crossing midline x  Fair- Reach to ipsilateral side and unable to weight shift   Poor + Able to sit unsupported with min A and reach to ipsilateral side, unable to weight shift   Poor Able to sit unsupported with mod A and reach ipsilateral/front-can't cross midline    Standing Dynamic Balance  Normal Stand independently unsupported, able to weight shift and cross midline maximally   Good Stand independently unsupported, able to weight shift and cross midline moderately   Good-/Fair+ Stand independently unsupported, able to weight shift across midline minimally   Fair Stand independently unsupported, weight shift, and reach ipsilaterally, loss of balance when crossing midline   Poor+ Able to stand with Min A and reach ipsilaterally, unable to weight shift x  Poor Able to stand with Mod A and minimally reach ipsilaterally, unable to cross midline.      GAIT: Utilization of rollator: patient has excessive bilateral knee buckling, tremors, and ataxia.   OUTCOME MEASURES: TEST Outcome Interpretation  5 times sit<>stand 33 sec; heavy BUE support >44 yo, >15 sec indicates increased risk for falls  10 meter walk test      1 min 30 to get 1/3 of the way and had to stop due to buckling of LE's  <1.0 m/s indicates increased risk for falls; limited community ambulator  FOTO  34 49            Objective measurements completed on examination: See above findings.                PT Education - 10/03/21 1217     Education Details goals, POC,    Person(s) Educated Patient    Methods Explanation;Demonstration;Tactile cues;Verbal cues    Comprehension Verbalized understanding;Returned demonstration;Verbal cues required;Tactile cues required              PT Short Term Goals - 10/03/21 1226       PT SHORT TERM GOAL #1   Title Patient will be independent in home exercise program to improve strength/mobility for better functional independence with ADLs.    Baseline 6/19 give next session    Time 6    Period Weeks    Status New    Target Date 11/14/21      PT SHORT TERM GOAL #2   Title Patient will report a reduction in pain to no greater than 6/10 over the prior week to demonstrate symptom improvement.    Baseline 6/19: 10/10 at worst    Time 6    Period Weeks    Status New    Target Date 11/14/21               PT Long Term Goals - 10/03/21 0001       PT LONG TERM GOAL #1   Title Patient will increase FOTO score to equal to or greater than  49   to demonstrate statistically significant improvement in mobility and quality of life    Baseline 6/19: 34    Time 12    Period Weeks    Status New    Target Date 12/26/21      PT LONG TERM GOAL #2   Title Patient will increase 10 meter walk test to <30 seconds as to improve gait speed for better community ambulation and to reduce  fall risk.    Baseline 6/19: unable to perform; walked 1 min 30 seconds for 1/3 of test.    Time 12    Period Weeks    Status New    Target Date 12/26/21      PT LONG TERM GOAL #3   Title Patient (< 67 years old) will complete five times sit to stand test in < 15 seconds indicating an increased LE strength and improved balance.    Baseline 6/19: 33 seconds heavy BUE support    Time 12    Period Weeks    Status New    Target Date 12/26/21       PT LONG TERM GOAL #4   Title Patient will increase hip strength to 4/5 for functional strength and mobility to increase independence.    Baseline 6/19: flex abd 3/5 add 2+/5    Time 12    Period Weeks    Status New    Target Date 12/26/21      PT LONG TERM GOAL #5   Title Patient will report a worst pain of 3/10 on VAS in R hip  to improve tolerance with ADLs and reduced symptoms with activities.    Baseline 6/19: 10/10    Time 12    Period Weeks    Status New    Target Date 12/26/21                    Plan - 10/03/21 1224     Clinical Impression Statement Patient is a very pleasant 41 year old woman with Hereditary Spastic Paraplegia that is returning to PT s/p R Hip arthroscopy w femoroplasty and labral repair, femoral osteochondroplasty complete capsular closure arthroscopy on 08/19/21.Patient is WBAT now and presents with her protocol to ensure safety. Patient will be having arthroplasty on L hip in September and her hysterectomy is on hold until her bladder is controlled. Patient is highly motivated for increased independence. Patient requires heavy use of Ue's for transfers and mobility. She will benefit from skilled physical therapy to increase strength, mobility, ROM, and increase independence for improved quality of life.    Personal Factors and Comorbidities Comorbidity 3+;Transportation;Time since onset of injury/illness/exacerbation;Finances;Past/Current Experience    Comorbidities bilateral Le edema, BLE weakness, constipation, chronic pain, GERD, tremor, HSP, migraine, neurogenic bladder, chronic UTI, urostomy.    Examination-Activity Limitations Caring for Others;Carry;Continence;Squat;Stairs;Toileting;Locomotion Level;Lift;Stand;Bed Mobility;Bend;Reach Overhead;Sit;Dressing;Transfers;Bathing;Hygiene/Grooming;Sleep    Examination-Participation Restrictions Church;Cleaning;Community Activity;Driving;Interpersonal Relationship;Laundry;Yard Work;Meal  Prep;School;Volunteer;Shop    Stability/Clinical Decision Making Unstable/Unpredictable    Clinical Decision Making High    Rehab Potential Fair    PT Frequency 2x / week    PT Duration 12 weeks    PT Treatment/Interventions ADLs/Self Care Home Management;Aquatic Therapy;Biofeedback;Moist Heat;Electrical Stimulation;Cryotherapy;Traction;Ultrasound;Therapeutic activities;Functional mobility training;Stair training;Gait training;Therapeutic exercise;Balance training;Neuromuscular re-education;Patient/family education;Manual techniques;Dry needling;Scar mobilization;Energy conservation;Taping;Joint Manipulations;Spinal Manipulations;Visual/perceptual remediation/compensation;Passive range of motion;Wheelchair mobility training;DME Instruction;Orthotic Fit/Training;Iontophoresis 4mg /ml Dexamethasone;Vestibular;Canalith Repostioning    PT Next Visit Plan progression per protocol    PT Home Exercise Plan give next session    Consulted and Agree with Plan of Care Patient             Patient will benefit from skilled therapeutic intervention in order to improve the following deficits and impairments:  Abnormal gait, Decreased balance, Decreased endurance, Decreased mobility, Difficulty walking, Increased muscle spasms, Impaired sensation, Improper body mechanics, Impaired tone, Decreased activity tolerance, Decreased coordination, Decreased strength, Postural dysfunction, Pain, Impaired flexibility, Increased edema, Hypomobility  Visit Diagnosis: Pain in right hip  Difficulty in walking, not elsewhere classified  Muscle weakness (generalized)     Problem List Patient Active Problem List   Diagnosis Date Noted   Seizure (HCC) 11/11/2018   Major depressive disorder, recurrent episode, moderate (HCC) 02/07/2018   Nephrolithiasis 04/16/2016   Numbness 07/28/2015   Bladder retention 06/23/2015   Abdominal pain 06/04/2015   Dizziness 05/18/2015   Neck pain 05/18/2015   Complicated migraine  04/28/2015   Other fatigue 04/28/2015   Abnormal finding on MRI of brain 04/28/2015   D (diarrhea) 03/29/2015   H/O disease 03/29/2015   Abnormal weight loss 03/29/2015   Muscle weakness (generalized) 03/14/2015   Headache, migraine 03/10/2015    Precious Bard, PT, DPT  10/03/2021, 12:34 PM  [facility] MAIN [facility] SERVICES [address] [place], [place], [address] Phone: [phone]   Fax:  [phone]  Name: Christine Chavez MRN: [id number] Date of Birth: [birth year]

## 2021-10-05 ENCOUNTER — Ambulatory Visit: Payer: BC Managed Care – PPO

## 2021-10-05 DIAGNOSIS — M25551 Pain in right hip: Secondary | ICD-10-CM | POA: Diagnosis not present

## 2021-10-05 DIAGNOSIS — M6281 Muscle weakness (generalized): Secondary | ICD-10-CM

## 2021-10-05 DIAGNOSIS — R262 Difficulty in walking, not elsewhere classified: Secondary | ICD-10-CM

## 2021-10-05 NOTE — Therapy (Signed)
OUTPATIENT PHYSICAL THERAPY TREATMENT NOTE   Patient Name: Christine Chavez MRN: [id number] DOB:April 25, 1980, 41 y.o., female Today's Date: 10/05/2021  PCP: Ermalinda Memos MD  REFERRING PROVIDER: Lacy Duverney MD    PT End of Session - 10/05/21 1258     Visit Number 2    Number of Visits 24    Date for PT Re-Evaluation 12/26/21    Authorization Type 2/10 eval 10/03/21    PT Start Time 1300    PT Stop Time 1344    PT Time Calculation (min) 44 min    Equipment Utilized During Treatment Gait belt    Activity Tolerance Patient tolerated treatment well;Patient limited by fatigue;Patient limited by pain    Behavior During Therapy WFL for tasks assessed/performed             Past Medical History:  Diagnosis Date   Complication of anesthesia    ? seizures after anesthesia    Headache    Migraines    Neurogenic bladder    Renal disorder    Vision abnormalities    Past Surgical History:  Procedure Laterality Date   ANTERIOR CRUCIATE LIGAMENT REPAIR  1997   APPENDECTOMY     COLONOSCOPY WITH PROPOFOL N/A 11/11/2018   Procedure: COLONOSCOPY WITH PROPOFOL;  Surgeon: Christena Deem, MD;  Location[facility] ENDOSCOPY;  Service: Endoscopy;  Laterality: N/A;   CYSTOSCOPY WITH STENT PLACEMENT Right 04/17/2016   Procedure: CYSTOSCOPY WITH STENT PLACEMENT;  Surgeon: Malen Gauze, MD;  Location: [facility] ORS;  Service: Urology;  Laterality: Right;   ESOPHAGOGASTRODUODENOSCOPY (EGD) WITH PROPOFOL N/A 11/11/2018   Procedure: ESOPHAGOGASTRODUODENOSCOPY (EGD) WITH PROPOFOL;  Surgeon: Christena Deem, MD;  Location: [facility] ENDOSCOPY;  Service: Endoscopy;  Laterality: N/A;   EXPLORATORY LAPAROTOMY  1999   KIDNEY STONE SURGERY  04/2016   REVISION UROSTOMY CUTANEOUS     REVISION UROSTOMY CUTANEOUS  01/10/2018   SUPRAPUBIC CATHETER PLACEMENT  08/2017   TONSILLECTOMY     Patient Active Problem List   Diagnosis Date Noted   Seizure (HCC) 11/11/2018   Major depressive disorder,  recurrent episode, moderate (HCC) 02/07/2018   Nephrolithiasis 04/16/2016   Numbness 07/28/2015   Bladder retention 06/23/2015   Abdominal pain 06/04/2015   Dizziness 05/18/2015   Neck pain 05/18/2015   Complicated migraine 04/28/2015   Other fatigue 04/28/2015   Abnormal finding on MRI of brain 04/28/2015   D (diarrhea) 03/29/2015   H/O disease 03/29/2015   Abnormal weight loss 03/29/2015   Muscle weakness (generalized) 03/14/2015   Headache, migraine 03/10/2015    REFERRING DIAG: L hip arthroscopy/ HSP   THERAPY DIAG:  Pain in right hip  Difficulty in walking, not elsewhere classified  Muscle weakness (generalized)  Rationale for Evaluation and Treatment Rehabilitation  PERTINENT HISTORY: Patient is returning to PT s/p surgery R Hip arthroscopy w femoroplasty and labral repair, femoral osteochondroplasty complete capsular closure arthroscopy on 08/19/21. Patient has been seen prior to surgery by this author for her Hereditary Spastic Paraplegia (HSP). PMh includes bilateral Le edema, BLE weakness, constipation, chronic pain, GERD, tremor, HSP, migraine, neurogenic bladder,seizures, depression, migraine, COVID 19, chronic UTI, urostomy.  Saw physician June 8th. Has been starting gentle walking   PRECAUTIONS: fall, R hip   SUBJECTIVE: Patient reports her hip was bothering her more since yesterday. Is feeling very stiff.   PAIN:  Are you having pain? Yes: NPRS scale: 6/10 Pain location: R hip  Pain description: aching Aggravating factors: movement Relieving factors: rest     TODAY'S  TREATMENT:  10/05/21:  Supine: SAQ 10x each LE; x 2 sets  Bridge 10x ; 2 sets  Posterior pelvic tilt 10x TrA activation pressing into LE's 10x 3 second holds Bilateral knee abduction with exhale during adduction for core activation x 10 Sit to stand 5x Walk with w/c follow and close CGA; one seated rest break; ataxic gait mechanics 2x 25 ft   Review HEP: see below   PATIENT  EDUCATION: Education details: exercise technique, protocol Person educated: Patient Education method: Explanation, Demonstration, Tactile cues, and Verbal cues Education comprehension: verbalized understanding, returned demonstration, verbal cues required, and tactile cues required   HOME EXERCISE PROGRAM: Access Code: [email or web address] URL: [email or web address] Date: 10/05/2021 Prepared by: Precious Bard  Exercises - Supine Posterior Pelvic Tilt  - 1 x daily - 7 x weekly - 2 sets - 10 reps - 5 hold - Supine Transversus Abdominis Bracing - Hands on Thighs  - 1 x daily - 7 x weekly - 2 sets - 10 reps - 5 hold - Supine Transversus Abdominis Bracing with Double Leg Fallout  - 1 x daily - 7 x weekly - 2 sets - 10 reps - 5 hold   PT Short Term Goals       PT SHORT TERM GOAL #1   Title Patient will be independent in home exercise program to improve strength/mobility for better functional independence with ADLs.    Baseline 6/19 give next session    Time 6    Period Weeks    Status New    Target Date 11/14/21      PT SHORT TERM GOAL #2   Title Patient will report a reduction in pain to no greater than 6/10 over the prior week to demonstrate symptom improvement.    Baseline 6/19: 10/10 at worst    Time 6    Period Weeks    Status New    Target Date 11/14/21              PT Long Term Goals       PT LONG TERM GOAL #1   Title Patient will increase FOTO score to equal to or greater than  49   to demonstrate statistically significant improvement in mobility and quality of life    Baseline 6/19: 34    Time 12    Period Weeks    Status New    Target Date 12/26/21      PT LONG TERM GOAL #2   Title Patient will increase 10 meter walk test to <30 seconds as to improve gait speed for better community ambulation and to reduce fall risk.    Baseline 6/19: unable to perform; walked 1 min 30 seconds for 1/3 of test.    Time 12    Period Weeks    Status New    Target Date  12/26/21      PT LONG TERM GOAL #3   Title Patient (< 56 years old) will complete five times sit to stand test in < 15 seconds indicating an increased LE strength and improved balance.    Baseline 6/19: 33 seconds heavy BUE support    Time 12    Period Weeks    Status New    Target Date 12/26/21      PT LONG TERM GOAL #4   Title Patient will increase hip strength to 4/5 for functional strength and mobility to increase independence.    Baseline 6/19: flex abd 3/5 add 2+/5    Time  12    Period Weeks    Status New    Target Date 12/26/21      PT LONG TERM GOAL #5   Title Patient will report a worst pain of 3/10 on VAS in R hip  to improve tolerance with ADLs and reduced symptoms with activities.    Baseline 6/19: 10/10    Time 12    Period Weeks    Status New    Target Date 12/26/21              Plan     Clinical Impression Statement Patient presents to physical therapy with excellent motivation. She is limited with ambulation and requires seated rest break due to bilateral knee buckling. Patient educated on HEP for core activation and demonstrates understanding. Her sit to stands are improving with decreased buckling of LE's. Patient will continue to benefit from skilled PT to address gait deficits, standing and mobility tolerance, and strength to improve quality of life and level of independence with functional mobility and ADLs.    Personal Factors and Comorbidities Comorbidity 3+;Transportation;Time since onset of injury/illness/exacerbation;Finances;Past/Current Experience    Comorbidities bilateral Le edema, BLE weakness, constipation, chronic pain, GERD, tremor, HSP, migraine, neurogenic bladder, chronic UTI, urostomy.    Examination-Activity Limitations Caring for Others;Carry;Continence;Squat;Stairs;Toileting;Locomotion Level;Lift;Stand;Bed Mobility;Bend;Reach Overhead;Sit;Dressing;Transfers;Bathing;Hygiene/Grooming;Sleep    Examination-Participation Restrictions  Church;Cleaning;Community Activity;Driving;Interpersonal Relationship;Laundry;Yard Work;Meal Prep;School;Volunteer;Shop    Stability/Clinical Decision Making Unstable/Unpredictable    Rehab Potential Fair    PT Frequency 2x / week    PT Duration 12 weeks    PT Treatment/Interventions ADLs/Self Care Home Management;Aquatic Therapy;Biofeedback;Moist Heat;Electrical Stimulation;Cryotherapy;Traction;Ultrasound;Therapeutic activities;Functional mobility training;Stair training;Gait training;Therapeutic exercise;Balance training;Neuromuscular re-education;Patient/family education;Manual techniques;Dry needling;Scar mobilization;Energy conservation;Taping;Joint Manipulations;Spinal Manipulations;Visual/perceptual remediation/compensation;Passive range of motion;Wheelchair mobility training;DME Instruction;Orthotic Fit/Training;Iontophoresis 4mg /ml Dexamethasone;Vestibular;Canalith Repostioning    PT Next Visit Plan progression per protocol    PT Home Exercise Plan give next session    Consulted and Agree with Plan of Care Patient             , PT, DPT  10/05/2021, 2:39 PM

## 2021-10-06 ENCOUNTER — Ambulatory Visit: Payer: BC Managed Care – PPO | Admitting: Physical Therapy

## 2021-10-06 NOTE — Therapy (Incomplete)
OUTPATIENT PHYSICAL THERAPY TREATMENT NOTE   Patient Name: Christine Chavez MRN: [id number] DOB:02/25/1981, 41 y.o., female Today's Date: 10/06/2021  PCP: Ermalinda Memos MD  REFERRING PROVIDER: Lacy Duverney MD      Past Medical History:  Diagnosis Date   Complication of anesthesia    ? seizures after anesthesia    Headache    Migraines    Neurogenic bladder    Renal disorder    Vision abnormalities    Past Surgical History:  Procedure Laterality Date   ANTERIOR CRUCIATE LIGAMENT REPAIR  1997   APPENDECTOMY     COLONOSCOPY WITH PROPOFOL N/A 11/11/2018   Procedure: COLONOSCOPY WITH PROPOFOL;  Surgeon: Christena Deem, MD;  Location: [facility] ENDOSCOPY;  Service: Endoscopy;  Laterality: N/A;   CYSTOSCOPY WITH STENT PLACEMENT Right 04/17/2016   Procedure: CYSTOSCOPY WITH STENT PLACEMENT;  Surgeon: Malen Gauze, MD;  Location: [facility] ORS;  Service: Urology;  Laterality: Right;   ESOPHAGOGASTRODUODENOSCOPY (EGD) WITH PROPOFOL N/A 11/11/2018   Procedure: ESOPHAGOGASTRODUODENOSCOPY (EGD) WITH PROPOFOL;  Surgeon: Christena Deem, MD;  Location: [facility] ENDOSCOPY;  Service: Endoscopy;  Laterality: N/A;   EXPLORATORY LAPAROTOMY  1999   KIDNEY STONE SURGERY  04/2016   REVISION UROSTOMY CUTANEOUS     REVISION UROSTOMY CUTANEOUS  01/10/2018   SUPRAPUBIC CATHETER PLACEMENT  08/2017   TONSILLECTOMY     Patient Active Problem List   Diagnosis Date Noted   Seizure (HCC) 11/11/2018   Major depressive disorder, recurrent episode, moderate (HCC) 02/07/2018   Nephrolithiasis 04/16/2016   Numbness 07/28/2015   Bladder retention 06/23/2015   Abdominal pain 06/04/2015   Dizziness 05/18/2015   Neck pain 05/18/2015   Complicated migraine 04/28/2015   Other fatigue 04/28/2015   Abnormal finding on MRI of brain 04/28/2015   D (diarrhea) 03/29/2015   H/O disease 03/29/2015   Abnormal weight loss 03/29/2015   Muscle weakness (generalized) 03/14/2015   Headache, migraine 03/10/2015     REFERRING DIAG: L hip arthroscopy/ HSP   THERAPY DIAG:  No diagnosis found.  Rationale for Evaluation and Treatment Rehabilitation  PERTINENT HISTORY: Patient is returning to PT s/p surgery R Hip arthroscopy w femoroplasty and labral repair, femoral osteochondroplasty complete capsular closure arthroscopy on 08/19/21. Patient has been seen prior to surgery by this author for her Hereditary Spastic Paraplegia (HSP). PMh includes bilateral Le edema, BLE weakness, constipation, chronic pain, GERD, tremor, HSP, migraine, neurogenic bladder,seizures, depression, migraine, COVID 19, chronic UTI, urostomy.  Saw physician June 8th. Has been starting gentle walking   PRECAUTIONS: fall, R hip   SUBJECTIVE: ***  PAIN:  Are you having pain? Yes: NPRS scale: 6/10 Pain location: R hip  Pain description: aching Aggravating factors: movement Relieving factors: rest     TODAY'S TREATMENT:    10/05/21:  Supine: SAQ 10x each LE; x 2 sets  Bridge 10x ; 2 sets  Posterior pelvic tilt 10x TrA activation pressing into LE's 10x 3 second holds Bilateral knee abduction with exhale during adduction for core activation x 10 Sit to stand 5x Walk with w/c follow and close CGA; one seated rest break; ataxic gait mechanics 2x 25 ft   Review HEP: see below   PATIENT EDUCATION: Education details: exercise technique, protocol Person educated: Patient Education method: Explanation, Demonstration, Tactile cues, and Verbal cues Education comprehension: verbalized understanding, returned demonstration, verbal cues required, and tactile cues required   HOME EXERCISE PROGRAM: Access Code: [email or web address] URL: [email or web address] Date: 10/05/2021 Prepared by: Precious Bard  Exercises - Supine Posterior Pelvic Tilt  -  1 x daily - 7 x weekly - 2 sets - 10 reps - 5 hold - Supine Transversus Abdominis Bracing - Hands on Thighs  - 1 x daily - 7 x weekly - 2 sets - 10 reps - 5 hold - Supine  Transversus Abdominis Bracing with Double Leg Fallout  - 1 x daily - 7 x weekly - 2 sets - 10 reps - 5 hold   PT Short Term Goals       PT SHORT TERM GOAL #1   Title Patient will be independent in home exercise program to improve strength/mobility for better functional independence with ADLs.    Baseline 6/19 give next session    Time 6    Period Weeks    Status New    Target Date 11/14/21      PT SHORT TERM GOAL #2   Title Patient will report a reduction in pain to no greater than 6/10 over the prior week to demonstrate symptom improvement.    Baseline 6/19: 10/10 at worst    Time 6    Period Weeks    Status New    Target Date 11/14/21              PT Long Term Goals       PT LONG TERM GOAL #1   Title Patient will increase FOTO score to equal to or greater than  49   to demonstrate statistically significant improvement in mobility and quality of life    Baseline 6/19: 34    Time 12    Period Weeks    Status New    Target Date 12/26/21      PT LONG TERM GOAL #2   Title Patient will increase 10 meter walk test to <30 seconds as to improve gait speed for better community ambulation and to reduce fall risk.    Baseline 6/19: unable to perform; walked 1 min 30 seconds for 1/3 of test.    Time 12    Period Weeks    Status New    Target Date 12/26/21      PT LONG TERM GOAL #3   Title Patient (< 46 years old) will complete five times sit to stand test in < 15 seconds indicating an increased LE strength and improved balance.    Baseline 6/19: 33 seconds heavy BUE support    Time 12    Period Weeks    Status New    Target Date 12/26/21      PT LONG TERM GOAL #4   Title Patient will increase hip strength to 4/5 for functional strength and mobility to increase independence.    Baseline 6/19: flex abd 3/5 add 2+/5    Time 12    Period Weeks    Status New    Target Date 12/26/21      PT LONG TERM GOAL #5   Title Patient will report a worst pain of 3/10 on VAS in R  hip  to improve tolerance with ADLs and reduced symptoms with activities.    Baseline 6/19: 10/10    Time 12    Period Weeks    Status New    Target Date 12/26/21              Plan     Clinical Impression Statement ***   Personal Factors and Comorbidities Comorbidity 3+;Transportation;Time since onset of injury/illness/exacerbation;Finances;Past/Current Experience    Comorbidities bilateral Le edema, BLE weakness, constipation, chronic pain, GERD, tremor,  HSP, migraine, neurogenic bladder, chronic UTI, urostomy.    Examination-Activity Limitations Caring for Others;Carry;Continence;Squat;Stairs;Toileting;Locomotion Level;Lift;Stand;Bed Mobility;Bend;Reach Overhead;Sit;Dressing;Transfers;Bathing;Hygiene/Grooming;Sleep    Examination-Participation Restrictions Church;Cleaning;Community Activity;Driving;Interpersonal Relationship;Laundry;Yard Work;Meal Prep;School;Volunteer;Shop    Stability/Clinical Decision Making Unstable/Unpredictable    Rehab Potential Fair    PT Frequency 2x / week    PT Duration 12 weeks    PT Treatment/Interventions ADLs/Self Care Home Management;Aquatic Therapy;Biofeedback;Moist Heat;Electrical Stimulation;Cryotherapy;Traction;Ultrasound;Therapeutic activities;Functional mobility training;Stair training;Gait training;Therapeutic exercise;Balance training;Neuromuscular re-education;Patient/family education;Manual techniques;Dry needling;Scar mobilization;Energy conservation;Taping;Joint Manipulations;Spinal Manipulations;Visual/perceptual remediation/compensation;Passive range of motion;Wheelchair mobility training;DME Instruction;Orthotic Fit/Training;Iontophoresis 4mg /ml Dexamethasone;Vestibular;Canalith Repostioning    PT Next Visit Plan progression per protocol    PT Home Exercise Plan give next session    Consulted and Agree with Plan of Care Patient             , PT, DPT  10/06/2021, 8:31 PM

## 2021-10-10 ENCOUNTER — Ambulatory Visit: Payer: BC Managed Care – PPO

## 2021-10-10 DIAGNOSIS — M25551 Pain in right hip: Secondary | ICD-10-CM

## 2021-10-10 DIAGNOSIS — R262 Difficulty in walking, not elsewhere classified: Secondary | ICD-10-CM

## 2021-10-10 DIAGNOSIS — M6281 Muscle weakness (generalized): Secondary | ICD-10-CM

## 2021-10-12 ENCOUNTER — Ambulatory Visit: Payer: BC Managed Care – PPO

## 2021-10-13 ENCOUNTER — Ambulatory Visit: Payer: BC Managed Care – PPO | Admitting: Physical Therapy

## 2021-10-17 ENCOUNTER — Ambulatory Visit: Payer: BC Managed Care – PPO | Attending: Gastroenterology

## 2021-10-17 DIAGNOSIS — M6281 Muscle weakness (generalized): Secondary | ICD-10-CM | POA: Insufficient documentation

## 2021-10-17 DIAGNOSIS — M62838 Other muscle spasm: Secondary | ICD-10-CM | POA: Insufficient documentation

## 2021-10-17 DIAGNOSIS — M25551 Pain in right hip: Secondary | ICD-10-CM | POA: Diagnosis not present

## 2021-10-17 DIAGNOSIS — R262 Difficulty in walking, not elsewhere classified: Secondary | ICD-10-CM | POA: Diagnosis present

## 2021-10-17 NOTE — Therapy (Signed)
OUTPATIENT PHYSICAL THERAPY TREATMENT NOTE   Patient Name: Christine Chavez MRN: [id number] DOB:09-Feb-1981, 41 y.o., female Today's Date: 10/20/2021  PCP: Ermalinda Memos MD  REFERRING PROVIDER: Lacy Duverney MD    PT End of Session - 10/20/21 2119     Visit Number 5    Number of Visits 24    Date for PT Re-Evaluation 12/26/21    Authorization Type 5/10 eval 10/03/21    PT Start Time 1645    PT Stop Time 1730    PT Time Calculation (min) 45 min    Equipment Utilized During Treatment Gait belt    Activity Tolerance Patient tolerated treatment well;Patient limited by fatigue;Patient limited by pain    Behavior During Therapy WFL for tasks assessed/performed                Past Medical History:  Diagnosis Date   Complication of anesthesia    ? seizures after anesthesia    Headache    Migraines    Neurogenic bladder    Renal disorder    Vision abnormalities    Past Surgical History:  Procedure Laterality Date   ANTERIOR CRUCIATE LIGAMENT REPAIR  1997   APPENDECTOMY     COLONOSCOPY WITH PROPOFOL N/A 11/11/2018   Procedure: COLONOSCOPY WITH PROPOFOL;  Surgeon: Christena Deem, MD;  Location: [facility] ENDOSCOPY;  Service: Endoscopy;  Laterality: N/A;   CYSTOSCOPY WITH STENT PLACEMENT Right 04/17/2016   Procedure: CYSTOSCOPY WITH STENT PLACEMENT;  Surgeon: Malen Gauze, MD;  Location: [facility] ORS;  Service: Urology;  Laterality: Right;   ESOPHAGOGASTRODUODENOSCOPY (EGD) WITH PROPOFOL N/A 11/11/2018   Procedure: ESOPHAGOGASTRODUODENOSCOPY (EGD) WITH PROPOFOL;  Surgeon: Christena Deem, MD;  Location: [facility] ENDOSCOPY;  Service: Endoscopy;  Laterality: N/A;   EXPLORATORY LAPAROTOMY  1999   KIDNEY STONE SURGERY  04/2016   REVISION UROSTOMY CUTANEOUS     REVISION UROSTOMY CUTANEOUS  01/10/2018   SUPRAPUBIC CATHETER PLACEMENT  08/2017   TONSILLECTOMY     Patient Active Problem List   Diagnosis Date Noted   Seizure (HCC) 11/11/2018   Major depressive disorder,  recurrent episode, moderate (HCC) 02/07/2018   Nephrolithiasis 04/16/2016   Numbness 07/28/2015   Bladder retention 06/23/2015   Abdominal pain 06/04/2015   Dizziness 05/18/2015   Neck pain 05/18/2015   Complicated migraine 04/28/2015   Other fatigue 04/28/2015   Abnormal finding on MRI of brain 04/28/2015   D (diarrhea) 03/29/2015   H/O disease 03/29/2015   Abnormal weight loss 03/29/2015   Muscle weakness (generalized) 03/14/2015   Headache, migraine 03/10/2015    REFERRING DIAG: L hip arthroscopy/ HSP   THERAPY DIAG:  Pain in right hip  Difficulty in walking, not elsewhere classified  Muscle weakness (generalized)  Rationale for Evaluation and Treatment Rehabilitation  PERTINENT HISTORY: Patient is returning to PT s/p surgery R Hip arthroscopy w femoroplasty and labral repair, femoral osteochondroplasty complete capsular closure arthroscopy on 08/19/21. Patient has been seen prior to surgery by this author for her Hereditary Spastic Paraplegia (HSP). PMh includes bilateral Le edema, BLE weakness, constipation, chronic pain, GERD, tremor, HSP, migraine, neurogenic bladder,seizures, depression, migraine, COVID 19, chronic UTI, urostomy.  Saw physician June 8th. Has been starting gentle walking   PRECAUTIONS: fall, R hip   SUBJECTIVE: Patient presents with husband. Has been compliant with HEP but is having back pain and fatigue from it being a later day session.   PAIN:  Are you having pain? Yes: NPRS scale: 6/10 Pain location: R hip and low back  Pain description: aching Aggravating factors: movement Relieving factors: rest     TODAY'S TREATMENT:    10/20/21:  Supine: Bridge 10x ; 2 sets  Posterior pelvic tilt 10x Bent knee fallout with adduction exhale for TrA activation 10x each LE Bilateral knee abduction BTB 15x LE rotation for low back pain reduction 30 seconds x 3 trials  Standing in // bars: -4" step toe taps 10x each LE; stabilization provided to knee due  to excessive knee buckling -march into GTB 12x each LE; stabilization to knee due to excessive knee buckling -hedgehog tap with heavy BUE support ; three hedgehogs ; seated rest breaks between each LE  PATIENT EDUCATION: Education details: exercise technique, protocol Person educated: Patient Education method: Explanation, Demonstration, Tactile cues, and Verbal cues Education comprehension: verbalized understanding, returned demonstration, verbal cues required, and tactile cues required   HOME EXERCISE PROGRAM: Access Code: [email or web address] URL: [email or web address] Date: 10/05/2021 Prepared by: Precious Bard  Exercises - Supine Posterior Pelvic Tilt  - 1 x daily - 7 x weekly - 2 sets - 10 reps - 5 hold - Supine Transversus Abdominis Bracing - Hands on Thighs  - 1 x daily - 7 x weekly - 2 sets - 10 reps - 5 hold - Supine Transversus Abdominis Bracing with Double Leg Fallout  - 1 x daily - 7 x weekly - 2 sets - 10 reps - 5 hold   PT Short Term Goals       PT SHORT TERM GOAL #1   Title Patient will be independent in home exercise program to improve strength/mobility for better functional independence with ADLs.    Baseline 6/19 give next session    Time 6    Period Weeks    Status New    Target Date 11/14/21      PT SHORT TERM GOAL #2   Title Patient will report a reduction in pain to no greater than 6/10 over the prior week to demonstrate symptom improvement.    Baseline 6/19: 10/10 at worst    Time 6    Period Weeks    Status New    Target Date 11/14/21              PT Long Term Goals       PT LONG TERM GOAL #1   Title Patient will increase FOTO score to equal to or greater than  49   to demonstrate statistically significant improvement in mobility and quality of life    Baseline 6/19: 34    Time 12    Period Weeks    Status New    Target Date 12/26/21      PT LONG TERM GOAL #2   Title Patient will increase 10 meter walk test to <30 seconds as to  improve gait speed for better community ambulation and to reduce fall risk.    Baseline 6/19: unable to perform; walked 1 min 30 seconds for 1/3 of test.    Time 12    Period Weeks    Status New    Target Date 12/26/21      PT LONG TERM GOAL #3   Title Patient (< 30 years old) will complete five times sit to stand test in < 15 seconds indicating an increased LE strength and improved balance.    Baseline 6/19: 33 seconds heavy BUE support    Time 12    Period Weeks    Status New    Target Date 12/26/21  PT LONG TERM GOAL #4   Title Patient will increase hip strength to 4/5 for functional strength and mobility to increase independence.    Baseline 6/19: flex abd 3/5 add 2+/5    Time 12    Period Weeks    Status New    Target Date 12/26/21      PT LONG TERM GOAL #5   Title Patient will report a worst pain of 3/10 on VAS in R hip  to improve tolerance with ADLs and reduced symptoms with activities.    Baseline 6/19: 10/10    Time 12    Period Weeks    Status New    Target Date 12/26/21              Plan     Clinical Impression Statement Patient is more fatigued due to the later session time. She does have extensive knee buckling in standing with L>RLE. Patient does require BUE support in standing. She is highly motivated for progression of care.  Patient will continue to benefit from skilled PT to address gait deficits, standing and mobility tolerance, and strength to improve quality of life and level of independence with functional mobility and ADLs.   Personal Factors and Comorbidities Comorbidity 3+;Transportation;Time since onset of injury/illness/exacerbation;Finances;Past/Current Experience    Comorbidities bilateral Le edema, BLE weakness, constipation, chronic pain, GERD, tremor, HSP, migraine, neurogenic bladder, chronic UTI, urostomy.    Examination-Activity Limitations Caring for Others;Carry;Continence;Squat;Stairs;Toileting;Locomotion Level;Lift;Stand;Bed  Mobility;Bend;Reach Overhead;Sit;Dressing;Transfers;Bathing;Hygiene/Grooming;Sleep    Examination-Participation Restrictions Church;Cleaning;Community Activity;Driving;Interpersonal Relationship;Laundry;Yard Work;Meal Prep;School;Volunteer;Shop    Stability/Clinical Decision Making Unstable/Unpredictable    Rehab Potential Fair    PT Frequency 2x / week    PT Duration 12 weeks    PT Treatment/Interventions ADLs/Self Care Home Management;Aquatic Therapy;Biofeedback;Moist Heat;Electrical Stimulation;Cryotherapy;Traction;Ultrasound;Therapeutic activities;Functional mobility training;Stair training;Gait training;Therapeutic exercise;Balance training;Neuromuscular re-education;Patient/family education;Manual techniques;Dry needling;Scar mobilization;Energy conservation;Taping;Joint Manipulations;Spinal Manipulations;Visual/perceptual remediation/compensation;Passive range of motion;Wheelchair mobility training;DME Instruction;Orthotic Fit/Training;Iontophoresis 4mg /ml Dexamethasone;Vestibular;Canalith Repostioning    PT Next Visit Plan progression per protocol    PT Home Exercise Plan give next session    Consulted and Agree with Plan of Care Patient             , PT, DPT  10/20/2021, 9:25 AM

## 2021-10-17 NOTE — Therapy (Signed)
OUTPATIENT PHYSICAL THERAPY TREATMENT NOTE   Patient Name: Christine Chavez MRN: [id number] DOB:1981/01/05, 41 y.o., female Today's Date: 10/17/2021  PCP: Ermalinda Memos MD  REFERRING PROVIDER: Lacy Duverney MD    PT End of Session - 10/17/21 1216     Visit Number 4    Number of Visits 24    Date for PT Re-Evaluation 12/26/21    Authorization Type 4/10 eval 10/03/21    PT Start Time 1100    PT Stop Time 1144    PT Time Calculation (min) 44 min    Equipment Utilized During Treatment Gait belt    Activity Tolerance Patient tolerated treatment well;Patient limited by fatigue;Patient limited by pain    Behavior During Therapy WFL for tasks assessed/performed               Past Medical History:  Diagnosis Date   Complication of anesthesia    ? seizures after anesthesia    Headache    Migraines    Neurogenic bladder    Renal disorder    Vision abnormalities    Past Surgical History:  Procedure Laterality Date   ANTERIOR CRUCIATE LIGAMENT REPAIR  1997   APPENDECTOMY     COLONOSCOPY WITH PROPOFOL N/A 11/11/2018   Procedure: COLONOSCOPY WITH PROPOFOL;  Surgeon: Christena Deem, MD;  Location: [facility] ENDOSCOPY;  Service: Endoscopy;  Laterality: N/A;   CYSTOSCOPY WITH STENT PLACEMENT Right 04/17/2016   Procedure: CYSTOSCOPY WITH STENT PLACEMENT;  Surgeon: Malen Gauze, MD;  Location: [facility] ORS;  Service: Urology;  Laterality: Right;   ESOPHAGOGASTRODUODENOSCOPY (EGD) WITH PROPOFOL N/A 11/11/2018   Procedure: ESOPHAGOGASTRODUODENOSCOPY (EGD) WITH PROPOFOL;  Surgeon: Christena Deem, MD;  Location: [facility] ENDOSCOPY;  Service: Endoscopy;  Laterality: N/A;   EXPLORATORY LAPAROTOMY  1999   KIDNEY STONE SURGERY  04/2016   REVISION UROSTOMY CUTANEOUS     REVISION UROSTOMY CUTANEOUS  01/10/2018   SUPRAPUBIC CATHETER PLACEMENT  08/2017   TONSILLECTOMY     Patient Active Problem List   Diagnosis Date Noted   Seizure (HCC) 11/11/2018   Major depressive disorder,  recurrent episode, moderate (HCC) 02/07/2018   Nephrolithiasis 04/16/2016   Numbness 07/28/2015   Bladder retention 06/23/2015   Abdominal pain 06/04/2015   Dizziness 05/18/2015   Neck pain 05/18/2015   Complicated migraine 04/28/2015   Other fatigue 04/28/2015   Abnormal finding on MRI of brain 04/28/2015   D (diarrhea) 03/29/2015   H/O disease 03/29/2015   Abnormal weight loss 03/29/2015   Muscle weakness (generalized) 03/14/2015   Headache, migraine 03/10/2015    REFERRING DIAG: L hip arthroscopy/ HSP   THERAPY DIAG:  Pain in right hip  Difficulty in walking, not elsewhere classified  Muscle weakness (generalized)  Other muscle spasm  Rationale for Evaluation and Treatment Rehabilitation  PERTINENT HISTORY: Patient is returning to PT s/p surgery R Hip arthroscopy w femoroplasty and labral repair, femoral osteochondroplasty complete capsular closure arthroscopy on 08/19/21. Patient has been seen prior to surgery by this author for her Hereditary Spastic Paraplegia (HSP). PMh includes bilateral Le edema, BLE weakness, constipation, chronic pain, GERD, tremor, HSP, migraine, neurogenic bladder,seizures, depression, migraine, COVID 19, chronic UTI, urostomy.  Saw physician June 8th. Has been starting gentle walking   PRECAUTIONS: fall, R hip   SUBJECTIVE: Patient is continuing to have complications with her bladder. Is having low back pain today in combination to hip pain  PAIN:  Are you having pain? Yes: NPRS scale: 6/10 Pain location: R hip and low back Pain  description: aching Aggravating factors: movement Relieving factors: rest     TODAY'S TREATMENT:    10/17/21:  Supine: SAQ 10x each LE; x 2 sets ; #2 ankle weights Bridge 10x ; 2 sets  Posterior pelvic tilt 10x Bilateral knee abduction BTB 15x BTB march with overhead PVC pipe raise 15x each LE  LE rotation for low back pain reduction 30 seconds x 3 trials  Standing holding onto PT shoulders for UE  support -lateral stepping 6x length of // bars -forward/backwards walking 4x length of // bars -marching 12x each LE   PATIENT EDUCATION: Education details: exercise technique, protocol Person educated: Patient Education method: Explanation, Demonstration, Tactile cues, and Verbal cues Education comprehension: verbalized understanding, returned demonstration, verbal cues required, and tactile cues required   HOME EXERCISE PROGRAM: Access Code: [email or web address] URL: [email or web address] Date: 10/05/2021 Prepared by: Precious Bard  Exercises - Supine Posterior Pelvic Tilt  - 1 x daily - 7 x weekly - 2 sets - 10 reps - 5 hold - Supine Transversus Abdominis Bracing - Hands on Thighs  - 1 x daily - 7 x weekly - 2 sets - 10 reps - 5 hold - Supine Transversus Abdominis Bracing with Double Leg Fallout  - 1 x daily - 7 x weekly - 2 sets - 10 reps - 5 hold   PT Short Term Goals       PT SHORT TERM GOAL #1   Title Patient will be independent in home exercise program to improve strength/mobility for better functional independence with ADLs.    Baseline 6/19 give next session    Time 6    Period Weeks    Status New    Target Date 11/14/21      PT SHORT TERM GOAL #2   Title Patient will report a reduction in pain to no greater than 6/10 over the prior week to demonstrate symptom improvement.    Baseline 6/19: 10/10 at worst    Time 6    Period Weeks    Status New    Target Date 11/14/21              PT Long Term Goals       PT LONG TERM GOAL #1   Title Patient will increase FOTO score to equal to or greater than  49   to demonstrate statistically significant improvement in mobility and quality of life    Baseline 6/19: 34    Time 12    Period Weeks    Status New    Target Date 12/26/21      PT LONG TERM GOAL #2   Title Patient will increase 10 meter walk test to <30 seconds as to improve gait speed for better community ambulation and to reduce fall risk.     Baseline 6/19: unable to perform; walked 1 min 30 seconds for 1/3 of test.    Time 12    Period Weeks    Status New    Target Date 12/26/21      PT LONG TERM GOAL #3   Title Patient (< 69 years old) will complete five times sit to stand test in < 15 seconds indicating an increased LE strength and improved balance.    Baseline 6/19: 33 seconds heavy BUE support    Time 12    Period Weeks    Status New    Target Date 12/26/21      PT LONG TERM GOAL #4   Title Patient will increase hip  strength to 4/5 for functional strength and mobility to increase independence.    Baseline 6/19: flex abd 3/5 add 2+/5    Time 12    Period Weeks    Status New    Target Date 12/26/21      PT LONG TERM GOAL #5   Title Patient will report a worst pain of 3/10 on VAS in R hip  to improve tolerance with ADLs and reduced symptoms with activities.    Baseline 6/19: 10/10    Time 12    Period Weeks    Status New    Target Date 12/26/21              Plan     Clinical Impression Statement Patient is highly motivated throughout session. She is able to perform standing tasks with occasional buckling of L knee. She does tolerate use of weights and increased band resistance without pain increase.  Patient will continue to benefit from skilled PT to address gait deficits, standing and mobility tolerance, and strength to improve quality of life and level of independence with functional mobility and ADLs.   Personal Factors and Comorbidities Comorbidity 3+;Transportation;Time since onset of injury/illness/exacerbation;Finances;Past/Current Experience    Comorbidities bilateral Le edema, BLE weakness, constipation, chronic pain, GERD, tremor, HSP, migraine, neurogenic bladder, chronic UTI, urostomy.    Examination-Activity Limitations Caring for Others;Carry;Continence;Squat;Stairs;Toileting;Locomotion Level;Lift;Stand;Bed Mobility;Bend;Reach Overhead;Sit;Dressing;Transfers;Bathing;Hygiene/Grooming;Sleep     Examination-Participation Restrictions Church;Cleaning;Community Activity;Driving;Interpersonal Relationship;Laundry;Yard Work;Meal Prep;School;Volunteer;Shop    Stability/Clinical Decision Making Unstable/Unpredictable    Rehab Potential Fair    PT Frequency 2x / week    PT Duration 12 weeks    PT Treatment/Interventions ADLs/Self Care Home Management;Aquatic Therapy;Biofeedback;Moist Heat;Electrical Stimulation;Cryotherapy;Traction;Ultrasound;Therapeutic activities;Functional mobility training;Stair training;Gait training;Therapeutic exercise;Balance training;Neuromuscular re-education;Patient/family education;Manual techniques;Dry needling;Scar mobilization;Energy conservation;Taping;Joint Manipulations;Spinal Manipulations;Visual/perceptual remediation/compensation;Passive range of motion;Wheelchair mobility training;DME Instruction;Orthotic Fit/Training;Iontophoresis 4mg /ml Dexamethasone;Vestibular;Canalith Repostioning    PT Next Visit Plan progression per protocol    PT Home Exercise Plan give next session    Consulted and Agree with Plan of Care Patient             , PT, DPT  10/17/2021, 12:24 PM

## 2021-10-19 ENCOUNTER — Ambulatory Visit: Payer: BC Managed Care – PPO

## 2021-10-19 DIAGNOSIS — M6281 Muscle weakness (generalized): Secondary | ICD-10-CM

## 2021-10-19 DIAGNOSIS — R262 Difficulty in walking, not elsewhere classified: Secondary | ICD-10-CM

## 2021-10-19 DIAGNOSIS — M25551 Pain in right hip: Secondary | ICD-10-CM | POA: Diagnosis not present

## 2021-10-20 ENCOUNTER — Ambulatory Visit: Payer: BC Managed Care – PPO | Admitting: Physical Therapy

## 2021-10-20 NOTE — Therapy (Signed)
OUTPATIENT PHYSICAL THERAPY TREATMENT NOTE   Patient Name: Christine Chavez MRN: [id number] DOB:07-20-1980, 41 y.o., female Today's Date: 10/24/2021  PCP: Ermalinda Memos MD  REFERRING PROVIDER: Lacy Duverney MD    PT End of Session - 10/24/21 1100     Visit Number 6    Number of Visits 24    Date for PT Re-Evaluation 12/26/21    Authorization Type 6/10 eval 10/03/21    PT Start Time 1100    PT Stop Time 1145    PT Time Calculation (min) 45 min    Equipment Utilized During Treatment Gait belt    Activity Tolerance Patient tolerated treatment well;Patient limited by fatigue;Patient limited by pain    Behavior During Therapy WFL for tasks assessed/performed                 Past Medical History:  Diagnosis Date   Complication of anesthesia    ? seizures after anesthesia    Headache    Migraines    Neurogenic bladder    Renal disorder    Vision abnormalities    Past Surgical History:  Procedure Laterality Date   ANTERIOR CRUCIATE LIGAMENT REPAIR  1997   APPENDECTOMY     COLONOSCOPY WITH PROPOFOL N/A 11/11/2018   Procedure: COLONOSCOPY WITH PROPOFOL;  Surgeon: Christena Deem, MD;  Location: [facility] ENDOSCOPY;  Service: Endoscopy;  Laterality: N/A;   CYSTOSCOPY WITH STENT PLACEMENT Right 04/17/2016   Procedure: CYSTOSCOPY WITH STENT PLACEMENT;  Surgeon: Malen Gauze, MD;  Location: [facility] ORS;  Service: Urology;  Laterality: Right;   ESOPHAGOGASTRODUODENOSCOPY (EGD) WITH PROPOFOL N/A 11/11/2018   Procedure: ESOPHAGOGASTRODUODENOSCOPY (EGD) WITH PROPOFOL;  Surgeon: Christena Deem, MD;  Location: [facility] ENDOSCOPY;  Service: Endoscopy;  Laterality: N/A;   EXPLORATORY LAPAROTOMY  1999   KIDNEY STONE SURGERY  04/2016   REVISION UROSTOMY CUTANEOUS     REVISION UROSTOMY CUTANEOUS  01/10/2018   SUPRAPUBIC CATHETER PLACEMENT  08/2017   TONSILLECTOMY     Patient Active Problem List   Diagnosis Date Noted   Seizure (HCC) 11/11/2018   Major depressive disorder,  recurrent episode, moderate (HCC) 02/07/2018   Nephrolithiasis 04/16/2016   Numbness 07/28/2015   Bladder retention 06/23/2015   Abdominal pain 06/04/2015   Dizziness 05/18/2015   Neck pain 05/18/2015   Complicated migraine 04/28/2015   Other fatigue 04/28/2015   Abnormal finding on MRI of brain 04/28/2015   D (diarrhea) 03/29/2015   H/O disease 03/29/2015   Abnormal weight loss 03/29/2015   Muscle weakness (generalized) 03/14/2015   Headache, migraine 03/10/2015    REFERRING DIAG: L hip arthroscopy/ HSP   THERAPY DIAG:  Pain in right hip  Difficulty in walking, not elsewhere classified  Muscle weakness (generalized)  Rationale for Evaluation and Treatment Rehabilitation  PERTINENT HISTORY: Patient is returning to PT s/p surgery R Hip arthroscopy w femoroplasty and labral repair, femoral osteochondroplasty complete capsular closure arthroscopy on 08/19/21. Patient has been seen prior to surgery by this author for her Hereditary Spastic Paraplegia (HSP). PMh includes bilateral Le edema, BLE weakness, constipation, chronic pain, GERD, tremor, HSP, migraine, neurogenic bladder,seizures, depression, migraine, COVID 19, chronic UTI, urostomy.  Saw physician June 8th. Has been starting gentle walking   PRECAUTIONS: fall, R hip   SUBJECTIVE: Patient reports increased nausea and tingling across body.   PAIN:  Are you having pain? Yes: NPRS scale: 6/10 Pain location: R hip and low back Pain description: aching Aggravating factors: movement Relieving factors: rest     TODAY'S  TREATMENT:      Supine: Bridge 10x ; 2 sets  BTB hip flexion 15x each LE Bilateral knee abduction BTB 15x SLR 10x AAROM each LE Heel slides 10x each LE LE rotation for low back pain reduction 30 seconds x 3 trials  Standing by support bar -airex pad: draw object on board for dual task and stabilization with reaching x4 minutes -sit to stand with UE support 10x -heel raises with BUE support 10x    PATIENT EDUCATION: Education details: exercise technique, protocol Person educated: Patient Education method: Explanation, Demonstration, Tactile cues, and Verbal cues Education comprehension: verbalized understanding, returned demonstration, verbal cues required, and tactile cues required   HOME EXERCISE PROGRAM: Access Code: [email or web address] URL: [email or web address] Date: 10/05/2021 Prepared by: Precious Bard  Exercises - Supine Posterior Pelvic Tilt  - 1 x daily - 7 x weekly - 2 sets - 10 reps - 5 hold - Supine Transversus Abdominis Bracing - Hands on Thighs  - 1 x daily - 7 x weekly - 2 sets - 10 reps - 5 hold - Supine Transversus Abdominis Bracing with Double Leg Fallout  - 1 x daily - 7 x weekly - 2 sets - 10 reps - 5 hold   PT Short Term Goals       PT SHORT TERM GOAL #1   Title Patient will be independent in home exercise program to improve strength/mobility for better functional independence with ADLs.    Baseline 6/19 give next session    Time 6    Period Weeks    Status New    Target Date 11/14/21      PT SHORT TERM GOAL #2   Title Patient will report a reduction in pain to no greater than 6/10 over the prior week to demonstrate symptom improvement.    Baseline 6/19: 10/10 at worst    Time 6    Period Weeks    Status New    Target Date 11/14/21              PT Long Term Goals       PT LONG TERM GOAL #1   Title Patient will increase FOTO score to equal to or greater than  49   to demonstrate statistically significant improvement in mobility and quality of life    Baseline 6/19: 34    Time 12    Period Weeks    Status New    Target Date 12/26/21      PT LONG TERM GOAL #2   Title Patient will increase 10 meter walk test to <30 seconds as to improve gait speed for better community ambulation and to reduce fall risk.    Baseline 6/19: unable to perform; walked 1 min 30 seconds for 1/3 of test.    Time 12    Period Weeks    Status New     Target Date 12/26/21      PT LONG TERM GOAL #3   Title Patient (< 36 years old) will complete five times sit to stand test in < 15 seconds indicating an increased LE strength and improved balance.    Baseline 6/19: 33 seconds heavy BUE support    Time 12    Period Weeks    Status New    Target Date 12/26/21      PT LONG TERM GOAL #4   Title Patient will increase hip strength to 4/5 for functional strength and mobility to increase independence.    Baseline 6/19: flex  abd 3/5 add 2+/5    Time 12    Period Weeks    Status New    Target Date 12/26/21      PT LONG TERM GOAL #5   Title Patient will report a worst pain of 3/10 on VAS in R hip  to improve tolerance with ADLs and reduced symptoms with activities.    Baseline 6/19: 10/10    Time 12    Period Weeks    Status New    Target Date 12/26/21              Plan     Clinical Impression Statement Patient presents with excellent motivation despite migraine and unsteadiness. Patient does have intermittent knee buckling in standing with increased dizziness in standing.  Patient will continue to benefit from skilled PT to address gait deficits, standing and mobility tolerance, and strength to improve quality of life and level of independence with functional mobility and ADLs.   Personal Factors and Comorbidities Comorbidity 3+;Transportation;Time since onset of injury/illness/exacerbation;Finances;Past/Current Experience    Comorbidities bilateral Le edema, BLE weakness, constipation, chronic pain, GERD, tremor, HSP, migraine, neurogenic bladder, chronic UTI, urostomy.    Examination-Activity Limitations Caring for Others;Carry;Continence;Squat;Stairs;Toileting;Locomotion Level;Lift;Stand;Bed Mobility;Bend;Reach Overhead;Sit;Dressing;Transfers;Bathing;Hygiene/Grooming;Sleep    Examination-Participation Restrictions Church;Cleaning;Community Activity;Driving;Interpersonal Relationship;Laundry;Yard Work;Meal Prep;School;Volunteer;Shop     Stability/Clinical Decision Making Unstable/Unpredictable    Rehab Potential Fair    PT Frequency 2x / week    PT Duration 12 weeks    PT Treatment/Interventions ADLs/Self Care Home Management;Aquatic Therapy;Biofeedback;Moist Heat;Electrical Stimulation;Cryotherapy;Traction;Ultrasound;Therapeutic activities;Functional mobility training;Stair training;Gait training;Therapeutic exercise;Balance training;Neuromuscular re-education;Patient/family education;Manual techniques;Dry needling;Scar mobilization;Energy conservation;Taping;Joint Manipulations;Spinal Manipulations;Visual/perceptual remediation/compensation;Passive range of motion;Wheelchair mobility training;DME Instruction;Orthotic Fit/Training;Iontophoresis 4mg /ml Dexamethasone;Vestibular;Canalith Repostioning    PT Next Visit Plan progression per protocol    PT Home Exercise Plan give next session    Consulted and Agree with Plan of Care Patient             , PT, DPT  10/24/2021, 12:19 PM

## 2021-10-24 ENCOUNTER — Ambulatory Visit: Payer: BC Managed Care – PPO

## 2021-10-24 DIAGNOSIS — R262 Difficulty in walking, not elsewhere classified: Secondary | ICD-10-CM

## 2021-10-24 DIAGNOSIS — M25551 Pain in right hip: Secondary | ICD-10-CM

## 2021-10-24 DIAGNOSIS — M6281 Muscle weakness (generalized): Secondary | ICD-10-CM

## 2021-10-25 NOTE — Therapy (Signed)
OUTPATIENT PHYSICAL THERAPY TREATMENT NOTE   Patient Name: Christine Chavez MRN: [id number] DOB:10/06/1980, 41 y.o., female Today's Date: 10/26/2021  PCP: Ermalinda Memos MD  REFERRING PROVIDER: Lacy Duverney MD    PT End of Session - 10/26/21 1442     Visit Number 7    Number of Visits 24    Date for PT Re-Evaluation 12/26/21    Authorization Type 7/10 eval 10/03/21    PT Start Time 1430    PT Stop Time 1514    PT Time Calculation (min) 44 min    Equipment Utilized During Treatment Gait belt    Activity Tolerance Patient tolerated treatment well;Patient limited by fatigue;Patient limited by pain    Behavior During Therapy WFL for tasks assessed/performed                  Past Medical History:  Diagnosis Date   Complication of anesthesia    ? seizures after anesthesia    Headache    Migraines    Neurogenic bladder    Renal disorder    Vision abnormalities    Past Surgical History:  Procedure Laterality Date   ANTERIOR CRUCIATE LIGAMENT REPAIR  1997   APPENDECTOMY     COLONOSCOPY WITH PROPOFOL N/A 11/11/2018   Procedure: COLONOSCOPY WITH PROPOFOL;  Surgeon: Christena Deem, MD;  Location: [facility] ENDOSCOPY;  Service: Endoscopy;  Laterality: N/A;   CYSTOSCOPY WITH STENT PLACEMENT Right 04/17/2016   Procedure: CYSTOSCOPY WITH STENT PLACEMENT;  Surgeon: Malen Gauze, MD;  Location: [facility] ORS;  Service: Urology;  Laterality: Right;   ESOPHAGOGASTRODUODENOSCOPY (EGD) WITH PROPOFOL N/A 11/11/2018   Procedure: ESOPHAGOGASTRODUODENOSCOPY (EGD) WITH PROPOFOL;  Surgeon: Christena Deem, MD;  Location: [facility] ENDOSCOPY;  Service: Endoscopy;  Laterality: N/A;   EXPLORATORY LAPAROTOMY  1999   KIDNEY STONE SURGERY  04/2016   REVISION UROSTOMY CUTANEOUS     REVISION UROSTOMY CUTANEOUS  01/10/2018   SUPRAPUBIC CATHETER PLACEMENT  08/2017   TONSILLECTOMY     Patient Active Problem List   Diagnosis Date Noted   Seizure (HCC) 11/11/2018   Major depressive  disorder, recurrent episode, moderate (HCC) 02/07/2018   Nephrolithiasis 04/16/2016   Numbness 07/28/2015   Bladder retention 06/23/2015   Abdominal pain 06/04/2015   Dizziness 05/18/2015   Neck pain 05/18/2015   Complicated migraine 04/28/2015   Other fatigue 04/28/2015   Abnormal finding on MRI of brain 04/28/2015   D (diarrhea) 03/29/2015   H/O disease 03/29/2015   Abnormal weight loss 03/29/2015   Muscle weakness (generalized) 03/14/2015   Headache, migraine 03/10/2015    REFERRING DIAG: L hip arthroscopy/ HSP   THERAPY DIAG:  Pain in right hip  Difficulty in walking, not elsewhere classified  Muscle weakness (generalized)  Rationale for Evaluation and Treatment Rehabilitation  PERTINENT HISTORY: Patient is returning to PT s/p surgery R Hip arthroscopy w femoroplasty and labral repair, femoral osteochondroplasty complete capsular closure arthroscopy on 08/19/21. Patient has been seen prior to surgery by this author for her Hereditary Spastic Paraplegia (HSP). PMh includes bilateral Le edema, BLE weakness, constipation, chronic pain, GERD, tremor, HSP, migraine, neurogenic bladder,seizures, depression, migraine, COVID 19, chronic UTI, urostomy.  Saw physician June 8th. Has been starting gentle walking   PRECAUTIONS: fall, R hip   SUBJECTIVE: Patient is very fatigued from having another doctor appointment this morning.Marland Kitchen   PAIN:  Are you having pain? Yes: NPRS scale: 6/10 Pain location: R hip and low back Pain description: aching Aggravating factors: movement Relieving factors: rest  TODAY'S TREATMENT:      Supine: Bridge 10x ; 2 sets ; second set with adduction squeeze  GTB hip flexion 20x each LE Bilateral knee abduction GTB 20x SLR 10x AAROM each LE Heel slides 10x each LE Adduction squeeze 15x with ball LE rotation for low back pain reduction 30 seconds x 3 trials Posterior pelvic tilt 15x 3 second holds Seated: -LAQ 15x each LE    SPT x 2  trials  Standing by support bar -standing: excessive buckling and extreme fatigue  PATIENT EDUCATION: Education details: exercise technique, protocol Person educated: Patient Education method: Explanation, Demonstration, Tactile cues, and Verbal cues Education comprehension: verbalized understanding, returned demonstration, verbal cues required, and tactile cues required   HOME EXERCISE PROGRAM: Access Code: [email or web address] URL: [email or web address] Date: 10/05/2021 Prepared by: Precious Bard  Exercises - Supine Posterior Pelvic Tilt  - 1 x daily - 7 x weekly - 2 sets - 10 reps - 5 hold - Supine Transversus Abdominis Bracing - Hands on Thighs  - 1 x daily - 7 x weekly - 2 sets - 10 reps - 5 hold - Supine Transversus Abdominis Bracing with Double Leg Fallout  - 1 x daily - 7 x weekly - 2 sets - 10 reps - 5 hold   PT Short Term Goals       PT SHORT TERM GOAL #1   Title Patient will be independent in home exercise program to improve strength/mobility for better functional independence with ADLs.    Baseline 6/19 give next session    Time 6    Period Weeks    Status New    Target Date 11/14/21      PT SHORT TERM GOAL #2   Title Patient will report a reduction in pain to no greater than 6/10 over the prior week to demonstrate symptom improvement.    Baseline 6/19: 10/10 at worst    Time 6    Period Weeks    Status New    Target Date 11/14/21              PT Long Term Goals       PT LONG TERM GOAL #1   Title Patient will increase FOTO score to equal to or greater than  49   to demonstrate statistically significant improvement in mobility and quality of life    Baseline 6/19: 34    Time 12    Period Weeks    Status New    Target Date 12/26/21      PT LONG TERM GOAL #2   Title Patient will increase 10 meter walk test to <30 seconds as to improve gait speed for better community ambulation and to reduce fall risk.    Baseline 6/19: unable to perform; walked 1  min 30 seconds for 1/3 of test.    Time 12    Period Weeks    Status New    Target Date 12/26/21      PT LONG TERM GOAL #3   Title Patient (< 71 years old) will complete five times sit to stand test in < 15 seconds indicating an increased LE strength and improved balance.    Baseline 6/19: 33 seconds heavy BUE support    Time 12    Period Weeks    Status New    Target Date 12/26/21      PT LONG TERM GOAL #4   Title Patient will increase hip strength to 4/5 for functional strength and mobility to  increase independence.    Baseline 6/19: flex abd 3/5 add 2+/5    Time 12    Period Weeks    Status New    Target Date 12/26/21      PT LONG TERM GOAL #5   Title Patient will report a worst pain of 3/10 on VAS in R hip  to improve tolerance with ADLs and reduced symptoms with activities.    Baseline 6/19: 10/10    Time 12    Period Weeks    Status New    Target Date 12/26/21              Plan     Clinical Impression Statement Patient has increased unsteadiness on feet with fatigue from prolonged seated position after physician appointment prior to PT session. Patient highly motivated despite her fatigue. Able to tolerate supine interventions. Patient will continue to benefit from skilled PT to address gait deficits, standing and mobility tolerance, and strength to improve quality of life and level of independence with functional mobility and ADLs.   Personal Factors and Comorbidities Comorbidity 3+;Transportation;Time since onset of injury/illness/exacerbation;Finances;Past/Current Experience    Comorbidities bilateral Le edema, BLE weakness, constipation, chronic pain, GERD, tremor, HSP, migraine, neurogenic bladder, chronic UTI, urostomy.    Examination-Activity Limitations Caring for Others;Carry;Continence;Squat;Stairs;Toileting;Locomotion Level;Lift;Stand;Bed Mobility;Bend;Reach Overhead;Sit;Dressing;Transfers;Bathing;Hygiene/Grooming;Sleep    Examination-Participation  Restrictions Church;Cleaning;Community Activity;Driving;Interpersonal Relationship;Laundry;Yard Work;Meal Prep;School;Volunteer;Shop    Stability/Clinical Decision Making Unstable/Unpredictable    Rehab Potential Fair    PT Frequency 2x / week    PT Duration 12 weeks    PT Treatment/Interventions ADLs/Self Care Home Management;Aquatic Therapy;Biofeedback;Moist Heat;Electrical Stimulation;Cryotherapy;Traction;Ultrasound;Therapeutic activities;Functional mobility training;Stair training;Gait training;Therapeutic exercise;Balance training;Neuromuscular re-education;Patient/family education;Manual techniques;Dry needling;Scar mobilization;Energy conservation;Taping;Joint Manipulations;Spinal Manipulations;Visual/perceptual remediation/compensation;Passive range of motion;Wheelchair mobility training;DME Instruction;Orthotic Fit/Training;Iontophoresis 4mg /ml Dexamethasone;Vestibular;Canalith Repostioning    PT Next Visit Plan progression per protocol    PT Home Exercise Plan give next session    Consulted and Agree with Plan of Care Patient             , PT, DPT  10/26/2021, 3:17 PM

## 2021-10-26 ENCOUNTER — Ambulatory Visit: Payer: BC Managed Care – PPO

## 2021-10-26 DIAGNOSIS — M25551 Pain in right hip: Secondary | ICD-10-CM

## 2021-10-26 DIAGNOSIS — R262 Difficulty in walking, not elsewhere classified: Secondary | ICD-10-CM

## 2021-10-26 DIAGNOSIS — M6281 Muscle weakness (generalized): Secondary | ICD-10-CM

## 2021-10-27 ENCOUNTER — Ambulatory Visit: Payer: BC Managed Care – PPO | Admitting: Physical Therapy

## 2021-10-27 NOTE — Therapy (Signed)
OUTPATIENT PHYSICAL THERAPY TREATMENT NOTE   Patient Name: Christine Chavez MRN: [id number] DOB:17-Jul-1980, 41 y.o., female Today's Date: 10/31/2021  PCP: Ermalinda Memos MD  REFERRING PROVIDER: Lacy Duverney MD    PT End of Session - 10/31/21 1157     Visit Number 8    Number of Visits 24    Date for PT Re-Evaluation 12/26/21    Authorization Type 8/10 eval 10/03/21    PT Start Time 1100    PT Stop Time 1144    PT Time Calculation (min) 44 min    Equipment Utilized During Treatment Gait belt    Activity Tolerance Patient tolerated treatment well;Patient limited by fatigue;Patient limited by pain    Behavior During Therapy WFL for tasks assessed/performed                   Past Medical History:  Diagnosis Date   Complication of anesthesia    ? seizures after anesthesia    Headache    Migraines    Neurogenic bladder    Renal disorder    Vision abnormalities    Past Surgical History:  Procedure Laterality Date   ANTERIOR CRUCIATE LIGAMENT REPAIR  1997   APPENDECTOMY     COLONOSCOPY WITH PROPOFOL N/A 11/11/2018   Procedure: COLONOSCOPY WITH PROPOFOL;  Surgeon: Christena Deem, MD;  Location: [facility] ENDOSCOPY;  Service: Endoscopy;  Laterality: N/A;   CYSTOSCOPY WITH STENT PLACEMENT Right 04/17/2016   Procedure: CYSTOSCOPY WITH STENT PLACEMENT;  Surgeon: Malen Gauze, MD;  Location: [facility] ORS;  Service: Urology;  Laterality: Right;   ESOPHAGOGASTRODUODENOSCOPY (EGD) WITH PROPOFOL N/A 11/11/2018   Procedure: ESOPHAGOGASTRODUODENOSCOPY (EGD) WITH PROPOFOL;  Surgeon: Christena Deem, MD;  Location: [facility] ENDOSCOPY;  Service: Endoscopy;  Laterality: N/A;   EXPLORATORY LAPAROTOMY  1999   KIDNEY STONE SURGERY  04/2016   REVISION UROSTOMY CUTANEOUS     REVISION UROSTOMY CUTANEOUS  01/10/2018   SUPRAPUBIC CATHETER PLACEMENT  08/2017   TONSILLECTOMY     Patient Active Problem List   Diagnosis Date Noted   Seizure (HCC) 11/11/2018   Major depressive  disorder, recurrent episode, moderate (HCC) 02/07/2018   Nephrolithiasis 04/16/2016   Numbness 07/28/2015   Bladder retention 06/23/2015   Abdominal pain 06/04/2015   Dizziness 05/18/2015   Neck pain 05/18/2015   Complicated migraine 04/28/2015   Other fatigue 04/28/2015   Abnormal finding on MRI of brain 04/28/2015   D (diarrhea) 03/29/2015   H/O disease 03/29/2015   Abnormal weight loss 03/29/2015   Muscle weakness (generalized) 03/14/2015   Headache, migraine 03/10/2015    REFERRING DIAG: L hip arthroscopy/ HSP   THERAPY DIAG:  Pain in right hip  Difficulty in walking, not elsewhere classified  Muscle weakness (generalized)  Other muscle spasm  Rationale for Evaluation and Treatment Rehabilitation  PERTINENT HISTORY: Patient is returning to PT s/p surgery R Hip arthroscopy w femoroplasty and labral repair, femoral osteochondroplasty complete capsular closure arthroscopy on 08/19/21. Patient has been seen prior to surgery by this author for her Hereditary Spastic Paraplegia (HSP). PMh includes bilateral Le edema, BLE weakness, constipation, chronic pain, GERD, tremor, HSP, migraine, neurogenic bladder,seizures, depression, migraine, COVID 19, chronic UTI, urostomy.  Saw physician June 8th. Has been starting gentle walking   PRECAUTIONS: fall, R hip   SUBJECTIVE: Patient reports having a fatiguing weekend. Had a horrible migraine that kept her up majority of the night during the weekend.   PAIN:  Are you having pain? Yes: NPRS scale: 6/10 Pain location:  R hip and low back Pain description: aching Aggravating factors: movement Relieving factors: rest     TODAY'S TREATMENT:      Supine: Bridge 10x ; 2 sets ; second set with GTB abduction GTB hip flexion 20x each LE Bilateral knee abduction GTB 20x SLR 10x AAROM each LE LE rotation for low back pain reduction 30 seconds x 3 trials Posterior pelvic tilt 15x 3 second holds  Seated: -GTB abduction 15x -GTB  alternating LAQ 10x each LE -GTB hamstring curl 15x each LE -lateral step out/in 10x  Standing holding onto PT shoulders for UE support -lateral stepping 6x length of // bars -forward/backwards walking 4x length of // bars -marching 12x each LE  -STS 10x       PATIENT EDUCATION: Education details: exercise technique, protocol Person educated: Patient Education method: Explanation, Demonstration, Tactile cues, and Verbal cues Education comprehension: verbalized understanding, returned demonstration, verbal cues required, and tactile cues required   HOME EXERCISE PROGRAM: Access Code: [email or web address] URL: [email or web address] Date: 10/05/2021 Prepared by: Precious Bard  Exercises - Supine Posterior Pelvic Tilt  - 1 x daily - 7 x weekly - 2 sets - 10 reps - 5 hold - Supine Transversus Abdominis Bracing - Hands on Thighs  - 1 x daily - 7 x weekly - 2 sets - 10 reps - 5 hold - Supine Transversus Abdominis Bracing with Double Leg Fallout  - 1 x daily - 7 x weekly - 2 sets - 10 reps - 5 hold   PT Short Term Goals       PT SHORT TERM GOAL #1   Title Patient will be independent in home exercise program to improve strength/mobility for better functional independence with ADLs.    Baseline 6/19 give next session    Time 6    Period Weeks    Status New    Target Date 11/14/21      PT SHORT TERM GOAL #2   Title Patient will report a reduction in pain to no greater than 6/10 over the prior week to demonstrate symptom improvement.    Baseline 6/19: 10/10 at worst    Time 6    Period Weeks    Status New    Target Date 11/14/21              PT Long Term Goals       PT LONG TERM GOAL #1   Title Patient will increase FOTO score to equal to or greater than  49   to demonstrate statistically significant improvement in mobility and quality of life    Baseline 6/19: 34    Time 12    Period Weeks    Status New    Target Date 12/26/21      PT LONG TERM GOAL #2    Title Patient will increase 10 meter walk test to <30 seconds as to improve gait speed for better community ambulation and to reduce fall risk.    Baseline 6/19: unable to perform; walked 1 min 30 seconds for 1/3 of test.    Time 12    Period Weeks    Status New    Target Date 12/26/21      PT LONG TERM GOAL #3   Title Patient (< 9 years old) will complete five times sit to stand test in < 15 seconds indicating an increased LE strength and improved balance.    Baseline 6/19: 33 seconds heavy BUE support    Time 12  Period Weeks    Status New    Target Date 12/26/21      PT LONG TERM GOAL #4   Title Patient will increase hip strength to 4/5 for functional strength and mobility to increase independence.    Baseline 6/19: flex abd 3/5 add 2+/5    Time 12    Period Weeks    Status New    Target Date 12/26/21      PT LONG TERM GOAL #5   Title Patient will report a worst pain of 3/10 on VAS in R hip  to improve tolerance with ADLs and reduced symptoms with activities.    Baseline 6/19: 10/10    Time 12    Period Weeks    Status New    Target Date 12/26/21              Plan     Clinical Impression Statement Patient is highly motivated despite fatigue. She is able to perform seated, standing, and supine interventions without pain increase. Noticeable buckling of LLE noted in standing interventions. Patient will continue to benefit from skilled PT to address gait deficits, standing and mobility tolerance, and strength to improve quality of life and level of independence with functional mobility and ADLs.   Personal Factors and Comorbidities Comorbidity 3+;Transportation;Time since onset of injury/illness/exacerbation;Finances;Past/Current Experience    Comorbidities bilateral Le edema, BLE weakness, constipation, chronic pain, GERD, tremor, HSP, migraine, neurogenic bladder, chronic UTI, urostomy.    Examination-Activity Limitations Caring for  Others;Carry;Continence;Squat;Stairs;Toileting;Locomotion Level;Lift;Stand;Bed Mobility;Bend;Reach Overhead;Sit;Dressing;Transfers;Bathing;Hygiene/Grooming;Sleep    Examination-Participation Restrictions Church;Cleaning;Community Activity;Driving;Interpersonal Relationship;Laundry;Yard Work;Meal Prep;School;Volunteer;Shop    Stability/Clinical Decision Making Unstable/Unpredictable    Rehab Potential Fair    PT Frequency 2x / week    PT Duration 12 weeks    PT Treatment/Interventions ADLs/Self Care Home Management;Aquatic Therapy;Biofeedback;Moist Heat;Electrical Stimulation;Cryotherapy;Traction;Ultrasound;Therapeutic activities;Functional mobility training;Stair training;Gait training;Therapeutic exercise;Balance training;Neuromuscular re-education;Patient/family education;Manual techniques;Dry needling;Scar mobilization;Energy conservation;Taping;Joint Manipulations;Spinal Manipulations;Visual/perceptual remediation/compensation;Passive range of motion;Wheelchair mobility training;DME Instruction;Orthotic Fit/Training;Iontophoresis 4mg /ml Dexamethasone;Vestibular;Canalith Repostioning    PT Next Visit Plan progression per protocol    PT Home Exercise Plan give next session    Consulted and Agree with Plan of Care Patient             , PT, DPT  10/31/2021, 12:04 PM

## 2021-10-31 ENCOUNTER — Ambulatory Visit: Payer: BC Managed Care – PPO

## 2021-10-31 DIAGNOSIS — M25551 Pain in right hip: Secondary | ICD-10-CM | POA: Diagnosis not present

## 2021-10-31 DIAGNOSIS — M62838 Other muscle spasm: Secondary | ICD-10-CM

## 2021-10-31 DIAGNOSIS — R262 Difficulty in walking, not elsewhere classified: Secondary | ICD-10-CM

## 2021-10-31 DIAGNOSIS — M6281 Muscle weakness (generalized): Secondary | ICD-10-CM

## 2021-11-02 NOTE — Therapy (Signed)
OUTPATIENT PHYSICAL THERAPY TREATMENT NOTE   Patient Name: Christine Chavez MRN: [id number] DOB:1980/04/20, 41 y.o., female Today's Date: 11/03/2021  PCP: Ermalinda Memos MD  REFERRING PROVIDER: Lacy Duverney MD    PT End of Session - 11/03/21 0759     Visit Number 9    Number of Visits 24    Date for PT Re-Evaluation 12/26/21    Authorization Type 9/10 eval 10/03/21    PT Start Time 0800    PT Stop Time 0844    PT Time Calculation (min) 44 min    Equipment Utilized During Treatment Gait belt    Activity Tolerance Patient tolerated treatment well;Patient limited by fatigue;Patient limited by pain    Behavior During Therapy WFL for tasks assessed/performed                    Past Medical History:  Diagnosis Date   Complication of anesthesia    ? seizures after anesthesia    Headache    Migraines    Neurogenic bladder    Renal disorder    Vision abnormalities    Past Surgical History:  Procedure Laterality Date   ANTERIOR CRUCIATE LIGAMENT REPAIR  1997   APPENDECTOMY     COLONOSCOPY WITH PROPOFOL N/A 11/11/2018   Procedure: COLONOSCOPY WITH PROPOFOL;  Surgeon: Christena Deem, MD;  Location: [facility] ENDOSCOPY;  Service: Endoscopy;  Laterality: N/A;   CYSTOSCOPY WITH STENT PLACEMENT Right 04/17/2016   Procedure: CYSTOSCOPY WITH STENT PLACEMENT;  Surgeon: Malen Gauze, MD;  Location: [facility] ORS;  Service: Urology;  Laterality: Right;   ESOPHAGOGASTRODUODENOSCOPY (EGD) WITH PROPOFOL N/A 11/11/2018   Procedure: ESOPHAGOGASTRODUODENOSCOPY (EGD) WITH PROPOFOL;  Surgeon: Christena Deem, MD;  Location: [facility] ENDOSCOPY;  Service: Endoscopy;  Laterality: N/A;   EXPLORATORY LAPAROTOMY  1999   KIDNEY STONE SURGERY  04/2016   REVISION UROSTOMY CUTANEOUS     REVISION UROSTOMY CUTANEOUS  01/10/2018   SUPRAPUBIC CATHETER PLACEMENT  08/2017   TONSILLECTOMY     Patient Active Problem List   Diagnosis Date Noted   Seizure (HCC) 11/11/2018   Major depressive  disorder, recurrent episode, moderate (HCC) 02/07/2018   Nephrolithiasis 04/16/2016   Numbness 07/28/2015   Bladder retention 06/23/2015   Abdominal pain 06/04/2015   Dizziness 05/18/2015   Neck pain 05/18/2015   Complicated migraine 04/28/2015   Other fatigue 04/28/2015   Abnormal finding on MRI of brain 04/28/2015   D (diarrhea) 03/29/2015   H/O disease 03/29/2015   Abnormal weight loss 03/29/2015   Muscle weakness (generalized) 03/14/2015   Headache, migraine 03/10/2015    REFERRING DIAG: L hip arthroscopy/ HSP   THERAPY DIAG:  Pain in right hip  Difficulty in walking, not elsewhere classified  Muscle weakness (generalized)  Rationale for Evaluation and Treatment Rehabilitation  PERTINENT HISTORY: Patient is returning to PT s/p surgery R Hip arthroscopy w femoroplasty and labral repair, femoral osteochondroplasty complete capsular closure arthroscopy on 08/19/21. Patient has been seen prior to surgery by this author for her Hereditary Spastic Paraplegia (HSP). PMh includes bilateral Le edema, BLE weakness, constipation, chronic pain, GERD, tremor, HSP, migraine, neurogenic bladder,seizures, depression, migraine, COVID 19, chronic UTI, urostomy.  Saw physician June 8th. Has been starting gentle walking   PRECAUTIONS: fall, R hip   SUBJECTIVE: Patient presents to physical therapy with excellent motivation. Continues to have some fatigue and buckling of LE's.   PAIN:  Are you having pain? Yes: NPRS scale: 6/10 Pain location: R hip and low back Pain description:  aching Aggravating factors: movement Relieving factors: rest     TODAY'S TREATMENT:      Supine: Bridge 10x ; 2 sets ; second set with GTB abduction GTB hip flexion 20x each LE Bilateral knee abduction GTB 20x LE rotation for low back pain reduction 30 seconds x 3 trials   Seated: -GTB alternating LAQ 10x each LE -GTB hamstring curl 15x each LE  Standing with support bar:  -lateral stepping 8x length  of // bars -hip extension 10x each LE      PATIENT EDUCATION: Education details: exercise technique, protocol Person educated: Patient Education method: Explanation, Demonstration, Tactile cues, and Verbal cues Education comprehension: verbalized understanding, returned demonstration, verbal cues required, and tactile cues required   HOME EXERCISE PROGRAM: Access Code: [email or web address] URL: [email or web address] Date: 10/05/2021 Prepared by: Precious Bard  Exercises - Supine Posterior Pelvic Tilt  - 1 x daily - 7 x weekly - 2 sets - 10 reps - 5 hold - Supine Transversus Abdominis Bracing - Hands on Thighs  - 1 x daily - 7 x weekly - 2 sets - 10 reps - 5 hold - Supine Transversus Abdominis Bracing with Double Leg Fallout  - 1 x daily - 7 x weekly - 2 sets - 10 reps - 5 hold   PT Short Term Goals       PT SHORT TERM GOAL #1   Title Patient will be independent in home exercise program to improve strength/mobility for better functional independence with ADLs.    Baseline 6/19 give next session    Time 6    Period Weeks    Status New    Target Date 11/14/21      PT SHORT TERM GOAL #2   Title Patient will report a reduction in pain to no greater than 6/10 over the prior week to demonstrate symptom improvement.    Baseline 6/19: 10/10 at worst    Time 6    Period Weeks    Status New    Target Date 11/14/21              PT Long Term Goals       PT LONG TERM GOAL #1   Title Patient will increase FOTO score to equal to or greater than  49   to demonstrate statistically significant improvement in mobility and quality of life    Baseline 6/19: 34    Time 12    Period Weeks    Status New    Target Date 12/26/21      PT LONG TERM GOAL #2   Title Patient will increase 10 meter walk test to <30 seconds as to improve gait speed for better community ambulation and to reduce fall risk.    Baseline 6/19: unable to perform; walked 1 min 30 seconds for 1/3 of test.     Time 12    Period Weeks    Status New    Target Date 12/26/21      PT LONG TERM GOAL #3   Title Patient (< 1 years old) will complete five times sit to stand test in < 15 seconds indicating an increased LE strength and improved balance.    Baseline 6/19: 33 seconds heavy BUE support    Time 12    Period Weeks    Status New    Target Date 12/26/21      PT LONG TERM GOAL #4   Title Patient will increase hip strength to 4/5 for functional strength and  mobility to increase independence.    Baseline 6/19: flex abd 3/5 add 2+/5    Time 12    Period Weeks    Status New    Target Date 12/26/21      PT LONG TERM GOAL #5   Title Patient will report a worst pain of 3/10 on VAS in R hip  to improve tolerance with ADLs and reduced symptoms with activities.    Baseline 6/19: 10/10    Time 12    Period Weeks    Status New    Target Date 12/26/21              Plan     Clinical Impression Statement Patient presents with excellent motivation. Increased lateral stepping performed this session with no knee buckling, only trembling. Patient is challenged with hip extension due to LLE weakness. Supine interventions tolerated without pain increase. Patient will continue to benefit from skilled PT to address gait deficits, standing and mobility tolerance, and strength to improve quality of life and level of independence with functional mobility and ADLs.   Personal Factors and Comorbidities Comorbidity 3+;Transportation;Time since onset of injury/illness/exacerbation;Finances;Past/Current Experience    Comorbidities bilateral Le edema, BLE weakness, constipation, chronic pain, GERD, tremor, HSP, migraine, neurogenic bladder, chronic UTI, urostomy.    Examination-Activity Limitations Caring for Others;Carry;Continence;Squat;Stairs;Toileting;Locomotion Level;Lift;Stand;Bed Mobility;Bend;Reach Overhead;Sit;Dressing;Transfers;Bathing;Hygiene/Grooming;Sleep    Examination-Participation Restrictions  Church;Cleaning;Community Activity;Driving;Interpersonal Relationship;Laundry;Yard Work;Meal Prep;School;Volunteer;Shop    Stability/Clinical Decision Making Unstable/Unpredictable    Rehab Potential Fair    PT Frequency 2x / week    PT Duration 12 weeks    PT Treatment/Interventions ADLs/Self Care Home Management;Aquatic Therapy;Biofeedback;Moist Heat;Electrical Stimulation;Cryotherapy;Traction;Ultrasound;Therapeutic activities;Functional mobility training;Stair training;Gait training;Therapeutic exercise;Balance training;Neuromuscular re-education;Patient/family education;Manual techniques;Dry needling;Scar mobilization;Energy conservation;Taping;Joint Manipulations;Spinal Manipulations;Visual/perceptual remediation/compensation;Passive range of motion;Wheelchair mobility training;DME Instruction;Orthotic Fit/Training;Iontophoresis 4mg /ml Dexamethasone;Vestibular;Canalith Repostioning    PT Next Visit Plan progression per protocol    PT Home Exercise Plan give next session    Consulted and Agree with Plan of Care Patient             , PT, DPT  11/03/2021, 12:20 PM

## 2021-11-03 ENCOUNTER — Ambulatory Visit: Payer: BC Managed Care – PPO

## 2021-11-03 ENCOUNTER — Ambulatory Visit: Payer: BC Managed Care – PPO | Admitting: Physical Therapy

## 2021-11-03 DIAGNOSIS — M25551 Pain in right hip: Secondary | ICD-10-CM | POA: Diagnosis not present

## 2021-11-03 DIAGNOSIS — R262 Difficulty in walking, not elsewhere classified: Secondary | ICD-10-CM

## 2021-11-03 DIAGNOSIS — M6281 Muscle weakness (generalized): Secondary | ICD-10-CM

## 2021-11-03 NOTE — Therapy (Signed)
OUTPATIENT PHYSICAL THERAPY TREATMENT NOTE/ Physical Therapy Progress Note   Dates of reporting period  10/03/21   to   11/07/21    Patient Name: Christine Chavez MRN: [id number] DOB:1981-01-09, 41 y.o., female Today's Date: 11/07/2021  PCP: Elza Rafter MD  REFERRING PROVIDER: Genia Del MD    PT End of Session - 11/07/21 1054     Visit Number 10    Number of Visits 24    Date for PT Re-Evaluation 12/26/21    Authorization Type 10/10 eval 10/03/21; next session 1/10 PN 7/24    PT Start Time 1100    PT Stop Time 1144    PT Time Calculation (min) 44 min    Equipment Utilized During Treatment Gait belt    Activity Tolerance Patient tolerated treatment well;Patient limited by fatigue;Patient limited by pain    Behavior During Therapy WFL for tasks assessed/performed                     Past Medical History:  Diagnosis Date   Complication of anesthesia    ? seizures after anesthesia    Headache    Migraines    Neurogenic bladder    Renal disorder    Vision abnormalities    Past Surgical History:  Procedure Laterality Date   ANTERIOR CRUCIATE LIGAMENT REPAIR  1997   APPENDECTOMY     COLONOSCOPY WITH PROPOFOL N/A 11/11/2018   Procedure: COLONOSCOPY WITH PROPOFOL;  Surgeon: [name], MD;  Location: [facility] ENDOSCOPY;  Service: Endoscopy;  Laterality: N/A;   CYSTOSCOPY WITH STENT PLACEMENT Right 04/17/2016   Procedure: CYSTOSCOPY WITH STENT PLACEMENT;  Surgeon: [name], MD;  Location: [facility] ORS;  Service: Urology;  Laterality: Right;   ESOPHAGOGASTRODUODENOSCOPY (EGD) WITH PROPOFOL N/A 11/11/2018   Procedure: ESOPHAGOGASTRODUODENOSCOPY (EGD) WITH PROPOFOL;  Surgeon: [name], MD;  Location: [facility] ENDOSCOPY;  Service: Endoscopy;  Laterality: N/A;   EXPLORATORY LAPAROTOMY  1999   KIDNEY STONE SURGERY  04/2016   REVISION UROSTOMY CUTANEOUS     REVISION UROSTOMY CUTANEOUS  01/10/2018   SUPRAPUBIC CATHETER PLACEMENT  08/2017    TONSILLECTOMY     Patient Active Problem List   Diagnosis Date Noted   Seizure ([place]) 11/11/2018   Major depressive disorder, recurrent episode, moderate ([place]) 02/07/2018   Nephrolithiasis 04/16/2016   Numbness 07/28/2015   Bladder retention 06/23/2015   Abdominal pain 06/04/2015   Dizziness 05/18/2015   Neck pain 48/04/6551   Complicated migraine 74/82/7078   Other fatigue 04/28/2015   Abnormal finding on MRI of brain 04/28/2015   D (diarrhea) 03/29/2015   H/O disease 03/29/2015   Abnormal weight loss 03/29/2015   Muscle weakness (generalized) 03/14/2015   Headache, migraine 03/10/2015    REFERRING DIAG: L hip arthroscopy/ HSP   THERAPY DIAG:  Pain in right hip  Difficulty in walking, not elsewhere classified  Muscle weakness (generalized)  Rationale for Evaluation and Treatment Rehabilitation  PERTINENT HISTORY: Patient is returning to PT s/p surgery R Hip arthroscopy w femoroplasty and labral repair, femoral osteochondroplasty complete capsular closure arthroscopy on 08/19/21. Patient has been seen prior to surgery by this author for her Hereditary Spastic Paraplegia (HSP). PMh includes bilateral Le edema, BLE weakness, constipation, chronic pain, GERD, tremor, HSP, migraine, neurogenic bladder,seizures, depression, migraine, COVID 19, chronic UTI, urostomy.  Saw physician June 8th. Has been starting gentle walking   PRECAUTIONS: fall, R hip   SUBJECTIVE: Patient spoke at church since last session. Has not been sleeping well.  PAIN:  Are you having pain? Yes: NPRS scale: 6/10 Pain location: R hip and low back Pain description: aching Aggravating factors: movement Relieving factors: rest     TODAY'S TREATMENT:   Progress Note:  Goals:  HEP : compliant  VAS: 4-5/10  FOTO: 34%  10 MWT: 2 minutes 16 seconds  5x STS: 21 seconds BUE support    Supine: Bridge 10x ; 2 sets ; second set with GTB abduction GTB hip flexion 20x each LE Bilateral knee abduction GTB  20x LE rotation for low back pain reduction 30 seconds x 3 trials Posterior pelvic tilt 10x 3 second holds Posterior pelvic tilt with single leg abduction, exhale to return to neutral position.  Seated: -GTB alternating LAQ 10x each LE -GTB hamstring curl 15x each LE      PATIENT EDUCATION: Education details: exercise technique, protocol Person educated: Patient Education method: Explanation, Demonstration, Tactile cues, and Verbal cues Education comprehension: verbalized understanding, returned demonstration, verbal cues required, and tactile cues required   HOME EXERCISE PROGRAM: Access Code: [email or web address] URL: [email or web address] Date: 10/05/2021 Prepared by: Janna Arch  Exercises - Supine Posterior Pelvic Tilt  - 1 x daily - 7 x weekly - 2 sets - 10 reps - 5 hold - Supine Transversus Abdominis Bracing - Hands on Thighs  - 1 x daily - 7 x weekly - 2 sets - 10 reps - 5 hold - Supine Transversus Abdominis Bracing with Double Leg Fallout  - 1 x daily - 7 x weekly - 2 sets - 10 reps - 5 hold   PT Short Term Goals       PT SHORT TERM GOAL #1   Title Patient will be independent in home exercise program to improve strength/mobility for better functional independence with ADLs.    Baseline 6/19 give next session 7/24: HEP compliant   Time 6    Period Weeks    Status MET   Target Date 11/14/21      PT SHORT TERM GOAL #2   Title Patient will report a reduction in pain to no greater than 6/10 over the prior week to demonstrate symptom improvement.    Baseline 6/19: 10/10 at worst 7/24: 4-5/10    Time 6    Period Weeks    Status MET   Target Date 11/14/21              PT Long Term Goals       PT LONG TERM GOAL #1   Title Patient will increase FOTO score to equal to or greater than  49   to demonstrate statistically significant improvement in mobility and quality of life    Baseline 6/19: 34 7/24: 34%    Time 12    Period Weeks    Status Ongoing    Target Date 12/26/21      PT LONG TERM GOAL #2   Title Patient will increase 10 meter walk test to <30 seconds as to improve gait speed for better community ambulation and to reduce fall risk.    Baseline 6/19: unable to perform; walked 1 min 30 seconds for 1/3 of test. 7/24: 2 minutes 16 seconds with rollator   Time 12    Period Weeks    Status Partially Met   Target Date 12/26/21      PT LONG TERM GOAL #3   Title Patient (< 56 years old) will complete five times sit to stand test in < 15 seconds indicating an increased LE strength and  improved balance.    Baseline 6/19: 33 seconds heavy BUE support 7/24: 21 seconds heavy BUE support   Time 12    Period Weeks    Status Partially Met   Target Date 12/26/21      PT LONG TERM GOAL #4   Title Patient will increase hip strength to 4/5 for functional strength and mobility to increase independence.    Baseline 6/19: flex abd 3/5 add 2+/5    Time 12    Period Weeks    Status New    Target Date 12/26/21      PT LONG TERM GOAL #5   Title Patient will report a worst pain of 3/10 on VAS in R hip  to improve tolerance with ADLs and reduced symptoms with activities.    Baseline 6/19: 10/10 7/24: 4-5/10    Time 12    Period Weeks    Status New    Target Date 12/26/21              Plan     Clinical Impression Statement Patient has made significant progress towards functional goals. She is able to complete the 10 MWT for the first time and improved her sit to stand transfer speed by over 10 seconds. Her pain is still present but decreased.  Patient's condition has the potential to improve in response to therapy. Maximum improvement is yet to be obtained. The anticipated improvement is attainable and reasonable in a generally predictable time.Patient will continue to benefit from skilled PT to address gait deficits, standing and mobility tolerance, and strength to improve quality of life and level of independence with functional mobility and  ADLs.   Personal Factors and Comorbidities Comorbidity 3+;Transportation;Time since onset of injury/illness/exacerbation;Finances;Past/Current Experience    Comorbidities bilateral Le edema, BLE weakness, constipation, chronic pain, GERD, tremor, HSP, migraine, neurogenic bladder, chronic UTI, urostomy.    Examination-Activity Limitations Caring for Others;Carry;Continence;Squat;Stairs;Toileting;Locomotion Level;Lift;Stand;Bed Mobility;Bend;Reach Overhead;Sit;Dressing;Transfers;Bathing;Hygiene/Grooming;Sleep    Examination-Participation Restrictions Church;Cleaning;Community Activity;Driving;Interpersonal Relationship;Laundry;Yard Work;Meal Prep;School;Volunteer;Shop    Stability/Clinical Decision Making Unstable/Unpredictable    Rehab Potential Fair    PT Frequency 2x / week    PT Duration 12 weeks    PT Treatment/Interventions ADLs/Self Care Home Management;Aquatic Therapy;Biofeedback;Moist Heat;Electrical Stimulation;Cryotherapy;Traction;Ultrasound;Therapeutic activities;Functional mobility training;Stair training;Gait training;Therapeutic exercise;Balance training;Neuromuscular re-education;Patient/family education;Manual techniques;Dry needling;Scar mobilization;Energy conservation;Taping;Joint Manipulations;Spinal Manipulations;Visual/perceptual remediation/compensation;Passive range of motion;Wheelchair mobility training;DME Instruction;Orthotic Fit/Training;Iontophoresis 78m/ml Dexamethasone;Vestibular;Canalith Repostioning    PT Next Visit Plan progression per protocol    PT Home Exercise Plan give next session    Consulted and Agree with Plan of Care Patient             MJanna Arch PT, DPT  11/07/2021, 12:18 PM

## 2021-11-07 ENCOUNTER — Ambulatory Visit: Payer: BC Managed Care – PPO

## 2021-11-07 DIAGNOSIS — M25551 Pain in right hip: Secondary | ICD-10-CM

## 2021-11-07 DIAGNOSIS — R262 Difficulty in walking, not elsewhere classified: Secondary | ICD-10-CM

## 2021-11-07 DIAGNOSIS — M6281 Muscle weakness (generalized): Secondary | ICD-10-CM

## 2021-11-10 ENCOUNTER — Ambulatory Visit: Payer: BC Managed Care – PPO

## 2021-11-10 ENCOUNTER — Ambulatory Visit: Payer: BC Managed Care – PPO | Admitting: Physical Therapy

## 2021-11-13 NOTE — Therapy (Signed)
OUTPATIENT PHYSICAL THERAPY TREATMENT NOTE   Patient Name: Christine Chavez MRN: [id number] DOB:[birth year], 41 y.o., female Today's Date: 11/14/2021  PCP: Ermalinda Memos MD  REFERRING PROVIDER: Lacy Duverney MD    PT End of Session - 11/14/21 1047     Visit Number 11    Number of Visits 24    Date for PT Re-Evaluation 12/26/21    Authorization Type 1/10 PN 7/24    PT Start Time 1055    PT Stop Time 1145    PT Time Calculation (min) 50 min    Equipment Utilized During Treatment Gait belt    Activity Tolerance Patient tolerated treatment well;Patient limited by fatigue;Patient limited by pain    Behavior During Therapy WFL for tasks assessed/performed                     Past Medical History:  Diagnosis Date   Complication of anesthesia    ? seizures after anesthesia    Headache    Migraines    Neurogenic bladder    Renal disorder    Vision abnormalities    Past Surgical History:  Procedure Laterality Date   ANTERIOR CRUCIATE LIGAMENT REPAIR  1997   APPENDECTOMY     COLONOSCOPY WITH PROPOFOL N/A 11/11/2018   Procedure: COLONOSCOPY WITH PROPOFOL;  Surgeon: Christena Deem, MD;  Location: [facility] ENDOSCOPY;  Service: Endoscopy;  Laterality: N/A;   CYSTOSCOPY WITH STENT PLACEMENT Right 04/17/2016   Procedure: CYSTOSCOPY WITH STENT PLACEMENT;  Surgeon: Malen Gauze, MD;  Location: [facility] ORS;  Service: Urology;  Laterality: Right;   ESOPHAGOGASTRODUODENOSCOPY (EGD) WITH PROPOFOL N/A 11/11/2018   Procedure: ESOPHAGOGASTRODUODENOSCOPY (EGD) WITH PROPOFOL;  Surgeon: Christena Deem, MD;  Location: [facility] ENDOSCOPY;  Service: Endoscopy;  Laterality: N/A;   EXPLORATORY LAPAROTOMY  1999   KIDNEY STONE SURGERY  04/2016   REVISION UROSTOMY CUTANEOUS     REVISION UROSTOMY CUTANEOUS  01/10/2018   SUPRAPUBIC CATHETER PLACEMENT  08/2017   TONSILLECTOMY     Patient Active Problem List   Diagnosis Date Noted   Seizure (HCC) 11/11/2018   Major depressive  disorder, recurrent episode, moderate (HCC) 02/07/2018   Nephrolithiasis 04/16/2016   Numbness 07/28/2015   Bladder retention 06/23/2015   Abdominal pain 06/04/2015   Dizziness 05/18/2015   Neck pain 05/18/2015   Complicated migraine 04/28/2015   Other fatigue 04/28/2015   Abnormal finding on MRI of brain 04/28/2015   D (diarrhea) 03/29/2015   H/O disease 03/29/2015   Abnormal weight loss 03/29/2015   Muscle weakness (generalized) 03/14/2015   Headache, migraine 03/10/2015    REFERRING DIAG: L hip arthroscopy/ HSP   THERAPY DIAG:  Pain in right hip  Difficulty in walking, not elsewhere classified  Muscle weakness (generalized)  Other muscle spasm  Rationale for Evaluation and Treatment Rehabilitation  PERTINENT HISTORY: Patient is returning to PT s/p surgery R Hip arthroscopy w femoroplasty and labral repair, femoral osteochondroplasty complete capsular closure arthroscopy on 08/19/21. Patient has been seen prior to surgery by this author for her Hereditary Spastic Paraplegia (HSP). PMh includes bilateral Le edema, BLE weakness, constipation, chronic pain, GERD, tremor, HSP, migraine, neurogenic bladder,seizures, depression, migraine, COVID 19, chronic UTI, urostomy.  Saw physician June 8th. Has been starting gentle walking   PRECAUTIONS: fall, R hip   SUBJECTIVE: Patient has been having severe UTI pains and weakness. Has been compliant with HEP.   PAIN:  Are you having pain? 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 TrA activation with UE raises 10x each  LE Green swiss ball single leg hamstring curl 15x each LE  Seated: -GTB alternating LAQ 10x each LE -GTB hamstring curl 15x each LE -GTB alternating abduction/ER 10x each LE  Car transfer with mod A for LE's   PATIENT EDUCATION: Education details: exercise technique, protocol Person educated: Patient Education method: Explanation, Demonstration, Tactile cues, and Verbal cues Education comprehension: verbalized understanding, returned demonstration, verbal cues required, and tactile cues required   HOME EXERCISE PROGRAM: Access Code: [email or web address] URL: [email or web address] Date: 10/05/2021 Prepared by: Precious Bard  Exercises - Supine Posterior Pelvic Tilt  - 1 x daily - 7 x weekly - 2 sets - 10 reps - 5 hold - Supine Transversus Abdominis Bracing - Hands on Thighs  - 1 x daily - 7 x weekly - 2 sets - 10 reps - 5 hold - Supine Transversus Abdominis Bracing with Double Leg Fallout  - 1 x daily - 7 x weekly - 2 sets - 10 reps - 5 hold   PT Short Term Goals       PT SHORT TERM GOAL #1   Title Patient will be independent in home exercise program to improve strength/mobility for better functional independence with ADLs.    Baseline 6/19 give next session    Time 6    Period Weeks    Status New    Target Date 11/14/21      PT SHORT TERM GOAL #2   Title Patient will report a reduction in pain to no greater than 6/10 over the prior week to demonstrate symptom improvement.    Baseline 6/19: 10/10 at worst    Time 6    Period Weeks    Status New    Target Date 11/14/21              PT Long Term Goals       PT LONG TERM GOAL #1   Title Patient will increase FOTO score to equal to or greater than  49   to demonstrate statistically significant improvement in mobility and quality of life    Baseline 6/19: 34    Time 12    Period Weeks    Status New    Target Date 12/26/21      PT LONG TERM GOAL #2   Title Patient will increase 10 meter walk test to <30  seconds as to improve gait speed for better community ambulation and to reduce fall risk.    Baseline 6/19: unable to perform; walked 1 min 30 seconds for 1/3 of test.    Time 12    Period Weeks    Status New    Target Date 12/26/21      PT LONG TERM GOAL #3   Title Patient (< 38 years old) will complete five times sit to stand test in < 15 seconds indicating an increased LE strength and improved balance.    Baseline 6/19: 33 seconds heavy BUE support    Time 12    Period Weeks  Status New    Target Date 12/26/21      PT LONG TERM GOAL #4   Title Patient will increase hip strength to 4/5 for functional strength and mobility to increase independence.    Baseline 6/19: flex abd 3/5 add 2+/5    Time 12    Period Weeks    Status New    Target Date 12/26/21      PT LONG TERM GOAL #5   Title Patient will report a worst pain of 3/10 on VAS in R hip  to improve tolerance with ADLs and reduced symptoms with activities.    Baseline 6/19: 10/10    Time 12    Period Weeks    Status New    Target Date 12/26/21              Plan     Clinical Impression Statement Patient is highly motivated throughout session. She tolerated progression of core strengthening with no pain increase. Limited standing interventions due to new onset of UTI causing increased pain and weakness.. Patient will continue to benefit from skilled PT to address gait deficits, standing and mobility tolerance, and strength to improve quality of life and level of independence with functional mobility and ADLs.   Personal Factors and Comorbidities Comorbidity 3+;Transportation;Time since onset of injury/illness/exacerbation;Finances;Past/Current Experience    Comorbidities bilateral Le edema, BLE weakness, constipation, chronic pain, GERD, tremor, HSP, migraine, neurogenic bladder, chronic UTI, urostomy.    Examination-Activity Limitations Caring for Others;Carry;Continence;Squat;Stairs;Toileting;Locomotion  Level;Lift;Stand;Bed Mobility;Bend;Reach Overhead;Sit;Dressing;Transfers;Bathing;Hygiene/Grooming;Sleep    Examination-Participation Restrictions Church;Cleaning;Community Activity;Driving;Interpersonal Relationship;Laundry;Yard Work;Meal Prep;School;Volunteer;Shop    Stability/Clinical Decision Making Unstable/Unpredictable    Rehab Potential Fair    PT Frequency 2x / week    PT Duration 12 weeks    PT Treatment/Interventions ADLs/Self Care Home Management;Aquatic Therapy;Biofeedback;Moist Heat;Electrical Stimulation;Cryotherapy;Traction;Ultrasound;Therapeutic activities;Functional mobility training;Stair training;Gait training;Therapeutic exercise;Balance training;Neuromuscular re-education;Patient/family education;Manual techniques;Dry needling;Scar mobilization;Energy conservation;Taping;Joint Manipulations;Spinal Manipulations;Visual/perceptual remediation/compensation;Passive range of motion;Wheelchair mobility training;DME Instruction;Orthotic Fit/Training;Iontophoresis 4mg /ml Dexamethasone;Vestibular;Canalith Repostioning    PT Next Visit Plan progression per protocol    PT Home Exercise Plan give next session    Consulted and Agree with Plan of Care Patient             Janna Arch, PT, DPT  11/14/2021, 12:36 PM

## 2021-11-14 ENCOUNTER — Ambulatory Visit: Payer: BC Managed Care – PPO

## 2021-11-14 DIAGNOSIS — M62838 Other muscle spasm: Secondary | ICD-10-CM

## 2021-11-14 DIAGNOSIS — M25551 Pain in right hip: Secondary | ICD-10-CM | POA: Diagnosis not present

## 2021-11-14 DIAGNOSIS — M6281 Muscle weakness (generalized): Secondary | ICD-10-CM

## 2021-11-14 DIAGNOSIS — R262 Difficulty in walking, not elsewhere classified: Secondary | ICD-10-CM

## 2021-11-16 ENCOUNTER — Ambulatory Visit: Payer: BC Managed Care – PPO

## 2021-11-17 ENCOUNTER — Ambulatory Visit: Payer: BC Managed Care – PPO | Admitting: Physical Therapy

## 2021-11-17 NOTE — Therapy (Signed)
OUTPATIENT PHYSICAL THERAPY TREATMENT NOTE   Patient Name: Christine Chavez MRN: [id number] DOB:[birth year], 41 y.o., female Today's Date: 11/21/2021  PCP: Ermalinda Memos MD  REFERRING PROVIDER: Lacy Duverney MD    PT End of Session - 11/21/21 1156     Visit Number 12    Number of Visits 24    Date for PT Re-Evaluation 12/26/21    Authorization Type 2/10 PN 7/24    PT Start Time 1100    PT Stop Time 1145    PT Time Calculation (min) 45 min    Equipment Utilized During Treatment Gait belt    Activity Tolerance Patient tolerated treatment well;Patient limited by fatigue;Patient limited by pain    Behavior During Therapy WFL for tasks assessed/performed                      Past Medical History:  Diagnosis Date   Complication of anesthesia    ? seizures after anesthesia    Headache    Migraines    Neurogenic bladder    Renal disorder    Vision abnormalities    Past Surgical History:  Procedure Laterality Date   ANTERIOR CRUCIATE LIGAMENT REPAIR  1997   APPENDECTOMY     COLONOSCOPY WITH PROPOFOL N/A 11/11/2018   Procedure: COLONOSCOPY WITH PROPOFOL;  Surgeon: Christena Deem, MD;  Location: [facility] ENDOSCOPY;  Service: Endoscopy;  Laterality: N/A;   CYSTOSCOPY WITH STENT PLACEMENT Right 04/17/2016   Procedure: CYSTOSCOPY WITH STENT PLACEMENT;  Surgeon: Malen Gauze, MD;  Location: [facility] ORS;  Service: Urology;  Laterality: Right;   ESOPHAGOGASTRODUODENOSCOPY (EGD) WITH PROPOFOL N/A 11/11/2018   Procedure: ESOPHAGOGASTRODUODENOSCOPY (EGD) WITH PROPOFOL;  Surgeon: Christena Deem, MD;  Location: [facility] ENDOSCOPY;  Service: Endoscopy;  Laterality: N/A;   EXPLORATORY LAPAROTOMY  1999   KIDNEY STONE SURGERY  04/2016   REVISION UROSTOMY CUTANEOUS     REVISION UROSTOMY CUTANEOUS  01/10/2018   SUPRAPUBIC CATHETER PLACEMENT  08/2017   TONSILLECTOMY     Patient Active Problem List   Diagnosis Date Noted   Seizure (HCC) 11/11/2018   Major depressive  disorder, recurrent episode, moderate (HCC) 02/07/2018   Nephrolithiasis 04/16/2016   Numbness 07/28/2015   Bladder retention 06/23/2015   Abdominal pain 06/04/2015   Dizziness 05/18/2015   Neck pain 05/18/2015   Complicated migraine 04/28/2015   Other fatigue 04/28/2015   Abnormal finding on MRI of brain 04/28/2015   D (diarrhea) 03/29/2015   H/O disease 03/29/2015   Abnormal weight loss 03/29/2015   Muscle weakness (generalized) 03/14/2015   Headache, migraine 03/10/2015    REFERRING DIAG: L hip arthroscopy/ HSP   THERAPY DIAG:  Pain in right hip  Difficulty in walking, not elsewhere classified  Muscle weakness (generalized)  Rationale for Evaluation and Treatment Rehabilitation  PERTINENT HISTORY: Patient is returning to PT s/p surgery R Hip arthroscopy w femoroplasty and labral repair, femoral osteochondroplasty complete capsular closure arthroscopy on 08/19/21. Patient has been seen prior to surgery by this author for her Hereditary Spastic Paraplegia (HSP). PMh includes bilateral Le edema, BLE weakness, constipation, chronic pain, GERD, tremor, HSP, migraine, neurogenic bladder,seizures, depression, migraine, COVID 19, chronic UTI, urostomy.  Saw physician June 8th. Has been starting gentle walking   PRECAUTIONS: fall, R hip   SUBJECTIVE: Patient missed last session due to UTI, still having severe pain and weakness.   PAIN:  Are you having pain? Yes: NPRS scale: 8/10 Pain location: R hip and low back Pain description: aching Aggravating  factors: movement Relieving factors: rest     TODAY'S TREATMENT:      Supine: Bridge 10x ;  Bridge with BTB abduction 10x BTB hip flexion 20x each LE Bilateral knee abduction BTB 20x LE rotation for low back pain reduction 30 seconds x 3 trials Posterior pelvic tilt 15x 3 second holds Hamstring lengthening stretch 60 seconds each LE Single knee abduction ; inhale and tighten core to return to standard position.    Seated: -GTB alternating LAQ 10x each LE -GTB hamstring curl 15x each LE -GTB alternating abduction/ER 10x each LE  Car transfer with mod A for LE's   PATIENT EDUCATION: Education details: exercise technique, protocol Person educated: Patient Education method: Explanation, Demonstration, Tactile cues, and Verbal cues Education comprehension: verbalized understanding, returned demonstration, verbal cues required, and tactile cues required   HOME EXERCISE PROGRAM: Access Code: [email or web address] URL: [email or web address] Date: 10/05/2021 Prepared by: Precious Bard  Exercises - Supine Posterior Pelvic Tilt  - 1 x daily - 7 x weekly - 2 sets - 10 reps - 5 hold - Supine Transversus Abdominis Bracing - Hands on Thighs  - 1 x daily - 7 x weekly - 2 sets - 10 reps - 5 hold - Supine Transversus Abdominis Bracing with Double Leg Fallout  - 1 x daily - 7 x weekly - 2 sets - 10 reps - 5 hold   PT Short Term Goals       PT SHORT TERM GOAL #1   Title Patient will be independent in home exercise program to improve strength/mobility for better functional independence with ADLs.    Baseline 6/19 give next session    Time 6    Period Weeks    Status New    Target Date 11/14/21      PT SHORT TERM GOAL #2   Title Patient will report a reduction in pain to no greater than 6/10 over the prior week to demonstrate symptom improvement.    Baseline 6/19: 10/10 at worst    Time 6    Period Weeks    Status New    Target Date 11/14/21              PT Long Term Goals       PT LONG TERM GOAL #1   Title Patient will increase FOTO score to equal to or greater than  49   to demonstrate statistically significant improvement in mobility and quality of life    Baseline 6/19: 34    Time 12    Period Weeks    Status New    Target Date 12/26/21      PT LONG TERM GOAL #2   Title Patient will increase 10 meter walk test to <30 seconds as to improve gait speed for better community  ambulation and to reduce fall risk.    Baseline 6/19: unable to perform; walked 1 min 30 seconds for 1/3 of test.    Time 12    Period Weeks    Status New    Target Date 12/26/21      PT LONG TERM GOAL #3   Title Patient (< 85 years old) will complete five times sit to stand test in < 15 seconds indicating an increased LE strength and improved balance.    Baseline 6/19: 33 seconds heavy BUE support    Time 12    Period Weeks    Status New    Target Date 12/26/21      PT LONG TERM  GOAL #4   Title Patient will increase hip strength to 4/5 for functional strength and mobility to increase independence.    Baseline 6/19: flex abd 3/5 add 2+/5    Time 12    Period Weeks    Status New    Target Date 12/26/21      PT LONG TERM GOAL #5   Title Patient will report a worst pain of 3/10 on VAS in R hip  to improve tolerance with ADLs and reduced symptoms with activities.    Baseline 6/19: 10/10    Time 12    Period Weeks    Status New    Target Date 12/26/21              Plan     Clinical Impression Statement Patient presents with excellent motivation throughout session despite discomfort/pain from UTI. She does have increased fatigue this session requiring rest breaks throughout session. Utilization of heating pad in supine position reduced tension in back.  Patient will continue to benefit from skilled PT to address gait deficits, standing and mobility tolerance, and strength to improve quality of life and level of independence with functional mobility and ADLs.   Personal Factors and Comorbidities Comorbidity 3+;Transportation;Time since onset of injury/illness/exacerbation;Finances;Past/Current Experience    Comorbidities bilateral Le edema, BLE weakness, constipation, chronic pain, GERD, tremor, HSP, migraine, neurogenic bladder, chronic UTI, urostomy.    Examination-Activity Limitations Caring for Others;Carry;Continence;Squat;Stairs;Toileting;Locomotion Level;Lift;Stand;Bed  Mobility;Bend;Reach Overhead;Sit;Dressing;Transfers;Bathing;Hygiene/Grooming;Sleep    Examination-Participation Restrictions Church;Cleaning;Community Activity;Driving;Interpersonal Relationship;Laundry;Yard Work;Meal Prep;School;Volunteer;Shop    Stability/Clinical Decision Making Unstable/Unpredictable    Rehab Potential Fair    PT Frequency 2x / week    PT Duration 12 weeks    PT Treatment/Interventions ADLs/Self Care Home Management;Aquatic Therapy;Biofeedback;Moist Heat;Electrical Stimulation;Cryotherapy;Traction;Ultrasound;Therapeutic activities;Functional mobility training;Stair training;Gait training;Therapeutic exercise;Balance training;Neuromuscular re-education;Patient/family education;Manual techniques;Dry needling;Scar mobilization;Energy conservation;Taping;Joint Manipulations;Spinal Manipulations;Visual/perceptual remediation/compensation;Passive range of motion;Wheelchair mobility training;DME Instruction;Orthotic Fit/Training;Iontophoresis 4mg /ml Dexamethasone;Vestibular;Canalith Repostioning    PT Next Visit Plan progression per protocol    PT Home Exercise Plan give next session    Consulted and Agree with Plan of Care Patient             , PT, DPT  11/21/2021, 12:02 PM

## 2021-11-21 ENCOUNTER — Ambulatory Visit: Payer: BC Managed Care – PPO | Attending: Gastroenterology

## 2021-11-21 DIAGNOSIS — R262 Difficulty in walking, not elsewhere classified: Secondary | ICD-10-CM | POA: Diagnosis present

## 2021-11-21 DIAGNOSIS — M6281 Muscle weakness (generalized): Secondary | ICD-10-CM | POA: Insufficient documentation

## 2021-11-21 DIAGNOSIS — M25551 Pain in right hip: Secondary | ICD-10-CM | POA: Insufficient documentation

## 2021-11-23 ENCOUNTER — Ambulatory Visit: Payer: BC Managed Care – PPO

## 2021-11-24 ENCOUNTER — Ambulatory Visit: Payer: BC Managed Care – PPO | Admitting: Physical Therapy

## 2021-11-28 ENCOUNTER — Ambulatory Visit: Payer: BC Managed Care – PPO

## 2021-11-29 NOTE — Therapy (Signed)
OUTPATIENT PHYSICAL THERAPY TREATMENT NOTE   Patient Name: Christine Chavez MRN: [id number] DOB:[birth year], 41 y.o., female Today's Date: 11/30/2021  PCP: Ermalinda Memos MD  REFERRING PROVIDER: Lacy Duverney MD    PT End of Session - 11/30/21 1108     Visit Number 13    Number of Visits 24    Date for PT Re-Evaluation 12/26/21    Authorization Type 3/10 PN 7/24    PT Start Time 1100    PT Stop Time 1144    PT Time Calculation (min) 44 min    Equipment Utilized During Treatment Gait belt    Activity Tolerance Patient tolerated treatment well;Patient limited by fatigue;Patient limited by pain    Behavior During Therapy WFL for tasks assessed/performed                       Past Medical History:  Diagnosis Date   Complication of anesthesia    ? seizures after anesthesia    Headache    Migraines    Neurogenic bladder    Renal disorder    Vision abnormalities    Past Surgical History:  Procedure Laterality Date   ANTERIOR CRUCIATE LIGAMENT REPAIR  1997   APPENDECTOMY     COLONOSCOPY WITH PROPOFOL N/A 11/11/2018   Procedure: COLONOSCOPY WITH PROPOFOL;  Surgeon: Christena Deem, MD;  Location: [facility] ENDOSCOPY;  Service: Endoscopy;  Laterality: N/A;   CYSTOSCOPY WITH STENT PLACEMENT Right 04/17/2016   Procedure: CYSTOSCOPY WITH STENT PLACEMENT;  Surgeon: Malen Gauze, MD;  Location: [facility] ORS;  Service: Urology;  Laterality: Right;   ESOPHAGOGASTRODUODENOSCOPY (EGD) WITH PROPOFOL N/A 11/11/2018   Procedure: ESOPHAGOGASTRODUODENOSCOPY (EGD) WITH PROPOFOL;  Surgeon: Christena Deem, MD;  Location: [facility] ENDOSCOPY;  Service: Endoscopy;  Laterality: N/A;   EXPLORATORY LAPAROTOMY  1999   KIDNEY STONE SURGERY  04/2016   REVISION UROSTOMY CUTANEOUS     REVISION UROSTOMY CUTANEOUS  01/10/2018   SUPRAPUBIC CATHETER PLACEMENT  08/2017   TONSILLECTOMY     Patient Active Problem List   Diagnosis Date Noted   Seizure (HCC) 11/11/2018   Major depressive  disorder, recurrent episode, moderate (HCC) 02/07/2018   Nephrolithiasis 04/16/2016   Numbness 07/28/2015   Bladder retention 06/23/2015   Abdominal pain 06/04/2015   Dizziness 05/18/2015   Neck pain 05/18/2015   Complicated migraine 04/28/2015   Other fatigue 04/28/2015   Abnormal finding on MRI of brain 04/28/2015   D (diarrhea) 03/29/2015   H/O disease 03/29/2015   Abnormal weight loss 03/29/2015   Muscle weakness (generalized) 03/14/2015   Headache, migraine 03/10/2015    REFERRING DIAG: L hip arthroscopy/ HSP   THERAPY DIAG:  Pain in right hip  Difficulty in walking, not elsewhere classified  Muscle weakness (generalized)  Rationale for Evaluation and Treatment Rehabilitation  PERTINENT HISTORY: Patient is returning to PT s/p surgery R Hip arthroscopy w femoroplasty and labral repair, femoral osteochondroplasty complete capsular closure arthroscopy on 08/19/21. Patient has been seen prior to surgery by this author for her Hereditary Spastic Paraplegia (HSP). PMh includes bilateral Le edema, BLE weakness, constipation, chronic pain, GERD, tremor, HSP, migraine, neurogenic bladder,seizures, depression, migraine, COVID 19, chronic UTI, urostomy.  Saw physician June 8th. Has been starting gentle walking   PRECAUTIONS: fall, R hip   SUBJECTIVE:Patient has a UTI, was put on bactrim. Started throwing up on Tuesday.  Had a distended stomach over the weekend but improved by this week. Has significant stiffness in all limbs and back  PAIN:  Are you having pain? Yes: NPRS scale: 8/10 Pain location: R hip and low back Pain description: aching Aggravating factors: movement Relieving factors: rest     TODAY'S TREATMENT:      Supine: Bridge 10x ;  Bridge with BTB abduction 10x BTB hip flexion 20x each LE Bilateral knee abduction BTB 20x LE rotation for low back pain reduction 30 seconds x 3 trials Hamstring lengthening stretch 60 seconds each LE Single knee abduction ;  inhale and tighten core to return to standard position.   Sidelying:  Open book stretch 3x20-30 seconds each side Clamshells 12x each side  Standing: Forward/backwards ambulation with hands on PT shoulder. High level of choreo movement and instability of limbs 6- 62ft x 4 trials Lateral ambulation with hands on PT shoulders. Frequent knee buckling requiring stabilization 4x 6 ft.    PATIENT EDUCATION: Education details: exercise technique, protocol Person educated: Patient Education method: Explanation, Demonstration, Tactile cues, and Verbal cues Education comprehension: verbalized understanding, returned demonstration, verbal cues required, and tactile cues required   HOME EXERCISE PROGRAM: Access Code: [email or web address] URL: [email or web address] Date: 10/05/2021 Prepared by: Precious Bard  Exercises - Supine Posterior Pelvic Tilt  - 1 x daily - 7 x weekly - 2 sets - 10 reps - 5 hold - Supine Transversus Abdominis Bracing - Hands on Thighs  - 1 x daily - 7 x weekly - 2 sets - 10 reps - 5 hold - Supine Transversus Abdominis Bracing with Double Leg Fallout  - 1 x daily - 7 x weekly - 2 sets - 10 reps - 5 hold   PT Short Term Goals       PT SHORT TERM GOAL #1   Title Patient will be independent in home exercise program to improve strength/mobility for better functional independence with ADLs.    Baseline 6/19 give next session    Time 6    Period Weeks    Status New    Target Date 11/14/21      PT SHORT TERM GOAL #2   Title Patient will report a reduction in pain to no greater than 6/10 over the prior week to demonstrate symptom improvement.    Baseline 6/19: 10/10 at worst    Time 6    Period Weeks    Status New    Target Date 11/14/21              PT Long Term Goals       PT LONG TERM GOAL #1   Title Patient will increase FOTO score to equal to or greater than  49   to demonstrate statistically significant improvement in mobility and quality of life     Baseline 6/19: 34    Time 12    Period Weeks    Status New    Target Date 12/26/21      PT LONG TERM GOAL #2   Title Patient will increase 10 meter walk test to <30 seconds as to improve gait speed for better community ambulation and to reduce fall risk.    Baseline 6/19: unable to perform; walked 1 min 30 seconds for 1/3 of test.    Time 12    Period Weeks    Status New    Target Date 12/26/21      PT LONG TERM GOAL #3   Title Patient (< 31 years old) will complete five times sit to stand test in < 15 seconds indicating an increased LE strength and improved balance.  Baseline 6/19: 33 seconds heavy BUE support    Time 12    Period Weeks    Status New    Target Date 12/26/21      PT LONG TERM GOAL #4   Title Patient will increase hip strength to 4/5 for functional strength and mobility to increase independence.    Baseline 6/19: flex abd 3/5 add 2+/5    Time 12    Period Weeks    Status New    Target Date 12/26/21      PT LONG TERM GOAL #5   Title Patient will report a worst pain of 3/10 on VAS in R hip  to improve tolerance with ADLs and reduced symptoms with activities.    Baseline 6/19: 10/10    Time 12    Period Weeks    Status New    Target Date 12/26/21              Plan     Clinical Impression Statement Patient returning after illness. Is aware that she has significant increase in tone thorough body and requires prolonged holds for tension reduction.   She is able to perform some standing interventions but is limited by instability and buckling of limbs. Patient will continue to benefit from skilled PT to address gait deficits, standing and mobility tolerance, and strength to improve quality of life and level of independence with functional mobility and ADLs.   Personal Factors and Comorbidities Comorbidity 3+;Transportation;Time since onset of injury/illness/exacerbation;Finances;Past/Current Experience    Comorbidities bilateral Le edema, BLE weakness,  constipation, chronic pain, GERD, tremor, HSP, migraine, neurogenic bladder, chronic UTI, urostomy.    Examination-Activity Limitations Caring for Others;Carry;Continence;Squat;Stairs;Toileting;Locomotion Level;Lift;Stand;Bed Mobility;Bend;Reach Overhead;Sit;Dressing;Transfers;Bathing;Hygiene/Grooming;Sleep    Examination-Participation Restrictions Church;Cleaning;Community Activity;Driving;Interpersonal Relationship;Laundry;Yard Work;Meal Prep;School;Volunteer;Shop    Stability/Clinical Decision Making Unstable/Unpredictable    Rehab Potential Fair    PT Frequency 2x / week    PT Duration 12 weeks    PT Treatment/Interventions ADLs/Self Care Home Management;Aquatic Therapy;Biofeedback;Moist Heat;Electrical Stimulation;Cryotherapy;Traction;Ultrasound;Therapeutic activities;Functional mobility training;Stair training;Gait training;Therapeutic exercise;Balance training;Neuromuscular re-education;Patient/family education;Manual techniques;Dry needling;Scar mobilization;Energy conservation;Taping;Joint Manipulations;Spinal Manipulations;Visual/perceptual remediation/compensation;Passive range of motion;Wheelchair mobility training;DME Instruction;Orthotic Fit/Training;Iontophoresis 4mg /ml Dexamethasone;Vestibular;Canalith Repostioning    PT Next Visit Plan progression per protocol    PT Home Exercise Plan give next session    Consulted and Agree with Plan of Care Patient             , PT, DPT  11/30/2021, 12:17 PM

## 2021-11-30 ENCOUNTER — Ambulatory Visit: Payer: BC Managed Care – PPO

## 2021-11-30 DIAGNOSIS — M6281 Muscle weakness (generalized): Secondary | ICD-10-CM

## 2021-11-30 DIAGNOSIS — M25551 Pain in right hip: Secondary | ICD-10-CM

## 2021-11-30 DIAGNOSIS — R262 Difficulty in walking, not elsewhere classified: Secondary | ICD-10-CM

## 2021-12-01 ENCOUNTER — Ambulatory Visit: Payer: BC Managed Care – PPO | Admitting: Physical Therapy

## 2021-12-05 ENCOUNTER — Ambulatory Visit: Payer: BC Managed Care – PPO

## 2021-12-07 ENCOUNTER — Ambulatory Visit: Payer: BC Managed Care – PPO

## 2021-12-07 DIAGNOSIS — M25551 Pain in right hip: Secondary | ICD-10-CM

## 2021-12-07 DIAGNOSIS — R262 Difficulty in walking, not elsewhere classified: Secondary | ICD-10-CM

## 2021-12-07 DIAGNOSIS — M6281 Muscle weakness (generalized): Secondary | ICD-10-CM

## 2021-12-07 NOTE — Therapy (Signed)
OUTPATIENT PHYSICAL THERAPY TREATMENT NOTE   Patient Name: Christine Chavez MRN: [id number] DOB:November 22, 1980, 41 y.o., female Today's Date: 12/07/2021  PCP: Elza Rafter MD  REFERRING PROVIDER: Genia Del MD    PT End of Session - 12/07/21 1057     Visit Number 14    Number of Visits 24    Date for PT Re-Evaluation 12/26/21    Authorization Type 4/10 PN 7/24    PT Start Time 1100    PT Stop Time 1144    PT Time Calculation (min) 44 min    Equipment Utilized During Treatment Gait belt    Activity Tolerance Patient tolerated treatment well;Patient limited by fatigue;Patient limited by pain    Behavior During Therapy WFL for tasks assessed/performed                       Past Medical History:  Diagnosis Date   Complication of anesthesia    ? seizures after anesthesia    Headache    Migraines    Neurogenic bladder    Renal disorder    Vision abnormalities    Past Surgical History:  Procedure Laterality Date   ANTERIOR CRUCIATE LIGAMENT REPAIR  1997   APPENDECTOMY     COLONOSCOPY WITH PROPOFOL N/A 11/11/2018   Procedure: COLONOSCOPY WITH PROPOFOL;  Surgeon: [name], MD;  Location: [facility] ENDOSCOPY;  Service: Endoscopy;  Laterality: N/A;   CYSTOSCOPY WITH STENT PLACEMENT Right 04/17/2016   Procedure: CYSTOSCOPY WITH STENT PLACEMENT;  Surgeon: [name], MD;  Location: [facility] ORS;  Service: Urology;  Laterality: Right;   ESOPHAGOGASTRODUODENOSCOPY (EGD) WITH PROPOFOL N/A 11/11/2018   Procedure: ESOPHAGOGASTRODUODENOSCOPY (EGD) WITH PROPOFOL;  Surgeon: [name], MD;  Location: [facility] ENDOSCOPY;  Service: Endoscopy;  Laterality: N/A;   EXPLORATORY LAPAROTOMY  1999   KIDNEY STONE SURGERY  04/2016   REVISION UROSTOMY CUTANEOUS     REVISION UROSTOMY CUTANEOUS  01/10/2018   SUPRAPUBIC CATHETER PLACEMENT  08/2017   TONSILLECTOMY     Patient Active Problem List   Diagnosis Date Noted   Seizure ([place]) 11/11/2018   Major depressive  disorder, recurrent episode, moderate ([place]) 02/07/2018   Nephrolithiasis 04/16/2016   Numbness 07/28/2015   Bladder retention 06/23/2015   Abdominal pain 06/04/2015   Dizziness 05/18/2015   Neck pain 08/67/6195   Complicated migraine 09/32/6712   Other fatigue 04/28/2015   Abnormal finding on MRI of brain 04/28/2015   D (diarrhea) 03/29/2015   H/O disease 03/29/2015   Abnormal weight loss 03/29/2015   Muscle weakness (generalized) 03/14/2015   Headache, migraine 03/10/2015    REFERRING DIAG: L hip arthroscopy/ HSP   THERAPY DIAG:  Pain in right hip  Difficulty in walking, not elsewhere classified  Muscle weakness (generalized)  Rationale for Evaluation and Treatment Rehabilitation  PERTINENT HISTORY: Patient is returning to PT s/p surgery R Hip arthroscopy w femoroplasty and labral repair, femoral osteochondroplasty complete capsular closure arthroscopy on 08/19/21. Patient has been seen prior to surgery by this author for her Hereditary Spastic Paraplegia (HSP). PMh includes bilateral Le edema, BLE weakness, constipation, chronic pain, GERD, tremor, HSP, migraine, neurogenic bladder,seizures, depression, migraine, COVID 19, chronic UTI, urostomy.  Saw physician June 8th. Has been starting gentle walking   PRECAUTIONS: fall, R hip   SUBJECTIVE: Patient has met with new PCP. Will see second opinion urogynecology in October. Has upcoming secondary hip surgery. Was in the heat yesterday and overheated, has start of migraine at PT session.  PAIN:  Are you having pain? Yes: NPRS scale: 8/10 Pain location: R hip and low back Pain description: aching Aggravating factors: movement Relieving factors: rest     TODAY'S TREATMENT:      Supine: Bridge 10x ;  3.5 ankle weights: hip flexion 20x each LE Bilateral knee abduction BTB 20x LE rotation for low back pain reduction 30 seconds x 3 trials Hamstring lengthening stretch 60 seconds each LE Single knee abduction ; inhale and  tighten core to return to standard position.  Posterior pelvic tilts 15x  Sidelying:  Open book stretch 3x20-30 seconds each side Clamshells 15x each side  Seated: 2.5 ankle weights: LAQ 15x   PATIENT EDUCATION: Education details: exercise technique, protocol Person educated: Patient Education method: Explanation, Demonstration, Tactile cues, and Verbal cues Education comprehension: verbalized understanding, returned demonstration, verbal cues required, and tactile cues required   HOME EXERCISE PROGRAM: Access Code: [email or web address] URL: [email or web address] Date: 10/05/2021 Prepared by: Precious Bard  Exercises - Supine Posterior Pelvic Tilt  - 1 x daily - 7 x weekly - 2 sets - 10 reps - 5 hold - Supine Transversus Abdominis Bracing - Hands on Thighs  - 1 x daily - 7 x weekly - 2 sets - 10 reps - 5 hold - Supine Transversus Abdominis Bracing with Double Leg Fallout  - 1 x daily - 7 x weekly - 2 sets - 10 reps - 5 hold   PT Short Term Goals       PT SHORT TERM GOAL #1   Title Patient will be independent in home exercise program to improve strength/mobility for better functional independence with ADLs.    Baseline 6/19 give next session    Time 6    Period Weeks    Status New    Target Date 11/14/21      PT SHORT TERM GOAL #2   Title Patient will report a reduction in pain to no greater than 6/10 over the prior week to demonstrate symptom improvement.    Baseline 6/19: 10/10 at worst    Time 6    Period Weeks    Status New    Target Date 11/14/21              PT Long Term Goals       PT LONG TERM GOAL #1   Title Patient will increase FOTO score to equal to or greater than  49   to demonstrate statistically significant improvement in mobility and quality of life    Baseline 6/19: 34    Time 12    Period Weeks    Status New    Target Date 12/26/21      PT LONG TERM GOAL #2   Title Patient will increase 10 meter walk test to <30 seconds as to improve  gait speed for better community ambulation and to reduce fall risk.    Baseline 6/19: unable to perform; walked 1 min 30 seconds for 1/3 of test.    Time 12    Period Weeks    Status New    Target Date 12/26/21      PT LONG TERM GOAL #3   Title Patient (< 67 years old) will complete five times sit to stand test in < 15 seconds indicating an increased LE strength and improved balance.    Baseline 6/19: 33 seconds heavy BUE support    Time 12    Period Weeks    Status New    Target Date 12/26/21  PT LONG TERM GOAL #4   Title Patient will increase hip strength to 4/5 for functional strength and mobility to increase independence.    Baseline 6/19: flex abd 3/5 add 2+/5    Time 12    Period Weeks    Status New    Target Date 12/26/21      PT LONG TERM GOAL #5   Title Patient will report a worst pain of 3/10 on VAS in R hip  to improve tolerance with ADLs and reduced symptoms with activities.    Baseline 6/19: 10/10    Time 12    Period Weeks    Status New    Target Date 12/26/21              Plan     Clinical Impression Statement Patient unable to tolerate standing due to migraine and ongoing infection symptoms. She remains highly motivated despite deficits and is able to tolerate seated and supine/sidelying interventions. Patient will continue to benefit from skilled PT to address gait deficits, standing and mobility tolerance, and strength to improve quality of life and level of independence with functional mobility and ADLs.   Personal Factors and Comorbidities Comorbidity 3+;Transportation;Time since onset of injury/illness/exacerbation;Finances;Past/Current Experience    Comorbidities bilateral Le edema, BLE weakness, constipation, chronic pain, GERD, tremor, HSP, migraine, neurogenic bladder, chronic UTI, urostomy.    Examination-Activity Limitations Caring for Others;Carry;Continence;Squat;Stairs;Toileting;Locomotion Level;Lift;Stand;Bed Mobility;Bend;Reach  Overhead;Sit;Dressing;Transfers;Bathing;Hygiene/Grooming;Sleep    Examination-Participation Restrictions Church;Cleaning;Community Activity;Driving;Interpersonal Relationship;Laundry;Yard Work;Meal Prep;School;Volunteer;Shop    Stability/Clinical Decision Making Unstable/Unpredictable    Rehab Potential Fair    PT Frequency 2x / week    PT Duration 12 weeks    PT Treatment/Interventions ADLs/Self Care Home Management;Aquatic Therapy;Biofeedback;Moist Heat;Electrical Stimulation;Cryotherapy;Traction;Ultrasound;Therapeutic activities;Functional mobility training;Stair training;Gait training;Therapeutic exercise;Balance training;Neuromuscular re-education;Patient/family education;Manual techniques;Dry needling;Scar mobilization;Energy conservation;Taping;Joint Manipulations;Spinal Manipulations;Visual/perceptual remediation/compensation;Passive range of motion;Wheelchair mobility training;DME Instruction;Orthotic Fit/Training;Iontophoresis 4mg /ml Dexamethasone;Vestibular;Canalith Repostioning    PT Next Visit Plan progression per protocol    PT Home Exercise Plan give next session    Consulted and Agree with Plan of Care Patient             Janna Arch, PT, DPT  12/07/2021, 12:27 PM

## 2021-12-08 ENCOUNTER — Ambulatory Visit: Payer: BC Managed Care – PPO | Admitting: Physical Therapy

## 2021-12-12 ENCOUNTER — Ambulatory Visit: Payer: BC Managed Care – PPO

## 2021-12-12 DIAGNOSIS — M25551 Pain in right hip: Secondary | ICD-10-CM

## 2021-12-12 DIAGNOSIS — M6281 Muscle weakness (generalized): Secondary | ICD-10-CM

## 2021-12-12 DIAGNOSIS — R262 Difficulty in walking, not elsewhere classified: Secondary | ICD-10-CM

## 2021-12-12 NOTE — Therapy (Signed)
OUTPATIENT PHYSICAL THERAPY TREATMENT NOTE   Patient Name: Christine Chavez MRN: [id number] DOB:Jun 26, 1980, 41 y.o., female Today's Date: 12/12/2021  PCP: Ermalinda Memos MD  REFERRING PROVIDER: Lacy Duverney MD    PT End of Session - 12/12/21 1057     Visit Number 15    Number of Visits 24    Date for PT Re-Evaluation 12/26/21    Authorization Type 5/10 PN 7/24    PT Start Time 1100    PT Stop Time 1144    PT Time Calculation (min) 44 min    Equipment Utilized During Treatment Gait belt    Activity Tolerance Patient tolerated treatment well;Patient limited by fatigue;Patient limited by pain    Behavior During Therapy WFL for tasks assessed/performed                        Past Medical History:  Diagnosis Date   Complication of anesthesia    ? seizures after anesthesia    Headache    Migraines    Neurogenic bladder    Renal disorder    Vision abnormalities    Past Surgical History:  Procedure Laterality Date   ANTERIOR CRUCIATE LIGAMENT REPAIR  1997   APPENDECTOMY     COLONOSCOPY WITH PROPOFOL N/A 11/11/2018   Procedure: COLONOSCOPY WITH PROPOFOL;  Surgeon: Christena Deem, MD;  Location: [facility] ENDOSCOPY;  Service: Endoscopy;  Laterality: N/A;   CYSTOSCOPY WITH STENT PLACEMENT Right 04/17/2016   Procedure: CYSTOSCOPY WITH STENT PLACEMENT;  Surgeon: Malen Gauze, MD;  Location: [facility] ORS;  Service: Urology;  Laterality: Right;   ESOPHAGOGASTRODUODENOSCOPY (EGD) WITH PROPOFOL N/A 11/11/2018   Procedure: ESOPHAGOGASTRODUODENOSCOPY (EGD) WITH PROPOFOL;  Surgeon: Christena Deem, MD;  Location: [facility] ENDOSCOPY;  Service: Endoscopy;  Laterality: N/A;   EXPLORATORY LAPAROTOMY  1999   KIDNEY STONE SURGERY  04/2016   REVISION UROSTOMY CUTANEOUS     REVISION UROSTOMY CUTANEOUS  01/10/2018   SUPRAPUBIC CATHETER PLACEMENT  08/2017   TONSILLECTOMY     Patient Active Problem List   Diagnosis Date Noted   Seizure (HCC) 11/11/2018   Major depressive  disorder, recurrent episode, moderate (HCC) 02/07/2018   Nephrolithiasis 04/16/2016   Numbness 07/28/2015   Bladder retention 06/23/2015   Abdominal pain 06/04/2015   Dizziness 05/18/2015   Neck pain 05/18/2015   Complicated migraine 04/28/2015   Other fatigue 04/28/2015   Abnormal finding on MRI of brain 04/28/2015   D (diarrhea) 03/29/2015   H/O disease 03/29/2015   Abnormal weight loss 03/29/2015   Muscle weakness (generalized) 03/14/2015   Headache, migraine 03/10/2015    REFERRING DIAG: L hip arthroscopy/ HSP   THERAPY DIAG:  Pain in right hip  Difficulty in walking, not elsewhere classified  Muscle weakness (generalized)  Rationale for Evaluation and Treatment Rehabilitation  PERTINENT HISTORY: Patient is returning to PT s/p surgery R Hip arthroscopy w femoroplasty and labral repair, femoral osteochondroplasty complete capsular closure arthroscopy on 08/19/21. Patient has been seen prior to surgery by this author for her Hereditary Spastic Paraplegia (HSP). PMh includes bilateral Le edema, BLE weakness, constipation, chronic pain, GERD, tremor, HSP, migraine, neurogenic bladder,seizures, depression, migraine, COVID 19, chronic UTI, urostomy.  Saw physician June 8th. Has been starting gentle walking   PRECAUTIONS: fall, R hip   SUBJECTIVE: Patient has her pre-op appointment on Thursday. Surgery is set for September 13th.   PAIN:  Are you having pain? Yes: NPRS scale: 8/10 Pain location: R hip and low back Pain description:  aching Aggravating factors: movement Relieving factors: rest     TODAY'S TREATMENT:    Standing:  Forward walk length of // bars, backwards walk back to start x4 trials with seated rest breaks Lateral stepping 4x length of // bars with seated rest breaks.   Supine: Bridge 10x ;  LE rotation for low back pain reduction 30 seconds x 3 trials Hamstring lengthening stretch 60 seconds each LE Posterior pelvic tilts 15x Posterior pelvic tilt  with adduction ball squeeze 10x Bridge 10x with adduction ball squeeze  Sidelying:  Clamshells 15x each side  Seated:  LAQ 15x  March 15x  PATIENT EDUCATION: Education details: exercise technique, protocol Person educated: Patient Education method: Explanation, Demonstration, Tactile cues, and Verbal cues Education comprehension: verbalized understanding, returned demonstration, verbal cues required, and tactile cues required   HOME EXERCISE PROGRAM: Access Code: [email or web address] URL: [email or web address] Date: 10/05/2021 Prepared by: Precious Bard  Exercises - Supine Posterior Pelvic Tilt  - 1 x daily - 7 x weekly - 2 sets - 10 reps - 5 hold - Supine Transversus Abdominis Bracing - Hands on Thighs  - 1 x daily - 7 x weekly - 2 sets - 10 reps - 5 hold - Supine Transversus Abdominis Bracing with Double Leg Fallout  - 1 x daily - 7 x weekly - 2 sets - 10 reps - 5 hold   PT Short Term Goals       PT SHORT TERM GOAL #1   Title Patient will be independent in home exercise program to improve strength/mobility for better functional independence with ADLs.    Baseline 6/19 give next session    Time 6    Period Weeks    Status New    Target Date 11/14/21      PT SHORT TERM GOAL #2   Title Patient will report a reduction in pain to no greater than 6/10 over the prior week to demonstrate symptom improvement.    Baseline 6/19: 10/10 at worst    Time 6    Period Weeks    Status New    Target Date 11/14/21              PT Long Term Goals       PT LONG TERM GOAL #1   Title Patient will increase FOTO score to equal to or greater than  49   to demonstrate statistically significant improvement in mobility and quality of life    Baseline 6/19: 34    Time 12    Period Weeks    Status New    Target Date 12/26/21      PT LONG TERM GOAL #2   Title Patient will increase 10 meter walk test to <30 seconds as to improve gait speed for better community ambulation and to  reduce fall risk.    Baseline 6/19: unable to perform; walked 1 min 30 seconds for 1/3 of test.    Time 12    Period Weeks    Status New    Target Date 12/26/21      PT LONG TERM GOAL #3   Title Patient (< 43 years old) will complete five times sit to stand test in < 15 seconds indicating an increased LE strength and improved balance.    Baseline 6/19: 33 seconds heavy BUE support    Time 12    Period Weeks    Status New    Target Date 12/26/21      PT LONG TERM GOAL #  4   Title Patient will increase hip strength to 4/5 for functional strength and mobility to increase independence.    Baseline 6/19: flex abd 3/5 add 2+/5    Time 12    Period Weeks    Status New    Target Date 12/26/21      PT LONG TERM GOAL #5   Title Patient will report a worst pain of 3/10 on VAS in R hip  to improve tolerance with ADLs and reduced symptoms with activities.    Baseline 6/19: 10/10    Time 12    Period Weeks    Status New    Target Date 12/26/21              Plan     Clinical Impression Statement Patient tolerates standing interventions well today with intermittent rest breaks. Patient has more stability within // bars however does continue to have bilateral knee buckling with fatigue. Backwards ambulation more coordinated and stable than forward walking this session. Patient will continue to benefit from skilled PT to address gait deficits, standing and mobility tolerance, and strength to improve quality of life and level of independence with functional mobility and ADLs.   Personal Factors and Comorbidities Comorbidity 3+;Transportation;Time since onset of injury/illness/exacerbation;Finances;Past/Current Experience    Comorbidities bilateral Le edema, BLE weakness, constipation, chronic pain, GERD, tremor, HSP, migraine, neurogenic bladder, chronic UTI, urostomy.    Examination-Activity Limitations Caring for Others;Carry;Continence;Squat;Stairs;Toileting;Locomotion Level;Lift;Stand;Bed  Mobility;Bend;Reach Overhead;Sit;Dressing;Transfers;Bathing;Hygiene/Grooming;Sleep    Examination-Participation Restrictions Church;Cleaning;Community Activity;Driving;Interpersonal Relationship;Laundry;Yard Work;Meal Prep;School;Volunteer;Shop    Stability/Clinical Decision Making Unstable/Unpredictable    Rehab Potential Fair    PT Frequency 2x / week    PT Duration 12 weeks    PT Treatment/Interventions ADLs/Self Care Home Management;Aquatic Therapy;Biofeedback;Moist Heat;Electrical Stimulation;Cryotherapy;Traction;Ultrasound;Therapeutic activities;Functional mobility training;Stair training;Gait training;Therapeutic exercise;Balance training;Neuromuscular re-education;Patient/family education;Manual techniques;Dry needling;Scar mobilization;Energy conservation;Taping;Joint Manipulations;Spinal Manipulations;Visual/perceptual remediation/compensation;Passive range of motion;Wheelchair mobility training;DME Instruction;Orthotic Fit/Training;Iontophoresis 4mg /ml Dexamethasone;Vestibular;Canalith Repostioning    PT Next Visit Plan progression per protocol    PT Home Exercise Plan give next session    Consulted and Agree with Plan of Care Patient             Janna Arch, PT, DPT  12/12/2021, 12:10 PM

## 2021-12-13 NOTE — Therapy (Signed)
OUTPATIENT PHYSICAL THERAPY TREATMENT NOTE   Patient Name: Christine Chavez MRN: [id number] DOB:May 27, 1980, 41 y.o., female Today's Date: 12/14/2021  PCP: Ermalinda Memos MD  REFERRING PROVIDER: Lacy Duverney MD    PT End of Session - 12/14/21 1443     Visit Number 16    Number of Visits 24    Date for PT Re-Evaluation 12/26/21    Authorization Type 6/10 PN 7/24    PT Start Time 1445    PT Stop Time 1530    PT Time Calculation (min) 45 min    Equipment Utilized During Treatment Gait belt    Activity Tolerance Patient tolerated treatment well;Patient limited by fatigue;Patient limited by pain    Behavior During Therapy WFL for tasks assessed/performed                         Past Medical History:  Diagnosis Date   Complication of anesthesia    ? seizures after anesthesia    Headache    Migraines    Neurogenic bladder    Renal disorder    Vision abnormalities    Past Surgical History:  Procedure Laterality Date   ANTERIOR CRUCIATE LIGAMENT REPAIR  1997   APPENDECTOMY     COLONOSCOPY WITH PROPOFOL N/A 11/11/2018   Procedure: COLONOSCOPY WITH PROPOFOL;  Surgeon: Christena Deem, MD;  Location: [facility] ENDOSCOPY;  Service: Endoscopy;  Laterality: N/A;   CYSTOSCOPY WITH STENT PLACEMENT Right 04/17/2016   Procedure: CYSTOSCOPY WITH STENT PLACEMENT;  Surgeon: Malen Gauze, MD;  Location: [facility] ORS;  Service: Urology;  Laterality: Right;   ESOPHAGOGASTRODUODENOSCOPY (EGD) WITH PROPOFOL N/A 11/11/2018   Procedure: ESOPHAGOGASTRODUODENOSCOPY (EGD) WITH PROPOFOL;  Surgeon: Christena Deem, MD;  Location: [facility] ENDOSCOPY;  Service: Endoscopy;  Laterality: N/A;   EXPLORATORY LAPAROTOMY  1999   KIDNEY STONE SURGERY  04/2016   REVISION UROSTOMY CUTANEOUS     REVISION UROSTOMY CUTANEOUS  01/10/2018   SUPRAPUBIC CATHETER PLACEMENT  08/2017   TONSILLECTOMY     Patient Active Problem List   Diagnosis Date Noted   Seizure (HCC) 11/11/2018   Major  depressive disorder, recurrent episode, moderate (HCC) 02/07/2018   Nephrolithiasis 04/16/2016   Numbness 07/28/2015   Bladder retention 06/23/2015   Abdominal pain 06/04/2015   Dizziness 05/18/2015   Neck pain 05/18/2015   Complicated migraine 04/28/2015   Other fatigue 04/28/2015   Abnormal finding on MRI of brain 04/28/2015   D (diarrhea) 03/29/2015   H/O disease 03/29/2015   Abnormal weight loss 03/29/2015   Muscle weakness (generalized) 03/14/2015   Headache, migraine 03/10/2015    REFERRING DIAG: L hip arthroscopy/ HSP   THERAPY DIAG:  Pain in right hip  Difficulty in walking, not elsewhere classified  Muscle weakness (generalized)  Rationale for Evaluation and Treatment Rehabilitation  PERTINENT HISTORY: Patient is returning to PT s/p surgery R Hip arthroscopy w femoroplasty and labral repair, femoral osteochondroplasty complete capsular closure arthroscopy on 08/19/21. Patient has been seen prior to surgery by this author for her Hereditary Spastic Paraplegia (HSP). PMh includes bilateral Le edema, BLE weakness, constipation, chronic pain, GERD, tremor, HSP, migraine, neurogenic bladder,seizures, depression, migraine, COVID 19, chronic UTI, urostomy.  Saw physician June 8th. Has been starting gentle walking   PRECAUTIONS: fall, R hip   SUBJECTIVE: Patient had very bad bladder spasms. Still seeing blood in urine pre-period; unsure if it is a kidney stone and/or continuation of infection. . Has pre-op appointment this week.   PAIN:  Are you having pain? Yes: NPRS scale: 8/10 Pain location: R hip and low back Pain description: aching Aggravating factors: movement Relieving factors: rest     TODAY'S TREATMENT:    Supine: Bridge 10x ;  LE rotation for low back pain reduction 30 seconds x 3 trials Hamstring lengthening stretch 60 seconds each LE Figure 4 stretch 30 seconds each LE  Posterior pelvic tilts 15x Posterior pelvic tilt with adduction ball squeeze  10x Bridge 10x with adduction ball squeeze GTB abduction 20x GTB march 20x  Sidelying:  Clamshells 15x each side  Seated:  LAQ 15x  March 15x GTB hamstring curl 15x   PATIENT EDUCATION: Education details: exercise technique, protocol Person educated: Patient Education method: Explanation, Demonstration, Tactile cues, and Verbal cues Education comprehension: verbalized understanding, returned demonstration, verbal cues required, and tactile cues required   HOME EXERCISE PROGRAM: Access Code: [email or web address] URL: [email or web address] Date: 10/05/2021 Prepared by: Precious Bard  Exercises - Supine Posterior Pelvic Tilt  - 1 x daily - 7 x weekly - 2 sets - 10 reps - 5 hold - Supine Transversus Abdominis Bracing - Hands on Thighs  - 1 x daily - 7 x weekly - 2 sets - 10 reps - 5 hold - Supine Transversus Abdominis Bracing with Double Leg Fallout  - 1 x daily - 7 x weekly - 2 sets - 10 reps - 5 hold   PT Short Term Goals       PT SHORT TERM GOAL #1   Title Patient will be independent in home exercise program to improve strength/mobility for better functional independence with ADLs.    Baseline 6/19 give next session    Time 6    Period Weeks    Status New    Target Date 11/14/21      PT SHORT TERM GOAL #2   Title Patient will report a reduction in pain to no greater than 6/10 over the prior week to demonstrate symptom improvement.    Baseline 6/19: 10/10 at worst    Time 6    Period Weeks    Status New    Target Date 11/14/21              PT Long Term Goals       PT LONG TERM GOAL #1   Title Patient will increase FOTO score to equal to or greater than  49   to demonstrate statistically significant improvement in mobility and quality of life    Baseline 6/19: 34    Time 12    Period Weeks    Status New    Target Date 12/26/21      PT LONG TERM GOAL #2   Title Patient will increase 10 meter walk test to <30 seconds as to improve gait speed for better  community ambulation and to reduce fall risk.    Baseline 6/19: unable to perform; walked 1 min 30 seconds for 1/3 of test.    Time 12    Period Weeks    Status New    Target Date 12/26/21      PT LONG TERM GOAL #3   Title Patient (< 23 years old) will complete five times sit to stand test in < 15 seconds indicating an increased LE strength and improved balance.    Baseline 6/19: 33 seconds heavy BUE support    Time 12    Period Weeks    Status New    Target Date 12/26/21  PT LONG TERM GOAL #4   Title Patient will increase hip strength to 4/5 for functional strength and mobility to increase independence.    Baseline 6/19: flex abd 3/5 add 2+/5    Time 12    Period Weeks    Status New    Target Date 12/26/21      PT LONG TERM GOAL #5   Title Patient will report a worst pain of 3/10 on VAS in R hip  to improve tolerance with ADLs and reduced symptoms with activities.    Baseline 6/19: 10/10    Time 12    Period Weeks    Status New    Target Date 12/26/21              Plan     Clinical Impression Statement Patient will miss next session due to conflicting appointments. Patient has pre op upcoming for future surgery. Multiple rest breaks required due to possible infection resulting in fatigue.  Patient will continue to benefit from skilled PT to address gait deficits, standing and mobility tolerance, and strength to improve quality of life and level of independence with functional mobility and ADLs.   Personal Factors and Comorbidities Comorbidity 3+;Transportation;Time since onset of injury/illness/exacerbation;Finances;Past/Current Experience    Comorbidities bilateral Le edema, BLE weakness, constipation, chronic pain, GERD, tremor, HSP, migraine, neurogenic bladder, chronic UTI, urostomy.    Examination-Activity Limitations Caring for Others;Carry;Continence;Squat;Stairs;Toileting;Locomotion Level;Lift;Stand;Bed Mobility;Bend;Reach  Overhead;Sit;Dressing;Transfers;Bathing;Hygiene/Grooming;Sleep    Examination-Participation Restrictions Church;Cleaning;Community Activity;Driving;Interpersonal Relationship;Laundry;Yard Work;Meal Prep;School;Volunteer;Shop    Stability/Clinical Decision Making Unstable/Unpredictable    Rehab Potential Fair    PT Frequency 2x / week    PT Duration 12 weeks    PT Treatment/Interventions ADLs/Self Care Home Management;Aquatic Therapy;Biofeedback;Moist Heat;Electrical Stimulation;Cryotherapy;Traction;Ultrasound;Therapeutic activities;Functional mobility training;Stair training;Gait training;Therapeutic exercise;Balance training;Neuromuscular re-education;Patient/family education;Manual techniques;Dry needling;Scar mobilization;Energy conservation;Taping;Joint Manipulations;Spinal Manipulations;Visual/perceptual remediation/compensation;Passive range of motion;Wheelchair mobility training;DME Instruction;Orthotic Fit/Training;Iontophoresis 4mg /ml Dexamethasone;Vestibular;Canalith Repostioning    PT Next Visit Plan progression per protocol    PT Home Exercise Plan give next session    Consulted and Agree with Plan of Care Patient             , PT, DPT  12/14/2021, 3:32 PM

## 2021-12-14 ENCOUNTER — Ambulatory Visit: Payer: BC Managed Care – PPO

## 2021-12-14 DIAGNOSIS — M6281 Muscle weakness (generalized): Secondary | ICD-10-CM

## 2021-12-14 DIAGNOSIS — R262 Difficulty in walking, not elsewhere classified: Secondary | ICD-10-CM

## 2021-12-14 DIAGNOSIS — M25551 Pain in right hip: Secondary | ICD-10-CM

## 2021-12-15 ENCOUNTER — Ambulatory Visit: Payer: BC Managed Care – PPO | Admitting: Physical Therapy

## 2021-12-22 ENCOUNTER — Ambulatory Visit: Payer: BC Managed Care – PPO | Admitting: Physical Therapy

## 2021-12-26 ENCOUNTER — Ambulatory Visit: Payer: BC Managed Care – PPO

## 2021-12-28 ENCOUNTER — Ambulatory Visit: Payer: BC Managed Care – PPO

## 2021-12-29 ENCOUNTER — Encounter: Payer: BC Managed Care – PPO | Admitting: Physical Therapy

## 2022-01-02 ENCOUNTER — Telehealth: Payer: Self-pay

## 2022-01-02 ENCOUNTER — Ambulatory Visit: Payer: BC Managed Care – PPO

## 2022-01-02 NOTE — Telephone Encounter (Signed)
Pt did not show nor contact clinic regarding absence for scheduled PT visit at 11:00 this date. Pt reports she had surgery on her hip and DC back to home on Friday. Pt reports she is unclear on timeframe at present, but with prior surgery MD wanted her to have HHPT for first 6 weeks. Author informed pt she will need to be DC from HHPT and have a new referral for OPPT prior to starting back.   11:30 AM, 01/02/22 Etta Grandchild, PT, DPT Physical Therapist - [place] [phone]

## 2022-01-04 ENCOUNTER — Ambulatory Visit: Payer: BC Managed Care – PPO

## 2022-01-05 ENCOUNTER — Encounter: Payer: BC Managed Care – PPO | Admitting: Physical Therapy

## 2022-01-09 ENCOUNTER — Ambulatory Visit: Payer: BC Managed Care – PPO

## 2022-01-12 ENCOUNTER — Encounter: Payer: BC Managed Care – PPO | Admitting: Physical Therapy

## 2022-01-16 ENCOUNTER — Ambulatory Visit: Payer: BC Managed Care – PPO

## 2022-01-19 ENCOUNTER — Encounter: Payer: BC Managed Care – PPO | Admitting: Physical Therapy

## 2022-01-23 ENCOUNTER — Ambulatory Visit: Payer: BC Managed Care – PPO

## 2022-01-26 ENCOUNTER — Encounter: Payer: BC Managed Care – PPO | Admitting: Physical Therapy

## 2022-01-30 ENCOUNTER — Ambulatory Visit: Payer: BC Managed Care – PPO

## 2022-02-02 ENCOUNTER — Encounter: Payer: BC Managed Care – PPO | Admitting: Physical Therapy

## 2022-02-06 ENCOUNTER — Ambulatory Visit: Payer: BC Managed Care – PPO

## 2022-02-09 ENCOUNTER — Encounter: Payer: BC Managed Care – PPO | Admitting: Physical Therapy

## 2022-02-13 ENCOUNTER — Ambulatory Visit: Payer: BC Managed Care – PPO

## 2022-02-16 ENCOUNTER — Encounter: Payer: BC Managed Care – PPO | Admitting: Physical Therapy

## 2022-02-16 NOTE — Therapy (Signed)
OUTPATIENT PHYSICAL THERAPY LOWER EXTREMITY EVALUATION   Patient Name: Christine Chavez MRN: [id number] DOB:May 07, 1980, 41 y.o., female Today's Date: 02/20/2022   PT End of Session - 02/20/22 1205     Visit Number 1    Number of Visits 24    Date for PT Re-Evaluation 05/15/22    PT Start Time 1102    PT Stop Time 1156    PT Time Calculation (min) 54 min    Equipment Utilized During Treatment Gait belt    Activity Tolerance Patient tolerated treatment well;Patient limited by pain    Behavior During Therapy WFL for tasks assessed/performed             Past Medical History:  Diagnosis Date   Complication of anesthesia    ? seizures after anesthesia    Headache    Migraines    Neurogenic bladder    Renal disorder    Vision abnormalities    Past Surgical History:  Procedure Laterality Date   ANTERIOR CRUCIATE LIGAMENT REPAIR  1997   APPENDECTOMY     COLONOSCOPY WITH PROPOFOL N/A 11/11/2018   Procedure: COLONOSCOPY WITH PROPOFOL;  Surgeon: Christena Deem, MD;  Location: [facility] ENDOSCOPY;  Service: Endoscopy;  Laterality: N/A;   CYSTOSCOPY WITH STENT PLACEMENT Right 04/17/2016   Procedure: CYSTOSCOPY WITH STENT PLACEMENT;  Surgeon: Malen Gauze, MD;  Location: [facility] ORS;  Service: Urology;  Laterality: Right;   ESOPHAGOGASTRODUODENOSCOPY (EGD) WITH PROPOFOL N/A 11/11/2018   Procedure: ESOPHAGOGASTRODUODENOSCOPY (EGD) WITH PROPOFOL;  Surgeon: Christena Deem, MD;  Location: [facility] ENDOSCOPY;  Service: Endoscopy;  Laterality: N/A;   EXPLORATORY LAPAROTOMY  1999   KIDNEY STONE SURGERY  04/2016   REVISION UROSTOMY CUTANEOUS     REVISION UROSTOMY CUTANEOUS  01/10/2018   SUPRAPUBIC CATHETER PLACEMENT  08/2017   TONSILLECTOMY     Patient Active Problem List   Diagnosis Date Noted   Seizure (HCC) 11/11/2018   Major depressive disorder, recurrent episode, moderate (HCC) 02/07/2018   Nephrolithiasis 04/16/2016   Numbness 07/28/2015   Bladder retention 06/23/2015    Abdominal pain 06/04/2015   Dizziness 05/18/2015   Neck pain 05/18/2015   Complicated migraine 04/28/2015   Other fatigue 04/28/2015   Abnormal finding on MRI of brain 04/28/2015   D (diarrhea) 03/29/2015   H/O disease 03/29/2015   Abnormal weight loss 03/29/2015   Muscle weakness (generalized) 03/14/2015   Headache, migraine 03/10/2015    PCP: Ermalinda Memos MD  REFERRING PROVIDER: Lacy Duverney MD  REFERRING DIAG: s/p hip surgery, degenerative tear of acetabular labrum   THERAPY DIAG:  Pain in left hip  Muscle weakness (generalized)  Difficulty in walking, not elsewhere classified  Rationale for Evaluation and Treatment: Rehabilitation  ONSET DATE: 12/28/21  SUBJECTIVE:   SUBJECTIVE STATEMENT: Patient returning back to PT s/p L hip surgery 12/28/21  PERTINENT HISTORY: Patient is returning to PT s/p L hip surgery 12/28/21.  Patient received HHPT afterwards. 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 Patient is going to get a whole scope of bladder 03/16/22. Patient has been seen prior to surgery by this author for her Hereditary Spastic Paraplegia (HSP). PMH includes bilateral Le edema, BLE weakness, constipation, chronic pain, GERD, tremor, HSP, migraine, neurogenic bladder,seizures, depression, migraine, COVID 19, chronic UTI, urostomy. Patient is on protocol of 50% WB until week 8.  PAIN:  Are you having pain? Yes: NPRS scale: 7/10 Pain location: bladder Pain description: spasm Aggravating factors: n/a Relieving  factors: n/a   PRECAUTIONS: Anterior hip and Fall  WEIGHT BEARING RESTRICTIONS: Yes WBAT  at 8 weeks, 50% WB today.   FALLS:  Has patient fallen in last 6 months? No  LIVING ENVIRONMENT: Lives with: lives with their family Lives in: House/apartment Stairs: Yes: External: 0 steps; ramp Has following equipment at home: Walker - 4 wheeled and Wheelchair (manual)    PLOF:  Independent with basic ADLs  PATIENT GOALS: to be able to ADLs independently.    OBJECTIVE:   DIAGNOSTIC FINDINGS: s/p surgery  PATIENT SURVEYS:  FOTO 34  COGNITION: Overall cognitive status: Within functional limits for tasks assessed     SENSATION: Light touch: Impaired L lateral thigh      POSTURE: No Significant postural limitations  PALPATION: Tender at post surgical site  LOWER EXTREMITY ROM:  Active ROM Right eval Left eval  Hip flexion PROM 70 AROM 35 PROM 65 AROM 5  Hip abduction PROM 25 AROM 23 PROM 35 AROM 4    (Blank rows = not tested)  LOWER EXTREMITY MMT:  MMT Right eval Left eval  Hip flexion 4/5 3+/5  Hip extension    Hip abduction 4-/5 3/5  Hip adduction 3+/5 3/5  Hip internal rotation    Hip external rotation    Knee flexion 4-/5 3+/5 Painful  Knee extension 4-/5 3/5  Ankle dorsiflexion 3+/5 3/5  Ankle plantarflexion 3+/5 3/5  Ankle inversion    Ankle eversion     (Blank rows = not tested)  FUNCTIONAL TESTS:  5 times sit to stand: perform next session when she will be WBAT  SPT: 50% WB on LLE; Min A for performance  Supine<>sit CGA with min A for LLE   GAIT: Will assess next session when WBAT   TODAY'S TREATMENT:                                                                                                                              DATE: 02/20/22 Evaluation performed     PATIENT EDUCATION:  Education details: goals, POC  Person educated: Patient Education method: Explanation Education comprehension: verbalized understanding and returned demonstration  HOME EXERCISE PROGRAM: Give next session  ASSESSMENT:  CLINICAL IMPRESSION: Patient is a 41 y.o. female who was seen today for physical therapy evaluation and treatment for L hip s/p surgery for L hip arthroscopy with labral repairs performed 12/2021. Her R hip was performed 07/2021. Patient is still 50% weightbearing until 02/22/2022 when she will be WBAT. Ambulation  and 5x STS will be performed next session once she is allowed WBAT. Patient does have concurrent HSP in addition to bladder dysfunction resulting in neurological complications. Patient will benefit from skilled physical therapy to increase functional mobility, strength, and quality of life.   OBJECTIVE IMPAIRMENTS: Abnormal gait, decreased activity tolerance, decreased balance, decreased mobility, difficulty walking, decreased ROM, decreased strength, increased fascial restrictions, impaired perceived functional ability, impaired flexibility, impaired sensation, improper body mechanics, and pain.  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 PMH includes bilateral Le edema, BLE weakness, constipation, chronic pain, GERD, tremor, HSP, migraine, neurogenic bladder,seizures, depression, migraine, COVID 19, chronic UTI, urostomy  are also affecting patient's functional outcome.   REHAB POTENTIAL: Good  CLINICAL DECISION MAKING: Evolving/moderate complexity  EVALUATION COMPLEXITY: Moderate   GOALS: Goals reviewed with patient? Yes  SHORT TERM GOALS: Target date: 03/20/2022  Patient will be independent in home exercise program to improve strength/mobility for better functional independence with ADLs. Baseline: 02/20/22; give next session Goal status: INITIAL    LONG TERM GOALS: Target date: 05/15/2022   Patient will increase FOTO score to equal to or greater than  49%   to demonstrate  statistically significant improvement in mobility and quality of life.  Baseline: 11/6: 34% Goal status: INITIAL  2.   Patient (< 22 years old) will complete five times sit to stand test in < 10 seconds indicating an increased LE strength and improved balance. Baseline: 11/6: perform next session Goal status: INITIAL  3.  Patient will increase 10 meter walk test to less than 30 seconds as to improve gait speed for better community ambulation and to reduce fall risk. Baseline: 11/6: perform next session Goal status: INITIAL  4.  Patient will increase BLE gross strength to 4+/5 as to improve functional strength for independent gait, increased standing tolerance and increased ADL ability. Baseline: 11/6: see above Goal status: INITIAL     PLAN:  PT FREQUENCY: 2x/week  PT DURATION: 12 weeks  PLANNED INTERVENTIONS: Therapeutic exercises, Therapeutic activity, Neuromuscular re-education, Balance training, Gait training, Patient/Family education, Joint mobilization, Stair training, Vestibular training, Canalith repositioning, DME instructions, Dry Needling, Cognitive remediation, Electrical stimulation, Wheelchair mobility training, Spinal mobilization, Cryotherapy, Moist heat, Taping, Vasopneumatic device, Traction, Ultrasound, Ionotophoresis 4mg /ml Dexamethasone, Manual therapy, and Re-evaluation  PLAN FOR NEXT SESSION: 5x STS, 10 MWT   Janna Arch, PT 02/20/2022, 12:36 PM

## 2022-02-20 ENCOUNTER — Ambulatory Visit: Payer: BC Managed Care – PPO

## 2022-02-20 ENCOUNTER — Ambulatory Visit: Payer: BC Managed Care – PPO | Attending: Orthopedic Surgery

## 2022-02-20 DIAGNOSIS — M25552 Pain in left hip: Secondary | ICD-10-CM | POA: Diagnosis present

## 2022-02-20 DIAGNOSIS — M25551 Pain in right hip: Secondary | ICD-10-CM | POA: Insufficient documentation

## 2022-02-20 DIAGNOSIS — R262 Difficulty in walking, not elsewhere classified: Secondary | ICD-10-CM | POA: Insufficient documentation

## 2022-02-20 DIAGNOSIS — M6281 Muscle weakness (generalized): Secondary | ICD-10-CM | POA: Diagnosis present

## 2022-02-21 NOTE — Therapy (Signed)
OUTPATIENT PHYSICAL THERAPY LOWER EXTREMITY TREATMENT   Patient Name: Christine Chavez MRN: [id number] DOB:03/05/1981, 41 y.o., female Today's Date: 02/22/2022   PT End of Session - 02/22/22 1058     Visit Number 2    Number of Visits 24    Date for PT Re-Evaluation 05/15/22    PT Start Time 1100    PT Stop Time 1144    PT Time Calculation (min) 44 min    Equipment Utilized During Treatment Gait belt    Activity Tolerance Patient tolerated treatment well;Patient limited by pain    Behavior During Therapy WFL for tasks assessed/performed             Past Medical History:  Diagnosis Date   Complication of anesthesia    ? seizures after anesthesia    Headache    Migraines    Neurogenic bladder    Renal disorder    Vision abnormalities    Past Surgical History:  Procedure Laterality Date   ANTERIOR CRUCIATE LIGAMENT REPAIR  1997   APPENDECTOMY     COLONOSCOPY WITH PROPOFOL N/A 11/11/2018   Procedure: COLONOSCOPY WITH PROPOFOL;  Surgeon: [name], MD;  Location: [facility] ENDOSCOPY;  Service: Endoscopy;  Laterality: N/A;   CYSTOSCOPY WITH STENT PLACEMENT Right 04/17/2016   Procedure: CYSTOSCOPY WITH STENT PLACEMENT;  Surgeon: [name], MD;  Location: [facility] ORS;  Service: Urology;  Laterality: Right;   ESOPHAGOGASTRODUODENOSCOPY (EGD) WITH PROPOFOL N/A 11/11/2018   Procedure: ESOPHAGOGASTRODUODENOSCOPY (EGD) WITH PROPOFOL;  Surgeon: [name], MD;  Location: [facility] ENDOSCOPY;  Service: Endoscopy;  Laterality: N/A;   EXPLORATORY LAPAROTOMY  1999   KIDNEY STONE SURGERY  04/2016   REVISION UROSTOMY CUTANEOUS     REVISION UROSTOMY CUTANEOUS  01/10/2018   SUPRAPUBIC CATHETER PLACEMENT  08/2017   TONSILLECTOMY     Patient Active Problem List   Diagnosis Date Noted   Seizure ([place]) 11/11/2018   Major depressive disorder, recurrent episode, moderate ([name]) 02/07/2018   Nephrolithiasis 04/16/2016   Numbness 07/28/2015   Bladder retention 06/23/2015    Abdominal pain 06/04/2015   Dizziness 05/18/2015   Neck pain 48/54/6270   Complicated migraine 35/00/9381   Other fatigue 04/28/2015   Abnormal finding on MRI of brain 04/28/2015   D (diarrhea) 03/29/2015   H/O disease 03/29/2015   Abnormal weight loss 03/29/2015   Muscle weakness (generalized) 03/14/2015   Headache, migraine 03/10/2015    PCP: Elza Rafter MD  REFERRING PROVIDER: Genia Del MD  REFERRING DIAG: s/p hip surgery, degenerative tear of acetabular labrum   THERAPY DIAG:  Pain in left hip  Muscle weakness (generalized)  Difficulty in walking, not elsewhere classified  Rationale for Evaluation and Treatment: Rehabilitation  ONSET DATE: 12/28/21  SUBJECTIVE:   SUBJECTIVE STATEMENT: Patient had a fall yesterday in the bathroom, had a strange angle to her surgical site; patient advised to call surgeon to inform him of fall.  Patient is now WBAT as she is 8 weeks from surgery.   PERTINENT HISTORY: Patient is returning to PT s/p L hip surgery 12/28/21.  Patient received HHPT afterwards. 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 Patient is going to get a whole scope of bladder 03/16/22. Patient has been seen prior to surgery by this author for her Hereditary Spastic Paraplegia (HSP). PMH includes bilateral Le edema, BLE weakness, constipation, chronic pain, GERD, tremor, HSP, migraine, neurogenic bladder,seizures, depression, migraine, COVID 19, chronic UTI, urostomy. Patient is  on protocol of 50% WB until week 8.  PAIN:  Are you having pain? Yes: NPRS scale: 7/10 Pain location: bladder Pain description: spasm Aggravating factors: n/a Relieving factors: n/a   PRECAUTIONS: Anterior hip and Fall  WEIGHT BEARING RESTRICTIONS: Yes WBAT  at 8 weeks, 50% WB today.   FALLS:  Has patient fallen in last 6 months? No  LIVING ENVIRONMENT: Lives with: lives with their family Lives in:  House/apartment Stairs: Yes: External: 0 steps; ramp Has following equipment at home: Walker - 4 wheeled and Wheelchair (manual)    PLOF: Independent with basic ADLs  PATIENT GOALS: to be able to ADLs independently.    OBJECTIVE:   DIAGNOSTIC FINDINGS: s/p surgery  PATIENT SURVEYS:  FOTO 34  COGNITION: Overall cognitive status: Within functional limits for tasks assessed     SENSATION: Light touch: Impaired L lateral thigh      POSTURE: No Significant postural limitations  PALPATION: Tender at post surgical site  LOWER EXTREMITY ROM:  Active ROM Right eval Left eval  Hip flexion PROM 70 AROM 35 PROM 65 AROM 5  Hip abduction PROM 25 AROM 23 PROM 35 AROM 4    (Blank rows = not tested)  LOWER EXTREMITY MMT:  MMT Right eval Left eval  Hip flexion 4/5 3+/5  Hip extension    Hip abduction 4-/5 3/5  Hip adduction 3+/5 3/5  Hip internal rotation    Hip external rotation    Knee flexion 4-/5 3+/5 Painful  Knee extension 4-/5 3/5  Ankle dorsiflexion 3+/5 3/5  Ankle plantarflexion 3+/5 3/5  Ankle inversion    Ankle eversion     (Blank rows = not tested)  FUNCTIONAL TESTS:  5 times sit to stand: 43.64  SPT: 50% WB on LLE; Min A for performance  Supine<>sit CGA with min A for LLE   GAIT: Will assess next session when WBAT   TODAY'S TREATMENT:                                                                                                                              DATE: 02/22/22 10 MWT: 20.2 ft in 2 minutes 21 seconds 5xSTS:  43.64   Patient dizzy s/p 10 MWT: laid supine to return to normal.   Supine: Heel slide 10x AAROM each LE Bridge 10x Hamstring PROM 60 seconds x 2 trials Single knee to chest 10x AAROM  Standing balance 30 seconds weight shifting  SPT requires min A and hand held assistance.   PATIENT EDUCATION:  Education details: goals, POC  Person educated: Patient Education method: Explanation Education comprehension: verbalized  understanding and returned demonstration  HOME EXERCISE PROGRAM: Give next session  ASSESSMENT:  CLINICAL IMPRESSION: Patient performed 5x STS with BUE support. She is unable to complete 10 MWT but does perform 20.2 ft with rollator prior to having to sit. Patient did have a fall yesterday and was educated on need to call surgeon due to positioning of  surgical limb during fall. Patient agreeable.  Patient will benefit from skilled physical therapy to increase functional mobility, strength, and quality of life.   OBJECTIVE IMPAIRMENTS: Abnormal gait, decreased activity tolerance, decreased balance, decreased mobility, difficulty walking, decreased ROM, decreased strength, increased fascial restrictions, impaired perceived functional ability, impaired flexibility, impaired sensation, improper body mechanics, and pain.   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 PMH includes bilateral Le edema, BLE weakness, constipation, chronic pain, GERD, tremor, HSP, migraine, neurogenic bladder,seizures, depression, migraine, COVID 19, chronic UTI, urostomy  are also affecting patient's functional outcome.   REHAB POTENTIAL: Good  CLINICAL DECISION MAKING: Evolving/moderate complexity  EVALUATION COMPLEXITY: Moderate   GOALS: Goals reviewed with patient? Yes  SHORT TERM GOALS: Target date: 03/20/2022  Patient will be independent in home exercise program to improve strength/mobility for better  functional independence with ADLs. Baseline: 02/20/22; give next session Goal status: INITIAL    LONG TERM GOALS: Target date: 05/15/2022   Patient will increase FOTO score to equal to or greater than  49%   to demonstrate statistically significant improvement in mobility and quality of life.  Baseline: 11/6: 34% Goal status: INITIAL  2.   Patient (< 20 years old) will complete five times sit to stand test in < 10 seconds indicating an increased LE strength and improved balance. Baseline: 11/6: perform next session 11/8: 43.64 with BUE support Goal status: INITIAL  3.  Patient will increase 10 meter walk test to less than 30 seconds as to improve gait speed for better community ambulation and to reduce fall risk. Baseline: 11/6: perform next session; 11/8: unable to perform full length: did 20.2 ft in 2 min 21 seconds with rollator Goal status: INITIAL  4.  Patient will increase BLE gross strength to 4+/5 as to improve functional strength for independent gait, increased standing tolerance and increased ADL ability. Baseline: 11/6: see above Goal status: INITIAL     PLAN:  PT FREQUENCY: 2x/week  PT DURATION: 12 weeks  PLANNED INTERVENTIONS: Therapeutic exercises, Therapeutic activity, Neuromuscular re-education, Balance training, Gait training, Patient/Family education, Joint mobilization, Stair training, Vestibular training, Canalith repositioning, DME instructions, Dry Needling, Cognitive remediation, Electrical stimulation, Wheelchair mobility training, Spinal mobilization, Cryotherapy, Moist heat, Taping, Vasopneumatic device, Traction, Ultrasound, Ionotophoresis 4mg /ml Dexamethasone, Manual therapy, and Re-evaluation  PLAN FOR NEXT SESSION: 5x STS, 10 MWT   , PT 02/22/2022, 12:47 PM

## 2022-02-22 ENCOUNTER — Ambulatory Visit: Payer: BC Managed Care – PPO

## 2022-02-22 DIAGNOSIS — M6281 Muscle weakness (generalized): Secondary | ICD-10-CM

## 2022-02-22 DIAGNOSIS — M25552 Pain in left hip: Secondary | ICD-10-CM

## 2022-02-22 DIAGNOSIS — R262 Difficulty in walking, not elsewhere classified: Secondary | ICD-10-CM

## 2022-02-23 ENCOUNTER — Encounter: Payer: BC Managed Care – PPO | Admitting: Physical Therapy

## 2022-02-23 NOTE — Therapy (Signed)
OUTPATIENT PHYSICAL THERAPY LOWER EXTREMITY TREATMENT   Patient Name: Christine Chavez MRN: [id number] DOB:1980-08-31, 41 y.o., female Today's Date: 02/27/2022   PT End of Session - 02/27/22 1240     Visit Number 3    Number of Visits 24    Date for PT Re-Evaluation 05/15/22    PT Start Time 1102    PT Stop Time 1146    PT Time Calculation (min) 44 min    Equipment Utilized During Treatment Gait belt    Activity Tolerance Patient tolerated treatment well;Patient limited by pain    Behavior During Therapy WFL for tasks assessed/performed             Past Medical History:  Diagnosis Date   Complication of anesthesia    ? seizures after anesthesia    Headache    Migraines    Neurogenic bladder    Renal disorder    Vision abnormalities    Past Surgical History:  Procedure Laterality Date   ANTERIOR CRUCIATE LIGAMENT REPAIR  1997   APPENDECTOMY     COLONOSCOPY WITH PROPOFOL N/A 11/11/2018   Procedure: COLONOSCOPY WITH PROPOFOL;  Surgeon: Christena Deem, MD;  Location: [facility] ENDOSCOPY;  Service: Endoscopy;  Laterality: N/A;   CYSTOSCOPY WITH STENT PLACEMENT Right 04/17/2016   Procedure: CYSTOSCOPY WITH STENT PLACEMENT;  Surgeon: Malen Gauze, MD;  Location: [facility] ORS;  Service: Urology;  Laterality: Right;   ESOPHAGOGASTRODUODENOSCOPY (EGD) WITH PROPOFOL N/A 11/11/2018   Procedure: ESOPHAGOGASTRODUODENOSCOPY (EGD) WITH PROPOFOL;  Surgeon: Christena Deem, MD;  Location: [facility] ENDOSCOPY;  Service: Endoscopy;  Laterality: N/A;   EXPLORATORY LAPAROTOMY  1999   KIDNEY STONE SURGERY  04/2016   REVISION UROSTOMY CUTANEOUS     REVISION UROSTOMY CUTANEOUS  01/10/2018   SUPRAPUBIC CATHETER PLACEMENT  08/2017   TONSILLECTOMY     Patient Active Problem List   Diagnosis Date Noted   Seizure (HCC) 11/11/2018   Major depressive disorder, recurrent episode, moderate (HCC) 02/07/2018   Nephrolithiasis 04/16/2016   Numbness 07/28/2015   Bladder retention 06/23/2015    Abdominal pain 06/04/2015   Dizziness 05/18/2015   Neck pain 05/18/2015   Complicated migraine 04/28/2015   Other fatigue 04/28/2015   Abnormal finding on MRI of brain 04/28/2015   D (diarrhea) 03/29/2015   H/O disease 03/29/2015   Abnormal weight loss 03/29/2015   Muscle weakness (generalized) 03/14/2015   Headache, migraine 03/10/2015    PCP: Ermalinda Memos MD  REFERRING PROVIDER: Lacy Duverney MD  REFERRING DIAG: s/p hip surgery, degenerative tear of acetabular labrum   THERAPY DIAG:  Pain in left hip  Muscle weakness (generalized)  Difficulty in walking, not elsewhere classified  Pain in right hip  Rationale for Evaluation and Treatment: Rehabilitation  ONSET DATE: 12/28/21  SUBJECTIVE:   SUBJECTIVE STATEMENT: Patient reports the physician has not called her back yet about her fall.   PERTINENT HISTORY: Patient is returning to PT s/p L hip surgery 12/28/21.  Patient received HHPT afterwards. 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 Patient is going to get a whole scope of bladder 03/16/22. Patient has been seen prior to surgery by this author for her Hereditary Spastic Paraplegia (HSP). PMH includes bilateral Le edema, BLE weakness, constipation, chronic pain, GERD, tremor, HSP, migraine, neurogenic bladder,seizures, depression, migraine, COVID 19, chronic UTI, urostomy. Patient is on protocol of 50% WB until week 8.  PAIN:  Are you having pain? Yes: NPRS scale: 7/10 Pain  location: bladder Pain description: spasm Aggravating factors: n/a Relieving factors: n/a   PRECAUTIONS: Anterior hip and Fall  WEIGHT BEARING RESTRICTIONS: Yes WBAT  at 8 weeks, 50% WB today.   FALLS:  Has patient fallen in last 6 months? No  LIVING ENVIRONMENT: Lives with: lives with their family Lives in: House/apartment Stairs: Yes: External: 0 steps; ramp Has following equipment at home: Walker - 4  wheeled and Wheelchair (manual)    PLOF: Independent with basic ADLs  PATIENT GOALS: to be able to ADLs independently.    OBJECTIVE:   DIAGNOSTIC FINDINGS: s/p surgery  PATIENT SURVEYS:  FOTO 34  COGNITION: Overall cognitive status: Within functional limits for tasks assessed     SENSATION: Light touch: Impaired L lateral thigh      POSTURE: No Significant postural limitations  PALPATION: Tender at post surgical site  LOWER EXTREMITY ROM:  Active ROM Right eval Left eval  Hip flexion PROM 70 AROM 35 PROM 65 AROM 5  Hip abduction PROM 25 AROM 23 PROM 35 AROM 4    (Blank rows = not tested)  LOWER EXTREMITY MMT:  MMT Right eval Left eval  Hip flexion 4/5 3+/5  Hip extension    Hip abduction 4-/5 3/5  Hip adduction 3+/5 3/5  Hip internal rotation    Hip external rotation    Knee flexion 4-/5 3+/5 Painful  Knee extension 4-/5 3/5  Ankle dorsiflexion 3+/5 3/5  Ankle plantarflexion 3+/5 3/5  Ankle inversion    Ankle eversion     (Blank rows = not tested)  FUNCTIONAL TESTS:  5 times sit to stand: 43.64  SPT: 50% WB on LLE; Min A for performance  Supine<>sit CGA with min A for LLE   GAIT: Will assess next session when WBAT   TODAY'S TREATMENT:                                                                                                                              DATE: 02/27/22 Manual: Hamstring lengthening stretch 60 seconds each LE Single knee to chest 60 seconds each LE Figure four modified 60 seconds each LE  TherEx Supine: RTB abduction 15x Small ball adduction squeeze 10x Small ball adduction with posterior pelvic tilt 10x Posterior pelvic tilt 10x Modified march 10x each LE  Seated: LAQ 10x each LE ER/ IR 10x each LE Sit to stand 10x with assistance  TrA activation 10x Adduction ball squeeze 10x Adduction ball squeeze 10x with df/pf  Standing:  Lateral stepping 8x length of bed with one seated rest break Static stand 60  seconds Stand and march 10x each LE   PATIENT EDUCATION:  Education details: goals, POC  Person educated: Patient Education method: Explanation Education comprehension: verbalized understanding and returned demonstration  HOME EXERCISE PROGRAM: Give next session  ASSESSMENT:  CLINICAL IMPRESSION: Patient tolerates functional strengthening interventions with increased trembling and instability with prolonged standing. Patient has increased muscle tremor with intentional movement. Core stabilization  interventions added to POC due to back pain.   Patient will benefit from skilled physical therapy to increase functional mobility, strength, and quality of life.   OBJECTIVE IMPAIRMENTS: Abnormal gait, decreased activity tolerance, decreased balance, decreased mobility, difficulty walking, decreased ROM, decreased strength, increased fascial restrictions, impaired perceived functional ability, impaired flexibility, impaired sensation, improper body mechanics, and pain.   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 PMH includes bilateral Le edema, BLE weakness, constipation, chronic pain, GERD, tremor, HSP, migraine, neurogenic bladder,seizures, depression, migraine, COVID 19, chronic UTI, urostomy  are also affecting patient's functional outcome.   REHAB POTENTIAL: Good  CLINICAL DECISION MAKING: Evolving/moderate complexity  EVALUATION COMPLEXITY: Moderate   GOALS: Goals reviewed with  patient? Yes  SHORT TERM GOALS: Target date: 03/20/2022  Patient will be independent in home exercise program to improve strength/mobility for better functional independence with ADLs. Baseline: 02/20/22; give next session Goal status: INITIAL    LONG TERM GOALS: Target date: 05/15/2022   Patient will increase FOTO score to equal to or greater than  49%   to demonstrate statistically significant improvement in mobility and quality of life.  Baseline: 11/6: 34% Goal status: INITIAL  2.   Patient (< 105 years old) will complete five times sit to stand test in < 10 seconds indicating an increased LE strength and improved balance. Baseline: 11/6: perform next session 11/8: 43.64 with BUE support Goal status: INITIAL  3.  Patient will increase 10 meter walk test to less than 30 seconds as to improve gait speed for better community ambulation and to reduce fall risk. Baseline: 11/6: perform next session; 11/8: unable to perform full length: did 20.2 ft in 2 min 21 seconds with rollator Goal status: INITIAL  4.  Patient will increase BLE gross strength to 4+/5 as to improve functional strength for independent gait, increased standing tolerance and increased ADL ability. Baseline: 11/6: see above Goal status: INITIAL     PLAN:  PT FREQUENCY: 2x/week  PT DURATION: 12 weeks  PLANNED INTERVENTIONS: Therapeutic exercises, Therapeutic activity, Neuromuscular re-education, Balance training, Gait training, Patient/Family education, Joint mobilization, Stair training, Vestibular training, Canalith repositioning, DME instructions, Dry Needling, Cognitive remediation, Electrical stimulation, Wheelchair mobility training, Spinal mobilization, Cryotherapy, Moist heat, Taping, Vasopneumatic device, Traction, Ultrasound, Ionotophoresis 4mg /ml Dexamethasone, Manual therapy, and Re-evaluation  PLAN FOR NEXT SESSION: 5x STS, 10 MWT   , PT 02/27/2022, 12:46 PM

## 2022-02-27 ENCOUNTER — Ambulatory Visit: Payer: BC Managed Care – PPO

## 2022-02-27 DIAGNOSIS — M25551 Pain in right hip: Secondary | ICD-10-CM

## 2022-02-27 DIAGNOSIS — M25552 Pain in left hip: Secondary | ICD-10-CM | POA: Diagnosis not present

## 2022-02-27 DIAGNOSIS — R262 Difficulty in walking, not elsewhere classified: Secondary | ICD-10-CM

## 2022-02-27 DIAGNOSIS — M6281 Muscle weakness (generalized): Secondary | ICD-10-CM

## 2022-03-01 ENCOUNTER — Ambulatory Visit: Payer: BC Managed Care – PPO

## 2022-03-02 ENCOUNTER — Encounter: Payer: BC Managed Care – PPO | Admitting: Physical Therapy

## 2022-03-06 ENCOUNTER — Ambulatory Visit: Payer: BC Managed Care – PPO

## 2022-03-06 DIAGNOSIS — R262 Difficulty in walking, not elsewhere classified: Secondary | ICD-10-CM

## 2022-03-06 DIAGNOSIS — M6281 Muscle weakness (generalized): Secondary | ICD-10-CM

## 2022-03-06 DIAGNOSIS — M25551 Pain in right hip: Secondary | ICD-10-CM

## 2022-03-06 DIAGNOSIS — M25552 Pain in left hip: Secondary | ICD-10-CM

## 2022-03-06 NOTE — Therapy (Signed)
OUTPATIENT PHYSICAL THERAPY LOWER EXTREMITY TREATMENT   Patient Name: Christine Chavez MRN: [id number] DOB:1981-01-17, 41 y.o., female Today's Date: 03/06/2022   PT End of Session - 03/06/22 1050     Visit Number 4    Number of Visits 24    Date for PT Re-Evaluation 05/15/22    PT Start Time 1055    PT Stop Time 1144    PT Time Calculation (min) 49 min    Equipment Utilized During Treatment Gait belt    Activity Tolerance Patient tolerated treatment well;Patient limited by pain    Behavior During Therapy WFL for tasks assessed/performed              Past Medical History:  Diagnosis Date   Complication of anesthesia    ? seizures after anesthesia    Headache    Migraines    Neurogenic bladder    Renal disorder    Vision abnormalities    Past Surgical History:  Procedure Laterality Date   ANTERIOR CRUCIATE LIGAMENT REPAIR  1997   APPENDECTOMY     COLONOSCOPY WITH PROPOFOL N/A 11/11/2018   Procedure: COLONOSCOPY WITH PROPOFOL;  Surgeon: Christena Deem, MD;  Location: [facility] ENDOSCOPY;  Service: Endoscopy;  Laterality: N/A;   CYSTOSCOPY WITH STENT PLACEMENT Right 04/17/2016   Procedure: CYSTOSCOPY WITH STENT PLACEMENT;  Surgeon: Malen Gauze, MD;  Location: [facility] ORS;  Service: Urology;  Laterality: Right;   ESOPHAGOGASTRODUODENOSCOPY (EGD) WITH PROPOFOL N/A 11/11/2018   Procedure: ESOPHAGOGASTRODUODENOSCOPY (EGD) WITH PROPOFOL;  Surgeon: Christena Deem, MD;  Location: [facility] ENDOSCOPY;  Service: Endoscopy;  Laterality: N/A;   EXPLORATORY LAPAROTOMY  1999   KIDNEY STONE SURGERY  04/2016   REVISION UROSTOMY CUTANEOUS     REVISION UROSTOMY CUTANEOUS  01/10/2018   SUPRAPUBIC CATHETER PLACEMENT  08/2017   TONSILLECTOMY     Patient Active Problem List   Diagnosis Date Noted   Seizure (HCC) 11/11/2018   Major depressive disorder, recurrent episode, moderate (HCC) 02/07/2018   Nephrolithiasis 04/16/2016   Numbness 07/28/2015   Bladder retention 06/23/2015    Abdominal pain 06/04/2015   Dizziness 05/18/2015   Neck pain 05/18/2015   Complicated migraine 04/28/2015   Other fatigue 04/28/2015   Abnormal finding on MRI of brain 04/28/2015   D (diarrhea) 03/29/2015   H/O disease 03/29/2015   Abnormal weight loss 03/29/2015   Muscle weakness (generalized) 03/14/2015   Headache, migraine 03/10/2015    PCP: Ermalinda Memos MD  REFERRING PROVIDER: Lacy Duverney MD  REFERRING DIAG: s/p hip surgery, degenerative tear of acetabular labrum   THERAPY DIAG:  Pain in left hip  Muscle weakness (generalized)  Difficulty in walking, not elsewhere classified  Pain in right hip  Rationale for Evaluation and Treatment: Rehabilitation  ONSET DATE: 12/28/21  SUBJECTIVE:   SUBJECTIVE STATEMENT: Patient had severe bladder spasms and migraines over the weekend.   PERTINENT HISTORY: Patient is returning to PT s/p L hip surgery 12/28/21.  Patient received HHPT afterwards. 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 Patient is going to get a whole scope of bladder 03/16/22. Patient has been seen prior to surgery by this author for her Hereditary Spastic Paraplegia (HSP). PMH includes bilateral Le edema, BLE weakness, constipation, chronic pain, GERD, tremor, HSP, migraine, neurogenic bladder,seizures, depression, migraine, COVID 19, chronic UTI, urostomy. Patient is on protocol of 50% WB until week 8.  PAIN:  Are you having pain? Yes: NPRS scale: 7/10 Pain location: bladder  Pain description: spasm Aggravating factors: n/a Relieving factors: n/a   PRECAUTIONS: Anterior hip and Fall  WEIGHT BEARING RESTRICTIONS: Yes WBAT  at 8 weeks, 50% WB today.   FALLS:  Has patient fallen in last 6 months? No  LIVING ENVIRONMENT: Lives with: lives with their family Lives in: House/apartment Stairs: Yes: External: 0 steps; ramp Has following equipment at home: Walker - 4 wheeled and  Wheelchair (manual)    PLOF: Independent with basic ADLs  PATIENT GOALS: to be able to ADLs independently.    OBJECTIVE:   DIAGNOSTIC FINDINGS: s/p surgery  PATIENT SURVEYS:  FOTO 34  COGNITION: Overall cognitive status: Within functional limits for tasks assessed     SENSATION: Light touch: Impaired L lateral thigh      POSTURE: No Significant postural limitations  PALPATION: Tender at post surgical site  LOWER EXTREMITY ROM:  Active ROM Right eval Left eval  Hip flexion PROM 70 AROM 35 PROM 65 AROM 5  Hip abduction PROM 25 AROM 23 PROM 35 AROM 4    (Blank rows = not tested)  LOWER EXTREMITY MMT:  MMT Right eval Left eval  Hip flexion 4/5 3+/5  Hip extension    Hip abduction 4-/5 3/5  Hip adduction 3+/5 3/5  Hip internal rotation    Hip external rotation    Knee flexion 4-/5 3+/5 Painful  Knee extension 4-/5 3/5  Ankle dorsiflexion 3+/5 3/5  Ankle plantarflexion 3+/5 3/5  Ankle inversion    Ankle eversion     (Blank rows = not tested)  FUNCTIONAL TESTS:  5 times sit to stand: 43.64  SPT: 50% WB on LLE; Min A for performance  Supine<>sit CGA with min A for LLE   GAIT: Will assess next session when WBAT   TODAY'S TREATMENT:                                                                                                                              DATE: 03/06/22 Manual: Hamstring lengthening stretch 60 seconds each LE Single knee to chest 60 seconds each LE Figure four modified 60 seconds each LE Prone roller x8 minutes to low back and gluteal musculature Grade II mobilizations UPA/CPA lumbar spine x 4 minutes   TherEx Supine: RTB abduction 15x SAQ over bolster 15x each LE x 2 sets  Posterior pelvic tilt 10x Modified march with RTB10x each LE   Standing:  Lateral weight shifting x10 each LE Static stand 60 seconds Stand and march 10x each LE  Walk 30 ft with rolling upright walker. Very unstable with multiple episodes of knee  buckling.   PATIENT EDUCATION:  Education details: goals, POC  Person educated: Patient Education method: Explanation Education comprehension: verbalized understanding and returned demonstration  HOME EXERCISE PROGRAM: Give next session  ASSESSMENT:  CLINICAL IMPRESSION: Patient requires additional rest breaks due to bladder spasms and migraine. She is highly motivated despite increased pain and is able to tolerate ambulation this session.  She is introduced to upright walker and is able to turn and sit without assistance.  She is fatigued but with decreased back pain by end of session, no increase in hip pain. Patient will benefit from skilled physical therapy to increase functional mobility, strength, and quality of life.   OBJECTIVE IMPAIRMENTS: Abnormal gait, decreased activity tolerance, decreased balance, decreased mobility, difficulty walking, decreased ROM, decreased strength, increased fascial restrictions, impaired perceived functional ability, impaired flexibility, impaired sensation, improper body mechanics, and pain.   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 PMH includes bilateral Le edema, BLE weakness, constipation, chronic pain, GERD, tremor, HSP, migraine, neurogenic bladder,seizures, depression, migraine, COVID 19, chronic UTI, urostomy  are also affecting patient's functional outcome.   REHAB POTENTIAL: Good  CLINICAL DECISION MAKING: Evolving/moderate  complexity  EVALUATION COMPLEXITY: Moderate   GOALS: Goals reviewed with patient? Yes  SHORT TERM GOALS: Target date: 03/20/2022  Patient will be independent in home exercise program to improve strength/mobility for better functional independence with ADLs. Baseline: 02/20/22; give next session Goal status: INITIAL    LONG TERM GOALS: Target date: 05/15/2022   Patient will increase FOTO score to equal to or greater than  49%   to demonstrate statistically significant improvement in mobility and quality of life.  Baseline: 11/6: 34% Goal status: INITIAL  2.   Patient (< 29 years old) will complete five times sit to stand test in < 10 seconds indicating an increased LE strength and improved balance. Baseline: 11/6: perform next session 11/8: 43.64 with BUE support Goal status: INITIAL  3.  Patient will increase 10 meter walk test to less than 30 seconds as to improve gait speed for better community ambulation and to reduce fall risk. Baseline: 11/6: perform next session; 11/8: unable to perform full length: did 20.2 ft in 2 min 21 seconds with rollator Goal status: INITIAL  4.  Patient will increase BLE gross strength to 4+/5 as to improve functional strength for independent gait, increased standing tolerance and increased ADL ability. Baseline: 11/6: see above Goal status: INITIAL     PLAN:  PT FREQUENCY: 2x/week  PT DURATION: 12 weeks  PLANNED INTERVENTIONS: Therapeutic exercises, Therapeutic activity, Neuromuscular re-education, Balance training, Gait training, Patient/Family education, Joint mobilization, Stair training, Vestibular training, Canalith repositioning, DME instructions, Dry Needling, Cognitive remediation, Electrical stimulation, Wheelchair mobility training, Spinal mobilization, Cryotherapy, Moist heat, Taping, Vasopneumatic device, Traction, Ultrasound, Ionotophoresis 4mg /ml Dexamethasone, Manual therapy, and Re-evaluation  PLAN FOR NEXT SESSION: 5x STS, 10  MWT   , PT 03/06/2022, 12:40 PM

## 2022-03-07 NOTE — Therapy (Incomplete)
OUTPATIENT PHYSICAL THERAPY LOWER EXTREMITY TREATMENT   Patient Name: Christine Chavez MRN: [id number] DOB:[birth year], 41 y.o., female Today's Date: 03/07/2022      Past Medical History:  Diagnosis Date   Complication of anesthesia    ? seizures after anesthesia    Headache    Migraines    Neurogenic bladder    Renal disorder    Vision abnormalities    Past Surgical History:  Procedure Laterality Date   ANTERIOR CRUCIATE LIGAMENT REPAIR  1997   APPENDECTOMY     COLONOSCOPY WITH PROPOFOL N/A 11/11/2018   Procedure: COLONOSCOPY WITH PROPOFOL;  Surgeon: Christena Deem, MD;  Location: [facility] ENDOSCOPY;  Service: Endoscopy;  Laterality: N/A;   CYSTOSCOPY WITH STENT PLACEMENT Right 04/17/2016   Procedure: CYSTOSCOPY WITH STENT PLACEMENT;  Surgeon: Malen Gauze, MD;  Location: [facility] ORS;  Service: Urology;  Laterality: Right;   ESOPHAGOGASTRODUODENOSCOPY (EGD) WITH PROPOFOL N/A 11/11/2018   Procedure: ESOPHAGOGASTRODUODENOSCOPY (EGD) WITH PROPOFOL;  Surgeon: Christena Deem, MD;  Location: [facility] ENDOSCOPY;  Service: Endoscopy;  Laterality: N/A;   EXPLORATORY LAPAROTOMY  1999   KIDNEY STONE SURGERY  04/2016   REVISION UROSTOMY CUTANEOUS     REVISION UROSTOMY CUTANEOUS  01/10/2018   SUPRAPUBIC CATHETER PLACEMENT  08/2017   TONSILLECTOMY     Patient Active Problem List   Diagnosis Date Noted   Seizure (HCC) 11/11/2018   Major depressive disorder, recurrent episode, moderate (HCC) 02/07/2018   Nephrolithiasis 04/16/2016   Numbness 07/28/2015   Bladder retention 06/23/2015   Abdominal pain 06/04/2015   Dizziness 05/18/2015   Neck pain 05/18/2015   Complicated migraine 04/28/2015   Other fatigue 04/28/2015   Abnormal finding on MRI of brain 04/28/2015   D (diarrhea) 03/29/2015   H/O disease 03/29/2015   Abnormal weight loss 03/29/2015   Muscle weakness (generalized) 03/14/2015   Headache, migraine 03/10/2015    PCP: Ermalinda Memos MD  REFERRING PROVIDER:  Lacy Duverney MD  REFERRING DIAG: s/p hip surgery, degenerative tear of acetabular labrum   THERAPY DIAG:  No diagnosis found.  Rationale for Evaluation and Treatment: Rehabilitation  ONSET DATE: 12/28/21  SUBJECTIVE:   SUBJECTIVE STATEMENT: ***  PERTINENT HISTORY: Patient is returning to PT s/p L hip surgery 12/28/21.  Patient received HHPT afterwards. 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 Patient is going to get a whole scope of bladder 03/16/22. Patient has been seen prior to surgery by this author for her Hereditary Spastic Paraplegia (HSP). PMH includes bilateral Le edema, BLE weakness, constipation, chronic pain, GERD, tremor, HSP, migraine, neurogenic bladder,seizures, depression, migraine, COVID 19, chronic UTI, urostomy. Patient is on protocol of 50% WB until week 8.  PAIN:  Are you having pain? Yes: NPRS scale: 7/10 Pain location: bladder Pain description: spasm Aggravating factors: n/a Relieving factors: n/a   PRECAUTIONS: Anterior hip and Fall  WEIGHT BEARING RESTRICTIONS: Yes WBAT  at 8 weeks, 50% WB today.   FALLS:  Has patient fallen in last 6 months? No  LIVING ENVIRONMENT: Lives with: lives with their family Lives in: House/apartment Stairs: Yes: External: 0 steps; ramp Has following equipment at home: Walker - 4 wheeled and Wheelchair (manual)    PLOF: Independent with basic ADLs  PATIENT GOALS: to be able to ADLs independently.    OBJECTIVE:   DIAGNOSTIC FINDINGS: s/p surgery  PATIENT SURVEYS:  FOTO 34  COGNITION: Overall cognitive status: Within functional limits for tasks assessed     SENSATION:  Light touch: Impaired L lateral thigh      POSTURE: No Significant postural limitations  PALPATION: Tender at post surgical site  LOWER EXTREMITY ROM:  Active ROM Right eval Left eval  Hip flexion PROM 70 AROM 35 PROM 65 AROM 5  Hip abduction PROM 25 AROM  23 PROM 35 AROM 4    (Blank rows = not tested)  LOWER EXTREMITY MMT:  MMT Right eval Left eval  Hip flexion 4/5 3+/5  Hip extension    Hip abduction 4-/5 3/5  Hip adduction 3+/5 3/5  Hip internal rotation    Hip external rotation    Knee flexion 4-/5 3+/5 Painful  Knee extension 4-/5 3/5  Ankle dorsiflexion 3+/5 3/5  Ankle plantarflexion 3+/5 3/5  Ankle inversion    Ankle eversion     (Blank rows = not tested)  FUNCTIONAL TESTS:  5 times sit to stand: 43.64  SPT: 50% WB on LLE; Min A for performance  Supine<>sit CGA with min A for LLE   GAIT: Will assess next session when WBAT   TODAY'S TREATMENT:                                                                                                                              DATE: 03/07/22 Manual: Hamstring lengthening stretch 60 seconds each LE Single knee to chest 60 seconds each LE Figure four modified 60 seconds each LE Prone roller x8 minutes to low back and gluteal musculature Grade II mobilizations UPA/CPA lumbar spine x 4 minutes   TherEx Supine: RTB abduction 15x SAQ over bolster 15x each LE x 2 sets  Posterior pelvic tilt 10x Modified march with RTB10x each LE   Standing:  Lateral weight shifting x10 each LE Static stand 60 seconds Stand and march 10x each LE  Walk 30 ft with rolling upright walker. Very unstable with multiple episodes of knee buckling.   PATIENT EDUCATION:  Education details: goals, POC  Person educated: Patient Education method: Explanation Education comprehension: verbalized understanding and returned demonstration  HOME EXERCISE PROGRAM: Give next session  ASSESSMENT:  CLINICAL IMPRESSION: *** Patient will benefit from skilled physical therapy to increase functional mobility, strength, and quality of life.   OBJECTIVE IMPAIRMENTS: Abnormal gait, decreased activity tolerance, decreased balance, decreased mobility, difficulty walking, decreased ROM, decreased strength,  increased fascial restrictions, impaired perceived functional ability, impaired flexibility, impaired sensation, improper body mechanics, and pain.   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 PMH includes bilateral Le edema, BLE weakness, constipation, chronic pain, GERD, tremor, HSP, migraine, neurogenic bladder,seizures, depression, migraine, COVID 19, chronic UTI, urostomy  are also affecting patient's functional  outcome.   REHAB POTENTIAL: Good  CLINICAL DECISION MAKING: Evolving/moderate complexity  EVALUATION COMPLEXITY: Moderate   GOALS: Goals reviewed with patient? Yes  SHORT TERM GOALS: Target date: 03/20/2022  Patient will be independent in home exercise program to improve strength/mobility for better functional independence with ADLs. Baseline: 02/20/22; give next session Goal status: INITIAL    LONG TERM GOALS: Target date: 05/15/2022   Patient will increase FOTO score to equal to or greater than  49%   to demonstrate statistically significant improvement in mobility and quality of life.  Baseline: 11/6: 34% Goal status: INITIAL  2.   Patient (< 28 years old) will complete five times sit to stand test in < 10 seconds indicating an increased LE strength and improved balance. Baseline: 11/6: perform next session 11/8: 43.64 with BUE support Goal status: INITIAL  3.  Patient will increase 10 meter walk test to less than 30 seconds as to improve gait speed for better  community ambulation and to reduce fall risk. Baseline: 11/6: perform next session; 11/8: unable to perform full length: did 20.2 ft in 2 min 21 seconds with rollator Goal status: INITIAL  4.  Patient will increase BLE gross strength to 4+/5 as to improve functional strength for independent gait, increased standing tolerance and increased ADL ability. Baseline: 11/6: see above Goal status: INITIAL     PLAN:  PT FREQUENCY: 2x/week  PT DURATION: 12 weeks  PLANNED INTERVENTIONS: Therapeutic exercises, Therapeutic activity, Neuromuscular re-education, Balance training, Gait training, Patient/Family education, Joint mobilization, Stair training, Vestibular training, Canalith repositioning, DME instructions, Dry Needling, Cognitive remediation, Electrical stimulation, Wheelchair mobility training, Spinal mobilization, Cryotherapy, Moist heat, Taping, Vasopneumatic device, Traction, Ultrasound, Ionotophoresis 4mg /ml Dexamethasone, Manual therapy, and Re-evaluation  PLAN FOR NEXT SESSION: 5x STS, 10 MWT   , PT 03/07/2022, 1:00 PM

## 2022-03-08 ENCOUNTER — Ambulatory Visit: Payer: BC Managed Care – PPO

## 2022-03-08 NOTE — Therapy (Incomplete)
OUTPATIENT PHYSICAL THERAPY LOWER EXTREMITY TREATMENT   Patient Name: Christine Chavez MRN: [id number] DOB:18-Sep-1980, 41 y.o., female Today's Date: 03/08/2022      Past Medical History:  Diagnosis Date   Complication of anesthesia    ? seizures after anesthesia    Headache    Migraines    Neurogenic bladder    Renal disorder    Vision abnormalities    Past Surgical History:  Procedure Laterality Date   ANTERIOR CRUCIATE LIGAMENT REPAIR  1997   APPENDECTOMY     COLONOSCOPY WITH PROPOFOL N/A 11/11/2018   Procedure: COLONOSCOPY WITH PROPOFOL;  Surgeon: Christena Deem, MD;  Location: [facility] ENDOSCOPY;  Service: Endoscopy;  Laterality: N/A;   CYSTOSCOPY WITH STENT PLACEMENT Right 04/17/2016   Procedure: CYSTOSCOPY WITH STENT PLACEMENT;  Surgeon: Malen Gauze, MD;  Location: [facility] ORS;  Service: Urology;  Laterality: Right;   ESOPHAGOGASTRODUODENOSCOPY (EGD) WITH PROPOFOL N/A 11/11/2018   Procedure: ESOPHAGOGASTRODUODENOSCOPY (EGD) WITH PROPOFOL;  Surgeon: Christena Deem, MD;  Location: [facility] ENDOSCOPY;  Service: Endoscopy;  Laterality: N/A;   EXPLORATORY LAPAROTOMY  1999   KIDNEY STONE SURGERY  04/2016   REVISION UROSTOMY CUTANEOUS     REVISION UROSTOMY CUTANEOUS  01/10/2018   SUPRAPUBIC CATHETER PLACEMENT  08/2017   TONSILLECTOMY     Patient Active Problem List   Diagnosis Date Noted   Seizure (HCC) 11/11/2018   Major depressive disorder, recurrent episode, moderate (HCC) 02/07/2018   Nephrolithiasis 04/16/2016   Numbness 07/28/2015   Bladder retention 06/23/2015   Abdominal pain 06/04/2015   Dizziness 05/18/2015   Neck pain 05/18/2015   Complicated migraine 04/28/2015   Other fatigue 04/28/2015   Abnormal finding on MRI of brain 04/28/2015   D (diarrhea) 03/29/2015   H/O disease 03/29/2015   Abnormal weight loss 03/29/2015   Muscle weakness (generalized) 03/14/2015   Headache, migraine 03/10/2015    PCP: Ermalinda Memos MD  REFERRING PROVIDER:  Lacy Duverney MD  REFERRING DIAG: s/p hip surgery, degenerative tear of acetabular labrum   THERAPY DIAG:  No diagnosis found.  Rationale for Evaluation and Treatment: Rehabilitation  ONSET DATE: 12/28/21  SUBJECTIVE:   SUBJECTIVE STATEMENT: ***  PERTINENT HISTORY: Patient is returning to PT s/p L hip surgery 12/28/21.  Patient received HHPT afterwards. 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 Patient is going to get a whole scope of bladder 03/16/22. Patient has been seen prior to surgery by this author for her Hereditary Spastic Paraplegia (HSP). PMH includes bilateral Le edema, BLE weakness, constipation, chronic pain, GERD, tremor, HSP, migraine, neurogenic bladder,seizures, depression, migraine, COVID 19, chronic UTI, urostomy. Patient is on protocol of 50% WB until week 8.  PAIN:  Are you having pain? Yes: NPRS scale: 7/10 Pain location: bladder Pain description: spasm Aggravating factors: n/a Relieving factors: n/a   PRECAUTIONS: Anterior hip and Fall  WEIGHT BEARING RESTRICTIONS: Yes WBAT  at 8 weeks, 50% WB today.   FALLS:  Has patient fallen in last 6 months? No  LIVING ENVIRONMENT: Lives with: lives with their family Lives in: House/apartment Stairs: Yes: External: 0 steps; ramp Has following equipment at home: Walker - 4 wheeled and Wheelchair (manual)    PLOF: Independent with basic ADLs  PATIENT GOALS: to be able to ADLs independently.    OBJECTIVE:   DIAGNOSTIC FINDINGS: s/p surgery  PATIENT SURVEYS:  FOTO 34  COGNITION: Overall cognitive status: Within functional limits for tasks assessed     SENSATION:  Light touch: Impaired L lateral thigh      POSTURE: No Significant postural limitations  PALPATION: Tender at post surgical site  LOWER EXTREMITY ROM:  Active ROM Right eval Left eval  Hip flexion PROM 70 AROM 35 PROM 65 AROM 5  Hip abduction PROM 25 AROM  23 PROM 35 AROM 4    (Blank rows = not tested)  LOWER EXTREMITY MMT:  MMT Right eval Left eval  Hip flexion 4/5 3+/5  Hip extension    Hip abduction 4-/5 3/5  Hip adduction 3+/5 3/5  Hip internal rotation    Hip external rotation    Knee flexion 4-/5 3+/5 Painful  Knee extension 4-/5 3/5  Ankle dorsiflexion 3+/5 3/5  Ankle plantarflexion 3+/5 3/5  Ankle inversion    Ankle eversion     (Blank rows = not tested)  FUNCTIONAL TESTS:  5 times sit to stand: 43.64  SPT: 50% WB on LLE; Min A for performance  Supine<>sit CGA with min A for LLE   GAIT: Will assess next session when WBAT   TODAY'S TREATMENT:                                                                                                                              DATE: 03/08/22 Manual: Hamstring lengthening stretch 60 seconds each LE Single knee to chest 60 seconds each LE Figure four modified 60 seconds each LE Prone roller x8 minutes to low back and gluteal musculature Grade II mobilizations UPA/CPA lumbar spine x 4 minutes   TherEx Supine: RTB abduction 15x SAQ over bolster 15x each LE x 2 sets  Posterior pelvic tilt 10x Modified march with RTB10x each LE   Standing:  Lateral weight shifting x10 each LE Static stand 60 seconds Stand and march 10x each LE  Walk 30 ft with rolling upright walker. Very unstable with multiple episodes of knee buckling.   PATIENT EDUCATION:  Education details: goals, POC  Person educated: Patient Education method: Explanation Education comprehension: verbalized understanding and returned demonstration  HOME EXERCISE PROGRAM: Give next session  ASSESSMENT:  CLINICAL IMPRESSION: *** Patient will benefit from skilled physical therapy to increase functional mobility, strength, and quality of life.   OBJECTIVE IMPAIRMENTS: Abnormal gait, decreased activity tolerance, decreased balance, decreased mobility, difficulty walking, decreased ROM, decreased strength,  increased fascial restrictions, impaired perceived functional ability, impaired flexibility, impaired sensation, improper body mechanics, and pain.   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 PMH includes bilateral Le edema, BLE weakness, constipation, chronic pain, GERD, tremor, HSP, migraine, neurogenic bladder,seizures, depression, migraine, COVID 19, chronic UTI, urostomy  are also affecting patient's functional  outcome.   REHAB POTENTIAL: Good  CLINICAL DECISION MAKING: Evolving/moderate complexity  EVALUATION COMPLEXITY: Moderate   GOALS: Goals reviewed with patient? Yes  SHORT TERM GOALS: Target date: 03/20/2022  Patient will be independent in home exercise program to improve strength/mobility for better functional independence with ADLs. Baseline: 02/20/22; give next session Goal status: INITIAL    LONG TERM GOALS: Target date: 05/15/2022   Patient will increase FOTO score to equal to or greater than  49%   to demonstrate statistically significant improvement in mobility and quality of life.  Baseline: 11/6: 34% Goal status: INITIAL  2.   Patient (< 57 years old) will complete five times sit to stand test in < 10 seconds indicating an increased LE strength and improved balance. Baseline: 11/6: perform next session 11/8: 43.64 with BUE support Goal status: INITIAL  3.  Patient will increase 10 meter walk test to less than 30 seconds as to improve gait speed for better  community ambulation and to reduce fall risk. Baseline: 11/6: perform next session; 11/8: unable to perform full length: did 20.2 ft in 2 min 21 seconds with rollator Goal status: INITIAL  4.  Patient will increase BLE gross strength to 4+/5 as to improve functional strength for independent gait, increased standing tolerance and increased ADL ability. Baseline: 11/6: see above Goal status: INITIAL     PLAN:  PT FREQUENCY: 2x/week  PT DURATION: 12 weeks  PLANNED INTERVENTIONS: Therapeutic exercises, Therapeutic activity, Neuromuscular re-education, Balance training, Gait training, Patient/Family education, Joint mobilization, Stair training, Vestibular training, Canalith repositioning, DME instructions, Dry Needling, Cognitive remediation, Electrical stimulation, Wheelchair mobility training, Spinal mobilization, Cryotherapy, Moist heat, Taping, Vasopneumatic device, Traction, Ultrasound, Ionotophoresis 4mg /ml Dexamethasone, Manual therapy, and Re-evaluation  PLAN FOR NEXT SESSION: 5x STS, 10 MWT   , PT 03/08/2022, 9:50 AM

## 2022-03-13 ENCOUNTER — Other Ambulatory Visit: Payer: Self-pay

## 2022-03-13 ENCOUNTER — Emergency Department
Admission: EM | Admit: 2022-03-13 | Discharge: 2022-03-13 | Disposition: A | Discharge status: Home / Self Care | Payer: BC Managed Care – PPO | Attending: Emergency Medicine | Admitting: Emergency Medicine

## 2022-03-13 ENCOUNTER — Encounter: Payer: Self-pay | Admitting: Emergency Medicine

## 2022-03-13 ENCOUNTER — Ambulatory Visit: Payer: BC Managed Care – PPO

## 2022-03-13 DIAGNOSIS — R202 Paresthesia of skin: Secondary | ICD-10-CM | POA: Insufficient documentation

## 2022-03-13 DIAGNOSIS — T7840XA Allergy, unspecified, initial encounter: Secondary | ICD-10-CM | POA: Insufficient documentation

## 2022-03-13 MED ORDER — DIPHENHYDRAMINE HCL 50 MG/ML IJ SOLN: 12.5000 mg | Freq: Once | INTRAMUSCULAR | Status: DC

## 2022-03-13 MED ORDER — METHYLPREDNISOLONE SODIUM SUCC 125 MG IJ SOLR
125.0000 mg | Freq: Once | INTRAMUSCULAR | Status: AC
  Administered 2022-03-13: 125 mg via INTRAVENOUS
  Filled 2022-03-13: qty 2

## 2022-03-13 MED ORDER — ONDANSETRON HCL 4 MG/2ML IJ SOLN
4.0000 mg | Freq: Once | INTRAMUSCULAR | Status: AC
  Administered 2022-03-13: 4 mg via INTRAVENOUS
  Filled 2022-03-13: qty 2

## 2022-03-13 MED ORDER — FAMOTIDINE IN NACL 20-0.9 MG/50ML-% IV SOLN
20.0000 mg | Freq: Once | INTRAVENOUS | Status: AC
  Administered 2022-03-13: 20 mg via INTRAVENOUS
  Filled 2022-03-13: qty 50

## 2022-03-13 MED ORDER — PREDNISONE 50 MG PO TABS: 50.0000 mg | ORAL_TABLET | Freq: Every day | ORAL | 0 refills | Status: DC

## 2022-03-13 MED ORDER — DIPHENHYDRAMINE HCL 50 MG/ML IJ SOLN
25.0000 mg | Freq: Once | INTRAMUSCULAR | Status: AC
  Administered 2022-03-13: 25 mg via INTRAVENOUS
  Filled 2022-03-13: qty 1

## 2022-03-13 NOTE — ED Provider Triage Note (Signed)
Emergency Medicine Provider Triage Evaluation Note  Christine Chavez , a 41 y.o. female  was evaluated in triage.  Pt complains of allergic reaction after being given a Toradol injection.  Throat feels tight and she is developing hives.  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   Physical Exam  BP (!) 148/113 (BP Location: Right Arm)   Pulse 92   Temp 98.2 F (36.8 C) (Oral)   Resp 19   SpO2 100%  Gen:   Awake, no distress   Resp:  Normal effort  MSK:   Moves extremities without difficulty  Other:    Medical Decision Making  Medically screening exam initiated at 12:36 PM.  Appropriate orders placed.  Christine Chavez was informed that the remainder of the evaluation will be completed by another provider, this initial triage assessment does not replace that evaluation, and the importance of remaining in the ED until their evaluation is complete.  Patient moved to treatment room just after triage.   Chinita Pester, FNP 03/13/22 1853

## 2022-03-13 NOTE — ED Triage Notes (Incomplete)
Pt went to her neurologist this morning and had injections for her headaches and believes she is having a allergic reaction.  Pt has a rash to her chest, she states her throat feels like it needs to be cleared but she can't.

## 2022-03-13 NOTE — ED Notes (Signed)
Patient emptied urostomy bag into urinal. of clear yellow urine was dumped from urinal. Patient is talking on the phone, speech remains clear without any pauses.

## 2022-03-13 NOTE — ED Provider Notes (Signed)
[facility] Provider Note    Event Date/Time   First MD Initiated Contact with Patient 03/13/22 1242     (approximate)   History   Allergic Reaction   HPI  Christine Chavez is a 41 y.o. female who presents with likely low correction.  Patient reports she received a shot of Toradol at outpatient office, she is allergic to NSAIDs, has had anaphylactic reaction in the past to NSAIDs.  She reports tingling in her throat and rash on her chest.  No difficulty breathing at this point.  Has not received any medications     Physical Exam   Triage Vital Signs: ED Triage Vitals  Enc Vitals Group     BP 03/13/22 1230 (!) 148/113     Pulse Rate 03/13/22 1230 92     Resp 03/13/22 1230 19     Temp 03/13/22 1230 98.2 F (36.8 C)     Temp Source 03/13/22 1230 Oral     SpO2 03/13/22 1230 100 %     Weight 03/13/22 1332 63.5 kg (139 lb 15.9 oz)     Height 03/13/22 1332 1.702 m (5\' 7" )     Head Circumference --      Peak Flow --      Pain Score 03/13/22 1536 0     Pain Loc --      Pain Edu? --      Excl. in GC? --     Most recent vital signs: Vitals:   03/13/22 1315 03/13/22 1536  BP:  125/84  Pulse: 94 87  Resp: 15 14  Temp:  98 F (36.7 C)  SpO2: 100% 99%     General: Awake, no distress.  CV:  Good peripheral perfusion.  Resp:  Normal effort.  Abd:  No distention.     ED Results / Procedures / Treatments   Labs (all labs ordered are listed, but only abnormal results are displayed) Labs Reviewed - No data to display   EKG     RADIOLOGY     PROCEDURES:  Critical Care performed:   Procedures   MEDICATIONS ORDERED IN ED: Medications  methylPREDNISolone sodium succinate (SOLU-MEDROL) 125 mg/2 mL injection 125 mg (125 mg Intravenous Given 03/13/22 1255)  diphenhydrAMINE (BENADRYL) injection 25 mg (25 mg Intravenous Given 03/13/22 1249)  famotidine (PEPCID) IVPB 20 mg premix (0 mg Intravenous Stopped 03/13/22 1335)   ondansetron (ZOFRAN) injection 4 mg (4 mg Intravenous Given 03/13/22 1521)     IMPRESSION / MDM / ASSESSMENT AND PLAN / ED COURSE  I reviewed the triage vital signs and the nursing notes. Patient's presentation is most consistent with acute illness / injury with system symptoms.  Patient presents with symptoms consistent with allergic reaction, no difficulty breathing likely.  Will treat with IV Benadryl, IV Pepcid, IV Solu-Medrol and monitor carefully.  At this point no indication for epinephrine  Patient monitored in the emergency department and reevaluated every hour, rash has resolved and she is feeling much better.  She did have some nausea which improved with Zofran and ginger ale  No indication for admission she feels comfortable with discharge, she will return if any change.        FINAL CLINICAL IMPRESSION(S) / ED DIAGNOSES   Final diagnoses:  Allergic reaction, initial encounter     Rx / DC Orders   ED Discharge Orders          Ordered    predniSONE (DELTASONE) 50 MG tablet  Daily  with breakfast        03/13/22 1452             Note:  This document was prepared using Dragon voice recognition software and may include unintentional dictation errors.   Jene Every, MD 03/13/22 [phone]

## 2022-03-13 NOTE — ED Notes (Addendum)
Patient sent to ED from doctors office after receiving migraine cocktail- Toradol and phenergan. Patient states she is allergic to NSAIDS. C/o rash on chest and throat tightness. Patient speaking in full sentences at this time and taken back to triage.

## 2022-03-13 NOTE — ED Notes (Signed)
Patient was given a warm blanket and a drink of water per Dr. Cyril Loosen. Patient was able to swallow the water through a straw without difficulty, but c/o feeling a "knot" in her throat still. Dr. Cyril Loosen aware. Patient is able to  handle her oral secretions without difficulty. Speech is clear.

## 2022-03-13 NOTE — ED Notes (Signed)
Patient given ginger ale per Dr. Corky Downs.

## 2022-03-15 ENCOUNTER — Ambulatory Visit: Payer: BC Managed Care – PPO

## 2022-03-16 ENCOUNTER — Encounter: Payer: BC Managed Care – PPO | Admitting: Physical Therapy

## 2022-03-20 ENCOUNTER — Ambulatory Visit: Payer: BC Managed Care – PPO | Attending: Orthopedic Surgery

## 2022-03-20 DIAGNOSIS — M6281 Muscle weakness (generalized): Secondary | ICD-10-CM | POA: Diagnosis present

## 2022-03-20 DIAGNOSIS — M25552 Pain in left hip: Secondary | ICD-10-CM | POA: Diagnosis present

## 2022-03-20 DIAGNOSIS — R262 Difficulty in walking, not elsewhere classified: Secondary | ICD-10-CM | POA: Diagnosis present

## 2022-03-20 DIAGNOSIS — M25551 Pain in right hip: Secondary | ICD-10-CM | POA: Diagnosis present

## 2022-03-20 NOTE — Therapy (Signed)
OUTPATIENT PHYSICAL THERAPY LOWER EXTREMITY TREATMENT   Patient Name: Makilah Dowda MRN: [id number] DOB:08/12/1980, 41 y.o., female Today's Date: 03/20/2022   PT End of Session - 03/20/22 1054     Visit Number 5    Number of Visits 24    Date for PT Re-Evaluation 05/15/22    PT Start Time 1101    PT Stop Time 1145    PT Time Calculation (min) 44 min    Equipment Utilized During Treatment Gait belt    Activity Tolerance Patient tolerated treatment well;Patient limited by pain    Behavior During Therapy WFL for tasks assessed/performed               Past Medical History:  Diagnosis Date   Complication of anesthesia    ? seizures after anesthesia    Headache    Migraines    Neurogenic bladder    Renal disorder    Vision abnormalities    Past Surgical History:  Procedure Laterality Date   ANTERIOR CRUCIATE LIGAMENT REPAIR  1997   APPENDECTOMY     COLONOSCOPY WITH PROPOFOL N/A 11/11/2018   Procedure: COLONOSCOPY WITH PROPOFOL;  Surgeon: Christena Deem, MD;  Location: [facility] ENDOSCOPY;  Service: Endoscopy;  Laterality: N/A;   CYSTOSCOPY WITH STENT PLACEMENT Right 04/17/2016   Procedure: CYSTOSCOPY WITH STENT PLACEMENT;  Surgeon: Malen Gauze, MD;  Location: [facility] ORS;  Service: Urology;  Laterality: Right;   ESOPHAGOGASTRODUODENOSCOPY (EGD) WITH PROPOFOL N/A 11/11/2018   Procedure: ESOPHAGOGASTRODUODENOSCOPY (EGD) WITH PROPOFOL;  Surgeon: Christena Deem, MD;  Location: [facility] ENDOSCOPY;  Service: Endoscopy;  Laterality: N/A;   EXPLORATORY LAPAROTOMY  1999   KIDNEY STONE SURGERY  04/2016   REVISION UROSTOMY CUTANEOUS     REVISION UROSTOMY CUTANEOUS  01/10/2018   SUPRAPUBIC CATHETER PLACEMENT  08/2017   TONSILLECTOMY     Patient Active Problem List   Diagnosis Date Noted   Seizure (HCC) 11/11/2018   Major depressive disorder, recurrent episode, moderate (HCC) 02/07/2018   Nephrolithiasis 04/16/2016   Numbness 07/28/2015   Bladder retention 06/23/2015    Abdominal pain 06/04/2015   Dizziness 05/18/2015   Neck pain 05/18/2015   Complicated migraine 04/28/2015   Other fatigue 04/28/2015   Abnormal finding on MRI of brain 04/28/2015   D (diarrhea) 03/29/2015   H/O disease 03/29/2015   Abnormal weight loss 03/29/2015   Muscle weakness (generalized) 03/14/2015   Headache, migraine 03/10/2015    PCP: Ermalinda Memos MD  REFERRING PROVIDER: Lacy Duverney MD  REFERRING DIAG: s/p hip surgery, degenerative tear of acetabular labrum   THERAPY DIAG:  Pain in left hip  Muscle weakness (generalized)  Difficulty in walking, not elsewhere classified  Pain in right hip  Rationale for Evaluation and Treatment: Rehabilitation  ONSET DATE: 12/28/21  SUBJECTIVE:   SUBJECTIVE STATEMENT: Patient missed last week due to physician appointments and ride issues. She underwent a cystoscopy on 03/16/22. Is still having bleeding and pain when she stands.   PERTINENT HISTORY: Patient is returning to PT s/p L hip surgery 12/28/21.  Patient received HHPT afterwards. 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 Patient is going to get a whole scope of bladder 03/16/22. Patient has been seen prior to surgery by this author for her Hereditary Spastic Paraplegia (HSP). PMH includes bilateral Le edema, BLE weakness, constipation, chronic pain, GERD, tremor, HSP, migraine, neurogenic bladder,seizures, depression, migraine, COVID 19, chronic UTI, urostomy. Patient is on protocol of 50% WB  until week 8.  PAIN:  Are you having pain? Yes: NPRS scale: 7/10 Pain location: bladder Pain description: spasm Aggravating factors: n/a Relieving factors: n/a   PRECAUTIONS: Anterior hip and Fall  WEIGHT BEARING RESTRICTIONS: Yes WBAT  at 8 weeks, 50% WB today.   FALLS:  Has patient fallen in last 6 months? No  LIVING ENVIRONMENT: Lives with: lives with their family Lives in:  House/apartment Stairs: Yes: External: 0 steps; ramp Has following equipment at home: Walker - 4 wheeled and Wheelchair (manual)    PLOF: Independent with basic ADLs  PATIENT GOALS: to be able to ADLs independently.    OBJECTIVE:   DIAGNOSTIC FINDINGS: s/p surgery  PATIENT SURVEYS:  FOTO 34  COGNITION: Overall cognitive status: Within functional limits for tasks assessed     SENSATION: Light touch: Impaired L lateral thigh      POSTURE: No Significant postural limitations  PALPATION: Tender at post surgical site  LOWER EXTREMITY ROM:  Active ROM Right eval Left eval  Hip flexion PROM 70 AROM 35 PROM 65 AROM 5  Hip abduction PROM 25 AROM 23 PROM 35 AROM 4    (Blank rows = not tested)  LOWER EXTREMITY MMT:  MMT Right eval Left eval  Hip flexion 4/5 3+/5  Hip extension    Hip abduction 4-/5 3/5  Hip adduction 3+/5 3/5  Hip internal rotation    Hip external rotation    Knee flexion 4-/5 3+/5 Painful  Knee extension 4-/5 3/5  Ankle dorsiflexion 3+/5 3/5  Ankle plantarflexion 3+/5 3/5  Ankle inversion    Ankle eversion     (Blank rows = not tested)  FUNCTIONAL TESTS:  5 times sit to stand: 43.64  SPT: 50% WB on LLE; Min A for performance  Supine<>sit CGA with min A for LLE   GAIT: Will assess next session when WBAT   TODAY'S TREATMENT:                                                                                                                              DATE: 03/20/22 Manual: Hamstring lengthening stretch 60 seconds each LE Single knee to chest 60 seconds each LE Figure four modified 60 seconds each LE   TherEx Supine: BTB abduction 15x Bridge 15x; x2 sets Posterior pelvic tilt 10x Modified march with BTB15x each LE Green swiss ball hamstring curl 15x Green swiss ball TrA activation 15x 3 seconds   PATIENT EDUCATION:  Education details: goals, POC  Person educated: Patient Education method: Explanation Education  comprehension: verbalized understanding and returned demonstration  HOME EXERCISE PROGRAM: Give next session  ASSESSMENT:  CLINICAL IMPRESSION: Patient unable to perform standing interventions this session due to persistent bleeding. Due to patient feeling dizzy BP taken in seated: 127/95 pulse 78. Patient is able to tolerate supine interventions well with intermittent pain noted in abdomen. Patient educated on need to follow up with physician about continuation of pain. Patient will benefit from skilled  physical therapy to increase functional mobility, strength, and quality of life.   OBJECTIVE IMPAIRMENTS: Abnormal gait, decreased activity tolerance, decreased balance, decreased mobility, difficulty walking, decreased ROM, decreased strength, increased fascial restrictions, impaired perceived functional ability, impaired flexibility, impaired sensation, improper body mechanics, and pain.   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 PMH includes bilateral Le edema, BLE weakness, constipation, chronic pain, GERD, tremor, HSP, migraine, neurogenic bladder,seizures, depression, migraine, COVID 19, chronic UTI, urostomy  are also affecting patient's functional outcome.   REHAB POTENTIAL: Good  CLINICAL DECISION MAKING: Evolving/moderate complexity  EVALUATION COMPLEXITY: Moderate   GOALS: Goals reviewed with patient? Yes  SHORT TERM GOALS: Target date: 03/20/2022  Patient will be  independent in home exercise program to improve strength/mobility for better functional independence with ADLs. Baseline: 02/20/22; give next session Goal status: INITIAL    LONG TERM GOALS: Target date: 05/15/2022   Patient will increase FOTO score to equal to or greater than  49%   to demonstrate statistically significant improvement in mobility and quality of life.  Baseline: 11/6: 34% Goal status: INITIAL  2.   Patient (< 25 years old) will complete five times sit to stand test in < 10 seconds indicating an increased LE strength and improved balance. Baseline: 11/6: perform next session 11/8: 43.64 with BUE support Goal status: INITIAL  3.  Patient will increase 10 meter walk test to less than 30 seconds as to improve gait speed for better community ambulation and to reduce fall risk. Baseline: 11/6: perform next session; 11/8: unable to perform full length: did 20.2 ft in 2 min 21 seconds with rollator Goal status: INITIAL  4.  Patient will increase BLE gross strength to 4+/5 as to improve functional strength for independent gait, increased standing tolerance and increased ADL ability. Baseline: 11/6: see above Goal status: INITIAL     PLAN:  PT FREQUENCY: 2x/week  PT DURATION: 12 weeks  PLANNED INTERVENTIONS: Therapeutic exercises, Therapeutic activity, Neuromuscular re-education, Balance training, Gait training, Patient/Family education, Joint mobilization, Stair training, Vestibular training, Canalith repositioning, DME instructions, Dry Needling, Cognitive remediation, Electrical stimulation, Wheelchair mobility training, Spinal mobilization, Cryotherapy, Moist heat, Taping, Vasopneumatic device, Traction, Ultrasound, Ionotophoresis 4mg /ml Dexamethasone, Manual therapy, and Re-evaluation  PLAN FOR NEXT SESSION: 5x STS, 10 MWT   Janna Arch, PT 03/20/2022, 1:52 PM

## 2022-03-22 ENCOUNTER — Ambulatory Visit: Payer: BC Managed Care – PPO

## 2022-03-22 NOTE — Therapy (Incomplete)
OUTPATIENT PHYSICAL THERAPY LOWER EXTREMITY TREATMENT   Patient Name: Tru Leopard MRN: [id number] DOB:1980/12/05, 41 y.o., female Today's Date: 03/22/2022       Past Medical History:  Diagnosis Date   Complication of anesthesia    ? seizures after anesthesia    Headache    Migraines    Neurogenic bladder    Renal disorder    Vision abnormalities    Past Surgical History:  Procedure Laterality Date   ANTERIOR CRUCIATE LIGAMENT REPAIR  1997   APPENDECTOMY     COLONOSCOPY WITH PROPOFOL N/A 11/11/2018   Procedure: COLONOSCOPY WITH PROPOFOL;  Surgeon: Christena Deem, MD;  Location: [facility] ENDOSCOPY;  Service: Endoscopy;  Laterality: N/A;   CYSTOSCOPY WITH STENT PLACEMENT Right 04/17/2016   Procedure: CYSTOSCOPY WITH STENT PLACEMENT;  Surgeon: Malen Gauze, MD;  Location: [facility] ORS;  Service: Urology;  Laterality: Right;   ESOPHAGOGASTRODUODENOSCOPY (EGD) WITH PROPOFOL N/A 11/11/2018   Procedure: ESOPHAGOGASTRODUODENOSCOPY (EGD) WITH PROPOFOL;  Surgeon: Christena Deem, MD;  Location: [facility] ENDOSCOPY;  Service: Endoscopy;  Laterality: N/A;   EXPLORATORY LAPAROTOMY  1999   KIDNEY STONE SURGERY  04/2016   REVISION UROSTOMY CUTANEOUS     REVISION UROSTOMY CUTANEOUS  01/10/2018   SUPRAPUBIC CATHETER PLACEMENT  08/2017   TONSILLECTOMY     Patient Active Problem List   Diagnosis Date Noted   Seizure (HCC) 11/11/2018   Major depressive disorder, recurrent episode, moderate (HCC) 02/07/2018   Nephrolithiasis 04/16/2016   Numbness 07/28/2015   Bladder retention 06/23/2015   Abdominal pain 06/04/2015   Dizziness 05/18/2015   Neck pain 05/18/2015   Complicated migraine 04/28/2015   Other fatigue 04/28/2015   Abnormal finding on MRI of brain 04/28/2015   D (diarrhea) 03/29/2015   H/O disease 03/29/2015   Abnormal weight loss 03/29/2015   Muscle weakness (generalized) 03/14/2015   Headache, migraine 03/10/2015    PCP: Ermalinda Memos MD  REFERRING PROVIDER:  Lacy Duverney MD  REFERRING DIAG: s/p hip surgery, degenerative tear of acetabular labrum   THERAPY DIAG:  No diagnosis found.  Rationale for Evaluation and Treatment: Rehabilitation  ONSET DATE: 12/28/21  SUBJECTIVE:   SUBJECTIVE STATEMENT: ***  PERTINENT HISTORY: Patient is returning to PT s/p L hip surgery 12/28/21.  Patient received HHPT afterwards. 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 Patient is going to get a whole scope of bladder 03/16/22. Patient has been seen prior to surgery by this author for her Hereditary Spastic Paraplegia (HSP). PMH includes bilateral Le edema, BLE weakness, constipation, chronic pain, GERD, tremor, HSP, migraine, neurogenic bladder,seizures, depression, migraine, COVID 19, chronic UTI, urostomy. Patient is on protocol of 50% WB until week 8.  PAIN:  Are you having pain? Yes: NPRS scale: 7/10 Pain location: bladder Pain description: spasm Aggravating factors: n/a Relieving factors: n/a   PRECAUTIONS: Anterior hip and Fall  WEIGHT BEARING RESTRICTIONS: Yes WBAT  at 8 weeks, 50% WB today.   FALLS:  Has patient fallen in last 6 months? No  LIVING ENVIRONMENT: Lives with: lives with their family Lives in: House/apartment Stairs: Yes: External: 0 steps; ramp Has following equipment at home: Walker - 4 wheeled and Wheelchair (manual)    PLOF: Independent with basic ADLs  PATIENT GOALS: to be able to ADLs independently.    OBJECTIVE:   DIAGNOSTIC FINDINGS: s/p surgery  PATIENT SURVEYS:  FOTO 34  COGNITION: Overall cognitive status: Within functional limits for tasks assessed  SENSATION: Light touch: Impaired L lateral thigh      POSTURE: No Significant postural limitations  PALPATION: Tender at post surgical site  LOWER EXTREMITY ROM:  Active ROM Right eval Left eval  Hip flexion PROM 70 AROM 35 PROM 65 AROM 5  Hip abduction PROM 25 AROM  23 PROM 35 AROM 4    (Blank rows = not tested)  LOWER EXTREMITY MMT:  MMT Right eval Left eval  Hip flexion 4/5 3+/5  Hip extension    Hip abduction 4-/5 3/5  Hip adduction 3+/5 3/5  Hip internal rotation    Hip external rotation    Knee flexion 4-/5 3+/5 Painful  Knee extension 4-/5 3/5  Ankle dorsiflexion 3+/5 3/5  Ankle plantarflexion 3+/5 3/5  Ankle inversion    Ankle eversion     (Blank rows = not tested)  FUNCTIONAL TESTS:  5 times sit to stand: 43.64  SPT: 50% WB on LLE; Min A for performance  Supine<>sit CGA with min A for LLE   GAIT: Will assess next session when WBAT   TODAY'S TREATMENT:                                                                                                                              DATE: 03/22/22 Manual: Hamstring lengthening stretch 60 seconds each LE Single knee to chest 60 seconds each LE Figure four modified 60 seconds each LE   TherEx Supine: BTB abduction 15x Bridge 15x; x2 sets Posterior pelvic tilt 10x Modified march with BTB15x each LE Green swiss ball hamstring curl 15x Green swiss ball TrA activation 15x 3 seconds   PATIENT EDUCATION:  Education details: goals, POC  Person educated: Patient Education method: Explanation Education comprehension: verbalized understanding and returned demonstration  HOME EXERCISE PROGRAM: Give next session  ASSESSMENT:  CLINICAL IMPRESSION: ***Patient will benefit from skilled physical therapy to increase functional mobility, strength, and quality of life.   OBJECTIVE IMPAIRMENTS: Abnormal gait, decreased activity tolerance, decreased balance, decreased mobility, difficulty walking, decreased ROM, decreased strength, increased fascial restrictions, impaired perceived functional ability, impaired flexibility, impaired sensation, improper body mechanics, and pain.   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 PMH includes bilateral Le edema, BLE weakness, constipation, chronic pain, GERD, tremor, HSP, migraine, neurogenic bladder,seizures, depression, migraine, COVID 19, chronic UTI, urostomy  are also affecting patient's functional outcome.   REHAB POTENTIAL: Good  CLINICAL DECISION MAKING: Evolving/moderate complexity  EVALUATION COMPLEXITY: Moderate   GOALS: Goals reviewed with patient? Yes  SHORT TERM GOALS: Target date: 03/20/2022  Patient will be independent in home exercise program to improve strength/mobility for better functional independence  with ADLs. Baseline: 02/20/22; give next session Goal status: INITIAL    LONG TERM GOALS: Target date: 05/15/2022   Patient will increase FOTO score to equal to or greater than  49%   to demonstrate statistically significant improvement in mobility and quality of life.  Baseline: 11/6: 34% Goal status: INITIAL  2.   Patient (< 21 years old) will complete five times sit to stand test in < 10 seconds indicating an increased LE strength and improved balance. Baseline: 11/6: perform next session 11/8: 43.64 with BUE support Goal status: INITIAL  3.  Patient will increase 10 meter walk test to less than 30 seconds as to improve gait speed for better community ambulation and to reduce fall risk. Baseline: 11/6: perform next session; 11/8: unable to perform full length: did 20.2 ft in 2 min 21 seconds with rollator Goal status: INITIAL  4.  Patient will increase BLE gross strength to 4+/5 as to improve  functional strength for independent gait, increased standing tolerance and increased ADL ability. Baseline: 11/6: see above Goal status: INITIAL     PLAN:  PT FREQUENCY: 2x/week  PT DURATION: 12 weeks  PLANNED INTERVENTIONS: Therapeutic exercises, Therapeutic activity, Neuromuscular re-education, Balance training, Gait training, Patient/Family education, Joint mobilization, Stair training, Vestibular training, Canalith repositioning, DME instructions, Dry Needling, Cognitive remediation, Electrical stimulation, Wheelchair mobility training, Spinal mobilization, Cryotherapy, Moist heat, Taping, Vasopneumatic device, Traction, Ultrasound, Ionotophoresis 4mg /ml Dexamethasone, Manual therapy, and Re-evaluation  PLAN FOR NEXT SESSION: 5x STS, 10 MWT   Janna Arch, PT 03/22/2022, 7:05 AM

## 2022-03-23 ENCOUNTER — Encounter: Payer: BC Managed Care – PPO | Admitting: Physical Therapy

## 2022-03-23 NOTE — Therapy (Incomplete)
OUTPATIENT PHYSICAL THERAPY LOWER EXTREMITY TREATMENT   Patient Name: Christine Chavez MRN: [id number] DOB:1980-09-11, 41 y.o., female Today's Date: 03/23/2022       Past Medical History:  Diagnosis Date   Complication of anesthesia    ? seizures after anesthesia    Headache    Migraines    Neurogenic bladder    Renal disorder    Vision abnormalities    Past Surgical History:  Procedure Laterality Date   ANTERIOR CRUCIATE LIGAMENT REPAIR  1997   APPENDECTOMY     COLONOSCOPY WITH PROPOFOL N/A 11/11/2018   Procedure: COLONOSCOPY WITH PROPOFOL;  Surgeon: Christena Deem, MD;  Location: [facility] ENDOSCOPY;  Service: Endoscopy;  Laterality: N/A;   CYSTOSCOPY WITH STENT PLACEMENT Right 04/17/2016   Procedure: CYSTOSCOPY WITH STENT PLACEMENT;  Surgeon: Malen Gauze, MD;  Location: [facility] ORS;  Service: Urology;  Laterality: Right;   ESOPHAGOGASTRODUODENOSCOPY (EGD) WITH PROPOFOL N/A 11/11/2018   Procedure: ESOPHAGOGASTRODUODENOSCOPY (EGD) WITH PROPOFOL;  Surgeon: Christena Deem, MD;  Location: [facility] ENDOSCOPY;  Service: Endoscopy;  Laterality: N/A;   EXPLORATORY LAPAROTOMY  1999   KIDNEY STONE SURGERY  04/2016   REVISION UROSTOMY CUTANEOUS     REVISION UROSTOMY CUTANEOUS  01/10/2018   SUPRAPUBIC CATHETER PLACEMENT  08/2017   TONSILLECTOMY     Patient Active Problem List   Diagnosis Date Noted   Seizure (HCC) 11/11/2018   Major depressive disorder, recurrent episode, moderate (HCC) 02/07/2018   Nephrolithiasis 04/16/2016   Numbness 07/28/2015   Bladder retention 06/23/2015   Abdominal pain 06/04/2015   Dizziness 05/18/2015   Neck pain 05/18/2015   Complicated migraine 04/28/2015   Other fatigue 04/28/2015   Abnormal finding on MRI of brain 04/28/2015   D (diarrhea) 03/29/2015   H/O disease 03/29/2015   Abnormal weight loss 03/29/2015   Muscle weakness (generalized) 03/14/2015   Headache, migraine 03/10/2015    PCP: Ermalinda Memos MD  REFERRING PROVIDER:  Lacy Duverney MD  REFERRING DIAG: s/p hip surgery, degenerative tear of acetabular labrum   THERAPY DIAG:  No diagnosis found.  Rationale for Evaluation and Treatment: Rehabilitation  ONSET DATE: 12/28/21  SUBJECTIVE:   SUBJECTIVE STATEMENT: ***  PERTINENT HISTORY: Patient is returning to PT s/p L hip surgery 12/28/21.  Patient received HHPT afterwards. 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 Patient is going to get a whole scope of bladder 03/16/22. Patient has been seen prior to surgery by this author for her Hereditary Spastic Paraplegia (HSP). PMH includes bilateral Le edema, BLE weakness, constipation, chronic pain, GERD, tremor, HSP, migraine, neurogenic bladder,seizures, depression, migraine, COVID 19, chronic UTI, urostomy. Patient is on protocol of 50% WB until week 8.  PAIN:  Are you having pain? Yes: NPRS scale: 7/10 Pain location: bladder Pain description: spasm Aggravating factors: n/a Relieving factors: n/a   PRECAUTIONS: Anterior hip and Fall  WEIGHT BEARING RESTRICTIONS: Yes WBAT  at 8 weeks, 50% WB today.   FALLS:  Has patient fallen in last 6 months? No  LIVING ENVIRONMENT: Lives with: lives with their family Lives in: House/apartment Stairs: Yes: External: 0 steps; ramp Has following equipment at home: Walker - 4 wheeled and Wheelchair (manual)    PLOF: Independent with basic ADLs  PATIENT GOALS: to be able to ADLs independently.    OBJECTIVE:   DIAGNOSTIC FINDINGS: s/p surgery  PATIENT SURVEYS:  FOTO 34  COGNITION: Overall cognitive status: Within functional limits for tasks assessed  SENSATION: Light touch: Impaired L lateral thigh      POSTURE: No Significant postural limitations  PALPATION: Tender at post surgical site  LOWER EXTREMITY ROM:  Active ROM Right eval Left eval  Hip flexion PROM 70 AROM 35 PROM 65 AROM 5  Hip abduction PROM 25 AROM  23 PROM 35 AROM 4    (Blank rows = not tested)  LOWER EXTREMITY MMT:  MMT Right eval Left eval  Hip flexion 4/5 3+/5  Hip extension    Hip abduction 4-/5 3/5  Hip adduction 3+/5 3/5  Hip internal rotation    Hip external rotation    Knee flexion 4-/5 3+/5 Painful  Knee extension 4-/5 3/5  Ankle dorsiflexion 3+/5 3/5  Ankle plantarflexion 3+/5 3/5  Ankle inversion    Ankle eversion     (Blank rows = not tested)  FUNCTIONAL TESTS:  5 times sit to stand: 43.64  SPT: 50% WB on LLE; Min A for performance  Supine<>sit CGA with min A for LLE   GAIT: Will assess next session when WBAT   TODAY'S TREATMENT:                                                                                                                              DATE: 03/23/22 Manual: Hamstring lengthening stretch 60 seconds each LE Single knee to chest 60 seconds each LE Figure four modified 60 seconds each LE   TherEx Supine: BTB abduction 15x Bridge 15x; x2 sets Posterior pelvic tilt 10x Modified march with BTB15x each LE Green swiss ball hamstring curl 15x Green swiss ball TrA activation 15x 3 seconds   PATIENT EDUCATION:  Education details: goals, POC  Person educated: Patient Education method: Explanation Education comprehension: verbalized understanding and returned demonstration  HOME EXERCISE PROGRAM: Give next session  ASSESSMENT:  CLINICAL IMPRESSION: ***Patient will benefit from skilled physical therapy to increase functional mobility, strength, and quality of life.   OBJECTIVE IMPAIRMENTS: Abnormal gait, decreased activity tolerance, decreased balance, decreased mobility, difficulty walking, decreased ROM, decreased strength, increased fascial restrictions, impaired perceived functional ability, impaired flexibility, impaired sensation, improper body mechanics, and pain.   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 PMH includes bilateral Le edema, BLE weakness, constipation, chronic pain, GERD, tremor, HSP, migraine, neurogenic bladder,seizures, depression, migraine, COVID 19, chronic UTI, urostomy  are also affecting patient's functional outcome.   REHAB POTENTIAL: Good  CLINICAL DECISION MAKING: Evolving/moderate complexity  EVALUATION COMPLEXITY: Moderate   GOALS: Goals reviewed with patient? Yes  SHORT TERM GOALS: Target date: 03/20/2022  Patient will be independent in home exercise program to improve strength/mobility for better functional independence  with ADLs. Baseline: 02/20/22; give next session Goal status: INITIAL    LONG TERM GOALS: Target date: 05/15/2022   Patient will increase FOTO score to equal to or greater than  49%   to demonstrate statistically significant improvement in mobility and quality of life.  Baseline: 11/6: 34% Goal status: INITIAL  2.   Patient (< 40 years old) will complete five times sit to stand test in < 10 seconds indicating an increased LE strength and improved balance. Baseline: 11/6: perform next session 11/8: 43.64 with BUE support Goal status: INITIAL  3.  Patient will increase 10 meter walk test to less than 30 seconds as to improve gait speed for better community ambulation and to reduce fall risk. Baseline: 11/6: perform next session; 11/8: unable to perform full length: did 20.2 ft in 2 min 21 seconds with rollator Goal status: INITIAL  4.  Patient will increase BLE gross strength to 4+/5 as to improve  functional strength for independent gait, increased standing tolerance and increased ADL ability. Baseline: 11/6: see above Goal status: INITIAL     PLAN:  PT FREQUENCY: 2x/week  PT DURATION: 12 weeks  PLANNED INTERVENTIONS: Therapeutic exercises, Therapeutic activity, Neuromuscular re-education, Balance training, Gait training, Patient/Family education, Joint mobilization, Stair training, Vestibular training, Canalith repositioning, DME instructions, Dry Needling, Cognitive remediation, Electrical stimulation, Wheelchair mobility training, Spinal mobilization, Cryotherapy, Moist heat, Taping, Vasopneumatic device, Traction, Ultrasound, Ionotophoresis 4mg /ml Dexamethasone, Manual therapy, and Re-evaluation  PLAN FOR NEXT SESSION: 5x STS, 10 MWT   , PT 03/23/2022, 3:28 PM

## 2022-03-27 ENCOUNTER — Ambulatory Visit: Payer: BC Managed Care – PPO

## 2022-03-28 NOTE — Therapy (Signed)
OUTPATIENT PHYSICAL THERAPY LOWER EXTREMITY TREATMENT   Patient Name: Christine Chavez MRN: [id number] DOB:03/22/1981, 41 y.o., female Today's Date: 03/23/2022       Past Medical History:  Diagnosis Date   Complication of anesthesia    ? seizures after anesthesia    Headache    Migraines    Neurogenic bladder    Renal disorder    Vision abnormalities    Past Surgical History:  Procedure Laterality Date   ANTERIOR CRUCIATE LIGAMENT REPAIR  1997   APPENDECTOMY     COLONOSCOPY WITH PROPOFOL N/A 11/11/2018   Procedure: COLONOSCOPY WITH PROPOFOL;  Surgeon: Skulskie, Martin U, MD;  Location: [facility] ENDOSCOPY;  Service: Endoscopy;  Laterality: N/A;   CYSTOSCOPY WITH STENT PLACEMENT Right 04/17/2016   Procedure: CYSTOSCOPY WITH STENT PLACEMENT;  Surgeon: Patrick L McKenzie, MD;  Location: [facility] ORS;  Service: Urology;  Laterality: Right;   ESOPHAGOGASTRODUODENOSCOPY (EGD) WITH PROPOFOL N/A 11/11/2018   Procedure: ESOPHAGOGASTRODUODENOSCOPY (EGD) WITH PROPOFOL;  Surgeon: Skulskie, Martin U, MD;  Location: [facility] ENDOSCOPY;  Service: Endoscopy;  Laterality: N/A;   EXPLORATORY LAPAROTOMY  1999   KIDNEY STONE SURGERY  04/2016   REVISION UROSTOMY CUTANEOUS     REVISION UROSTOMY CUTANEOUS  01/10/2018   SUPRAPUBIC CATHETER PLACEMENT  08/2017   TONSILLECTOMY     Patient Active Problem List   Diagnosis Date Noted   Seizure (HCC) 11/11/2018   Major depressive disorder, recurrent episode, moderate (HCC) 02/07/2018   Nephrolithiasis 04/16/2016   Numbness 07/28/2015   Bladder retention 06/23/2015   Abdominal pain 06/04/2015   Dizziness 05/18/2015   Neck pain 05/18/2015   Complicated migraine 04/28/2015   Other fatigue 04/28/2015   Abnormal finding on MRI of brain 04/28/2015   D (diarrhea) 03/29/2015   H/O disease 03/29/2015   Abnormal weight loss 03/29/2015   Muscle weakness (generalized) 03/14/2015   Headache, migraine 03/10/2015    PCP: Jose-Mathews, Jessnie MD  REFERRING PROVIDER:  Olson, Steven MD  REFERRING DIAG: s/p hip surgery, degenerative tear of acetabular labrum   THERAPY DIAG:  No diagnosis found.  Rationale for Evaluation and Treatment: Rehabilitation  ONSET DATE: 12/28/21  SUBJECTIVE:   SUBJECTIVE STATEMENT: ***  PERTINENT HISTORY: Patient is returning to PT s/p L hip surgery 12/28/21.  Patient received HHPT afterwards. 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 Patient is going to get a whole scope of bladder 03/16/22. Patient has been seen prior to surgery by this author for her Hereditary Spastic Paraplegia (HSP). PMH includes bilateral Le edema, BLE weakness, constipation, chronic pain, GERD, tremor, HSP, migraine, neurogenic bladder,seizures, depression, migraine, COVID 19, chronic UTI, urostomy. Patient is on protocol of 50% WB until week 8.  PAIN:  Are you having pain? Yes: NPRS scale: 7/10 Pain location: bladder Pain description: spasm Aggravating factors: n/a Relieving factors: n/a   PRECAUTIONS: Anterior hip and Fall  WEIGHT BEARING RESTRICTIONS: Yes WBAT  at 8 weeks, 50% WB today.   FALLS:  Has patient fallen in last 6 months? No  LIVING ENVIRONMENT: Lives with: lives with their family Lives in: House/apartment Stairs: Yes: External: 0 steps; ramp Has following equipment at home: Walker - 4 wheeled and Wheelchair (manual)    PLOF: Independent with basic ADLs  PATIENT GOALS: to be able to ADLs independently.    OBJECTIVE:   DIAGNOSTIC FINDINGS: s/p surgery  PATIENT SURVEYS:  FOTO 34  COGNITION: Overall cognitive status: Within functional limits for tasks assessed       SENSATION: Light touch: Impaired L lateral thigh      POSTURE: No Significant postural limitations  PALPATION: Tender at post surgical site  LOWER EXTREMITY ROM:  Active ROM Right eval Left eval  Hip flexion PROM 70 AROM 35 PROM 65 AROM 5  Hip abduction PROM 25 AROM  23 PROM 35 AROM 4    (Blank rows = not tested)  LOWER EXTREMITY MMT:  MMT Right eval Left eval  Hip flexion 4/5 3+/5  Hip extension    Hip abduction 4-/5 3/5  Hip adduction 3+/5 3/5  Hip internal rotation    Hip external rotation    Knee flexion 4-/5 3+/5 Painful  Knee extension 4-/5 3/5  Ankle dorsiflexion 3+/5 3/5  Ankle plantarflexion 3+/5 3/5  Ankle inversion    Ankle eversion     (Blank rows = not tested)  FUNCTIONAL TESTS:  5 times sit to stand: 43.64  SPT: 50% WB on LLE; Min A for performance  Supine<>sit CGA with min A for LLE   GAIT: Will assess next session when WBAT   TODAY'S TREATMENT:                                                                                                                              DATE: 03/23/22 Manual: Hamstring lengthening stretch 60 seconds each LE Single knee to chest 60 seconds each LE Figure four modified 60 seconds each LE   TherEx Supine: BTB abduction 15x Bridge 15x; x2 sets Posterior pelvic tilt 10x Modified march with BTB15x each LE Green swiss ball hamstring curl 15x Green swiss ball TrA activation 15x 3 seconds   PATIENT EDUCATION:  Education details: goals, POC  Person educated: Patient Education method: Explanation Education comprehension: verbalized understanding and returned demonstration  HOME EXERCISE PROGRAM: Give next session  ASSESSMENT:  CLINICAL IMPRESSION: ***Patient will benefit from skilled physical therapy to increase functional mobility, strength, and quality of life.   OBJECTIVE IMPAIRMENTS: Abnormal gait, decreased activity tolerance, decreased balance, decreased mobility, difficulty walking, decreased ROM, decreased strength, increased fascial restrictions, impaired perceived functional ability, impaired flexibility, impaired sensation, improper body mechanics, and pain.   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 PMH includes bilateral Le edema, BLE weakness, constipation, chronic pain, GERD, tremor, HSP, migraine, neurogenic bladder,seizures, depression, migraine, COVID 19, chronic UTI, urostomy  are also affecting patient's functional outcome.   REHAB POTENTIAL: Good  CLINICAL DECISION MAKING: Evolving/moderate complexity  EVALUATION COMPLEXITY: Moderate   GOALS: Goals reviewed with patient? Yes  SHORT TERM GOALS: Target date: 03/20/2022  Patient will be independent in home exercise program to improve strength/mobility for better functional independence  with ADLs. Baseline: 02/20/22; give next session Goal status: INITIAL    LONG TERM GOALS: Target date: 05/15/2022   Patient will increase FOTO score to equal to or greater than  49%   to demonstrate statistically significant improvement in mobility and quality of life.  Baseline: 11/6: 34% Goal status: INITIAL  2.   Patient (< 60 years old) will complete five times sit to stand test in < 10 seconds indicating an increased LE strength and improved balance. Baseline: 11/6: perform next session 11/8: 43.64 with BUE support Goal status: INITIAL  3.  Patient will increase 10 meter walk test to less than 30 seconds as to improve gait speed for better community ambulation and to reduce fall risk. Baseline: 11/6: perform next session; 11/8: unable to perform full length: did 20.2 ft in 2 min 21 seconds with rollator Goal status: INITIAL  4.  Patient will increase BLE gross strength to 4+/5 as to improve  functional strength for independent gait, increased standing tolerance and increased ADL ability. Baseline: 11/6: see above Goal status: INITIAL     PLAN:  PT FREQUENCY: 2x/week  PT DURATION: 12 weeks  PLANNED INTERVENTIONS: Therapeutic exercises, Therapeutic activity, Neuromuscular re-education, Balance training, Gait training, Patient/Family education, Joint mobilization, Stair training, Vestibular training, Canalith repositioning, DME instructions, Dry Needling, Cognitive remediation, Electrical stimulation, Wheelchair mobility training, Spinal mobilization, Cryotherapy, Moist heat, Taping, Vasopneumatic device, Traction, Ultrasound, Ionotophoresis 4mg/ml Dexamethasone, Manual therapy, and Re-evaluation  PLAN FOR NEXT SESSION: 5x STS, 10 MWT   Wilmoth Rasnic, PT 03/23/2022, 3:28 PM  

## 2022-03-29 ENCOUNTER — Ambulatory Visit: Payer: BC Managed Care – PPO

## 2022-03-29 DIAGNOSIS — M25552 Pain in left hip: Secondary | ICD-10-CM

## 2022-03-29 DIAGNOSIS — R262 Difficulty in walking, not elsewhere classified: Secondary | ICD-10-CM

## 2022-03-29 DIAGNOSIS — M6281 Muscle weakness (generalized): Secondary | ICD-10-CM

## 2022-03-30 ENCOUNTER — Encounter: Payer: BC Managed Care – PPO | Admitting: Physical Therapy

## 2022-03-30 NOTE — Therapy (Signed)
OUTPATIENT PHYSICAL THERAPY LOWER EXTREMITY TREATMENT   Patient Name: Christine Chavez MRN: [id number] DOB:Nov 18, 1980, 41 y.o., female Today's Date: 04/03/2022   PT End of Session - 04/03/22 1100     Visit Number 7    Number of Visits 24    Date for PT Re-Evaluation 05/15/22    PT Start Time 1100    PT Stop Time 1144    PT Time Calculation (min) 44 min    Equipment Utilized During Treatment Gait belt    Activity Tolerance Patient tolerated treatment well;Patient limited by pain    Behavior During Therapy WFL for tasks assessed/performed                 Past Medical History:  Diagnosis Date   Complication of anesthesia    ? seizures after anesthesia    Headache    Migraines    Neurogenic bladder    Renal disorder    Vision abnormalities    Past Surgical History:  Procedure Laterality Date   ANTERIOR CRUCIATE LIGAMENT REPAIR  1997   APPENDECTOMY     COLONOSCOPY WITH PROPOFOL N/A 11/11/2018   Procedure: COLONOSCOPY WITH PROPOFOL;  Surgeon: Christena Deem, MD;  Location: [facility] ENDOSCOPY;  Service: Endoscopy;  Laterality: N/A;   CYSTOSCOPY WITH STENT PLACEMENT Right 04/17/2016   Procedure: CYSTOSCOPY WITH STENT PLACEMENT;  Surgeon: Malen Gauze, MD;  Location: [facility] ORS;  Service: Urology;  Laterality: Right;   ESOPHAGOGASTRODUODENOSCOPY (EGD) WITH PROPOFOL N/A 11/11/2018   Procedure: ESOPHAGOGASTRODUODENOSCOPY (EGD) WITH PROPOFOL;  Surgeon: Christena Deem, MD;  Location: [facility] ENDOSCOPY;  Service: Endoscopy;  Laterality: N/A;   EXPLORATORY LAPAROTOMY  1999   KIDNEY STONE SURGERY  04/2016   REVISION UROSTOMY CUTANEOUS     REVISION UROSTOMY CUTANEOUS  01/10/2018   SUPRAPUBIC CATHETER PLACEMENT  08/2017   TONSILLECTOMY     Patient Active Problem List   Diagnosis Date Noted   Seizure (HCC) 11/11/2018   Major depressive disorder, recurrent episode, moderate (HCC) 02/07/2018   Nephrolithiasis 04/16/2016   Numbness 07/28/2015   Bladder retention 06/23/2015    Abdominal pain 06/04/2015   Dizziness 05/18/2015   Neck pain 05/18/2015   Complicated migraine 04/28/2015   Other fatigue 04/28/2015   Abnormal finding on MRI of brain 04/28/2015   D (diarrhea) 03/29/2015   H/O disease 03/29/2015   Abnormal weight loss 03/29/2015   Muscle weakness (generalized) 03/14/2015   Headache, migraine 03/10/2015    PCP: Ermalinda Memos MD  REFERRING PROVIDER: Lacy Duverney MD  REFERRING DIAG: s/p hip surgery, degenerative tear of acetabular labrum   THERAPY DIAG:  Pain in left hip  Difficulty in walking, not elsewhere classified  Muscle weakness (generalized)  Pain in right hip  Rationale for Evaluation and Treatment: Rehabilitation  ONSET DATE: 12/28/21  SUBJECTIVE:   SUBJECTIVE STATEMENT: Patient presents with new onset of back pain. Went to the doctor last week due to red urine. Waiting on results.   PERTINENT HISTORY: Patient is returning to PT s/p L hip surgery 12/28/21.  Patient received HHPT afterwards. 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 Patient is going to get a whole scope of bladder 03/16/22. Patient has been seen prior to surgery by this author for her Hereditary Spastic Paraplegia (HSP). PMH includes bilateral Le edema, BLE weakness, constipation, chronic pain, GERD, tremor, HSP, migraine, neurogenic bladder,seizures, depression, migraine, COVID 19, chronic UTI, urostomy. Patient is on protocol of 50% WB until week 8.  PAIN:  Are you having pain? Yes: NPRS scale: 7/10 Pain location: back Pain description: spasm Aggravating factors: n/a Relieving factors: n/a   PRECAUTIONS: Anterior hip and Fall  WEIGHT BEARING RESTRICTIONS: Yes WBAT  at 8 weeks, 50% WB today.   FALLS:  Has patient fallen in last 6 months? No  LIVING ENVIRONMENT: Lives with: lives with their family Lives in: House/apartment Stairs: Yes: External: 0 steps; ramp Has  following equipment at home: Walker - 4 wheeled and Wheelchair (manual)    PLOF: Independent with basic ADLs  PATIENT GOALS: to be able to ADLs independently.    OBJECTIVE:   DIAGNOSTIC FINDINGS: s/p surgery  PATIENT SURVEYS:  FOTO 34  COGNITION: Overall cognitive status: Within functional limits for tasks assessed     SENSATION: Light touch: Impaired L lateral thigh      POSTURE: No Significant postural limitations  PALPATION: Tender at post surgical site  LOWER EXTREMITY ROM:  Active ROM Right eval Left eval  Hip flexion PROM 70 AROM 35 PROM 65 AROM 5  Hip abduction PROM 25 AROM 23 PROM 35 AROM 4    (Blank rows = not tested)  LOWER EXTREMITY MMT:  MMT Right eval Left eval  Hip flexion 4/5 3+/5  Hip extension    Hip abduction 4-/5 3/5  Hip adduction 3+/5 3/5  Hip internal rotation    Hip external rotation    Knee flexion 4-/5 3+/5 Painful  Knee extension 4-/5 3/5  Ankle dorsiflexion 3+/5 3/5  Ankle plantarflexion 3+/5 3/5  Ankle inversion    Ankle eversion     (Blank rows = not tested)  FUNCTIONAL TESTS:  5 times sit to stand: 43.64  SPT: 50% WB on LLE; Min A for performance  Supine<>sit CGA with min A for LLE   GAIT: Will assess next session when WBAT   TODAY'S TREATMENT:                                                                                                                              DATE: 04/03/22 Manual: Hamstring lengthening stretch 60 seconds each LE Single knee to chest 60 seconds each LE Figure four modified 60 seconds each LE  Grade II spinal mobilizations lumbar spine x 3 minutes STM to lumbar paraspinals x 8 minutes with implementation of effleurage and pettrisage.   TherEx Supine: BTB abduction 15x Bridge 15x; -terminated due to pain Posterior pelvic tilt 10x Modified march with BTB15x each LE  Seated: -forward trunk lean 30 seconds push pull  Standing:  Sit to stand 5x hold 30 seconds   PATIENT  EDUCATION:  Education details: goals, POC  Person educated: Patient Education method: Explanation Education comprehension: verbalized understanding and returned demonstration  HOME EXERCISE PROGRAM: Give next session  ASSESSMENT:  CLINICAL IMPRESSION: Patient presents with new onset of low back pain that is limiting her function and mobility. Patient reports some reduction of pain by end of session however some pain may  be quadrant pain, awaiting physician information. Patient has increased buckling of limbs in standing.  Patient will benefit from skilled physical therapy to increase functional mobility, strength, and quality of life.   OBJECTIVE IMPAIRMENTS: Abnormal gait, decreased activity tolerance, decreased balance, decreased mobility, difficulty walking, decreased ROM, decreased strength, increased fascial restrictions, impaired perceived functional ability, impaired flexibility, impaired sensation, improper body mechanics, and pain.   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 PMH includes bilateral Le edema, BLE weakness, constipation, chronic pain, GERD, tremor, HSP, migraine, neurogenic bladder,seizures, depression, migraine, COVID 19, chronic UTI, urostomy  are also affecting patient's functional outcome.   REHAB POTENTIAL: Good  CLINICAL DECISION MAKING: Evolving/moderate complexity  EVALUATION COMPLEXITY: Moderate   GOALS: Goals reviewed with patient? Yes  SHORT  TERM GOALS: Target date: 03/20/2022  Patient will be independent in home exercise program to improve strength/mobility for better functional independence with ADLs. Baseline: 02/20/22; give next session Goal status: INITIAL    LONG TERM GOALS: Target date: 05/15/2022   Patient will increase FOTO score to equal to or greater than  49%   to demonstrate statistically significant improvement in mobility and quality of life.  Baseline: 11/6: 34% Goal status: INITIAL  2.   Patient (< 36 years old) will complete five times sit to stand test in < 10 seconds indicating an increased LE strength and improved balance. Baseline: 11/6: perform next session 11/8: 43.64 with BUE support Goal status: INITIAL  3.  Patient will increase 10 meter walk test to less than 30 seconds as to improve gait speed for better community ambulation and to reduce fall risk. Baseline: 11/6: perform next session; 11/8: unable to perform full length: did 20.2 ft in 2 min 21 seconds with rollator Goal status: INITIAL  4.  Patient will increase BLE gross strength to 4+/5 as to improve functional strength for independent gait, increased standing tolerance and increased ADL ability. Baseline: 11/6: see above Goal status: INITIAL     PLAN:  PT FREQUENCY: 2x/week  PT DURATION: 12 weeks  PLANNED INTERVENTIONS: Therapeutic exercises, Therapeutic activity, Neuromuscular re-education, Balance training, Gait training, Patient/Family education, Joint mobilization, Stair training, Vestibular training, Canalith repositioning, DME instructions, Dry Needling, Cognitive remediation, Electrical stimulation, Wheelchair mobility training, Spinal mobilization, Cryotherapy, Moist heat, Taping, Vasopneumatic device, Traction, Ultrasound, Ionotophoresis 4mg /ml Dexamethasone, Manual therapy, and Re-evaluation  PLAN FOR NEXT SESSION: 5x STS, 10 MWT   , PT 04/03/2022, 12:30 PM

## 2022-04-03 ENCOUNTER — Ambulatory Visit: Payer: BC Managed Care – PPO

## 2022-04-03 DIAGNOSIS — M25552 Pain in left hip: Secondary | ICD-10-CM

## 2022-04-03 DIAGNOSIS — M25551 Pain in right hip: Secondary | ICD-10-CM

## 2022-04-03 DIAGNOSIS — R262 Difficulty in walking, not elsewhere classified: Secondary | ICD-10-CM

## 2022-04-03 DIAGNOSIS — M6281 Muscle weakness (generalized): Secondary | ICD-10-CM

## 2022-04-05 ENCOUNTER — Ambulatory Visit: Payer: BC Managed Care – PPO

## 2022-04-06 ENCOUNTER — Encounter: Payer: BC Managed Care – PPO | Admitting: Physical Therapy

## 2022-04-11 NOTE — Therapy (Incomplete)
OUTPATIENT PHYSICAL THERAPY LOWER EXTREMITY TREATMENT   Patient Name: Stormey Wilborn MRN: [id number] DOB:1980/06/10, 41 y.o., female Today's Date: 04/11/2022         Past Medical History:  Diagnosis Date   Complication of anesthesia    ? seizures after anesthesia    Headache    Migraines    Neurogenic bladder    Renal disorder    Vision abnormalities    Past Surgical History:  Procedure Laterality Date   ANTERIOR CRUCIATE LIGAMENT REPAIR  1997   APPENDECTOMY     COLONOSCOPY WITH PROPOFOL N/A 11/11/2018   Procedure: COLONOSCOPY WITH PROPOFOL;  Surgeon: Christena Deem, MD;  Location: [facility] ENDOSCOPY;  Service: Endoscopy;  Laterality: N/A;   CYSTOSCOPY WITH STENT PLACEMENT Right 04/17/2016   Procedure: CYSTOSCOPY WITH STENT PLACEMENT;  Surgeon: Malen Gauze, MD;  Location: [facility] ORS;  Service: Urology;  Laterality: Right;   ESOPHAGOGASTRODUODENOSCOPY (EGD) WITH PROPOFOL N/A 11/11/2018   Procedure: ESOPHAGOGASTRODUODENOSCOPY (EGD) WITH PROPOFOL;  Surgeon: Christena Deem, MD;  Location: [facility] ENDOSCOPY;  Service: Endoscopy;  Laterality: N/A;   EXPLORATORY LAPAROTOMY  1999   KIDNEY STONE SURGERY  04/2016   REVISION UROSTOMY CUTANEOUS     REVISION UROSTOMY CUTANEOUS  01/10/2018   SUPRAPUBIC CATHETER PLACEMENT  08/2017   TONSILLECTOMY     Patient Active Problem List   Diagnosis Date Noted   Seizure (HCC) 11/11/2018   Major depressive disorder, recurrent episode, moderate (HCC) 02/07/2018   Nephrolithiasis 04/16/2016   Numbness 07/28/2015   Bladder retention 06/23/2015   Abdominal pain 06/04/2015   Dizziness 05/18/2015   Neck pain 05/18/2015   Complicated migraine 04/28/2015   Other fatigue 04/28/2015   Abnormal finding on MRI of brain 04/28/2015   D (diarrhea) 03/29/2015   H/O disease 03/29/2015   Abnormal weight loss 03/29/2015   Muscle weakness (generalized) 03/14/2015   Headache, migraine 03/10/2015    PCP: Ermalinda Memos MD  REFERRING  PROVIDER: Lacy Duverney MD  REFERRING DIAG: s/p hip surgery, degenerative tear of acetabular labrum   THERAPY DIAG:  No diagnosis found.  Rationale for Evaluation and Treatment: Rehabilitation  ONSET DATE: 12/28/21  SUBJECTIVE:   SUBJECTIVE STATEMENT: ***  PERTINENT HISTORY: Patient is returning to PT s/p L hip surgery 12/28/21.  Patient received HHPT afterwards. 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 Patient is going to get a whole scope of bladder 03/16/22. Patient has been seen prior to surgery by this author for her Hereditary Spastic Paraplegia (HSP). PMH includes bilateral Le edema, BLE weakness, constipation, chronic pain, GERD, tremor, HSP, migraine, neurogenic bladder,seizures, depression, migraine, COVID 19, chronic UTI, urostomy. Patient is on protocol of 50% WB until week 8.  PAIN:  Are you having pain? Yes: NPRS scale: 7/10 Pain location: back Pain description: spasm Aggravating factors: n/a Relieving factors: n/a   PRECAUTIONS: Anterior hip and Fall  WEIGHT BEARING RESTRICTIONS: Yes WBAT  at 8 weeks, 50% WB today.   FALLS:  Has patient fallen in last 6 months? No  LIVING ENVIRONMENT: Lives with: lives with their family Lives in: House/apartment Stairs: Yes: External: 0 steps; ramp Has following equipment at home: Walker - 4 wheeled and Wheelchair (manual)    PLOF: Independent with basic ADLs  PATIENT GOALS: to be able to ADLs independently.    OBJECTIVE:   DIAGNOSTIC FINDINGS: s/p surgery  PATIENT SURVEYS:  FOTO 34  COGNITION: Overall cognitive status: Within functional limits for tasks assessed  SENSATION: Light touch: Impaired L lateral thigh      POSTURE: No Significant postural limitations  PALPATION: Tender at post surgical site  LOWER EXTREMITY ROM:  Active ROM Right eval Left eval  Hip flexion PROM 70 AROM 35 PROM 65 AROM 5  Hip abduction PROM  25 AROM 23 PROM 35 AROM 4    (Blank rows = not tested)  LOWER EXTREMITY MMT:  MMT Right eval Left eval  Hip flexion 4/5 3+/5  Hip extension    Hip abduction 4-/5 3/5  Hip adduction 3+/5 3/5  Hip internal rotation    Hip external rotation    Knee flexion 4-/5 3+/5 Painful  Knee extension 4-/5 3/5  Ankle dorsiflexion 3+/5 3/5  Ankle plantarflexion 3+/5 3/5  Ankle inversion    Ankle eversion     (Blank rows = not tested)  FUNCTIONAL TESTS:  5 times sit to stand: 43.64  SPT: 50% WB on LLE; Min A for performance  Supine<>sit CGA with min A for LLE   GAIT: Will assess next session when WBAT   TODAY'S TREATMENT:                                                                                                                              DATE: 04/11/22 Manual: Hamstring lengthening stretch 60 seconds each LE Single knee to chest 60 seconds each LE Figure four modified 60 seconds each LE  Grade II spinal mobilizations lumbar spine x 3 minutes STM to lumbar paraspinals x 8 minutes with implementation of effleurage and pettrisage.   TherEx Supine: BTB abduction 15x Bridge 15x; -terminated due to pain Posterior pelvic tilt 10x Modified march with BTB15x each LE  Seated: -forward trunk lean 30 seconds push pull  Standing:  Sit to stand 5x hold 30 seconds   PATIENT EDUCATION:  Education details: goals, POC  Person educated: Patient Education method: Explanation Education comprehension: verbalized understanding and returned demonstration  HOME EXERCISE PROGRAM: Give next session  ASSESSMENT:  CLINICAL IMPRESSION: *** Patient will benefit from skilled physical therapy to increase functional mobility, strength, and quality of life.   OBJECTIVE IMPAIRMENTS: Abnormal gait, decreased activity tolerance, decreased balance, decreased mobility, difficulty walking, decreased ROM, decreased strength, increased fascial restrictions, impaired perceived functional ability,  impaired flexibility, impaired sensation, improper body mechanics, and pain.   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 PMH includes bilateral Le edema, BLE weakness, constipation, chronic pain, GERD, tremor, HSP, migraine, neurogenic bladder,seizures, depression, migraine, COVID 19, chronic UTI, urostomy  are also affecting patient's functional outcome.   REHAB POTENTIAL: Good  CLINICAL DECISION MAKING: Evolving/moderate complexity  EVALUATION COMPLEXITY: Moderate  GOALS: Goals reviewed with patient? Yes  SHORT TERM GOALS: Target date: 03/20/2022  Patient will be independent in home exercise program to improve strength/mobility for better functional independence with ADLs. Baseline: 02/20/22; give next session Goal status: INITIAL    LONG TERM GOALS: Target date: 05/15/2022   Patient will increase FOTO score to equal to or greater than  49%   to demonstrate statistically significant improvement in mobility and quality of life.  Baseline: 11/6: 34% Goal status: INITIAL  2.   Patient (< 24 years old) will complete five times sit to stand test in < 10 seconds indicating an increased LE strength and improved balance. Baseline: 11/6: perform next session 11/8: 43.64 with BUE support Goal status: INITIAL  3.  Patient will increase 10 meter walk test to less than 30 seconds as to improve gait speed for better community ambulation and to reduce fall risk. Baseline: 11/6: perform  next session; 11/8: unable to perform full length: did 20.2 ft in 2 min 21 seconds with rollator Goal status: INITIAL  4.  Patient will increase BLE gross strength to 4+/5 as to improve functional strength for independent gait, increased standing tolerance and increased ADL ability. Baseline: 11/6: see above Goal status: INITIAL     PLAN:  PT FREQUENCY: 2x/week  PT DURATION: 12 weeks  PLANNED INTERVENTIONS: Therapeutic exercises, Therapeutic activity, Neuromuscular re-education, Balance training, Gait training, Patient/Family education, Joint mobilization, Stair training, Vestibular training, Canalith repositioning, DME instructions, Dry Needling, Cognitive remediation, Electrical stimulation, Wheelchair mobility training, Spinal mobilization, Cryotherapy, Moist heat, Taping, Vasopneumatic device, Traction, Ultrasound, Ionotophoresis 4mg /ml Dexamethasone, Manual therapy, and Re-evaluation  PLAN FOR NEXT SESSION: 5x STS, 10 MWT   Janna Arch, PT 04/11/2022, 12:40 PM

## 2022-04-12 ENCOUNTER — Ambulatory Visit: Payer: BC Managed Care – PPO

## 2022-04-18 NOTE — Therapy (Incomplete)
OUTPATIENT PHYSICAL THERAPY LOWER EXTREMITY TREATMENT   Patient Name: Christine Chavez MRN: [id number] DOB:[birth year], 42 y.o., female Today's Date: 04/18/2022         Past Medical History:  Diagnosis Date   Complication of anesthesia    ? seizures after anesthesia    Headache    Migraines    Neurogenic bladder    Renal disorder    Vision abnormalities    Past Surgical History:  Procedure Laterality Date   ANTERIOR CRUCIATE LIGAMENT REPAIR  1997   APPENDECTOMY     COLONOSCOPY WITH PROPOFOL N/A 11/11/2018   Procedure: COLONOSCOPY WITH PROPOFOL;  Surgeon: Christena Deem, MD;  Location: [facility] ENDOSCOPY;  Service: Endoscopy;  Laterality: N/A;   CYSTOSCOPY WITH STENT PLACEMENT Right 04/17/2016   Procedure: CYSTOSCOPY WITH STENT PLACEMENT;  Surgeon: Malen Gauze, MD;  Location: [facility] ORS;  Service: Urology;  Laterality: Right;   ESOPHAGOGASTRODUODENOSCOPY (EGD) WITH PROPOFOL N/A 11/11/2018   Procedure: ESOPHAGOGASTRODUODENOSCOPY (EGD) WITH PROPOFOL;  Surgeon: Christena Deem, MD;  Location: [facility] ENDOSCOPY;  Service: Endoscopy;  Laterality: N/A;   EXPLORATORY LAPAROTOMY  1999   KIDNEY STONE SURGERY  04/2016   REVISION UROSTOMY CUTANEOUS     REVISION UROSTOMY CUTANEOUS  01/10/2018   SUPRAPUBIC CATHETER PLACEMENT  08/2017   TONSILLECTOMY     Patient Active Problem List   Diagnosis Date Noted   Seizure (HCC) 11/11/2018   Major depressive disorder, recurrent episode, moderate (HCC) 02/07/2018   Nephrolithiasis 04/16/2016   Numbness 07/28/2015   Bladder retention 06/23/2015   Abdominal pain 06/04/2015   Dizziness 05/18/2015   Neck pain 05/18/2015   Complicated migraine 04/28/2015   Other fatigue 04/28/2015   Abnormal finding on MRI of brain 04/28/2015   D (diarrhea) 03/29/2015   H/O disease 03/29/2015   Abnormal weight loss 03/29/2015   Muscle weakness (generalized) 03/14/2015   Headache, migraine 03/10/2015    PCP: Ermalinda Memos MD  REFERRING  PROVIDER: Lacy Duverney MD  REFERRING DIAG: s/p hip surgery, degenerative tear of acetabular labrum   THERAPY DIAG:  No diagnosis found.  Rationale for Evaluation and Treatment: Rehabilitation  ONSET DATE: 12/28/21  SUBJECTIVE:   SUBJECTIVE STATEMENT: ***  PERTINENT HISTORY: Patient is returning to PT s/p L hip surgery 12/28/21.  Patient received HHPT afterwards. 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 Patient is going to get a whole scope of bladder 03/16/22. Patient has been seen prior to surgery by this author for her Hereditary Spastic Paraplegia (HSP). PMH includes bilateral Le edema, BLE weakness, constipation, chronic pain, GERD, tremor, HSP, migraine, neurogenic bladder,seizures, depression, migraine, COVID 19, chronic UTI, urostomy. Patient is on protocol of 50% WB until week 8.  PAIN:  Are you having pain? Yes: NPRS scale: 7/10 Pain location: back Pain description: spasm Aggravating factors: n/a Relieving factors: n/a   PRECAUTIONS: Anterior hip and Fall  WEIGHT BEARING RESTRICTIONS: Yes WBAT  at 8 weeks, 50% WB today.   FALLS:  Has patient fallen in last 6 months? No  LIVING ENVIRONMENT: Lives with: lives with their family Lives in: House/apartment Stairs: Yes: External: 0 steps; ramp Has following equipment at home: Walker - 4 wheeled and Wheelchair (manual)    PLOF: Independent with basic ADLs  PATIENT GOALS: to be able to ADLs independently.    OBJECTIVE:   DIAGNOSTIC FINDINGS: s/p surgery  PATIENT SURVEYS:  FOTO 34  COGNITION: Overall cognitive status: Within functional limits for tasks assessed  SENSATION: Light touch: Impaired L lateral thigh      POSTURE: No Significant postural limitations  PALPATION: Tender at post surgical site  LOWER EXTREMITY ROM:  Active ROM Right eval Left eval  Hip flexion PROM 70 AROM 35 PROM 65 AROM 5  Hip abduction PROM  25 AROM 23 PROM 35 AROM 4    (Blank rows = not tested)  LOWER EXTREMITY MMT:  MMT Right eval Left eval  Hip flexion 4/5 3+/5  Hip extension    Hip abduction 4-/5 3/5  Hip adduction 3+/5 3/5  Hip internal rotation    Hip external rotation    Knee flexion 4-/5 3+/5 Painful  Knee extension 4-/5 3/5  Ankle dorsiflexion 3+/5 3/5  Ankle plantarflexion 3+/5 3/5  Ankle inversion    Ankle eversion     (Blank rows = not tested)  FUNCTIONAL TESTS:  5 times sit to stand: 43.64  SPT: 50% WB on LLE; Min A for performance  Supine<>sit CGA with min A for LLE   GAIT: Will assess next session when WBAT   TODAY'S TREATMENT:                                                                                                                              DATE: 04/18/22 Manual: Hamstring lengthening stretch 60 seconds each LE Single knee to chest 60 seconds each LE Figure four modified 60 seconds each LE  Grade II spinal mobilizations lumbar spine x 3 minutes STM to lumbar paraspinals x 8 minutes with implementation of effleurage and pettrisage.   TherEx Supine: BTB abduction 15x Bridge 15x; -terminated due to pain Posterior pelvic tilt 10x Modified march with BTB15x each LE  Seated: -forward trunk lean 30 seconds push pull  Standing:  Sit to stand 5x hold 30 seconds   PATIENT EDUCATION:  Education details: goals, POC  Person educated: Patient Education method: Explanation Education comprehension: verbalized understanding and returned demonstration  HOME EXERCISE PROGRAM: Give next session  ASSESSMENT:  CLINICAL IMPRESSION: *** Patient will benefit from skilled physical therapy to increase functional mobility, strength, and quality of life.   OBJECTIVE IMPAIRMENTS: Abnormal gait, decreased activity tolerance, decreased balance, decreased mobility, difficulty walking, decreased ROM, decreased strength, increased fascial restrictions, impaired perceived functional ability,  impaired flexibility, impaired sensation, improper body mechanics, and pain.   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 PMH includes bilateral Le edema, BLE weakness, constipation, chronic pain, GERD, tremor, HSP, migraine, neurogenic bladder,seizures, depression, migraine, COVID 19, chronic UTI, urostomy  are also affecting patient's functional outcome.   REHAB POTENTIAL: Good  CLINICAL DECISION MAKING: Evolving/moderate complexity  EVALUATION COMPLEXITY: Moderate  GOALS: Goals reviewed with patient? Yes  SHORT TERM GOALS: Target date: 03/20/2022  Patient will be independent in home exercise program to improve strength/mobility for better functional independence with ADLs. Baseline: 02/20/22; give next session Goal status: INITIAL    LONG TERM GOALS: Target date: 05/15/2022   Patient will increase FOTO score to equal to or greater than  49%   to demonstrate statistically significant improvement in mobility and quality of life.  Baseline: 11/6: 34% Goal status: INITIAL  2.   Patient (< 74 years old) will complete five times sit to stand test in < 10 seconds indicating an increased LE strength and improved balance. Baseline: 11/6: perform next session 11/8: 43.64 with BUE support Goal status: INITIAL  3.  Patient will increase 10 meter walk test to less than 30 seconds as to improve gait speed for better community ambulation and to reduce fall risk. Baseline: 11/6: perform  next session; 11/8: unable to perform full length: did 20.2 ft in 2 min 21 seconds with rollator Goal status: INITIAL  4.  Patient will increase BLE gross strength to 4+/5 as to improve functional strength for independent gait, increased standing tolerance and increased ADL ability. Baseline: 11/6: see above Goal status: INITIAL     PLAN:  PT FREQUENCY: 2x/week  PT DURATION: 12 weeks  PLANNED INTERVENTIONS: Therapeutic exercises, Therapeutic activity, Neuromuscular re-education, Balance training, Gait training, Patient/Family education, Joint mobilization, Stair training, Vestibular training, Canalith repositioning, DME instructions, Dry Needling, Cognitive remediation, Electrical stimulation, Wheelchair mobility training, Spinal mobilization, Cryotherapy, Moist heat, Taping, Vasopneumatic device, Traction, Ultrasound, Ionotophoresis 4mg /ml Dexamethasone, Manual therapy, and Re-evaluation  PLAN FOR NEXT SESSION: 5x STS, 10 MWT   Janna Arch, PT 04/18/2022, 4:43 PM

## 2022-04-19 ENCOUNTER — Ambulatory Visit: Payer: BC Managed Care – PPO

## 2022-04-20 NOTE — Therapy (Incomplete)
OUTPATIENT PHYSICAL THERAPY LOWER EXTREMITY TREATMENT   Patient Name: Christine Chavez MRN: [id number] DOB:Nov 09, 1980, 42 y.o., female Today's Date: 04/20/2022         Past Medical History:  Diagnosis Date   Complication of anesthesia    ? seizures after anesthesia    Headache    Migraines    Neurogenic bladder    Renal disorder    Vision abnormalities    Past Surgical History:  Procedure Laterality Date   ANTERIOR CRUCIATE LIGAMENT REPAIR  1997   APPENDECTOMY     COLONOSCOPY WITH PROPOFOL N/A 11/11/2018   Procedure: COLONOSCOPY WITH PROPOFOL;  Surgeon: [name], MD;  Location: [facility] ENDOSCOPY;  Service: Endoscopy;  Laterality: N/A;   CYSTOSCOPY WITH STENT PLACEMENT Right 04/17/2016   Procedure: CYSTOSCOPY WITH STENT PLACEMENT;  Surgeon: [name], MD;  Location: [facility] ORS;  Service: Urology;  Laterality: Right;   ESOPHAGOGASTRODUODENOSCOPY (EGD) WITH PROPOFOL N/A 11/11/2018   Procedure: ESOPHAGOGASTRODUODENOSCOPY (EGD) WITH PROPOFOL;  Surgeon: [name], MD;  Location: [facility] ENDOSCOPY;  Service: Endoscopy;  Laterality: N/A;   EXPLORATORY LAPAROTOMY  1999   KIDNEY STONE SURGERY  04/2016   REVISION UROSTOMY CUTANEOUS     REVISION UROSTOMY CUTANEOUS  01/10/2018   SUPRAPUBIC CATHETER PLACEMENT  08/2017   TONSILLECTOMY     Patient Active Problem List   Diagnosis Date Noted   Seizure ([place]) 11/11/2018   Major depressive disorder, recurrent episode, moderate ([place]) 02/07/2018   Nephrolithiasis 04/16/2016   Numbness 07/28/2015   Bladder retention 06/23/2015   Abdominal pain 06/04/2015   Dizziness 05/18/2015   Neck pain [id number]   Complicated migraine [id number]   Other fatigue 04/28/2015   Abnormal finding on MRI of brain 04/28/2015   D (diarrhea) 03/29/2015   H/O disease 03/29/2015   Abnormal weight loss 03/29/2015   Muscle weakness (generalized) 03/14/2015   Headache, migraine 03/10/2015    PCP: Elza Rafter MD  REFERRING  PROVIDER: Genia Del MD  REFERRING DIAG: s/p hip surgery, degenerative tear of acetabular labrum   THERAPY DIAG:  No diagnosis found.  Rationale for Evaluation and Treatment: Rehabilitation  ONSET DATE: 12/28/21  SUBJECTIVE:   SUBJECTIVE STATEMENT: ***  PERTINENT HISTORY: Patient is returning to PT s/p L hip surgery 12/28/21.  Patient received HHPT afterwards. 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 Patient is going to get a whole scope of bladder 03/16/22. Patient has been seen prior to surgery by this author for her Hereditary Spastic Paraplegia (HSP). PMH includes bilateral Le edema, BLE weakness, constipation, chronic pain, GERD, tremor, HSP, migraine, neurogenic bladder,seizures, depression, migraine, COVID 19, chronic UTI, urostomy. Patient is on protocol of 50% WB until week 8.  PAIN:  Are you having pain? Yes: NPRS scale: 7/10 Pain location: back Pain description: spasm Aggravating factors: n/a Relieving factors: n/a   PRECAUTIONS: Anterior hip and Fall  WEIGHT BEARING RESTRICTIONS: Yes WBAT  at 8 weeks, 50% WB today.   FALLS:  Has patient fallen in last 6 months? No  LIVING ENVIRONMENT: Lives with: lives with their family Lives in: House/apartment Stairs: Yes: External: 0 steps; ramp Has following equipment at home: Walker - 4 wheeled and Wheelchair (manual)    PLOF: Independent with basic ADLs  PATIENT GOALS: to be able to ADLs independently.    OBJECTIVE:   DIAGNOSTIC FINDINGS: s/p surgery  PATIENT SURVEYS:  FOTO 34  COGNITION: Overall cognitive status: Within functional limits for tasks assessed  SENSATION: Light touch: Impaired L lateral thigh      POSTURE: No Significant postural limitations  PALPATION: Tender at post surgical site  LOWER EXTREMITY ROM:  Active ROM Right eval Left eval  Hip flexion PROM 70 AROM 35 PROM 65 AROM 5  Hip abduction PROM  25 AROM 23 PROM 35 AROM 4    (Blank rows = not tested)  LOWER EXTREMITY MMT:  MMT Right eval Left eval  Hip flexion 4/5 3+/5  Hip extension    Hip abduction 4-/5 3/5  Hip adduction 3+/5 3/5  Hip internal rotation    Hip external rotation    Knee flexion 4-/5 3+/5 Painful  Knee extension 4-/5 3/5  Ankle dorsiflexion 3+/5 3/5  Ankle plantarflexion 3+/5 3/5  Ankle inversion    Ankle eversion     (Blank rows = not tested)  FUNCTIONAL TESTS:  5 times sit to stand: 43.64  SPT: 50% WB on LLE; Min A for performance  Supine<>sit CGA with min A for LLE   GAIT: Will assess next session when WBAT   TODAY'S TREATMENT:                                                                                                                              DATE: 04/20/22 Manual: Hamstring lengthening stretch 60 seconds each LE Single knee to chest 60 seconds each LE Figure four modified 60 seconds each LE  Grade II spinal mobilizations lumbar spine x 3 minutes STM to lumbar paraspinals x 8 minutes with implementation of effleurage and pettrisage.   TherEx Supine: BTB abduction 15x Bridge 15x; -terminated due to pain Posterior pelvic tilt 10x Modified march with BTB15x each LE  Seated: -forward trunk lean 30 seconds push pull  Standing:  Sit to stand 5x hold 30 seconds   PATIENT EDUCATION:  Education details: goals, POC  Person educated: Patient Education method: Explanation Education comprehension: verbalized understanding and returned demonstration  HOME EXERCISE PROGRAM: Give next session  ASSESSMENT:  CLINICAL IMPRESSION: *** Patient will benefit from skilled physical therapy to increase functional mobility, strength, and quality of life.   OBJECTIVE IMPAIRMENTS: Abnormal gait, decreased activity tolerance, decreased balance, decreased mobility, difficulty walking, decreased ROM, decreased strength, increased fascial restrictions, impaired perceived functional ability,  impaired flexibility, impaired sensation, improper body mechanics, and pain.   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 PMH includes bilateral Le edema, BLE weakness, constipation, chronic pain, GERD, tremor, HSP, migraine, neurogenic bladder,seizures, depression, migraine, COVID 19, chronic UTI, urostomy  are also affecting patient's functional outcome.   REHAB POTENTIAL: Good  CLINICAL DECISION MAKING: Evolving/moderate complexity  EVALUATION COMPLEXITY: Moderate  GOALS: Goals reviewed with patient? Yes  SHORT TERM GOALS: Target date: 03/20/2022  Patient will be independent in home exercise program to improve strength/mobility for better functional independence with ADLs. Baseline: 02/20/22; give next session Goal status: INITIAL    LONG TERM GOALS: Target date: 05/15/2022   Patient will increase FOTO score to equal to or greater than  49%   to demonstrate statistically significant improvement in mobility and quality of life.  Baseline: 11/6: 34% Goal status: INITIAL  2.   Patient (< 59 years old) will complete five times sit to stand test in < 10 seconds indicating an increased LE strength and improved balance. Baseline: 11/6: perform next session 11/8: 43.64 with BUE support Goal status: INITIAL  3.  Patient will increase 10 meter walk test to less than 30 seconds as to improve gait speed for better community ambulation and to reduce fall risk. Baseline: 11/6: perform  next session; 11/8: unable to perform full length: did 20.2 ft in 2 min 21 seconds with rollator Goal status: INITIAL  4.  Patient will increase BLE gross strength to 4+/5 as to improve functional strength for independent gait, increased standing tolerance and increased ADL ability. Baseline: 11/6: see above Goal status: INITIAL     PLAN:  PT FREQUENCY: 2x/week  PT DURATION: 12 weeks  PLANNED INTERVENTIONS: Therapeutic exercises, Therapeutic activity, Neuromuscular re-education, Balance training, Gait training, Patient/Family education, Joint mobilization, Stair training, Vestibular training, Canalith repositioning, DME instructions, Dry Needling, Cognitive remediation, Electrical stimulation, Wheelchair mobility training, Spinal mobilization, Cryotherapy, Moist heat, Taping, Vasopneumatic device, Traction, Ultrasound, Ionotophoresis 4mg /ml Dexamethasone, Manual therapy, and Re-evaluation  PLAN FOR NEXT SESSION: 5x STS, 10 MWT   Janna Arch, PT 04/20/2022, 4:46 PM

## 2022-04-24 ENCOUNTER — Ambulatory Visit: Payer: BC Managed Care – PPO

## 2022-04-25 NOTE — Therapy (Signed)
OUTPATIENT PHYSICAL THERAPY LOWER EXTREMITY TREATMENT   Patient Name: Christine Chavez MRN: [id number] DOB:Apr 05, 1981, 42 y.o., female Today's Date: 04/25/2022         Past Medical History:  Diagnosis Date   Complication of anesthesia    ? seizures after anesthesia    Headache    Migraines    Neurogenic bladder    Renal disorder    Vision abnormalities    Past Surgical History:  Procedure Laterality Date   ANTERIOR CRUCIATE LIGAMENT REPAIR  1997   APPENDECTOMY     COLONOSCOPY WITH PROPOFOL N/A 11/11/2018   Procedure: COLONOSCOPY WITH PROPOFOL;  Surgeon: Christena Deem, MD;  Location: [facility] ENDOSCOPY;  Service: Endoscopy;  Laterality: N/A;   CYSTOSCOPY WITH STENT PLACEMENT Right 04/17/2016   Procedure: CYSTOSCOPY WITH STENT PLACEMENT;  Surgeon: Malen Gauze, MD;  Location: [facility] ORS;  Service: Urology;  Laterality: Right;   ESOPHAGOGASTRODUODENOSCOPY (EGD) WITH PROPOFOL N/A 11/11/2018   Procedure: ESOPHAGOGASTRODUODENOSCOPY (EGD) WITH PROPOFOL;  Surgeon: Christena Deem, MD;  Location: [facility] ENDOSCOPY;  Service: Endoscopy;  Laterality: N/A;   EXPLORATORY LAPAROTOMY  1999   KIDNEY STONE SURGERY  04/2016   REVISION UROSTOMY CUTANEOUS     REVISION UROSTOMY CUTANEOUS  01/10/2018   SUPRAPUBIC CATHETER PLACEMENT  08/2017   TONSILLECTOMY     Patient Active Problem List   Diagnosis Date Noted   Seizure (HCC) 11/11/2018   Major depressive disorder, recurrent episode, moderate (HCC) 02/07/2018   Nephrolithiasis 04/16/2016   Numbness 07/28/2015   Bladder retention 06/23/2015   Abdominal pain 06/04/2015   Dizziness 05/18/2015   Neck pain 05/18/2015   Complicated migraine 04/28/2015   Other fatigue 04/28/2015   Abnormal finding on MRI of brain 04/28/2015   D (diarrhea) 03/29/2015   H/O disease 03/29/2015   Abnormal weight loss 03/29/2015   Muscle weakness (generalized) 03/14/2015   Headache, migraine 03/10/2015    PCP: Ermalinda Memos MD  REFERRING  PROVIDER: Lacy Duverney MD  REFERRING DIAG: s/p hip surgery, degenerative tear of acetabular labrum   THERAPY DIAG:  No diagnosis found.  Rationale for Evaluation and Treatment: Rehabilitation  ONSET DATE: 12/28/21  SUBJECTIVE:   SUBJECTIVE STATEMENT: ***  PERTINENT HISTORY: Patient is returning to PT s/p L hip surgery 12/28/21.  Patient received HHPT afterwards. 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 Patient is going to get a whole scope of bladder 03/16/22. Patient has been seen prior to surgery by this author for her Hereditary Spastic Paraplegia (HSP). PMH includes bilateral Le edema, BLE weakness, constipation, chronic pain, GERD, tremor, HSP, migraine, neurogenic bladder,seizures, depression, migraine, COVID 19, chronic UTI, urostomy. Patient is on protocol of 50% WB until week 8.  PAIN:  Are you having pain? Yes: NPRS scale: 7/10 Pain location: back Pain description: spasm Aggravating factors: n/a Relieving factors: n/a   PRECAUTIONS: Anterior hip and Fall  WEIGHT BEARING RESTRICTIONS: Yes WBAT  at 8 weeks, 50% WB today.   FALLS:  Has patient fallen in last 6 months? No  LIVING ENVIRONMENT: Lives with: lives with their family Lives in: House/apartment Stairs: Yes: External: 0 steps; ramp Has following equipment at home: Walker - 4 wheeled and Wheelchair (manual)    PLOF: Independent with basic ADLs  PATIENT GOALS: to be able to ADLs independently.    OBJECTIVE:   DIAGNOSTIC FINDINGS: s/p surgery  PATIENT SURVEYS:  FOTO 34  COGNITION: Overall cognitive status: Within functional limits for tasks assessed  SENSATION: Light touch: Impaired L lateral thigh      POSTURE: No Significant postural limitations  PALPATION: Tender at post surgical site  LOWER EXTREMITY ROM:  Active ROM Right eval Left eval  Hip flexion PROM 70 AROM 35 PROM 65 AROM 5  Hip abduction PROM  25 AROM 23 PROM 35 AROM 4    (Blank rows = not tested)  LOWER EXTREMITY MMT:  MMT Right eval Left eval  Hip flexion 4/5 3+/5  Hip extension    Hip abduction 4-/5 3/5  Hip adduction 3+/5 3/5  Hip internal rotation    Hip external rotation    Knee flexion 4-/5 3+/5 Painful  Knee extension 4-/5 3/5  Ankle dorsiflexion 3+/5 3/5  Ankle plantarflexion 3+/5 3/5  Ankle inversion    Ankle eversion     (Blank rows = not tested)  FUNCTIONAL TESTS:  5 times sit to stand: 43.64  SPT: 50% WB on LLE; Min A for performance  Supine<>sit CGA with min A for LLE   GAIT: Will assess next session when WBAT   TODAY'S TREATMENT:                                                                                                                              DATE: 04/25/22 Manual: Hamstring lengthening stretch 60 seconds each LE Single knee to chest 60 seconds each LE Figure four modified 60 seconds each LE  Grade II spinal mobilizations lumbar spine x 3 minutes STM to lumbar paraspinals x 8 minutes with implementation of effleurage and pettrisage.   TherEx Supine: BTB abduction 15x Bridge 15x; -terminated due to pain Posterior pelvic tilt 10x Modified march with BTB15x each LE  Seated: -forward trunk lean 30 seconds push pull  Standing:  Sit to stand 5x hold 30 seconds   PATIENT EDUCATION:  Education details: goals, POC  Person educated: Patient Education method: Explanation Education comprehension: verbalized understanding and returned demonstration  HOME EXERCISE PROGRAM: Give next session  ASSESSMENT:  CLINICAL IMPRESSION: *** Patient will benefit from skilled physical therapy to increase functional mobility, strength, and quality of life.   OBJECTIVE IMPAIRMENTS: Abnormal gait, decreased activity tolerance, decreased balance, decreased mobility, difficulty walking, decreased ROM, decreased strength, increased fascial restrictions, impaired perceived functional ability,  impaired flexibility, impaired sensation, improper body mechanics, and pain.   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 PMH includes bilateral Le edema, BLE weakness, constipation, chronic pain, GERD, tremor, HSP, migraine, neurogenic bladder,seizures, depression, migraine, COVID 19, chronic UTI, urostomy  are also affecting patient's functional outcome.   REHAB POTENTIAL: Good  CLINICAL DECISION MAKING: Evolving/moderate complexity  EVALUATION COMPLEXITY: Moderate  GOALS: Goals reviewed with patient? Yes  SHORT TERM GOALS: Target date: 03/20/2022  Patient will be independent in home exercise program to improve strength/mobility for better functional independence with ADLs. Baseline: 02/20/22; give next session Goal status: INITIAL    LONG TERM GOALS: Target date: 05/15/2022   Patient will increase FOTO score to equal to or greater than  49%   to demonstrate statistically significant improvement in mobility and quality of life.  Baseline: 11/6: 34% Goal status: INITIAL  2.   Patient (< 40 years old) will complete five times sit to stand test in < 10 seconds indicating an increased LE strength and improved balance. Baseline: 11/6: perform next session 11/8: 43.64 with BUE support Goal status: INITIAL  3.  Patient will increase 10 meter walk test to less than 30 seconds as to improve gait speed for better community ambulation and to reduce fall risk. Baseline: 11/6: perform  next session; 11/8: unable to perform full length: did 20.2 ft in 2 min 21 seconds with rollator Goal status: INITIAL  4.  Patient will increase BLE gross strength to 4+/5 as to improve functional strength for independent gait, increased standing tolerance and increased ADL ability. Baseline: 11/6: see above Goal status: INITIAL     PLAN:  PT FREQUENCY: 2x/week  PT DURATION: 12 weeks  PLANNED INTERVENTIONS: Therapeutic exercises, Therapeutic activity, Neuromuscular re-education, Balance training, Gait training, Patient/Family education, Joint mobilization, Stair training, Vestibular training, Canalith repositioning, DME instructions, Dry Needling, Cognitive remediation, Electrical stimulation, Wheelchair mobility training, Spinal mobilization, Cryotherapy, Moist heat, Taping, Vasopneumatic device, Traction, Ultrasound, Ionotophoresis 4mg /ml Dexamethasone, Manual therapy, and Re-evaluation  PLAN FOR NEXT SESSION: 5x STS, 10 MWT   , PT 04/25/2022, 4:30 PM

## 2022-04-26 ENCOUNTER — Ambulatory Visit: Payer: BC Managed Care – PPO | Attending: Orthopedic Surgery

## 2022-04-26 DIAGNOSIS — M25552 Pain in left hip: Secondary | ICD-10-CM | POA: Insufficient documentation

## 2022-04-26 DIAGNOSIS — R262 Difficulty in walking, not elsewhere classified: Secondary | ICD-10-CM | POA: Diagnosis present

## 2022-04-26 DIAGNOSIS — M6281 Muscle weakness (generalized): Secondary | ICD-10-CM | POA: Insufficient documentation

## 2022-04-26 DIAGNOSIS — M25551 Pain in right hip: Secondary | ICD-10-CM | POA: Insufficient documentation

## 2022-05-01 ENCOUNTER — Ambulatory Visit: Payer: BC Managed Care – PPO

## 2022-05-01 DIAGNOSIS — R262 Difficulty in walking, not elsewhere classified: Secondary | ICD-10-CM

## 2022-05-01 DIAGNOSIS — M25552 Pain in left hip: Secondary | ICD-10-CM | POA: Diagnosis not present

## 2022-05-01 DIAGNOSIS — M6281 Muscle weakness (generalized): Secondary | ICD-10-CM

## 2022-05-01 NOTE — Therapy (Signed)
OUTPATIENT PHYSICAL THERAPY LOWER EXTREMITY TREATMENT   Patient Name: Christine Chavez MRN: [id number] DOB:Jan 20, 1981, 42 y.o., female Today's Date: 05/01/2022   PT End of Session - 05/01/22 1056     Visit Number 9    Number of Visits 24    Date for PT Re-Evaluation 05/15/22    PT Start Time 1100    PT Stop Time 1144    PT Time Calculation (min) 44 min    Equipment Utilized During Treatment Gait belt    Activity Tolerance Patient tolerated treatment well;Patient limited by pain    Behavior During Therapy WFL for tasks assessed/performed                  Past Medical History:  Diagnosis Date   Complication of anesthesia    ? seizures after anesthesia    Headache    Migraines    Neurogenic bladder    Renal disorder    Vision abnormalities    Past Surgical History:  Procedure Laterality Date   ANTERIOR CRUCIATE LIGAMENT REPAIR  1997   APPENDECTOMY     COLONOSCOPY WITH PROPOFOL N/A 11/11/2018   Procedure: COLONOSCOPY WITH PROPOFOL;  Surgeon: [name], MD;  Location[facility] ENDOSCOPY;  Service: Endoscopy;  Laterality: N/A;   CYSTOSCOPY WITH STENT PLACEMENT Right 04/17/2016   Procedure: CYSTOSCOPY WITH STENT PLACEMENT;  Surgeon: [name], MD;  Location: [facility] ORS;  Service: Urology;  Laterality: Right;   ESOPHAGOGASTRODUODENOSCOPY (EGD) WITH PROPOFOL N/A 11/11/2018   Procedure: ESOPHAGOGASTRODUODENOSCOPY (EGD) WITH PROPOFOL;  Surgeon: [name], MD;  Location: [facility] ENDOSCOPY;  Service: Endoscopy;  Laterality: N/A;   EXPLORATORY LAPAROTOMY  1999   KIDNEY STONE SURGERY  04/2016   REVISION UROSTOMY CUTANEOUS     REVISION UROSTOMY CUTANEOUS  01/10/2018   SUPRAPUBIC CATHETER PLACEMENT  08/2017   TONSILLECTOMY     Patient Active Problem List   Diagnosis Date Noted   Seizure ([place]) 11/11/2018   Major depressive disorder, recurrent episode, moderate ([place]) 02/07/2018   Nephrolithiasis 04/16/2016   Numbness 07/28/2015   Bladder retention  06/23/2015   Abdominal pain 06/04/2015   Dizziness 05/18/2015   Neck pain 84/69/6295   Complicated migraine 28/41/3244   Other fatigue 04/28/2015   Abnormal finding on MRI of brain 04/28/2015   D (diarrhea) 03/29/2015   H/O disease 03/29/2015   Abnormal weight loss 03/29/2015   Muscle weakness (generalized) 03/14/2015   Headache, migraine 03/10/2015    PCP: Elza Rafter MD  REFERRING PROVIDER: Genia Del MD  REFERRING DIAG: s/p hip surgery, degenerative tear of acetabular labrum   THERAPY DIAG:  Pain in left hip  Difficulty in walking, not elsewhere classified  Muscle weakness (generalized)  Rationale for Evaluation and Treatment: Rehabilitation  ONSET DATE: 12/28/21  SUBJECTIVE:   SUBJECTIVE STATEMENT: Patient had to get her catheter removed since last session due to having pain in standing.   PERTINENT HISTORY: Patient is returning to PT s/p L hip surgery 12/28/21.  Patient received HHPT afterwards. 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 Patient is going to get a whole scope of bladder 03/16/22. Patient has been seen prior to surgery by this author for her Hereditary Spastic Paraplegia (HSP). PMH includes bilateral Le edema, BLE weakness, constipation, chronic pain, GERD, tremor, HSP, migraine, neurogenic bladder,seizures, depression, migraine, COVID 19, chronic UTI, urostomy. Patient is on protocol of 50% WB until week 8.  PAIN:  Are you having pain? Yes: NPRS  scale: 7/10 Pain location: back Pain description: spasm Aggravating factors: n/a Relieving factors: n/a   PRECAUTIONS: Anterior hip and Fall  WEIGHT BEARING RESTRICTIONS: Yes WBAT  at 8 weeks, 50% WB today.   FALLS:  Has patient fallen in last 6 months? No  LIVING ENVIRONMENT: Lives with: lives with their family Lives in: House/apartment Stairs: Yes: External: 0 steps; ramp Has following equipment at home: Walker -  4 wheeled and Wheelchair (manual)    PLOF: Independent with basic ADLs  PATIENT GOALS: to be able to ADLs independently.    OBJECTIVE:   DIAGNOSTIC FINDINGS: s/p surgery  PATIENT SURVEYS:  FOTO 34  COGNITION: Overall cognitive status: Within functional limits for tasks assessed     SENSATION: Light touch: Impaired L lateral thigh      POSTURE: No Significant postural limitations  PALPATION: Tender at post surgical site  LOWER EXTREMITY ROM:  Active ROM Right eval Left eval  Hip flexion PROM 70 AROM 35 PROM 65 AROM 5  Hip abduction PROM 25 AROM 23 PROM 35 AROM 4    (Blank rows = not tested)  LOWER EXTREMITY MMT:  MMT Right eval Left eval  Hip flexion 4/5 3+/5  Hip extension    Hip abduction 4-/5 3/5  Hip adduction 3+/5 3/5  Hip internal rotation    Hip external rotation    Knee flexion 4-/5 3+/5 Painful  Knee extension 4-/5 3/5  Ankle dorsiflexion 3+/5 3/5  Ankle plantarflexion 3+/5 3/5  Ankle inversion    Ankle eversion     (Blank rows = not tested)  FUNCTIONAL TESTS:  5 times sit to stand: 43.64  SPT: 50% WB on LLE; Min A for performance  Supine<>sit CGA with min A for LLE   GAIT: Will assess next session when WBAT   TODAY'S TREATMENT:                                                                                                                              DATE: 05/01/22  TherEx:  Supine: Bridge 10x; 2 sets Hip abduction GTB 15x; 2 sets Hip adduction ball squeeze 15x; 2 sets GTB march 15x each LE 2 sets Heel slide 15x each LE  LE rotation 20x  Seated: LAQ 10x each LE March 10x each LE Hip abduction 10x each LE Standing tolerance: terminated due to bladder spasm   SPT x2; with mod A for safety   PATIENT EDUCATION:  Education details: goals, POC  Person educated: Patient Education method: Explanation Education comprehension: verbalized understanding and returned demonstration  HOME EXERCISE PROGRAM:  Access Code:  [email or web address] URL: [email or web address] Date: 04/26/2022 Prepared by: Precious Bard  Exercises - Supine Bridge  - 1 x daily - 7 x weekly - 2 sets - 10 reps - 5 hold - Supine Lower Trunk Rotation  - 1 x daily - 7 x weekly - 2 sets - 10 reps - 5 hold - Clamshell  - 1 x daily -  7 x weekly - 2 sets - 10 reps - 5 hold - Supine Posterior Pelvic Tilt  - 1 x daily - 7 x weekly - 2 sets - 10 reps - 5 hold - Supine Hip Adduction Isometric with Ball  - 1 x daily - 7 x weekly - 2 sets - 10 reps - 5 hold - Supine March  - 1 x daily - 7 x weekly - 2 sets - 10 reps - 5 hold - Supine Heel Slide  - 1 x daily - 7 x weekly - 2 sets - 10 reps - 5 hold - Hooklying Clamshell with Resistance  - 1 x daily - 7 x weekly - 2 sets - 10 reps - 5 hold - Supine Gluteal Sets  - 1 x daily - 7 x weekly - 2 sets - 10 reps - 5 hold  ASSESSMENT:  CLINICAL IMPRESSION: Patient presents with increased bladder spasm. Unable to tolerate standing interventions without increased spasm. Patient remains highly motivated despite pain. She tolerates all supine and seated interventions. Patient is challenged with heel slide with increased tone in [place] when performing.   Patient will benefit from skilled physical therapy to increase functional mobility, strength, and quality of life.   OBJECTIVE IMPAIRMENTS: Abnormal gait, decreased activity tolerance, decreased balance, decreased mobility, difficulty walking, decreased ROM, decreased strength, increased fascial restrictions, impaired perceived functional ability, impaired flexibility, impaired sensation, improper body mechanics, and pain.   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 PMH includes bilateral Le edema, BLE weakness, constipation, chronic pain, GERD, tremor, HSP, migraine, neurogenic bladder,seizures, depression, migraine, COVID 19, chronic UTI, urostomy  are also affecting patient's functional outcome.   REHAB POTENTIAL: Good  CLINICAL DECISION MAKING: Evolving/moderate complexity  EVALUATION COMPLEXITY: Moderate   GOALS: Goals reviewed with patient? Yes  SHORT TERM GOALS: Target date: 03/20/2022  Patient will be independent in home exercise program to improve strength/mobility for better functional independence with ADLs. Baseline: 02/20/22; give next session Goal status: INITIAL    LONG TERM GOALS: Target date: 05/15/2022   Patient will increase FOTO score to equal to or greater than  49%   to demonstrate statistically significant improvement in mobility and quality of life.  Baseline: 11/6: 34% Goal status: INITIAL  2.   Patient (< 77 years old) will complete five times sit to stand test in < 10 seconds indicating an increased LE strength and improved balance. Baseline: 11/6: perform next session 11/8: 43.64 with BUE support Goal status: INITIAL  3.  Patient will increase 10 meter walk test to less than 30 seconds as to improve gait speed for better community ambulation and to reduce fall risk. Baseline: 11/6: perform next session; 11/8: unable to perform full length: did 20.2 ft in 2 min 21 seconds with rollator Goal status: INITIAL  4.  Patient will increase BLE gross strength to 4+/5 as to improve functional strength for independent gait, increased standing tolerance and increased ADL ability. Baseline: 11/6: see above Goal status: INITIAL     PLAN:  PT FREQUENCY: 2x/week  PT DURATION: 12 weeks  PLANNED INTERVENTIONS:  Therapeutic exercises, Therapeutic activity, Neuromuscular re-education, Balance training, Gait training, Patient/Family education,  Joint mobilization, Stair training, Vestibular training, Canalith repositioning, DME instructions, Dry Needling, Cognitive remediation, Electrical stimulation, Wheelchair mobility training, Spinal mobilization, Cryotherapy, Moist heat, Taping, Vasopneumatic device, Traction, Ultrasound, Ionotophoresis 4mg /ml Dexamethasone, Manual therapy, and Re-evaluation  PLAN FOR NEXT SESSION: 5x STS, 10 MWT   Janna Arch, PT 05/01/2022, 11:55 AM

## 2022-05-02 NOTE — Therapy (Signed)
OUTPATIENT PHYSICAL THERAPY LOWER EXTREMITY TREATMENT/ Physical Therapy Progress Note   Dates of reporting period  02/20/22   to   05/03/22    Patient Name: Christine Chavez MRN: [id number] DOB:[birth year], 42 y.o., female Today's Date: 05/03/2022   PT End of Session - 05/03/22 1112     Visit Number 10    Number of Visits 24    Date for PT Re-Evaluation 05/15/22    PT Start Time 1105    PT Stop Time 1148    PT Time Calculation (min) 43 min    Equipment Utilized During Treatment Gait belt    Activity Tolerance Patient tolerated treatment well;Patient limited by pain    Behavior During Therapy WFL for tasks assessed/performed                   Past Medical History:  Diagnosis Date   Complication of anesthesia    ? seizures after anesthesia    Headache    Migraines    Neurogenic bladder    Renal disorder    Vision abnormalities    Past Surgical History:  Procedure Laterality Date   ANTERIOR CRUCIATE LIGAMENT REPAIR  1997   APPENDECTOMY     COLONOSCOPY WITH PROPOFOL N/A 11/11/2018   Procedure: COLONOSCOPY WITH PROPOFOL;  Surgeon: Christena Deem, MD;  Location: [facility] ENDOSCOPY;  Service: Endoscopy;  Laterality: N/A;   CYSTOSCOPY WITH STENT PLACEMENT Right 04/17/2016   Procedure: CYSTOSCOPY WITH STENT PLACEMENT;  Surgeon: Malen Gauze, MD;  Location: [facility] ORS;  Service: Urology;  Laterality: Right;   ESOPHAGOGASTRODUODENOSCOPY (EGD) WITH PROPOFOL N/A 11/11/2018   Procedure: ESOPHAGOGASTRODUODENOSCOPY (EGD) WITH PROPOFOL;  Surgeon: Christena Deem, MD;  Location: [facility] ENDOSCOPY;  Service: Endoscopy;  Laterality: N/A;   EXPLORATORY LAPAROTOMY  1999   KIDNEY STONE SURGERY  04/2016   REVISION UROSTOMY CUTANEOUS     REVISION UROSTOMY CUTANEOUS  01/10/2018   SUPRAPUBIC CATHETER PLACEMENT  08/2017   TONSILLECTOMY     Patient Active Problem List   Diagnosis Date Noted   Seizure (HCC) 11/11/2018   Major depressive disorder, recurrent episode, moderate (HCC)  02/07/2018   Nephrolithiasis 04/16/2016   Numbness 07/28/2015   Bladder retention 06/23/2015   Abdominal pain 06/04/2015   Dizziness 05/18/2015   Neck pain 05/18/2015   Complicated migraine 04/28/2015   Other fatigue 04/28/2015   Abnormal finding on MRI of brain 04/28/2015   D (diarrhea) 03/29/2015   H/O disease 03/29/2015   Abnormal weight loss 03/29/2015   Muscle weakness (generalized) 03/14/2015   Headache, migraine 03/10/2015    PCP: Ermalinda Memos MD  REFERRING PROVIDER: Lacy Duverney MD  REFERRING DIAG: s/p hip surgery, degenerative tear of acetabular labrum   THERAPY DIAG:  Pain in left hip  Difficulty in walking, not elsewhere classified  Muscle weakness (generalized)  Pain in right hip  Rationale for Evaluation and Treatment: Rehabilitation  ONSET DATE: 12/28/21  SUBJECTIVE:   SUBJECTIVE STATEMENT: Patient is making preparations for her upcoming surgery.   PERTINENT HISTORY: Patient is returning to PT s/p L hip surgery 12/28/21.  Patient received HHPT afterwards. 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 Patient is going to get a whole scope of bladder 03/16/22. Patient has been seen prior to surgery by this author for her Hereditary Spastic Paraplegia (HSP). PMH includes bilateral Le edema, BLE weakness, constipation, chronic pain, GERD, tremor, HSP, migraine, neurogenic bladder,seizures, depression, migraine, COVID 19, chronic UTI, urostomy. Patient is  on protocol of 50% WB until week 8.  PAIN:  Are you having pain? Yes: NPRS scale: 7/10 Pain location: back Pain description: spasm Aggravating factors: n/a Relieving factors: n/a   PRECAUTIONS: Anterior hip and Fall  WEIGHT BEARING RESTRICTIONS: Yes WBAT  at 8 weeks, 50% WB today.   FALLS:  Has patient fallen in last 6 months? No  LIVING ENVIRONMENT: Lives with: lives with their family Lives in: House/apartment Stairs:  Yes: External: 0 steps; ramp Has following equipment at home: Walker - 4 wheeled and Wheelchair (manual)    PLOF: Independent with basic ADLs  PATIENT GOALS: to be able to ADLs independently.    OBJECTIVE:   DIAGNOSTIC FINDINGS: s/p surgery  PATIENT SURVEYS:  FOTO 34  COGNITION: Overall cognitive status: Within functional limits for tasks assessed     SENSATION: Light touch: Impaired L lateral thigh      POSTURE: No Significant postural limitations  PALPATION: Tender at post surgical site  LOWER EXTREMITY ROM:  Active ROM Right eval Left eval  Hip flexion PROM 70 AROM 35 PROM 65 AROM 5  Hip abduction PROM 25 AROM 23 PROM 35 AROM 4    (Blank rows = not tested)  LOWER EXTREMITY MMT:  MMT Right eval Left eval  Hip flexion 4/5 3+/5  Hip extension    Hip abduction 4-/5 3/5  Hip adduction 3+/5 3/5  Hip internal rotation    Hip external rotation    Knee flexion 4-/5 3+/5 Painful  Knee extension 4-/5 3/5  Ankle dorsiflexion 3+/5 3/5  Ankle plantarflexion 3+/5 3/5  Ankle inversion    Ankle eversion     (Blank rows = not tested)  FUNCTIONAL TESTS:  5 times sit to stand: 43.64  SPT: 50% WB on LLE; Min A for performance  Supine<>sit CGA with min A for LLE   GAIT: Will assess next session when WBAT   TODAY'S TREATMENT:                                                                                                                              DATE: 05/03/22 Goals performed; see below   TherEx:  Supine: Bridge 10x; Hip adduction ball squeeze 15x; 2 sets GTB march 15x each LE 2 sets LE rotation 20x   SPT x2; with mod A for safety   PATIENT EDUCATION:  Education details: goals, POC  Person educated: Patient Education method: Explanation Education comprehension: verbalized understanding and returned demonstration  HOME EXERCISE PROGRAM:  Access Code: [email or web address] URL: [email or web address] Date: 04/26/2022 Prepared by: Precious Bard  Exercises - Supine Bridge  - 1 x daily - 7 x weekly - 2 sets - 10 reps - 5 hold - Supine Lower Trunk Rotation  - 1 x daily - 7 x weekly - 2 sets - 10 reps - 5 hold - Clamshell  - 1 x daily - 7 x weekly - 2 sets - 10 reps - 5 hold - Supine  Posterior Pelvic Tilt  - 1 x daily - 7 x weekly - 2 sets - 10 reps - 5 hold - Supine Hip Adduction Isometric with Ball  - 1 x daily - 7 x weekly - 2 sets - 10 reps - 5 hold - Supine March  - 1 x daily - 7 x weekly - 2 sets - 10 reps - 5 hold - Supine Heel Slide  - 1 x daily - 7 x weekly - 2 sets - 10 reps - 5 hold - Hooklying Clamshell with Resistance  - 1 x daily - 7 x weekly - 2 sets - 10 reps - 5 hold - Supine Gluteal Sets  - 1 x daily - 7 x weekly - 2 sets - 10 reps - 5 hold  ASSESSMENT:  CLINICAL IMPRESSION: Patient is able to decrease her sit to stand time in half but continues to be limited in ambulation due to her bladder spasms resulting in hip collapse. Patient remains highly motivated despite pain and spasming. Patient's condition has the potential to improve in response to therapy. Maximum improvement is yet to be obtained. The anticipated improvement is attainable and reasonable in a generally predictable time.  Patient reports  Patient will benefit from skilled physical therapy to increase functional mobility, strength, and quality of life.   OBJECTIVE IMPAIRMENTS: Abnormal gait, decreased activity tolerance, decreased balance, decreased mobility, difficulty walking, decreased ROM, decreased strength, increased fascial restrictions, impaired perceived functional ability, impaired flexibility, impaired sensation, improper body mechanics, and pain.   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 PMH includes bilateral Le edema, BLE weakness, constipation, chronic pain, GERD, tremor, HSP, migraine, neurogenic bladder,seizures, depression, migraine, COVID 19, chronic UTI, urostomy  are also affecting patient's functional outcome.   REHAB POTENTIAL: Good  CLINICAL DECISION MAKING: Evolving/moderate complexity  EVALUATION COMPLEXITY: Moderate   GOALS: Goals reviewed with patient? Yes  SHORT TERM GOALS: Target date: 03/20/2022  Patient will be independent in home exercise program to improve strength/mobility for better functional independence with ADLs. Baseline: 02/20/22; give next session Goal status: INITIAL    LONG TERM GOALS: Target date: 05/15/2022   Patient will increase FOTO score to equal to or greater than  49%   to demonstrate statistically significant improvement in mobility and quality of life.  Baseline: 11/6: 34% 1/17: 34% Goal status: Partially Met   2.   Patient (< 104 years old) will complete five times sit to stand test in < 10 seconds indicating an increased LE strength and improved balance. Baseline: 11/6: perform next session 11/8: 43.64 with BUE support 1/17: 20 seconds with BUE support  Goal status: Partially Met  3.  Patient will increase 10 meter walk test to less than 30 seconds as to improve gait speed for better community ambulation and to reduce fall risk. Baseline: 11/6: perform next session; 11/8: unable to perform full length: did 20.2 ft in 2 min 21 seconds with rollator 1/17: did 10 ft in 43 seconds with rollator prior to hip collapse.  Goal status: On going  4.  Patient will increase BLE gross strength to 4+/5 as to improve functional strength for independent gait, increased standing  tolerance and increased ADL ability. Baseline: 11/6: see above 1/17: grossly 4-/5  Goal status: IN PROGRESS     PLAN:  PT FREQUENCY: 2x/week  PT DURATION: 12 weeks  PLANNED INTERVENTIONS: Therapeutic exercises, Therapeutic activity, Neuromuscular re-education, Balance training, Gait training, Patient/Family education, Joint mobilization, Stair training, Vestibular training, Canalith repositioning, DME instructions, Dry Needling, Cognitive remediation, Electrical stimulation, Wheelchair mobility training, Spinal mobilization, Cryotherapy, Moist heat, Taping, Vasopneumatic device, Traction, Ultrasound, Ionotophoresis 4mg /ml Dexamethasone, Manual therapy, and Re-evaluation  PLAN FOR NEXT SESSION: 5x STS, 10 MWT   Janna Arch, PT 05/03/2022, 2:01 PM

## 2022-05-03 ENCOUNTER — Ambulatory Visit: Payer: BC Managed Care – PPO

## 2022-05-03 DIAGNOSIS — R262 Difficulty in walking, not elsewhere classified: Secondary | ICD-10-CM

## 2022-05-03 DIAGNOSIS — M25551 Pain in right hip: Secondary | ICD-10-CM

## 2022-05-03 DIAGNOSIS — M25552 Pain in left hip: Secondary | ICD-10-CM | POA: Diagnosis not present

## 2022-05-03 DIAGNOSIS — M6281 Muscle weakness (generalized): Secondary | ICD-10-CM

## 2022-05-08 ENCOUNTER — Ambulatory Visit: Payer: BC Managed Care – PPO

## 2022-05-10 ENCOUNTER — Ambulatory Visit: Payer: BC Managed Care – PPO

## 2022-05-10 DIAGNOSIS — M6281 Muscle weakness (generalized): Secondary | ICD-10-CM

## 2022-05-10 DIAGNOSIS — M25552 Pain in left hip: Secondary | ICD-10-CM

## 2022-05-10 DIAGNOSIS — M25551 Pain in right hip: Secondary | ICD-10-CM

## 2022-05-10 DIAGNOSIS — R262 Difficulty in walking, not elsewhere classified: Secondary | ICD-10-CM

## 2022-05-10 NOTE — Therapy (Signed)
OUTPATIENT PHYSICAL THERAPY LOWER EXTREMITY TREATMENT  Patient Name: Christine Chavez MRN: [id number] DOB:May 07, 1980, 42 y.o., female Today's Date: 05/10/2022   PT End of Session - 05/10/22 1100     Visit Number 11    Number of Visits 24    Date for PT Re-Evaluation 05/15/22    PT Start Time 1100    PT Stop Time 1144    PT Time Calculation (min) 44 min    Equipment Utilized During Treatment Gait belt    Activity Tolerance Patient tolerated treatment well;Patient limited by pain    Behavior During Therapy WFL for tasks assessed/performed                   Past Medical History:  Diagnosis Date   Complication of anesthesia    ? seizures after anesthesia    Headache    Migraines    Neurogenic bladder    Renal disorder    Vision abnormalities    Past Surgical History:  Procedure Laterality Date   ANTERIOR CRUCIATE LIGAMENT REPAIR  1997   APPENDECTOMY     COLONOSCOPY WITH PROPOFOL N/A 11/11/2018   Procedure: COLONOSCOPY WITH PROPOFOL;  Surgeon: Christena Deem, MD;  Location: [facility] ENDOSCOPY;  Service: Endoscopy;  Laterality: N/A;   CYSTOSCOPY WITH STENT PLACEMENT Right 04/17/2016   Procedure: CYSTOSCOPY WITH STENT PLACEMENT;  Surgeon: Malen Gauze, MD;  Location: [facility] ORS;  Service: Urology;  Laterality: Right;   ESOPHAGOGASTRODUODENOSCOPY (EGD) WITH PROPOFOL N/A 11/11/2018   Procedure: ESOPHAGOGASTRODUODENOSCOPY (EGD) WITH PROPOFOL;  Surgeon: Christena Deem, MD;  Location: [facility] ENDOSCOPY;  Service: Endoscopy;  Laterality: N/A;   EXPLORATORY LAPAROTOMY  1999   KIDNEY STONE SURGERY  04/2016   REVISION UROSTOMY CUTANEOUS     REVISION UROSTOMY CUTANEOUS  01/10/2018   SUPRAPUBIC CATHETER PLACEMENT  08/2017   TONSILLECTOMY     Patient Active Problem List   Diagnosis Date Noted   Seizure (HCC) 11/11/2018   Major depressive disorder, recurrent episode, moderate (HCC) 02/07/2018   Nephrolithiasis 04/16/2016   Numbness 07/28/2015   Bladder retention  06/23/2015   Abdominal pain 06/04/2015   Dizziness 05/18/2015   Neck pain 05/18/2015   Complicated migraine 04/28/2015   Other fatigue 04/28/2015   Abnormal finding on MRI of brain 04/28/2015   D (diarrhea) 03/29/2015   H/O disease 03/29/2015   Abnormal weight loss 03/29/2015   Muscle weakness (generalized) 03/14/2015   Headache, migraine 03/10/2015    PCP: Ermalinda Memos MD  REFERRING PROVIDER: Lacy Duverney MD  REFERRING DIAG: s/p hip surgery, degenerative tear of acetabular labrum   THERAPY DIAG:  Pain in left hip  Difficulty in walking, not elsewhere classified  Muscle weakness (generalized)  Pain in right hip  Rationale for Evaluation and Treatment: Rehabilitation  ONSET DATE: 12/28/21  SUBJECTIVE:   SUBJECTIVE STATEMENT: Patient is making preparations for her upcoming surgery. Has seen neurologist since last session.   PERTINENT HISTORY: Patient is returning to PT s/p L hip surgery 12/28/21.  Patient received HHPT afterwards. 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 Patient is going to get a whole scope of bladder 03/16/22. Patient has been seen prior to surgery by this author for her Hereditary Spastic Paraplegia (HSP). PMH includes bilateral Le edema, BLE weakness, constipation, chronic pain, GERD, tremor, HSP, migraine, neurogenic bladder,seizures, depression, migraine, COVID 19, chronic UTI, urostomy. Patient is on protocol of 50% WB until week 8.  PAIN:  Are you having  pain? Yes: NPRS scale: 7/10 Pain location: back Pain description: spasm Aggravating factors: n/a Relieving factors: n/a   PRECAUTIONS: Anterior hip and Fall  WEIGHT BEARING RESTRICTIONS: Yes WBAT  at 8 weeks, 50% WB today.   FALLS:  Has patient fallen in last 6 months? No  LIVING ENVIRONMENT: Lives with: lives with their family Lives in: House/apartment Stairs: Yes: External: 0 steps; ramp Has following  equipment at home: Walker - 4 wheeled and Wheelchair (manual)    PLOF: Independent with basic ADLs  PATIENT GOALS: to be able to ADLs independently.    OBJECTIVE:   DIAGNOSTIC FINDINGS: s/p surgery  PATIENT SURVEYS:  FOTO 34  COGNITION: Overall cognitive status: Within functional limits for tasks assessed     SENSATION: Light touch: Impaired L lateral thigh      POSTURE: No Significant postural limitations  PALPATION: Tender at post surgical site  LOWER EXTREMITY ROM:  Active ROM Right eval Left eval  Hip flexion PROM 70 AROM 35 PROM 65 AROM 5  Hip abduction PROM 25 AROM 23 PROM 35 AROM 4    (Blank rows = not tested)  LOWER EXTREMITY MMT:  MMT Right eval Left eval  Hip flexion 4/5 3+/5  Hip extension    Hip abduction 4-/5 3/5  Hip adduction 3+/5 3/5  Hip internal rotation    Hip external rotation    Knee flexion 4-/5 3+/5 Painful  Knee extension 4-/5 3/5  Ankle dorsiflexion 3+/5 3/5  Ankle plantarflexion 3+/5 3/5  Ankle inversion    Ankle eversion     (Blank rows = not tested)  FUNCTIONAL TESTS:  5 times sit to stand: 43.64  SPT: 50% WB on LLE; Min A for performance  Supine<>sit CGA with min A for LLE   GAIT: Will assess next session when WBAT   TODAY'S TREATMENT:                                                                                                                              DATE: 05/10/22   TherEx:  Supine: Hamstring lengthening stretch 60 seconds each LE; x 2 trials Single knee to chest 60 seconds each LE Bridge 10x;x2 sets Hip adduction ball squeeze 15x; 2 sets GTB march 15x each LE 2 sets RTB hip abduction 15x each LE ; x 2 sets LE rotation 20x SAQ over bolster 15x each LE; x 2 trials Posterior pelvic tilt 10x  Seated: RTB hamstring curl 10x each LE RTB alternating LAQ 10x each LE RTB alternating ER 10x each LE    SPT x2; with mod A for safety   PATIENT EDUCATION:  Education details: goals, POC  Person  educated: Patient Education method: Explanation Education comprehension: verbalized understanding and returned demonstration  HOME EXERCISE PROGRAM:  Access Code: [email or web address] URL: [email or web address] Date: 04/26/2022 Prepared by: Janna Arch  Exercises - Supine Bridge  - 1 x daily - 7 x weekly - 2 sets - 10 reps - 5 hold -  Supine Lower Trunk Rotation  - 1 x daily - 7 x weekly - 2 sets - 10 reps - 5 hold - Clamshell  - 1 x daily - 7 x weekly - 2 sets - 10 reps - 5 hold - Supine Posterior Pelvic Tilt  - 1 x daily - 7 x weekly - 2 sets - 10 reps - 5 hold - Supine Hip Adduction Isometric with Ball  - 1 x daily - 7 x weekly - 2 sets - 10 reps - 5 hold - Supine March  - 1 x daily - 7 x weekly - 2 sets - 10 reps - 5 hold - Supine Heel Slide  - 1 x daily - 7 x weekly - 2 sets - 10 reps - 5 hold - Hooklying Clamshell with Resistance  - 1 x daily - 7 x weekly - 2 sets - 10 reps - 5 hold - Supine Gluteal Sets  - 1 x daily - 7 x weekly - 2 sets - 10 reps - 5 hold  ASSESSMENT:  CLINICAL IMPRESSION: Patient tolerates supine and seated interventions well. She does have increased tremors and spasm with effort. Buckling of limbs noted in standing. Patient does have pain but unsure if pain is kidney or musculature pain. Education on performance of supine exercise between sessions.Patient will benefit from skilled physical therapy to increase functional mobility, strength, and quality of life.   OBJECTIVE IMPAIRMENTS: Abnormal gait, decreased activity tolerance, decreased balance, decreased mobility, difficulty walking, decreased ROM, decreased strength, increased fascial restrictions, impaired perceived functional ability, impaired flexibility, impaired sensation, improper body mechanics, and pain.   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 PMH includes bilateral Le edema, BLE weakness, constipation, chronic pain, GERD, tremor, HSP, migraine, neurogenic bladder,seizures, depression, migraine, COVID 19, chronic UTI, urostomy  are also affecting patient's functional outcome.   REHAB POTENTIAL: Good  CLINICAL DECISION MAKING: Evolving/moderate complexity  EVALUATION COMPLEXITY: Moderate   GOALS: Goals reviewed with patient? Yes  SHORT TERM GOALS: Target date: 03/20/2022  Patient will be independent in home exercise program to improve strength/mobility for better functional independence with ADLs. Baseline: 02/20/22; give next session Goal status: INITIAL    LONG TERM GOALS: Target date: 05/15/2022   Patient will increase FOTO score to equal to or greater than  49%   to demonstrate statistically significant improvement in mobility and quality of life.  Baseline: 11/6: 34% 1/17: 34% Goal status: Partially Met   2.   Patient (< 80 years old) will complete five times sit to stand test in < 10 seconds indicating an increased LE strength and improved balance. Baseline: 11/6: perform next session 11/8: 43.64 with BUE support 1/17: 20 seconds with BUE support  Goal status: Partially Met  3.  Patient will increase 10 meter walk test to less than 30 seconds as to improve gait speed for better community ambulation and to reduce fall risk. Baseline: 11/6: perform next session; 11/8: unable to perform full length: did 20.2 ft in 2 min 21 seconds with rollator 1/17: did 10 ft in 43 seconds with rollator prior to hip collapse.  Goal status: On going  4.  Patient will  increase BLE gross strength to 4+/5 as to improve functional strength for independent gait, increased standing tolerance and increased ADL ability. Baseline: 11/6: see above 1/17: grossly 4-/5  Goal status: IN PROGRESS     PLAN:  PT FREQUENCY: 2x/week  PT DURATION: 12 weeks  PLANNED INTERVENTIONS: Therapeutic exercises, Therapeutic activity, Neuromuscular re-education, Balance training, Gait training, Patient/Family education, Joint mobilization, Stair training, Vestibular training, Canalith repositioning, DME instructions, Dry Needling, Cognitive remediation, Electrical stimulation, Wheelchair mobility training, Spinal mobilization, Cryotherapy, Moist heat, Taping, Vasopneumatic device, Traction, Ultrasound, Ionotophoresis 4mg /ml Dexamethasone, Manual therapy, and Re-evaluation  PLAN FOR NEXT SESSION: 5x STS, 10 MWT   Janna Arch, PT 05/10/2022, 1:07 PM

## 2022-05-11 NOTE — Therapy (Incomplete)
OUTPATIENT PHYSICAL THERAPY LOWER EXTREMITY TREATMENT/RECERT  Patient Name: Christine Chavez MRN: [id number] DOB:1981-02-19, 42 y.o., female Today's Date: 05/10/2022   PT End of Session - 05/10/22 1100     Visit Number 11    Number of Visits 24    Date for PT Re-Evaluation 05/15/22    PT Start Time 1100    PT Stop Time 1144    PT Time Calculation (min) 44 min    Equipment Utilized During Treatment Gait belt    Activity Tolerance Patient tolerated treatment well;Patient limited by pain    Behavior During Therapy WFL for tasks assessed/performed                   Past Medical History:  Diagnosis Date   Complication of anesthesia    ? seizures after anesthesia    Headache    Migraines    Neurogenic bladder    Renal disorder    Vision abnormalities    Past Surgical History:  Procedure Laterality Date   ANTERIOR CRUCIATE LIGAMENT REPAIR  1997   APPENDECTOMY     COLONOSCOPY WITH PROPOFOL N/A 11/11/2018   Procedure: COLONOSCOPY WITH PROPOFOL;  Surgeon: [name], MD;  Location: [facility] ENDOSCOPY;  Service: Endoscopy;  Laterality: N/A;   CYSTOSCOPY WITH STENT PLACEMENT Right 04/17/2016   Procedure: CYSTOSCOPY WITH STENT PLACEMENT;  Surgeon: [name], MD;  Location: [facility] ORS;  Service: Urology;  Laterality: Right;   ESOPHAGOGASTRODUODENOSCOPY (EGD) WITH PROPOFOL N/A 11/11/2018   Procedure: ESOPHAGOGASTRODUODENOSCOPY (EGD) WITH PROPOFOL;  Surgeon: [name], MD;  Location: [facility] ENDOSCOPY;  Service: Endoscopy;  Laterality: N/A;   EXPLORATORY LAPAROTOMY  1999   KIDNEY STONE SURGERY  04/2016   REVISION UROSTOMY CUTANEOUS     REVISION UROSTOMY CUTANEOUS  01/10/2018   SUPRAPUBIC CATHETER PLACEMENT  08/2017   TONSILLECTOMY     Patient Active Problem List   Diagnosis Date Noted   Seizure ([place]) 11/11/2018   Major depressive disorder, recurrent episode, moderate ([place]) 02/07/2018   Nephrolithiasis 04/16/2016   Numbness 07/28/2015   Bladder retention  06/23/2015   Abdominal pain 06/04/2015   Dizziness 05/18/2015   Neck pain 09/32/6712   Complicated migraine 45/80/9983   Other fatigue 04/28/2015   Abnormal finding on MRI of brain 04/28/2015   D (diarrhea) 03/29/2015   H/O disease 03/29/2015   Abnormal weight loss 03/29/2015   Muscle weakness (generalized) 03/14/2015   Headache, migraine 03/10/2015    PCP: Elza Rafter MD  REFERRING PROVIDER: Genia Del MD  REFERRING DIAG: s/p hip surgery, degenerative tear of acetabular labrum   THERAPY DIAG:  Pain in left hip  Difficulty in walking, not elsewhere classified  Muscle weakness (generalized)  Pain in right hip  Rationale for Evaluation and Treatment: Rehabilitation  ONSET DATE: 12/28/21  SUBJECTIVE:   SUBJECTIVE STATEMENT: ***  PERTINENT HISTORY: Patient is returning to PT s/p L hip surgery 12/28/21.  Patient received HHPT afterwards. 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 Patient is going to get a whole scope of bladder 03/16/22. Patient has been seen prior to surgery by this author for her Hereditary Spastic Paraplegia (HSP). PMH includes bilateral Le edema, BLE weakness, constipation, chronic pain, GERD, tremor, HSP, migraine, neurogenic bladder,seizures, depression, migraine, COVID 19, chronic UTI, urostomy. Patient is on protocol of 50% WB until week 8.  PAIN:  Are you having pain? Yes: NPRS scale: 7/10 Pain location: back Pain description: spasm Aggravating factors: n/a  Relieving factors: n/a   PRECAUTIONS: Anterior hip and Fall  WEIGHT BEARING RESTRICTIONS: Yes WBAT  at 8 weeks, 50% WB today.   FALLS:  Has patient fallen in last 6 months? No  LIVING ENVIRONMENT: Lives with: lives with their family Lives in: House/apartment Stairs: Yes: External: 0 steps; ramp Has following equipment at home: Walker - 4 wheeled and Wheelchair (manual)    PLOF: Independent with  basic ADLs  PATIENT GOALS: to be able to ADLs independently.    OBJECTIVE:   DIAGNOSTIC FINDINGS: s/p surgery  PATIENT SURVEYS:  FOTO 34  COGNITION: Overall cognitive status: Within functional limits for tasks assessed     SENSATION: Light touch: Impaired L lateral thigh      POSTURE: No Significant postural limitations  PALPATION: Tender at post surgical site  LOWER EXTREMITY ROM:  Active ROM Right eval Left eval  Hip flexion PROM 70 AROM 35 PROM 65 AROM 5  Hip abduction PROM 25 AROM 23 PROM 35 AROM 4    (Blank rows = not tested)  LOWER EXTREMITY MMT:  MMT Right eval Left eval  Hip flexion 4/5 3+/5  Hip extension    Hip abduction 4-/5 3/5  Hip adduction 3+/5 3/5  Hip internal rotation    Hip external rotation    Knee flexion 4-/5 3+/5 Painful  Knee extension 4-/5 3/5  Ankle dorsiflexion 3+/5 3/5  Ankle plantarflexion 3+/5 3/5  Ankle inversion    Ankle eversion     (Blank rows = not tested)  FUNCTIONAL TESTS:  5 times sit to stand: 43.64  SPT: 50% WB on LLE; Min A for performance  Supine<>sit CGA with min A for LLE   GAIT: Will assess next session when WBAT   TODAY'S TREATMENT:                                                                                                                              DATE: 05/10/22   TherEx:  Supine: Hamstring lengthening stretch 60 seconds each LE; x 2 trials Single knee to chest 60 seconds each LE Bridge 10x;x2 sets Hip adduction ball squeeze 15x; 2 sets GTB march 15x each LE 2 sets RTB hip abduction 15x each LE ; x 2 sets LE rotation 20x SAQ over bolster 15x each LE; x 2 trials Posterior pelvic tilt 10x  Seated: RTB hamstring curl 10x each LE RTB alternating LAQ 10x each LE RTB alternating ER 10x each LE    SPT x2; with mod A for safety   PATIENT EDUCATION:  Education details: goals, POC  Person educated: Patient Education method: Explanation Education comprehension: verbalized  understanding and returned demonstration  HOME EXERCISE PROGRAM:  Access Code: [email or web address] URL: [email or web address] Date: 04/26/2022 Prepared by: Precious Bard  Exercises - Supine Bridge  - 1 x daily - 7 x weekly - 2 sets - 10 reps - 5 hold - Supine Lower Trunk Rotation  - 1 x daily - 7 x weekly -  2 sets - 10 reps - 5 hold - Clamshell  - 1 x daily - 7 x weekly - 2 sets - 10 reps - 5 hold - Supine Posterior Pelvic Tilt  - 1 x daily - 7 x weekly - 2 sets - 10 reps - 5 hold - Supine Hip Adduction Isometric with Ball  - 1 x daily - 7 x weekly - 2 sets - 10 reps - 5 hold - Supine March  - 1 x daily - 7 x weekly - 2 sets - 10 reps - 5 hold - Supine Heel Slide  - 1 x daily - 7 x weekly - 2 sets - 10 reps - 5 hold - Hooklying Clamshell with Resistance  - 1 x daily - 7 x weekly - 2 sets - 10 reps - 5 hold - Supine Gluteal Sets  - 1 x daily - 7 x weekly - 2 sets - 10 reps - 5 hold  ASSESSMENT:  CLINICAL IMPRESSION: *** .Patient will benefit from skilled physical therapy to increase functional mobility, strength, and quality of life.   OBJECTIVE IMPAIRMENTS: Abnormal gait, decreased activity tolerance, decreased balance, decreased mobility, difficulty walking, decreased ROM, decreased strength, increased fascial restrictions, impaired perceived functional ability, impaired flexibility, impaired sensation, improper body mechanics, and pain.   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 PMH includes bilateral Le edema, BLE weakness, constipation, chronic pain, GERD, tremor, HSP, migraine, neurogenic bladder,seizures, depression, migraine, COVID 19, chronic UTI, urostomy  are also affecting patient's functional outcome.   REHAB POTENTIAL: Good  CLINICAL DECISION MAKING: Evolving/moderate complexity  EVALUATION COMPLEXITY: Moderate   GOALS: Goals reviewed with patient? Yes  SHORT TERM GOALS: Target date: 03/20/2022  Patient will be independent in home exercise program to improve strength/mobility for better functional independence with ADLs. Baseline: 02/20/22; give next session Goal status: INITIAL    LONG TERM GOALS: Target date: 05/15/2022   Patient will increase FOTO score to equal to or greater than  49%   to demonstrate statistically significant improvement in mobility and quality of life.  Baseline: 11/6: 34% 1/17: 34% Goal status: Partially Met   2.   Patient (< 65 years old) will complete five times sit to stand test in < 10 seconds indicating an increased LE strength and improved balance. Baseline: 11/6: perform next session 11/8: 43.64 with BUE support 1/17: 20 seconds with BUE support  Goal status: Partially Met  3.  Patient will increase 10 meter walk test to less than 30 seconds as to improve gait speed for better community ambulation and to reduce fall risk. Baseline: 11/6: perform next session; 11/8: unable to perform full length: did 20.2 ft in 2 min 21 seconds with rollator 1/17: did 10 ft in 43 seconds with rollator prior to hip collapse.  Goal status: On going  4.  Patient will increase BLE gross strength to 4+/5 as to improve functional strength for independent gait, increased standing tolerance and increased ADL ability. Baseline: 11/6: see above 1/17: grossly 4-/5  Goal status: IN PROGRESS     PLAN:  PT FREQUENCY: 2x/week  PT DURATION: 12 weeks  PLANNED INTERVENTIONS: Therapeutic exercises, Therapeutic activity, Neuromuscular  re-education, Balance training, Gait training, Patient/Family education, Joint mobilization, Stair training, Vestibular training, Canalith repositioning, DME instructions, Dry Needling, Cognitive remediation, Electrical stimulation, Wheelchair mobility training, Spinal mobilization, Cryotherapy, Moist heat, Taping, Vasopneumatic device, Traction, Ultrasound, Ionotophoresis 4mg /ml Dexamethasone, Manual therapy, and Re-evaluation  PLAN FOR NEXT SESSION: 5x STS, 10 MWT   , PT 05/10/2022, 1:07 PM

## 2022-05-15 ENCOUNTER — Ambulatory Visit: Payer: BC Managed Care – PPO

## 2022-05-17 ENCOUNTER — Ambulatory Visit: Payer: BC Managed Care – PPO

## 2022-05-18 ENCOUNTER — Other Ambulatory Visit: Payer: Self-pay | Admitting: Gastroenterology

## 2022-05-18 DIAGNOSIS — K5900 Constipation, unspecified: Secondary | ICD-10-CM

## 2022-05-18 DIAGNOSIS — R140 Abdominal distension (gaseous): Secondary | ICD-10-CM

## 2022-05-22 ENCOUNTER — Ambulatory Visit: Payer: BC Managed Care – PPO | Attending: Orthopedic Surgery

## 2022-05-22 DIAGNOSIS — M25551 Pain in right hip: Secondary | ICD-10-CM | POA: Diagnosis present

## 2022-05-22 DIAGNOSIS — M25552 Pain in left hip: Secondary | ICD-10-CM | POA: Diagnosis present

## 2022-05-22 DIAGNOSIS — R262 Difficulty in walking, not elsewhere classified: Secondary | ICD-10-CM | POA: Insufficient documentation

## 2022-05-22 DIAGNOSIS — M6281 Muscle weakness (generalized): Secondary | ICD-10-CM | POA: Insufficient documentation

## 2022-05-22 NOTE — Therapy (Signed)
OUTPATIENT PHYSICAL THERAPY LOWER EXTREMITY TREATMENT/RECERT  Patient Name: Christine Chavez MRN: [id number] DOB:May 10, 1980, 42 y.o., female Today's Date: 05/22/2022   PT End of Session - 05/22/22 1055     Visit Number 12    Number of Visits 36    Date for PT Re-Evaluation 08/14/22    PT Start Time 1100    PT Stop Time 1144    PT Time Calculation (min) 44 min    Equipment Utilized During Treatment Gait belt    Activity Tolerance Patient tolerated treatment well;Patient limited by pain    Behavior During Therapy WFL for tasks assessed/performed                   Past Medical History:  Diagnosis Date   Complication of anesthesia    ? seizures after anesthesia    Headache    Migraines    Neurogenic bladder    Renal disorder    Vision abnormalities    Past Surgical History:  Procedure Laterality Date   ANTERIOR CRUCIATE LIGAMENT REPAIR  1997   APPENDECTOMY     COLONOSCOPY WITH PROPOFOL N/A 11/11/2018   Procedure: COLONOSCOPY WITH PROPOFOL;  Surgeon: Christena Deem, MD;  Location: [facility] ENDOSCOPY;  Service: Endoscopy;  Laterality: N/A;   CYSTOSCOPY WITH STENT PLACEMENT Right 04/17/2016   Procedure: CYSTOSCOPY WITH STENT PLACEMENT;  Surgeon: Malen Gauze, MD;  Location: [facility] ORS;  Service: Urology;  Laterality: Right;   ESOPHAGOGASTRODUODENOSCOPY (EGD) WITH PROPOFOL N/A 11/11/2018   Procedure: ESOPHAGOGASTRODUODENOSCOPY (EGD) WITH PROPOFOL;  Surgeon: Christena Deem, MD;  Location: [facility] ENDOSCOPY;  Service: Endoscopy;  Laterality: N/A;   EXPLORATORY LAPAROTOMY  1999   KIDNEY STONE SURGERY  04/2016   REVISION UROSTOMY CUTANEOUS     REVISION UROSTOMY CUTANEOUS  01/10/2018   SUPRAPUBIC CATHETER PLACEMENT  08/2017   TONSILLECTOMY     Patient Active Problem List   Diagnosis Date Noted   Seizure ([facility]) 11/11/2018   Major depressive disorder, recurrent episode, moderate ([facility]) 02/07/2018   Nephrolithiasis 04/16/2016   Numbness 07/28/2015   Bladder retention  06/23/2015   Abdominal pain 06/04/2015   Dizziness 05/18/2015   Neck pain 05/18/2015   Complicated migraine 04/28/2015   Other fatigue 04/28/2015   Abnormal finding on MRI of brain 04/28/2015   D (diarrhea) 03/29/2015   H/O disease 03/29/2015   Abnormal weight loss 03/29/2015   Muscle weakness (generalized) 03/14/2015   Headache, migraine 03/10/2015    PCP: Ermalinda Memos MD  REFERRING PROVIDER: Lacy Duverney MD  REFERRING DIAG: s/p hip surgery, degenerative tear of acetabular labrum   THERAPY DIAG:  Pain in left hip  Difficulty in walking, not elsewhere classified  Muscle weakness (generalized)  Pain in right hip  Rationale for Evaluation and Treatment: Rehabilitation  ONSET DATE: 12/28/21  SUBJECTIVE:   SUBJECTIVE STATEMENT: Patient has surgery next week, is preparing for it.   PERTINENT HISTORY: Patient is returning to PT s/p L hip surgery 12/28/21.  Patient received HHPT afterwards. 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 Patient is going to get a whole scope of bladder 03/16/22. Patient has been seen prior to surgery by this author for her Hereditary Spastic Paraplegia (HSP). PMH includes bilateral Le edema, BLE weakness, constipation, chronic pain, GERD, tremor, HSP, migraine, neurogenic bladder,seizures, depression, migraine, COVID 19, chronic UTI, urostomy. Patient is on protocol of 50% WB until week 8.  PAIN:  Are you having pain? Yes: NPRS scale: 7/10  Pain location: back Pain description: spasm Aggravating factors: n/a Relieving factors: n/a   PRECAUTIONS: Anterior hip and Fall  WEIGHT BEARING RESTRICTIONS: Yes WBAT  at 8 weeks, 50% WB today.   FALLS:  Has patient fallen in last 6 months? No  LIVING ENVIRONMENT: Lives with: lives with their family Lives in: House/apartment Stairs: Yes: External: 0 steps; ramp Has following equipment at home: Walker - 4 wheeled and  Wheelchair (manual)    PLOF: Independent with basic ADLs  PATIENT GOALS: to be able to ADLs independently.    OBJECTIVE:   DIAGNOSTIC FINDINGS: s/p surgery  PATIENT SURVEYS:  FOTO 34  COGNITION: Overall cognitive status: Within functional limits for tasks assessed     SENSATION: Light touch: Impaired L lateral thigh      POSTURE: No Significant postural limitations  PALPATION: Tender at post surgical site  LOWER EXTREMITY ROM:  Active ROM Right eval Left eval  Hip flexion PROM 70 AROM 35 PROM 65 AROM 5  Hip abduction PROM 25 AROM 23 PROM 35 AROM 4    (Blank rows = not tested)  LOWER EXTREMITY MMT:  MMT Right eval Left eval  Hip flexion 4/5 3+/5  Hip extension    Hip abduction 4-/5 3/5  Hip adduction 3+/5 3/5  Hip internal rotation    Hip external rotation    Knee flexion 4-/5 3+/5 Painful  Knee extension 4-/5 3/5  Ankle dorsiflexion 3+/5 3/5  Ankle plantarflexion 3+/5 3/5  Ankle inversion    Ankle eversion     (Blank rows = not tested)  FUNCTIONAL TESTS:  5 times sit to stand: 43.64  SPT: 50% WB on LLE; Min A for performance  Supine<>sit CGA with min A for LLE   GAIT: Will assess next session when WBAT   TODAY'S TREATMENT:                                                                                                                              DATE: 05/22/22   TherEx:  Supine: Hamstring lengthening stretch 60 seconds each LE; x 2 trials Single knee to chest 60 seconds each LE Bridge 10x;x2 sets Hip adduction ball squeeze 15x; 2 sets GTB march 15x each LE 2 sets RTB hip abduction 15x each LE ; x 2 sets LE rotation 20x SAQ over bolster 15x each LE; x 2 trials Posterior pelvic tilt 10x  Sidelying: Clamshell 15x each LE Reverse clamshell 15x each LE   Seated:  alternating LAQ 15x each LE alternating ER 10x each LE  March 10x each LE   SPT x2; with mod A for safety   PATIENT EDUCATION:  Education details: goals, POC   Person educated: Patient Education method: Explanation Education comprehension: verbalized understanding and returned demonstration  HOME EXERCISE PROGRAM:  Access Code: [email or web address] URL: [email or web address] Date: 04/26/2022 Prepared by: Janna Arch  Exercises - Supine Bridge  - 1 x daily - 7 x weekly - 2 sets - 10  reps - 5 hold - Supine Lower Trunk Rotation  - 1 x daily - 7 x weekly - 2 sets - 10 reps - 5 hold - Clamshell  - 1 x daily - 7 x weekly - 2 sets - 10 reps - 5 hold - Supine Posterior Pelvic Tilt  - 1 x daily - 7 x weekly - 2 sets - 10 reps - 5 hold - Supine Hip Adduction Isometric with Ball  - 1 x daily - 7 x weekly - 2 sets - 10 reps - 5 hold - Supine March  - 1 x daily - 7 x weekly - 2 sets - 10 reps - 5 hold - Supine Heel Slide  - 1 x daily - 7 x weekly - 2 sets - 10 reps - 5 hold - Hooklying Clamshell with Resistance  - 1 x daily - 7 x weekly - 2 sets - 10 reps - 5 hold - Supine Gluteal Sets  - 1 x daily - 7 x weekly - 2 sets - 10 reps - 5 hold  ASSESSMENT:  CLINICAL IMPRESSION: Patient's goals performed 05/03/22,two sessions prior, please refer to this note for further details. Patient does have upcoming surgery on 05/31/22 and is aware she will need new orders s/p hospitalization. Will recert in case of delayed surgery to ensure full therapeutic value  Tolerates strengthening interventions with minimal pain increase this session.  .Patient will benefit from skilled physical therapy to increase functional mobility, strength, and quality of life.   OBJECTIVE IMPAIRMENTS: Abnormal gait, decreased activity tolerance, decreased balance, decreased mobility, difficulty walking, decreased ROM, decreased strength, increased fascial restrictions, impaired perceived functional ability, impaired flexibility, impaired sensation, improper body mechanics, and pain.   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 PMH includes bilateral Le edema, BLE weakness, constipation, chronic pain, GERD, tremor, HSP, migraine, neurogenic bladder,seizures, depression, migraine, COVID 19, chronic UTI, urostomy  are also affecting patient's functional outcome.   REHAB POTENTIAL: Good  CLINICAL DECISION MAKING: Evolving/moderate complexity  EVALUATION COMPLEXITY: Moderate   GOALS: Goals reviewed with patient? Yes  SHORT TERM GOALS: Target date: 03/20/2022  Patient will be independent in home exercise program to improve strength/mobility for better functional independence with ADLs. Baseline: 02/20/22; give next session 2/5: HEP compliant  Goal status: MET    LONG TERM GOALS: Target date: 08/14/2022    Patient will increase FOTO score to equal to or greater than  49%   to demonstrate statistically significant improvement in mobility and quality of life.  Baseline: 11/6: 34% 1/17: 34% Goal status: Partially Met   2.   Patient (< 44 years old) will complete five times sit to stand test in < 10 seconds indicating an increased LE strength and improved balance. Baseline: 11/6: perform next session 11/8: 43.64 with BUE support 1/17: 20 seconds with BUE support  Goal status: Partially Met  3.  Patient will increase 10 meter walk test to less than 30 seconds as to improve gait speed for better community ambulation and to reduce fall risk. Baseline: 11/6: perform next session; 11/8: unable to perform full length: did 20.2 ft in 2 min 21 seconds with rollator 1/17:  did 10 ft in 43 seconds with rollator prior to hip collapse.  Goal status: On going  4.  Patient will increase BLE gross strength to 4+/5 as to improve functional strength for independent gait, increased standing tolerance and increased ADL ability. Baseline: 11/6: see above 1/17: grossly 4-/5  Goal status: IN PROGRESS     PLAN:  PT FREQUENCY: 2x/week  PT DURATION: 12 weeks  PLANNED INTERVENTIONS: Therapeutic exercises, Therapeutic activity, Neuromuscular re-education, Balance training, Gait training, Patient/Family education, Joint mobilization, Stair training, Vestibular training, Canalith repositioning, DME instructions, Dry Needling, Cognitive remediation, Electrical stimulation, Wheelchair mobility training, Spinal mobilization, Cryotherapy, Moist heat, Taping, Vasopneumatic device, Traction, Ultrasound, Ionotophoresis 4mg /ml Dexamethasone, Manual therapy, and Re-evaluation  PLAN FOR NEXT SESSION: 5x STS, 10 MWT   Janna Arch, PT 05/22/2022, 11:58 AM

## 2022-05-23 NOTE — Therapy (Signed)
OUTPATIENT PHYSICAL THERAPY LOWER EXTREMITY TREATMENT  Patient Name: Christine Chavez MRN: [id number] DOB:22-Mar-1981, 42 y.o., female Today's Date: 05/24/2022   PT End of Session - 05/24/22 1218     Visit Number 13    Number of Visits 36    Date for PT Re-Evaluation 08/14/22    PT Start Time 1103    PT Stop Time 1148    PT Time Calculation (min) 45 min    Equipment Utilized During Treatment Gait belt    Activity Tolerance Patient tolerated treatment well;Patient limited by pain    Behavior During Therapy WFL for tasks assessed/performed                    Past Medical History:  Diagnosis Date   Complication of anesthesia    ? seizures after anesthesia    Headache    Migraines    Neurogenic bladder    Renal disorder    Vision abnormalities    Past Surgical History:  Procedure Laterality Date   ANTERIOR CRUCIATE LIGAMENT REPAIR  1997   APPENDECTOMY     COLONOSCOPY WITH PROPOFOL N/A 11/11/2018   Procedure: COLONOSCOPY WITH PROPOFOL;  Surgeon: [name], MD;  Location: [facility] ENDOSCOPY;  Service: Endoscopy;  Laterality: N/A;   CYSTOSCOPY WITH STENT PLACEMENT Right 04/17/2016   Procedure: CYSTOSCOPY WITH STENT PLACEMENT;  Surgeon: [name], MD;  Location: [facility] ORS;  Service: Urology;  Laterality: Right;   ESOPHAGOGASTRODUODENOSCOPY (EGD) WITH PROPOFOL N/A 11/11/2018   Procedure: ESOPHAGOGASTRODUODENOSCOPY (EGD) WITH PROPOFOL;  Surgeon: [name], MD;  Location: [facility] ENDOSCOPY;  Service: Endoscopy;  Laterality: N/A;   EXPLORATORY LAPAROTOMY  1999   KIDNEY STONE SURGERY  04/2016   REVISION UROSTOMY CUTANEOUS     REVISION UROSTOMY CUTANEOUS  01/10/2018   SUPRAPUBIC CATHETER PLACEMENT  08/2017   TONSILLECTOMY     Patient Active Problem List   Diagnosis Date Noted   Seizure ([place]) 11/11/2018   Major depressive disorder, recurrent episode, moderate ([place]) 02/07/2018   Nephrolithiasis 04/16/2016   Numbness 07/28/2015   Bladder retention  06/23/2015   Abdominal pain 06/04/2015   Dizziness 05/18/2015   Neck pain 38/25/0539   Complicated migraine 76/73/4193   Other fatigue 04/28/2015   Abnormal finding on MRI of brain 04/28/2015   D (diarrhea) 03/29/2015   H/O disease 03/29/2015   Abnormal weight loss 03/29/2015   Muscle weakness (generalized) 03/14/2015   Headache, migraine 03/10/2015    PCP: Elza Rafter MD  REFERRING PROVIDER: Genia Del MD  REFERRING DIAG: s/p hip surgery, degenerative tear of acetabular labrum   THERAPY DIAG:  Pain in left hip  Difficulty in walking, not elsewhere classified  Muscle weakness (generalized)  Pain in right hip  Rationale for Evaluation and Treatment: Rehabilitation  ONSET DATE: 12/28/21  SUBJECTIVE:   SUBJECTIVE STATEMENT: Patient has surgery next week.   PERTINENT HISTORY: Patient is returning to PT s/p L hip surgery 12/28/21.  Patient received HHPT afterwards. 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 Patient is going to get a whole scope of bladder 03/16/22. Patient has been seen prior to surgery by this author for her Hereditary Spastic Paraplegia (HSP). PMH includes bilateral Le edema, BLE weakness, constipation, chronic pain, GERD, tremor, HSP, migraine, neurogenic bladder,seizures, depression, migraine, COVID 19, chronic UTI, urostomy. Patient is on protocol of 50% WB until week 8.  PAIN:  Are you having pain? Yes: NPRS scale: 7/10 Pain location: back  Pain description: spasm Aggravating factors: n/a Relieving factors: n/a   PRECAUTIONS: Anterior hip and Fall  WEIGHT BEARING RESTRICTIONS: Yes WBAT  at 8 weeks, 50% WB today.   FALLS:  Has patient fallen in last 6 months? No  LIVING ENVIRONMENT: Lives with: lives with their family Lives in: House/apartment Stairs: Yes: External: 0 steps; ramp Has following equipment at home: Walker - 4 wheeled and Wheelchair  (manual)    PLOF: Independent with basic ADLs  PATIENT GOALS: to be able to ADLs independently.    OBJECTIVE:   DIAGNOSTIC FINDINGS: s/p surgery  PATIENT SURVEYS:  FOTO 34  COGNITION: Overall cognitive status: Within functional limits for tasks assessed     SENSATION: Light touch: Impaired L lateral thigh      POSTURE: No Significant postural limitations  PALPATION: Tender at post surgical site  LOWER EXTREMITY ROM:  Active ROM Right eval Left eval  Hip flexion PROM 70 AROM 35 PROM 65 AROM 5  Hip abduction PROM 25 AROM 23 PROM 35 AROM 4    (Blank rows = not tested)  LOWER EXTREMITY MMT:  MMT Right eval Left eval  Hip flexion 4/5 3+/5  Hip extension    Hip abduction 4-/5 3/5  Hip adduction 3+/5 3/5  Hip internal rotation    Hip external rotation    Knee flexion 4-/5 3+/5 Painful  Knee extension 4-/5 3/5  Ankle dorsiflexion 3+/5 3/5  Ankle plantarflexion 3+/5 3/5  Ankle inversion    Ankle eversion     (Blank rows = not tested)  FUNCTIONAL TESTS:  5 times sit to stand: 43.64  SPT: 50% WB on LLE; Min A for performance  Supine<>sit CGA with min A for LLE   GAIT: Will assess next session when WBAT   TODAY'S TREATMENT:                                                                                                                              DATE: 05/24/22   TherEx:  Supine: Hamstring lengthening stretch 60 seconds each LE; x 2 trials Single knee to chest 60 seconds each LE Bridge 10x;x2 sets Hip adduction ball squeeze 15x; 2 sets GTB march 15x each LE 2 sets RTB hip abduction 15x each LE ; x 2 sets LE rotation 20x SAQ over bolster 15x each LE; x 2 trials Posterior pelvic tilt 10x  Sidelying: Clamshell 15x each LE Reverse clamshell 15x each LE   Seated:  alternating LAQ 15x each LE alternating ER 10x each LE  March 10x each LE   SPT x2; with mod A for safety  Car transfer CGA for LE; assist for LE's into car.   PATIENT  EDUCATION:  Education details: goals, POC  Person educated: Patient Education method: Explanation Education comprehension: verbalized understanding and returned demonstration  HOME EXERCISE PROGRAM:  Access Code: [email or web address] URL: [email or web address] Date: 04/26/2022 Prepared by: Precious Bard  Exercises - Supine Bridge  - 1 x daily -  7 x weekly - 2 sets - 10 reps - 5 hold - Supine Lower Trunk Rotation  - 1 x daily - 7 x weekly - 2 sets - 10 reps - 5 hold - Clamshell  - 1 x daily - 7 x weekly - 2 sets - 10 reps - 5 hold - Supine Posterior Pelvic Tilt  - 1 x daily - 7 x weekly - 2 sets - 10 reps - 5 hold - Supine Hip Adduction Isometric with Ball  - 1 x daily - 7 x weekly - 2 sets - 10 reps - 5 hold - Supine March  - 1 x daily - 7 x weekly - 2 sets - 10 reps - 5 hold - Supine Heel Slide  - 1 x daily - 7 x weekly - 2 sets - 10 reps - 5 hold - Hooklying Clamshell with Resistance  - 1 x daily - 7 x weekly - 2 sets - 10 reps - 5 hold - Supine Gluteal Sets  - 1 x daily - 7 x weekly - 2 sets - 10 reps - 5 hold  ASSESSMENT:  CLINICAL IMPRESSION: Patient to undergo surgery next week. Is aware she will require a new order to return to PF, will hold on discharge until day of surgery due to potential for surgery to be delayed. Patient tolerates all interventions well and performs car transfer this session with assistance for LE's. .Patient will benefit from skilled physical therapy to increase functional mobility, strength, and quality of life.   OBJECTIVE IMPAIRMENTS: Abnormal gait, decreased activity tolerance, decreased balance, decreased mobility, difficulty walking, decreased ROM, decreased strength, increased fascial restrictions, impaired perceived functional ability, impaired flexibility, impaired sensation, improper body mechanics, and pain.   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 PMH includes bilateral Le edema, BLE weakness, constipation, chronic pain, GERD, tremor, HSP, migraine, neurogenic bladder,seizures, depression, migraine, COVID 19, chronic UTI, urostomy  are also affecting patient's functional outcome.   REHAB POTENTIAL: Good  CLINICAL DECISION MAKING: Evolving/moderate complexity  EVALUATION COMPLEXITY: Moderate   GOALS: Goals reviewed with patient? Yes  SHORT TERM GOALS: Target date: 03/20/2022  Patient will be independent in home exercise program to improve strength/mobility for better functional independence with ADLs. Baseline: 02/20/22; give next session 2/5: HEP compliant  Goal status: MET    LONG TERM GOALS: Target date: 08/14/2022    Patient will increase FOTO score to equal to or greater than  49%   to demonstrate statistically significant improvement in mobility and quality of life.  Baseline: 11/6: 34% 1/17: 34% Goal status: Partially Met   2.   Patient (< 53 years old) will complete five times sit to stand test in < 10 seconds indicating an increased LE strength and improved balance. Baseline: 11/6: perform next session 11/8: 43.64 with BUE support 1/17: 20 seconds with BUE support  Goal status: Partially Met  3.  Patient will increase 10 meter walk test to less than 30 seconds as to improve gait speed for better community ambulation and to reduce fall risk. Baseline: 11/6: perform next session; 11/8: unable to perform full length: did 20.2 ft in 2 min 21 seconds with  rollator 1/17: did 10 ft in 43 seconds with  rollator prior to hip collapse.  Goal status: On going  4.  Patient will increase BLE gross strength to 4+/5 as to improve functional strength for independent gait, increased standing tolerance and increased ADL ability. Baseline: 11/6: see above 1/17: grossly 4-/5  Goal status: IN PROGRESS     PLAN:  PT FREQUENCY: 2x/week  PT DURATION: 12 weeks  PLANNED INTERVENTIONS: Therapeutic exercises, Therapeutic activity, Neuromuscular re-education, Balance training, Gait training, Patient/Family education, Joint mobilization, Stair training, Vestibular training, Canalith repositioning, DME instructions, Dry Needling, Cognitive remediation, Electrical stimulation, Wheelchair mobility training, Spinal mobilization, Cryotherapy, Moist heat, Taping, Vasopneumatic device, Traction, Ultrasound, Ionotophoresis 4mg /ml Dexamethasone, Manual therapy, and Re-evaluation  PLAN FOR NEXT SESSION: 5x STS, 10 MWT   Janna Arch, PT 05/24/2022, 12:20 PM

## 2022-05-24 ENCOUNTER — Ambulatory Visit: Payer: BC Managed Care – PPO

## 2022-05-24 DIAGNOSIS — R262 Difficulty in walking, not elsewhere classified: Secondary | ICD-10-CM

## 2022-05-24 DIAGNOSIS — M25551 Pain in right hip: Secondary | ICD-10-CM

## 2022-05-24 DIAGNOSIS — M25552 Pain in left hip: Secondary | ICD-10-CM

## 2022-05-24 DIAGNOSIS — M6281 Muscle weakness (generalized): Secondary | ICD-10-CM

## 2022-05-29 ENCOUNTER — Ambulatory Visit: Payer: BC Managed Care – PPO

## 2022-05-31 ENCOUNTER — Ambulatory Visit: Payer: BC Managed Care – PPO

## 2022-06-05 ENCOUNTER — Ambulatory Visit: Payer: BC Managed Care – PPO

## 2022-06-07 ENCOUNTER — Ambulatory Visit: Payer: BC Managed Care – PPO

## 2022-06-12 ENCOUNTER — Ambulatory Visit: Payer: BC Managed Care – PPO

## 2022-06-14 ENCOUNTER — Ambulatory Visit: Payer: BC Managed Care – PPO

## 2022-06-19 ENCOUNTER — Ambulatory Visit: Payer: BC Managed Care – PPO

## 2022-06-21 ENCOUNTER — Ambulatory Visit: Payer: BC Managed Care – PPO

## 2022-06-26 ENCOUNTER — Ambulatory Visit: Payer: BC Managed Care – PPO

## 2022-06-28 ENCOUNTER — Ambulatory Visit: Payer: BC Managed Care – PPO

## 2022-07-03 ENCOUNTER — Ambulatory Visit: Payer: BC Managed Care – PPO

## 2022-07-05 ENCOUNTER — Ambulatory Visit: Payer: BC Managed Care – PPO

## 2022-07-10 ENCOUNTER — Ambulatory Visit: Payer: BC Managed Care – PPO

## 2022-07-12 ENCOUNTER — Ambulatory Visit: Payer: BC Managed Care – PPO

## 2022-07-17 ENCOUNTER — Ambulatory Visit: Payer: BC Managed Care – PPO

## 2022-07-19 ENCOUNTER — Ambulatory Visit: Payer: BC Managed Care – PPO

## 2022-07-24 ENCOUNTER — Ambulatory Visit: Payer: BC Managed Care – PPO

## 2022-07-26 ENCOUNTER — Ambulatory Visit: Payer: BC Managed Care – PPO

## 2022-07-28 ENCOUNTER — Encounter: Payer: Self-pay | Admitting: Radiology

## 2022-07-28 ENCOUNTER — Ambulatory Visit
Admission: RE | Admit: 2022-07-28 | Discharge: 2022-07-28 | Disposition: A | Discharge status: Home / Self Care | Payer: BC Managed Care – PPO | Source: Ambulatory Visit | Attending: Gastroenterology | Admitting: Gastroenterology

## 2022-07-28 DIAGNOSIS — R140 Abdominal distension (gaseous): Secondary | ICD-10-CM | POA: Insufficient documentation

## 2022-07-28 DIAGNOSIS — K5900 Constipation, unspecified: Secondary | ICD-10-CM | POA: Diagnosis present

## 2022-07-28 MED ORDER — TECHNETIUM TC 99M SULFUR COLLOID
2.0000 | Freq: Once | INTRAVENOUS | Status: AC | PRN
  Administered 2022-07-28: 2.2 via ORAL

## 2022-07-31 ENCOUNTER — Ambulatory Visit: Payer: BC Managed Care – PPO

## 2022-08-02 ENCOUNTER — Ambulatory Visit: Payer: BC Managed Care – PPO

## 2022-08-07 ENCOUNTER — Ambulatory Visit: Payer: BC Managed Care – PPO

## 2022-08-09 ENCOUNTER — Ambulatory Visit: Payer: BC Managed Care – PPO

## 2022-08-14 ENCOUNTER — Ambulatory Visit: Payer: BC Managed Care – PPO

## 2022-08-16 ENCOUNTER — Ambulatory Visit: Payer: BC Managed Care – PPO

## 2022-08-21 ENCOUNTER — Ambulatory Visit: Payer: BC Managed Care – PPO

## 2022-08-23 ENCOUNTER — Ambulatory Visit: Payer: BC Managed Care – PPO

## 2022-08-28 ENCOUNTER — Ambulatory Visit: Payer: BC Managed Care – PPO

## 2022-08-30 ENCOUNTER — Ambulatory Visit: Payer: BC Managed Care – PPO

## 2022-09-04 ENCOUNTER — Ambulatory Visit: Payer: BC Managed Care – PPO

## 2022-09-06 ENCOUNTER — Ambulatory Visit: Payer: BC Managed Care – PPO

## 2022-09-13 ENCOUNTER — Ambulatory Visit: Payer: BC Managed Care – PPO

## 2022-09-18 ENCOUNTER — Ambulatory Visit: Payer: BC Managed Care – PPO

## 2022-09-20 ENCOUNTER — Ambulatory Visit: Payer: BC Managed Care – PPO

## 2022-09-21 ENCOUNTER — Ambulatory Visit
Admission: RE | Admit: 2022-09-21 | Discharge: 2022-09-21 | Disposition: A | Discharge status: Home / Self Care | Payer: BC Managed Care – PPO | Source: Ambulatory Visit | Attending: Family Medicine | Admitting: Family Medicine

## 2022-09-21 ENCOUNTER — Other Ambulatory Visit: Payer: Self-pay | Admitting: Family Medicine

## 2022-09-21 DIAGNOSIS — M79602 Pain in left arm: Secondary | ICD-10-CM | POA: Diagnosis present

## 2022-09-25 ENCOUNTER — Ambulatory Visit: Payer: BC Managed Care – PPO

## 2022-09-27 ENCOUNTER — Ambulatory Visit: Payer: BC Managed Care – PPO

## 2022-10-02 ENCOUNTER — Ambulatory Visit: Payer: BC Managed Care – PPO

## 2022-10-02 ENCOUNTER — Inpatient Hospital Stay: Payer: BC Managed Care – PPO

## 2022-10-02 ENCOUNTER — Encounter: Payer: Self-pay | Admitting: Internal Medicine

## 2022-10-02 ENCOUNTER — Inpatient Hospital Stay: Payer: BC Managed Care – PPO | Attending: Internal Medicine | Admitting: Internal Medicine

## 2022-10-02 VITALS — BP 155/128 | HR 127 | Temp 97.8°F | Resp 16 | Ht 67.0 in

## 2022-10-02 DIAGNOSIS — D649 Anemia, unspecified: Secondary | ICD-10-CM

## 2022-10-02 DIAGNOSIS — D509 Iron deficiency anemia, unspecified: Secondary | ICD-10-CM | POA: Insufficient documentation

## 2022-10-02 DIAGNOSIS — N319 Neuromuscular dysfunction of bladder, unspecified: Secondary | ICD-10-CM | POA: Diagnosis not present

## 2022-10-02 DIAGNOSIS — Z79899 Other long term (current) drug therapy: Secondary | ICD-10-CM | POA: Diagnosis not present

## 2022-10-02 DIAGNOSIS — G114 Hereditary spastic paraplegia: Secondary | ICD-10-CM | POA: Diagnosis not present

## 2022-10-02 DIAGNOSIS — R3989 Other symptoms and signs involving the genitourinary system: Secondary | ICD-10-CM

## 2022-10-02 LAB — CBC WITH DIFFERENTIAL/PLATELET
Abs Immature Granulocytes: 0.02 10*3/uL (ref 0.00–0.07)
Basophils Absolute: 0.1 10*3/uL (ref 0.0–0.1)
Basophils Relative: 1 %
Eosinophils Absolute: 0.1 10*3/uL (ref 0.0–0.5)
Eosinophils Relative: 1 %
HCT: 34.2 % — ABNORMAL LOW (ref 36.0–46.0)
Hemoglobin: 11 g/dL — ABNORMAL LOW (ref 12.0–15.0)
Immature Granulocytes: 0 %
Lymphocytes Relative: 19 %
Lymphs Abs: 1.7 10*3/uL (ref 0.7–4.0)
MCH: 27 pg (ref 26.0–34.0)
MCHC: 32.2 g/dL (ref 30.0–36.0)
MCV: 83.8 fL (ref 80.0–100.0)
Monocytes Absolute: 0.6 10*3/uL (ref 0.1–1.0)
Monocytes Relative: 7 %
Neutro Abs: 6.4 10*3/uL (ref 1.7–7.7)
Neutrophils Relative %: 72 %
Platelets: 331 10*3/uL (ref 150–400)
RBC: 4.08 MIL/uL (ref 3.87–5.11)
RDW: 21.9 % — ABNORMAL HIGH (ref 11.5–15.5)
WBC: 8.8 10*3/uL (ref 4.0–10.5)
nRBC: 0 % (ref 0.0–0.2)

## 2022-10-02 LAB — IRON AND TIBC
Iron: 32 ug/dL (ref 28–170)
Saturation Ratios: 9 % — ABNORMAL LOW (ref 10.4–31.8)
TIBC: 370 ug/dL (ref 250–450)
UIBC: 338 ug/dL

## 2022-10-02 LAB — COMPREHENSIVE METABOLIC PANEL
ALT: 13 U/L (ref 0–44)
AST: 15 U/L (ref 15–41)
Albumin: 4 g/dL (ref 3.5–5.0)
Alkaline Phosphatase: 76 U/L (ref 38–126)
Anion gap: 8 (ref 5–15)
BUN: 11 mg/dL (ref 6–20)
CO2: 25 mmol/L (ref 22–32)
Calcium: 8.7 mg/dL — ABNORMAL LOW (ref 8.9–10.3)
Chloride: 103 mmol/L (ref 98–111)
Creatinine, Ser: 0.69 mg/dL (ref 0.44–1.00)
GFR, Estimated: >60 mL/min (ref >60)
Glucose, Bld: 92 mg/dL (ref 70–99)
Potassium: 3.8 mmol/L (ref 3.5–5.1)
Sodium: 136 mmol/L (ref 135–145)
Total Bilirubin: 1 mg/dL (ref 0.3–1.2)
Total Protein: 7.7 g/dL (ref 6.5–8.1)

## 2022-10-02 LAB — FERRITIN: Ferritin: 221 ng/mL (ref 11–307)

## 2022-10-02 NOTE — Progress Notes (Signed)
[facility]  [phone] [phone]  ID: Christine Chavez OB: 21-Aug-1980  MR#: [id number]  QIO#:[id number]  Patient Care Team: Ardyth Man, Cordelia Poche as PCP - General (Family Medicine)  REFERRING PROVIDER: Ardyth Man, PA-C  REASON FOR REFERRAL: Iron deficiency anemia  HPI: Christine Chavez is a 42 y.o. female with diagnosis of hereditary spastic paraplegia, neurogenic bladder s/p simple cystectomy with ileal conduit was referred to hematology for management of iron deficiency anemia.  Patient has a complex medical history.  She was diagnosed with hereditary spastic paraplegia follows with neurology Dr. Sherryll Burger.  Was complicated by neurogenic bladder underwent simple cystectomy with ileal conduit (and hysterectomy and salpingectomy) in February 2024.  Postop complicated by presumed pyelonephritis and required hospitalization with IV antibiotic. Again admitted at [facility] from 5/20 to 09/13/2022 for UTI with Klebsiella and MSSA treated with 14 days of antibiotics.    Iron deficiency-had allergic reaction to iron dextran in the hospital that resolved with oral Benadryl.  Then received ferric gluconate infusion 250 mg x 4 doses.  Avoiding oral iron given ongoing issues with constipation.  She follows with Dr. Mia Creek.  Last colonoscopy was in July 2024 rectal bleeding and constipation.  Showed melanosis coli and poor rectal tone.  Upper endoscopy showed Schatzki ring, small hiatal hernia and erythematous mucosa in gastric antrum.  Today she was seen in the clinic with her mother-in-law.  Complains of severe pain in the right flank area and is concerned may be developing another UTI.  She has follow-up visit scheduled on Wednesday with her surgeon.  Taking oxycodone for now.   REVIEW OF SYSTEMS:   ROS  As per HPI. Otherwise, a complete review of systems is negative.  PAST MEDICAL HISTORY: Past Medical History:  Diagnosis Date   Complication of  anesthesia    ? seizures after anesthesia    Headache    Migraines    Neurogenic bladder    Renal disorder    Vision abnormalities     PAST SURGICAL HISTORY: Past Surgical History:  Procedure Laterality Date   ANTERIOR CRUCIATE LIGAMENT REPAIR  1997   APPENDECTOMY     BLADDER REMOVAL     COLONOSCOPY WITH PROPOFOL N/A 11/11/2018   Procedure: COLONOSCOPY WITH PROPOFOL;  Surgeon: Christena Deem, MD;  Location: [facility] ENDOSCOPY;  Service: Endoscopy;  Laterality: N/A;   CYSTOSCOPY WITH STENT PLACEMENT Right 04/17/2016   Procedure: CYSTOSCOPY WITH STENT PLACEMENT;  Surgeon: Malen Gauze, MD;  Location: [facility] ORS;  Service: Urology;  Laterality: Right;   ESOPHAGOGASTRODUODENOSCOPY (EGD) WITH PROPOFOL N/A 11/11/2018   Procedure: ESOPHAGOGASTRODUODENOSCOPY (EGD) WITH PROPOFOL;  Surgeon: Christena Deem, MD;  Location: [facility] ENDOSCOPY;  Service: Endoscopy;  Laterality: N/A;   EXPLORATORY LAPAROTOMY  1999   KIDNEY STONE SURGERY  04/2016   REVISION UROSTOMY CUTANEOUS     REVISION UROSTOMY CUTANEOUS  01/10/2018   SUPRAPUBIC CATHETER PLACEMENT  08/2017   TONSILLECTOMY      FAMILY HISTORY: Family History  Problem Relation Age of Onset   Hypertension Mother    Atrial fibrillation Father    Healthy Brother    Depression Brother    Arthritis/Rheumatoid Paternal Grandmother    Healthy Brother     HEALTH MAINTENANCE: Social History   Tobacco Use   Smoking status: Never   Smokeless tobacco: Never  Vaping Use   Vaping Use: Never used  Substance Use Topics   Alcohol use: No    Alcohol/week: 0.0 standard drinks of alcohol   Drug  use: No     Allergies  Allergen Reactions   Aspirin Shortness Of Breath, Swelling and Anaphylaxis   Ibuprofen Shortness Of Breath, Swelling and Anaphylaxis   Vibegron Anaphylaxis   Morphine And Codeine Hives   Methenamine Hippurate Rash    Current Outpatient Medications  Medication Sig Dispense Refill   B Complex Vitamins (VITAMIN B COMPLEX  100) INJ Inject as directed.     baclofen (LIORESAL) 10 MG tablet Take 10 mg by mouth 3 (three) times daily.     belladonna-opium (B&O SUPPRETTES) 16.2-30 MG suppository Place 30 mg rectally every 8 (eight) hours as needed for pain.     Cyanocobalamin (VITAMIN B-12) 2500 MCG SUBL Take 2,500 mcg by mouth daily.     cyanocobalamin 1000 MCG tablet Take by mouth.     diazepam (VALIUM) 2 MG tablet Take 2 mg by mouth every 6 (six) hours as needed for anxiety or muscle spasms (Vaginal application for PFM spasms).     DULoxetine (CYMBALTA) 60 MG capsule Take 60 mg by mouth daily.     EPINEPHrine 0.3 mg/0.3 mL IJ SOAJ injection Inject into the muscle.     HYDROmorphone (DILAUDID) 4 MG tablet Take 4 mg by mouth every 6 (six) hours as needed.     linaclotide (LINZESS) 145 MCG CAPS capsule TAKE 1 CAPSULE (145 MCG TOTAL) BY MOUTH ONCE DAILY     mirabegron ER (MYRBETRIQ) 50 MG TB24 tablet Take 50 mg by mouth daily.      mirtazapine (REMERON) 30 MG tablet Take by mouth.     ondansetron (ZOFRAN) 4 MG tablet Take 4 mg by mouth every 8 (eight) hours as needed for nausea or vomiting.     oxyCODONE (OXY IR/ROXICODONE) 5 MG immediate release tablet Take 5 mg by mouth every 6 (six) hours as needed.     pantoprazole (PROTONIX) 40 MG tablet Take 40 mg by mouth 2 (two) times daily.      pregabalin (LYRICA) 75 MG capsule Takes 2 caps po q am and 2 caps po QHS     promethazine (PHENERGAN) 12.5 MG tablet Take 1 tablet (12.5 mg total) by mouth every 4 (four) hours as needed for nausea or vomiting. 20 tablet 0   rizatriptan (MAXALT) 5 MG tablet Take 5 mg by mouth as needed.      tiZANidine (ZANAFLEX) 2 MG tablet Take 2 mg by mouth at bedtime.     traMADol (ULTRAM) 50 MG tablet Take 50 mg by mouth every 6 (six) hours as needed.     UBRELVY 50 MG TABS Take by mouth.     No current facility-administered medications for this visit.    OBJECTIVE: Vitals:   10/02/22 1133  BP: (!) 155/128  Pulse: (!) 127  Resp: 16  Temp:  97.8 F (36.6 C)  SpO2: 95%     Body mass index is 21.93 kg/m.      General: Well-developed, well-nourished, no acute distress. Eyes: Pink conjunctiva, anicteric sclera. HEENT: Normocephalic, moist mucous membranes, clear oropharnyx. Lungs: Clear to auscultation bilaterally. Heart: Regular rate and rhythm. No rubs, murmurs, or gallops. Abdomen: Soft, nontender, nondistended. No organomegaly noted, normoactive bowel sounds. Musculoskeletal: No edema, cyanosis, or clubbing. Neuro: Alert, answering all questions appropriately. Cranial nerves grossly intact. Skin: No rashes or petechiae noted. Psych: Normal affect. Lymphatics: No cervical, calvicular, axillary or inguinal LAD.   LAB RESULTS:  Lab Results  Component Value Date   NA 136 10/02/2022   K 3.8 10/02/2022   CL 103  10/02/2022   CO2 25 10/02/2022   GLUCOSE 92 10/02/2022   BUN 11 10/02/2022   CREATININE 0.69 10/02/2022   CALCIUM 8.7 (L) 10/02/2022   PROT 7.7 10/02/2022   ALBUMIN 4.0 10/02/2022   AST 15 10/02/2022   ALT 13 10/02/2022   ALKPHOS 76 10/02/2022   BILITOT 1.0 10/02/2022   GFRNONAA >60 10/02/2022   GFRAA >60 11/12/2018    Lab Results  Component Value Date   WBC 8.8 10/02/2022   NEUTROABS 6.4 10/02/2022   HGB 11.0 (L) 10/02/2022   HCT 34.2 (L) 10/02/2022   MCV 83.8 10/02/2022   PLT 331 10/02/2022    Lab Results  Component Value Date   TIBC 370 10/02/2022   TIBC 373 03/18/2021   FERRITIN 221 10/02/2022   FERRITIN 34 03/18/2021   IRONPCTSAT 9 (L) 10/02/2022   IRONPCTSAT 41 03/18/2021     STUDIES: US Venous Img Upper Uni Left (DVT)  Result Date: 09/21/2022 CLINICAL DATA:  LUE pain EXAM: LEFT UPPER EXTREMITY VENOUS DOPPLER ULTRASOUND TECHNIQUE: Gray-scale sonography with graded compression, as well as color Doppler and duplex ultrasound were performed to evaluate the upper extremity deep venous system from the level of the subclavian vein and including the jugular, axillary, basilic, radial,  ulnar and upper cephalic vein. Spectral Doppler was utilized to evaluate flow at rest and with distal augmentation maneuvers. COMPARISON:  None Available. FINDINGS: VENOUS Normal compressibility of the LEFT internal jugular, subclavian, axillary, basilic, brachial, radial and ulnar veins. No filling defects to suggest DVT on grayscale or color Doppler imaging. Doppler waveforms show normal direction of venous flow, normal respiratory plasticity and response to augmentation. Occlusive filling defect within the imaged portions of the peripheral cephalic vein. See key image. Limited views of the contralateral subclavian vein are unremarkable. OTHER No evidence of abnormal fluid collection. Limitations: none IMPRESSION: 1.  No evidence of DVT within the LEFT upper extremity. 2. POSITIVE for superficial thrombophlebitis within the peripheral cephalic vein. Roanna Banning, MD [facility]iology Electronically Signed   By: Roanna Banning M.D.   On: 09/21/2022 16:15    ASSESSMENT AND PLAN:   Christine Chavez is a 42 y.o. female with pmh of hereditary spastic paraplegia, neurogenic bladder s/p simple cystectomy with ileal conduit was referred to hematology for management of iron deficiency anemia.  # Iron deficiency anemia -Progressive. Labs from 09/10/2022 showed ferritin of 6 and saturation 4%.  CBC showed hemoglobin of 10.7. ?  Less likely to be related to blood loss from surgery.  Discussed ruling out occult GI bleed.  Follows with Dr. Mia Creek and she will reach out to his office. Had allergic reaction to IV iron dextran while she was in the hospital at [facility].  Then received IV ferric gluconate 250 mg 4 doses.  Avoiding oral iron due to ongoing issues with constipation.  She has received recommended dose of IV iron. I will hold off on further iron infusions. I will recheck her iron panel end of August to assess response to treatment.  # Hereditary spastic  paraplegia # Neurogenic bladder s/p simple cystectomy with ileal conduit in February 2024 -Hospitalized twice for UTI requiring IV antibiotics.  Complaining of severe right-sided flank pain and is concerned for another UTI.  Will check her CBC/CMP, UA with urine culture to assess for any acute changes.  Has follow-up appointment scheduled with surgeon office on Wednesday.  She understands in between her if her pain continues to get worse she needs to  report to ER.  Orders Placed This Encounter  Procedures   Urine Culture   CBC with Differential/Platelet   Comprehensive metabolic panel   Urinalysis, Complete w Microscopic   Iron and TIBC   Ferritin   CBC with Differential/Platelet   Iron and TIBC   Ferritin   RTC in 2 months for MD visit, labs   Patient expressed understanding and was in agreement with this plan. She also understands that She can call clinic at any time with any questions, concerns, or complaints.   I spent a total of 45 minutes reviewing chart data, face-to-face evaluation with the patient, counseling and coordination of care as detailed above.  Michaelyn Barter, MD   10/02/2022 4:52 PM

## 2022-10-02 NOTE — Progress Notes (Signed)
Had bladder removed in February. Lost a lot of blood during surgery and had to have blood transfusion. Has had iron deficiency anemia since. Has had tachycardia but is in the process of getting rescheduled with cardiologist.

## 2022-10-04 ENCOUNTER — Ambulatory Visit: Payer: BC Managed Care – PPO

## 2022-10-09 ENCOUNTER — Ambulatory Visit: Payer: BC Managed Care – PPO

## 2022-10-11 ENCOUNTER — Ambulatory Visit: Payer: BC Managed Care – PPO

## 2022-10-16 ENCOUNTER — Ambulatory Visit: Payer: BC Managed Care – PPO

## 2022-10-18 ENCOUNTER — Ambulatory Visit: Payer: BC Managed Care – PPO

## 2022-10-23 ENCOUNTER — Ambulatory Visit: Payer: BC Managed Care – PPO

## 2022-10-25 ENCOUNTER — Ambulatory Visit: Payer: BC Managed Care – PPO

## 2022-10-30 ENCOUNTER — Ambulatory Visit: Payer: BC Managed Care – PPO

## 2022-11-01 ENCOUNTER — Ambulatory Visit: Payer: BC Managed Care – PPO

## 2022-11-06 ENCOUNTER — Ambulatory Visit: Payer: BC Managed Care – PPO

## 2022-11-08 ENCOUNTER — Ambulatory Visit: Payer: BC Managed Care – PPO

## 2022-11-15 ENCOUNTER — Ambulatory Visit: Payer: BC Managed Care – PPO

## 2022-11-20 ENCOUNTER — Ambulatory Visit: Payer: BC Managed Care – PPO

## 2022-11-22 ENCOUNTER — Ambulatory Visit: Payer: BC Managed Care – PPO

## 2022-11-27 ENCOUNTER — Inpatient Hospital Stay: Payer: BC Managed Care – PPO | Admitting: Internal Medicine

## 2022-11-27 ENCOUNTER — Ambulatory Visit: Payer: BC Managed Care – PPO

## 2022-11-29 ENCOUNTER — Ambulatory Visit: Payer: BC Managed Care – PPO

## 2022-12-04 ENCOUNTER — Ambulatory Visit: Payer: BC Managed Care – PPO

## 2022-12-06 ENCOUNTER — Ambulatory Visit: Payer: BC Managed Care – PPO

## 2022-12-11 ENCOUNTER — Ambulatory Visit: Payer: BC Managed Care – PPO

## 2022-12-13 ENCOUNTER — Ambulatory Visit: Payer: BC Managed Care – PPO

## 2022-12-20 ENCOUNTER — Ambulatory Visit: Payer: 59

## 2022-12-25 ENCOUNTER — Ambulatory Visit: Payer: BC Managed Care – PPO

## 2022-12-27 ENCOUNTER — Ambulatory Visit: Payer: 59

## 2023-01-01 ENCOUNTER — Ambulatory Visit: Payer: BC Managed Care – PPO

## 2023-01-03 ENCOUNTER — Ambulatory Visit: Payer: BC Managed Care – PPO

## 2023-01-08 ENCOUNTER — Ambulatory Visit: Payer: BC Managed Care – PPO

## 2023-01-10 ENCOUNTER — Ambulatory Visit: Payer: BC Managed Care – PPO

## 2023-01-15 ENCOUNTER — Ambulatory Visit: Payer: 59

## 2023-01-17 ENCOUNTER — Ambulatory Visit: Payer: BC Managed Care – PPO

## 2023-01-22 ENCOUNTER — Ambulatory Visit: Payer: BC Managed Care – PPO

## 2023-01-24 ENCOUNTER — Ambulatory Visit: Payer: 59

## 2023-01-29 ENCOUNTER — Ambulatory Visit: Payer: BC Managed Care – PPO

## 2023-01-31 ENCOUNTER — Ambulatory Visit: Payer: 59

## 2023-02-05 ENCOUNTER — Ambulatory Visit: Payer: BC Managed Care – PPO

## 2023-02-07 ENCOUNTER — Ambulatory Visit: Payer: BC Managed Care – PPO

## 2023-02-12 ENCOUNTER — Ambulatory Visit: Payer: BC Managed Care – PPO

## 2023-02-13 ENCOUNTER — Other Ambulatory Visit: Payer: Self-pay

## 2023-02-13 ENCOUNTER — Emergency Department: Payer: BC Managed Care – PPO

## 2023-02-13 ENCOUNTER — Inpatient Hospital Stay
Admission: EM | Admit: 2023-02-13 | Discharge: 2023-02-22 | DRG: 699 | Disposition: A | Discharge status: Home / Self Care | Payer: BC Managed Care – PPO | Attending: Student in an Organized Health Care Education/Training Program | Admitting: Student in an Organized Health Care Education/Training Program

## 2023-02-13 DIAGNOSIS — Z888 Allergy status to other drugs, medicaments and biological substances status: Secondary | ICD-10-CM | POA: Diagnosis not present

## 2023-02-13 DIAGNOSIS — G43909 Migraine, unspecified, not intractable, without status migrainosus: Secondary | ICD-10-CM | POA: Diagnosis not present

## 2023-02-13 DIAGNOSIS — Z906 Acquired absence of other parts of urinary tract: Secondary | ICD-10-CM | POA: Diagnosis not present

## 2023-02-13 DIAGNOSIS — N99521 Infection of other external stoma of urinary tract: Secondary | ICD-10-CM | POA: Diagnosis present

## 2023-02-13 DIAGNOSIS — R112 Nausea with vomiting, unspecified: Secondary | ICD-10-CM

## 2023-02-13 DIAGNOSIS — Z515 Encounter for palliative care: Secondary | ICD-10-CM

## 2023-02-13 DIAGNOSIS — Z8249 Family history of ischemic heart disease and other diseases of the circulatory system: Secondary | ICD-10-CM

## 2023-02-13 DIAGNOSIS — E860 Dehydration: Secondary | ICD-10-CM | POA: Diagnosis present

## 2023-02-13 DIAGNOSIS — Z79899 Other long term (current) drug therapy: Secondary | ICD-10-CM

## 2023-02-13 DIAGNOSIS — Z886 Allergy status to analgesic agent status: Secondary | ICD-10-CM

## 2023-02-13 DIAGNOSIS — Z87440 Personal history of urinary (tract) infections: Secondary | ICD-10-CM

## 2023-02-13 DIAGNOSIS — E2749 Other adrenocortical insufficiency: Secondary | ICD-10-CM | POA: Diagnosis present

## 2023-02-13 DIAGNOSIS — E538 Deficiency of other specified B group vitamins: Secondary | ICD-10-CM | POA: Diagnosis present

## 2023-02-13 DIAGNOSIS — R55 Syncope and collapse: Secondary | ICD-10-CM

## 2023-02-13 DIAGNOSIS — N10 Acute tubulo-interstitial nephritis: Secondary | ICD-10-CM | POA: Diagnosis not present

## 2023-02-13 DIAGNOSIS — G114 Hereditary spastic paraplegia: Secondary | ICD-10-CM | POA: Insufficient documentation

## 2023-02-13 DIAGNOSIS — K760 Fatty (change of) liver, not elsewhere classified: Secondary | ICD-10-CM | POA: Insufficient documentation

## 2023-02-13 DIAGNOSIS — M62838 Other muscle spasm: Secondary | ICD-10-CM | POA: Diagnosis not present

## 2023-02-13 DIAGNOSIS — D509 Iron deficiency anemia, unspecified: Secondary | ICD-10-CM | POA: Diagnosis present

## 2023-02-13 DIAGNOSIS — Y838 Other surgical procedures as the cause of abnormal reaction of the patient, or of later complication, without mention of misadventure at the time of the procedure: Secondary | ICD-10-CM | POA: Diagnosis present

## 2023-02-13 DIAGNOSIS — B961 Klebsiella pneumoniae [K. pneumoniae] as the cause of diseases classified elsewhere: Secondary | ICD-10-CM | POA: Diagnosis present

## 2023-02-13 DIAGNOSIS — N319 Neuromuscular dysfunction of bladder, unspecified: Secondary | ICD-10-CM | POA: Insufficient documentation

## 2023-02-13 DIAGNOSIS — Z993 Dependence on wheelchair: Secondary | ICD-10-CM | POA: Diagnosis not present

## 2023-02-13 DIAGNOSIS — K5900 Constipation, unspecified: Secondary | ICD-10-CM | POA: Diagnosis not present

## 2023-02-13 DIAGNOSIS — Z818 Family history of other mental and behavioral disorders: Secondary | ICD-10-CM

## 2023-02-13 DIAGNOSIS — F419 Anxiety disorder, unspecified: Secondary | ICD-10-CM | POA: Diagnosis present

## 2023-02-13 DIAGNOSIS — R109 Unspecified abdominal pain: Secondary | ICD-10-CM | POA: Diagnosis not present

## 2023-02-13 DIAGNOSIS — N83201 Unspecified ovarian cyst, right side: Secondary | ICD-10-CM | POA: Diagnosis present

## 2023-02-13 DIAGNOSIS — Z9049 Acquired absence of other specified parts of digestive tract: Secondary | ICD-10-CM

## 2023-02-13 DIAGNOSIS — Z960 Presence of urogenital implants: Secondary | ICD-10-CM | POA: Diagnosis present

## 2023-02-13 DIAGNOSIS — Z96641 Presence of right artificial hip joint: Secondary | ICD-10-CM | POA: Diagnosis present

## 2023-02-13 DIAGNOSIS — N390 Urinary tract infection, site not specified: Principal | ICD-10-CM | POA: Diagnosis present

## 2023-02-13 DIAGNOSIS — B952 Enterococcus as the cause of diseases classified elsewhere: Secondary | ICD-10-CM | POA: Diagnosis not present

## 2023-02-13 DIAGNOSIS — F331 Major depressive disorder, recurrent, moderate: Secondary | ICD-10-CM | POA: Diagnosis present

## 2023-02-13 DIAGNOSIS — Z885 Allergy status to narcotic agent status: Secondary | ICD-10-CM

## 2023-02-13 DIAGNOSIS — R339 Retention of urine, unspecified: Secondary | ICD-10-CM | POA: Diagnosis present

## 2023-02-13 DIAGNOSIS — E2740 Unspecified adrenocortical insufficiency: Secondary | ICD-10-CM | POA: Diagnosis not present

## 2023-02-13 LAB — URINALYSIS, W/ REFLEX TO CULTURE (INFECTION SUSPECTED)
Bacteria, UA: NONE SEEN
Bilirubin Urine: NEGATIVE
Glucose, UA: NEGATIVE mg/dL
Ketones, ur: 20 mg/dL — AB
Nitrite: POSITIVE — AB
Protein, ur: NEGATIVE mg/dL
Specific Gravity, Urine: >1.046 — ABNORMAL HIGH (ref 1.005–1.030)
Squamous Epithelial / HPF: 0 /[HPF] (ref 0–5)
pH: 6 (ref 5.0–8.0)

## 2023-02-13 LAB — CBC
HCT: 39.2 % (ref 36.0–46.0)
Hemoglobin: 13.3 g/dL (ref 12.0–15.0)
MCH: 31.2 pg (ref 26.0–34.0)
MCHC: 33.9 g/dL (ref 30.0–36.0)
MCV: 92 fL (ref 80.0–100.0)
Platelets: 348 10*3/uL (ref 150–400)
RBC: 4.26 MIL/uL (ref 3.87–5.11)
RDW: 12.7 % (ref 11.5–15.5)
WBC: 10.9 10*3/uL — ABNORMAL HIGH (ref 4.0–10.5)
nRBC: 0 % (ref 0.0–0.2)

## 2023-02-13 LAB — COMPREHENSIVE METABOLIC PANEL
ALT: 23 U/L (ref 0–44)
AST: 20 U/L (ref 15–41)
Albumin: 4.2 g/dL (ref 3.5–5.0)
Alkaline Phosphatase: 50 U/L (ref 38–126)
Anion gap: 8 (ref 5–15)
BUN: 9 mg/dL (ref 6–20)
CO2: 21 mmol/L — ABNORMAL LOW (ref 22–32)
Calcium: 9.3 mg/dL (ref 8.9–10.3)
Chloride: 106 mmol/L (ref 98–111)
Creatinine, Ser: 0.76 mg/dL (ref 0.44–1.00)
GFR, Estimated: >60 mL/min (ref >60)
Glucose, Bld: 95 mg/dL (ref 70–99)
Potassium: 3.7 mmol/L (ref 3.5–5.1)
Sodium: 135 mmol/L (ref 135–145)
Total Bilirubin: 1.3 mg/dL — ABNORMAL HIGH (ref 0.3–1.2)
Total Protein: 7.7 g/dL (ref 6.5–8.1)

## 2023-02-13 LAB — LACTIC ACID, PLASMA
Lactic Acid, Venous: 1.3 mmol/L (ref 0.5–1.9)
Lactic Acid, Venous: 1.7 mmol/L (ref 0.5–1.9)

## 2023-02-13 LAB — PROTIME-INR
INR: 1 (ref 0.8–1.2)
Prothrombin Time: 13.7 s (ref 11.4–15.2)

## 2023-02-13 MED ORDER — HYDROCORTISONE 5 MG PO TABS: 5.0000 mg | ORAL_TABLET | Freq: Every day | ORAL | Status: DC

## 2023-02-13 MED ORDER — IOHEXOL 300 MG/ML  SOLN
100.0000 mL | Freq: Once | INTRAMUSCULAR | Status: AC | PRN
  Administered 2023-02-13: 100 mL via INTRAVENOUS

## 2023-02-13 MED ORDER — SODIUM CHLORIDE 0.9 % IV BOLUS (SEPSIS)
1000.0000 mL | Freq: Once | INTRAVENOUS | Status: AC
  Administered 2023-02-13: 1000 mL via INTRAVENOUS

## 2023-02-13 MED ORDER — PANTOPRAZOLE SODIUM 40 MG PO TBEC
40.0000 mg | DELAYED_RELEASE_TABLET | Freq: Every day | ORAL | Status: DC
  Administered 2023-02-14 – 2023-02-22 (×9): 40 mg via ORAL
  Filled 2023-02-13 (×9): qty 1

## 2023-02-13 MED ORDER — HYDROCORTISONE 10 MG PO TABS
10.0000 mg | ORAL_TABLET | Freq: Every day | ORAL | Status: DC
  Filled 2023-02-13: qty 1

## 2023-02-13 MED ORDER — ACETAMINOPHEN 325 MG PO TABS
650.0000 mg | ORAL_TABLET | Freq: Four times a day (QID) | ORAL | Status: DC | PRN
Start: 2023-02-13 — End: 2023-02-16

## 2023-02-13 MED ORDER — FENTANYL CITRATE PF 50 MCG/ML IJ SOSY
50.0000 ug | PREFILLED_SYRINGE | INTRAMUSCULAR | Status: AC | PRN
  Administered 2023-02-13: 50 ug via INTRAVENOUS
  Filled 2023-02-13: qty 1

## 2023-02-13 MED ORDER — SODIUM CHLORIDE 0.9 % IV SOLN
2.0000 g | Freq: Once | INTRAVENOUS | Status: AC
  Administered 2023-02-13: 2 g via INTRAVENOUS
  Filled 2023-02-13: qty 12.5

## 2023-02-13 MED ORDER — BACLOFEN 10 MG PO TABS
20.0000 mg | ORAL_TABLET | Freq: Three times a day (TID) | ORAL | Status: DC
  Administered 2023-02-14 – 2023-02-15 (×5): 20 mg via ORAL
  Filled 2023-02-13 (×6): qty 2

## 2023-02-13 MED ORDER — TIZANIDINE HCL 2 MG PO TABS
2.0000 mg | ORAL_TABLET | Freq: Once | ORAL | Status: AC
  Administered 2023-02-13: 2 mg via ORAL
  Filled 2023-02-13: qty 1

## 2023-02-13 MED ORDER — ONDANSETRON HCL 4 MG/2ML IJ SOLN
4.0000 mg | Freq: Once | INTRAMUSCULAR | Status: AC
  Administered 2023-02-13: 4 mg via INTRAVENOUS
  Filled 2023-02-13: qty 2

## 2023-02-13 MED ORDER — SENNOSIDES-DOCUSATE SODIUM 8.6-50 MG PO TABS: 1.0000 | ORAL_TABLET | Freq: Every evening | ORAL | Status: DC | PRN

## 2023-02-13 MED ORDER — TIZANIDINE HCL 2 MG PO TABS
1.0000 mg | ORAL_TABLET | Freq: Two times a day (BID) | ORAL | Status: DC | PRN
  Administered 2023-02-14: 2 mg via ORAL
  Filled 2023-02-13: qty 1

## 2023-02-13 MED ORDER — HYDROMORPHONE HCL 1 MG/ML IJ SOLN
0.5000 mg | INTRAMUSCULAR | Status: AC | PRN
  Administered 2023-02-14 (×2): 0.5 mg via INTRAVENOUS
  Filled 2023-02-13 (×2): qty 0.5

## 2023-02-13 MED ORDER — BUPROPION HCL ER (XL) 150 MG PO TB24
150.0000 mg | ORAL_TABLET | Freq: Every day | ORAL | Status: DC
  Administered 2023-02-14 – 2023-02-22 (×9): 150 mg via ORAL
  Filled 2023-02-13 (×9): qty 1

## 2023-02-13 MED ORDER — HYDROMORPHONE HCL 1 MG/ML IJ SOLN
0.5000 mg | Freq: Once | INTRAMUSCULAR | Status: AC
  Administered 2023-02-13: 0.5 mg via INTRAVENOUS
  Filled 2023-02-13: qty 0.5

## 2023-02-13 MED ORDER — ONDANSETRON HCL 4 MG PO TABS
4.0000 mg | ORAL_TABLET | Freq: Four times a day (QID) | ORAL | Status: AC | PRN
  Filled 2023-02-13: qty 1

## 2023-02-13 MED ORDER — PREGABALIN 50 MG PO CAPS
100.0000 mg | ORAL_CAPSULE | Freq: Two times a day (BID) | ORAL | Status: DC
  Administered 2023-02-14 – 2023-02-22 (×18): 100 mg via ORAL
  Filled 2023-02-13 (×18): qty 2

## 2023-02-13 MED ORDER — ONDANSETRON HCL 4 MG/2ML IJ SOLN
4.0000 mg | Freq: Four times a day (QID) | INTRAMUSCULAR | Status: AC | PRN
  Administered 2023-02-14 – 2023-02-18 (×11): 4 mg via INTRAVENOUS
  Filled 2023-02-13 (×11): qty 2

## 2023-02-13 MED ORDER — LACTATED RINGERS IV SOLN: INTRAVENOUS | Status: DC

## 2023-02-13 MED ORDER — HYDROCORTISONE 5 MG PO TABS
15.0000 mg | ORAL_TABLET | Freq: Every day | ORAL | Status: DC
  Filled 2023-02-13: qty 3

## 2023-02-13 MED ORDER — HYDROCORTISONE 10 MG PO TABS
20.0000 mg | ORAL_TABLET | Freq: Every day | ORAL | Status: AC
  Administered 2023-02-14: 20 mg via ORAL
  Filled 2023-02-13: qty 2

## 2023-02-13 MED ORDER — HYDROCORTISONE 5 MG PO TABS: 15.0000 mg | ORAL_TABLET | Freq: Every morning | ORAL | Status: DC

## 2023-02-13 MED ORDER — ENOXAPARIN SODIUM 40 MG/0.4ML IJ SOSY
40.0000 mg | PREFILLED_SYRINGE | INTRAMUSCULAR | Status: DC
  Administered 2023-02-14 – 2023-02-22 (×9): 40 mg via SUBCUTANEOUS
  Filled 2023-02-13 (×9): qty 0.4

## 2023-02-13 MED ORDER — ACETAMINOPHEN 650 MG RE SUPP: 650.0000 mg | Freq: Four times a day (QID) | RECTAL | Status: DC | PRN

## 2023-02-13 MED ORDER — SODIUM CHLORIDE 0.9 % IV SOLN
2.0000 g | Freq: Two times a day (BID) | INTRAVENOUS | Status: DC
  Administered 2023-02-14 – 2023-02-16 (×5): 2 g via INTRAVENOUS
  Filled 2023-02-13 (×5): qty 12.5

## 2023-02-13 NOTE — Assessment & Plan Note (Addendum)
Per Endocrine specialist note:  > We will taper the Hydrocortisone to 30-10 staring today. You can take your 10 mg tablets. > On 12/28/2022 decrease to 25 mg AM and 10 mg PM > On 01/04/2023 decrease to 20 mg AM and 10 mg PM > On 01/11/2023 decrease to 15 mg AM and 5 mg PM and continue current dose until see [place] in 03/26/2023.   Per patient, her physiologic dose when she is not sick is 10 mg at 8 AM and 5 mg at 3 PM.    Patient states that when she is ill, she has been told by her endocrinologist to take 20 mg at 8 AM and 10 mg at 3 PM. Hydrocortisone 20 mg with breakfast and 10 mg at 3 PM has been ordered on admission AM team to titrate down to her physiologic dosing of 10 mg at 8 AM and 5 mg at 3 PM when appropriate

## 2023-02-13 NOTE — Assessment & Plan Note (Signed)
Symptomatic support: Acetaminophen 650 mg p.o./rectal every 6 hours as needed for mild pain, fever; fentanyl 50 mcg IV every 4 hours as needed for moderate pain, 15 hours ordered; Dilaudid 0.5 mg IV every 4 hours as needed for severe pain, 15 hours ordered

## 2023-02-13 NOTE — Progress Notes (Signed)
CODE SEPSIS - PHARMACY COMMUNICATION  **Broad Spectrum Antibiotics should be administered within 1 hour of Sepsis diagnosis**  Time Code Sepsis Called/Page Received: 1736 10/29  Antibiotics Ordered:  Cefepime 2g x1  Time of 1st antibiotic administration: 1832 10/29  Additional action taken by pharmacy: Contacted Nurse at 1810  If necessary, Name of Provider/Nurse Contacted: Tina Griffiths, PharmD Pharmacy Resident  02/13/2023 5:38 PM

## 2023-02-13 NOTE — Assessment & Plan Note (Signed)
Status post cystectomy and ileal conduit Continue outpatient follow-up with nephrology, urology, endocrinology as appropriate

## 2023-02-13 NOTE — H&P (Signed)
History and Physical   Christine Chavez WUJ:[id number] DOB: 04-Sep-1980 DOA: 02/13/2023  PCP: Ardyth Man, PA-C  Outpatient Specialists: Dr. Ernestene Mention, [facility] Patient coming from: Hair salon via EMS  I have personally briefly reviewed patient's old medical records in [facility] EMR.  Chief Concern: Syncope while in chair at the salon  HPI: 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  Per nursing documentation, patient had a syncopal event while sitting in the chair at the hair salon.  Patient endorsed right flank pain, nausea.  Vitals in the ED showed temperature of 97.8, respiration rate of 18, heart rate of 98, blood pressure 130/72, SpO2 95% on room air.  Serum sodium is 135, potassium 3.7, chloride of 106, bicarb 21, BUN of 9, serum creatinine of 0.76, EGFR greater than 60, nonfasting blood glucose 95, WBC 10.9, hemoglobin 13.3, platelets of 348.  Last acid 1.3, on repeat was 1.9. UA was positive for trace leukocytes and nitrates.  ED treatment: Dilaudid 0.5 mg IV x 2, ondansetron 4 mg IV x 2 doses, Zanaflex 2 mg, cefepime 2 g IV, sodium chloride 1 L bolus. ----------------------------- At bedside, patient is able to tell me her name, age, location, current calendar year.  Patient reports that  she has been having flank pain on the right side since Friday.  She reports the pain comes in waves.  She reports she did have a fever at home 2 days ago Tmax of 101.4 and she took Tylenol.  She reports that she has been advised by her primary care doctor to come to the ED however she has been trying really hard to avoid the emergency room and another hospitalization.  She endorses nausea and vomiting, vomiting mostly everything she ate over the past weekend.  Over the last 2 days, she has been taking her Solu-Cortef sick dosing as instructed.  She did get highlights putting her hair and they did remove the foil and when she was getting ready to transition from the salon chair to the wheelchair, she felt weak and the last thing she knew was that she told her hairstylist that she did not feel well.  She woke up in the EMS bed.  She denies chest pain, shortness of breath, vision changes.  Social history: She lives at home with her husband and 3 children.  She reports that her husband is very supportive of her and is helpful.  She denies tobacco, EtOH, recreational drug use.  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  ROS: Constitutional: no weight change, + fever ENT/Mouth: no sore throat, no rhinorrhea Eyes: no eye pain, no vision changes Cardiovascular: no chest pain, no dyspnea,  no edema, no palpitations Respiratory: no cough, no sputum, no wheezing Gastrointestinal: + nausea, + vomiting, no diarrhea, no constipation Genitourinary: no urinary incontinence, no dysuria, no hematuria Musculoskeletal: no arthralgias, no myalgias Skin: no skin lesions, no pruritus, Neuro: + weakness, no  loss of consciousness, no syncope Psych: no anxiety, no depression, + decrease appetite Heme/Lymph: no bruising, no bleeding  ED Course: Discussed with emergency medicine provider, patient requiring hospitalization for chief concerns of  syncope.  Assessment/Plan  Principal Problem:   UTI (urinary tract infection) Active Problems:   Major depressive disorder, recurrent episode, moderate (HCC)   Iron deficiency anemia   Hereditary spastic paraplegia (HCC)   Neurogenic bladder   Hepatic steatosis   Wheelchair dependent   Secondary adrenal insufficiency (HCC)   Right flank pain   Assessment and Plan:  * UTI (urinary tract infection) Present on admission Complicated UTI in setting of history of neurogenic bladder status post cystectomy and ileal conduit with recurrent UTI and pyelonephritis Continue cefepime per pharmacy Add urine culture Blood cultures x 2 have been ordered, 1 set is in process and the other has a status of is collected at the time of this dictation Telemetry cardiac, inpatient  Right flank pain Symptomatic support: Acetaminophen 650 mg p.o./rectal every 6 hours as needed for mild pain, fever; fentanyl 50 mcg IV every 4 hours as needed for moderate pain, 15 hours ordered; Dilaudid 0.5 mg IV every 4 hours as needed for severe pain, 15 hours ordered  Secondary adrenal insufficiency (HCC) Per Endocrine specialist note:  > We will taper the Hydrocortisone to 30-10 staring today. You can take your 10 mg tablets. > On 12/28/2022 decrease to 25 mg AM and 10 mg PM > On 01/04/2023 decrease to 20 mg AM and 10 mg PM > On 01/11/2023 decrease to 15 mg AM and 5 mg PM and continue current dose until see [place] in 03/26/2023.   Per patient, her physiologic dose when she is not sick is 10 mg at 8 AM and 5 mg at 3 PM.    Patient states that when she is ill, she has been told by her endocrinologist to take 20 mg at 8 AM and 10 mg at 3 PM. Hydrocortisone 20 mg with breakfast and 10 mg at 3 PM has been ordered on admission AM team to titrate down to her physiologic dosing of 10 mg at 8 AM and 5 mg at 3 PM when appropriate  Neurogenic bladder Status post cystectomy and ileal conduit Continue outpatient follow-up with  nephrology, urology, endocrinology as appropriate  Hereditary spastic paraplegia [facility]) Patient endorses tolerating baclofen 20 mg 3 times daily Baclofen 20 mg 3 times daily resumed Pregabalin 100 mg twice daily resumed  Major depressive disorder, recurrent episode, moderate (HCC) Home bupropion 150 mg daily resumed  Chart reviewed.   DVT prophylaxis: Enoxaparin 40 mg subcutaneous Code Status: Full code Diet: Heart healthy Family Communication: No, patient states that her husband already knows she is in the hospital and he is just going down to the vending machine Disposition Plan: Pending clinical course Consults called: None at this time Admission status: Telemetry cardiac, inpatient  Past Medical History:  Diagnosis Date   Complication of anesthesia    ? seizures after anesthesia    Headache    Migraines    Neurogenic bladder    Renal disorder    Vision abnormalities    Past Surgical History:  Procedure Laterality Date   ANTERIOR CRUCIATE LIGAMENT REPAIR  1997   APPENDECTOMY     BLADDER REMOVAL     COLONOSCOPY WITH PROPOFOL N/A 11/11/2018   Procedure: COLONOSCOPY WITH PROPOFOL;  Surgeon: Christena Deem, MD;  Location: [facility] ENDOSCOPY;  Service: Endoscopy;  Laterality: N/A;   CYSTOSCOPY WITH STENT  PLACEMENT Right 04/17/2016   Procedure: CYSTOSCOPY WITH STENT PLACEMENT;  Surgeon: Malen Gauze, MD;  Location: [facility] ORS;  Service: Urology;  Laterality: Right;   ESOPHAGOGASTRODUODENOSCOPY (EGD) WITH PROPOFOL N/A 11/11/2018   Procedure: ESOPHAGOGASTRODUODENOSCOPY (EGD) WITH PROPOFOL;  Surgeon: Christena Deem, MD;  Location: [facility] ENDOSCOPY;  Service: Endoscopy;  Laterality: N/A;   EXPLORATORY LAPAROTOMY  1999   KIDNEY STONE SURGERY  04/2016   REVISION UROSTOMY CUTANEOUS     REVISION UROSTOMY CUTANEOUS  01/10/2018   SUPRAPUBIC CATHETER PLACEMENT  08/2017   TONSILLECTOMY     Social History:  reports that she has never smoked. She has never used smokeless tobacco.  She reports that she does not drink alcohol and does not use drugs.  Allergies  Allergen Reactions   Aspirin Shortness Of Breath, Swelling and Anaphylaxis   Ibuprofen Shortness Of Breath, Swelling and Anaphylaxis   Vibegron Anaphylaxis   Morphine And Codeine Hives   Methenamine Hippurate Rash   Family History  Problem Relation Age of Onset   Hypertension Mother    Atrial fibrillation Father    Healthy Brother    Depression Brother    Arthritis/Rheumatoid Paternal Grandmother    Healthy Brother    Family history: Family history reviewed and not pertinent.  Prior to Admission medications   Medication Sig Start Date End Date Taking? Authorizing Provider  B Complex Vitamins (VITAMIN B COMPLEX 100) INJ Inject as directed.    [provider]  baclofen (LIORESAL) 10 MG tablet Take 10 mg by mouth 3 (three) times daily. 06/17/19   [provider]  belladonna-opium (B&O SUPPRETTES) 16.2-30 MG suppository Place 30 mg rectally every 8 (eight) hours as needed for pain.    [provider]  Cyanocobalamin (VITAMIN B-12) 2500 MCG SUBL Take 2,500 mcg by mouth daily.    [provider]  cyanocobalamin 1000 MCG tablet Take by mouth.    [provider]  diazepam (VALIUM) 2 MG tablet Take 2 mg by mouth every 6 (six) hours as needed for anxiety or muscle spasms (Vaginal application for PFM spasms).    [provider]  DULoxetine (CYMBALTA) 60 MG capsule Take 60 mg by mouth daily. 12/03/20   [provider]  EPINEPHrine 0.3 mg/0.3 mL IJ SOAJ injection Inject into the muscle. 08/31/20   [provider]  HYDROmorphone (DILAUDID) 4 MG tablet Take 4 mg by mouth every 6 (six) hours as needed. 06/25/22   [provider]  linaclotide (LINZESS) 145 MCG CAPS capsule TAKE 1 CAPSULE (145 MCG TOTAL) BY MOUTH ONCE DAILY 12/19/19   [provider]  mirabegron ER (MYRBETRIQ) 50 MG TB24 tablet Take 50 mg by mouth daily.     [provider]  mirtazapine (REMERON) 30 MG tablet Take by mouth. 11/05/20   [provider]  ondansetron (ZOFRAN) 4 MG tablet Take 4 mg by mouth every 8 (eight) hours as needed for nausea or vomiting.    [provider]  oxyCODONE (OXY IR/ROXICODONE) 5 MG immediate release tablet Take 5 mg by mouth every 6 (six) hours as needed.    [provider]  pantoprazole (PROTONIX) 40 MG tablet Take 40 mg by mouth 2 (two) times daily.  08/26/18   [provider]  pregabalin (LYRICA) 75 MG capsule Takes 2 caps po q am and 2 caps po QHS 02/14/19   [provider]  promethazine (PHENERGAN) 12.5 MG tablet Take 1 tablet (12.5 mg total) by mouth every 4 (four) hours as needed  for nausea or vomiting. 01/19/17   Hildred Laser, MD  rizatriptan (MAXALT) 5 MG tablet Take 5 mg by mouth as needed.  09/21/16   [provider]  tiZANidine (ZANAFLEX) 2 MG tablet Take 2 mg by mouth at bedtime. 12/19/20   [provider]  traMADol (ULTRAM) 50 MG tablet Take 50 mg by mouth every 6 (six) hours as needed. 06/02/22   [provider]  UBRELVY 50 MG TABS Take by mouth. 11/24/20   [provider]   Physical Exam: Vitals:   02/13/23 2230 02/13/23 2254 02/13/23 2300 02/13/23 2300  BP: (!) 126/97   128/75  Pulse: 87  76   Resp: 14  18   Temp:  98.1 F (36.7 C)    TempSrc:  Oral    SpO2: 99%  97%   Weight:      Height:       Constitutional: appears age-appropriate, frail, chronically ill, NAD, calm Eyes: PERRL, lids and conjunctivae normal ENMT: Mucous membranes are moist. Posterior pharynx clear of any exudate or lesions. Age-appropriate dentition. Hearing appropriate Neck: normal, supple, no masses, no thyromegaly Respiratory: clear to auscultation bilaterally, no wheezing, no crackles. Normal respiratory effort. No accessory muscle use.  Cardiovascular: Regular rate and rhythm, no murmurs / rubs / gallops. No extremity edema. 2+ pedal pulses.  No carotid bruits.  Abdomen: no tenderness, no masses palpated, no hepatosplenomegaly. Bowel sounds positive.:  Stoma bag in place. Musculoskeletal: no clubbing / cyanosis. No joint deformity upper and lower extremities. Good ROM, no contractures, no atrophy. Normal muscle tone.  Skin: no rashes, lesions, ulcers. No induration.  Multiple abdominal surgical scars that appears well-healed and chronic Neurologic: Sensation intact. Strength 5/5 in all 4.  Psychiatric: Normal judgment and insight. Alert and oriented x 3.  Depressed mood.   EKG: independently reviewed, showing sinus tachycardia with rate of 118, QTc 510  Chest x-ray on Admission: I personally reviewed and I agree with radiologist reading as below.  CT ABDOMEN PELVIS W CONTRAST  Result Date: 02/13/2023 CLINICAL DATA:  Right flank pain, fever, history of pyelonephritis and bladder surgery EXAM: CT ABDOMEN AND PELVIS WITH CONTRAST TECHNIQUE: Multidetector CT imaging of the abdomen and pelvis was performed using the standard protocol following bolus administration of intravenous contrast. RADIATION DOSE REDUCTION: This exam was performed according to the departmental dose-optimization program which includes automated exposure control, adjustment of the mA and/or kV according to patient size and/or use of iterative reconstruction technique. CONTRAST:  OMNIPAQUE IOHEXOL 300 MG/ML  SOLN COMPARISON:  CT 01/04/2017 FINDINGS: Lower chest: No acute abnormality. Hepatobiliary: Hepatic steatosis. Normal gallbladder. No biliary dilation. Pancreas: Unremarkable. Spleen: Unremarkable. Adrenals/Urinary Tract: Normal adrenal glands. 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 No urinary calculi or hydronephrosis. No evidence of pyelonephritis. Stomach/Bowel: Normal caliber large and small bowel. No bowel wall thickening. The appendix is not visualized.Stomach is within normal limits. Vascular/Lymphatic: No significant  vascular findings are present. No enlarged abdominal or pelvic lymph nodes. Reproductive: Hysterectomy. Unremarkable left ovary. 5.2 cm cystic lesion in the expected location of the right adnexa. This abuts the ileal conduit. Other: No free intraperitoneal fluid or air. Musculoskeletal: No acute fracture. IMPRESSION: 1. 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 No urinary calculi or hydronephrosis. No evidence of pyelonephritis. 2. 5.2 cm cystic lesion in the expected location of the right adnexa. This abuts the ileal conduit. Differential considerations are ovarian cyst or patulous ileal conduit. Follow-up ultrasound in 3-6  months is recommended. 3. Hepatic steatosis. Electronically Signed   By: Minerva Fester M.D.   On: 02/13/2023 21:39   DG Chest Port 1 View  Result Date: 02/13/2023 CLINICAL DATA:  Sepsis EXAM: PORTABLE CHEST 1 VIEW COMPARISON:  None Available. FINDINGS: The heart size and mediastinal contours are within normal limits. Both lungs are clear. The visualized skeletal structures are unremarkable. IMPRESSION: No active disease. Electronically Signed   By: Sharlet Salina M.D.   On: 02/13/2023 21:17    Labs on Admission: I have personally reviewed following labs  CBC: Recent Labs  Lab 02/13/23 1544  WBC 10.9*  HGB 13.3  HCT 39.2  MCV 92.0  PLT 348   Basic Metabolic Panel: Recent Labs  Lab 02/13/23 1544  NA 135  K 3.7  CL 106  CO2 21*  GLUCOSE 95  BUN 9  CREATININE 0.76  CALCIUM 9.3   GFR: Estimated Creatinine Clearance: 88.2 mL/min (by C-G formula based on SCr of 0.76 mg/dL).  Liver Function Tests: Recent Labs  Lab 02/13/23 1544  AST 20  ALT 23  ALKPHOS 50  BILITOT 1.3*  PROT 7.7  ALBUMIN 4.2   Coagulation Profile: Recent Labs  Lab 02/13/23 1544  INR 1.0   Urine analysis:    Component Value Date/Time   COLORURINE YELLOW (A) 02/13/2023 1746   APPEARANCEUR HAZY (A) 02/13/2023 1746   APPEARANCEUR Clear 03/05/2017  1031   LABSPEC >1.046 (H) 02/13/2023 1746   PHURINE 6.0 02/13/2023 1746   GLUCOSEU NEGATIVE 02/13/2023 1746   HGBUR MODERATE (A) 02/13/2023 1746   BILIRUBINUR NEGATIVE 02/13/2023 1746   BILIRUBINUR Negative 03/05/2017 1031   KETONESUR 20 (A) 02/13/2023 1746   PROTEINUR NEGATIVE 02/13/2023 1746   NITRITE POSITIVE (A) 02/13/2023 1746   LEUKOCYTESUR TRACE (A) 02/13/2023 1746   This document was prepared using Dragon Voice Recognition software and may include unintentional dictation errors.  Dr. Sedalia Muta [facility]  If 7PM-7AM, please contact overnight-coverage provider If 7AM-7PM, please contact day attending provider [email or web address]  02/13/2023, 11:44 PM

## 2023-02-13 NOTE — Assessment & Plan Note (Signed)
Patient endorses tolerating baclofen 20 mg 3 times daily Baclofen 20 mg 3 times daily resumed Pregabalin 100 mg twice daily resumed

## 2023-02-13 NOTE — Hospital Course (Addendum)
Ms. 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  Per nursing documentation, patient had a syncopal event while sitting in the chair at the hair salon.  Patient endorsed right flank pain, nausea.  Vitals in the ED showed temperature of 97.8, respiration rate of 18, heart rate of 98, blood pressure 130/72, SpO2 95% on room air.  Serum sodium is 135, potassium 3.7, chloride of 106, bicarb 21, BUN of 9, serum creatinine of 0.76, EGFR greater than 60, nonfasting blood glucose 95, WBC 10.9, hemoglobin 13.3, platelets of 348.  Last acid 1.3, on repeat was 1.9. UA was positive for trace leukocytes and nitrates.  ED treatment: Dilaudid 0.5 mg IV x 2, ondansetron 4 mg IV x 2 doses, Zanaflex 2 mg, cefepime 2 g IV, sodium chloride 1 L bolus.

## 2023-02-13 NOTE — Sepsis Progress Note (Signed)
Elink monitoring for the code sepsis protocol.  

## 2023-02-13 NOTE — ED Triage Notes (Signed)
First nurse note: pt to ED [facility] from hair salon, was sitting in chair and had syncopal episode. C/o right flank pain and nausea, states has kidney issues.

## 2023-02-13 NOTE — Consult Note (Signed)
PHARMACY -  BRIEF ANTIBIOTIC NOTE   Pharmacy has received consult(s) for Cefepime from an ED provider.  The patient's profile has been reviewed for ht/wt/allergies/indication/available labs.    One time order(s) placed for  Cefepime 2g IV x1  Further antibiotics/pharmacy consults should be ordered by admitting physician if indicated.                       Thank you, Effie Shy 02/13/2023  6:34 PM

## 2023-02-13 NOTE — ED Notes (Signed)
Attempted to give patient's cefepime. Patient in CT at this time.

## 2023-02-13 NOTE — Progress Notes (Signed)
Pharmacy Antibiotic Note  Christine Chavez is a 42 y.o. female admitted on 02/13/2023 with UTI.  Pharmacy has been consulted for Cefepime dosing.  Plan: Cefepime 2 gm q12hr per indication & renal fxn.  Pharmacy will continue to follow and will adjust abx dosing whenever warranted.  Temp (24hrs), Avg:98.2 F (36.8 C), Min:97.7 F (36.5 C), Max:99.3 F (37.4 C)   Recent Labs  Lab 02/13/23 1544 02/13/23 1738  WBC 10.9*  --   CREATININE 0.76  --   LATICACIDVEN 1.3 1.7    Estimated Creatinine Clearance: 88.2 mL/min (by C-G formula based on SCr of 0.76 mg/dL).    Allergies  Allergen Reactions   Aspirin Shortness Of Breath, Swelling and Anaphylaxis   Ibuprofen Shortness Of Breath, Swelling and Anaphylaxis   Vibegron Anaphylaxis   Morphine And Codeine Hives   Methenamine Hippurate Rash    Antimicrobials this admission: 10/29 Cefepime >>   Microbiology results: 10/29 BCx: Pending 10/29 UCx: Pending   Thank you for allowing pharmacy to be a part of this patient's care.  Otelia Sergeant, PharmD, [facility] 02/13/2023 11:51 PM

## 2023-02-13 NOTE — Sepsis Progress Note (Signed)
Notified bedside nurse of need to administer antibiotics and fluid bolus.  

## 2023-02-13 NOTE — Assessment & Plan Note (Addendum)
Present on admission Complicated UTI in setting of history of neurogenic bladder status post cystectomy and ileal conduit with recurrent UTI and pyelonephritis Continue cefepime per pharmacy Add urine culture Blood cultures x 2 have been ordered, 1 set is in process and the other has a status of is collected at the time of this dictation Telemetry cardiac, inpatient

## 2023-02-13 NOTE — ED Provider Notes (Signed)
[facility] Provider Note    Event Date/Time   First MD Initiated Contact with Patient 02/13/23 1701     (approximate)   History   Loss of Consciousness and Flank Pain   HPI  Christine Chavez is a 42 year old female with complex past medical history including spastic paraplegia, neurogenic bladder s/p bladder resection with urostomy in place, secondary adrenal insufficiency, prior admissions for pyelonephritis presenting to the emergency department for evaluation following a syncopal episode.  Patient reports she has had flank pain and nausea for about a week.  Fever of 101.4 at home 2 days ago.  Does feel similar to when she has had pyelonephritis in the past.  Has been taking a double dose of steroids at 20 mg in the morning, 10 mg in the afternoon over the past 3 days for her sick day regimen.  No chest pain.   Reviewed discharge summary from 11/17/2022, at that time presented to [facility] with concerns for pyelonephritis though there was some concern that her symptoms could be due to hyperoxaluria rather than true infection.  Did have endocrinology recommendations for increased steroid dosing for "sick days".    Physical Exam   Triage Vital Signs: ED Triage Vitals  Encounter Vitals Group     BP 02/13/23 1531 (!) 145/104     Systolic BP Percentile --      Diastolic BP Percentile --      Pulse Rate 02/13/23 1530 (!) 103     Resp 02/13/23 1530 18     Temp 02/13/23 1530 99.3 F (37.4 C)     Temp Source 02/13/23 1634 Oral     SpO2 02/13/23 1530 95 %     Weight 02/13/23 1525 142 lb (64.4 kg)     Height 02/13/23 1525 5\' 7"  (1.702 m)     Head Circumference --      Peak Flow --      Pain Score 02/13/23 1525 7     Pain Loc --      Pain Education --      Exclude from Growth Chart --     Most recent vital signs: Vitals:   02/13/23 1747 02/13/23 2030  BP:  127/81  Pulse: 98 98  Resp: 14 (!) 28  Temp:    SpO2: 99% 100%     General: Awake,  interactive  CV:  Tachycardic with regular rhythm, normal peripheral perfusion Resp:  Variable tachypnea, lungs clear to auscultation Abd:  Soft, mild generalized tenderness without rebound or guarding, tenderness over the right flank  Neuro:  Symmetric facial movement, fluid speech   ED Results / Procedures / Treatments   Labs (all labs ordered are listed, but only abnormal results are displayed) Labs Reviewed  CBC - Abnormal; Notable for the following components:      Result Value   WBC 10.9 (*)    All other components within normal limits  COMPREHENSIVE METABOLIC PANEL - Abnormal; Notable for the following components:   CO2 21 (*)    Total Bilirubin 1.3 (*)    All other components within normal limits  URINALYSIS, W/ REFLEX TO CULTURE (INFECTION SUSPECTED) - Abnormal; Notable for the following components:   Color, Urine YELLOW (*)    APPearance HAZY (*)    Specific Gravity, Urine >1.046 (*)    Hgb urine dipstick MODERATE (*)    Ketones, ur 20 (*)    Nitrite POSITIVE (*)    Leukocytes,Ua TRACE (*)  All other components within normal limits  CULTURE, BLOOD (ROUTINE X 2)  CULTURE, BLOOD (ROUTINE X 2)  LACTIC ACID, PLASMA  LACTIC ACID, PLASMA  PROTIME-INR     EKG EKG independently reviewed interpreted by myself (ER attending) demonstrates:  EKG demonstrates sinus tachycardia at a rate of 111, PR 142, QRS 62, QTc 424, no acute ST changes  RADIOLOGY Imaging independently reviewed and interpreted by myself demonstrates:  Chest x-Zamoria Boss without focal consolidation CT abdomen pelvis without evidence of pyelonephritis, radiology reports postsurgical changes noted as well as abnormal area in the adnexal area with follow-up ultrasound in 3 to 6 months recommended by radiology  PROCEDURES:  Critical Care performed: Yes, see critical care procedure note(s)  CRITICAL CARE Performed by: Trinna Post   Total critical care time: 42 minutes  Critical care time was exclusive of  separately billable procedures and treating other patients.  Critical care was necessary to treat or prevent imminent or life-threatening deterioration.  Critical care was time spent personally by me on the following activities: development of treatment plan with patient and/or surrogate as well as nursing, discussions with consultants, evaluation of patient's response to treatment, examination of patient, obtaining history from patient or surrogate, ordering and performing treatments and interventions, ordering and review of laboratory studies, ordering and review of radiographic studies, pulse oximetry and re-evaluation of patient's condition.   Procedures   MEDICATIONS ORDERED IN ED: Medications  ondansetron (ZOFRAN) injection 4 mg (4 mg Intravenous Given 02/13/23 1743)  HYDROmorphone (DILAUDID) injection 0.5 mg (0.5 mg Intravenous Given 02/13/23 1746)  ceFEPIme (MAXIPIME) 2 g in sodium chloride 0.9 % 100 mL IVPB (0 g Intravenous Stopped 02/13/23 1902)  sodium chloride 0.9 % bolus 1,000 mL (0 mLs Intravenous Stopped 02/13/23 2049)  iohexol (OMNIPAQUE) 300 MG/ML solution 100 mL (100 mLs Intravenous Contrast Given 02/13/23 1755)  HYDROmorphone (DILAUDID) injection 0.5 mg (0.5 mg Intravenous Given 02/13/23 2050)  ondansetron (ZOFRAN) injection 4 mg (4 mg Intravenous Given 02/13/23 2213)  tiZANidine (ZANAFLEX) tablet 2 mg (2 mg Oral Given 02/13/23 2214)     IMPRESSION / MDM / ASSESSMENT AND PLAN / ED COURSE  I reviewed the triage vital signs and the nursing notes.  Differential diagnosis includes, but is not limited to, UTI, pyelonephritis, intra-abdominal abscess, other acute intra-abdominal process, arrhythmia, anemia  Patient's presentation is most consistent with acute presentation with potential threat to life or bodily function.  42 year old female presenting to the emergency department following a syncopal episode in the setting of ongoing flank pain, vomiting, fever at home.   Tachycardic and tachypneic at the time of my initial presentation.  Sepsis labs sent from triage.  Slight white blood cell count elevation at 10.9.  Electrolytes and lactate reassuring.  Blood culture sent.  Will obtain urine sample from her ileal conduit for urinalysis, but go ahead and initiate sepsis orders with 1 L of IV fluids and empiric cefepime.  Ordered for Zofran and Dilaudid for symptomatic relief.  Ultimately anticipate admission, but will obtain CT abdomen pelvis to further evaluate.  Delay in radiology read, did discuss patient's presentation and CT abdomen pelvis with Dr. Lonna Cobb with urology.  He reviewed the patient's CT and did not note any acute surgical issues.  He did feel there is limited utility in patient's urinalysis for determining infection given her ileal conduit, but did recommend IV antibiotics if concern for infection.  Did feel that she could be admitted at this facility in the absence of any acute surgical issues.  Formal radiology without evidence of pyelonephritis.  Patient initially with improvement in vital signs, but on reevaluation has recurrent tachycardia and tachypnea.  With this, do think admission for further evaluation and trend for possible urinary tract infection is appropriate.  Will reach out to hospitalist team.  Reviewed with Dr. Sedalia Muta.  She will evaluate the patient for anticipated admission.    FINAL CLINICAL IMPRESSION(S) / ED DIAGNOSES   Final diagnoses:  Urinary tract infection without hematuria, site unspecified  Syncope and collapse     Rx / DC Orders   ED Discharge Orders     None        Note:  This document was prepared using Dragon voice recognition software and may include unintentional dictation errors.   Trinna Post, MD 02/13/23 2230

## 2023-02-13 NOTE — ED Triage Notes (Addendum)
Pt arrives via [facility]. Pt reports she had a syncopal episode while at the salon. Pt reports right flank pain and nausea for about 1 week. Pt reports recurrent kidney infections and adrenal insufficiency, currently has urostomy in place. Pt is AxOx4. Pt currently reports dizziness as well. Pt reports fever of 101.4 two nights ago.

## 2023-02-13 NOTE — ED Notes (Signed)
Patient brought back to treatment room. Patient noted to be sweaty and warm. Temperature WDL at this time. Patient shaking rigorously. Patient complaining of pain in her R flank.

## 2023-02-13 NOTE — Assessment & Plan Note (Signed)
Home bupropion 150 mg daily resumed

## 2023-02-14 ENCOUNTER — Ambulatory Visit: Payer: 59

## 2023-02-14 ENCOUNTER — Other Ambulatory Visit: Payer: Self-pay | Admitting: Obstetrics

## 2023-02-14 ENCOUNTER — Encounter: Payer: Self-pay | Admitting: *Deleted

## 2023-02-14 ENCOUNTER — Encounter: Payer: Self-pay | Admitting: Internal Medicine

## 2023-02-14 ENCOUNTER — Inpatient Hospital Stay: Payer: BC Managed Care – PPO

## 2023-02-14 ENCOUNTER — Other Ambulatory Visit: Payer: Self-pay

## 2023-02-14 DIAGNOSIS — G114 Hereditary spastic paraplegia: Secondary | ICD-10-CM | POA: Diagnosis not present

## 2023-02-14 DIAGNOSIS — D509 Iron deficiency anemia, unspecified: Secondary | ICD-10-CM | POA: Diagnosis not present

## 2023-02-14 DIAGNOSIS — K760 Fatty (change of) liver, not elsewhere classified: Secondary | ICD-10-CM | POA: Diagnosis not present

## 2023-02-14 DIAGNOSIS — E2740 Unspecified adrenocortical insufficiency: Secondary | ICD-10-CM | POA: Diagnosis not present

## 2023-02-14 DIAGNOSIS — F331 Major depressive disorder, recurrent, moderate: Secondary | ICD-10-CM

## 2023-02-14 DIAGNOSIS — N390 Urinary tract infection, site not specified: Secondary | ICD-10-CM | POA: Diagnosis not present

## 2023-02-14 DIAGNOSIS — N319 Neuromuscular dysfunction of bladder, unspecified: Secondary | ICD-10-CM

## 2023-02-14 DIAGNOSIS — E2749 Other adrenocortical insufficiency: Secondary | ICD-10-CM

## 2023-02-14 DIAGNOSIS — N83201 Unspecified ovarian cyst, right side: Secondary | ICD-10-CM

## 2023-02-14 DIAGNOSIS — Z993 Dependence on wheelchair: Secondary | ICD-10-CM

## 2023-02-14 DIAGNOSIS — R109 Unspecified abdominal pain: Secondary | ICD-10-CM

## 2023-02-14 LAB — BASIC METABOLIC PANEL
Anion gap: 6 (ref 5–15)
BUN: 8 mg/dL (ref 6–20)
CO2: 20 mmol/L — ABNORMAL LOW (ref 22–32)
Calcium: 7.8 mg/dL — ABNORMAL LOW (ref 8.9–10.3)
Chloride: 111 mmol/L (ref 98–111)
Creatinine, Ser: 0.64 mg/dL (ref 0.44–1.00)
GFR, Estimated: >60 mL/min (ref >60)
Glucose, Bld: 92 mg/dL (ref 70–99)
Potassium: 3.7 mmol/L (ref 3.5–5.1)
Sodium: 137 mmol/L (ref 135–145)

## 2023-02-14 LAB — CBC
HCT: 33.7 % — ABNORMAL LOW (ref 36.0–46.0)
Hemoglobin: 11.5 g/dL — ABNORMAL LOW (ref 12.0–15.0)
MCH: 31.8 pg (ref 26.0–34.0)
MCHC: 34.1 g/dL (ref 30.0–36.0)
MCV: 93.1 fL (ref 80.0–100.0)
Platelets: 277 10*3/uL (ref 150–400)
RBC: 3.62 MIL/uL — ABNORMAL LOW (ref 3.87–5.11)
RDW: 12.9 % (ref 11.5–15.5)
WBC: 9.7 10*3/uL (ref 4.0–10.5)
nRBC: 0 % (ref 0.0–0.2)

## 2023-02-14 MED ORDER — HYDROCORTISONE 10 MG PO TABS
10.0000 mg | ORAL_TABLET | Freq: Every day | ORAL | Status: DC
  Administered 2023-02-14 – 2023-02-22 (×9): 10 mg via ORAL
  Filled 2023-02-14 (×11): qty 1

## 2023-02-14 MED ORDER — HYDROMORPHONE HCL 1 MG/ML IJ SOLN
0.5000 mg | INTRAMUSCULAR | Status: DC | PRN
  Administered 2023-02-14 – 2023-02-15 (×3): 0.5 mg via INTRAVENOUS
  Filled 2023-02-14 (×3): qty 0.5

## 2023-02-14 MED ORDER — LACTATED RINGERS IV SOLN: INTRAVENOUS | Status: DC

## 2023-02-14 MED ORDER — SODIUM CHLORIDE 0.9 % IV SOLN
12.5000 mg | Freq: Four times a day (QID) | INTRAVENOUS | Status: DC | PRN
  Administered 2023-02-14 – 2023-02-22 (×9): 12.5 mg via INTRAVENOUS
  Filled 2023-02-14 (×5): qty 12.5
  Filled 2023-02-14: qty 0.5
  Filled 2023-02-14 (×3): qty 12.5

## 2023-02-14 MED ORDER — HYDROXYZINE HCL 25 MG PO TABS
25.0000 mg | ORAL_TABLET | Freq: Four times a day (QID) | ORAL | Status: DC | PRN
  Administered 2023-02-14: 25 mg via ORAL
  Filled 2023-02-14: qty 1

## 2023-02-14 MED ORDER — HYDROXYZINE HCL 25 MG PO TABS
25.0000 mg | ORAL_TABLET | Freq: Three times a day (TID) | ORAL | Status: DC
  Administered 2023-02-14 – 2023-02-22 (×26): 25 mg via ORAL
  Filled 2023-02-14 (×26): qty 1

## 2023-02-14 MED ORDER — HYDROCORTISONE 10 MG PO TABS
20.0000 mg | ORAL_TABLET | Freq: Every day | ORAL | Status: DC
  Administered 2023-02-15 – 2023-02-22 (×8): 20 mg via ORAL
  Filled 2023-02-14 (×8): qty 2

## 2023-02-14 NOTE — Progress Notes (Signed)
Consult Note  Re Service Requesting Consult : [facility] Hospitalist team  Assessment/Recommendations:  Christine Chavez is a 42 y.o. female seen in consultation at the request of [facility] Hospitalist team. Her medical history is complex and both Urology and Gynecology have been consulted.   She was last seen at [facility] in 2022 for menorrhagia and irregular cycles, and later underwent a Hysterectomy. She still has both ovaries. An ultrasound in 2022 made note of a right sided ovarian cyst measuring 4.5 x4.5 x4.0 cms. Per the patient , she has not had problems with pain related to this cyst, but as the majority of her pain this admission has been right sided it is prudent to seek a GYN consultation.  Based on our visit with the patient today, would recommend a pelvic ultrasound, either transvaginal or transabdominal. Her urostomy is located in her lower right quadrant, complicating evaluation of the right sided pain as it potentially relates to the cyst. We will order an ultrasound to get a better view and measurements of the cyst that was seen today on her CT report.      @HPIBEGIN @:  Reason for Consult:  History of right ovarian cyst; Right sided abdominal pain   Allergies: Allergies  Allergen Reactions   Aspirin Shortness Of Breath, Swelling and Anaphylaxis   Ibuprofen Shortness Of Breath, Swelling and Anaphylaxis   Ketorolac Anaphylaxis   Morphine Hives    Itching as well. This has happened twice. Last witness w/ itchy hived all over pt's neck, check, torso, and abdomen.    Dilaudid ok  Itching as well. This has happened twice. Last witness w/ itchy hived all over pt's neck, check, torso, and abdomen.  Dilaudid ok   Nsaids Anaphylaxis   Vibegron Anaphylaxis   Bupropion Palpitations   Morphine And Codeine Hives   Iron Dextran Hives    Developed LUE urticaria about 15 minutes into iron dextran infusion.   Methenamine Hippurate Rash     Medications:  Past Surgical  History:  Procedure Laterality Date   ANTERIOR CRUCIATE LIGAMENT REPAIR  1997   APPENDECTOMY     BLADDER REMOVAL     COLONOSCOPY WITH PROPOFOL N/A 11/11/2018   Procedure: COLONOSCOPY WITH PROPOFOL;  Surgeon: Christena Deem, MD;  Location: [facility] ENDOSCOPY;  Service: Endoscopy;  Laterality: N/A;   CYSTOSCOPY WITH STENT PLACEMENT Right 04/17/2016   Procedure: CYSTOSCOPY WITH STENT PLACEMENT;  Surgeon: Malen Gauze, MD;  Location: [facility] ORS;  Service: Urology;  Laterality: Right;   ESOPHAGOGASTRODUODENOSCOPY (EGD) WITH PROPOFOL N/A 11/11/2018   Procedure: ESOPHAGOGASTRODUODENOSCOPY (EGD) WITH PROPOFOL;  Surgeon: Christena Deem, MD;  Location: [facility] ENDOSCOPY;  Service: Endoscopy;  Laterality: N/A;   EXPLORATORY LAPAROTOMY  1999   HIP SURGERY Right    KIDNEY STONE SURGERY  04/2016   REVISION UROSTOMY CUTANEOUS     REVISION UROSTOMY CUTANEOUS  01/10/2018   SUPRAPUBIC CATHETER PLACEMENT  08/2017   TONSILLECTOMY       Medical History: Past Medical History:  Diagnosis Date   Bladder retention    Complication of anesthesia    ? seizures after anesthesia    Depression    Diarrhea due to malabsorption    Dizziness    Headache    Migraines    Neurogenic bladder    Pyelonephritis    Renal disorder    Vision abnormalities    Weight loss      Surgical History: Past Surgical History:  Procedure Laterality Date   ANTERIOR CRUCIATE LIGAMENT  REPAIR  1997   APPENDECTOMY     BLADDER REMOVAL     COLONOSCOPY WITH PROPOFOL N/A 11/11/2018   Procedure: COLONOSCOPY WITH PROPOFOL;  Surgeon: Christena Deem, MD;  Location[facility]COPY;  Service: Endoscopy;  Laterality: N/A;   CYSTOSCOPY WITH STENT PLACEMENT Right 04/17/2016   Procedure: CYSTOSCOPY WITH STENT PLACEMENT;  Surgeon: Malen Gauze, MD;  Location: [facility] ORS;  Service: Urology;  Laterality: Right;   ESOPHAGOGASTRODUODENOSCOPY (EGD) WITH PROPOFOL N/A 11/11/2018   Procedure: ESOPHAGOGASTRODUODENOSCOPY (EGD) WITH  PROPOFOL;  Surgeon: Christena Deem, MD;  Location: [facility] ENDOSCOPY;  Service: Endoscopy;  Laterality: N/A;   EXPLORATORY LAPAROTOMY  1999   HIP SURGERY Right    KIDNEY STONE SURGERY  04/2016   REVISION UROSTOMY CUTANEOUS     REVISION UROSTOMY CUTANEOUS  01/10/2018   SUPRAPUBIC CATHETER PLACEMENT  08/2017   TONSILLECTOMY       Obstetric History:  OB History  Gravida Para Term Preterm AB Living  3 3 3     3   SAB IAB Ectopic Multiple Live Births          3    # Outcome Date GA Lbr Len/2nd Weight Sex Type Anes PTL Lv  3 Term     M Vag-Spont  N LIV  2 Term     F Vag-Spont   LIV  1 Term     F Vag-Spont  N LIV    GYN History: Hx of Abnormal Pap Smears no Hx of STIs: no  Social History: Social History   Socioeconomic History   Marital status: Married    Spouse name: Not on file   Number of children: Not on file   Years of education: Not on file   Highest education level: Not on file  Occupational History   Not on file  Tobacco Use   Smoking status: Never   Smokeless tobacco: Never  Vaping Use   Vaping status: Never Used  Substance and Sexual Activity   Alcohol use: No    Alcohol/week: 0.0 standard drinks of alcohol   Drug use: No   Sexual activity: Yes    Birth control/protection: None    Comment: vasectomy  Other Topics Concern   Not on file  Social History Narrative   Not on file   Social Determinants of Health   Financial Resource Strain: Low Risk  (11/08/2022)   Received from [facility]   Overall Financial Resource Strain (CARDIA)    Difficulty of Paying Living Expenses: Not very hard  Food Insecurity: No Food Insecurity (02/14/2023)   Hunger Vital Sign    Worried About Running Out of Food in the Last Year: Never true    Ran Out of Food in the Last Year: Never true  Transportation Needs: No Transportation Needs (02/14/2023)   PRAPARE - Administrator, Civil Service (Medical): No    Lack of Transportation (Non-Medical): No   Physical Activity: Sufficiently Active (12/10/2020)   Received from [facility], [facility]   Exercise Vital Sign    Days of Exercise per Week: 5 days    Minutes of Exercise per Session: 30 min  Stress: No Stress Concern Present (12/10/2020)   Received from [facility], [facility]   [employer] of Occupational Health - Occupational Stress Questionnaire    Feeling of Stress : Only a little  Social Connections: Moderately Integrated (12/10/2020)   Received from [facility],  [facility]   Social Connection and Isolation Panel [NHANES]    Frequency of Communication with Friends and Family: More than three times a week    Frequency of Social Gatherings with Friends and Family: Once a week    Attends Religious Services: More than 4 times per year    Active Member of [employer] or Organizations: No    Attends Engineer, structural: Never    Marital Status: Married    Family History: family history includes Arthritis/Rheumatoid in her paternal grandmother; Atrial fibrillation in her father; Depression in her brother; Healthy in her brother and brother; Hypertension in her mother.  Review of Systems: Review of Systems  Constitutional: Negative.   HENT: Negative.    Eyes: Negative.   Respiratory: Negative.    Cardiovascular: Negative.   Gastrointestinal:  Positive for abdominal pain, nausea and vomiting.  Genitourinary:  Positive for flank pain and urgency.  Skin: Negative.   Neurological:        Neurogenic bladder with bladder removal hx  Endo/Heme/Allergies: Negative.   Psychiatric/Behavioral:  The patient is nervous/anxious.      Objective :  Vital signs in last 24 hours: LMP 02/18/2021  LMP 02/18/2021      Physical Exam: Physical Exam Constitutional:      Appearance: Normal appearance.  HENT:     Head: Normocephalic and atraumatic.  Cardiovascular:      Rate and Rhythm: Normal rate and regular rhythm.     Pulses: Normal pulses.     Heart sounds: Normal heart sounds.  Pulmonary:     Effort: Pulmonary effort is normal.     Breath sounds: Normal breath sounds.  Abdominal:     General: Bowel sounds are normal.     Palpations: Abdomen is soft.     Tenderness: There is abdominal tenderness.     Comments: + bowel sounds all four quadrants No tenderness on light palpation. With deeper palpation, there is definite pain felt in the loewr right side of the abdomen beneath the stoma, and toward the right adnexal area. No right CVA tenderness.  Genitourinary:    Comments: deferred Musculoskeletal:        General: Normal range of motion.     Cervical back: Normal range of motion and neck supple.  Skin:    General: Skin is warm and dry.  Neurological:     General: No focal deficit present.     Mental Status: She is alert and oriented to person, place, and time.  Psychiatric:        Mood and Affect: Mood normal.        Behavior: Behavior normal.      Test Results review of 2022 pelvic ultrasound - presence of right ovarian cyst measuring 4.5 x 4.5 x 4.0 cms.  Imaging: See CT report  Plan discussed with Dr. Valentino Saxon who agrees that pelvic and transvaginal ultrasound are warranted. Order placed for ultrasound.Results to be reviewed by Dr. Valentino Saxon.  Mirna Mires, CNM  02/14/2023 7:59 PM   02/14/2023 6:34 PM

## 2023-02-14 NOTE — Consult Note (Signed)
Urology Consult  I have been asked to see the patient by Dr. Rosalia Hammers, for evaluation and management of flank pain.  Chief Complaint: LOC, flank pain, nausea, subjective fever  History of Present Illness: Christine Chavez is a 42 y.o. year old female with PMH adrenal insufficiency and hereditary spastic paraplegia with neurogenic bladder s/p cystectomy with ileal conduit with Dr. Logan Bores at [facility] in February whose postoperative course has been complicated by multiple admissions due to pyelonephritis who presented to the ED yesterday after experiencing a syncopal episode in the setting of right flank pain and nausea.  Admission labs notable for white count 10.9; hemoglobin 13.3; creatinine 0.76 (baseline 0.7); lactate 1.3; and UA with nitrites but with no pyuria, hematuria, or bacteriuria on microscopy.  On antibiotics as below.  CTAP with contrast revealed no hydronephrosis or perinephric stranding.  There is a 5.2 cm cystic lesion of the right adnexa abutting the ileal conduit.  Per chart review, there have been multiple findings of adnexal cysts on prior imaging, though without consistent laterality.  Today she reports an approximate 1 week history of right flank pain, RLQ pain, nausea, and subjective fever.  She also noticed some decreased drainage from her ileostomy around the time of symptom onset.  She delayed her stress dose steroids for a couple of days hoping that her symptoms would resolve.  She admits that her abdominal pain is a bit different with this admission that it has been in the past and is primarily describing a sharp, stabbing sensation in her RLQ that radiates into the region of her ileal conduit.  Anti-infectives (From admission, onward)    Start     Dose/Rate Route Frequency Ordered Stop   02/14/23 0800  ceFEPIme (MAXIPIME) 2 g in sodium chloride 0.9 % 100 mL IVPB        2 g 200 mL/hr over 30 Minutes Intravenous Every 12 hours 02/13/23 2351     02/13/23 1745  ceFEPIme  (MAXIPIME) 2 g in sodium chloride 0.9 % 100 mL IVPB        2 g 200 mL/hr over 30 Minutes Intravenous  Once 02/13/23 1735 02/13/23 1902       Past Medical History:  Diagnosis Date   Bladder retention    Complication of anesthesia    ? seizures after anesthesia    Depression    Diarrhea due to malabsorption    Dizziness    Headache    Migraines    Neurogenic bladder    Pyelonephritis    Renal disorder    Vision abnormalities    Weight loss     Past Surgical History:  Procedure Laterality Date   ANTERIOR CRUCIATE LIGAMENT REPAIR  1997   APPENDECTOMY     BLADDER REMOVAL     COLONOSCOPY WITH PROPOFOL N/A 11/11/2018   Procedure: COLONOSCOPY WITH PROPOFOL;  Surgeon: Christena Deem, MD;  Location: [facility] ENDOSCOPY;  Service: Endoscopy;  Laterality: N/A;   CYSTOSCOPY WITH STENT PLACEMENT Right 04/17/2016   Procedure: CYSTOSCOPY WITH STENT PLACEMENT;  Surgeon: Malen Gauze, MD;  Location: [facility] ORS;  Service: Urology;  Laterality: Right;   ESOPHAGOGASTRODUODENOSCOPY (EGD) WITH PROPOFOL N/A 11/11/2018   Procedure: ESOPHAGOGASTRODUODENOSCOPY (EGD) WITH PROPOFOL;  Surgeon: Christena Deem, MD;  Location: [facility] ENDOSCOPY;  Service: Endoscopy;  Laterality: N/A;   EXPLORATORY LAPAROTOMY  1999   HIP SURGERY Right    KIDNEY STONE SURGERY  04/2016   REVISION UROSTOMY CUTANEOUS     REVISION UROSTOMY CUTANEOUS  01/10/2018  SUPRAPUBIC CATHETER PLACEMENT  08/2017   TONSILLECTOMY      Home Medications:  Current Meds  Medication Sig   promethazine (PHENERGAN) 12.5 MG tablet Take 1 tablet (12.5 mg total) by mouth every 4 (four) hours as needed for nausea or vomiting.    Allergies:  Allergies  Allergen Reactions   Aspirin Shortness Of Breath, Swelling and Anaphylaxis   Ibuprofen Shortness Of Breath, Swelling and Anaphylaxis   Ketorolac Anaphylaxis   Vibegron Anaphylaxis   Morphine And Codeine Hives   Methenamine Hippurate Rash    Family History  Problem Relation Age of  Onset   Hypertension Mother    Atrial fibrillation Father    Healthy Brother    Depression Brother    Arthritis/Rheumatoid Paternal Grandmother    Healthy Brother     Social History:  reports that she has never smoked. She has never used smokeless tobacco. She reports that she does not drink alcohol and does not use drugs.  ROS: A complete review of systems was performed.  All systems are negative except for pertinent findings as noted.  Physical Exam:  Vital signs in last 24 hours: Temp:  [97.7 F (36.5 C)-99.3 F (37.4 C)] 98.2 F (36.8 C) (10/30 1125) Pulse Rate:  [64-149] 67 (10/30 1109) Resp:  [14-31] 16 (10/30 1109) BP: (111-146)/(61-121) 111/80 (10/30 1109) SpO2:  [95 %-100 %] 99 % (10/30 1109) Weight:  [64.4 kg] 64.4 kg (10/29 1525) Constitutional:  Alert and oriented, no acute distress HEENT: [place] AT, moist mucus membranes Cardiovascular: No clubbing, cyanosis, or edema Respiratory: Normal respiratory effort Skin: No rashes, bruises or suspicious lesions Neurologic: Grossly intact, no focal deficits, moving all 4 extremities Psychiatric: Normal mood and affect  Laboratory Data:  Recent Labs    02/13/23 1544 02/14/23 0624  WBC 10.9* 9.7  HGB 13.3 11.5*  HCT 39.2 33.7*   Recent Labs    02/13/23 1544 02/14/23 0624  NA 135 137  K 3.7 3.7  CL 106 111  CO2 21* 20*  GLUCOSE 95 92  BUN 9 8  CREATININE 0.76 0.64  CALCIUM 9.3 7.8*   Recent Labs    02/13/23 1544  INR 1.0   Urinalysis    Component Value Date/Time   COLORURINE YELLOW (A) 02/13/2023 1746   APPEARANCEUR HAZY (A) 02/13/2023 1746   APPEARANCEUR Clear 03/05/2017 1031   LABSPEC >1.046 (H) 02/13/2023 1746   PHURINE 6.0 02/13/2023 1746   GLUCOSEU NEGATIVE 02/13/2023 1746   HGBUR MODERATE (A) 02/13/2023 1746   BILIRUBINUR NEGATIVE 02/13/2023 1746   BILIRUBINUR Negative 03/05/2017 1031   KETONESUR 20 (A) 02/13/2023 1746   PROTEINUR NEGATIVE 02/13/2023 1746   NITRITE POSITIVE (A) 02/13/2023  1746   LEUKOCYTESUR TRACE (A) 02/13/2023 1746   Results for orders placed or performed during the hospital encounter of 11/11/18  SARS Coronavirus 2 (CEPHEID - Performed in [facility] hospital lab), Hosp Order     Status: None   Collection Time: 11/11/18  5:50 PM   Specimen: Nasopharyngeal Swab  Result Value Ref Range Status   SARS Coronavirus 2 NEGATIVE NEGATIVE Final    Comment: (NOTE) If result is NEGATIVE SARS-CoV-2 target nucleic acids are NOT DETECTED. The SARS-CoV-2 RNA is generally detectable in upper and lower  respiratory specimens during the acute phase of infection. The lowest  concentration of SARS-CoV-2 viral copies this assay can detect is 250  copies / mL. A negative result does not preclude SARS-CoV-2 infection  and should not be used as the sole basis  for treatment or other  patient management decisions.  A negative result may occur with  improper specimen collection / handling, submission of specimen other  than nasopharyngeal swab, presence of viral mutation(s) within the  areas targeted by this assay, and inadequate number of viral copies  (<250 copies / mL). A negative result must be combined with clinical  observations, patient history, and epidemiological information. If result is POSITIVE SARS-CoV-2 target nucleic acids are DETECTED. The SARS-CoV-2 RNA is generally detectable in upper and lower  respiratory specimens dur ing the acute phase of infection.  Positive  results are indicative of active infection with SARS-CoV-2.  Clinical  correlation with patient history and other diagnostic information is  necessary to determine patient infection status.  Positive results do  not rule out bacterial infection or co-infection with other viruses. If result is PRESUMPTIVE POSTIVE SARS-CoV-2 nucleic acids MAY BE PRESENT.   A presumptive positive result was obtained on the submitted specimen  and confirmed on repeat testing.  While 2019 novel coronavirus   (SARS-CoV-2) nucleic acids may be present in the submitted sample  additional confirmatory testing may be necessary for epidemiological  and / or clinical management purposes  to differentiate between  SARS-CoV-2 and other Sarbecovirus currently known to infect humans.  If clinically indicated additional testing with an alternate test  methodology [phone]) is advised. The SARS-CoV-2 RNA is generally  detectable in upper and lower respiratory sp ecimens during the acute  phase of infection. The expected result is Negative. Fact Sheet for Patients:  BoilerBrush.com.cy Fact Sheet for Healthcare Providers: https://pope.com/ This test is not yet approved or cleared by the [place] FDA and has been authorized for detection and/or diagnosis of SARS-CoV-2 by FDA under an Emergency Use Authorization (EUA).  This EUA will remain in effect (meaning this test can be used) for the duration of the COVID-19 declaration under Section 564(b)(1) of the Act, 21 U.S.C. section 360bbb-3(b)(1), unless the authorization is terminated or revoked sooner. Performed at [facility], [address] [place], [place] [address]     Radiologic Imaging: CT ABDOMEN PELVIS W CONTRAST  Result Date: 02/13/2023 CLINICAL DATA:  Right flank pain, fever, history of pyelonephritis and bladder surgery EXAM: CT ABDOMEN AND PELVIS WITH CONTRAST TECHNIQUE: Multidetector CT imaging of the abdomen and pelvis was performed using the standard protocol following bolus administration of intravenous contrast. RADIATION DOSE REDUCTION: This exam was performed according to the departmental dose-optimization program which includes automated exposure control, adjustment of the mA and/or kV according to patient size and/or use of iterative reconstruction technique. CONTRAST:  OMNIPAQUE IOHEXOL 300 MG/ML  SOLN COMPARISON:  CT 01/04/2017 FINDINGS: Lower chest: No acute  abnormality. Hepatobiliary: Hepatic steatosis. Normal gallbladder. No biliary dilation. Pancreas: Unremarkable. Spleen: Unremarkable. Adrenals/Urinary Tract: Normal adrenal glands. 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 No urinary calculi or hydronephrosis. No evidence of pyelonephritis. Stomach/Bowel: Normal caliber large and small bowel. No bowel wall thickening. The appendix is not visualized.Stomach is within normal limits. Vascular/Lymphatic: No significant vascular findings are present. No enlarged abdominal or pelvic lymph nodes. Reproductive: Hysterectomy. Unremarkable left ovary. 5.2 cm cystic lesion in the expected location of the right adnexa. This abuts the ileal conduit. Other: No free intraperitoneal fluid or air. Musculoskeletal: No acute fracture. IMPRESSION: 1. 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 No urinary calculi or hydronephrosis. No evidence of pyelonephritis. 2. 5.2 cm cystic lesion in the expected location of the right adnexa. This  abuts the ileal conduit. Differential considerations are ovarian cyst or patulous ileal conduit. Follow-up ultrasound in 3-6 months is recommended. 3. Hepatic steatosis. Electronically Signed   By: Minerva Fester M.D.   On: 02/13/2023 21:39   DG Chest Port 1 View  Result Date: 02/13/2023 CLINICAL DATA:  Sepsis EXAM: PORTABLE CHEST 1 VIEW COMPARISON:  None Available. FINDINGS: The heart size and mediastinal contours are within normal limits. Both lungs are clear. The visualized skeletal structures are unremarkable. IMPRESSION: No active disease. Electronically Signed   By: Sharlet Salina M.D.   On: 02/13/2023 21:17    Assessment & Plan:  42 year old female with hereditary spastic paraplegia and adrenal insufficiency with neurogenic bladder s/p cystectomy and ileal conduit in February who has since had multiple admissions for pyelonephritis, now admitted with possible complicated UTI  after syncopal episode.  Overall rather low suspicion for pyelonephritis given rather bland UA on admission and no evidence of inflammatory stranding around the kidneys on CT.  Would recommend ID consult to weigh in on this further given her complex picture.  There are some discrepancies in her chart regarding laterality of the right adnexal cyst, unclear if she has had the same cyst for many years or if these have been resolving and recurring.  Regardless, I do think it is possible that her right adnexal cyst is contributing to some of her abdominal pain at this time and would recommend GYN consult to weigh in on this.  Regardless, no indication for urgent urologic intervention at this time. -Antibiotics per primary team -Consider ID, GYN consults  Thank you for involving me in this patient's care, please page with any further questions or concerns.  Carman Ching, PA-C 02/14/2023 12:10 PM

## 2023-02-14 NOTE — Progress Notes (Signed)
Progress Note   Patient: Christine Chavez UEA:[id number] DOB: August 13, 1980 DOA: 02/13/2023     1 DOS: the patient was seen and examined on 02/14/2023   Brief hospital course: Ms. 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 She has nausea and right flank pain.  In ED, UA was positive for trace leukocytes and nitrates, she is started on IV pain medications, IV antiemetics, cefepime therapy with fluid bolus admitted to hospitalist service for further management evaluation  Assessment and Plan: * UTI (urinary tract infection) Pyelonephritis ruled out on CT scan. Urology evaluation called, not impressed with urine. She had prior recurrent UTI and pyelonephritis in the setting of cystectomy and ileal conduit. Continue cefepime per pharmacy Follow urine cultures, blood cultures.  Right flank pain Right adnexal cyst abutting ileal conduit. Urology recommended GYN evaluation. GYN consulted. Continue IV antiemetics, pain control.  Secondary adrenal insufficiency [facility]) Per Endocrine specialist note patient is on tapering dose of hydrocortisone. Discussed with pharmacy, given her sickness we will continue higher dose of hydrocortisone 20mg  a.m., 10mg  PM, her usual dose is 10am, 5 pm.  Neurogenic bladder Status post cystectomy and ileal  conduit Continue outpatient follow-up with nephrology, urology, endocrinology as appropriate  Hereditary spastic paraplegia (HCC) Continue Baclofen 20 mg 3 times daily, Pregabalin 100 mg twice daily.  Major depressive disorder, recurrent episode, moderate (HCC) Continue Home bupropion 150 mg daily.      Out of bed to chair. Incentive spirometry. Nursing supportive care. Fall, aspiration precautions. DVT prophylaxis   Code Status: Full Code  Subjective: Patient is seen and examined today morning. She has nausea, right flank pain radiating to lower abdomen. Unable to tolerate diet due to nausea. Family at bedside.  Physical Exam: Vitals:   02/14/23 0601 02/14/23 1109 02/14/23 1125 02/14/23 1230  BP: 120/61 111/80  118/88  Pulse: 64 67  84  Resp: 14 16  17   Temp: 98.1 F (36.7 C)  98.2 F (36.8 C)   TempSrc: Oral  Oral   SpO2: 95% 99%  99%  Weight:      Height:        General - Young  Caucasian female, in distress due to pain, nausea. HEENT - PERRLA, EOMI, atraumatic head, non tender sinuses. Lung - Clear, basal rales, no rhonchi, wheezes. Heart - S1, S2 heard, no murmurs, rubs, trace pedal edema. Abdomen - Soft, ileal conduit, right flank, lower abdomen tender Neuro - Alert, awake and oriented x 3, paraplegia. Skin - Warm and dry.  Data Reviewed:      Latest Ref Rng & Units 02/14/2023    6:24 AM 02/13/2023    3:44 PM 10/02/2022   12:22 PM  CBC  WBC 4.0 - 10.5 K/uL 9.7  10.9  8.8   Hemoglobin 12.0 - 15.0 g/dL 47.8  29.5  62.1   Hematocrit 36.0 - 46.0 % 33.7  39.2  34.2   Platelets 150 - 400 K/uL 277  348  331  Latest Ref Rng & Units 02/14/2023    6:24 AM 02/13/2023    3:44 PM 10/02/2022   12:22 PM  BMP  Glucose 70 - 99 mg/dL 92  95  92   BUN 6 - 20 mg/dL 8  9  11    Creatinine 0.44 - 1.00 mg/dL 9.14  7.82  9.56   Sodium 135 - 145 mmol/L 137  135  136   Potassium 3.5 - 5.1 mmol/L 3.7  3.7  3.8   Chloride 98 - 111 mmol/L 111  106  103   CO2 22 - 32  mmol/L 20  21  25    Calcium 8.9 - 10.3 mg/dL 7.8  9.3  8.7    CT ABDOMEN PELVIS W CONTRAST  Result Date: 02/13/2023 CLINICAL DATA:  Right flank pain, fever, history of pyelonephritis and bladder surgery EXAM: CT ABDOMEN AND PELVIS WITH CONTRAST TECHNIQUE: Multidetector CT imaging of the abdomen and pelvis was performed using the standard protocol following bolus administration of intravenous contrast. RADIATION DOSE REDUCTION: This exam was performed according to the departmental dose-optimization program which includes automated exposure control, adjustment of the mA and/or kV according to patient size and/or use of iterative reconstruction technique. CONTRAST:  OMNIPAQUE IOHEXOL 300 MG/ML  SOLN COMPARISON:  CT 01/04/2017 FINDINGS: Lower chest: No acute abnormality. Hepatobiliary: Hepatic steatosis. Normal gallbladder. No biliary dilation. Pancreas: Unremarkable. Spleen: Unremarkable. Adrenals/Urinary Tract: Normal adrenal glands. 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 No urinary calculi or hydronephrosis. No evidence of pyelonephritis. Stomach/Bowel: Normal caliber large and small bowel. No bowel wall thickening. The appendix is not visualized.Stomach is within normal limits. Vascular/Lymphatic: No significant vascular findings are present. No enlarged abdominal or pelvic lymph nodes. Reproductive: Hysterectomy. Unremarkable left ovary. 5.2 cm cystic lesion in the expected location of the right adnexa. This abuts the ileal conduit. Other: No free intraperitoneal fluid or air. Musculoskeletal: No acute fracture. IMPRESSION: 1. 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 No urinary calculi or hydronephrosis. No evidence of pyelonephritis. 2. 5.2 cm cystic lesion in the expected location of the right adnexa. This abuts the ileal conduit. Differential considerations are ovarian cyst or patulous ileal conduit. Follow-up ultrasound in 3-6  months is recommended. 3. Hepatic steatosis. Electronically Signed   By: Minerva Fester M.D.   On: 02/13/2023 21:39   DG Chest Port 1 View  Result Date: 02/13/2023 CLINICAL DATA:  Sepsis EXAM: PORTABLE CHEST 1 VIEW COMPARISON:  None Available. FINDINGS: The heart size and mediastinal contours are within normal limits. Both lungs are clear. The visualized skeletal structures are unremarkable. IMPRESSION: No active disease. Electronically Signed   By: Sharlet Salina M.D.   On: 02/13/2023 21:17     Family Communication: Discussed with patient, family at bedside they understand and agree. All questions answereed.    Disposition: Status is: Inpatient Remains inpatient appropriate because: syncope, nausea, pain, abnormal CT abdomen.  Planned Discharge Destination: Home with Home Health     Time spent: 42 minutes  Author: Marcelino Duster, MD 02/14/2023 3:03 PM Secure chat 7am to 7pm For on call review [email or web address].

## 2023-02-15 ENCOUNTER — Inpatient Hospital Stay: Payer: BC Managed Care – PPO

## 2023-02-15 DIAGNOSIS — D509 Iron deficiency anemia, unspecified: Secondary | ICD-10-CM | POA: Diagnosis not present

## 2023-02-15 DIAGNOSIS — G114 Hereditary spastic paraplegia: Secondary | ICD-10-CM | POA: Diagnosis not present

## 2023-02-15 DIAGNOSIS — K760 Fatty (change of) liver, not elsewhere classified: Secondary | ICD-10-CM | POA: Diagnosis not present

## 2023-02-15 DIAGNOSIS — N390 Urinary tract infection, site not specified: Secondary | ICD-10-CM | POA: Diagnosis not present

## 2023-02-15 DIAGNOSIS — R55 Syncope and collapse: Secondary | ICD-10-CM

## 2023-02-15 MED ORDER — SUMATRIPTAN SUCCINATE 50 MG PO TABS
50.0000 mg | ORAL_TABLET | Freq: Once | ORAL | Status: AC
  Administered 2023-02-15: 50 mg via ORAL
  Filled 2023-02-15: qty 1

## 2023-02-15 MED ORDER — VITAMIN B-12 1000 MCG PO TABS
2500.0000 ug | ORAL_TABLET | Freq: Every day | ORAL | Status: DC
  Administered 2023-02-16 – 2023-02-22 (×7): 2500 ug via ORAL
  Filled 2023-02-15 (×7): qty 3

## 2023-02-15 MED ORDER — BACLOFEN 10 MG PO TABS: 20.0000 mg | ORAL_TABLET | Freq: Two times a day (BID) | ORAL | Status: DC

## 2023-02-15 MED ORDER — HYDROMORPHONE HCL 1 MG/ML IJ SOLN
0.5000 mg | INTRAMUSCULAR | Status: DC | PRN
  Administered 2023-02-15 – 2023-02-20 (×23): 0.5 mg via INTRAVENOUS
  Filled 2023-02-15 (×23): qty 0.5

## 2023-02-15 MED ORDER — TIZANIDINE HCL 2 MG PO TABS
2.0000 mg | ORAL_TABLET | Freq: Every day | ORAL | Status: DC
  Administered 2023-02-16 – 2023-02-21 (×7): 2 mg via ORAL
  Filled 2023-02-15 (×8): qty 1

## 2023-02-15 MED ORDER — MIRTAZAPINE 15 MG PO TABS
15.0000 mg | ORAL_TABLET | Freq: Every day | ORAL | Status: DC
  Administered 2023-02-16 – 2023-02-21 (×4): 15 mg via ORAL
  Filled 2023-02-15 (×7): qty 1

## 2023-02-15 MED ORDER — VITAMIN D3 25 MCG (1000 UNIT) PO TABS
1000.0000 [IU] | ORAL_TABLET | Freq: Every day | ORAL | Status: DC
  Administered 2023-02-16 – 2023-02-22 (×7): 1000 [IU] via ORAL
  Filled 2023-02-15 (×14): qty 1

## 2023-02-15 MED ORDER — DULOXETINE HCL 30 MG PO CPEP
30.0000 mg | ORAL_CAPSULE | Freq: Every day | ORAL | Status: DC
  Administered 2023-02-20: 30 mg via ORAL
  Filled 2023-02-15 (×6): qty 1

## 2023-02-15 MED ORDER — TIZANIDINE HCL 2 MG PO TABS
2.0000 mg | ORAL_TABLET | Freq: Three times a day (TID) | ORAL | Status: DC | PRN
  Administered 2023-02-15 – 2023-02-20 (×2): 2 mg via ORAL
  Filled 2023-02-15 (×4): qty 1

## 2023-02-15 NOTE — Progress Notes (Signed)
Progress Note   Patient: Christine Chavez ZOX:[id number] DOB: May 15, 1980 DOA: 02/13/2023     2 DOS: the patient was seen and examined on 02/15/2023   Brief hospital course: 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 She has nausea and right flank pain.  In ED, UA was positive for trace leukocytes and nitrates, she is started on IV pain medications, IV antiemetics, cefepime therapy with fluid bolus admitted to hospitalist service for further management evaluation  Assessment and Plan: * UTI (urinary tract infection) Pyelonephritis ruled out on CT scan. Urology evaluation called, not impressed with urine. She had prior recurrent UTI and pyelonephritis in the setting of cystectomy and ileal conduit. Continue cefepime per pharmacy. Follow urine cultures, blood cultures.  Right flank pain Right adnexal cyst abutting ileal conduit. GYN evaluation appreciated. Pelvic ultrasound ordered did not reveal right adnexal cysts. Transvaginal uls ordered. Continue IV antiemetics, pain control.  Syncope Vasovagal vs dehydration Due to poor oral intake, nausea and vomiting. Gentle IV hydration. No more episodes.  Continue orthostatic vitals.  Secondary adrenal insufficiency [facility]) Per Endocrine specialist note patient is on tapering  dose of hydrocortisone. Discussed with pharmacy, given her sickness we will continue higher dose of hydrocortisone 20mg  a.m., 10mg  PM, her usual dose is 10am, 5 pm.  Neurogenic bladder Status post cystectomy and ileal conduit Continue outpatient follow-up with nephrology, urology, endocrinology as appropriate  Hereditary spastic paraplegia (HCC) Continue Baclofen 20 mg 3 times daily, Pregabalin 100 mg twice daily.  Major depressive disorder, recurrent episode, moderate (HCC) Continue Home bupropion 150 mg daily.      Out of bed to chair. Incentive spirometry. Nursing supportive care. Fall, aspiration precautions. DVT prophylaxis   Code Status: Full Code  Subjective: Patient is seen and examined today morning. She continues to have nausea, abdominal pain. Wishes to eat crackers. Offered liquids, she wishes regular diet. Family at bedside.  Physical Exam: Vitals:   02/14/23 2346 02/15/23 0129 02/15/23 0307 02/15/23 0737  BP: 100/69 104/70  109/79  Pulse: 79   66  Resp: 16 18 15    Temp: 98.7 F (37.1 C) 98.7 F (37.1 C)  97.8 F (36.6 C)  TempSrc: Oral Oral    SpO2: 97% 96%  95%  Weight:      Height:  5\' 7"  (1.702 m)      General - Young  Caucasian female, in distress due to nausea. HEENT - PERRLA, EOMI, atraumatic head, non tender sinuses. Lung - Clear, basal rales, no rhonchi, wheezes. Heart - S1, S2 heard, no murmurs, rubs, trace pedal edema. Abdomen - Soft, ileal conduit, right flank, lower abdomen tender Neuro - Alert, awake and oriented x 3, paraplegia. Skin - Warm and dry.  Data Reviewed:      Latest Ref Rng & Units 02/14/2023    6:24 AM 02/13/2023    3:44 PM 10/02/2022   12:22 PM  CBC  WBC 4.0 - 10.5 K/uL 9.7  10.9  8.8  Hemoglobin 12.0 - 15.0 g/dL 10.9  60.4  54.0   Hematocrit 36.0 - 46.0 % 33.7  39.2  34.2   Platelets 150 - 400 K/uL 277  348  331       Latest Ref Rng & Units 02/14/2023    6:24 AM 02/13/2023    3:44 PM 10/02/2022   12:22 PM   BMP  Glucose 70 - 99 mg/dL 92  95  92   BUN 6 - 20 mg/dL 8  9  11    Creatinine 0.44 - 1.00 mg/dL 9.81  1.91  4.78   Sodium 135 - 145 mmol/L 137  135  136   Potassium 3.5 - 5.1 mmol/L 3.7  3.7  3.8   Chloride 98 - 111 mmol/L 111  106  103   CO2 22 - 32 mmol/L 20  21  25    Calcium 8.9 - 10.3 mg/dL 7.8  9.3  8.7    US PELVIS (TRANSABDOMINAL ONLY)  Result Date: 02/14/2023 CLINICAL DATA:  Adnexal cyst seen on CT abdomen and pelvis 02/13/2023 EXAM: TRANSABDOMINAL ULTRASOUND OF PELVIS TECHNIQUE: Transabdominal ultrasound examination of the pelvis was performed including evaluation of the uterus, ovaries, adnexal regions, and pelvic cul-de-sac. COMPARISON:  None Available. FINDINGS: Uterus/Endometrium Surgically absent. Right ovary Measurements: 6.0 x 3.2 x 4.3 cm = volume: 51 mL. Exam is limited by transabdominal technique and large amount of overlying bowel gas. Normal appearance/no adnexal mass. Left ovary Not visualized. Other findings:  No abnormal free fluid. IMPRESSION: 1. No definite right adnexal mass noting limitations of transabdominal technique and large amount of overlying bowel gas. 2. Left ovary not visualized. 3. Status post hysterectomy. Electronically Signed   By: Minerva Fester M.D.   On: 02/14/2023 21:56   CT ABDOMEN PELVIS W CONTRAST  Result Date: 02/13/2023 CLINICAL DATA:  Right flank pain, fever, history of pyelonephritis and bladder surgery EXAM: CT ABDOMEN AND PELVIS WITH CONTRAST TECHNIQUE: Multidetector CT imaging of the abdomen and pelvis was performed using the standard protocol following bolus administration of intravenous contrast. RADIATION DOSE REDUCTION: This exam was performed according to the departmental dose-optimization program which includes automated exposure control, adjustment of the mA and/or kV according to patient size and/or use of iterative reconstruction technique. CONTRAST:  OMNIPAQUE IOHEXOL 300 MG/ML  SOLN COMPARISON:  CT 01/04/2017 FINDINGS:  Lower chest: No acute abnormality. Hepatobiliary: Hepatic steatosis. Normal gallbladder. No biliary dilation. Pancreas: Unremarkable. Spleen: Unremarkable. Adrenals/Urinary Tract: Normal adrenal glands. 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 No urinary calculi or hydronephrosis. No evidence of pyelonephritis. Stomach/Bowel: Normal caliber large and small bowel. No bowel wall thickening. The appendix is not visualized.Stomach is within normal limits. Vascular/Lymphatic: No significant vascular findings are present. No enlarged abdominal or pelvic lymph nodes. Reproductive: Hysterectomy. Unremarkable left ovary. 5.2 cm cystic lesion in the expected location of the right adnexa. This abuts the ileal conduit. Other: No free intraperitoneal fluid or air. Musculoskeletal: No acute fracture. IMPRESSION: 1. 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 No urinary calculi or hydronephrosis. No evidence of pyelonephritis. 2. 5.2 cm cystic lesion in the expected location of the right adnexa. This abuts the ileal conduit. Differential considerations are ovarian cyst or patulous ileal conduit. Follow-up ultrasound in 3-6 months is recommended. 3. Hepatic steatosis. Electronically Signed   By: Minerva Fester M.D.   On: 02/13/2023 21:39   DG Chest Port 1 View  Result Date: 02/13/2023 CLINICAL DATA:  Sepsis EXAM: PORTABLE CHEST 1 VIEW  COMPARISON:  None Available. FINDINGS: The heart size and mediastinal contours are within normal limits. Both lungs are clear. The visualized skeletal structures are unremarkable. IMPRESSION: No active disease. Electronically Signed   By: Sharlet Salina M.D.   On: 02/13/2023 21:17     Family Communication: Discussed with patient, family at bedside they understand and agree. All questions answereed.    Disposition: Status is: Inpatient Remains inpatient appropriate because: syncope, nausea, pain, abnormal CT abdomen.   Planned Discharge Destination: Home with Home Health     Time spent: 38 minutes  Author: Marcelino Duster, MD 02/15/2023 3:52 PM Secure chat 7am to 7pm For on call review [email or web address].

## 2023-02-15 NOTE — ED Notes (Signed)
Call light answered, pt asking for nausea medicine states she just vomited. RN notified.

## 2023-02-16 ENCOUNTER — Inpatient Hospital Stay: Payer: BC Managed Care – PPO

## 2023-02-16 DIAGNOSIS — D509 Iron deficiency anemia, unspecified: Secondary | ICD-10-CM | POA: Diagnosis not present

## 2023-02-16 DIAGNOSIS — G114 Hereditary spastic paraplegia: Secondary | ICD-10-CM | POA: Diagnosis not present

## 2023-02-16 DIAGNOSIS — N390 Urinary tract infection, site not specified: Secondary | ICD-10-CM | POA: Diagnosis not present

## 2023-02-16 DIAGNOSIS — K760 Fatty (change of) liver, not elsewhere classified: Secondary | ICD-10-CM | POA: Diagnosis not present

## 2023-02-16 LAB — URINE CULTURE: Culture: >=100000 — AB

## 2023-02-16 MED ORDER — DOCUSATE SODIUM 100 MG PO CAPS
100.0000 mg | ORAL_CAPSULE | Freq: Once | ORAL | Status: AC
  Administered 2023-02-16: 100 mg via ORAL
  Filled 2023-02-16: qty 1

## 2023-02-16 MED ORDER — SUMATRIPTAN SUCCINATE 50 MG PO TABS
50.0000 mg | ORAL_TABLET | Freq: Every day | ORAL | Status: DC | PRN
  Administered 2023-02-16 – 2023-02-21 (×3): 50 mg via ORAL
  Filled 2023-02-16 (×5): qty 1

## 2023-02-16 MED ORDER — SODIUM CHLORIDE 0.9 % IV SOLN
3.0000 g | Freq: Four times a day (QID) | INTRAVENOUS | Status: DC
  Administered 2023-02-16 – 2023-02-20 (×16): 3 g via INTRAVENOUS
  Filled 2023-02-16 (×17): qty 8

## 2023-02-16 MED ORDER — ACETAMINOPHEN 650 MG RE SUPP: 650.0000 mg | Freq: Four times a day (QID) | RECTAL | Status: AC | PRN

## 2023-02-16 MED ORDER — ACETAMINOPHEN 325 MG PO TABS
650.0000 mg | ORAL_TABLET | Freq: Four times a day (QID) | ORAL | Status: AC | PRN
  Administered 2023-02-18: 650 mg via ORAL
  Filled 2023-02-16: qty 2

## 2023-02-16 NOTE — Progress Notes (Signed)
Progress Note   Patient: Christine Chavez VOZ:[id number] DOB: 04-15-1981 DOA: 02/13/2023     3 DOS: the patient was seen and examined on 02/16/2023   Brief hospital course: Christine Chavez is a 42 year old female with history of hereditary spastic paraplegia, neurogenic bladder status post bladder cystectomy and ileal conduit (per OSH in February 2024) complicated by recurrent UTI and pyelonephritis, dysautonomia, history of secondary adrenal insufficiency, presence of urostomy, history of cutaneous appendicovesicostomy enterectomy, resection of small intestine, urinary retention in 2019, status post right hip arthroplasty in 2023, hyperhidrosis, anxiety, history of B12 deficiency, history of 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 She has nausea and right flank pain.  In ED, UA was positive for trace leukocytes and nitrates, she is started on IV pain medications, IV antiemetics, cefepime therapy with fluid bolus admitted to hospitalist service for further management evaluation  Assessment and Plan: * Enterococcus, Klebsiella UTI (urinary tract infection) Pyelonephritis ruled out on CT scan. Urology evaluation called, not impressed with urine. She had prior recurrent UTI and pyelonephritis in the setting of cystectomy and ileal conduit. Urine Culture/ sensitivities reviewed.  Discussed with pharmacy, started on unasyn therapy. Will get ID consult for antibiotic duration.  Right flank pain Right adnexal cyst abutting ileal conduit. GYN evaluation appreciated. Pelvic ultrasound ordered did not reveal right adnexal cysts. Transvaginal uls pending. Continue IV antiemetics, pain control.  Syncope Vasovagal vs dehydration Due to poor oral intake, nausea and vomiting. Gentle IV hydration. No more episodes.  Continue orthostatic  vitals.  Secondary adrenal insufficiency (HCC) Per endocrine specialist note patient is on tapering dose of hydrocortisone. Discussed with pharmacy, given her sickness we will continue higher dose of hydrocortisone 20mg  a.m., 10mg  PM, her usual dose is 10am, 5 pm.  Neurogenic bladder Status post cystectomy and ileal conduit Continue outpatient follow-up with nephrology, urology, endocrinology as appropriate  Hereditary spastic paraplegia (HCC) Continue Baclofen 20 mg 3 times daily, Pregabalin 100 mg twice daily.  Major depressive disorder, recurrent episode, moderate (HCC) Continue Home bupropion 150 mg daily.  Migraine headaches: Sumatriptan home dose daily PRN.    Out of bed to chair. Incentive spirometry. Nursing supportive care. Fall, aspiration precautions. DVT prophylaxis   Code Status: Full Code  Subjective: Patient is seen and examined today morning. She has nausea, abdominal pain. Eating poor. Asks for her migraine medications. Difficult IV stick asks if she can get port.  Physical Exam: Vitals:   02/16/23 0402 02/16/23 0818 02/16/23 1145 02/16/23 1636  BP:  111/86 107/69 126/75  Pulse:  93 70 73  Resp: 16 16    Temp:  97.9 F (36.6 C) 98.4 F (36.9 C)   TempSrc:  Oral Oral   SpO2:  98% 97% 97%  Weight:      Height:        General - Young  Caucasian female, in distress due to nausea. HEENT - PERRLA, EOMI, atraumatic head, non tender sinuses. Lung - Clear, basal rales, no rhonchi, wheezes. Heart - S1, S2 heard, no murmurs, rubs, trace pedal edema. Abdomen - Soft, ileal conduit, right flank, lower abdomen tender Neuro - Alert, awake and oriented x 3, paraplegia. Skin - Warm and dry.  Data Reviewed:      Latest Ref Rng & Units 02/14/2023    6:24 AM 02/13/2023    3:44 PM 10/02/2022   12:22 PM  CBC  WBC 4.0 - 10.5 K/uL 9.7  10.9  8.8   Hemoglobin 12.0 - 15.0 g/dL 91.4  78.2  95.6   Hematocrit 36.0 - 46.0 % 33.7  39.2  34.2   Platelets 150 - 400 K/uL  277  348  331       Latest Ref Rng & Units 02/14/2023    6:24 AM 02/13/2023    3:44 PM 10/02/2022   12:22 PM  BMP  Glucose 70 - 99 mg/dL 92  95  92   BUN 6 - 20 mg/dL 8  9  11    Creatinine 0.44 - 1.00 mg/dL 2.13  0.86  5.78   Sodium 135 - 145 mmol/L 137  135  136   Potassium 3.5 - 5.1 mmol/L 3.7  3.7  3.8   Chloride 98 - 111 mmol/L 111  106  103   CO2 22 - 32 mmol/L 20  21  25    Calcium 8.9 - 10.3 mg/dL 7.8  9.3  8.7    US PELVIS TRANSVAGINAL NON-OB (TV ONLY)  Result Date: 02/16/2023 CLINICAL DATA:  Right adnexal cyst seen on recent CT. EXAM: TRANSABDOMINAL AND TRANSVAGINAL ULTRASOUND OF PELVIS DOPPLER ULTRASOUND OF OVARIES TECHNIQUE: Both transabdominal and transvaginal ultrasound examinations of the pelvis were performed. Transabdominal technique was performed for global imaging of the pelvis including uterus, ovaries, adnexal regions, and pelvic cul-de-sac. It was necessary to proceed with endovaginal exam following the transabdominal exam to visualize the ovaries. Color and duplex Doppler ultrasound was utilized to evaluate blood flow to the ovaries. COMPARISON:  CT dated 02/13/2023 FINDINGS: Uterus Hysterectomy. Endometrium Hysterectomy. Right ovary Measurements: 4.0 x 3.8 x 4.5 cm = volume: 36 mL. There is a 3.7 x 4.3 x 4.0 cm complex cyst with lace-like architecture arising from the right ovary most consistent with a hemorrhagic cyst. No internal vascularity noted within this cyst. Left ovary Measurements: 2.5 x 1.8 x 2.0 cm = volume: 4.6 mL. Normal appearance/no adnexal mass. Pulsed Doppler evaluation of both ovaries demonstrates normal low-resistance arterial and venous waveforms. Other findings No abnormal free fluid. IMPRESSION: 1. Right ovarian complex/hemorrhagic cyst. Follow-up with ultrasound after 2 menstrual cycles recommended. 2. Unremarkable left ovary. 3. Hysterectomy. Electronically Signed   By: Elgie Collard M.D.   On: 02/16/2023 15:46   US PELVIC DOPPLER (TORSION R/O OR  MASS ARTERIAL FLOW)  Result Date: 02/16/2023 CLINICAL DATA:  Right adnexal cyst seen on recent CT. EXAM: TRANSABDOMINAL AND TRANSVAGINAL ULTRASOUND OF PELVIS DOPPLER ULTRASOUND OF OVARIES TECHNIQUE: Both transabdominal and transvaginal ultrasound examinations of the pelvis were performed. Transabdominal technique was performed for global imaging of the pelvis including uterus, ovaries, adnexal regions, and pelvic cul-de-sac. It was necessary to proceed with endovaginal exam following the transabdominal exam to visualize the ovaries. Color and duplex Doppler ultrasound was utilized to evaluate blood flow to the ovaries. COMPARISON:  CT dated 02/13/2023 FINDINGS: Uterus Hysterectomy. Endometrium Hysterectomy. Right ovary Measurements: 4.0 x 3.8 x 4.5 cm = volume: 36 mL. There is a 3.7 x 4.3 x 4.0 cm complex cyst with lace-like architecture arising from the right ovary most consistent with a hemorrhagic cyst. No internal vascularity noted within this cyst. Left ovary Measurements: 2.5 x 1.8 x 2.0 cm = volume: 4.6 mL. Normal appearance/no adnexal mass. Pulsed Doppler evaluation of both ovaries demonstrates normal low-resistance arterial and venous waveforms. Other findings No abnormal free fluid. IMPRESSION: 1. Right ovarian complex/hemorrhagic cyst. Follow-up with ultrasound after 2 menstrual cycles recommended. 2. Unremarkable left ovary. 3.  Hysterectomy. Electronically Signed   By: Elgie Collard M.D.   On: 02/16/2023 15:46   US PELVIS (TRANSABDOMINAL ONLY)  Result Date: 02/14/2023 CLINICAL DATA:  Adnexal cyst seen on CT abdomen and pelvis 02/13/2023 EXAM: TRANSABDOMINAL ULTRASOUND OF PELVIS TECHNIQUE: Transabdominal ultrasound examination of the pelvis was performed including evaluation of the uterus, ovaries, adnexal regions, and pelvic cul-de-sac. COMPARISON:  None Available. FINDINGS: Uterus/Endometrium Surgically absent. Right ovary Measurements: 6.0 x 3.2 x 4.3 cm = volume: 51 mL. Exam is limited by  transabdominal technique and large amount of overlying bowel gas. Normal appearance/no adnexal mass. Left ovary Not visualized. Other findings:  No abnormal free fluid. IMPRESSION: 1. No definite right adnexal mass noting limitations of transabdominal technique and large amount of overlying bowel gas. 2. Left ovary not visualized. 3. Status post hysterectomy. Electronically Signed   By: Minerva Fester M.D.   On: 02/14/2023 21:56     Family Communication: Discussed with patient, family at bedside they understand and agree. All questions answereed.  Disposition: Status is: Inpatient Remains inpatient appropriate because: IV antibiotics for UTI, Adnexal mass need further evaluation.  Planned Discharge Destination: Home with Home Health     Time spent: 37 minutes  Author: Marcelino Duster, MD 02/16/2023 5:11 PM Secure chat 7am to 7pm For on call review [email or web address].

## 2023-02-17 DIAGNOSIS — G114 Hereditary spastic paraplegia: Secondary | ICD-10-CM | POA: Diagnosis not present

## 2023-02-17 DIAGNOSIS — K760 Fatty (change of) liver, not elsewhere classified: Secondary | ICD-10-CM | POA: Diagnosis not present

## 2023-02-17 DIAGNOSIS — D509 Iron deficiency anemia, unspecified: Secondary | ICD-10-CM | POA: Diagnosis not present

## 2023-02-17 DIAGNOSIS — N390 Urinary tract infection, site not specified: Secondary | ICD-10-CM | POA: Diagnosis not present

## 2023-02-17 LAB — BASIC METABOLIC PANEL
Anion gap: 9 (ref 5–15)
BUN: 9 mg/dL (ref 6–20)
CO2: 24 mmol/L (ref 22–32)
Calcium: 8.8 mg/dL — ABNORMAL LOW (ref 8.9–10.3)
Chloride: 106 mmol/L (ref 98–111)
Creatinine, Ser: 0.71 mg/dL (ref 0.44–1.00)
GFR, Estimated: >60 mL/min (ref >60)
Glucose, Bld: 97 mg/dL (ref 70–99)
Potassium: 3.4 mmol/L — ABNORMAL LOW (ref 3.5–5.1)
Sodium: 139 mmol/L (ref 135–145)

## 2023-02-17 LAB — CBC
HCT: 36.2 % (ref 36.0–46.0)
Hemoglobin: 12.3 g/dL (ref 12.0–15.0)
MCH: 31.1 pg (ref 26.0–34.0)
MCHC: 34 g/dL (ref 30.0–36.0)
MCV: 91.4 fL (ref 80.0–100.0)
Platelets: 306 10*3/uL (ref 150–400)
RBC: 3.96 MIL/uL (ref 3.87–5.11)
RDW: 12.7 % (ref 11.5–15.5)
WBC: 9.3 10*3/uL (ref 4.0–10.5)
nRBC: 0 % (ref 0.0–0.2)

## 2023-02-17 MED ORDER — POTASSIUM CHLORIDE 20 MEQ PO PACK
40.0000 meq | PACK | Freq: Once | ORAL | Status: DC
  Filled 2023-02-17: qty 2

## 2023-02-17 MED ORDER — NYSTATIN 100000 UNIT/ML MT SUSP
5.0000 mL | Freq: Four times a day (QID) | OROMUCOSAL | Status: DC
  Administered 2023-02-17 – 2023-02-22 (×18): 500000 [IU] via OROMUCOSAL
  Filled 2023-02-17 (×19): qty 5

## 2023-02-17 MED ORDER — POTASSIUM CHLORIDE CRYS ER 20 MEQ PO TBCR
40.0000 meq | EXTENDED_RELEASE_TABLET | Freq: Once | ORAL | Status: AC
  Administered 2023-02-17: 40 meq via ORAL
  Filled 2023-02-17: qty 2

## 2023-02-17 MED ORDER — SENNOSIDES-DOCUSATE SODIUM 8.6-50 MG PO TABS
2.0000 | ORAL_TABLET | Freq: Every day | ORAL | Status: DC
  Administered 2023-02-17 – 2023-02-18 (×2): 2 via ORAL
  Filled 2023-02-17 (×3): qty 2

## 2023-02-17 MED ORDER — POLYETHYLENE GLYCOL 3350 17 G PO PACK
17.0000 g | PACK | Freq: Every day | ORAL | Status: DC
  Administered 2023-02-18 – 2023-02-20 (×3): 17 g via ORAL
  Filled 2023-02-17 (×6): qty 1

## 2023-02-17 MED ORDER — DOCUSATE SODIUM 100 MG PO CAPS
100.0000 mg | ORAL_CAPSULE | Freq: Two times a day (BID) | ORAL | Status: DC
  Administered 2023-02-17 – 2023-02-20 (×6): 100 mg via ORAL
  Filled 2023-02-17 (×9): qty 1

## 2023-02-17 NOTE — Plan of Care (Signed)
  Problem: Education: Goal: Knowledge of General Education information will improve Description: Including pain rating scale, medication(s)/side effects and non-pharmacologic comfort measures Outcome: Progressing   Problem: Clinical Measurements: Goal: Will remain free from infection Outcome: Progressing   Problem: Clinical Measurements: Goal: Respiratory complications will improve Outcome: Progressing   Problem: Clinical Measurements: Goal: Cardiovascular complication will be avoided Outcome: Progressing   Problem: Activity: Goal: Risk for activity intolerance will decrease Outcome: Progressing   Problem: Pain Management: Goal: General experience of comfort will improve Outcome: Progressing

## 2023-02-17 NOTE — Progress Notes (Signed)
Progress Note   Patient: Christine Chavez AVW:[id number] DOB: July 23, 1980 DOA: 02/13/2023     4 DOS: the patient was seen and examined on 02/17/2023   Brief hospital course: Christine Chavez is a 42 year old female with history of hereditary spastic paraplegia, neurogenic bladder status post bladder cystectomy and ileal conduit (per OSH in February 2024) complicated by recurrent UTI and pyelonephritis, dysautonomia, history of secondary adrenal insufficiency, presence of urostomy, history of cutaneous appendicovesicostomy enterectomy, resection of small intestine, urinary retention in 2019, status post right hip arthroplasty in 2023, hyperhidrosis, anxiety, history of B12 deficiency, history of 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 She has nausea and right flank pain.  In ED, UA was positive for trace leukocytes and nitrates, she is started on IV pain medications, IV antiemetics, cefepime therapy with fluid bolus admitted to hospitalist service for further management evaluation  Assessment and Plan: * Enterococcus, Klebsiella UTI (urinary tract infection) Pyelonephritis ruled out on CT scan. Urology evaluation called, not impressed with urine. She had prior recurrent UTI and pyelonephritis in the setting of cystectomy and ileal conduit. Urine Culture/ sensitivities reviewed.  Discussed with pharmacy, started on unasyn therapy. Will get ID consult for antibiotic duration on Monday.  Right flank pain Right adnexal cyst abutting ileal conduit. Urine output good. Seen by urology, no intervention recommended. GYN evaluation appreciated. Pelvic ultrasound ordered did not reveal right adnexal cysts.  Transvaginal uls reviewed shows right adnexal cyst. Possible source of pain. Continue IV antiemetics, pain control.  Syncope Vasovagal vs  dehydration Due to poor oral intake, nausea and vomiting. Gentle IV hydration. No more episodes.  Continue orthostatic vitals.  Secondary adrenal insufficiency (HCC) Per endocrine specialist note patient is on tapering dose of hydrocortisone. Discussed with pharmacy, given her sickness we will continue higher dose of hydrocortisone 20mg  a.m., 10mg  PM, her usual dose is 10am, 5 pm.  Neurogenic bladder Status post cystectomy and ileal conduit. Continue supportive care. Continue outpatient follow-up with nephrology, urology, endocrinology as appropriate  Hereditary spastic paraplegia (HCC) Continue Baclofen 20 mg 3 times daily, Pregabalin 100 mg twice daily.  Major depressive disorder, recurrent episode, moderate (HCC) Continue Home bupropion 150 mg daily.  Migraine headaches: Sumatriptan home dose daily PRN.   Started on constipation regimen. Out of bed to chair. Incentive spirometry. Nursing supportive care. Fall, aspiration precautions. DVT prophylaxis   Code Status: Full Code  Subjective: Patient is seen and examined today morning. She has nausea, abdominal pain. Wishes to eat food from outside. Tolerating clears. Has constipation.  Physical Exam: Vitals:   02/17/23 0430 02/17/23 0536 02/17/23 0925 02/17/23 1205  BP: 90/67 (!) 99/57 104/67 105/74  Pulse:   77 75  Resp:   20 20  Temp: 97.9 F (36.6 C)  97.8 F (36.6 C) 98.4 F (36.9 C)  TempSrc: Oral  Oral Oral  SpO2: 96%  96% 97%  Weight:      Height:        General - Young  Caucasian female, in distress due to nausea, pain. HEENT - PERRLA, EOMI, atraumatic head, non tender sinuses. Lung - Clear, basal rales, no rhonchi, wheezes. Heart - S1, S2 heard, no murmurs, rubs, trace pedal edema. Abdomen - Soft, ileal conduit, right flank, lower abdomen tender Neuro - Alert, awake and oriented x 3, paraplegia. Skin - Warm and dry.  Data Reviewed:  Latest Ref Rng & Units 02/17/2023    5:36 AM 02/14/2023     6:24 AM 02/13/2023    3:44 PM  CBC  WBC 4.0 - 10.5 K/uL 9.3  9.7  10.9   Hemoglobin 12.0 - 15.0 g/dL 16.1  09.6  04.5   Hematocrit 36.0 - 46.0 % 36.2  33.7  39.2   Platelets 150 - 400 K/uL 306  277  348       Latest Ref Rng & Units 02/17/2023    5:36 AM 02/14/2023    6:24 AM 02/13/2023    3:44 PM  BMP  Glucose 70 - 99 mg/dL 97  92  95   BUN 6 - 20 mg/dL 9  8  9    Creatinine 0.44 - 1.00 mg/dL 4.09  8.11  9.14   Sodium 135 - 145 mmol/L 139  137  135   Potassium 3.5 - 5.1 mmol/L 3.4  3.7  3.7   Chloride 98 - 111 mmol/L 106  111  106   CO2 22 - 32 mmol/L 24  20  21    Calcium 8.9 - 10.3 mg/dL 8.8  7.8  9.3    US PELVIS TRANSVAGINAL NON-OB (TV ONLY)  Result Date: 02/16/2023 CLINICAL DATA:  Right adnexal cyst seen on recent CT. EXAM: TRANSABDOMINAL AND TRANSVAGINAL ULTRASOUND OF PELVIS DOPPLER ULTRASOUND OF OVARIES TECHNIQUE: Both transabdominal and transvaginal ultrasound examinations of the pelvis were performed. Transabdominal technique was performed for global imaging of the pelvis including uterus, ovaries, adnexal regions, and pelvic cul-de-sac. It was necessary to proceed with endovaginal exam following the transabdominal exam to visualize the ovaries. Color and duplex Doppler ultrasound was utilized to evaluate blood flow to the ovaries. COMPARISON:  CT dated 02/13/2023 FINDINGS: Uterus Hysterectomy. Endometrium Hysterectomy. Right ovary Measurements: 4.0 x 3.8 x 4.5 cm = volume: 36 mL. There is a 3.7 x 4.3 x 4.0 cm complex cyst with lace-like architecture arising from the right ovary most consistent with a hemorrhagic cyst. No internal vascularity noted within this cyst. Left ovary Measurements: 2.5 x 1.8 x 2.0 cm = volume: 4.6 mL. Normal appearance/no adnexal mass. Pulsed Doppler evaluation of both ovaries demonstrates normal low-resistance arterial and venous waveforms. Other findings No abnormal free fluid. IMPRESSION: 1. Right ovarian complex/hemorrhagic cyst. Follow-up with ultrasound  after 2 menstrual cycles recommended. 2. Unremarkable left ovary. 3. Hysterectomy. Electronically Signed   By: Christine Chavez M.D.   On: 02/16/2023 15:46   US PELVIC DOPPLER (TORSION R/O OR MASS ARTERIAL FLOW)  Result Date: 02/16/2023 CLINICAL DATA:  Right adnexal cyst seen on recent CT. EXAM: TRANSABDOMINAL AND TRANSVAGINAL ULTRASOUND OF PELVIS DOPPLER ULTRASOUND OF OVARIES TECHNIQUE: Both transabdominal and transvaginal ultrasound examinations of the pelvis were performed. Transabdominal technique was performed for global imaging of the pelvis including uterus, ovaries, adnexal regions, and pelvic cul-de-sac. It was necessary to proceed with endovaginal exam following the transabdominal exam to visualize the ovaries. Color and duplex Doppler ultrasound was utilized to evaluate blood flow to the ovaries. COMPARISON:  CT dated 02/13/2023 FINDINGS: Uterus Hysterectomy. Endometrium Hysterectomy. Right ovary Measurements: 4.0 x 3.8 x 4.5 cm = volume: 36 mL. There is a 3.7 x 4.3 x 4.0 cm complex cyst with lace-like architecture arising from the right ovary most consistent with a hemorrhagic cyst. No internal vascularity noted within this cyst. Left ovary Measurements: 2.5 x 1.8 x 2.0 cm = volume: 4.6 mL. Normal appearance/no adnexal mass. Pulsed Doppler evaluation of both ovaries demonstrates normal low-resistance arterial and venous waveforms.  Other findings No abnormal free fluid. IMPRESSION: 1. Right ovarian complex/hemorrhagic cyst. Follow-up with ultrasound after 2 menstrual cycles recommended. 2. Unremarkable left ovary. 3. Hysterectomy. Electronically Signed   By: Christine Chavez M.D.   On: 02/16/2023 15:46     Family Communication: Discussed with patient, family at bedside they understand and agree. All questions answereed.  Disposition: Status is: Inpatient Remains inpatient appropriate because: IV antibiotics for UTI, Adnexal mass need further evaluation.  Planned Discharge Destination: Home  with Home Health     Time spent: 39 minutes  Author: Marcelino Duster, MD 02/17/2023 1:24 PM Secure chat 7am to 7pm For on call review [email or web address].

## 2023-02-18 DIAGNOSIS — K760 Fatty (change of) liver, not elsewhere classified: Secondary | ICD-10-CM | POA: Diagnosis not present

## 2023-02-18 DIAGNOSIS — N390 Urinary tract infection, site not specified: Secondary | ICD-10-CM | POA: Diagnosis not present

## 2023-02-18 DIAGNOSIS — D509 Iron deficiency anemia, unspecified: Secondary | ICD-10-CM | POA: Diagnosis not present

## 2023-02-18 DIAGNOSIS — G114 Hereditary spastic paraplegia: Secondary | ICD-10-CM | POA: Diagnosis not present

## 2023-02-18 LAB — CULTURE, BLOOD (ROUTINE X 2): Culture: NO GROWTH

## 2023-02-18 NOTE — Progress Notes (Signed)
Progress Note   Patient: Christine Chavez YQI:[id number] DOB: 1981/04/04 DOA: 02/13/2023     5 DOS: the patient was seen and examined on 02/18/2023   Brief hospital course: Ms. 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 She has nausea and right flank pain.  In ED, UA was positive for trace leukocytes and nitrates, she is started on IV pain medications, IV antiemetics, cefepime therapy with fluid bolus admitted to hospitalist service for further management evaluation  Assessment and Plan: * Enterococcus, Klebsiella UTI (urinary tract infection) Pyelonephritis ruled out on CT scan. Urology evaluation called, not impressed with urine. She had prior recurrent UTI and pyelonephritis in the setting of cystectomy and ileal conduit. Urine Culture/ sensitivities reviewed.  Continue unasyn therapy. ID consult for antibiotic duration on Monday.  Right flank pain Right adnexal cyst abutting ileal conduit. Urine output good. Seen by urology, no intervention recommended. GYN evaluation appreciated. Pelvic ultrasound ordered did not reveal right adnexal cysts.  Transvaginal uls reviewed shows right adnexal cyst. Possible source of pain. Continue IV antiemetics, pain control.  Syncope Vasovagal vs dehydration Due to poor oral intake,  nausea and vomiting. Gentle IV hydration. Encourage oral fluids. No more episodes.  Continue orthostatic vitals.  Secondary adrenal insufficiency (HCC) Per endocrine specialist note patient is on tapering dose of hydrocortisone. Discussed with pharmacy, given her sickness we will continue higher dose of hydrocortisone 20mg  a.m., 10mg  PM, her usual dose is 10am, 5 pm.  Neurogenic bladder Status post cystectomy and ileal conduit. Continue supportive care. Continue outpatient follow-up with nephrology, urology, endocrinology as appropriate  Hereditary spastic paraplegia (HCC) Continue Baclofen 20 mg 3 times daily, Pregabalin 100 mg twice daily.  Major depressive disorder, recurrent episode, moderate (HCC) Continue Home bupropion 150 mg daily.  Migraine headaches: Sumatriptan home dose daily PRN.   Continue constipation regimen. Out of bed to chair. Incentive spirometry. Nursing supportive care. Fall, aspiration precautions. DVT prophylaxis   Code Status: Full Code  Subjective: Patient is seen and examined today morning. Has constipation. Still nauseous, has abdominal pain. Eating poor.  Physical Exam: Vitals:   02/18/23 0000 02/18/23 0355 02/18/23 0858 02/18/23 1248  BP: 102/66 (!) 88/63 97/68 93/64   Pulse: 72 67 64 70  Resp: 16 16 20 20   Temp: 97.9 F (36.6 C) 97.8 F (36.6 C) 98.3 F (36.8 C) 98.2 F (36.8 C)  TempSrc: Oral Oral Oral Oral  SpO2:  96% 98% 98%  Weight:      Height:        General - Young  Caucasian female, mild distress due to nausea, pain. HEENT - PERRLA, EOMI, atraumatic head, non tender sinuses. Lung - Clear, basal rales, no rhonchi, wheezes. Heart - S1, S2 heard, no murmurs, rubs, trace pedal edema. Abdomen - Soft, ileal conduit, right flank, lower abdomen tender Neuro - Alert, awake and oriented x 3, paraplegia. Skin - Warm and dry.  Data Reviewed:      Latest Ref Rng & Units  02/17/2023    5:36 AM 02/14/2023    6:24 AM 02/13/2023     3:44 PM  CBC  WBC 4.0 - 10.5 K/uL 9.3  9.7  10.9   Hemoglobin 12.0 - 15.0 g/dL 66.4  40.3  47.4   Hematocrit 36.0 - 46.0 % 36.2  33.7  39.2   Platelets 150 - 400 K/uL 306  277  348       Latest Ref Rng & Units 02/17/2023    5:36 AM 02/14/2023    6:24 AM 02/13/2023    3:44 PM  BMP  Glucose 70 - 99 mg/dL 97  92  95   BUN 6 - 20 mg/dL 9  8  9    Creatinine 0.44 - 1.00 mg/dL 2.59  5.63  8.75   Sodium 135 - 145 mmol/L 139  137  135   Potassium 3.5 - 5.1 mmol/L 3.4  3.7  3.7   Chloride 98 - 111 mmol/L 106  111  106   CO2 22 - 32 mmol/L 24  20  21    Calcium 8.9 - 10.3 mg/dL 8.8  7.8  9.3    No results found.   Family Communication: Discussed with patient, family at bedside they understand and agree. All questions answereed.  Disposition: Status is: Inpatient Remains inpatient appropriate because: IV antibiotics for UTI, Adnexal mass evaluation.  Planned Discharge Destination: Home with Home Health     Time spent: 37 minutes  Author: Marcelino Duster, MD 02/18/2023 2:36 PM Secure chat 7am to 7pm For on call review [email or web address].

## 2023-02-18 NOTE — Plan of Care (Signed)

## 2023-02-18 NOTE — Plan of Care (Signed)
Progressing towards goals

## 2023-02-18 NOTE — TOC Progression Note (Signed)
Transition of Care [facility]) - Progression Note    Patient Details  Name: Christine Chavez MRN: [id number] Date of Birth: [birth year]  Transition of Care [facility]) CM/SW Contact  Bing Quarry, RN Phone Number: 02/18/2023, 3:21 PM  Transition of Care [facility]) - Inpatient Brief Assessment   Patient Details  Name: Christine Chavez MRN: [id number] Date of Birth: 01-Jan-1981  Transition of Care [facility]) CM/SW Contact:    Bing Quarry, RN Phone Number: 02/18/2023, 3:22 PM   Clinical Narrative: 11/3: Patient admitted 10/29 after syncopal episode at hair salon. Htx of hereditary spastic paraplegia.Status post cystectomy and ileal conduit, Complex medical history. UA was +, started on IV Abx.  TOC to follow for discharge needs.   Gabriel Cirri MSN RN CM  Care Management Department.  [place]  [facility] Direct Dial: [phone] Main Office Phone: [phone] Weekends Only      Transition of Care Asessment: Insurance and Status: Insurance coverage has been reviewed Patient has primary care physician: Yes   Prior level of function:: herditary spastic paraplegia   Social Determinants of Health Reivew: SDOH reviewed no interventions necessary Readmission risk has been reviewed: Yes Transition of care needs: transition of care needs identified, TOC will continue to follow       Expected Discharge Plan and Services                                               Social Determinants of Health (SDOH) Interventions SDOH Screenings   Food Insecurity: No Food Insecurity (02/14/2023)  Housing: Low Risk  (02/14/2023)  Transportation Needs: No Transportation Needs (02/14/2023)  Utilities: Not At Risk (02/14/2023)  Depression (PHQ2-9): Low Risk  (10/02/2022)  Financial Resource Strain: Low Risk  (11/08/2022)   Received from [facility]  Physical Activity: Sufficiently Active (12/10/2020)   Received from [facility], [facility]  Social Connections: Moderately Integrated (12/10/2020)   Received from [facility], [facility]  Stress: No Stress Concern Present (12/10/2020)   Received from [facility], [facility]  Tobacco Use: Low Risk  (02/14/2023)    Readmission Risk Interventions     No data to display

## 2023-02-19 ENCOUNTER — Ambulatory Visit: Payer: 59

## 2023-02-19 DIAGNOSIS — G114 Hereditary spastic paraplegia: Secondary | ICD-10-CM | POA: Diagnosis not present

## 2023-02-19 DIAGNOSIS — N319 Neuromuscular dysfunction of bladder, unspecified: Secondary | ICD-10-CM | POA: Diagnosis not present

## 2023-02-19 DIAGNOSIS — Z515 Encounter for palliative care: Secondary | ICD-10-CM | POA: Diagnosis not present

## 2023-02-19 DIAGNOSIS — R55 Syncope and collapse: Secondary | ICD-10-CM | POA: Diagnosis not present

## 2023-02-19 DIAGNOSIS — D509 Iron deficiency anemia, unspecified: Secondary | ICD-10-CM | POA: Diagnosis not present

## 2023-02-19 DIAGNOSIS — K760 Fatty (change of) liver, not elsewhere classified: Secondary | ICD-10-CM | POA: Diagnosis not present

## 2023-02-19 DIAGNOSIS — N390 Urinary tract infection, site not specified: Secondary | ICD-10-CM | POA: Diagnosis not present

## 2023-02-19 DIAGNOSIS — B952 Enterococcus as the cause of diseases classified elsewhere: Secondary | ICD-10-CM

## 2023-02-19 LAB — WET PREP, GENITAL
Clue Cells Wet Prep HPF POC: NONE SEEN
Sperm: NONE SEEN
Trich, Wet Prep: NONE SEEN
WBC, Wet Prep HPF POC: <10 (ref <10)
Yeast Wet Prep HPF POC: NONE SEEN

## 2023-02-19 MED ORDER — ONDANSETRON HCL 4 MG/2ML IJ SOLN
4.0000 mg | Freq: Four times a day (QID) | INTRAMUSCULAR | Status: DC | PRN
  Administered 2023-02-19 – 2023-02-20 (×4): 4 mg via INTRAVENOUS
  Filled 2023-02-19 (×4): qty 2

## 2023-02-19 MED ORDER — ACETAMINOPHEN 325 MG PO TABS: 650.0000 mg | ORAL_TABLET | Freq: Four times a day (QID) | ORAL | Status: DC | PRN

## 2023-02-19 NOTE — Consult Note (Signed)
Consultation Note Date: 02/19/2023 at 1215  Patient Name: Christine Chavez  DOB: [birth year]  MRN: [id number]  Age / Sex: 42 y.o., female  PCP: Ardyth Man, PA-C Referring Physician: Marcelino Duster, MD  HPI/Patient Profile: 42 y.o. 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  Patient is being treated for dehydration, right flank pain, and Enterococcus Klebsiella UTI with IV pain medications and antiemetics, antibiotics, and IVF.  Ultrasound revealed right adnexal cysts abutting of the ileal conduit.  ID and gynecology consults are following.  PMT was consulted on day 5 of hospitalization for "pain control".  Clinical Assessment and Goals of Care: Extensive chart review completed prior to meeting patient including labs, vital signs, imaging, progress notes, orders, and available advanced directive documents from current and previous encounters. I then met with patient and her friend to discuss diagnosis prognosis, GOC, EOL wishes, disposition and options.  I introduced Palliative Medicine as specialized medical care for people living with serious illness. It focuses on providing relief from the symptoms and stress of a serious illness. The goal is to improve quality  of life for both the patient and the family.  We discussed a brief life review of the patient.  Patient is married and a mother of 3 children.  She has been dealing with this chronic illness for the past 8 years. Extensive discussion of her medical history and experience of her disease and treatments at varying facilities. Therapeutic silence, active listening, and emotional support provided as patient shares her thoughts and emotions regarding her current medical situation.  We discussed patient's current illness and what it means in the larger context of patient's on-going co-morbidities.  Discussed use of medications to alleviate pain.  Discussed difference between addiction and dependence.  Quality of life versus existing/living reviewed.   I attempted to elicit values and goals of care important to the patient.  Patient is accepting of all offered, available, and appropriate medical interventions to sustain her life.  Full code and full scope remain.  As per chart review, patient has chronic pain managed by [facility], last visit 02/08/23. Their recommendations at that time were as follows:  Plan to continue patient on Hydromorphone 2 mg 1-2 times daily.  Current medications Pregabalin 100/100/200 mg Tizanidine 2 mg nightly Baclofen 20 mg TID  Discussed current regimen and how well it is managing patient's pain.  Discussed nausea as a side effect of pain.  Zofran adjusted to 3 times daily with 1 dose being able to  be given throughout the night as needed.  Patient endorses that her medications are minimizing her pain well at the moment when utilized.   Outpatient palliative services discussed in detail.  Patient is accepting of outpatient referral.  Referral placed for TOC to offer choice of outpatient palliative services at discharge.   Discussed with patient/friend the importance of continued conversation with family and the medical providers regarding overall plan of care and  treatment options, ensuring decisions are within the context of the patient's values and GOCs.    I also shared that palliative medicine will continue to be an advocate and support patient throughout her hospitalization.  Primary Decision Maker PATIENT  Physical Exam Constitutional:      General: She is not in acute distress.    Appearance: She is normal weight. She is not ill-appearing.  HENT:     Head: Normocephalic.     Mouth/Throat:     Mouth: Mucous membranes are moist.  Eyes:     Pupils: Pupils are equal, round, and reactive to light.  Cardiovascular:     Rate and Rhythm: Normal rate.     Pulses: Normal pulses.  Pulmonary:     Effort: Pulmonary effort is normal.  Abdominal:     Palpations: Abdomen is soft.  Skin:    General: Skin is warm and dry.  Neurological:     Mental Status: She is alert and oriented to person, place, and time.  Psychiatric:        Mood and Affect: Mood normal.        Behavior: Behavior normal.        Thought Content: Thought content normal.        Judgment: Judgment normal.     Palliative Assessment/Data:  40%     Thank you for this consult. Palliative medicine will continue to follow and assist holistically.   Time Total: 120 minutes  Time spent includes: Detailed review of medical records (labs, imaging, vital signs), medically appropriate exam (mental status, respiratory, cardiac, skin), discussed with treatment team, counseling and educating patient, family and staff, documenting clinical information, medication management and coordination of care.  Signed by: Georgiann Cocker, DNP, FNP-BC Palliative Medicine   Please contact Palliative Medicine Team providers via [facility] for questions and concerns.

## 2023-02-19 NOTE — Consult Note (Signed)
NAME: Christine Chavez  DOB: 09/02/1980  MRN: [id number]  Date/Time: 02/19/2023 4:23 PM  REQUESTING PROVIDER: Dr.Sreeram Subjective:  REASON FOR CONSULT: UTI ? Christine Chavez is a 42 y.o. with a history of neurogenic bladder, s/p ileal conduit and cystectomy done in Feb 2024 , secondary adrenal insufficiency, dysautonomia, frequent UTI hereditary spastic paraplegia presented with weakness,  syncope when she was at the [employer] on 02/13/23 She had some sharp rt flank pain  02/13/23  BP 128/75  Temp 98.1 F (36.7 C)  Pulse Rate 76  Resp 18  SpO2 97 %     Latest Reference Range & Units 02/13/23  WBC 4.0 - 10.5 K/uL 10.9 (H)  Hemoglobin 12.0 - 15.0 g/dL 86.5  HCT 78.4 - 69.6 % 39.2  Platelets 150 - 400 K/uL 348  Creatinine 0.44 - 1.00 mg/dL 2.95   BC sent urine analysis no wbc- UC was enterococcus fecalis and klebsiella She was started on IV cefepime CT abd/pelvis showed rt ovarian cyst abuting on the ileal conduit She is now on unasyn and I am asked to see her for management of UTI    Past Medical History:  Diagnosis Date   Bladder retention    Complication of anesthesia    ? seizures after anesthesia    Depression    Diarrhea due to malabsorption    Dizziness    Headache    Migraines    Neurogenic bladder    Pyelonephritis    Renal disorder    Vision abnormalities    Weight loss     Past Surgical History:  Procedure Laterality Date   ANTERIOR CRUCIATE LIGAMENT REPAIR  1997   APPENDECTOMY     BLADDER REMOVAL     COLONOSCOPY WITH PROPOFOL N/A 11/11/2018   Procedure: COLONOSCOPY WITH PROPOFOL;  Surgeon: Christena Deem, MD;  Location: [facility] ENDOSCOPY;  Service: Endoscopy;  Laterality: N/A;   CYSTOSCOPY WITH STENT PLACEMENT Right 04/17/2016   Procedure: CYSTOSCOPY WITH STENT PLACEMENT;  Surgeon: Malen Gauze, MD;  Location: [facility] ORS;  Service: Urology;  Laterality: Right;   ESOPHAGOGASTRODUODENOSCOPY (EGD) WITH PROPOFOL N/A 11/11/2018   Procedure:  ESOPHAGOGASTRODUODENOSCOPY (EGD) WITH PROPOFOL;  Surgeon: Christena Deem, MD;  Location: [facility] ENDOSCOPY;  Service: Endoscopy;  Laterality: N/A;   EXPLORATORY LAPAROTOMY  1999   HIP SURGERY Right    KIDNEY STONE SURGERY  04/2016   REVISION UROSTOMY CUTANEOUS     REVISION UROSTOMY CUTANEOUS  01/10/2018   SUPRAPUBIC CATHETER PLACEMENT  08/2017   TONSILLECTOMY      Social History   Socioeconomic History   Marital status: Married    Spouse name: Not on file   Number of children: Not on file   Years of education: Not on file   Highest education level: Not on file  Occupational History   Not on file  Tobacco Use   Smoking status: Never   Smokeless tobacco: Never  Vaping Use   Vaping status: Never Used  Substance and Sexual Activity   Alcohol use: No    Alcohol/week: 0.0 standard drinks of alcohol   Drug use: No   Sexual activity: Yes    Birth control/protection: None    Comment: vasectomy  Other Topics Concern   Not on file  Social History Narrative   Not on file   Social Determinants of Health   Financial Resource Strain: Low Risk  (11/08/2022)   Received from [facility]   Overall Financial Resource Strain (CARDIA)  Difficulty of Paying Living Expenses: Not very hard  Food Insecurity: No Food Insecurity (02/14/2023)   Hunger Vital Sign    Worried About Running Out of Food in the Last Year: Never true    Ran Out of Food in the Last Year: Never true  Transportation Needs: No Transportation Needs (02/14/2023)   PRAPARE - Administrator, Civil Service (Medical): No    Lack of Transportation (Non-Medical): No  Physical Activity: Sufficiently Active (12/10/2020)   Received from [facility], [facility]   Exercise Vital Sign    Days of Exercise per Week: 5 days    Minutes of Exercise per Session: 30 min  Stress: No Stress Concern Present (12/10/2020)   Received from [facility], [facility]   [employer] of Occupational Health - Occupational Stress Questionnaire    Feeling of Stress : Only a little  Social Connections: Moderately Integrated (12/10/2020)   Received from [facility], [facility]   Social Connection and Isolation Panel [NHANES]    Frequency of Communication with Friends and Family: More than three times a week    Frequency of Social Gatherings with Friends and Family: Once a week    Attends Religious Services: More than 4 times per year    Active Member of [employer] or Organizations: No    Attends Banker Meetings: Never    Marital Status: Married  Catering manager Violence: Not At Risk (02/14/2023)   Humiliation, Afraid, Rape, and Kick questionnaire    Fear of Current or Ex-Partner: No    Emotionally Abused: No    Physically Abused: No    Sexually Abused: No    Family History  Problem Relation Age of Onset   Hypertension Mother    Atrial fibrillation Father    Healthy Brother    Depression Brother    Arthritis/Rheumatoid Paternal Grandmother    Healthy Brother    Allergies  Allergen Reactions   Aspirin Shortness Of Breath, Swelling and Anaphylaxis   Ibuprofen Shortness Of Breath, Swelling and Anaphylaxis   Ketorolac Anaphylaxis   Morphine Hives    Itching as well. This has happened twice. Last witness w/ itchy hived all over pt's neck, check, torso, and abdomen.    Dilaudid ok  Itching as well. This has happened twice. Last witness w/ itchy hived all over pt's neck, check, torso, and abdomen.  Dilaudid ok   Nsaids Anaphylaxis   Vibegron Anaphylaxis   Bupropion Palpitations   Morphine And Codeine Hives   Iron Dextran Hives    Developed LUE urticaria about 15 minutes into iron dextran infusion.   Methenamine Hippurate Rash   I? Current Facility-Administered Medications  Medication Dose Route Frequency Provider Last Rate Last Admin   acetaminophen (TYLENOL) tablet 650  mg  650 mg Oral Q6H PRN Marcelino Duster, MD       Ampicillin-Sulbactam (UNASYN) 3 g in sodium chloride 0.9 % 100 mL IVPB  3 g Intravenous Q6H Sreeram, Narendranath, MD 200 mL/hr at 02/19/23 1305 3 g at 02/19/23 1305   buPROPion (WELLBUTRIN XL) 24 hr tablet 150 mg  150 mg Oral Daily Cox, Amy N, DO   150 mg at 02/19/23 5409   cholecalciferol (VITAMIN D3) tablet 1,000 Units  1,000 Units Oral Daily Marcelino Duster, MD   1,000 Units at 02/19/23 0913   cyanocobalamin (VITAMIN B12) tablet 2,500 mcg  2,500 mcg Oral Daily Marcelino Duster, MD  2,500 mcg at 02/19/23 0913   docusate sodium (COLACE) capsule 100 mg  100 mg Oral BID Marcelino Duster, MD   100 mg at 02/19/23 0913   DULoxetine (CYMBALTA) DR capsule 30 mg  30 mg Oral Daily Sreeram, Lynne Logan, MD       enoxaparin (LOVENOX) injection 40 mg  40 mg Subcutaneous Q24H Cox, Amy N, DO   40 mg at 02/19/23 0916   hydrocortisone (CORTEF) tablet 10 mg  10 mg Oral Q1500 Marcelino Duster, MD   10 mg at 02/19/23 1600   hydrocortisone (CORTEF) tablet 20 mg  20 mg Oral Daily Marcelino Duster, MD   20 mg at 02/19/23 0913   HYDROmorphone (DILAUDID) injection 0.5 mg  0.5 mg Intravenous Q4H PRN Marcelino Duster, MD   0.5 mg at 02/19/23 1258   hydrOXYzine (ATARAX) tablet 25 mg  25 mg Oral TID Andris Baumann, MD   25 mg at 02/19/23 1544   mirtazapine (REMERON) tablet 15 mg  15 mg Oral QHS Marcelino Duster, MD   15 mg at 02/16/23 2208   nystatin (MYCOSTATIN) 100000 UNIT/ML suspension 500,000 Units  5 mL Mouth/Throat QID Marcelino Duster, MD   500,000 Units at 02/19/23 1544   ondansetron (ZOFRAN) injection 4 mg  4 mg Intravenous Q6H PRN Marcelino Duster, MD   4 mg at 02/19/23 1549   pantoprazole (PROTONIX) EC tablet 40 mg  40 mg Oral QAC breakfast Cox, Amy N, DO   40 mg at 02/19/23 0913   polyethylene glycol (MIRALAX / GLYCOLAX) packet 17 g  17 g Oral Daily Marcelino Duster, MD   17 g at 02/19/23 0915    pregabalin (LYRICA) capsule 100 mg  100 mg Oral BID Cox, Amy N, DO   100 mg at 02/19/23 0913   promethazine (PHENERGAN) 12.5 mg in sodium chloride 0.9 % 50 mL IVPB  12.5 mg Intravenous Q6H PRN Marcelino Duster, MD 150 mL/hr at 02/19/23 0409 Infusion Verify at 02/19/23 0409   senna-docusate (Senokot-S) tablet 2 tablet  2 tablet Oral QHS Marcelino Duster, MD   2 tablet at 02/18/23 2123   SUMAtriptan (IMITREX) tablet 50 mg  50 mg Oral Daily PRN Marcelino Duster, MD   50 mg at 02/16/23 1446   tiZANidine (ZANAFLEX) tablet 2 mg  2 mg Oral TID PRN Marcelino Duster, MD   2 mg at 02/15/23 1605   tiZANidine (ZANAFLEX) tablet 2 mg  2 mg Oral QHS Marcelino Duster, MD   2 mg at 02/18/23 2125     Abtx:  Anti-infectives (From admission, onward)    Start     Dose/Rate Route Frequency Ordered Stop   02/16/23 1200  Ampicillin-Sulbactam (UNASYN) 3 g in sodium chloride 0.9 % 100 mL IVPB        3 g 200 mL/hr over 30 Minutes Intravenous Every 6 hours 02/16/23 1052     02/14/23 0800  ceFEPIme (MAXIPIME) 2 g in sodium chloride 0.9 % 100 mL IVPB  Status:  Discontinued        2 g 200 mL/hr over 30 Minutes Intravenous Every 12 hours 02/13/23 2351 02/16/23 1052   02/13/23 1745  ceFEPIme (MAXIPIME) 2 g in sodium chloride 0.9 % 100 mL IVPB        2 g 200 mL/hr over 30 Minutes Intravenous  Once 02/13/23 1735 02/13/23 1902       REVIEW OF SYSTEMS:  Const: negative fever, negative chills, negative weight loss Eyes: negative diplopia or visual changes, negative eye pain ENT: negative coryza,  negative sore throat Resp: negative cough, hemoptysis, dyspnea Cards: negative for chest pain, palpitations, lower extremity edema GU: negative for frequency, dysuria and hematuria GI: + abdominal pain, no diarrhea, bleeding, has constipation Skin: negative for rash and pruritus Heme: negative for easy bruising and gum/nose bleeding ZO:XWRUEA weakness Neurolo:spasms legs  Psych: f anxiety, depression   Endocrine: negative for thyroid, diabetes Allergy/Immunology- multiple allergies Objective:  VITALS:  BP 110/77 (BP Location: Left Arm)   Pulse 70   Temp 98 F (36.7 C) (Oral)   Resp 20   Ht 5\' 7"  (1.702 m)   Wt 64.4 kg   LMP 02/18/2021   SpO2 96%   BMI 22.24 kg/m   PHYSICAL EXAM:  General: Alert, cooperative, no distress, appears stated age.  Head: Normocephalic, without obvious abnormality, atraumatic. Eyes: Conjunctivae clear, anicteric sclerae. Pupils are equal ENT Nares normal. No drainage or sinus tenderness. Lips, mucosa, and tongue normal. No Thrush Neck: Supple, symmetrical, no adenopathy, thyroid: non tender no carotid bruit and no JVD. Back: No CVA tenderness. Lungs: Clear to auscultation bilaterally. No Wheezing or Rhonchi. No rales. Heart: Regular rate and rhythm, no murmur, rub or gallop. Abdomen: Soft, non-tender,not distended. Bowel sounds normal. No masses Ileostomy( ileal conduit) Extremities: atraumatic, no cyanosis. No edema. No clubbing Skin: No rashes or lesions. Or bruising Lymph: Cervical, supraclavicular normal. Neurologic: moves lims- not examined in detaill Pertinent Labs Lab Results CBC    Component Value Date/Time   WBC 9.3 02/17/2023 0536   RBC 3.96 02/17/2023 0536   HGB 12.3 02/17/2023 0536   HCT 36.2 02/17/2023 0536   HCT 31.7 (L) 04/28/2012 0618   PLT 306 02/17/2023 0536   MCV 91.4 02/17/2023 0536   MCH 31.1 02/17/2023 0536   MCHC 34.0 02/17/2023 0536   RDW 12.7 02/17/2023 0536   LYMPHSABS 1.7 10/02/2022 1222   MONOABS 0.6 10/02/2022 1222   EOSABS 0.1 10/02/2022 1222   BASOSABS 0.1 10/02/2022 1222       Latest Ref Rng & Units 02/17/2023    5:36 AM 02/14/2023    6:24 AM 02/13/2023    3:44 PM  CMP  Glucose 70 - 99 mg/dL 97  92  95   BUN 6 - 20 mg/dL 9  8  9    Creatinine 0.44 - 1.00 mg/dL 5.40  9.81  1.91   Sodium 135 - 145 mmol/L 139  137  135   Potassium 3.5 - 5.1 mmol/L 3.4  3.7  3.7   Chloride 98 - 111 mmol/L 106  111   106   CO2 22 - 32 mmol/L 24  20  21    Calcium 8.9 - 10.3 mg/dL 8.8  7.8  9.3   Total Protein 6.5 - 8.1 g/dL   7.7   Total Bilirubin 0.3 - 1.2 mg/dL   1.3   Alkaline Phos 38 - 126 U/L   50   AST 15 - 41 U/L   20   ALT 0 - 44 U/L   23       Microbiology: Recent Results (from the past 240 hour(s))  Culture, blood (Routine x 2)     Status: None   Collection Time: 02/13/23  3:40 PM   Specimen: BLOOD  Result Value Ref Range Status   Specimen Description BLOOD BLOOD RIGHT FOREARM  Final   Special Requests   Final    BOTTLES DRAWN AEROBIC AND ANAEROBIC Blood Culture results may not be optimal due to an excessive volume of blood received in culture bottles  Culture   Final    NO GROWTH 5 DAYS Performed at [facility], [address] [place], [place] [address]    Report Status 02/18/2023 FINAL  Final  Urine Culture (for pregnant, neutropenic or urologic patients or patients with an indwelling urinary catheter)     Status: Abnormal   Collection Time: 02/13/23  5:37 PM   Specimen: Urine, Random  Result Value Ref Range Status   Specimen Description   Final    URINE, RANDOM Performed at [facility], [address], [place], [place] [address]    Special Requests   Final    NONE Performed at [facility], [address], [place], [place] [address]    Culture (A)  Final    >=100,000 COLONIES/mL ENTEROCOCCUS FAECALIS >=100,000 COLONIES/mL KLEBSIELLA PNEUMONIAE    Report Status 02/16/2023 FINAL  Final   Organism ID, Bacteria ENTEROCOCCUS FAECALIS (A)  Final   Organism ID, Bacteria KLEBSIELLA PNEUMONIAE (A)  Final      Susceptibility   Enterococcus faecalis - MIC*    AMPICILLIN <=2 SENSITIVE Sensitive     NITROFURANTOIN <=16 SENSITIVE Sensitive     VANCOMYCIN 1 SENSITIVE Sensitive     * >=100,000 COLONIES/mL ENTEROCOCCUS FAECALIS   Klebsiella pneumoniae - MIC*    AMPICILLIN >=32 RESISTANT Resistant     CEFAZOLIN <=4 SENSITIVE Sensitive     CEFEPIME  <=0.12 SENSITIVE Sensitive     CEFTRIAXONE <=0.25 SENSITIVE Sensitive     CIPROFLOXACIN <=0.25 SENSITIVE Sensitive     GENTAMICIN <=1 SENSITIVE Sensitive     IMIPENEM <=0.25 SENSITIVE Sensitive     NITROFURANTOIN 32 SENSITIVE Sensitive     TRIMETH/SULFA <=20 SENSITIVE Sensitive     AMPICILLIN/SULBACTAM 8 SENSITIVE Sensitive     PIP/TAZO 8 SENSITIVE Sensitive ug/mL    * >=100,000 COLONIES/mL KLEBSIELLA PNEUMONIAE  Culture, blood (Routine x 2)     Status: None (Preliminary result)   Collection Time: 02/13/23 10:58 PM   Specimen: BLOOD  Result Value Ref Range Status   Specimen Description BLOOD BLOOD LEFT HAND  Final   Special Requests   Final    BOTTLES DRAWN AEROBIC AND ANAEROBIC Blood Culture results may not be optimal due to an inadequate volume of blood received in culture bottles   Culture   Final    NO GROWTH 4 DAYS Performed at [facility], [address] [place], [place] [address]    Report Status PENDING  Incomplete    IMAGING RESULTS: Ct abdomen/pelvis reviewed Rt ovarian cyst I have personally reviewed the films ? Impression/Recommendation ?Syncope  Secondary  adrenal insufficiency Neurogenic bladder- s/p cystectomy, ileal conduit Last few times urine culture neg UA this time no wbc So the bacteria in the urine  likely colonization of the ileal conduit or the bag Has already received 7 days of antibiotic Can be discontinued  Discussed the management with patient , her mother and hospitalist ? ID will sign off Note:  This document was prepared using Dragon voice recognition software and may include unintentional dictation errors.

## 2023-02-19 NOTE — Progress Notes (Addendum)
Consult History and Physical   SERVICE: Gynecology   Patient Name: Christine Chavez Patient MRN:   [id number]  CC: abdominal pain  HPI: Christine Chavez is a 42 y.o. [phone] with right sided abdominal pain and ovarian cyst on transvaginal ultrasound. She is concerned that since CT showed the cyst abuts ileal conduit that her pain could be due to the size of the cyst. She is concerned that the cyst has grown since her last kidney ultrasound at the beginning of October. She also reports the transvaginal ultrasound was very painful. Denies change to vaginal discharge.  Past Obstetrical History: OB History     Gravida  3   Para  3   Term  3   Preterm      AB      Living  3      SAB      IAB      Ectopic      Multiple      Live Births  3           Past Gynecologic History: Patient's last menstrual period was 02/18/2021. Menses absent due to hysterectomy.  Past Medical History: Past Medical History:  Diagnosis Date   Bladder retention    Complication of anesthesia    ? seizures after anesthesia    Depression    Diarrhea due to malabsorption    Dizziness    Headache    Migraines    Neurogenic bladder    Pyelonephritis    Renal disorder    Vision abnormalities    Weight loss     Past Surgical History:   Past Surgical History:  Procedure Laterality Date   ANTERIOR CRUCIATE LIGAMENT REPAIR  1997   APPENDECTOMY     BLADDER REMOVAL     COLONOSCOPY WITH PROPOFOL N/A 11/11/2018   Procedure: COLONOSCOPY WITH PROPOFOL;  Surgeon: Christena Deem, MD;  Location: [facility] ENDOSCOPY;  Service: Endoscopy;  Laterality: N/A;   CYSTOSCOPY WITH STENT PLACEMENT Right 04/17/2016   Procedure: CYSTOSCOPY WITH STENT PLACEMENT;  Surgeon: Malen Gauze, MD;  Location: [facility] ORS;  Service: Urology;  Laterality: Right;   ESOPHAGOGASTRODUODENOSCOPY (EGD) WITH PROPOFOL N/A 11/11/2018   Procedure: ESOPHAGOGASTRODUODENOSCOPY (EGD) WITH PROPOFOL;  Surgeon: Christena Deem, MD;  Location: [facility] ENDOSCOPY;  Service: Endoscopy;  Laterality: N/A;   EXPLORATORY LAPAROTOMY  1999   HIP SURGERY Right    KIDNEY STONE SURGERY  04/2016   REVISION UROSTOMY CUTANEOUS     REVISION UROSTOMY CUTANEOUS  01/10/2018   SUPRAPUBIC CATHETER PLACEMENT  08/2017   TONSILLECTOMY      Family History:  family history includes Arthritis/Rheumatoid in her paternal grandmother; Atrial fibrillation in her father; Depression in her brother; Healthy in her brother and brother; Hypertension in her mother.  Social History:  Social History   Socioeconomic History   Marital status: Married    Spouse name: Not on file   Number of children: Not on file   Years of education: Not on file   Highest education level: Not on file  Occupational History   Not on file  Tobacco Use   Smoking status: Never   Smokeless tobacco: Never  Vaping Use   Vaping status: Never Used  Substance and Sexual Activity   Alcohol use: No    Alcohol/week: 0.0 standard drinks of alcohol   Drug use: No   Sexual activity: Yes    Birth control/protection: None    Comment: vasectomy  Other Topics Concern  Not on file  Social History Narrative   Not on file   Social Determinants of Health   Financial Resource Strain: Low Risk  (11/08/2022)   Received from [facility]   Overall Financial Resource Strain (CARDIA)    Difficulty of Paying Living Expenses: Not very hard  Food Insecurity: No Food Insecurity (02/14/2023)   Hunger Vital Sign    Worried About Running Out of Food in the Last Year: Never true    Ran Out of Food in the Last Year: Never true  Transportation Needs: No Transportation Needs (02/14/2023)   PRAPARE - Administrator, Civil Service (Medical): No    Lack of Transportation (Non-Medical): No  Physical Activity: Sufficiently Active (12/10/2020)   Received from [facility], [facility]   Exercise Vital Sign    Days of  Exercise per Week: 5 days    Minutes of Exercise per Session: 30 min  Stress: No Stress Concern Present (12/10/2020)   Received from [facility], [facility]   [employer] of Occupational Health - Occupational Stress Questionnaire    Feeling of Stress : Only a little  Social Connections: Moderately Integrated (12/10/2020)   Received from [facility], [facility]   Social Connection and Isolation Panel [NHANES]    Frequency of Communication with Friends and Family: More than three times a week    Frequency of Social Gatherings with Friends and Family: Once a week    Attends Religious Services: More than 4 times per year    Active Member of [employer] or Organizations: No    Attends Banker Meetings: Never    Marital Status: Married  Catering manager Violence: Not At Risk (02/14/2023)   Humiliation, Afraid, Rape, and Kick questionnaire    Fear of Current or Ex-Partner: No    Emotionally Abused: No    Physically Abused: No    Sexually Abused: No    Home Medications:  Medications reconciled in EPIC  No current facility-administered medications on file prior to encounter.   Current Outpatient Medications on File Prior to Encounter  Medication Sig Dispense Refill   baclofen (LIORESAL) 20 MG tablet Take 20 mg by mouth 2 (two) times daily.     belladonna-opium (B&O SUPPRETTES) 16.2-30 MG suppository Place 30 mg rectally every 8 (eight) hours as needed for pain.     cephALEXin (KEFLEX) 250 MG capsule Take 250 mg by mouth daily.     Cholecalciferol (D 1000) 25 MCG (1000 UT) capsule Take 1,000 Units by mouth daily.     clotrimazole (LOTRIMIN) 1 % cream Apply 1 Application topically 2 (two) times daily.     Cyanocobalamin (VITAMIN B-12) 2500 MCG SUBL Take 2,500 mcg by mouth daily.     DULoxetine (CYMBALTA) 60 MG capsule Take 60 mg by mouth in the morning.     EPINEPHrine 0.3 mg/0.3 mL IJ SOAJ injection Inject  into the muscle.     Fremanezumab-vfrm 225 MG/1.5ML SOAJ Inject 225 mg into the skin See admin instructions.     hydrocortisone (CORTEF) 10 MG tablet Take 10-20 mg by mouth 2 (two) times daily.     hydrocortisone (CORTEF) 5 MG tablet Take 10-30 mg by mouth 2 (two) times daily as needed (FOR SICK DAYS AS NEEDED).     HYDROmorphone (DILAUDID) 2 MG tablet Take 2 mg by mouth every 12 (twelve) hours as needed for moderate pain (pain score 4-6).  hydrOXYzine (ATARAX) 25 MG tablet Take 25 mg by mouth 3 (three) times daily as needed for itching.     linaclotide (LINZESS) 72 MCG capsule Take 72 mcg by mouth daily before breakfast.     mirtazapine (REMERON) 15 MG tablet Take 15 mg by mouth at bedtime.     naloxone (NARCAN) nasal spray 4 mg/0.1 mL Place 1 spray into the nose once as needed (Suspected Opiod Overdose).     nystatin (MYCOSTATIN) 100000 UNIT/ML suspension Use as directed 4 mLs in the mouth or throat 4 (four) times daily.     nystatin cream (MYCOSTATIN) Apply 1 Application topically 2 (two) times daily.     ondansetron (ZOFRAN) 4 MG tablet Take 4 mg by mouth every 8 (eight) hours as needed for nausea or vomiting.     pantoprazole (PROTONIX) 40 MG tablet Take 40 mg by mouth 2 (two) times daily.      pregabalin (LYRICA) 100 MG capsule Take 100 mg by mouth 2 (two) times daily. Takes 2 caps po q am and 2 caps po QHS     promethazine (PHENERGAN) 25 MG tablet Take 25 mg by mouth every 8 (eight) hours as needed for nausea or vomiting.     rizatriptan (MAXALT) 10 MG tablet Take 10 mg by mouth as needed for migraine.     scopolamine (TRANSDERM-SCOP) 1 MG/3DAYS Place 1 patch onto the skin every 3 (three) days.     SOLU-CORTEF 100 MG injection Inject 100 mg into the muscle once as needed (Adrenal Emergency).     tiZANidine (ZANAFLEX) 2 MG tablet Take 2 mg by mouth at bedtime.     triamcinolone cream (KENALOG) 0.1 % Apply 1 Application topically 2 (two) times daily.     UBRELVY 50 MG TABS Take by mouth.      B Complex Vitamins (VITAMIN B COMPLEX 100) INJ Inject as directed. (Patient not taking: Reported on 02/14/2023)     buPROPion (WELLBUTRIN XL) 150 MG 24 hr tablet Take 150 mg by mouth daily. (Patient not taking: Reported on 02/14/2023)     busPIRone (BUSPAR) 5 MG tablet Take 5 mg by mouth 3 (three) times daily. (Patient not taking: Reported on 02/14/2023)     cyanocobalamin 1000 MCG tablet Take by mouth. (Patient not taking: Reported on 02/14/2023)     diazepam (VALIUM) 2 MG tablet Take 2 mg by mouth every 6 (six) hours as needed for anxiety or muscle spasms (Vaginal application for PFM spasms). (Patient not taking: Reported on 02/14/2023)     metoCLOPramide (REGLAN) 10 MG tablet Take 10 mg by mouth 4 (four) times daily. (Patient not taking: Reported on 02/14/2023)     mirabegron ER (MYRBETRIQ) 50 MG TB24 tablet Take 50 mg by mouth daily.  (Patient not taking: Reported on 02/14/2023)     oxyCODONE (OXY IR/ROXICODONE) 5 MG immediate release tablet Take 5 mg by mouth every 6 (six) hours as needed. (Patient not taking: Reported on 02/14/2023)     saccharomyces boulardii (FLORASTOR) 250 MG capsule Take 250 mg by mouth 2 (two) times daily. (Patient not taking: Reported on 02/14/2023)     tamsulosin (FLOMAX) 0.4 MG CAPS capsule Take 0.4 mg by mouth daily. (Patient not taking: Reported on 02/14/2023)     traMADol (ULTRAM) 50 MG tablet Take 50 mg by mouth every 6 (six) hours as needed. (Patient not taking: Reported on 02/14/2023)      Allergies:  Allergies  Allergen Reactions   Aspirin Shortness Of Breath, Swelling and Anaphylaxis  Ibuprofen Shortness Of Breath, Swelling and Anaphylaxis   Ketorolac Anaphylaxis   Morphine Hives    Itching as well. This has happened twice. Last witness w/ itchy hived all over pt's neck, check, torso, and abdomen.    Dilaudid ok  Itching as well. This has happened twice. Last witness w/ itchy hived all over pt's neck, check, torso, and abdomen.  Dilaudid ok    Nsaids Anaphylaxis   Vibegron Anaphylaxis   Bupropion Palpitations   Morphine And Codeine Hives   Iron Dextran Hives    Developed LUE urticaria about 15 minutes into iron dextran infusion.   Methenamine Hippurate Rash    Physical Exam:  Temp:  [97.9 F (36.6 C)-98.4 F (36.9 C)] 98.4 F (36.9 C) (11/04 1719) Pulse Rate:  [64-75] 72 (11/04 1719) Resp:  [18-21] 20 (11/04 1719) BP: (99-120)/(65-78) 115/78 (11/04 1719) SpO2:  [95 %-97 %] 95 % (11/04 1719)   General Appearance:  Well developed, well nourished, no acute distress, alert and oriented, cooperative and appears stated age Abdomen:  soft, nontender to light palpation, nondistended, urostomy bag present. Pelvic:  NEFG, no vulvar masses or lesions, wet prep collected, speculum exam deferred, posterior introitus appears irritated    Labs/Studies:   CBC and Coags:  Lab Results  Component Value Date   WBC 9.3 02/17/2023   NEUTOPHILPCT 72 10/02/2022   EOSPCT 1 10/02/2022   BASOPCT 1 10/02/2022   LYMPHOPCT 19 10/02/2022   HGB 12.3 02/17/2023   HCT 36.2 02/17/2023   MCV 91.4 02/17/2023   PLT 306 02/17/2023   INR 1.0 02/13/2023   CMP:  Lab Results  Component Value Date   NA 139 02/17/2023   K 3.4 (L) 02/17/2023   CL 106 02/17/2023   CO2 24 02/17/2023   BUN 9 02/17/2023   CREATININE 0.71 02/17/2023   CREATININE 0.64 02/14/2023   CREATININE 0.76 02/13/2023   PROT 7.7 02/13/2023   BILITOT 1.3 (H) 02/13/2023   ALT 23 02/13/2023   AST 20 02/13/2023   ALKPHOS 50 02/13/2023    Other Imaging: US PELVIS TRANSVAGINAL NON-OB (TV ONLY)  Result Date: 02/16/2023 CLINICAL DATA:  Right adnexal cyst seen on recent CT. EXAM: TRANSABDOMINAL AND TRANSVAGINAL ULTRASOUND OF PELVIS DOPPLER ULTRASOUND OF OVARIES TECHNIQUE: Both transabdominal and transvaginal ultrasound examinations of the pelvis were performed. Transabdominal technique was performed for global imaging of the pelvis including uterus, ovaries, adnexal regions, and  pelvic cul-de-sac. It was necessary to proceed with endovaginal exam following the transabdominal exam to visualize the ovaries. Color and duplex Doppler ultrasound was utilized to evaluate blood flow to the ovaries. COMPARISON:  CT dated 02/13/2023 FINDINGS: Uterus Hysterectomy. Endometrium Hysterectomy. Right ovary Measurements: 4.0 x 3.8 x 4.5 cm = volume: 36 mL. There is a 3.7 x 4.3 x 4.0 cm complex cyst with lace-like architecture arising from the right ovary most consistent with a hemorrhagic cyst. No internal vascularity noted within this cyst. Left ovary Measurements: 2.5 x 1.8 x 2.0 cm = volume: 4.6 mL. Normal appearance/no adnexal mass. Pulsed Doppler evaluation of both ovaries demonstrates normal low-resistance arterial and venous waveforms. Other findings No abnormal free fluid. IMPRESSION: 1. Right ovarian complex/hemorrhagic cyst. Follow-up with ultrasound after 2 menstrual cycles recommended. 2. Unremarkable left ovary. 3. Hysterectomy. Electronically Signed   By: Elgie Collard M.D.   On: 02/16/2023 15:46   US PELVIC DOPPLER (TORSION R/O OR MASS ARTERIAL FLOW)  Result Date: 02/16/2023 CLINICAL DATA:  Right adnexal cyst seen on recent CT. EXAM: TRANSABDOMINAL AND TRANSVAGINAL  ULTRASOUND OF PELVIS DOPPLER ULTRASOUND OF OVARIES TECHNIQUE: Both transabdominal and transvaginal ultrasound examinations of the pelvis were performed. Transabdominal technique was performed for global imaging of the pelvis including uterus, ovaries, adnexal regions, and pelvic cul-de-sac. It was necessary to proceed with endovaginal exam following the transabdominal exam to visualize the ovaries. Color and duplex Doppler ultrasound was utilized to evaluate blood flow to the ovaries. COMPARISON:  CT dated 02/13/2023 FINDINGS: Uterus Hysterectomy. Endometrium Hysterectomy. Right ovary Measurements: 4.0 x 3.8 x 4.5 cm = volume: 36 mL. There is a 3.7 x 4.3 x 4.0 cm complex cyst with lace-like architecture arising from the  right ovary most consistent with a hemorrhagic cyst. No internal vascularity noted within this cyst. Left ovary Measurements: 2.5 x 1.8 x 2.0 cm = volume: 4.6 mL. Normal appearance/no adnexal mass. Pulsed Doppler evaluation of both ovaries demonstrates normal low-resistance arterial and venous waveforms. Other findings No abnormal free fluid. IMPRESSION: 1. Right ovarian complex/hemorrhagic cyst. Follow-up with ultrasound after 2 menstrual cycles recommended. 2. Unremarkable left ovary. 3. Hysterectomy. Electronically Signed   By: Elgie Collard M.D.   On: 02/16/2023 15:46   US PELVIS (TRANSABDOMINAL ONLY)  Result Date: 02/14/2023 CLINICAL DATA:  Adnexal cyst seen on CT abdomen and pelvis 02/13/2023 EXAM: TRANSABDOMINAL ULTRASOUND OF PELVIS TECHNIQUE: Transabdominal ultrasound examination of the pelvis was performed including evaluation of the uterus, ovaries, adnexal regions, and pelvic cul-de-sac. COMPARISON:  None Available. FINDINGS: Uterus/Endometrium Surgically absent. Right ovary Measurements: 6.0 x 3.2 x 4.3 cm = volume: 51 mL. Exam is limited by transabdominal technique and large amount of overlying bowel gas. Normal appearance/no adnexal mass. Left ovary Not visualized. Other findings:  No abnormal free fluid. IMPRESSION: 1. No definite right adnexal mass noting limitations of transabdominal technique and large amount of overlying bowel gas. 2. Left ovary not visualized. 3. Status post hysterectomy. Electronically Signed   By: Minerva Fester M.D.   On: 02/14/2023 21:56   CT ABDOMEN PELVIS W CONTRAST  Result Date: 02/13/2023 CLINICAL DATA:  Right flank pain, fever, history of pyelonephritis and bladder surgery EXAM: CT ABDOMEN AND PELVIS WITH CONTRAST TECHNIQUE: Multidetector CT imaging of the abdomen and pelvis was performed using the standard protocol following bolus administration of intravenous contrast. RADIATION DOSE REDUCTION: This exam was performed according to the departmental  dose-optimization program which includes automated exposure control, adjustment of the mA and/or kV according to patient size and/or use of iterative reconstruction technique. CONTRAST:  OMNIPAQUE IOHEXOL 300 MG/ML  SOLN COMPARISON:  CT 01/04/2017 FINDINGS: Lower chest: No acute abnormality. Hepatobiliary: Hepatic steatosis. Normal gallbladder. No biliary dilation. Pancreas: Unremarkable. Spleen: Unremarkable. Adrenals/Urinary Tract: Normal adrenal glands. 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 No urinary calculi or hydronephrosis. No evidence of pyelonephritis. Stomach/Bowel: Normal caliber large and small bowel. No bowel wall thickening. The appendix is not visualized.Stomach is within normal limits. Vascular/Lymphatic: No significant vascular findings are present. No enlarged abdominal or pelvic lymph nodes. Reproductive: Hysterectomy. Unremarkable left ovary. 5.2 cm cystic lesion in the expected location of the right adnexa. This abuts the ileal conduit. Other: No free intraperitoneal fluid or air. Musculoskeletal: No acute fracture. IMPRESSION: 1. 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 No urinary calculi or hydronephrosis. No evidence of pyelonephritis. 2. 5.2 cm cystic lesion in the expected location of the right adnexa. This abuts the ileal conduit. Differential considerations are ovarian cyst or patulous ileal conduit. Follow-up ultrasound in 3-6 months is recommended. 3. Hepatic steatosis. Electronically Signed  By: Minerva Fester M.D.   On: 02/13/2023 21:39   DG Chest Port 1 View  Result Date: 02/13/2023 CLINICAL DATA:  Sepsis EXAM: PORTABLE CHEST 1 VIEW COMPARISON:  None Available. FINDINGS: The heart size and mediastinal contours are within normal limits. Both lungs are clear. The visualized skeletal structures are unremarkable. IMPRESSION: No active disease. Electronically Signed   By: Sharlet Salina M.D.   On:  02/13/2023 21:17     Assessment / Plan:   Christine Chavez is a 42 y.o. [phone] who presents with right ovarian cyst, under treatment for complicated UTI. Reviewed with patient & family present that conservative management is recommended with pain control and reassessment on pelvic ultrasound in approximately 2 months to assess for spontaneous resolution. She is not a candidate for estrogen containing contraceptives due to history of migraine with aura. Recommend continued pain management and this has been ordered by her primary team. Wet prep collected given vaginal irritation and pain with ultrasound.  On review of ultrasound report from 01/18/23 performed at [facility] cyst noted was on LEFT ovary, this was reviewed with patient.  Addendum: wet prep negative for infection.  Thank you for the opportunity to be involved with this patient's care.  ----- Dominica Severin, CNM [facility]

## 2023-02-19 NOTE — TOC Progression Note (Signed)
Transition of Care [facility]) - Progression Note    Patient Details  Name: Christine Chavez MRN: [id number] Date of Birth: 03-01-1981  [facility] CM/SW Contact  Truddie Hidden, RN Phone Number: 02/19/2023, 3:56 PM  Clinical Narrative:    TOC continuing to follow patient's progress throughout discharge planning.        Expected Discharge Plan and Services                                               Social Determinants of Health (SDOH) Interventions SDOH Screenings   Food Insecurity: No Food Insecurity (02/14/2023)  Housing: Low Risk  (02/14/2023)  Transportation Needs: No Transportation Needs (02/14/2023)  Utilities: Not At Risk (02/14/2023)  Depression (PHQ2-9): Low Risk  (10/02/2022)  Financial Resource Strain: Low Risk  (11/08/2022)   Received from [facility]  Physical Activity: Sufficiently Active (12/10/2020)   Received from [facility], [facility]  Social Connections: Moderately Integrated (12/10/2020)   Received from [facility], [facility]  Stress: No Stress Concern Present (12/10/2020)   Received from [facility], [facility]  Tobacco Use: Low Risk  (02/14/2023)    Readmission Risk Interventions     No data to display

## 2023-02-19 NOTE — Progress Notes (Signed)
Progress Note   Patient: Christine Chavez ZOX:[id number] DOB: 19-Oct-1980 DOA: 02/13/2023     6 DOS: the patient was seen and examined on 02/19/2023   Brief hospital course: Ms. 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 She has nausea and right flank pain.  In ED, UA was positive for trace leukocytes and nitrates, she is started on IV pain medications, IV antiemetics, cefepime therapy with fluid bolus admitted to hospitalist service for further management evaluation  Assessment and Plan: * Enterococcus, Klebsiella UTI (urinary tract infection) Pyelonephritis ruled out on CT scan. Urology evaluation called, not impressed with urine. She had prior recurrent UTI and pyelonephritis in the setting of cystectomy and ileal conduit. Urine Culture/ sensitivities reviewed.  Continue unasyn therapy. ID consult pending for antibiotic recommendations.  Right flank pain Right adnexal cyst abutting ileal conduit. Urine output good. Seen by urology, no intervention recommended. Pelvic ultrasound did not reveal right adnexal cysts. Transvaginal uls reviewed shows right adnexal cyst. Possible source of pain. GYN follow up requested.  Continue IV antiemetics, pain control. Palliative on board for symptom management.  Syncope Vasovagal vs  dehydration Due to poor oral intake, nausea and vomiting. Gentle IV hydration. Encourage oral fluids. No more episodes.  Continue orthostatic vitals.  Secondary adrenal insufficiency (HCC) Per endocrine specialist note patient is on tapering dose of hydrocortisone. Discussed with pharmacy, given her sickness we will continue higher dose of hydrocortisone 20mg  a.m., 10mg  PM, her usual dose is 10am, 5 pm.  Neurogenic bladder Status post cystectomy and ileal conduit. Continue supportive care. Continue outpatient follow-up with nephrology, urology, endocrinology as appropriate  Hereditary spastic paraplegia (HCC) Continue Baclofen 20 mg 3 times daily, Pregabalin 100 mg twice daily.  Major depressive disorder, recurrent episode, moderate (HCC) Continue Home bupropion 150 mg daily.  Migraine headaches: Sumatriptan home dose daily PRN.   Continue constipation regimen. Out of bed to chair. Incentive spirometry. Nursing supportive care. Fall, aspiration precautions. DVT prophylaxis   Code Status: Full Code  Subjective: Patient is seen and examined today morning. Nausea and pain better, but she is still requiring lot of IV meds. She asked for palliative consult. Eating poor. Family at bedside.  Physical Exam: Vitals:   02/18/23 2337 02/19/23 0334 02/19/23 0807 02/19/23 1237  BP:  105/78 111/73 110/77  Pulse:  64 66 70  Resp: 18 18 20 20   Temp:  97.9 F (36.6 C) 97.9 F (36.6 C) 98 F (36.7 C)  TempSrc:  Oral Oral Oral  SpO2:  96% 96% 96%  Weight:      Height:        General - Young  Caucasian female, mild distress due to nausea, pain. HEENT - PERRLA, EOMI, atraumatic head, non tender sinuses. Lung - Clear, basal rales, no rhonchi, wheezes. Heart - S1, S2 heard, no murmurs, rubs, trace pedal edema. Abdomen - Soft, ileal conduit, right flank, lower abdomen tender Neuro - Alert, awake and oriented x 3, paraplegia. Skin - Warm  and dry.  Data Reviewed:      Latest Ref  Rng & Units 02/17/2023    5:36 AM 02/14/2023    6:24 AM 02/13/2023    3:44 PM  CBC  WBC 4.0 - 10.5 K/uL 9.3  9.7  10.9   Hemoglobin 12.0 - 15.0 g/dL 16.1  09.6  04.5   Hematocrit 36.0 - 46.0 % 36.2  33.7  39.2   Platelets 150 - 400 K/uL 306  277  348       Latest Ref Rng & Units 02/17/2023    5:36 AM 02/14/2023    6:24 AM 02/13/2023    3:44 PM  BMP  Glucose 70 - 99 mg/dL 97  92  95   BUN 6 - 20 mg/dL 9  8  9    Creatinine 0.44 - 1.00 mg/dL 4.09  8.11  9.14   Sodium 135 - 145 mmol/L 139  137  135   Potassium 3.5 - 5.1 mmol/L 3.4  3.7  3.7   Chloride 98 - 111 mmol/L 106  111  106   CO2 22 - 32 mmol/L 24  20  21    Calcium 8.9 - 10.3 mg/dL 8.8  7.8  9.3    No results found.   Family Communication: Discussed with patient, family at bedside they understand and agree. All questions answereed.  Disposition: Status is: Inpatient Remains inpatient appropriate because: IV antibiotics for UTI, Adnexal mass evaluation.  Planned Discharge Destination: Home with Home Health     Time spent: 36 minutes  Author: Marcelino Duster, MD 02/19/2023 3:51 PM Secure chat 7am to 7pm For on call review [email or web address].

## 2023-02-19 NOTE — Plan of Care (Signed)

## 2023-02-20 DIAGNOSIS — K760 Fatty (change of) liver, not elsewhere classified: Secondary | ICD-10-CM | POA: Diagnosis not present

## 2023-02-20 DIAGNOSIS — D509 Iron deficiency anemia, unspecified: Secondary | ICD-10-CM | POA: Diagnosis not present

## 2023-02-20 DIAGNOSIS — G114 Hereditary spastic paraplegia: Secondary | ICD-10-CM | POA: Diagnosis not present

## 2023-02-20 DIAGNOSIS — N390 Urinary tract infection, site not specified: Secondary | ICD-10-CM | POA: Diagnosis not present

## 2023-02-20 LAB — CULTURE, BLOOD (ROUTINE X 2): Culture: NO GROWTH

## 2023-02-20 MED ORDER — HYDROMORPHONE HCL 2 MG PO TABS
1.0000 mg | ORAL_TABLET | Freq: Once | ORAL | Status: AC
  Administered 2023-02-21: 1 mg via ORAL
  Filled 2023-02-20: qty 1

## 2023-02-20 MED ORDER — HYDROMORPHONE HCL 2 MG PO TABS
1.0000 mg | ORAL_TABLET | ORAL | Status: DC | PRN
  Administered 2023-02-20 – 2023-02-21 (×4): 1 mg via ORAL
  Filled 2023-02-20 (×4): qty 1

## 2023-02-20 MED ORDER — ONDANSETRON 4 MG PO TBDP
4.0000 mg | ORAL_TABLET | Freq: Four times a day (QID) | ORAL | Status: DC | PRN
  Administered 2023-02-20 – 2023-02-22 (×6): 4 mg via ORAL
  Filled 2023-02-20 (×10): qty 1

## 2023-02-20 MED ORDER — DIAZEPAM 2 MG PO TABS
2.0000 mg | ORAL_TABLET | Freq: Four times a day (QID) | ORAL | Status: DC | PRN
  Administered 2023-02-20: 2 mg via ORAL
  Filled 2023-02-20: qty 1

## 2023-02-20 MED ORDER — BACLOFEN 10 MG PO TABS
20.0000 mg | ORAL_TABLET | Freq: Once | ORAL | Status: AC
  Administered 2023-02-21: 20 mg via ORAL
  Filled 2023-02-20: qty 2

## 2023-02-20 NOTE — Plan of Care (Signed)

## 2023-02-20 NOTE — Plan of Care (Signed)

## 2023-02-20 NOTE — Progress Notes (Signed)
Progress Note   Patient: Christine Chavez [id number]:[id number] DOB: March 20, 1981 DOA: 02/13/2023     7 DOS: the patient was seen and examined on 02/20/2023   Brief hospital course: Ms. 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 She has nausea and right flank pain.  In ED, UA was positive for trace leukocytes and nitrates, she is started on IV pain medications, IV antiemetics, cefepime therapy with fluid bolus admitted to hospitalist service for further management evaluation. Urology team did not recommend any intervention advised gyn eval for right adnexal csyt. Gyn advised pelvic, TV uls, showed right adnexal cyst. Gyn advised 2 month follow up to monitor cyst growth, no intervention. Urine cultures grew Klebsiella, enterococcus, started Unasyn over weekend. ID recommended to stop IV abx today. She is still nauseous unable to tolerate diet.  Assessment and Plan: * Enterococcus, Klebsiella UTI (urinary tract infection) Pyelonephritis ruled out on CT scan. Urology evaluation called, not impressed with urine. She had prior recurrent UTI and pyelonephritis in the setting of cystectomy and ileal conduit. Initially received Cefepime for 3 days. Urine Culture/ sensitivities reviewed. Got 5 days of unasyn therapy. ID consulted advised to stop  antibiotics.  Right flank pain Right adnexal cyst abutting ileal conduit. Urine output good. Seen by urology, no intervention recommended. Pelvic ultrasound did not reveal right adnexal cysts. Transvaginal uls reviewed shows right adnexal cyst. Possible source of pain. GYN follow up requested.  Continue antiemetics, pain control with ODT zofran, oral dilaudid. Palliative on board for symptom management advised outpatient palliative follow up.  Syncope Vasovagal vs dehydration Due to poor oral intake, nausea and vomiting. Encourage oral fluids. No more episodes of syncope.  Secondary adrenal insufficiency (HCC) Per endocrine specialist note patient is on tapering dose of hydrocortisone. Given her sickness. continue higher dose of hydrocortisone 20mg  a.m., 10mg  PM, her usual dose is 10am, 5 pm. Her symptoms could be due to adrenal insufficiency.  Neurogenic bladder Status post cystectomy and ileal conduit. Continue supportive care. Continue outpatient follow-up with nephrology, urology, endocrinology as appropriate. She has her urologist at [facility], and other care with [facility].  Hereditary spastic paraplegia (HCC) Continue Baclofen 20 mg 3 times daily, Pregabalin 100 mg twice daily. Added Valium for muscle spasms.  Major depressive disorder, recurrent episode, moderate (HCC) Continue Home bupropion 150 mg daily.  Migraine headaches: Sumatriptan home dose daily PRN.   Continue constipation regimen. Out of bed to chair. Incentive spirometry. Nursing supportive care. Fall, aspiration precautions. DVT prophylaxis   Code Status: Full Code  Subjective: Patient is seen and examined today morning. Still feel nauseous. Eating poor. Family at bedside. Advised ODT Zofran and oral pain meds as she is nearing discharge.  Physical Exam: Vitals:   02/19/23 2322 02/20/23 0353 02/20/23 0850 02/20/23 1208  BP: 114/78 106/73 110/74 105/69  Pulse: 67 68 68 71  Resp: 18 18 16 16   Temp: 97.8 F  (36.6 C) 97.9 F (36.6 C) 97.6 F (36.4 C) 98.1 F (  36.7 C)  TempSrc: Oral     SpO2: 95% 96% 94% 96%  Weight:      Height:        General - Young  Caucasian female, mild distress due to nausea. HEENT - PERRLA, EOMI, atraumatic head, non tender sinuses. Lung - Clear, basal rales, no rhonchi, wheezes. Heart - S1, S2 heard, no murmurs, rubs, trace pedal edema. Abdomen - Soft, ileal conduit, bowel sounds good. Neuro - Alert, awake and oriented x 3, paraplegia. Skin - Warm and dry.  Data Reviewed:      Latest Ref Rng & Units 02/17/2023    5:36 AM 02/14/2023    6:24 AM 02/13/2023    3:44 PM  CBC  WBC 4.0 - 10.5 K/uL 9.3  9.7  10.9   Hemoglobin 12.0 - 15.0 g/dL 16.1  09.6  04.5   Hematocrit 36.0 - 46.0 % 36.2  33.7  39.2   Platelets 150 - 400 K/uL 306  277  348       Latest Ref Rng & Units 02/17/2023    5:36 AM 02/14/2023    6:24 AM 02/13/2023    3:44 PM  BMP  Glucose 70 - 99 mg/dL 97  92  95   BUN 6 - 20 mg/dL 9  8  9    Creatinine 0.44 - 1.00 mg/dL 4.09  8.11  9.14   Sodium 135 - 145 mmol/L 139  137  135   Potassium 3.5 - 5.1 mmol/L 3.4  3.7  3.7   Chloride 98 - 111 mmol/L 106  111  106   CO2 22 - 32 mmol/L 24  20  21    Calcium 8.9 - 10.3 mg/dL 8.8  7.8  9.3    No results found.   Family Communication: Discussed with patient, family at bedside they understand and agree. All questions answereed.  Disposition: Status is: Inpatient Remains inpatient appropriate because: nausea, abdominal pain, poor oral intake.  Planned Discharge Destination: Home with Home Health once able to tolerate diet.     Time spent: 39 minutes  Author: Marcelino Duster, MD 02/20/2023 4:03 PM Secure chat 7am to 7pm For on call review [email or web address].

## 2023-02-21 ENCOUNTER — Ambulatory Visit: Payer: BC Managed Care – PPO

## 2023-02-21 DIAGNOSIS — Z993 Dependence on wheelchair: Secondary | ICD-10-CM | POA: Diagnosis not present

## 2023-02-21 DIAGNOSIS — N390 Urinary tract infection, site not specified: Secondary | ICD-10-CM | POA: Diagnosis not present

## 2023-02-21 DIAGNOSIS — Z515 Encounter for palliative care: Secondary | ICD-10-CM | POA: Diagnosis not present

## 2023-02-21 DIAGNOSIS — R55 Syncope and collapse: Secondary | ICD-10-CM | POA: Diagnosis not present

## 2023-02-21 DIAGNOSIS — G114 Hereditary spastic paraplegia: Secondary | ICD-10-CM | POA: Diagnosis not present

## 2023-02-21 LAB — BASIC METABOLIC PANEL
Anion gap: 9 (ref 5–15)
BUN: 12 mg/dL (ref 6–20)
CO2: 23 mmol/L (ref 22–32)
Calcium: 8.7 mg/dL — ABNORMAL LOW (ref 8.9–10.3)
Chloride: 105 mmol/L (ref 98–111)
Creatinine, Ser: 0.84 mg/dL (ref 0.44–1.00)
GFR, Estimated: >60 mL/min (ref >60)
Glucose, Bld: 89 mg/dL (ref 70–99)
Potassium: 3.4 mmol/L — ABNORMAL LOW (ref 3.5–5.1)
Sodium: 137 mmol/L (ref 135–145)

## 2023-02-21 MED ORDER — HYDROMORPHONE HCL 2 MG PO TABS
2.0000 mg | ORAL_TABLET | Freq: Four times a day (QID) | ORAL | Status: DC | PRN
  Administered 2023-02-21 – 2023-02-22 (×4): 2 mg via ORAL
  Filled 2023-02-21 (×4): qty 1

## 2023-02-21 NOTE — Progress Notes (Signed)
Palliative Care Progress Note, Assessment & Plan   Patient Name: Christine Chavez       Date: 02/21/2023 DOB: Nov 23, 1980  Age: 42 y.o. MRN#: [id number] Attending Physician: Christine Bock, MD Primary Care Physician: Christine Man, PA-C Admit Date: 02/13/2023  Subjective: Is lying in bed in no apparent distress.  She acknowledges my presence and is able to make her wishes known.  A family friend and her nurse are at bedside during my visit.  HPI: 42 y.o. 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   Patient is being treated for dehydration, right flank pain, and Enterococcus Klebsiella UTI with IV pain medications and antiemetics, antibiotics, and IVF.  Ultrasound revealed right adnexal cysts abutting of the ileal conduit.  ID and gynecology consults are following.   PMT was consulted on day 5 of hospitalization for "pain control".  Summary of counseling/coordination of care: Extensive chart review completed prior to meeting patient including labs, vital signs, imaging, progress notes, orders, and available advanced directive documents from current and previous  encounters.   After reviewing the patient's chart and assessing the patient at bedside, I spoke with patient in regards to symptom management and goals of care.  Symptoms assessed.  Pain assessed.  Patient endorses that her pain medication is working but she does not think it is effective as it could be.  She is accustomed to taking 2 mg of p.o. Dilaudid at home to manage her pain.  She is receiving 1 mg every 4 hours as needed at this time.  We discussed that her pain is still greatly present after 1 mg administration.  Additionally, the pain is contributing to her nausea.  Discussed use of 2 mg every 6 hours as needed with attending who was in agreement.  Adjustments made to [facility] and RN notified.  Therapeutic silence, active listening, and emotional support provided for patient to share her thoughts and emotions regarding her current medical situation.  She and a friend (on FaceTime) discussed concerns of adrenal insufficiency.  Reviewed that should patient experience a passing out/unconscious episode, the use of steroid injections could be considered but should not be first-line reaction.  911 and emergency services should evaluate patient prior to administering any medications.  Patient and family were in agreement.  They share they would like to have a quick info sheet with patient's medical information in case an emergency situation happens again.  I encouraged them to create such a document and have it in case of emergency.  She shares appreciation that palliative team provides a layer of support and security for her.  Discussed role of outpatient palliative services at discharge.  Patient eager to have outpatient palliative  services follow-up.  Referral placed for TOC to offer choice.  Again highlighted that palliative medicine is like a guide on the side and extra layer support as she walks with his chronic disease process.  Questions and concerns were addressed.  PMT remains available to patient  throughout her hospitalization.  Full code and full scope remain.  Physical Exam Vitals reviewed.  Constitutional:      General: She is not in acute distress.    Appearance: She is normal weight.  HENT:     Head: Normocephalic.     Mouth/Throat:     Mouth: Mucous membranes are moist.  Eyes:     Pupils: Pupils are equal, round, and reactive to light.  Pulmonary:     Effort: Pulmonary effort is normal.  Abdominal:     Palpations: Abdomen is soft.  Skin:    General: Skin is warm and dry.  Neurological:     Mental Status: She is alert and oriented to person, place, and time.  Psychiatric:        Mood and Affect: Mood normal.        Behavior: Behavior normal.        Thought Content: Thought content normal.        Judgment: Judgment normal.             Total Time 35 minutes   Time spent includes: Detailed review of medical records (labs, imaging, vital signs), medically appropriate exam (mental status, respiratory, cardiac, skin), discussed with treatment team, counseling and educating patient, family and staff, documenting clinical information, medication management and coordination of care.  Christine Chavez L. Christine Quin, DNP, FNP-BC Palliative Medicine Team

## 2023-02-21 NOTE — Progress Notes (Signed)
Progress Note Patient: Christine Chavez GNF:[id number] DOB: [birth year] DOA: 02/13/2023     8 DOS: the patient was seen and examined on 02/21/2023   Brief hospital course: Christine Chavez is a 42 year old female with history of hereditary spastic paraplegia, neurogenic bladder status post bladder cystectomy and ileal conduit (per OSH in February 2024) complicated by recurrent UTI and pyelonephritis, dysautonomia, history of secondary adrenal insufficiency, presence of urostomy, history of cutaneous appendicovesicostomy enterectomy, resection of small intestine, urinary retention in 2019, status post right hip arthroplasty in 2023, hyperhidrosis, anxiety, history of B12 deficiency, history of 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  They presented to the emergency department for chief concerns of syncope. Also has nausea and right flank pain.  In ED, UA was positive for trace leukocytes and nitrates, she is started on IV pain medications, IV antiemetics, cefepime therapy with fluid bolus admitted to hospitalist service for further management evaluation. Urology team did not recommend any intervention, advised gyn eval for right adnexal csyt. Gyn advised pelvis US which showed right adnexal cyst and recommended 2 month follow up to monitor cyst growth, no intervention.   Urine cultures grew Klebsiella, enterococcus, started Unasyn over weekend per ID guidance was completed yesterday.   Her medical workup is completed and she is stable for discharge when she can tolerate PO diet. Continuing her chronic home meds and PRN antiemetics.  Assessment and Plan: Enterococcus, Klebsiella UTI  Pyelonephritis ruled out on CT scan. Urology evaluated. H/o cystectomy and ileal conduit. Initially received Cefepime for 3 days. Got 5 days of unasyn therapy. ID consulted advised to stop antibiotics.  Right flank pain Right adnexal cyst abutting ileal  conduit. Urine output good. Seen by urology, no intervention recommended. Pelvic ultrasound did not reveal right adnexal cysts. Transvaginal US  shows right adnexal cyst. Possible source of pain. GYN follow up outpatient.  Continue antiemetics, pain control with ODT zofran, oral dilaudid. Palliative on board for symptom management.  Syncope Vasovagal vs dehydration Due to poor oral intake, nausea and vomiting. Encourage oral fluids. No more episodes of syncope.  Secondary adrenal insufficiency (HCC) Per endocrine specialist note patient is on tapering dose of hydrocortisone. Given her sickness. continue higher dose of hydrocortisone 20mg  a.m., 10mg  PM, her usual dose is 10am, 5 pm. Her symptoms could be due to adrenal insufficiency.  Neurogenic bladder Status post cystectomy and ileal conduit. Continue supportive care. Continue outpatient follow-up with nephrology, urology, endocrinology as appropriate. She has her urologist at [facility], and other care with [facility].  Hereditary spastic paraplegia (HCC) Continue Baclofen 20 mg 3 times daily, Pregabalin 100 mg twice daily. Added Valium for muscle spasms.  Major depressive disorder, recurrent episode, moderate (HCC) Continue Home bupropion 150 mg daily.  Migraine headaches: Sumatriptan home dose daily PRN.   Continue constipation regimen. Out of bed to chair. Incentive spirometry. Nursing supportive care. Fall, aspiration precautions. DVT prophylaxis   Code Status: Full Code  Subjective: Patient had emesis episode overnight. Able to tolerate some PO today. Will inform if she feels ready to go home.   Physical Exam: Vitals:   02/20/23 2303 02/20/23 2323 02/21/23 0028 02/21/23 0439  BP: (!) 85/71 95/63 100/68 97/72  Pulse: 72 70 68 62  Resp: 18   18  Temp: 97.6 F (36.4 C)   98 F (36.7 C)  TempSrc: Oral   Oral  SpO2: 97%   96%  Weight:      Height:  General -  NAD HEENT - PERRLA, EOMI, atraumatic head, non tender  sinuses. Lung - Clear, basal rales, no rhonchi, wheezes. Heart - S1, S2 heard, no murmurs, rubs, trace pedal edema. Abdomen - Soft, ileal conduit, bowel sounds good. Neuro - Alert, awake and oriented x 3, paraplegia. Skin - Warm and dry.  Data Reviewed:     Latest Ref Rng & Units 02/17/2023    5:36 AM 02/14/2023    6:24 AM 02/13/2023    3:44 PM  CBC  WBC 4.0 - 10.5 K/uL 9.3  9.7  10.9   Hemoglobin 12.0 - 15.0 g/dL 16.1  09.6  04.5   Hematocrit 36.0 - 46.0 % 36.2  33.7  39.2   Platelets 150 - 400 K/uL 306  277  348       Latest Ref Rng & Units 02/17/2023    5:36 AM 02/14/2023    6:24 AM 02/13/2023    3:44 PM  BMP  Glucose 70 - 99 mg/dL 97  92  95   BUN 6 - 20 mg/dL 9  8  9    Creatinine 0.44 - 1.00 mg/dL 4.09  8.11  9.14   Sodium 135 - 145 mmol/L 139  137  135   Potassium 3.5 - 5.1 mmol/L 3.4  3.7  3.7   Chloride 98 - 111 mmol/L 106  111  106   CO2 22 - 32 mmol/L 24  20  21    Calcium 8.9 - 10.3 mg/dL 8.8  7.8  9.3    No results found.   Family Communication: Discussed with patient, family friend at bedside  Disposition: Status is: Inpatient Remains inpatient appropriate because: nausea, abdominal pain, poor oral intake.  Planned Discharge Destination: Home with Home Health once able to tolerate diet.     Time spent: 45 minutes  Author: Leeroy Bock, MD 02/21/2023 8:39 AM Secure chat 7am to 7pm For on call review [email or web address]

## 2023-02-21 NOTE — Plan of Care (Signed)

## 2023-02-22 DIAGNOSIS — R55 Syncope and collapse: Secondary | ICD-10-CM | POA: Diagnosis not present

## 2023-02-22 DIAGNOSIS — R112 Nausea with vomiting, unspecified: Secondary | ICD-10-CM | POA: Diagnosis not present

## 2023-02-22 DIAGNOSIS — N390 Urinary tract infection, site not specified: Secondary | ICD-10-CM | POA: Diagnosis not present

## 2023-02-22 MED ORDER — ACETAMINOPHEN 325 MG PO TABS: 650.0000 mg | ORAL_TABLET | Freq: Four times a day (QID) | ORAL | 0 refills | Status: AC | PRN

## 2023-02-22 MED ORDER — ONDANSETRON 4 MG PO TBDP: 4.0000 mg | ORAL_TABLET | Freq: Four times a day (QID) | ORAL | 0 refills | Status: DC | PRN

## 2023-02-22 MED ORDER — PROMETHAZINE HCL 12.5 MG RE SUPP: 12.5000 mg | Freq: Four times a day (QID) | RECTAL | 0 refills | Status: AC | PRN

## 2023-02-22 MED ORDER — POLYETHYLENE GLYCOL 3350 17 G PO PACK: 17.0000 g | PACK | Freq: Every day | ORAL | 0 refills | Status: AC

## 2023-02-22 MED ORDER — HYDROCORTISONE 10 MG PO TABS: ORAL_TABLET | ORAL | Status: DC

## 2023-02-22 MED ORDER — PANTOPRAZOLE SODIUM 20 MG PO TBEC: 20.0000 mg | DELAYED_RELEASE_TABLET | Freq: Every day | ORAL | 0 refills | Status: DC

## 2023-02-22 MED ORDER — HYDROMORPHONE HCL 2 MG PO TABS: 2.0000 mg | ORAL_TABLET | Freq: Four times a day (QID) | ORAL | 0 refills | Status: AC | PRN

## 2023-02-22 NOTE — Progress Notes (Signed)
Palliative Care Progress Note, Assessment & Plan   Patient Name: Christine Chavez       Date: 02/22/2023 DOB: [birth year]  Age: 42 y.o. MRN#: [id number] Attending Physician: Leeroy Bock, MD Primary Care Physician: Ardyth Man, PA-C Admit Date: 02/13/2023  Subjective: Patient is sitting up in bed in no apparent distress.  She is awake, alert, and oriented x 4.  She acknowledges my presence and is able to make her wishes known.  Her mother and in-laws are at bedside during my visit.  HPI: 42 y.o. 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   Patient is being treated for dehydration, right flank pain, and Enterococcus Klebsiella UTI with IV pain medications and antiemetics, antibiotics, and IVF.  Ultrasound revealed right adnexal cysts abutting of the ileal conduit.  ID and gynecology consults are following.   PMT was consulted on day 5 of hospitalization for "pain control".  Summary of counseling/coordination of care: Extensive chart review completed prior to meeting patient including labs, vital signs, imaging, progress notes, orders, and available advanced  directive documents from current and previous encounters.   After reviewing the patient's chart and assessing the patient at bedside, I discussed symptom management and plan of care with patient.  Symptoms assessed.  Patient endorses she has not felt nauseous or as severe pain overnight.  She endorses that the 2 mg of Dilaudid every 6 hours are managing her pain and minimizing it well.  No adjustment to [facility] needed at this time.  We discussed next steps for patient.  She plans to discharge home today.  She is concerned that her medications will not be the same.  She is requesting Phenergan suppository.  Discussed use of Zofran as first-line therapy for nausea.  Phenergan should be used for breakthrough/refractory nausea.  Patient endorsed understanding.  I would recommend Phenergan suppository for discharge medications.  Discussed role of outpatient palliative services.  Referral remains in place for patient to have outpatient palliative services to follow at discharge.  Patient has discussed with her friends and has preference for [facility] outpatient palliative services.  I updated referral with TOC to reflect patient's preference for [facility].  Full code and full scope remain.  Goals are clear.  Plan is set to discharge today.  Symptom burden is low.  No adjustment to [facility] needed.  Outpatient palliative services to follow.  Patient has PMT contact info and was encouraged to call with any future acute palliative needs.  PMT will step back and out of the patient's chart.  Please reengage with PMT if goals change, at patient/family's request, or if patient's health deteriorates during hospitalization.  Physical Exam Vitals reviewed.  Constitutional:  General: She is not in acute distress.    Appearance: She is normal weight.  HENT:     Head: Normocephalic.     Mouth/Throat:     Mouth: Mucous membranes are moist.  Eyes:     Pupils: Pupils are equal, round, and reactive to light.   Cardiovascular:     Rate and Rhythm: Normal rate.     Pulses: Normal pulses.  Pulmonary:     Effort: Pulmonary effort is normal.  Abdominal:     Palpations: Abdomen is soft.  Skin:    General: Skin is warm and dry.  Neurological:     Mental Status: She is alert and oriented to person, place, and time.  Psychiatric:        Mood and Affect: Mood normal.        Behavior: Behavior normal.        Thought Content: Thought content normal.        Judgment: Judgment normal.             Total Time 35 minutes   Time spent includes: Detailed review of medical records (labs, imaging, vital signs), medically appropriate exam (mental status, respiratory, cardiac, skin), discussed with treatment team, counseling and educating patient, family and staff, documenting clinical information, medication management and coordination of care.  Samara Deist L. Bonita Quin, DNP, FNP-BC Palliative Medicine Team

## 2023-02-22 NOTE — Discharge Summary (Signed)
Physician Discharge Summary  Patient: Christine Chavez WUJ:[id number] DOB: 1981-01-13   Code Status: Full Code Admit date: 02/13/2023 Discharge date: 02/22/2023 Disposition: Home health, palliative care PCP: Ardyth Man, PA-C  Recommendations for Outpatient Follow-up:  Follow up with PCP within 1-2 weeks Regarding general hospital follow up and preventative care Follow up with palliative Follow up with gynecology- 2 month follow up pelvic US to assess cyst  Discharge Diagnoses:  Principal Problem:   UTI (urinary tract infection) Active Problems:   Major depressive disorder, recurrent episode, moderate (HCC)   Iron deficiency anemia   Hereditary spastic paraplegia (HCC)   Neurogenic bladder   Hepatic steatosis   Wheelchair dependent   Secondary adrenal insufficiency (HCC)   Right flank pain   Syncope and collapse   Palliative care by specialist   Refractory nausea and vomiting  Brief Hospital Course Summary: Satomi Chavez is a 42 year old female with history of hereditary spastic paraplegia, neurogenic bladder status post bladder cystectomy and ileal conduit (per OSH in February 2024) complicated by recurrent UTI and pyelonephritis, dysautonomia, history of secondary adrenal insufficiency, presence of urostomy, history of cutaneous appendicovesicostomy enterectomy, resection of small intestine, urinary retention in 2019, status post right hip arthroplasty in 2023, hyperhidrosis, anxiety, history of B12 deficiency, history of 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   They presented to the emergency department for chief concerns of syncope. Also has nausea and right flank pain.   In ED, UA was positive for trace leukocytes and nitrates, she is started on IV pain medications, IV antiemetics, cefepime therapy with fluid bolus admitted to hospitalist service for further management evaluation. Urology team did not  recommend any intervention, advised gyn eval for right adnexal csyt. Gyn advised pelvis US which showed right adnexal cyst and recommended 2 month follow up to monitor cyst growth, no intervention.    Urine cultures grew Klebsiella, enterococcus, started Unasyn over weekend completed course per ID guidance.    Her medical workup was completed discharge was delayed until her nausea resolved so that she could tolerate a PO diet.  Continuing her chronic home meds and PRN antiemetics.  Discharge Condition: Fair, improved Recommended discharge diet: Regular healthy diet  Consultations: Palliative Urology Gynecology  ID  Procedures/Studies: Transvaginal US  Allergies as of 02/22/2023       Reactions   Aspirin Shortness Of Breath, Swelling, Anaphylaxis   Ibuprofen Shortness Of Breath, Swelling, Anaphylaxis   Ketorolac Anaphylaxis   Morphine Hives   Itching as well. This has happened twice. Last witness w/ itchy hived all over pt's neck, check, torso, and abdomen.    Dilaudid ok Itching as well. This has happened twice. Last witness w/ itchy hived all over pt's neck, check, torso, and abdomen.  Dilaudid ok   Nsaids Anaphylaxis   Vibegron Anaphylaxis   Bupropion Palpitations   Morphine And Codeine Hives   Iron Dextran Hives   Developed LUE urticaria about 15 minutes into iron dextran infusion.   Methenamine Hippurate Rash        Medication List     STOP taking these medications    buPROPion 150 MG 24 hr tablet Commonly known as: WELLBUTRIN XL   busPIRone 5 MG tablet Commonly known as: BUSPAR   cephALEXin 250 MG capsule Commonly known as: KEFLEX   metoCLOPramide 10 MG tablet Commonly known as: REGLAN   mirabegron ER 50 MG Tb24 tablet Commonly known as: MYRBETRIQ   oxyCODONE 5 MG immediate  release tablet Commonly known as: Oxy IR/ROXICODONE   saccharomyces boulardii 250 MG capsule Commonly known as: FLORASTOR   tamsulosin 0.4 MG Caps capsule Commonly known as:  FLOMAX   traMADol 50 MG tablet Commonly known as: ULTRAM   Vitamin B Complex 100 Inj       TAKE these medications    acetaminophen 325 MG tablet Commonly known as: TYLENOL Take 2 tablets (650 mg total) by mouth every 6 (six) hours as needed for fever, mild pain (pain score 1-3) or moderate pain (pain score 4-6).   baclofen 20 MG tablet Commonly known as: LIORESAL Take 20 mg by mouth 2 (two) times daily.   belladonna-opium 16.2-30 MG suppository Commonly known as: B&O SUPPRETTES Place 30 mg rectally every 8 (eight) hours as needed for pain.   clotrimazole 1 % cream Commonly known as: LOTRIMIN Apply 1 Application topically 2 (two) times daily.   D 1000 25 MCG (1000 UT) capsule Generic drug: Cholecalciferol Take 1,000 Units by mouth daily.   diazepam 2 MG tablet Commonly known as: VALIUM Take 2 mg by mouth every 6 (six) hours as needed for anxiety or muscle spasms (Vaginal application for PFM spasms).   DULoxetine 60 MG capsule Commonly known as: CYMBALTA Take 60 mg by mouth in the morning.   EPINEPHrine 0.3 mg/0.3 mL Soaj injection Commonly known as: EPI-PEN Inject into the muscle.   Fremanezumab-vfrm 225 MG/1.5ML Soaj Inject 225 mg into the skin See admin instructions.   hydrocortisone 5 MG tablet Commonly known as: CORTEF Take 10-30 mg by mouth 2 (two) times daily as needed (FOR SICK DAYS AS NEEDED). What changed: Another medication with the same name was changed. Make sure you understand how and when to take each.   hydrocortisone 10 MG tablet Commonly known as: CORTEF Take 2 tablets (20 mg total) by mouth daily AND 1 tablet (10 mg total) every evening. What changed: See the new instructions.   HYDROmorphone 2 MG tablet Commonly known as: DILAUDID Take 2 mg by mouth every 12 (twelve) hours as needed for moderate pain (pain score 4-6). What changed: Another medication with the same name was added. Make sure you understand how and when to take each.    HYDROmorphone 2 MG tablet Commonly known as: DILAUDID Take 1 tablet (2 mg total) by mouth every 6 (six) hours as needed for up to 5 days for severe pain (pain score 7-10). What changed: You were already taking a medication with the same name, and this prescription was added. Make sure you understand how and when to take each.   hydrOXYzine 25 MG tablet Commonly known as: ATARAX Take 25 mg by mouth 3 (three) times daily as needed for itching.   Linzess 72 MCG capsule Generic drug: linaclotide Take 72 mcg by mouth daily before breakfast.   mirtazapine 15 MG tablet Commonly known as: REMERON Take 15 mg by mouth at bedtime.   naloxone 4 MG/0.1ML Liqd nasal spray kit Commonly known as: NARCAN Place 1 spray into the nose once as needed (Suspected Opiod Overdose).   nystatin 100000 UNIT/ML suspension Commonly known as: MYCOSTATIN Use as directed 4 mLs in the mouth or throat 4 (four) times daily.   nystatin cream Commonly known as: MYCOSTATIN Apply 1 Application topically 2 (two) times daily.   ondansetron 4 MG disintegrating tablet Commonly known as: ZOFRAN-ODT Take 1 tablet (4 mg total) by mouth every 6 (six) hours as needed for nausea or vomiting.   ondansetron 4 MG tablet Commonly known  as: ZOFRAN Take 4 mg by mouth every 8 (eight) hours as needed for nausea or vomiting.   pantoprazole 20 MG tablet Commonly known as: PROTONIX Take 1 tablet (20 mg total) by mouth daily. What changed:  medication strength how much to take when to take this   polyethylene glycol 17 g packet Commonly known as: MIRALAX / GLYCOLAX Take 17 g by mouth daily. Start taking on: February 23, 2023   pregabalin 100 MG capsule Commonly known as: LYRICA Take 100 mg by mouth 2 (two) times daily. Takes 2 caps po q am and 2 caps po QHS   promethazine 25 MG tablet Commonly known as: PHENERGAN Take 25 mg by mouth every 8 (eight) hours as needed for nausea or vomiting. What changed: Another medication  with the same name was added. Make sure you understand how and when to take each.   promethazine 12.5 MG suppository Commonly known as: PHENERGAN Place 1 suppository (12.5 mg total) rectally every 6 (six) hours as needed for nausea or vomiting. What changed: You were already taking a medication with the same name, and this prescription was added. Make sure you understand how and when to take each.   rizatriptan 10 MG tablet Commonly known as: MAXALT Take 10 mg by mouth as needed for migraine.   scopolamine 1 MG/3DAYS Commonly known as: TRANSDERM-SCOP Place 1 patch onto the skin every 3 (three) days.   Solu-CORTEF 100 MG injection Generic drug: hydrocortisone sodium succinate Inject 100 mg into the muscle once as needed (Adrenal Emergency).   tiZANidine 2 MG tablet Commonly known as: ZANAFLEX Take 2 mg by mouth at bedtime.   triamcinolone cream 0.1 % Commonly known as: KENALOG Apply 1 Application topically 2 (two) times daily.   Ubrelvy 50 MG Tabs Generic drug: Ubrogepant Take by mouth.   Vitamin B-12 2500 MCG Subl Take 2,500 mcg by mouth daily. What changed: Another medication with the same name was removed. Continue taking this medication, and follow the directions you see here.       Subjective   Pt reports feeling improved. Her pain is moderately well controlled on her home pain regimen. She has some "queasiness" but is able to tolerate some food. She feels that she is ready to go home today.   All questions and concerns were addressed at time of discharge.  Objective  Blood pressure 110/73, pulse 80, temperature 97.8 F (36.6 C), resp. rate 18, height 5\' 7"  (1.702 m), weight 64.4 kg, last menstrual period 02/18/2021, SpO2 96%.   General: Pt is alert, awake, not in acute distress Cardiovascular: RRR, S1/S2 +, no rubs, no gallops Respiratory: CTA bilaterally, no wheezing, no rhonchi Abdominal: Soft, NT, ND, bowel sounds + Extremities: no edema, no cyanosis  The  results of significant diagnostics from this hospitalization (including imaging, microbiology, ancillary and laboratory) are listed below for reference.   Imaging studies: US PELVIS TRANSVAGINAL NON-OB (TV ONLY)  Result Date: 02/16/2023 CLINICAL DATA:  Right adnexal cyst seen on recent CT. EXAM: TRANSABDOMINAL AND TRANSVAGINAL ULTRASOUND OF PELVIS DOPPLER ULTRASOUND OF OVARIES TECHNIQUE: Both transabdominal and transvaginal ultrasound examinations of the pelvis were performed. Transabdominal technique was performed for global imaging of the pelvis including uterus, ovaries, adnexal regions, and pelvic cul-de-sac. It was necessary to proceed with endovaginal exam following the transabdominal exam to visualize the ovaries. Color and duplex Doppler ultrasound was utilized to evaluate blood flow to the ovaries. COMPARISON:  CT dated 02/13/2023 FINDINGS: Uterus Hysterectomy. Endometrium Hysterectomy. Right ovary Measurements: 4.0  x 3.8 x 4.5 cm = volume: 36 mL. There is a 3.7 x 4.3 x 4.0 cm complex cyst with lace-like architecture arising from the right ovary most consistent with a hemorrhagic cyst. No internal vascularity noted within this cyst. Left ovary Measurements: 2.5 x 1.8 x 2.0 cm = volume: 4.6 mL. Normal appearance/no adnexal mass. Pulsed Doppler evaluation of both ovaries demonstrates normal low-resistance arterial and venous waveforms. Other findings No abnormal free fluid. IMPRESSION: 1. Right ovarian complex/hemorrhagic cyst. Follow-up with ultrasound after 2 menstrual cycles recommended. 2. Unremarkable left ovary. 3. Hysterectomy. Electronically Signed   By: Elgie Collard M.D.   On: 02/16/2023 15:46   US PELVIC DOPPLER (TORSION R/O OR MASS ARTERIAL FLOW)  Result Date: 02/16/2023 CLINICAL DATA:  Right adnexal cyst seen on recent CT. EXAM: TRANSABDOMINAL AND TRANSVAGINAL ULTRASOUND OF PELVIS DOPPLER ULTRASOUND OF OVARIES TECHNIQUE: Both transabdominal and transvaginal ultrasound examinations of  the pelvis were performed. Transabdominal technique was performed for global imaging of the pelvis including uterus, ovaries, adnexal regions, and pelvic cul-de-sac. It was necessary to proceed with endovaginal exam following the transabdominal exam to visualize the ovaries. Color and duplex Doppler ultrasound was utilized to evaluate blood flow to the ovaries. COMPARISON:  CT dated 02/13/2023 FINDINGS: Uterus Hysterectomy. Endometrium Hysterectomy. Right ovary Measurements: 4.0 x 3.8 x 4.5 cm = volume: 36 mL. There is a 3.7 x 4.3 x 4.0 cm complex cyst with lace-like architecture arising from the right ovary most consistent with a hemorrhagic cyst. No internal vascularity noted within this cyst. Left ovary Measurements: 2.5 x 1.8 x 2.0 cm = volume: 4.6 mL. Normal appearance/no adnexal mass. Pulsed Doppler evaluation of both ovaries demonstrates normal low-resistance arterial and venous waveforms. Other findings No abnormal free fluid. IMPRESSION: 1. Right ovarian complex/hemorrhagic cyst. Follow-up with ultrasound after 2 menstrual cycles recommended. 2. Unremarkable left ovary. 3. Hysterectomy. Electronically Signed   By: Elgie Collard M.D.   On: 02/16/2023 15:46   US PELVIS (TRANSABDOMINAL ONLY)  Result Date: 02/14/2023 CLINICAL DATA:  Adnexal cyst seen on CT abdomen and pelvis 02/13/2023 EXAM: TRANSABDOMINAL ULTRASOUND OF PELVIS TECHNIQUE: Transabdominal ultrasound examination of the pelvis was performed including evaluation of the uterus, ovaries, adnexal regions, and pelvic cul-de-sac. COMPARISON:  None Available. FINDINGS: Uterus/Endometrium Surgically absent. Right ovary Measurements: 6.0 x 3.2 x 4.3 cm = volume: 51 mL. Exam is limited by transabdominal technique and large amount of overlying bowel gas. Normal appearance/no adnexal mass. Left ovary Not visualized. Other findings:  No abnormal free fluid. IMPRESSION: 1. No definite right adnexal mass noting limitations of transabdominal technique and  large amount of overlying bowel gas. 2. Left ovary not visualized. 3. Status post hysterectomy. Electronically Signed   By: Minerva Fester M.D.   On: 02/14/2023 21:56   CT ABDOMEN PELVIS W CONTRAST  Result Date: 02/13/2023 CLINICAL DATA:  Right flank pain, fever, history of pyelonephritis and bladder surgery EXAM: CT ABDOMEN AND PELVIS WITH CONTRAST TECHNIQUE: Multidetector CT imaging of the abdomen and pelvis was performed using the standard protocol following bolus administration of intravenous contrast. RADIATION DOSE REDUCTION: This exam was performed according to the departmental dose-optimization program which includes automated exposure control, adjustment of the mA and/or kV according to patient size and/or use of iterative reconstruction technique. CONTRAST:  OMNIPAQUE IOHEXOL 300 MG/ML  SOLN COMPARISON:  CT 01/04/2017 FINDINGS: Lower chest: No acute abnormality. Hepatobiliary: Hepatic steatosis. Normal gallbladder. No biliary dilation. Pancreas: Unremarkable. Spleen: Unremarkable. Adrenals/Urinary Tract: Normal adrenal glands. Postoperative change of cystectomy with right  lower quadrant ileal conduit. No urinary calculi or hydronephrosis. No evidence of pyelonephritis. Stomach/Bowel: Normal caliber large and small bowel. No bowel wall thickening. The appendix is not visualized.Stomach is within normal limits. Vascular/Lymphatic: No significant vascular findings are present. No enlarged abdominal or pelvic lymph nodes. Reproductive: Hysterectomy. Unremarkable left ovary. 5.2 cm cystic lesion in the expected location of the right adnexa. This abuts the ileal conduit. Other: No free intraperitoneal fluid or air. Musculoskeletal: No acute fracture. IMPRESSION: 1. 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 No urinary calculi or hydronephrosis. No evidence of pyelonephritis. 2. 5.2 cm cystic lesion in the expected location of the right adnexa. This abuts the ileal  conduit. Differential considerations are ovarian cyst or patulous ileal conduit. Follow-up ultrasound in 3-6 months is recommended. 3. Hepatic steatosis. Electronically Signed   By: Minerva Fester M.D.   On: 02/13/2023 21:39   DG Chest Port 1 View  Result Date: 02/13/2023 CLINICAL DATA:  Sepsis EXAM: PORTABLE CHEST 1 VIEW COMPARISON:  None Available. FINDINGS: The heart size and mediastinal contours are within normal limits. Both lungs are clear. The visualized skeletal structures are unremarkable. IMPRESSION: No active disease. Electronically Signed   By: Sharlet Salina M.D.   On: 02/13/2023 21:17    Labs: Basic Metabolic Panel: Recent Labs  Lab 02/17/23 0536 02/21/23 0904  NA 139 137  K 3.4* 3.4*  CL 106 105  CO2 24 23  GLUCOSE 97 89  BUN 9 12  CREATININE 0.71 0.84  CALCIUM 8.8* 8.7*   CBC: Recent Labs  Lab 02/17/23 0536  WBC 9.3  HGB 12.3  HCT 36.2  MCV 91.4  PLT 306   Microbiology: Results for orders placed or performed during the hospital encounter of 02/13/23  Culture, blood (Routine x 2)     Status: None   Collection Time: 02/13/23  3:40 PM   Specimen: BLOOD  Result Value Ref Range Status   Specimen Description BLOOD BLOOD RIGHT FOREARM  Final   Special Requests   Final    BOTTLES DRAWN AEROBIC AND ANAEROBIC Blood Culture results may not be optimal due to an excessive volume of blood received in culture bottles   Culture   Final    NO GROWTH 5 DAYS Performed at [facility], [address] [place], [place] [address]    Report Status 02/18/2023 FINAL  Final  Urine Culture (for pregnant, neutropenic or urologic patients or patients with an indwelling urinary catheter)     Status: Abnormal   Collection Time: 02/13/23  5:37 PM   Specimen: Urine, Random  Result Value Ref Range Status   Specimen Description   Final    URINE, RANDOM Performed at [facility], [address] [place], [place] [address]    Special Requests   Final     NONE Performed at [facility], [address] [place], [place] [address]    Culture (A)  Final    >=100,000 COLONIES/mL ENTEROCOCCUS FAECALIS >=100,000 COLONIES/mL KLEBSIELLA PNEUMONIAE    Report Status 02/16/2023 FINAL  Final   Organism ID, Bacteria ENTEROCOCCUS FAECALIS (A)  Final   Organism ID, Bacteria KLEBSIELLA PNEUMONIAE (A)  Final      Susceptibility   Enterococcus faecalis - MIC*    AMPICILLIN <=2 SENSITIVE Sensitive     NITROFURANTOIN <=16 SENSITIVE Sensitive     VANCOMYCIN 1 SENSITIVE Sensitive     * >=100,000 COLONIES/mL ENTEROCOCCUS FAECALIS   Klebsiella pneumoniae - MIC*    AMPICILLIN >=  32 RESISTANT Resistant     CEFAZOLIN <=4 SENSITIVE Sensitive     CEFEPIME <=0.12 SENSITIVE Sensitive     CEFTRIAXONE <=0.25 SENSITIVE Sensitive     CIPROFLOXACIN <=0.25 SENSITIVE Sensitive     GENTAMICIN <=1 SENSITIVE Sensitive     IMIPENEM <=0.25 SENSITIVE Sensitive     NITROFURANTOIN 32 SENSITIVE Sensitive     TRIMETH/SULFA <=20 SENSITIVE Sensitive     AMPICILLIN/SULBACTAM 8 SENSITIVE Sensitive     PIP/TAZO 8 SENSITIVE Sensitive ug/mL    * >=100,000 COLONIES/mL KLEBSIELLA PNEUMONIAE  Culture, blood (Routine x 2)     Status: None   Collection Time: 02/13/23 10:58 PM   Specimen: BLOOD  Result Value Ref Range Status   Specimen Description BLOOD BLOOD LEFT HAND  Final   Special Requests   Final    BOTTLES DRAWN AEROBIC AND ANAEROBIC Blood Culture results may not be optimal due to an inadequate volume of blood received in culture bottles   Culture   Final    NO GROWTH 5 DAYS Performed at [facility], [address] [place], [place] [address]    Report Status 02/20/2023 FINAL  Final  Wet prep, genital     Status: None   Collection Time: 02/19/23  5:00 PM  Result Value Ref Range Status   Yeast Wet Prep HPF POC NONE SEEN NONE SEEN Final   Trich, Wet Prep NONE SEEN NONE SEEN Final   Clue Cells Wet Prep HPF POC NONE SEEN NONE SEEN Final   WBC, Wet Prep HPF  POC <10 <10 Final   Sperm NONE SEEN  Final    Comment: Performed at [facility], [address], [place], [place] [address]   Time coordinating discharge: Over 30 minutes  Leeroy Bock, MD  [facility] 02/22/2023, 4:17 PM

## 2023-02-22 NOTE — TOC Transition Note (Signed)
Transition of Care [facility]) - CM/SW Discharge Note   Patient Details  Name: Christine Chavez MRN: [id number] Date of Birth: 1980-09-23  [facility] CM/SW Contact:  Margarito Liner, LCSW Phone Number: 02/22/2023, 4:13 PM   Clinical Narrative:  Patient has orders to discharge home today. CSW met with patient. Family friend at bedside. CSW introduced role and inquired about interest in outpatient palliative services. Patient is agreeable and prefers [facility]. Referral made to [facility]. He will reach out to her tomorrow. No further concerns. CSW signing off.   Final next level of care: Home/Self Care Barriers to Discharge: No Barriers Identified   Patient Goals and CMS Choice   Choice offered to / list presented to : Patient  Discharge Placement                  Patient to be transferred to facility by: Family friend   Patient and family notified of of transfer: 02/22/23  Discharge Plan and Services Additional resources added to the After Visit Summary for                                       Social Determinants of Health (SDOH) Interventions SDOH Screenings   Food Insecurity: No Food Insecurity (02/14/2023)  Housing: Low Risk  (02/14/2023)  Transportation Needs: No Transportation Needs (02/14/2023)  Utilities: Not At Risk (02/14/2023)  Depression (PHQ2-9): Low Risk  (10/02/2022)  Financial Resource Strain: Low Risk  (11/08/2022)   Received from [facility]  Physical Activity: Sufficiently Active (12/10/2020)   Received from [facility], [facility]  Social Connections: Moderately Integrated (12/10/2020)   Received from [facility], [facility]  Stress: No Stress Concern Present (12/10/2020)   Received from [facility], [facility]  Tobacco Use: Low Risk  (02/14/2023)     Readmission Risk Interventions     No data  to display

## 2023-02-22 NOTE — Discharge Instructions (Signed)
Please follow up with your primary care team as scheduled.  Reach out to palliative to establish home care

## 2023-02-23 NOTE — Progress Notes (Signed)
[facility] Liaison Note:   (new referral for outpatient palliative services)  Notified by [facility], Charlynn Court, LCSW  of patient/family request for [facility] Palliative [facility] services at home after discharge.  Spoke with patient via phone today to explain Palliative services.  Referral submitted today.  Please call with any hospice or outpatient palliative care related questions. Thank you for the opportunity to participate in this patient's care.  Redge Gainer, [facility] Liaison [phone]

## 2023-02-26 ENCOUNTER — Ambulatory Visit: Admission: RE | Admit: 2023-02-26 | Payer: BC Managed Care – PPO | Source: Ambulatory Visit

## 2023-02-26 ENCOUNTER — Encounter: Admission: RE | Payer: Self-pay | Source: Ambulatory Visit

## 2023-02-26 HISTORY — DX: Depression, unspecified: F32.A

## 2023-02-26 HISTORY — DX: Dizziness and giddiness: R42

## 2023-02-26 HISTORY — DX: Tubulo-interstitial nephritis, not specified as acute or chronic: N12

## 2023-02-26 HISTORY — DX: Retention of urine, unspecified: R33.9

## 2023-02-26 HISTORY — DX: Abnormal weight loss: R63.4

## 2023-02-26 HISTORY — DX: Intestinal malabsorption, unspecified: K90.9

## 2023-02-26 SURGERY — ESOPHAGOGASTRODUODENOSCOPY (EGD) WITH PROPOFOL
Anesthesia: General

## 2023-02-28 ENCOUNTER — Ambulatory Visit: Payer: 59

## 2023-02-28 ENCOUNTER — Encounter: Payer: Self-pay | Admitting: Psychiatry

## 2023-03-05 ENCOUNTER — Ambulatory Visit: Payer: BC Managed Care – PPO

## 2023-03-07 ENCOUNTER — Ambulatory Visit: Payer: BC Managed Care – PPO

## 2023-03-12 ENCOUNTER — Ambulatory Visit: Payer: BC Managed Care – PPO

## 2023-03-14 ENCOUNTER — Ambulatory Visit: Payer: BC Managed Care – PPO

## 2023-03-19 ENCOUNTER — Ambulatory Visit: Payer: Self-pay

## 2023-03-21 ENCOUNTER — Ambulatory Visit: Payer: Self-pay

## 2023-03-26 ENCOUNTER — Ambulatory Visit: Payer: Self-pay

## 2023-03-28 ENCOUNTER — Ambulatory Visit: Payer: Self-pay

## 2023-04-02 ENCOUNTER — Ambulatory Visit: Payer: Self-pay

## 2023-04-04 ENCOUNTER — Ambulatory Visit: Payer: Self-pay

## 2023-04-08 IMAGING — US US PELVIS COMPLETE WITH TRANSVAGINAL
1 series · 14 of 25 positions shown · non-contrast
Comparison: None

CLINICAL DATA: Menorrhagia with irregularity



[Series 1: us pelvis complete with transvaginal · 0.20mm/px · 14 of 61 slices shown]
[im 1/61]
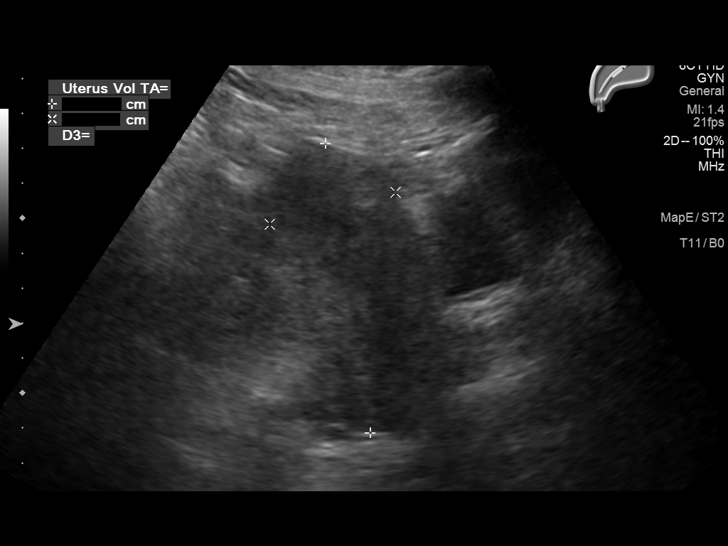
[im 6/61]
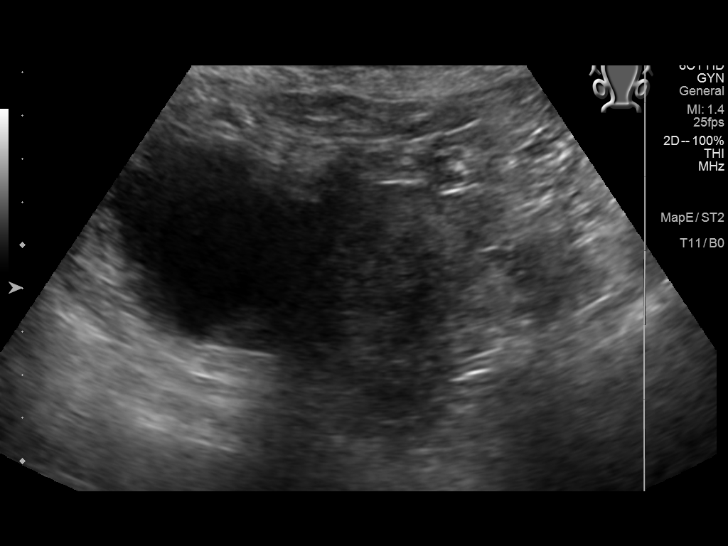
[im 11/61]
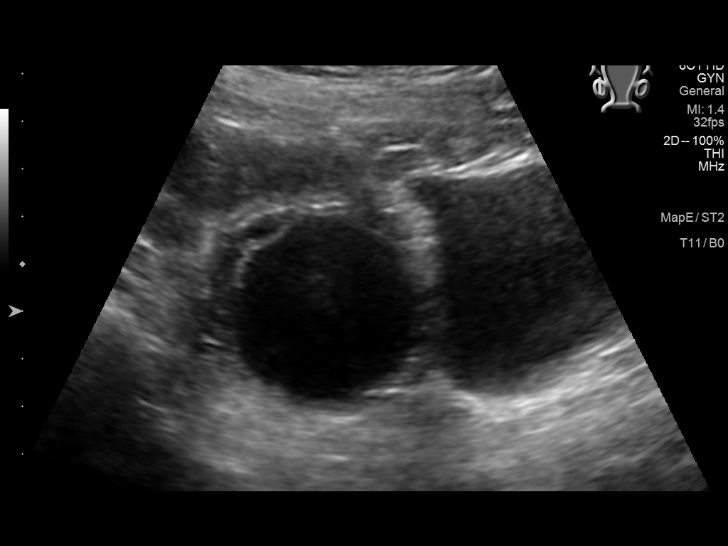
[im 16/61]
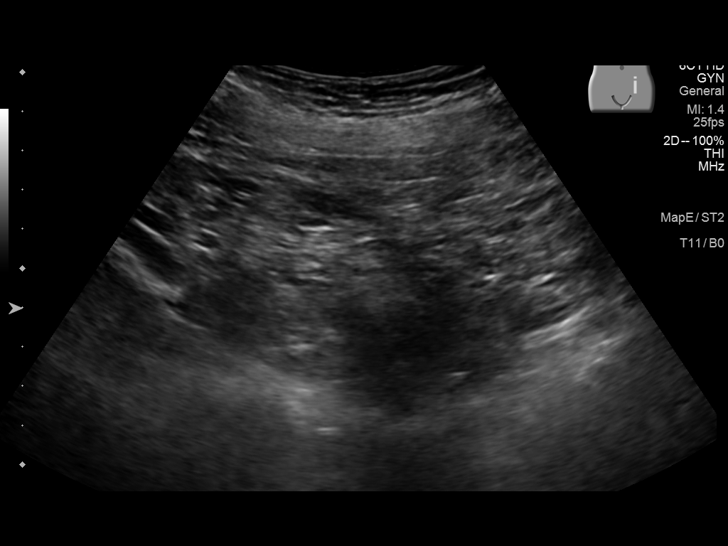
[im 21/61]
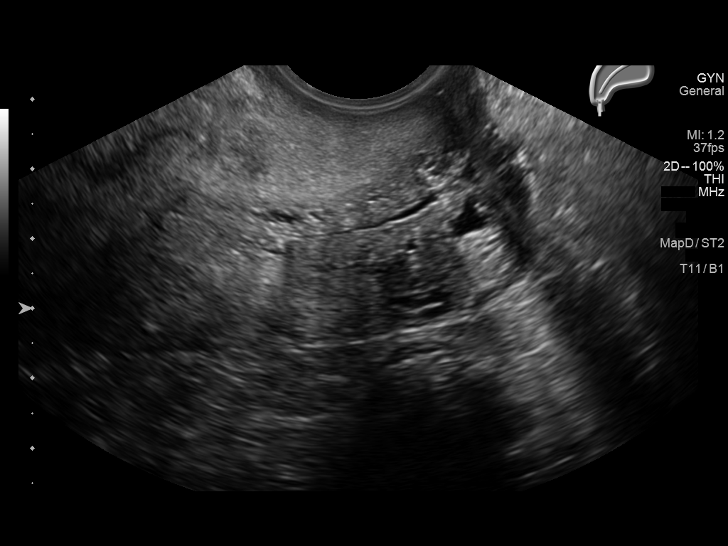
[im 23/61]
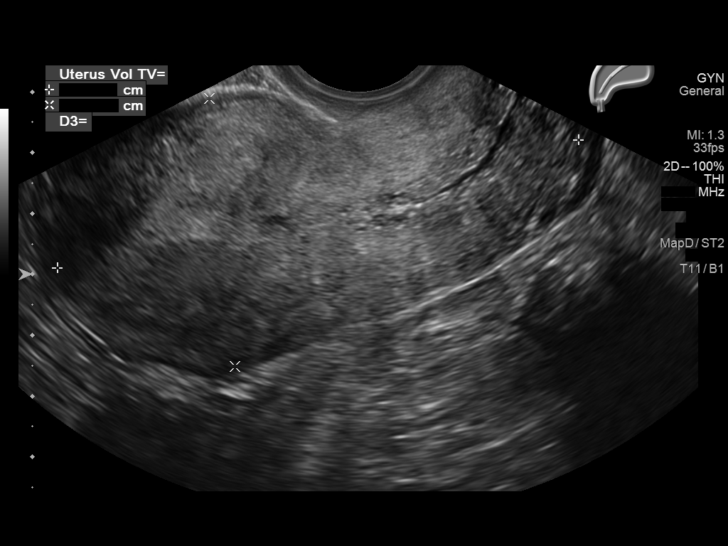
[im 28/61]
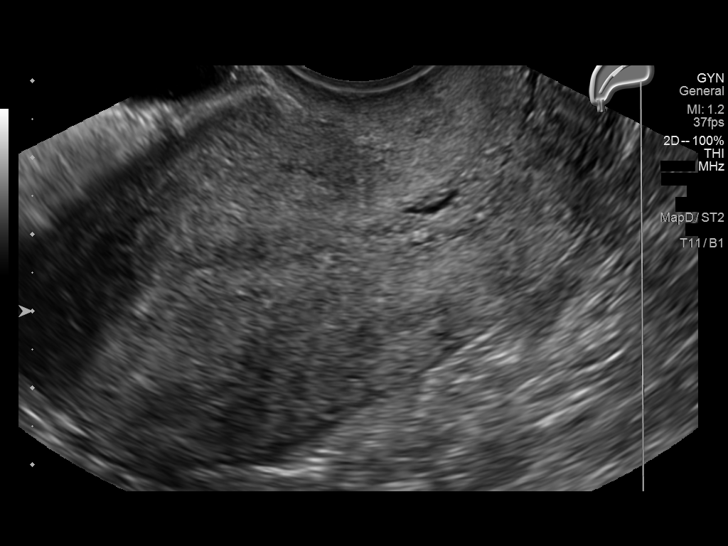
[im 33/61]
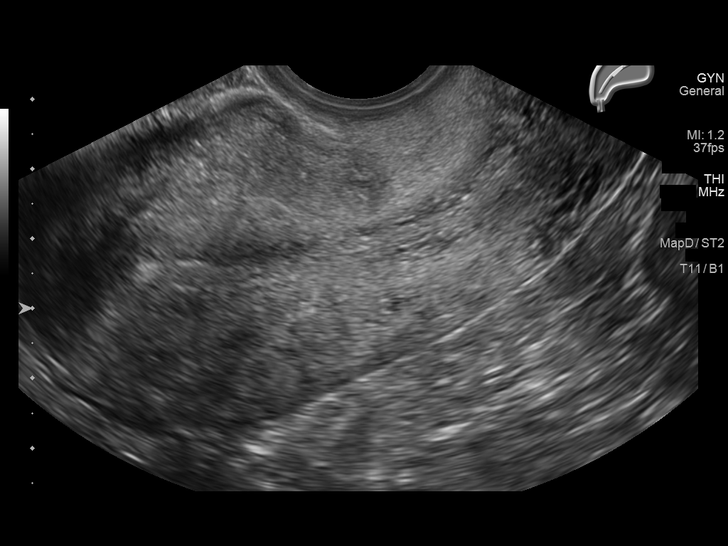
[im 38/61]
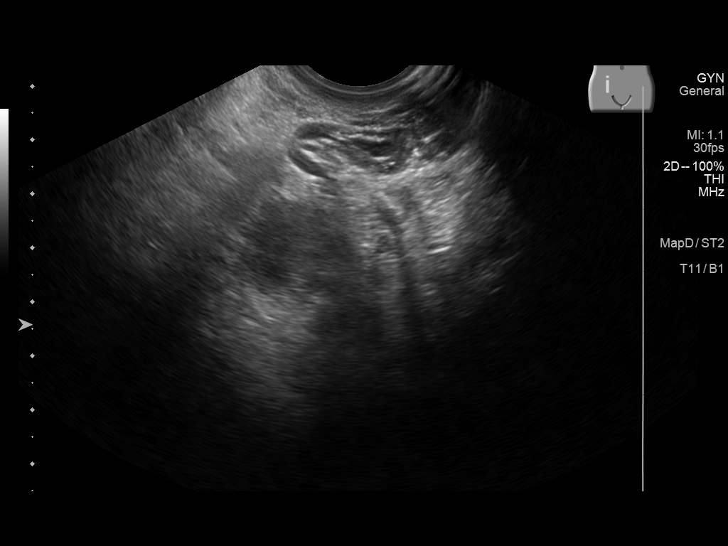
[im 41/61]
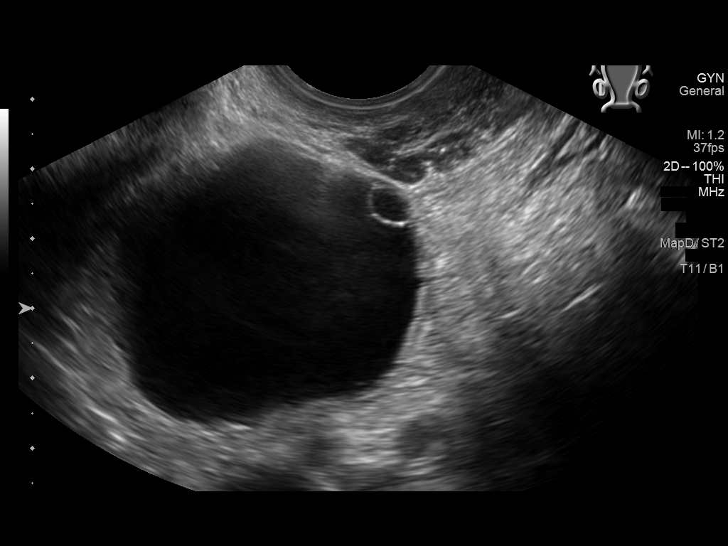
[im 46/61]
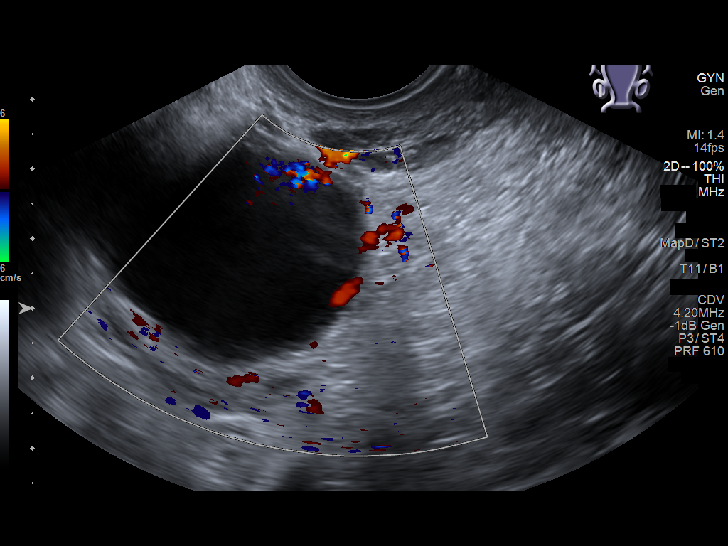
[im 51/61]
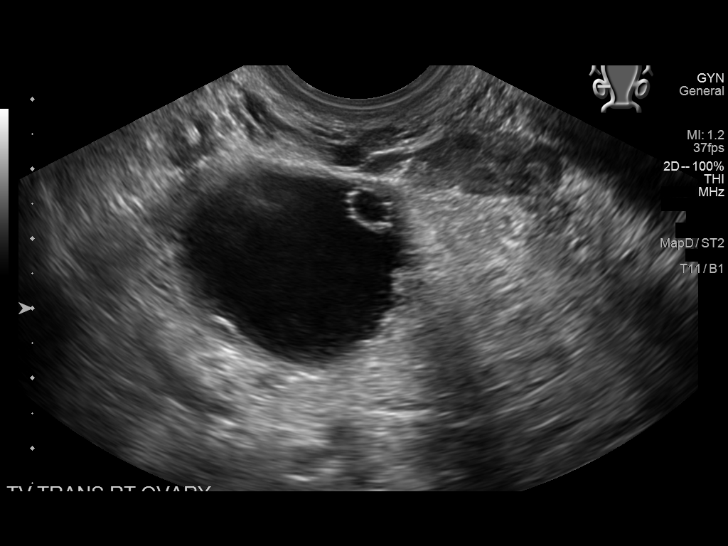
[im 56/61]
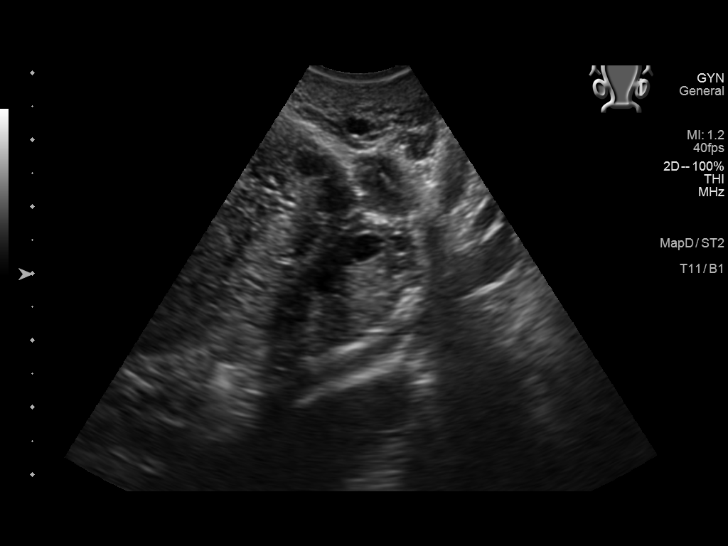
[im 61/61]
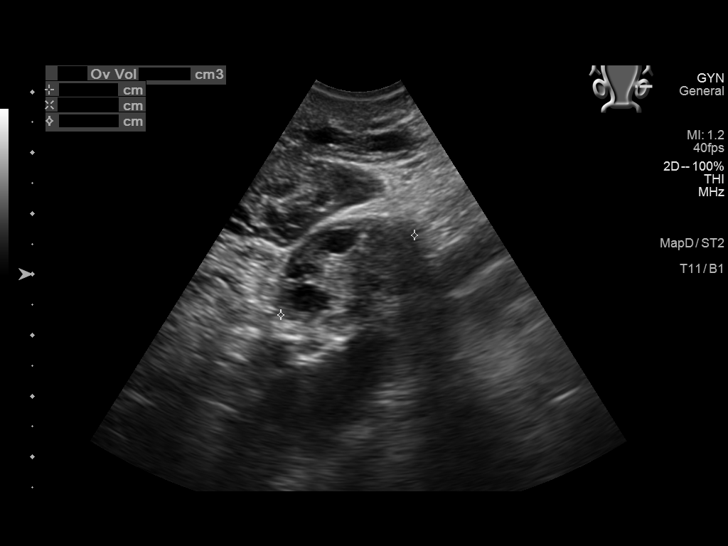

[14 of 25 positions shown; findings below may reference images not displayed]

FINDINGS: Uterus

Measurements: 8.8 x 4.4 x 5.1 cm = volume: 104 mL. No fibroids or
other mass visualized.

Endometrium

Thickness: 5.  No focal abnormality visualized.

Right ovary

Measurements: 5.0 x 4.2 x 4.8 cm = volume: 52 mL. Dominant follicle
of the right ovary measuring 4.5 x 4.5 x 4.0 cm with a peripheral
curved structure that represents a normal cumulus oophorus. No
adnexal mass.

Left ovary

Measurements: 2.4 x 1.2 x 2.6 cm = volume: 3.9 mL. Normal
appearance/no adnexal mass.

Other findings

No abnormal free fluid.
IMPRESSION: Normal appearance of the endometrium.

Dominant follicle of the right ovary measuring up to 4.5 cm. No
further follow-up is needed.

## 2023-04-09 ENCOUNTER — Ambulatory Visit: Payer: Self-pay

## 2023-04-16 ENCOUNTER — Ambulatory Visit: Payer: BC Managed Care – PPO

## 2023-04-23 ENCOUNTER — Ambulatory Visit: Payer: BC Managed Care – PPO

## 2023-04-25 ENCOUNTER — Ambulatory Visit: Payer: BC Managed Care – PPO

## 2023-04-26 ENCOUNTER — Ambulatory Visit: Payer: BC Managed Care – PPO

## 2023-04-30 ENCOUNTER — Ambulatory Visit: Payer: BC Managed Care – PPO

## 2023-05-02 ENCOUNTER — Ambulatory Visit: Payer: BC Managed Care – PPO

## 2023-05-03 ENCOUNTER — Ambulatory Visit: Payer: BC Managed Care – PPO

## 2023-05-07 ENCOUNTER — Ambulatory Visit: Payer: BC Managed Care – PPO

## 2023-05-09 ENCOUNTER — Ambulatory Visit: Payer: BC Managed Care – PPO

## 2023-05-10 ENCOUNTER — Ambulatory Visit: Payer: BC Managed Care – PPO

## 2023-05-14 ENCOUNTER — Ambulatory Visit: Payer: BC Managed Care – PPO

## 2023-05-15 ENCOUNTER — Ambulatory Visit: Payer: BC Managed Care – PPO

## 2023-05-16 ENCOUNTER — Ambulatory Visit: Payer: BC Managed Care – PPO

## 2023-05-17 ENCOUNTER — Ambulatory Visit: Payer: BC Managed Care – PPO

## 2023-05-21 ENCOUNTER — Ambulatory Visit: Payer: BC Managed Care – PPO

## 2023-05-22 ENCOUNTER — Ambulatory Visit: Payer: BC Managed Care – PPO

## 2023-05-23 ENCOUNTER — Ambulatory Visit: Payer: BC Managed Care – PPO

## 2023-05-24 ENCOUNTER — Ambulatory Visit: Payer: BC Managed Care – PPO

## 2023-05-28 ENCOUNTER — Ambulatory Visit: Payer: BC Managed Care – PPO

## 2023-05-30 ENCOUNTER — Ambulatory Visit: Payer: BC Managed Care – PPO

## 2023-05-31 ENCOUNTER — Ambulatory Visit: Payer: BC Managed Care – PPO

## 2023-06-04 ENCOUNTER — Ambulatory Visit: Payer: BC Managed Care – PPO

## 2023-06-05 ENCOUNTER — Ambulatory Visit: Payer: BC Managed Care – PPO

## 2023-06-06 ENCOUNTER — Ambulatory Visit: Payer: BC Managed Care – PPO

## 2023-06-07 ENCOUNTER — Ambulatory Visit: Payer: BC Managed Care – PPO

## 2023-06-07 ENCOUNTER — Other Ambulatory Visit: Payer: Self-pay | Admitting: Family Medicine

## 2023-06-07 DIAGNOSIS — R079 Chest pain, unspecified: Secondary | ICD-10-CM

## 2023-06-08 ENCOUNTER — Ambulatory Visit
Admission: RE | Admit: 2023-06-08 | Discharge: 2023-06-08 | Disposition: A | Discharge status: Home / Self Care | Payer: BC Managed Care – PPO | Source: Ambulatory Visit | Attending: Family Medicine | Admitting: Family Medicine

## 2023-06-08 DIAGNOSIS — R079 Chest pain, unspecified: Secondary | ICD-10-CM | POA: Diagnosis present

## 2023-06-08 MED ORDER — IOHEXOL 350 MG/ML SOLN
75.0000 mL | Freq: Once | INTRAVENOUS | Status: AC | PRN
  Administered 2023-06-08: 75 mL via INTRAVENOUS

## 2023-06-11 ENCOUNTER — Ambulatory Visit: Payer: BC Managed Care – PPO

## 2023-06-12 ENCOUNTER — Ambulatory Visit: Payer: BC Managed Care – PPO

## 2023-06-13 ENCOUNTER — Observation Stay: Payer: BC Managed Care – PPO

## 2023-06-13 ENCOUNTER — Observation Stay
Admission: EM | Admit: 2023-06-13 | Discharge: 2023-06-14 | Disposition: A | Discharge status: Home / Self Care | Payer: BC Managed Care – PPO | Attending: Hospitalist | Admitting: Hospitalist

## 2023-06-13 ENCOUNTER — Ambulatory Visit: Payer: BC Managed Care – PPO

## 2023-06-13 ENCOUNTER — Emergency Department: Payer: BC Managed Care – PPO

## 2023-06-13 ENCOUNTER — Other Ambulatory Visit: Payer: Self-pay

## 2023-06-13 DIAGNOSIS — G459 Transient cerebral ischemic attack, unspecified: Secondary | ICD-10-CM | POA: Diagnosis not present

## 2023-06-13 DIAGNOSIS — G8929 Other chronic pain: Secondary | ICD-10-CM | POA: Insufficient documentation

## 2023-06-13 DIAGNOSIS — R29818 Other symptoms and signs involving the nervous system: Secondary | ICD-10-CM | POA: Diagnosis not present

## 2023-06-13 DIAGNOSIS — G114 Hereditary spastic paraplegia: Secondary | ICD-10-CM | POA: Insufficient documentation

## 2023-06-13 DIAGNOSIS — R2990 Unspecified symptoms and signs involving the nervous system: Secondary | ICD-10-CM | POA: Diagnosis not present

## 2023-06-13 DIAGNOSIS — R600 Localized edema: Secondary | ICD-10-CM | POA: Insufficient documentation

## 2023-06-13 DIAGNOSIS — E271 Primary adrenocortical insufficiency: Secondary | ICD-10-CM | POA: Diagnosis not present

## 2023-06-13 DIAGNOSIS — Z79899 Other long term (current) drug therapy: Secondary | ICD-10-CM | POA: Insufficient documentation

## 2023-06-13 DIAGNOSIS — R55 Syncope and collapse: Secondary | ICD-10-CM

## 2023-06-13 DIAGNOSIS — R531 Weakness: Secondary | ICD-10-CM | POA: Diagnosis present

## 2023-06-13 DIAGNOSIS — M79604 Pain in right leg: Secondary | ICD-10-CM

## 2023-06-13 DIAGNOSIS — I639 Cerebral infarction, unspecified: Secondary | ICD-10-CM

## 2023-06-13 LAB — COMPREHENSIVE METABOLIC PANEL
ALT: 13 U/L (ref 0–44)
AST: 16 U/L (ref 15–41)
Albumin: 3.9 g/dL (ref 3.5–5.0)
Alkaline Phosphatase: 54 U/L (ref 38–126)
Anion gap: 13 (ref 5–15)
BUN: 9 mg/dL (ref 6–20)
CO2: 15 mmol/L — ABNORMAL LOW (ref 22–32)
Calcium: 8.9 mg/dL (ref 8.9–10.3)
Chloride: 109 mmol/L (ref 98–111)
Creatinine, Ser: 0.86 mg/dL (ref 0.44–1.00)
GFR, Estimated: >60 mL/min (ref >60)
Glucose, Bld: 94 mg/dL (ref 70–99)
Potassium: 4 mmol/L (ref 3.5–5.1)
Sodium: 137 mmol/L (ref 135–145)
Total Bilirubin: 1 mg/dL (ref 0.0–1.2)
Total Protein: 7.8 g/dL (ref 6.5–8.1)

## 2023-06-13 LAB — CBC
HCT: 42 % (ref 36.0–46.0)
Hemoglobin: 13.8 g/dL (ref 12.0–15.0)
MCH: 30.7 pg (ref 26.0–34.0)
MCHC: 32.9 g/dL (ref 30.0–36.0)
MCV: 93.5 fL (ref 80.0–100.0)
Platelets: 296 10*3/uL (ref 150–400)
RBC: 4.49 MIL/uL (ref 3.87–5.11)
RDW: 13.5 % (ref 11.5–15.5)
WBC: 9.9 10*3/uL (ref 4.0–10.5)
nRBC: 0 % (ref 0.0–0.2)

## 2023-06-13 LAB — CBG MONITORING, ED: Glucose-Capillary: 101 mg/dL — ABNORMAL HIGH (ref 70–99)

## 2023-06-13 LAB — DIFFERENTIAL
Abs Immature Granulocytes: 0.04 10*3/uL (ref 0.00–0.07)
Basophils Absolute: 0.1 10*3/uL (ref 0.0–0.1)
Basophils Relative: 1 %
Eosinophils Absolute: 0.1 10*3/uL (ref 0.0–0.5)
Eosinophils Relative: 1 %
Immature Granulocytes: 0 %
Lymphocytes Relative: 23 %
Lymphs Abs: 2.3 10*3/uL (ref 0.7–4.0)
Monocytes Absolute: 0.5 10*3/uL (ref 0.1–1.0)
Monocytes Relative: 5 %
Neutro Abs: 6.9 10*3/uL (ref 1.7–7.7)
Neutrophils Relative %: 70 %

## 2023-06-13 LAB — APTT: aPTT: 28 s (ref 24–36)

## 2023-06-13 LAB — HIV ANTIBODY (ROUTINE TESTING W REFLEX): HIV Screen 4th Generation wRfx: NONREACTIVE

## 2023-06-13 LAB — PROTIME-INR
INR: 1 (ref 0.8–1.2)
Prothrombin Time: 13.5 s (ref 11.4–15.2)

## 2023-06-13 LAB — ETHANOL: Alcohol, Ethyl (B): <10 mg/dL (ref <10)

## 2023-06-13 MED ORDER — STROKE: EARLY STAGES OF RECOVERY BOOK: Freq: Once | Status: AC

## 2023-06-13 MED ORDER — ENOXAPARIN SODIUM 40 MG/0.4ML IJ SOSY
40.0000 mg | PREFILLED_SYRINGE | INTRAMUSCULAR | Status: DC
  Administered 2023-06-13: 40 mg via SUBCUTANEOUS
  Filled 2023-06-13: qty 0.4

## 2023-06-13 MED ORDER — BACLOFEN 10 MG PO TABS
20.0000 mg | ORAL_TABLET | Freq: Two times a day (BID) | ORAL | Status: DC
  Administered 2023-06-13 – 2023-06-14 (×2): 20 mg via ORAL
  Filled 2023-06-13 (×2): qty 2

## 2023-06-13 MED ORDER — CLOPIDOGREL BISULFATE 75 MG PO TABS
75.0000 mg | ORAL_TABLET | Freq: Every day | ORAL | Status: DC
  Administered 2023-06-13 – 2023-06-14 (×2): 75 mg via ORAL
  Filled 2023-06-13 (×2): qty 1

## 2023-06-13 MED ORDER — ACETAMINOPHEN 325 MG PO TABS: 650.0000 mg | ORAL_TABLET | ORAL | Status: DC | PRN

## 2023-06-13 MED ORDER — DULOXETINE HCL 60 MG PO CPEP: 60.0000 mg | ORAL_CAPSULE | Freq: Every morning | ORAL | Status: DC

## 2023-06-13 MED ORDER — LORAZEPAM 2 MG/ML IJ SOLN
1.0000 mg | Freq: Once | INTRAMUSCULAR | Status: AC
  Administered 2023-06-13: 1 mg via INTRAVENOUS
  Filled 2023-06-13: qty 1

## 2023-06-13 MED ORDER — HYDROCORTISONE 10 MG PO TABS
10.0000 mg | ORAL_TABLET | Freq: Every day | ORAL | Status: DC
  Administered 2023-06-14: 10 mg via ORAL
  Filled 2023-06-13: qty 1

## 2023-06-13 MED ORDER — ONDANSETRON HCL 4 MG/2ML IJ SOLN
INTRAMUSCULAR | Status: AC
  Administered 2023-06-13: 4 mg
  Filled 2023-06-13: qty 2

## 2023-06-13 MED ORDER — HYDROCORTISONE 10 MG PO TABS: 20.0000 mg | ORAL_TABLET | Freq: Every day | ORAL | Status: DC

## 2023-06-13 MED ORDER — PREGABALIN 50 MG PO CAPS
100.0000 mg | ORAL_CAPSULE | Freq: Two times a day (BID) | ORAL | Status: DC
  Administered 2023-06-13 – 2023-06-14 (×2): 100 mg via ORAL
  Filled 2023-06-13 (×2): qty 2

## 2023-06-13 MED ORDER — HYDROMORPHONE HCL 2 MG PO TABS
2.0000 mg | ORAL_TABLET | Freq: Two times a day (BID) | ORAL | Status: DC | PRN
  Administered 2023-06-13 – 2023-06-14 (×2): 2 mg via ORAL
  Filled 2023-06-13 (×2): qty 1

## 2023-06-13 MED ORDER — ACETAMINOPHEN 650 MG RE SUPP: 650.0000 mg | RECTAL | Status: DC | PRN

## 2023-06-13 MED ORDER — ACETAMINOPHEN 160 MG/5ML PO SOLN: 650.0000 mg | ORAL | Status: DC | PRN

## 2023-06-13 MED ORDER — PANTOPRAZOLE SODIUM 20 MG PO TBEC
20.0000 mg | DELAYED_RELEASE_TABLET | Freq: Every day | ORAL | Status: DC
  Filled 2023-06-13: qty 1

## 2023-06-13 MED ORDER — MIRTAZAPINE 15 MG PO TABS
15.0000 mg | ORAL_TABLET | Freq: Every day | ORAL | Status: DC
  Administered 2023-06-13: 15 mg via ORAL
  Filled 2023-06-13: qty 1

## 2023-06-13 MED ORDER — LINACLOTIDE 72 MCG PO CAPS
72.0000 ug | ORAL_CAPSULE | Freq: Every day | ORAL | Status: DC
  Filled 2023-06-13: qty 1

## 2023-06-13 MED ORDER — SODIUM CHLORIDE 0.9% FLUSH
3.0000 mL | Freq: Once | INTRAVENOUS | Status: AC
  Administered 2023-06-13: 3 mL via INTRAVENOUS

## 2023-06-13 MED ORDER — HYDROCORTISONE 5 MG PO TABS
5.0000 mg | ORAL_TABLET | Freq: Every evening | ORAL | Status: DC
  Administered 2023-06-13 – 2023-06-14 (×2): 5 mg via ORAL
  Filled 2023-06-13 (×3): qty 1

## 2023-06-13 MED ORDER — HYDROCORTISONE 10 MG PO TABS: 10.0000 mg | ORAL_TABLET | Freq: Every evening | ORAL | Status: DC

## 2023-06-13 NOTE — ED Notes (Signed)
 Code Stroke called to ED secretary and CT called- patient to go to CT 1 then room 9.

## 2023-06-13 NOTE — Progress Notes (Signed)
       CROSS COVER NOTE  NAME: Christine Chavez MRN: [id number] DOB : 01-19-1981 ATTENDING PHYSICIAN: Darlin Priestly, MD    Date of Service   06/13/2023   HPI/Events of Note   Message received from Dr Fran Lowes  via secure chat "pls call [phone] from radiology. I got paged. thx " MRI results received from [facility] "1. 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 2. Adjacent small acute infarct within the posterior right frontal lobe white matter. 3. Background moderate multifocal T2 FLAIR hyperintense signal abnormality within the cerebral white matter (with a subcortical white matter predominance), greater than expected for age. These signal changes are nonspecific and differential considerations include accelerated chronic small vessel ischemic disease, sequelae of chronic migraine headaches, sequelae of vasculitis/hypercoagulable state and sequelae of a prior infectious/inflammatory process, among others.   MRA head:   Significantly motion degraded examination. No proximal intracranial large vessel occlusion is identified. Motion artifact precludes accurate assessment for stenoses and for vessel irregularity.       Interventions   Assessment/Plan: Discussed report with Dr Fran Lowes via secure chat, Nothing new to add to plan of care at this time      Donnie Mesa NP [facility] Cross Cover 7pm-7am - check amion for availability Pager [phone]

## 2023-06-13 NOTE — Code Documentation (Signed)
 Stroke Response Nurse Documentation Code Documentation  Christine Chavez is a 43 y.o. female arriving to [facility] via [employer] on 06/13/2023 with past medical hx of bladder removal surgery, urostomy,  On No antithrombotic. Code stroke was activated by ED.   Patient from doctors office where she was LKW at 0900 and now complaining of left sided weakness and aphasia. Patient recently discharged from a 28 day hospital stay for kidney infection. This morning while getting ready, she suddenly had difficulty holding her hairdryer with her left hand and was unable to get words out when trying to call husband for help. She reports she "knew what she wanted to say, but couldn't get the words out."  Symptoms improved but did not fully resolve. Went to PCP appointment at 1130 and was sent to ED for evaluation.   Stroke team at the bedside on patient arrival. Labs drawn and patient cleared for CT by Dr. Lenard Lance. Patient to CT with team. NIHSS 4, see documentation for details and code stroke times. Patient with bilateral arm weakness and bilateral leg weakness on exam. Endorses feeling more weak than normal on left. The following imaging was completed:  CT Head. Patient is not a candidate for IV Thrombolytic due to too mild to treat, symptoms improving, per MD. Patient is not a candidate for IR due to exam not consistent with LVO, per MD.   Care Plan: every 2 hour NIHSS and vital signs, swallow screen per order.   Bedside handoff with ED RN Orlinda Blalock  Stroke Response RN

## 2023-06-13 NOTE — ED Provider Notes (Signed)
 [facility] Provider Note    Event Date/Time   First MD Initiated Contact with Patient 06/13/23 1252     (approximate)  History   Chief Complaint: Weakness  HPI  Haileyann Avynn Klassen is a 43 y.o. 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  According to the patient around 9:30 AM she developed acute onset of difficulty speaking and bilateral upper extremity weakness.  Patient states a history of lower extremity weakness but states that her upper extremities usually have good strength.  Patient states she was dropping items.  She called her doctor who encouraged her to go to the ER however patient states she strongly did not want to go the hospital and the doctor agreed to see her.  Patient states her PCP had her do a neurologic exam and she was not doing very well and the neurologic exam so they sent her to the emergency department for further workup and treatment.  Code stroke was activated and neurology saw in tandem with myself.  Patient states prior to yesterday she was feeling well denies any recent cough congestion fever vomiting or diarrhea.  Physical Exam   Triage Vital Signs: ED Triage Vitals  Encounter Vitals Group     BP 06/13/23 1320 126/87     Systolic BP Percentile --      Diastolic BP Percentile --      Pulse Rate 06/13/23 1318 88     Resp 06/13/23 1318 15     Temp 06/13/23 1333 98.8 F (37.1 C)     Temp Source 06/13/23 1333 Oral     SpO2 06/13/23 1318 100 %     Weight --      Height --      Head Circumference --      Peak Flow --      Pain Score 06/13/23 1328 0     Pain Loc --      Pain Education --      Exclude from Growth Chart --     Most recent vital signs: Vitals:   06/13/23 1330 06/13/23 1333  BP: 115/78    Pulse: 83   Resp: 14   Temp:  98.8 F (37.1 C)  SpO2: 99%     General: Awake, no distress.  CV:  Good peripheral perfusion.  Regular rate and rhythm  Resp:  Normal effort.  Equal breath sounds bilaterally.  Abd:  No distention.  Soft, nontender.  No rebound or guarding.  ED Results / Procedures / Treatments   EKG  EKG viewed and interpreted by myself shows a normal sinus rhythm 87 bpm with a narrow QRS, normal axis, normal intervals, no concerning ST changes.  RADIOLOGY  I reviewed interpreted CT scan images of the head I do not appreciate any large bleed on my evaluation. Radiology is read the CT scan as negative.   MEDICATIONS ORDERED IN ED: Medications  sodium chloride flush (NS) 0.9 % injection 3 mL (has no administration in time range)  LORazepam (ATIVAN) injection 1 mg (has no administration in time range)  ondansetron (ZOFRAN) 4 MG/2ML injection (4 mg  Given 06/13/23 1330)     IMPRESSION / MDM / ASSESSMENT AND PLAN / ED COURSE  I reviewed the triage vital signs and the nursing notes.  Patient's presentation is most consistent  with acute presentation with potential threat to life or bodily function.  Patient presents to the emergency department for acute onset of upper extremity weakness difficulty speaking starting around 9:30 AM.  Code stroke activated upon arrival.  Neurology has seen and evaluated the patient in conjunction with myself.  Symptoms have significantly improved for patient she now has good strength again in her extremities although does not feel like she is totally back to baseline.  Denies any trouble speaking now.  Given the patient's improving symptoms neurology states not a TNK candidate but would like the patient admitted for further workup and treatment.  Patient agreeable to this plan of care.  Patient's lab work so far shows a reassuring chemistry negative ethanol, normal INR.  Will admit to the hospital service for further workup and  treatment.  FINAL CLINICAL IMPRESSION(S) / ED DIAGNOSES   TIA   Note:  This document was prepared using Dragon voice recognition software and may include unintentional dictation errors.   Minna Antis, MD 06/13/23 1359

## 2023-06-13 NOTE — Progress Notes (Signed)
 CODE STROKE- PHARMACY COMMUNICATION   Time CODE STROKE called/page received: 1255  Time response to CODE STROKE was made (in person or via phone): In person, 1301  Time Stroke Kit retrieved from [facility] (only if needed): No thrombolytic per neurologist  Name of Provider/Nurse contacted: Dr. Selina Cooley  Past Medical History:  Diagnosis Date   Bladder retention    Complication of anesthesia    ? seizures after anesthesia    Depression    Diarrhea due to malabsorption    Dizziness    Headache    Migraines    Neurogenic bladder    Pyelonephritis    Renal disorder    Vision abnormalities    Weight loss    Prior to Admission medications   Medication Sig Start Date End Date Taking? Authorizing Provider  baclofen (LIORESAL) 20 MG tablet Take 20 mg by mouth 2 (two) times daily. 12/08/21 11/02/23  [provider]  belladonna-opium (B&O SUPPRETTES) 16.2-30 MG suppository Place 30 mg rectally every 8 (eight) hours as needed for pain.    [provider]  Cholecalciferol (D 1000) 25 MCG (1000 UT) capsule Take 1,000 Units by mouth daily. 01/19/22   [provider]  clotrimazole (LOTRIMIN) 1 % cream Apply 1 Application topically 2 (two) times daily. 12/28/22 12/28/23  [provider]  Cyanocobalamin (VITAMIN B-12) 2500 MCG SUBL Take 2,500 mcg by mouth daily.    [provider]  diazepam (VALIUM) 2 MG tablet Take 2 mg by mouth every 6 (six) hours as needed for anxiety or muscle spasms (Vaginal application for PFM spasms). Patient not taking: Reported on 02/14/2023    [provider]  DULoxetine (CYMBALTA) 60 MG capsule Take 60 mg by mouth in the morning. 12/03/20   [provider]  EPINEPHrine 0.3 mg/0.3 mL IJ SOAJ injection Inject into the muscle. 08/31/20   [provider]  Fremanezumab-vfrm 225 MG/1.5ML SOAJ Inject 225 mg into the skin See admin instructions.    [provider]  hydrocortisone (CORTEF) 10 MG tablet Take 2  tablets (20 mg total) by mouth daily AND 1 tablet (10 mg total) every evening. 02/22/23   Leeroy Bock, MD  hydrocortisone (CORTEF) 5 MG tablet Take 10-30 mg by mouth 2 (two) times daily as needed (FOR SICK DAYS AS NEEDED). 12/21/22   [provider]  HYDROmorphone (DILAUDID) 2 MG tablet Take 2 mg by mouth every 12 (twelve) hours as needed for moderate pain (pain score 4-6). 12/21/22   [provider]  hydrOXYzine (ATARAX) 25 MG tablet Take 25 mg by mouth 3 (three) times daily as needed for itching. 01/18/23   [provider]  linaclotide Karlene Einstein) 72 MCG capsule Take 72 mcg by mouth daily before breakfast. 01/19/22   [provider]  mirtazapine (REMERON) 15 MG tablet Take 15 mg by mouth at bedtime. 11/05/20   [provider]  naloxone [facility]) nasal spray 4 mg/0.1 mL Place 1 spray into the nose once as needed (Suspected Opiod Overdose). 02/02/22   [provider]  nystatin (MYCOSTATIN) 100000 UNIT/ML suspension Use as directed 4 mLs in the mouth or throat 4 (four) times daily. 12/02/22   [provider]  ondansetron (ZOFRAN) 4 MG tablet Take 4 mg by mouth every 8 (eight) hours as needed for nausea or vomiting.    [provider]  ondansetron (ZOFRAN-ODT) 4 MG disintegrating tablet Take 1 tablet (4 mg total) by mouth every 6 (six) hours as needed for nausea or vomiting. 02/22/23  Leeroy Bock, MD  pantoprazole (PROTONIX) 20 MG tablet Take 1 tablet (20 mg total) by mouth daily. 02/22/23 03/24/23  Leeroy Bock, MD  pregabalin (LYRICA) 100 MG capsule Take 100 mg by mouth 2 (two) times daily. Takes 2 caps po q am and 2 caps po QHS 02/14/19   [provider]  promethazine (PHENERGAN) 12.5 MG suppository Place 1 suppository (12.5 mg total) rectally every 6 (six) hours as needed for nausea or vomiting. 02/22/23   Leeroy Bock, MD  promethazine (PHENERGAN) 25 MG tablet Take 25 mg by mouth every 8 (eight) hours as  needed for nausea or vomiting. 01/19/22   [provider]  rizatriptan (MAXALT) 10 MG tablet Take 10 mg by mouth as needed for migraine.    [provider]  scopolamine (TRANSDERM-SCOP) 1 MG/3DAYS Place 1 patch onto the skin every 3 (three) days. 10/16/22   [provider]  SOLU-CORTEF 100 MG injection Inject 100 mg into the muscle once as needed (Adrenal Emergency). 12/21/22   [provider]  tiZANidine (ZANAFLEX) 2 MG tablet Take 2 mg by mouth at bedtime. 12/19/20   [provider]  triamcinolone cream (KENALOG) 0.1 % Apply 1 Application topically 2 (two) times daily. 12/20/22 12/20/23  [provider]  UBRELVY 50 MG TABS Take by mouth. 11/24/20   [provider]    Orson Aloe ,PharmD Clinical Pharmacist  06/13/2023  1:03 PM

## 2023-06-13 NOTE — ED Notes (Signed)
 [facility] called for Code stroke, Talked with [facility] @ 12:53

## 2023-06-13 NOTE — Progress Notes (Signed)
 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 No other needs voiced at this time. Pt given IV ativan prior to MRI Reva Bores 06/13/23 5:40 PM

## 2023-06-13 NOTE — ED Notes (Signed)
 1254 Cart activation for pt in CT who arrived POV with reported aphasia and left side weakness. Stroke nurse present in CT at time of activation 1255 Page to Dr Selina Cooley 1256 Dr Selina Cooley in Ct to perform exam 1305 Dismissed from camera.  Pt is not tnk candidate as symptoms are resolving and NIH is low.

## 2023-06-13 NOTE — Consult Note (Addendum)
 NEUROLOGY CONSULT NOTE   Date of service: June 13, 2023 Patient Name: Jerald Villalona MRN:  [id number] DOB:  Jul 04, 1980 Chief Complaint: L sided weakness and aphasia Requesting Provider: Darlin Priestly, MD  History of Present Illness  Jessica Yulia Ulrich is a 43 y.o. 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  Last known well was 9:00 AM.  She suddenly became unable to get her words out.  She was significantly weak on the left upper extremity, more than her baseline.  She had milder acute worsening of her chronic left lower extremity weakness as well.  Her symptoms significantly improved and she only had very mild difference in strength with her left side slightly weaker than her right.  She had drift in all extremities but not to bed.  NIH stroke scale was 4.  CT showed no acute process.  TNK was not administered due to nearly nonfocal exam.  Her speech was completely normal at that point.  LKW: 0900 Modified rankin score: 3-Moderate disability-requires help but walks WITHOUT assistance IV Thrombolysis: No, mild and improving sx  NIHSS components Score: Comment  1a Level of Conscious 0[x]  1[]  2[]  3[]      1b LOC Questions 0[x]  1[]  2[]       1c LOC Commands 0[x]  1[]  2[]       2 Best Gaze 0[x]  1[]  2[]       3 Visual 0[x]  1[]  2[]  3[]      4 Facial Palsy 0[x]  1[]  2[]  3[]      5a Motor Arm - left 0[]  1[x]  2[]  3[]  4[]  UN[]    5b Motor Arm - Right 0[]  1[x]  2[]  3[]  4[]  UN[]    6a Motor Leg - Left 0[]  1[x]  2[]  3[]  4[]  UN[]    6b Motor Leg - Right 0[]  1[x]  2[]  3[]  4[]  UN[]    7 Limb Ataxia 0[x]  1[]  2[]  3[]  UN[]     8 Sensory 0[x]  1[]  2[]  UN[]      9 Best Language 0[x]  1[]  2[]  3[]      10 Dysarthria 0[x]  1[]  2[]  UN[]      11 Extinct. and Inattention  0[x]  1[]  2[]       TOTAL:  4      ROS   Comprehensive ROS performed and pertinent positives documented in HPI   Past History   Past Medical History:  Diagnosis Date   Bladder retention    Complication of anesthesia    ? seizures after anesthesia    Depression    Diarrhea due to malabsorption    Dizziness    Headache    Migraines    Neurogenic bladder    Pyelonephritis    Renal disorder    Vision abnormalities    Weight loss     Past Surgical History:  Procedure Laterality Date   ANTERIOR CRUCIATE LIGAMENT REPAIR  1997   APPENDECTOMY     BLADDER REMOVAL     COLONOSCOPY WITH PROPOFOL N/A 11/11/2018   Procedure: COLONOSCOPY WITH PROPOFOL;  Surgeon: Christena Deem, MD;  Location: [facility] ENDOSCOPY;  Service: Endoscopy;  Laterality: N/A;   CYSTOSCOPY WITH STENT PLACEMENT Right 04/17/2016   Procedure: CYSTOSCOPY WITH STENT PLACEMENT;  Surgeon: Malen Gauze, MD;  Location: [facility] ORS;  Service: Urology;  Laterality: Right;   ESOPHAGOGASTRODUODENOSCOPY (EGD) WITH PROPOFOL N/A 11/11/2018   Procedure: ESOPHAGOGASTRODUODENOSCOPY (  EGD) WITH PROPOFOL;  Surgeon: Christena Deem, MD;  Location: [facility] ENDOSCOPY;  Service: Endoscopy;  Laterality: N/A;   EXPLORATORY LAPAROTOMY  1999   HIP SURGERY Right    KIDNEY STONE SURGERY  04/2016   REVISION UROSTOMY CUTANEOUS     REVISION UROSTOMY CUTANEOUS  01/10/2018   SUPRAPUBIC CATHETER PLACEMENT  08/2017   TONSILLECTOMY      Family History: Family History  Problem Relation Age of Onset   Hypertension Mother    Atrial fibrillation Father    Healthy Brother    Depression Brother    Arthritis/Rheumatoid Paternal Grandmother    Healthy Brother     Social History  reports that she has never smoked. She has never used smokeless tobacco. She reports that she does not drink alcohol and does not use drugs.  Allergies  Allergen Reactions   Aspirin Shortness Of Breath, Swelling and Anaphylaxis   Ibuprofen Shortness Of Breath,  Swelling and Anaphylaxis   Ketorolac Anaphylaxis   Morphine Hives    Itching as well. This has happened twice. Last witness w/ itchy hived all over pt's neck, check, torso, and abdomen.    Dilaudid ok  Itching as well. This has happened twice. Last witness w/ itchy hived all over pt's neck, check, torso, and abdomen.  Dilaudid ok   Nsaids Anaphylaxis   Vibegron Anaphylaxis   Bupropion Palpitations   Morphine And Codeine Hives   Iron Dextran Hives    Developed LUE urticaria about 15 minutes into iron dextran infusion.   Methenamine Hippurate Rash    Medications   Current Facility-Administered Medications:    LORazepam (ATIVAN) injection 1 mg, 1 mg, Intravenous, Once, Minna Antis, MD   sodium chloride flush (NS) 0.9 % injection 3 mL, 3 mL, Intravenous, Once, Minna Antis, MD  Current Outpatient Medications:    baclofen (LIORESAL) 20 MG tablet, Take 20 mg by mouth 2 (two) times daily., Disp: , Rfl:    belladonna-opium (B&O SUPPRETTES) 16.2-30 MG suppository, Place 30 mg rectally every 8 (eight) hours as needed for pain., Disp: , Rfl:    Cholecalciferol (D 1000) 25 MCG (1000 UT) capsule, Take 1,000 Units by mouth daily., Disp: , Rfl:    clotrimazole (LOTRIMIN) 1 % cream, Apply 1 Application topically 2 (two) times daily., Disp: , Rfl:    Cyanocobalamin (VITAMIN B-12) 2500 MCG SUBL, Take 2,500 mcg by mouth daily., Disp: , Rfl:    diazepam (VALIUM) 2 MG tablet, Take 2 mg by mouth every 6 (six) hours as needed for anxiety or muscle spasms (Vaginal application for PFM spasms). (Patient not taking: Reported on 02/14/2023), Disp: , Rfl:    DULoxetine (CYMBALTA) 60 MG capsule, Take 60 mg by mouth in the morning., Disp: , Rfl:    EPINEPHrine 0.3 mg/0.3 mL IJ SOAJ injection, Inject into the muscle., Disp: , Rfl:    Fremanezumab-vfrm 225 MG/1.5ML SOAJ, Inject 225 mg into the skin See admin instructions., Disp: , Rfl:    hydrocortisone (CORTEF) 10 MG tablet, Take 2 tablets (20 mg  total) by mouth daily AND 1 tablet (10 mg total) every evening., Disp: , Rfl:    hydrocortisone (CORTEF) 5 MG tablet, Take 10-30 mg by mouth 2 (two) times daily as needed (FOR SICK DAYS AS NEEDED)., Disp: , Rfl:    HYDROmorphone (DILAUDID) 2 MG tablet, Take 2 mg by mouth every 12 (twelve) hours as needed for moderate pain (pain score 4-6)., Disp: , Rfl:    hydrOXYzine (ATARAX) 25 MG tablet, Take 25 mg  by mouth 3 (three) times daily as needed for itching., Disp: , Rfl:    linaclotide (LINZESS) 72 MCG capsule, Take 72 mcg by mouth daily before breakfast., Disp: , Rfl:    mirtazapine (REMERON) 15 MG tablet, Take 15 mg by mouth at bedtime., Disp: , Rfl:    naloxone (NARCAN) nasal spray 4 mg/0.1 mL, Place 1 spray into the nose once as needed (Suspected Opiod Overdose)., Disp: , Rfl:    nystatin (MYCOSTATIN) 100000 UNIT/ML suspension, Use as directed 4 mLs in the mouth or throat 4 (four) times daily., Disp: , Rfl:    ondansetron (ZOFRAN) 4 MG tablet, Take 4 mg by mouth every 8 (eight) hours as needed for nausea or vomiting., Disp: , Rfl:    ondansetron (ZOFRAN-ODT) 4 MG disintegrating tablet, Take 1 tablet (4 mg total) by mouth every 6 (six) hours as needed for nausea or vomiting., Disp: 20 tablet, Rfl: 0   pantoprazole (PROTONIX) 20 MG tablet, Take 1 tablet (20 mg total) by mouth daily., Disp: 30 tablet, Rfl: 0   pregabalin (LYRICA) 100 MG capsule, Take 100 mg by mouth 2 (two) times daily. Takes 2 caps po q am and 2 caps po QHS, Disp: , Rfl:    promethazine (PHENERGAN) 12.5 MG suppository, Place 1 suppository (12.5 mg total) rectally every 6 (six) hours as needed for nausea or vomiting., Disp: 12 each, Rfl: 0   promethazine (PHENERGAN) 25 MG tablet, Take 25 mg by mouth every 8 (eight) hours as needed for nausea or vomiting., Disp: , Rfl:    rizatriptan (MAXALT) 10 MG tablet, Take 10 mg by mouth as needed for migraine., Disp: , Rfl:    scopolamine (TRANSDERM-SCOP) 1 MG/3DAYS, Place 1 patch onto the skin  every 3 (three) days., Disp: , Rfl:    SOLU-CORTEF 100 MG injection, Inject 100 mg into the muscle once as needed (Adrenal Emergency)., Disp: , Rfl:    tiZANidine (ZANAFLEX) 2 MG tablet, Take 2 mg by mouth at bedtime., Disp: , Rfl:    triamcinolone cream (KENALOG) 0.1 %, Apply 1 Application topically 2 (two) times daily., Disp: , Rfl:    UBRELVY 50 MG TABS, Take by mouth., Disp: , Rfl:   Vitals   Vitals:   2023-07-02 1320 07/02/23 1321 07-02-23 1330 2023/07/02 1333  BP: 126/87 126/87 115/78   Pulse: 87  83   Resp: 13  14   Temp:    98.8 F (37.1 C)  TempSrc:    Oral  SpO2: 100%  99%     There is no height or weight on file to calculate BMI.  Physical Exam   Gen: patient lying in bed, NAD CV: extremities appear well-perfused Resp: normal WOB  Neurologic Examination   MS: alert, oriented x4, follows commands Speech: no dysarthria, no aphasia CN: PERRL, VFF, EOMI, sensation intact, face symmetric, hearing intact to voice Motor: drift in all extremities but not to bed. L grip strength minimally reduced compared to R Sensory: SILT Coordination: FNF with dysmetria but not ataxia bilat Gait: deferred  Labs/Imaging/Neurodiagnostic studies   CBC:  Recent Labs  Lab 07/02/23 1245  WBC 9.9  NEUTROABS 6.9  HGB 13.8  HCT 42.0  MCV 93.5  PLT 296   Basic Metabolic Panel:  Lab Results  Component Value Date   NA 137 07/02/23   K 4.0 07-02-23   CO2 15 (L) 02-Jul-2023   GLUCOSE 94 07/02/2023   BUN 9 07/02/2023   CREATININE 0.86 07/02/23   CALCIUM 8.9 07-02-2023  GFRNONAA >60 06/13/2023   GFRAA >60 11/12/2018   Lipid Panel: No results found for: "LDLCALC" HgbA1c:  Lab Results  Component Value Date   HGBA1C 5.0 04/18/2016   Urine Drug Screen: No results found for: "LABOPIA", "COCAINSCRNUR", "LABBENZ", "AMPHETMU", "THCU", "LABBARB"  Alcohol Level     Component Value Date/Time   ETH <10 06/13/2023 1245   INR  Lab Results  Component Value Date   INR 1.0  06/13/2023   APTT  Lab Results  Component Value Date   APTT 28 06/13/2023   AED levels: No results found for: "PHENYTOIN", "ZONISAMIDE", "LAMOTRIGINE", "LEVETIRACETA"  CT Head without contrast(Personally reviewed): Mild for age nonspecific cerebral white matter changes. ASPECTS 10. No acute intracranial hemorrhage or cortically based infarct.   ASSESSMENT   Brittyn Elener Custodio is a 43 y.o. 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  Her symptoms significantly improved and she only had very mild difference in strength with her left side slightly weaker than her right.  She had drift in all extremities but not to bed.  NIH stroke scale was 4.  CT showed no acute process.  TNK was not administered due to nearly nonfocal exam.  Her speech was completely normal at that point. Sx concerning for small stroke or TIA.  RECOMMENDATIONS   - Admit for stroke workup - Permissive HTN x48 hrs from sx onset or until stroke ruled out by MRI goal BP <220/120. PRN labetalol or hydralazine if BP above these parameters. Avoid oral antihypertensives. - MRI brain wo contrast - MRA head - Carotid US - TTE w/ bubble - Check A1c and LDL + add statin per guidelines - plavix 75mg  daily (patient is allergic to aspirin) - q4 hr neuro checks - STAT head CT for any change in neuro exam - Tele - PT/OT/SLP - Stroke education - Amb referral to neurology upon discharge   Will continue to follow  ______________________________________________________________________    Signed, Jefferson Fuel, MD [facility]

## 2023-06-13 NOTE — ED Triage Notes (Signed)
 LKW at 0930. Pt sts that she was not able to talk and was extremely weak on the left arm. Pt sts that her legs are normally weak. Pt sts that she was recently d/c from [facility] for the last 28 days.

## 2023-06-13 NOTE — Progress Notes (Signed)
   06/13/23 1315  Spiritual Encounters  Type of Visit Initial  Care provided to: Pt and family  Conversation partners present during encounter Nurse  Reason for visit Code  OnCall Visit Yes   Chaplain received a CODE STROKE to the Chaplain phone and visited the patient's mother and husband to provide a compassionate presence and reflective listening.  Patient returned from CT and Chaplain made introduction and shared that [employer] is available, if needed.    Rev. Rana M. Earlene Plater, MDiv Chaplain Resident [place]

## 2023-06-13 NOTE — H&P (Signed)
 History and Physical    [name]:[id number] DOB: 1980-06-06 DOA: 06/13/2023  PCP: Ardyth Man, PA-C  Patient coming from: home  I have personally briefly reviewed patient's old medical records in [facility]  Chief Complaint: left UE weakness and difficulty finding words.  HPI: Christine Chavez is a 43 y.o. 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    Pt reported this morning when she was seated grooming herself, she had difficulty holding her brush in her left hand or raise her left arm up.  Pt also had some minor weakness in her right arm, but not as bad.  Pt has baseline LE weakness, but UE weakness was new.  Pt then started having trouble finding her words.  Pt didn't want to come to the hospital, but had a scheduled PCP appt, so she went to that appt and was told to come to the ED.  No headache.    Of note, pt had a long hospitalization at [facility] from 04/30/23 to 05/27/23 and still felt not quite recovered from that.   ED Course: initial vitals: afebrile, pulse 87, BP 126/87, RR 13, sating 100% on room air.  Labs largely unremarkable.  Code stroke was called.  Neuro Dr. Selina Cooley saw pt in the ED.  CT head showed no acute process.  TNK was not administered due to nearly nonfocal exam and normal speech by that point.  Pt was admitted as observation for stroke workup.   Assessment/Plan  Stroke-like symptoms --left UE weakness and dysarthria.   - Permissive HTN x48 hrs from sx onset or until stroke ruled out by MRI goal BP <220/120. PRN labetalol or hydralazine if BP above these parameters. Avoid oral antihypertensives. - MRI brain wo contrast - MRA head - Carotid US - TTE w/ bubble - Check A1c  and LDL + add statin per guidelines - plavix 75mg  daily (patient is allergic to aspirin) - q4 hr neuro checks - STAT head CT for any change in neuro exam - Tele - PT/OT - Stroke education  Adrenal insufficiency --cont home Cortef  hereditary spastic paraplegia vs functional spasticity --cont home baclofen and Lyrica  Chronic pain on chronic opioids --cont home oral dilaudid    DVT prophylaxis: Lovenox SQ Code Status: Full code  Family Communication: mother updated at bedside on admission  Disposition Plan: home  Consults called: neuro Level of care: Med-Surg   Review of Systems: As per HPI otherwise complete review of systems negative.   Past Medical History:  Diagnosis Date   Bladder retention    Complication of anesthesia    ? seizures after anesthesia    Depression    Diarrhea due to malabsorption    Dizziness    Headache    Migraines    Neurogenic bladder    Pyelonephritis    Renal disorder    Vision abnormalities    Weight loss     Past Surgical History:  Procedure Laterality Date   ANTERIOR CRUCIATE LIGAMENT REPAIR  1997   APPENDECTOMY     BLADDER REMOVAL     COLONOSCOPY WITH PROPOFOL N/A 11/11/2018   Procedure: COLONOSCOPY WITH PROPOFOL;  Surgeon: Christena Deem, MD;  Location: [facility] ENDOSCOPY;  Service: Endoscopy;  Laterality: N/A;   CYSTOSCOPY WITH STENT PLACEMENT Right 04/17/2016  Procedure: CYSTOSCOPY WITH STENT PLACEMENT;  Surgeon: Malen Gauze, MD;  Location: [facility] ORS;  Service: Urology;  Laterality: Right;   ESOPHAGOGASTRODUODENOSCOPY (EGD) WITH PROPOFOL N/A 11/11/2018   Procedure: ESOPHAGOGASTRODUODENOSCOPY (EGD) WITH PROPOFOL;  Surgeon: Christena Deem, MD;  Location: [facility] ENDOSCOPY;  Service: Endoscopy;  Laterality: N/A;   EXPLORATORY LAPAROTOMY  1999   HIP SURGERY Right    KIDNEY STONE SURGERY  04/2016   REVISION UROSTOMY CUTANEOUS     REVISION UROSTOMY CUTANEOUS  01/10/2018   SUPRAPUBIC CATHETER PLACEMENT  08/2017    TONSILLECTOMY       reports that she has never smoked. She has never used smokeless tobacco. She reports that she does not drink alcohol and does not use drugs.  Allergies  Allergen Reactions   Aspirin Shortness Of Breath, Swelling and Anaphylaxis   Ibuprofen Shortness Of Breath, Swelling and Anaphylaxis   Ketorolac Anaphylaxis   Morphine Hives    Itching as well. This has happened twice. Last witness w/ itchy hived all over pt's neck, check, torso, and abdomen.    Dilaudid ok  Itching as well. This has happened twice. Last witness w/ itchy hived all over pt's neck, check, torso, and abdomen.  Dilaudid ok   Nsaids Anaphylaxis   Vibegron Anaphylaxis   Bupropion Palpitations   Morphine And Codeine Hives   Iron Dextran Hives    Developed LUE urticaria about 15 minutes into iron dextran infusion.   Methenamine Hippurate Rash    Family History  Problem Relation Age of Onset   Hypertension Mother    Atrial fibrillation Father    Healthy Brother    Depression Brother    Arthritis/Rheumatoid Paternal Grandmother    Healthy Brother     Prior to Admission medications   Medication Sig Start Date End Date Taking? Authorizing Provider  baclofen (LIORESAL) 20 MG tablet Take 20 mg by mouth 2 (two) times daily. 12/08/21 11/02/23  [provider]  belladonna-opium (B&O SUPPRETTES) 16.2-30 MG suppository Place 30 mg rectally every 8 (eight) hours as needed for pain.    [provider]  Cholecalciferol (D 1000) 25 MCG (1000 UT) capsule Take 1,000 Units by mouth daily. 01/19/22   [provider]  clotrimazole (LOTRIMIN) 1 % cream Apply 1 Application topically 2 (two) times daily. 12/28/22 12/28/23  [provider]  Cyanocobalamin (VITAMIN B-12) 2500 MCG SUBL Take 2,500 mcg by mouth daily.    [provider]  diazepam (VALIUM) 2 MG tablet Take 2 mg by mouth every 6 (six) hours as needed for anxiety or muscle spasms (Vaginal application for PFM  spasms). Patient not taking: Reported on 02/14/2023    [provider]  DULoxetine (CYMBALTA) 60 MG capsule Take 60 mg by mouth in the morning. 12/03/20   [provider]  EPINEPHrine 0.3 mg/0.3 mL IJ SOAJ injection Inject into the muscle. 08/31/20   [provider]  Fremanezumab-vfrm 225 MG/1.5ML SOAJ Inject 225 mg into the skin See admin instructions.    [provider]  hydrocortisone (CORTEF) 10 MG tablet Take 2 tablets (20 mg total) by mouth daily AND 1 tablet (10 mg total) every evening. 02/22/23   Leeroy Bock, MD  hydrocortisone (CORTEF) 5 MG tablet Take 10-30 mg by mouth 2 (two) times daily as needed (FOR SICK DAYS AS NEEDED). 12/21/22   [provider]  HYDROmorphone (DILAUDID) 2 MG tablet Take 2 mg by mouth every 12 (twelve) hours as needed for moderate pain (pain score 4-6). 12/21/22  [provider]  hydrOXYzine (ATARAX) 25 MG tablet Take 25 mg by mouth 3 (three) times daily as needed for itching. 01/18/23   [provider]  linaclotide Karlene Einstein) 72 MCG capsule Take 72 mcg by mouth daily before breakfast. 01/19/22   [provider]  mirtazapine (REMERON) 15 MG tablet Take 15 mg by mouth at bedtime. 11/05/20   [provider]  naloxone [facility]) nasal spray 4 mg/0.1 mL Place 1 spray into the nose once as needed (Suspected Opiod Overdose). 02/02/22   [provider]  nystatin (MYCOSTATIN) 100000 UNIT/ML suspension Use as directed 4 mLs in the mouth or throat 4 (four) times daily. 12/02/22   [provider]  ondansetron (ZOFRAN) 4 MG tablet Take 4 mg by mouth every 8 (eight) hours as needed for nausea or vomiting.    [provider]  ondansetron (ZOFRAN-ODT) 4 MG disintegrating tablet Take 1 tablet (4 mg total) by mouth every 6 (six) hours as needed for nausea or vomiting. 02/22/23   Leeroy Bock, MD  pantoprazole (PROTONIX) 20 MG tablet Take 1 tablet (20 mg total) by mouth daily.  02/22/23 03/24/23  Leeroy Bock, MD  pregabalin (LYRICA) 100 MG capsule Take 100 mg by mouth 2 (two) times daily. Takes 2 caps po q am and 2 caps po QHS 02/14/19   [provider]  promethazine (PHENERGAN) 12.5 MG suppository Place 1 suppository (12.5 mg total) rectally every 6 (six) hours as needed for nausea or vomiting. 02/22/23   Leeroy Bock, MD  promethazine (PHENERGAN) 25 MG tablet Take 25 mg by mouth every 8 (eight) hours as needed for nausea or vomiting. 01/19/22   [provider]  rizatriptan (MAXALT) 10 MG tablet Take 10 mg by mouth as needed for migraine.    [provider]  scopolamine (TRANSDERM-SCOP) 1 MG/3DAYS Place 1 patch onto the skin every 3 (three) days. 10/16/22   [provider]  SOLU-CORTEF 100 MG injection Inject 100 mg into the muscle once as needed (Adrenal Emergency). 12/21/22   [provider]  tiZANidine (ZANAFLEX) 2 MG tablet Take 2 mg by mouth at bedtime. 12/19/20   [provider]  triamcinolone cream (KENALOG) 0.1 % Apply 1 Application topically 2 (two) times daily. 12/20/22 12/20/23  [provider]  UBRELVY 50 MG TABS Take by mouth. 11/24/20   [provider]    Physical Exam: Vitals:   06/13/23 1320 06/13/23 1321 06/13/23 1330 06/13/23 1333  BP: 126/87 126/87 115/78   Pulse: 87  83   Resp: 13  14   Temp:    98.8 F (37.1 C)  TempSrc:    Oral  SpO2: 100%  99%     Constitutional: NAD, AAOx3 HEENT: conjunctivae and lids normal, EOMI CV: No cyanosis.   RESP: normal respiratory effort, on RA Neuro: II - XII grossly intact.   Psych: Normal mood and affect.  Appropriate judgement and reason  Labs on Admission: I have personally reviewed labs and imaging studies  Time spent: 60 minutes  Darlin Priestly MD [facility]  If 7PM-7AM, please contact night-coverage 06/13/2023, 2:27 PM

## 2023-06-14 ENCOUNTER — Observation Stay: Payer: BC Managed Care – PPO

## 2023-06-14 ENCOUNTER — Ambulatory Visit: Payer: BC Managed Care – PPO

## 2023-06-14 ENCOUNTER — Inpatient Hospital Stay
Admit: 2023-06-14 | Discharge: 2023-06-14 | Disposition: A | Discharge status: Home / Self Care | Payer: BC Managed Care – PPO | Attending: Cardiology | Admitting: Cardiology

## 2023-06-14 ENCOUNTER — Observation Stay (HOSPITAL_BASED_OUTPATIENT_CLINIC_OR_DEPARTMENT_OTHER)
Admit: 2023-06-14 | Discharge: 2023-06-14 | Disposition: A | Discharge status: Home / Self Care | Payer: BC Managed Care – PPO | Attending: Neurology | Admitting: Neurology

## 2023-06-14 DIAGNOSIS — I6389 Other cerebral infarction: Secondary | ICD-10-CM

## 2023-06-14 DIAGNOSIS — R55 Syncope and collapse: Secondary | ICD-10-CM

## 2023-06-14 DIAGNOSIS — R2990 Unspecified symptoms and signs involving the nervous system: Secondary | ICD-10-CM | POA: Diagnosis not present

## 2023-06-14 LAB — ANTITHROMBIN III: AntiThromb III Func: 86 % (ref 75–120)

## 2023-06-14 LAB — ECHOCARDIOGRAM COMPLETE BUBBLE STUDY
AR max vel: 3.16 cm2
AV Area VTI: 3.87 cm2
AV Area mean vel: 3.24 cm2
AV Mean grad: 2 mm[Hg]
AV Peak grad: 3.5 mm[Hg]
Ao pk vel: 0.93 m/s
Area-P 1/2: 4.26 cm2
MV VTI: 3.15 cm2
S' Lateral: 2.7 cm

## 2023-06-14 MED ORDER — UBROGEPANT 50 MG PO TABS: 50.0000 mg | ORAL_TABLET | Freq: Every day | ORAL | Status: DC | PRN

## 2023-06-14 MED ORDER — PANTOPRAZOLE SODIUM 40 MG PO TBEC
40.0000 mg | DELAYED_RELEASE_TABLET | Freq: Two times a day (BID) | ORAL | Status: DC
  Administered 2023-06-14: 40 mg via ORAL
  Filled 2023-06-14: qty 1

## 2023-06-14 MED ORDER — HYDROMORPHONE HCL 2 MG PO TABS
2.0000 mg | ORAL_TABLET | ORAL | Status: DC | PRN
  Administered 2023-06-14 (×2): 2 mg via ORAL
  Filled 2023-06-14 (×2): qty 1

## 2023-06-14 MED ORDER — TAMSULOSIN HCL 0.4 MG PO CAPS
0.4000 mg | ORAL_CAPSULE | Freq: Two times a day (BID) | ORAL | Status: DC
  Administered 2023-06-14: 0.4 mg via ORAL
  Filled 2023-06-14: qty 1

## 2023-06-14 MED ORDER — ONDANSETRON 4 MG PO TBDP
8.0000 mg | ORAL_TABLET | Freq: Four times a day (QID) | ORAL | Status: DC | PRN
  Administered 2023-06-14: 8 mg via ORAL
  Filled 2023-06-14: qty 2

## 2023-06-14 MED ORDER — ATORVASTATIN CALCIUM 40 MG PO TABS: 40.0000 mg | ORAL_TABLET | Freq: Every day | ORAL | 2 refills | Status: AC

## 2023-06-14 MED ORDER — SUMATRIPTAN SUCCINATE 50 MG PO TABS
100.0000 mg | ORAL_TABLET | ORAL | Status: DC | PRN
  Filled 2023-06-14: qty 2

## 2023-06-14 MED ORDER — LETROZOLE 2.5 MG PO TABS
2.5000 mg | ORAL_TABLET | Freq: Every day | ORAL | Status: DC
Start: 2023-06-14 — End: 2023-06-14
  Filled 2023-06-14: qty 1

## 2023-06-14 MED ORDER — CLOPIDOGREL BISULFATE 75 MG PO TABS: 75.0000 mg | ORAL_TABLET | Freq: Every day | ORAL | 2 refills | Status: AC

## 2023-06-14 MED ORDER — TIZANIDINE HCL 2 MG PO TABS
2.0000 mg | ORAL_TABLET | Freq: Three times a day (TID) | ORAL | Status: DC | PRN
  Administered 2023-06-14: 2 mg via ORAL
  Filled 2023-06-14 (×2): qty 2

## 2023-06-14 MED ORDER — ATORVASTATIN CALCIUM 20 MG PO TABS
40.0000 mg | ORAL_TABLET | Freq: Every day | ORAL | Status: DC
  Administered 2023-06-14: 40 mg via ORAL
  Filled 2023-06-14: qty 2

## 2023-06-14 MED ORDER — ACETAMINOPHEN 500 MG PO TABS: 1000.0000 mg | ORAL_TABLET | Freq: Four times a day (QID) | ORAL | Status: DC | PRN

## 2023-06-14 MED ORDER — PROMETHAZINE HCL 25 MG PO TABS
25.0000 mg | ORAL_TABLET | Freq: Three times a day (TID) | ORAL | Status: DC | PRN
  Administered 2023-06-14: 25 mg via ORAL
  Filled 2023-06-14 (×2): qty 1

## 2023-06-14 NOTE — Discharge Summary (Signed)
 Physician Discharge Summary   Christine Chavez  female DOB: 1981/01/14  UJW:[id number]  PCP: Ardyth Man, PA-C  Admit date: 06/13/2023 Discharge date: 06/14/2023  Admitted From: home Disposition:  home CODE STATUS: Full code  Discharge Instructions     Ambulatory referral to Neurology   Complete by: As directed    Diet - low sodium heart healthy   Complete by: As directed       Hospital Course:  For full details, please see H&P, progress notes, consult notes and ancillary notes.  Briefly,  Christine Chavez is a 43 y.o. 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    Acute infarcts --MRI brain pos for "Two small acute cortical infarcts within the posterior right frontal lobe (with involvement of the motor strip)" and "Adjacent small acute infarct within the posterior right frontal lobe white matter." --Echo unremarkable and showed no atrial level shunt.   --due to stroke in young age, hypercoagulable workup ordered by neuro. --avoid rizatriptan with stroke. --pt was discharged on Plavix (pt allergic to ASA), lipitor, and cardiac monitor.  Outpatient neuro f/u.  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  Discharge Diagnoses:  Principal Problem:   Stroke-like symptoms     Discharge Instructions:  Allergies as of 06/14/2023       Reactions   Aspirin Shortness Of Breath, Swelling, Anaphylaxis   Ibuprofen Shortness Of Breath, Swelling, Anaphylaxis   Ketorolac  Anaphylaxis   Levofloxacin Other (See Comments)   Other Reaction(s): Arthralgias, Myalgias Severe achilles tendon pain   Morphine Hives   Itching as well. This has happened twice. Last witness w/ itchy hived all over pt's neck, check, torso, and abdomen.    Dilaudid ok Itching as well. This has happened twice. Last witness w/ itchy hived all over pt's neck, check, torso, and abdomen.  Dilaudid ok   Nsaids Anaphylaxis   Vibegron Anaphylaxis   Bupropion Palpitations   Morphine And Codeine Hives   Vancomycin Itching   Other Reaction(s): Dizziness Pt tolerates with Benadryl pre-med   Iron Dextran Hives   Developed LUE urticaria about 15 minutes into iron dextran infusion.   Methenamine Hippurate Rash   Prochlorperazine Anxiety, Other (See Comments)   "Out of body experience"        Medication List     STOP taking these medications    diazepam 5 MG tablet Commonly known as: VALIUM   fesoterodine 4 MG Tb24 tablet Commonly known as: TOVIAZ   letrozole 2.5 MG tablet Commonly known as: FEMARA   rizatriptan 10 MG tablet Commonly known as: MAXALT       TAKE these medications    atorvastatin 40 MG tablet Commonly known as: LIPITOR Take 1 tablet (40 mg total) by mouth daily.   baclofen 20 MG tablet Commonly known as: LIORESAL Take 20 mg by mouth 2 (two) times daily.   buPROPion 150 MG 24 hr tablet Commonly known as: WELLBUTRIN XL Take 150 mg by mouth daily.   clopidogrel 75 MG tablet Commonly known as: PLAVIX Take 1 tablet (75 mg total) by mouth  daily. Start taking on: June 15, 2023   D 1000 25 MCG (1000 UT) capsule Generic drug: Cholecalciferol Take 1,000 Units by mouth daily.   EPINEPHrine 0.3 mg/0.3 mL Soaj injection Commonly known as: EPI-PEN Inject 0.3 mg into the muscle as needed for anaphylaxis.   Fremanezumab-vfrm 225 MG/1.5ML Soaj Inject 225 mg into the skin See admin instructions.   hydrocortisone 10 MG tablet Commonly known as:  CORTEF Take 10 mg by mouth every morning.   hydrocortisone 5 MG tablet Commonly known as: CORTEF Take 5 mg by mouth every evening. Per pt, taken b/t 3 and 5 pm consistently Double dose per sick day dose   HYDROmorphone 2 MG tablet Commonly known as: DILAUDID Take 2 mg by mouth every 12 (twelve) hours as needed for moderate pain (pain score 4-6).   hydrOXYzine 25 MG tablet Commonly known as: ATARAX Take 25 mg by mouth 3 (three) times daily as needed for itching.   Linzess 72 MCG capsule Generic drug: linaclotide Take 72 mcg by mouth daily as needed.   METHENAMINE HIPPURATE PO Take 1 g by mouth 2 (two) times daily.   mirtazapine 15 MG tablet Commonly known as: REMERON Take 15 mg by mouth at bedtime.   naloxone 4 MG/0.1ML Liqd nasal spray kit Commonly known as: NARCAN Place 1 spray into the nose once as needed (Suspected Opiod Overdose).   norethindrone 5 MG tablet Commonly known as: AYGESTIN Take 5 mg by mouth daily.   ondansetron 8 MG disintegrating tablet Commonly known as: ZOFRAN-ODT Take 8 mg by mouth every 6 (six) hours as needed for nausea or vomiting.   pantoprazole 40 MG tablet Commonly known as: PROTONIX Take 40 mg by mouth 2 (two) times daily.   pregabalin 100 MG capsule Commonly known as: LYRICA Take 100 mg by mouth 2 (two) times daily.   promethazine 25 MG tablet Commonly known as: PHENERGAN Take 25 mg by mouth every 8 (eight) hours as needed for nausea or vomiting.   promethazine 12.5 MG suppository Commonly known as: PHENERGAN Place 1 suppository (12.5 mg total) rectally every 6 (six) hours as needed for nausea or vomiting.   Solu-CORTEF 100 MG injection Generic drug: hydrocortisone sodium succinate Inject 100 mg into the muscle once as needed (Adrenal Emergency).   tamsulosin 0.4 MG Caps capsule Commonly known as: FLOMAX Take 0.4 mg by mouth 2 (two) times daily as needed.   tiZANidine 2 MG tablet Commonly known as: ZANAFLEX Take 2 mg by  mouth as directed. Per pt, taken nightly, then as needed for breakthrough   Ubrelvy 50 MG Tabs Generic drug: Ubrogepant Take 50 mg by mouth daily as needed.   Vitamin B-12 2500 MCG Subl Take 2,500 mcg by mouth daily.         Follow-up Information     Ardyth Man, PA-C Follow up in 1 week(s).   Specialty: Family Medicine Contact information: [address] [place] [address] [phone]         Morene Crocker, MD Follow up in 1 month(s).   Specialty: Neurology Contact information: [phone] [facility] [address] [facility] [place] [address] [phone]         Antonieta Iba, MD Follow up.   Specialty: Cardiology Why: for cardiac monitor Contact information: [address] [place] [address] [phone]                 Allergies  Allergen Reactions   Aspirin Shortness Of Breath, Swelling  and Anaphylaxis   Ibuprofen Shortness Of Breath, Swelling and Anaphylaxis   Ketorolac Anaphylaxis   Levofloxacin Other (See Comments)    Other Reaction(s): Arthralgias, Myalgias  Severe achilles tendon pain   Morphine Hives    Itching as well. This has happened twice. Last witness w/ itchy hived all over pt's neck, check, torso, and abdomen.    Dilaudid ok  Itching as well. This has happened twice. Last witness w/ itchy hived all over pt's neck, check, torso, and abdomen.  Dilaudid ok   Nsaids Anaphylaxis   Vibegron Anaphylaxis   Bupropion Palpitations   Morphine And Codeine Hives   Vancomycin Itching    Other Reaction(s): Dizziness  Pt tolerates with Benadryl pre-med   Iron Dextran Hives    Developed LUE urticaria about 15 minutes into iron dextran infusion.   Methenamine Hippurate Rash   Prochlorperazine Anxiety and Other (See Comments)    "Out of body experience"     The results of significant diagnostics from this hospitalization (including imaging, microbiology, ancillary and laboratory) are listed  below for reference.   Consultations:   Procedures/Studies: US Venous Img Lower Bilateral (DVT) Result Date: 06/14/2023 CLINICAL DATA:  Leg pain EXAM: Bilateral LOWER EXTREMITY VENOUS DOPPLER ULTRASOUND TECHNIQUE: Gray-scale sonography with compression, as well as color and duplex ultrasound, were performed to evaluate the deep venous system(s) from the level of the common femoral vein through the popliteal and proximal calf veins. COMPARISON:  None Available. FINDINGS: VENOUS Normal compressibility of the common femoral, superficial femoral, and popliteal veins, as well as the visualized calf veins. Visualized portions of profunda femoral vein and great saphenous vein unremarkable. No filling defects to suggest DVT on grayscale or color Doppler imaging. Doppler waveforms show normal direction of venous flow, normal respiratory plasticity and response to augmentation. Some limited evaluation of the right peroneal vein. OTHER None. Limitations: none IMPRESSION: No evidence of bilateral lower extremity DVT. Electronically Signed   By: Karen Kays M.D.   On: 06/14/2023 14:25   ECHOCARDIOGRAM COMPLETE BUBBLE STUDY Result Date: 06/14/2023    ECHOCARDIOGRAM REPORT   Patient Name:   [facility] Date of Exam: 06/14/2023 Medical Rec #:  [id number]          Height:       67.0 in Accession #:    [id number]         Weight:       141.1 lb Date of Birth:  05-Feb-1981          BSA:          1.744 m Patient Age:    42 years           BP:           104/68 mmHg Patient Gender: F                  HR:           72 bpm. Exam Location:  [facility] Procedure: 2D Echo, Cardiac Doppler, Color Doppler and Saline Contrast Bubble            Study (Both Spectral and Color Flow Doppler were utilized during            procedure). Indications:     Stroke 434.91 / I63.9  History:         Patient has prior history of Echocardiogram examinations, most                  recent 05/13/2015.  Sonographer:     Cristela Blue Referring Phys:  [id number]  Malachi Carl STACK Diagnosing Phys: Julien Nordmann MD IMPRESSIONS  1. Left ventricular ejection fraction, by estimation, is 60 to 65%. The left ventricle has normal function. The left ventricle has no regional wall motion abnormalities. Left ventricular diastolic parameters were normal.  2. Right ventricular systolic function is normal. The right ventricular size is normal. There is normal pulmonary artery systolic pressure. The estimated right ventricular systolic pressure is 18.1 mmHg.  3. The mitral valve is normal in structure. No evidence of mitral valve regurgitation. No evidence of mitral stenosis.  4. The aortic valve is normal in structure. Aortic valve regurgitation is not visualized. No aortic stenosis is present.  5. The inferior vena cava is normal in size with greater than 50% respiratory variability, suggesting right atrial pressure of 3 mmHg.  6. Agitated saline contrast bubble study was negative, with no evidence of any interatrial shunt. FINDINGS  Left Ventricle: Left ventricular ejection fraction, by estimation, is 60 to 65%. The left ventricle has normal function. The left ventricle has no regional wall motion abnormalities. Strain imaging was not performed. The left ventricular internal cavity  size was normal in size. There is no left ventricular hypertrophy. Left ventricular diastolic parameters were normal. Right Ventricle: The right ventricular size is normal. No increase in right ventricular wall thickness. Right ventricular systolic function is normal. There is normal pulmonary artery systolic pressure. The tricuspid regurgitant velocity is 1.81 m/s, and  with an assumed right atrial pressure of 5 mmHg, the estimated right ventricular systolic pressure is 18.1 mmHg. Left Atrium: Left atrial size was normal in size. Right Atrium: Right atrial size was normal in size. Pericardium: There is no evidence of pericardial effusion. Mitral Valve: The mitral valve is normal in structure. No evidence of  mitral valve regurgitation. No evidence of mitral valve stenosis. MV peak gradient, 3.2 mmHg. The mean mitral valve gradient is 2.0 mmHg. Tricuspid Valve: The tricuspid valve is normal in structure. Tricuspid valve regurgitation is not demonstrated. No evidence of tricuspid stenosis. Aortic Valve: The aortic valve is normal in structure. Aortic valve regurgitation is not visualized. No aortic stenosis is present. Aortic valve mean gradient measures 2.0 mmHg. Aortic valve peak gradient measures 3.5 mmHg. Aortic valve area, by VTI measures 3.87 cm. Pulmonic Valve: The pulmonic valve was normal in structure. Pulmonic valve regurgitation is not visualized. No evidence of pulmonic stenosis. Aorta: The aortic root is normal in size and structure. Venous: The inferior vena cava is normal in size with greater than 50% respiratory variability, suggesting right atrial pressure of 3 mmHg. IAS/Shunts: No atrial level shunt detected by color flow Doppler. Agitated saline contrast was given intravenously to evaluate for intracardiac shunting. Agitated saline contrast bubble study was negative, with no evidence of any interatrial shunt. There  is no evidence of a patent foramen ovale. There is no evidence of an atrial septal defect. Additional Comments: 3D imaging was not performed.  LEFT VENTRICLE PLAX 2D LVIDd:         4.60 cm   Diastology LVIDs:         2.70 cm   LV e' medial:    11.20 cm/s LV PW:         0.90 cm   LV E/e' medial:  7.6 LV IVS:        0.90 cm   LV e' lateral:   15.30 cm/s LVOT diam:     2.00 cm  LV E/e' lateral: 5.6 LV SV:         61 LV SV Index:   35 LVOT Area:     3.14 cm  RIGHT VENTRICLE RV Basal diam:  2.40 cm RV Mid diam:    2.00 cm RV S prime:     14.00 cm/s TAPSE (M-mode): 2.9 cm LEFT ATRIUM             Index        RIGHT ATRIUM           Index LA diam:        2.40 cm 1.38 cm/m   RA Area:     12.70 cm LA Vol (A2C):   23.0 ml 13.19 ml/m  RA Volume:   27.10 ml  15.54 ml/m LA Vol (A4C):   15.7 ml 9.00  ml/m LA Biplane Vol: 19.8 ml 11.36 ml/m  AORTIC VALVE AV Area (Vmax):    3.16 cm AV Area (Vmean):   3.24 cm AV Area (VTI):     3.87 cm AV Vmax:           93.30 cm/s AV Vmean:          61.600 cm/s AV VTI:            0.157 m AV Peak Grad:      3.5 mmHg AV Mean Grad:      2.0 mmHg LVOT Vmax:         93.80 cm/s LVOT Vmean:        63.550 cm/s LVOT VTI:          0.194 m LVOT/AV VTI ratio: 1.23  AORTA Ao Root diam: 3.30 cm MITRAL VALVE               TRICUSPID VALVE MV Area (PHT): 4.26 cm    TR Peak grad:   13.1 mmHg MV Area VTI:   3.15 cm    TR Vmax:        181.00 cm/s MV Peak grad:  3.2 mmHg MV Mean grad:  2.0 mmHg    SHUNTS MV Vmax:       0.90 m/s    Systemic VTI:  0.19 m MV Vmean:      61.1 cm/s   Systemic Diam: 2.00 cm MV Decel Time: 178 msec MV E velocity: 85.40 cm/s MV A velocity: 63.90 cm/s MV E/A ratio:  1.34 Julien Nordmann MD Electronically signed by Julien Nordmann MD Signature Date/Time: 06/14/2023/10:09:46 AM    Final    MR BRAIN WO CONTRAST Result Date: 06/13/2023 CLINICAL DATA:  Provided history: Neuro deficit, acute, stroke suspected. Additional history provided: Acute onset upper extremity weakness and difficulty speaking. 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 EXAM: MRI HEAD WITHOUT CONTRAST MRA HEAD WITHOUT CONTRAST TECHNIQUE: Multiplanar, multi-echo pulse sequences of the brain and surrounding structures were acquired without intravenous contrast. Angiographic images of the Circle of Willis were acquired using MRA technique without intravenous contrast. COMPARISON:  Non-contrast head CT 06/13/2023. FINDINGS: MRI HEAD FINDINGS Brain: No age-advanced or lobar predominant cerebral atrophy. Two small acute cortical infarcts within the posterior right frontal lobe (with involvement of the precentral gyrus). Adjacent small acute infarct within the posterior right frontal lobe white matter. Moderate multifocal T2 FLAIR hyperintense signal abnormality  elsewhere within the bilateral cerebral white matter (with a subcortical white matter predominance), greater than expected for age. No evidence of an intracranial mass. No chronic intracranial blood products. No extra-axial fluid collection. No midline  shift. Vascular: Maintained flow voids within the proximal large arterial vessels. Skull and upper cervical spine: No focal worrisome marrow lesion. Sinuses/Orbits: No mass or acute finding within the imaged orbits. No significant paranasal sinus disease. MRA HEAD FINDINGS Significantly motion degraded examination. Within this limitation, findings are as follows. Anterior circulation: The intracranial internal carotid arteries are patent. The M1 middle cerebral arteries are patent. No M2 proximal branch occlusion is identified. The anterior cerebral arteries are patent. Azygous configuration of the anterior cerebral arteries. Posterior circulation: The intracranial vertebral arteries are patent. The basilar artery is patent. The posterior cerebral arteries are patent. A right posterior communicating artery is present. The left posterior communicating artery is diminutive or absent. Anatomic variants: As described. MRI brain impressions #1 and #2 will be called to the ordering clinician or representative by the Radiologist Assistant, and communication documented in the PACS or [employer]. IMPRESSION: MRI brain: 1. 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 2. Adjacent small acute infarct within the posterior right frontal lobe white matter. 3. Background moderate multifocal T2 FLAIR hyperintense signal abnormality within the cerebral white matter (with a subcortical white matter predominance), greater than expected for age. These signal changes are nonspecific and differential considerations include accelerated chronic small vessel ischemic disease, sequelae of chronic migraine headaches,  sequelae of vasculitis/hypercoagulable state and sequelae of a prior infectious/inflammatory process, among others. MRA head: Significantly motion degraded examination. No proximal intracranial large vessel occlusion is identified. Motion artifact precludes accurate assessment for stenoses and for vessel irregularity. Electronically Signed   By: Jackey Loge D.O.   On: 06/13/2023 20:24   MR ANGIO HEAD WO CONTRAST Result Date: 06/13/2023 CLINICAL DATA:  Provided history: Neuro deficit, acute, stroke suspected. Additional history provided: Acute onset upper extremity weakness and difficulty speaking. 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 EXAM: MRI HEAD WITHOUT CONTRAST MRA HEAD WITHOUT CONTRAST TECHNIQUE: Multiplanar, multi-echo pulse sequences of the brain and surrounding structures were acquired without intravenous contrast. Angiographic images of the Circle of Willis were acquired using MRA technique without intravenous contrast. COMPARISON:  Non-contrast head CT 06/13/2023. FINDINGS: MRI HEAD FINDINGS Brain: No age-advanced or lobar predominant cerebral atrophy. Two small acute cortical infarcts within the posterior right frontal lobe (with involvement of the precentral gyrus). Adjacent small acute infarct within the posterior right frontal lobe white matter. Moderate multifocal T2 FLAIR hyperintense signal abnormality elsewhere within the bilateral cerebral white matter (with a subcortical white matter predominance), greater than expected for age. No evidence of an intracranial mass. No chronic intracranial blood products. No extra-axial fluid collection. No midline shift. Vascular: Maintained flow voids within the proximal large arterial vessels. Skull and upper cervical spine: No focal worrisome marrow lesion. Sinuses/Orbits: No mass or acute finding within the imaged orbits. No significant paranasal sinus disease. MRA HEAD FINDINGS Significantly motion degraded  examination. Within this limitation, findings are as follows. Anterior circulation: The intracranial internal carotid arteries are patent. The M1 middle cerebral arteries are patent. No M2 proximal branch occlusion is identified. The anterior cerebral arteries are patent. Azygous configuration of the anterior cerebral arteries. Posterior circulation: The intracranial vertebral arteries are patent. The basilar artery is patent. The posterior cerebral arteries are patent. A right posterior communicating artery is present. The left posterior communicating artery is diminutive or absent. Anatomic variants: As described. MRI brain impressions #1 and #2 will be called to the ordering clinician or representative by the Radiologist Assistant,  and communication documented in the PACS or [employer]. IMPRESSION: MRI brain: 1. 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 2. Adjacent small acute infarct within the posterior right frontal lobe white matter. 3. Background moderate multifocal T2 FLAIR hyperintense signal abnormality within the cerebral white matter (with a subcortical white matter predominance), greater than expected for age. These signal changes are nonspecific and differential considerations include accelerated chronic small vessel ischemic disease, sequelae of chronic migraine headaches, sequelae of vasculitis/hypercoagulable state and sequelae of a prior infectious/inflammatory process, among others. MRA head: Significantly motion degraded examination. No proximal intracranial large vessel occlusion is identified. Motion artifact precludes accurate assessment for stenoses and for vessel irregularity. Electronically Signed   By: Jackey Loge D.O.   On: 06/13/2023 20:24   US Carotid Bilateral Result Date: 06/13/2023 CLINICAL DATA:  Stroke EXAM: BILATERAL CAROTID DUPLEX ULTRASOUND TECHNIQUE: Wallace Cullens scale imaging, color Doppler and duplex  ultrasound were performed of bilateral carotid and vertebral arteries in the neck. COMPARISON:  None Available. FINDINGS: Criteria: Quantification of carotid stenosis is based on velocity parameters that correlate the residual internal carotid diameter with NASCET-based stenosis levels, using the diameter of the distal internal carotid lumen as the denominator for stenosis measurement. The following velocity measurements were obtained: RIGHT ICA: 76/28 cm/sec CCA: 63/18 cm/sec SYSTOLIC ICA/CCA RATIO:  1.2 ECA:  67 cm/sec LEFT ICA: 61/28 cm/sec CCA: 73/21 cm/sec SYSTOLIC ICA/CCA RATIO:  0.8 ECA:  80 cm/sec RIGHT CAROTID ARTERY: No atherosclerotic plaque or evidence of stenosis. RIGHT VERTEBRAL ARTERY:  Patent with normal antegrade flow. LEFT CAROTID ARTERY: No atherosclerotic plaque or evidence of stenosis. LEFT VERTEBRAL ARTERY:  Patent with normal antegrade flow. IMPRESSION: Normal bilateral carotid duplex ultrasound. Electronically Signed   By: Malachy Moan M.D.   On: 06/13/2023 16:14   CT HEAD CODE STROKE WO CONTRAST Result Date: 06/13/2023 CLINICAL DATA:  Code stroke. 43 year old female with weakness and aphasia. Last known well 0930 hours. EXAM: CT HEAD WITHOUT CONTRAST TECHNIQUE: Contiguous axial images were obtained from the base of the skull through the vertex without intravenous contrast. RADIATION DOSE REDUCTION: This exam was performed according to the departmental dose-optimization program which includes automated exposure control, adjustment of the mA and/or kV according to patient size and/or use of iterative reconstruction technique. COMPARISON:  None Available. FINDINGS: Brain: Normal cerebral volume. Mild scattered subcortical white matter hypodensity, otherwise maintained gray-white differentiation throughout. No midline shift, ventriculomegaly, mass effect, evidence of mass lesion, intracranial hemorrhage or evidence of cortically based acute infarction. Vascular: No suspicious  intracranial vascular hyperdensity. Skull: Intact, negative. Sinuses/Orbits: Visualized paranasal sinuses and mastoids are clear. Other: No gaze deviation. Visualized orbits and scalp soft tissues are within normal limits. ASPECTS [facility] Stroke Program Early CT Score) Total score (0-10 with 10 being normal): 10 IMPRESSION: Mild for age nonspecific cerebral white matter changes. ASPECTS 10. No acute intracranial hemorrhage or cortically based infarct. These results were communicated to Dr. Selina Cooley at 1:05 pm on 06/13/2023 by text page via the [facility] messaging system. Electronically Signed   By: Odessa Fleming M.D.   On: 06/13/2023 13:05   CT Angio Chest Pulmonary Embolism (PE) W or WO Contrast Result Date: 06/08/2023 CLINICAL DATA:  Chest pain. Recent prolonged hospitalization. High probability for pulmonary embolism. EXAM: CT ANGIOGRAPHY CHEST WITH CONTRAST TECHNIQUE: Multidetector CT imaging of the chest was performed using the standard protocol during bolus administration of intravenous contrast. Multiplanar CT image reconstructions and MIPs were obtained to  evaluate the vascular anatomy. RADIATION DOSE REDUCTION: This exam was performed according to the departmental dose-optimization program which includes automated exposure control, adjustment of the mA and/or kV according to patient size and/or use of iterative reconstruction technique. CONTRAST:  75mL OMNIPAQUE IOHEXOL 350 MG/ML SOLN COMPARISON:  None Available. FINDINGS: Cardiovascular: Satisfactory opacification of pulmonary arteries noted, and no pulmonary emboli identified. No evidence of thoracic aortic dissection or aneurysm. Mediastinum/Nodes: No masses or pathologically enlarged lymph nodes identified. Lungs/Pleura: No pulmonary mass, infiltrate, or effusion. Upper abdomen: No acute findings. Musculoskeletal: No suspicious bone lesions identified. Review of the MIP images confirms the above findings. IMPRESSION: Negative. No evidence of pulmonary embolism or  other active disease. Electronically Signed   By: Danae Orleans M.D.   On: 06/08/2023 16:47      Labs: BNP (last 3 results) No results for input(s): "BNP" in the last 8760 hours. Basic Metabolic Panel: Recent Labs  Lab 06/13/23 1245  NA 137  K 4.0  CL 109  CO2 15*  GLUCOSE 94  BUN 9  CREATININE 0.86  CALCIUM 8.9   Liver Function Tests: Recent Labs  Lab 06/13/23 1245  AST 16  ALT 13  ALKPHOS 54  BILITOT 1.0  PROT 7.8  ALBUMIN 3.9   No results for input(s): "LIPASE", "AMYLASE" in the last 168 hours. No results for input(s): "AMMONIA" in the last 168 hours. CBC: Recent Labs  Lab 06/13/23 1245  WBC 9.9  NEUTROABS 6.9  HGB 13.8  HCT 42.0  MCV 93.5  PLT 296   Cardiac Enzymes: No results for input(s): "CKTOTAL", "CKMB", "CKMBINDEX", "TROPONINI" in the last 168 hours. BNP: Invalid input(s): "POCBNP" CBG: Recent Labs  Lab 06/13/23 1252  GLUCAP 101*   D-Dimer No results for input(s): "DDIMER" in the last 72 hours. Hgb A1c No results for input(s): "HGBA1C" in the last 72 hours. Lipid Profile No results for input(s): "CHOL", "HDL", "LDLCALC", "TRIG", "CHOLHDL", "LDLDIRECT" in the last 72 hours. Thyroid function studies No results for input(s): "TSH", "T4TOTAL", "T3FREE", "THYROIDAB" in the last 72 hours.  Invalid input(s): "FREET3" Anemia work up No results for input(s): "VITAMINB12", "FOLATE", "FERRITIN", "TIBC", "IRON", "RETICCTPCT" in the last 72 hours. Urinalysis    Component Value Date/Time   COLORURINE YELLOW (A) 02/13/2023 1746   APPEARANCEUR HAZY (A) 02/13/2023 1746   APPEARANCEUR Clear 03/05/2017 1031   LABSPEC >1.046 (H) 02/13/2023 1746   PHURINE 6.0 02/13/2023 1746   GLUCOSEU NEGATIVE 02/13/2023 1746   HGBUR MODERATE (A) 02/13/2023 1746   BILIRUBINUR NEGATIVE 02/13/2023 1746   BILIRUBINUR Negative 03/05/2017 1031   KETONESUR 20 (A) 02/13/2023 1746   PROTEINUR NEGATIVE 02/13/2023 1746   NITRITE POSITIVE (A) 02/13/2023 1746   LEUKOCYTESUR  TRACE (A) 02/13/2023 1746   Sepsis Labs Recent Labs  Lab 06/13/23 1245  WBC 9.9   Microbiology No results found for this or any previous visit (from the past 240 hours).   Total time spend on discharging this patient, including the last patient exam, discussing the hospital stay, instructions for ongoing care as it relates to all pertinent caregivers, as well as preparing the medical discharge records, prescriptions, and/or referrals as applicable, is 45 minutes.    Darlin Priestly, MD  [facility] 06/14/2023, 2:31 PM

## 2023-06-14 NOTE — Progress Notes (Addendum)
     [facility] REFERRAL        Occupational Therapy * Physical Therapy * Speech Therapy                           DATE 05/27/1980  PATIENT NAME Christine Chavez  PATIENT MRN [id number]       DIAGNOSIS/DIAGNOSIS CODE  R29.90  DATE OF DISCHARGE:  06/14/23       PRIMARY CARE PHYSICIAN      PCP PHONE/FAX      Dear Provider (Name: [facility] outpatient __  Fax: [phone]   I certify that I have examined this patient and that occupational/physical/speech therapy is necessary on an outpatient basis.    The patient has expressed interest in completing their recommended course of therapy at your  location.  Once a formal order from the patient's primary care physician has been obtained, please  contact him/her to schedule an appointment for evaluation at your earliest convenience.   [ x]  Physical Therapy Evaluate and Treat  [ x]  Occupational Therapy Evaluate and Treat  [  ]  Speech Therapy Evaluate and Treat         The patient's primary care physician (listed above) must furnish and be responsible for a formal order such that the recommended services may be furnished while under the primary physician's care, and that the plan of care will be established and reviewed every 30 days (or more often if condition necessitates).

## 2023-06-14 NOTE — Plan of Care (Signed)
 Pt d/c home with family. A&OX4 BP 96/70 (BP Location: Right Arm)   Pulse 75   Temp 98.2 F (36.8 C)   Resp 16   Ht 5\' 7"  (1.702 m)   Wt 64 kg   LMP 02/18/2021   SpO2 96%   BMI 22.10 kg/m  AVS reviewed, follow up questions answered. IV removed, tele removed. No further needs voiced at this time. Personal belongings returned. Pt escorted off flloor by staff.  Reva Bores 06/14/23 4:47 PM

## 2023-06-14 NOTE — Progress Notes (Signed)
 PT Cancellation Note  Patient Details Name: Jesalyn Finazzo MRN: [id number] DOB: 1981-01-28   Cancelled Treatment:    Reason Eval/Treat Not Completed:  (Consult received and chart reviewed.  Initial attempt, unit leadership in room; second attempt, patient leaving unit for imaging (r/o DVT). Will continue efforts at later time/date as patient available and medically appropriate.)   Tarvaris Puglia H. Manson Passey, PT, DPT, NCS 06/14/23, 11:17 AM [phone]

## 2023-06-14 NOTE — Progress Notes (Signed)
 Referral faxed to [facility] [phone]

## 2023-06-14 NOTE — Evaluation (Signed)
 Occupational Therapy Evaluation Patient Details Name: Christine Chavez MRN: [id number] DOB: [birth year] Today's Date: 06/14/2023   History of Present Illness   t is a 43 year old female presented to the ED for acute onset of left greater than right upper and lower extremity weakness and difficulty speaking.  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 PTA pt has required some assist fro ADL/IADL from ongoing illnesses/deconditioning, motivated to be as independent as possible. Reports she is discharging home today. She has primarily been using transport mwc around her house, transferring to that or furniture walking. She has access to a rollator she has used. Pt performs ADLs with PRN assist. Bed mobility completed with supervision, short amb transfer with RW with CGA. LUE FMC/dexterity deficits noted with hand out provided. Discussed discharge recommendation, DME use with pt. Pt will benefit from post acute OT to address deficits. OT will follow acutely.      If plan is discharge home, recommend the following:   A little help with walking and/or transfers;A little help with bathing/dressing/bathroom     Functional Status Assessment   Patient has had a recent decline in their functional status and demonstrates the ability to make significant improvements in function in a reasonable and predictable amount of time.     Equipment Recommendations   None recommended by OT;Other (comment) (pt has recommended  equipment)     Recommendations for Other Services         Precautions/Restrictions   Precautions Precautions: Fall Recall of Precautions/Restrictions: Intact     Mobility Bed Mobility Overal bed mobility: Needs Assistance Bed Mobility: Supine to Sit, Sit to Supine     Supine to sit: Supervision, HOB elevated, Used rails Sit to supine: Supervision, HOB elevated, Used rails        Transfers Overall transfer level: Needs assistance Equipment used: Rolling walker (2 wheels) Transfers: Sit to/from Stand, Bed to chair/wheelchair/BSC Sit to Stand: Contact guard assist     Step pivot transfers: Contact guard assist (with RW)            Balance Overall balance assessment: Needs assistance Sitting-balance support: Feet supported Sitting balance-Leahy Scale: Good     Standing balance support: Bilateral upper extremity supported, During functional activity, Reliant on assistive device for balance Standing balance-Leahy Scale: Fair                             ADL either performed or assessed with clinical judgement   ADL Overall ADL's : Needs assistance/impaired Eating/Feeding: Set up;Sitting   Grooming: Set up                   Toilet Transfer: Contact guard assist;Rolling walker (2 wheels) Toilet Transfer Details (indicate cue type and reason): simulated to bedside chair, step pivot                 Vision Patient Visual Report: No change from baseline       Perception  Praxis         Pertinent Vitals/Pain Pain Assessment Pain Assessment: Faces Faces Pain Scale: Hurts little more Pain Location: generalized Pain Intervention(s): Monitored during session     Extremity/Trunk Assessment Upper Extremity Assessment Upper Extremity Assessment: Right hand dominant;LUE deficits/detail LUE Deficits / Details: AROM/PROM grossly WFL; deficits noted in L hand strength, FMC/dexterity LUE Sensation: WNL LUE Coordination:  decreased fine motor;decreased gross motor   Lower Extremity Assessment Lower Extremity Assessment:  (baseline spasticity, pt reports she feels generally weak, is generally week from ongoing medical issues)       Communication Communication Communication: No apparent difficulties   Cognition Arousal: Alert Behavior During Therapy: WFL for tasks assessed/performed Cognition: No apparent impairments                               Following commands: Intact       Cueing  General Comments   Cueing Techniques: Verbal cues      Exercises Other Exercises Other Exercises: edu re role of OT, role of rehab, discharge recommendations   Shoulder Instructions      Home Living Family/patient expects to be discharged to:: Private residence Living Arrangements: Spouse/significant other;Children;Parent Available Help at Discharge: Family Type of Home: House Home Access: Stairs to enter     Home Layout: One level     Bathroom Shower/Tub: Walk-in shower (with a lip)         Home Equipment: Agricultural consultant (2 wheels);Rollator (4 wheels);Tub bench;Wheelchair - manual (has a custom K4 mwc however difficult to get around house currently- using transport mwc for around house)          Prior Functioning/Environment Prior Level of Function : Needs assist             Mobility Comments: amb very short distances/furnature walks, mostly using transport wheelchair right now household distances ADLs Comments: PRN assist for ADLs, family assists with IADLs right now    OT Problem List: Decreased activity tolerance;Decreased strength;Impaired balance (sitting and/or standing);Decreased cognition;Decreased knowledge of use of DME or AE;Impaired UE functional use   OT Treatment/Interventions: Self-care/ADL training;DME and/or AE instruction;Therapeutic activities;Balance training;Therapeutic exercise;Neuromuscular education;Manual therapy;Modalities;Energy  conservation;Patient/family education      OT Goals(Current goals can be found in the care plan section)   Acute Rehab OT Goals Patient Stated Goal: go home OT Goal Formulation: With patient Time For Goal Achievement: 06/28/23 Potential to Achieve Goals: Good ADL Goals Pt Will Perform Grooming: with modified independence;sitting Pt Will Perform Lower Body Dressing: with min assist;sitting/lateral leans;sit to/from stand Pt Will Transfer to Toilet: with modified independence Pt Will Perform Toileting - Clothing Manipulation and hygiene: with modified independence;sitting/lateral leans Pt/caregiver will Perform Home Exercise Program: Left upper extremity;Increased strength;Increased ROM;With written HEP provided   OT Frequency:  Min 1X/week    Co-evaluation              AM-PAC OT "6 Clicks" Daily Activity     Outcome Measure Help from another person eating meals?: None Help from another person taking care of personal grooming?: None Help from another person toileting, which includes using toliet, bedpan, or urinal?: A Little Help from another person bathing (including washing, rinsing, drying)?: A Little Help from another person to put on and taking off regular upper body clothing?: None Help from another person to put on and taking off regular lower body clothing?: A Lot 6 Click Score: 20   End of Session  Equipment Utilized During Treatment: Rolling walker (2 wheels) Nurse Communication: Mobility status  Activity Tolerance: Patient tolerated treatment well Patient left: in bed;with call bell/phone within reach;with family/visitor present  OT Visit Diagnosis: Other abnormalities of gait and mobility (R26.89)                Time: 1610-9604 OT Time Calculation (min): 25 min Charges:  OT General Charges $OT Visit: 1 Visit OT Evaluation $OT Eval Low Complexity: 1 Low Oleta Mouse, OTD OTR/L  06/14/23, 4:21 PM

## 2023-06-14 NOTE — Progress Notes (Signed)
 OT Cancellation Note  Patient Details Name: Rasa Degrazia MRN: [id number] DOB: [birth year]   Cancelled Treatment:    Reason Eval/Treat Not Completed: Other (comment) (in room to see patient for OT eval, pharmacy then enters for medication information and then transport enters for transport to ultrasound. OT will reattempt as able.Oleta Mouse, OTD OTR/L  06/14/23, 11:31 AM

## 2023-06-14 NOTE — Plan of Care (Signed)

## 2023-06-15 LAB — BETA-2-GLYCOPROTEIN I ABS, IGG/M/A
Beta-2 Glyco I IgG: <9 GPI IgG units (ref 0–20)
Beta-2-Glycoprotein I IgA: <9 GPI IgA units (ref 0–25)
Beta-2-Glycoprotein I IgM: <9 GPI IgM units (ref 0–32)

## 2023-06-15 LAB — LUPUS ANTICOAGULANT PANEL
DRVVT: 31.3 s (ref 0.0–47.0)
PTT Lupus Anticoagulant: 33.1 s (ref 0.0–43.5)

## 2023-06-15 LAB — CARDIOLIPIN ANTIBODIES, IGG, IGM, IGA
Anticardiolipin IgA: <9 [APL'U]/mL (ref 0–11)
Anticardiolipin IgG: <9 [GPL'U]/mL (ref 0–14)
Anticardiolipin IgM: <9 [MPL'U]/mL (ref 0–12)

## 2023-06-15 LAB — PROTEIN C ACTIVITY: Protein C Activity: 98 % (ref 73–180)

## 2023-06-15 LAB — PROTEIN S ACTIVITY: Protein S Activity: 101 % (ref 63–140)

## 2023-06-15 LAB — PROTEIN S, TOTAL: Protein S Ag, Total: 74 % (ref 60–150)

## 2023-06-15 LAB — HOMOCYSTEINE: Homocysteine: 8.6 umol/L (ref 0.0–14.5)

## 2023-06-17 LAB — PROTEIN C, TOTAL: Protein C, Total: 83 % (ref 60–150)

## 2023-06-18 ENCOUNTER — Ambulatory Visit: Payer: BC Managed Care – PPO

## 2023-06-18 LAB — FACTOR 5 LEIDEN

## 2023-06-19 LAB — PROTHROMBIN GENE MUTATION

## 2023-06-20 ENCOUNTER — Ambulatory Visit: Payer: BC Managed Care – PPO

## 2023-06-21 ENCOUNTER — Ambulatory Visit: Payer: BC Managed Care – PPO

## 2023-06-21 NOTE — Therapy (Signed)
 OUTPATIENT PHYSICAL THERAPY NEURO EVALUATION   Patient Name: Christine Chavez MRN: [id number] DOB:[birth year], 43 y.o., female Today's Date: 06/25/2023   PCP: Ardyth Man REFERRING PROVIDER: Cristopher Peru  END OF SESSION:  PT End of Session - 06/25/23 1321     Visit Number 1    Number of Visits 24    Date for PT Re-Evaluation 09/17/23    PT Start Time 1145    PT Stop Time 1230    PT Time Calculation (min) 45 min    Equipment Utilized During Treatment Gait belt    Activity Tolerance Patient tolerated treatment well;Patient limited by fatigue    Behavior During Therapy WFL for tasks assessed/performed             Past Medical History:  Diagnosis Date   Bladder retention    Complication of anesthesia    ? seizures after anesthesia    Depression    Diarrhea due to malabsorption    Dizziness    Headache    Migraines    Neurogenic bladder    Pyelonephritis    Renal disorder    Vision abnormalities    Weight loss    Past Surgical History:  Procedure Laterality Date   ANTERIOR CRUCIATE LIGAMENT REPAIR  1997   APPENDECTOMY     BLADDER REMOVAL     COLONOSCOPY WITH PROPOFOL N/A 11/11/2018   Procedure: COLONOSCOPY WITH PROPOFOL;  Surgeon: Christena Deem, MD;  Location: [facility] ENDOSCOPY;  Service: Endoscopy;  Laterality: N/A;   CYSTOSCOPY WITH STENT PLACEMENT Right 04/17/2016   Procedure: CYSTOSCOPY WITH STENT PLACEMENT;  Surgeon: Malen Gauze, MD;  Location: [facility] ORS;  Service: Urology;  Laterality: Right;   ESOPHAGOGASTRODUODENOSCOPY (EGD) WITH PROPOFOL N/A 11/11/2018   Procedure: ESOPHAGOGASTRODUODENOSCOPY (EGD) WITH PROPOFOL;  Surgeon: Christena Deem, MD;  Location: [facility] ENDOSCOPY;  Service: Endoscopy;  Laterality: N/A;   EXPLORATORY LAPAROTOMY  1999   HIP SURGERY Right    KIDNEY STONE SURGERY  04/2016   REVISION UROSTOMY CUTANEOUS     REVISION UROSTOMY CUTANEOUS  01/10/2018   SUPRAPUBIC CATHETER PLACEMENT  08/2017   TONSILLECTOMY     Patient  Active Problem List   Diagnosis Date Noted   Stroke-like symptoms 06/13/2023   Refractory nausea and vomiting 02/22/2023   Syncope and collapse 02/19/2023   Palliative care by specialist 02/19/2023   UTI (urinary tract infection) 02/13/2023   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   Neurogenic bladder 02/13/2023   Hepatic steatosis 02/13/2023   Wheelchair dependent 02/13/2023   Secondary adrenal insufficiency (HCC) 02/13/2023   Right flank pain 02/13/2023   Iron deficiency anemia 10/02/2022   Seizure (HCC) 11/11/2018   Major depressive disorder, recurrent episode, moderate (HCC) 02/07/2018   Nephrolithiasis 04/16/2016   Numbness 07/28/2015   Bladder retention 06/23/2015   Abdominal pain 06/04/2015   Dizziness 05/18/2015   Neck pain 05/18/2015   Complicated migraine 04/28/2015   Other fatigue 04/28/2015   Abnormal finding on MRI of brain 04/28/2015   Diarrhea 03/29/2015   H/O disease 03/29/2015   Abnormal weight loss 03/29/2015   Muscle weakness (generalized) 03/14/2015   Headache, migraine 03/10/2015    ONSET DATE: 06/13/23  REFERRING DIAG: CVA  THERAPY DIAG:  Unsteadiness on feet  Difficulty in walking, not elsewhere classified  Muscle weakness (generalized)  Rationale for Evaluation and Treatment: Rehabilitation  SUBJECTIVE:  SUBJECTIVE STATEMENT: Patient presents to PT s/p CVA on 06/13/23.  Pmh includes adrenal insufficiency, Hereditary spastic paraplegia (HSP), small fiber neuropathy c/b BLE paraplegia and neurogenic bladder s/p cystectomy w/ ileal condiuit (05/2022), chronic pain, migraine. Patient was recently hospitalized prior to this at [facility] from 04/30/23 to 05/27/23. Patient reports L sided weakness. Is having stabbing pains for a few minutes.  Pt accompanied by:  significant other  PERTINENT HISTORY:  Patient is referred to PT s/p CVA  PAIN:  Are you having pain?  Has been having sharp stabbing pains  PRECAUTIONS: Fall  RED FLAGS: None and hx of bladder removal      WEIGHT BEARING RESTRICTIONS: No  FALLS: Has patient fallen in last 6 months? Yes. Number of falls 3  LIVING ENVIRONMENT: Lives with: lives with their family and lives with their spouse Lives in: House/apartment Stairs:  ramp Has following equipment at home: Wheelchair (manual) and Ramped entry  PLOF: Independent with household mobility with device  PATIENT GOALS: to get stronger, do ADLs better.   OBJECTIVE:  Note: Objective measures were completed at Evaluation unless otherwise noted.  DIAGNOSTIC FINDINGS:  MRI results received from [facility] "1. 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 2. Adjacent small acute infarct within the posterior right frontal lobe white matter. 3. Background moderate multifocal T2 FLAIR hyperintense signal abnormality within the cerebral white matter (with a subcortical white matter predominance), greater than expected for age. These signal changes are nonspecific and differential considerations include accelerated chronic small vessel ischemic disease, sequelae of chronic migraine headaches, sequelae of vasculitis/hypercoagulable state and sequelae of a prior infectious/inflammatory process, among others.   MRA head:   Significantly motion degraded examination. No proximal intracranial large vessel occlusion is identified. Motion artifact precludes accurate assessment for stenoses and for vessel irregularity.    COGNITION: Overall cognitive status: Within functional limits for tasks assessed   SENSATION: Light touch: Impaired ; below R knee has decreased sensation   COORDINATION: Heel shin: tonic shaking movements    MUSCLE TONE: LLE:  Moderate RLE Moderate   POSTURE: forward head, increased thoracic kyphosis, posterior pelvic tilt, and flexed trunk     LOWER EXTREMITY MMT:    MMT Right Eval Left Eval  Hip flexion 6.2 5.6  Hip extension    Hip abduction 6.5 6.4  Hip adduction 3.4 3.4  Hip internal rotation    Hip external rotation    Knee flexion 5.1 1.0  Knee extension 7.1 3.2  Ankle dorsiflexion    Ankle plantarflexion    Ankle inversion    Ankle eversion    (Blank rows = not tested)  BED MOBILITY:  Will assess in future session.  TRANSFERS: Assistive device utilized: Environmental consultant - 4 wheeled  Sit to stand: CGA Stand to sit: CGA Chair to chair: Min A     GAIT: Gait pattern: step to pattern, knee flexed in stance- Right, knee flexed in stance- Left, ataxic, trunk flexed, poor foot clearance- Right, and poor foot clearance- Left Distance walked: 8 ft  Assistive device utilized: Walker - 4 wheeled Level of assistance: CGA and Min A Comments: Jerking tonic  gait pattern   FUNCTIONAL TESTS:  5 times sit to stand: 46.33 with hands  10 meter walk test: 1.04 for 8 ft; unable to finish  PATIENT SURVEYS:  Stroke Impact Scale give next session  TREATMENT DATE: 06/25/23   Eval and HEP   PATIENT EDUCATION: Education details: goals, POC  Person educated: Patient Education method: Explanation, Demonstration, Tactile cues, and Verbal cues Education comprehension: verbalized understanding, returned demonstration, verbal cues required, and tactile cues required  HOME EXERCISE PROGRAM: Access Code: [email or web address] URL: [email or web address] Date: 06/25/2023 Prepared by: Precious Bard  Exercises - Leg Extension  - 1 x daily - 7 x weekly - 2 sets - 10 reps - 5 hold - Seated March  - 1 x daily - 7 x weekly - 2 sets - 10 reps - 5 hold - Seated Hip Adduction Isometrics with  Ball  - 1 x daily - 7 x weekly - 2 sets - 10 reps - 5 hold - Seated Heel Toe Raises  - 1 x daily - 7 x weekly - 2 sets - 10 reps - 5 hold  GOALS: Goals reviewed with patient? Yes  SHORT TERM GOALS: Target date: 07/23/2023    Patient will be independent in home exercise program to improve strength/mobility for better functional independence with ADLs. Baseline: 3/10: give next session Goal status: INITIAL    LONG TERM GOALS: Target date: 09/17/2023  Patient (< 49 years old) will complete five times sit to stand test in < 10 seconds indicating an increased LE strength and improved balance. Baseline: 3/10: 46.33 Goal status: INITIAL  2.  Patient will perform 10 MWT under 2 minutes with no rest breaks indicating improved functional ambulation. Baseline: 3/10: 8 ft in 1 min 4 seconds Goal status: INITIAL  3.  Patient will tolerate standing 3 minutes for ADL performance Baseline: 5/40: knee buckling in standing Goal status: INITIAL  4.  Patient will increase BLE gross strength to 4+/5 and/or within 1lb of each limb  as to improve functional strength for independent gait, increased standing tolerance and increased ADL ability. Baseline: 3/10: see above  Goal status: INITIAL    ASSESSMENT:  CLINICAL IMPRESSION: Patient is a pleasant 43 y.o. female who was seen today for physical therapy evaluation and treatment for CVA. Patient is unable to ambulate full length of 10 MWT goal, ambulating 8 ft in 1 minute 4 seconds. She has tonic jerking gait pattern with buckling of BLE. Heavy support on rollator required. She is able to transfer sit to stand with increased time and heavy reliance upon Ues. Patient educated on HEP and how to perform. Patient will benefit from skilled physical therapy to increase functional mobility, strength, and independence with ADLs.   OBJECTIVE IMPAIRMENTS: Abnormal gait, decreased activity tolerance, decreased balance, decreased coordination, decreased endurance,  decreased mobility, difficulty walking, decreased strength, dizziness, impaired perceived functional ability, increased muscle spasms, impaired flexibility, improper body mechanics, and postural dysfunction.   ACTIVITY LIMITATIONS: carrying, lifting, bending, sitting, standing, squatting, sleeping, stairs, transfers, bed mobility, bathing, toileting, dressing, reach over head, hygiene/grooming, locomotion level, and caring for others  PARTICIPATION LIMITATIONS: meal prep, cleaning, laundry, interpersonal relationship, driving, shopping, community activity, yard work, and church  PERSONAL FACTORS: Age, Past/current experiences, Time since onset of injury/illness/exacerbation, Transportation, and 3+ comorbidities: adrenal insufficiency, Hereditary spastic paraplegia (HSP), small fiber neuropathy c/b BLE paraplegia and neurogenic bladder s/p cystectomy w/ ileal condiuit (05/2022), chronic pain, migraine  are also affecting patient's functional outcome.   REHAB POTENTIAL: Good  CLINICAL DECISION MAKING: Evolving/moderate complexity  EVALUATION COMPLEXITY: Moderate  PLAN:  PT FREQUENCY: 2x/week  PT DURATION: 12 weeks  PLANNED INTERVENTIONS: 97164- PT Re-evaluation, 97110-Therapeutic exercises, 97530- Therapeutic activity, [id number]- Neuromuscular re-education, 97535- Self Care, 98119- Manual  therapy, [id number]- Gait training, 04540- Orthotic Fit/training, 98119- Canalith repositioning, 14782- Splinting, [phone]- Electrical stimulation (unattended), [phone]- Electrical stimulation (manual), [id number]- Vasopneumatic device, [id number]- Ultrasound, [id number]- Traction (mechanical), [id number]- Ionotophoresis 4mg /ml Dexamethasone, Patient/Family education, Balance training, Stair training, Taping, Dry Needling, Joint mobilization, Joint manipulation, Spinal manipulation, Spinal mobilization, Scar mobilization, Compression bandaging, Vestibular training, Visual/preceptual remediation/compensation, DME instructions, Wheelchair mobility  training, Cryotherapy, Moist heat, and Biofeedback  PLAN FOR NEXT SESSION: strengthening, balance    Precious Bard, PT 06/25/2023, 1:27 PM

## 2023-06-25 ENCOUNTER — Ambulatory Visit: Payer: BC Managed Care – PPO | Attending: Neurology

## 2023-06-25 ENCOUNTER — Ambulatory Visit: Payer: BC Managed Care – PPO

## 2023-06-25 DIAGNOSIS — R262 Difficulty in walking, not elsewhere classified: Secondary | ICD-10-CM | POA: Diagnosis present

## 2023-06-25 DIAGNOSIS — M25552 Pain in left hip: Secondary | ICD-10-CM | POA: Insufficient documentation

## 2023-06-25 DIAGNOSIS — R2681 Unsteadiness on feet: Secondary | ICD-10-CM | POA: Diagnosis present

## 2023-06-25 DIAGNOSIS — R278 Other lack of coordination: Secondary | ICD-10-CM | POA: Diagnosis present

## 2023-06-25 DIAGNOSIS — M6281 Muscle weakness (generalized): Secondary | ICD-10-CM | POA: Diagnosis present

## 2023-06-26 NOTE — Therapy (Signed)
 OUTPATIENT PHYSICAL THERAPY NEURO TREATMENT   Patient Name: Christine Chavez MRN: [id number] DOB:[birth year], 43 y.o., female Today's Date: 06/27/2023   PCP: Ardyth Man REFERRING PROVIDER: Cristopher Peru  END OF SESSION:  PT End of Session - 06/27/23 1058     Visit Number 2    Number of Visits 24    Date for PT Re-Evaluation 09/17/23    PT Start Time 1100    PT Stop Time 1144    PT Time Calculation (min) 44 min    Equipment Utilized During Treatment Gait belt    Activity Tolerance Patient tolerated treatment well;Patient limited by fatigue    Behavior During Therapy WFL for tasks assessed/performed              Past Medical History:  Diagnosis Date   Bladder retention    Complication of anesthesia    ? seizures after anesthesia    Depression    Diarrhea due to malabsorption    Dizziness    Headache    Migraines    Neurogenic bladder    Pyelonephritis    Renal disorder    Vision abnormalities    Weight loss    Past Surgical History:  Procedure Laterality Date   ANTERIOR CRUCIATE LIGAMENT REPAIR  1997   APPENDECTOMY     BLADDER REMOVAL     COLONOSCOPY WITH PROPOFOL N/A 11/11/2018   Procedure: COLONOSCOPY WITH PROPOFOL;  Surgeon: Christena Deem, MD;  Location: [facility] ENDOSCOPY;  Service: Endoscopy;  Laterality: N/A;   CYSTOSCOPY WITH STENT PLACEMENT Right 04/17/2016   Procedure: CYSTOSCOPY WITH STENT PLACEMENT;  Surgeon: Malen Gauze, MD;  Location: [facility] ORS;  Service: Urology;  Laterality: Right;   ESOPHAGOGASTRODUODENOSCOPY (EGD) WITH PROPOFOL N/A 11/11/2018   Procedure: ESOPHAGOGASTRODUODENOSCOPY (EGD) WITH PROPOFOL;  Surgeon: Christena Deem, MD;  Location: [facility] ENDOSCOPY;  Service: Endoscopy;  Laterality: N/A;   EXPLORATORY LAPAROTOMY  1999   HIP SURGERY Right    KIDNEY STONE SURGERY  04/2016   REVISION UROSTOMY CUTANEOUS     REVISION UROSTOMY CUTANEOUS  01/10/2018   SUPRAPUBIC CATHETER PLACEMENT  08/2017   TONSILLECTOMY     Patient  Active Problem List   Diagnosis Date Noted   Stroke-like symptoms 06/13/2023   Refractory nausea and vomiting 02/22/2023   Syncope and collapse 02/19/2023   Palliative care by specialist 02/19/2023   UTI (urinary tract infection) 02/13/2023   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   Neurogenic bladder 02/13/2023   Hepatic steatosis 02/13/2023   Wheelchair dependent 02/13/2023   Secondary adrenal insufficiency (HCC) 02/13/2023   Right flank pain 02/13/2023   Iron deficiency anemia 10/02/2022   Seizure (HCC) 11/11/2018   Major depressive disorder, recurrent episode, moderate (HCC) 02/07/2018   Nephrolithiasis 04/16/2016   Numbness 07/28/2015   Bladder retention 06/23/2015   Abdominal pain 06/04/2015   Dizziness 05/18/2015   Neck pain 05/18/2015   Complicated migraine 04/28/2015   Other fatigue 04/28/2015   Abnormal finding on MRI of brain 04/28/2015   Diarrhea 03/29/2015   H/O disease 03/29/2015   Abnormal weight loss 03/29/2015   Muscle weakness (generalized) 03/14/2015   Headache, migraine 03/10/2015    ONSET DATE: 06/13/23  REFERRING DIAG: CVA  THERAPY DIAG:  Unsteadiness on feet  Difficulty in walking, not elsewhere classified  Muscle weakness (generalized)  Pain in left hip  Rationale for Evaluation and Treatment: Rehabilitation  SUBJECTIVE:  SUBJECTIVE STATEMENT: Patient presents with husband. Had a rough day after PT last session.  Pt accompanied by: significant other  PERTINENT HISTORY:  Patient is referred to PT s/p CVA.  Patient presents to PT s/p CVA on 06/13/23.  Pmh includes adrenal insufficiency, Hereditary spastic paraplegia (HSP), small fiber neuropathy c/b BLE paraplegia and neurogenic bladder s/p cystectomy w/ ileal condiuit (05/2022), chronic pain, migraine.  Patient was recently hospitalized prior to this at [facility] from 04/30/23 to 05/27/23. Patient reports L sided weakness. Is having stabbing pains for a few minutes.   PAIN:  Are you having pain?  Has been having sharp stabbing pains  PRECAUTIONS: Fall  RED FLAGS: None and hx of bladder removal      WEIGHT BEARING RESTRICTIONS: No  FALLS: Has patient fallen in last 6 months? Yes. Number of falls 3  LIVING ENVIRONMENT: Lives with: lives with their family and lives with their spouse Lives in: House/apartment Stairs:  ramp Has following equipment at home: Wheelchair (manual) and Ramped entry  PLOF: Independent with household mobility with device  PATIENT GOALS: to get stronger, do ADLs better.   OBJECTIVE:  Note: Objective measures were completed at Evaluation unless otherwise noted.  DIAGNOSTIC FINDINGS:  MRI results received from [facility] "1. 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 2. Adjacent small acute infarct within the posterior right frontal lobe white matter. 3. Background moderate multifocal T2 FLAIR hyperintense signal abnormality within the cerebral white matter (with a subcortical white matter predominance), greater than expected for age. These signal changes are nonspecific and differential considerations include accelerated chronic small vessel ischemic disease, sequelae of chronic migraine headaches, sequelae of vasculitis/hypercoagulable state and sequelae of a prior infectious/inflammatory process, among others.   MRA head:   Significantly motion degraded examination. No proximal intracranial large vessel occlusion is identified. Motion artifact precludes accurate assessment for stenoses and for vessel irregularity.    COGNITION: Overall cognitive status: Within functional limits for tasks assessed   SENSATION: Light touch: Impaired ; below R knee has decreased  sensation   COORDINATION: Heel shin: tonic shaking movements    MUSCLE TONE: LLE: Moderate RLE Moderate   POSTURE: forward head, increased thoracic kyphosis, posterior pelvic tilt, and flexed trunk     LOWER EXTREMITY MMT:    MMT Right Eval Left Eval  Hip flexion 6.2 5.6  Hip extension    Hip abduction 6.5 6.4  Hip adduction 3.4 3.4  Hip internal rotation    Hip external rotation    Knee flexion 5.1 1.0  Knee extension 7.1 3.2  Ankle dorsiflexion    Ankle plantarflexion    Ankle inversion    Ankle eversion    (Blank rows = not tested)  BED MOBILITY:  Will assess in future session.  TRANSFERS: Assistive device utilized: Environmental consultant - 4 wheeled  Sit to stand: CGA Stand to sit: CGA Chair to chair: Min A     GAIT: Gait pattern: step to pattern, knee flexed in stance- Right, knee flexed in stance- Left, ataxic, trunk flexed, poor foot clearance- Right, and poor foot clearance- Left Distance walked: 8 ft  Assistive device utilized: Walker - 4 wheeled Level of assistance: CGA and Min A Comments: Jerking tonic  gait pattern   FUNCTIONAL TESTS:  5 times sit to stand: 46.33 with hands  10 meter walk test: 1.04 for 8 ft; unable to finish  PATIENT SURVEYS:  Stroke Impact Scale give next session  TREATMENT DATE: 06/27/23   TherAct: In // bars: -forward step to halfway to the // bars: turn around return to chair; x2 sets -Static stand and alternate tapping PT shoulder for stabilization standing training  TherEx Supine: Bridge 10x Contract relax press into PT shoulder x10 each LE Hamstring isometric 10x; 2 sets Abduction/adduction 10x Hamstring stretch 60 seconds each LE Single knee to chest 60 seconds each LE Figure four stretch 60 seconds each LE  Seated: LAQ 10x March 10x  PATIENT EDUCATION: Education details: goals, POC  Person  educated: Patient Education method: Explanation, Demonstration, Tactile cues, and Verbal cues Education comprehension: verbalized understanding, returned demonstration, verbal cues required, and tactile cues required  HOME EXERCISE PROGRAM: Access Code: [email or web address] URL: [email or web address] Date: 06/25/2023 Prepared by: Precious Bard  Exercises - Leg Extension  - 1 x daily - 7 x weekly - 2 sets - 10 reps - 5 hold - Seated March  - 1 x daily - 7 x weekly - 2 sets - 10 reps - 5 hold - Seated Hip Adduction Isometrics with Ball  - 1 x daily - 7 x weekly - 2 sets - 10 reps - 5 hold - Seated Heel Toe Raises  - 1 x daily - 7 x weekly - 2 sets - 10 reps - 5 hold  GOALS: Goals reviewed with patient? Yes  SHORT TERM GOALS: Target date: 07/23/2023    Patient will be independent in home exercise program to improve strength/mobility for better functional independence with ADLs. Baseline: 3/10: give next session Goal status: INITIAL    LONG TERM GOALS: Target date: 09/17/2023  Patient (< 7 years old) will complete five times sit to stand test in < 10 seconds indicating an increased LE strength and improved balance. Baseline: 3/10: 46.33 Goal status: INITIAL  2.  Patient will perform 10 MWT under 2 minutes with no rest breaks indicating improved functional ambulation. Baseline: 3/10: 8 ft in 1 min 4 seconds Goal status: INITIAL  3.  Patient will tolerate standing 3 minutes for ADL performance Baseline: 5/40: knee buckling in standing Goal status: INITIAL  4.  Patient will increase BLE gross strength to 4+/5 and/or within 1lb of each limb  as to improve functional strength for independent gait, increased standing tolerance and increased ADL ability. Baseline: 3/10: see above  Goal status: INITIAL    ASSESSMENT:  CLINICAL IMPRESSION: Patient presents with excellent motivation. She has trembling of her LE's in standing and with LAQs. Increased tone with fatigue noted. She is  limited in her ability to stand for prolonged periods of time. Patient will benefit from skilled physical therapy to increase functional mobility, strength, and independence with ADLs.   OBJECTIVE IMPAIRMENTS: Abnormal gait, decreased activity tolerance, decreased balance, decreased coordination, decreased endurance, decreased mobility, difficulty walking, decreased strength, dizziness, impaired perceived functional ability, increased muscle spasms, impaired flexibility, improper body mechanics, and postural dysfunction.   ACTIVITY LIMITATIONS: carrying, lifting, bending, sitting, standing, squatting, sleeping, stairs, transfers, bed mobility, bathing, toileting, dressing, reach over head, hygiene/grooming, locomotion level, and caring for others  PARTICIPATION LIMITATIONS: meal prep, cleaning, laundry, interpersonal relationship, driving, shopping, community activity, yard work, and church  PERSONAL FACTORS: Age, Past/current experiences, Time since onset of injury/illness/exacerbation, Transportation, and 3+ comorbidities: adrenal insufficiency, Hereditary spastic paraplegia (HSP), small fiber neuropathy c/b BLE paraplegia and neurogenic bladder s/p cystectomy w/ ileal condiuit (05/2022), chronic pain, migraine  are also affecting patient's functional outcome.   REHAB POTENTIAL: Good  CLINICAL DECISION MAKING: Evolving/moderate complexity  EVALUATION COMPLEXITY:  Moderate  PLAN:  PT FREQUENCY: 2x/week  PT DURATION: 12 weeks  PLANNED INTERVENTIONS: 97164- PT Re-evaluation, 97110-Therapeutic exercises, 97530- Therapeutic activity, [id number]- Neuromuscular re-education, 97535- Self Care, 57846- Manual therapy, [phone]- Gait training, [phone]- Orthotic Fit/training, [phone]- Canalith repositioning, [id number]- Splinting, U2725- Electrical stimulation (unattended), [phone]- Electrical stimulation (manual), [id number]- Vasopneumatic device, [id number]- Ultrasound, [id number]- Traction (mechanical), [id number]- Ionotophoresis 4mg /ml  Dexamethasone, Patient/Family education, Balance training, Stair training, Taping, Dry Needling, Joint mobilization, Joint manipulation, Spinal manipulation, Spinal mobilization, Scar mobilization, Compression bandaging, Vestibular training, Visual/preceptual remediation/compensation, DME instructions, Wheelchair mobility training, Cryotherapy, Moist heat, and Biofeedback  PLAN FOR NEXT SESSION: strengthening, balance    Precious Bard, PT 06/27/2023, 12:57 PM

## 2023-06-27 ENCOUNTER — Ambulatory Visit: Payer: BC Managed Care – PPO

## 2023-06-27 ENCOUNTER — Ambulatory Visit: Admitting: Occupational Therapy

## 2023-06-27 DIAGNOSIS — R2681 Unsteadiness on feet: Secondary | ICD-10-CM | POA: Diagnosis not present

## 2023-06-27 DIAGNOSIS — M6281 Muscle weakness (generalized): Secondary | ICD-10-CM

## 2023-06-27 DIAGNOSIS — R262 Difficulty in walking, not elsewhere classified: Secondary | ICD-10-CM

## 2023-06-27 DIAGNOSIS — M25552 Pain in left hip: Secondary | ICD-10-CM

## 2023-06-27 NOTE — Therapy (Addendum)
 OUTPATIENT OCCUPATIONAL THERAPY NEURO EVALUATION  Patient Name: Christine Chavez MRN: [id number] DOB:1980/08/12, 43 y.o., female Today's Date: 06/27/2023  PCP: Ardyth Man, PA-C REFERRING PROVIDER: Lonell Face, MD  END OF SESSION:  OT End of Session - 06/27/23 1416     Visit Number 1    Number of Visits 24    Date for OT Re-Evaluation 09/19/23    OT Start Time 1145    OT Stop Time 1230    OT Time Calculation (min) 45 min    Activity Tolerance Patient tolerated treatment well    Behavior During Therapy WFL for tasks assessed/performed             Past Medical History:  Diagnosis Date   Bladder retention    Complication of anesthesia    ? seizures after anesthesia    Depression    Diarrhea due to malabsorption    Dizziness    Headache    Migraines    Neurogenic bladder    Pyelonephritis    Renal disorder    Vision abnormalities    Weight loss    Past Surgical History:  Procedure Laterality Date   ANTERIOR CRUCIATE LIGAMENT REPAIR  1997   APPENDECTOMY     BLADDER REMOVAL     COLONOSCOPY WITH PROPOFOL N/A 11/11/2018   Procedure: COLONOSCOPY WITH PROPOFOL;  Surgeon: Christena Deem, MD;  Location: [facility] ENDOSCOPY;  Service: Endoscopy;  Laterality: N/A;   CYSTOSCOPY WITH STENT PLACEMENT Right 04/17/2016   Procedure: CYSTOSCOPY WITH STENT PLACEMENT;  Surgeon: Malen Gauze, MD;  Location: [facility] ORS;  Service: Urology;  Laterality: Right;   ESOPHAGOGASTRODUODENOSCOPY (EGD) WITH PROPOFOL N/A 11/11/2018   Procedure: ESOPHAGOGASTRODUODENOSCOPY (EGD) WITH PROPOFOL;  Surgeon: Christena Deem, MD;  Location: [facility] ENDOSCOPY;  Service: Endoscopy;  Laterality: N/A;   EXPLORATORY LAPAROTOMY  1999   HIP SURGERY Right    KIDNEY STONE SURGERY  04/2016   REVISION UROSTOMY CUTANEOUS     REVISION UROSTOMY CUTANEOUS  01/10/2018   SUPRAPUBIC CATHETER PLACEMENT  08/2017   TONSILLECTOMY     Patient Active Problem List   Diagnosis Date Noted   Stroke-like symptoms  06/13/2023   Refractory nausea and vomiting 02/22/2023   Syncope and collapse 02/19/2023   Palliative care by specialist 02/19/2023   UTI (urinary tract infection) 02/13/2023   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   Neurogenic bladder 02/13/2023   Hepatic steatosis 02/13/2023   Wheelchair dependent 02/13/2023   Secondary adrenal insufficiency (HCC) 02/13/2023   Right flank pain 02/13/2023   Iron deficiency anemia 10/02/2022   Seizure (HCC) 11/11/2018   Major depressive disorder, recurrent episode, moderate (HCC) 02/07/2018   Nephrolithiasis 04/16/2016   Numbness 07/28/2015   Bladder retention 06/23/2015   Abdominal pain 06/04/2015   Dizziness 05/18/2015   Neck pain 05/18/2015   Complicated migraine 04/28/2015   Other fatigue 04/28/2015   Abnormal finding on MRI of brain 04/28/2015   Diarrhea 03/29/2015   H/O disease 03/29/2015   Abnormal weight loss 03/29/2015   Muscle weakness (generalized) 03/14/2015   Headache, migraine 03/10/2015    ONSET DATE: 06/13/23  REFERRING DIAG: CVA, HSP  THERAPY DIAG:  Muscle weakness (generalized)  Rationale for Evaluation and Treatment: Rehabilitation  SUBJECTIVE:   SUBJECTIVE STATEMENT: Pt. reports having been in and out of the hospital a lot this past year. Pt accompanied by: significant other Husband: Paul  PERTINENT HISTORY:   Pt. is a 43 y.o. female who was hospitalized with a CVA from 06/13/23-06/14/23.  Imaging  revealed two small acute cortical infarcts within the posterior Right frontal lobe (with involvement in the motor strip). PMHx includes: HSP With a  recent hospitalization from January 13-February 19th at [facility] 2/2 a Neuro Flare from the HSP. Hx of small fiber neuropathy d/t BLE paraplegia, Neurogenic bladder s/p cystectomy with ideal conduit (2/24) ,chronic pain, migraine. Pt. Reports multiple hospitalizations of the past year.  PRECAUTIONS: None  WEIGHT BEARING RESTRICTIONS: No  PAIN:  Are you having  pain? 4/10  in right elbow /proximal forearm pain. (Pt. Reports history of intermittent pain in the BUEs)    FALLS: Has patient fallen in last 6 months? Yes. Number of falls 3  LIVING ENVIRONMENT: Lives with: lives with their family Lives in: House/apartment, One level Stairs:  ramp. Has following equipment at home: Dan Humphreys - 4 wheeled, Wheelchair (power), Wheelchair (manual), Shower bench, and bed side commode, bed rail  PLOF: Independent  PATIENT GOALS:  Regain strength, and hand coordination  OBJECTIVE:  Note: Objective measures were completed at Evaluation unless otherwise noted.  HAND DOMINANCE: Right  ADLs: Overall ADLs:  Transfers/ambulation related to ADLs: Eating:  Independent, Cutting. Difficulty opening  Grooming: Independent UB Dressing: Independent LB Dressing: Independent, set-up Toileting:  Independent, Ostomy Bag Bathing: Independent once in the shower            Shower transfers: Husband assists with shower transfers.   IADLs: Shopping: Orders online. Husband picks them up Light housekeeping: Family assists Meal Prep: Engineer, structural, Assist with meals, cutting Community mobility: Relies on family, and friends Medication management: Independent Financial management: No change Handwriting: 100% legible, Pain/fatigues with writing note cards. Hobbies: Making gifts baskets; shopping ([employer] is a favorite)  MOBILITY STATUS: Uses a w/c  POSTURE COMMENTS:   Sitting balance:  WFL  ACTIVITY TOLERANCE: Activity tolerance: limited  FUNCTIONAL OUTCOME MEASURES: MAM-20: Sum score: 73/80; MAM Measure score: 71.8  UPPER EXTREMITY ROM:    Active ROM Right Eval WFL Left Eval [facility]  Shoulder flexion    Shoulder abduction    Shoulder adduction    Shoulder extension    Shoulder internal rotation    Shoulder external rotation    Elbow flexion    Elbow extension    Wrist flexion    Wrist extension    Wrist ulnar deviation    Wrist radial deviation    Wrist  pronation    Wrist supination    (Blank rows = not tested)  UPPER EXTREMITY MMT:     MMT Right eval Left eval  Shoulder flexion 3+/5 3+/5  Shoulder abduction 3+/5 3+/5  Shoulder adduction    Shoulder extension    Shoulder internal rotation    Shoulder external rotation    Middle trapezius    Lower trapezius    Elbow flexion 3+/5 3+/5  Elbow extension 3+/5 3+/5  Wrist flexion    Wrist extension 3+/5 3+/5  Wrist ulnar deviation    Wrist radial deviation    Wrist pronation 3+/5 3+/5  Wrist supination 3+/5 3+/5  (Blank rows = not tested)  HAND FUNCTION: Grip strength: Right: 5 lbs; Left: 2 lbs and Lateral pinch: Right: 5 lbs, Left: 2 lbs, 3pt. Pinch: R: 5 lbs, L: 2 lbs; 2pt. Pinch: R: 1 lbs, L:1 lb  COORDINATION: 9 Hole Peg test: Right: 25 sec; Left: 31 sec  SENSATION: WFL light touch, and proprioceptive awareness  EDEMA: N/A   COGNITION: Overall cognitive status: Within functional limits for tasks assessed  VISION: Subjective report: Reports blurriness  PERCEPTION:  WFL  PRAXIS: WFL                                                                                                                           TREATMENT DATE: 06/27/2023   Initial evaluation was completed. Education was provided as indicated below       PATIENT EDUCATION: Education details: OT services, POC, goals, Pt. Functional status Person educated: Patient and Spouse Education method: Demonstration and Verbal cues,verbal cues Education comprehension: verbalized understanding and needs further education  HOME EXERCISE PROGRAM:  Continue ongoing assessment, and provide as indicated   GOALS: Goals reviewed with patient? Yes  SHORT TERM GOALS: Target date: 08/08/2023    Pt. Will be independent with HEPs for BUEs. Baseline:No current HEP Goal status: INITIAL   LONG TERM GOALS: Target date: 09/19/2023    Pt. Will be improve BUE strength by 2 mm grades to be able to independently reach  for ADL/IADL items in multiple planes. Baseline: Eval: BUE strength: 3+/5 overall  Goal status: INITIAL  2.  Pt. will improve bilateral grip strength to be able to securely hold items. Baseline: Eval: R: 5#, L: 2# Goal status: INITIAL  3.  Pt. Will improve bilateral lateral pinch strength by 3#  to be able to open medication bottles Baseline: Eval: R: 5#, L: 2# Goal status: INITIAL  4.  Pt. Will improve bilateral 3pt. pinch strength to be able to independently use a nail clipper Baseline: Eval: R: 3#, L: 2# Goal status: INITIAL  5.  Pt. Will will improve [facility] skills to be able to manipulate small objects. Baseline: Eval: R: 25 sec., L: 31 sec. Goal status: INITIAL  ASSESSMENT:  CLINICAL IMPRESSION:  Patient is a 43 y.o. female who was seen today for occupational therapy evaluation for CVA, and HSP. Pt. presents with BUE weakness, decreased grip strength, pinch strength, and FMC skills which limits her ability to reach for and securely hold items, open bottles and medication containers, use nail clippers, and manipulate small objects. Pt. will benefit from OT services to work on improving BUE functioning in order to improve engagement in, and participation in ADL, and IADL functioning.  PERFORMANCE DEFICITS: in functional skills including ADLs, IADLs, coordination, dexterity, proprioception, ROM, strength, Fine motor control, Gross motor control, and UE functional use, cognitive skills including , and psychosocial skills including environmental adaptation, routines, and behaviors.   IMPAIRMENTS: are limiting patient from ADLs, IADLs, and leisure.   CO-MORBIDITIES: may have co-morbidities  that affects occupational performance. Patient will benefit from skilled OT to address above impairments and improve overall function.  MODIFICATION OR ASSISTANCE TO COMPLETE EVALUATION: Min-Moderate modification of tasks or assist with assess necessary to complete an evaluation.  OT OCCUPATIONAL  PROFILE AND HISTORY: Detailed assessment: Review of records and additional review of physical, cognitive, psychosocial history related to current functional performance.  CLINICAL DECISION MAKING: Moderate - several treatment options, min-mod task modification necessary  REHAB POTENTIAL: Good  EVALUATION COMPLEXITY: Moderate  PLAN:  OT FREQUENCY: 2x/week  OT DURATION: 12 weeks  PLANNED INTERVENTIONS: 97168 OT Re-evaluation, 97535 self care/ADL training, 10626 therapeutic exercise, 97530 therapeutic activity, 97112 neuromuscular re-education, 97018 paraffin, 94854 contrast bath, 97760 Orthotics management and training, 62703 Splinting (initial encounter), functional mobility training, patient/family education, and DME and/or AE instructions  RECOMMENDED OTHER SERVICES: PT  CONSULTED AND AGREED WITH PLAN OF CARE: Patient and family member/caregiver  PLAN FOR NEXT SESSION: Treatment   Olegario Messier, MS, OTR/L  06/27/2023, 2:25 PM

## 2023-06-28 ENCOUNTER — Ambulatory Visit: Payer: BC Managed Care – PPO

## 2023-07-02 ENCOUNTER — Ambulatory Visit: Payer: BC Managed Care – PPO

## 2023-07-02 ENCOUNTER — Telehealth: Payer: Self-pay | Admitting: Student

## 2023-07-02 NOTE — Telephone Encounter (Signed)
 Pt returning call to a nurse about appt tomorrow

## 2023-07-02 NOTE — Telephone Encounter (Signed)
-----   Message from Carlos Levering sent at 06/30/2023  3:55 PM EDT ----- Leary Roca!  This patient is on my schedule for Tuesday. However, #1 she is a new patient who has never been seen by anyone in our practice. And #2 it looks like Sheri ordered a heart monitor for her while she was in the hospital but it does not appear it was ever completed. This patient will need to be seen either in the [facility] or by an MD as a new patient visit.   Thank you! DW

## 2023-07-03 ENCOUNTER — Ambulatory Visit: Payer: BC Managed Care – PPO | Admitting: Student

## 2023-07-03 NOTE — Therapy (Signed)
 OUTPATIENT PHYSICAL THERAPY NEURO TREATMENT   Patient Name: Christine Chavez MRN: [id number] DOB:04/20/1980, 43 y.o., female Today's Date: 07/04/2023   PCP: Ardyth Man REFERRING PROVIDER: Cristopher Peru  END OF SESSION:  PT End of Session - 07/04/23 1105     Visit Number 3    Number of Visits 24    Date for PT Re-Evaluation 09/17/23    PT Start Time 1105    PT Stop Time 1144    PT Time Calculation (min) 39 min    Equipment Utilized During Treatment Gait belt    Activity Tolerance Patient tolerated treatment well;Patient limited by fatigue    Behavior During Therapy WFL for tasks assessed/performed               Past Medical History:  Diagnosis Date   Bladder retention    Complication of anesthesia    ? seizures after anesthesia    Depression    Diarrhea due to malabsorption    Dizziness    Headache    Migraines    Neurogenic bladder    Pyelonephritis    Renal disorder    Vision abnormalities    Weight loss    Past Surgical History:  Procedure Laterality Date   ANTERIOR CRUCIATE LIGAMENT REPAIR  1997   APPENDECTOMY     BLADDER REMOVAL     COLONOSCOPY WITH PROPOFOL N/A 11/11/2018   Procedure: COLONOSCOPY WITH PROPOFOL;  Surgeon: Christena Deem, MD;  Location: [facility] ENDOSCOPY;  Service: Endoscopy;  Laterality: N/A;   CYSTOSCOPY WITH STENT PLACEMENT Right 04/17/2016   Procedure: CYSTOSCOPY WITH STENT PLACEMENT;  Surgeon: Malen Gauze, MD;  Location: [facility] ORS;  Service: Urology;  Laterality: Right;   ESOPHAGOGASTRODUODENOSCOPY (EGD) WITH PROPOFOL N/A 11/11/2018   Procedure: ESOPHAGOGASTRODUODENOSCOPY (EGD) WITH PROPOFOL;  Surgeon: Christena Deem, MD;  Location: [facility] ENDOSCOPY;  Service: Endoscopy;  Laterality: N/A;   EXPLORATORY LAPAROTOMY  1999   HIP SURGERY Right    KIDNEY STONE SURGERY  04/2016   REVISION UROSTOMY CUTANEOUS     REVISION UROSTOMY CUTANEOUS  01/10/2018   SUPRAPUBIC CATHETER PLACEMENT  08/2017   TONSILLECTOMY     Patient  Active Problem List   Diagnosis Date Noted   Stroke-like symptoms 06/13/2023   Refractory nausea and vomiting 02/22/2023   Syncope and collapse 02/19/2023   Palliative care by specialist 02/19/2023   UTI (urinary tract infection) 02/13/2023   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   Neurogenic bladder 02/13/2023   Hepatic steatosis 02/13/2023   Wheelchair dependent 02/13/2023   Secondary adrenal insufficiency (HCC) 02/13/2023   Right flank pain 02/13/2023   Iron deficiency anemia 10/02/2022   Seizure (HCC) 11/11/2018   Major depressive disorder, recurrent episode, moderate (HCC) 02/07/2018   Nephrolithiasis 04/16/2016   Numbness 07/28/2015   Bladder retention 06/23/2015   Abdominal pain 06/04/2015   Dizziness 05/18/2015   Neck pain 05/18/2015   Complicated migraine 04/28/2015   Other fatigue 04/28/2015   Abnormal finding on MRI of brain 04/28/2015   Diarrhea 03/29/2015   H/O disease 03/29/2015   Abnormal weight loss 03/29/2015   Muscle weakness (generalized) 03/14/2015   Headache, migraine 03/10/2015    ONSET DATE: 06/13/23  REFERRING DIAG: CVA  THERAPY DIAG:  Muscle weakness (generalized)  Unsteadiness on feet  Difficulty in walking, not elsewhere classified  Pain in left hip  Rationale for Evaluation and Treatment: Rehabilitation  SUBJECTIVE:  SUBJECTIVE STATEMENT: Patient reports on and off spasms in arms causing severe pain.  Had some in her R leg last night Pt accompanied by: significant other  PERTINENT HISTORY:  Patient is referred to PT s/p CVA.  Patient presents to PT s/p CVA on 06/13/23.  Pmh includes adrenal insufficiency, Hereditary spastic paraplegia (HSP), small fiber neuropathy c/b BLE paraplegia and neurogenic bladder s/p cystectomy w/ ileal condiuit  (05/2022), chronic pain, migraine. Patient was recently hospitalized prior to this at [facility] from 04/30/23 to 05/27/23. Patient reports L sided weakness. Is having stabbing pains for a few minutes.   PAIN:  Are you having pain?  Has been having sharp stabbing pains  PRECAUTIONS: Fall  RED FLAGS: None and hx of bladder removal      WEIGHT BEARING RESTRICTIONS: No  FALLS: Has patient fallen in last 6 months? Yes. Number of falls 3  LIVING ENVIRONMENT: Lives with: lives with their family and lives with their spouse Lives in: House/apartment Stairs:  ramp Has following equipment at home: Wheelchair (manual) and Ramped entry  PLOF: Independent with household mobility with device  PATIENT GOALS: to get stronger, do ADLs better.   OBJECTIVE:  Note: Objective measures were completed at Evaluation unless otherwise noted.  DIAGNOSTIC FINDINGS:  MRI results received from [facility] "1. 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 2. Adjacent small acute infarct within the posterior right frontal lobe white matter. 3. Background moderate multifocal T2 FLAIR hyperintense signal abnormality within the cerebral white matter (with a subcortical white matter predominance), greater than expected for age. These signal changes are nonspecific and differential considerations include accelerated chronic small vessel ischemic disease, sequelae of chronic migraine headaches, sequelae of vasculitis/hypercoagulable state and sequelae of a prior infectious/inflammatory process, among others.   MRA head:   Significantly motion degraded examination. No proximal intracranial large vessel occlusion is identified. Motion artifact precludes accurate assessment for stenoses and for vessel irregularity.    COGNITION: Overall cognitive status: Within functional limits for tasks assessed   SENSATION: Light touch:  Impaired ; below R knee has decreased sensation   COORDINATION: Heel shin: tonic shaking movements    MUSCLE TONE: LLE: Moderate RLE Moderate   POSTURE: forward head, increased thoracic kyphosis, posterior pelvic tilt, and flexed trunk     LOWER EXTREMITY MMT:    MMT Right Eval Left Eval  Hip flexion 6.2 5.6  Hip extension    Hip abduction 6.5 6.4  Hip adduction 3.4 3.4  Hip internal rotation    Hip external rotation    Knee flexion 5.1 1.0  Knee extension 7.1 3.2  Ankle dorsiflexion    Ankle plantarflexion    Ankle inversion    Ankle eversion    (Blank rows = not tested)  BED MOBILITY:  Will assess in future session.  TRANSFERS: Assistive device utilized: Environmental consultant - 4 wheeled  Sit to stand: CGA Stand to sit: CGA Chair to chair: Min A     GAIT: Gait pattern: step to pattern, knee flexed in stance- Right, knee flexed in stance- Left, ataxic, trunk flexed, poor foot clearance- Right, and poor foot clearance- Left Distance walked: 8 ft  Assistive device utilized: Walker - 4 wheeled Level of assistance: CGA and Min A Comments: Jerking tonic  gait pattern   FUNCTIONAL TESTS:  5 times sit to stand: 46.33 with hands  10 meter walk test: 1.04 for 8 ft; unable to finish  PATIENT SURVEYS:  Stroke Impact Scale give next session                                                                                                                               TREATMENT DATE: 07/04/23   TherAct: In // bars: -ambulate length of // bar 2x length of // bars ; seated rest break after one length -Static stand and alternate tapping PT shoulder for stabilization standing training -march 10x each LE -lateral step 2x length of // bars; seated rest break after one length.  -tap the hedghog with alternating feet for coordination and spatial awareness; increased trembling with effort.   TherEx  Seated: Squash the hedgheog for isometric press 10x each LE Adduction ball squeeze  10x Adduction ball squeeze with heel raise 15x  PATIENT EDUCATION: Education details: goals, POC  Person educated: Patient Education method: Explanation, Demonstration, Tactile cues, and Verbal cues Education comprehension: verbalized understanding, returned demonstration, verbal cues required, and tactile cues required  HOME EXERCISE PROGRAM: Access Code: [email or web address] URL: [email or web address] Date: 06/25/2023 Prepared by: Precious Bard  Exercises - Leg Extension  - 1 x daily - 7 x weekly - 2 sets - 10 reps - 5 hold - Seated March  - 1 x daily - 7 x weekly - 2 sets - 10 reps - 5 hold - Seated Hip Adduction Isometrics with Ball  - 1 x daily - 7 x weekly - 2 sets - 10 reps - 5 hold - Seated Heel Toe Raises  - 1 x daily - 7 x weekly - 2 sets - 10 reps - 5 hold  GOALS: Goals reviewed with patient? Yes  SHORT TERM GOALS: Target date: 07/23/2023    Patient will be independent in home exercise program to improve strength/mobility for better functional independence with ADLs. Baseline: 3/10: give next session Goal status: INITIAL    LONG TERM GOALS: Target date: 09/17/2023  Patient (< 76 years old) will complete five times sit to stand test in < 10 seconds indicating an increased LE strength and improved balance. Baseline: 3/10: 46.33 Goal status: INITIAL  2.  Patient will perform 10 MWT under 2 minutes with no rest breaks indicating improved functional ambulation. Baseline: 3/10: 8 ft in 1 min 4 seconds Goal status: INITIAL  3.  Patient will tolerate standing 3 minutes for ADL performance Baseline: 2/95: knee buckling in standing Goal status: INITIAL  4.  Patient will increase BLE gross strength to 4+/5 and/or within 1lb of each limb  as to improve functional strength for independent gait, increased standing tolerance and increased ADL ability. Baseline: 3/10: see above  Goal status: INITIAL    ASSESSMENT:  CLINICAL IMPRESSION: Patient tolerates increased  standing time this session with rest breaks. She has increased trembling, buckling of legs, and tone with fatigue and effortful movement. She remains highly motivated throughout session despite fatigue by end of session.  Patient will benefit from skilled physical therapy to increase functional mobility,  strength, and independence with ADLs.   OBJECTIVE IMPAIRMENTS: Abnormal gait, decreased activity tolerance, decreased balance, decreased coordination, decreased endurance, decreased mobility, difficulty walking, decreased strength, dizziness, impaired perceived functional ability, increased muscle spasms, impaired flexibility, improper body mechanics, and postural dysfunction.   ACTIVITY LIMITATIONS: carrying, lifting, bending, sitting, standing, squatting, sleeping, stairs, transfers, bed mobility, bathing, toileting, dressing, reach over head, hygiene/grooming, locomotion level, and caring for others  PARTICIPATION LIMITATIONS: meal prep, cleaning, laundry, interpersonal relationship, driving, shopping, community activity, yard work, and church  PERSONAL FACTORS: Age, Past/current experiences, Time since onset of injury/illness/exacerbation, Transportation, and 3+ comorbidities: adrenal insufficiency, Hereditary spastic paraplegia (HSP), small fiber neuropathy c/b BLE paraplegia and neurogenic bladder s/p cystectomy w/ ileal condiuit (05/2022), chronic pain, migraine  are also affecting patient's functional outcome.   REHAB POTENTIAL: Good  CLINICAL DECISION MAKING: Evolving/moderate complexity  EVALUATION COMPLEXITY: Moderate  PLAN:  PT FREQUENCY: 2x/week  PT DURATION: 12 weeks  PLANNED INTERVENTIONS: 97164- PT Re-evaluation, 97110-Therapeutic exercises, 97530- Therapeutic activity, [id number]- Neuromuscular re-education, 97535- Self Care, 28413- Manual therapy, [id number]- Gait training, [phone]- Orthotic Fit/training, [phone]- Canalith repositioning, [id number]- Splinting, D6644- Electrical stimulation  (unattended), [phone]- Electrical stimulation (manual), [id number]- Vasopneumatic device, [id number]- Ultrasound, [id number]- Traction (mechanical), [id number]- Ionotophoresis 4mg /ml Dexamethasone, Patient/Family education, Balance training, Stair training, Taping, Dry Needling, Joint mobilization, Joint manipulation, Spinal manipulation, Spinal mobilization, Scar mobilization, Compression bandaging, Vestibular training, Visual/preceptual remediation/compensation, DME instructions, Wheelchair mobility training, Cryotherapy, Moist heat, and Biofeedback  PLAN FOR NEXT SESSION: strengthening, balance    Precious Bard, PT 07/04/2023, 12:12 PM

## 2023-07-04 ENCOUNTER — Ambulatory Visit: Payer: BC Managed Care – PPO

## 2023-07-04 ENCOUNTER — Ambulatory Visit: Admitting: Occupational Therapy

## 2023-07-04 DIAGNOSIS — M25552 Pain in left hip: Secondary | ICD-10-CM

## 2023-07-04 DIAGNOSIS — M6281 Muscle weakness (generalized): Secondary | ICD-10-CM

## 2023-07-04 DIAGNOSIS — R2681 Unsteadiness on feet: Secondary | ICD-10-CM

## 2023-07-04 DIAGNOSIS — R278 Other lack of coordination: Secondary | ICD-10-CM

## 2023-07-04 DIAGNOSIS — R262 Difficulty in walking, not elsewhere classified: Secondary | ICD-10-CM

## 2023-07-04 NOTE — Therapy (Addendum)
 OUTPATIENT OCCUPATIONAL THERAPY NEURO TREATMENT NOTE  Patient Name: Christine Chavez MRN: [id number] DOB:23-Aug-1980, 43 y.o., female Today's Date: 07/04/2023  PCP: Ardyth Man, PA-C REFERRING PROVIDER: Lonell Face, MD  END OF SESSION:  OT End of Session - 07/04/23 1438     Visit Number 2    Number of Visits 24    Date for OT Re-Evaluation 09/19/23    OT Start Time 1145    OT Stop Time 1230    OT Time Calculation (min) 45 min    Activity Tolerance Patient tolerated treatment well    Behavior During Therapy WFL for tasks assessed/performed             Past Medical History:  Diagnosis Date   Bladder retention    Complication of anesthesia    ? seizures after anesthesia    Depression    Diarrhea due to malabsorption    Dizziness    Headache    Migraines    Neurogenic bladder    Pyelonephritis    Renal disorder    Vision abnormalities    Weight loss    Past Surgical History:  Procedure Laterality Date   ANTERIOR CRUCIATE LIGAMENT REPAIR  1997   APPENDECTOMY     BLADDER REMOVAL     COLONOSCOPY WITH PROPOFOL N/A 11/11/2018   Procedure: COLONOSCOPY WITH PROPOFOL;  Surgeon: Christena Deem, MD;  Location: [facility] ENDOSCOPY;  Service: Endoscopy;  Laterality: N/A;   CYSTOSCOPY WITH STENT PLACEMENT Right 04/17/2016   Procedure: CYSTOSCOPY WITH STENT PLACEMENT;  Surgeon: Malen Gauze, MD;  Location: [facility] ORS;  Service: Urology;  Laterality: Right;   ESOPHAGOGASTRODUODENOSCOPY (EGD) WITH PROPOFOL N/A 11/11/2018   Procedure: ESOPHAGOGASTRODUODENOSCOPY (EGD) WITH PROPOFOL;  Surgeon: Christena Deem, MD;  Location: [facility] ENDOSCOPY;  Service: Endoscopy;  Laterality: N/A;   EXPLORATORY LAPAROTOMY  1999   HIP SURGERY Right    KIDNEY STONE SURGERY  04/2016   REVISION UROSTOMY CUTANEOUS     REVISION UROSTOMY CUTANEOUS  01/10/2018   SUPRAPUBIC CATHETER PLACEMENT  08/2017   TONSILLECTOMY     Patient Active Problem List   Diagnosis Date Noted   Stroke-like  symptoms 06/13/2023   Refractory nausea and vomiting 02/22/2023   Syncope and collapse 02/19/2023   Palliative care by specialist 02/19/2023   UTI (urinary tract infection) 02/13/2023   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   Neurogenic bladder 02/13/2023   Hepatic steatosis 02/13/2023   Wheelchair dependent 02/13/2023   Secondary adrenal insufficiency (HCC) 02/13/2023   Right flank pain 02/13/2023   Iron deficiency anemia 10/02/2022   Seizure (HCC) 11/11/2018   Major depressive disorder, recurrent episode, moderate (HCC) 02/07/2018   Nephrolithiasis 04/16/2016   Numbness 07/28/2015   Bladder retention 06/23/2015   Abdominal pain 06/04/2015   Dizziness 05/18/2015   Neck pain 05/18/2015   Complicated migraine 04/28/2015   Other fatigue 04/28/2015   Abnormal finding on MRI of brain 04/28/2015   Diarrhea 03/29/2015   H/O disease 03/29/2015   Abnormal weight loss 03/29/2015   Muscle weakness (generalized) 03/14/2015   Headache, migraine 03/10/2015    ONSET DATE: 06/13/23  REFERRING DIAG: CVA, HSP  THERAPY DIAG:  Muscle weakness (generalized)  Other lack of coordination  Rationale for Evaluation and Treatment: Rehabilitation  SUBJECTIVE:   SUBJECTIVE STATEMENT: Pt. reports having had a couple of bad nights with neuropathic pain in her UEs, and hips this week.  Pt accompanied by: significant other Husband: Paul  PERTINENT HISTORY:   Pt. is a 43 y.o.  female who was hospitalized with a CVA from 06/13/23-06/14/23.  Imaging revealed two small acute cortical infarcts within the posterior Right frontal lobe (with involvement in the motor strip). PMHx includes: HSP With a  recent hospitalization from January 13-February 19th at [facility] 2/2 a Neuro Flare from the HSP. Hx of small fiber neuropathy d/t BLE paraplegia, Neurogenic bladder s/p cystectomy with ideal conduit (2/24) ,chronic pain, migraine. Pt. Reports multiple hospitalizations of the past year.  PRECAUTIONS:  None  WEIGHT BEARING RESTRICTIONS: No  PAIN:  Are you having pain?   FALLS: Has patient fallen in last 6 months? Yes. Number of falls 3  LIVING ENVIRONMENT: Lives with: lives with their family Lives in: House/apartment, One level Stairs:  ramp. Has following equipment at home: Dan Humphreys - 4 wheeled, Wheelchair (power), Wheelchair (manual), Shower bench, and bed side commode, bed rail  PLOF: Independent  PATIENT GOALS:  Regain strength, and hand coordination  OBJECTIVE:  Note: Objective measures were completed at Evaluation unless otherwise noted.  HAND DOMINANCE: Right  ADLs: Overall ADLs:  Transfers/ambulation related to ADLs: Eating:  Independent, Cutting. Difficulty opening  Grooming: Independent UB Dressing: Independent LB Dressing: Independent, set-up Toileting:  Independent, Ostomy Bag Bathing: Independent once in the shower            Shower transfers: Husband assists with shower transfers.   IADLs: Shopping: Orders online. Husband picks them up Light housekeeping: Family assists Meal Prep: Engineer, structural, Assist with meals, cutting Community mobility: Relies on family, and friends Medication management: Independent Financial management: No change Handwriting: 100% legible, Pain/fatigues with writing note cards. Hobbies: Making gifts baskets; shopping ([employer] is a favorite)  MOBILITY STATUS: Uses a w/c  POSTURE COMMENTS:   Sitting balance:  WFL  ACTIVITY TOLERANCE: Activity tolerance: limited  FUNCTIONAL OUTCOME MEASURES: MAM-20: Sum score: 73/80; MAM Measure score: 71.8  UPPER EXTREMITY ROM:    Active ROM Right Eval WFL Left Eval [facility]  Shoulder flexion    Shoulder abduction    Shoulder adduction    Shoulder extension    Shoulder internal rotation    Shoulder external rotation    Elbow flexion    Elbow extension    Wrist flexion    Wrist extension    Wrist ulnar deviation    Wrist radial deviation    Wrist pronation    Wrist supination     (Blank rows = not tested)  UPPER EXTREMITY MMT:     MMT Right eval Left eval  Shoulder flexion 3+/5 3+/5  Shoulder abduction 3+/5 3+/5  Shoulder adduction    Shoulder extension    Shoulder internal rotation    Shoulder external rotation    Middle trapezius    Lower trapezius    Elbow flexion 3+/5 3+/5  Elbow extension 3+/5 3+/5  Wrist flexion    Wrist extension 3+/5 3+/5  Wrist ulnar deviation    Wrist radial deviation    Wrist pronation 3+/5 3+/5  Wrist supination 3+/5 3+/5  (Blank rows = not tested)  HAND FUNCTION: Grip strength: Right: 5 lbs; Left: 2 lbs and Lateral pinch: Right: 5 lbs, Left: 2 lbs, 3pt. Pinch: R: 5 lbs, L: 2 lbs; 2pt. Pinch: R: 1 lbs, L: 1lb  COORDINATION: 9 Hole Peg test: Right: 25 sec; Left: 31 sec  SENSATION: WFL light touch, and proprioceptive awareness  EDEMA: N/A   COGNITION: Overall cognitive status: Within functional limits for tasks assessed  VISION: Subjective report: Reports blurriness  PERCEPTION: WFL  PRAXIS: WFL  TREATMENT DATE: 07/04/2023   Therapeutic Ex.:   -Yellow Theraputty hand strengthening exercises including: gross gripping, gross digit extension, lateral, and 3pt. pinch strengthening, thumb opposition, bilateral alternating twisting motion in opposite directions with supination/pronation, rolling circular spheres within the tips of the fingers, and manipulating putty within the tips of fingers to remove small objects. Pt. was provided with a visual handout HEP through [employer] with a video access code. -Progressive gross grip strengthening with 6.6# of grip strength resistive force. -Incorporated reaching in multiple planes to further challenge the task     PATIENT EDUCATION: Education details: OT services, POC, goals, Pt. Functional status Person educated: Patient and Spouse Education  method: Demonstration and Verbal cues,verbal cues Education comprehension: verbalized understanding and needs further education  HOME EXERCISE PROGRAM:  Continue ongoing assessment, and provide as indicated   GOALS: Goals reviewed with patient? Yes  SHORT TERM GOALS: Target date: 08/08/2023    Pt. Will be independent with HEPs for BUEs. Baseline:No current HEP Goal status: INITIAL   LONG TERM GOALS: Target date: 09/19/2023    Pt. Will be improve BUE strength by 2 mm grades to be able to independently reach for ADL/IADL items in multiple planes. Baseline: Eval: BUE strength: 3+/5 overall  Goal status: INITIAL  2.  Pt. will improve bilateral grip strength to be able to securely hold items. Baseline: Eval: R: 5#, L: 2# Goal status: INITIAL  3.  Pt. Will improve bilateral lateral pinch strength by 3#  to be able to open medication bottles Baseline: Eval: R: 5#, L: 2# Goal status: INITIAL  4.  Pt. Will improve bilateral 3pt. pinch strength to be able to independently use a nail clipper Baseline: Eval: R: 3#, L: 2# Goal status: INITIAL  5.  Pt. Will will improve lef [facility] skills to be able to manipulate small objects. Baseline: Eval: R: 25 sec., L: 31 sec. Goal status: INITIAL  ASSESSMENT:  CLINICAL IMPRESSION:  Pt. reports having had interrupted sleep from bad neuropathic pain in her arms, and hips this week. Pt. tolerated the hand exercises with the theraputty, and the bilateral gross grip strengthening exercises well. Pt. was able to perform translatory movements moving the jumbo pegs through her hand from the palm to the tip of her 2nd digit, and thumb in preparation for placing them onto the pegboard.  Pt. will benefit from OT services to work on improving BUE functioning in order to improve engagement in, and participation in ADL, and IADL functioning.  PERFORMANCE DEFICITS: in functional skills including ADLs, IADLs, coordination, dexterity, proprioception, ROM, strength,  Fine motor control, Gross motor control, and UE functional use, cognitive skills including , and psychosocial skills including environmental adaptation, routines, and behaviors.   IMPAIRMENTS: are limiting patient from ADLs, IADLs, and leisure.   CO-MORBIDITIES: may have co-morbidities  that affects occupational performance. Patient will benefit from skilled OT to address above impairments and improve overall function.  MODIFICATION OR ASSISTANCE TO COMPLETE EVALUATION: Min-Moderate modification of tasks or assist with assess necessary to complete an evaluation.  OT OCCUPATIONAL PROFILE AND HISTORY: Detailed assessment: Review of records and additional review of physical, cognitive, psychosocial history related to current functional performance.  CLINICAL DECISION MAKING: Moderate - several treatment options, min-mod task modification necessary  REHAB POTENTIAL: Good  EVALUATION COMPLEXITY: Moderate    PLAN:  OT FREQUENCY: 2x/week  OT DURATION: 12 weeks  PLANNED INTERVENTIONS: 97168 OT Re-evaluation, 97535 self care/ADL training, 16109 therapeutic exercise, 97530 therapeutic activity, 97112 neuromuscular re-education, 97018 paraffin, 60454 contrast  bath, [phone] Orthotics management and training, 91478 Splinting (initial encounter), functional mobility training, patient/family education, and DME and/or AE instructions  RECOMMENDED OTHER SERVICES: PT  CONSULTED AND AGREED WITH PLAN OF CARE: Patient and family member/caregiver  PLAN FOR NEXT SESSION: Treatment   Olegario Messier, MS, OTR/L  07/04/2023, 2:44 PM

## 2023-07-05 ENCOUNTER — Ambulatory Visit: Payer: BC Managed Care – PPO

## 2023-07-09 ENCOUNTER — Ambulatory Visit: Payer: BC Managed Care – PPO

## 2023-07-10 NOTE — Addendum Note (Signed)
 Encounter addended by: Bryna Colander, RN on: 07/10/2023 8:24 AM  Actions taken: Imaging Exam ended

## 2023-07-11 ENCOUNTER — Ambulatory Visit: Payer: BC Managed Care – PPO

## 2023-07-11 ENCOUNTER — Encounter: Admitting: Occupational Therapy

## 2023-07-16 ENCOUNTER — Ambulatory Visit: Payer: BC Managed Care – PPO

## 2023-07-16 ENCOUNTER — Ambulatory Visit

## 2023-07-18 ENCOUNTER — Ambulatory Visit: Admitting: Occupational Therapy

## 2023-07-18 ENCOUNTER — Ambulatory Visit: Payer: BC Managed Care – PPO

## 2023-07-18 DIAGNOSIS — R55 Syncope and collapse: Secondary | ICD-10-CM | POA: Diagnosis not present

## 2023-07-18 DIAGNOSIS — R2990 Unspecified symptoms and signs involving the nervous system: Secondary | ICD-10-CM

## 2023-07-18 NOTE — Progress Notes (Signed)
 Average heart rate of 80 bpm, 1 episode of elevated heart rate longest lasting 4 beats, no atrial fibrillation no atrial flutter no sustained arrhythmias.  No findings to suggest syncope with collapse.

## 2023-07-19 NOTE — Therapy (Signed)
 OUTPATIENT PHYSICAL THERAPY NEURO TREATMENT   Patient Name: Christine Chavez MRN: [id number] DOB:26-Nov-1980, 43 y.o., female Today's Date: 07/23/2023   PCP: Ardyth Man REFERRING PROVIDER: Cristopher Peru  END OF SESSION:  PT End of Session - 07/23/23 1058     Visit Number 4    Number of Visits 24    Date for PT Re-Evaluation 09/17/23    PT Start Time 1100    PT Stop Time 1140    PT Time Calculation (min) 40 min    Equipment Utilized During Treatment Gait belt    Activity Tolerance Patient tolerated treatment well;Patient limited by fatigue    Behavior During Therapy WFL for tasks assessed/performed                Past Medical History:  Diagnosis Date   Bladder retention    Complication of anesthesia    ? seizures after anesthesia    Depression    Diarrhea due to malabsorption    Dizziness    Headache    Migraines    Neurogenic bladder    Pyelonephritis    Renal disorder    Vision abnormalities    Weight loss    Past Surgical History:  Procedure Laterality Date   ANTERIOR CRUCIATE LIGAMENT REPAIR  1997   APPENDECTOMY     BLADDER REMOVAL     COLONOSCOPY WITH PROPOFOL N/A 11/11/2018   Procedure: COLONOSCOPY WITH PROPOFOL;  Surgeon: Christena Deem, MD;  Location: [facility] ENDOSCOPY;  Service: Endoscopy;  Laterality: N/A;   CYSTOSCOPY WITH STENT PLACEMENT Right 04/17/2016   Procedure: CYSTOSCOPY WITH STENT PLACEMENT;  Surgeon: Malen Gauze, MD;  Location: [facility] ORS;  Service: Urology;  Laterality: Right;   ESOPHAGOGASTRODUODENOSCOPY (EGD) WITH PROPOFOL N/A 11/11/2018   Procedure: ESOPHAGOGASTRODUODENOSCOPY (EGD) WITH PROPOFOL;  Surgeon: Christena Deem, MD;  Location: [facility] ENDOSCOPY;  Service: Endoscopy;  Laterality: N/A;   EXPLORATORY LAPAROTOMY  1999   HIP SURGERY Right    KIDNEY STONE SURGERY  04/2016   REVISION UROSTOMY CUTANEOUS     REVISION UROSTOMY CUTANEOUS  01/10/2018   SUPRAPUBIC CATHETER PLACEMENT  08/2017   TONSILLECTOMY      Patient Active Problem List   Diagnosis Date Noted   Stroke-like symptoms 06/13/2023   Refractory nausea and vomiting 02/22/2023   Syncope and collapse 02/19/2023   Palliative care by specialist 02/19/2023   UTI (urinary tract infection) 02/13/2023   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   Neurogenic bladder 02/13/2023   Hepatic steatosis 02/13/2023   Wheelchair dependent 02/13/2023   Secondary adrenal insufficiency (HCC) 02/13/2023   Right flank pain 02/13/2023   Iron deficiency anemia 10/02/2022   Seizure (HCC) 11/11/2018   Major depressive disorder, recurrent episode, moderate (HCC) 02/07/2018   Nephrolithiasis 04/16/2016   Numbness 07/28/2015   Bladder retention 06/23/2015   Abdominal pain 06/04/2015   Dizziness 05/18/2015   Neck pain 05/18/2015   Complicated migraine 04/28/2015   Other fatigue 04/28/2015   Abnormal finding on MRI of brain 04/28/2015   Diarrhea 03/29/2015   H/O disease 03/29/2015   Abnormal weight loss 03/29/2015   Muscle weakness (generalized) 03/14/2015   Headache, migraine 03/10/2015    ONSET DATE: 06/13/23  REFERRING DIAG: CVA  THERAPY DIAG:  Muscle weakness (generalized)  Unsteadiness on feet  Difficulty in walking, not elsewhere classified  Rationale for Evaluation and Treatment: Rehabilitation  SUBJECTIVE:  SUBJECTIVE STATEMENT: Patient had to go to the ED for significant pain since she was seen last. Has had some medical complications.  Pt accompanied by: significant other  PERTINENT HISTORY:  Patient is referred to PT s/p CVA.  Patient presents to PT s/p CVA on 06/13/23.  Pmh includes adrenal insufficiency, Hereditary spastic paraplegia (HSP), small fiber neuropathy c/b BLE paraplegia and neurogenic bladder s/p cystectomy w/ ileal condiuit  (05/2022), chronic pain, migraine. Patient was recently hospitalized prior to this at [facility] from 04/30/23 to 05/27/23. Patient reports L sided weakness. Is having stabbing pains for a few minutes.   PAIN:  Are you having pain?  Has been having sharp stabbing pains  PRECAUTIONS: Fall  RED FLAGS: None and hx of bladder removal      WEIGHT BEARING RESTRICTIONS: No  FALLS: Has patient fallen in last 6 months? Yes. Number of falls 3  LIVING ENVIRONMENT: Lives with: lives with their family and lives with their spouse Lives in: House/apartment Stairs:  ramp Has following equipment at home: Wheelchair (manual) and Ramped entry  PLOF: Independent with household mobility with device  PATIENT GOALS: to get stronger, do ADLs better.   OBJECTIVE:  Note: Objective measures were completed at Evaluation unless otherwise noted.  DIAGNOSTIC FINDINGS:  MRI results received from [facility] "1. 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 2. Adjacent small acute infarct within the posterior right frontal lobe white matter. 3. Background moderate multifocal T2 FLAIR hyperintense signal abnormality within the cerebral white matter (with a subcortical white matter predominance), greater than expected for age. These signal changes are nonspecific and differential considerations include accelerated chronic small vessel ischemic disease, sequelae of chronic migraine headaches, sequelae of vasculitis/hypercoagulable state and sequelae of a prior infectious/inflammatory process, among others.   MRA head:   Significantly motion degraded examination. No proximal intracranial large vessel occlusion is identified. Motion artifact precludes accurate assessment for stenoses and for vessel irregularity.    COGNITION: Overall cognitive status: Within functional limits for tasks assessed   SENSATION: Light touch:  Impaired ; below R knee has decreased sensation   COORDINATION: Heel shin: tonic shaking movements    MUSCLE TONE: LLE: Moderate RLE Moderate   POSTURE: forward head, increased thoracic kyphosis, posterior pelvic tilt, and flexed trunk     LOWER EXTREMITY MMT:    MMT Right Eval Left Eval  Hip flexion 6.2 5.6  Hip extension    Hip abduction 6.5 6.4  Hip adduction 3.4 3.4  Hip internal rotation    Hip external rotation    Knee flexion 5.1 1.0  Knee extension 7.1 3.2  Ankle dorsiflexion    Ankle plantarflexion    Ankle inversion    Ankle eversion    (Blank rows = not tested)  BED MOBILITY:  Will assess in future session.  TRANSFERS: Assistive device utilized: Environmental consultant - 4 wheeled  Sit to stand: CGA Stand to sit: CGA Chair to chair: Min A     GAIT: Gait pattern: step to pattern, knee flexed in stance- Right, knee flexed in stance- Left, ataxic, trunk flexed, poor foot clearance- Right, and poor foot clearance- Left Distance walked: 8 ft  Assistive device utilized: Walker - 4 wheeled Level of assistance: CGA and Min A Comments: Jerking tonic  gait pattern   FUNCTIONAL TESTS:  5 times sit to stand: 46.33 with hands  10 meter walk test: 1.04 for 8 ft; unable to finish  PATIENT SURVEYS:  Stroke Impact Scale give next session                                                                                                                               TREATMENT DATE: 07/23/23   TherAct: Lateral step at ends of plinth table 4x Standing weight shifts 10x Sit to stand x multiple attempts  TherEx Supine: Hamstring stretch 60 seconds each LE Single knee to chest 60 seconds each LE Figure four position 60 seconds each LE BTB abduction 10x; 2 sets BTB march 10x; 2 sets Posterior pelvic tilt 10x  Seated: LAQ 10x BTB hamstring curl 15x each LE; 2 sets BTB abduction 10x; 2 sets BTB PF 10x; 2 sets   PATIENT EDUCATION: Education details: goals, POC  Person  educated: Patient Education method: Explanation, Demonstration, Tactile cues, and Verbal cues Education comprehension: verbalized understanding, returned demonstration, verbal cues required, and tactile cues required  HOME EXERCISE PROGRAM: Access Code: [email or web address] URL: [email or web address] Date: 06/25/2023 Prepared by: Precious Bard  Exercises - Leg Extension  - 1 x daily - 7 x weekly - 2 sets - 10 reps - 5 hold - Seated March  - 1 x daily - 7 x weekly - 2 sets - 10 reps - 5 hold - Seated Hip Adduction Isometrics with Ball  - 1 x daily - 7 x weekly - 2 sets - 10 reps - 5 hold - Seated Heel Toe Raises  - 1 x daily - 7 x weekly - 2 sets - 10 reps - 5 hold  GOALS: Goals reviewed with patient? Yes  SHORT TERM GOALS: Target date: 07/23/2023    Patient will be independent in home exercise program to improve strength/mobility for better functional independence with ADLs. Baseline: 3/10: give next session Goal status: INITIAL    LONG TERM GOALS: Target date: 09/17/2023  Patient (< 58 years old) will complete five times sit to stand test in < 10 seconds indicating an increased LE strength and improved balance. Baseline: 3/10: 46.33 Goal status: INITIAL  2.  Patient will perform 10 MWT under 2 minutes with no rest breaks indicating improved functional ambulation. Baseline: 3/10: 8 ft in 1 min 4 seconds Goal status: INITIAL  3.  Patient will tolerate standing 3 minutes for ADL performance Baseline: 9/14: knee buckling in standing Goal status: INITIAL  4.  Patient will increase BLE gross strength to 4+/5 and/or within 1lb of each limb  as to improve functional strength for independent gait, increased standing tolerance and increased ADL ability. Baseline: 3/10: see above  Goal status: INITIAL    ASSESSMENT:  CLINICAL IMPRESSION: Patient tolerates strengthening interventions in low impact positions due to recent medical complications. She remains highly motivated for  progression of care. No increase in pain reported by end of session.  Patient will benefit from skilled physical therapy to increase functional mobility, strength, and independence with ADLs.   OBJECTIVE IMPAIRMENTS: Abnormal gait, decreased activity tolerance, decreased balance,  decreased coordination, decreased endurance, decreased mobility, difficulty walking, decreased strength, dizziness, impaired perceived functional ability, increased muscle spasms, impaired flexibility, improper body mechanics, and postural dysfunction.   ACTIVITY LIMITATIONS: carrying, lifting, bending, sitting, standing, squatting, sleeping, stairs, transfers, bed mobility, bathing, toileting, dressing, reach over head, hygiene/grooming, locomotion level, and caring for others  PARTICIPATION LIMITATIONS: meal prep, cleaning, laundry, interpersonal relationship, driving, shopping, community activity, yard work, and church  PERSONAL FACTORS: Age, Past/current experiences, Time since onset of injury/illness/exacerbation, Transportation, and 3+ comorbidities: adrenal insufficiency, Hereditary spastic paraplegia (HSP), small fiber neuropathy c/b BLE paraplegia and neurogenic bladder s/p cystectomy w/ ileal condiuit (05/2022), chronic pain, migraine  are also affecting patient's functional outcome.   REHAB POTENTIAL: Good  CLINICAL DECISION MAKING: Evolving/moderate complexity  EVALUATION COMPLEXITY: Moderate  PLAN:  PT FREQUENCY: 2x/week  PT DURATION: 12 weeks  PLANNED INTERVENTIONS: 97164- PT Re-evaluation, 97110-Therapeutic exercises, 97530- Therapeutic activity, 97112- Neuromuscular re-education, 97535- Self Care, 16109- Manual therapy, [phone]- Gait training, [phone]- Orthotic Fit/training, [phone]- Canalith repositioning, [id number]- Splinting, G9562- Electrical stimulation (unattended), [phone]- Electrical stimulation (manual), [id number]- Vasopneumatic device, [id number]- Ultrasound, [id number]- Traction (mechanical), [id number]- Ionotophoresis  4mg /ml Dexamethasone, Patient/Family education, Balance training, Stair training, Taping, Dry Needling, Joint mobilization, Joint manipulation, Spinal manipulation, Spinal mobilization, Scar mobilization, Compression bandaging, Vestibular training, Visual/preceptual remediation/compensation, DME instructions, Wheelchair mobility training, Cryotherapy, Moist heat, and Biofeedback  PLAN FOR NEXT SESSION: strengthening, balance    Precious Bard, PT 07/23/2023, 12:39 PM

## 2023-07-20 ENCOUNTER — Encounter: Payer: Self-pay | Admitting: *Deleted

## 2023-07-23 ENCOUNTER — Ambulatory Visit: Admitting: Cardiovascular Disease

## 2023-07-23 ENCOUNTER — Ambulatory Visit: Admitting: Occupational Therapy

## 2023-07-23 ENCOUNTER — Ambulatory Visit: Payer: BC Managed Care – PPO | Attending: Neurology

## 2023-07-23 DIAGNOSIS — M25552 Pain in left hip: Secondary | ICD-10-CM | POA: Diagnosis present

## 2023-07-23 DIAGNOSIS — M6281 Muscle weakness (generalized): Secondary | ICD-10-CM | POA: Insufficient documentation

## 2023-07-23 DIAGNOSIS — R262 Difficulty in walking, not elsewhere classified: Secondary | ICD-10-CM | POA: Insufficient documentation

## 2023-07-23 DIAGNOSIS — R278 Other lack of coordination: Secondary | ICD-10-CM | POA: Insufficient documentation

## 2023-07-23 DIAGNOSIS — R2681 Unsteadiness on feet: Secondary | ICD-10-CM | POA: Diagnosis present

## 2023-07-23 DIAGNOSIS — M25551 Pain in right hip: Secondary | ICD-10-CM | POA: Diagnosis present

## 2023-07-23 DIAGNOSIS — M62838 Other muscle spasm: Secondary | ICD-10-CM | POA: Insufficient documentation

## 2023-07-23 NOTE — Therapy (Signed)
 OUTPATIENT OCCUPATIONAL THERAPY NEURO TREATMENT NOTE  Patient Name: Christine Chavez MRN: [id number] DOB:[birth year], 43 y.o., female Today's Date: 07/23/2023  PCP: Ardyth Man, PA-C REFERRING PROVIDER: Lonell Face, MD  END OF SESSION:  OT End of Session - 07/23/23 1320     Visit Number 3    Date for OT Re-Evaluation 09/19/23    OT Start Time 1145    OT Stop Time 1230    OT Time Calculation (min) 45 min    Activity Tolerance Patient tolerated treatment well    Behavior During Therapy WFL for tasks assessed/performed             Past Medical History:  Diagnosis Date   Bladder retention    Complication of anesthesia    ? seizures after anesthesia    Depression    Diarrhea due to malabsorption    Dizziness    Headache    Migraines    Neurogenic bladder    Pyelonephritis    Renal disorder    Vision abnormalities    Weight loss    Past Surgical History:  Procedure Laterality Date   ANTERIOR CRUCIATE LIGAMENT REPAIR  1997   APPENDECTOMY     BLADDER REMOVAL     COLONOSCOPY WITH PROPOFOL N/A 11/11/2018   Procedure: COLONOSCOPY WITH PROPOFOL;  Surgeon: Christena Deem, MD;  Location: [facility] ENDOSCOPY;  Service: Endoscopy;  Laterality: N/A;   CYSTOSCOPY WITH STENT PLACEMENT Right 04/17/2016   Procedure: CYSTOSCOPY WITH STENT PLACEMENT;  Surgeon: Malen Gauze, MD;  Location: [facility] ORS;  Service: Urology;  Laterality: Right;   ESOPHAGOGASTRODUODENOSCOPY (EGD) WITH PROPOFOL N/A 11/11/2018   Procedure: ESOPHAGOGASTRODUODENOSCOPY (EGD) WITH PROPOFOL;  Surgeon: Christena Deem, MD;  Location: [facility] ENDOSCOPY;  Service: Endoscopy;  Laterality: N/A;   EXPLORATORY LAPAROTOMY  1999   HIP SURGERY Right    KIDNEY STONE SURGERY  04/2016   REVISION UROSTOMY CUTANEOUS     REVISION UROSTOMY CUTANEOUS  01/10/2018   SUPRAPUBIC CATHETER PLACEMENT  08/2017   TONSILLECTOMY     Patient Active Problem List   Diagnosis Date Noted   Stroke-like symptoms 06/13/2023    Refractory nausea and vomiting 02/22/2023   Syncope and collapse 02/19/2023   Palliative care by specialist 02/19/2023   UTI (urinary tract infection) 02/13/2023   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   Neurogenic bladder 02/13/2023   Hepatic steatosis 02/13/2023   Wheelchair dependent 02/13/2023   Secondary adrenal insufficiency (HCC) 02/13/2023   Right flank pain 02/13/2023   Iron deficiency anemia 10/02/2022   Seizure (HCC) 11/11/2018   Major depressive disorder, recurrent episode, moderate (HCC) 02/07/2018   Nephrolithiasis 04/16/2016   Numbness 07/28/2015   Bladder retention 06/23/2015   Abdominal pain 06/04/2015   Dizziness 05/18/2015   Neck pain 05/18/2015   Complicated migraine 04/28/2015   Other fatigue 04/28/2015   Abnormal finding on MRI of brain 04/28/2015   Diarrhea 03/29/2015   H/O disease 03/29/2015   Abnormal weight loss 03/29/2015   Muscle weakness (generalized) 03/14/2015   Headache, migraine 03/10/2015    ONSET DATE: 06/13/23  REFERRING DIAG: CVA, HSP  THERAPY DIAG:  Muscle weakness (generalized)  Other lack of coordination  Rationale for Evaluation and Treatment: Rehabilitation  SUBJECTIVE:   SUBJECTIVE STATEMENT: Pt. reports feeling a bit better today. Pt accompanied by: significant other Husband: Paul  PERTINENT HISTORY:   Pt. is a 43 y.o. female who was hospitalized with a CVA from 06/13/23-06/14/23.  Imaging revealed two small acute cortical infarcts within the posterior  Right frontal lobe (with involvement in the motor strip). PMHx includes: HSP With a  recent hospitalization from January 13-February 19th at [facility] 2/2 a Neuro Flare from the HSP. Hx of small fiber neuropathy d/t BLE paraplegia, Neurogenic bladder s/p cystectomy with ideal conduit (2/24) ,chronic pain, migraine. Pt. Reports multiple hospitalizations of the past year.  PRECAUTIONS: None  WEIGHT BEARING RESTRICTIONS: No  PAIN:  Are you having pain?  Ovarian  discomfort, NR- recent ER visit for ovarian issues.   FALLS: Has patient fallen in last 6 months? Yes. Number of falls 3  LIVING ENVIRONMENT: Lives with: lives with their family Lives in: House/apartment, One level Stairs:  ramp. Has following equipment at home: Dan Humphreys - 4 wheeled, Wheelchair (power), Wheelchair (manual), Shower bench, and bed side commode, bed rail  PLOF: Independent  PATIENT GOALS:  Regain strength, and hand coordination  OBJECTIVE:  Note: Objective measures were completed at Evaluation unless otherwise noted.  HAND DOMINANCE: Right  ADLs: Overall ADLs:  Transfers/ambulation related to ADLs: Eating:  Independent, Cutting. Difficulty opening  Grooming: Independent UB Dressing: Independent LB Dressing: Independent, set-up Toileting:  Independent, Ostomy Bag Bathing: Independent once in the shower            Shower transfers: Husband assists with shower transfers.   IADLs: Shopping: Orders online. Husband picks them up Light housekeeping: Family assists Meal Prep: Engineer, structural, Assist with meals, cutting Community mobility: Relies on family, and friends Medication management: Independent Financial management: No change Handwriting: 100% legible, Pain/fatigues with writing note cards. Hobbies: Making gifts baskets; shopping ([employer] is a favorite)  MOBILITY STATUS: Uses a w/c  POSTURE COMMENTS:   Sitting balance:  WFL  ACTIVITY TOLERANCE: Activity tolerance: limited  FUNCTIONAL OUTCOME MEASURES: MAM-20: Sum score: 73/80; MAM Measure score: 71.8  UPPER EXTREMITY ROM:    Active ROM Right Eval WFL Left Eval [facility]  Shoulder flexion    Shoulder abduction    Shoulder adduction    Shoulder extension    Shoulder internal rotation    Shoulder external rotation    Elbow flexion    Elbow extension    Wrist flexion    Wrist extension    Wrist ulnar deviation    Wrist radial deviation    Wrist pronation    Wrist supination    (Blank rows = not  tested)  UPPER EXTREMITY MMT:     MMT Right eval Left eval  Shoulder flexion 3+/5 3+/5  Shoulder abduction 3+/5 3+/5  Shoulder adduction    Shoulder extension    Shoulder internal rotation    Shoulder external rotation    Middle trapezius    Lower trapezius    Elbow flexion 3+/5 3+/5  Elbow extension 3+/5 3+/5  Wrist flexion    Wrist extension 3+/5 3+/5  Wrist ulnar deviation    Wrist radial deviation    Wrist pronation 3+/5 3+/5  Wrist supination 3+/5 3+/5  (Blank rows = not tested)  HAND FUNCTION: Grip strength: Right: 5 lbs; Left: 2 lbs and Lateral pinch: Right: 5 lbs, Left: 2 lbs, 3pt. Pinch: R: 5 lbs, L: 2 lbs; 2pt. Pinch: R: 1 lbs, L: 1lb  COORDINATION: 9 Hole Peg test: Right: 25 sec; Left: 31 sec  SENSATION: WFL light touch, and proprioceptive awareness  EDEMA: N/A   COGNITION: Overall cognitive status: Within functional limits for tasks assessed  VISION: Subjective report: Reports blurriness  PERCEPTION: WFL  PRAXIS: WFL  TREATMENT DATE: 07/23/2023   Therapeutic Activities  -Progressive bilateral gross grip strengthening for 2 trials each with 11.9# of grip strength resistive force. -Pt. performed translatory movements moving the pegs through her hand from the palm to the tip of the 2nd digit, and thumb in preparation for placing them vertically into the pegboard. -Incorporated reaching in multiple planes to further challenge the task.  Therapeutic Ex:  -Lateral, and 3pt. Pinch strengthening using yellow, red, green, and blue, and black level resistive clips. Pt. worked on placing them onto a small horizontal dowel. -Bilateral digit flexion using the Digi-Flex, for single digit reps followed by alternating 2 digits at a time.  Neuromuscular re-education:   -facilitated [facility] tasks using the Grooved pegboard, grasping and 1" grooved  pegs from a horizontal position in the shallow dish, and transitioning them to a vertical position in preparation for placing them upright into the pegboard.     PATIENT EDUCATION: Education details: OT services, POC, goals, Pt. Functional status Person educated: Patient and Spouse Education method: Demonstration and Verbal cues,verbal cues Education comprehension: verbalized understanding and needs further education  HOME EXERCISE PROGRAM:  Continue ongoing assessment, and provide as indicated   GOALS: Goals reviewed with patient? Yes  SHORT TERM GOALS: Target date: 08/08/2023    Pt. Will be independent with HEPs for BUEs. Baseline:No current HEP Goal status: INITIAL   LONG TERM GOALS: Target date: 09/19/2023    Pt. Will be improve BUE strength by 2 mm grades to be able to independently reach for ADL/IADL items in multiple planes. Baseline: Eval: BUE strength: 3+/5 overall  Goal status: INITIAL  2.  Pt. will improve bilateral grip strength to be able to securely hold items. Baseline: Eval: R: 5#, L: 2# Goal status: INITIAL  3.  Pt. Will improve bilateral lateral pinch strength by 3#  to be able to open medication bottles Baseline: Eval: R: 5#, L: 2# Goal status: INITIAL  4.  Pt. Will improve bilateral 3pt. pinch strength to be able to independently use a nail clipper Baseline: Eval: R: 3#, L: 2# Goal status: INITIAL  5.  Pt. Will will improve lef [facility] skills to be able to manipulate small objects. Baseline: Eval: R: 25 sec., L: 31 sec. Goal status: INITIAL  ASSESSMENT:  CLINICAL IMPRESSION:  Pt. reports having had a  recent ER visit 2/2 to ovarian/endometrial issues. Pt. reports still not feeling great today, but the pain is better than it was. Pt. was provided with another HEP for the theraputty per her request. Pt. presents with difficulty with coordinating alternating 2 digits at a time bilaterally using the DigiFlex. Pt. will benefit from OT services to work on  improving BUE functioning in order to improve engagement in, and participation in ADL, and IADL functioning.  PERFORMANCE DEFICITS: in functional skills including ADLs, IADLs, coordination, dexterity, proprioception, ROM, strength, Fine motor control, Gross motor control, and UE functional use, cognitive skills including , and psychosocial skills including environmental adaptation, routines, and behaviors.   IMPAIRMENTS: are limiting patient from ADLs, IADLs, and leisure.   CO-MORBIDITIES: may have co-morbidities  that affects occupational performance. Patient will benefit from skilled OT to address above impairments and improve overall function.  MODIFICATION OR ASSISTANCE TO COMPLETE EVALUATION: Min-Moderate modification of tasks or assist with assess necessary to complete an evaluation.  OT OCCUPATIONAL PROFILE AND HISTORY: Detailed assessment: Review of records and additional review of physical, cognitive, psychosocial history related to current functional performance.  CLINICAL DECISION MAKING: Moderate - several treatment  options, min-mod task modification necessary  REHAB POTENTIAL: Good  EVALUATION COMPLEXITY: Moderate    PLAN:  OT FREQUENCY: 2x/week  OT DURATION: 12 weeks  PLANNED INTERVENTIONS: 97168 OT Re-evaluation, 97535 self care/ADL training, 82956 therapeutic exercise, 97530 therapeutic activity, 97112 neuromuscular re-education, 97018 paraffin, 21308 contrast bath, 97760 Orthotics management and training, 65784 Splinting (initial encounter), functional mobility training, patient/family education, and DME and/or AE instructions  RECOMMENDED OTHER SERVICES: PT  CONSULTED AND AGREED WITH PLAN OF CARE: Patient and family member/caregiver  PLAN FOR NEXT SESSION: Treatment   Olegario Messier, MS, OTR/L  07/23/2023, 1:24 PM

## 2023-07-25 ENCOUNTER — Ambulatory Visit: Payer: BC Managed Care – PPO

## 2023-07-25 ENCOUNTER — Ambulatory Visit

## 2023-07-30 ENCOUNTER — Ambulatory Visit: Admitting: Occupational Therapy

## 2023-07-30 ENCOUNTER — Ambulatory Visit: Payer: BC Managed Care – PPO

## 2023-08-01 ENCOUNTER — Ambulatory Visit: Payer: BC Managed Care – PPO

## 2023-08-01 ENCOUNTER — Ambulatory Visit: Admitting: Occupational Therapy

## 2023-08-06 ENCOUNTER — Ambulatory Visit: Payer: BC Managed Care – PPO

## 2023-08-06 ENCOUNTER — Ambulatory Visit: Admitting: Occupational Therapy

## 2023-08-06 DIAGNOSIS — M6281 Muscle weakness (generalized): Secondary | ICD-10-CM

## 2023-08-06 DIAGNOSIS — R2681 Unsteadiness on feet: Secondary | ICD-10-CM

## 2023-08-06 DIAGNOSIS — R262 Difficulty in walking, not elsewhere classified: Secondary | ICD-10-CM

## 2023-08-06 NOTE — Therapy (Signed)
 OUTPATIENT PHYSICAL THERAPY NEURO TREATMENT   Patient Name: Christine Chavez MRN: [id number] DOB:[birth year], 43 y.o., female Today's Date: 08/06/2023   PCP: Madaline Scales REFERRING PROVIDER: Devora Folks  END OF SESSION:  PT End of Session - 08/06/23 1355     Visit Number 5    Number of Visits 24    Date for PT Re-Evaluation 09/17/23    PT Start Time 1402    PT Stop Time 1444    PT Time Calculation (min) 42 min    Equipment Utilized During Treatment Gait belt    Activity Tolerance Patient tolerated treatment well;Patient limited by fatigue    Behavior During Therapy WFL for tasks assessed/performed                 Past Medical History:  Diagnosis Date   Bladder retention    Complication of anesthesia    ? seizures after anesthesia    Depression    Diarrhea due to malabsorption    Dizziness    Headache    Migraines    Neurogenic bladder    Pyelonephritis    Renal disorder    Vision abnormalities    Weight loss    Past Surgical History:  Procedure Laterality Date   ANTERIOR CRUCIATE LIGAMENT REPAIR  1997   APPENDECTOMY     BLADDER REMOVAL     COLONOSCOPY WITH PROPOFOL  N/A 11/11/2018   Procedure: COLONOSCOPY WITH PROPOFOL ;  Surgeon: Deveron Fly, MD;  Location: [facility] ENDOSCOPY;  Service: Endoscopy;  Laterality: N/A;   CYSTOSCOPY WITH STENT PLACEMENT Right 04/17/2016   Procedure: CYSTOSCOPY WITH STENT PLACEMENT;  Surgeon: Marco Severs, MD;  Location: [facility] ORS;  Service: Urology;  Laterality: Right;   ESOPHAGOGASTRODUODENOSCOPY (EGD) WITH PROPOFOL  N/A 11/11/2018   Procedure: ESOPHAGOGASTRODUODENOSCOPY (EGD) WITH PROPOFOL ;  Surgeon: Deveron Fly, MD;  Location: [facility] ENDOSCOPY;  Service: Endoscopy;  Laterality: N/A;   EXPLORATORY LAPAROTOMY  1999   HIP SURGERY Right    KIDNEY STONE SURGERY  04/2016   REVISION UROSTOMY CUTANEOUS     REVISION UROSTOMY CUTANEOUS  01/10/2018   SUPRAPUBIC CATHETER PLACEMENT  08/2017   TONSILLECTOMY      Patient Active Problem List   Diagnosis Date Noted   Stroke-like symptoms 06/13/2023   Refractory nausea and vomiting 02/22/2023   Syncope and collapse 02/19/2023   Palliative care by specialist 02/19/2023   UTI (urinary tract infection) 02/13/2023   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   Neurogenic bladder 02/13/2023   Hepatic steatosis 02/13/2023   Wheelchair dependent 02/13/2023   Secondary adrenal insufficiency (HCC) 02/13/2023   Right flank pain 02/13/2023   Iron deficiency anemia 10/02/2022   Seizure (HCC) 11/11/2018   Major depressive disorder, recurrent episode, moderate (HCC) 02/07/2018   Nephrolithiasis 04/16/2016   Numbness 07/28/2015   Bladder retention 06/23/2015   Abdominal pain 06/04/2015   Dizziness 05/18/2015   Neck pain 05/18/2015   Complicated migraine 04/28/2015   Other fatigue 04/28/2015   Abnormal finding on MRI of brain 04/28/2015   Diarrhea 03/29/2015   H/O disease 03/29/2015   Abnormal weight loss 03/29/2015   Muscle weakness (generalized) 03/14/2015   Headache, migraine 03/10/2015    ONSET DATE: 06/13/23  REFERRING DIAG: CVA  THERAPY DIAG:  Muscle weakness (generalized)  Unsteadiness on feet  Difficulty in walking, not elsewhere classified  Rationale for Evaluation and Treatment: Rehabilitation  SUBJECTIVE:  SUBJECTIVE STATEMENT: Patient missed last week due to her children being on spring break. She is having extensive low back pain limiting her functional mobility.  Pt accompanied by: significant other  PERTINENT HISTORY:  Patient is referred to PT s/p CVA.  Patient presents to PT s/p CVA on 06/13/23.  Pmh includes adrenal insufficiency, Hereditary spastic paraplegia (HSP), small fiber neuropathy c/b BLE paraplegia and neurogenic bladder s/p  cystectomy w/ ileal condiuit (05/2022), chronic pain, migraine. Patient was recently hospitalized prior to this at [facility] from 04/30/23 to 05/27/23. Patient reports L sided weakness. Is having stabbing pains for a few minutes.   PAIN:  Are you having pain?  Has been having sharp stabbing pains  PRECAUTIONS: Fall  RED FLAGS: None and hx of bladder removal      WEIGHT BEARING RESTRICTIONS: No  FALLS: Has patient fallen in last 6 months? Yes. Number of falls 3  LIVING ENVIRONMENT: Lives with: lives with their family and lives with their spouse Lives in: House/apartment Stairs:  ramp Has following equipment at home: Wheelchair (manual) and Ramped entry  PLOF: Independent with household mobility with device  PATIENT GOALS: to get stronger, do ADLs better.   OBJECTIVE:  Note: Objective measures were completed at Evaluation unless otherwise noted.  DIAGNOSTIC FINDINGS:  MRI results received from [facility] "1. 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 2. Adjacent small acute infarct within the posterior right frontal lobe white matter. 3. Background moderate multifocal T2 FLAIR hyperintense signal abnormality within the cerebral white matter (with a subcortical white matter predominance), greater than expected for age. These signal changes are nonspecific and differential considerations include accelerated chronic small vessel ischemic disease, sequelae of chronic migraine headaches, sequelae of vasculitis/hypercoagulable state and sequelae of a prior infectious/inflammatory process, among others.   MRA head:   Significantly motion degraded examination. No proximal intracranial large vessel occlusion is identified. Motion artifact precludes accurate assessment for stenoses and for vessel irregularity.    COGNITION: Overall cognitive status: Within functional limits for tasks  assessed   SENSATION: Light touch: Impaired ; below R knee has decreased sensation   COORDINATION: Heel shin: tonic shaking movements    MUSCLE TONE: LLE: Moderate RLE Moderate   POSTURE: forward head, increased thoracic kyphosis, posterior pelvic tilt, and flexed trunk     LOWER EXTREMITY MMT:    MMT Right Eval Left Eval  Hip flexion 6.2 5.6  Hip extension    Hip abduction 6.5 6.4  Hip adduction 3.4 3.4  Hip internal rotation    Hip external rotation    Knee flexion 5.1 1.0  Knee extension 7.1 3.2  Ankle dorsiflexion    Ankle plantarflexion    Ankle inversion    Ankle eversion    (Blank rows = not tested)  BED MOBILITY:  Will assess in future session.  TRANSFERS: Assistive device utilized: Environmental consultant - 4 wheeled  Sit to stand: CGA Stand to sit: CGA Chair to chair: Min A     GAIT: Gait pattern: step to pattern, knee flexed in stance- Right, knee flexed in stance- Left, ataxic, trunk flexed, poor foot clearance- Right, and poor foot clearance- Left Distance walked: 8 ft  Assistive device utilized: Walker - 4 wheeled Level of assistance: CGA and Min A Comments: Jerking tonic  gait pattern   FUNCTIONAL TESTS:  5 times sit to stand: 46.33 with hands  10 meter walk test: 1.04 for 8 ft; unable to finish  PATIENT SURVEYS:  Stroke Impact Scale give next session                                                                                                                               TREATMENT DATE: 08/06/23     TherEx Supine: Hamstring stretch 60 seconds each LE Single knee to chest 60 seconds each LE Figure four position 60 seconds each LE Sciatic nerve glide 20x each LE Thoracic rotation twists 10x;30 second holds at final rotation  Posterior pelvic tilt 10x  Seated: LAQ 10x Lateral trunk shift and stretch 10x each side Forward/backward trunk mobility 10x  Manual: STM to lumbar paraspinals x 16 minutes SAD to LLE in figure four position 3x30  seconds   PATIENT EDUCATION: Education details: goals, POC  Person educated: Patient Education method: Explanation, Demonstration, Tactile cues, and Verbal cues Education comprehension: verbalized understanding, returned demonstration, verbal cues required, and tactile cues required  HOME EXERCISE PROGRAM: Access Code: [email or web address] URL: [email or web address] Date: 06/25/2023 Prepared by: Tajanae Guilbault  Exercises - Leg Extension  - 1 x daily - 7 x weekly - 2 sets - 10 reps - 5 hold - Seated March  - 1 x daily - 7 x weekly - 2 sets - 10 reps - 5 hold - Seated Hip Adduction Isometrics with Ball  - 1 x daily - 7 x weekly - 2 sets - 10 reps - 5 hold - Seated Heel Toe Raises  - 1 x daily - 7 x weekly - 2 sets - 10 reps - 5 hold  GOALS: Goals reviewed with patient? Yes  SHORT TERM GOALS: Target date: 07/23/2023    Patient will be independent in home exercise program to improve strength/mobility for better functional independence with ADLs. Baseline: 3/10: give next session Goal status: INITIAL    LONG TERM GOALS: Target date: 09/17/2023  Patient (< 31 years old) will complete five times sit to stand test in < 10 seconds indicating an increased LE strength and improved balance. Baseline: 3/10: 46.33 Goal status: INITIAL  2.  Patient will perform 10 MWT under 2 minutes with no rest breaks indicating improved functional ambulation. Baseline: 3/10: 8 ft in 1 min 4 seconds Goal status: INITIAL  3.  Patient will tolerate standing 3 minutes for ADL performance Baseline: 5/40: knee buckling in standing Goal status: INITIAL  4.  Patient will increase BLE gross strength to 4+/5 and/or within 1lb of each limb  as to improve functional strength for independent gait, increased standing tolerance and increased ADL ability. Baseline: 3/10: see above  Goal status: INITIAL    ASSESSMENT:  CLINICAL IMPRESSION: Patient's low back pain reduced by end of session allowing for seated  position without radiating symptoms. Patient educated on pain reduction strategies and movements. She is highly motivated throughout session despite pain.  Patient will benefit from skilled physical therapy to increase functional mobility, strength, and independence with ADLs.   OBJECTIVE IMPAIRMENTS: Abnormal gait,  decreased activity tolerance, decreased balance, decreased coordination, decreased endurance, decreased mobility, difficulty walking, decreased strength, dizziness, impaired perceived functional ability, increased muscle spasms, impaired flexibility, improper body mechanics, and postural dysfunction.   ACTIVITY LIMITATIONS: carrying, lifting, bending, sitting, standing, squatting, sleeping, stairs, transfers, bed mobility, bathing, toileting, dressing, reach over head, hygiene/grooming, locomotion level, and caring for others  PARTICIPATION LIMITATIONS: meal prep, cleaning, laundry, interpersonal relationship, driving, shopping, community activity, yard work, and church  PERSONAL FACTORS: Age, Past/current experiences, Time since onset of injury/illness/exacerbation, Transportation, and 3+ comorbidities: adrenal insufficiency, Hereditary spastic paraplegia (HSP), small fiber neuropathy c/b BLE paraplegia and neurogenic bladder s/p cystectomy w/ ileal condiuit (05/2022), chronic pain, migraine  are also affecting patient's functional outcome.   REHAB POTENTIAL: Good  CLINICAL DECISION MAKING: Evolving/moderate complexity  EVALUATION COMPLEXITY: Moderate  PLAN:  PT FREQUENCY: 2x/week  PT DURATION: 12 weeks  PLANNED INTERVENTIONS: 97164- PT Re-evaluation, 97110-Therapeutic exercises, 97530- Therapeutic activity, 97112- Neuromuscular re-education, 97535- Self Care, 16109- Manual therapy, [id number]- Gait training, [phone]- Orthotic Fit/training, [phone]- Canalith repositioning, [id number]- Splinting, B1478- Electrical stimulation (unattended), [phone]- Electrical stimulation (manual), [id number]-  Vasopneumatic device, [id number]- Ultrasound, [id number]- Traction (mechanical), [id number]- Ionotophoresis 4mg /ml Dexamethasone , Patient/Family education, Balance training, Stair training, Taping, Dry Needling, Joint mobilization, Joint manipulation, Spinal manipulation, Spinal mobilization, Scar mobilization, Compression bandaging, Vestibular training, Visual/preceptual remediation/compensation, DME instructions, Wheelchair mobility training, Cryotherapy, Moist heat, and Biofeedback  PLAN FOR NEXT SESSION: strengthening, balance    Leahna Hewson, PT 08/06/2023, 5:23 PM

## 2023-08-06 NOTE — Therapy (Signed)
 OUTPATIENT OCCUPATIONAL THERAPY NEURO TREATMENT NOTE  Patient Name: Christine Chavez MRN: [id number] DOB:12/11/1980, 43 y.o., female Today's Date: 08/06/2023  PCP: Madaline Scales, PA-C REFERRING PROVIDER: Rosan Comfort, MD  END OF SESSION:  OT End of Session - 08/06/23 1538     Visit Number 4    Number of Visits 24    Date for OT Re-Evaluation 09/19/23    OT Start Time 1320    OT Stop Time 1400    OT Time Calculation (min) 40 min    Activity Tolerance Patient tolerated treatment well    Behavior During Therapy WFL for tasks assessed/performed             Past Medical History:  Diagnosis Date   Bladder retention    Complication of anesthesia    ? seizures after anesthesia    Depression    Diarrhea due to malabsorption    Dizziness    Headache    Migraines    Neurogenic bladder    Pyelonephritis    Renal disorder    Vision abnormalities    Weight loss    Past Surgical History:  Procedure Laterality Date   ANTERIOR CRUCIATE LIGAMENT REPAIR  1997   APPENDECTOMY     BLADDER REMOVAL     COLONOSCOPY WITH PROPOFOL  N/A 11/11/2018   Procedure: COLONOSCOPY WITH PROPOFOL ;  Surgeon: Deveron Fly, MD;  Location: [facility] ENDOSCOPY;  Service: Endoscopy;  Laterality: N/A;   CYSTOSCOPY WITH STENT PLACEMENT Right 04/17/2016   Procedure: CYSTOSCOPY WITH STENT PLACEMENT;  Surgeon: Marco Severs, MD;  Location: [facility] ORS;  Service: Urology;  Laterality: Right;   ESOPHAGOGASTRODUODENOSCOPY (EGD) WITH PROPOFOL  N/A 11/11/2018   Procedure: ESOPHAGOGASTRODUODENOSCOPY (EGD) WITH PROPOFOL ;  Surgeon: Deveron Fly, MD;  Location: [facility] ENDOSCOPY;  Service: Endoscopy;  Laterality: N/A;   EXPLORATORY LAPAROTOMY  1999   HIP SURGERY Right    KIDNEY STONE SURGERY  04/2016   REVISION UROSTOMY CUTANEOUS     REVISION UROSTOMY CUTANEOUS  01/10/2018   SUPRAPUBIC CATHETER PLACEMENT  08/2017   TONSILLECTOMY     Patient Active Problem List   Diagnosis Date Noted   Stroke-like  symptoms 06/13/2023   Refractory nausea and vomiting 02/22/2023   Syncope and collapse 02/19/2023   Palliative care by specialist 02/19/2023   UTI (urinary tract infection) 02/13/2023   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   Neurogenic bladder 02/13/2023   Hepatic steatosis 02/13/2023   Wheelchair dependent 02/13/2023   Secondary adrenal insufficiency (HCC) 02/13/2023   Right flank pain 02/13/2023   Iron deficiency anemia 10/02/2022   Seizure (HCC) 11/11/2018   Major depressive disorder, recurrent episode, moderate (HCC) 02/07/2018   Nephrolithiasis 04/16/2016   Numbness 07/28/2015   Bladder retention 06/23/2015   Abdominal pain 06/04/2015   Dizziness 05/18/2015   Neck pain 05/18/2015   Complicated migraine 04/28/2015   Other fatigue 04/28/2015   Abnormal finding on MRI of brain 04/28/2015   Diarrhea 03/29/2015   H/O disease 03/29/2015   Abnormal weight loss 03/29/2015   Muscle weakness (generalized) 03/14/2015   Headache, migraine 03/10/2015    ONSET DATE: 06/13/23  REFERRING DIAG: CVA, HSP  THERAPY DIAG:  Muscle weakness (generalized)  Rationale for Evaluation and Treatment: Rehabilitation  SUBJECTIVE:   SUBJECTIVE STATEMENT: Pt. Reports having bilateral flank, and back discomfort, and reports that she hopes that she is getting an infection Pt accompanied by: significant other Husband: Paul  PERTINENT HISTORY:   Pt. is a 43 y.o. female who was hospitalized with a  CVA from 06/13/23-06/14/23.  Imaging revealed two small acute cortical infarcts within the posterior Right frontal lobe (with involvement in the motor strip). PMHx includes: HSP With a  recent hospitalization from January 13-February 19th at [facility] 2/2 a Neuro Flare from the HSP. Hx of small fiber neuropathy d/t BLE paraplegia, Neurogenic bladder s/p cystectomy with ideal conduit (2/24) ,chronic pain, migraine. Pt. Reports multiple hospitalizations of the past year.  PRECAUTIONS:  None  WEIGHT BEARING RESTRICTIONS: No  PAIN:  Are you having pain? Bilateral flank discomfort/back discomfort  FALLS: Has patient fallen in last 6 months? Yes. Number of falls 3  LIVING ENVIRONMENT: Lives with: lives with their family Lives in: House/apartment, One level Stairs:  ramp. Has following equipment at home: Otho Blitz - 4 wheeled, Wheelchair (power), Wheelchair (manual), Shower bench, and bed side commode, bed rail  PLOF: Independent  PATIENT GOALS:  Regain strength, and hand coordination  OBJECTIVE:  Note: Objective measures were completed at Evaluation unless otherwise noted.  HAND DOMINANCE: Right  ADLs: Overall ADLs:  Transfers/ambulation related to ADLs: Eating:  Independent, Cutting. Difficulty opening  Grooming: Independent UB Dressing: Independent LB Dressing: Independent, set-up Toileting:  Independent, Ostomy Bag Bathing: Independent once in the shower            Shower transfers: Husband assists with shower transfers.   IADLs: Shopping: Orders online. Husband picks them up Light housekeeping: Family assists Meal Prep: Engineer, structural, Assist with meals, cutting Community mobility: Relies on family, and friends Medication management: Independent Financial management: No change Handwriting: 100% legible, Pain/fatigues with writing note cards. Hobbies: Making gifts baskets; shopping ([employer] is a favorite)  MOBILITY STATUS: Uses a w/c  POSTURE COMMENTS:   Sitting balance:  WFL  ACTIVITY TOLERANCE: Activity tolerance: limited  FUNCTIONAL OUTCOME MEASURES: MAM-20: Sum score: 73/80; MAM Measure score: 71.8  UPPER EXTREMITY ROM:    Active ROM Right Eval WFL Left Eval [facility]  Shoulder flexion    Shoulder abduction    Shoulder adduction    Shoulder extension    Shoulder internal rotation    Shoulder external rotation    Elbow flexion    Elbow extension    Wrist flexion    Wrist extension    Wrist ulnar deviation    Wrist radial deviation     Wrist pronation    Wrist supination    (Blank rows = not tested)  UPPER EXTREMITY MMT:     MMT Right eval Left eval  Shoulder flexion 3+/5 3+/5  Shoulder abduction 3+/5 3+/5  Shoulder adduction    Shoulder extension    Shoulder internal rotation    Shoulder external rotation    Middle trapezius    Lower trapezius    Elbow flexion 3+/5 3+/5  Elbow extension 3+/5 3+/5  Wrist flexion    Wrist extension 3+/5 3+/5  Wrist ulnar deviation    Wrist radial deviation    Wrist pronation 3+/5 3+/5  Wrist supination 3+/5 3+/5  (Blank rows = not tested)  HAND FUNCTION: Grip strength: Right: 5 lbs; Left: 2 lbs and Lateral pinch: Right: 5 lbs, Left: 2 lbs, 3pt. Pinch: R: 5 lbs, L: 2 lbs; 2pt. Pinch: R: 1 lbs, L: 1lb  COORDINATION: 9 Hole Peg test: Right: 25 sec; Left: 31 sec  SENSATION: WFL light touch, and proprioceptive awareness  EDEMA: N/A   COGNITION: Overall cognitive status: Within functional limits for tasks assessed  VISION: Subjective report: Reports blurriness  PERCEPTION: WFL  PRAXIS: WFL  TREATMENT DATE: 08/06/2023   Therapeutic Activities  -Progressive bilateral gross grip strengthening for 2 trials each with 11.9# of grip strength resistive force. -Pt. performed translatory movements moving the pegs through her hand from the palm to the tip of the 2nd digit, and thumb in preparation for placing them vertically into the pegboard. -Incorporated reaching in multiple planes to further challenge the task. -EZ Board exercises performed to target bilateral forearm supination/pronation, wrist flexion/extension using gross grasp, and lateral pinch (key) grasp in multiple  planes to promote shoulder flexion, abduction, and wrist flexion, and extension while performing resistive wrist flexion and extension with a gross grip.  [facility] skills grasping one  inch resistive cubes alternating thumb opposition to the tip of the 2nd through 5th digits. The board was positioned at a vertical angle. Pt. Worked on pressing them back into place while isolating 2nd through 5th digits.   Therapeutic Ex:  -Lateral, and 3pt. Pinch strengthening using yellow, red, green, and blue, and black level resistive clips. Pt. worked on placing them onto a small horizontal dowel. -Bilateral digit flexion using the Digi-Flex, for single digit reps followed by alternating 2 digits at a time. --Performed BUE strengthening using 2# hand weight for elbow flexion, and extension, forearm supination/pronation, wrist flexion/extension, and radial deviation.    - Digit flexion  was performed using the DigiFlex 1.5#   PATIENT EDUCATION: Education details: OT services, POC, goals, Pt. Functional status Person educated: Patient and Spouse Education method: Demonstration and Verbal cues,verbal cues Education comprehension: verbalized understanding and needs further education  HOME EXERCISE PROGRAM:  Continue ongoing assessment, and provide as indicated   GOALS: Goals reviewed with patient? Yes  SHORT TERM GOALS: Target date: 08/08/2023    Pt. Will be independent with HEPs for BUEs. Baseline:No current HEP Goal status: INITIAL   LONG TERM GOALS: Target date: 09/19/2023    Pt. Will be improve BUE strength by 2 mm grades to be able to independently reach for ADL/IADL items in multiple planes. Baseline: Eval: BUE strength: 3+/5 overall  Goal status: INITIAL  2.  Pt. will improve bilateral grip strength to be able to securely hold items. Baseline: Eval: R: 5#, L: 2# Goal status: INITIAL  3.  Pt. Will improve bilateral lateral pinch strength by 3#  to be able to open medication bottles Baseline: Eval: R: 5#, L: 2# Goal status: INITIAL  4.  Pt. Will improve bilateral 3pt. pinch strength to be able to independently use a nail clipper Baseline: Eval: R: 3#, L: 2# Goal  status: INITIAL  5.  Pt. Will will improve lef [facility] skills to be able to manipulate small objects. Baseline: Eval: R: 25 sec., L: 31 sec. Goal status: INITIAL  ASSESSMENT:  CLINICAL IMPRESSION:  Pt. reports having bilateral flank/back discomfort, and reports that she hopes that she is not developing an infection. Pt. was able to tolerate bilateral grip strength, however the LUE fatigues with LUE reaching while sustaining grip. Pt. required cues for visual demonstration of proper technique. Pt. Tolerated with distal LUE strengthening with a 2# weight. Pt. is improving with coordinating alternating 2 digits at a time bilaterally using the DigiFlex. Pt.'s BUE continues to fatigue, however Pt. is progressing with engaging her hands during daily tasks. Pt. will benefit from OT services to work on improving BUE functioning in order to improve engagement in, and participation in ADL, and IADL functioning.  PERFORMANCE DEFICITS: in functional skills including ADLs, IADLs, coordination, dexterity, proprioception, ROM, strength, Fine motor control, Gross motor  control, and UE functional use, cognitive skills including , and psychosocial skills including environmental adaptation, routines, and behaviors.   IMPAIRMENTS: are limiting patient from ADLs, IADLs, and leisure.   CO-MORBIDITIES: may have co-morbidities  that affects occupational performance. Patient will benefit from skilled OT to address above impairments and improve overall function.  MODIFICATION OR ASSISTANCE TO COMPLETE EVALUATION: Min-Moderate modification of tasks or assist with assess necessary to complete an evaluation.  OT OCCUPATIONAL PROFILE AND HISTORY: Detailed assessment: Review of records and additional review of physical, cognitive, psychosocial history related to current functional performance.  CLINICAL DECISION MAKING: Moderate - several treatment options, min-mod task modification necessary  REHAB POTENTIAL:  Good  EVALUATION COMPLEXITY: Moderate    PLAN:  OT FREQUENCY: 2x/week  OT DURATION: 12 weeks  PLANNED INTERVENTIONS: 97168 OT Re-evaluation, 97535 self care/ADL training, 13086 therapeutic exercise, 97530 therapeutic activity, 97112 neuromuscular re-education, 97018 paraffin, 57846 contrast bath, 97760 Orthotics management and training, 96295 Splinting (initial encounter), functional mobility training, patient/family education, and DME and/or AE instructions  RECOMMENDED OTHER SERVICES: PT  CONSULTED AND AGREED WITH PLAN OF CARE: Patient and family member/caregiver  PLAN FOR NEXT SESSION: Treatment   Duey Ghent, MS, OTR/L  08/06/2023, 3:43 PM

## 2023-08-07 NOTE — Therapy (Signed)
 OUTPATIENT PHYSICAL THERAPY NEURO TREATMENT   Patient Name: Christine Chavez MRN: [id number] DOB:07/28/1980, 43 y.o., female Today's Date: 08/08/2023   PCP: Madaline Scales REFERRING PROVIDER: Devora Folks  END OF SESSION:  PT End of Session - 08/08/23 1354     Visit Number 6    Number of Visits 24    Date for PT Re-Evaluation 09/17/23    PT Start Time 1403    PT Stop Time 1444    PT Time Calculation (min) 41 min    Equipment Utilized During Treatment Gait belt    Activity Tolerance Patient tolerated treatment well;Patient limited by fatigue    Behavior During Therapy WFL for tasks assessed/performed                  Past Medical History:  Diagnosis Date   Bladder retention    Complication of anesthesia    ? seizures after anesthesia    Depression    Diarrhea due to malabsorption    Dizziness    Headache    Migraines    Neurogenic bladder    Pyelonephritis    Renal disorder    Vision abnormalities    Weight loss    Past Surgical History:  Procedure Laterality Date   ANTERIOR CRUCIATE LIGAMENT REPAIR  1997   APPENDECTOMY     BLADDER REMOVAL     COLONOSCOPY WITH PROPOFOL  N/A 11/11/2018   Procedure: COLONOSCOPY WITH PROPOFOL ;  Surgeon: Deveron Fly, MD;  Location: [facility] ENDOSCOPY;  Service: Endoscopy;  Laterality: N/A;   CYSTOSCOPY WITH STENT PLACEMENT Right 04/17/2016   Procedure: CYSTOSCOPY WITH STENT PLACEMENT;  Surgeon: Marco Severs, MD;  Location: [facility] ORS;  Service: Urology;  Laterality: Right;   ESOPHAGOGASTRODUODENOSCOPY (EGD) WITH PROPOFOL  N/A 11/11/2018   Procedure: ESOPHAGOGASTRODUODENOSCOPY (EGD) WITH PROPOFOL ;  Surgeon: Deveron Fly, MD;  Location: [facility] ENDOSCOPY;  Service: Endoscopy;  Laterality: N/A;   EXPLORATORY LAPAROTOMY  1999   HIP SURGERY Right    KIDNEY STONE SURGERY  04/2016   REVISION UROSTOMY CUTANEOUS     REVISION UROSTOMY CUTANEOUS  01/10/2018   SUPRAPUBIC CATHETER PLACEMENT  08/2017   TONSILLECTOMY      Patient Active Problem List   Diagnosis Date Noted   Stroke-like symptoms 06/13/2023   Refractory nausea and vomiting 02/22/2023   Syncope and collapse 02/19/2023   Palliative care by specialist 02/19/2023   UTI (urinary tract infection) 02/13/2023   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   Neurogenic bladder 02/13/2023   Hepatic steatosis 02/13/2023   Wheelchair dependent 02/13/2023   Secondary adrenal insufficiency (HCC) 02/13/2023   Right flank pain 02/13/2023   Iron deficiency anemia 10/02/2022   Seizure (HCC) 11/11/2018   Major depressive disorder, recurrent episode, moderate (HCC) 02/07/2018   Nephrolithiasis 04/16/2016   Numbness 07/28/2015   Bladder retention 06/23/2015   Abdominal pain 06/04/2015   Dizziness 05/18/2015   Neck pain 05/18/2015   Complicated migraine 04/28/2015   Other fatigue 04/28/2015   Abnormal finding on MRI of brain 04/28/2015   Diarrhea 03/29/2015   H/O disease 03/29/2015   Abnormal weight loss 03/29/2015   Muscle weakness (generalized) 03/14/2015   Headache, migraine 03/10/2015    ONSET DATE: 06/13/23  REFERRING DIAG: CVA  THERAPY DIAG:  Muscle weakness (generalized)  Unsteadiness on feet  Difficulty in walking, not elsewhere classified  Rationale for Evaluation and Treatment: Rehabilitation  SUBJECTIVE:  SUBJECTIVE STATEMENT: Patient reports she had to take nausea medicine prior to PT session. Pt accompanied by: significant other  PERTINENT HISTORY:  Patient is referred to PT s/p CVA.  Patient presents to PT s/p CVA on 06/13/23.  Pmh includes adrenal insufficiency, Hereditary spastic paraplegia (HSP), small fiber neuropathy c/b BLE paraplegia and neurogenic bladder s/p cystectomy w/ ileal condiuit (05/2022), chronic pain, migraine. Patient  was recently hospitalized prior to this at [facility] from 04/30/23 to 05/27/23. Patient reports L sided weakness. Is having stabbing pains for a few minutes.   PAIN:  Are you having pain?  Has been having sharp stabbing pains  PRECAUTIONS: Fall  RED FLAGS: None and hx of bladder removal      WEIGHT BEARING RESTRICTIONS: No  FALLS: Has patient fallen in last 6 months? Yes. Number of falls 3  LIVING ENVIRONMENT: Lives with: lives with their family and lives with their spouse Lives in: House/apartment Stairs:  ramp Has following equipment at home: Wheelchair (manual) and Ramped entry  PLOF: Independent with household mobility with device  PATIENT GOALS: to get stronger, do ADLs better.   OBJECTIVE:  Note: Objective measures were completed at Evaluation unless otherwise noted.  DIAGNOSTIC FINDINGS:  MRI results received from [facility]iology "1. 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 2. Adjacent small acute infarct within the posterior right frontal lobe white matter. 3. Background moderate multifocal T2 FLAIR hyperintense signal abnormality within the cerebral white matter (with a subcortical white matter predominance), greater than expected for age. These signal changes are nonspecific and differential considerations include accelerated chronic small vessel ischemic disease, sequelae of chronic migraine headaches, sequelae of vasculitis/hypercoagulable state and sequelae of a prior infectious/inflammatory process, among others.   MRA head:   Significantly motion degraded examination. No proximal intracranial large vessel occlusion is identified. Motion artifact precludes accurate assessment for stenoses and for vessel irregularity.    COGNITION: Overall cognitive status: Within functional limits for tasks assessed   SENSATION: Light touch: Impaired ; below R knee has decreased sensation    COORDINATION: Heel shin: tonic shaking movements    MUSCLE TONE: LLE: Moderate RLE Moderate   POSTURE: forward head, increased thoracic kyphosis, posterior pelvic tilt, and flexed trunk     LOWER EXTREMITY MMT:    MMT Right Eval Left Eval  Hip flexion 6.2 5.6  Hip extension    Hip abduction 6.5 6.4  Hip adduction 3.4 3.4  Hip internal rotation    Hip external rotation    Knee flexion 5.1 1.0  Knee extension 7.1 3.2  Ankle dorsiflexion    Ankle plantarflexion    Ankle inversion    Ankle eversion    (Blank rows = not tested)  BED MOBILITY:  Will assess in future session.  TRANSFERS: Assistive device utilized: Environmental consultant - 4 wheeled  Sit to stand: CGA Stand to sit: CGA Chair to chair: Min A     GAIT: Gait pattern: step to pattern, knee flexed in stance- Right, knee flexed in stance- Left, ataxic, trunk flexed, poor foot clearance- Right, and poor foot clearance- Left Distance walked: 8 ft  Assistive device utilized: Walker - 4 wheeled Level of assistance: CGA and Min A Comments: Jerking tonic  gait pattern   FUNCTIONAL TESTS:  5 times sit to stand: 46.33 with hands  10 meter walk test: 1.04 for 8 ft; unable to finish  PATIENT SURVEYS:  Stroke Impact Scale give next session  TREATMENT DATE: 08/08/23    TherAct: Static stand at // bar 60 seconds x 2 trials Heel raise at // bar 15x Standing march at // bar 15x Stand pivot transfer to/from wheelchair to/from table  TherEx Supine: Hamstring stretch 60 seconds each LE Single knee to chest 60 seconds each LE Figure four position 60 seconds each LE Thoracic rotation twists 10x;30 second holds at final rotation  Posterior pelvic tilt 10x BTB abduction 15x; 2 sets BTB march 15x; 2 sets Heel slides 10x each LE  Seated: LAQ 10x BTB hamstring curl 15x each LE   PATIENT  EDUCATION: Education details: goals, POC  Person educated: Patient Education method: Explanation, Demonstration, Tactile cues, and Verbal cues Education comprehension: verbalized understanding, returned demonstration, verbal cues required, and tactile cues required  HOME EXERCISE PROGRAM: Access Code: [email or web address] URL: [email or web address] Date: 06/25/2023 Prepared by: [place] La  Exercises - Leg Extension  - 1 x daily - 7 x weekly - 2 sets - 10 reps - 5 hold - Seated March  - 1 x daily - 7 x weekly - 2 sets - 10 reps - 5 hold - Seated Hip Adduction Isometrics with Ball  - 1 x daily - 7 x weekly - 2 sets - 10 reps - 5 hold - Seated Heel Toe Raises  - 1 x daily - 7 x weekly - 2 sets - 10 reps - 5 hold  GOALS: Goals reviewed with patient? Yes  SHORT TERM GOALS: Target date: 07/23/2023    Patient will be independent in home exercise program to improve strength/mobility for better functional independence with ADLs. Baseline: 3/10: give next session Goal status: INITIAL    LONG TERM GOALS: Target date: 09/17/2023  Patient (< 64 years old) will complete five times sit to stand test in < 10 seconds indicating an increased LE strength and improved balance. Baseline: 3/10: 46.33 Goal status: INITIAL  2.  Patient will perform 10 MWT under 2 minutes with no rest breaks indicating improved functional ambulation. Baseline: 3/10: 8 ft in 1 min 4 seconds Goal status: INITIAL  3.  Patient will tolerate standing 3 minutes for ADL performance Baseline: 7/82: knee buckling in standing Goal status: INITIAL  4.  Patient will increase BLE gross strength to 4+/5 and/or within 1lb of each limb  as to improve functional strength for independent gait, increased standing tolerance and increased ADL ability. Baseline: 3/10: see above  Goal status: INITIAL    ASSESSMENT:  CLINICAL IMPRESSION: Patient is educated on functional strength and mobility.  Her back pain is significantly  improved from previous session with patient reporting majority of symptoms relieved s/p last session. Patient is eager to progress her pain free mobility and strength.   Patient will benefit from skilled physical therapy to increase functional mobility, strength, and independence with ADLs.   OBJECTIVE IMPAIRMENTS: Abnormal gait, decreased activity tolerance, decreased balance, decreased coordination, decreased endurance, decreased mobility, difficulty walking, decreased strength, dizziness, impaired perceived functional ability, increased muscle spasms, impaired flexibility, improper body mechanics, and postural dysfunction.   ACTIVITY LIMITATIONS: carrying, lifting, bending, sitting, standing, squatting, sleeping, stairs, transfers, bed mobility, bathing, toileting, dressing, reach over head, hygiene/grooming, locomotion level, and caring for others  PARTICIPATION LIMITATIONS: meal prep, cleaning, laundry, interpersonal relationship, driving, shopping, community activity, yard work, and church  PERSONAL FACTORS: Age, Past/current experiences, Time since onset of injury/illness/exacerbation, Transportation, and 3+ comorbidities: adrenal insufficiency, Hereditary spastic paraplegia (HSP), small fiber neuropathy c/b BLE paraplegia and neurogenic bladder s/p cystectomy w/ ileal condiuit (05/2022),  chronic pain, migraine  are also affecting patient's functional outcome.   REHAB POTENTIAL: Good  CLINICAL DECISION MAKING: Evolving/moderate complexity  EVALUATION COMPLEXITY: Moderate  PLAN:  PT FREQUENCY: 2x/week  PT DURATION: 12 weeks  PLANNED INTERVENTIONS: 97164- PT Re-evaluation, 97110-Therapeutic exercises, 97530- Therapeutic activity, 97112- Neuromuscular re-education, 97535- Self Care, 62130- Manual therapy, [id number]- Gait training, [phone]- Orthotic Fit/training, [phone]- Canalith repositioning, [id number]- Splinting, [id number]- Electrical stimulation (unattended), [phone]- Electrical stimulation (manual),  [id number]- Vasopneumatic device, [id number]- Ultrasound, [id number]- Traction (mechanical), [id number]- Ionotophoresis 4mg /ml Dexamethasone , Patient/Family education, Balance training, Stair training, Taping, Dry Needling, Joint mobilization, Joint manipulation, Spinal manipulation, Spinal mobilization, Scar mobilization, Compression bandaging, Vestibular training, Visual/preceptual remediation/compensation, DME instructions, Wheelchair mobility training, Cryotherapy, Moist heat, and Biofeedback  PLAN FOR NEXT SESSION: strengthening, balance    Sayward Horvath, PT 08/08/2023, 2:44 PM

## 2023-08-08 ENCOUNTER — Ambulatory Visit: Admitting: Occupational Therapy

## 2023-08-08 ENCOUNTER — Ambulatory Visit: Payer: BC Managed Care – PPO

## 2023-08-08 DIAGNOSIS — M6281 Muscle weakness (generalized): Secondary | ICD-10-CM | POA: Diagnosis not present

## 2023-08-08 DIAGNOSIS — R2681 Unsteadiness on feet: Secondary | ICD-10-CM

## 2023-08-08 DIAGNOSIS — R262 Difficulty in walking, not elsewhere classified: Secondary | ICD-10-CM

## 2023-08-08 NOTE — Therapy (Signed)
 OUTPATIENT OCCUPATIONAL THERAPY NEURO TREATMENT NOTE  Patient Name: Christine Chavez MRN: [id number] DOB:June 10, 1980, 43 y.o., female Today's Date: 08/08/2023  PCP: Madaline Scales, PA-C REFERRING PROVIDER: Rosan Comfort, MD  END OF SESSION:  OT End of Session - 08/08/23 1811     Visit Number 5    Number of Visits 24    Date for OT Re-Evaluation 09/19/23    OT Start Time 1315    OT Stop Time 1400    OT Time Calculation (min) 45 min    Activity Tolerance Patient tolerated treatment well    Behavior During Therapy WFL for tasks assessed/performed             Past Medical History:  Diagnosis Date   Bladder retention    Complication of anesthesia    ? seizures after anesthesia    Depression    Diarrhea due to malabsorption    Dizziness    Headache    Migraines    Neurogenic bladder    Pyelonephritis    Renal disorder    Vision abnormalities    Weight loss    Past Surgical History:  Procedure Laterality Date   ANTERIOR CRUCIATE LIGAMENT REPAIR  1997   APPENDECTOMY     BLADDER REMOVAL     COLONOSCOPY WITH PROPOFOL  N/A 11/11/2018   Procedure: COLONOSCOPY WITH PROPOFOL ;  Surgeon: Deveron Fly, MD;  Location: [facility] ENDOSCOPY;  Service: Endoscopy;  Laterality: N/A;   CYSTOSCOPY WITH STENT PLACEMENT Right 04/17/2016   Procedure: CYSTOSCOPY WITH STENT PLACEMENT;  Surgeon: Marco Severs, MD;  Location: [facility] ORS;  Service: Urology;  Laterality: Right;   ESOPHAGOGASTRODUODENOSCOPY (EGD) WITH PROPOFOL  N/A 11/11/2018   Procedure: ESOPHAGOGASTRODUODENOSCOPY (EGD) WITH PROPOFOL ;  Surgeon: Deveron Fly, MD;  Location: [facility] ENDOSCOPY;  Service: Endoscopy;  Laterality: N/A;   EXPLORATORY LAPAROTOMY  1999   HIP SURGERY Right    KIDNEY STONE SURGERY  04/2016   REVISION UROSTOMY CUTANEOUS     REVISION UROSTOMY CUTANEOUS  01/10/2018   SUPRAPUBIC CATHETER PLACEMENT  08/2017   TONSILLECTOMY     Patient Active Problem List   Diagnosis Date Noted   Stroke-like  symptoms 06/13/2023   Refractory nausea and vomiting 02/22/2023   Syncope and collapse 02/19/2023   Palliative care by specialist 02/19/2023   UTI (urinary tract infection) 02/13/2023   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   Neurogenic bladder 02/13/2023   Hepatic steatosis 02/13/2023   Wheelchair dependent 02/13/2023   Secondary adrenal insufficiency (HCC) 02/13/2023   Right flank pain 02/13/2023   Iron deficiency anemia 10/02/2022   Seizure (HCC) 11/11/2018   Major depressive disorder, recurrent episode, moderate (HCC) 02/07/2018   Nephrolithiasis 04/16/2016   Numbness 07/28/2015   Bladder retention 06/23/2015   Abdominal pain 06/04/2015   Dizziness 05/18/2015   Neck pain 05/18/2015   Complicated migraine 04/28/2015   Other fatigue 04/28/2015   Abnormal finding on MRI of brain 04/28/2015   Diarrhea 03/29/2015   H/O disease 03/29/2015   Abnormal weight loss 03/29/2015   Muscle weakness (generalized) 03/14/2015   Headache, migraine 03/10/2015    ONSET DATE: 06/13/23  REFERRING DIAG: CVA, HSP  THERAPY DIAG:  Muscle weakness (generalized)  Rationale for Evaluation and Treatment: Rehabilitation  SUBJECTIVE:   SUBJECTIVE STATEMENT: Pt. Reports doing okay today Pt accompanied by: significant other Husband: Paul  PERTINENT HISTORY:   Pt. is a 43 y.o. female who was hospitalized with a CVA from 06/13/23-06/14/23.  Imaging revealed two small acute cortical infarcts within the posterior  Right frontal lobe (with involvement in the motor strip). PMHx includes: HSP With a  recent hospitalization from January 13-February 19th at [facility] 2/2 a Neuro Flare from the HSP. Hx of small fiber neuropathy d/t BLE paraplegia, Neurogenic bladder s/p cystectomy with ideal conduit (2/24) ,chronic pain, migraine. Pt. Reports multiple hospitalizations of the past year.  PRECAUTIONS: None  WEIGHT BEARING RESTRICTIONS: No  PAIN:  Are you having pain? Bilateral flank  discomfort/back discomfort  FALLS: Has patient fallen in last 6 months? Yes. Number of falls 3  LIVING ENVIRONMENT: Lives with: lives with their family Lives in: House/apartment, One level Stairs:  ramp. Has following equipment at home: Otho Blitz - 4 wheeled, Wheelchair (power), Wheelchair (manual), Shower bench, and bed side commode, bed rail  PLOF: Independent  PATIENT GOALS:  Regain strength, and hand coordination  OBJECTIVE:  Note: Objective measures were completed at Evaluation unless otherwise noted.  HAND DOMINANCE: Right  ADLs: Overall ADLs:  Transfers/ambulation related to ADLs: Eating:  Independent, Cutting. Difficulty opening  Grooming: Independent UB Dressing: Independent LB Dressing: Independent, set-up Toileting:  Independent, Ostomy Bag Bathing: Independent once in the shower            Shower transfers: Husband assists with shower transfers.   IADLs: Shopping: Orders online. Husband picks them up Light housekeeping: Family assists Meal Prep: Engineer, structural, Assist with meals, cutting Community mobility: Relies on family, and friends Medication management: Independent Financial management: No change Handwriting: 100% legible, Pain/fatigues with writing note cards. Hobbies: Making gifts baskets; shopping ([employer] is a favorite)  MOBILITY STATUS: Uses a w/c  POSTURE COMMENTS:   Sitting balance:  WFL  ACTIVITY TOLERANCE: Activity tolerance: limited  FUNCTIONAL OUTCOME MEASURES: MAM-20: Sum score: 73/80; MAM Measure score: 71.8  UPPER EXTREMITY ROM:    Active ROM Right Eval WFL Left Eval [facility]  Shoulder flexion    Shoulder abduction    Shoulder adduction    Shoulder extension    Shoulder internal rotation    Shoulder external rotation    Elbow flexion    Elbow extension    Wrist flexion    Wrist extension    Wrist ulnar deviation    Wrist radial deviation    Wrist pronation    Wrist supination    (Blank rows = not tested)  UPPER EXTREMITY  MMT:     MMT Right eval Left eval  Shoulder flexion 3+/5 3+/5  Shoulder abduction 3+/5 3+/5  Shoulder adduction    Shoulder extension    Shoulder internal rotation    Shoulder external rotation    Middle trapezius    Lower trapezius    Elbow flexion 3+/5 3+/5  Elbow extension 3+/5 3+/5  Wrist flexion    Wrist extension 3+/5 3+/5  Wrist ulnar deviation    Wrist radial deviation    Wrist pronation 3+/5 3+/5  Wrist supination 3+/5 3+/5  (Blank rows = not tested)  HAND FUNCTION: Grip strength: Right: 5 lbs; Left: 2 lbs and Lateral pinch: Right: 5 lbs, Left: 2 lbs, 3pt. Pinch: R: 5 lbs, L: 2 lbs; 2pt. Pinch: R: 1 lbs, L: 1lb  COORDINATION: 9 Hole Peg test: Right: 25 sec; Left: 31 sec  SENSATION: WFL light touch, and proprioceptive awareness  EDEMA: N/A   COGNITION: Overall cognitive status: Within functional limits for tasks assessed  VISION: Subjective report: Reports blurriness  PERCEPTION: WFL  PRAXIS: WFL  TREATMENT DATE: 08/08/2023   Therapeutic Ex:  -Performed BUE strengthening using 3# hand weight for elbow flexion, and extension, forearm supination/pronation, 2# for wrist flexion/extension, and radial deviation.  -Bilateral Lateral, and 3pt. pinch strengthening using yellow, red, green, and blue, and black level resistive clips. Pt. worked on placing them onto a small horizontal dowel.  Neuromuscular re-education:   Pt. performed [facility] skills training to improve speed and dexterity needed for ADL tasks and writing. Pt. demonstrated grasping 1 inch sticks and   inch cylindrical collars on the Purdue pegboard. Pt. performed grasping each item with her 2nd digit and thumb, and storing them in the palm. Pt. presented with difficulty storing  inch objects at a time in the palmar aspect of the hand.  -Facilitated bilateral alternating hand  movements to remove the Purdue objects.       PATIENT EDUCATION: Education details: OT services, POC, goals, Pt. Functional status Person educated: Patient and Spouse Education method: Demonstration and Verbal cues,verbal cues Education comprehension: verbalized understanding and needs further education  HOME EXERCISE PROGRAM:  Continue ongoing assessment, and provide as indicated   GOALS: Goals reviewed with patient? Yes  SHORT TERM GOALS: Target date: 08/08/2023    Pt. Will be independent with HEPs for BUEs. Baseline:No current HEP Goal status: INITIAL   LONG TERM GOALS: Target date: 09/19/2023    Pt. Will be improve BUE strength by 2 mm grades to be able to independently reach for ADL/IADL items in multiple planes. Baseline: Eval: BUE strength: 3+/5 overall  Goal status: INITIAL  2.  Pt. will improve bilateral grip strength to be able to securely hold items. Baseline: Eval: R: 5#, L: 2# Goal status: INITIAL  3.  Pt. Will improve bilateral lateral pinch strength by 3#  to be able to open medication bottles Baseline: Eval: R: 5#, L: 2# Goal status: INITIAL  4.  Pt. Will improve bilateral 3pt. pinch strength to be able to independently use a nail clipper Baseline: Eval: R: 3#, L: 2# Goal status: INITIAL  5.  Pt. Will will improve [facility] skills to be able to manipulate small objects. Baseline: Eval: R: 25 sec., L: 31 sec. Goal status: INITIAL  ASSESSMENT:  CLINICAL IMPRESSION:  Pt. reports having bilateral flank/back discomfort, and reports that she hopes that she is not developing an infection. Pt. was able to tolerate bilateral grip strength, however the LUE fatigues with LUE reaching while sustaining grip. Pt. required cues for visual demonstration of proper technique. Pt. Tolerated increased weight for supination, and pronation with distal LUE strengthening with a 2# weight. Pt. Was able to manipulate the small purdue objects without dropping pegs excessively  from her hands during taraslatory movements. Pt.'s BUE continues to fatigue, however Pt. is progressing with engaging her hands during daily tasks. Pt. will benefit from OT services to work on improving BUE functioning in order to improve engagement in, and participation in ADL, and IADL functioning.  PERFORMANCE DEFICITS: in functional skills including ADLs, IADLs, coordination, dexterity, proprioception, ROM, strength, Fine motor control, Gross motor control, and UE functional use, cognitive skills including , and psychosocial skills including environmental adaptation, routines, and behaviors.   IMPAIRMENTS: are limiting patient from ADLs, IADLs, and leisure.   CO-MORBIDITIES: may have co-morbidities  that affects occupational performance. Patient will benefit from skilled OT to address above impairments and improve overall function.  MODIFICATION OR ASSISTANCE TO COMPLETE EVALUATION: Min-Moderate modification of tasks or assist with assess necessary to complete an evaluation.  OT OCCUPATIONAL PROFILE  AND HISTORY: Detailed assessment: Review of records and additional review of physical, cognitive, psychosocial history related to current functional performance.  CLINICAL DECISION MAKING: Moderate - several treatment options, min-mod task modification necessary  REHAB POTENTIAL: Good  EVALUATION COMPLEXITY: Moderate    PLAN:  OT FREQUENCY: 2x/week  OT DURATION: 12 weeks  PLANNED INTERVENTIONS: 97168 OT Re-evaluation, 97535 self care/ADL training, 32355 therapeutic exercise, 97530 therapeutic activity, 97112 neuromuscular re-education, 97018 paraffin, 73220 contrast bath, 97760 Orthotics management and training, 25427 Splinting (initial encounter), functional mobility training, patient/family education, and DME and/or AE instructions  RECOMMENDED OTHER SERVICES: PT  CONSULTED AND AGREED WITH PLAN OF CARE: Patient and family member/caregiver  PLAN FOR NEXT SESSION: Treatment   Duey Ghent, MS, OTR/L  08/08/2023, 6:14 PM

## 2023-08-09 NOTE — Therapy (Signed)
 OUTPATIENT PHYSICAL THERAPY NEURO TREATMENT   Patient Name: Christine Chavez MRN: [id number] DOB:1980-10-08, 43 y.o., female Today's Date: 08/13/2023   PCP: Madaline Scales REFERRING PROVIDER: Devora Folks  END OF SESSION:  PT End of Session - 08/13/23 1058     Visit Number 7    Number of Visits 24    Date for PT Re-Evaluation 09/17/23    PT Start Time 1100    PT Stop Time 1141    PT Time Calculation (min) 41 min    Equipment Utilized During Treatment Gait belt    Activity Tolerance Patient tolerated treatment well;Patient limited by fatigue    Behavior During Therapy WFL for tasks assessed/performed                   Past Medical History:  Diagnosis Date   Bladder retention    Complication of anesthesia    ? seizures after anesthesia    Depression    Diarrhea due to malabsorption    Dizziness    Headache    Migraines    Neurogenic bladder    Pyelonephritis    Renal disorder    Vision abnormalities    Weight loss    Past Surgical History:  Procedure Laterality Date   ANTERIOR CRUCIATE LIGAMENT REPAIR  1997   APPENDECTOMY     BLADDER REMOVAL     COLONOSCOPY WITH PROPOFOL  N/A 11/11/2018   Procedure: COLONOSCOPY WITH PROPOFOL ;  Surgeon: Deveron Fly, MD;  Location: [facility] ENDOSCOPY;  Service: Endoscopy;  Laterality: N/A;   CYSTOSCOPY WITH STENT PLACEMENT Right 04/17/2016   Procedure: CYSTOSCOPY WITH STENT PLACEMENT;  Surgeon: Marco Severs, MD;  Location: [facility] ORS;  Service: Urology;  Laterality: Right;   ESOPHAGOGASTRODUODENOSCOPY (EGD) WITH PROPOFOL  N/A 11/11/2018   Procedure: ESOPHAGOGASTRODUODENOSCOPY (EGD) WITH PROPOFOL ;  Surgeon: Deveron Fly, MD;  Location: [facility] ENDOSCOPY;  Service: Endoscopy;  Laterality: N/A;   EXPLORATORY LAPAROTOMY  1999   HIP SURGERY Right    KIDNEY STONE SURGERY  04/2016   REVISION UROSTOMY CUTANEOUS     REVISION UROSTOMY CUTANEOUS  01/10/2018   SUPRAPUBIC CATHETER PLACEMENT  08/2017   TONSILLECTOMY      Patient Active Problem List   Diagnosis Date Noted   Stroke-like symptoms 06/13/2023   Refractory nausea and vomiting 02/22/2023   Syncope and collapse 02/19/2023   Palliative care by specialist 02/19/2023   UTI (urinary tract infection) 02/13/2023   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   Neurogenic bladder 02/13/2023   Hepatic steatosis 02/13/2023   Wheelchair dependent 02/13/2023   Secondary adrenal insufficiency (HCC) 02/13/2023   Right flank pain 02/13/2023   Iron deficiency anemia 10/02/2022   Seizure (HCC) 11/11/2018   Major depressive disorder, recurrent episode, moderate (HCC) 02/07/2018   Nephrolithiasis 04/16/2016   Numbness 07/28/2015   Bladder retention 06/23/2015   Abdominal pain 06/04/2015   Dizziness 05/18/2015   Neck pain 05/18/2015   Complicated migraine 04/28/2015   Other fatigue 04/28/2015   Abnormal finding on MRI of brain 04/28/2015   Diarrhea 03/29/2015   H/O disease 03/29/2015   Abnormal weight loss 03/29/2015   Muscle weakness (generalized) 03/14/2015   Headache, migraine 03/10/2015    ONSET DATE: 06/13/23  REFERRING DIAG: CVA  THERAPY DIAG:  Muscle weakness (generalized)  Unsteadiness on feet  Difficulty in walking, not elsewhere classified  Rationale for Evaluation and Treatment: Rehabilitation  SUBJECTIVE:  SUBJECTIVE STATEMENT: Patient presents with flank pain, going to see physician later today.  Pt accompanied by: significant other  PERTINENT HISTORY:  Patient is referred to PT s/p CVA.  Patient presents to PT s/p CVA on 06/13/23.  Pmh includes adrenal insufficiency, Hereditary spastic paraplegia (HSP), small fiber neuropathy c/b BLE paraplegia and neurogenic bladder s/p cystectomy w/ ileal condiuit (05/2022), chronic pain, migraine. Patient  was recently hospitalized prior to this at [facility] from 04/30/23 to 05/27/23. Patient reports L sided weakness. Is having stabbing pains for a few minutes.   PAIN:  Are you having pain?  Has been having sharp stabbing pains  PRECAUTIONS: Fall  RED FLAGS: None and hx of bladder removal      WEIGHT BEARING RESTRICTIONS: No  FALLS: Has patient fallen in last 6 months? Yes. Number of falls 3  LIVING ENVIRONMENT: Lives with: lives with their family and lives with their spouse Lives in: House/apartment Stairs:  ramp Has following equipment at home: Wheelchair (manual) and Ramped entry  PLOF: Independent with household mobility with device  PATIENT GOALS: to get stronger, do ADLs better.   OBJECTIVE:  Note: Objective measures were completed at Evaluation unless otherwise noted.  DIAGNOSTIC FINDINGS:  MRI results received from [facility] "1. 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 2. Adjacent small acute infarct within the posterior right frontal lobe white matter. 3. Background moderate multifocal T2 FLAIR hyperintense signal abnormality within the cerebral white matter (with a subcortical white matter predominance), greater than expected for age. These signal changes are nonspecific and differential considerations include accelerated chronic small vessel ischemic disease, sequelae of chronic migraine headaches, sequelae of vasculitis/hypercoagulable state and sequelae of a prior infectious/inflammatory process, among others.   MRA head:   Significantly motion degraded examination. No proximal intracranial large vessel occlusion is identified. Motion artifact precludes accurate assessment for stenoses and for vessel irregularity.    COGNITION: Overall cognitive status: Within functional limits for tasks assessed   SENSATION: Light touch: Impaired ; below R knee has decreased sensation    COORDINATION: Heel shin: tonic shaking movements    MUSCLE TONE: LLE: Moderate RLE Moderate   POSTURE: forward head, increased thoracic kyphosis, posterior pelvic tilt, and flexed trunk     LOWER EXTREMITY MMT:    MMT Right Eval Left Eval  Hip flexion 6.2 5.6  Hip extension    Hip abduction 6.5 6.4  Hip adduction 3.4 3.4  Hip internal rotation    Hip external rotation    Knee flexion 5.1 1.0  Knee extension 7.1 3.2  Ankle dorsiflexion    Ankle plantarflexion    Ankle inversion    Ankle eversion    (Blank rows = not tested)  BED MOBILITY:  Will assess in future session.  TRANSFERS: Assistive device utilized: Environmental consultant - 4 wheeled  Sit to stand: CGA Stand to sit: CGA Chair to chair: Min A     GAIT: Gait pattern: step to pattern, knee flexed in stance- Right, knee flexed in stance- Left, ataxic, trunk flexed, poor foot clearance- Right, and poor foot clearance- Left Distance walked: 8 ft  Assistive device utilized: Walker - 4 wheeled Level of assistance: CGA and Min A Comments: Jerking tonic  gait pattern   FUNCTIONAL TESTS:  5 times sit to stand: 46.33 with hands  10 meter walk test: 1.04 for 8 ft; unable to finish  PATIENT SURVEYS:  Stroke Impact Scale give next session  TREATMENT DATE: 08/13/23    TherAct: Static stand at // bar 60 seconds x 2 trials Airex pad: --static stand 60 seconds -march 10x each LE Sit to stand 10x; 5x then seated rest then 5x  Stand pivot transfer to/from wheelchair to/from table  TherEx Supine: Hamstring stretch 60 seconds each LE Single knee to chest 60 seconds each LE Figure four position 60 seconds each LE Thoracic rotation twists 10x;30 second holds at final rotation  Posterior pelvic tilt 10x BTB abduction 15x; 2 sets BTB march 15x; 2 sets Heel slides 10x each LE  Seated: LAQ 10x BTB  hamstring curl 15x each LE   PATIENT EDUCATION: Education details: goals, POC  Person educated: Patient Education method: Explanation, Demonstration, Tactile cues, and Verbal cues Education comprehension: verbalized understanding, returned demonstration, verbal cues required, and tactile cues required  HOME EXERCISE PROGRAM: Access Code: [email or web address] URL: [email or web address] Date: 06/25/2023 Prepared by: Leshon Armistead  Exercises - Leg Extension  - 1 x daily - 7 x weekly - 2 sets - 10 reps - 5 hold - Seated March  - 1 x daily - 7 x weekly - 2 sets - 10 reps - 5 hold - Seated Hip Adduction Isometrics with Ball  - 1 x daily - 7 x weekly - 2 sets - 10 reps - 5 hold - Seated Heel Toe Raises  - 1 x daily - 7 x weekly - 2 sets - 10 reps - 5 hold  GOALS: Goals reviewed with patient? Yes  SHORT TERM GOALS: Target date: 07/23/2023    Patient will be independent in home exercise program to improve strength/mobility for better functional independence with ADLs. Baseline: 3/10: give next session Goal status: INITIAL    LONG TERM GOALS: Target date: 09/17/2023  Patient (< 30 years old) will complete five times sit to stand test in < 10 seconds indicating an increased LE strength and improved balance. Baseline: 3/10: 46.33 Goal status: INITIAL  2.  Patient will perform 10 MWT under 2 minutes with no rest breaks indicating improved functional ambulation. Baseline: 3/10: 8 ft in 1 min 4 seconds Goal status: INITIAL  3.  Patient will tolerate standing 3 minutes for ADL performance Baseline: 7/84: knee buckling in standing Goal status: INITIAL  4.  Patient will increase BLE gross strength to 4+/5 and/or within 1lb of each limb  as to improve functional strength for independent gait, increased standing tolerance and increased ADL ability. Baseline: 3/10: see above  Goal status: INITIAL    ASSESSMENT:  CLINICAL IMPRESSION: Patient is able to stand on airex pad this session.  She has significant buckling and ataxia in standing.   Patient is eager to progress his standing functional mobility at this time. Her back pain continues to be present through session. Patient will benefit from skilled physical therapy to increase functional mobility, strength, and independence with ADLs.   OBJECTIVE IMPAIRMENTS: Abnormal gait, decreased activity tolerance, decreased balance, decreased coordination, decreased endurance, decreased mobility, difficulty walking, decreased strength, dizziness, impaired perceived functional ability, increased muscle spasms, impaired flexibility, improper body mechanics, and postural dysfunction.   ACTIVITY LIMITATIONS: carrying, lifting, bending, sitting, standing, squatting, sleeping, stairs, transfers, bed mobility, bathing, toileting, dressing, reach over head, hygiene/grooming, locomotion level, and caring for others  PARTICIPATION LIMITATIONS: meal prep, cleaning, laundry, interpersonal relationship, driving, shopping, community activity, yard work, and church  PERSONAL FACTORS: Age, Past/current experiences, Time since onset of injury/illness/exacerbation, Transportation, and 3+ comorbidities: adrenal insufficiency, Hereditary spastic paraplegia (HSP), small fiber neuropathy c/b BLE paraplegia  and neurogenic bladder s/p cystectomy w/ ileal condiuit (05/2022), chronic pain, migraine  are also affecting patient's functional outcome.   REHAB POTENTIAL: Good  CLINICAL DECISION MAKING: Evolving/moderate complexity  EVALUATION COMPLEXITY: Moderate  PLAN:  PT FREQUENCY: 2x/week  PT DURATION: 12 weeks  PLANNED INTERVENTIONS: 97164- PT Re-evaluation, 97110-Therapeutic exercises, 97530- Therapeutic activity, 97112- Neuromuscular re-education, 97535- Self Care, 95284- Manual therapy, [id number]- Gait training, [phone]- Orthotic Fit/training, [phone]- Canalith repositioning, [id number]- Splinting, [id number]- Electrical stimulation (unattended), [phone]- Electrical  stimulation (manual), [id number]- Vasopneumatic device, [id number]- Ultrasound, [id number]- Traction (mechanical), [id number]- Ionotophoresis 4mg /ml Dexamethasone , Patient/Family education, Balance training, Stair training, Taping, Dry Needling, Joint mobilization, Joint manipulation, Spinal manipulation, Spinal mobilization, Scar mobilization, Compression bandaging, Vestibular training, Visual/preceptual remediation/compensation, DME instructions, Wheelchair mobility training, Cryotherapy, Moist heat, and Biofeedback  PLAN FOR NEXT SESSION: strengthening, balance    Jamel Dunton, PT 08/13/2023, 12:43 PM

## 2023-08-13 ENCOUNTER — Ambulatory Visit: Admitting: Occupational Therapy

## 2023-08-13 ENCOUNTER — Ambulatory Visit: Payer: BC Managed Care – PPO

## 2023-08-13 DIAGNOSIS — R262 Difficulty in walking, not elsewhere classified: Secondary | ICD-10-CM

## 2023-08-13 DIAGNOSIS — R2681 Unsteadiness on feet: Secondary | ICD-10-CM

## 2023-08-13 DIAGNOSIS — M6281 Muscle weakness (generalized): Secondary | ICD-10-CM

## 2023-08-13 NOTE — Therapy (Signed)
 OUTPATIENT OCCUPATIONAL THERAPY NEURO TREATMENT NOTE  Patient Name: Christine Chavez MRN: [id number] DOB:12-12-1980, 43 y.o., female Today's Date: 08/13/2023  PCP: Madaline Scales, PA-C REFERRING PROVIDER: Rosan Comfort, MD  END OF SESSION:  OT End of Session - 08/13/23 1516     Visit Number 6    Number of Visits 24    Date for OT Re-Evaluation 09/19/23    OT Start Time 1145    OT Stop Time 1230    OT Time Calculation (min) 45 min    Activity Tolerance Patient tolerated treatment well    Behavior During Therapy WFL for tasks assessed/performed             Past Medical History:  Diagnosis Date   Bladder retention    Complication of anesthesia    ? seizures after anesthesia    Depression    Diarrhea due to malabsorption    Dizziness    Headache    Migraines    Neurogenic bladder    Pyelonephritis    Renal disorder    Vision abnormalities    Weight loss    Past Surgical History:  Procedure Laterality Date   ANTERIOR CRUCIATE LIGAMENT REPAIR  1997   APPENDECTOMY     BLADDER REMOVAL     COLONOSCOPY WITH PROPOFOL  N/A 11/11/2018   Procedure: COLONOSCOPY WITH PROPOFOL ;  Surgeon: Deveron Fly, MD;  Location: [facility] ENDOSCOPY;  Service: Endoscopy;  Laterality: N/A;   CYSTOSCOPY WITH STENT PLACEMENT Right 04/17/2016   Procedure: CYSTOSCOPY WITH STENT PLACEMENT;  Surgeon: Marco Severs, MD;  Location: [facility] ORS;  Service: Urology;  Laterality: Right;   ESOPHAGOGASTRODUODENOSCOPY (EGD) WITH PROPOFOL  N/A 11/11/2018   Procedure: ESOPHAGOGASTRODUODENOSCOPY (EGD) WITH PROPOFOL ;  Surgeon: Deveron Fly, MD;  Location: [facility] ENDOSCOPY;  Service: Endoscopy;  Laterality: N/A;   EXPLORATORY LAPAROTOMY  1999   HIP SURGERY Right    KIDNEY STONE SURGERY  04/2016   REVISION UROSTOMY CUTANEOUS     REVISION UROSTOMY CUTANEOUS  01/10/2018   SUPRAPUBIC CATHETER PLACEMENT  08/2017   TONSILLECTOMY     Patient Active Problem List   Diagnosis Date Noted   Stroke-like  symptoms 06/13/2023   Refractory nausea and vomiting 02/22/2023   Syncope and collapse 02/19/2023   Palliative care by specialist 02/19/2023   UTI (urinary tract infection) 02/13/2023   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   Neurogenic bladder 02/13/2023   Hepatic steatosis 02/13/2023   Wheelchair dependent 02/13/2023   Secondary adrenal insufficiency (HCC) 02/13/2023   Right flank pain 02/13/2023   Iron deficiency anemia 10/02/2022   Seizure (HCC) 11/11/2018   Major depressive disorder, recurrent episode, moderate (HCC) 02/07/2018   Nephrolithiasis 04/16/2016   Numbness 07/28/2015   Bladder retention 06/23/2015   Abdominal pain 06/04/2015   Dizziness 05/18/2015   Neck pain 05/18/2015   Complicated migraine 04/28/2015   Other fatigue 04/28/2015   Abnormal finding on MRI of brain 04/28/2015   Diarrhea 03/29/2015   H/O disease 03/29/2015   Abnormal weight loss 03/29/2015   Muscle weakness (generalized) 03/14/2015   Headache, migraine 03/10/2015    ONSET DATE: 06/13/23  REFERRING DIAG: CVA, HSP  THERAPY DIAG:  Muscle weakness (generalized)  Rationale for Evaluation and Treatment: Rehabilitation  SUBJECTIVE:   SUBJECTIVE STATEMENT:  Pt. reports having a very busy week with her children's events, and multiple medical appointments.   Pt accompanied by: significant other Husband: Paul  PERTINENT HISTORY:   Pt. is a 43 y.o. female who was hospitalized with a CVA  from 06/13/23-06/14/23.  Imaging revealed two small acute cortical infarcts within the posterior Right frontal lobe (with involvement in the motor strip). PMHx includes: HSP With a  recent hospitalization from January 13-February 19th at [facility] 2/2 a Neuro Flare from the HSP. Hx of small fiber neuropathy d/t BLE paraplegia, Neurogenic bladder s/p cystectomy with ideal conduit (2/24) ,chronic pain, migraine. Pt. Reports multiple hospitalizations of the past year.  PRECAUTIONS: None  WEIGHT BEARING  RESTRICTIONS: No  PAIN:  Are you having pain? No reports of Pain, however reports not feeling well today -is planning to sees her primary care physician today.  FALLS: Has patient fallen in last 6 months? Yes. Number of falls 3  LIVING ENVIRONMENT: Lives with: lives with their family Lives in: House/apartment, One level Stairs:  ramp. Has following equipment at home: Otho Blitz - 4 wheeled, Wheelchair (power), Wheelchair (manual), Shower bench, and bed side commode, bed rail  PLOF: Independent  PATIENT GOALS:  Regain strength, and hand coordination  OBJECTIVE:  Note: Objective measures were completed at Evaluation unless otherwise noted.  HAND DOMINANCE: Right  ADLs: Overall ADLs:  Transfers/ambulation related to ADLs: Eating:  Independent, Cutting. Difficulty opening  Grooming: Independent UB Dressing: Independent LB Dressing: Independent, set-up Toileting:  Independent, Ostomy Bag Bathing: Independent once in the shower            Shower transfers: Husband assists with shower transfers.   IADLs: Shopping: Orders online. Husband picks them up Light housekeeping: Family assists Meal Prep: Engineer, structural, Assist with meals, cutting Community mobility: Relies on family, and friends Medication management: Independent Financial management: No change Handwriting: 100% legible, Pain/fatigues with writing note cards. Hobbies: Making gifts baskets; shopping ([employer] is a favorite)  MOBILITY STATUS: Uses a w/c  POSTURE COMMENTS:   Sitting balance:  WFL  ACTIVITY TOLERANCE: Activity tolerance: limited  FUNCTIONAL OUTCOME MEASURES: MAM-20: Sum score: 73/80; MAM Measure score: 71.8  UPPER EXTREMITY ROM:    Active ROM Right Eval WFL Left Eval [facility]  Shoulder flexion    Shoulder abduction    Shoulder adduction    Shoulder extension    Shoulder internal rotation    Shoulder external rotation    Elbow flexion    Elbow extension    Wrist flexion    Wrist extension    Wrist  ulnar deviation    Wrist radial deviation    Wrist pronation    Wrist supination    (Blank rows = not tested)  UPPER EXTREMITY MMT:     MMT Right eval Left eval  Shoulder flexion 3+/5 3+/5  Shoulder abduction 3+/5 3+/5  Shoulder adduction    Shoulder extension    Shoulder internal rotation    Shoulder external rotation    Middle trapezius    Lower trapezius    Elbow flexion 3+/5 3+/5  Elbow extension 3+/5 3+/5  Wrist flexion    Wrist extension 3+/5 3+/5  Wrist ulnar deviation    Wrist radial deviation    Wrist pronation 3+/5 3+/5  Wrist supination 3+/5 3+/5  (Blank rows = not tested)  HAND FUNCTION: Grip strength: Right: 5 lbs; Left: 2 lbs and Lateral pinch: Right: 5 lbs, Left: 2 lbs, 3pt. Pinch: R: 5 lbs, L: 2 lbs; 2pt. Pinch: R: 1 lbs, L: 1lb  COORDINATION: 9 Hole Peg test: Right: 25 sec; Left: 31 sec  SENSATION: WFL light touch, and proprioceptive awareness  EDEMA: N/A   COGNITION: Overall cognitive status: Within functional limits for tasks assessed  VISION: Subjective report:  Reports blurriness  PERCEPTION: WFL  PRAXIS: WFL                                                                                                                           TREATMENT DATE: 08/13/2023    Neuromuscular re-education:  -Facilitated [facility] skills focused on manipulating nuts, and bolts on a bolt board. Emphasis was placed on screwing, and unscrewing nuts and bolts, and progressively working from larger to smaller items.  -Facilitated [facility] skills focused on left hand motor control, and [facility] skills manipulating a Klask 1/2"  thick control magnet with vision occluded, while guiding a 1/4" magnet with it through mazes of progressively increasing degrees of complexity.  -Facilitated left hand [facility] skills grasping extra small-mini beads, and placing them onto a small Paper clip sized dowel. Pt. worked on removing them from the dowel while alternating thumb opposition from the 2nd  through 5th digits.  -Facilitated left hand [facility] skills, and translatory movements grasping mini beads, storing them in the palm of her hand, and moving them from her palm to the tip of the 2nd digit, and thumb.            PATIENT EDUCATION: Education details: OT services, POC, goals, Pt. Functional status Person educated: Patient and Spouse Education method: Demonstration and Verbal cues,verbal cues Education comprehension: verbalized understanding and needs further education  HOME EXERCISE PROGRAM:  Continue ongoing assessment, and provide as indicated   GOALS: Goals reviewed with patient? Yes  SHORT TERM GOALS: Target date: 08/08/2023    Pt. Will be independent with HEPs for BUEs. Baseline:No current HEP Goal status: INITIAL   LONG TERM GOALS: Target date: 09/19/2023    Pt. Will be improve BUE strength by 2 mm grades to be able to independently reach for ADL/IADL items in multiple planes. Baseline: Eval: BUE strength: 3+/5 overall  Goal status: INITIAL  2.  Pt. will improve bilateral grip strength to be able to securely hold items. Baseline: Eval: R: 5#, L: 2# Goal status: INITIAL  3.  Pt. Will improve bilateral lateral pinch strength by 3#  to be able to open medication bottles Baseline: Eval: R: 5#, L: 2# Goal status: INITIAL  4.  Pt. Will improve bilateral 3pt. pinch strength to be able to independently use a nail clipper Baseline: Eval: R: 3#, L: 2# Goal status: INITIAL  5.  Pt. Will will improve lef [facility] skills to be able to manipulate small objects. Baseline: Eval: R: 25 sec., L: 31 sec. Goal status: INITIAL  ASSESSMENT:  CLINICAL IMPRESSION:  Pt. reports not feeling well today, however sees her primary care physician this afternoon. Pt. reports having multiple medical appointments this week. Pt. continues to progress with engaging her hands during daily tasks. Pt. presented with difficulty using her left hand to reconnect the 1" nuts to the bolts. Pt.  was able to efficiently reconnect the smaller ones.  Pt.'s left hand fatigues when guiding the Klask magnets through the mazes.  Pt. dropped the extra small beads minimally when grasping them, and performing translatory movements. Pt. will benefit from OT services to work on improving BUE functioning in order to improve engagement in, and participation in ADL, and IADL functioning.  PERFORMANCE DEFICITS: in functional skills including ADLs, IADLs, coordination, dexterity, proprioception, ROM, strength, Fine motor control, Gross motor control, and UE functional use, cognitive skills including , and psychosocial skills including environmental adaptation, routines, and behaviors.   IMPAIRMENTS: are limiting patient from ADLs, IADLs, and leisure.   CO-MORBIDITIES: may have co-morbidities  that affects occupational performance. Patient will benefit from skilled OT to address above impairments and improve overall function.  MODIFICATION OR ASSISTANCE TO COMPLETE EVALUATION: Min-Moderate modification of tasks or assist with assess necessary to complete an evaluation.  OT OCCUPATIONAL PROFILE AND HISTORY: Detailed assessment: Review of records and additional review of physical, cognitive, psychosocial history related to current functional performance.  CLINICAL DECISION MAKING: Moderate - several treatment options, min-mod task modification necessary  REHAB POTENTIAL: Good  EVALUATION COMPLEXITY: Moderate    PLAN:  OT FREQUENCY: 2x/week  OT DURATION: 12 weeks  PLANNED INTERVENTIONS: 97168 OT Re-evaluation, 97535 self care/ADL training, 29528 therapeutic exercise, 97530 therapeutic activity, 97112 neuromuscular re-education, 97018 paraffin, 41324 contrast bath, 97760 Orthotics management and training, 40102 Splinting (initial encounter), functional mobility training, patient/family education, and DME and/or AE instructions  RECOMMENDED OTHER SERVICES: PT  CONSULTED AND AGREED WITH PLAN OF CARE:  Patient and family member/caregiver  PLAN FOR NEXT SESSION: Treatment   Duey Ghent, MS, OTR/L  08/13/2023, 3:21 PM

## 2023-08-15 ENCOUNTER — Ambulatory Visit: Admitting: Occupational Therapy

## 2023-08-15 ENCOUNTER — Ambulatory Visit: Payer: BC Managed Care – PPO

## 2023-08-15 ENCOUNTER — Ambulatory Visit: Admitting: Physical Therapy

## 2023-08-15 DIAGNOSIS — M25551 Pain in right hip: Secondary | ICD-10-CM

## 2023-08-15 DIAGNOSIS — M6281 Muscle weakness (generalized): Secondary | ICD-10-CM

## 2023-08-15 DIAGNOSIS — R278 Other lack of coordination: Secondary | ICD-10-CM

## 2023-08-15 DIAGNOSIS — R262 Difficulty in walking, not elsewhere classified: Secondary | ICD-10-CM

## 2023-08-15 DIAGNOSIS — R2681 Unsteadiness on feet: Secondary | ICD-10-CM

## 2023-08-15 DIAGNOSIS — M25552 Pain in left hip: Secondary | ICD-10-CM

## 2023-08-15 DIAGNOSIS — M62838 Other muscle spasm: Secondary | ICD-10-CM

## 2023-08-15 NOTE — Therapy (Signed)
 OUTPATIENT OCCUPATIONAL THERAPY NEURO TREATMENT NOTE  Patient Name: Christine Chavez MRN: [id number] DOB:[birth year], 43 y.o., female Today's Date: 08/15/2023  PCP: Madaline Scales, PA-C REFERRING PROVIDER: Rosan Comfort, MD  END OF SESSION:  OT End of Session - 08/15/23 1106     Visit Number 7    Number of Visits 24    Date for OT Re-Evaluation 09/19/23    OT Start Time 0937    OT Stop Time 1015    OT Time Calculation (min) 38 min    Activity Tolerance Patient tolerated treatment well    Behavior During Therapy WFL for tasks assessed/performed             Past Medical History:  Diagnosis Date   Bladder retention    Complication of anesthesia    ? seizures after anesthesia    Depression    Diarrhea due to malabsorption    Dizziness    Headache    Migraines    Neurogenic bladder    Pyelonephritis    Renal disorder    Vision abnormalities    Weight loss    Past Surgical History:  Procedure Laterality Date   ANTERIOR CRUCIATE LIGAMENT REPAIR  1997   APPENDECTOMY     BLADDER REMOVAL     COLONOSCOPY WITH PROPOFOL  N/A 11/11/2018   Procedure: COLONOSCOPY WITH PROPOFOL ;  Surgeon: Deveron Fly, MD;  Location: [facility] ENDOSCOPY;  Service: Endoscopy;  Laterality: N/A;   CYSTOSCOPY WITH STENT PLACEMENT Right 04/17/2016   Procedure: CYSTOSCOPY WITH STENT PLACEMENT;  Surgeon: Marco Severs, MD;  Location: [facility] ORS;  Service: Urology;  Laterality: Right;   ESOPHAGOGASTRODUODENOSCOPY (EGD) WITH PROPOFOL  N/A 11/11/2018   Procedure: ESOPHAGOGASTRODUODENOSCOPY (EGD) WITH PROPOFOL ;  Surgeon: Deveron Fly, MD;  Location: [facility] ENDOSCOPY;  Service: Endoscopy;  Laterality: N/A;   EXPLORATORY LAPAROTOMY  1999   HIP SURGERY Right    KIDNEY STONE SURGERY  04/2016   REVISION UROSTOMY CUTANEOUS     REVISION UROSTOMY CUTANEOUS  01/10/2018   SUPRAPUBIC CATHETER PLACEMENT  08/2017   TONSILLECTOMY     Patient Active Problem List   Diagnosis Date Noted   Stroke-like  symptoms 06/13/2023   Refractory nausea and vomiting 02/22/2023   Syncope and collapse 02/19/2023   Palliative care by specialist 02/19/2023   UTI (urinary tract infection) 02/13/2023   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   Neurogenic bladder 02/13/2023   Hepatic steatosis 02/13/2023   Wheelchair dependent 02/13/2023   Secondary adrenal insufficiency (HCC) 02/13/2023   Right flank pain 02/13/2023   Iron deficiency anemia 10/02/2022   Seizure (HCC) 11/11/2018   Major depressive disorder, recurrent episode, moderate (HCC) 02/07/2018   Nephrolithiasis 04/16/2016   Numbness 07/28/2015   Bladder retention 06/23/2015   Abdominal pain 06/04/2015   Dizziness 05/18/2015   Neck pain 05/18/2015   Complicated migraine 04/28/2015   Other fatigue 04/28/2015   Abnormal finding on MRI of brain 04/28/2015   Diarrhea 03/29/2015   H/O disease 03/29/2015   Abnormal weight loss 03/29/2015   Muscle weakness (generalized) 03/14/2015   Headache, migraine 03/10/2015    ONSET DATE: 06/13/23  REFERRING DIAG: CVA, HSP  THERAPY DIAG:  Muscle weakness (generalized)  Other lack of coordination  Rationale for Evaluation and Treatment: Rehabilitation  SUBJECTIVE:   SUBJECTIVE STATEMENT:  Pt. reports doing okay today.   Pt accompanied by: significant other Husband: Paul  PERTINENT HISTORY:   Pt. is a 43 y.o. female who was hospitalized with a CVA from 06/13/23-06/14/23.  Imaging revealed  two small acute cortical infarcts within the posterior Right frontal lobe (with involvement in the motor strip). PMHx includes: HSP With a  recent hospitalization from January 13-February 19th at [facility] 2/2 a Neuro Flare from the HSP. Hx of small fiber neuropathy d/t BLE paraplegia, Neurogenic bladder s/p cystectomy with ideal conduit (2/24) ,chronic pain, migraine. Pt. Reports multiple hospitalizations of the past year.  PRECAUTIONS: None  WEIGHT BEARING RESTRICTIONS: No  PAIN:  Are you having  pain? No reports of Pain, however reports not feeling well today -is planning to sees her primary care physician today.  FALLS: Has patient fallen in last 6 months? Yes. Number of falls 3  LIVING ENVIRONMENT: Lives with: lives with their family Lives in: House/apartment, One level Stairs:  ramp. Has following equipment at home: Otho Blitz - 4 wheeled, Wheelchair (power), Wheelchair (manual), Shower bench, and bed side commode, bed rail  PLOF: Independent  PATIENT GOALS:  Regain strength, and hand coordination  OBJECTIVE:  Note: Objective measures were completed at Evaluation unless otherwise noted.  HAND DOMINANCE: Right  ADLs: Overall ADLs:  Transfers/ambulation related to ADLs: Eating:  Independent, Cutting. Difficulty opening  Grooming: Independent UB Dressing: Independent LB Dressing: Independent, set-up Toileting:  Independent, Ostomy Bag Bathing: Independent once in the shower            Shower transfers: Husband assists with shower transfers.   IADLs: Shopping: Orders online. Husband picks them up Light housekeeping: Family assists Meal Prep: Engineer, structural, Assist with meals, cutting Community mobility: Relies on family, and friends Medication management: Independent Financial management: No change Handwriting: 100% legible, Pain/fatigues with writing note cards. Hobbies: Making gifts baskets; shopping ([employer] is a favorite)  MOBILITY STATUS: Uses a w/c  POSTURE COMMENTS:   Sitting balance:  WFL  ACTIVITY TOLERANCE: Activity tolerance: limited  FUNCTIONAL OUTCOME MEASURES: MAM-20: Sum score: 73/80; MAM Measure score: 71.8  UPPER EXTREMITY ROM:    Active ROM Right Eval WFL Left Eval [facility]  Shoulder flexion    Shoulder abduction    Shoulder adduction    Shoulder extension    Shoulder internal rotation    Shoulder external rotation    Elbow flexion    Elbow extension    Wrist flexion    Wrist extension    Wrist ulnar deviation    Wrist radial deviation     Wrist pronation    Wrist supination    (Blank rows = not tested)  UPPER EXTREMITY MMT:     MMT Right eval Left eval  Shoulder flexion 3+/5 3+/5  Shoulder abduction 3+/5 3+/5  Shoulder adduction    Shoulder extension    Shoulder internal rotation    Shoulder external rotation    Middle trapezius    Lower trapezius    Elbow flexion 3+/5 3+/5  Elbow extension 3+/5 3+/5  Wrist flexion    Wrist extension 3+/5 3+/5  Wrist ulnar deviation    Wrist radial deviation    Wrist pronation 3+/5 3+/5  Wrist supination 3+/5 3+/5  (Blank rows = not tested)  HAND FUNCTION: Grip strength: Right: 5 lbs; Left: 2 lbs and Lateral pinch: Right: 5 lbs, Left: 2 lbs, 3pt. Pinch: R: 5 lbs, L: 2 lbs; 2pt. Pinch: R: 1 lbs, L: 1lb  COORDINATION: 9 Hole Peg test: Right: 25 sec; Left: 31 sec  SENSATION: WFL light touch, and proprioceptive awareness  EDEMA: N/A   COGNITION: Overall cognitive status: Within functional limits for tasks assessed  VISION: Subjective report: Reports blurriness  PERCEPTION: [facility]  PRAXIS: WFL                                                                                                                           TREATMENT DATE: 08/15/2023   Therapeutic Ex.:   -Performed BUE strengthening using a 1.5# dowel ex. 2/2 to weakness. Bilateral shoulder flexion, chest press, circular patterns, and elbow flexion/extension for 1 set  10 reps each.  -Performed BUE strengthening using 3# hand weight for elbow flexion, and extension, forearm supination/pronation, 2#wrist flexion/extension, and radial deviation.   Neuromuscular re-education:   -Facilitated left hand [facility] skills grasping small 1/2" circular tipped pegs from a shallow dish, performing translatory movements moving them from the palm to the tip of the 2nd digit, and thumb in preparation for placing them into a small pegboard.   PATIENT EDUCATION: Education details: UE functioning, strengthening Person educated:  Patient and Spouse Education method: Museum/gallery conservator cues,verbal cues Education comprehension: verbalized understanding and needs further education  HOME EXERCISE PROGRAM:  Continue ongoing assessment, and provide as indicated   GOALS: Goals reviewed with patient? Yes  SHORT TERM GOALS: Target date: 08/08/2023    Pt. Will be independent with HEPs for BUEs. Baseline:No current HEP Goal status: INITIAL   LONG TERM GOALS: Target date: 09/19/2023    Pt. Will be improve BUE strength by 2 mm grades to be able to independently reach for ADL/IADL items in multiple planes. Baseline: Eval: BUE strength: 3+/5 overall  Goal status: INITIAL  2.  Pt. will improve bilateral grip strength to be able to securely hold items. Baseline: Eval: R: 5#, L: 2# Goal status: INITIAL  3.  Pt. Will improve bilateral lateral pinch strength by 3#  to be able to open medication bottles Baseline: Eval: R: 5#, L: 2# Goal status: INITIAL  4.  Pt. Will improve bilateral 3pt. pinch strength to be able to independently use a nail clipper Baseline: Eval: R: 3#, L: 2# Goal status: INITIAL  5.  Pt. Will will improve lef [facility] skills to be able to manipulate small objects. Baseline: Eval: R: 25 sec., L: 31 sec. Goal status: INITIAL  ASSESSMENT:  CLINICAL IMPRESSION:  Pt. tolerated BUE strengthening exercises with reports of more fatigue with the overhead shoulder flexion exercises. Pt. was able to tolerate progression to 3# hand weights. Pt. was able to grasp, and store the pegs in the palm of her right hand, however dropped multiple pegs when performing translatory movements with the hand moving them from the palm to the tip of the 2nd digit, and thumb. Pt. continues to benefit from OT services to work on improving BUE functioning in order to improve engagement in, and participation in ADL, and IADL functioning.  PERFORMANCE DEFICITS: in functional skills including ADLs, IADLs, coordination, dexterity,  proprioception, ROM, strength, Fine motor control, Gross motor control, and UE functional use, cognitive skills including , and psychosocial skills including environmental adaptation, routines, and behaviors.   IMPAIRMENTS: are limiting patient from ADLs,  IADLs, and leisure.   CO-MORBIDITIES: may have co-morbidities  that affects occupational performance. Patient will benefit from skilled OT to address above impairments and improve overall function.  MODIFICATION OR ASSISTANCE TO COMPLETE EVALUATION: Min-Moderate modification of tasks or assist with assess necessary to complete an evaluation.  OT OCCUPATIONAL PROFILE AND HISTORY: Detailed assessment: Review of records and additional review of physical, cognitive, psychosocial history related to current functional performance.  CLINICAL DECISION MAKING: Moderate - several treatment options, min-mod task modification necessary  REHAB POTENTIAL: Good  EVALUATION COMPLEXITY: Moderate    PLAN:  OT FREQUENCY: 2x/week  OT DURATION: 12 weeks  PLANNED INTERVENTIONS: 97168 OT Re-evaluation, 97535 self care/ADL training, 16109 therapeutic exercise, 97530 therapeutic activity, 97112 neuromuscular re-education, 97018 paraffin, 60454 contrast bath, 97760 Orthotics management and training, 09811 Splinting (initial encounter), functional mobility training, patient/family education, and DME and/or AE instructions  RECOMMENDED OTHER SERVICES: PT  CONSULTED AND AGREED WITH PLAN OF CARE: Patient and family member/caregiver  PLAN FOR NEXT SESSION: Treatment  Duey Ghent, MS, OTR/L  08/15/2023, 11:09 AM

## 2023-08-15 NOTE — Patient Instructions (Signed)
 Abdominal massage upward from L,  R , center to belly button 3 stroke x 3 , pressure is gentle and light with all fingers flat, not using finger tips

## 2023-08-15 NOTE — Therapy (Signed)
 OUTPATIENT PHYSICAL THERAPY NEURO TREATMENT / RECERT    Patient Name: Christine Chavez MRN: [id number] DOB:07/25/1980, 43 y.o., female Today's Date: 08/15/2023   PCP: Madaline Scales REFERRING PROVIDER: Devora Folks  END OF SESSION:  PT End of Session - 08/15/23 0902     Visit Number 8    Number of Visits 36    Date for PT Re-Evaluation 11/07/23    PT Start Time 0850    PT Stop Time 0940    PT Time Calculation (min) 50 min    Equipment Utilized During Treatment Gait belt    Activity Tolerance Patient tolerated treatment well;Patient limited by fatigue    Behavior During Therapy WFL for tasks assessed/performed               Past Medical History:  Diagnosis Date   Bladder retention    Complication of anesthesia    ? seizures after anesthesia    Depression    Diarrhea due to malabsorption    Dizziness    Headache    Migraines    Neurogenic bladder    Pyelonephritis    Renal disorder    Vision abnormalities    Weight loss    Past Surgical History:  Procedure Laterality Date   ANTERIOR CRUCIATE LIGAMENT REPAIR  1997   APPENDECTOMY     BLADDER REMOVAL     COLONOSCOPY WITH PROPOFOL  N/A 11/11/2018   Procedure: COLONOSCOPY WITH PROPOFOL ;  Surgeon: Deveron Fly, MD;  Location: [facility] ENDOSCOPY;  Service: Endoscopy;  Laterality: N/A;   CYSTOSCOPY WITH STENT PLACEMENT Right 04/17/2016   Procedure: CYSTOSCOPY WITH STENT PLACEMENT;  Surgeon: Marco Severs, MD;  Location: [facility] ORS;  Service: Urology;  Laterality: Right;   ESOPHAGOGASTRODUODENOSCOPY (EGD) WITH PROPOFOL  N/A 11/11/2018   Procedure: ESOPHAGOGASTRODUODENOSCOPY (EGD) WITH PROPOFOL ;  Surgeon: Deveron Fly, MD;  Location: [facility] ENDOSCOPY;  Service: Endoscopy;  Laterality: N/A;   EXPLORATORY LAPAROTOMY  1999   HIP SURGERY Right    KIDNEY STONE SURGERY  04/2016   REVISION UROSTOMY CUTANEOUS     REVISION UROSTOMY CUTANEOUS  01/10/2018   SUPRAPUBIC CATHETER PLACEMENT  08/2017   TONSILLECTOMY      Patient Active Problem List   Diagnosis Date Noted   Stroke-like symptoms 06/13/2023   Refractory nausea and vomiting 02/22/2023   Syncope and collapse 02/19/2023   Palliative care by specialist 02/19/2023   UTI (urinary tract infection) 02/13/2023   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   Neurogenic bladder 02/13/2023   Hepatic steatosis 02/13/2023   Wheelchair dependent 02/13/2023   Secondary adrenal insufficiency (HCC) 02/13/2023   Right flank pain 02/13/2023   Iron deficiency anemia 10/02/2022   Seizure (HCC) 11/11/2018   Major depressive disorder, recurrent episode, moderate (HCC) 02/07/2018   Nephrolithiasis 04/16/2016   Numbness 07/28/2015   Bladder retention 06/23/2015   Abdominal pain 06/04/2015   Dizziness 05/18/2015   Neck pain 05/18/2015   Complicated migraine 04/28/2015   Other fatigue 04/28/2015   Abnormal finding on MRI of brain 04/28/2015   Diarrhea 03/29/2015   H/O disease 03/29/2015   Abnormal weight loss 03/29/2015   Muscle weakness (generalized) 03/14/2015   Headache, migraine 03/10/2015    ONSET DATE: 06/13/23  REFERRING DIAG: CVA  THERAPY DIAG:  Muscle weakness (generalized)  Unsteadiness on feet  Difficulty in walking, not elsewhere classified  Other lack of coordination  Pain in left hip  Pain in right hip  Other muscle spasm  Rationale for Evaluation and Treatment: Rehabilitation  SUBJECTIVE:  SUBJECTIVE STATEMENT:  Patient presents to pelvic PT and provides this information:  Pt was sent to ED March 31 at [facility] for R abdominal pain below her stoma. Low blood flow to her ovary . Pt saw her gynecologist yesterday and they are going to try to a tap block to address the pain which she is caused by multifactorial issues.  Pt had  hysterectomy in 2024 and fallopian tubes and uterus , bladder and urethra were removed. Ovaries remained to prevent from early menopause.  Pt had kidney infections that led her be hospitalized for Jan 13- Feb 9  . During that time, pt learned she had endometrioma on the R ovary. Pt was placed on hormones for shrink the endometrioma. Pain has escalated with the hormone treatment and the endometrioma grew in size. Due to the   hormone Tx was not helping, they  will be replaced by Lupron once insurances approves. Pt is not wanting to take Lupron.              R abdominal pain below her stoma : Pain is intermittent. There is throbbing pain. The pain triggers nausea 4 x within the last 5 days.  Due to the nausea, pt has not had much of an appetite. Pt has daily BM but it is like IBS, sometimes loose and sometimes hard stools. Stool consistency 50% Type 6-7, 50%  Type 4-5    It has not escalated to the level that lead to the ED visit and the pain readiated to the pelvic area "like labor pains but no baby" .  Baseline 6-7/10 pain lately and is localized at the flank and groin not pelvic area. Near her stoma bag. When she removes her stoma bag, there is no pain. The pain was triggered with leaning back too far and lifting legs for an exercise. There is not rhyme or reason when the pain occurs. It also occurs when sleeping . It does not matter if she is on her back or side. 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  Pt has tried vaginal Valium  and it helped some but she found it has sedative effect and only uses when she is in desperate situation.  Pt feels the pain is less related to pelvic floor.    Pt have abdominal scars where 2 surgeries were performed.    Gynecologist referred her for Pelvic PT. Pt would like to achieve to 3/10 pain and be able to attend children's activities. Pt would like to have less pain when undergoing vaginal US  exam because they were very painful.     Decrease pain triggering nausea from 4 x within the last 5 days to < 2 x with one week.  Decrease scar adhesion.   Stool consistency 50% Type 6-7, 50%  Type 4-5    Pt was  accompanied by: significant other  ( not today)      PERTINENT HISTORY:  Patient is referred to PT s/p CVA.  Patient presents to PT s/p CVA on 06/13/23.  Pmh includes adrenal insufficiency, Hereditary spastic paraplegia (HSP), small fiber neuropathy c/b BLE paraplegia and neurogenic bladder s/p cystectomy w/ ileal condiuit (05/2022), chronic pain, migraine. Patient was recently hospitalized prior to this at [facility] from 04/30/23 to 05/27/23. Patient reports L sided weakness. Is having stabbing pains for a few minutes.   PAIN:  Are you having pain?  Has been having sharp stabbing pains , throbbing pain at the flank  and R groin area   PRECAUTIONS: Fall  RED FLAGS: None and hx of bladder removal      WEIGHT BEARING RESTRICTIONS: No  FALLS: Has patient fallen in last 6 months? Yes. Number of falls 3  LIVING ENVIRONMENT: Lives with: lives with their family and lives with their spouse Lives in: House/apartment Stairs:  ramp Has following equipment at home: Wheelchair (manual) and Ramped entry  PLOF: Independent with household mobility with device  PATIENT GOALS: to get stronger, do ADLs better.   OBJECTIVE:  Note: Objective measures were completed at Evaluation unless otherwise noted.  DIAGNOSTIC FINDINGS:  MRI results received from [facility] "1. 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 2. Adjacent small acute infarct within the posterior right frontal lobe white matter. 3. Background moderate multifocal T2 FLAIR hyperintense signal abnormality within the cerebral white matter (with a subcortical white matter predominance), greater than expected for age. These signal changes are nonspecific and differential  considerations include accelerated chronic small vessel ischemic disease, sequelae of chronic migraine headaches, sequelae of vasculitis/hypercoagulable state and sequelae of a prior infectious/inflammatory process, among others.   MRA head:   Significantly motion degraded examination. No proximal intracranial large vessel occlusion is identified. Motion artifact precludes accurate assessment for stenoses and for vessel irregularity.    COGNITION: Overall cognitive status: Within functional limits for tasks assessed   SENSATION: Light touch: Impaired ; below R knee has decreased sensation   COORDINATION: Heel shin: tonic shaking movements    MUSCLE TONE: LLE: Moderate RLE Moderate   POSTURE: forward head, increased thoracic kyphosis, posterior pelvic tilt, and flexed trunk     LOWER EXTREMITY MMT:    MMT Right Eval Left Eval  Hip flexion 6.2 5.6  Hip extension    Hip abduction 6.5 6.4  Hip adduction 3.4 3.4  Hip internal rotation    Hip external rotation    Knee flexion 5.1 1.0  Knee extension 7.1 3.2  Ankle dorsiflexion    Ankle plantarflexion    Ankle inversion    Ankle eversion    (Blank rows = not tested)  BED MOBILITY:  Will assess in future session.  TRANSFERS: Assistive device utilized: Environmental consultant - 4 wheeled  Sit to stand: CGA Stand to sit: CGA Chair to chair: Min A     GAIT: Gait pattern: step to pattern, knee flexed in stance- Right, knee flexed in stance- Left, ataxic, trunk flexed, poor foot clearance- Right, and poor foot clearance- Left Distance walked: 8 ft  Assistive device utilized: Walker - 4 wheeled Level of assistance: CGA and Min A Comments: Jerking tonic  gait pattern   FUNCTIONAL TESTS:  5 times sit to stand: 46.33 with hands  10 meter walk test: 1.04 for 8 ft; unable to finish  PATIENT SURVEYS:  Stroke Impact Scale give next session  TREATMENT DATE: 08/15/23    [facility] PT Assessment - 08/15/23 1713       Observation/Other Assessments   Observations upright posture      Palpation   Palpation comment deeply restricted ab scar restrictions below umbilicus             [facility] Adult PT Treatment/Exercise - 08/15/23 1712       Self-Care   Self-Care Other Self-Care Comments    Other Self-Care Comments  co-created goals for pain management, bowel function / GI function, strengthening of low ab to minimize hernias near stoma, decrease scar restrictions at low ab      Therapeutic Activites    Therapeutic Activities Other Therapeutic Activities      Neuro Re-ed    Neuro Re-ed Details  cued for gentle abdominal massage over scar restrictions               PATIENT EDUCATION: Education details: goals, POC  Person educated: Patient Education method: Explanation, Demonstration, Tactile cues, and Verbal cues Education comprehension: verbalized understanding, returned demonstration, verbal cues required, and tactile cues required  HOME EXERCISE PROGRAM: See pt instructions  GOALS: Goals reviewed with patient? Yes  SHORT TERM GOALS: Target date: 07/23/2023    Patient will be independent in home exercise program to improve strength/mobility for better functional independence with ADLs. Baseline: 3/10: give next session Goal status: Ongoing    LONG TERM GOALS: Target date: 10/24/2023    Patient (< 52 years old) will complete five times sit to stand test in < 10 seconds indicating an increased LE strength and improved balance. Baseline: 3/10: 46.33 Goal status: Ongoing  2.  Patient will perform 10 MWT under 2 minutes with no rest breaks indicating improved functional ambulation. Baseline: 3/10: 8 ft in 1 min 4 seconds Goal status: Ongoing  3.  Patient will tolerate standing 3 minutes for ADL performance Baseline: 0/34: knee buckling in standing Goal status: Ongoing  4.   Patient will increase BLE gross strength to 4+/5 and/or within 1lb of each limb  as to improve functional strength for independent gait, increased standing tolerance and increased ADL ability. Baseline: 3/10: see above  Goal status: Ongoing               5. Pt will demo increased ab scar mobility to achieve more relaxation of low abdomen and pelvic floor in order to experience <3/10 pain and be able to attend children's activities and  when undergoing vaginal US  exam because they were very painful.  Baseline:Gynecologist referred her for Pelvic PT. Pt would like to achieve to 3/10 pain and be able to attend children's activities. Pt would like to have less pain when undergoing vaginal US  exam because they were very painful.  Goal status: INITIAL             6.  Decrease pain triggering nausea from 4 x within the last 5 days to < 2 x with one week.                        Baseline:  Decrease pain triggering nausea from 4 x within the last 5 days to < 2 x with one week.     Goal status: INITIAL  7. Pt will demo improved stool consistency Type 4-5 to  >75 % in order to minimize ab pain, promote GI , bowel and pelvic floor functio           Baseline:  Stool  consistency 50% Type 6-7, 50%  Type 4-5            Goal status: INITIAL   ASSESSMENT:  CLINICAL IMPRESSION:  Pelvic PT is joining pt's PT team with one visit per week and neuro PT will continue to see pt once a week , totaling 2 PT visits per week.   Pt has been experiencing R abdominal pain below her stoma and working with gynecologist re: pain management Tx.  PT assessment showed severely restricted abdominal scar restrictions which will be MSK focus to improve pain , GI/ bowel, pelvic floor function.  Suspect scar adhesions are contributing to pain as pt had 2 surgeries over thee same scar. Education on body mechanics and gentle strengthening will be important to minimize hernia near stoma and to minimize prolapse 2/2 Hx of hysterectomy and  to promote motility for GI function and minimize nausea triggered by pain. New goals have been added to today's recert.   Provided education today on gentle fascial mobility self-massage. Pt performed correctly post Tx. Encouraged pt to maintain gentle trunk rotation and sideflexion which will continue to help with scar mobility. Plan to perform manual Tx to release abdominal scar restrictions at next session.   Patient will benefit from skilled physical therapy to increase functional mobility, strength, and independence with ADLs.     OBJECTIVE IMPAIRMENTS: Abnormal gait, decreased activity tolerance, decreased balance, decreased coordination, decreased endurance, decreased mobility, difficulty walking, decreased strength, dizziness, impaired perceived functional ability, increased muscle spasms, impaired flexibility, improper body mechanics, and postural dysfunction.   ACTIVITY LIMITATIONS: carrying, lifting, bending, sitting, standing, squatting, sleeping, stairs, transfers, bed mobility, bathing, toileting, dressing, reach over head, hygiene/grooming, locomotion level, and caring for others  PARTICIPATION LIMITATIONS: meal prep, cleaning, laundry, interpersonal relationship, driving, shopping, community activity, yard work, and church  PERSONAL FACTORS: Age, Past/current experiences, Time since onset of injury/illness/exacerbation, Transportation, and 3+ comorbidities: adrenal insufficiency, Hereditary spastic paraplegia (HSP), small fiber neuropathy c/b BLE paraplegia and neurogenic bladder s/p cystectomy w/ ileal condiuit (05/2022), chronic pain, migraine  are also affecting patient's functional outcome.   REHAB POTENTIAL: Good  CLINICAL DECISION MAKING: Evolving/moderate complexity  EVALUATION COMPLEXITY: Moderate  PLAN:  PT FREQUENCY: 2x/week  PT DURATION: 12 weeks  PLANNED INTERVENTIONS: 97164- PT Re-evaluation, 97110-Therapeutic exercises, 97530- Therapeutic activity, [id number]-  Neuromuscular re-education, 97535- Self Care, 60454- Manual therapy, [id number]- Gait training, [phone]- Orthotic Fit/training, [phone]- Canalith repositioning, [id number]- Splinting, [id number]- Electrical stimulation (unattended), [phone]- Electrical stimulation (manual), [id number]- Vasopneumatic device, [id number]- Ultrasound, [id number]- Traction (mechanical), [id number]- Ionotophoresis 4mg /ml Dexamethasone , Patient/Family education, Balance training, Stair training, Taping, Dry Needling, Joint mobilization, Joint manipulation, Spinal manipulation, Spinal mobilization, Scar mobilization, Compression bandaging, Vestibular training, Visual/preceptual remediation/compensation, DME instructions, Wheelchair mobility training, Cryotherapy, Moist heat, and Biofeedback  PLAN FOR NEXT SESSION:   [place] Andreas, PT 08/15/2023, 9:03 AM

## 2023-08-16 NOTE — Therapy (Incomplete)
 OUTPATIENT PHYSICAL THERAPY NEURO TREATMENT / RECERT    Patient Name: Christine Chavez MRN: [id number] DOB:[birth year], 43 y.o., female Today's Date: 08/16/2023   PCP: Madaline Scales REFERRING PROVIDER: Devora Folks  END OF SESSION:      Past Medical History:  Diagnosis Date   Bladder retention    Complication of anesthesia    ? seizures after anesthesia    Depression    Diarrhea due to malabsorption    Dizziness    Headache    Migraines    Neurogenic bladder    Pyelonephritis    Renal disorder    Vision abnormalities    Weight loss    Past Surgical History:  Procedure Laterality Date   ANTERIOR CRUCIATE LIGAMENT REPAIR  1997   APPENDECTOMY     BLADDER REMOVAL     COLONOSCOPY WITH PROPOFOL  N/A 11/11/2018   Procedure: COLONOSCOPY WITH PROPOFOL ;  Surgeon: Deveron Fly, MD;  Location: [facility] ENDOSCOPY;  Service: Endoscopy;  Laterality: N/A;   CYSTOSCOPY WITH STENT PLACEMENT Right 04/17/2016   Procedure: CYSTOSCOPY WITH STENT PLACEMENT;  Surgeon: Marco Severs, MD;  Location: [facility] ORS;  Service: Urology;  Laterality: Right;   ESOPHAGOGASTRODUODENOSCOPY (EGD) WITH PROPOFOL  N/A 11/11/2018   Procedure: ESOPHAGOGASTRODUODENOSCOPY (EGD) WITH PROPOFOL ;  Surgeon: Deveron Fly, MD;  Location: [facility] ENDOSCOPY;  Service: Endoscopy;  Laterality: N/A;   EXPLORATORY LAPAROTOMY  1999   HIP SURGERY Right    KIDNEY STONE SURGERY  04/2016   REVISION UROSTOMY CUTANEOUS     REVISION UROSTOMY CUTANEOUS  01/10/2018   SUPRAPUBIC CATHETER PLACEMENT  08/2017   TONSILLECTOMY     Patient Active Problem List   Diagnosis Date Noted   Stroke-like symptoms 06/13/2023   Refractory nausea and vomiting 02/22/2023   Syncope and collapse 02/19/2023   Palliative care by specialist 02/19/2023   UTI (urinary tract infection) 02/13/2023   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   Neurogenic bladder 02/13/2023   Hepatic steatosis 02/13/2023   Wheelchair dependent 02/13/2023    Secondary adrenal insufficiency (HCC) 02/13/2023   Right flank pain 02/13/2023   Iron deficiency anemia 10/02/2022   Seizure (HCC) 11/11/2018   Major depressive disorder, recurrent episode, moderate (HCC) 02/07/2018   Nephrolithiasis 04/16/2016   Numbness 07/28/2015   Bladder retention 06/23/2015   Abdominal pain 06/04/2015   Dizziness 05/18/2015   Neck pain 05/18/2015   Complicated migraine 04/28/2015   Other fatigue 04/28/2015   Abnormal finding on MRI of brain 04/28/2015   Diarrhea 03/29/2015   H/O disease 03/29/2015   Abnormal weight loss 03/29/2015   Muscle weakness (generalized) 03/14/2015   Headache, migraine 03/10/2015    ONSET DATE: 06/13/23  REFERRING DIAG: CVA  THERAPY DIAG:  No diagnosis found.  Rationale for Evaluation and Treatment: Rehabilitation  SUBJECTIVE:  SUBJECTIVE STATEMENT:  Patient presents to pelvic PT and provides this information:  Pt was sent to ED March 31 at [facility] for R abdominal pain below her stoma. Low blood flow to her ovary . Pt saw her gynecologist yesterday and they are going to try to a tap block to address the pain which she is caused by multifactorial issues.  Pt had hysterectomy in 2024 and fallopian tubes and uterus , bladder and urethra were removed. Ovaries remained to prevent from early menopause.  Pt had kidney infections that led her be hospitalized for Jan 13- Feb 9  . During that time, pt learned she had endometrioma on the R ovary. Pt was placed on hormones for shrink the endometrioma. Pain has escalated with the hormone treatment and the endometrioma grew in size. Due to the   hormone Tx was not helping, they  will be replaced by Lupron once insurances approves. Pt is not wanting to take Lupron.              R abdominal pain below her  stoma : Pain is intermittent. There is throbbing pain. The pain triggers nausea 4 x within the last 5 days.  Due to the nausea, pt has not had much of an appetite. Pt has daily BM but it is like IBS, sometimes loose and sometimes hard stools. Stool consistency 50% Type 6-7, 50%  Type 4-5    It has not escalated to the level that lead to the ED visit and the pain readiated to the pelvic area "like labor pains but no baby" .  Baseline 6-7/10 pain lately and is localized at the flank and groin not pelvic area. Near her stoma bag. When she removes her stoma bag, there is no pain. The pain was triggered with leaning back too far and lifting legs for an exercise. There is not rhyme or reason when the pain occurs. It also occurs when sleeping . It does not matter if she is on her back or side. 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  Pt has tried vaginal Valium  and it helped some but she found it has sedative effect and only uses when she is in desperate situation.  Pt feels the pain is less related to pelvic floor.    Pt have abdominal scars where 2 surgeries were performed.    Gynecologist referred her for Pelvic PT. Pt would like to achieve to 3/10 pain and be able to attend children's activities. Pt would like to have less pain when undergoing vaginal US  exam because they were very painful.    Decrease pain triggering nausea from 4 x within the last 5 days to < 2 x with one week.  Decrease scar adhesion.   Stool consistency 50% Type 6-7, 50%  Type 4-5    Pt was  accompanied by: significant other  ( not today)      PERTINENT HISTORY:  Patient is referred to PT s/p CVA.  Patient presents to PT s/p CVA on 06/13/23.  Pmh includes adrenal insufficiency, Hereditary spastic paraplegia (HSP), small fiber neuropathy c/b BLE paraplegia and neurogenic bladder s/p cystectomy w/ ileal condiuit (05/2022), chronic pain, migraine. Patient was recently hospitalized prior to this at  [facility] from 04/30/23 to 05/27/23. Patient reports L sided weakness. Is having stabbing pains for a few minutes.   PAIN:  Are you having pain?  Has been having sharp stabbing pains , throbbing pain at the flank  and R groin area   PRECAUTIONS: Fall  RED FLAGS: None and hx of bladder removal      WEIGHT BEARING RESTRICTIONS: No  FALLS: Has patient fallen in last 6 months? Yes. Number of falls 3  LIVING ENVIRONMENT: Lives with: lives with their family and lives with their spouse Lives in: House/apartment Stairs:  ramp Has following equipment at home: Wheelchair (manual) and Ramped entry  PLOF: Independent with household mobility with device  PATIENT GOALS: to get stronger, do ADLs better.   OBJECTIVE:  Note: Objective measures were completed at Evaluation unless otherwise noted.  DIAGNOSTIC FINDINGS:  MRI results received from [facility] "1. 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 2. Adjacent small acute infarct within the posterior right frontal lobe white matter. 3. Background moderate multifocal T2 FLAIR hyperintense signal abnormality within the cerebral white matter (with a subcortical white matter predominance), greater than expected for age. These signal changes are nonspecific and differential considerations include accelerated chronic small vessel ischemic disease, sequelae of chronic migraine headaches, sequelae of vasculitis/hypercoagulable state and sequelae of a prior infectious/inflammatory process, among others.   MRA head:   Significantly motion degraded examination. No proximal intracranial large vessel occlusion is identified. Motion artifact precludes accurate assessment for stenoses and for vessel irregularity.    COGNITION: Overall cognitive status: Within functional limits for tasks assessed   SENSATION: Light touch: Impaired ; below R knee has decreased  sensation   COORDINATION: Heel shin: tonic shaking movements    MUSCLE TONE: LLE: Moderate RLE Moderate   POSTURE: forward head, increased thoracic kyphosis, posterior pelvic tilt, and flexed trunk     LOWER EXTREMITY MMT:    MMT Right Eval Left Eval  Hip flexion 6.2 5.6  Hip extension    Hip abduction 6.5 6.4  Hip adduction 3.4 3.4  Hip internal rotation    Hip external rotation    Knee flexion 5.1 1.0  Knee extension 7.1 3.2  Ankle dorsiflexion    Ankle plantarflexion    Ankle inversion    Ankle eversion    (Blank rows = not tested)  BED MOBILITY:  Will assess in future session.  TRANSFERS: Assistive device utilized: Environmental consultant - 4 wheeled  Sit to stand: CGA Stand to sit: CGA Chair to chair: Min A     GAIT: Gait pattern: step to pattern, knee flexed in stance- Right, knee flexed in stance- Left, ataxic, trunk flexed, poor foot clearance- Right, and poor foot clearance- Left Distance walked: 8 ft  Assistive device utilized: Walker - 4 wheeled Level of assistance: CGA and Min A Comments: Jerking tonic  gait pattern   FUNCTIONAL TESTS:  5 times sit to stand: 46.33 with hands  10 meter walk test: 1.04 for 8 ft; unable to finish  PATIENT SURVEYS:  Stroke Impact Scale give next session  TREATMENT DATE: 08/16/23          PATIENT EDUCATION: Education details: goals, POC  Person educated: Patient Education method: Explanation, Demonstration, Tactile cues, and Verbal cues Education comprehension: verbalized understanding, returned demonstration, verbal cues required, and tactile cues required  HOME EXERCISE PROGRAM: See pt instructions  GOALS: Goals reviewed with patient? Yes  SHORT TERM GOALS: Target date: 07/23/2023    Patient will be independent in home exercise program to improve strength/mobility for better functional  independence with ADLs. Baseline: 3/10: give next session Goal status: Ongoing    LONG TERM GOALS: Target date: 10/24/2023    Patient (< 15 years old) will complete five times sit to stand test in < 10 seconds indicating an increased LE strength and improved balance. Baseline: 3/10: 46.33 Goal status: Ongoing  2.  Patient will perform 10 MWT under 2 minutes with no rest breaks indicating improved functional ambulation. Baseline: 3/10: 8 ft in 1 min 4 seconds Goal status: Ongoing  3.  Patient will tolerate standing 3 minutes for ADL performance Baseline: 4/09: knee buckling in standing Goal status: Ongoing  4.  Patient will increase BLE gross strength to 4+/5 and/or within 1lb of each limb  as to improve functional strength for independent gait, increased standing tolerance and increased ADL ability. Baseline: 3/10: see above  Goal status: Ongoing               5. Pt will demo increased ab scar mobility to achieve more relaxation of low abdomen and pelvic floor in order to experience <3/10 pain and be able to attend children's activities and  when undergoing vaginal US  exam because they were very painful.  Baseline:Gynecologist referred her for Pelvic PT. Pt would like to achieve to 3/10 pain and be able to attend children's activities. Pt would like to have less pain when undergoing vaginal US  exam because they were very painful.  Goal status: INITIAL             6.  Decrease pain triggering nausea from 4 x within the last 5 days to < 2 x with one week.                        Baseline:  Decrease pain triggering nausea from 4 x within the last 5 days to < 2 x with one week.     Goal status: INITIAL  7. Pt will demo improved stool consistency Type 4-5 to  >75 % in order to minimize ab pain, promote GI , bowel and pelvic floor functio           Baseline:  Stool consistency 50% Type 6-7, 50%  Type 4-5            Goal status: INITIAL   ASSESSMENT:  CLINICAL  IMPRESSION:  ***  Patient will benefit from skilled physical therapy to increase functional mobility, strength, and independence with ADLs.     OBJECTIVE IMPAIRMENTS: Abnormal gait, decreased activity tolerance, decreased balance, decreased coordination, decreased endurance, decreased mobility, difficulty walking, decreased strength, dizziness, impaired perceived functional ability, increased muscle spasms, impaired flexibility, improper body mechanics, and postural dysfunction.   ACTIVITY LIMITATIONS: carrying, lifting, bending, sitting, standing, squatting, sleeping, stairs, transfers, bed mobility, bathing, toileting, dressing, reach over head, hygiene/grooming, locomotion level, and caring for others  PARTICIPATION LIMITATIONS: meal prep, cleaning, laundry, interpersonal relationship, driving, shopping, community activity, yard work, and church  PERSONAL FACTORS: Age, Past/current experiences, Time since onset of injury/illness/exacerbation, Transportation, and  3+ comorbidities: adrenal insufficiency, Hereditary spastic paraplegia (HSP), small fiber neuropathy c/b BLE paraplegia and neurogenic bladder s/p cystectomy w/ ileal condiuit (05/2022), chronic pain, migraine  are also affecting patient's functional outcome.   REHAB POTENTIAL: Good  CLINICAL DECISION MAKING: Evolving/moderate complexity  EVALUATION COMPLEXITY: Moderate  PLAN:  PT FREQUENCY: 2x/week  PT DURATION: 12 weeks  PLANNED INTERVENTIONS: 97164- PT Re-evaluation, 97110-Therapeutic exercises, 97530- Therapeutic activity, 97112- Neuromuscular re-education, 97535- Self Care, 82956- Manual therapy, [id number]- Gait training, [phone]- Orthotic Fit/training, [phone]- Canalith repositioning, [id number]- Splinting, [id number]- Electrical stimulation (unattended), [phone]- Electrical stimulation (manual), [id number]- Vasopneumatic device, [id number]- Ultrasound, [id number]- Traction (mechanical), [id number]- Ionotophoresis 4mg /ml Dexamethasone , Patient/Family education,  Balance training, Stair training, Taping, Dry Needling, Joint mobilization, Joint manipulation, Spinal manipulation, Spinal mobilization, Scar mobilization, Compression bandaging, Vestibular training, Visual/preceptual remediation/compensation, DME instructions, Wheelchair mobility training, Cryotherapy, Moist heat, and Biofeedback  PLAN FOR NEXT SESSION:   Karen Kinnard, PT 08/16/2023, 12:56 PM

## 2023-08-16 NOTE — Therapy (Signed)
 OUTPATIENT PHYSICAL THERAPY NEURO TREATMENT / RECERT    Patient Name: Christine Chavez MRN: [id number] DOB:Jun 28, 1980, 43 y.o., female Today's Date: 08/16/2023   PCP: Madaline Scales REFERRING PROVIDER: Devora Folks  END OF SESSION:      Past Medical History:  Diagnosis Date   Bladder retention    Complication of anesthesia    ? seizures after anesthesia    Depression    Diarrhea due to malabsorption    Dizziness    Headache    Migraines    Neurogenic bladder    Pyelonephritis    Renal disorder    Vision abnormalities    Weight loss    Past Surgical History:  Procedure Laterality Date   ANTERIOR CRUCIATE LIGAMENT REPAIR  1997   APPENDECTOMY     BLADDER REMOVAL     COLONOSCOPY WITH PROPOFOL  N/A 11/11/2018   Procedure: COLONOSCOPY WITH PROPOFOL ;  Surgeon: Deveron Fly, MD;  Location: [facility] ENDOSCOPY;  Service: Endoscopy;  Laterality: N/A;   CYSTOSCOPY WITH STENT PLACEMENT Right 04/17/2016   Procedure: CYSTOSCOPY WITH STENT PLACEMENT;  Surgeon: Marco Severs, MD;  Location: [facility] ORS;  Service: Urology;  Laterality: Right;   ESOPHAGOGASTRODUODENOSCOPY (EGD) WITH PROPOFOL  N/A 11/11/2018   Procedure: ESOPHAGOGASTRODUODENOSCOPY (EGD) WITH PROPOFOL ;  Surgeon: Deveron Fly, MD;  Location: [facility] ENDOSCOPY;  Service: Endoscopy;  Laterality: N/A;   EXPLORATORY LAPAROTOMY  1999   HIP SURGERY Right    KIDNEY STONE SURGERY  04/2016   REVISION UROSTOMY CUTANEOUS     REVISION UROSTOMY CUTANEOUS  01/10/2018   SUPRAPUBIC CATHETER PLACEMENT  08/2017   TONSILLECTOMY     Patient Active Problem List   Diagnosis Date Noted   Stroke-like symptoms 06/13/2023   Refractory nausea and vomiting 02/22/2023   Syncope and collapse 02/19/2023   Palliative care by specialist 02/19/2023   UTI (urinary tract infection) 02/13/2023   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   Neurogenic bladder 02/13/2023   Hepatic steatosis 02/13/2023   Wheelchair dependent 02/13/2023    Secondary adrenal insufficiency (HCC) 02/13/2023   Right flank pain 02/13/2023   Iron deficiency anemia 10/02/2022   Seizure (HCC) 11/11/2018   Major depressive disorder, recurrent episode, moderate (HCC) 02/07/2018   Nephrolithiasis 04/16/2016   Numbness 07/28/2015   Bladder retention 06/23/2015   Abdominal pain 06/04/2015   Dizziness 05/18/2015   Neck pain 05/18/2015   Complicated migraine 04/28/2015   Other fatigue 04/28/2015   Abnormal finding on MRI of brain 04/28/2015   Diarrhea 03/29/2015   H/O disease 03/29/2015   Abnormal weight loss 03/29/2015   Muscle weakness (generalized) 03/14/2015   Headache, migraine 03/10/2015    ONSET DATE: 06/13/23  REFERRING DIAG: CVA  THERAPY DIAG:  No diagnosis found.  Rationale for Evaluation and Treatment: Rehabilitation  SUBJECTIVE:  SUBJECTIVE STATEMENT:  ***  Pt was  accompanied by: significant other  ( not today)      PERTINENT HISTORY:  Patient is referred to PT s/p CVA.  Patient presents to PT s/p CVA on 06/13/23.  Pmh includes adrenal insufficiency, Hereditary spastic paraplegia (HSP), small fiber neuropathy c/b BLE paraplegia and neurogenic bladder s/p cystectomy w/ ileal condiuit (05/2022), chronic pain, migraine. Patient was recently hospitalized prior to this at [facility] from 04/30/23 to 05/27/23. Patient reports L sided weakness. Is having stabbing pains for a few minutes.   Patient presents to pelvic PT and provides this information:  Pt was sent to ED March 31 at [facility] for R abdominal pain below her stoma. Low blood flow to her ovary . Pt saw her gynecologist yesterday and they are going to try to a tap block to address the pain which she is caused by multifactorial issues.  Pt had hysterectomy in 2024 and fallopian tubes and  uterus , bladder and urethra were removed. Ovaries remained to prevent from early menopause.  Pt had kidney infections that led her be hospitalized for Jan 13- Feb 9  . During that time, pt learned she had endometrioma on the R ovary. Pt was placed on hormones for shrink the endometrioma. Pain has escalated with the hormone treatment and the endometrioma grew in size. Due to the   hormone Tx was not helping, they  will be replaced by Lupron once insurances approves. Pt is not wanting to take Lupron.              R abdominal pain below her stoma : Pain is intermittent. There is throbbing pain. The pain triggers nausea 4 x within the last 5 days.  Due to the nausea, pt has not had much of an appetite. Pt has daily BM but it is like IBS, sometimes loose and sometimes hard stools. Stool consistency 50% Type 6-7, 50%  Type 4-5    It has not escalated to the level that lead to the ED visit and the pain readiated to the pelvic area "like labor pains but no baby" .  Baseline 6-7/10 pain lately and is localized at the flank and groin not pelvic area. Near her stoma bag. When she removes her stoma bag, there is no pain. The pain was triggered with leaning back too far and lifting legs for an exercise. There is not rhyme or reason when the pain occurs. It also occurs when sleeping . It does not matter if she is on her back or side. 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  Pt has tried vaginal Valium  and it helped some but she found it has sedative effect and only uses when she is in desperate situation.  Pt feels the pain is less related to pelvic floor.    Pt have abdominal scars where 2 surgeries were performed.    Gynecologist referred her for Pelvic PT. Pt would like to achieve to 3/10 pain and be able to attend children's activities. Pt would like to have less pain when undergoing vaginal US  exam because they were very painful.    Decrease pain triggering nausea from 4 x  within the last 5 days to < 2 x with one week.  Decrease scar adhesion.   Stool consistency 50% Type 6-7, 50%  Type 4-5   PAIN:  Are you having pain?  Has been having sharp stabbing pains , throbbing pain at the  flank and R groin area   PRECAUTIONS: Fall  RED FLAGS: None and hx of bladder removal      WEIGHT BEARING RESTRICTIONS: No  FALLS: Has patient fallen in last 6 months? Yes. Number of falls 3  LIVING ENVIRONMENT: Lives with: lives with their family and lives with their spouse Lives in: House/apartment Stairs:  ramp Has following equipment at home: Wheelchair (manual) and Ramped entry  PLOF: Independent with household mobility with device  PATIENT GOALS: to get stronger, do ADLs better.   OBJECTIVE:  Note: Objective measures were completed at Evaluation unless otherwise noted.  DIAGNOSTIC FINDINGS:  MRI results received from [facility] "1. 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 2. Adjacent small acute infarct within the posterior right frontal lobe white matter. 3. Background moderate multifocal T2 FLAIR hyperintense signal abnormality within the cerebral white matter (with a subcortical white matter predominance), greater than expected for age. These signal changes are nonspecific and differential considerations include accelerated chronic small vessel ischemic disease, sequelae of chronic migraine headaches, sequelae of vasculitis/hypercoagulable state and sequelae of a prior infectious/inflammatory process, among others.   MRA head:   Significantly motion degraded examination. No proximal intracranial large vessel occlusion is identified. Motion artifact precludes accurate assessment for stenoses and for vessel irregularity.    COGNITION: Overall cognitive status: Within functional limits for tasks assessed   SENSATION: Light touch: Impaired ; below R knee has decreased  sensation   COORDINATION: Heel shin: tonic shaking movements    MUSCLE TONE: LLE: Moderate RLE Moderate   POSTURE: forward head, increased thoracic kyphosis, posterior pelvic tilt, and flexed trunk     LOWER EXTREMITY MMT:    MMT Right Eval Left Eval  Hip flexion 6.2 5.6  Hip extension    Hip abduction 6.5 6.4  Hip adduction 3.4 3.4  Hip internal rotation    Hip external rotation    Knee flexion 5.1 1.0  Knee extension 7.1 3.2  Ankle dorsiflexion    Ankle plantarflexion    Ankle inversion    Ankle eversion    (Blank rows = not tested)  BED MOBILITY:  Will assess in future session.  TRANSFERS: Assistive device utilized: Environmental consultant - 4 wheeled  Sit to stand: CGA Stand to sit: CGA Chair to chair: Min A     GAIT: Gait pattern: step to pattern, knee flexed in stance- Right, knee flexed in stance- Left, ataxic, trunk flexed, poor foot clearance- Right, and poor foot clearance- Left Distance walked: 8 ft  Assistive device utilized: Walker - 4 wheeled Level of assistance: CGA and Min A Comments: Jerking tonic  gait pattern   FUNCTIONAL TESTS:  5 times sit to stand: 46.33 with hands  10 meter walk test: 1.04 for 8 ft; unable to finish  PATIENT SURVEYS:  Stroke Impact Scale give next session  TREATMENT DATE: 08/16/23     ***     PATIENT EDUCATION: Education details: goals, POC  Person educated: Patient Education method: Explanation, Demonstration, Tactile cues, and Verbal cues Education comprehension: verbalized understanding, returned demonstration, verbal cues required, and tactile cues required  HOME EXERCISE PROGRAM: See pt instructions  GOALS: Goals reviewed with patient? Yes  SHORT TERM GOALS: Target date: 07/23/2023    Patient will be independent in home exercise program to improve strength/mobility for better functional  independence with ADLs. Baseline: 3/10: give next session Goal status: Ongoing    LONG TERM GOALS: Target date: 10/24/2023    Patient (< 36 years old) will complete five times sit to stand test in < 10 seconds indicating an increased LE strength and improved balance. Baseline: 3/10: 46.33 Goal status: Ongoing  2.  Patient will perform 10 MWT under 2 minutes with no rest breaks indicating improved functional ambulation. Baseline: 3/10: 8 ft in 1 min 4 seconds Goal status: Ongoing  3.  Patient will tolerate standing 3 minutes for ADL performance Baseline: 1/61: knee buckling in standing Goal status: Ongoing  4.  Patient will increase BLE gross strength to 4+/5 and/or within 1lb of each limb  as to improve functional strength for independent gait, increased standing tolerance and increased ADL ability. Baseline: 3/10: see above  Goal status: Ongoing               5. Pt will demo increased ab scar mobility to achieve more relaxation of low abdomen and pelvic floor in order to experience <3/10 pain and be able to attend children's activities and  when undergoing vaginal US  exam because they were very painful.  Baseline:Gynecologist referred her for Pelvic PT. Pt would like to achieve to 3/10 pain and be able to attend children's activities. Pt would like to have less pain when undergoing vaginal US  exam because they were very painful.  Goal status: INITIAL             6.  Decrease pain triggering nausea from 4 x within the last 5 days to < 2 x with one week.                        Baseline:  Decrease pain triggering nausea from 4 x within the last 5 days to < 2 x with one week.     Goal status: INITIAL  7. Pt will demo improved stool consistency Type 4-5 to  >75 % in order to minimize ab pain, promote GI , bowel and pelvic floor functio           Baseline:  Stool consistency 50% Type 6-7, 50%  Type 4-5            Goal status: INITIAL   ASSESSMENT:  CLINICAL  IMPRESSION: ***  Patient will benefit from skilled physical therapy to increase functional mobility, strength, and independence with ADLs.     OBJECTIVE IMPAIRMENTS: Abnormal gait, decreased activity tolerance, decreased balance, decreased coordination, decreased endurance, decreased mobility, difficulty walking, decreased strength, dizziness, impaired perceived functional ability, increased muscle spasms, impaired flexibility, improper body mechanics, and postural dysfunction.   ACTIVITY LIMITATIONS: carrying, lifting, bending, sitting, standing, squatting, sleeping, stairs, transfers, bed mobility, bathing, toileting, dressing, reach over head, hygiene/grooming, locomotion level, and caring for others  PARTICIPATION LIMITATIONS: meal prep, cleaning, laundry, interpersonal relationship, driving, shopping, community activity, yard work, and church  PERSONAL FACTORS: Age, Past/current experiences, Time since onset of injury/illness/exacerbation, Transportation, and 3+  comorbidities: adrenal insufficiency, Hereditary spastic paraplegia (HSP), small fiber neuropathy c/b BLE paraplegia and neurogenic bladder s/p cystectomy w/ ileal condiuit (05/2022), chronic pain, migraine  are also affecting patient's functional outcome.   REHAB POTENTIAL: Good  CLINICAL DECISION MAKING: Evolving/moderate complexity  EVALUATION COMPLEXITY: Moderate  PLAN:  PT FREQUENCY: 2x/week  PT DURATION: 12 weeks  PLANNED INTERVENTIONS: 97164- PT Re-evaluation, 97110-Therapeutic exercises, 97530- Therapeutic activity, 97112- Neuromuscular re-education, 97535- Self Care, 82956- Manual therapy, [id number]- Gait training, [phone]- Orthotic Fit/training, [phone]- Canalith repositioning, [id number]- Splinting, [id number]- Electrical stimulation (unattended), [phone]- Electrical stimulation (manual), [id number]- Vasopneumatic device, [id number]- Ultrasound, [id number]- Traction (mechanical), [id number]- Ionotophoresis 4mg /ml Dexamethasone , Patient/Family education,  Balance training, Stair training, Taping, Dry Needling, Joint mobilization, Joint manipulation, Spinal manipulation, Spinal mobilization, Scar mobilization, Compression bandaging, Vestibular training, Visual/preceptual remediation/compensation, DME instructions, Wheelchair mobility training, Cryotherapy, Moist heat, and Biofeedback  PLAN FOR NEXT SESSION:   Dhamar Gregory, PT 08/16/2023, 1:13 PM

## 2023-08-20 ENCOUNTER — Ambulatory Visit: Admitting: Occupational Therapy

## 2023-08-20 ENCOUNTER — Ambulatory Visit: Payer: BC Managed Care – PPO | Attending: Neurology

## 2023-08-20 DIAGNOSIS — M6281 Muscle weakness (generalized): Secondary | ICD-10-CM | POA: Insufficient documentation

## 2023-08-20 DIAGNOSIS — R262 Difficulty in walking, not elsewhere classified: Secondary | ICD-10-CM | POA: Diagnosis present

## 2023-08-20 DIAGNOSIS — R2681 Unsteadiness on feet: Secondary | ICD-10-CM | POA: Diagnosis present

## 2023-08-20 NOTE — Therapy (Signed)
 OUTPATIENT OCCUPATIONAL THERAPY NEURO TREATMENT NOTE  Patient Name: Christine Chavez MRN: [id number] DOB:27-May-1980, 43 y.o., female Today's Date: 08/20/2023  PCP: Madaline Scales, PA-C REFERRING PROVIDER: Rosan Comfort, MD  END OF SESSION:  OT End of Session - 08/20/23 1627     Visit Number 8    Number of Visits 24    Date for OT Re-Evaluation 09/19/23    OT Start Time 0937    OT Stop Time 1015    OT Time Calculation (min) 38 min    Activity Tolerance Patient tolerated treatment well    Behavior During Therapy WFL for tasks assessed/performed             Past Medical History:  Diagnosis Date   Bladder retention    Complication of anesthesia    ? seizures after anesthesia    Depression    Diarrhea due to malabsorption    Dizziness    Headache    Migraines    Neurogenic bladder    Pyelonephritis    Renal disorder    Vision abnormalities    Weight loss    Past Surgical History:  Procedure Laterality Date   ANTERIOR CRUCIATE LIGAMENT REPAIR  1997   APPENDECTOMY     BLADDER REMOVAL     COLONOSCOPY WITH PROPOFOL  N/A 11/11/2018   Procedure: COLONOSCOPY WITH PROPOFOL ;  Surgeon: Deveron Fly, MD;  Location: [facility] ENDOSCOPY;  Service: Endoscopy;  Laterality: N/A;   CYSTOSCOPY WITH STENT PLACEMENT Right 04/17/2016   Procedure: CYSTOSCOPY WITH STENT PLACEMENT;  Surgeon: Marco Severs, MD;  Location: [facility] ORS;  Service: Urology;  Laterality: Right;   ESOPHAGOGASTRODUODENOSCOPY (EGD) WITH PROPOFOL  N/A 11/11/2018   Procedure: ESOPHAGOGASTRODUODENOSCOPY (EGD) WITH PROPOFOL ;  Surgeon: Deveron Fly, MD;  Location: [facility] ENDOSCOPY;  Service: Endoscopy;  Laterality: N/A;   EXPLORATORY LAPAROTOMY  1999   HIP SURGERY Right    KIDNEY STONE SURGERY  04/2016   REVISION UROSTOMY CUTANEOUS     REVISION UROSTOMY CUTANEOUS  01/10/2018   SUPRAPUBIC CATHETER PLACEMENT  08/2017   TONSILLECTOMY     Patient Active Problem List   Diagnosis Date Noted   Stroke-like  symptoms 06/13/2023   Refractory nausea and vomiting 02/22/2023   Syncope and collapse 02/19/2023   Palliative care by specialist 02/19/2023   UTI (urinary tract infection) 02/13/2023   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   Neurogenic bladder 02/13/2023   Hepatic steatosis 02/13/2023   Wheelchair dependent 02/13/2023   Secondary adrenal insufficiency (HCC) 02/13/2023   Right flank pain 02/13/2023   Iron deficiency anemia 10/02/2022   Seizure (HCC) 11/11/2018   Major depressive disorder, recurrent episode, moderate (HCC) 02/07/2018   Nephrolithiasis 04/16/2016   Numbness 07/28/2015   Bladder retention 06/23/2015   Abdominal pain 06/04/2015   Dizziness 05/18/2015   Neck pain 05/18/2015   Complicated migraine 04/28/2015   Other fatigue 04/28/2015   Abnormal finding on MRI of brain 04/28/2015   Diarrhea 03/29/2015   H/O disease 03/29/2015   Abnormal weight loss 03/29/2015   Muscle weakness (generalized) 03/14/2015   Headache, migraine 03/10/2015    ONSET DATE: 06/13/23  REFERRING DIAG: CVA, HSP  THERAPY DIAG:  Muscle weakness (generalized)  Rationale for Evaluation and Treatment: Rehabilitation  SUBJECTIVE:   SUBJECTIVE STATEMENT:  Pt. reports she is planning to go to the beach for her anniversary next week.   Pt accompanied by: significant other Husband: Paul  PERTINENT HISTORY:   Pt. is a 43 y.o. female who was hospitalized with a CVA  from 06/13/23-06/14/23.  Imaging revealed two small acute cortical infarcts within the posterior Right frontal lobe (with involvement in the motor strip). PMHx includes: HSP With a  recent hospitalization from January 13-February 19th at [facility] 2/2 a Neuro Flare from the HSP. Hx of small fiber neuropathy d/t BLE paraplegia, Neurogenic bladder s/p cystectomy with ideal conduit (2/24) ,chronic pain, migraine. Pt. Reports multiple hospitalizations of the past year.  PRECAUTIONS: None  WEIGHT BEARING RESTRICTIONS:  No  PAIN:  Are you having pain? No reports of Pain, however reports not feeling well today -is planning to sees her primary care physician today.  FALLS: Has patient fallen in last 6 months? Yes. Number of falls 3  LIVING ENVIRONMENT: Lives with: lives with their family Lives in: House/apartment, One level Stairs:  ramp. Has following equipment at home: Otho Blitz - 4 wheeled, Wheelchair (power), Wheelchair (manual), Shower bench, and bed side commode, bed rail  PLOF: Independent  PATIENT GOALS:  Regain strength, and hand coordination  OBJECTIVE:  Note: Objective measures were completed at Evaluation unless otherwise noted.  HAND DOMINANCE: Right  ADLs: Overall ADLs:  Transfers/ambulation related to ADLs: Eating:  Independent, Cutting. Difficulty opening  Grooming: Independent UB Dressing: Independent LB Dressing: Independent, set-up Toileting:  Independent, Ostomy Bag Bathing: Independent once in the shower            Shower transfers: Husband assists with shower transfers.   IADLs: Shopping: Orders online. Husband picks them up Light housekeeping: Family assists Meal Prep: Engineer, structural, Assist with meals, cutting Community mobility: Relies on family, and friends Medication management: Independent Financial management: No change Handwriting: 100% legible, Pain/fatigues with writing note cards. Hobbies: Making gifts baskets; shopping ([employer] is a favorite)  MOBILITY STATUS: Uses a w/c  POSTURE COMMENTS:   Sitting balance:  WFL  ACTIVITY TOLERANCE: Activity tolerance: limited  FUNCTIONAL OUTCOME MEASURES: MAM-20: Sum score: 73/80; MAM Measure score: 71.8  UPPER EXTREMITY ROM:    Active ROM Right Eval WFL Left Eval [facility]  Shoulder flexion    Shoulder abduction    Shoulder adduction    Shoulder extension    Shoulder internal rotation    Shoulder external rotation    Elbow flexion    Elbow extension    Wrist flexion    Wrist extension    Wrist ulnar  deviation    Wrist radial deviation    Wrist pronation    Wrist supination    (Blank rows = not tested)  UPPER EXTREMITY MMT:     MMT Right eval Left eval  Shoulder flexion 3+/5 3+/5  Shoulder abduction 3+/5 3+/5  Shoulder adduction    Shoulder extension    Shoulder internal rotation    Shoulder external rotation    Middle trapezius    Lower trapezius    Elbow flexion 3+/5 3+/5  Elbow extension 3+/5 3+/5  Wrist flexion    Wrist extension 3+/5 3+/5  Wrist ulnar deviation    Wrist radial deviation    Wrist pronation 3+/5 3+/5  Wrist supination 3+/5 3+/5  (Blank rows = not tested)  HAND FUNCTION: Grip strength: Right: 5 lbs; Left: 2 lbs and Lateral pinch: Right: 5 lbs, Left: 2 lbs, 3pt. Pinch: R: 5 lbs, L: 2 lbs; 2pt. Pinch: R: 1 lbs, L: 1lb  COORDINATION: 9 Hole Peg test: Right: 25 sec; Left: 31 sec  SENSATION: WFL light touch, and proprioceptive awareness  EDEMA: N/A   COGNITION: Overall cognitive status: Within functional limits for tasks assessed  VISION: Subjective report:  Reports blurriness  PERCEPTION: WFL  PRAXIS: WFL                                                                                                                           TREATMENT DATE: 08/20/2023   Neuromuscular re-education:   -facilitated [facility] skills grasping, flipping, turning, and stacking [place]  style discs 60 discs. -facilitated bilateral simultaneous eye hand coordination  Pt. required visual demonstration, and cues for movement patterns. A speed component was added to the to further challenge the task.  -facilitated [facility] skills flipping cards using  thumb on fingers, and fingers on thumb movement patterns. -Facilitatedl [facility] skills needed to grasp small resistive beads. Pt. worked on connecting the beads using a 3pt. pinch, and pt. Pinch grasp. Pt. worked on disconnecting the resistive beads using a lateral pinch grasp, and 3pt. pinch grasp.     PATIENT EDUCATION: Education  details: UE functioning, strengthening Person educated: Patient and Spouse Education method: Museum/gallery conservator cues,verbal cues Education comprehension: verbalized understanding and needs further education  HOME EXERCISE PROGRAM:  Continue ongoing assessment, and provide as indicated   GOALS: Goals reviewed with patient? Yes  SHORT TERM GOALS: Target date: 08/08/2023    Pt. Will be independent with HEPs for BUEs. Baseline:No current HEP Goal status: INITIAL   LONG TERM GOALS: Target date: 09/19/2023    Pt. Will be improve BUE strength by 2 mm grades to be able to independently reach for ADL/IADL items in multiple planes. Baseline: Eval: BUE strength: 3+/5 overall  Goal status: INITIAL  2.  Pt. will improve bilateral grip strength to be able to securely hold items. Baseline: Eval: R: 5#, L: 2# Goal status: INITIAL  3.  Pt. Will improve bilateral lateral pinch strength by 3#  to be able to open medication bottles Baseline: Eval: R: 5#, L: 2# Goal status: INITIAL  4.  Pt. Will improve bilateral 3pt. pinch strength to be able to independently use a nail clipper Baseline: Eval: R: 3#, L: 2# Goal status: INITIAL  5.  Pt. Will will improve lef [facility] skills to be able to manipulate small objects. Baseline: Eval: R: 25 sec., L: 31 sec. Goal status: INITIAL  ASSESSMENT:  CLINICAL IMPRESSION:  Pt. was able to manipulate, and flip the 60 discs with the left hand. Pt. presented with slower movements with the left hand when performing bilateral hand movements, and presented with more difficulty when attempting to disconnect the resistive beads. Pt. required cues, assist, and visual demonstration for movement patterns. Pt. continues to present with limited [facility] movements in the left hand. Pt. continues to benefit from OT services to work on improving BUE functioning in order to improve engagement in, and participation in ADL, and IADL functioning.  PERFORMANCE DEFICITS: in  functional skills including ADLs, IADLs, coordination, dexterity, proprioception, ROM, strength, Fine motor control, Gross motor control, and UE functional use, cognitive skills including , and psychosocial skills including environmental adaptation, routines, and behaviors.   IMPAIRMENTS:  are limiting patient from ADLs, IADLs, and leisure.   CO-MORBIDITIES: may have co-morbidities  that affects occupational performance. Patient will benefit from skilled OT to address above impairments and improve overall function.  MODIFICATION OR ASSISTANCE TO COMPLETE EVALUATION: Min-Moderate modification of tasks or assist with assess necessary to complete an evaluation.  OT OCCUPATIONAL PROFILE AND HISTORY: Detailed assessment: Review of records and additional review of physical, cognitive, psychosocial history related to current functional performance.  CLINICAL DECISION MAKING: Moderate - several treatment options, min-mod task modification necessary  REHAB POTENTIAL: Good  EVALUATION COMPLEXITY: Moderate    PLAN:  OT FREQUENCY: 2x/week  OT DURATION: 12 weeks  PLANNED INTERVENTIONS: 97168 OT Re-evaluation, 97535 self care/ADL training, 16109 therapeutic exercise, 97530 therapeutic activity, 97112 neuromuscular re-education, 97018 paraffin, 60454 contrast bath, 97760 Orthotics management and training, 09811 Splinting (initial encounter), functional mobility training, patient/family education, and DME and/or AE instructions  RECOMMENDED OTHER SERVICES: PT  CONSULTED AND AGREED WITH PLAN OF CARE: Patient and family member/caregiver  PLAN FOR NEXT SESSION: Treatment  Duey Ghent, MS, OTR/L  08/20/2023, 4:29 PM

## 2023-08-22 ENCOUNTER — Encounter: Admitting: Occupational Therapy

## 2023-08-22 ENCOUNTER — Ambulatory Visit: Payer: BC Managed Care – PPO

## 2023-08-23 ENCOUNTER — Ambulatory Visit: Admitting: Physical Therapy

## 2023-08-23 ENCOUNTER — Ambulatory Visit: Admitting: Occupational Therapy

## 2023-08-23 DIAGNOSIS — R262 Difficulty in walking, not elsewhere classified: Secondary | ICD-10-CM

## 2023-08-23 DIAGNOSIS — M6281 Muscle weakness (generalized): Secondary | ICD-10-CM

## 2023-08-23 DIAGNOSIS — R2681 Unsteadiness on feet: Secondary | ICD-10-CM

## 2023-08-23 NOTE — Therapy (Signed)
 OUTPATIENT PHYSICAL THERAPY NEURO TREATMENT / Progress Note across 10 visits from 06/25/23  to 5/ 8/25    Patient Name: Christine Chavez MRN: [id number] DOB:May 29, 1980, 43 y.o., female Today's Date: 08/23/2023   PCP: Madaline Scales REFERRING PROVIDER: Devora Folks  END OF SESSION:  PT End of Session - 08/23/23 1148     Visit Number 10    Number of Visits 36    Date for PT Re-Evaluation 11/07/23    PT Start Time 1102    PT Stop Time 1147    PT Time Calculation (min) 45 min    Equipment Utilized During Treatment Gait belt    Activity Tolerance Patient tolerated treatment well;Patient limited by fatigue    Behavior During Therapy WFL for tasks assessed/performed                Past Medical History:  Diagnosis Date   Bladder retention    Complication of anesthesia    ? seizures after anesthesia    Depression    Diarrhea due to malabsorption    Dizziness    Headache    Migraines    Neurogenic bladder    Pyelonephritis    Renal disorder    Vision abnormalities    Weight loss    Past Surgical History:  Procedure Laterality Date   ANTERIOR CRUCIATE LIGAMENT REPAIR  1997   APPENDECTOMY     BLADDER REMOVAL     COLONOSCOPY WITH PROPOFOL  N/A 11/11/2018   Procedure: COLONOSCOPY WITH PROPOFOL ;  Surgeon: Deveron Fly, MD;  Location: [facility] ENDOSCOPY;  Service: Endoscopy;  Laterality: N/A;   CYSTOSCOPY WITH STENT PLACEMENT Right 04/17/2016   Procedure: CYSTOSCOPY WITH STENT PLACEMENT;  Surgeon: Marco Severs, MD;  Location: [facility] ORS;  Service: Urology;  Laterality: Right;   ESOPHAGOGASTRODUODENOSCOPY (EGD) WITH PROPOFOL  N/A 11/11/2018   Procedure: ESOPHAGOGASTRODUODENOSCOPY (EGD) WITH PROPOFOL ;  Surgeon: Deveron Fly, MD;  Location: [facility] ENDOSCOPY;  Service: Endoscopy;  Laterality: N/A;   EXPLORATORY LAPAROTOMY  1999   HIP SURGERY Right    KIDNEY STONE SURGERY  04/2016   REVISION UROSTOMY CUTANEOUS     REVISION UROSTOMY CUTANEOUS  01/10/2018    SUPRAPUBIC CATHETER PLACEMENT  08/2017   TONSILLECTOMY     Patient Active Problem List   Diagnosis Date Noted   Stroke-like symptoms 06/13/2023   Refractory nausea and vomiting 02/22/2023   Syncope and collapse 02/19/2023   Palliative care by specialist 02/19/2023   UTI (urinary tract infection) 02/13/2023   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   Neurogenic bladder 02/13/2023   Hepatic steatosis 02/13/2023   Wheelchair dependent 02/13/2023   Secondary adrenal insufficiency (HCC) 02/13/2023   Right flank pain 02/13/2023   Iron deficiency anemia 10/02/2022   Seizure (HCC) 11/11/2018   Major depressive disorder, recurrent episode, moderate (HCC) 02/07/2018   Nephrolithiasis 04/16/2016   Numbness 07/28/2015   Bladder retention 06/23/2015   Abdominal pain 06/04/2015   Dizziness 05/18/2015   Neck pain 05/18/2015   Complicated migraine 04/28/2015   Other fatigue 04/28/2015   Abnormal finding on MRI of brain 04/28/2015   Diarrhea 03/29/2015   H/O disease 03/29/2015   Abnormal weight loss 03/29/2015   Muscle weakness (generalized) 03/14/2015   Headache, migraine 03/10/2015    ONSET DATE: 06/13/23  REFERRING DIAG: CVA  THERAPY DIAG:  Muscle weakness (generalized)  Unsteadiness on feet  Difficulty in walking, not elsewhere classified  Rationale for Evaluation and Treatment: Rehabilitation  SUBJECTIVE:  SUBJECTIVE STATEMENT:  Patient reports she has been massaging her abdominal scar.   Pt was  accompanied by: significant other  ( not today)      PERTINENT HISTORY:  Patient is referred to PT s/p CVA.  Patient presents to PT s/p CVA on 06/13/23.  Pmh includes adrenal insufficiency, Hereditary spastic paraplegia (HSP), small fiber neuropathy c/b BLE paraplegia and neurogenic  bladder s/p cystectomy w/ ileal condiuit (05/2022), chronic pain, migraine. Patient was recently hospitalized prior to this at [facility] from 04/30/23 to 05/27/23. Patient reports L sided weakness. Is having stabbing pains for a few minutes.   Patient presents to pelvic PT and provides this information:  Pt was sent to ED March 31 at [facility] for R abdominal pain below her stoma. Low blood flow to her ovary . Pt saw her gynecologist yesterday and they are going to try to a tap block to address the pain which she is caused by multifactorial issues.  Pt had hysterectomy in 2024 and fallopian tubes and uterus , bladder and urethra were removed. Ovaries remained to prevent from early menopause.  Pt had kidney infections that led her be hospitalized for Jan 13- Feb 9  . During that time, pt learned she had endometrioma on the R ovary. Pt was placed on hormones for shrink the endometrioma. Pain has escalated with the hormone treatment and the endometrioma grew in size. Due to the   hormone Tx was not helping, they  will be replaced by Lupron once insurances approves. Pt is not wanting to take Lupron.              R abdominal pain below her stoma : Pain is intermittent. There is throbbing pain. The pain triggers nausea 4 x within the last 5 days.  Due to the nausea, pt has not had much of an appetite. Pt has daily BM but it is like IBS, sometimes loose and sometimes hard stools. Stool consistency 50% Type 6-7, 50%  Type 4-5    It has not escalated to the level that lead to the ED visit and the pain readiated to the pelvic area "like labor pains but no baby" .  Baseline 6-7/10 pain lately and is localized at the flank and groin not pelvic area. Near her stoma bag. When she removes her stoma bag, there is no pain. The pain was triggered with leaning back too far and lifting legs for an exercise. There is not rhyme or reason when the pain occurs. It also occurs when sleeping . It does not matter if she is on her  back or side. 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  Pt has tried vaginal Valium  and it helped some but she found it has sedative effect and only uses when she is in desperate situation.  Pt feels the pain is less related to pelvic floor.    Pt have abdominal scars where 2 surgeries were performed.    Gynecologist referred her for Pelvic PT. Pt would like to achieve to 3/10 pain and be able to attend children's activities. Pt would like to have less pain when undergoing vaginal US  exam because they were very painful.    Decrease pain triggering nausea from 4 x within the last 5 days to < 2 x with one week.  Decrease scar adhesion.   Stool consistency 50% Type 6-7, 50%  Type 4-5   PAIN:  Are you having pain? Has been having  sharp stabbing pains, throbbing pain at the flank and R groin area   PRECAUTIONS: Fall  RED FLAGS: None and hx of bladder removal     WEIGHT BEARING RESTRICTIONS: No  FALLS: Has patient fallen in last 6 months? Yes. Number of falls 3  LIVING ENVIRONMENT: Lives with: lives with their family and lives with their spouse Lives in: House/apartment Stairs: ramp Has following equipment at home: Wheelchair (manual) and Ramped entry  PLOF: Independent with household mobility with device  PATIENT GOALS: to get stronger, do ADLs better.   OBJECTIVE:  Note: Objective measures were completed at Evaluation unless otherwise noted.  DIAGNOSTIC FINDINGS:  MRI results received from [facility] "1. 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 2. Adjacent small acute infarct within the posterior right frontal lobe white matter. 3. Background moderate multifocal T2 FLAIR hyperintense signal abnormality within the cerebral white matter (with a subcortical white matter predominance), greater than expected for age. These signal changes are nonspecific and  differential considerations include accelerated chronic small vessel ischemic disease, sequelae of chronic migraine headaches, sequelae of vasculitis/hypercoagulable state and sequelae of a prior infectious/inflammatory process, among others.   MRA head:   Significantly motion degraded examination. No proximal intracranial large vessel occlusion is identified. Motion artifact precludes accurate assessment for stenoses and for vessel irregularity.    COGNITION: Overall cognitive status: Within functional limits for tasks assessed   SENSATION: Light touch: Impaired ; below R knee has decreased sensation   COORDINATION: Heel shin: tonic shaking movements    MUSCLE TONE: LLE: Moderate RLE Moderate   POSTURE: forward head, increased thoracic kyphosis, posterior pelvic tilt, and flexed trunk     LOWER EXTREMITY MMT:    MMT Right Eval Left Eval  Hip flexion 6.2 5.6  Hip extension    Hip abduction 6.5 6.4  Hip adduction 3.4 3.4  Hip internal rotation    Hip external rotation    Knee flexion 5.1 1.0  Knee extension 7.1 3.2  Ankle dorsiflexion    Ankle plantarflexion    Ankle inversion    Ankle eversion    (Blank rows = not tested)  BED MOBILITY:  Will assess in future session.  TRANSFERS: Assistive device utilized: Environmental consultant - 4 wheeled  Sit to stand: CGA Stand to sit: CGA Chair to chair: Min A     GAIT: Gait pattern: step to pattern, knee flexed in stance- Right, knee flexed in stance- Left, ataxic, trunk flexed, poor foot clearance- Right, and poor foot clearance- Left Distance walked: 8 ft  Assistive device utilized: Walker - 4 wheeled Level of assistance: CGA and Min A Comments: Jerking tonic  gait pattern   FUNCTIONAL TESTS:  5 times sit to stand: 46.33 with hands  10 meter walk test: 1.04 for 8 ft; unable to finish  PATIENT SURVEYS:  Stroke Impact Scale give next session 3  TREATMENT DATE: 08/23/23    [facility] PT Assessment - 08/23/23 1158       Palpation   Palpation comment decreasing scar restrictions. Tightness of fascia along posterior back/ thorax, lateral to ASIS L, plan to address R next session    Transfers:   from WC/ plinth-IND. Sit to stand without difficulty.            [facility] Adult PT Treatment/Exercise - 08/23/23 1211       Therapeutic Activites    Therapeutic Activities Other Therapeutic Activities    Other Therapeutic Activities restorative yoga stretch,      Neuro Re-ed    Neuro Re-ed Details  cued for gentle abdominal massage over scar restrictions with lower trunk rotation, butterfly stretch for  pubic symphysis attachments/ inner thighs      Manual Therapy   Manual therapy comments fascial release with low ab scar , flank, posterior back mm   Performed in semi-reclined position             PATIENT EDUCATION: Education details: goals, POC  Person educated: Patient Education method: Explanation, Demonstration, Tactile cues, and Verbal cues Education comprehension: verbalized understanding, returned demonstration, verbal cues required, and tactile cues required  HOME EXERCISE PROGRAM: See pt instructions  GOALS: Goals reviewed with patient? Yes  SHORT TERM GOALS: Target date: 07/23/2023    Patient will be independent in home exercise program to improve strength/mobility for better functional independence with ADLs. Baseline: 3/10: give next session Goal status: Ongoing    LONG TERM GOALS: Target date: 10/24/2023    Patient (< 4 years old) will complete five times sit to stand test in < 10 seconds indicating an increased LE strength and improved balance. Baseline: 3/10: 46.33 Goal status: Ongoing  2.  Patient will perform 10 MWT under 2 minutes with no rest breaks indicating improved functional ambulation. Baseline: 3/10: 8 ft in 1 min 4 seconds Goal status:  Ongoing  3.  Patient will tolerate standing 3 minutes for ADL performance Baseline: 0/98: knee buckling in standing Goal status: Ongoing  4.  Patient will increase BLE gross strength to 4+/5 and/or within 1lb of each limb  as to improve functional strength for independent gait, increased standing tolerance and increased ADL ability. Baseline: 3/10: see above  Goal status: Ongoing               5. Pt will demo increased ab scar mobility to achieve more relaxation of low abdomen and pelvic floor in order to experience <3/10 pain and be able to attend children's activities and  when undergoing vaginal US  exam because they were very painful.  Baseline:Gynecologist referred her for Pelvic PT. Pt would like to achieve to 3/10 pain and be able to attend children's activities. Pt would like to have less pain when undergoing vaginal US  exam because they were very painful.  Goal status  ONgoing             6.  Decrease pain triggering nausea from 4 x within the last 5 days to < 2 x with one week.                        Baseline:  Decrease pain triggering nausea from 4 x within the last 5 days to < 2 x with one week.     Goal status: ongoing   7. Pt will demo improved stool consistency Type 4-5 to  >75 % in order to minimize ab pain,  promote GI , bowel and pelvic floor functio           Baseline:  Stool consistency 50% Type 6-7, 50%  Type 4-5            Goal status: Ongoing   ASSESSMENT:  CLINICAL IMPRESSION:   Pt is progressing well towards goals for mobility, motility, pain management.   Pelvic PT joined  pt's PT team with one visit per week and neuro PT will continue to see pt once a week , totaling 2 PT visits per week.    Pt has been experiencing R abdominal pain below her stoma and working with gynecologist re: pain management Tx.  PT assessment showed severely restricted abdominal scar restrictions which will be MSK focus to improve pain , GI/ bowel, pelvic floor function.  Suspect scar  adhesions are contributing to pain as pt had 2 surgeries over thee same scar. Education on body mechanics and gentle strengthening will be important to minimize hernia near stoma and to minimize prolapse 2/2 Hx of hysterectomy and to promote motility for GI function and minimize nausea triggered by pain. New goals have been added to today's recert.    Provided  gentle fascial mobilization along B flank/ posterior midback, and lateral to low ab scar.  Pt demo'd improved scar mobility post Tx, Noted borborygmus post Tx , signifying improved mobility and anticipate enhanced GI function.     Plan to perform manual Tx again to release abdominal scar restrictions at next session on R side of abdomen .      Patient will benefit from skilled physical therapy to increase functional mobility, strength, and independence with ADLs.     OBJECTIVE IMPAIRMENTS: Abnormal gait, decreased activity tolerance, decreased balance, decreased coordination, decreased endurance, decreased mobility, difficulty walking, decreased strength, dizziness, impaired perceived functional ability, increased muscle spasms, impaired flexibility, improper body mechanics, and postural dysfunction.   ACTIVITY LIMITATIONS: carrying, lifting, bending, sitting, standing, squatting, sleeping, stairs, transfers, bed mobility, bathing, toileting, dressing, reach over head, hygiene/grooming, locomotion level, and caring for others  PARTICIPATION LIMITATIONS: meal prep, cleaning, laundry, interpersonal relationship, driving, shopping, community activity, yard work, and church  PERSONAL FACTORS: Age, Past/current experiences, Time since onset of injury/illness/exacerbation, Transportation, and 3+ comorbidities: adrenal insufficiency, Hereditary spastic paraplegia (HSP), small fiber neuropathy c/b BLE paraplegia and neurogenic bladder s/p cystectomy w/ ileal condiuit (05/2022), chronic pain, migraine are also affecting patient's functional outcome.    REHAB POTENTIAL: Good  CLINICAL DECISION MAKING: Evolving/moderate complexity  EVALUATION COMPLEXITY: Moderate  PLAN:  PT FREQUENCY: 2x/week  PT DURATION: 12 weeks  PLANNED INTERVENTIONS: 97164- PT Re-evaluation, 97110-Therapeutic exercises, 97530- Therapeutic activity, [id number]- Neuromuscular re-education, 97535- Self Care, 29562- Manual therapy, [id number]- Gait training, [phone]- Orthotic Fit/training, [phone]- Canalith repositioning, [id number]- Splinting, [id number]- Electrical stimulation (unattended), [phone]- Electrical stimulation (manual), [id number]- Vasopneumatic device, [id number]- Ultrasound, [id number]- Traction (mechanical), [id number]- Ionotophoresis 4mg /ml Dexamethasone , Patient/Family education, Balance training, Stair training, Taping, Dry Needling, Joint mobilization, Joint manipulation, Spinal manipulation, Spinal mobilization, Scar mobilization, Compression bandaging, Vestibular training, Visual/preceptual remediation/compensation, DME instructions, Wheelchair mobility training, Cryotherapy, Moist heat, and Biofeedback  PLAN FOR NEXT SESSION: Address R side of abdomen fascial restrictions    [place] Andreas, PT 08/23/2023, 12:16 PM

## 2023-08-23 NOTE — Therapy (Signed)
 OUTPATIENT OCCUPATIONAL THERAPY NEURO TREATMENT NOTE  Patient Name: Christine Chavez MRN: [id number] DOB:06-22-1980, 43 y.o., female Today's Date: 08/23/2023  PCP: Madaline Scales, PA-C REFERRING PROVIDER: Rosan Comfort, MD  END OF SESSION:  OT End of Session - 08/23/23 1444     Visit Number 9    Number of Visits 24    Date for OT Re-Evaluation 09/19/23    OT Start Time 1145    OT Stop Time 1230    OT Time Calculation (min) 45 min    Activity Tolerance Patient tolerated treatment well    Behavior During Therapy WFL for tasks assessed/performed             Past Medical History:  Diagnosis Date   Bladder retention    Complication of anesthesia    ? seizures after anesthesia    Depression    Diarrhea due to malabsorption    Dizziness    Headache    Migraines    Neurogenic bladder    Pyelonephritis    Renal disorder    Vision abnormalities    Weight loss    Past Surgical History:  Procedure Laterality Date   ANTERIOR CRUCIATE LIGAMENT REPAIR  1997   APPENDECTOMY     BLADDER REMOVAL     COLONOSCOPY WITH PROPOFOL  N/A 11/11/2018   Procedure: COLONOSCOPY WITH PROPOFOL ;  Surgeon: Deveron Fly, MD;  Location: [facility] ENDOSCOPY;  Service: Endoscopy;  Laterality: N/A;   CYSTOSCOPY WITH STENT PLACEMENT Right 04/17/2016   Procedure: CYSTOSCOPY WITH STENT PLACEMENT;  Surgeon: Marco Severs, MD;  Location: [facility] ORS;  Service: Urology;  Laterality: Right;   ESOPHAGOGASTRODUODENOSCOPY (EGD) WITH PROPOFOL  N/A 11/11/2018   Procedure: ESOPHAGOGASTRODUODENOSCOPY (EGD) WITH PROPOFOL ;  Surgeon: Deveron Fly, MD;  Location: [facility] ENDOSCOPY;  Service: Endoscopy;  Laterality: N/A;   EXPLORATORY LAPAROTOMY  1999   HIP SURGERY Right    KIDNEY STONE SURGERY  04/2016   REVISION UROSTOMY CUTANEOUS     REVISION UROSTOMY CUTANEOUS  01/10/2018   SUPRAPUBIC CATHETER PLACEMENT  08/2017   TONSILLECTOMY     Patient Active Problem List   Diagnosis Date Noted   Stroke-like  symptoms 06/13/2023   Refractory nausea and vomiting 02/22/2023   Syncope and collapse 02/19/2023   Palliative care by specialist 02/19/2023   UTI (urinary tract infection) 02/13/2023   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   Neurogenic bladder 02/13/2023   Hepatic steatosis 02/13/2023   Wheelchair dependent 02/13/2023   Secondary adrenal insufficiency (HCC) 02/13/2023   Right flank pain 02/13/2023   Iron deficiency anemia 10/02/2022   Seizure (HCC) 11/11/2018   Major depressive disorder, recurrent episode, moderate (HCC) 02/07/2018   Nephrolithiasis 04/16/2016   Numbness 07/28/2015   Bladder retention 06/23/2015   Abdominal pain 06/04/2015   Dizziness 05/18/2015   Neck pain 05/18/2015   Complicated migraine 04/28/2015   Other fatigue 04/28/2015   Abnormal finding on MRI of brain 04/28/2015   Diarrhea 03/29/2015   H/O disease 03/29/2015   Abnormal weight loss 03/29/2015   Muscle weakness (generalized) 03/14/2015   Headache, migraine 03/10/2015    ONSET DATE: 06/13/23  REFERRING DIAG: CVA, HSP  THERAPY DIAG:  Muscle weakness (generalized)  Rationale for Evaluation and Treatment: Rehabilitation  SUBJECTIVE:   SUBJECTIVE STATEMENT:  Pt. reports she is planning to go to the beach for her anniversary next week.   Pt accompanied by: significant other Husband: Paul  PERTINENT HISTORY:   Pt. is a 43 y.o. female who was hospitalized with a CVA  from 06/13/23-06/14/23.  Imaging revealed two small acute cortical infarcts within the posterior Right frontal lobe (with involvement in the motor strip). PMHx includes: HSP With a  recent hospitalization from January 13-February 19th at [facility] 2/2 a Neuro Flare from the HSP. Hx of small fiber neuropathy d/t BLE paraplegia, Neurogenic bladder s/p cystectomy with ideal conduit (2/24) ,chronic pain, migraine. Pt. Reports multiple hospitalizations of the past year.  PRECAUTIONS: None  WEIGHT BEARING RESTRICTIONS:  No  PAIN:  Are you having pain? No   FALLS: Has patient fallen in last 6 months? Yes. Number of falls 3  LIVING ENVIRONMENT: Lives with: lives with their family Lives in: House/apartment, One level Stairs:  ramp. Has following equipment at home: Otho Blitz - 4 wheeled, Wheelchair (power), Wheelchair (manual), Shower bench, and bed side commode, bed rail  PLOF: Independent  PATIENT GOALS:  Regain strength, and hand coordination  OBJECTIVE:  Note: Objective measures were completed at Evaluation unless otherwise noted.  HAND DOMINANCE: Right  ADLs: Overall ADLs:  Transfers/ambulation related to ADLs: Eating:  Independent, Cutting. Difficulty opening  Grooming: Independent UB Dressing: Independent LB Dressing: Independent, set-up Toileting:  Independent, Ostomy Bag Bathing: Independent once in the shower            Shower transfers: Husband assists with shower transfers.   IADLs: Shopping: Orders online. Husband picks them up Light housekeeping: Family assists Meal Prep: Engineer, structural, Assist with meals, cutting Community mobility: Relies on family, and friends Medication management: Independent Financial management: No change Handwriting: 100% legible, Pain/fatigues with writing note cards. Hobbies: Making gifts baskets; shopping ([employer] is a favorite)  MOBILITY STATUS: Uses a w/c  POSTURE COMMENTS:   Sitting balance:  WFL  ACTIVITY TOLERANCE: Activity tolerance: limited  FUNCTIONAL OUTCOME MEASURES: MAM-20: Sum score: 73/80; MAM Measure score: 71.8  UPPER EXTREMITY ROM:    Active ROM Right Eval WFL Left Eval [facility]  Shoulder flexion    Shoulder abduction    Shoulder adduction    Shoulder extension    Shoulder internal rotation    Shoulder external rotation    Elbow flexion    Elbow extension    Wrist flexion    Wrist extension    Wrist ulnar deviation    Wrist radial deviation    Wrist pronation    Wrist supination    (Blank rows = not tested)  UPPER  EXTREMITY MMT:     MMT Right eval Left eval  Shoulder flexion 3+/5 3+/5  Shoulder abduction 3+/5 3+/5  Shoulder adduction    Shoulder extension    Shoulder internal rotation    Shoulder external rotation    Middle trapezius    Lower trapezius    Elbow flexion 3+/5 3+/5  Elbow extension 3+/5 3+/5  Wrist flexion    Wrist extension 3+/5 3+/5  Wrist ulnar deviation    Wrist radial deviation    Wrist pronation 3+/5 3+/5  Wrist supination 3+/5 3+/5  (Blank rows = not tested)  HAND FUNCTION: Grip strength: Right: 5 lbs; Left: 2 lbs and Lateral pinch: Right: 5 lbs, Left: 2 lbs, 3pt. Pinch: R: 5 lbs, L: 2 lbs; 2pt. Pinch: R: 1 lbs, L: 1lb  COORDINATION: 9 Hole Peg test: Right: 25 sec; Left: 31 sec  SENSATION: WFL light touch, and proprioceptive awareness  EDEMA: N/A   COGNITION: Overall cognitive status: Within functional limits for tasks assessed  VISION: Subjective report: Reports blurriness  PERCEPTION: WFL  PRAXIS: WFL  TREATMENT DATE: 08/23/2023   Therapeutic Ex.:   -Pt. worked on [employer], and reciprocal motion using the UBE while seated for 8 min. with minimal resistance. Constant monitoring was provided.  -Performed BUE strengthening using 3# hand weight for elbow flexion, and extension, and  forearm supination/pronation, 2# weights for wrist flexion/extension, and radial deviation.   -Digit flexion using the 1.5# DigiFlex for each digit individually, as well as simultaneously  Neuromuscular re-education:   -[facility] skills grasping one inch resistive cubes alternating thumb opposition to the tip of the 2nd through 5th digits. The board was positioned at a vertical angle. Pt. Worked on pressing them back into place while isolating 2nd through 5th digits.   PATIENT EDUCATION: Education details: UE functioning, strengthening Person  educated: Patient and Spouse Education method: Museum/gallery conservator cues,verbal cues Education comprehension: verbalized understanding and needs further education  HOME EXERCISE PROGRAM:  Continue ongoing assessment, and provide as indicated   GOALS: Goals reviewed with patient? Yes  SHORT TERM GOALS: Target date: 08/08/2023    Pt. Will be independent with HEPs for BUEs. Baseline:No current HEP Goal status: INITIAL   LONG TERM GOALS: Target date: 09/19/2023    Pt. Will be improve BUE strength by 2 mm grades to be able to independently reach for ADL/IADL items in multiple planes. Baseline: Eval: BUE strength: 3+/5 overall  Goal status: INITIAL  2.  Pt. will improve bilateral grip strength to be able to securely hold items. Baseline: Eval: R: 5#, L: 2# Goal status: INITIAL  3.  Pt. Will improve bilateral lateral pinch strength by 3#  to be able to open medication bottles Baseline: Eval: R: 5#, L: 2# Goal status: INITIAL  4.  Pt. Will improve bilateral 3pt. pinch strength to be able to independently use a nail clipper Baseline: Eval: R: 3#, L: 2# Goal status: INITIAL  5.  Pt. Will will improve [facility] skills to be able to manipulate small objects. Baseline: Eval: R: 25 sec., L: 31 sec. Goal status: INITIAL  ASSESSMENT:  CLINICAL IMPRESSION:  Pt. reported hand fatigue from curling ribbon for her Mother's Day, and teacher gift baskets. Pt. was able to tolerate the UE exercises well today, and was able perform the UE strengthening exercises, UBE, and FMC tasks without difficulty today. Pt. continues to benefit from OT services to work on improving BUE functioning in order to improve engagement in, and participation in ADL, and IADL functioning. Plan to perform progress update at the next session.  PERFORMANCE DEFICITS: in functional skills including ADLs, IADLs, coordination, dexterity, proprioception, ROM, strength, Fine motor control, Gross motor control, and UE  functional use, cognitive skills including , and psychosocial skills including environmental adaptation, routines, and behaviors.   IMPAIRMENTS: are limiting patient from ADLs, IADLs, and leisure.   CO-MORBIDITIES: may have co-morbidities  that affects occupational performance. Patient will benefit from skilled OT to address above impairments and improve overall function.  MODIFICATION OR ASSISTANCE TO COMPLETE EVALUATION: Min-Moderate modification of tasks or assist with assess necessary to complete an evaluation.  OT OCCUPATIONAL PROFILE AND HISTORY: Detailed assessment: Review of records and additional review of physical, cognitive, psychosocial history related to current functional performance.  CLINICAL DECISION MAKING: Moderate - several treatment options, min-mod task modification necessary  REHAB POTENTIAL: Good  EVALUATION COMPLEXITY: Moderate    PLAN:  OT FREQUENCY: 2x/week  OT DURATION: 12 weeks  PLANNED INTERVENTIONS: 97168 OT Re-evaluation, 97535 self care/ADL training, 08657 therapeutic exercise, 97530 therapeutic activity, 97112 neuromuscular re-education, 97018 paraffin,  16109 contrast bath, [phone] Orthotics management and training, 09811 Splinting (initial encounter), functional mobility training, patient/family education, and DME and/or AE instructions  RECOMMENDED OTHER SERVICES: PT  CONSULTED AND AGREED WITH PLAN OF CARE: Patient and family member/caregiver  PLAN FOR NEXT SESSION: Treatment  Haniah Penny, MS, OTR/L  08/23/2023, 2:47 PM

## 2023-08-23 NOTE — Patient Instructions (Signed)
 Stretches :    _angel wings, lower elbows down , keep arms touching bed  10 reps    _ZigZag stretch w/ gentle pull by scar   Reclined twist for hips and side of the hips/ legs  Lay on your back, knees bend, dig elbows and feet into bed to exhale and lift buttocks up to  Scoot hips to the L , leave shoulders in place Wobble knees to the R side 45 deg and to midline  10 reps   ______________     Restorative yoga -Reclined butterfly  Bolster with pillow folded under to create a ramp for your back  roled towed under neckand head  Butterfly legs, pillows under knees throw pillows under forearms 10 -15 min    __

## 2023-08-27 ENCOUNTER — Ambulatory Visit: Admitting: Occupational Therapy

## 2023-08-27 ENCOUNTER — Ambulatory Visit: Payer: BC Managed Care – PPO

## 2023-08-28 ENCOUNTER — Ambulatory Visit: Admitting: Cardiovascular Disease

## 2023-08-28 NOTE — Progress Notes (Signed)
 Cardiology Office Note  Date:  09/03/2023   ID:  [id number] [place], [place] March 10, 1981, MRN [id number]  PCP:  Madaline Scales, PA-C   Chief Complaint  Patient presents with   New Patient (Initial Visit)    Ref by Madaline Scales, PA follow up stroke; [facility] 06/13/2023. Patient wore a Zio monitor and returned it on 06/29/2023.     HPI:  [name] is a 43 year old woman with past medical history of Paraplegia Stroke Syncope Adrenal insufficiency Chronic pain on chronic opiates Bladder removed Who presents by referral from Madaline Scales for consultation of her history of stroke  In hospital Jan to feb 2025, UTI at [facility] of Feb 2025: sitting in bathroom, developed neurologic symptoms, starting with left hand not working well, Pins and needles in face, trouble speaking Was referred to emergency room  Diagnosed with acute infarcts, confirmed with imaging --MRI brain pos for "Two small acute cortical infarcts within the posterior right frontal lobe (with involvement of the motor strip)" and "Adjacent small acute infarct within the posterior right frontal lobe white matter."  Additional workup including --Echo unremarkable and showed no atrial level shunt.   --due to stroke in young age, hypercoagulable workup ordered by neuro. --avoid rizatriptan  with stroke. --pt was discharged on Plavix  (pt allergic to ASA), lipitor, and cardiac monitor.  Outpatient neuro f/u.  Echo with ejection fraction 60 to 65%, normal RV size and function, no significant valvular heart disease, normal pressures, Agitated saline contrast bubble study was negative, with no evidence of any interatrial shunt.   Normal bilateral carotid duplex ultrasound.   Zio monitor February 2025 Average heart rate of 80 bpm, 1 episode of elevated heart rate longest lasting 4 beats, no atrial fibrillation no atrial flutter no sustained arrhythmias.  No findings to suggest syncope with collapse.     Continues to recover,  does physical therapy, residual deficits on the left  EKG personally reviewed by myself on todays visit EKG Interpretation Date/Time:  Monday Sep 03 2023 10:17:12 EDT Ventricular Rate:  82 PR Interval:  144 QRS Duration:  66 QT Interval:  374 QTC Calculation: 436 R Axis:   13  Text Interpretation: Normal sinus rhythm Low voltage QRS No significant change was found Confirmed by Belva Boyden [phone]) on 09/03/2023 10:30:41 AM    PMH:   has a past medical history of Bladder retention, Complication of anesthesia, Depression, Diarrhea due to malabsorption, Dizziness, Headache, Migraines, Neurogenic bladder, Pyelonephritis, Renal disorder, Vision abnormalities, and Weight loss.  PSH:    Past Surgical History:  Procedure Laterality Date   ANTERIOR CRUCIATE LIGAMENT REPAIR  1997   APPENDECTOMY     BLADDER REMOVAL     COLONOSCOPY WITH PROPOFOL  N/A 11/11/2018   Procedure: COLONOSCOPY WITH PROPOFOL ;  Surgeon: Deveron Fly, MD;  Location: [facility] ENDOSCOPY;  Service: Endoscopy;  Laterality: N/A;   CYSTOSCOPY WITH STENT PLACEMENT Right 04/17/2016   Procedure: CYSTOSCOPY WITH STENT PLACEMENT;  Surgeon: Marco Severs, MD;  Location: [facility] ORS;  Service: Urology;  Laterality: Right;   ESOPHAGOGASTRODUODENOSCOPY (EGD) WITH PROPOFOL  N/A 11/11/2018   Procedure: ESOPHAGOGASTRODUODENOSCOPY (EGD) WITH PROPOFOL ;  Surgeon: Deveron Fly, MD;  Location: [facility] ENDOSCOPY;  Service: Endoscopy;  Laterality: N/A;   EXPLORATORY LAPAROTOMY  1999   HIP SURGERY Right    KIDNEY STONE SURGERY  04/2016   REVISION UROSTOMY CUTANEOUS     REVISION UROSTOMY CUTANEOUS  01/10/2018   SUPRAPUBIC CATHETER PLACEMENT  08/2017   TONSILLECTOMY  Current Outpatient Medications  Medication Sig Dispense Refill   atorvastatin  (LIPITOR) 40 MG tablet Take 1 tablet (40 mg total) by mouth daily. 30 tablet 2   baclofen  (LIORESAL ) 20 MG tablet Take 20 mg by mouth 2 (two) times daily.     buPROPion  (WELLBUTRIN  XL) 150  MG 24 hr tablet Take 150 mg by mouth daily.     Cholecalciferol  (D 1000) 25 MCG (1000 UT) capsule Take 1,000 Units by mouth daily.     clopidogrel  (PLAVIX ) 75 MG tablet Take 1 tablet (75 mg total) by mouth daily. 30 tablet 2   Cyanocobalamin  (VITAMIN B-12) 2500 MCG SUBL Take 2,500 mcg by mouth daily.     EPINEPHrine 0.3 mg/0.3 mL IJ SOAJ injection Inject 0.3 mg into the muscle as needed for anaphylaxis.     Fremanezumab-vfrm 225 MG/1.5ML SOAJ Inject 225 mg into the skin See admin instructions.     hydrocortisone  (CORTEF ) 10 MG tablet Take 10 mg by mouth every morning.     hydrocortisone  (CORTEF ) 5 MG tablet Take 5 mg by mouth every evening. Per pt, taken b/t 3 and 5 pm consistently Double dose per sick day dose     HYDROmorphone  (DILAUDID ) 2 MG tablet Take 2 mg by mouth every 12 (twelve) hours as needed for moderate pain (pain score 4-6).     hydrOXYzine  (ATARAX ) 25 MG tablet Take 25 mg by mouth 3 (three) times daily as needed for itching.     METHENAMINE HIPPURATE PO Take 1 g by mouth 2 (two) times daily.     mirtazapine  (REMERON ) 15 MG tablet Take 15 mg by mouth at bedtime.     naloxone (NARCAN) nasal spray 4 mg/0.1 mL Place 1 spray into the nose once as needed (Suspected Opiod Overdose).     norethindrone  (AYGESTIN ) 5 MG tablet Take 5 mg by mouth daily.     ondansetron  (ZOFRAN -ODT) 8 MG disintegrating tablet Take 8 mg by mouth every 6 (six) hours as needed for nausea or vomiting.     pregabalin  (LYRICA ) 100 MG capsule Take 100 mg by mouth 2 (two) times daily.     promethazine  (PHENERGAN ) 12.5 MG suppository Place 1 suppository (12.5 mg total) rectally every 6 (six) hours as needed for nausea or vomiting. 12 each 0   promethazine  (PHENERGAN ) 25 MG tablet Take 25 mg by mouth every 8 (eight) hours as needed for nausea or vomiting.     SOLU-CORTEF  100 MG injection Inject 100 mg into the muscle once as needed (Adrenal Emergency).     tamsulosin  (FLOMAX ) 0.4 MG CAPS capsule Take 0.4 mg by mouth 2  (two) times daily as needed.     tiZANidine  (ZANAFLEX ) 2 MG tablet Take 2 mg by mouth as directed. Per pt, taken nightly, then as needed for breakthrough     UBRELVY  50 MG TABS Take 50 mg by mouth daily as needed.     linaclotide  (LINZESS ) 72 MCG capsule Take 72 mcg by mouth daily as needed. (Patient not taking: Reported on 09/03/2023)     pantoprazole  (PROTONIX ) 40 MG tablet Take 40 mg by mouth 2 (two) times daily.     No current facility-administered medications for this visit.     Allergies:   Aspirin, Ibuprofen, Ketorolac, Levofloxacin, Morphine , Nsaids, Vibegron, Bupropion , Morphine  and codeine, Vancomycin, Iron dextran, Methenamine hippurate, and Prochlorperazine    Social History:  The patient  reports that she has never smoked. She has never used smokeless tobacco. She reports that she does not drink alcohol and  does not use drugs.   Family History:   family history includes Arthritis/Rheumatoid in her paternal grandmother; Atrial fibrillation in her father; Depression in her brother; Healthy in her brother and brother; Hypertension in her mother.    Review of Systems: Review of Systems  Constitutional: Negative.   HENT: Negative.    Respiratory: Negative.    Cardiovascular: Negative.   Gastrointestinal: Negative.   Musculoskeletal: Negative.   Neurological: Negative.        Weakness on the left  Psychiatric/Behavioral: Negative.    All other systems reviewed and are negative.   PHYSICAL EXAM: VS:  BP 110/78 (BP Location: Right Arm, Patient Position: Sitting, Cuff Size: Normal)   Pulse 82   Ht 5\' 7"  (1.702 m)   Wt 146 lb (66.2 kg)   LMP 02/18/2021   SpO2 97%   BMI 22.87 kg/m  , BMI Body mass index is 22.87 kg/m. GEN: Well nourished, well developed, in no acute distress HEENT: normal Neck: no JVD, carotid bruits, or masses Cardiac: RRR; no murmurs, rubs, or gallops,no edema  Respiratory:  clear to auscultation bilaterally, normal work of breathing GI: soft,  nontender, nondistended, + BS MS: no deformity or atrophy Skin: warm and dry, no rash Neuro:  Strength and sensation are intact Psych: euthymic mood, full affect  Recent Labs: 06/13/2023: ALT 13; BUN 9; Creatinine, Ser 0.86; Hemoglobin 13.8; Platelets 296; Potassium 4.0; Sodium 137   Lipid Panel No results found for: "CHOL", "HDL", "LDLCALC", "TRIG"  Total chol 145, LDL 88  Wt Readings from Last 3 Encounters:  09/03/23 146 lb (66.2 kg)  06/13/23 141 lb 1.5 oz (64 kg)  02/13/23 142 lb (64.4 kg)    ASSESSMENT AND PLAN:  Problem List Items Addressed This Visit     Stroke-like symptoms   Relevant Orders   EKG 12-Lead (Completed)   Hereditary spastic paraplegia (HCC) - Primary   Relevant Orders   EKG 12-Lead (Completed)    History of stroke Etiology at this point remains unclear Zio monitor reviewed No documented cardiac arrhythmia, no PAD/carotid disease Essentially normal echocardiogram with negative bubble study Discussed loop monitor versus aggressive monitoring using her Apple Watch for  arrhythmia She will use her Apple Watch for now with close monitoring for A-fib/flutter Recommend she send us  rhythm strips concerning for arrhythmia through the MyChart system Currently on Plavix  Blood pressure well-controlled  Patient seen in consultation for Madaline Scales and will be referred back to her office for ongoing care of the issues detailed above  Signed, Juanda Noon, M.D., Ph.D. [facility] [place], [place] [phone]

## 2023-08-29 ENCOUNTER — Encounter: Admitting: Occupational Therapy

## 2023-08-29 ENCOUNTER — Ambulatory Visit: Payer: BC Managed Care – PPO

## 2023-09-03 ENCOUNTER — Encounter: Payer: Self-pay | Admitting: Cardiovascular Disease

## 2023-09-03 ENCOUNTER — Ambulatory Visit: Admitting: Occupational Therapy

## 2023-09-03 ENCOUNTER — Ambulatory Visit: Attending: Cardiovascular Disease | Admitting: Cardiovascular Disease

## 2023-09-03 ENCOUNTER — Ambulatory Visit: Payer: BC Managed Care – PPO | Admitting: Physical Therapy

## 2023-09-03 VITALS — BP 110/78 | HR 82 | Ht 67.0 in | Wt 146.0 lb

## 2023-09-03 DIAGNOSIS — R2990 Unspecified symptoms and signs involving the nervous system: Secondary | ICD-10-CM | POA: Diagnosis not present

## 2023-09-03 DIAGNOSIS — G114 Hereditary spastic paraplegia: Secondary | ICD-10-CM | POA: Diagnosis not present

## 2023-09-03 NOTE — Patient Instructions (Addendum)
Medication Instructions:  No changes  If you need a refill on your cardiac medications before your next appointment, please call your pharmacy.   Lab work: No new labs needed  Testing/Procedures: No new testing needed  Follow-Up: At [facility], you and your health needs are our priority.  As part of our continuing mission to provide you with exceptional heart care, we have created designated Provider Care Teams.  These Care Teams include your primary Cardiologist (physician) and Advanced Practice Providers (APPs -  Physician Assistants and Nurse Practitioners) who all work together to provide you with the care you need, when you need it.  You will need a follow up appointment as needed  Providers on your designated Care Team:   Christopher Berge, NP Ryan Dunn, PA-C Cadence Furth, PA-C  COVID-19 Vaccine Information can be found at: [email or web address] For questions related to vaccine distribution or appointments, please email [email or web address] or call [phone].    

## 2023-09-05 ENCOUNTER — Ambulatory Visit: Admitting: Occupational Therapy

## 2023-09-05 ENCOUNTER — Ambulatory Visit: Payer: BC Managed Care – PPO

## 2023-09-12 ENCOUNTER — Encounter: Admitting: Occupational Therapy

## 2023-09-12 ENCOUNTER — Ambulatory Visit: Payer: BC Managed Care – PPO

## 2023-09-13 NOTE — Therapy (Incomplete)
 OUTPATIENT PHYSICAL THERAPY NEURO TREATMENT   Patient Name: Christine Chavez MRN: [id number] DOB:Sep 03, 1980, 43 y.o., female Today's Date: 09/13/2023   PCP: Madaline Scales REFERRING PROVIDER: Devora Folks  END OF SESSION:       Past Medical History:  Diagnosis Date   Bladder retention    Complication of anesthesia    ? seizures after anesthesia    Depression    Diarrhea due to malabsorption    Dizziness    Headache    Migraines    Neurogenic bladder    Pyelonephritis    Renal disorder    Vision abnormalities    Weight loss    Past Surgical History:  Procedure Laterality Date   ANTERIOR CRUCIATE LIGAMENT REPAIR  1997   APPENDECTOMY     BLADDER REMOVAL     COLONOSCOPY WITH PROPOFOL  N/A 11/11/2018   Procedure: COLONOSCOPY WITH PROPOFOL ;  Surgeon: Deveron Fly, MD;  Location: [facility] ENDOSCOPY;  Service: Endoscopy;  Laterality: N/A;   CYSTOSCOPY WITH STENT PLACEMENT Right 04/17/2016   Procedure: CYSTOSCOPY WITH STENT PLACEMENT;  Surgeon: Marco Severs, MD;  Location: [facility] ORS;  Service: Urology;  Laterality: Right;   ESOPHAGOGASTRODUODENOSCOPY (EGD) WITH PROPOFOL  N/A 11/11/2018   Procedure: ESOPHAGOGASTRODUODENOSCOPY (EGD) WITH PROPOFOL ;  Surgeon: Deveron Fly, MD;  Location: [facility] ENDOSCOPY;  Service: Endoscopy;  Laterality: N/A;   EXPLORATORY LAPAROTOMY  1999   HIP SURGERY Right    KIDNEY STONE SURGERY  04/2016   REVISION UROSTOMY CUTANEOUS     REVISION UROSTOMY CUTANEOUS  01/10/2018   SUPRAPUBIC CATHETER PLACEMENT  08/2017   TONSILLECTOMY     Patient Active Problem List   Diagnosis Date Noted   Stroke-like symptoms 06/13/2023   Refractory nausea and vomiting 02/22/2023   Syncope and collapse 02/19/2023   Palliative care by specialist 02/19/2023   UTI (urinary tract infection) 02/13/2023   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   Neurogenic bladder 02/13/2023   Hepatic steatosis 02/13/2023   Wheelchair dependent 02/13/2023    Secondary adrenal insufficiency (HCC) 02/13/2023   Right flank pain 02/13/2023   Iron deficiency anemia 10/02/2022   Seizure (HCC) 11/11/2018   Major depressive disorder, recurrent episode, moderate (HCC) 02/07/2018   Nephrolithiasis 04/16/2016   Numbness 07/28/2015   Bladder retention 06/23/2015   Abdominal pain 06/04/2015   Dizziness 05/18/2015   Neck pain 05/18/2015   Complicated migraine 04/28/2015   Other fatigue 04/28/2015   Abnormal finding on MRI of brain 04/28/2015   Diarrhea 03/29/2015   H/O disease 03/29/2015   Abnormal weight loss 03/29/2015   Muscle weakness (generalized) 03/14/2015   Headache, migraine 03/10/2015    ONSET DATE: 06/13/23  REFERRING DIAG: CVA  THERAPY DIAG:  No diagnosis found.  Rationale for Evaluation and Treatment: Rehabilitation  SUBJECTIVE:  SUBJECTIVE STATEMENT:  ***  Pt was  accompanied by: significant other  ( not today)      PERTINENT HISTORY:  Patient is referred to PT s/p CVA.  Patient presents to PT s/p CVA on 06/13/23.  Pmh includes adrenal insufficiency, Hereditary spastic paraplegia (HSP), small fiber neuropathy c/b BLE paraplegia and neurogenic bladder s/p cystectomy w/ ileal condiuit (05/2022), chronic pain, migraine. Patient was recently hospitalized prior to this at [facility] from 04/30/23 to 05/27/23. Patient reports L sided weakness. Is having stabbing pains for a few minutes.   Patient presents to pelvic PT and provides this information:  Pt was sent to ED March 31 at [facility] for R abdominal pain below her stoma. Low blood flow to her ovary . Pt saw her gynecologist yesterday and they are going to try to a tap block to address the pain which she is caused by multifactorial issues.  Pt had hysterectomy in 2024 and fallopian tubes and  uterus , bladder and urethra were removed. Ovaries remained to prevent from early menopause.  Pt had kidney infections that led her be hospitalized for Jan 13- Feb 9  . During that time, pt learned she had endometrioma on the R ovary. Pt was placed on hormones for shrink the endometrioma. Pain has escalated with the hormone treatment and the endometrioma grew in size. Due to the   hormone Tx was not helping, they  will be replaced by Lupron once insurances approves. Pt is not wanting to take Lupron.              R abdominal pain below her stoma : Pain is intermittent. There is throbbing pain. The pain triggers nausea 4 x within the last 5 days.  Due to the nausea, pt has not had much of an appetite. Pt has daily BM but it is like IBS, sometimes loose and sometimes hard stools. Stool consistency 50% Type 6-7, 50%  Type 4-5    It has not escalated to the level that lead to the ED visit and the pain readiated to the pelvic area "like labor pains but no baby" .  Baseline 6-7/10 pain lately and is localized at the flank and groin not pelvic area. Near her stoma bag. When she removes her stoma bag, there is no pain. The pain was triggered with leaning back too far and lifting legs for an exercise. There is not rhyme or reason when the pain occurs. It also occurs when sleeping . It does not matter if she is on her back or side. 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  Pt has tried vaginal Valium  and it helped some but she found it has sedative effect and only uses when she is in desperate situation.  Pt feels the pain is less related to pelvic floor.    Pt have abdominal scars where 2 surgeries were performed.    Gynecologist referred her for Pelvic PT. Pt would like to achieve to 3/10 pain and be able to attend children's activities. Pt would like to have less pain when undergoing vaginal US  exam because they were very painful.    Decrease pain triggering nausea from 4 x  within the last 5 days to < 2 x with one week.  Decrease scar adhesion.   Stool consistency 50% Type 6-7, 50%  Type 4-5   PAIN:  Are you having pain? Has been having sharp stabbing pains, throbbing pain at the flank and  R groin area   PRECAUTIONS: Fall  RED FLAGS: None and hx of bladder removal     WEIGHT BEARING RESTRICTIONS: No  FALLS: Has patient fallen in last 6 months? Yes. Number of falls 3  LIVING ENVIRONMENT: Lives with: lives with their family and lives with their spouse Lives in: House/apartment Stairs: ramp Has following equipment at home: Wheelchair (manual) and Ramped entry  PLOF: Independent with household mobility with device  PATIENT GOALS: to get stronger, do ADLs better.   OBJECTIVE:  Note: Objective measures were completed at Evaluation unless otherwise noted.  DIAGNOSTIC FINDINGS:  MRI results received from [facility] "1. 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 2. Adjacent small acute infarct within the posterior right frontal lobe white matter. 3. Background moderate multifocal T2 FLAIR hyperintense signal abnormality within the cerebral white matter (with a subcortical white matter predominance), greater than expected for age. These signal changes are nonspecific and differential considerations include accelerated chronic small vessel ischemic disease, sequelae of chronic migraine headaches, sequelae of vasculitis/hypercoagulable state and sequelae of a prior infectious/inflammatory process, among others.   MRA head:   Significantly motion degraded examination. No proximal intracranial large vessel occlusion is identified. Motion artifact precludes accurate assessment for stenoses and for vessel irregularity.    COGNITION: Overall cognitive status: Within functional limits for tasks assessed   SENSATION: Light touch: Impaired ; below R knee has decreased  sensation   COORDINATION: Heel shin: tonic shaking movements    MUSCLE TONE: LLE: Moderate RLE Moderate   POSTURE: forward head, increased thoracic kyphosis, posterior pelvic tilt, and flexed trunk     LOWER EXTREMITY MMT:    MMT Right Eval Left Eval  Hip flexion 6.2 5.6  Hip extension    Hip abduction 6.5 6.4  Hip adduction 3.4 3.4  Hip internal rotation    Hip external rotation    Knee flexion 5.1 1.0  Knee extension 7.1 3.2  Ankle dorsiflexion    Ankle plantarflexion    Ankle inversion    Ankle eversion    (Blank rows = not tested)  BED MOBILITY:  Will assess in future session.  TRANSFERS: Assistive device utilized: Environmental consultant - 4 wheeled  Sit to stand: CGA Stand to sit: CGA Chair to chair: Min A     GAIT: Gait pattern: step to pattern, knee flexed in stance- Right, knee flexed in stance- Left, ataxic, trunk flexed, poor foot clearance- Right, and poor foot clearance- Left Distance walked: 8 ft  Assistive device utilized: Walker - 4 wheeled Level of assistance: CGA and Min A Comments: Jerking tonic  gait pattern   FUNCTIONAL TESTS:  5 times sit to stand: 46.33 with hands  10 meter walk test: 1.04 for 8 ft; unable to finish  PATIENT SURVEYS:  Stroke Impact Scale give next session 3  TREATMENT DATE: 09/13/23     TherAct: Static stand at // bar 60 seconds x 2 trials march 10x each LE Sit to stand 10x; 5x then seated rest then 5x  Lateral step 10x each LE Dual task with word game reaching and visual scan x 8 minutes  Stand pivot transfer to/from wheelchair to/from table   TherEx Supine: Hamstring stretch 60 seconds each LE Single knee to chest 60 seconds each LE Figure four position 60 seconds each LE Thoracic rotation twists 10x;30 second holds at final rotation  Posterior pelvic tilt 10x BTB abduction 15x; 2 sets BTB  march 15x; 2 sets Heel slides 10x each LE  PATIENT EDUCATION: Education details: goals, POC  Person educated: Patient Education method: Explanation, Demonstration, Tactile cues, and Verbal cues Education comprehension: verbalized understanding, returned demonstration, verbal cues required, and tactile cues required  HOME EXERCISE PROGRAM: See pt instructions  GOALS: Goals reviewed with patient? Yes  SHORT TERM GOALS: Target date: 07/23/2023    Patient will be independent in home exercise program to improve strength/mobility for better functional independence with ADLs. Baseline: 3/10: give next session Goal status: Ongoing    LONG TERM GOALS: Target date: 10/24/2023    Patient (< 74 years old) will complete five times sit to stand test in < 10 seconds indicating an increased LE strength and improved balance. Baseline: 3/10: 46.33 Goal status: Ongoing  2.  Patient will perform 10 MWT under 2 minutes with no rest breaks indicating improved functional ambulation. Baseline: 3/10: 8 ft in 1 min 4 seconds Goal status: Ongoing  3.  Patient will tolerate standing 3 minutes for ADL performance Baseline: 3/08: knee buckling in standing Goal status: Ongoing  4.  Patient will increase BLE gross strength to 4+/5 and/or within 1lb of each limb  as to improve functional strength for independent gait, increased standing tolerance and increased ADL ability. Baseline: 3/10: see above  Goal status: Ongoing               5. Pt will demo increased ab scar mobility to achieve more relaxation of low abdomen and pelvic floor in order to experience <3/10 pain and be able to attend children's activities and  when undergoing vaginal US  exam because they were very painful.  Baseline:Gynecologist referred her for Pelvic PT. Pt would like to achieve to 3/10 pain and be able to attend children's activities. Pt would like to have less pain when undergoing vaginal US  exam because they were very painful.   Goal status  ONgoing             6.  Decrease pain triggering nausea from 4 x within the last 5 days to < 2 x with one week.                        Baseline:  Decrease pain triggering nausea from 4 x within the last 5 days to < 2 x with one week.     Goal status: ongoing   7. Pt will demo improved stool consistency Type 4-5 to  >75 % in order to minimize ab pain, promote GI , bowel and pelvic floor functio           Baseline:  Stool consistency 50% Type 6-7, 50%  Type 4-5            Goal status: Ongoing   ASSESSMENT:  CLINICAL IMPRESSION:  ***     Patient will benefit from skilled physical therapy  to increase functional mobility, strength, and independence with ADLs.     OBJECTIVE IMPAIRMENTS: Abnormal gait, decreased activity tolerance, decreased balance, decreased coordination, decreased endurance, decreased mobility, difficulty walking, decreased strength, dizziness, impaired perceived functional ability, increased muscle spasms, impaired flexibility, improper body mechanics, and postural dysfunction.   ACTIVITY LIMITATIONS: carrying, lifting, bending, sitting, standing, squatting, sleeping, stairs, transfers, bed mobility, bathing, toileting, dressing, reach over head, hygiene/grooming, locomotion level, and caring for others  PARTICIPATION LIMITATIONS: meal prep, cleaning, laundry, interpersonal relationship, driving, shopping, community activity, yard work, and church  PERSONAL FACTORS: Age, Past/current experiences, Time since onset of injury/illness/exacerbation, Transportation, and 3+ comorbidities: adrenal insufficiency, Hereditary spastic paraplegia (HSP), small fiber neuropathy c/b BLE paraplegia and neurogenic bladder s/p cystectomy w/ ileal condiuit (05/2022), chronic pain, migraine are also affecting patient's functional outcome.   REHAB POTENTIAL: Good  CLINICAL DECISION MAKING: Evolving/moderate complexity  EVALUATION COMPLEXITY: Moderate  PLAN:  PT FREQUENCY:  2x/week  PT DURATION: 12 weeks  PLANNED INTERVENTIONS: 97164- PT Re-evaluation, 97110-Therapeutic exercises, 97530- Therapeutic activity, 97112- Neuromuscular re-education, 97535- Self Care, 65784- Manual therapy, [phone]- Gait training, [phone]- Orthotic Fit/training, [phone]- Canalith repositioning, [id number]- Splinting, [id number]- Electrical stimulation (unattended), [phone]- Electrical stimulation (manual), [id number]- Vasopneumatic device, [id number]- Ultrasound, [id number]- Traction (mechanical), [id number]- Ionotophoresis 4mg /ml Dexamethasone , Patient/Family education, Balance training, Stair training, Taping, Dry Needling, Joint mobilization, Joint manipulation, Spinal manipulation, Spinal mobilization, Scar mobilization, Compression bandaging, Vestibular training, Visual/preceptual remediation/compensation, DME instructions, Wheelchair mobility training, Cryotherapy, Moist heat, and Biofeedback  PLAN FOR NEXT SESSION: Address R side of abdomen fascial restrictions    Tashi Band, PT 09/13/2023, 4:25 PM

## 2023-09-17 ENCOUNTER — Ambulatory Visit: Admitting: Occupational Therapy

## 2023-09-17 ENCOUNTER — Ambulatory Visit: Payer: BC Managed Care – PPO

## 2023-09-19 ENCOUNTER — Encounter: Admitting: Occupational Therapy

## 2023-09-19 ENCOUNTER — Ambulatory Visit: Payer: BC Managed Care – PPO | Attending: Neurology | Admitting: Physical Therapy

## 2023-09-19 DIAGNOSIS — M62838 Other muscle spasm: Secondary | ICD-10-CM | POA: Insufficient documentation

## 2023-09-19 DIAGNOSIS — M6281 Muscle weakness (generalized): Secondary | ICD-10-CM | POA: Diagnosis present

## 2023-09-19 DIAGNOSIS — M25551 Pain in right hip: Secondary | ICD-10-CM | POA: Diagnosis present

## 2023-09-19 DIAGNOSIS — R2681 Unsteadiness on feet: Secondary | ICD-10-CM | POA: Diagnosis present

## 2023-09-19 DIAGNOSIS — R278 Other lack of coordination: Secondary | ICD-10-CM | POA: Diagnosis present

## 2023-09-19 DIAGNOSIS — M25552 Pain in left hip: Secondary | ICD-10-CM | POA: Insufficient documentation

## 2023-09-19 DIAGNOSIS — R262 Difficulty in walking, not elsewhere classified: Secondary | ICD-10-CM | POA: Insufficient documentation

## 2023-09-19 NOTE — Therapy (Signed)
 OUTPATIENT PHYSICAL THERAPY NEURO TREATMENT   Patient Name: Christine Chavez MRN: [id number] DOB:Nov 14, 1980, 43 y.o., female Today's Date: 09/19/2023   PCP: Madaline Scales REFERRING PROVIDER: Devora Folks  END OF SESSION:  PT End of Session - 09/19/23 1342     Visit Number 11    Number of Visits 36    Date for PT Re-Evaluation 11/07/23   PN visit 10- 08/23/23   PT Start Time 1335    PT Stop Time 1415    PT Time Calculation (min) 40 min    Equipment Utilized During Treatment Gait belt    Activity Tolerance Patient tolerated treatment well;Patient limited by fatigue    Behavior During Therapy WFL for tasks assessed/performed                Past Medical History:  Diagnosis Date   Bladder retention    Complication of anesthesia    ? seizures after anesthesia    Depression    Diarrhea due to malabsorption    Dizziness    Headache    Migraines    Neurogenic bladder    Pyelonephritis    Renal disorder    Vision abnormalities    Weight loss    Past Surgical History:  Procedure Laterality Date   ANTERIOR CRUCIATE LIGAMENT REPAIR  1997   APPENDECTOMY     BLADDER REMOVAL     COLONOSCOPY WITH PROPOFOL  N/A 11/11/2018   Procedure: COLONOSCOPY WITH PROPOFOL ;  Surgeon: Deveron Fly, MD;  Location: [facility] ENDOSCOPY;  Service: Endoscopy;  Laterality: N/A;   CYSTOSCOPY WITH STENT PLACEMENT Right 04/17/2016   Procedure: CYSTOSCOPY WITH STENT PLACEMENT;  Surgeon: Marco Severs, MD;  Location: [facility] ORS;  Service: Urology;  Laterality: Right;   ESOPHAGOGASTRODUODENOSCOPY (EGD) WITH PROPOFOL  N/A 11/11/2018   Procedure: ESOPHAGOGASTRODUODENOSCOPY (EGD) WITH PROPOFOL ;  Surgeon: Deveron Fly, MD;  Location: [facility] ENDOSCOPY;  Service: Endoscopy;  Laterality: N/A;   EXPLORATORY LAPAROTOMY  1999   HIP SURGERY Right    KIDNEY STONE SURGERY  04/2016   REVISION UROSTOMY CUTANEOUS     REVISION UROSTOMY CUTANEOUS  01/10/2018   SUPRAPUBIC CATHETER PLACEMENT  08/2017    TONSILLECTOMY     Patient Active Problem List   Diagnosis Date Noted   Stroke-like symptoms 06/13/2023   Refractory nausea and vomiting 02/22/2023   Syncope and collapse 02/19/2023   Palliative care by specialist 02/19/2023   UTI (urinary tract infection) 02/13/2023   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   Neurogenic bladder 02/13/2023   Hepatic steatosis 02/13/2023   Wheelchair dependent 02/13/2023   Secondary adrenal insufficiency (HCC) 02/13/2023   Right flank pain 02/13/2023   Iron deficiency anemia 10/02/2022   Seizure (HCC) 11/11/2018   Major depressive disorder, recurrent episode, moderate (HCC) 02/07/2018   Nephrolithiasis 04/16/2016   Numbness 07/28/2015   Bladder retention 06/23/2015   Abdominal pain 06/04/2015   Dizziness 05/18/2015   Neck pain 05/18/2015   Complicated migraine 04/28/2015   Other fatigue 04/28/2015   Abnormal finding on MRI of brain 04/28/2015   Diarrhea 03/29/2015   H/O disease 03/29/2015   Abnormal weight loss 03/29/2015   Muscle weakness (generalized) 03/14/2015   Headache, migraine 03/10/2015    ONSET DATE: 06/13/23  REFERRING DIAG: CVA  THERAPY DIAG:  Muscle weakness (generalized)  Unsteadiness on feet  Difficulty in walking, not elsewhere classified  Other lack of coordination  Pain in left hip  Pain in right hip  Other muscle spasm  Rationale for Evaluation and Treatment: Rehabilitation  SUBJECTIVE:                                                                                                                                                                                             SUBJECTIVE STATEMENT:  Patient reports two weeks, she noticed L sided pain from ovary straight back tolower ribs ( pt pointed) It does not wrap around.  It was painful to the point like the R sided pain. Pt had medication which took the edge off after several hours. Since then, the pain has been intermittent on both sides, lasting for  several hours. Medication and heating pad decrease pain but not all the way.  Pt was in pain last night and she did not sleep well.  Pain to day is 5/10 at the L side    PERTINENT HISTORY:  Patient is referred to PT s/p CVA.  Patient presents to PT s/p CVA on 06/13/23.  Pmh includes adrenal insufficiency, Hereditary spastic paraplegia (HSP), small fiber neuropathy c/b BLE paraplegia and neurogenic bladder s/p cystectomy w/ ileal condiuit (05/2022), chronic pain, migraine. Patient was recently hospitalized prior to this at [facility] from 04/30/23 to 05/27/23. Patient reports L sided weakness. Is having stabbing pains for a few minutes.   Patient presents to pelvic PT and provides this information:  Pt was sent to ED March 31 at [facility] for R abdominal pain below her stoma. Low blood flow to her ovary . Pt saw her gynecologist yesterday and they are going to try to a tap block to address the pain which she is caused by multifactorial issues.  Pt had hysterectomy in 2024 and fallopian tubes and uterus , bladder and urethra were removed. Ovaries remained to prevent from early menopause.  Pt had kidney infections that led her be hospitalized for Jan 13- Feb 9  . During that time, pt learned she had endometrioma on the R ovary. Pt was placed on hormones for shrink the endometrioma. Pain has escalated with the hormone treatment and the endometrioma grew in size. Due to the   hormone Tx was not helping, they  will be replaced by Lupron once insurances approves. Pt is not wanting to take Lupron.              R abdominal pain below her stoma : Pain is intermittent. There is throbbing pain. The pain triggers nausea 4 x within the last 5 days.  Due to the nausea, pt has not had much of an appetite. Pt has daily BM but it is like IBS, sometimes loose and sometimes hard stools. Stool consistency 50% Type 6-7, 50%  Type 4-5    It has not escalated to the level that lead to the ED visit and the pain readiated to  the pelvic area "like labor pains but no baby" .  Baseline 6-7/10 pain lately and is localized at the flank and groin not pelvic area. Near her stoma bag. When she removes her stoma bag, there is no pain. The pain was triggered with leaning back too far and lifting legs for an exercise. There is not rhyme or reason when the pain occurs. It also occurs when sleeping . It does not matter if she is on her back or side. 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  Pt has tried vaginal Valium  and it helped some but she found it has sedative effect and only uses when she is in desperate situation.  Pt feels the pain is less related to pelvic floor.    Pt have abdominal scars where 2 surgeries were performed.    Gynecologist referred her for Pelvic PT. Pt would like to achieve to 3/10 pain and be able to attend children's activities. Pt would like to have less pain when undergoing vaginal US  exam because they were very painful.    Decrease pain triggering nausea from 4 x within the last 5 days to < 2 x with one week.  Decrease scar adhesion.   Stool consistency 50% Type 6-7, 50%  Type 4-5   PAIN:  Are you having pain? Has been having sharp stabbing pains, throbbing pain at the flank and R groin area   PRECAUTIONS: Fall  RED FLAGS: None and hx of bladder removal     WEIGHT BEARING RESTRICTIONS: No  FALLS: Has patient fallen in last 6 months? Yes. Number of falls 3  LIVING ENVIRONMENT: Lives with: lives with their family and lives with their spouse Lives in: House/apartment Stairs: ramp Has following equipment at home: Wheelchair (manual) and Ramped entry  PLOF: Independent with household mobility with device  PATIENT GOALS: to get stronger, do ADLs better.   OBJECTIVE:  Note: Objective measures were completed at Evaluation unless otherwise noted.  DIAGNOSTIC FINDINGS:  MRI results received from [facility] "1. Two small acute cortical infarcts  within the posterior right frontal lobe (with involvement of the motor strip). 2. Adjacent small acute infarct within the posterior right frontal lobe white matter. 3. Background moderate multifocal T2 FLAIR hyperintense signal abnormality within the cerebral white matter (with a subcortical white matter predominance), greater than expected for age. These signal changes are nonspecific and differential considerations include accelerated chronic small vessel ischemic disease, sequelae of chronic migraine headaches, sequelae of vasculitis/hypercoagulable state and sequelae of a prior infectious/inflammatory process, among others.   MRA head:   Significantly motion degraded examination. No proximal intracranial large vessel occlusion is identified. Motion artifact precludes accurate assessment for stenoses and for vessel irregularity.    COGNITION: Overall cognitive status: Within functional limits for tasks assessed   SENSATION: Light touch: Impaired ; below R knee has decreased sensation   COORDINATION: Heel shin: tonic shaking movements    MUSCLE TONE: LLE: Moderate RLE Moderate   POSTURE: forward head, increased thoracic kyphosis, posterior pelvic tilt, and flexed trunk     LOWER EXTREMITY MMT:    MMT Right Eval Left Eval  Hip flexion 6.2 5.6  Hip extension    Hip abduction 6.5 6.4  Hip adduction 3.4 3.4  Hip internal rotation    Hip external rotation  Knee flexion 5.1 1.0  Knee extension 7.1 3.2  Ankle dorsiflexion    Ankle plantarflexion    Ankle inversion    Ankle eversion    (Blank rows = not tested)  BED MOBILITY:  Will assess in future session.  TRANSFERS: Assistive device utilized: Environmental consultant - 4 wheeled  Sit to stand: CGA Stand to sit: CGA Chair to chair: Min A     GAIT: Gait pattern: step to pattern, knee flexed in stance- Right, knee flexed in stance- Left, ataxic, trunk flexed, poor foot clearance- Right, and poor foot clearance-  Left Distance walked: 8 ft  Assistive device utilized: Walker - 4 wheeled Level of assistance: CGA and Min A Comments: Jerking tonic  gait pattern   FUNCTIONAL TESTS:  5 times sit to stand: 46.33 with hands  10 meter walk test: 1.04 for 8 ft; unable to finish  PATIENT SURVEYS:  Stroke Impact Scale give next session 3                                                                                                                              TREATMENT DATE: 09/19/23    [facility] PT Assessment - 09/19/23 1349       Strength   Overall Strength Comments R hip abd 3/5      Palpation   SI assessment  supine: L SIS more anterior and higher    Palpation comment R SIJ hypomobile, tightness at coccygeus R,             [facility] Adult PT Treatment/Exercise - 09/19/23 1412       Neuro Re-ed    Neuro Re-ed Details  cued for happy baby stretch and restorative butterfly pose , supported by pillows , cued for clam shells      Modalities   Modalities Moist Heat      Moist Heat Therapy   Number Minutes Moist Heat 5 Minutes    Moist Heat Location --   butterfly supported by pilows under knees     Manual Therapy   Manual therapy comments superior glide at lateral border of SIJ R< rotational mob, STM/MWM at occygeus R                 PATIENT EDUCATION: Education details: goals, POC  Person educated: Patient Education method: Explanation, Demonstration, Tactile cues, and Verbal cues Education comprehension: verbalized understanding, returned demonstration, verbal cues required, and tactile cues required  HOME EXERCISE PROGRAM: See pt instructions  GOALS: Goals reviewed with patient? Yes  SHORT TERM GOALS: Target date: 07/23/2023    Patient will be independent in home exercise program to improve strength/mobility for better functional independence with ADLs. Baseline: 3/10: give next session Goal status: Ongoing    LONG TERM GOALS: Target date: 10/24/2023    Patient (< 61  years old) will complete five times sit to stand test in < 10 seconds indicating an increased LE strength and improved balance. Baseline: 3/10:  46.33 Goal status: Ongoing  2.  Patient will perform 10 MWT under 2 minutes with no rest breaks indicating improved functional ambulation. Baseline: 3/10: 8 ft in 1 min 4 seconds Goal status: Ongoing  3.  Patient will tolerate standing 3 minutes for ADL performance Baseline: 9/60: knee buckling in standing Goal status: Ongoing  4.  Patient will increase BLE gross strength to 4+/5 and/or within 1lb of each limb  as to improve functional strength for independent gait, increased standing tolerance and increased ADL ability. Baseline: 3/10: see above  Goal status: Ongoing               5. Pt will demo increased ab scar mobility to achieve more relaxation of low abdomen and pelvic floor in order to experience <3/10 pain and be able to attend children's activities and  when undergoing vaginal US  exam because they were very painful.  Baseline:Gynecologist referred her for Pelvic PT. Pt would like to achieve to 3/10 pain and be able to attend children's activities. Pt would like to have less pain when undergoing vaginal US  exam because they were very painful.  Goal status  ONgoing             6.  Decrease pain triggering nausea from 4 x within the last 5 days to < 2 x with one week.                        Baseline:  Decrease pain triggering nausea from 4 x within the last 5 days to < 2 x with one week.     Goal status: ongoing   7. Pt will demo improved stool consistency Type 4-5 to  >75 % in order to minimize ab pain, promote GI , bowel and pelvic floor functio           Baseline:  Stool consistency 50% Type 6-7, 50%  Type 4-5            Goal status: Ongoing   ASSESSMENT:  CLINICAL IMPRESSION:        Patient will benefit from skilled physical therapy to increase functional mobility, strength, and independence with ADLs.     OBJECTIVE  IMPAIRMENTS: Abnormal gait, decreased activity tolerance, decreased balance, decreased coordination, decreased endurance, decreased mobility, difficulty walking, decreased strength, dizziness, impaired perceived functional ability, increased muscle spasms, impaired flexibility, improper body mechanics, and postural dysfunction.   ACTIVITY LIMITATIONS: carrying, lifting, bending, sitting, standing, squatting, sleeping, stairs, transfers, bed mobility, bathing, toileting, dressing, reach over head, hygiene/grooming, locomotion level, and caring for others  PARTICIPATION LIMITATIONS: meal prep, cleaning, laundry, interpersonal relationship, driving, shopping, community activity, yard work, and church  PERSONAL FACTORS: Age, Past/current experiences, Time since onset of injury/illness/exacerbation, Transportation, and 3+ comorbidities: adrenal insufficiency, Hereditary spastic paraplegia (HSP), small fiber neuropathy c/b BLE paraplegia and neurogenic bladder s/p cystectomy w/ ileal condiuit (05/2022), chronic pain, migraine are also affecting patient's functional outcome.   REHAB POTENTIAL: Good  CLINICAL DECISION MAKING: Evolving/moderate complexity  EVALUATION COMPLEXITY: Moderate  PLAN:  PT FREQUENCY: 2x/week  PT DURATION: 12 weeks  PLANNED INTERVENTIONS: 97164- PT Re-evaluation, 97110-Therapeutic exercises, 97530- Therapeutic activity, 97112- Neuromuscular re-education, 97535- Self Care, 45409- Manual therapy, [id number]- Gait training, [phone]- Orthotic Fit/training, [phone]- Canalith repositioning, [id number]- Splinting, [id number]- Electrical stimulation (unattended), [id number]- Electrical stimulation (manual), [id number]- Vasopneumatic device, [id number]- Ultrasound, [id number]- Traction (mechanical), [id number]- Ionotophoresis 4mg /ml Dexamethasone , Patient/Family education, Balance training, Stair training, Taping, Dry Needling, Joint mobilization, Joint manipulation, Spinal manipulation, Spinal mobilization, Scar  mobilization,  Compression bandaging, Vestibular training, Visual/preceptual remediation/compensation, DME instructions, Wheelchair mobility training, Cryotherapy, Moist heat, and Biofeedback  PLAN FOR NEXT SESSION: Address R side of abdomen fascial restrictions    Modesto Andreas, PT 09/19/2023, 1:42 PM

## 2023-09-19 NOTE — Patient Instructions (Signed)
   Clam Shell 45 Degrees  Lying with hips and knees bent 45, one pillow between knees and ankles. Heel together, toes apart like ballerina,  Lift knee with exhale while pressing heels together. Be sure pelvis does not roll backward. Do not arch back. Do 20 times, each leg, 2 times per day.    Complimentary stretch:  Hip abductor and hip flexors stretch at the same time    Alternative if hip is tight,   Sit at 45 deg turn with R leg and knee on edge of chair/ bench, L buttock hanging off the edge to bring the L foot back like a lunge, toes bent, lower heel to feel quad stretch,  pay attention to keeping pinky and first toe ballmound planted to align toes forward so ankles are not twerked   Repeat with other side   ___   Happy baby  5 breaths    __  Restorative butterfly pose to open the hips  Knees / ankles supported by pillow  10 min

## 2023-09-24 ENCOUNTER — Ambulatory Visit: Payer: BC Managed Care – PPO

## 2023-09-24 ENCOUNTER — Ambulatory Visit: Admitting: Occupational Therapy

## 2023-09-26 ENCOUNTER — Encounter: Admitting: Occupational Therapy

## 2023-09-26 ENCOUNTER — Ambulatory Visit: Admitting: Occupational Therapy

## 2023-09-26 ENCOUNTER — Ambulatory Visit: Payer: BC Managed Care – PPO

## 2023-09-27 NOTE — Therapy (Incomplete)
 OUTPATIENT PHYSICAL THERAPY NEURO TREATMENT   Patient Name: Chevi Lim MRN: [id number] DOB:04-01-1981, 43 y.o., female Today's Date: 09/27/2023   PCP: Madaline Scales REFERRING PROVIDER: Devora Folks  END OF SESSION:       Past Medical History:  Diagnosis Date   Bladder retention    Complication of anesthesia    ? seizures after anesthesia    Depression    Diarrhea due to malabsorption    Dizziness    Headache    Migraines    Neurogenic bladder    Pyelonephritis    Renal disorder    Vision abnormalities    Weight loss    Past Surgical History:  Procedure Laterality Date   ANTERIOR CRUCIATE LIGAMENT REPAIR  1997   APPENDECTOMY     BLADDER REMOVAL     COLONOSCOPY WITH PROPOFOL  N/A 11/11/2018   Procedure: COLONOSCOPY WITH PROPOFOL ;  Surgeon: Deveron Fly, MD;  Location: [facility] ENDOSCOPY;  Service: Endoscopy;  Laterality: N/A;   CYSTOSCOPY WITH STENT PLACEMENT Right 04/17/2016   Procedure: CYSTOSCOPY WITH STENT PLACEMENT;  Surgeon: Marco Severs, MD;  Location: [facility] ORS;  Service: Urology;  Laterality: Right;   ESOPHAGOGASTRODUODENOSCOPY (EGD) WITH PROPOFOL  N/A 11/11/2018   Procedure: ESOPHAGOGASTRODUODENOSCOPY (EGD) WITH PROPOFOL ;  Surgeon: Deveron Fly, MD;  Location: [facility] ENDOSCOPY;  Service: Endoscopy;  Laterality: N/A;   EXPLORATORY LAPAROTOMY  1999   HIP SURGERY Right    KIDNEY STONE SURGERY  04/2016   REVISION UROSTOMY CUTANEOUS     REVISION UROSTOMY CUTANEOUS  01/10/2018   SUPRAPUBIC CATHETER PLACEMENT  08/2017   TONSILLECTOMY     Patient Active Problem List   Diagnosis Date Noted   Stroke-like symptoms 06/13/2023   Refractory nausea and vomiting 02/22/2023   Syncope and collapse 02/19/2023   Palliative care by specialist 02/19/2023   UTI (urinary tract infection) 02/13/2023   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   Neurogenic bladder 02/13/2023   Hepatic steatosis 02/13/2023   Wheelchair dependent 02/13/2023    Secondary adrenal insufficiency (HCC) 02/13/2023   Right flank pain 02/13/2023   Iron deficiency anemia 10/02/2022   Seizure (HCC) 11/11/2018   Major depressive disorder, recurrent episode, moderate (HCC) 02/07/2018   Nephrolithiasis 04/16/2016   Numbness 07/28/2015   Bladder retention 06/23/2015   Abdominal pain 06/04/2015   Dizziness 05/18/2015   Neck pain 05/18/2015   Complicated migraine 04/28/2015   Other fatigue 04/28/2015   Abnormal finding on MRI of brain 04/28/2015   Diarrhea 03/29/2015   H/O disease 03/29/2015   Abnormal weight loss 03/29/2015   Muscle weakness (generalized) 03/14/2015   Headache, migraine 03/10/2015    ONSET DATE: 06/13/23  REFERRING DIAG: CVA  THERAPY DIAG:  No diagnosis found.  Rationale for Evaluation and Treatment: Rehabilitation  SUBJECTIVE:  SUBJECTIVE STATEMENT:  ***  Pt was  accompanied by: significant other  ( not today)      PERTINENT HISTORY:  Patient is referred to PT s/p CVA.  Patient presents to PT s/p CVA on 06/13/23.  Pmh includes adrenal insufficiency, Hereditary spastic paraplegia (HSP), small fiber neuropathy c/b BLE paraplegia and neurogenic bladder s/p cystectomy w/ ileal condiuit (05/2022), chronic pain, migraine. Patient was recently hospitalized prior to this at [facility] from 04/30/23 to 05/27/23. Patient reports L sided weakness. Is having stabbing pains for a few minutes.   Patient presents to pelvic PT and provides this information:  Pt was sent to ED March 31 at [facility] for R abdominal pain below her stoma. Low blood flow to her ovary . Pt saw her gynecologist yesterday and they are going to try to a tap block to address the pain which she is caused by multifactorial issues.  Pt had hysterectomy in 2024 and fallopian tubes and  uterus , bladder and urethra were removed. Ovaries remained to prevent from early menopause.  Pt had kidney infections that led her be hospitalized for Jan 13- Feb 9  . During that time, pt learned she had endometrioma on the R ovary. Pt was placed on hormones for shrink the endometrioma. Pain has escalated with the hormone treatment and the endometrioma grew in size. Due to the   hormone Tx was not helping, they  will be replaced by Lupron once insurances approves. Pt is not wanting to take Lupron.              R abdominal pain below her stoma : Pain is intermittent. There is throbbing pain. The pain triggers nausea 4 x within the last 5 days.  Due to the nausea, pt has not had much of an appetite. Pt has daily BM but it is like IBS, sometimes loose and sometimes hard stools. Stool consistency 50% Type 6-7, 50%  Type 4-5    It has not escalated to the level that lead to the ED visit and the pain readiated to the pelvic area like labor pains but no baby .  Baseline 6-7/10 pain lately and is localized at the flank and groin not pelvic area. Near her stoma bag. When she removes her stoma bag, there is no pain. The pain was triggered with leaning back too far and lifting legs for an exercise. There is not rhyme or reason when the pain occurs. It also occurs when sleeping . It does not matter if she is on her back or side. 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  Pt has tried vaginal Valium  and it helped some but she found it has sedative effect and only uses when she is in desperate situation.  Pt feels the pain is less related to pelvic floor.    Pt have abdominal scars where 2 surgeries were performed.    Gynecologist referred her for Pelvic PT. Pt would like to achieve to 3/10 pain and be able to attend children's activities. Pt would like to have less pain when undergoing vaginal US  exam because they were very painful.    Decrease pain triggering nausea from 4 x  within the last 5 days to < 2 x with one week.  Decrease scar adhesion.   Stool consistency 50% Type 6-7, 50%  Type 4-5   PAIN:  Are you having pain? Has been having sharp stabbing pains, throbbing pain at the flank and  R groin area   PRECAUTIONS: Fall  RED FLAGS: None and hx of bladder removal     WEIGHT BEARING RESTRICTIONS: No  FALLS: Has patient fallen in last 6 months? Yes. Number of falls 3  LIVING ENVIRONMENT: Lives with: lives with their family and lives with their spouse Lives in: House/apartment Stairs: ramp Has following equipment at home: Wheelchair (manual) and Ramped entry  PLOF: Independent with household mobility with device  PATIENT GOALS: to get stronger, do ADLs better.   OBJECTIVE:  Note: Objective measures were completed at Evaluation unless otherwise noted.  DIAGNOSTIC FINDINGS:  MRI results received from [facility] 1. 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 2. Adjacent small acute infarct within the posterior right frontal lobe white matter. 3. Background moderate multifocal T2 FLAIR hyperintense signal abnormality within the cerebral white matter (with a subcortical white matter predominance), greater than expected for age. These signal changes are nonspecific and differential considerations include accelerated chronic small vessel ischemic disease, sequelae of chronic migraine headaches, sequelae of vasculitis/hypercoagulable state and sequelae of a prior infectious/inflammatory process, among others.   MRA head:   Significantly motion degraded examination. No proximal intracranial large vessel occlusion is identified. Motion artifact precludes accurate assessment for stenoses and for vessel irregularity.    COGNITION: Overall cognitive status: Within functional limits for tasks assessed   SENSATION: Light touch: Impaired ; below R knee has decreased  sensation   COORDINATION: Heel shin: tonic shaking movements    MUSCLE TONE: LLE: Moderate RLE Moderate   POSTURE: forward head, increased thoracic kyphosis, posterior pelvic tilt, and flexed trunk     LOWER EXTREMITY MMT:    MMT Right Eval Left Eval  Hip flexion 6.2 5.6  Hip extension    Hip abduction 6.5 6.4  Hip adduction 3.4 3.4  Hip internal rotation    Hip external rotation    Knee flexion 5.1 1.0  Knee extension 7.1 3.2  Ankle dorsiflexion    Ankle plantarflexion    Ankle inversion    Ankle eversion    (Blank rows = not tested)  BED MOBILITY:  Will assess in future session.  TRANSFERS: Assistive device utilized: Environmental consultant - 4 wheeled  Sit to stand: CGA Stand to sit: CGA Chair to chair: Min A     GAIT: Gait pattern: step to pattern, knee flexed in stance- Right, knee flexed in stance- Left, ataxic, trunk flexed, poor foot clearance- Right, and poor foot clearance- Left Distance walked: 8 ft  Assistive device utilized: Walker - 4 wheeled Level of assistance: CGA and Min A Comments: Jerking tonic  gait pattern   FUNCTIONAL TESTS:  5 times sit to stand: 46.33 with hands  10 meter walk test: 1.04 for 8 ft; unable to finish  PATIENT SURVEYS:  Stroke Impact Scale give next session 3  TREATMENT DATE: 09/27/23     TherAct: Static stand at // bar 60 seconds x 2 trials march 10x each LE Sit to stand 10x; 5x then seated rest then 5x  Lateral step 10x each LE Dual task with word game reaching and visual scan x 8 minutes  Stand pivot transfer to/from wheelchair to/from table   TherEx Supine: Hamstring stretch 60 seconds each LE Single knee to chest 60 seconds each LE Figure four position 60 seconds each LE Thoracic rotation twists 10x;30 second holds at final rotation  Posterior pelvic tilt 10x BTB abduction 15x; 2 sets BTB  march 15x; 2 sets Heel slides 10x each LE  PATIENT EDUCATION: Education details: goals, POC  Person educated: Patient Education method: Explanation, Demonstration, Tactile cues, and Verbal cues Education comprehension: verbalized understanding, returned demonstration, verbal cues required, and tactile cues required  HOME EXERCISE PROGRAM: See pt instructions  GOALS: Goals reviewed with patient? Yes  SHORT TERM GOALS: Target date: 07/23/2023    Patient will be independent in home exercise program to improve strength/mobility for better functional independence with ADLs. Baseline: 3/10: give next session Goal status: Ongoing    LONG TERM GOALS: Target date: 10/24/2023    Patient (< 47 years old) will complete five times sit to stand test in < 10 seconds indicating an increased LE strength and improved balance. Baseline: 3/10: 46.33 Goal status: Ongoing  2.  Patient will perform 10 MWT under 2 minutes with no rest breaks indicating improved functional ambulation. Baseline: 3/10: 8 ft in 1 min 4 seconds Goal status: Ongoing  3.  Patient will tolerate standing 3 minutes for ADL performance Baseline: 2/13: knee buckling in standing Goal status: Ongoing  4.  Patient will increase BLE gross strength to 4+/5 and/or within 1lb of each limb  as to improve functional strength for independent gait, increased standing tolerance and increased ADL ability. Baseline: 3/10: see above  Goal status: Ongoing               5. Pt will demo increased ab scar mobility to achieve more relaxation of low abdomen and pelvic floor in order to experience <3/10 pain and be able to attend children's activities and  when undergoing vaginal US  exam because they were very painful.  Baseline:Gynecologist referred her for Pelvic PT. Pt would like to achieve to 3/10 pain and be able to attend children's activities. Pt would like to have less pain when undergoing vaginal US  exam because they were very painful.   Goal status  ONgoing             6.  Decrease pain triggering nausea from 4 x within the last 5 days to < 2 x with one week.                        Baseline:  Decrease pain triggering nausea from 4 x within the last 5 days to < 2 x with one week.     Goal status: ongoing   7. Pt will demo improved stool consistency Type 4-5 to  >75 % in order to minimize ab pain, promote GI , bowel and pelvic floor functio           Baseline:  Stool consistency 50% Type 6-7, 50%  Type 4-5            Goal status: Ongoing   ASSESSMENT:  CLINICAL IMPRESSION:  ***     Patient will benefit from skilled physical therapy  to increase functional mobility, strength, and independence with ADLs.     OBJECTIVE IMPAIRMENTS: Abnormal gait, decreased activity tolerance, decreased balance, decreased coordination, decreased endurance, decreased mobility, difficulty walking, decreased strength, dizziness, impaired perceived functional ability, increased muscle spasms, impaired flexibility, improper body mechanics, and postural dysfunction.   ACTIVITY LIMITATIONS: carrying, lifting, bending, sitting, standing, squatting, sleeping, stairs, transfers, bed mobility, bathing, toileting, dressing, reach over head, hygiene/grooming, locomotion level, and caring for others  PARTICIPATION LIMITATIONS: meal prep, cleaning, laundry, interpersonal relationship, driving, shopping, community activity, yard work, and church  PERSONAL FACTORS: Age, Past/current experiences, Time since onset of injury/illness/exacerbation, Transportation, and 3+ comorbidities: adrenal insufficiency, Hereditary spastic paraplegia (HSP), small fiber neuropathy c/b BLE paraplegia and neurogenic bladder s/p cystectomy w/ ileal condiuit (05/2022), chronic pain, migraine are also affecting patient's functional outcome.   REHAB POTENTIAL: Good  CLINICAL DECISION MAKING: Evolving/moderate complexity  EVALUATION COMPLEXITY: Moderate  PLAN:  PT FREQUENCY:  2x/week  PT DURATION: 12 weeks  PLANNED INTERVENTIONS: 97164- PT Re-evaluation, 97110-Therapeutic exercises, 97530- Therapeutic activity, [id number]- Neuromuscular re-education, 97535- Self Care, [id number]- Manual therapy, [id number]- Gait training, [phone]- Orthotic Fit/training, [phone]- Canalith repositioning, [id number]- Splinting, [id number]- Electrical stimulation (unattended), [phone]- Electrical stimulation (manual), [id number]- Vasopneumatic device, [id number]- Ultrasound, [id number]- Traction (mechanical), [id number]- Ionotophoresis 4mg /ml Dexamethasone , Patient/Family education, Balance training, Stair training, Taping, Dry Needling, Joint mobilization, Joint manipulation, Spinal manipulation, Spinal mobilization, Scar mobilization, Compression bandaging, Vestibular training, Visual/preceptual remediation/compensation, DME instructions, Wheelchair mobility training, Cryotherapy, Moist heat, and Biofeedback  PLAN FOR NEXT SESSION: Address R side of abdomen fascial restrictions    Avery Klingbeil, PT 09/27/2023, 1:23 PM

## 2023-10-01 ENCOUNTER — Ambulatory Visit: Payer: BC Managed Care – PPO

## 2023-10-01 ENCOUNTER — Ambulatory Visit: Admitting: Occupational Therapy

## 2023-10-02 NOTE — Therapy (Signed)
 OUTPATIENT PHYSICAL THERAPY NEURO TREATMENT   Patient Name: Christine Chavez MRN: [id number] DOB:1981/03/09, 43 y.o., female Today's Date: 10/03/2023   PCP: Madaline Scales REFERRING PROVIDER: Devora Folks  END OF SESSION:  PT End of Session - 10/03/23 1102     Visit Number 12    Number of Visits 36    Date for PT Re-Evaluation 11/07/23   PN visit 10- 08/23/23   PT Start Time 1100    PT Stop Time 1140    PT Time Calculation (min) 40 min    Equipment Utilized During Treatment Gait belt    Activity Tolerance Patient tolerated treatment well;Patient limited by fatigue    Behavior During Therapy WFL for tasks assessed/performed              Past Medical History:  Diagnosis Date   Bladder retention    Complication of anesthesia    ? seizures after anesthesia    Depression    Diarrhea due to malabsorption    Dizziness    Headache    Migraines    Neurogenic bladder    Pyelonephritis    Renal disorder    Vision abnormalities    Weight loss    Past Surgical History:  Procedure Laterality Date   ANTERIOR CRUCIATE LIGAMENT REPAIR  1997   APPENDECTOMY     BLADDER REMOVAL     COLONOSCOPY WITH PROPOFOL  N/A 11/11/2018   Procedure: COLONOSCOPY WITH PROPOFOL ;  Surgeon: Deveron Fly, MD;  Location: [facility] ENDOSCOPY;  Service: Endoscopy;  Laterality: N/A;   CYSTOSCOPY WITH STENT PLACEMENT Right 04/17/2016   Procedure: CYSTOSCOPY WITH STENT PLACEMENT;  Surgeon: Marco Severs, MD;  Location: [facility] ORS;  Service: Urology;  Laterality: Right;   ESOPHAGOGASTRODUODENOSCOPY (EGD) WITH PROPOFOL  N/A 11/11/2018   Procedure: ESOPHAGOGASTRODUODENOSCOPY (EGD) WITH PROPOFOL ;  Surgeon: Deveron Fly, MD;  Location: [facility] ENDOSCOPY;  Service: Endoscopy;  Laterality: N/A;   EXPLORATORY LAPAROTOMY  1999   HIP SURGERY Right    KIDNEY STONE SURGERY  04/2016   REVISION UROSTOMY CUTANEOUS     REVISION UROSTOMY CUTANEOUS  01/10/2018   SUPRAPUBIC CATHETER PLACEMENT  08/2017    TONSILLECTOMY     Patient Active Problem List   Diagnosis Date Noted   Stroke-like symptoms 06/13/2023   Refractory nausea and vomiting 02/22/2023   Syncope and collapse 02/19/2023   Palliative care by specialist 02/19/2023   UTI (urinary tract infection) 02/13/2023   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   Neurogenic bladder 02/13/2023   Hepatic steatosis 02/13/2023   Wheelchair dependent 02/13/2023   Secondary adrenal insufficiency (HCC) 02/13/2023   Right flank pain 02/13/2023   Iron deficiency anemia 10/02/2022   Seizure (HCC) 11/11/2018   Major depressive disorder, recurrent episode, moderate (HCC) 02/07/2018   Nephrolithiasis 04/16/2016   Numbness 07/28/2015   Bladder retention 06/23/2015   Abdominal pain 06/04/2015   Dizziness 05/18/2015   Neck pain 05/18/2015   Complicated migraine 04/28/2015   Other fatigue 04/28/2015   Abnormal finding on MRI of brain 04/28/2015   Diarrhea 03/29/2015   H/O disease 03/29/2015   Abnormal weight loss 03/29/2015   Muscle weakness (generalized) 03/14/2015   Headache, migraine 03/10/2015    ONSET DATE: 06/13/23  REFERRING DIAG: CVA  THERAPY DIAG:  Muscle weakness (generalized)  Unsteadiness on feet  Difficulty in walking, not elsewhere classified  Other lack of coordination  Rationale for Evaluation and Treatment: Rehabilitation  SUBJECTIVE:  SUBJECTIVE STATEMENT:  Patient reports she had some bleeding while on vacation. Is waiting order for a second opinion referral for [facility].   Pt was  accompanied by: significant other  ( not today)      PERTINENT HISTORY:  Patient is referred to PT s/p CVA.  Patient presents to PT s/p CVA on 06/13/23.  Pmh includes adrenal insufficiency, Hereditary spastic paraplegia (HSP), small fiber  neuropathy c/b BLE paraplegia and neurogenic bladder s/p cystectomy w/ ileal condiuit (05/2022), chronic pain, migraine. Patient was recently hospitalized prior to this at [facility] from 04/30/23 to 05/27/23. Patient reports L sided weakness. Is having stabbing pains for a few minutes.   Patient presents to pelvic PT and provides this information:  Pt was sent to ED March 31 at [facility] for R abdominal pain below her stoma. Low blood flow to her ovary . Pt saw her gynecologist yesterday and they are going to try to a tap block to address the pain which she is caused by multifactorial issues.  Pt had hysterectomy in 2024 and fallopian tubes and uterus , bladder and urethra were removed. Ovaries remained to prevent from early menopause.  Pt had kidney infections that led her be hospitalized for Jan 13- Feb 9  . During that time, pt learned she had endometrioma on the R ovary. Pt was placed on hormones for shrink the endometrioma. Pain has escalated with the hormone treatment and the endometrioma grew in size. Due to the   hormone Tx was not helping, they  will be replaced by Lupron once insurances approves. Pt is not wanting to take Lupron.              R abdominal pain below her stoma : Pain is intermittent. There is throbbing pain. The pain triggers nausea 4 x within the last 5 days.  Due to the nausea, pt has not had much of an appetite. Pt has daily BM but it is like IBS, sometimes loose and sometimes hard stools. Stool consistency 50% Type 6-7, 50%  Type 4-5    It has not escalated to the level that lead to the ED visit and the pain readiated to the pelvic area like labor pains but no baby .  Baseline 6-7/10 pain lately and is localized at the flank and groin not pelvic area. Near her stoma bag. When she removes her stoma bag, there is no pain. The pain was triggered with leaning back too far and lifting legs for an exercise. There is not rhyme or reason when the pain occurs. It also occurs when  sleeping . It does not matter if she is on her back or side. 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  Pt has tried vaginal Valium  and it helped some but she found it has sedative effect and only uses when she is in desperate situation.  Pt feels the pain is less related to pelvic floor.    Pt have abdominal scars where 2 surgeries were performed.    Gynecologist referred her for Pelvic PT. Pt would like to achieve to 3/10 pain and be able to attend children's activities. Pt would like to have less pain when undergoing vaginal US  exam because they were very painful.    Decrease pain triggering nausea from 4 x within the last 5 days to < 2 x with one week.  Decrease scar adhesion.   Stool consistency 50% Type 6-7, 50%  Type 4-5  PAIN:  Are you having pain? Has been having sharp stabbing pains, throbbing pain at the flank and R groin area   PRECAUTIONS: Fall  RED FLAGS: None and hx of bladder removal     WEIGHT BEARING RESTRICTIONS: No  FALLS: Has patient fallen in last 6 months? Yes. Number of falls 3  LIVING ENVIRONMENT: Lives with: lives with their family and lives with their spouse Lives in: House/apartment Stairs: ramp Has following equipment at home: Wheelchair (manual) and Ramped entry  PLOF: Independent with household mobility with device  PATIENT GOALS: to get stronger, do ADLs better.   OBJECTIVE:  Note: Objective measures were completed at Evaluation unless otherwise noted.  DIAGNOSTIC FINDINGS:  MRI results received from [facility] 1. 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 2. Adjacent small acute infarct within the posterior right frontal lobe white matter. 3. Background moderate multifocal T2 FLAIR hyperintense signal abnormality within the cerebral white matter (with a subcortical white matter predominance), greater than expected for age.  These signal changes are nonspecific and differential considerations include accelerated chronic small vessel ischemic disease, sequelae of chronic migraine headaches, sequelae of vasculitis/hypercoagulable state and sequelae of a prior infectious/inflammatory process, among others.   MRA head:   Significantly motion degraded examination. No proximal intracranial large vessel occlusion is identified. Motion artifact precludes accurate assessment for stenoses and for vessel irregularity.    COGNITION: Overall cognitive status: Within functional limits for tasks assessed   SENSATION: Light touch: Impaired ; below R knee has decreased sensation   COORDINATION: Heel shin: tonic shaking movements    MUSCLE TONE: LLE: Moderate RLE Moderate   POSTURE: forward head, increased thoracic kyphosis, posterior pelvic tilt, and flexed trunk     LOWER EXTREMITY MMT:    MMT Right Eval Left Eval  Hip flexion 6.2 5.6  Hip extension    Hip abduction 6.5 6.4  Hip adduction 3.4 3.4  Hip internal rotation    Hip external rotation    Knee flexion 5.1 1.0  Knee extension 7.1 3.2  Ankle dorsiflexion    Ankle plantarflexion    Ankle inversion    Ankle eversion    (Blank rows = not tested)  BED MOBILITY:  Will assess in future session.  TRANSFERS: Assistive device utilized: Environmental consultant - 4 wheeled  Sit to stand: CGA Stand to sit: CGA Chair to chair: Min A     GAIT: Gait pattern: step to pattern, knee flexed in stance- Right, knee flexed in stance- Left, ataxic, trunk flexed, poor foot clearance- Right, and poor foot clearance- Left Distance walked: 8 ft  Assistive device utilized: Walker - 4 wheeled Level of assistance: CGA and Min A Comments: Jerking tonic  gait pattern   FUNCTIONAL TESTS:  5 times sit to stand: 46.33 with hands  10 meter walk test: 1.04 for 8 ft; unable to finish  PATIENT SURVEYS:  Stroke Impact Scale give next session 3  TREATMENT DATE: 10/03/23     TherAct: Static stand at // bar 60 seconds x 2 trials march 10x each LE Sit to stand 10x; 5x then seated rest then 5x  Lateral step 8x length of // bar Leg extension/step back 10x each LE Heel raises 10x with UE support on bar  Dual task with word game reaching and visual scan x 8 minutes  Stand pivot transfer to/from wheelchair to/from table   TherEx Supine: Hamstring stretch 60 seconds each LE Single knee to chest 60 seconds each LE Figure four position 60 seconds each LE Thoracic rotation twists 10x;30 second holds at final rotation  Posterior pelvic tilt 10x   PATIENT EDUCATION: Education details: goals, POC  Person educated: Patient Education method: Explanation, Demonstration, Tactile cues, and Verbal cues Education comprehension: verbalized understanding, returned demonstration, verbal cues required, and tactile cues required  HOME EXERCISE PROGRAM: See pt instructions  GOALS: Goals reviewed with patient? Yes  SHORT TERM GOALS: Target date: 07/23/2023    Patient will be independent in home exercise program to improve strength/mobility for better functional independence with ADLs. Baseline: 3/10: give next session Goal status: Ongoing    LONG TERM GOALS: Target date: 10/24/2023    Patient (< 38 years old) will complete five times sit to stand test in < 10 seconds indicating an increased LE strength and improved balance. Baseline: 3/10: 46.33 Goal status: Ongoing  2.  Patient will perform 10 MWT under 2 minutes with no rest breaks indicating improved functional ambulation. Baseline: 3/10: 8 ft in 1 min 4 seconds Goal status: Ongoing  3.  Patient will tolerate standing 3 minutes for ADL performance Baseline: 7/61: knee buckling in standing Goal status: Ongoing  4.  Patient will increase BLE gross strength to 4+/5 and/or within 1lb of each  limb  as to improve functional strength for independent gait, increased standing tolerance and increased ADL ability. Baseline: 3/10: see above  Goal status: Ongoing               5. Pt will demo increased ab scar mobility to achieve more relaxation of low abdomen and pelvic floor in order to experience <3/10 pain and be able to attend children's activities and  when undergoing vaginal US  exam because they were very painful.  Baseline:Gynecologist referred her for Pelvic PT. Pt would like to achieve to 3/10 pain and be able to attend children's activities. Pt would like to have less pain when undergoing vaginal US  exam because they were very painful.  Goal status  ONgoing             6.  Decrease pain triggering nausea from 4 x within the last 5 days to < 2 x with one week.                        Baseline:  Decrease pain triggering nausea from 4 x within the last 5 days to < 2 x with one week.     Goal status: ongoing   7. Pt will demo improved stool consistency Type 4-5 to  >75 % in order to minimize ab pain, promote GI , bowel and pelvic floor functio           Baseline:  Stool consistency 50% Type 6-7, 50%  Type 4-5            Goal status: Ongoing   ASSESSMENT:  CLINICAL IMPRESSION:  Patient is highly motivated throughout session. She has extensive ataxia  and trembling with fatigue in standing. Despite her pain she remains eager to progress her functional mobility and strength. Marching is very challenging with decreased stance phase accepted with fatigue.  Patient will benefit from skilled physical therapy to increase functional mobility, strength, and independence with ADLs.     OBJECTIVE IMPAIRMENTS: Abnormal gait, decreased activity tolerance, decreased balance, decreased coordination, decreased endurance, decreased mobility, difficulty walking, decreased strength, dizziness, impaired perceived functional ability, increased muscle spasms, impaired flexibility, improper body mechanics,  and postural dysfunction.   ACTIVITY LIMITATIONS: carrying, lifting, bending, sitting, standing, squatting, sleeping, stairs, transfers, bed mobility, bathing, toileting, dressing, reach over head, hygiene/grooming, locomotion level, and caring for others  PARTICIPATION LIMITATIONS: meal prep, cleaning, laundry, interpersonal relationship, driving, shopping, community activity, yard work, and church  PERSONAL FACTORS: Age, Past/current experiences, Time since onset of injury/illness/exacerbation, Transportation, and 3+ comorbidities: adrenal insufficiency, Hereditary spastic paraplegia (HSP), small fiber neuropathy c/b BLE paraplegia and neurogenic bladder s/p cystectomy w/ ileal condiuit (05/2022), chronic pain, migraine are also affecting patient's functional outcome.   REHAB POTENTIAL: Good  CLINICAL DECISION MAKING: Evolving/moderate complexity  EVALUATION COMPLEXITY: Moderate  PLAN:  PT FREQUENCY: 2x/week  PT DURATION: 12 weeks  PLANNED INTERVENTIONS: 97164- PT Re-evaluation, 97110-Therapeutic exercises, 97530- Therapeutic activity, [id number]- Neuromuscular re-education, 97535- Self Care, 16109- Manual therapy, [id number]- Gait training, [phone]- Orthotic Fit/training, [phone]- Canalith repositioning, [id number]- Splinting, [id number]- Electrical stimulation (unattended), [phone]- Electrical stimulation (manual), [id number]- Vasopneumatic device, [id number]- Ultrasound, [id number]- Traction (mechanical), [id number]- Ionotophoresis 4mg /ml Dexamethasone , Patient/Family education, Balance training, Stair training, Taping, Dry Needling, Joint mobilization, Joint manipulation, Spinal manipulation, Spinal mobilization, Scar mobilization, Compression bandaging, Vestibular training, Visual/preceptual remediation/compensation, DME instructions, Wheelchair mobility training, Cryotherapy, Moist heat, and Biofeedback  PLAN FOR NEXT SESSION: Address R side of abdomen fascial restrictions    Lamoine Fredricksen, PT 10/03/2023, 2:14 PM

## 2023-10-03 ENCOUNTER — Ambulatory Visit: Payer: BC Managed Care – PPO

## 2023-10-03 ENCOUNTER — Ambulatory Visit: Admitting: Occupational Therapy

## 2023-10-03 DIAGNOSIS — R2681 Unsteadiness on feet: Secondary | ICD-10-CM

## 2023-10-03 DIAGNOSIS — M6281 Muscle weakness (generalized): Secondary | ICD-10-CM

## 2023-10-03 DIAGNOSIS — R262 Difficulty in walking, not elsewhere classified: Secondary | ICD-10-CM

## 2023-10-03 DIAGNOSIS — R278 Other lack of coordination: Secondary | ICD-10-CM

## 2023-10-03 NOTE — Therapy (Cosign Needed)
 Occupational Therapy Progress/Recertification Note  Dates of reporting period  06/27/2023   to   10/03/2023  Patient Name: Christine Chavez MRN: [id number] DOB:06-17-1980, 43 y.o., female Today's Date: 10/05/2023  PCP: Madaline Scales, PA-C REFERRING PROVIDER: Rosan Comfort, MD   OT End of Session - 10/05/23 2146     Visit Number 10    Number of Visits 24    Date for OT Re-Evaluation 12/26/23    OT Start Time 1145    OT Stop Time 1230    OT Time Calculation (min) 45 min    Activity Tolerance Patient tolerated treatment well    Behavior During Therapy WFL for tasks assessed/performed           Past Medical History:  Diagnosis Date   Bladder retention    Complication of anesthesia    ? seizures after anesthesia    Depression    Diarrhea due to malabsorption    Dizziness    Headache    Migraines    Neurogenic bladder    Pyelonephritis    Renal disorder    Vision abnormalities    Weight loss    Past Surgical History:  Procedure Laterality Date   ANTERIOR CRUCIATE LIGAMENT REPAIR  1997   APPENDECTOMY     BLADDER REMOVAL     COLONOSCOPY WITH PROPOFOL  N/A 11/11/2018   Procedure: COLONOSCOPY WITH PROPOFOL ;  Surgeon: Deveron Fly, MD;  Location: [facility] ENDOSCOPY;  Service: Endoscopy;  Laterality: N/A;   CYSTOSCOPY WITH STENT PLACEMENT Right 04/17/2016   Procedure: CYSTOSCOPY WITH STENT PLACEMENT;  Surgeon: Marco Severs, MD;  Location: [facility] ORS;  Service: Urology;  Laterality: Right;   ESOPHAGOGASTRODUODENOSCOPY (EGD) WITH PROPOFOL  N/A 11/11/2018   Procedure: ESOPHAGOGASTRODUODENOSCOPY (EGD) WITH PROPOFOL ;  Surgeon: Deveron Fly, MD;  Location: [facility] ENDOSCOPY;  Service: Endoscopy;  Laterality: N/A;   EXPLORATORY LAPAROTOMY  1999   HIP SURGERY Right    KIDNEY STONE SURGERY  04/2016   REVISION UROSTOMY CUTANEOUS     REVISION UROSTOMY CUTANEOUS  01/10/2018   SUPRAPUBIC CATHETER PLACEMENT  08/2017   TONSILLECTOMY     Patient Active Problem List    Diagnosis Date Noted   Stroke-like symptoms 06/13/2023   Refractory nausea and vomiting 02/22/2023   Syncope and collapse 02/19/2023   Palliative care by specialist 02/19/2023   UTI (urinary tract infection) 02/13/2023   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   Neurogenic bladder 02/13/2023   Hepatic steatosis 02/13/2023   Wheelchair dependent 02/13/2023   Secondary adrenal insufficiency (HCC) 02/13/2023   Right flank pain 02/13/2023   Iron deficiency anemia 10/02/2022   Seizure (HCC) 11/11/2018   Major depressive disorder, recurrent episode, moderate (HCC) 02/07/2018   Nephrolithiasis 04/16/2016   Numbness 07/28/2015   Bladder retention 06/23/2015   Abdominal pain 06/04/2015   Dizziness 05/18/2015   Neck pain 05/18/2015   Complicated migraine 04/28/2015   Other fatigue 04/28/2015   Abnormal finding on MRI of brain 04/28/2015   Diarrhea 03/29/2015   H/O disease 03/29/2015   Abnormal weight loss 03/29/2015   Muscle weakness (generalized) 03/14/2015   Headache, migraine 03/10/2015    ONSET DATE: 06/13/23  REFERRING DIAG: CVA, HSP  THERAPY DIAG:  Muscle weakness (generalized)  Rationale for Evaluation and Treatment: Rehabilitation  SUBJECTIVE:   SUBJECTIVE STATEMENT:  Pt. reports she is planning to go to the beach for her anniversary next week.   Pt accompanied by: significant other Husband: Paul  PERTINENT HISTORY:   Pt. is a 43 y.o. female  who was hospitalized with a CVA from 06/13/23-06/14/23.  Imaging revealed two small acute cortical infarcts within the posterior Right frontal lobe (with involvement in the motor strip). PMHx includes: HSP With a  recent hospitalization from January 13-February 19th at [facility] 2/2 a Neuro Flare from the HSP. Hx of small fiber neuropathy d/t BLE paraplegia, Neurogenic bladder s/p cystectomy with ideal conduit (2/24) ,chronic pain, migraine. Pt. Reports multiple hospitalizations of the past year.  PRECAUTIONS:  None  WEIGHT BEARING RESTRICTIONS: No  PAIN:  Are you having pain? No   FALLS: Has patient fallen in last 6 months? Yes. Number of falls 3  LIVING ENVIRONMENT: Lives with: lives with their family Lives in: House/apartment, One level Stairs:  ramp. Has following equipment at home: Otho Blitz - 4 wheeled, Wheelchair (power), Wheelchair (manual), Shower bench, and bed side commode, bed rail  PLOF: Independent  PATIENT GOALS:  Regain strength, and hand coordination  OBJECTIVE:  Note: Objective measures were completed at Evaluation unless otherwise noted.  HAND DOMINANCE: Right  ADLs: Overall ADLs:  Transfers/ambulation related to ADLs: Eating:  Independent, Cutting. Difficulty opening  Grooming: Independent UB Dressing: Independent LB Dressing: Independent, set-up Toileting:  Independent, Ostomy Bag Bathing: Independent once in the shower            Shower transfers: Husband assists with shower transfers.   IADLs: Shopping: Orders online. Husband picks them up Light housekeeping: Family assists Meal Prep: Engineer, structural, Assist with meals, cutting Community mobility: Relies on family, and friends Medication management: Independent Financial management: No change Handwriting: 100% legible, Pain/fatigues with writing note cards. Hobbies: Making gifts baskets; shopping ([employer] is a favorite)  MOBILITY STATUS: Uses a w/c  POSTURE COMMENTS:   Sitting balance:  WFL  ACTIVITY TOLERANCE: Activity tolerance: limited  FUNCTIONAL OUTCOME MEASURES: MAM-20: Sum score: 73/80; MAM Measure score: 71.8  UPPER EXTREMITY ROM:    Active ROM Right Eval WFL Left Eval [facility]  Shoulder flexion    Shoulder abduction    Shoulder adduction    Shoulder extension    Shoulder internal rotation    Shoulder external rotation    Elbow flexion    Elbow extension    Wrist flexion    Wrist extension    Wrist ulnar deviation    Wrist radial deviation    Wrist pronation    Wrist supination     (Blank rows = not tested)  UPPER EXTREMITY MMT:     MMT Right eval Right 10/03/23 Left eval Left 10/03/23  Shoulder flexion 3+/5 3+/5 3+/5 3+/5  Shoulder abduction 3+/5 3+/5 3+/5 3+/5  Shoulder adduction      Shoulder extension      Shoulder internal rotation      Shoulder external rotation      Middle trapezius      Lower trapezius      Elbow flexion 3+/5 3+/5 3+/5 3+/5  Elbow extension 3+/5 3+/5 3+/5 3+/5  Wrist flexion      Wrist extension 3+/5 3+/5 3+/5 3+/5  Wrist ulnar deviation      Wrist radial deviation      Wrist pronation 3+/5 3+/5 3+/5 3+/5  Wrist supination 3+/5 3+/5 3+/5 3+/5  (Blank rows = not tested)  HAND FUNCTION: Eval: Grip strength: Right: 5 lbs; Left: 2 lbs and Lateral pinch: Right: 5 lbs, Left: 2 lbs, 3pt. Pinch: R: 5 lbs, L: 2 lbs; 2pt. Pinch: R: 1 lbs, L: 1lb  10/03/23: Grip strength: Right: 15 lbs; Left: 3 lbs and Lateral pinch:  Right: 4 lbs, Left: 2 lbs, 3pt. Pinch: R: 3 lbs, L: 2 lbs  COORDINATION:  COORDINATION:   Eval: 9 Hole Peg test: Right: 25 sec; Left: 31 sec  10/03/23:  9 Hole Peg test: Right: 34 sec; Left: 41 sec  SENSATION: WFL light touch, and proprioceptive awareness  EDEMA: N/A   COGNITION: Overall cognitive status: Within functional limits for tasks assessed  VISION: Subjective report: Reports blurriness  PERCEPTION: WFL  PRAXIS: WFL                                                                                                                           TREATMENT DATE: 10/03/2023    Measurements were obtained, and goals were reviewed with the Pt.  PATIENT EDUCATION: Education details: UE functioning, strengthening Person educated: Patient and Spouse Education method: Museum/gallery conservator cues,verbal cues Education comprehension: verbalized understanding and needs further education  HOME EXERCISE PROGRAM:  Continue ongoing assessment, and provide as indicated   GOALS: Goals reviewed with patient?  Yes  SHORT TERM GOALS: Target date: 11/14/2023   Pt. Will be independent with HEPs for BUEs. Baseline: 10/03/23: Independent with HEPs; continue to provide, and upgrade as indicated Eval:No current HEP Goal status: Ongoing   LONG TERM GOALS: Target date: 12/26/2023  Pt. Will be improve BUE strength by 2 mm grades to be able to independently reach for ADL/IADL items in multiple planes. Baseline: 10/03/23: BUE strength: 3+/5 overall. Eval: BUE strength: 3+/5 overall  Goal status: Ongoing  2.  Pt. will improve bilateral grip strength to be able to securely hold items. Baseline: 10/03/23: R: 15# L: 3# Eval: R: 5#, L: 2# Goal status: Ongoing  3.  Pt. Will improve bilateral lateral pinch strength by 3# to be able to open medication bottles Baseline: 10/03/23: R: 4# L: 2# L: Eval: R: 5#, L: 2# Goal status: Ongoing  4.  Pt. Will improve bilateral 3pt. pinch strength to be able to independently use a nail clipper Baseline: 10/03/23: R: 3# L: 2# Eval: R: 3#, L: 2# Goal status: Ongoing  5.  Pt. Will improve lef [facility] skills to be able to manipulate small objects. Baseline: 10/03/23: R: 34 sec., L: 41 sec. Eval: R: 25 sec., L: 31 sec. Goal status: Ongoing  ASSESSMENT:  CLINICAL IMPRESSION:  Pt. reports having increased episodes and intensity of pelvic pain. Pt. reports that she has been having appointments to determine next steps including surgery to help with the pain. Pt. has had several missed visits over this progress reporting period due to the pain, appointments and vacations. As a result, Pt. progress with bilateral UE strength, and bilateral hand coordination skills have been limited over this past certification, and progress reporting period. However, Pt. has made progress with bilateral grip strength. Pt. continues to require work on improving BUE strength, and Bilateral [facility] skills to be able to manipulate small options. Pt. continues to be very motivated, and has excellent family support. Pt.  continues to  benefit from OT services to work on improving BUE functioning in order to improve engagement in, and participation in ADL, and IADL functioning.   PERFORMANCE DEFICITS: in functional skills including ADLs, IADLs, coordination, dexterity, proprioception, ROM, strength, Fine motor control, Gross motor control, and UE functional use, cognitive skills including , and psychosocial skills including environmental adaptation, routines, and behaviors.   IMPAIRMENTS: are limiting patient from ADLs, IADLs, and leisure.   CO-MORBIDITIES: may have co-morbidities  that affects occupational performance. Patient will benefit from skilled OT to address above impairments and improve overall function.  MODIFICATION OR ASSISTANCE TO COMPLETE EVALUATION: Min-Moderate modification of tasks or assist with assess necessary to complete an evaluation.  OT OCCUPATIONAL PROFILE AND HISTORY: Detailed assessment: Review of records and additional review of physical, cognitive, psychosocial history related to current functional performance.  CLINICAL DECISION MAKING: Moderate - several treatment options, min-mod task modification necessary  REHAB POTENTIAL: Good  EVALUATION COMPLEXITY: Moderate    PLAN:  OT FREQUENCY: 2x/week  OT DURATION: 12 weeks  PLANNED INTERVENTIONS: 97168 OT Re-evaluation, 97535 self care/ADL training, 16109 therapeutic exercise, 97530 therapeutic activity, 97112 neuromuscular re-education, 97018 paraffin, 60454 contrast bath, 97760 Orthotics management and training, 09811 Splinting (initial encounter), functional mobility training, patient/family education, and DME and/or AE instructions  RECOMMENDED OTHER SERVICES: PT  CONSULTED AND AGREED WITH PLAN OF CARE: Patient and family member/caregiver  PLAN FOR NEXT SESSION: Treatment  Duey Ghent, MS, OTR/L  10/05/2023, 9:54 PM

## 2023-10-04 NOTE — Therapy (Signed)
 OUTPATIENT PHYSICAL THERAPY NEURO TREATMENT   Patient Name: Christine Chavez MRN: [id number] DOB:24-May-1980, 43 y.o., female Today's Date: 10/04/2023   PCP: Madaline Scales REFERRING PROVIDER: Devora Folks  END OF SESSION:        Past Medical History:  Diagnosis Date   Bladder retention    Complication of anesthesia    ? seizures after anesthesia    Depression    Diarrhea due to malabsorption    Dizziness    Headache    Migraines    Neurogenic bladder    Pyelonephritis    Renal disorder    Vision abnormalities    Weight loss    Past Surgical History:  Procedure Laterality Date   ANTERIOR CRUCIATE LIGAMENT REPAIR  1997   APPENDECTOMY     BLADDER REMOVAL     COLONOSCOPY WITH PROPOFOL  N/A 11/11/2018   Procedure: COLONOSCOPY WITH PROPOFOL ;  Surgeon: Deveron Fly, MD;  Location: [facility] ENDOSCOPY;  Service: Endoscopy;  Laterality: N/A;   CYSTOSCOPY WITH STENT PLACEMENT Right 04/17/2016   Procedure: CYSTOSCOPY WITH STENT PLACEMENT;  Surgeon: Marco Severs, MD;  Location: [facility] ORS;  Service: Urology;  Laterality: Right;   ESOPHAGOGASTRODUODENOSCOPY (EGD) WITH PROPOFOL  N/A 11/11/2018   Procedure: ESOPHAGOGASTRODUODENOSCOPY (EGD) WITH PROPOFOL ;  Surgeon: Deveron Fly, MD;  Location: [facility] ENDOSCOPY;  Service: Endoscopy;  Laterality: N/A;   EXPLORATORY LAPAROTOMY  1999   HIP SURGERY Right    KIDNEY STONE SURGERY  04/2016   REVISION UROSTOMY CUTANEOUS     REVISION UROSTOMY CUTANEOUS  01/10/2018   SUPRAPUBIC CATHETER PLACEMENT  08/2017   TONSILLECTOMY     Patient Active Problem List   Diagnosis Date Noted   Stroke-like symptoms 06/13/2023   Refractory nausea and vomiting 02/22/2023   Syncope and collapse 02/19/2023   Palliative care by specialist 02/19/2023   UTI (urinary tract infection) 02/13/2023   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   Neurogenic bladder 02/13/2023   Hepatic steatosis 02/13/2023   Wheelchair dependent 02/13/2023    Secondary adrenal insufficiency (HCC) 02/13/2023   Right flank pain 02/13/2023   Iron deficiency anemia 10/02/2022   Seizure (HCC) 11/11/2018   Major depressive disorder, recurrent episode, moderate (HCC) 02/07/2018   Nephrolithiasis 04/16/2016   Numbness 07/28/2015   Bladder retention 06/23/2015   Abdominal pain 06/04/2015   Dizziness 05/18/2015   Neck pain 05/18/2015   Complicated migraine 04/28/2015   Other fatigue 04/28/2015   Abnormal finding on MRI of brain 04/28/2015   Diarrhea 03/29/2015   H/O disease 03/29/2015   Abnormal weight loss 03/29/2015   Muscle weakness (generalized) 03/14/2015   Headache, migraine 03/10/2015    ONSET DATE: 06/13/23  REFERRING DIAG: CVA  THERAPY DIAG:  No diagnosis found.  Rationale for Evaluation and Treatment: Rehabilitation  SUBJECTIVE:  SUBJECTIVE STATEMENT:  ***  Pt was  accompanied by: significant other  ( not today)      PERTINENT HISTORY:  Patient is referred to PT s/p CVA.  Patient presents to PT s/p CVA on 06/13/23.  Pmh includes adrenal insufficiency, Hereditary spastic paraplegia (HSP), small fiber neuropathy c/b BLE paraplegia and neurogenic bladder s/p cystectomy w/ ileal condiuit (05/2022), chronic pain, migraine. Patient was recently hospitalized prior to this at [facility] from 04/30/23 to 05/27/23. Patient reports L sided weakness. Is having stabbing pains for a few minutes.   Patient presents to pelvic PT and provides this information:  Pt was sent to ED March 31 at [facility] for R abdominal pain below her stoma. Low blood flow to her ovary . Pt saw her gynecologist yesterday and they are going to try to a tap block to address the pain which she is caused by multifactorial issues.  Pt had hysterectomy in 2024 and fallopian tubes and  uterus , bladder and urethra were removed. Ovaries remained to prevent from early menopause.  Pt had kidney infections that led her be hospitalized for Jan 13- Feb 9  . During that time, pt learned she had endometrioma on the R ovary. Pt was placed on hormones for shrink the endometrioma. Pain has escalated with the hormone treatment and the endometrioma grew in size. Due to the   hormone Tx was not helping, they  will be replaced by Lupron once insurances approves. Pt is not wanting to take Lupron.              R abdominal pain below her stoma : Pain is intermittent. There is throbbing pain. The pain triggers nausea 4 x within the last 5 days.  Due to the nausea, pt has not had much of an appetite. Pt has daily BM but it is like IBS, sometimes loose and sometimes hard stools. Stool consistency 50% Type 6-7, 50%  Type 4-5    It has not escalated to the level that lead to the ED visit and the pain readiated to the pelvic area like labor pains but no baby .  Baseline 6-7/10 pain lately and is localized at the flank and groin not pelvic area. Near her stoma bag. When she removes her stoma bag, there is no pain. The pain was triggered with leaning back too far and lifting legs for an exercise. There is not rhyme or reason when the pain occurs. It also occurs when sleeping . It does not matter if she is on her back or side. 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  Pt has tried vaginal Valium  and it helped some but she found it has sedative effect and only uses when she is in desperate situation.  Pt feels the pain is less related to pelvic floor.    Pt have abdominal scars where 2 surgeries were performed.    Gynecologist referred her for Pelvic PT. Pt would like to achieve to 3/10 pain and be able to attend children's activities. Pt would like to have less pain when undergoing vaginal US  exam because they were very painful.    Decrease pain triggering nausea from 4 x  within the last 5 days to < 2 x with one week.  Decrease scar adhesion.   Stool consistency 50% Type 6-7, 50%  Type 4-5   PAIN:  Are you having pain? Has been having sharp stabbing pains, throbbing pain at the flank and  R groin area   PRECAUTIONS: Fall  RED FLAGS: None and hx of bladder removal     WEIGHT BEARING RESTRICTIONS: No  FALLS: Has patient fallen in last 6 months? Yes. Number of falls 3  LIVING ENVIRONMENT: Lives with: lives with their family and lives with their spouse Lives in: House/apartment Stairs: ramp Has following equipment at home: Wheelchair (manual) and Ramped entry  PLOF: Independent with household mobility with device  PATIENT GOALS: to get stronger, do ADLs better.   OBJECTIVE:  Note: Objective measures were completed at Evaluation unless otherwise noted.  DIAGNOSTIC FINDINGS:  MRI results received from [facility] 1. 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 2. Adjacent small acute infarct within the posterior right frontal lobe white matter. 3. Background moderate multifocal T2 FLAIR hyperintense signal abnormality within the cerebral white matter (with a subcortical white matter predominance), greater than expected for age. These signal changes are nonspecific and differential considerations include accelerated chronic small vessel ischemic disease, sequelae of chronic migraine headaches, sequelae of vasculitis/hypercoagulable state and sequelae of a prior infectious/inflammatory process, among others.   MRA head:   Significantly motion degraded examination. No proximal intracranial large vessel occlusion is identified. Motion artifact precludes accurate assessment for stenoses and for vessel irregularity.    COGNITION: Overall cognitive status: Within functional limits for tasks assessed   SENSATION: Light touch: Impaired ; below R knee has decreased  sensation   COORDINATION: Heel shin: tonic shaking movements    MUSCLE TONE: LLE: Moderate RLE Moderate   POSTURE: forward head, increased thoracic kyphosis, posterior pelvic tilt, and flexed trunk     LOWER EXTREMITY MMT:    MMT Right Eval Left Eval  Hip flexion 6.2 5.6  Hip extension    Hip abduction 6.5 6.4  Hip adduction 3.4 3.4  Hip internal rotation    Hip external rotation    Knee flexion 5.1 1.0  Knee extension 7.1 3.2  Ankle dorsiflexion    Ankle plantarflexion    Ankle inversion    Ankle eversion    (Blank rows = not tested)  BED MOBILITY:  Will assess in future session.  TRANSFERS: Assistive device utilized: Environmental consultant - 4 wheeled  Sit to stand: CGA Stand to sit: CGA Chair to chair: Min A     GAIT: Gait pattern: step to pattern, knee flexed in stance- Right, knee flexed in stance- Left, ataxic, trunk flexed, poor foot clearance- Right, and poor foot clearance- Left Distance walked: 8 ft  Assistive device utilized: Walker - 4 wheeled Level of assistance: CGA and Min A Comments: Jerking tonic  gait pattern   FUNCTIONAL TESTS:  5 times sit to stand: 46.33 with hands  10 meter walk test: 1.04 for 8 ft; unable to finish  PATIENT SURVEYS:  Stroke Impact Scale give next session 3  TREATMENT DATE: 10/04/23     TherAct: Static stand at // bar 60 seconds x 2 trials march 10x each LE Sit to stand 10x; 5x then seated rest then 5x  Lateral step 8x length of // bar Leg extension/step back 10x each LE Heel raises 10x with UE support on bar  Dual task with word game reaching and visual scan x 8 minutes  Stand pivot transfer to/from wheelchair to/from table   TherEx Supine: Hamstring stretch 60 seconds each LE Single knee to chest 60 seconds each LE Figure four position 60 seconds each LE Thoracic rotation twists 10x;30 second  holds at final rotation  Posterior pelvic tilt 10x   PATIENT EDUCATION: Education details: goals, POC  Person educated: Patient Education method: Explanation, Demonstration, Tactile cues, and Verbal cues Education comprehension: verbalized understanding, returned demonstration, verbal cues required, and tactile cues required  HOME EXERCISE PROGRAM: See pt instructions  GOALS: Goals reviewed with patient? Yes  SHORT TERM GOALS: Target date: 07/23/2023    Patient will be independent in home exercise program to improve strength/mobility for better functional independence with ADLs. Baseline: 3/10: give next session Goal status: Ongoing    LONG TERM GOALS: Target date: 10/24/2023    Patient (< 13 years old) will complete five times sit to stand test in < 10 seconds indicating an increased LE strength and improved balance. Baseline: 3/10: 46.33 Goal status: Ongoing  2.  Patient will perform 10 MWT under 2 minutes with no rest breaks indicating improved functional ambulation. Baseline: 3/10: 8 ft in 1 min 4 seconds Goal status: Ongoing  3.  Patient will tolerate standing 3 minutes for ADL performance Baseline: 2/95: knee buckling in standing Goal status: Ongoing  4.  Patient will increase BLE gross strength to 4+/5 and/or within 1lb of each limb  as to improve functional strength for independent gait, increased standing tolerance and increased ADL ability. Baseline: 3/10: see above  Goal status: Ongoing               5. Pt will demo increased ab scar mobility to achieve more relaxation of low abdomen and pelvic floor in order to experience <3/10 pain and be able to attend children's activities and  when undergoing vaginal US  exam because they were very painful.  Baseline:Gynecologist referred her for Pelvic PT. Pt would like to achieve to 3/10 pain and be able to attend children's activities. Pt would like to have less pain when undergoing vaginal US  exam because they were very  painful.  Goal status  ONgoing             6.  Decrease pain triggering nausea from 4 x within the last 5 days to < 2 x with one week.                        Baseline:  Decrease pain triggering nausea from 4 x within the last 5 days to < 2 x with one week.     Goal status: ongoing   7. Pt will demo improved stool consistency Type 4-5 to  >75 % in order to minimize ab pain, promote GI , bowel and pelvic floor functio           Baseline:  Stool consistency 50% Type 6-7, 50%  Type 4-5            Goal status: Ongoing   ASSESSMENT:  CLINICAL IMPRESSION:  ***  Patient will benefit from skilled physical therapy  to increase functional mobility, strength, and independence with ADLs.     OBJECTIVE IMPAIRMENTS: Abnormal gait, decreased activity tolerance, decreased balance, decreased coordination, decreased endurance, decreased mobility, difficulty walking, decreased strength, dizziness, impaired perceived functional ability, increased muscle spasms, impaired flexibility, improper body mechanics, and postural dysfunction.   ACTIVITY LIMITATIONS: carrying, lifting, bending, sitting, standing, squatting, sleeping, stairs, transfers, bed mobility, bathing, toileting, dressing, reach over head, hygiene/grooming, locomotion level, and caring for others  PARTICIPATION LIMITATIONS: meal prep, cleaning, laundry, interpersonal relationship, driving, shopping, community activity, yard work, and church  PERSONAL FACTORS: Age, Past/current experiences, Time since onset of injury/illness/exacerbation, Transportation, and 3+ comorbidities: adrenal insufficiency, Hereditary spastic paraplegia (HSP), small fiber neuropathy c/b BLE paraplegia and neurogenic bladder s/p cystectomy w/ ileal condiuit (05/2022), chronic pain, migraine are also affecting patient's functional outcome.   REHAB POTENTIAL: Good  CLINICAL DECISION MAKING: Evolving/moderate complexity  EVALUATION COMPLEXITY: Moderate  PLAN:  PT  FREQUENCY: 2x/week  PT DURATION: 12 weeks  PLANNED INTERVENTIONS: 97164- PT Re-evaluation, 97110-Therapeutic exercises, 97530- Therapeutic activity, 97112- Neuromuscular re-education, 97535- Self Care, 29528- Manual therapy, [id number]- Gait training, [phone]- Orthotic Fit/training, [phone]- Canalith repositioning, [id number]- Splinting, [id number]- Electrical stimulation (unattended), [phone]- Electrical stimulation (manual), [id number]- Vasopneumatic device, [id number]- Ultrasound, [id number]- Traction (mechanical), [id number]- Ionotophoresis 4mg /ml Dexamethasone , Patient/Family education, Balance training, Stair training, Taping, Dry Needling, Joint mobilization, Joint manipulation, Spinal manipulation, Spinal mobilization, Scar mobilization, Compression bandaging, Vestibular training, Visual/preceptual remediation/compensation, DME instructions, Wheelchair mobility training, Cryotherapy, Moist heat, and Biofeedback  PLAN FOR NEXT SESSION: Address R side of abdomen fascial restrictions    Jyssica Rief, PT 10/04/2023, 10:47 AM

## 2023-10-08 ENCOUNTER — Ambulatory Visit: Payer: BC Managed Care – PPO

## 2023-10-08 ENCOUNTER — Ambulatory Visit: Admitting: Occupational Therapy

## 2023-10-08 DIAGNOSIS — R2681 Unsteadiness on feet: Secondary | ICD-10-CM

## 2023-10-08 DIAGNOSIS — M6281 Muscle weakness (generalized): Secondary | ICD-10-CM | POA: Diagnosis not present

## 2023-10-08 DIAGNOSIS — R262 Difficulty in walking, not elsewhere classified: Secondary | ICD-10-CM

## 2023-10-08 DIAGNOSIS — R278 Other lack of coordination: Secondary | ICD-10-CM

## 2023-10-08 NOTE — Therapy (Incomplete)
 Occupational Therapy Progress/Recertification Note  Dates of reporting period  06/27/2023   to   10/03/2023  Patient Name: Christine Chavez MRN: [id number] DOB:Aug 26, 1980, 43 y.o., female Today's Date: 10/08/2023  PCP: Damien Ryder, PA-C REFERRING PROVIDER: Maree Jannett POUR, MD   OT End of Session - 10/08/23 1657     Visit Number 11    Number of Visits 24    Date for OT Re-Evaluation 12/26/23    OT Start Time 1315    OT Stop Time 1400    OT Time Calculation (min) 45 min    Activity Tolerance Patient tolerated treatment well    Behavior During Therapy WFL for tasks assessed/performed            Past Medical History:  Diagnosis Date   Bladder retention    Complication of anesthesia    ? seizures after anesthesia    Depression    Diarrhea due to malabsorption    Dizziness    Headache    Migraines    Neurogenic bladder    Pyelonephritis    Renal disorder    Vision abnormalities    Weight loss    Past Surgical History:  Procedure Laterality Date   ANTERIOR CRUCIATE LIGAMENT REPAIR  1997   APPENDECTOMY     BLADDER REMOVAL     COLONOSCOPY WITH PROPOFOL  N/A 11/11/2018   Procedure: COLONOSCOPY WITH PROPOFOL ;  Surgeon: Gaylyn Gladis PENNER, MD;  Location: [facility] ENDOSCOPY;  Service: Endoscopy;  Laterality: N/A;   CYSTOSCOPY WITH STENT PLACEMENT Right 04/17/2016   Procedure: CYSTOSCOPY WITH STENT PLACEMENT;  Surgeon: Belvie LITTIE Clara, MD;  Location: [facility] ORS;  Service: Urology;  Laterality: Right;   ESOPHAGOGASTRODUODENOSCOPY (EGD) WITH PROPOFOL  N/A 11/11/2018   Procedure: ESOPHAGOGASTRODUODENOSCOPY (EGD) WITH PROPOFOL ;  Surgeon: Gaylyn Gladis PENNER, MD;  Location: [facility] ENDOSCOPY;  Service: Endoscopy;  Laterality: N/A;   EXPLORATORY LAPAROTOMY  1999   HIP SURGERY Right    KIDNEY STONE SURGERY  04/2016   REVISION UROSTOMY CUTANEOUS     REVISION UROSTOMY CUTANEOUS  01/10/2018   SUPRAPUBIC CATHETER PLACEMENT  08/2017   TONSILLECTOMY     Patient Active Problem List    Diagnosis Date Noted   Stroke-like symptoms 06/13/2023   Refractory nausea and vomiting 02/22/2023   Syncope and collapse 02/19/2023   Palliative care by specialist 02/19/2023   UTI (urinary tract infection) 02/13/2023   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   Neurogenic bladder 02/13/2023   Hepatic steatosis 02/13/2023   Wheelchair dependent 02/13/2023   Secondary adrenal insufficiency (HCC) 02/13/2023   Right flank pain 02/13/2023   Iron deficiency anemia 10/02/2022   Seizure (HCC) 11/11/2018   Major depressive disorder, recurrent episode, moderate (HCC) 02/07/2018   Nephrolithiasis 04/16/2016   Numbness 07/28/2015   Bladder retention 06/23/2015   Abdominal pain 06/04/2015   Dizziness 05/18/2015   Neck pain 05/18/2015   Complicated migraine 04/28/2015   Other fatigue 04/28/2015   Abnormal finding on MRI of brain 04/28/2015   Diarrhea 03/29/2015   H/O disease 03/29/2015   Abnormal weight loss 03/29/2015   Muscle weakness (generalized) 03/14/2015   Headache, migraine 03/10/2015    ONSET DATE: 06/13/23  REFERRING DIAG: CVA, HSP  THERAPY DIAG:  Muscle weakness (generalized)  Other lack of coordination  Rationale for Evaluation and Treatment: Rehabilitation  SUBJECTIVE:   SUBJECTIVE STATEMENT:  Pt. Reports feeling fatigued in the LUE more than the RUE during the session while working on functional reaching tasks.   Pt accompanied by: significant other Husband: Deward  PERTINENT HISTORY:   Pt. is a 43 y.o. female who was hospitalized with a CVA from 06/13/23-06/14/23.  Imaging revealed two small acute cortical infarcts within the posterior Right frontal lobe (with involvement in the motor strip). PMHx includes: HSP With a  recent hospitalization from January 13-February 19th at [facility] 2/2 a Neuro Flare from the HSP. Hx of small fiber neuropathy d/t BLE paraplegia, Neurogenic bladder s/p cystectomy with ideal conduit (2/24) ,chronic pain, migraine. Pt.  Reports multiple hospitalizations of the past year.  PRECAUTIONS: None  WEIGHT BEARING RESTRICTIONS: No  PAIN:  Are you having pain? No  10/08/2023: Pt reports 4/10 aching pain in the BUE.   FALLS: Has patient fallen in last 6 months? Yes. Number of falls 3  LIVING ENVIRONMENT: Lives with: lives with their family Lives in: House/apartment, One level Stairs:  ramp. Has following equipment at home: Vannie - 4 wheeled, Wheelchair (power), Wheelchair (manual), Shower bench, and bed side commode, bed rail  PLOF: Independent  PATIENT GOALS:  Regain strength, and hand coordination  OBJECTIVE:  Note: Objective measures were completed at Evaluation unless otherwise noted.  HAND DOMINANCE: Right  ADLs: Overall ADLs:  Transfers/ambulation related to ADLs: Eating:  Independent, Cutting. Difficulty opening  Grooming: Independent UB Dressing: Independent LB Dressing: Independent, set-up Toileting:  Independent, Ostomy Bag Bathing: Independent once in the shower            Shower transfers: Husband assists with shower transfers.   IADLs: Shopping: Orders online. Husband picks them up Light housekeeping: Family assists Meal Prep: Engineer, structural, Assist with meals, cutting Community mobility: Relies on family, and friends Medication management: Independent Financial management: No change Handwriting: 100% legible, Pain/fatigues with writing note cards. Hobbies: Making gifts baskets; shopping ([employer] is a favorite)  MOBILITY STATUS: Uses a w/c  POSTURE COMMENTS:   Sitting balance:  WFL  ACTIVITY TOLERANCE: Activity tolerance: limited  FUNCTIONAL OUTCOME MEASURES: MAM-20: Sum score: 73/80; MAM Measure score: 71.8  UPPER EXTREMITY ROM:    Active ROM Right Eval WFL Left Eval [facility]  Shoulder flexion    Shoulder abduction    Shoulder adduction    Shoulder extension    Shoulder internal rotation    Shoulder external rotation    Elbow flexion    Elbow extension    Wrist  flexion    Wrist extension    Wrist ulnar deviation    Wrist radial deviation    Wrist pronation    Wrist supination    (Blank rows = not tested)  UPPER EXTREMITY MMT:     MMT Right eval Right 10/03/23 Left eval Left 10/03/23  Shoulder flexion 3+/5 3+/5 3+/5 3+/5  Shoulder abduction 3+/5 3+/5 3+/5 3+/5  Shoulder adduction      Shoulder extension      Shoulder internal rotation      Shoulder external rotation      Middle trapezius      Lower trapezius      Elbow flexion 3+/5 3+/5 3+/5 3+/5  Elbow extension 3+/5 3+/5 3+/5 3+/5  Wrist flexion      Wrist extension 3+/5 3+/5 3+/5 3+/5  Wrist ulnar deviation      Wrist radial deviation      Wrist pronation 3+/5 3+/5 3+/5 3+/5  Wrist supination 3+/5 3+/5 3+/5 3+/5  (Blank rows = not tested)  HAND FUNCTION: Eval: Grip strength: Right: 5 lbs; Left: 2 lbs and Lateral pinch: Right: 5 lbs, Left: 2 lbs, 3pt. Pinch: R: 5 lbs, L: 2 lbs;  2pt. Pinch: R: 1 lbs, L: 1lb  10/03/23: Grip strength: Right: 15 lbs; Left: 3 lbs and Lateral pinch: Right: 4 lbs, Left: 2 lbs, 3pt. Pinch: R: 3 lbs, L: 2 lbs  COORDINATION:  COORDINATION:   Eval: 9 Hole Peg test: Right: 25 sec; Left: 31 sec  10/03/23:  9 Hole Peg test: Right: 34 sec; Left: 41 sec  SENSATION: WFL light touch, and proprioceptive awareness  EDEMA: N/A   COGNITION: Overall cognitive status: Within functional limits for tasks assessed  VISION: Subjective report: Reports blurriness  PERCEPTION: WFL  PRAXIS: WFL                                                                                                                           TREATMENT DATE: 10/08/2023   Therapeutic Activities:  -Bilateral Lateral, and 3pt. Pinch strengthening using yellow, red, green resistive clips to clip onto horizontal dowel in multiple planes to promote functional reaching and sustained pinch.   -Pt. Removed yellow, red, and green resistive clips from horizontal dowel in the frontal plane, to  pinch 1/16 circular beads with resistive clips from tabletop surface to promote sustained controlled pinch in elbow extension for functional reaching.  -Pt. Performed 2 sets of translatory movements moving Jumbo Pegs from the palm to the tips of the thumb and the 2nd digit, in preparation for placing Jumbo Pegs into Pegboard.  -Pt. Performed 2 sets of Grip strength using 11.2# of force to grip Jumbo pegs from Jumbo Pegboard and discard pegs into container in high and low planes to promote sustained grip strength in elbow extension for functional reaching.   -Facilitated BUE functional reaching and [facility] skills manipulating resistive washers on magnetic dish, promoting translatory movements, moving washers from the palm to the tip of the thumb and the 2nd digit in preparation for placing washers on horizontal dowel in multiple planes.  Neuro Muscular Re-Ed:  -facilitated [facility] tasks using the Grooved pegboard, grasping 1 grooved pegs from shallow dish, following with translatory movements, moving grooved pegs from the palm to the tips of the thumb and 2nd digit to place grooved pegs into pegboard.    PATIENT EDUCATION: Education details: UE functioning, strengthening Person educated: Patient and Spouse Education method: Museum/gallery conservator cues,verbal cues Education comprehension: verbalized understanding and needs further education  HOME EXERCISE PROGRAM:  Continue ongoing assessment, and provide as indicated   GOALS: Goals reviewed with patient? Yes  SHORT TERM GOALS: Target date: 11/14/2023   Pt. Will be independent with HEPs for BUEs. Baseline: 10/03/23: Independent with HEPs; continue to provide, and upgrade as indicated Eval:No current HEP Goal status: Ongoing   LONG TERM GOALS: Target date: 12/26/2023  Pt. Will be improve BUE strength by 2 mm grades to be able to independently reach for ADL/IADL items in multiple planes. Baseline: 10/03/23: BUE strength: 3+/5 overall. Eval:  BUE strength: 3+/5 overall  Goal status: Ongoing  2.  Pt. will improve bilateral grip strength to be able  to securely hold items. Baseline: 10/03/23: R: 15# L: 3# Eval: R: 5#, L: 2# Goal status: Ongoing  3.  Pt. Will improve bilateral lateral pinch strength by 3# to be able to open medication bottles Baseline: 10/03/23: R: 4# L: 2# L: Eval: R: 5#, L: 2# Goal status: Ongoing  4.  Pt. Will improve bilateral 3pt. pinch strength to be able to independently use a nail clipper Baseline: 10/03/23: R: 3# L: 2# Eval: R: 3#, L: 2# Goal status: Ongoing  5.  Pt. Will improve lef [facility] skills to be able to manipulate small objects. Baseline: 10/03/23: R: 34 sec., L: 41 sec. Eval: R: 25 sec., L: 31 sec. Goal status: Ongoing  ASSESSMENT:  CLINICAL IMPRESSION:  Pt. Reports making an appointment with the [facility] clinic soon.  Pt. Reports feeling fatigued in the LUE during functional reaching tasks during treatment session today. Pt. Has increased grip strength tolerating 11.2# of force during Jumbo Pegboard activity. Pt. Has difficulty with activity tolerance in functional reaching tasks while sustaining elbow extension in combination of [facility] tasks. Pt. Has difficulty manipulating small objects and sustaining grip while in elbow extension. Pt. continues to benefit from OT services to work on improving BUE functioning in order to improve engagement in, and participation in ADL, and IADL functioning.   PERFORMANCE DEFICITS: in functional skills including ADLs, IADLs, coordination, dexterity, proprioception, ROM, strength, Fine motor control, Gross motor control, and UE functional use, cognitive skills including , and psychosocial skills including environmental adaptation, routines, and behaviors.   IMPAIRMENTS: are limiting patient from ADLs, IADLs, and leisure.   CO-MORBIDITIES: may have co-morbidities  that affects occupational performance. Patient will benefit from skilled OT to address above impairments and  improve overall function.  MODIFICATION OR ASSISTANCE TO COMPLETE EVALUATION: Min-Moderate modification of tasks or assist with assess necessary to complete an evaluation.  OT OCCUPATIONAL PROFILE AND HISTORY: Detailed assessment: Review of records and additional review of physical, cognitive, psychosocial history related to current functional performance.  CLINICAL DECISION MAKING: Moderate - several treatment options, min-mod task modification necessary  REHAB POTENTIAL: Good  EVALUATION COMPLEXITY: Moderate    PLAN:  OT FREQUENCY: 2x/week  OT DURATION: 12 weeks  PLANNED INTERVENTIONS: 97168 OT Re-evaluation, 97535 self care/ADL training, 02889 therapeutic exercise, 97530 therapeutic activity, 97112 neuromuscular re-education, 97018 paraffin, 02965 contrast bath, 97760 Orthotics management and training, 02239 Splinting (initial encounter), functional mobility training, patient/family education, and DME and/or AE instructions  RECOMMENDED OTHER SERVICES: PT  CONSULTED AND AGREED WITH PLAN OF CARE: Patient and family member/caregiver  PLAN FOR NEXT SESSION: Treatment  Richardson Otter, MS, OTR/L  10/08/2023, 5:00 PM

## 2023-10-09 NOTE — Therapy (Signed)
 OUTPATIENT PHYSICAL THERAPY NEURO TREATMENT   Patient Name: Christine Chavez MRN: [id number] DOB:16-Nov-1980, 43 y.o., female Today's Date: 10/10/2023   PCP: Johnie Perkins REFERRING PROVIDER: Maree Hila  END OF SESSION:  PT End of Session - 10/10/23 1055     Visit Number 14    Number of Visits 36    Date for PT Re-Evaluation 11/07/23   PN visit 10- 08/23/23   PT Start Time 1100    PT Stop Time 1140    PT Time Calculation (min) 40 min    Equipment Utilized During Treatment Gait belt    Activity Tolerance Patient tolerated treatment well;Patient limited by fatigue    Behavior During Therapy WFL for tasks assessed/performed                Past Medical History:  Diagnosis Date   Bladder retention    Complication of anesthesia    ? seizures after anesthesia    Depression    Diarrhea due to malabsorption    Dizziness    Headache    Migraines    Neurogenic bladder    Pyelonephritis    Renal disorder    Vision abnormalities    Weight loss    Past Surgical History:  Procedure Laterality Date   ANTERIOR CRUCIATE LIGAMENT REPAIR  1997   APPENDECTOMY     BLADDER REMOVAL     COLONOSCOPY WITH PROPOFOL  N/A 11/11/2018   Procedure: COLONOSCOPY WITH PROPOFOL ;  Surgeon: Gaylyn Gladis PENNER, MD;  Location: [facility] ENDOSCOPY;  Service: Endoscopy;  Laterality: N/A;   CYSTOSCOPY WITH STENT PLACEMENT Right 04/17/2016   Procedure: CYSTOSCOPY WITH STENT PLACEMENT;  Surgeon: Belvie LITTIE Clara, MD;  Location: [facility] ORS;  Service: Urology;  Laterality: Right;   ESOPHAGOGASTRODUODENOSCOPY (EGD) WITH PROPOFOL  N/A 11/11/2018   Procedure: ESOPHAGOGASTRODUODENOSCOPY (EGD) WITH PROPOFOL ;  Surgeon: Gaylyn Gladis PENNER, MD;  Location: [facility] ENDOSCOPY;  Service: Endoscopy;  Laterality: N/A;   EXPLORATORY LAPAROTOMY  1999   HIP SURGERY Right    KIDNEY STONE SURGERY  04/2016   REVISION UROSTOMY CUTANEOUS     REVISION UROSTOMY CUTANEOUS  01/10/2018   SUPRAPUBIC CATHETER PLACEMENT  08/2017    TONSILLECTOMY     Patient Active Problem List   Diagnosis Date Noted   Stroke-like symptoms 06/13/2023   Refractory nausea and vomiting 02/22/2023   Syncope and collapse 02/19/2023   Palliative care by specialist 02/19/2023   UTI (urinary tract infection) 02/13/2023   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   Neurogenic bladder 02/13/2023   Hepatic steatosis 02/13/2023   Wheelchair dependent 02/13/2023   Secondary adrenal insufficiency (HCC) 02/13/2023   Right flank pain 02/13/2023   Iron deficiency anemia 10/02/2022   Seizure (HCC) 11/11/2018   Major depressive disorder, recurrent episode, moderate (HCC) 02/07/2018   Nephrolithiasis 04/16/2016   Numbness 07/28/2015   Bladder retention 06/23/2015   Abdominal pain 06/04/2015   Dizziness 05/18/2015   Neck pain 05/18/2015   Complicated migraine 04/28/2015   Other fatigue 04/28/2015   Abnormal finding on MRI of brain 04/28/2015   Diarrhea 03/29/2015   H/O disease 03/29/2015   Abnormal weight loss 03/29/2015   Muscle weakness (generalized) 03/14/2015   Headache, migraine 03/10/2015    ONSET DATE: 06/13/23  REFERRING DIAG: CVA  THERAPY DIAG:  Muscle weakness (generalized)  Unsteadiness on feet  Difficulty in walking, not elsewhere classified  Rationale for Evaluation and Treatment: Rehabilitation  SUBJECTIVE:  SUBJECTIVE STATEMENT:  Patient reports feeling tired but ok. Has been having a lot of pain.   Pt was  accompanied by: significant other  ( not today)      PERTINENT HISTORY:  Patient is referred to PT s/p CVA.  Patient presents to PT s/p CVA on 06/13/23.  Pmh includes adrenal insufficiency, Hereditary spastic paraplegia (HSP), small fiber neuropathy c/b BLE paraplegia and neurogenic bladder s/p cystectomy w/ ileal  condiuit (05/2022), chronic pain, migraine. Patient was recently hospitalized prior to this at [facility] from 04/30/23 to 05/27/23. Patient reports L sided weakness. Is having stabbing pains for a few minutes.   Patient presents to pelvic PT and provides this information:  Pt was sent to ED March 31 at [facility] for R abdominal pain below her stoma. Low blood flow to her ovary . Pt saw her gynecologist yesterday and they are going to try to a tap block to address the pain which she is caused by multifactorial issues.  Pt had hysterectomy in 2024 and fallopian tubes and uterus , bladder and urethra were removed. Ovaries remained to prevent from early menopause.  Pt had kidney infections that led her be hospitalized for Jan 13- Feb 9  . During that time, pt learned she had endometrioma on the R ovary. Pt was placed on hormones for shrink the endometrioma. Pain has escalated with the hormone treatment and the endometrioma grew in size. Due to the   hormone Tx was not helping, they  will be replaced by Lupron once insurances approves. Pt is not wanting to take Lupron.              R abdominal pain below her stoma : Pain is intermittent. There is throbbing pain. The pain triggers nausea 4 x within the last 5 days.  Due to the nausea, pt has not had much of an appetite. Pt has daily BM but it is like IBS, sometimes loose and sometimes hard stools. Stool consistency 50% Type 6-7, 50%  Type 4-5    It has not escalated to the level that lead to the ED visit and the pain readiated to the pelvic area like labor pains but no baby .  Baseline 6-7/10 pain lately and is localized at the flank and groin not pelvic area. Near her stoma bag. When she removes her stoma bag, there is no pain. The pain was triggered with leaning back too far and lifting legs for an exercise. There is not rhyme or reason when the pain occurs. It also occurs when sleeping . It does not matter if she is on her back or side. Pt uses heating  pad helps to relax the muscles from tightening related to HSP.  Pt has tried vaginal Valium  and it helped some but she found it has sedative effect and only uses when she is in desperate situation.  Pt feels the pain is less related to pelvic floor.    Pt have abdominal scars where 2 surgeries were performed.    Gynecologist referred her for Pelvic PT. Pt would like to achieve to 3/10 pain and be able to attend children's activities. Pt would like to have less pain when undergoing vaginal US  exam because they were very painful.    Decrease pain triggering nausea from 4 x within the last 5 days to < 2 x with one week.  Decrease scar adhesion.   Stool consistency 50% Type 6-7, 50%  Type 4-5   PAIN:  Are you having  pain? Has been having sharp stabbing pains, throbbing pain at the flank and R groin area   PRECAUTIONS: Fall  RED FLAGS: None and hx of bladder removal     WEIGHT BEARING RESTRICTIONS: No  FALLS: Has patient fallen in last 6 months? Yes. Number of falls 3  LIVING ENVIRONMENT: Lives with: lives with their family and lives with their spouse Lives in: House/apartment Stairs: ramp Has following equipment at home: Wheelchair (manual) and Ramped entry  PLOF: Independent with household mobility with device  PATIENT GOALS: to get stronger, do ADLs better.   OBJECTIVE:  Note: Objective measures were completed at Evaluation unless otherwise noted.  DIAGNOSTIC FINDINGS:  MRI results received from [facility] 1. 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 2. Adjacent small acute infarct within the posterior right frontal lobe white matter. 3. Background moderate multifocal T2 FLAIR hyperintense signal abnormality within the cerebral white matter (with a subcortical white matter predominance), greater than expected for age. These signal changes are nonspecific and differential considerations include  accelerated chronic small vessel ischemic disease, sequelae of chronic migraine headaches, sequelae of vasculitis/hypercoagulable state and sequelae of a prior infectious/inflammatory process, among others.   MRA head:   Significantly motion degraded examination. No proximal intracranial large vessel occlusion is identified. Motion artifact precludes accurate assessment for stenoses and for vessel irregularity.    COGNITION: Overall cognitive status: Within functional limits for tasks assessed   SENSATION: Light touch: Impaired ; below R knee has decreased sensation   COORDINATION: Heel shin: tonic shaking movements    MUSCLE TONE: LLE: Moderate RLE Moderate   POSTURE: forward head, increased thoracic kyphosis, posterior pelvic tilt, and flexed trunk     LOWER EXTREMITY MMT:    MMT Right Eval Left Eval  Hip flexion 6.2 5.6  Hip extension    Hip abduction 6.5 6.4  Hip adduction 3.4 3.4  Hip internal rotation    Hip external rotation    Knee flexion 5.1 1.0  Knee extension 7.1 3.2  Ankle dorsiflexion    Ankle plantarflexion    Ankle inversion    Ankle eversion    (Blank rows = not tested)  BED MOBILITY:  Will assess in future session.  TRANSFERS: Assistive device utilized: Environmental consultant - 4 wheeled  Sit to stand: CGA Stand to sit: CGA Chair to chair: Min A     GAIT: Gait pattern: step to pattern, knee flexed in stance- Right, knee flexed in stance- Left, ataxic, trunk flexed, poor foot clearance- Right, and poor foot clearance- Left Distance walked: 8 ft  Assistive device utilized: Walker - 4 wheeled Level of assistance: CGA and Min A Comments: Jerking tonic  gait pattern   FUNCTIONAL TESTS:  5 times sit to stand: 46.33 with hands  10 meter walk test: 1.04 for 8 ft; unable to finish  PATIENT SURVEYS:  Stroke Impact Scale give next session 3  TREATMENT DATE: 10/10/23     TherAct:  In // bars:  -forward/backwards ambulation in // bars 6x -lateral step 6x length of // bars -mini squats in // bars 10x  TherEx  GTB abduction 15x GTB  march 15x GTB hamstring curl 10x each LE   Neuro RE-ed: Standing with CGA next to support surface:  Airex pad: static stand 30 seconds x 2 trials, noticeable trembling of ankles/LE's with fatigue and challenge to maintain stability Airex pad: horizontal head turns  scanning room 10x ; cueing for arc of motion  Airex pad: vertical head turns 30 seconds, cueing for arc of motion, noticeable sway with upward gaze increasing demand on ankle righting reaction musculature     PATIENT EDUCATION: Education details: goals, POC  Person educated: Patient Education method: Explanation, Demonstration, Tactile cues, and Verbal cues Education comprehension: verbalized understanding, returned demonstration, verbal cues required, and tactile cues required  HOME EXERCISE PROGRAM: See pt instructions  GOALS: Goals reviewed with patient? Yes  SHORT TERM GOALS: Target date: 07/23/2023    Patient will be independent in home exercise program to improve strength/mobility for better functional independence with ADLs. Baseline: 3/10: give next session Goal status: Ongoing    LONG TERM GOALS: Target date: 10/24/2023    Patient (< 63 years old) will complete five times sit to stand test in < 10 seconds indicating an increased LE strength and improved balance. Baseline: 3/10: 46.33 Goal status: Ongoing  2.  Patient will perform 10 MWT under 2 minutes with no rest breaks indicating improved functional ambulation. Baseline: 3/10: 8 ft in 1 min 4 seconds Goal status: Ongoing  3.  Patient will tolerate standing 3 minutes for ADL performance Baseline: 6/89: knee buckling in standing Goal status: Ongoing  4.  Patient will increase BLE gross strength to 4+/5 and/or within 1lb of each limb  as  to improve functional strength for independent gait, increased standing tolerance and increased ADL ability. Baseline: 3/10: see above  Goal status: Ongoing               5. Pt will demo increased ab scar mobility to achieve more relaxation of low abdomen and pelvic floor in order to experience <3/10 pain and be able to attend children's activities and  when undergoing vaginal US  exam because they were very painful.  Baseline:Gynecologist referred her for Pelvic PT. Pt would like to achieve to 3/10 pain and be able to attend children's activities. Pt would like to have less pain when undergoing vaginal US  exam because they were very painful.  Goal status  ONgoing             6.  Decrease pain triggering nausea from 4 x within the last 5 days to < 2 x with one week.                        Baseline:  Decrease pain triggering nausea from 4 x within the last 5 days to < 2 x with one week.     Goal status: ongoing   7. Pt will demo improved stool consistency Type 4-5 to  >75 % in order to minimize ab pain, promote GI , bowel and pelvic floor functio           Baseline:  Stool consistency 50% Type 6-7, 50%  Type 4-5            Goal status: Ongoing   ASSESSMENT:  CLINICAL IMPRESSION: Patient presents with  excellent motivation. She is eager to progress functional mobiilty despite her pain levels. She has increased tremors in standing with fatigue that improve with seated rest breaks. Airex pad is challenging for patient.   Patient will benefit from skilled physical therapy to increase functional mobility, strength, and independence with ADLs.     OBJECTIVE IMPAIRMENTS: Abnormal gait, decreased activity tolerance, decreased balance, decreased coordination, decreased endurance, decreased mobility, difficulty walking, decreased strength, dizziness, impaired perceived functional ability, increased muscle spasms, impaired flexibility, improper body mechanics, and postural dysfunction.   ACTIVITY  LIMITATIONS: carrying, lifting, bending, sitting, standing, squatting, sleeping, stairs, transfers, bed mobility, bathing, toileting, dressing, reach over head, hygiene/grooming, locomotion level, and caring for others  PARTICIPATION LIMITATIONS: meal prep, cleaning, laundry, interpersonal relationship, driving, shopping, community activity, yard work, and church  PERSONAL FACTORS: Age, Past/current experiences, Time since onset of injury/illness/exacerbation, Transportation, and 3+ comorbidities: adrenal insufficiency, Hereditary spastic paraplegia (HSP), small fiber neuropathy c/b BLE paraplegia and neurogenic bladder s/p cystectomy w/ ileal condiuit (05/2022), chronic pain, migraine are also affecting patient's functional outcome.   REHAB POTENTIAL: Good  CLINICAL DECISION MAKING: Evolving/moderate complexity  EVALUATION COMPLEXITY: Moderate  PLAN:  PT FREQUENCY: 2x/week  PT DURATION: 12 weeks  PLANNED INTERVENTIONS: 97164- PT Re-evaluation, 97110-Therapeutic exercises, 97530- Therapeutic activity, 97112- Neuromuscular re-education, 97535- Self Care, [id number]- Manual therapy, [id number]- Gait training, [phone]- Orthotic Fit/training, [phone]- Canalith repositioning, [id number]- Splinting, [id number]- Electrical stimulation (unattended), [phone]- Electrical stimulation (manual), [id number]- Vasopneumatic device, [id number]- Ultrasound, [id number]- Traction (mechanical), [id number]- Ionotophoresis 4mg /ml Dexamethasone , Patient/Family education, Balance training, Stair training, Taping, Dry Needling, Joint mobilization, Joint manipulation, Spinal manipulation, Spinal mobilization, Scar mobilization, Compression bandaging, Vestibular training, Visual/preceptual remediation/compensation, DME instructions, Wheelchair mobility training, Cryotherapy, Moist heat, and Biofeedback  PLAN FOR NEXT SESSION: Address R side of abdomen fascial restrictions    Ordean Fouts, PT 10/10/2023, 11:53 AM

## 2023-10-10 ENCOUNTER — Ambulatory Visit: Admitting: Occupational Therapy

## 2023-10-10 ENCOUNTER — Ambulatory Visit: Payer: BC Managed Care – PPO

## 2023-10-10 DIAGNOSIS — M6281 Muscle weakness (generalized): Secondary | ICD-10-CM

## 2023-10-10 DIAGNOSIS — R278 Other lack of coordination: Secondary | ICD-10-CM

## 2023-10-10 DIAGNOSIS — R2681 Unsteadiness on feet: Secondary | ICD-10-CM

## 2023-10-10 DIAGNOSIS — R262 Difficulty in walking, not elsewhere classified: Secondary | ICD-10-CM

## 2023-10-10 NOTE — Therapy (Cosign Needed)
 Occupational Therapy Neuro Treatment Note  Patient Name: Christine Chavez MRN: [id number] DOB:09-15-1980, 43 y.o., female Today's Date: 10/10/2023  PCP: Damien Ryder, PA-C REFERRING PROVIDER: Maree Jannett POUR, MD   OT End of Session - 10/10/23 1529     Visit Number 12    Number of Visits 24    Date for OT Re-Evaluation 12/26/23    OT Start Time 1145    OT Stop Time 1230    OT Time Calculation (min) 45 min    Activity Tolerance Patient tolerated treatment well    Behavior During Therapy WFL for tasks assessed/performed             Past Medical History:  Diagnosis Date   Bladder retention    Complication of anesthesia    ? seizures after anesthesia    Depression    Diarrhea due to malabsorption    Dizziness    Headache    Migraines    Neurogenic bladder    Pyelonephritis    Renal disorder    Vision abnormalities    Weight loss    Past Surgical History:  Procedure Laterality Date   ANTERIOR CRUCIATE LIGAMENT REPAIR  1997   APPENDECTOMY     BLADDER REMOVAL     COLONOSCOPY WITH PROPOFOL  N/A 11/11/2018   Procedure: COLONOSCOPY WITH PROPOFOL ;  Surgeon: Gaylyn Gladis PENNER, MD;  Location: [facility] ENDOSCOPY;  Service: Endoscopy;  Laterality: N/A;   CYSTOSCOPY WITH STENT PLACEMENT Right 04/17/2016   Procedure: CYSTOSCOPY WITH STENT PLACEMENT;  Surgeon: Belvie LITTIE Clara, MD;  Location: [facility] ORS;  Service: Urology;  Laterality: Right;   ESOPHAGOGASTRODUODENOSCOPY (EGD) WITH PROPOFOL  N/A 11/11/2018   Procedure: ESOPHAGOGASTRODUODENOSCOPY (EGD) WITH PROPOFOL ;  Surgeon: Gaylyn Gladis PENNER, MD;  Location: [facility] ENDOSCOPY;  Service: Endoscopy;  Laterality: N/A;   EXPLORATORY LAPAROTOMY  1999   HIP SURGERY Right    KIDNEY STONE SURGERY  04/2016   REVISION UROSTOMY CUTANEOUS     REVISION UROSTOMY CUTANEOUS  01/10/2018   SUPRAPUBIC CATHETER PLACEMENT  08/2017   TONSILLECTOMY     Patient Active Problem List   Diagnosis Date Noted   Stroke-like symptoms 06/13/2023    Refractory nausea and vomiting 02/22/2023   Syncope and collapse 02/19/2023   Palliative care by specialist 02/19/2023   UTI (urinary tract infection) 02/13/2023   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   Neurogenic bladder 02/13/2023   Hepatic steatosis 02/13/2023   Wheelchair dependent 02/13/2023   Secondary adrenal insufficiency (HCC) 02/13/2023   Right flank pain 02/13/2023   Iron deficiency anemia 10/02/2022   Seizure (HCC) 11/11/2018   Major depressive disorder, recurrent episode, moderate (HCC) 02/07/2018   Nephrolithiasis 04/16/2016   Numbness 07/28/2015   Bladder retention 06/23/2015   Abdominal pain 06/04/2015   Dizziness 05/18/2015   Neck pain 05/18/2015   Complicated migraine 04/28/2015   Other fatigue 04/28/2015   Abnormal finding on MRI of brain 04/28/2015   Diarrhea 03/29/2015   H/O disease 03/29/2015   Abnormal weight loss 03/29/2015   Muscle weakness (generalized) 03/14/2015   Headache, migraine 03/10/2015    ONSET DATE: 06/13/23  REFERRING DIAG: CVA, HSP  THERAPY DIAG:  Muscle weakness (generalized)  Other lack of coordination  Rationale for Evaluation and Treatment: Rehabilitation  SUBJECTIVE:   SUBJECTIVE STATEMENT:  Pt. Reports feeling tired today due to lack of sleep last night.    Pt accompanied by: significant other Husband: Christine Chavez  PERTINENT HISTORY:   Pt. is a 43 y.o. female who was hospitalized with a CVA from  06/13/23-06/14/23.  Imaging revealed two small acute cortical infarcts within the posterior Right frontal lobe (with involvement in the motor strip). PMHx includes: HSP With a  recent hospitalization from January 13-February 19th at [facility] 2/2 a Neuro Flare from the HSP. Hx of small fiber neuropathy d/t BLE paraplegia, Neurogenic bladder s/p cystectomy with ideal conduit (2/24) ,chronic pain, migraine. Pt. Reports multiple hospitalizations of the past year.  PRECAUTIONS: None  WEIGHT BEARING RESTRICTIONS: No  PAIN:   Are you having pain? No  10/08/2023: Pt reports 4/10 aching pain in the BUE.   FALLS: Has patient fallen in last 6 months? Yes. Number of falls 3  LIVING ENVIRONMENT: Lives with: lives with their family Lives in: House/apartment, One level Stairs:  ramp. Has following equipment at home: Vannie - 4 wheeled, Wheelchair (power), Wheelchair (manual), Shower bench, and bed side commode, bed rail  PLOF: Independent  PATIENT GOALS:  Regain strength, and hand coordination  OBJECTIVE:  Note: Objective measures were completed at Evaluation unless otherwise noted.  HAND DOMINANCE: Right  ADLs: Overall ADLs:  Transfers/ambulation related to ADLs: Eating:  Independent, Cutting. Difficulty opening  Grooming: Independent UB Dressing: Independent LB Dressing: Independent, set-up Toileting:  Independent, Ostomy Bag Bathing: Independent once in the shower            Shower transfers: Husband assists with shower transfers.   IADLs: Shopping: Orders online. Husband picks them up Light housekeeping: Family assists Meal Prep: Engineer, structural, Assist with meals, cutting Community mobility: Relies on family, and friends Medication management: Independent Financial management: No change Handwriting: 100% legible, Pain/fatigues with writing note cards. Hobbies: Making gifts baskets; shopping ([employer] is a favorite)  MOBILITY STATUS: Uses a w/c  POSTURE COMMENTS:   Sitting balance:  WFL  ACTIVITY TOLERANCE: Activity tolerance: limited  FUNCTIONAL OUTCOME MEASURES: MAM-20: Sum score: 73/80; MAM Measure score: 71.8  UPPER EXTREMITY ROM:    Active ROM Right Eval WFL Left Eval [facility]  Shoulder flexion    Shoulder abduction    Shoulder adduction    Shoulder extension    Shoulder internal rotation    Shoulder external rotation    Elbow flexion    Elbow extension    Wrist flexion    Wrist extension    Wrist ulnar deviation    Wrist radial deviation    Wrist pronation    Wrist  supination    (Blank rows = not tested)  UPPER EXTREMITY MMT:     MMT Right eval Right 10/03/23 Left eval Left 10/03/23  Shoulder flexion 3+/5 3+/5 3+/5 3+/5  Shoulder abduction 3+/5 3+/5 3+/5 3+/5  Shoulder adduction      Shoulder extension      Shoulder internal rotation      Shoulder external rotation      Middle trapezius      Lower trapezius      Elbow flexion 3+/5 3+/5 3+/5 3+/5  Elbow extension 3+/5 3+/5 3+/5 3+/5  Wrist flexion      Wrist extension 3+/5 3+/5 3+/5 3+/5  Wrist ulnar deviation      Wrist radial deviation      Wrist pronation 3+/5 3+/5 3+/5 3+/5  Wrist supination 3+/5 3+/5 3+/5 3+/5  (Blank rows = not tested)  HAND FUNCTION: Eval: Grip strength: Right: 5 lbs; Left: 2 lbs and Lateral pinch: Right: 5 lbs, Left: 2 lbs, 3pt. Pinch: R: 5 lbs, L: 2 lbs; 2pt. Pinch: R: 1 lbs, L: 1lb  10/03/23: Grip strength: Right: 15 lbs; Left: 3 lbs  and Lateral pinch: Right: 4 lbs, Left: 2 lbs, 3pt. Pinch: R: 3 lbs, L: 2 lbs  COORDINATION:  COORDINATION:   Eval: 9 Hole Peg test: Right: 25 sec; Left: 31 sec  10/03/23:  9 Hole Peg test: Right: 34 sec; Left: 41 sec  SENSATION: WFL light touch, and proprioceptive awareness  EDEMA: N/A   COGNITION: Overall cognitive status: Within functional limits for tasks assessed  VISION: Subjective report: Reports blurriness  PERCEPTION: WFL  PRAXIS: WFL                                                                                                                           TREATMENT DATE: 10/10/2023   Therapeutic Activities:   -Pt. Performed 2 sets of translatory movements moving Jumbo Pegs from the palm to the tips of the thumb and the 2nd digit, in preparation for placing Jumbo Pegs into Pegboard.  -Pt. Performed 2 sets of Grip strength using 11.2# of force to grip Jumbo pegs from Jumbo Pegboard and discard pegs into container in high and low planes to promote sustained grip strength in elbow extension for functional  reaching.   -Pt. Performed 2 sets of BUE ulnar/radial deviation while grasping and twisting off 1 resistive cubes with thumb and 2nd digit to simulate movement of twisting off medication bottle caps, while the board is placed at a vertical angle to encourage wrist extension.  Therapeutic Ex.:  -Pt. Performed EZ Board exercises to target forearm supination/pronation, wrist flexion/extension using gross grasp, in frontal plane to promote shoulder flexion, wrist flexion, and extension while performing resistive wrist flexion and extension with a gross grip.    PATIENT EDUCATION: Education details: UE functioning, strengthening Person educated: Patient and Spouse Education method: Museum/gallery conservator cues,verbal cues Education comprehension: verbalized understanding and needs further education  HOME EXERCISE PROGRAM:  Continue ongoing assessment, and provide as indicated   GOALS: Goals reviewed with patient? Yes  SHORT TERM GOALS: Target date: 11/14/2023   Pt. Will be independent with HEPs for BUEs. Baseline: 10/03/23: Independent with HEPs; continue to provide, and upgrade as indicated Eval:No current HEP Goal status: Ongoing   LONG TERM GOALS: Target date: 12/26/2023  Pt. Will be improve BUE strength by 2 mm grades to be able to independently reach for ADL/IADL items in multiple planes. Baseline: 10/03/23: BUE strength: 3+/5 overall. Eval: BUE strength: 3+/5 overall  Goal status: Ongoing  2.  Pt. will improve bilateral grip strength to be able to securely hold items. Baseline: 10/03/23: R: 15# L: 3# Eval: R: 5#, L: 2# Goal status: Ongoing  3.  Pt. Will improve bilateral lateral pinch strength by 3# to be able to open medication bottles Baseline: 10/03/23: R: 4# L: 2# L: Eval: R: 5#, L: 2# Goal status: Ongoing  4.  Pt. Will improve bilateral 3pt. pinch strength to be able to independently use a nail clipper Baseline: 10/03/23: R: 3# L: 2# Eval: R: 3#, L: 2# Goal status:  Ongoing  5.  Pt. Will improve [facility] skills to be able to manipulate small objects. Baseline: 10/03/23: R: 34 sec., L: 41 sec. Eval: R: 25 sec., L: 31 sec. Goal status: Ongoing  ASSESSMENT:  CLINICAL IMPRESSION:  Pt. Reports that she feels very tired today due to lack of sleep last night. Pt. presents with fatigue in the LUE during functional reaching tasks today. Pt. Was able to tolerate grip strength at 11.2# of force during Jumbo Pegboard activity. Pt. Has difficulty with activity tolerance in functional reaching tasks while sustaining elbow extension in combination of FMC tasks due to LUE weakness. Pt. continues to benefit from OT services to work on improving BUE functioning in order to improve engagement in, and participation in ADL, and IADL functioning.   PERFORMANCE DEFICITS: in functional skills including ADLs, IADLs, coordination, dexterity, proprioception, ROM, strength, Fine motor control, Gross motor control, and UE functional use, cognitive skills including , and psychosocial skills including environmental adaptation, routines, and behaviors.   IMPAIRMENTS: are limiting patient from ADLs, IADLs, and leisure.   CO-MORBIDITIES: may have co-morbidities  that affects occupational performance. Patient will benefit from skilled OT to address above impairments and improve overall function.  MODIFICATION OR ASSISTANCE TO COMPLETE EVALUATION: Min-Moderate modification of tasks or assist with assess necessary to complete an evaluation.  OT OCCUPATIONAL PROFILE AND HISTORY: Detailed assessment: Review of records and additional review of physical, cognitive, psychosocial history related to current functional performance.  CLINICAL DECISION MAKING: Moderate - several treatment options, min-mod task modification necessary  REHAB POTENTIAL: Good  EVALUATION COMPLEXITY: Moderate    PLAN:  OT FREQUENCY: 2x/week  OT DURATION: 12 weeks  PLANNED INTERVENTIONS: 97168 OT Re-evaluation,  97535 self care/ADL training, 02889 therapeutic exercise, 97530 therapeutic activity, 97112 neuromuscular re-education, 97018 paraffin, 02965 contrast bath, 97760 Orthotics management and training, 02239 Splinting (initial encounter), functional mobility training, patient/family education, and DME and/or AE instructions  RECOMMENDED OTHER SERVICES: PT  CONSULTED AND AGREED WITH PLAN OF CARE: Patient and family member/caregiver  PLAN FOR NEXT SESSION: Treatment  Damien Nap, OTS   Richardson Otter, MS, OTR/L  10/10/2023, 3:31 PM

## 2023-10-11 NOTE — Therapy (Incomplete)
 OUTPATIENT PHYSICAL THERAPY NEURO TREATMENT   Patient Name: Christine Chavez MRN: [id number] DOB:17-Mar-1981, 43 y.o., female Today's Date: 10/11/2023   PCP: Johnie Perkins REFERRING PROVIDER: Maree Hila  END OF SESSION:          Past Medical History:  Diagnosis Date   Bladder retention    Complication of anesthesia    ? seizures after anesthesia    Depression    Diarrhea due to malabsorption    Dizziness    Headache    Migraines    Neurogenic bladder    Pyelonephritis    Renal disorder    Vision abnormalities    Weight loss    Past Surgical History:  Procedure Laterality Date   ANTERIOR CRUCIATE LIGAMENT REPAIR  1997   APPENDECTOMY     BLADDER REMOVAL     COLONOSCOPY WITH PROPOFOL  N/A 11/11/2018   Procedure: COLONOSCOPY WITH PROPOFOL ;  Surgeon: Gaylyn Gladis PENNER, MD;  Location: [facility] ENDOSCOPY;  Service: Endoscopy;  Laterality: N/A;   CYSTOSCOPY WITH STENT PLACEMENT Right 04/17/2016   Procedure: CYSTOSCOPY WITH STENT PLACEMENT;  Surgeon: Belvie LITTIE Clara, MD;  Location: [facility] ORS;  Service: Urology;  Laterality: Right;   ESOPHAGOGASTRODUODENOSCOPY (EGD) WITH PROPOFOL  N/A 11/11/2018   Procedure: ESOPHAGOGASTRODUODENOSCOPY (EGD) WITH PROPOFOL ;  Surgeon: Gaylyn Gladis PENNER, MD;  Location: [facility] ENDOSCOPY;  Service: Endoscopy;  Laterality: N/A;   EXPLORATORY LAPAROTOMY  1999   HIP SURGERY Right    KIDNEY STONE SURGERY  04/2016   REVISION UROSTOMY CUTANEOUS     REVISION UROSTOMY CUTANEOUS  01/10/2018   SUPRAPUBIC CATHETER PLACEMENT  08/2017   TONSILLECTOMY     Patient Active Problem List   Diagnosis Date Noted   Stroke-like symptoms 06/13/2023   Refractory nausea and vomiting 02/22/2023   Syncope and collapse 02/19/2023   Palliative care by specialist 02/19/2023   UTI (urinary tract infection) 02/13/2023   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   Neurogenic bladder 02/13/2023   Hepatic steatosis 02/13/2023   Wheelchair dependent 02/13/2023    Secondary adrenal insufficiency (HCC) 02/13/2023   Right flank pain 02/13/2023   Iron deficiency anemia 10/02/2022   Seizure (HCC) 11/11/2018   Major depressive disorder, recurrent episode, moderate (HCC) 02/07/2018   Nephrolithiasis 04/16/2016   Numbness 07/28/2015   Bladder retention 06/23/2015   Abdominal pain 06/04/2015   Dizziness 05/18/2015   Neck pain 05/18/2015   Complicated migraine 04/28/2015   Other fatigue 04/28/2015   Abnormal finding on MRI of brain 04/28/2015   Diarrhea 03/29/2015   H/O disease 03/29/2015   Abnormal weight loss 03/29/2015   Muscle weakness (generalized) 03/14/2015   Headache, migraine 03/10/2015    ONSET DATE: 06/13/23  REFERRING DIAG: CVA  THERAPY DIAG:  No diagnosis found.  Rationale for Evaluation and Treatment: Rehabilitation  SUBJECTIVE:  SUBJECTIVE STATEMENT:  ***   Pt was  accompanied by: significant other  ( not today)      PERTINENT HISTORY:  Patient is referred to PT s/p CVA.  Patient presents to PT s/p CVA on 06/13/23.  Pmh includes adrenal insufficiency, Hereditary spastic paraplegia (HSP), small fiber neuropathy c/b BLE paraplegia and neurogenic bladder s/p cystectomy w/ ileal condiuit (05/2022), chronic pain, migraine. Patient was recently hospitalized prior to this at [facility] from 04/30/23 to 05/27/23. Patient reports L sided weakness. Is having stabbing pains for a few minutes.   Patient presents to pelvic PT and provides this information:  Pt was sent to ED March 31 at [facility] for R abdominal pain below her stoma. Low blood flow to her ovary . Pt saw her gynecologist yesterday and they are going to try to a tap block to address the pain which she is caused by multifactorial issues.  Pt had hysterectomy in 2024 and fallopian tubes  and uterus , bladder and urethra were removed. Ovaries remained to prevent from early menopause.  Pt had kidney infections that led her be hospitalized for Jan 13- Feb 9  . During that time, pt learned she had endometrioma on the R ovary. Pt was placed on hormones for shrink the endometrioma. Pain has escalated with the hormone treatment and the endometrioma grew in size. Due to the   hormone Tx was not helping, they  will be replaced by Lupron once insurances approves. Pt is not wanting to take Lupron.              R abdominal pain below her stoma : Pain is intermittent. There is throbbing pain. The pain triggers nausea 4 x within the last 5 days.  Due to the nausea, pt has not had much of an appetite. Pt has daily BM but it is like IBS, sometimes loose and sometimes hard stools. Stool consistency 50% Type 6-7, 50%  Type 4-5    It has not escalated to the level that lead to the ED visit and the pain readiated to the pelvic area like labor pains but no baby .  Baseline 6-7/10 pain lately and is localized at the flank and groin not pelvic area. Near her stoma bag. When she removes her stoma bag, there is no pain. The pain was triggered with leaning back too far and lifting legs for an exercise. There is not rhyme or reason when the pain occurs. It also occurs when sleeping . It does not matter if she is on her back or side. 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  Pt has tried vaginal Valium  and it helped some but she found it has sedative effect and only uses when she is in desperate situation.  Pt feels the pain is less related to pelvic floor.    Pt have abdominal scars where 2 surgeries were performed.    Gynecologist referred her for Pelvic PT. Pt would like to achieve to 3/10 pain and be able to attend children's activities. Pt would like to have less pain when undergoing vaginal US  exam because they were very painful.    Decrease pain triggering nausea from 4 x  within the last 5 days to < 2 x with one week.  Decrease scar adhesion.   Stool consistency 50% Type 6-7, 50%  Type 4-5   PAIN:  Are you having pain? Has been having sharp stabbing pains, throbbing pain at the flank  and R groin area   PRECAUTIONS: Fall  RED FLAGS: None and hx of bladder removal     WEIGHT BEARING RESTRICTIONS: No  FALLS: Has patient fallen in last 6 months? Yes. Number of falls 3  LIVING ENVIRONMENT: Lives with: lives with their family and lives with their spouse Lives in: House/apartment Stairs: ramp Has following equipment at home: Wheelchair (manual) and Ramped entry  PLOF: Independent with household mobility with device  PATIENT GOALS: to get stronger, do ADLs better.   OBJECTIVE:  Note: Objective measures were completed at Evaluation unless otherwise noted.  DIAGNOSTIC FINDINGS:  MRI results received from [facility] 1. 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 2. Adjacent small acute infarct within the posterior right frontal lobe white matter. 3. Background moderate multifocal T2 FLAIR hyperintense signal abnormality within the cerebral white matter (with a subcortical white matter predominance), greater than expected for age. These signal changes are nonspecific and differential considerations include accelerated chronic small vessel ischemic disease, sequelae of chronic migraine headaches, sequelae of vasculitis/hypercoagulable state and sequelae of a prior infectious/inflammatory process, among others.   MRA head:   Significantly motion degraded examination. No proximal intracranial large vessel occlusion is identified. Motion artifact precludes accurate assessment for stenoses and for vessel irregularity.    COGNITION: Overall cognitive status: Within functional limits for tasks assessed   SENSATION: Light touch: Impaired ; below R knee has decreased  sensation   COORDINATION: Heel shin: tonic shaking movements    MUSCLE TONE: LLE: Moderate RLE Moderate   POSTURE: forward head, increased thoracic kyphosis, posterior pelvic tilt, and flexed trunk     LOWER EXTREMITY MMT:    MMT Right Eval Left Eval  Hip flexion 6.2 5.6  Hip extension    Hip abduction 6.5 6.4  Hip adduction 3.4 3.4  Hip internal rotation    Hip external rotation    Knee flexion 5.1 1.0  Knee extension 7.1 3.2  Ankle dorsiflexion    Ankle plantarflexion    Ankle inversion    Ankle eversion    (Blank rows = not tested)  BED MOBILITY:  Will assess in future session.  TRANSFERS: Assistive device utilized: Environmental consultant - 4 wheeled  Sit to stand: CGA Stand to sit: CGA Chair to chair: Min A     GAIT: Gait pattern: step to pattern, knee flexed in stance- Right, knee flexed in stance- Left, ataxic, trunk flexed, poor foot clearance- Right, and poor foot clearance- Left Distance walked: 8 ft  Assistive device utilized: Walker - 4 wheeled Level of assistance: CGA and Min A Comments: Jerking tonic  gait pattern   FUNCTIONAL TESTS:  5 times sit to stand: 46.33 with hands  10 meter walk test: 1.04 for 8 ft; unable to finish  PATIENT SURVEYS:  Stroke Impact Scale give next session 3  TREATMENT DATE: 10/11/23     TherAct:  In // bars:  -forward/backwards ambulation in // bars 6x -lateral step 6x length of // bars -mini squats in // bars 10x  TherEx  GTB abduction 15x GTB  march 15x GTB hamstring curl 10x each LE   Neuro RE-ed: Standing with CGA next to support surface:  Airex pad: static stand 30 seconds x 2 trials, noticeable trembling of ankles/LE's with fatigue and challenge to maintain stability Airex pad: horizontal head turns  scanning room 10x ; cueing for arc of motion  Airex pad: vertical head turns 30 seconds,  cueing for arc of motion, noticeable sway with upward gaze increasing demand on ankle righting reaction musculature     PATIENT EDUCATION: Education details: goals, POC  Person educated: Patient Education method: Explanation, Demonstration, Tactile cues, and Verbal cues Education comprehension: verbalized understanding, returned demonstration, verbal cues required, and tactile cues required  HOME EXERCISE PROGRAM: See pt instructions  GOALS: Goals reviewed with patient? Yes  SHORT TERM GOALS: Target date: 07/23/2023    Patient will be independent in home exercise program to improve strength/mobility for better functional independence with ADLs. Baseline: 3/10: give next session Goal status: Ongoing    LONG TERM GOALS: Target date: 10/24/2023    Patient (< 75 years old) will complete five times sit to stand test in < 10 seconds indicating an increased LE strength and improved balance. Baseline: 3/10: 46.33 Goal status: Ongoing  2.  Patient will perform 10 MWT under 2 minutes with no rest breaks indicating improved functional ambulation. Baseline: 3/10: 8 ft in 1 min 4 seconds Goal status: Ongoing  3.  Patient will tolerate standing 3 minutes for ADL performance Baseline: 6/89: knee buckling in standing Goal status: Ongoing  4.  Patient will increase BLE gross strength to 4+/5 and/or within 1lb of each limb  as to improve functional strength for independent gait, increased standing tolerance and increased ADL ability. Baseline: 3/10: see above  Goal status: Ongoing               5. Pt will demo increased ab scar mobility to achieve more relaxation of low abdomen and pelvic floor in order to experience <3/10 pain and be able to attend children's activities and  when undergoing vaginal US  exam because they were very painful.  Baseline:Gynecologist referred her for Pelvic PT. Pt would like to achieve to 3/10 pain and be able to attend children's activities. Pt would like to  have less pain when undergoing vaginal US  exam because they were very painful.  Goal status  ONgoing             6.  Decrease pain triggering nausea from 4 x within the last 5 days to < 2 x with one week.                        Baseline:  Decrease pain triggering nausea from 4 x within the last 5 days to < 2 x with one week.     Goal status: ongoing   7. Pt will demo improved stool consistency Type 4-5 to  >75 % in order to minimize ab pain, promote GI , bowel and pelvic floor functio           Baseline:  Stool consistency 50% Type 6-7, 50%  Type 4-5            Goal status: Ongoing   ASSESSMENT:  CLINICAL IMPRESSION: ***  Patient will benefit from skilled physical therapy to increase functional mobility, strength, and independence with ADLs.     OBJECTIVE IMPAIRMENTS: Abnormal gait, decreased activity tolerance, decreased balance, decreased coordination, decreased endurance, decreased mobility, difficulty walking, decreased strength, dizziness, impaired perceived functional ability, increased muscle spasms, impaired flexibility, improper body mechanics, and postural dysfunction.   ACTIVITY LIMITATIONS: carrying, lifting, bending, sitting, standing, squatting, sleeping, stairs, transfers, bed mobility, bathing, toileting, dressing, reach over head, hygiene/grooming, locomotion level, and caring for others  PARTICIPATION LIMITATIONS: meal prep, cleaning, laundry, interpersonal relationship, driving, shopping, community activity, yard work, and church  PERSONAL FACTORS: Age, Past/current experiences, Time since onset of injury/illness/exacerbation, Transportation, and 3+ comorbidities: adrenal insufficiency, Hereditary spastic paraplegia (HSP), small fiber neuropathy c/b BLE paraplegia and neurogenic bladder s/p cystectomy w/ ileal condiuit (05/2022), chronic pain, migraine are also affecting patient's functional outcome.   REHAB POTENTIAL: Good  CLINICAL DECISION MAKING: Evolving/moderate  complexity  EVALUATION COMPLEXITY: Moderate  PLAN:  PT FREQUENCY: 2x/week  PT DURATION: 12 weeks  PLANNED INTERVENTIONS: 97164- PT Re-evaluation, 97110-Therapeutic exercises, 97530- Therapeutic activity, 97112- Neuromuscular re-education, 97535- Self Care, [id number]- Manual therapy, [id number]- Gait training, [phone]- Orthotic Fit/training, [phone]- Canalith repositioning, [id number]- Splinting, [id number]- Electrical stimulation (unattended), [phone]- Electrical stimulation (manual), [id number]- Vasopneumatic device, [id number]- Ultrasound, [id number]- Traction (mechanical), [id number]- Ionotophoresis 4mg /ml Dexamethasone , Patient/Family education, Balance training, Stair training, Taping, Dry Needling, Joint mobilization, Joint manipulation, Spinal manipulation, Spinal mobilization, Scar mobilization, Compression bandaging, Vestibular training, Visual/preceptual remediation/compensation, DME instructions, Wheelchair mobility training, Cryotherapy, Moist heat, and Biofeedback  PLAN FOR NEXT SESSION: Address R side of abdomen fascial restrictions    Leaira Fullam, PT 10/11/2023, 11:26 AM

## 2023-10-15 ENCOUNTER — Ambulatory Visit: Payer: BC Managed Care – PPO

## 2023-10-15 ENCOUNTER — Ambulatory Visit: Admitting: Occupational Therapy

## 2023-10-15 NOTE — Therapy (Incomplete)
 OUTPATIENT PHYSICAL THERAPY NEURO TREATMENT   Patient Name: Christine Chavez MRN: [id number] DOB:[birth year], 43 y.o., female Today's Date: 10/15/2023   PCP: Johnie Perkins REFERRING PROVIDER: Maree Hila  END OF SESSION:          Past Medical History:  Diagnosis Date   Bladder retention    Complication of anesthesia    ? seizures after anesthesia    Depression    Diarrhea due to malabsorption    Dizziness    Headache    Migraines    Neurogenic bladder    Pyelonephritis    Renal disorder    Vision abnormalities    Weight loss    Past Surgical History:  Procedure Laterality Date   ANTERIOR CRUCIATE LIGAMENT REPAIR  1997   APPENDECTOMY     BLADDER REMOVAL     COLONOSCOPY WITH PROPOFOL  N/A 11/11/2018   Procedure: COLONOSCOPY WITH PROPOFOL ;  Surgeon: Gaylyn Gladis PENNER, MD;  Location: [facility] ENDOSCOPY;  Service: Endoscopy;  Laterality: N/A;   CYSTOSCOPY WITH STENT PLACEMENT Right 04/17/2016   Procedure: CYSTOSCOPY WITH STENT PLACEMENT;  Surgeon: Belvie LITTIE Clara, MD;  Location: [facility] ORS;  Service: Urology;  Laterality: Right;   ESOPHAGOGASTRODUODENOSCOPY (EGD) WITH PROPOFOL  N/A 11/11/2018   Procedure: ESOPHAGOGASTRODUODENOSCOPY (EGD) WITH PROPOFOL ;  Surgeon: Gaylyn Gladis PENNER, MD;  Location: [facility] ENDOSCOPY;  Service: Endoscopy;  Laterality: N/A;   EXPLORATORY LAPAROTOMY  1999   HIP SURGERY Right    KIDNEY STONE SURGERY  04/2016   REVISION UROSTOMY CUTANEOUS     REVISION UROSTOMY CUTANEOUS  01/10/2018   SUPRAPUBIC CATHETER PLACEMENT  08/2017   TONSILLECTOMY     Patient Active Problem List   Diagnosis Date Noted   Stroke-like symptoms 06/13/2023   Refractory nausea and vomiting 02/22/2023   Syncope and collapse 02/19/2023   Palliative care by specialist 02/19/2023   UTI (urinary tract infection) 02/13/2023   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   Neurogenic bladder 02/13/2023   Hepatic steatosis 02/13/2023   Wheelchair dependent 02/13/2023    Secondary adrenal insufficiency (HCC) 02/13/2023   Right flank pain 02/13/2023   Iron deficiency anemia 10/02/2022   Seizure (HCC) 11/11/2018   Major depressive disorder, recurrent episode, moderate (HCC) 02/07/2018   Nephrolithiasis 04/16/2016   Numbness 07/28/2015   Bladder retention 06/23/2015   Abdominal pain 06/04/2015   Dizziness 05/18/2015   Neck pain 05/18/2015   Complicated migraine 04/28/2015   Other fatigue 04/28/2015   Abnormal finding on MRI of brain 04/28/2015   Diarrhea 03/29/2015   H/O disease 03/29/2015   Abnormal weight loss 03/29/2015   Muscle weakness (generalized) 03/14/2015   Headache, migraine 03/10/2015    ONSET DATE: 06/13/23  REFERRING DIAG: CVA  THERAPY DIAG:  No diagnosis found.  Rationale for Evaluation and Treatment: Rehabilitation  SUBJECTIVE:  SUBJECTIVE STATEMENT:  ***   Pt was  accompanied by: significant other  ( not today)      PERTINENT HISTORY:  Patient is referred to PT s/p CVA.  Patient presents to PT s/p CVA on 06/13/23.  Pmh includes adrenal insufficiency, Hereditary spastic paraplegia (HSP), small fiber neuropathy c/b BLE paraplegia and neurogenic bladder s/p cystectomy w/ ileal condiuit (05/2022), chronic pain, migraine. Patient was recently hospitalized prior to this at [facility] from 04/30/23 to 05/27/23. Patient reports L sided weakness. Is having stabbing pains for a few minutes.   Patient presents to pelvic PT and provides this information:  Pt was sent to ED March 31 at [facility] for R abdominal pain below her stoma. Low blood flow to her ovary . Pt saw her gynecologist yesterday and they are going to try to a tap block to address the pain which she is caused by multifactorial issues.  Pt had hysterectomy in 2024 and fallopian tubes  and uterus , bladder and urethra were removed. Ovaries remained to prevent from early menopause.  Pt had kidney infections that led her be hospitalized for Jan 13- Feb 9  . During that time, pt learned she had endometrioma on the R ovary. Pt was placed on hormones for shrink the endometrioma. Pain has escalated with the hormone treatment and the endometrioma grew in size. Due to the   hormone Tx was not helping, they  will be replaced by Lupron once insurances approves. Pt is not wanting to take Lupron.              R abdominal pain below her stoma : Pain is intermittent. There is throbbing pain. The pain triggers nausea 4 x within the last 5 days.  Due to the nausea, pt has not had much of an appetite. Pt has daily BM but it is like IBS, sometimes loose and sometimes hard stools. Stool consistency 50% Type 6-7, 50%  Type 4-5    It has not escalated to the level that lead to the ED visit and the pain readiated to the pelvic area like labor pains but no baby .  Baseline 6-7/10 pain lately and is localized at the flank and groin not pelvic area. Near her stoma bag. When she removes her stoma bag, there is no pain. The pain was triggered with leaning back too far and lifting legs for an exercise. There is not rhyme or reason when the pain occurs. It also occurs when sleeping . It does not matter if she is on her back or side. 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  Pt has tried vaginal Valium  and it helped some but she found it has sedative effect and only uses when she is in desperate situation.  Pt feels the pain is less related to pelvic floor.    Pt have abdominal scars where 2 surgeries were performed.    Gynecologist referred her for Pelvic PT. Pt would like to achieve to 3/10 pain and be able to attend children's activities. Pt would like to have less pain when undergoing vaginal US  exam because they were very painful.    Decrease pain triggering nausea from 4 x  within the last 5 days to < 2 x with one week.  Decrease scar adhesion.   Stool consistency 50% Type 6-7, 50%  Type 4-5   PAIN:  Are you having pain? Has been having sharp stabbing pains, throbbing pain at the flank  and R groin area   PRECAUTIONS: Fall  RED FLAGS: None and hx of bladder removal     WEIGHT BEARING RESTRICTIONS: No  FALLS: Has patient fallen in last 6 months? Yes. Number of falls 3  LIVING ENVIRONMENT: Lives with: lives with their family and lives with their spouse Lives in: House/apartment Stairs: ramp Has following equipment at home: Wheelchair (manual) and Ramped entry  PLOF: Independent with household mobility with device  PATIENT GOALS: to get stronger, do ADLs better.   OBJECTIVE:  Note: Objective measures were completed at Evaluation unless otherwise noted.  DIAGNOSTIC FINDINGS:  MRI results received from [facility] 1. 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 2. Adjacent small acute infarct within the posterior right frontal lobe white matter. 3. Background moderate multifocal T2 FLAIR hyperintense signal abnormality within the cerebral white matter (with a subcortical white matter predominance), greater than expected for age. These signal changes are nonspecific and differential considerations include accelerated chronic small vessel ischemic disease, sequelae of chronic migraine headaches, sequelae of vasculitis/hypercoagulable state and sequelae of a prior infectious/inflammatory process, among others.   MRA head:   Significantly motion degraded examination. No proximal intracranial large vessel occlusion is identified. Motion artifact precludes accurate assessment for stenoses and for vessel irregularity.    COGNITION: Overall cognitive status: Within functional limits for tasks assessed   SENSATION: Light touch: Impaired ; below R knee has decreased  sensation   COORDINATION: Heel shin: tonic shaking movements    MUSCLE TONE: LLE: Moderate RLE Moderate   POSTURE: forward head, increased thoracic kyphosis, posterior pelvic tilt, and flexed trunk     LOWER EXTREMITY MMT:    MMT Right Eval Left Eval  Hip flexion 6.2 5.6  Hip extension    Hip abduction 6.5 6.4  Hip adduction 3.4 3.4  Hip internal rotation    Hip external rotation    Knee flexion 5.1 1.0  Knee extension 7.1 3.2  Ankle dorsiflexion    Ankle plantarflexion    Ankle inversion    Ankle eversion    (Blank rows = not tested)  BED MOBILITY:  Will assess in future session.  TRANSFERS: Assistive device utilized: Environmental consultant - 4 wheeled  Sit to stand: CGA Stand to sit: CGA Chair to chair: Min A     GAIT: Gait pattern: step to pattern, knee flexed in stance- Right, knee flexed in stance- Left, ataxic, trunk flexed, poor foot clearance- Right, and poor foot clearance- Left Distance walked: 8 ft  Assistive device utilized: Walker - 4 wheeled Level of assistance: CGA and Min A Comments: Jerking tonic  gait pattern   FUNCTIONAL TESTS:  5 times sit to stand: 46.33 with hands  10 meter walk test: 1.04 for 8 ft; unable to finish  PATIENT SURVEYS:  Stroke Impact Scale give next session 3  TREATMENT DATE: 10/15/23     TherAct:  In // bars:  -forward/backwards ambulation in // bars 6x -lateral step 6x length of // bars -mini squats in // bars 10x  TherEx  GTB abduction 15x GTB  march 15x GTB hamstring curl 10x each LE   Neuro RE-ed: Standing with CGA next to support surface:  Airex pad: static stand 30 seconds x 2 trials, noticeable trembling of ankles/LE's with fatigue and challenge to maintain stability Airex pad: horizontal head turns  scanning room 10x ; cueing for arc of motion  Airex pad: vertical head turns 30 seconds,  cueing for arc of motion, noticeable sway with upward gaze increasing demand on ankle righting reaction musculature     PATIENT EDUCATION: Education details: goals, POC  Person educated: Patient Education method: Explanation, Demonstration, Tactile cues, and Verbal cues Education comprehension: verbalized understanding, returned demonstration, verbal cues required, and tactile cues required  HOME EXERCISE PROGRAM: See pt instructions  GOALS: Goals reviewed with patient? Yes  SHORT TERM GOALS: Target date: 07/23/2023    Patient will be independent in home exercise program to improve strength/mobility for better functional independence with ADLs. Baseline: 3/10: give next session Goal status: Ongoing    LONG TERM GOALS: Target date: 10/24/2023    Patient (< 28 years old) will complete five times sit to stand test in < 10 seconds indicating an increased LE strength and improved balance. Baseline: 3/10: 46.33 Goal status: Ongoing  2.  Patient will perform 10 MWT under 2 minutes with no rest breaks indicating improved functional ambulation. Baseline: 3/10: 8 ft in 1 min 4 seconds Goal status: Ongoing  3.  Patient will tolerate standing 3 minutes for ADL performance Baseline: 6/89: knee buckling in standing Goal status: Ongoing  4.  Patient will increase BLE gross strength to 4+/5 and/or within 1lb of each limb  as to improve functional strength for independent gait, increased standing tolerance and increased ADL ability. Baseline: 3/10: see above  Goal status: Ongoing               5. Pt will demo increased ab scar mobility to achieve more relaxation of low abdomen and pelvic floor in order to experience <3/10 pain and be able to attend children's activities and  when undergoing vaginal US  exam because they were very painful.  Baseline:Gynecologist referred her for Pelvic PT. Pt would like to achieve to 3/10 pain and be able to attend children's activities. Pt would like to  have less pain when undergoing vaginal US  exam because they were very painful.  Goal status  ONgoing             6.  Decrease pain triggering nausea from 4 x within the last 5 days to < 2 x with one week.                        Baseline:  Decrease pain triggering nausea from 4 x within the last 5 days to < 2 x with one week.     Goal status: ongoing   7. Pt will demo improved stool consistency Type 4-5 to  >75 % in order to minimize ab pain, promote GI , bowel and pelvic floor functio           Baseline:  Stool consistency 50% Type 6-7, 50%  Type 4-5            Goal status: Ongoing   ASSESSMENT:  CLINICAL IMPRESSION: ***  Patient will benefit from skilled physical therapy to increase functional mobility, strength, and independence with ADLs.     OBJECTIVE IMPAIRMENTS: Abnormal gait, decreased activity tolerance, decreased balance, decreased coordination, decreased endurance, decreased mobility, difficulty walking, decreased strength, dizziness, impaired perceived functional ability, increased muscle spasms, impaired flexibility, improper body mechanics, and postural dysfunction.   ACTIVITY LIMITATIONS: carrying, lifting, bending, sitting, standing, squatting, sleeping, stairs, transfers, bed mobility, bathing, toileting, dressing, reach over head, hygiene/grooming, locomotion level, and caring for others  PARTICIPATION LIMITATIONS: meal prep, cleaning, laundry, interpersonal relationship, driving, shopping, community activity, yard work, and church  PERSONAL FACTORS: Age, Past/current experiences, Time since onset of injury/illness/exacerbation, Transportation, and 3+ comorbidities: adrenal insufficiency, Hereditary spastic paraplegia (HSP), small fiber neuropathy c/b BLE paraplegia and neurogenic bladder s/p cystectomy w/ ileal condiuit (05/2022), chronic pain, migraine are also affecting patient's functional outcome.   REHAB POTENTIAL: Good  CLINICAL DECISION MAKING: Evolving/moderate  complexity  EVALUATION COMPLEXITY: Moderate  PLAN:  PT FREQUENCY: 2x/week  PT DURATION: 12 weeks  PLANNED INTERVENTIONS: [id number]- PT Re-evaluation, 97110-Therapeutic exercises, 97530- Therapeutic activity, 97112- Neuromuscular re-education, 97535- Self Care, [id number]- Manual therapy, [id number]- Gait training, [phone]- Orthotic Fit/training, [phone]- Canalith repositioning, [id number]- Splinting, [id number]- Electrical stimulation (unattended), [phone]- Electrical stimulation (manual), [id number]- Vasopneumatic device, [id number]- Ultrasound, [id number]- Traction (mechanical), [id number]- Ionotophoresis 4mg /ml Dexamethasone , Patient/Family education, Balance training, Stair training, Taping, Dry Needling, Joint mobilization, Joint manipulation, Spinal manipulation, Spinal mobilization, Scar mobilization, Compression bandaging, Vestibular training, Visual/preceptual remediation/compensation, DME instructions, Wheelchair mobility training, Cryotherapy, Moist heat, and Biofeedback  PLAN FOR NEXT SESSION: Address R side of abdomen fascial restrictions    Cleota Pellerito, PT 10/15/2023, 1:16 PM

## 2023-10-16 ENCOUNTER — Ambulatory Visit
Admission: RE | Admit: 2023-10-16 | Discharge: 2023-10-16 | Disposition: A | Discharge status: Home / Self Care | Source: Ambulatory Visit | Attending: Family Medicine | Admitting: Family Medicine

## 2023-10-16 ENCOUNTER — Other Ambulatory Visit: Payer: Self-pay | Admitting: Family Medicine

## 2023-10-16 DIAGNOSIS — R531 Weakness: Secondary | ICD-10-CM | POA: Diagnosis present

## 2023-10-17 ENCOUNTER — Ambulatory Visit: Payer: BC Managed Care – PPO

## 2023-10-17 ENCOUNTER — Ambulatory Visit: Admitting: Occupational Therapy

## 2023-10-22 ENCOUNTER — Ambulatory Visit: Payer: BC Managed Care – PPO

## 2023-10-22 ENCOUNTER — Ambulatory Visit: Admitting: Occupational Therapy

## 2023-10-24 ENCOUNTER — Encounter: Admitting: Occupational Therapy

## 2023-10-24 ENCOUNTER — Ambulatory Visit: Payer: BC Managed Care – PPO

## 2023-10-25 NOTE — Progress Notes (Signed)
 General Medicine Daily Progress Note  Patient ID: Christine Chavez is a 43 y.o. female who is currently Hospital Day: 2 with principal problem of Complicated UTI (urinary tract infection)   Interval History: - no acute events overnight  Pt reports continued pain in bilateral flanks, worse on the left side. She reports taking antibiotics at home for her UTI, but is unsure if the medications were successful due to her intermittent vomiting.   Physical Exam:  Current Vital Signs 24h Vital Sign Ranges  T 36.7 C (98.1 F) (10/25/23 0758) Temp  Avg: 37.1 C (98.8 F)  Min: 36.4 C (97.5 F)  Max: 38 C (100.4 F)  BP 111/83 (10/25/23 0758) BP  Min: 111/83  Max: 140/112  HR 99 (10/25/23 0758) Pulse  Avg: 100.4  Min: 79  Max: 117  RR 18 (10/25/23 0758) Resp  Avg: 17.6  Min: 12  Max: 22  O2sat 97 %   SpO2  Avg: 96.6 %  Min: 95 %  Max: 98 %       Current Shift Ins/Outs 24h Total Ins/Outs  Time Ins Outs 07/10 0701 - 07/10 1900 In: -  Out: 675 [Urine:675] 07/09 0701 - 07/10 0700 In: -  Out: 650 [Urine:650]       Weights   First 66.2 kg (146 lb) (10/25/23 0758)   Last 66.2 kg (146 lb) (10/25/23 0758)    GEN: alert, pleasant, in NAD HEENT: MMM LUNGS: normal work of breathing, CTAB CV: tachycardic ABD: Soft, NT/ND, normoactive BS, ileocecal conduit pink and intact EXT: Warm, well-perfused, no edema SKIN: No rash or lesions appreciated NEURO: Face symmetric, intermittent tremors on exam, weak voice  Selected Labs and Data: Recent Labs  Lab 10/23/23 1241 10/24/23 2145 10/25/23 0047 10/25/23 0553  NA 140 140   < > 138  K 4.4 3.7  --  3.6  CL 108 111*  --  110*  CO2 22 16*  --  17*  BUN 9 9  --  8  CREATININE 0.8 1.0  --  1.0  GLUCOSE 82 95  --  116  CALCIUM  9.8 9.2  --  8.9   < > = values in this interval not displayed.   Recent Labs  Lab 10/23/23 1241 10/24/23 2145 10/25/23 0553  AST 18 24 21   ALT 24 23 23   ALKPHOS 53 59 55  TBILI 1.4 2.2* 2.2*     Recent Labs  Lab  10/23/23 1241 10/24/23 2145 10/25/23 0553  WBC 9.3 10.1* 10.3*  HGB 14.0 13.8 12.8  HCT 42.1 40.9 38.1  PLT 344 332 348   Recent Labs  Lab 10/24/23 2145  APTT 29.4  INR 1.1        Recent Results (from the past 24 hours)  POC GLUCOSE   Collection Time: 10/24/23  7:16 PM  Result Value Ref Range   POC Glucose 108 70 - 140 mg/dL  ECG 87-ozji   Collection Time: 10/24/23  7:33 PM  Result Value Ref Range   Vent Rate (bpm) 137    PR Interval (msec) 134    QRS Interval (msec) 64    QT Interval (msec) 278    QTc (msec) 419   Comprehensive Metabolic Panel (CMP)   Collection Time: 10/24/23  9:45 PM  Result Value Ref Range   Sodium 140 135 - 145 mmol/L   Potassium 3.7 3.5 - 5.0 mmol/L   Chloride 111 (H) 98 - 108 mmol/L   Carbon Dioxide (CO2) 16 (L) 21 -  30 mmol/L   Urea Nitrogen (BUN) 9 7 - 20 mg/dL   Creatinine 1.0 0.4 - 1.0 mg/dL   Glucose 95 70 - 859 mg/dL   Calcium  9.2 8.7 - 10.2 mg/dL   AST (Aspartate Aminotransferase) 24 15 - 41 U/L   ALT (Alanine Aminotransferase) 23 10 - 39 U/L   Bilirubin, Total 2.2 (H) 0.4 - 1.5 mg/dL   Alk Phos (Alkaline Phosphatase) 59 24 - 110 U/L   Albumin 3.8 3.5 - 4.8 g/dL   Protein, Total 7.0 6.2 - 8.1 g/dL   Anion Gap 13 (H) 3 - 12 mmol/L   BUN/CREA Ratio 9 6 - 27   Glomerular Filtration Rate (eGFR)  71 mL/min/1.73sq m  Complete Blood Count (CBC) with Differential   Collection Time: 10/24/23  9:45 PM  Result Value Ref Range   WBC (White Blood Cell Count) 10.1 (H) 3.2 - 9.8 x10^9/L   Hemoglobin 13.8 11.7 - 15.5 g/dL   Hematocrit 59.0 64.9 - 45.0 %   Platelets 332 150 - 450 x10^9/L   MCV (Mean Corpuscular Volume) 90 80 - 98 fL   MCH (Mean Corpuscular Hemoglobin) 30.3 26.5 - 34.0 pg   MCHC (Mean Corpuscular Hemoglobin Concentration) 33.7 31.0 - 36.0 %   RBC (Red Blood Cell Count) 4.56 3.77 - 5.16 x10^12/L   RDW-CV (Red Cell Distribution Width) 13.3 11.5 - 14.5 %   NRBC (Nucleated Red Blood Cell Count) 0.00 0 x10^9/L   NRBC % (Nucleated  Red Blood Cell %) 0.0 %   MPV (Mean Platelet Volume) 10.6 7.2 - 11.7 fL   Neutrophil Count 6.1 2.0 - 8.6 x10^9/L   Neutrophil % 59.9 37 - 80 %   Lymphocyte Count 3.1 0.6 - 4.2 x10^9/L   Lymphocyte % 30.3 10 - 50 %   Monocyte Count 0.9 0 - 0.9 x10^9/L   Monocyte % 8.7 0 - 12 %   Eosinophil Count 0.01 0 - 0.70 x10^9/L   Eosinophil % 0.1 0 - 7 %   Basophil Count 0.07 0 - 0.20 x10^9/L   Basophil % 0.7 0 - 2 %   Immature Granulocyte Count 0.03 <=0.06 x10^9/L   Immature Granulocyte % 0.3 <=0.7 %  Prothrombin Time (INR)   Collection Time: 10/24/23  9:45 PM  Result Value Ref Range   Prothrombin Time 12.2 9.5 - 13.1 sec   Prothrombin INR 1.1 0.9 - 1.1  Activated Partial Thromboplastin Time (APTT)   Collection Time: 10/24/23  9:45 PM  Result Value Ref Range   Act Partial Thromboplastin Time 29.4 26.8 - 37.1 sec  Lactate, Plasma (Lactic Acid) Venous   Collection Time: 10/24/23  9:45 PM  Result Value Ref Range   Lactate, Blood 1.5 0.6 - 2.2 mmol/L  Culture, Urine, Routine with Pyuria Screen (reflexed from Urinalysis)   Collection Time: 10/24/23 11:48 PM  Result Value Ref Range   See Comment     Color Light Yellow Colorless, Straw, Light Yellow, Yellow, Dark Yellow   Clarity Cloudy (!) Clear   Specific Gravity 1.019 1.005 - 1.030   pH, Urine 6.0 5.0 - 8.0   Protein, Urinalysis Trace (!) Negative   Glucose, Urinalysis Negative Negative   Ketones, Urinalysis Trace (!) Negative   Blood, Urinalysis 3+ (!) Negative   Nitrite, Urinalysis Negative Negative   Leukocytes, Urinalysis 1+ (!) Negative   Bilirubin, Urinalysis Negative Negative   Urobilinogen, Urinalysis 0.2 0.2 - 1.0 mg/dL   Red Blood Cells, Urinalysis 30 (H) <=3 /hpf   WBC, UA  116 (H) <=5 /hpf   Squamous Epithelial Cells, Urinalysis 0 /hpf   Hyaline Casts 0 /lpf  Shock Panel, Venous   Collection Time: 10/25/23 12:47 AM  Result Value Ref Range   Patient Temperature, Venous 37.0 C   FIO2, Venous     pH, Venous 7.35 7.32 -  7.42   PCO2, Venous 31 (L) 39 - 55 mmHg   PO2, Venous 67 (H) 30 - 55 mmHg   Base Excess, Venous -7 (L) -3 - 3 mmol/L   Bicarbonate, Venous 17 (L) 20 - 28 mmol/L   CO2 TOTAL, VENOUS 18 (L) 21 - 30 mmol/L   Hemoglobin, Venous 12.9 11.7 - 15.5 g/dL   Hematocrit, Venous 60.9 35.0 - 45.0 %   % O2 Hemoglobin, Venous 92.0 (H) 60.0 - 85.0 %   % CO Hemoglobin, Venous 1.5 <=2.0 %   % Methemoglobin, Venous 0.6 0.4 - 1.5 %   Volume % O2, Venous 16.7 7.0 - 18.0 %   Sodium, Venous 139 135 - 145 mmol/L   Potassium, Venous 5.0 (H) 3.2 - 4.8 mmol/L   Glucose, Nonfasting, Venous 94 70 - 140 mg/dL   Calcium , Ionized 1.19 1.15 - 1.32 mmol/L   Lactate, Venous 3.4 (H) 0.6 - 2.2 mmol/L  Prolactin   Collection Time: 10/25/23 12:47 AM  Result Value Ref Range   Prolactin 16.11 ng/mL  Lactate, Repeat   Collection Time: 10/25/23  2:19 AM  Result Value Ref Range   Lactate, Blood 1.2 0.6 - 2.2 mmol/L  POC GLUCOSE   Collection Time: 10/25/23  5:40 AM  Result Value Ref Range   POC Glucose 132 70 - 140 mg/dL  Complete Blood Count (CBC)   Collection Time: 10/25/23  5:53 AM  Result Value Ref Range   WBC (White Blood Cell Count) 10.3 (H) 3.2 - 9.8 x10^9/L   Hemoglobin 12.8 11.7 - 15.5 g/dL   Hematocrit 61.8 64.9 - 45.0 %   Platelets 348 150 - 450 x10^9/L   MCV (Mean Corpuscular Volume) 91 80 - 98 fL   MCH (Mean Corpuscular Hemoglobin) 30.5 26.5 - 34.0 pg   MCHC (Mean Corpuscular Hemoglobin Concentration) 33.6 31.0 - 36.0 %   RBC (Red Blood Cell Count) 4.20 3.77 - 5.16 x10^12/L   RDW-CV (Red Cell Distribution Width) 13.6 11.5 - 14.5 %   NRBC (Nucleated Red Blood Cell Count) 0.00 0 x10^9/L   NRBC % (Nucleated Red Blood Cell %) 0.0 %   MPV (Mean Platelet Volume) 10.9 7.2 - 11.7 fL  Comprehensive Metabolic Panel (CMP)   Collection Time: 10/25/23  5:53 AM  Result Value Ref Range   Sodium 138 135 - 145 mmol/L   Potassium 3.6 3.5 - 5.0 mmol/L   Chloride 110 (H) 98 - 108 mmol/L   Carbon Dioxide (CO2) 17 (L) 21  - 30 mmol/L   Urea Nitrogen (BUN) 8 7 - 20 mg/dL   Creatinine 1.0 0.4 - 1.0 mg/dL   Glucose 883 70 - 859 mg/dL   Calcium  8.9 8.7 - 10.2 mg/dL   AST (Aspartate Aminotransferase) 21 15 - 41 U/L   ALT (Alanine Aminotransferase) 23 10 - 39 U/L   Bilirubin, Total 2.2 (H) 0.4 - 1.5 mg/dL   Alk Phos (Alkaline Phosphatase) 55 24 - 110 U/L   Albumin 3.6 3.5 - 4.8 g/dL   Protein, Total 6.7 6.2 - 8.1 g/dL   Anion Gap 11 3 - 12 mmol/L   BUN/CREA Ratio 8 6 - 27   Glomerular Filtration Rate (  eGFR)  71 mL/min/1.73sq m  Magnesium   Collection Time: 10/25/23  5:53 AM  Result Value Ref Range   Magnesium 1.8 1.8 - 2.5 mg/dL  Phosphorus   Collection Time: 10/25/23  5:53 AM  Result Value Ref Range   Phosphorus 1.5 (L) 2.3 - 4.5 mg/dL     Medications: Scheduled Meds: . atorvastatin   40 mg Oral Daily  . baclofen   20 mg Oral BID  . buPROPion   150 mg Oral Daily  . cefTRIAXone  1 g Intravenous Q24H  . cholecalciferol   1,000 Units Oral Daily  . clopidogreL   75 mg Oral Daily  . enoxaparin   40 mg Subcutaneous Daily  . hydrocortisone   10 mg Oral Daily 1400  . hydrocortisone   20 mg Oral Daily  . letrozole   2.5 mg Oral Daily  . mirtazapine   15 mg Oral QHS  . norethindrone   5 mg Oral Daily  . pantoprazole   40 mg Oral Daily  . potassium phosphate IV (ADULT peripheral)  30 mmol Intravenous Once  . pregabalin   100 mg Oral BID  . tamsulosin   0.4 mg Oral Daily   Continuous Infusions: . lactated Ringers  125 mL/hr at 10/25/23 0541   PRN Meds:.acetaminophen , dextrose 50% in water, glucagon, HYDROmorphone , HYDROmorphone  **OR** HYDROmorphone , hydrOXYzine  HCL, lidocaine , ondansetron  **OR** ondansetron , promethazine , tiZANidine   Assessment / Plan: DANITA PROUD is a 43 y.o. female pmhx of hereditary spastic paraplegia, small fiber neuropathy, previous CVA x 2, neurogenic bladder, endometriosis, chronic pain disorder, frequent UTIs, nephrolithiasis, dysautonomia, depression, migraine headaches and GERD,  presents with chief complaint of weakness and fever.   # Sepsis # Lactic acidosis, resolved  # UTI  # Ileal conduit s/p cystectomy  # Hx nephrolithiasis Pt presenting with fever, tachycardia, leukocytosis, and weakness iso recent outpatient treatment for UTI. She does have some degree of immunosuppression iso steroids for her adrenal insufficiency. Since 7/1, pt has had progression of her CVA tenderness. She has complicated GU hx with multiple infections since her ileal conduit s/p cystectomy for spastic bladder. Given her recent outpatient UTI workup, most likely source of her septic presentation is urosepsis. Will complete infectious workup for other sources, however, though no other localizing sxs.  Dx: - UA w/ >100 WBCs - f/u reflex urine culture - f/u blood cultures - CXR pending - renal US  - right kidney no hydronephrosis, left kidney not visualized Tx: - s/p 2 L LR followed by maintenance LR - cont Ceftriaxone 1g q24h   # Adrenal insufficiency S/p stress dose steroids in the ED IV hydrocortisone  100mg . Home regimen 10mg  AM and 5mg  PM. Follows with endo, last seen 09/20/23. Suspected opioid-induced vs chronic illness-related corticosteroid insufficiency. - cont sick-dose of home hydrocortisone  20mg  AM and 10mg  PM - restart home dose when appropriate    # Increased tremors R > L # Left-side weakness  # Hx acute ischemic stroke, R posterior frontal lobe, 06/13/2023 Pt experienced an acute ischemic stroke w/ 2 small acute cortical infarcts in posterior right frontal lobe and an adjacent small acute infarct in right posterior frontal lobe white matter, suspected embolic in nature. Extensive workup including CT, MRI, MRA, echo, carotid US  unrevealing for etiology. Ziopatch eval also w/o afib or flutter. Hypercoag workup was negative. Was started on Plavix  (pt allergic to ASA) and atorvastatin  for secondary prevention. Of note, pt was recently evaluated by PCP for new left-sided weakness, and  she has increased tremors and weakness on presentation to the ED today. She was planned for MRI on Saturday iso  normal CTH outpatient last week.  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 Symptoms are characterized by spasticity, burning/dysesthetic pain, tremor, and autonomic dysfunction (urinary retention, constipation, orthostasis). Extensive neuromuscular workup including MRI brain/spine, LP, EMG/NCS, and neurology eval has been unrevealing. Differential includes hereditary spastic paraplegia (family history of neuropathy) vs functional neurological disorder vs sequelae of small fiber/autonomic neuropathy. Stress and temperature extremes exacerbate symptoms. Symptom burden remains high despite polypharmacy and alternative therapies. - cont home baclofen  20mg  BID - cont home Lyrica  100mg  BID - cont home tizanidine  2mg  qhs prn   # Chronic pain on chronic opioids Home pain regimen for chronic flank pain. Follows in pain clinic.  - cont home oral dilaudid  2-4mg  via pain scale prn  - IV dilaudid  1mg  q4 prn for breakthrough pain   # Depression  # Anxiety - cont home mirtazapine  15mg  qhs   # Nausea/vomiting, chronic # Esophagitis Follows with [facility].  - continue prn anti-emetics - cont PPI BID   # Endometrioma  Following with gynecology and ongoing management with Letrozole  and norethindrone . Lupron therapy is being considered, but neurology with concerns due to increased stroke risk given CVA hx. Ongoing discussions also being had among various OP providers re:  surgical intervention vs nerve block.  - cont home letrozole  2.5mg  daily  - cont home norethindrone  5mg  daily   # VTE Prophylaxis: low molecular weight heparin # Code Status: Full Code    ABIGAIL SHARYLE AR, MD Pager [phone] 10/25/2023   Future Appointments  Date Time Provider Department Center  10/27/2023 11:30 AM [facility]LE MR 4 [facility]  10/29/2023 10:45 AM Johnie Damien Dragon, PA [facility]  11/06/2023  8:30 AM Maree Jannett Hering, MD [facility]  12/12/2023 10:30 AM Yvone Etha Reusing, NP [facility]  12/19/2023 11:00 AM Maree Jannett Hering, MD [facility] C  03/25/2024 11:00 AM [facility] OPI DEXA 1 [facility]  03/25/2024 11:20 AM Chandra Breed, Hadassah Showers, MD [facility]  04/28/2024  2:30 PM Locklear, Ole Revel, MD [facility]  08/18/2025 10:00 AM Norina Lavern Backers, MD [facility]     ------------------------------------------------------------------------------- Attestation signed by Starr Elvina Coder, MD at 10/25/2023  7:53 PM Attestation Statement:   I personally saw and evaluated the patient, and participated in the management and treatment plan as documented in the resident/fellow note.  Agree w findings and plan as outlined above.  Will treat for complicated UTI and see how she responds. No significant history of resistant organisms so CRO seems reasonable for coverage.     NOPPON SHAUNNA STARR, MD  -------------------------------------------------------------------------------

## 2023-10-25 NOTE — Progress Notes (Signed)
  [facility]  Physical Therapy  ATTEMPTED VISIT NOTE  Patient Name:  Christine Chavez Room/Bed: 3202/3202-01    Attempted to see patient for treatment on this date.   Spoke with OT PTA. Per OT, pt reports too much pain and RN expresses concern for medical status. Hold for today.  Will follow up tomorrow.  ALISON PLAISTED-ST PIERRE, PT

## 2023-10-25 NOTE — ED Provider Notes (Signed)
 1:07 AM Assumed care of patient from off-going team. For more details, please see note from same day.  In brief, this is a 43 y.o. female with hx of hereditary spastic paraplegia, small fiber neuropathy, previous CVA x 2, neurogenic bladder, endometriosis, chronic pain disorder, frequent UTIs, nephrolithiasis, dysautonomia, depression, migraine headaches and GERD, presents with chief complaint of weakness and fever.  Initial vital signs: BP (!) 125/94   Pulse 79   Temp 37.2 C (99 F) (Tympanic)   Resp 16   LMP 02/15/2022 (Approximate)   SpO2 96%   Initial work-up significant for: - Infection in the urine.  Labs Reviewed  COMPREHENSIVE METABOLIC PANEL (CMP) - Abnormal; Notable for the following components:      Result Value   Chloride 111 (*)    Carbon Dioxide (CO2) 16 (*)    Bilirubin, Total 2.2 (*)    Anion Gap 13 (*)    All other components within normal limits  COMPLETE BLOOD COUNT (CBC) WITH DIFFERENTIAL - Abnormal; Notable for the following components:   WBC (White Blood Cell Count) 10.1 (*)    All other components within normal limits  CULTURE, URINE, ROUTINE WITH PYURIA SCREEN (REFLEXED FROM URINALYSIS) - Abnormal; Notable for the following components:   Clarity Cloudy (*)    Protein, Urinalysis Trace (*)    Ketones, Urinalysis Trace (*)    Blood, Urinalysis 3+ (*)    Leukocytes, Urinalysis 1+ (*)    Red Blood Cells, Urinalysis 30 (*)    WBC, UA 116 (*)    All other components within normal limits   Narrative:    Reporting of microscopic urinalysis (UA) bacteria and yeast results was discontinued on August 30th, 2021 due to poor clinical predictive value for urinary tract infection (UTI). Providers should use other clinical signs and symptoms to diagnose UTI including urine leukocyte esterase, white blood cell count, and culture in accordance with local and national guidelines. Additional information and rationale can obtained by emailing [email or web address].   SHOCK PANEL, VENOUS - Abnormal; Notable for the following components:   PCO2, Venous 31 (*)    PO2, Venous 67 (*)    Base Excess, Venous -7 (*)    Bicarbonate, Venous 17 (*)    CO2 TOTAL, VENOUS 18 (*)    % O2 Hemoglobin, Venous 92.0 (*)    Potassium, Venous 5.0 (*)    Lactate, Venous 3.4 (*)    All other components within normal limits  PROTHROMBIN TIME (INR) - Normal  ACTIVATED PARTIAL THROMBOPLASTIN TIME (APTT) - Normal  LACTATE, PLASMA (LACTIC ACID) VENOUS - Normal  CULTURE, BLOOD  CULTURE, BLOOD  CULTURE, URINE, REFLEXED FROM PYURIA SCREEN  PROLACTIN  LACTATE, REPEAT  POC GLUCOSE    Outstanding studies / consultations include: - Medicine consult and admit  Plan/Dispo at time of sign-out: Admitted to medicine  ED Course  ED Course as of 10/25/23 0155  Wed Oct 24, 2023  2309 I assumed care on 10/24/2023 at 11:09 PM at shift change. Per verbal report from the previous care team:   Will need GM admit for pyelonephritis. New fever. Worsening flank pain. Nausea. Got abx and switched to bactrim  yesterday, has had one or two. Has spastic paraplegia with tremors and spasticity. CT imaging outpatient looked unremarkable. Will maybe need inpatient MRI.  [CN]  2315 Lactate, Blood: 1.5 [CN]  Thu Oct 25, 2023  0003 Upon initial evaluation, patient has significant tremors in arms and legs.  She is able to communicate during these tremors.  Patient family request that patient receives her hydrocortisone  dose in the ED. [CE]  0105 Upon reevaluation, patient has worsening tremors.  Patient was given hydrocortisone  and Valium  for her symptoms. [CE]  0155 Prolactin: 16.11 [CN]  0155 Patient admitted to gen med.  [CN]    ED Course User Index [CE] Julee Carlin Garre, MD [CN] Hollie Lonni Agent, MD     Dispo: Admit  ------------------------------- Carlin Julee, MD Emergency Medicine, PGY-1     Julee Carlin Garre, MD Resident 10/25/23 [phone]

## 2023-10-25 NOTE — Care Plan (Addendum)
 Patient arrived from ED accompanied by husband Deward and friend. Patient having visible tremors. Patient reporting 8/10 pain and nauseous, this RN reached out to MD about IV pain and nausea medication. IV zofran  and IV dilaudid  given each x1. Patient had brain CT and renal US  done overnight. After patient came back from renal US , patient was reporting dizziness, tingling all over, she was diaphoretic and had a rash on her shoulders, this RN notified first call, Landy, she came to bedside to assess, picture of rash in chart. Patient prefers one of her support people to go with her to procedures and diagnostic tests to be her advocate.  Problem: Knowledge deficit: Goal: Patient/caregiver ability to state and carry out methods to decrease the pain will improve Description:      Outcome: Progressing Note:   Goal: Patient/caregiver ability to identify factors that increase the pain will improve Outcome: Progressing Note:     Problem: Alteration in comfort: Goal: Patient/caregiver's expressions of having a comfortable level of knowledge regarding medication regimen will improve Outcome: Progressing   Problem: Altered ability to manage pain: Goal: Ability to develop a pain control plan will improve Outcome: Not Progressing Note:   Goal: Satisfaction with pain management regimen will improve Outcome: Not Progressing

## 2023-10-26 NOTE — Telephone Encounter (Signed)
 Christine Chavez case Production designer, theatre/television/film with [facility] calling to advise that patient missed her appointment with [facility] yesterday due to being hospitalized at [facility] for an extremely bad UTI.  Call back number is [phone] Patient can be contacted at [phone] No certain day of estimated discharge

## 2023-10-26 NOTE — Progress Notes (Signed)
 General Medicine Daily Progress Note  Patient ID: Christine Chavez is a 43 y.o. female who is currently Hospital Day: 3 with principal problem of Complicated UTI (urinary tract infection)   Interval History: - no acute events overnight  Pt is feeling slightly improved this morning. She is still nauseous this morning and has some pain in her left flank.   Physical Exam:  Current Vital Signs 24h Vital Sign Ranges  T 36.7 C (98 F) (10/26/23 0808) Temp  Avg: 36.9 C (98.5 F)  Min: 36.7 C (98 F)  Max: 37.1 C (98.8 F)  BP 93/51 (10/26/23 0811) BP  Min: 87/50  Max: 113/76  HR 56 (10/26/23 0608) Pulse  Avg: 68.3  Min: 56  Max: 83  RR 21 (10/26/23 0808) Resp  Avg: 20  Min: 19  Max: 21  O2sat 96 %   SpO2  Avg: 97.3 %  Min: 96 %  Max: 98 %       Current Shift Ins/Outs 24h Total Ins/Outs  Time Ins Outs No intake/output data recorded. 07/10 0701 - 07/11 0700 In: 500 [P.O.:400] Out: 2700 [Urine:2700]       Weights   First 66.2 kg (146 lb) (10/25/23 0758)   Last 66.2 kg (146 lb) (10/25/23 0758)    GEN: alert, pleasant, in NAD HEENT: MMM LUNGS: normal work of breathing CV: regular rate and rhythm ABD: Soft, NT/ND EXT: Warm, well-perfused, no edema SKIN: No rash or lesions appreciated NEURO: Face symmetric, intermittent tremors on exam, weak voice  Selected Labs and Data: Recent Labs  Lab 10/24/23 2145 10/25/23 0047 10/25/23 0553 10/26/23 0340  NA 140   < > 138 140  K 3.7  --  3.6 2.9*  CL 111*  --  110* 113*  CO2 16*  --  17* 20*  BUN 9  --  8 3*  CREATININE 1.0  --  1.0 0.9  GLUCOSE 95  --  116 116  CALCIUM  9.2  --  8.9 8.2*   < > = values in this interval not displayed.   Recent Labs  Lab 10/24/23 2145 10/25/23 0553 10/26/23 0340  AST 24 21 14*  ALT 23 23 14   ALKPHOS 59 55 38  TBILI 2.2* 2.2* 0.8     Recent Labs  Lab 10/24/23 2145 10/25/23 0553 10/26/23 0340  WBC 10.1* 10.3* 8.5  HGB 13.8 12.8 10.2*  HCT 40.9 38.1 31.2*  PLT 332 348 262   Recent  Labs  Lab 10/24/23 2145  APTT 29.4  INR 1.1        Recent Results (from the past 24 hours)  Complete Blood Count (CBC)   Collection Time: 10/26/23  3:40 AM  Result Value Ref Range   WBC (White Blood Cell Count) 8.5 3.2 - 9.8 x10^9/L   Hemoglobin 10.2 (L) 11.7 - 15.5 g/dL   Hematocrit 68.7 (L) 64.9 - 45.0 %   Platelets 262 150 - 450 x10^9/L   MCV (Mean Corpuscular Volume) 92 80 - 98 fL   MCH (Mean Corpuscular Hemoglobin) 30.1 26.5 - 34.0 pg   MCHC (Mean Corpuscular Hemoglobin Concentration) 32.7 31.0 - 36.0 %   RBC (Red Blood Cell Count) 3.39 (L) 3.77 - 5.16 x10^12/L   RDW-CV (Red Cell Distribution Width) 13.8 11.5 - 14.5 %   NRBC (Nucleated Red Blood Cell Count) 0.00 0 x10^9/L   NRBC % (Nucleated Red Blood Cell %) 0.0 %   MPV (Mean Platelet Volume) 10.9 7.2 - 11.7 fL   Slide  Review/Morphology Yes   Comprehensive Metabolic Panel (CMP)   Collection Time: 10/26/23  3:40 AM  Result Value Ref Range   Sodium 140 135 - 145 mmol/L   Potassium 2.9 (L) 3.5 - 5.0 mmol/L   Chloride 113 (H) 98 - 108 mmol/L   Carbon Dioxide (CO2) 20 (L) 21 - 30 mmol/L   Urea Nitrogen (BUN) 3 (L) 7 - 20 mg/dL   Creatinine 0.9 0.4 - 1.0 mg/dL   Glucose 883 70 - 859 mg/dL   Calcium  8.2 (L) 8.7 - 10.2 mg/dL   AST (Aspartate Aminotransferase) 14 (L) 15 - 41 U/L   ALT (Alanine Aminotransferase) 14 10 - 39 U/L   Bilirubin, Total 0.8 0.4 - 1.5 mg/dL   Alk Phos (Alkaline Phosphatase) 38 24 - 110 U/L   Albumin 2.7 (L) 3.5 - 4.8 g/dL   Protein, Total 5.1 (L) 6.2 - 8.1 g/dL   Anion Gap 7 3 - 12 mmol/L   BUN/CREA Ratio 4 (L) 6 - 27   Glomerular Filtration Rate (eGFR)  81 mL/min/1.73sq m     Medications: Scheduled Meds: . atorvastatin   40 mg Oral Daily  . baclofen   20 mg Oral BID  . buPROPion   150 mg Oral Daily  . cefTRIAXone  1 g Intravenous Q24H  . cholecalciferol   1,000 Units Oral Daily  . clopidogreL   75 mg Oral Daily  . enoxaparin   40 mg Subcutaneous Daily  . hydrocortisone   10 mg Oral Daily 1400   . hydrocortisone   20 mg Oral Daily  . letrozole   2.5 mg Oral Daily  . mirtazapine   15 mg Oral QHS  . norethindrone   5 mg Oral Daily  . pantoprazole   40 mg Oral Daily  . potassium chloride   40 mEq Oral Once  . potassium chloride  in 0.9% sodium chloride   40 mEq Intravenous Once  . pregabalin   100 mg Oral BID  . tamsulosin   0.4 mg Oral Daily   Continuous Infusions:   PRN Meds:.acetaminophen , dextrose 50% in water, glucagon, HYDROmorphone , HYDROmorphone  **OR** HYDROmorphone , hydrOXYzine  HCL, lidocaine , melatonin, ondansetron  **OR** ondansetron , promethazine , tiZANidine   Assessment / Plan: Christine Chavez is a 43 y.o. female pmhx of hereditary spastic paraplegia, small fiber neuropathy, previous CVA x 2, neurogenic bladder, endometriosis, chronic pain disorder, frequent UTIs, nephrolithiasis, dysautonomia, depression, migraine headaches and GERD, presents with chief complaint of weakness and fever.   # Sepsis # Lactic acidosis, resolved  # UTI  # Ileal conduit s/p cystectomy  # Hx nephrolithiasis Pt presenting with fever, tachycardia, leukocytosis, and weakness iso recent outpatient treatment for UTI. She does have some degree of immunosuppression iso steroids for her adrenal insufficiency. Since 7/1, pt has had progression of her CVA tenderness. She has complicated GU hx with multiple infections since her ileal conduit s/p cystectomy for spastic bladder. Given her recent outpatient UTI workup, most likely source of her septic presentation is urosepsis. Will complete infectious workup for other sources, however, though no other localizing sxs.  Dx: - UA w/ >100 WBCs - reflex urine culture pending - blood cultures NG1D - CXR - normal - renal US  - right kidney no hydronephrosis, left kidney not visualized Tx: - mIVF 181mL/hr LR for persistent n/v - cont Ceftriaxone 1g q24h   # Adrenal insufficiency S/p stress dose steroids in the ED IV hydrocortisone  100mg . Home regimen 10mg  AM and 5mg   PM. Follows with endo, last seen 09/20/23. Suspected opioid-induced vs chronic illness-related corticosteroid insufficiency. - cont sick-dose of home hydrocortisone  20mg  AM and 10mg  PM -  restart home dose when appropriate    # Increased tremors R > L # Left-side weakness  # Hx acute ischemic stroke, R posterior frontal lobe, 06/13/2023 Pt experienced an acute ischemic stroke w/ 2 small acute cortical infarcts in posterior right frontal lobe and an adjacent small acute infarct in right posterior frontal lobe white matter, suspected embolic in nature. Extensive workup including CT, MRI, MRA, echo, carotid US  unrevealing for etiology. Ziopatch eval also w/o afib or flutter. Hypercoag workup was negative. Was started on Plavix  (pt allergic to ASA) and atorvastatin  for secondary prevention. Of note, pt was recently evaluated by PCP for new left-sided weakness, and she has increased tremors and weakness on presentation to the ED today. She was planned for MRI on Saturday iso normal CTH outpatient last week.  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 Symptoms are characterized by spasticity, burning/dysesthetic pain, tremor, and autonomic dysfunction (urinary retention, constipation, orthostasis). Extensive neuromuscular workup including MRI brain/spine, LP, EMG/NCS, and neurology eval has been unrevealing. Differential includes hereditary spastic paraplegia (family history of neuropathy) vs functional neurological disorder vs sequelae of small fiber/autonomic neuropathy. Stress and  temperature extremes exacerbate symptoms. Symptom burden remains high despite polypharmacy and alternative therapies. - cont home baclofen  20mg  BID - cont home Lyrica  100mg  BID - cont home tizanidine  2mg  qhs prn   # Chronic pain on chronic opioids Home pain regimen for chronic flank pain. Follows in pain clinic.  - cont home oral dilaudid  2-4mg  via pain scale prn  - IV dilaudid  1mg  q4 prn for breakthrough pain   # Depression  # Anxiety - cont home mirtazapine  15mg  qhs   # Nausea/vomiting, chronic # Esophagitis Follows with [facility].  - continue prn anti-emetics - cont PPI BID   # Endometrioma  Following with gynecology and ongoing management with Letrozole  and norethindrone . Lupron therapy is being considered, but neurology with concerns due to increased stroke risk given CVA hx. Ongoing discussions also being had among various OP providers re: surgical intervention vs nerve block.  - cont home letrozole  2.5mg  daily  - cont home norethindrone  5mg  daily   # VTE Prophylaxis: low molecular weight heparin # Code Status: Full Code    Christine SHARYLE AR, MD Pager [phone] 10/26/2023  ------------------------------------------------------------------------------- Attestation signed by Starr Elvina Coder, MD at 10/26/2023  2:29 PM (Updated) Attestation Statement:   I personally saw and evaluated the patient, and participated in the management and treatment plan as documented in the resident/fellow note.  Pt appears improved today. Less diaphoretic, speech much stronger and easier to understand. Improved focus and energy in the room.  Still reports nausea and some challenges with po intake. Afebrile overall.  She inquired about MRI which we was scheduled as an outpt and wished to get it done here. WE had originally cancelled it but after discussing with her agreed to re-order it.  Husband arrived in the middle of the discussion and was displeased at the the prospect of  potentially not getting the mri done.  We notified nursing leadership to follow up.  Medically the patient seems to have an organism that is susceptible and  she could be transitioned to oral antibiotics.  If she can tolerate it, she could go home.   NOPPON SHAUNNA LINK, MD  -------------------------------------------------------------------------------

## 2023-10-26 NOTE — Consults (Signed)
 WOC Nurse Inpatient Note: Christine Chavez Jul 27, 1980, 43 y.o. FMW:[id number] Location: [facility] GENERAL MEDICINE, 3202-01  Length of stay:1 days  History: Past Medical History:  Diagnosis Date  . Abnormal finding on MRI of brain 04/28/2015  . Anaphylactic reaction, initial encounter 08/26/2020  . Anesthesia complication    sometimes difficult or slow to wake up  . Anxiety January 2020  . Bilateral leg edema 07/25/2021  . Bilateral leg edema 07/25/2021  . Bilateral leg weakness 03/20/2017  . Bladder spasm 01/24/2019  . Chronic constipation 12/19/2017  . Chronic pain   . Fever, unspecified 10/24/2022  . Functional tremor 03/23/2017  . GERD (gastroesophageal reflux disease)   . Hereditary spastic paraplegia (CMS/HHS-HCC) 08/2020  . History of motion sickness   . History of urinary retention 03/28/2017   Followed by urology, Dr. Elda  . History of urinary retention 03/28/2017   Levator spasm, trigger point injections, vaginal valium , PT,    . Kidney stone 04/25/2017  . Lab test positive for detection of COVID-19 virus 06/18/2021   pt reported asymptomatic  . Migraine without status migrainosus, not intractable 03/10/2015  . Moderate single current episode of major depressive disorder (CMS/HHS-HCC) 11/04/2015  . Muscle weakness (generalized) 03/14/2015  . Nephrolithiasis 04/16/2016  . Neurogenic bladder 01/15/2018  . Neurological muscle weakness 12/19/2017  . Neuropathy   . Pelvic cramping   . PONV (postoperative nausea and vomiting)    scop patch helped  . Poor intravenous access   . Slurring of speech 03/23/2017   Patient with intermittent episodes of slurred speech.   SABRA Upper extremity weakness 03/14/2015  . Voiding dysfunction 05/29/2017   Trigger point injections - levator spasm     Principal Problem:   Complicated UTI (urinary tract infection) Active Problems:   Migraine without status migrainosus, not intractable   Bilateral leg weakness    Functional tremor   Neurogenic dysfunction of the urinary bladder   Abdominal pain   S/P ileal conduit (CMS/HHS-HCC)   Immunosuppressed status (CMS/HHS-HCC)   Endometriosis    WOC Consult Reason: Ostomy Continence/IAD Urostomy In-person visit  Assessment:  Consulted for ostomy supplies and concern for yeast-rash on the peristomal skin.  I initially met with patient at 11:20am this morning.  Patient is independent with ostomy care.  She reports her husband brought her the supplies she needs from home.  She is wearing a gray 1 piece urostomy pouch which she states is a Retail buyer (appears to be the [employer]) with Brava strips placed around the adhesive barrier.   Both of these products are non-formulary at [facility].  The stoma is pink and moist and the pouch is connected to a bedside drainage bag.  Patient reports that the skin under the pouch was itching last night but has improved this morning after taking PO Diflucan .  She reports that there is peristomal skin breakdown which has caused difficulty keeping the pouch intact.  We discussed the crusting procedure, with Nystatin  powder in place of stoma powder, to improve skin condition and pouch adherence.  She states she has had yeast infections to the peristomal skin in the past and is familiar with treatment.  I returned later in the afternoon at 2:30pm when the Nystatin  powder was available for assessment and pouch change.  Patient was working with therapy at that time.  I returned again at 3:00pm and Care RN reports that the nurse manager is in the patient's room at that time and will likely need a  little while longer to speak with patient and family member at bedside.  I discussed plan of care with Care RN for patient to change pouch and crust with Nystatin  powder, and requested that she take a photo for the chart.  She reports that the patient had already discussed the same plan with her and states that she will take a photo for the  chart.   Treatment provided during consult:  First Call Provider contacted with request for topical Nystatin  powder order to apply to the peristomal skin.  Barriers to pouching: peristomal skin breakdown  Patient Support System: Husband at bedside.  Recommendations:  Care RN to assist patient as needed with Urostomy pouch change 2x week and PRN, if leakage occurs. Use washcloth moistened with warm water to cleanse peristomal skin. Allow to dry before placing pouching system. Only use Stoma Powder (sap#12709) with No Sting barrier spray (sap#[id number]) to crust when there are red, open, raw areas to peristomal skin. Pouch should be emptied when 1/3-1/2 full.   1.Trace pattern onto the back of the ostomy wafer, cut out.   2.Remove the old ostomy pouch with Sting Free adhesive remover.  3.Clean skin with water, pat dry.   4.Apply Nystatin  powder (pharmacy order) to the peristomal skin and seal with no sting barrier film.  5.Apply wafer and pouch around the stoma. 6. Change pouch every 4 days and prn leakage.   Ostomy supply List:  Alternate pouching option if needed.  Patient has used previously.  Ostomy Product SAP# MFR#  [employer] SOFT CONVEX Cut to Fit 1 piece urostomy pouch  [id number]                    OR    [employer] SOFT CONVEX Cut To Fit Wafer 1 3/4  Cut to 1 [id number]  [employer] Urostomy Pouch 1 3/4  [id number]  Accessories    ConvaTec Sensicare No Sting Adhesive Remover Spray [id number] [phone]  ConvaTec Stomahesive Powder- Step #1 for Raw Skin [id number]  Cavilon 81M No Sting Spray- Step #2 for Raw Skin [id number]    To obtain additional wound or ostomy supplies that are not stocked on the unit  Log on to the [employer] Service Now  Click on "Get IT  Select "Supply Chain Customer Service from list of categories on left OR Search Supply Chain Customer Service Follow the link to complete and submit the request form.  Response times: Standard- 1 hr, 10 mins; Urgent- 40  minutes    Plan of care discussed with:  Care RN Family/Caregiver Patient Provider  Follow up plan:  WOC Nurse to sign off. Please reconsult WOC Nurse for additional skin/wound/ostomy issues. Reconsult Wound Care if wound deteriorates and further recommendations are needed  Signature Lauraine Getting BSN, RN, [facility] WOC Clinical Services Nurse [facility] Pager: [phone] Email: [email or web address]   Time spent in consultation: 1 hour 15 minutes

## 2023-10-28 NOTE — Progress Notes (Signed)
 Prior to Admission Medication History             Christine Chavez's medication history has been completed by a Continuity of Barr[employer]'s clerk.  Resources Utilized to [employer] Medication Information: patient interview, review of outpatient pharmacy refill records, and call to home pharmacy  Updates made to home medication list: Updated diazepam , ondansetron , hydroxyzine , and pantoprazole  Removed tamsulosin  Added ascorbic acid  Please review the updated home medication list below and contact the floor specific pharmacist with any further questions. This prior to admission medication information was obtained to the best of our abilities; however, its accuracy assumes patient reliability at the time of the interview and clinical discretion should be used to determine the appropriateness of continuing or discontinuing any medications on admission and discharge.   PAUL M PLECZKOWSKI, PharmD  Medications Prior to Admission  Medication Sig Dispense Refill  . acetaminophen  (TYLENOL ) 500 mg capsule Take 2 capsules (1,000 mg total) by mouth every 8 (eight) hours as needed 30 capsule 3  . ascorbic acid, vitamin C, (VITAMIN C) 500 MG tablet Take 2 tablets by mouth once daily    . atorvastatin  (LIPITOR) 40 MG tablet Take 1 tablet (40 mg total) by mouth once daily 90 tablet 3  . baclofen  (LIORESAL ) 20 MG tablet Take 1 tablet (20 mg total) by mouth 2 (two) times daily 180 tablet 1  . buPROPion  (WELLBUTRIN  XL) 150 MG XL tablet TAKE 1 TABLET BY MOUTH EVERY DAY 90 tablet 3  . calcium  carbonate (TUMS ORAL) Take 2 tablets by mouth once daily as needed    . cholecalciferol  (VITAMIN D3) 1000 unit capsule Take 1,000 Units by mouth once daily    . clopidogreL  (PLAVIX ) 75 mg tablet Take 1 tablet (75 mg total) by mouth once daily 90 tablet 3  . cranberry fruit concentrate (AZO CRANBERRY ORAL) Take 1 tablet by mouth once daily    . diazePAM  (VALIUM ) 5 MG tablet Place 5 mg vaginally at bedtime  as needed    . fremanezumab-vfrm 225 mg/1.5 mL AtIn Inject 225 mg subcutaneously monthly for 360 days 1.5 mL 11  . hydrocortisone  (CORTEF ) 5 MG tablet Take 2 tablets (10 mg) 8AM and 1 tablet (5 mg) at 3 PM. Double dose per sick day dose. 120 tablet 12  . HYDROmorphone  (DILAUDID ) 4 MG tablet Take 0.5-1 tablets (2-4 mg total) by mouth every 8 (eight) hours as needed for Pain 60 tablet 0  . hydrOXYzine  (ATARAX ) 25 MG tablet Take 1 tablet by mouth 3 (three) times daily as needed for Itching 30 tablet 0  . Lactobacillus acidophilus (PROBIOTIC ACIDOPHILUS ORAL) Take 1 tablet by mouth 2 (two) times daily    . letrozole  (FEMARA ) 2.5 mg tablet Take 2.5 mg by mouth once daily    . methenamine hippurate (HIPREX) 1 gram tablet Take 1 g by mouth 2 (two) times daily    . mirtazapine  (REMERON ) 15 MG tablet TAKE 1 TABLET BY MOUTH EVERY DAY AT NIGHT 90 tablet 3  . naloxone (NARCAN) 4 mg/actuation nasal spray Place 1 spray (4 mg total) into one nostril once as needed (if not breathing or overdose is suspected.) for up to 1 dose Give 2nd dose in 5-10 min if not responding or if sx return for up to 1 dose. 2 each 1  . norethindrone  (AYGESTIN ) 5 mg tablet Take 5 mg by mouth once daily    . ondansetron  (ZOFRAN -ODT)  8 MG disintegrating tablet Take 8 mg by mouth every 6 (six) hours as needed for Nausea    . pantoprazole  (PROTONIX ) 40 MG DR tablet Take 1 tablet (40 mg total) by mouth once daily Take 30 mins before meal (Patient taking differently: Take 40 mg by mouth 2 (two) times daily) 90 tablet 3  . pregabalin  (LYRICA ) 100 MG capsule Take 1 capsule (100 mg total) by mouth 2 (two) times daily 180 capsule 3  . promethazine  (PHENERGAN ) 25 MG tablet Take 1 tablet (25 mg total) by mouth every 8 (eight) hours as needed for Nausea or Vomiting 60 tablet 2  . sulfamethoxazole -trimethoprim  (BACTRIM  DS) 800-160 mg tablet Take 1 tablet (160 mg of trimethoprim  total) by mouth 2 (two) times daily for 7 days 14 tablet 0  . tiZANidine   (ZANAFLEX ) 2 MG tablet Take 2 mg by mouth at bedtime as needed    . ubrogepant  (UBRELVY ) 50 mg Tab Take 50 mg by mouth as directed (for migraine) May take a second dose after 2 hours if needed. 16 tablet 5  . EPINEPHrine (EPIPEN) 0.3 mg/0.3 mL auto-injector Inject 0.3 mg into the muscle once as needed    . [START ON 11/16/2023] HYDROmorphone  (DILAUDID ) 4 MG tablet Take 0.5-1 tablets (2-4 mg total) by mouth every 8 (eight) hours as needed for Pain 60 tablet 0  . HYDROmorphone  (DILAUDID ) 4 MG tablet Take 0.5-1 tablets (2-4 mg total) by mouth every 8 (eight) hours as needed for Pain 60 tablet 0  . promethazine  (PHENERGAN ) 12.5 MG suppository Place 1 suppository (12.5 mg total) rectally every 6 (six) hours as needed for Nausea 24 suppository 0  . SOLU-CORTEF  ACT-O-VIAL, PF, 100 mg/2 mL injection Inject 2 mLs (100 mg total) into the muscle as directed FOR EMERGENCY USE. ADMINISTER IF UNABLE TO TAKE PO HYDROCORTISONE  1 each 1  . syringe with needle (BD LUER-LOK SYRINGE) 3 mL 25 gauge x 1 Syrg Use 1 each as directed (for steroid administration) 1 each 0  . syringe with needle, safety 3 mL 25 gauge x 1 Syrg Use 1 each as directed (for steroid administration) 1 each 12

## 2023-10-28 NOTE — Care Plan (Signed)
 Patient A&O x4, calm and cooperative. Family at bedside throughout the day.  Patient with abdominal pain through the shift. PRN dilaudid  PO and IV given per MAR. Patient with intermittent nausea and emesis x1 this evening. PRN phenergan  PO and IV given per MAR.  PRN Zanaflex  given x2 per [facility] for muscle spasms.  Patient started on miralax  this shift. No BM yet.   Problem: Knowledge deficit: Goal: Patient/caregiver ability to state and carry out methods to decrease the pain will improve Description:   Outcome: Progressing Goal: Patient/caregiver ability to identify factors that increase the pain will improve Outcome: Progressing   Problem: Altered ability to manage pain: Goal: Ability to develop a pain control plan will improve Outcome: Progressing Note:   Goal: Patient's ability to cope will be supported Outcome: Progressing Note: Pa   Problem: Nausea/Vomiting Management: Goal: Management of Nausea/Vomiting Outcome: Progressing

## 2023-10-28 NOTE — Progress Notes (Signed)
 General Medicine Daily Progress Note  Patient ID: Christine Chavez is a 43 y.o. female who is currently Hospital Day: 5 with principal problem of Complicated UTI (urinary tract infection)   Interval History: - No acute events overnight - Feeling ok overall today - Had some nausea and two episodes of emesis last night - Took a shower on the 7th floor last night, which was great, but does feel like it wiped her out - No BM yet - Left side continues to feel weak - Hd some chills last night and felt like she had a low-grade fever  Physical Exam:  Current Vital Signs 24h Vital Sign Ranges  T 36.6 C (97.8 F) (10/28/23 0750) Temp  Avg: 36.9 C (98.4 F)  Min: 36.6 C (97.8 F)  Max: 37.3 C (99.1 F)  BP 96/56 (10/28/23 0750) BP  Min: 96/56  Max: 119/64  HR 71 (10/28/23 0750) Pulse  Avg: 74  Min: 71  Max: 80  RR 14 (10/28/23 0750) Resp  Avg: 15.7  Min: 14  Max: 17  O2sat 95 %   SpO2  Avg: 94.7 %  Min: 94 %  Max: 95 %       Current Shift Ins/Outs 24h Total Ins/Outs  Time Ins Outs No intake/output data recorded. 07/12 0701 - 07/13 0700 In: -  Out: 2530 [Urine:2500]       Weights   First 66.2 kg (146 lb) (10/25/23 0758)   Last 66.2 kg (146 lb) (10/25/23 0758)    GEN: alert, pleasant, in NAD, fatigued HEENT: MMM, OP clear LUNGS: breathing comfortably on room air, lungs clear to auscultation bilaterally in anterior lung fields CV: regular rate and rhythm, no M/R/G. 2+ radial and DP pulses bilaterally. ABD: Soft, NT/ND, urostomy pouch on RLQ with clear urine EXT: Warm, well-perfused, no edema SKIN: No rash or lesions appreciated NEURO: Face symmetric, intermittent tremors on exam, weak voice. Right grip strength 3-4/5, left grip strength 2-3/5. Left foot with foot drop and internal rotation. Right foot drop. Weak toe movements, weak PF/DF/EHL. Can lift anti-gravity briefly on the right. Can pull both legs up with knee flexion bilaterally.   Selected Labs and Data: Recent Labs  Lab  10/26/23 0340 10/27/23 0236 10/28/23 0243  NA 140 141 141  K 2.9* 3.4* 3.2*  CL 113* 112* 110*  CO2 20* 23 24  BUN 3* 3* 3*  CREATININE 0.9 0.8 0.8  GLUCOSE 116 94 111  CALCIUM  8.2* 8.6* 8.5*   Recent Labs  Lab 10/26/23 0340 10/27/23 0236 10/28/23 0243  AST 14* 15 15  ALT 14 15 15   ALKPHOS 38 43 47  TBILI 0.8 0.7 0.6     Recent Labs  Lab 10/26/23 0340 10/27/23 0235 10/28/23 0243  WBC 8.5 9.4 10.1*  HGB 10.2* 11.1* 11.6*  HCT 31.2* 33.7* 34.7*  PLT 262 294 322   Recent Labs  Lab 10/24/23 2145  APTT 29.4  INR 1.1        Recent Results (from the past 24 hours)  Complete Blood Count (CBC)   Collection Time: 10/28/23  2:43 AM  Result Value Ref Range   WBC (White Blood Cell Count) 10.1 (H) 3.2 - 9.8 x10^9/L   Hemoglobin 11.6 (L) 11.7 - 15.5 g/dL   Hematocrit 65.2 (L) 64.9 - 45.0 %   Platelets 322 150 - 450 x10^9/L   MCV (Mean Corpuscular Volume) 92 80 - 98 fL   MCH (Mean Corpuscular Hemoglobin) 30.6 26.5 - 34.0 pg  MCHC (Mean Corpuscular Hemoglobin Concentration) 33.4 31.0 - 36.0 %   RBC (Red Blood Cell Count) 3.79 3.77 - 5.16 x10^12/L   RDW-CV (Red Cell Distribution Width) 13.9 11.5 - 14.5 %   NRBC (Nucleated Red Blood Cell Count) 0.00 0 x10^9/L   NRBC % (Nucleated Red Blood Cell %) 0.0 %   MPV (Mean Platelet Volume) 11.0 7.2 - 11.7 fL  Comprehensive Metabolic Panel (CMP)   Collection Time: 10/28/23  2:43 AM  Result Value Ref Range   Sodium 141 135 - 145 mmol/L   Potassium 3.2 (L) 3.5 - 5.0 mmol/L   Chloride 110 (H) 98 - 108 mmol/L   Carbon Dioxide (CO2) 24 21 - 30 mmol/L   Urea Nitrogen (BUN) 3 (L) 7 - 20 mg/dL   Creatinine 0.8 0.4 - 1.0 mg/dL   Glucose 888 70 - 859 mg/dL   Calcium  8.5 (L) 8.7 - 10.2 mg/dL   AST (Aspartate Aminotransferase) 15 15 - 41 U/L   ALT (Alanine Aminotransferase) 15 10 - 39 U/L   Bilirubin, Total 0.6 0.4 - 1.5 mg/dL   Alk Phos (Alkaline Phosphatase) 47 24 - 110 U/L   Albumin 3.1 (L) 3.5 - 4.8 g/dL   Protein, Total 5.9 (L)  6.2 - 8.1 g/dL   Anion Gap 7 3 - 12 mmol/L   BUN/CREA Ratio 4 (L) 6 - 27   Glomerular Filtration Rate (eGFR)  93 mL/min/1.73sq m     Medications: Scheduled Meds: . atorvastatin   40 mg Oral Daily  . baclofen   20 mg Oral BID  . buPROPion   150 mg Oral Daily  . cefTRIAXone  1 g Intravenous Q24H  . cholecalciferol   1,000 Units Oral Daily  . clopidogreL   75 mg Oral Daily  . enoxaparin   40 mg Subcutaneous Daily  . [START ON 10/29/2023] hydrocortisone   10 mg Oral Daily  . hydrocortisone   5 mg Oral Daily 1400  . letrozole   2.5 mg Oral Daily  . mirtazapine   15 mg Oral QHS  . norethindrone   5 mg Oral Daily  . nystatin    Topical BID  . pantoprazole   40 mg Oral Daily  . polyethylene glycol  17 g Oral Daily  . potassium chloride  in 0.9% sodium chloride   40 mEq Intravenous Once  . pregabalin   100 mg Oral BID  . sennosides-docusate  2 tablet Oral BID  . tamsulosin   0.4 mg Oral Daily   Continuous Infusions:   PRN Meds:.acetaminophen , dextrose 50% in water, diphenhydrAMINE , glucagon, HYDROmorphone , HYDROmorphone  **OR** HYDROmorphone , hydrOXYzine  HCL, lidocaine , melatonin, promethazine , promethazine , tiZANidine   Assessment / Plan: Christine Chavez is a 43 y.o. female pmhx of hereditary spastic paraplegia, small fiber neuropathy, previous CVA x 2, neurogenic bladder, endometriosis, chronic pain disorder, frequent UTIs, nephrolithiasis, dysautonomia, depression, migraine headaches and GERD, presents with chief complaint of weakness and fever.   # Sepsis, resolved # Lactic acidosis, resolved  # UTI 2/2 klebsiella, present on admission # Ileal conduit s/p cystectomy  # Hx nephrolithiasis Pt presenting with fever, tachycardia, leukocytosis, and weakness iso recent outpatient treatment for UTI. She does have some degree of immunosuppression iso steroids for her adrenal insufficiency. Since 7/1, pt has had progression of her CVA tenderness. She has complicated GU hx with multiple infections since her  ileal conduit s/p cystectomy for spastic bladder. Given her recent outpatient UTI workup, most likely source of her septic presentation is urosepsis. Completed infectious workup for other sources, however, though no other localizing sxs or sources identified. Suspect one blood culture is  contaminant.  Dx: - UA w/ >100 WBCs - urine culture, 1-10k Klebsiella pneumoniae - blood cultures NG1D - CXR - normal - renal US  - right kidney no hydronephrosis, left kidney not visualized Tx: - cont Ceftriaxone 1g q24h x 7 day course (7/9 - 7/15)  # 1/2 Blood cx positive for staph spp. - repeat blood cx - started on Vancomycin 7/12 overnight, discontinued 7/12 am  # Adrenal insufficiency S/p stress dose steroids in the ED IV hydrocortisone  100mg . Home regimen 10mg  AM and 5mg  PM. Follows with endo, last seen 09/20/23. Suspected opioid-induced vs chronic illness-related corticosteroid insufficiency. - decrease back to home doses of hydrocortisone  10/5 mg PO qAM and qPM today   # Increased tremors R > L # Left-side weakness  # Hx acute ischemic stroke, R posterior frontal lobe, 06/13/2023 Pt experienced an acute ischemic stroke w/ 2 small acute cortical infarcts in posterior right frontal lobe and an adjacent small acute infarct in right posterior frontal lobe white matter, suspected embolic in nature. Extensive workup including CT, MRI, MRA, echo, carotid US  unrevealing for etiology. Ziopatch eval also w/o afib or flutter. Hypercoag workup was negative. Was started on Plavix  (pt allergic to ASA) and atorvastatin  for secondary prevention. Of note, pt was recently evaluated by PCP for new left-sided weakness, and she has increased tremors and weakness on presentation to the ED today. She was planned for MRI on Saturday iso normal CTH outpatient last week.  - CTH w/o intracranial abnormalities. MRI without evidence of stroke.  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 Symptoms are characterized by spasticity, burning/dysesthetic pain, tremor, and autonomic dysfunction (urinary retention, constipation, orthostasis). Extensive neuromuscular workup including MRI brain/spine, LP, EMG/NCS, and neurology eval has been unrevealing. Differential includes hereditary spastic paraplegia (family history of neuropathy) vs functional neurological disorder vs sequelae of small fiber/autonomic neuropathy. Stress and temperature extremes exacerbate symptoms. Symptom burden remains high despite polypharmacy and alternative therapies. - cont home baclofen  20mg  BID - cont home Lyrica  100mg  BID - cont home tizanidine  2mg  qhs prn   # Chronic pain on chronic opioids Home pain regimen for chronic flank pain. Follows in pain clinic.  - cont home oral dilaudid  2-4mg  via pain scale prn  - IV dilaudid  1mg  q4 prn for breakthrough pain  # Drug-induced constipation No BM since Tuesday (7/8).  - Miralax  daily - Sennokot-S 2 tabs PO BID - Suppository PRN if no BM later today or early tomorrow   # Depression  # Anxiety - cont home mirtazapine  15mg  qhs - stress consult ordered   # Nausea/vomiting, chronic # Esophagitis Follows with [facility].  - continue prn anti-emetics; will try to encourage PO dose as first line - cont PPI BID   # Endometrioma  Following with gynecology and ongoing management with Letrozole  and norethindrone . Lupron therapy is being considered, but neurology with concerns due to increased stroke risk given CVA hx. Ongoing discussions also being had among various OP providers re: surgical intervention vs nerve block.   - cont home letrozole  2.5mg  daily  - cont home norethindrone  5mg  daily   # VTE Prophylaxis: low molecular weight heparin # Code Status: Full Code  #  Discharge planning: PT recommended acute rehab  Attending Attestation   I confirm that I was present for the key and critical portions of the service, including a review of the patient's history and other pertinent data.  I personally examined the patient, and formulated the evaluation and/or treatment plan, discussed the case with involved consultants, and reviewed prior records. I have written today's note personally, though am working together with housestaff in the care of this patient.   Randall Salinas [facility]

## 2023-10-28 NOTE — Discharge Summary (Signed)
 [facility] Medicine Discharge Summary  Admit Date: 10/24/2023 Discharge Date: 11/02/2023  Admitting Physician: Gordy Chucky Code, MD Discharge Physician: Code Gordy Chucky, MD  Primary Care Provider: Johnie Damien Dragon, [place], Phone [phone]  Discharge Destination: Home  Admission Diagnoses:  Tachycardia [R00.0] Fever, unspecified [R50.9] Bilateral flank pain, unspecified [R10.9] Functional tremor [R25.1]  Discharge Diagnoses:  Principal Problem:   Complicated UTI (urinary tract infection) Active Problems:   Migraine without status migrainosus, not intractable   Bilateral leg weakness   Functional tremor   Neurogenic dysfunction of the urinary bladder   Abdominal pain   S/P ileal conduit (CMS/HHS-HCC)   Immunosuppressed status (CMS/HHS-HCC)   Endometriosis Resolved Problems:   * No resolved hospital problems. *  Primary Diagnosis: Admitted for complicated UTI due to Klebsiella pneumoniae  Changes Made (with rationale):  None, antibiotics completed inpatient.  To-Do List (incidental findings, follow-up studies, etc.): Home PT, OT  Anticipatory Guidance for Outpatient Care:  Follow up PCP, neurology    Results Pending at Discharge:  None Please see phone numbers at end of this summary for lab contact information.   Follow-up/Care Transition Plan: Sched. appts: Future Appointments  Date Time Provider Department Center  11/06/2023  8:30 AM Maree Jannett Hering, MD [facility] MARYL BROCKS  12/12/2023 10:30 AM Yvone Etha Reusing, NP [facility]  12/19/2023 11:00 AM Maree Jannett Hering, MD [facility] MARYL C  03/25/2024 11:00 AM DRAH OPI DEXA 1 RALOPIMBD M[facility]  03/25/2024 11:20 AM Chandra Breed, Hadassah Showers, MD [facility]  04/28/2024  2:30 PM Locklear, Ole Revel, MD [facility] MARYL C  08/18/2025 10:00 AM Norina Lavern Backers, MD [facility]    Follow-up info: No follow-up provider  specified.     Allergies/Intolerances:  Allergies  Allergen Reactions  . Advil [Ibuprofen] Shortness Of Breath and Swelling  . Aspirin Shortness Of Breath and Swelling  . Gemtesa [Vibegron] Anaphylaxis  . Morphine  Hives    Itching as well. This has happened twice. Last witness w/ itchy hived all over pt's neck, check, torso, and abdomen.  Dilaudid  ok  . Toradol [Ketorolac] Rash    Rash, throat tingling  . Bupropion  Hcl Palpitations  . Hiprex [Methenamine Hippurate] Rash    Facial rash  . Levaquin [Levofloxacin] Other (See Comments)    Severe achilles tendon pain   . Vancomycin Dizziness and Itching    Pt tolerates with Benadryl  pre-med  Other Reaction(s): Dizziness    Pt tolerates with Benadryl  pre-med  . Iron Dextran Hives    Developed LUE urticaria about 15 minutes into iron dextran infusion.    . Prochlorperazine  Anxiety and Other (See Comments)    Out of body experience     New Adverse Drug Events: none  Medications:     Current Discharge Medication List     These medications have been CHANGED      Instructions  HYDROmorphone  4 MG tablet Quantity: 60 tablet Refills: 0 Start taking on: November 16, 2023 What changed: Another medication with the same name was removed. Continue taking this medication, and follow the directions you see here.  Commonly known as: DILAUDID  Take 0.5-1 tablets (2-4 mg total) by mouth every 8 (eight) hours as needed for Pain Last time this was given: 4 mg on November 02, 2023 11:52 AM   pantoprazole  40 MG DR tablet Quantity: 90 tablet Refills: 3 What changed:  when to take this additional instructions  Commonly known as: PROTONIX  Take 1 tablet (40 mg total) by mouth  once daily Take 30 mins before meal Last time this was given: 40 mg on November 02, 2023  8:26 AM   tamsulosin  0.4 mg capsule Quantity: 30 capsule Refills: 0 What changed: See the new instructions.  Commonly known as: FLOMAX  Take 1 capsule (0.4 mg total) by mouth once  daily Take 30 minutes after same meal each day. Last time this was given: 0.4 mg on November 02, 2023  8:26 AM       CONTINUE taking these medications      Instructions  acetaminophen  500 mg capsule Quantity: 30 capsule Refills: 3  Commonly known as: TYLENOL  Take 2 capsules (1,000 mg total) by mouth every 8 (eight) hours as needed Last time this was given: Ask your nurse or doctor   atorvastatin  40 MG tablet Quantity: 90 tablet Refills: 3  Commonly known as: LIPITOR Take 1 tablet (40 mg total) by mouth once daily Last time this was given: 40 mg on November 02, 2023  8:27 AM   AZO CRANBERRY ORAL Refills: 0  Take 1 tablet by mouth once daily   baclofen  20 MG tablet Quantity: 180 tablet Refills: 1  Commonly known as: LIORESAL  Take 1 tablet (20 mg total) by mouth 2 (two) times daily Last time this was given: 20 mg on November 02, 2023  6:30 PM   BD LUER-LOK SYRINGE 3 mL 25 gauge x 1 Syrg Quantity: 1 each Refills: 0 Generic drug: syringe with needle  Use 1 each as directed (for steroid administration)   buPROPion  150 MG XL tablet Quantity: 90 tablet Refills: 3  Commonly known as: WELLBUTRIN  XL TAKE 1 TABLET BY MOUTH EVERY DAY Doctor's comments: Please discard remainder of rx sent on 12/20/22  and replace with this one. Thanks! Diagnosis code F32.1 Last time this was given: 150 mg on November 02, 2023  8:30 AM   cholecalciferol  1000 unit capsule Refills: 0  Commonly known as: VITAMIN D3 Take 1,000 Units by mouth once daily Last time this was given: Ask your nurse or doctor   clopidogreL  75 mg tablet Quantity: 90 tablet Refills: 3  Commonly known as: PLAVIX  Take 1 tablet (75 mg total) by mouth once daily Last time this was given: 75 mg on November 02, 2023  8:27 AM   diazePAM  5 MG tablet Refills: 0  Commonly known as: VALIUM  Place 5 mg vaginally at bedtime as needed   EPINEPHrine 0.3 mg/0.3 mL auto-injector Refills: 0  Commonly known as: EPIPEN Inject 0.3 mg into the muscle  once as needed   fremanezumab-vfrm 225 mg/1.5 mL Atin Quantity: 1.5 mL Refills: 11  Inject 225 mg subcutaneously monthly for 360 days   hydrocortisone  5 MG tablet Quantity: 120 tablet Refills: 12  Commonly known as: CORTEF  Take 2 tablets (10 mg) 8AM and 1 tablet (5 mg) at 3 PM. Double dose per sick day dose. Doctor's comments: Provide extra tablets  for sick days (See instructions) Last time this was given: 5 mg on November 02, 2023  1:45 PM   hydrOXYzine  25 MG tablet Quantity: 30 tablet Refills: 0  Commonly known as: ATARAX  Take 1 tablet by mouth 3 (three) times daily as needed for Itching   letrozole  2.5 mg tablet Refills: 0  Commonly known as: FEMARA  Take 2.5 mg by mouth once daily Last time this was given: 2.5 mg on November 02, 2023  8:29 AM   methenamine hippurate 1 gram tablet Refills: 0  Commonly known as: HIPREX Take 1 g by mouth 2 (two)  times daily   mirtazapine  15 MG tablet Quantity: 90 tablet Refills: 3  Commonly known as: REMERON  TAKE 1 TABLET BY MOUTH EVERY DAY AT NIGHT Last time this was given: 15 mg on November 01, 2023  8:42 PM   naloxone 4 mg/actuation nasal spray Quantity: 2 each Refills: 1  Commonly known as: NARCAN Place 1 spray (4 mg total) into one nostril once as needed (if not breathing or overdose is suspected.) for up to 1 dose Give 2nd dose in 5-10 min if not responding or if sx return for up to 1 dose. For: opioid overdose   norethindrone  5 mg tablet Refills: 0  Commonly known as: AYGESTIN  Take 5 mg by mouth once daily Last time this was given: 5 mg on November 02, 2023  8:29 AM   ondansetron  8 MG disintegrating tablet Refills: 0  Commonly known as: ZOFRAN -ODT Take 8 mg by mouth every 6 (six) hours as needed for Nausea Last time this was given: Ask your nurse or doctor   pregabalin  100 MG capsule Quantity: 180 capsule Refills: 3  Commonly known as: LYRICA  Take 1 capsule (100 mg total) by mouth 2 (two) times daily Last time this was given:  100 mg on November 02, 2023  6:30 PM   PROBIOTIC ACIDOPHILUS ORAL Refills: 0  Take 1 tablet by mouth 2 (two) times daily   promethazine  12.5 MG suppository Quantity: 24 suppository Refills: 0  Commonly known as: PHENERGAN  Place 1 suppository (12.5 mg total) rectally every 6 (six) hours as needed for Nausea Last time this was given: Ask your nurse or doctor   promethazine  25 MG tablet Quantity: 60 tablet Refills: 2  Commonly known as: PHENERGAN  Take 1 tablet (25 mg total) by mouth every 8 (eight) hours as needed for Nausea or Vomiting Last time this was given: Ask your nurse or doctor   Solu-CORTEF  Act-O-Vial (PF) 100 mg/2 mL injection Quantity: 1 each Refills: 1 Generic drug: hydrocortisone  (PF)  Inject 2 mLs (100 mg total) into the muscle as directed FOR EMERGENCY USE. ADMINISTER IF UNABLE TO TAKE PO HYDROCORTISONE  Last time this was given: Ask your nurse or doctor   syringe with needle, safety 3 mL 25 gauge x 1 Syrg Quantity: 1 each Refills: 12  Use 1 each as directed (for steroid administration)   tiZANidine  2 MG tablet Refills: 0  Commonly known as: ZANAFLEX  Take 2 mg by mouth at bedtime as needed Last time this was given: 2 mg on November 01, 2023  2:09 PM   TUMS ORAL Refills: 0  Take 2 tablets by mouth once daily as needed Last time this was given: 1,500 mg of salt on November 02, 2023 12:02 AM   UBRELVY  50 mg Tab Quantity: 16 tablet Refills: 5 Generic drug: ubrogepant   Take 50 mg by mouth as directed (for migraine) May take a second dose after 2 hours if needed. Doctor's comments: As many as insurance allows   vitamin C 500 MG tablet Refills: 0 Generic drug: ascorbic acid (vitamin C)  Take 2 tablets by mouth once daily       STOP taking these medications    sulfamethoxazole -trimethoprim  800-160 mg tablet Commonly known as: BACTRIM  DS         Brief History of Present Illness:  As per HPI by Dr. Landy, Christine Chavez is a 43 y.o. female pmhx of  hereditary spastic paraplegia, small fiber neuropathy, previous CVA x 2, neurogenic bladder, endometriosis, chronic pain disorder, frequent UTIs, nephrolithiasis, dysautonomia,  depression, migraine headaches and GERD, presents with chief complaint of weakness and fever.   History obtained mostly from family at bedside, patient with weakness and some difficulty communicating.  Per patient's friend at bedside, she began worsening this afternoon after her pain clinic appointment.  She began to have increased tremulousness, chills, weakness in addition to the flank pain that persisted over the last week.  She denies any changes to medications other than antibiotics prescribed by her PCP.  No recent sick contacts.  Patient does have some tremors at baseline, but her shaking this afternoon was far worse than usual.   Patient initially presented with ongoing pelvic and flank pain to her PCP on 7/1, UA w/ reflex to culture growing 10-50K CFU Klebsiella pneumonia.  She was prescribed nitrofurantoin , which she took for 1 week.  She subsequently represented with worsening symptoms on 7/8 her PCP office, so she was then prescribed Bactrim  with a plan for 7-day course.  She had taken 2 doses of Bactrim  prior to presenting to the ED.   Of note, patient was also following up with her PCP regarding a severe tremor episode on 6/27 associated left-sided weakness, dysphagia and hypertension.  PCP ordered a CT head that was unremarkable, plan was for MRI scheduled on Saturday.  This is in the context of prior CVA 05/2023 seen on MRI in which the patient had residual right-sided deficits.   Briefly, she also has a complex GU history, including multiple prior pelvic and abdominal surgeries, recurrent GU infections, and chronic/persistent abdominal and pelvic pain.  Most notably, she has an ileal conduit s/p cystectomy. She also follows with gyn-onc for an endometrioma. Most recently seen by urology on 5/21, at which time her  chronic pain flank pain was attributed to known right adnexal cyst versus MSK pain.   On presentation to the ED, vitals were T (!) 38 C (100.4 F), HR (!) 117, BP (!) 140/112, RR 22, and satting 98 % on RA. Labs notable for Na 139, K 3.7, HCO3 16*, BUN 9, sCr 1.0. CBC notable for WBC 10.3*, Hgb 12.8, plts 348.  She was given 2 L LR, IV ceftriaxone, blood and urine cultures were obtained, and then she was admitted to general medicine for further evaluation. _____________________   Hospital Course by Problem:  # Disposition: patient was recommended for acute rehab but ultimately preferred to discharge to home with home health. Has all DME needed at home.  # Sepsis 2/2 UTI Klebsiella UTI, resolved # Ileal conduit s/p cystectomy  Patient presented with fever, tachycardia, leukocytosis, weakness, and worsening neurologic deficits in the setting of recently initiated outpatient treatment for UTI.  She also endorsed acute worsening of her chronic CVA tenderness over the same time.  She has a complicated GU history with multiple infections since undergoing cystectomy and ileal conduit for her spastic bladder.  This is not the first hospitalization for complicated UTI.  Outpatient urine cultures grew Klebsiella resistant to ampicillin .  Prior response to oral antibiotics in the outpatient setting has been poor, potentially in the setting of frequent issues with nausea and vomiting.  UA here demonstrated 1+ leukocytes and 116 WBC, although she had already started antibiotics as an outpatient.  Urine culture grew 1-10K Klebsiella pneumoniae.  Renal ultrasound did not show evidence of hydronephrosis or abscess on the right, although unable to visualize the left.  Additional infectious workup was notable for blood cultures 1/2 positive for Staph capitis, but suspect contaminant.  She received ceftriaxone from 7/9-7/15  to complete a total 7-day course of antibiotics.  Felt that we could have transitioned to oral  antibiotic, however patient had frequent and vomiting symptoms so opted to finish a course of IV antibiotics.  Endorsed improvement of her symptoms after antibiotic.  # 1/2 blood cx positive for staph capitis, suspect contaminant Blood cultures from admission (7/9) grew staphylococcal spp in 1/2 bottles. Given single dose of Vanc on 7/12 which was subsequently discontinued. Speciated as Staph capitis, suspect contaminant. Repeat blood cx (7/12) were negative.  # Candidiasis of Urostomy Stoma Patient reported whitish discharge from stoma with peristomal rash, skin breakdown c/f candidiasis. Received 1 dose of PO diflucan . Evaluated by WOC who recommended nystatin  powder. Did not prescribe at discharge, as patient had been frequently declining to use this.  # Tremors R > L # Left-side weakness  # Hx acute ischemic stroke, R posterior frontal lobe, 06/13/2023 Patient was hospitalized at [facility] in 05/2023 after presenting with L-sided weakness and difficulty speaking. MRI brain at the time revealed two small acute cortical infarcts in posterior right frontal lobe and an adjacent small acute infarct in right posterior frontal lobe white matter, suspected embolic in nature. Extensive workup including CT, MRI, MRA, echo, carotid US  unrevealing for etiology. Ziopatch eval also w/o afib or flutter. Hypercoagulable workup was negative. She was started on Plavix  (pt allergic to ASA) and atorvastatin  for secondary prevention. Of note, pt was recently evaluated by PCP for new left-sided weakness, and she had increased tremors and weakness on presentation to the ED. She was planned to undergo outpatient MRI on 7/12 iso normal outpatient CT Head. CT Head in ED without acute abnormalities. Follow-up MRI brain (7/12) showed scattered foci of subcortical white matter hyperintensities s/o mild chronic small vessel disease but no acute abnormalities. Suspect worsening symptoms represent recrudescence of prior stroke  in s/o infection. Neurologic symptoms improved following IV antibiotics. Continued home plavix  and atorvastatin .   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 Symptoms are characterized by spasticity, burning/dysesthetic pain, tremor, and autonomic dysfunction (urinary retention, constipation, orthostasis). Extensive neuromuscular workup including MRI brain/spine, LP, EMG/NCS, and neurology eval has been unrevealing. Differential includes hereditary spastic paraplegia (family history of neuropathy) vs functional neurological disorder vs sequelae of small fiber/autonomic neuropathy. Stress and temperature extremes exacerbate symptoms. Symptom burden remains high despite medications and alternative therapies. Evaluated by PT/OT during admission who recommended acute rehab on discharge but patient declined as mentioned above. Also prescribed AFO for right foot drop. Continued home, baclofen  20mg  BID, lyrica  100mg  BID, tizanidine  2mg  qhs prn  # Adrenal insufficiency S/p stress dose steroids in the ED with IV hydrocortisone  100mg . Home regimen 10mg  AM and 5mg  PM. Follows with endo, last seen 09/20/23. Suspected opioid-induced vs chronic illness-related corticosteroid insufficiency. Received sick dose steroids initially (7/10 - 7/13) before being transitioned back to home dose without issue.  # Chronic pain on chronic opioids Patient on chronic opioids for chronic flank pain. Follows in pain clinic. Home oral dilaudid  2-4mg  q8h prn continued during admission along with IV dilaudid  for breakthrough. Returned to home regimen at discharge.  # Drug-induced constipation Last BM prior to admission was 7/8. Resolved with scheduled bowel regimen including miralax , senna, and PRN  suppository.  # Depression  # Anxiety Continue home mirtazapine . Stress management consulted during admission.  # Nausea/vomiting, chronic #  Esophagitis Follows with [facility]. Symptoms acutely worsening in s/o complicated UTI. Nausea improved with PO/IV phenergan  during admission. Returned to home medications at the time of discharge including protonix  BID.   # Endometrioma  Following with gynecology and ongoing management with Letrozole  and norethindrone . Lupron therapy is being considered, but neurology with concerns due to increased stroke risk given CVA hx. Ongoing discussions also being had among various OP providers re: surgical intervention vs nerve block. Home letrozole  and norethindrone  continued during admission.  Surgeries and Procedures Performed:  - None  _____________________  Discharge Exam:  BP 98/64 (BP Location: Right upper arm, Patient Position: Lying)   Pulse 70   Temp 37.1 C (98.7 F) (Oral)   Resp 16   Ht 170.2 cm (5' 7)   Wt 66.2 kg (146 lb)   LMP 02/15/2022 (Approximate)   SpO2 98%   BMI 22.87 kg/m    GEN: alert, pleasant, in NAD HEENT: MMM, OP clear LUNGS: breathing comfortably on room air, lungs clear to auscultation bilaterally in anterior lung fields CV: regular rate and rhythm, no M/R/G. ABD: Soft, NT/ND, urostomy pouch over RLQ EXT: Warm, well-perfused, no edema SKIN: No rash or lesions appreciated NEURO: Face symmetric, alert and oriented.  Pertinent Lab Testing: Recent Labs  Lab 10/30/23 0527 10/31/23 0254 11/01/23 0330  NA 142 139 138  K 3.4* 3.6 4.0  CL 109* 109* 108  CO2 22 22 22   BUN 4* 6* 7  CREATININE 0.8 0.8 0.8  GLUCOSE 127 92 100  CALCIUM  8.7 8.6* 8.6*   Recent Labs  Lab 10/30/23 0527 10/31/23 0254 11/01/23 0330  AST 13* 13* 13*  ALT 14 13 13   ALKPHOS 49 45 42  TBILI 0.5 0.7 1.1    Recent Labs  Lab 10/30/23 0527 10/31/23 0254 11/01/23 0330  WBC 11.6* 10.1* 9.6  HGB 11.8 11.5* 11.6*  HCT 35.6  35.1 34.7*  PLT 329 322 331   No results for input(s): APTT, INR in the last 168 hours.    Other Pertinent Labs:  - N/A  Micro:  Lab Results  Component Value Date   BLDCULT No growth detected. 10/27/2023   BLDCULT No growth detected. 10/27/2023    Lab Results  Component Value Date   URCULT (!) 10/24/2023    1,000-10,000 CFU/mL Klebsiella pneumoniae (Klebsiella pneumoniae complex)   URCULT  10/24/2023    1,000-10,000 CFU/mL Non-beta hemolytic Gram positive Organism    Pertinent Imaging:   CT BRAIN WITHOUT CONTRAST (10/25/23) FINDINGS: Brain Parenchyma: There is no hemorrhage, cerebral edema, acute cortical infarction, mass, mass effect, or midline shift. Ventricles and Sulci: Normal for age. Extra-Axial Spaces: No extra-axial fluid collection. Basal Cisterns: Normal.  Paranasal Sinuses: Normal. Mastoids: Normal.  Orbits: Normal. Cranium and Bones: Normal. Soft Tissues: Normal. Impression No etiology of mental status change identified.  US  RENAL COMPLETE (10/25/23) Impression: No right hydronephrosis. Left kidney not visualized.  US  PELVIC PROTOCOL TRANSABDOMINAL AND TRANSVAGINAL COMPLETE (10/30/23) Impression: 1.  Normal arterial and venous waveforms in the right ovary. 2.  Left ovary not visualized on the images provided and reviewed.  MRI BRAIN WITH AND WITHOUT CONTRAST (10/27/23)  FINDINGS: Brain Parenchyma:  No hemorrhage, cerebral edema, acute cortical infarction, mass, mass effect, or midline shift.  No abnormal enhancement. Small foci of subcortical white matter T2/FLAIR hyperintensities that are greater in number than expected for patient's age that is unchanged from 2024. Partially empty sella. Ventricles and Sulci: Normal for age. Extra-Axial Spaces: No extra-axial fluid collection. Basal  Cisterns: Normal. Intracranial Flow-Voids: Normal.  Paranasal Sinuses: Normal. Mastoids: Normal. Orbits: Normal. Cranium: Normal. Visualized upper cervical  spine: No high grade stenosis.  Impression 1.  No acute intracranial abnormality. No abnormal enhancement. 2.  Unchanged scattered foci of subcortical white matter T2/FLAIR hyperintensities that are greater in number than expected for patient's age. This can be seen in the setting of migraines versus age advanced mild chronic ischemic small vessel disease. _____________________  Code Status: Full Code Goals of care were not addressed during this admission.   Status on Discharge:  Current activity: Chairfast (11/02/23 9166) Current mobility: Slightly limited (11/02/23 9166)  Activity Recommendation: activity as tolerated  Other Discharge Instructions: Services setup at discharge: Home Health PT/OT Tubes/lines at discharge: urostomy  Diet: Diet regular L0 Thin Liquids Oral Supplements - Adult All Supplements; [employer] Breakfast Essentials-Chocolate + 8 oz. 2% Milk; With Meals  Wound Care Order Instructions     None       _____________________  Time spent on discharge process: 35 minutes    Christine ANNE MENEGAS, MD [facility]  11/02/2023   Hospital Contact Information:  [facility]) [facility]) [facility])  Pending tests:  Laboratory: [phone] Microbiology: [phone] Pathology: [phone] Radiology: [phone]  General questions: [phone] Pending tests: Laboratory: [phone] Microbiology: [phone] Pathology: [phone] Radiology: [phone]  General questions:  [phone] Pending tests:  Laboratory: [phone] Microbiology: [phone] Pathology: [phone] Radiology: [phone]  General questions:  [phone]    ------------------------------------------------------------------------------- Attestation signed by Girtha Gordy Press, MD at 11/03/2023  7:54 AM Attending Attestation:   I was immediately available or designated on call and agree with  the above written plan.     Gordy Girtha, MD [facility] Medicine [facility] 11/03/2023  -------------------------------------------------------------------------------

## 2023-10-28 NOTE — Care Plan (Signed)
  Problem: Knowledge deficit: Goal: Patient/caregiver ability to identify factors that increase the pain will improve Outcome: Progressing Note: Patient satisfied with pain goal. Pain Intervention(s): Medication (IV push short acting opioid given)    Problem: Alteration in comfort: Goal: Patient/caregiver's expressions of having a comfortable level of knowledge regarding medication regimen will improve Outcome: Progressing Note: Patient satisfied with pain goal. Pain Intervention(s): Medication (IV push short acting opioid given)    Problem: Altered ability to manage pain: Goal: Ability to develop a pain control plan will improve Outcome: Progressing Note: Patient satisfied with pain goal. Pain Intervention(s): Medication (IV push short acting opioid given)    Problem: Altered ability to manage pain: Goal: Satisfaction with pain management regimen will improve Outcome: Progressing Note: Patient satisfied with pain goal. Pain Intervention(s): Medication (IV push short acting opioid given)

## 2023-10-29 ENCOUNTER — Ambulatory Visit: Payer: BC Managed Care – PPO

## 2023-10-29 ENCOUNTER — Ambulatory Visit: Admitting: Occupational Therapy

## 2023-10-29 NOTE — Care Plan (Signed)
   Problem: Knowledge deficit: Goal: Patient/caregiver ability to state and carry out methods to decrease the pain will improve Description:      Outcome: Progressing Note: Patient satisfied with pain goal. Pain Intervention(s): Medication (IV push short acting opioid given)    Problem: Knowledge deficit: Goal: Patient/caregiver ability to identify factors that increase the pain will improve Outcome: Progressing Note: Patient satisfied with pain goal. Pain Intervention(s): Medication (IV push short acting opioid given)    Problem: Alteration in comfort: Goal: Patient/caregiver's expressions of having a comfortable level of knowledge regarding medication regimen will improve Outcome: Progressing Note: Patient satisfied with pain goal. Pain Intervention(s): Medication (IV push short acting opioid given)    Problem: Altered ability to manage pain: Goal: Ability to develop a pain control plan will improve Outcome: Progressing Note: Patient satisfied with pain goal. Pain Intervention(s): Medication (IV push short acting opioid given)    Problem: Altered ability to manage pain: Goal: Pain level will decrease Outcome: Progressing Note: Patient satisfied with pain goal. Pain Intervention(s): Medication (IV push short acting opioid given)    Problem: Altered ability to manage pain: Goal: Patient's ability to cope will be supported Outcome: Progressing Note: Patient satisfied with pain goal. Pain Intervention(s): Medication (IV push short acting opioid given)    Problem: Nausea/Vomiting Management: Goal: Management of Nausea/Vomiting Outcome: Progressing

## 2023-10-31 ENCOUNTER — Ambulatory Visit: Payer: BC Managed Care – PPO

## 2023-10-31 ENCOUNTER — Ambulatory Visit: Admitting: Occupational Therapy

## 2023-11-02 NOTE — Progress Notes (Signed)
 Patient for discharge. A/O x 4. No PIV. Urostomy bag intact. AVS and all personal belongings with patient. Discharge via wheelchair accompanied by husband and transporter.

## 2023-11-05 ENCOUNTER — Ambulatory Visit: Admitting: Occupational Therapy

## 2023-11-05 ENCOUNTER — Ambulatory Visit: Payer: BC Managed Care – PPO

## 2023-11-05 ENCOUNTER — Telehealth: Payer: Self-pay

## 2023-11-05 NOTE — Telephone Encounter (Signed)
 Patient called due to no show. Patient recently discharged from hospital. Not able to make it today.   Christine Chavez  Leopoldo, PT, DPT Physical Therapist -  [facility]  Outpatient Physical Therapy- [facility] [phone]

## 2023-11-05 NOTE — Therapy (Incomplete)
 OUTPATIENT PHYSICAL THERAPY NEURO EVALUATION   Patient Name: Christine Chavez MRN: [id number] DOB:1980/06/22, 43 y.o., female Today's Date: 11/05/2023   PCP: Johnie Perkins REFERRING PROVIDER: Maree Hila   END OF SESSION:   Past Medical History:  Diagnosis Date   Bladder retention    Complication of anesthesia    ? seizures after anesthesia    Depression    Diarrhea due to malabsorption    Dizziness    Headache    Migraines    Neurogenic bladder    Pyelonephritis    Renal disorder    Vision abnormalities    Weight loss    Past Surgical History:  Procedure Laterality Date   ANTERIOR CRUCIATE LIGAMENT REPAIR  1997   APPENDECTOMY     BLADDER REMOVAL     COLONOSCOPY WITH PROPOFOL  N/A 11/11/2018   Procedure: COLONOSCOPY WITH PROPOFOL ;  Surgeon: Gaylyn Gladis PENNER, MD;  Location: [facility] ENDOSCOPY;  Service: Endoscopy;  Laterality: N/A;   CYSTOSCOPY WITH STENT PLACEMENT Right 04/17/2016   Procedure: CYSTOSCOPY WITH STENT PLACEMENT;  Surgeon: Belvie LITTIE Clara, MD;  Location: [facility] ORS;  Service: Urology;  Laterality: Right;   ESOPHAGOGASTRODUODENOSCOPY (EGD) WITH PROPOFOL  N/A 11/11/2018   Procedure: ESOPHAGOGASTRODUODENOSCOPY (EGD) WITH PROPOFOL ;  Surgeon: Gaylyn Gladis PENNER, MD;  Location: [facility] ENDOSCOPY;  Service: Endoscopy;  Laterality: N/A;   EXPLORATORY LAPAROTOMY  1999   HIP SURGERY Right    KIDNEY STONE SURGERY  04/2016   REVISION UROSTOMY CUTANEOUS     REVISION UROSTOMY CUTANEOUS  01/10/2018   SUPRAPUBIC CATHETER PLACEMENT  08/2017   TONSILLECTOMY     Patient Active Problem List   Diagnosis Date Noted   Stroke-like symptoms 06/13/2023   Refractory nausea and vomiting 02/22/2023   Syncope and collapse 02/19/2023   Palliative care by specialist 02/19/2023   UTI (urinary tract infection) 02/13/2023   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   Neurogenic bladder 02/13/2023   Hepatic steatosis 02/13/2023   Wheelchair dependent 02/13/2023   Secondary  adrenal insufficiency (HCC) 02/13/2023   Right flank pain 02/13/2023   Iron deficiency anemia 10/02/2022   Seizure (HCC) 11/11/2018   Major depressive disorder, recurrent episode, moderate (HCC) 02/07/2018   Nephrolithiasis 04/16/2016   Numbness 07/28/2015   Bladder retention 06/23/2015   Abdominal pain 06/04/2015   Dizziness 05/18/2015   Neck pain 05/18/2015   Complicated migraine 04/28/2015   Other fatigue 04/28/2015   Abnormal finding on MRI of brain 04/28/2015   Diarrhea 03/29/2015   H/O disease 03/29/2015   Abnormal weight loss 03/29/2015   Muscle weakness (generalized) 03/14/2015   Headache, migraine 03/10/2015    ONSET DATE: 06/13/23  REFERRING DIAG: CVA  THERAPY DIAG:  No diagnosis found.  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 Patient recently admitted on 10/24/23 and discharged from hospital on 11/02/23 for UTI with Klebsiella pneumoniae.  Pmh includes adrenal insufficiency, Hereditary spastic paraplegia (HSP), small fiber neuropathy c/b BLE paraplegia and neurogenic bladder s/p cystectomy w/ ileal condiuit (05/2022), chronic pain, migraine. Patient was recently hospitalized prior to this at [facility] from 04/30/23 to 05/27/23. Patient reports L sided weakness. Is having stabbing pains for a few minutes.   PAIN:  Are you having pain? {OPRCPAIN:27236}  PRECAUTIONS: Fall  RED FLAGS: Bowel or bladder incontinence: Yes:     WEIGHT BEARING RESTRICTIONS: No  FALLS: Has patient fallen in last 6 months? {fallsyesno:27318}  LIVING ENVIRONMENT: Lives with: lives with their family Lives in: House/apartment Stairs: ramp Has  following equipment at home: Environmental consultant - 4 wheeled, Wheelchair (manual), Grab bars, and Ramped entry  PLOF: Independent with basic ADLs  PATIENT GOALS: ***  OBJECTIVE:  Note: Objective measures were completed at Evaluation unless otherwise noted.  DIAGNOSTIC FINDINGS: MRI results received from [facility] 1. 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 2. Adjacent small acute infarct within the posterior right frontal lobe white matter. 3. Background moderate multifocal T2 FLAIR hyperintense signal abnormality within the cerebral white matter (with a subcortical white matter predominance), greater than expected for age. These signal changes are nonspecific and differential considerations include accelerated chronic small vessel ischemic disease, sequelae of chronic migraine headaches, sequelae of vasculitis/hypercoagulable state and sequelae of a prior infectious/inflammatory process, among others.   MRA head:   Significantly motion degraded examination. No proximal intracranial large vessel occlusion is identified. Motion artifact precludes accurate assessment for stenoses and for vessel irregularity.  COGNITION: Overall cognitive status: Within functional limits for tasks assessed   SENSATION: {sensation:27233}  COORDINATION: ***  EDEMA:  {edema:24020}  MUSCLE TONE: {LE tone:25568}  MUSCLE LENGTH: Hamstrings: Right *** deg; Left *** deg Debby test: Right *** deg; Left *** deg  DTRs:  {DTR SITE:24025}  POSTURE: {posture:25561}  LOWER EXTREMITY ROM:     {AROM/PROM:27142}  Right Eval Left Eval  Hip flexion    Hip extension    Hip abduction    Hip adduction    Hip internal rotation    Hip external rotation    Knee flexion    Knee extension    Ankle dorsiflexion    Ankle plantarflexion    Ankle inversion    Ankle eversion     (Blank rows = not tested)  LOWER EXTREMITY MMT:     MMT Right Eval Left Eval  Hip flexion    Hip extension    Hip abduction    Hip adduction    Hip internal rotation    Hip external rotation    Knee flexion    Knee extension    Ankle dorsiflexion    Ankle plantarflexion    Ankle inversion    Ankle eversion    (Blank rows = not tested)  BED MOBILITY:  {bed mobility:32615:p}  TRANSFERS: {transfers eval:32620}  RAMP:  {ramp eval:32616}  CURB:  {curb eval:32617}  STAIRS: {stairs eval:32618} GAIT: Findings: {GaitneuroPT:32644::Distance walked: ***,Comments: ***}  Gait pattern: step to pattern, knee flexed in stance- Right, knee flexed in stance- Left, ataxic, trunk flexed, poor foot clearance- Right, and poor foot clearance- Left Distance walked: 8 ft  Assistive device utilized: Walker - 4 wheeled Level of assistance: CGA and  Min A Comments: Jerking tonic  gait pattern   FUNCTIONAL TESTS:  {Functional tests:24029}  PATIENT SURVEYS:  {rehab surveys:24030}                                                                                                                              TREATMENT DATE: ***    PATIENT EDUCATION: Education details: *** Person educated: {Person educated:25204} Education method: {Education Method:25205} Education comprehension: {Education Comprehension:25206}  HOME EXERCISE PROGRAM: ***  GOALS: Goals reviewed with patient? Yes  SHORT TERM GOALS: Target date: ***  Patient will be independent in home exercise program to improve strength/mobility for better functional independence with ADLs.  Baseline: Goal status: INITIAL    LONG TERM GOALS: Target date: ***  Patient (< 32 years old) will complete five times sit to stand test in < 10 seconds indicating an increased LE strength and improved balance.  Baseline:  Goal status: INITIAL  2.  Patient will perform 10 MWT under 2 minutes with no rest breaks indicating improved functional ambulation.  Baseline:  Goal status:  INITIAL  3.  Patient will tolerate standing 3 minutes for ADL performance  Baseline:  Goal status: INITIAL  4.  Patient will increase BLE gross strength to 4+/5 and/or within 1lb of each limb  as to improve functional strength for independent gait, increased standing tolerance and increased ADL ability.  Baseline:  Goal status: INITIAL    ASSESSMENT:  CLINICAL IMPRESSION: Patient is a *** y.o. *** who was seen today for physical therapy evaluation and treatment for ***.   OBJECTIVE IMPAIRMENTS: Abnormal gait, cardiopulmonary status limiting activity, decreased activity tolerance, decreased balance, decreased coordination, decreased endurance, decreased mobility, difficulty walking, decreased ROM, decreased strength, hypomobility, increased fascial restrictions, impaired perceived functional ability, increased muscle spasms, impaired flexibility, impaired UE functional use, improper body mechanics, postural dysfunction, and pain.   ACTIVITY LIMITATIONS: carrying, lifting, bending, sitting, standing, squatting, sleeping, stairs, transfers, bed mobility, continence, bathing, toileting, dressing, self feeding, reach over head, hygiene/grooming, locomotion level, and caring for others  PARTICIPATION LIMITATIONS: meal prep, cleaning, laundry, interpersonal relationship, driving, shopping, community activity, yard work, and church  PERSONAL FACTORS: Age, Past/current experiences, Time since onset of injury/illness/exacerbation, and 3+ comorbidities: *** are also affecting patient's functional outcome.   REHAB POTENTIAL: Good  CLINICAL DECISION MAKING: Unstable/unpredictable  EVALUATION COMPLEXITY: High  PLAN:  PT FREQUENCY: 2x/week  PT DURATION: 12 weeks  PLANNED INTERVENTIONS: 97164- PT Re-evaluation, 97750- Physical Performance Testing, 97110-Therapeutic exercises, 97530- Therapeutic activity, [id number]- Neuromuscular re-education, 97535- Self Care, 02859- Manual therapy, [id number]- Gait  training, [phone]- Orthotic Initial, [phone]- Orthotic/Prosthetic subsequent, [phone]- Canalith repositioning, H9716- Electrical stimulation (unattended), [phone]- Electrical stimulation (manual), [id number]- Ultrasound, [id number]- Traction (mechanical), [id number] (1-2 muscles), 20561 (3+ muscles)- Dry Needling, Patient/Family education, Balance training, Stair training, Taping, Joint mobilization, Spinal mobilization, Vestibular training, Visual/preceptual remediation/compensation, DME instructions, Wheelchair mobility training, Cryotherapy, and Moist heat  PLAN FOR NEXT SESSION: ***   Davion Flannery, PT 11/05/2023, 8:01  AM

## 2023-11-07 ENCOUNTER — Ambulatory Visit: Payer: BC Managed Care – PPO

## 2023-11-07 ENCOUNTER — Ambulatory Visit: Admitting: Occupational Therapy

## 2023-11-12 ENCOUNTER — Ambulatory Visit: Admitting: Physical Therapy

## 2023-11-12 ENCOUNTER — Ambulatory Visit: Admitting: Occupational Therapy

## 2023-11-14 ENCOUNTER — Ambulatory Visit

## 2023-11-14 ENCOUNTER — Ambulatory Visit: Admitting: Occupational Therapy

## 2023-11-19 ENCOUNTER — Ambulatory Visit: Admitting: Physical Therapy

## 2023-11-19 ENCOUNTER — Ambulatory Visit: Admitting: Occupational Therapy

## 2023-11-21 ENCOUNTER — Encounter: Admitting: Occupational Therapy

## 2023-11-21 ENCOUNTER — Ambulatory Visit

## 2023-11-26 ENCOUNTER — Ambulatory Visit: Admitting: Physical Therapy

## 2023-11-26 ENCOUNTER — Encounter: Admitting: Occupational Therapy

## 2023-11-28 ENCOUNTER — Ambulatory Visit

## 2023-11-28 ENCOUNTER — Ambulatory Visit: Admitting: Occupational Therapy

## 2023-11-29 NOTE — Discharge Summary (Signed)
 ------------------------------------------------------------------------------- Attestation signed by Jinny Alm Seta, MD at 11/29/23 [phone] Teaching Physician Note   I saw and evaluated [facility] Eleanora Sharps, participating in the key portions of the service on the day of discharge.  I reviewed the resident's note and agree with the discharge plans and disposition.  I personally spent over 30 minutes in discharge planning services.  Pain management a lingering issue and have counseled Ms Andujo regarding likely improvement in pain caused by the ovarian cyst. Follow up arranged.   Issues Impacting Complexity of Management: <redacted file path> The patient's disposition has been complicated by the following clinically significant conditions requiring additional evaluation and treatment or having a significant effect of this patient's care: - General physical deterioration POA requiring additional resources: DME, PT, or OT - Fatigue affecting clinical decision making including medication, disposition, or length of stay - Weakness affecting clinical decision making including medication, disposition, or length of stay   Code Status: Full Code  Care for a suspected or confirmed infection was provided by an ID specialist in this encounter. [phone])  Alm DELENA Jinny, MD  [facility] Division of Infectious Diseases        -------------------------------------------------------------------------------   Physician Discharge Summary [facility] [address] [facility] [address] [place] [address] Dept: [phone] Loc: [phone]   Identifying Information:  Victorian Gunn 02-Jan-1981 [id number]  Primary Care Physician: Johnie Damien Dragon, [place]   Code Status: Full Code  Admit Date: 11/16/2023  Discharge Date: 11/29/2023   Discharge To: Home with Home Health and/or PT/OT  Discharge Service: [facility] - Infectious Disease Floor Team (MED K - Tower)   Discharge Attending Physician:  Alm Seta Jinny, MD  Discharge Diagnoses:  Principal Problem:   Fever (POA: Unknown) Active Problems:   Migraine without status migrainosus, not intractable (POA: Yes)   Moderate single current episode of major depressive disorder (CMS-HCC) (POA: Yes)   Functional tremor (POA: Yes)   Hereditary spastic paraplegia     (POA: Unknown)   S/P ileal conduit     (POA: Not Applicable)   Adrenal insufficiency (HHS-HCC) (POA: Unknown) Resolved Problems:   * No resolved hospital problems. 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 Urine cx at PCP office growing MSSA. S/p CTX x 1 and PO Bactrim . Since arrival to [facility], patient has remained afebrile, nontoxic-appearing, and otherwise hemodynamically stable. CTAP w/ contrast without evidence of hydroureteronephrosis, nephrolithiasis, or pyelonephritis.  Flank pain and Leukocytosis resolved. Transitioned to PO Cephalexin  8/6 and completed course on 8/10.   Hemorrhagic Ovarian Cyst Rupture Rapid called overnight 8/6 for severe LLQ pain. CT and TVUS revealed L multiloculated hemorrhagic ovarian cyst vs endometrioma.Gynecology saw her and would like to pursue possible surgery outpatient. She has an appointment with minimally invasive gynecology next month, they will try to move this appointment up. Gyn anticipates this will be a complicated surgery that is not without risks. Minimally invase gynecology appointment scheduled for 9/17. Hoping to get appointment moved up.   Chronic Pain/Nausea - Home hydromorphone  2mg  TID PRN - Home Promethazine  25mg  TID PRN - Tizanidine  2mg  nightly   -  Weaned off IV Dilaudid , increased PO Dilaudid  to 4mg  POq6   Adrenal insufficiency:  Continued home Hydrocortisone  10 mg AM + 5 mg PM  B12 deficiency: continue B12 1000mcg daily Vitamin D deficiency: continue 1250mcg daily Anxiety: wellbutrin  150mg  daily, linzess  145mcg daily, trintellix  5mg  daily GERD: continue pantoprazole  40mg  BID CVA 05/2023: Atorvastatin  40mg  daily, Plavix  75mg  daily  Outpatient Provider Follow Up Issues:  [ ]  Follow up with PCP, Dr. Marykay sent Epic in basket message [ ]  Follow up with [facility] regarding increased PO Dilaudid , Dr. Marykay sent Epic in basket message  Procedures: ______________________________________________________________________ Discharge Medications:    Your Medication List     PAUSE taking these medications    letrozole  2.5 mg tablet Wait to take this until your doctor or other care provider tells you to start again. Commonly known as: FEMARA  Take 1 tablet (2.5 mg total) by mouth daily.       STOP taking these medications    sulfamethoxazole -trimethoprim  400-80 mg per tablet Commonly known as: BACTRIM        START taking these medications    acetaminophen  500 MG tablet Commonly known as: TYLENOL  Take 2 tablets (1,000 mg total) by mouth every eight (8) hours.   diazePAM  5 MG tablet Commonly known as: VALIUM  Take 1 tablet (5 mg total) by mouth every six (6) hours as needed for muscle spasms.   ketoconazole 2 % cream Commonly known as: NIZORAL Apply 1 Application topically daily. Start taking on: November 30, 2023   ondansetron  4 MG disintegrating tablet Commonly known as: ZOFRAN -ODT Dissolve 1 tablet (4 mg total) in the mouth every six (6) hours as needed for up to 7 days.       CHANGE how you take these medications    baclofen  20 MG tablet Commonly known as: LIORESAL  Take 1 tablet (20 mg total) by mouth Three (3) times a day. What changed: when to take this   HYDROmorphone  4 MG tablet Commonly known  as: DILAUDID  Take 1 tablet (4 mg total) by mouth every six (6) hours for 5 days. What changed:  medication strength how much to take how to take this when to take this reasons to take this   tizanidine  4 MG tablet Commonly known as: ZANAFLEX  Take 1 tablet (4 mg total) by mouth every six (6) hours as needed. What changed:  medication strength how much to take when to take this       CONTINUE taking these medications    AJOVY SYRINGE 225 mg/1.5 mL Syrg Generic drug: fremanezumab-vfrm Inject 225 mg under the skin every twenty-eight (28) days.   ascorbic acid 500 MG tablet Generic drug: ascorbic acid (vitamin C) Take 1 tablet (500 mg total) by mouth daily.   atorvastatin  40 MG tablet Commonly known as: LIPITOR Take 1 tablet (40 mg total) by mouth daily.   buPROPion  150 MG 24 hr tablet Commonly known as: Wellbutrin  XL Take 1 tablet (150 mg total) by mouth daily.   butalbital-acetaminophen -caffeine 50-325-40 mg per tablet Commonly known as: ESGIC Take 1 tablet by mouth every four (4) hours as needed for headache.   cholecalciferol -25 mcg (1,000 unit) 1,000 unit (25 mcg) tablet Generic drug: cholecalciferol  (vitamin D3 25 mcg (1,000 units)) Take 1 tablet (25 mcg total) by mouth daily.   clopidogrel  75 mg tablet Commonly known as: PLAVIX  Take 1 tablet (75 mg total) by mouth daily.   hydrocortisone  5 MG tablet Commonly known as: CORTEF  Take 1 tablet (5 mg total) by mouth  every evening.   hydrocortisone  10 MG tablet Commonly known as: CORTEF  Take 1 tablet (10 mg total) by mouth every morning.   hydrOXYzine  25 MG tablet Commonly known as: ATARAX  Take 1 tablet (25 mg total) by mouth Three (3) times a day.   methenamine 1 gram tablet Commonly known as: HIPREX Take 1 tablet (1 g total) by mouth in the morning and 1 tablet (1 g total) in the evening. Take with meals.   mirtazapine  15 MG tablet Commonly known as: REMERON  Take 15 mg by mouth nightly.    norethindrone  5 mg tablet Commonly known as: AYGESTIN  Take 1 tablet (5 mg total) by mouth daily.   ondansetron  4 MG tablet Commonly known as: ZOFRAN  Take 1 tablet (4 mg total) by mouth every eight (8) hours as needed for nausea.   pantoprazole  40 MG tablet Commonly known as: Protonix  Take 40 mg by mouth Two (2) times a day.   pregabalin  100 MG capsule Commonly known as: LYRICA  Take 1 capsule (100 mg total) by mouth two (2) times a day.   promethazine  25 MG tablet Commonly known as: PHENERGAN  Take 1 tablet (25 mg total) by mouth every six (6) hours as needed for nausea.   UBRELVY  50 mg tablet Generic drug: ubrogepant  Take 1 tablet (50 mg total) by mouth as needed.   VEGETABLE LAXATIVE ORAL Take 1 tablet by mouth nightly as needed.   vitamin B-12 1000 MCG tablet Generic drug: cyanocobalamin  (vitamin B-12) Take 1,000 mcg by mouth daily.        Allergies: Aspirin, Gemtesa [vibegron], Ibuprofen, Nsaids (non-steroidal anti-inflammatory drug), Compazine  [prochlorperazine ], Morpholine analogues, and Vancomycin analogues ______________________________________________________________________ Pending Test Results: Pending Labs     Order Current Status   ABO/RH Collected (11/20/23 2222)       Most Recent Labs: All lab results last 24 hours - No results found for this or any previous visit (from the past 24 hours).  Relevant Studies/Radiology: US  Pelvis Limited Result Date: 11/21/2023 EXAM: US  PELVIS LIMITED ACCESSION: [id number] UN REPORT DATE: 11/20/2023 11:32 PM CLINICAL INDICATION: 43 years old with Evaluate for ovarian torsion of L ovary  LMP: Hysterectomy COMPARISON: CT abdomen and pelvis with contrast 11/20/2023 and 11/17/2023 TECHNIQUE: As per sonographer, the endovaginal pelvic procedure was explained to the patient and the patient verbally consented to the exam. Ultrasound views of the pelvis were obtained endovaginally and transabdominally using gray scale and color  Doppler imaging. Spectral Doppler imaging was also performed. FINDINGS: UTERUS/CERVIX: The uterus was surgically absent. OVARIES: The left ovary was well visualized transvaginally, poor visualization of the right adnexa region, and additional transabdominal imaging was performed. No definite follicles are visualized. The right ovary was not visualized. The left ovary measured approximately 6.1 x 0.4 x 4.2 cm. Suboptimal waveforms visualized on spectral Doppler with small volume of color flow. There is a heterogeneous left ovarian focus with posterior acoustic shadowing and lacy reticulated appearance. This focus appears to have layering hyperechoic material on transabdominal images, similar to the same day CT, consistent with hemorrhagic cyst that has enlarged when compared to the 08/02 comparison CT. Additionally there is an anechoic 2.0 x 1.4 x 1.4 cm simple left ovarian cyst, similar to prior study. OTHER: No abnormal pelvic free fluid.   -- There is a heterogeneous left ovarian  hemorrhagic cyst that has enlarged since the comparison CT. -- The right ovary was not visualized. Please see below for data measurements: LMP: Hysterectomy Endovaginal consent? As per sonographer, the endovaginal pelvic procedure was explained  to the patient and the patient verbally consented to the exam. Right ovary: Sagittal - cm; AP - cm; Transverse - cm Left ovary: Sagittal 6.1 cm; AP 0.4 cm; Transverse 4.2 cm  CT Abdomen Pelvis W Contrast Result Date: 11/21/2023 EXAM: CT ABDOMEN PELVIS W CONTRAST ACCESSION: [id number] UN REPORT DATE: 11/20/2023 9:59 PM CLINICAL INDICATION: 43 years old with abdominal pain  COMPARISON: 11/17/2023 CT abdomen pelvis TECHNIQUE: A helical CT scan of the abdomen and pelvis was obtained following IV contrast from the lung bases through the pubic symphysis. Images were reconstructed in the axial plane. Coronal and sagittal reformatted images were also provided for further evaluation. FINDINGS: LOWER  CHEST: Bibasilar atelectasis. LIVER: Normal liver contour.  No focal liver lesions. BILIARY: The gallbladder is normal in appearance. No biliary ductal dilatation.  SPLEEN: Normal in size and contour. PANCREAS: Normal pancreatic contour.  No focal lesions.  No ductal dilation. ADRENAL GLANDS: Normal appearance of the adrenal glands. KIDNEYS/URETERS: Symmetric renal enhancement.  No hydronephrosis.  No solid renal mass. Bilateral subcentimeter hypoattenuating lesions are too small to characterize. BLADDER: Cystectomy with ileal conduit. REPRODUCTIVE ORGANS: Multiloculated hypoattenuating left adnexal lesion with layering internal hyperdensities (2:120). There is surrounding free fluid and mild stranding. There does appear to be new twisting of the ovarian vasculature (2:115) when compared to prior. The uterus is surgically absent. No suspicious right adnexal lesion. GI TRACT: Right lower quadrant small bowel anastomosis relating to prior ileal conduit. No findings of bowel obstruction or acute inflammation. Colonic diverticulosis without evidence of diverticulitis. The appendix is not clearly identified but there are no secondary signs of appendicitis. PERITONEUM, RETROPERITONEUM AND MESENTERY: No free air.  No ascites.  No fluid collection. LYMPH NODES: No adenopathy. VESSELS: Hepatic and portal veins are patent.  Normal caliber aorta. Scant atherosclerotic calcifications of the abdominal aorta. BONES and SOFT TISSUES: No aggressive osseous lesions.  No focal soft tissue lesions.   Multiloculated left adnexal lesion with findings most consistent with a hemorrhagic cyst and surrounding free fluid suggestive of rupture. However, given the patient's new severe pain and new twisted appearance of the ovarian vessels, torsion cannot definitively be excluded. If clinically warranted, further evaluation with endovaginal ultrasound may be considered.   ECG 12 Lead Result Date: 11/17/2023 NORMAL SINUS RHYTHM RIGHTWARD  AXIS LOW VOLTAGE QRS T WAVE ABNORMALITY, CONSIDER INFERIOR ISCHEMIA ABNORMAL ECG WHEN COMPARED WITH ECG OF 02-Jun-2015 07:35, CRITERIA FOR SEPTAL INFARCT  ARE NO LONGER PRESENT T WAVE INVERSION NOW EVIDENT IN INFERIOR LEADS NONSPECIFIC T WAVE ABNORMALITY NOW EVIDENT IN ANTEROLATERAL LEADS Confirmed by Claudene Legions ([id number]) on 11/17/2023 3:24:29 PM  CT Abdomen Pelvis W Contrast Result Date: 11/17/2023 EXAM: CT ABDOMEN PELVIS W CONTRAST ACCESSION: [id number] UN REPORT DATE: 11/17/2023 3:39 AM CLINICAL INDICATION: 43 years old with urostomy, left flank pain, chills  COMPARISON: CT abdomen pelvis 06/02/2015 TECHNIQUE: A helical CT scan of the abdomen and pelvis was obtained following IV contrast from the lung bases through the pubic symphysis. Images were reconstructed in the axial plane. Coronal and sagittal reformatted images were also provided for further evaluation. FINDINGS: LOWER CHEST: Right greater than left bibasilar linear atelectasis/scarring. LIVER: Normal liver contour.  No focal liver lesions. BILIARY: The gallbladder is normal in appearance. No biliary ductal dilatation.  SPLEEN: Normal in size and contour. PANCREAS: Normal pancreatic contour.  No focal lesions.  No ductal dilation. ADRENAL GLANDS: Normal appearance of the adrenal glands. KIDNEYS/URETERS: Postsurgical changes of ileal conduit. Symmetric renal enhancement.  No hydronephrosis.  No solid renal mass. Subcentimeter  lower pole left renal hyperattenuating lesion measuring 0.6 cm (2:65) and additional subcentimeter right renal hypodensities are too small to characterize by CT. BLADDER: The bladder is surgically absent. REPRODUCTIVE ORGANS: Uterus is surgically absent. Left adnexal cysts versus bilocular cyst measuring up to 4.5 cm. Prominent vaginal cuff which is nonspecific and could be associated with postsurgical changes. GI TRACT: Postsurgical changes of ileal conduit with right lower quadrant small bowel anastomosis. Fecalized material in the  small bowel upstream from the anastomosis is suggestive of slow transit. Diverticulosis without diverticulitis. No findings of bowel obstruction or acute inflammation.  The appendix is not clearly identified but there are no secondary signs of appendicitis. PERITONEUM, RETROPERITONEUM AND MESENTERY: No free air.  No ascites.  No fluid collection. LYMPH NODES: No adenopathy. VESSELS: Hepatic and portal veins are patent.  Normal caliber aorta scattered calcifications of the aorta and its branch vessels.SABRA  BONES and SOFT TISSUES: Postsurgical changes to the anterior abdominal wall. No aggressive osseous lesions.  No focal soft tissue lesions.   -Postsurgical changes of ileal conduit without hydroureteronephrosis or CT evidence of pyelonephritis. -No other acute abnormalities in the abdomen or pelvis. -Subcentimeter lower pole left renal hyperattenuating lesion measuring 0.6 cm (2:65) and additional subcentimeter right renal hypodensities are too small to characterize by CT. 4-6 months follow-up with MRI could be considered for the better characterization of left renal hyperattenuating lesion. -Left adnexal cysts versus bilocular cyst measuring up to 4.5 cm which could still be physiologic. Follow-up pelvic ultrasound in 12 weeks could be considered due to limited assessment. ==================== MODIFIED REPORT: (11/17/2023 7:30 AM) This report has been modified from its preliminary version; you may check the prior versions of radiology report, results history link for prior report versions (if they were previously visible in Epic). -----------------------------------------------   ______________________________________________________________________ Discharge Instructions:  Activity Instructions     Activity as tolerated             Follow Up instructions and Outpatient Referrals    Ambulatory Referral to Home Health     Reason for referral: lifetime   Physician to follow patient's care: PCP    Disciplines requested:  Physical Therapy Occupational Therapy     Physical Therapy requested: Evaluate and treat   Occupational Therapy Requested: Evaluate and treat   Call MD for:  persistent nausea or vomiting     Call MD for:  severe uncontrolled pain     Call MD for: Temperature > 38.5 Celsius ( > 101.3 Fahrenheit)     Discharge instructions       Appointments which have been scheduled for you    Dec 31, 2023 9:00 AM (Arrive by 8:45 AM) RETURN GENERAL with Norleen Ozell Punch, MD [facility] PHYSICAL MEDICINE [facility] [address] [place] [facility] REGION) [address] [place] [address] [phone]     Jan 02, 2024 8:30 AM (Arrive by 8:15 AM) NEW VIDEO VISIT MYCHART with Charlies ONEIDA Simper, MD [facility] MINIMALLY INVASIVE GYNECOLOGIC SURGERY [place] [facility] REGION) [address]rd Floor [place] [address] [phone]  Having a My [facility] Chart account is important to be able to complete the visit.  If you do not already have a [facility] Chart account, our team will contact you to help you register for a [facility] Chart account.   Please sign into My [facility] Chart at least 15 minutes before your appointment to complete the eCheck-In process. You must complete eCheck-In before you can start your video visit.  For your video visit, you will need a computer with a working camera, speaker and microphone, a smartphone, or a tablet with internet access.  My [facility] Chart enables you to manage your health, send non-urgent messages to your provider, view your test results, schedule and manage appointments, and request prescription refills securely and conveniently from your computer or mobile device. Patients at [facility] can sign up for My [facility] Chart.  If you need assistance accessing your My [facility] Chart account or for assistance in reaching your provider's office to reschedule or cancel your appointment, please call [facility] [phone].                         ______________________________________________________________________ Discharge Day Services: BP 102/69   Pulse 72   Temp 36.8 C (98.3 F) (Oral)   Resp 18   Ht 162.6 cm (5' 4)   Wt 85.6 kg (188 lb 11.4 oz)   LMP 11/24/2018 (Exact Date) Comment: Irregular periods  SpO2 96%   BMI 32.39 kg/m   Pt seen on the day of discharge and determined appropriate for discharge.  Condition at Discharge: good  Length of Discharge: I spent greater than 30 mins in the discharge of this patient.  Kaitlyn S Ancell MD PGY1 Internal Medicine

## 2023-12-03 ENCOUNTER — Ambulatory Visit: Admitting: Occupational Therapy

## 2023-12-03 ENCOUNTER — Ambulatory Visit: Admitting: Physical Therapy

## 2023-12-05 ENCOUNTER — Ambulatory Visit: Admitting: Occupational Therapy

## 2023-12-05 ENCOUNTER — Ambulatory Visit

## 2023-12-10 ENCOUNTER — Ambulatory Visit: Admitting: Occupational Therapy

## 2023-12-10 ENCOUNTER — Ambulatory Visit: Admitting: Physical Therapy

## 2023-12-12 ENCOUNTER — Ambulatory Visit: Admitting: Occupational Therapy

## 2023-12-12 ENCOUNTER — Ambulatory Visit

## 2023-12-19 ENCOUNTER — Ambulatory Visit

## 2023-12-19 ENCOUNTER — Ambulatory Visit: Admitting: Occupational Therapy

## 2023-12-24 ENCOUNTER — Ambulatory Visit: Admitting: Physical Therapy

## 2023-12-24 ENCOUNTER — Ambulatory Visit: Admitting: Occupational Therapy

## 2023-12-26 ENCOUNTER — Ambulatory Visit: Admitting: Occupational Therapy

## 2023-12-26 ENCOUNTER — Ambulatory Visit

## 2023-12-31 ENCOUNTER — Ambulatory Visit: Admitting: Physical Therapy

## 2023-12-31 ENCOUNTER — Encounter: Admitting: Occupational Therapy

## 2024-01-02 ENCOUNTER — Ambulatory Visit

## 2024-01-02 ENCOUNTER — Ambulatory Visit: Admitting: Occupational Therapy

## 2024-01-07 ENCOUNTER — Ambulatory Visit

## 2024-01-07 ENCOUNTER — Ambulatory Visit: Admitting: Occupational Therapy

## 2024-01-09 ENCOUNTER — Ambulatory Visit: Admitting: Occupational Therapy

## 2024-01-09 ENCOUNTER — Ambulatory Visit

## 2024-01-14 ENCOUNTER — Ambulatory Visit: Admitting: Physical Therapy

## 2024-01-14 ENCOUNTER — Ambulatory Visit: Admitting: Occupational Therapy

## 2024-01-16 ENCOUNTER — Ambulatory Visit: Admitting: Occupational Therapy

## 2024-01-16 ENCOUNTER — Ambulatory Visit

## 2024-01-21 ENCOUNTER — Ambulatory Visit: Admitting: Occupational Therapy

## 2024-01-21 ENCOUNTER — Ambulatory Visit: Admitting: Physical Therapy

## 2024-01-23 ENCOUNTER — Ambulatory Visit

## 2024-01-23 ENCOUNTER — Ambulatory Visit: Admitting: Occupational Therapy

## 2024-01-28 ENCOUNTER — Ambulatory Visit: Admitting: Physical Therapy

## 2024-01-28 ENCOUNTER — Ambulatory Visit: Admitting: Occupational Therapy

## 2024-01-30 ENCOUNTER — Ambulatory Visit: Admitting: Occupational Therapy

## 2024-01-30 ENCOUNTER — Ambulatory Visit

## 2024-02-04 ENCOUNTER — Ambulatory Visit: Admitting: Physical Therapy

## 2024-02-04 ENCOUNTER — Ambulatory Visit: Admitting: Occupational Therapy

## 2024-02-06 ENCOUNTER — Ambulatory Visit

## 2024-02-06 ENCOUNTER — Ambulatory Visit: Admitting: Occupational Therapy

## 2024-02-11 ENCOUNTER — Ambulatory Visit: Admitting: Physical Therapy

## 2024-02-11 ENCOUNTER — Ambulatory Visit: Admitting: Occupational Therapy

## 2024-02-13 ENCOUNTER — Ambulatory Visit: Admitting: Occupational Therapy

## 2024-02-13 ENCOUNTER — Ambulatory Visit

## 2024-02-18 ENCOUNTER — Ambulatory Visit: Admitting: Occupational Therapy

## 2024-02-18 ENCOUNTER — Ambulatory Visit: Admitting: Physical Therapy

## 2024-02-20 ENCOUNTER — Ambulatory Visit: Admitting: Occupational Therapy

## 2024-02-20 ENCOUNTER — Ambulatory Visit

## 2024-02-25 ENCOUNTER — Ambulatory Visit: Admitting: Physical Therapy

## 2024-02-25 ENCOUNTER — Ambulatory Visit: Admitting: Occupational Therapy

## 2024-02-26 ENCOUNTER — Ambulatory Visit: Admitting: Physical Therapy

## 2024-02-27 ENCOUNTER — Ambulatory Visit: Admitting: Occupational Therapy

## 2024-02-27 ENCOUNTER — Ambulatory Visit

## 2024-03-03 ENCOUNTER — Ambulatory Visit: Admitting: Physical Therapy

## 2024-03-03 ENCOUNTER — Ambulatory Visit: Admitting: Occupational Therapy

## 2024-03-05 ENCOUNTER — Ambulatory Visit

## 2024-03-05 ENCOUNTER — Ambulatory Visit: Admitting: Occupational Therapy

## 2024-03-10 ENCOUNTER — Ambulatory Visit: Admitting: Occupational Therapy

## 2024-03-10 ENCOUNTER — Ambulatory Visit: Admitting: Physical Therapy

## 2024-03-12 ENCOUNTER — Ambulatory Visit

## 2024-03-12 ENCOUNTER — Ambulatory Visit: Admitting: Occupational Therapy

## 2024-03-17 ENCOUNTER — Ambulatory Visit: Admitting: Occupational Therapy

## 2024-03-17 ENCOUNTER — Ambulatory Visit: Admitting: Physical Therapy

## 2024-03-19 ENCOUNTER — Ambulatory Visit: Admitting: Occupational Therapy

## 2024-03-19 ENCOUNTER — Ambulatory Visit

## 2024-03-24 ENCOUNTER — Ambulatory Visit: Admitting: Physical Therapy

## 2024-03-24 ENCOUNTER — Ambulatory Visit: Admitting: Occupational Therapy

## 2024-03-26 ENCOUNTER — Ambulatory Visit: Admitting: Occupational Therapy

## 2024-03-26 ENCOUNTER — Ambulatory Visit

## 2024-03-26 ENCOUNTER — Telehealth: Payer: Self-pay

## 2024-03-26 ENCOUNTER — Telehealth (HOSPITAL_BASED_OUTPATIENT_CLINIC_OR_DEPARTMENT_OTHER): Payer: Self-pay | Admitting: *Deleted

## 2024-03-26 NOTE — Telephone Encounter (Signed)
° °  Name: Christine Chavez  DOB: Aug 14, 1980  MRN: [id number]  Primary Cardiologist: None   Preoperative team, please contact this patient and set up a phone call appointment for further preoperative risk assessment. Please obtain consent and complete medication review. Thank you for your help.  I confirm that guidance regarding antiplatelet and oral anticoagulation therapy has been completed and, if necessary, noted below.  Patient is on plavix  for a history of stroke. Not managed by cardiology. Recommendations on holding plavix  to be addressed by prescribing provider.   I also confirmed the patient resides in the state of [place] . As per [facility] Medical Board telemedicine laws, the patient must reside in the state in which the provider is licensed.   Rollo FABIENE Louder, PA-C 03/26/2024, 1:27 PM [facility]

## 2024-03-26 NOTE — Telephone Encounter (Signed)
° °  Pre-operative Risk Assessment    Patient Name: Christine Chavez  DOB: 20-Jun-1980 MRN: [id number]   Date of last office visit: 09/03/23 DR. GOLLAN Date of next office visit: NONE   Request for Surgical Clearance    Procedure:  LEFT MESSAGE TO CALL BACK TO CLARIFY PROCEDURE AS PROCEDURE IS LISTED ON PREOP FORM AS MAJOR PELVIC SURGERY  Date of Surgery:  Clearance 05/01/24                                Surgeon:  NOT LISTED Surgeon's Group or Practice Name:  [facility] C[facility] Phone number:  [phone] Fax number:  [phone]   Type of Clearance Requested:   - Medical  - Pharmacy:  Hold Clopidogrel  (Plavix )     Type of Anesthesia:  Not Indicated; ALSO NEED TYPE OF ANESTHESIA BEING USED    Additional requests/questions:    Signed, Cadince Hilscher   03/26/2024, 11:47 AM

## 2024-03-26 NOTE — Telephone Encounter (Signed)
 Med Rec and Consent done     Patient Consent for Virtual Visit        Christine Chavez has provided verbal consent on 03/26/2024 for a virtual visit (video or telephone).   CONSENT FOR VIRTUAL VISIT FOR:  Christine Chavez  By participating in this virtual visit I agree to the following:  I hereby voluntarily request, consent and authorize [facility] and its employed or contracted physicians, physician assistants, nurse practitioners or other licensed health care professionals (the Practitioner), to provide me with telemedicine health care services (the Services) as deemed necessary by the treating Practitioner. I acknowledge and consent to receive the Services by the Practitioner via telemedicine. I understand that the telemedicine visit will involve communicating with the Practitioner through live audiovisual communication technology and the disclosure of certain medical information by electronic transmission. I acknowledge that I have been given the opportunity to request an in-person assessment or other available alternative prior to the telemedicine visit and am voluntarily participating in the telemedicine visit.  I understand that I have the right to withhold or withdraw my consent to the use of telemedicine in the course of my care at any time, without affecting my right to future care or treatment, and that the Practitioner or I may terminate the telemedicine visit at any time. I understand that I have the right to inspect all information obtained and/or recorded in the course of the telemedicine visit and may receive copies of available information for a reasonable fee.  I understand that some of the potential risks of receiving the Services via telemedicine include:  Delay or interruption in medical evaluation due to technological equipment failure or disruption; Information transmitted may not be sufficient (e.g. poor resolution of images) to allow for appropriate  medical decision making by the Practitioner; and/or  In rare instances, security protocols could fail, causing a breach of personal health information.  Furthermore, I acknowledge that it is my responsibility to provide information about my medical history, conditions and care that is complete and accurate to the best of my ability. I acknowledge that Practitioner's advice, recommendations, and/or decision may be based on factors not within their control, such as incomplete or inaccurate data provided by me or distortions of diagnostic images or specimens that may result from electronic transmissions. I understand that the practice of medicine is not an exact science and that Practitioner makes no warranties or guarantees regarding treatment outcomes. I acknowledge that a copy of this consent can be made available to me via my patient portal [facility] MyChart), or I can request a printed copy by calling the office of [facility].    I understand that my insurance will be billed for this visit.   I have read or had this consent read to me. I understand the contents of this consent, which adequately explains the benefits and risks of the Services being provided via telemedicine.  I have been provided ample opportunity to ask questions regarding this consent and the Services and have had my questions answered to my satisfaction. I give my informed consent for the services to be provided through the use of telemedicine in my medical care

## 2024-03-26 NOTE — Telephone Encounter (Signed)
 [facility] to confirm procedure is-     Laparoscopic Bilateral Oophorectomy with Salpingectomy   Dr. Rocky Maffucci   150 minutes - total procedure time   Anesthesia: General

## 2024-03-26 NOTE — Telephone Encounter (Signed)
 S/W patient and scheduled TELE Preop appt for 04/01/24.  Med Rec and Consent done   Will update the surgeons office.

## 2024-03-31 ENCOUNTER — Encounter: Admitting: Occupational Therapy

## 2024-03-31 ENCOUNTER — Ambulatory Visit: Admitting: Physical Therapy

## 2024-04-01 ENCOUNTER — Ambulatory Visit: Attending: Internal Medicine | Admitting: Physician Assistant

## 2024-04-01 ENCOUNTER — Telehealth: Payer: Self-pay | Admitting: Cardiovascular Disease

## 2024-04-01 DIAGNOSIS — Z01810 Encounter for preprocedural cardiovascular examination: Secondary | ICD-10-CM

## 2024-04-01 NOTE — Telephone Encounter (Signed)
 Pt answered the call from the surgery office that she thought was the clearance call and so she missed her tele appt. Please advise.

## 2024-04-01 NOTE — Telephone Encounter (Signed)
 Please see last message about patient's appointment

## 2024-04-01 NOTE — Progress Notes (Signed)
 Virtual Visit via Telephone Note   Because of [name] co-morbid illnesses, she is at least at moderate risk for complications without adequate follow up.  This format is felt to be most appropriate for this patient at this time.  Due to technical limitations with video connection web designer), today's appointment will be conducted as an audio only telehealth visit, and [name] verbally agreed to proceed in this manner.   All issues noted in this document were discussed and addressed.  No physical exam could be performed with this format.  Evaluation Performed:  Preoperative cardiovascular risk assessment _____________   Date:  04/01/2024   Patient ID:  Christine Chavez, DOB Mar 27, 1981, MRN [id number] Patient Location:  Home Provider location:   Office  Primary Care Provider:  Johnie Perkins, PA-C Primary Cardiologist:  None  Chief Complaint / Patient Profile   43 y.o. y/o female with a h/o Paraplegia, small fiber neuropathy, recurrent UTIs, chronic lovenox  for history of DVT, multiple hospitalizations for infection, blood pressure swings, adrenal insufficiency, Stroke, Syncope (in the setting of adrenal insufficiency), Chronic pain on chronic opiates, Bladder removed who is pending Laparoscopic Bilateral Oophorectomy with Salpingectomy and presents today for telephonic preoperative cardiovascular risk assessment.  History of Present Illness    Christine Chavez is a 43 y.o. female who presents via audio/video conferencing for a telehealth visit today.  Pt was last seen in cardiology clinic on 09/03/23 by Dr. Gollan.  At that time [facility] was doing well.  The patient is now pending procedure as outlined above. Since her last visit, she has not had any new cardiac issues.  She does struggle with fluctuating blood pressure and heart rate but some of these incidences have been in the setting of infection or her known adrenal insufficiency.  She has had some  issues with a higher heart rate at times and she has an Apple watch that tracks her heart rhythm and rate.  She tells me about a time that her Apple watch went off in response to an anaphylactic issue with one of her medications.  Heart rate was high at this time.  She has also had a few syncopal events in the past 1 was in the setting of adrenal insufficiency.  Activity wise, she has a handicap assessable home and has 3 children that she cares for.  She tries to prepare meals as she is able.  She was recently in the hospital with an infection and she feels this has set her back from her normal energy level and her normal capabilities.  She is trying to do the best she can and make it to his many of her children sporting events that she can.  She also is able to do some household tasks from her wheelchair.  Patient is on plavix  for a history of stroke. Not managed by cardiology. Recommendations on holding plavix  to be addressed by prescribing provider. She is also on lovenox  injections and we have asked her to contact her primary for holding parameters.   Past Medical History    Past Medical History:  Diagnosis Date   Bladder retention    Complication of anesthesia    ? seizures after anesthesia    Depression    Diarrhea due to malabsorption    Dizziness    Headache    Migraines    Neurogenic bladder    Pyelonephritis    Renal disorder    Vision abnormalities    Weight loss  Past Surgical History:  Procedure Laterality Date   ANTERIOR CRUCIATE LIGAMENT REPAIR  1997   APPENDECTOMY     BLADDER REMOVAL     COLONOSCOPY WITH PROPOFOL  N/A 11/11/2018   Procedure: COLONOSCOPY WITH PROPOFOL ;  Surgeon: Gaylyn Gladis PENNER, MD;  Location: [facility] ENDOSCOPY;  Service: Endoscopy;  Laterality: N/A;   CYSTOSCOPY WITH STENT PLACEMENT Right 04/17/2016   Procedure: CYSTOSCOPY WITH STENT PLACEMENT;  Surgeon: Belvie LITTIE Clara, MD;  Location: [facility] ORS;  Service: Urology;  Laterality: Right;    ESOPHAGOGASTRODUODENOSCOPY (EGD) WITH PROPOFOL  N/A 11/11/2018   Procedure: ESOPHAGOGASTRODUODENOSCOPY (EGD) WITH PROPOFOL ;  Surgeon: Gaylyn Gladis PENNER, MD;  Location: [facility] ENDOSCOPY;  Service: Endoscopy;  Laterality: N/A;   EXPLORATORY LAPAROTOMY  1999   HIP SURGERY Right    KIDNEY STONE SURGERY  04/2016   REVISION UROSTOMY CUTANEOUS     REVISION UROSTOMY CUTANEOUS  01/10/2018   SUPRAPUBIC CATHETER PLACEMENT  08/2017   TONSILLECTOMY      Allergies  Allergies[1]  Home Medications    Prior to Admission medications  Medication Sig Start Date End Date Taking? Authorizing Provider  atorvastatin  (LIPITOR) 40 MG tablet Take 1 tablet (40 mg total) by mouth daily. 06/14/23   Awanda City, MD  buPROPion  (WELLBUTRIN  XL) 150 MG 24 hr tablet Take 150 mg by mouth daily.    [provider]  Cholecalciferol  (D 1000) 25 MCG (1000 UT) capsule Take 1,000 Units by mouth daily. 01/19/22   [provider]  clopidogrel  (PLAVIX ) 75 MG tablet Take 1 tablet (75 mg total) by mouth daily. 06/15/23   Awanda City, MD  Cyanocobalamin  (VITAMIN B-12) 2500 MCG SUBL Take 2,500 mcg by mouth daily.    [provider]  enoxaparin  (LOVENOX ) 80 MG/0.8ML injection Inject 0.8 mL (80 mg total) under the skin every twelve (12) hours. Inject in subcutaneous tissue, stomach about 2 inches away from belly button is typically easiest. 03/12/24 04/19/24  [provider]  EPINEPHrine 0.3 mg/0.3 mL IJ SOAJ injection Inject 0.3 mg into the muscle as needed for anaphylaxis. 08/31/20   [provider]  Fremanezumab-vfrm 225 MG/1.5ML SOAJ Inject 225 mg into the skin See admin instructions.    [provider]  hydrocortisone  (CORTEF ) 10 MG tablet Take 10 mg by mouth every morning.    [provider]  hydrocortisone  (CORTEF ) 5 MG tablet Take 5 mg by mouth every evening. Per pt, taken b/t 3 and 5 pm consistently Double dose per sick day dose 12/21/22   [provider]  HYDROmorphone   (DILAUDID ) 2 MG tablet Take 2 mg by mouth every 12 (twelve) hours as needed for moderate pain (pain score 4-6). 12/21/22   [provider]  hydrOXYzine  (ATARAX ) 25 MG tablet Take 25 mg by mouth 3 (three) times daily as needed for itching. 01/18/23   [provider]  linaclotide  (LINZESS ) 72 MCG capsule Take 72 mcg by mouth daily as needed. 01/19/22   [provider]  METHENAMINE HIPPURATE PO Take 1 g by mouth 2 (two) times daily.    [provider]  mirtazapine  (REMERON ) 15 MG tablet Take 15 mg by mouth at bedtime. 11/05/20   [provider]  naloxone [facility]) nasal spray 4 mg/0.1 mL Place 1 spray into the nose once as needed (Suspected Opiod Overdose). 02/02/22   [provider]  norethindrone  (AYGESTIN ) 5 MG tablet Take 5 mg by mouth daily.    [provider]  ondansetron  (ZOFRAN -ODT) 8 MG disintegrating tablet Take 8 mg by mouth every 6 (  six) hours as needed for nausea or vomiting. 05/27/23   [provider]  oxyCODONE  (OXY IR/ROXICODONE ) 5 MG immediate release tablet Take 5 mg by mouth. 03/21/24 04/20/24  [provider]  pantoprazole  (PROTONIX ) 40 MG tablet Take 40 mg by mouth 2 (two) times daily. 05/27/23   [provider]  pregabalin  (LYRICA ) 100 MG capsule Take 100 mg by mouth 2 (two) times daily. 02/14/19   [provider]  promethazine  (PHENERGAN ) 12.5 MG suppository Place 1 suppository (12.5 mg total) rectally every 6 (six) hours as needed for nausea or vomiting. 02/22/23   Lenon Marien CROME, MD  promethazine  (PHENERGAN ) 25 MG tablet Take 25 mg by mouth every 8 (eight) hours as needed for nausea or vomiting. 01/19/22   [provider]  SOLU-CORTEF  100 MG injection Inject 100 mg into the muscle once as needed (Adrenal Emergency). 12/21/22   [provider]  tamsulosin  (FLOMAX ) 0.4 MG CAPS capsule Take 0.4 mg by mouth 2 (two) times daily as needed. 05/14/23   [provider]   tiZANidine  (ZANAFLEX ) 2 MG tablet Take 2 mg by mouth as directed. Per pt, taken nightly, then as needed for breakthrough 12/19/20   [provider]  UBRELVY  50 MG TABS Take 50 mg by mouth daily as needed. 11/24/20   [provider]    Physical Exam    Vital Signs:  Daksha Koone does not have vital signs available for review today.  Given telephonic nature of communication, physical exam is limited. AAOx3. NAD. Normal affect.  Speech and respirations are unlabored.  Accessory Clinical Findings    None  Assessment & Plan    1.  Preoperative Cardiovascular Risk Assessment:  Ms. Blake perioperative risk of a major cardiac event is 6.6% according to the Revised Cardiac Risk Index (RCRI).  Therefore, she is at high risk for perioperative complications.   Her functional capacity is fair at 4.06 METs according to the Duke Activity Status Index (DASI). Recommendations: According to ACC/AHA guidelines, no further cardiovascular testing needed.  The patient may proceed to surgery at acceptable risk.    The patient was advised that if she develops new symptoms prior to surgery to contact our office to arrange for a follow-up visit, and she verbalized understanding.   A copy of this note will be routed to requesting surgeon.  Time:   Today, I have spent 30 minutes with the patient with telehealth technology discussing medical history, symptoms, and management plan.     Orren LOISE Fabry, PA-C  04/01/2024, 11:34 AM      [1]  Allergies Allergen Reactions   Aspirin Shortness Of Breath, Swelling and Anaphylaxis   Ibuprofen Shortness Of Breath, Swelling and Anaphylaxis   Ketorolac Anaphylaxis   Levofloxacin Other (See Comments)    Other Reaction(s): Arthralgias, Myalgias  Severe achilles tendon pain   Morphine  Hives    Itching as well. This has happened twice. Last witness w/ itchy hived all over pt's neck, check, torso, and abdomen.    Dilaudid  ok  Itching as  well. This has happened twice. Last witness w/ itchy hived all over pt's neck, check, torso, and abdomen.  Dilaudid  ok   Nsaids Anaphylaxis   Vibegron Anaphylaxis   Bupropion  Palpitations   Morphine  And Codeine Hives   Vancomycin Itching and Other (See Comments)    Other Reaction(s): Dizziness  Pt tolerates with Benadryl  pre-med  Pt tolerates with Benadryl  pre-med    Other Reaction(s): Dizziness  Pt tolerates with Benadryl  pre-med  Iron Dextran Hives    Developed LUE urticaria about 15 minutes into iron dextran infusion.   Methenamine Hippurate Rash   Prochlorperazine  Anxiety and Other (See Comments)    Out of body experience

## 2024-04-02 ENCOUNTER — Ambulatory Visit: Admitting: Occupational Therapy

## 2024-04-02 ENCOUNTER — Ambulatory Visit

## 2024-04-02 NOTE — Telephone Encounter (Signed)
 Attempted to contact the patient to inform her that the clearance was sent over to requested office yesterday following the tele appt with Tessa. Received to answer, [id number] if needed.

## 2024-04-02 NOTE — Telephone Encounter (Signed)
 Harlene with pts insurance calling to see if pt had phone appt and if clearance is ready. Please advise.   Callback [phone]

## 2024-04-14 ENCOUNTER — Other Ambulatory Visit: Payer: Self-pay | Admitting: Neurology

## 2024-04-14 DIAGNOSIS — G43109 Migraine with aura, not intractable, without status migrainosus: Secondary | ICD-10-CM

## 2024-05-30 ENCOUNTER — Ambulatory Visit
